# Patient Record
Sex: Male | Born: 1950
Health system: Southern US, Community
[De-identification: ages and names within clinical notes are randomized; demographics above are authoritative.]

## PROBLEM LIST (undated history)

## (undated) DIAGNOSIS — I5042 Chronic combined systolic (congestive) and diastolic (congestive) heart failure: Secondary | ICD-10-CM

## (undated) DIAGNOSIS — R399 Unspecified symptoms and signs involving the genitourinary system: Secondary | ICD-10-CM

## (undated) DIAGNOSIS — J449 Chronic obstructive pulmonary disease, unspecified: Secondary | ICD-10-CM

## (undated) DIAGNOSIS — H9190 Unspecified hearing loss, unspecified ear: Secondary | ICD-10-CM

## (undated) DIAGNOSIS — K219 Gastro-esophageal reflux disease without esophagitis: Secondary | ICD-10-CM

## (undated) DIAGNOSIS — Z9221 Personal history of antineoplastic chemotherapy: Secondary | ICD-10-CM

## (undated) DIAGNOSIS — I2510 Atherosclerotic heart disease of native coronary artery without angina pectoris: Secondary | ICD-10-CM

## (undated) DIAGNOSIS — E785 Hyperlipidemia, unspecified: Secondary | ICD-10-CM

## (undated) DIAGNOSIS — F1011 Alcohol abuse, in remission: Secondary | ICD-10-CM

## (undated) DIAGNOSIS — Z87898 Personal history of other specified conditions: Secondary | ICD-10-CM

## (undated) DIAGNOSIS — C159 Malignant neoplasm of esophagus, unspecified: Secondary | ICD-10-CM

## (undated) DIAGNOSIS — I10 Essential (primary) hypertension: Secondary | ICD-10-CM

## (undated) DIAGNOSIS — C61 Malignant neoplasm of prostate: Secondary | ICD-10-CM

## (undated) DIAGNOSIS — R51 Headache: Secondary | ICD-10-CM

## (undated) DIAGNOSIS — I255 Ischemic cardiomyopathy: Secondary | ICD-10-CM

## (undated) DIAGNOSIS — N281 Cyst of kidney, acquired: Secondary | ICD-10-CM

## (undated) DIAGNOSIS — R490 Dysphonia: Secondary | ICD-10-CM

## (undated) DIAGNOSIS — Z923 Personal history of irradiation: Secondary | ICD-10-CM

## (undated) DIAGNOSIS — R519 Headache, unspecified: Secondary | ICD-10-CM

---

## 1999-05-05 ENCOUNTER — Emergency Department (HOSPITAL_COMMUNITY): Admission: EM | Admit: 1999-05-05 | Discharge: 1999-05-05 | Payer: Self-pay | Admitting: Emergency Medicine

## 1999-05-05 ENCOUNTER — Encounter: Payer: Self-pay | Admitting: Emergency Medicine

## 1999-05-12 ENCOUNTER — Encounter: Admission: RE | Admit: 1999-05-12 | Discharge: 1999-05-12 | Payer: Self-pay | Admitting: Internal Medicine

## 1999-05-29 ENCOUNTER — Encounter: Payer: Self-pay | Admitting: Internal Medicine

## 1999-05-29 ENCOUNTER — Ambulatory Visit (HOSPITAL_COMMUNITY): Admission: RE | Admit: 1999-05-29 | Discharge: 1999-05-29 | Payer: Self-pay | Admitting: Internal Medicine

## 1999-05-29 ENCOUNTER — Encounter: Admission: RE | Admit: 1999-05-29 | Discharge: 1999-05-29 | Payer: Self-pay | Admitting: Internal Medicine

## 1999-09-01 DIAGNOSIS — Z87898 Personal history of other specified conditions: Secondary | ICD-10-CM

## 1999-09-01 HISTORY — DX: Personal history of other specified conditions: Z87.898

## 1999-10-03 ENCOUNTER — Encounter: Admission: RE | Admit: 1999-10-03 | Discharge: 1999-10-03 | Payer: Self-pay | Admitting: Internal Medicine

## 1999-11-03 ENCOUNTER — Encounter: Admission: RE | Admit: 1999-11-03 | Discharge: 1999-11-03 | Payer: Self-pay | Admitting: Internal Medicine

## 1999-11-17 ENCOUNTER — Encounter: Admission: RE | Admit: 1999-11-17 | Discharge: 1999-11-17 | Payer: Self-pay | Admitting: Internal Medicine

## 2000-08-26 ENCOUNTER — Encounter: Payer: Self-pay | Admitting: Emergency Medicine

## 2000-08-26 ENCOUNTER — Emergency Department (HOSPITAL_COMMUNITY): Admission: EM | Admit: 2000-08-26 | Discharge: 2000-08-26 | Payer: Self-pay | Admitting: Emergency Medicine

## 2000-09-14 ENCOUNTER — Emergency Department (HOSPITAL_COMMUNITY): Admission: EM | Admit: 2000-09-14 | Discharge: 2000-09-14 | Payer: Self-pay | Admitting: Emergency Medicine

## 2000-11-23 ENCOUNTER — Encounter: Payer: Self-pay | Admitting: Emergency Medicine

## 2000-11-23 ENCOUNTER — Inpatient Hospital Stay (HOSPITAL_COMMUNITY): Admission: EM | Admit: 2000-11-23 | Discharge: 2000-11-27 | Payer: Self-pay | Admitting: Emergency Medicine

## 2000-12-21 ENCOUNTER — Encounter: Admission: RE | Admit: 2000-12-21 | Discharge: 2000-12-21 | Payer: Self-pay | Admitting: Hematology and Oncology

## 2001-01-13 ENCOUNTER — Encounter: Admission: RE | Admit: 2001-01-13 | Discharge: 2001-01-13 | Payer: Self-pay | Admitting: Internal Medicine

## 2001-10-13 ENCOUNTER — Encounter: Payer: Self-pay | Admitting: Emergency Medicine

## 2001-10-13 ENCOUNTER — Inpatient Hospital Stay (HOSPITAL_COMMUNITY): Admission: EM | Admit: 2001-10-13 | Discharge: 2001-10-18 | Payer: Self-pay | Admitting: Emergency Medicine

## 2001-10-24 ENCOUNTER — Encounter: Admission: RE | Admit: 2001-10-24 | Discharge: 2001-10-24 | Payer: Self-pay

## 2001-12-09 ENCOUNTER — Encounter: Payer: Self-pay | Admitting: Internal Medicine

## 2001-12-09 ENCOUNTER — Inpatient Hospital Stay (HOSPITAL_COMMUNITY): Admission: RE | Admit: 2001-12-09 | Discharge: 2001-12-10 | Payer: Self-pay | Admitting: Internal Medicine

## 2001-12-09 ENCOUNTER — Encounter: Admission: RE | Admit: 2001-12-09 | Discharge: 2001-12-09 | Payer: Self-pay | Admitting: Internal Medicine

## 2001-12-09 ENCOUNTER — Ambulatory Visit (HOSPITAL_COMMUNITY): Admission: RE | Admit: 2001-12-09 | Discharge: 2001-12-09 | Payer: Self-pay | Admitting: Internal Medicine

## 2001-12-19 ENCOUNTER — Encounter: Admission: RE | Admit: 2001-12-19 | Discharge: 2001-12-19 | Payer: Self-pay | Admitting: Internal Medicine

## 2002-01-23 ENCOUNTER — Encounter: Admission: RE | Admit: 2002-01-23 | Discharge: 2002-01-23 | Payer: Self-pay | Admitting: Internal Medicine

## 2003-01-13 ENCOUNTER — Emergency Department (HOSPITAL_COMMUNITY): Admission: EM | Admit: 2003-01-13 | Discharge: 2003-01-13 | Payer: Self-pay

## 2003-02-02 ENCOUNTER — Encounter: Admission: RE | Admit: 2003-02-02 | Discharge: 2003-02-02 | Payer: Self-pay | Admitting: Internal Medicine

## 2003-04-30 ENCOUNTER — Emergency Department (HOSPITAL_COMMUNITY): Admission: EM | Admit: 2003-04-30 | Discharge: 2003-04-30 | Payer: Self-pay | Admitting: Emergency Medicine

## 2003-05-07 ENCOUNTER — Emergency Department (HOSPITAL_COMMUNITY): Admission: EM | Admit: 2003-05-07 | Discharge: 2003-05-07 | Payer: Self-pay | Admitting: Emergency Medicine

## 2003-10-15 ENCOUNTER — Emergency Department (HOSPITAL_COMMUNITY): Admission: EM | Admit: 2003-10-15 | Discharge: 2003-10-15 | Payer: Self-pay | Admitting: Emergency Medicine

## 2004-06-05 ENCOUNTER — Ambulatory Visit: Payer: Self-pay | Admitting: Internal Medicine

## 2004-06-12 ENCOUNTER — Ambulatory Visit: Payer: Self-pay | Admitting: Internal Medicine

## 2004-12-25 ENCOUNTER — Emergency Department (HOSPITAL_COMMUNITY): Admission: EM | Admit: 2004-12-25 | Discharge: 2004-12-25 | Payer: Self-pay | Admitting: Family Medicine

## 2005-01-23 ENCOUNTER — Emergency Department (HOSPITAL_COMMUNITY): Admission: EM | Admit: 2005-01-23 | Discharge: 2005-01-23 | Payer: Self-pay | Admitting: Emergency Medicine

## 2005-02-11 ENCOUNTER — Encounter: Admission: RE | Admit: 2005-02-11 | Discharge: 2005-02-11 | Payer: Self-pay | Admitting: Internal Medicine

## 2009-01-07 ENCOUNTER — Emergency Department (HOSPITAL_COMMUNITY): Admission: EM | Admit: 2009-01-07 | Discharge: 2009-01-07 | Payer: Self-pay | Admitting: Emergency Medicine

## 2009-04-25 ENCOUNTER — Encounter: Admission: RE | Admit: 2009-04-25 | Discharge: 2009-04-25 | Payer: Self-pay | Admitting: Internal Medicine

## 2009-05-02 ENCOUNTER — Encounter: Payer: Self-pay | Admitting: Gastroenterology

## 2009-05-07 ENCOUNTER — Encounter: Admission: RE | Admit: 2009-05-07 | Discharge: 2009-05-07 | Payer: Self-pay | Admitting: Gastroenterology

## 2009-05-14 ENCOUNTER — Encounter: Payer: Self-pay | Admitting: Gastroenterology

## 2009-05-14 ENCOUNTER — Telehealth (INDEPENDENT_AMBULATORY_CARE_PROVIDER_SITE_OTHER): Payer: Self-pay | Admitting: *Deleted

## 2009-05-14 DIAGNOSIS — R933 Abnormal findings on diagnostic imaging of other parts of digestive tract: Secondary | ICD-10-CM

## 2009-05-15 ENCOUNTER — Ambulatory Visit: Payer: Self-pay | Admitting: Gastroenterology

## 2009-05-15 ENCOUNTER — Ambulatory Visit (HOSPITAL_COMMUNITY): Admission: RE | Admit: 2009-05-15 | Discharge: 2009-05-15 | Payer: Self-pay | Admitting: Gastroenterology

## 2009-05-20 ENCOUNTER — Telehealth: Payer: Self-pay | Admitting: Gastroenterology

## 2009-05-22 ENCOUNTER — Ambulatory Visit: Payer: Self-pay | Admitting: Hematology & Oncology

## 2009-05-28 ENCOUNTER — Encounter: Payer: Self-pay | Admitting: Gastroenterology

## 2009-05-28 LAB — COMPREHENSIVE METABOLIC PANEL
AST: 24 U/L (ref 0–37)
Albumin: 4.6 g/dL (ref 3.5–5.2)
BUN: 17 mg/dL (ref 6–23)
Calcium: 10.1 mg/dL (ref 8.4–10.5)
Chloride: 98 mEq/L (ref 96–112)
Glucose, Bld: 104 mg/dL — ABNORMAL HIGH (ref 70–99)
Potassium: 5 mEq/L (ref 3.5–5.3)
Sodium: 136 mEq/L (ref 135–145)
Total Protein: 7.7 g/dL (ref 6.0–8.3)

## 2009-05-28 LAB — CBC WITH DIFFERENTIAL (CANCER CENTER ONLY)
BASO%: 1 % (ref 0.0–2.0)
Eosinophils Absolute: 0.1 10*3/uL (ref 0.0–0.5)
HCT: 42.3 % (ref 38.7–49.9)
LYMPH#: 1.6 10*3/uL (ref 0.9–3.3)
LYMPH%: 42.9 % (ref 14.0–48.0)
MCV: 87 fL (ref 82–98)
MONO#: 0.4 10*3/uL (ref 0.1–0.9)
NEUT%: 42.4 % (ref 40.0–80.0)
Platelets: 218 10*3/uL (ref 145–400)
RBC: 4.87 10*6/uL (ref 4.20–5.70)
WBC: 3.6 10*3/uL — ABNORMAL LOW (ref 4.0–10.0)

## 2009-05-30 ENCOUNTER — Ambulatory Visit: Admission: RE | Admit: 2009-05-30 | Discharge: 2009-07-21 | Payer: Self-pay | Admitting: Radiation Oncology

## 2009-05-30 ENCOUNTER — Ambulatory Visit (HOSPITAL_COMMUNITY): Admission: RE | Admit: 2009-05-30 | Discharge: 2009-05-30 | Payer: Self-pay | Admitting: Hematology & Oncology

## 2009-05-31 ENCOUNTER — Encounter: Payer: Self-pay | Admitting: Gastroenterology

## 2009-06-03 ENCOUNTER — Ambulatory Visit (HOSPITAL_COMMUNITY): Admission: RE | Admit: 2009-06-03 | Discharge: 2009-06-03 | Payer: Self-pay | Admitting: Hematology & Oncology

## 2009-06-13 LAB — CBC WITH DIFFERENTIAL (CANCER CENTER ONLY)
BASO%: 0.2 % (ref 0.0–2.0)
Eosinophils Absolute: 0 10*3/uL (ref 0.0–0.5)
MONO#: 0.3 10*3/uL (ref 0.1–0.9)
NEUT#: 1.2 10*3/uL — ABNORMAL LOW (ref 1.5–6.5)
Platelets: 191 10*3/uL (ref 145–400)
RBC: 4.84 10*6/uL (ref 4.20–5.70)
RDW: 11.7 % (ref 10.5–14.6)
WBC: 2.3 10*3/uL — ABNORMAL LOW (ref 4.0–10.0)

## 2009-06-13 LAB — COMPREHENSIVE METABOLIC PANEL
Alkaline Phosphatase: 40 U/L (ref 39–117)
BUN: 20 mg/dL (ref 6–23)
CO2: 28 mEq/L (ref 19–32)
Glucose, Bld: 102 mg/dL — ABNORMAL HIGH (ref 70–99)
Total Bilirubin: 0.6 mg/dL (ref 0.3–1.2)

## 2009-07-02 ENCOUNTER — Ambulatory Visit: Payer: Self-pay | Admitting: Hematology & Oncology

## 2009-07-03 LAB — CMP (CANCER CENTER ONLY)
ALT(SGPT): 21 U/L (ref 10–47)
AST: 22 U/L (ref 11–38)
Alkaline Phosphatase: 53 U/L (ref 26–84)
Sodium: 139 mEq/L (ref 128–145)
Total Bilirubin: 0.4 mg/dl (ref 0.20–1.60)
Total Protein: 7.2 g/dL (ref 6.4–8.1)

## 2009-07-03 LAB — CBC WITH DIFFERENTIAL (CANCER CENTER ONLY)
BASO%: 0.5 % (ref 0.0–2.0)
Eosinophils Absolute: 0.1 10*3/uL (ref 0.0–0.5)
LYMPH#: 0.3 10*3/uL — ABNORMAL LOW (ref 0.9–3.3)
LYMPH%: 12.1 % — ABNORMAL LOW (ref 14.0–48.0)
MCV: 85 fL (ref 82–98)
MONO#: 0.4 10*3/uL (ref 0.1–0.9)
NEUT#: 1.6 10*3/uL (ref 1.5–6.5)
Platelets: 236 10*3/uL (ref 145–400)
RBC: 4.51 10*6/uL (ref 4.20–5.70)
RDW: 12.8 % (ref 10.5–14.6)
WBC: 2.4 10*3/uL — ABNORMAL LOW (ref 4.0–10.0)

## 2009-07-08 ENCOUNTER — Ambulatory Visit: Payer: Self-pay | Admitting: Hematology

## 2009-08-01 ENCOUNTER — Ambulatory Visit: Payer: Self-pay | Admitting: Hematology & Oncology

## 2009-08-02 LAB — CBC WITH DIFFERENTIAL (CANCER CENTER ONLY)
BASO%: 1 % (ref 0.0–2.0)
EOS%: 0.8 % (ref 0.0–7.0)
Eosinophils Absolute: 0 10*3/uL (ref 0.0–0.5)
LYMPH%: 31.9 % (ref 14.0–48.0)
MCH: 29.9 pg (ref 28.0–33.4)
MCV: 86 fL (ref 82–98)
MONO%: 20 % — ABNORMAL HIGH (ref 0.0–13.0)
NEUT#: 2.4 10*3/uL (ref 1.5–6.5)
Platelets: 433 10*3/uL — ABNORMAL HIGH (ref 145–400)
RBC: 4.17 10*6/uL — ABNORMAL LOW (ref 4.20–5.70)
RDW: 14.3 % (ref 10.5–14.6)

## 2009-08-02 LAB — COMPREHENSIVE METABOLIC PANEL
ALT: 16 U/L (ref 0–53)
AST: 22 U/L (ref 0–37)
BUN: 19 mg/dL (ref 6–23)
Creatinine, Ser: 1.01 mg/dL (ref 0.40–1.50)
Total Bilirubin: 0.5 mg/dL (ref 0.3–1.2)

## 2009-08-02 LAB — TECHNOLOGIST REVIEW CHCC SATELLITE

## 2009-09-02 ENCOUNTER — Ambulatory Visit (HOSPITAL_COMMUNITY): Admission: RE | Admit: 2009-09-02 | Discharge: 2009-09-02 | Payer: Self-pay | Admitting: Hematology & Oncology

## 2009-09-17 ENCOUNTER — Ambulatory Visit: Payer: Self-pay | Admitting: Hematology & Oncology

## 2009-09-18 LAB — COMPREHENSIVE METABOLIC PANEL
AST: 26 U/L (ref 0–37)
Calcium: 9.5 mg/dL (ref 8.4–10.5)
Chloride: 95 mEq/L — ABNORMAL LOW (ref 96–112)
Creatinine, Ser: 0.95 mg/dL (ref 0.40–1.50)

## 2009-09-18 LAB — PREALBUMIN: Prealbumin: 15 mg/dL — ABNORMAL LOW (ref 18.0–45.0)

## 2009-09-18 LAB — CBC WITH DIFFERENTIAL (CANCER CENTER ONLY)
BASO#: 0 10*3/uL (ref 0.0–0.2)
BASO%: 0.4 % (ref 0.0–2.0)
EOS%: 3.8 % (ref 0.0–7.0)
Eosinophils Absolute: 0.1 10*3/uL (ref 0.0–0.5)
HCT: 40.5 % (ref 38.7–49.9)
HGB: 13.7 g/dL (ref 13.0–17.1)
MCV: 91 fL (ref 82–98)
MONO%: 14.1 % — ABNORMAL HIGH (ref 0.0–13.0)
NEUT#: 1.7 10*3/uL (ref 1.5–6.5)
RBC: 4.47 10*6/uL (ref 4.20–5.70)
WBC: 3.5 10*3/uL — ABNORMAL LOW (ref 4.0–10.0)

## 2009-10-04 ENCOUNTER — Ambulatory Visit: Payer: Self-pay | Admitting: Thoracic Surgery

## 2009-10-08 ENCOUNTER — Ambulatory Visit: Admission: RE | Admit: 2009-10-08 | Discharge: 2009-10-08 | Payer: Self-pay | Admitting: Thoracic Surgery

## 2009-10-08 ENCOUNTER — Telehealth (INDEPENDENT_AMBULATORY_CARE_PROVIDER_SITE_OTHER): Payer: Self-pay

## 2009-10-09 ENCOUNTER — Ambulatory Visit: Payer: Self-pay

## 2009-10-09 ENCOUNTER — Ambulatory Visit: Payer: Self-pay | Admitting: Cardiovascular Disease

## 2009-10-09 ENCOUNTER — Encounter (HOSPITAL_COMMUNITY): Admission: RE | Admit: 2009-10-09 | Discharge: 2009-12-04 | Payer: Self-pay | Admitting: Thoracic Surgery

## 2009-10-09 HISTORY — PX: CARDIOVASCULAR STRESS TEST: SHX262

## 2009-10-11 ENCOUNTER — Ambulatory Visit: Payer: Self-pay | Admitting: Thoracic Surgery

## 2009-10-11 ENCOUNTER — Telehealth (INDEPENDENT_AMBULATORY_CARE_PROVIDER_SITE_OTHER): Payer: Self-pay | Admitting: *Deleted

## 2009-10-18 ENCOUNTER — Ambulatory Visit: Payer: Self-pay | Admitting: Thoracic Surgery

## 2009-11-06 ENCOUNTER — Ambulatory Visit: Payer: Self-pay | Admitting: Hematology & Oncology

## 2009-11-07 LAB — COMPREHENSIVE METABOLIC PANEL
AST: 23 U/L (ref 0–37)
Albumin: 4.4 g/dL (ref 3.5–5.2)
Alkaline Phosphatase: 49 U/L (ref 39–117)
CO2: 27 mEq/L (ref 19–32)
Calcium: 9.7 mg/dL (ref 8.4–10.5)
Chloride: 96 mEq/L (ref 96–112)
Creatinine, Ser: 1.19 mg/dL (ref 0.40–1.50)
Glucose, Bld: 93 mg/dL (ref 70–99)
Sodium: 134 mEq/L — ABNORMAL LOW (ref 135–145)
Total Protein: 7.1 g/dL (ref 6.0–8.3)

## 2009-11-07 LAB — CBC WITH DIFFERENTIAL (CANCER CENTER ONLY)
BASO#: 0 10*3/uL (ref 0.0–0.2)
BASO%: 0.4 % (ref 0.0–2.0)
EOS%: 2.4 % (ref 0.0–7.0)
HGB: 14.2 g/dL (ref 13.0–17.1)
LYMPH#: 1.2 10*3/uL (ref 0.9–3.3)
MCV: 90 fL (ref 82–98)
MONO#: 0.2 10*3/uL (ref 0.1–0.9)
RDW: 11.1 % (ref 10.5–14.6)
WBC: 2.4 10*3/uL — ABNORMAL LOW (ref 4.0–10.0)

## 2010-01-03 ENCOUNTER — Ambulatory Visit (HOSPITAL_COMMUNITY): Admission: RE | Admit: 2010-01-03 | Discharge: 2010-01-03 | Payer: Self-pay | Admitting: Hematology & Oncology

## 2010-01-08 ENCOUNTER — Ambulatory Visit: Payer: Self-pay | Admitting: Hematology & Oncology

## 2010-01-09 LAB — COMPREHENSIVE METABOLIC PANEL
ALT: 24 U/L (ref 0–53)
AST: 24 U/L (ref 0–37)
Alkaline Phosphatase: 37 U/L — ABNORMAL LOW (ref 39–117)
CO2: 28 mEq/L (ref 19–32)
Chloride: 100 mEq/L (ref 96–112)
Creatinine, Ser: 1.17 mg/dL (ref 0.40–1.50)
Glucose, Bld: 88 mg/dL (ref 70–99)
Potassium: 4.6 mEq/L (ref 3.5–5.3)
Sodium: 138 mEq/L (ref 135–145)
Total Bilirubin: 0.3 mg/dL (ref 0.3–1.2)

## 2010-01-09 LAB — CBC WITH DIFFERENTIAL (CANCER CENTER ONLY)
BASO#: 0 10*3/uL (ref 0.0–0.2)
EOS%: 2.9 % (ref 0.0–7.0)
Eosinophils Absolute: 0.1 10*3/uL (ref 0.0–0.5)
HCT: 41.3 % (ref 38.7–49.9)
HGB: 13.7 g/dL (ref 13.0–17.1)
MCHC: 33.2 g/dL (ref 32.0–35.9)
MCV: 87 fL (ref 82–98)
NEUT%: 50.7 % (ref 40.0–80.0)
Platelets: 199 10*3/uL (ref 145–400)
WBC: 2.8 10*3/uL — ABNORMAL LOW (ref 4.0–10.0)

## 2010-01-10 ENCOUNTER — Encounter: Admission: RE | Admit: 2010-01-10 | Discharge: 2010-01-10 | Payer: Self-pay | Admitting: Thoracic Surgery

## 2010-01-10 ENCOUNTER — Ambulatory Visit: Payer: Self-pay | Admitting: Thoracic Surgery

## 2010-04-17 ENCOUNTER — Ambulatory Visit (HOSPITAL_COMMUNITY): Admission: RE | Admit: 2010-04-17 | Discharge: 2010-04-17 | Payer: Self-pay | Admitting: Hematology & Oncology

## 2010-04-23 ENCOUNTER — Ambulatory Visit: Payer: Self-pay | Admitting: Hematology & Oncology

## 2010-04-25 LAB — COMPREHENSIVE METABOLIC PANEL
ALT: 22 U/L (ref 0–53)
Alkaline Phosphatase: 32 U/L — ABNORMAL LOW (ref 39–117)
CO2: 30 mEq/L (ref 19–32)
Total Protein: 7.2 g/dL (ref 6.0–8.3)

## 2010-04-25 LAB — CBC WITH DIFFERENTIAL (CANCER CENTER ONLY)
BASO#: 0 10*3/uL (ref 0.0–0.2)
Eosinophils Absolute: 0.1 10*3/uL (ref 0.0–0.5)
HGB: 14.1 g/dL (ref 13.0–17.1)
MCH: 29.5 pg (ref 28.0–33.4)
MCHC: 33.1 g/dL (ref 32.0–35.9)
NEUT#: 0.9 10*3/uL — ABNORMAL LOW (ref 1.5–6.5)
NEUT%: 35 % — ABNORMAL LOW (ref 40.0–80.0)
Platelets: 212 10*3/uL (ref 145–400)
RBC: 4.79 10*6/uL (ref 4.20–5.70)
RDW: 11.5 % (ref 10.5–14.6)
WBC: 2.4 10*3/uL — ABNORMAL LOW (ref 4.0–10.0)

## 2010-04-29 ENCOUNTER — Ambulatory Visit: Payer: Self-pay | Admitting: Thoracic Surgery

## 2010-07-18 ENCOUNTER — Encounter: Payer: Self-pay | Admitting: Gastroenterology

## 2010-08-08 ENCOUNTER — Ambulatory Visit (HOSPITAL_COMMUNITY)
Admission: RE | Admit: 2010-08-08 | Discharge: 2010-08-08 | Payer: Self-pay | Source: Home / Self Care | Attending: Hematology & Oncology | Admitting: Hematology & Oncology

## 2010-08-14 ENCOUNTER — Ambulatory Visit: Payer: Self-pay | Admitting: Hematology & Oncology

## 2010-08-15 LAB — COMPREHENSIVE METABOLIC PANEL
AST: 27 U/L (ref 0–37)
Albumin: 4.1 g/dL (ref 3.5–5.2)
Alkaline Phosphatase: 27 U/L — ABNORMAL LOW (ref 39–117)
Potassium: 4.3 mEq/L (ref 3.5–5.3)
Sodium: 136 mEq/L (ref 135–145)
Total Protein: 6.8 g/dL (ref 6.0–8.3)

## 2010-08-15 LAB — CBC WITH DIFFERENTIAL (CANCER CENTER ONLY)
BASO#: 0 10*3/uL (ref 0.0–0.2)
EOS%: 4.6 % (ref 0.0–7.0)
Eosinophils Absolute: 0.1 10*3/uL (ref 0.0–0.5)
LYMPH%: 38.1 % (ref 14.0–48.0)
MCH: 29 pg (ref 28.0–33.4)
MONO#: 0.5 10*3/uL (ref 0.1–0.9)

## 2010-08-15 LAB — TECHNOLOGIST REVIEW CHCC SATELLITE

## 2010-08-20 ENCOUNTER — Ambulatory Visit (HOSPITAL_COMMUNITY)
Admission: RE | Admit: 2010-08-20 | Discharge: 2010-08-20 | Payer: Self-pay | Source: Home / Self Care | Attending: Hematology & Oncology | Admitting: Hematology & Oncology

## 2010-09-21 ENCOUNTER — Encounter: Payer: Self-pay | Admitting: Internal Medicine

## 2010-09-30 NOTE — Assessment & Plan Note (Signed)
Summary: Cardiology Nuclear Study  Nuclear Med Background Indications for Stress Test: Evaluation for Ischemia, Surgical Clearance  Indications Comments: Pending Total Esophagectomy(CA) by Dr. Norton Blizzard.  History: History of Chemo  History Comments: NO DOCUMENTED CAD  Symptoms: Chest Pain  Symptoms Comments: Last episode of CP:3-4 months ago, when "I quit smoking"   Nuclear Pre-Procedure Cardiac Risk Factors: History of Smoking, Hypertension, Lipids Caffeine/Decaff Intake: None NPO After: 8:30 AM Lungs: Clear IV 0.9% NS with Angio Cath: 22g     IV Site: (R) AC IV Started by: Irean Hong RN Chest Size (in) 36     Height (in): 66 Weight (lb): 132 BMI: 21.38 Tech Comments: The patient very active, works in [employer], and wants to walk treadmill.   Patsy Edwards,RN. AM Meds. taken today.  Nuclear Med Study 1 or 2 day study:  1 day     Stress Test Type:  Stress Reading MD:  Charlton Haws, MD     Referring MD:  Karle Plumber, MD Resting Radionuclide:  Technetium 63m Tetrofosmin     Resting Radionuclide Dose:  11.0 mCi  Stress Radionuclide:  Technetium 69m Tetrofosmin     Stress Radionuclide Dose:  33.0 mCi   Stress Protocol Exercise Time (min):  9:31 min     Max HR:  142 bpm     Predicted Max HR:  162 bpm  Max Systolic BP: 211 mm Hg     Percent Max HR:  87.65 %     METS: 10.6 Rate Pressure Product:  [address]    Stress Test Technologist:  Rea [employer] CMA-N     Nuclear Technologist:  Burna Mortimer Deal RT-N  Rest Procedure  Myocardial perfusion imaging was performed at rest 45 minutes following the intravenous administration of Myoview Technetium 82m Tetrofosmin.  Stress Procedure  The patient exercised for 9:31.  The patient stopped due to fatigue and a hypertensive response, 211/108.  He denied any chest pain.  There were no significant ST-T wave changes, only occasional PVC's and PAC's.  Myoview was injected at peak exercise and myocardial perfusion imaging was performed  after a brief delay.  QPS Raw Data Images:  Normal; no motion artifact; normal heart/lung ratio. Stress Images:  NI: Uniform and normal uptake of tracer in all myocardial segments. Rest Images:  Normal homogeneous uptake in all areas of the myocardium. Subtraction (SDS):  Normal Transient Ischemic Dilatation:  1.05  (Normal <1.22)  Lung/Heart Ratio:  .31  (Normal <0.45)  Quantitative Gated Spect Images QGS EDV:  80 ml QGS ESV:  35 ml QGS EF:  57 % QGS cine images:  normal  Findings Normal nuclear study      Overall Impression  Exercise Capacity: Good exercise capacity. BP Response: Hypertensive blood pressure response. Clinical Symptoms: No chest pain ECG Impression: No significant ST segment change suggestive of ischemia. Overall Impression: Normal stress nuclear study. Overall Impression Comments: Hypertensive response to exercise

## 2010-09-30 NOTE — Progress Notes (Signed)
Summary: Nuc. Pre-Procedure  Phone Note Outgoing Call Call back at Work Phone [phone]   Call placed by: Irean Hong, RN,  October 08, 2009 2:06 PM Summary of Call: Reviewed information on Myoview Information Sheet (see scanned document for further details).  Spoke with patient.     Nuclear Med Background Indications for Stress Test: Evaluation for Ischemia, Surgical Clearance  Indications Comments: Pending Total Esophagectomy(CA) by Dr. Norton Blizzard.  History: History of Chemo      Nuclear Pre-Procedure Cardiac Risk Factors: History of Smoking, Hypertension, Lipids

## 2010-09-30 NOTE — Progress Notes (Signed)
  Phone Note From Other Clinic   Caller: Joleen/Dr.Burney Details for Reason: Pt.Information Initial call taken by: Marijean Niemann Stress over to [phone] [facility]  October 11, 2009 3:52 PM

## 2010-09-30 NOTE — Letter (Signed)
Summary: [facility]  [facility]   Imported By: Sherian Rein 07/31/2010 14:47:27  _____________________________________________________________________  External Attachment:    Type:   Image     Comment:   External Document

## 2010-11-16 LAB — GLUCOSE, CAPILLARY: Glucose-Capillary: 94 mg/dL (ref 70–99)

## 2010-12-18 ENCOUNTER — Encounter (HOSPITAL_BASED_OUTPATIENT_CLINIC_OR_DEPARTMENT_OTHER): Payer: Medicaid Other | Admitting: Hematology & Oncology

## 2010-12-18 ENCOUNTER — Other Ambulatory Visit: Payer: Self-pay | Admitting: Hematology & Oncology

## 2010-12-18 DIAGNOSIS — C154 Malignant neoplasm of middle third of esophagus: Secondary | ICD-10-CM

## 2010-12-18 LAB — CBC WITH DIFFERENTIAL (CANCER CENTER ONLY)
Eosinophils Absolute: 0.2 10*3/uL (ref 0.0–0.5)
LYMPH#: 1.2 10*3/uL (ref 0.9–3.3)
LYMPH%: 38.8 % (ref 14.0–48.0)
MCV: 85 fL (ref 82–98)
MONO#: 0.5 10*3/uL (ref 0.1–0.9)
Platelets: 181 10*3/uL (ref 145–400)
RBC: 4.94 10*6/uL (ref 4.20–5.70)
WBC: 3.2 10*3/uL — ABNORMAL LOW (ref 4.0–10.0)

## 2010-12-18 LAB — COMPREHENSIVE METABOLIC PANEL
Albumin: 4.3 g/dL (ref 3.5–5.2)
CO2: 27 mEq/L (ref 19–32)
Calcium: 9.9 mg/dL (ref 8.4–10.5)
Chloride: 100 mEq/L (ref 96–112)
Glucose, Bld: 92 mg/dL (ref 70–99)
Potassium: 4.4 mEq/L (ref 3.5–5.3)
Sodium: 137 mEq/L (ref 135–145)
Total Bilirubin: 0.3 mg/dL (ref 0.3–1.2)
Total Protein: 7 g/dL (ref 6.0–8.3)

## 2010-12-18 LAB — LACTATE DEHYDROGENASE: LDH: 196 U/L (ref 94–250)

## 2011-01-13 NOTE — Letter (Signed)
October 18, 2009   Rose Phi. Myna Hidalgo, MD  [address] [facility] [address]  [place], [place] [address]   Re:  Collin Young, Collin Young              DOB:  [birth year]   Dear Theron Arista,   I saw the patient back today after he had a repeat endoscopy by Dr.  Randa Evens, now it is completely negative with no evidence of recurrence of  the cancer.  We talked about him at cancer conference and the consensus  was probably should go ahead and resect him given that he had only  received 5400 Gy of radiation rather than the normal 6000-7000 Gy.  I  discussed this with him in detail, and the patient after discussing with  surgery he is really reluctant to have the surgery.  He actual will call  you and discuss this with you.  His blood pressure was 165/94, pulse of  86, respirations 18, sats were 98%.  Given that he is a little past our  normal window regarding the surgery, may be just observation is the way  to go.  I have rescheduled him to see me again in 3 months with a CT  scan but will be happy to see him again before then.   Sincerely,   [name], M.D.  Electronically Signed   DPB/MEDQ  D:  10/18/2009  T:  10/19/2009  Job:  [id number]   cc:   Fayrene Fearing L. Malon Kindle., M.D.

## 2011-01-13 NOTE — Letter (Signed)
April 29, 2010   Rose Phi. Myna Hidalgo, MD  [address] [facility] [address]  [place], [place] [address]   Re:  RISHAWN, WALCK              DOB:  1950/10/07   Dear Theron Arista:   I saw the patient back today and reviewed his PET scan, which looks like  he has no evidence of recurrence of his cancer.  So far, he has got a  good response to the radiation and chemotherapy.  I will let you  continue to follow him.  If I need to see him again, I will be happy to  see him at anytime.  I appreciate the opportunity of participating in  the patient's care.  His blood pressure was 123/76, pulse 87,  respirations 18, and sats were 98%.   [name], M.D.  Electronically Signed   DPB/MEDQ  D:  04/29/2010  T:  04/30/2010  Job:  [id number]

## 2011-01-13 NOTE — Assessment & Plan Note (Signed)
OFFICE VISIT   Collin Young, Collin Young  DOB:  1950/11/25                                        October 11, 2009  CHART #:  [id number]   The patient came for followup today and his Cardiolite was negative.  His pulmonary function tests look good from a standpoint of infection.  The problem is he did not get his endoscopy done by Dr. Randa Evens due to  some miscommunication.  We will plan to get that done and then I will  see him back again.  His blood pressure is 122/74, pulse 88,  respirations 16, sats were 98%.   [name], M.D.  Electronically Signed   DPB/MEDQ  D:  10/11/2009  T:  10/12/2009  Job:  [id number]

## 2011-01-13 NOTE — Letter (Signed)
Jan 10, 2010   Rose Phi. Myna Hidalgo, MD  [address] [facility] [address]  [place], [place] [address]   Re:  [name], [name]              DOB:  1951/05/07   Dear Cindee Lame,   I saw the patient back today and I found that there is no evidence of  recurrence of his cancer on his CT scan.  We will plan to get a PET scan  on him in August.  His blood pressure is 153/85, pulse 59, respirations  18, sats were 98%.  I will see him back again at that time.   [name], M.D.  Electronically Signed   DPB/MEDQ  D:  01/10/2010  T:  01/11/2010  Job:  [id number]

## 2011-01-13 NOTE — Letter (Signed)
October 04, 2009   Rose Phi. Myna Hidalgo, MD  [address] [facility] [address]  [place], [place] [address]   Re:  RESHAD, SAAB              DOB:  03/26/1951   Dear Theron Arista:   I saw the patient today.  This patient was found in May 09, 2009,  was admitted with weight loss and dysphagia and underwent an endoscopy  by Dr. 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  A PET scan was positive in this area as well two 4L nodes.  He had an ultrasound by Dr. Christella Hartigan, did show this was 3 x 4 x 1.1 cm  that invaded the muscularis propria, so he was then referred and  underwent radiation and chemotherapy, which finished about 2 weeks ago.  He had a followup PET scan, which showed no activity in the  paraesophageal node, but there is still some mild activity in the mid  esophagus.  A PET scan was done approximately 1 month ago.  He is eating  well at the present time.   PAST MEDICAL HISTORY:  Significant for hypertension and hyperlipidemia.   ALLERGIES:  He has no allergies.   MEDICATIONS:  1. Lisinopril 12.5 mg daily.  2. Zantac 150 mg daily.  3. Simvastatin 20 mg daily.   FAMILY HISTORY:  Noncontributory.  It is positive for cancer.   SOCIAL HISTORY:  He is married.  He has 2 children.  Smoked for many  years, but quit smoking a couple of months ago.  Does not drink alcohol  on a regular basis.   REVIEW OF SYSTEMS:  VITAL SIGNS:  He is 136 pounds.  He is 5 feet 6  inches.  GENERAL:  His weight has been stable since completing his  treatment.  CARDIAC:  No angina or atrial fibrillation.  PULMONARY:  No  hemoptysis, cough, or asthma.  GI:  See history of present illness.  GU:  No kidney disease, dysuria, or frequent urination.  VASCULAR:  No  claudication, DVT, or TIAs.  NEUROLOGICAL:  No dizziness, headaches,  blackouts, or seizures.  MUSCULOSKELETAL:  No arthritis.  PSYCHIATRIC:  No depression or nervousness.  EYE/ENT:  No changes in eyesight or  hearing.   HEMATOLOGICAL:  No problems with bleeding, clotting disorders,  or anemia.   PHYSICAL EXAMINATION:  General:  He is a well-developed [place] American  male, in no acute distress.  Head, Eyes, Ears, Nose, and Throat:  Unremarkable.  Neck:  Supple without thyromegaly.  There is no  supraclavicular or axillary adenopathy.  Chest:  Clear to auscultation  and percussion.  Heart:  Regular sinus rhythm.  No murmurs.  Abdomen:  Soft.  There is no hepatosplenomegaly.  Extremities:  Pulses are 2+.  There is no clubbing or edema.  Neurological:  He is oriented x3.  Sensory and motor intact.  Cranial nerves intact.   Unfortunately, he is now over 2 months since completing his radiation  and chemotherapy, which is usually out of the REM when we like to  operate on them.  It is usually better to do this within 4-6 weeks post  treatment.  I discussed today the risk and benefits of surgery, saying  that he did have to have a total esophagectomy and then his stomach, we  used a conduit brought to his left neck.  I have explained to him that  there is a high possibility of recurrent cancer  with or without the  surgery.  He will discuss this with his family.  I will plan to get a  Cardiolite, pulmonary function test, and have Dr. Randa Evens rescope him.  I will see him back again after this has been done and then make a final  decision whether surgery would be beneficial or not.   Sincerely,   [name], M.D.  Electronically Signed   DPB/MEDQ  D:  10/04/2009  T:  10/05/2009  Job:  [id number]

## 2011-01-16 NOTE — Consult Note (Signed)
[place]. [facility]  Patient:    Collin Young, Collin Young Visit Number: [id number] MRN: [id number]          Service Type: MED Location: MICU 2110 01 Attending Physician:  Madaline Guthrie Dictated by:   Raynelle Jan Proc. Date: 10/13/01 Admit Date:  10/13/2001   CC:         Madaline Guthrie, M.D.  Genene Churn. Sherin Quarry, M.D.   Consultation Report  HISTORY OF PRESENT ILLNESS:  We have been asked to consult on this 60 year old gentleman with a history of a previous GI bleed secondary to Mallory-Weiss tear in March 2002, who presents to [facility] Emergency Department with a 1-day history of black, bloody, tarry stools and a single episode of hematemesis. The patient states that his stools started last evening and have totaled approximately 7 episodes in the last 12 hours. He complains of no abdominal pain or worsening GERD-type symptoms. He does have history of significant alcohol abuse in the past and does admit to recent alcohol use. He also notes a recent use of over-the-counter Alka Seltzer Plus containing an NSAID product. He states he has had some dysphagia symptoms x3 weeks as well as some occasional substernal and left-sided chest pain that occurs after coughing. He denies any recent nausea or vomiting. He denies any other recent GI symptoms.  PAST MEDICAL HISTORY: 1. GI bleed March 2002 secondary to Mallory-Weiss tear. 2. Polysubstance abuse with a history of DTs during his last admission. 3. Hypertension.  MEDICATIONS:  Include "blood pressure pills" which he has been out of for months.  ALLERGIES:  No known drug allergies.  PAST SURGICAL HISTORY:  None.  FAMILY HISTORY:  No history of any GI malignancies, peptic ulcer disease, or inflammatory bowel disorders. His mother does have a history of hypertension.  SOCIAL HISTORY:  Positive alcohol, tobacco and drug use, however, the patient denies any current drug use. He is employed in [employer], site.  REVIEW OF SYSTEMS:  Significant for a cough over the last 3 weeks, productive of yellow mucus. No fevers or chills.  PHYSICAL EXAMINATION:  VITAL SIGNS:  Temperature 97.9, blood pressure 158/105, pulse 90 and regular, respirations 20.  GENERAL:  He is alert and oriented x4.  HEENT:  The sclerae are slightly muddy in appearance. An NG tube is in place with bright red blood in the tubing. Mucus membranes are moist. Neck is without JVD or lymphadenopathy.  CARDIOVASCULAR:  Normal S1, S2. Regular rate and rhythm. No murmurs.  LUNGS:  Clear to auscultation bilaterally.  ABDOMEN:  Soft, nontender, nondistended. Positive bowel sounds. No appreciable hepatosplenomegaly or masses noted on examination.  RECTAL:  Significant for black, tarry, heme-positive stools per the ED physician.  EXTREMITIES:  No clubbing, cyanosis, or edema.  LABS ON ADMISSION:  CBC shows a hemoglobin of 14.2 and 40.3. On repeat performed 4 hours later, hemoglobin had dropped to 11.9. PT 13.0, INR 1.0, PTT 28, amylase was 133, lipase and CMP was pending. Cardiac enzymes shows a total CK of 343, MB fraction of 2.8, index of 0.8, troponin 0.01. Urine drug screen is negative. Serum alcohol less than 5.  EKG shows a normal sinus rhythm with occasional PVCs and a prolonged QT.  Chest x-ray is currently pending.  ASSESSMENT AND PLAN:  This is a 60 year old male with a history of alcohol abuse and previous GI bleed secondary to a Mallory-Weiss tear who presents to the emergency department with a likely repeat upper gastrointestinal bleed. Will stabilize the  patient with intravenous fluids, blood if his hemoglobin continues to drop. Will plan on endoscopy later today; is already on a proton pump inhibitor. We will encourage alcohol abstinence during our hospitalization. Will follow up on his LFT results to ensure no liver dysfunction from his alcohol abuse. Dictated by:   Raynelle Jan Attending  Physician:  Madaline Guthrie DD:  10/13/01 TD:  10/13/01 Job: [id number]

## 2011-01-16 NOTE — Discharge Summary (Signed)
[place]. [facility]  Patient:    Collin Young, Collin Young Visit Number: [id number] MRN: [id number]          Service Type: MED Location: 2000 2010 01 Attending Physician:  Ronn Melena Dictated by:   Hillery Aldo, M.D. Admit Date:  12/09/2001 Disc. Date: 12/10/01                             Discharge Summary  DISCHARGE DIAGNOSES: 1. Chest pain, noncardiac. 2. Anemia secondary to history of Mallory-Weiss tears. 3. History of alcohol abuse, no longer uses. 4. Hypertension. 5. Mild elevation of liver transaminases. 6. Elevated total protein.  DISCHARGE MEDICATIONS: 1. Hydrochlorothiazide 25 mg p.o. q.d. 2. Altace 2.5 mg p.o. q.d. 3. Iron sulfate 325 mg p.o. t.i.d. with meals.  DIAGNOSTIC STUDIES: 1. Chest x-ray on December 09, 2001, no active disease, no infiltrates, fractures    or pneumothorax, slightly hyperaerated. 2. Electrocardiogram on December 09, 2001, sinus bradycardia at 48 beats per    minute, prominent Q wave in lead III, small Q wave in lead aVF.  No ST    segment abnormalities.  DISPOSITION:  Discharged to home.  CONDITION ON DISCHARGE:  Stable.  FOLLOWUP:  He is to call the clinic at [phone], for an appointment to be seen by Dr. Darnelle Catalan in two to three weeks on Monday.  At that time, Dr. Darnelle Catalan will schedule Mr. Kam to undergo a stress Cardiolite as well as follow up with a comprehensive metabolic panel to determine if his total protein is still elevated with a normal albumin.  If so, a workup will be undertaken including a UPEP and SPEP to rule out myeloma.  HISTORY OF PRESENT ILLNESS:  The patient is a 60 year old, African-American male well-known to me with a past medical history of alcohol abuse, Mallory-Weiss tears x2 with GI bleed who presented to the acute care clinic yesterday with one-week history of pain primarily under the left scapular area radiating to the left anterior chest wall.  The patient describes the pain  as lasting only five seconds in duration and it occurs approximately one time per day.  The patient denies any recent history of trauma or injury.  There is no accompanying shortness of breath, diaphoresis, nausea or vomiting.  The patient has never had any similar types of pain, although he does report that he had walking pneumonia at one time that has some similar qualities.  The pain is described as being sharp in quality and he rates it as a level 2-3/10. Pain does not have any anginal quality and is not associated with any increasing activity.  PHYSICAL EXAMINATION:  VITAL SIGNS:  Temperature 96.8, blood pressure 160/92, pulse 65, respiratory rate 16, O2 saturations 100% on room air.  GENERAL:  Well-developed, well-nourished, African-American male in no apparent distress.  HEENT:  Normocephalic, atraumatic.  PERRL.  EOMI.  Muddy sclerae.  Oropharynx is clear.  The patient is edentulous.  NECK:  Supple without JVD, thyromegaly, lymphadenopathy or bruits.  CHEST:  Coarse breath sounds with good air movement bilaterally.  HEART:  Regular rate and rhythm with a grade 2/6 systolic ejection murmur best heard at the left upper sternal border.  No audible S3 or S4.  ABDOMEN:  Soft, nontender, nondistended.  Bowel sounds present x4.  EXTREMITIES:  No clubbing, cyanosis, or edema.  LABORATORY DATA AND X-RAY FINDINGS:  Hemoglobin 14.3, white blood cell count 4.7, platelet count 141.  Sodium 133,  potassium 3.7, chloride 94, bicarb 30, BUN 15, creatinine 1.0, glucose 96, calcium 10.0.  Total bilirubin 0.5, Alk phos 75, AST 63, ALT 61, total protein 10.3, albumin 4.7.  The patients cardiac enzymes were negative x3.  His initial readings included a CK of 238, CK-MB of 3.2 and a troponin I of 0.02.  Lipid studies showed a cholesterol of 225, triglycerides 81, HDL 57, VLDL 16, LDL 152.  His iron studies revealed an iron level of 55, total iron binding capacity of 516, 11% saturations.   His ferritin was low at 13.  HOSPITAL COURSE:  #1 - CHEST PAIN:  The patient was ruled out for an acute MI. His chest pain was very atypical and he did not experience any chest pain while hospitalized.  There was no evidence of pneumonia, pneumothorax or trauma on his chest film.  It was felt that the pain was secondary to musculoskeletal origin, but given the fact that he does have some cardiac risk factors as well as elevated LDL accompanyed with a history of alcohol abuse, heavy tobacco abuse and a family history of heart disease as well as hypertension, we will follow this up as an outpatient by scheduling him to undergo stress Cardiolite testing.  His electrocardiogram did show an old Q wave in the inferior leads which would suggest some ischemic event at some point in the past.  We elected not to begin him on any antiplatelet therapy given his recent GI bleed.  At this time, no further workup is necessary and the patient can be safely discharged home with outpatient followup.  #2 - HYPERTENSION:  The patient is currently on hydrochlorothiazide.  We elected not to continue a beta-blocker given his relative bradycardia.  Since he is an African-American male we elected to start him on ACE inhibitor and added Altace 2.5 mg to his antihypertensive regimen.  The patient will follow up with me as an outpatient and we will adjust this up as necessary.  #3 - ELEVATED LIVER ENZYMES:  This is felt to be probably secondary to his recent history of alcohol abuse.  Notably, the patient has ceased alcohol intake as of February 2003.  We will monitor his comprehensive metabolic panel as an outpatient and will pursue further diagnostic testing if warranted.  #4 - ELEVATED TOTAL PROTEIN:  The patients total protein is elevated, but his albumin level is within the normal range.  This suggests that the fraction of elevated proteins are globulins.  We will follow up with this as an outpatient and  undertake further diagnostic testing if his total protein remains elevated.   ACTIVITY:  Resume activity as tolerated.  DIET:  Maintain a low salt diet.  SPECIAL INSTRUCTIONS:  He knows to call the clinic for any return of chest pain accompanied by sweating, nausea, vomiting or pain that does not go away after a few minutes.  Additionally, the patient will follow up with me in two to three weeks. Dictated by:   Hillery Aldo, M.D. Attending Physician:  Ronn Melena DD:  12/10/01 TD:  12/12/01 Job: [id number]

## 2011-05-14 ENCOUNTER — Other Ambulatory Visit: Payer: Self-pay | Admitting: Hematology & Oncology

## 2011-05-14 ENCOUNTER — Encounter (HOSPITAL_BASED_OUTPATIENT_CLINIC_OR_DEPARTMENT_OTHER): Payer: Medicaid Other | Admitting: Hematology & Oncology

## 2011-05-14 DIAGNOSIS — C154 Malignant neoplasm of middle third of esophagus: Secondary | ICD-10-CM

## 2011-05-14 LAB — CBC WITH DIFFERENTIAL (CANCER CENTER ONLY)
BASO#: 0 10*3/uL (ref 0.0–0.2)
BASO%: 0 % (ref 0.0–2.0)
HCT: 40.7 % (ref 38.7–49.9)
HGB: 14.5 g/dL (ref 13.0–17.1)
LYMPH#: 1.5 10*3/uL (ref 0.9–3.3)
LYMPH%: 33.8 % (ref 14.0–48.0)
MCV: 83 fL (ref 82–98)
MONO#: 0.7 10*3/uL (ref 0.1–0.9)
NEUT%: 48.4 % (ref 40.0–80.0)
RDW: 13.3 % (ref 11.1–15.7)
WBC: 4.5 10*3/uL (ref 4.0–10.0)

## 2011-05-14 LAB — COMPREHENSIVE METABOLIC PANEL
BUN: 18 mg/dL (ref 6–23)
CO2: 27 mEq/L (ref 19–32)
Calcium: 10 mg/dL (ref 8.4–10.5)
Chloride: 96 mEq/L (ref 96–112)
Creatinine, Ser: 1.31 mg/dL (ref 0.50–1.35)
Glucose, Bld: 101 mg/dL — ABNORMAL HIGH (ref 70–99)
Total Bilirubin: 0.3 mg/dL (ref 0.3–1.2)

## 2011-05-14 LAB — LACTATE DEHYDROGENASE: LDH: 199 U/L (ref 94–250)

## 2011-11-11 ENCOUNTER — Other Ambulatory Visit (HOSPITAL_BASED_OUTPATIENT_CLINIC_OR_DEPARTMENT_OTHER): Payer: Medicaid Other | Admitting: Lab

## 2011-11-11 ENCOUNTER — Ambulatory Visit (HOSPITAL_BASED_OUTPATIENT_CLINIC_OR_DEPARTMENT_OTHER): Payer: Medicaid Other | Admitting: Hematology & Oncology

## 2011-11-11 VITALS — BP 166/96 | HR 69 | Temp 97.4°F | Ht 66.0 in | Wt 152.0 lb

## 2011-11-11 DIAGNOSIS — C155 Malignant neoplasm of lower third of esophagus: Secondary | ICD-10-CM

## 2011-11-11 DIAGNOSIS — C159 Malignant neoplasm of esophagus, unspecified: Secondary | ICD-10-CM

## 2011-11-11 DIAGNOSIS — I10 Essential (primary) hypertension: Secondary | ICD-10-CM

## 2011-11-11 LAB — CBC WITH DIFFERENTIAL (CANCER CENTER ONLY)
BASO%: 0.3 % (ref 0.0–2.0)
EOS%: 3.1 % (ref 0.0–7.0)
LYMPH#: 1.6 10*3/uL (ref 0.9–3.3)
MCHC: 33.8 g/dL (ref 32.0–35.9)
MONO#: 0.7 10*3/uL (ref 0.1–0.9)
NEUT#: 1.5 10*3/uL (ref 1.5–6.5)
Platelets: 188 10*3/uL (ref 145–400)
RDW: 13.5 % (ref 11.1–15.7)
WBC: 3.9 10*3/uL — ABNORMAL LOW (ref 4.0–10.0)

## 2011-11-11 LAB — COMPREHENSIVE METABOLIC PANEL
ALT: 23 U/L (ref 0–53)
AST: 28 U/L (ref 0–37)
CO2: 26 mEq/L (ref 19–32)
Calcium: 9.5 mg/dL (ref 8.4–10.5)
Chloride: 102 mEq/L (ref 96–112)
Creatinine, Ser: 1.27 mg/dL (ref 0.50–1.35)
Sodium: 138 mEq/L (ref 135–145)
Total Protein: 6.9 g/dL (ref 6.0–8.3)

## 2011-11-11 LAB — LACTATE DEHYDROGENASE: LDH: 204 U/L (ref 94–250)

## 2011-11-11 MED ORDER — AMLODIPINE BESY-BENAZEPRIL HCL 5-10 MG PO CAPS: 1.0000 | ORAL_CAPSULE | Freq: Every day | ORAL | Status: DC

## 2011-11-11 NOTE — Progress Notes (Signed)
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 I last saw him a year ago. I am unsure as to what happened to his last visit.  His Port-A-Cath is now out. He sees Dr. Su Hilt of bone general medicine. He said Dr. Su Hilt to involve his cholesterol and blood pressure medicine.  He's had a positive swallowing. There's been no cough. He's had no headache. He has had some leg pains. It sounds like he has claudication. I told him to take2 baby aspirin a day  He's had no change in bowel or bladder habits. He's had no fever. He's had no bleeding.  On physical exam this is a well-developed well-nourished black gentleman in no obvious distress. Vital signs 97.4 pulse 69 respiratory 16 blood pressure 166/90 weight is 152 pounds.  Head exam shows A. normocephalic atraumatic scalp. He has no scleral icterus. There is no oral lesion. He has no adenopathy in his neck. Lungs are clear bilaterally. Cardiac exam regular rate and rhythm with a normal S1-S2. There are no murmurs rubs or bruits. Abdominal exam soft with good bowel sounds. There is no palpable abdominal masses. There is no fluid wave. There is a palpable hepatosplenomegaly. Back exam shows no tenderness over the spine ribs or hips. Extremities shows no clubbing cyanosis or edema. He seems to have decent pulses in his distal extremities. Her wound laboratory studies white cell count your hemoglobin 14.5 hematocrit 42.9 platelet count 188.  Impression: Collin Young is a 61 year old black children with a history of stage III squamous cell carcinoma of esophagus. He now is out over 2 years from completion of chemotherapy and radiation therapy. He did not require any surgery.  We will follow him up in 6 months. I did prescribe some Lotrel (5/10) for his blood pressure. I encouraged him to see Dr. Su Hilt in followup.

## 2011-12-18 ENCOUNTER — Encounter (HOSPITAL_COMMUNITY): Payer: Self-pay | Admitting: Emergency Medicine

## 2011-12-18 ENCOUNTER — Emergency Department (HOSPITAL_COMMUNITY)
Admission: EM | Admit: 2011-12-18 | Discharge: 2011-12-18 | Disposition: A | Discharge status: Home / Self Care | Payer: Medicaid Other | Attending: Emergency Medicine | Admitting: Emergency Medicine

## 2011-12-18 DIAGNOSIS — M79609 Pain in unspecified limb: Secondary | ICD-10-CM | POA: Insufficient documentation

## 2011-12-18 DIAGNOSIS — Z79899 Other long term (current) drug therapy: Secondary | ICD-10-CM | POA: Insufficient documentation

## 2011-12-18 DIAGNOSIS — W268XXA Contact with other sharp object(s), not elsewhere classified, initial encounter: Secondary | ICD-10-CM | POA: Insufficient documentation

## 2011-12-18 DIAGNOSIS — Z23 Encounter for immunization: Secondary | ICD-10-CM

## 2011-12-18 DIAGNOSIS — I10 Essential (primary) hypertension: Secondary | ICD-10-CM | POA: Insufficient documentation

## 2011-12-18 HISTORY — DX: Essential (primary) hypertension: I10

## 2011-12-18 MED ORDER — TETANUS-DIPHTHERIA TOXOIDS TD 5-2 LFU IM INJ
0.5000 mL | INJECTION | Freq: Once | INTRAMUSCULAR | Status: DC
  Filled 2011-12-18: qty 0.5

## 2011-12-18 MED ORDER — TETANUS-DIPHTH-ACELL PERTUSSIS 5-2.5-18.5 LF-MCG/0.5 IM SUSP
0.5000 mL | Freq: Once | INTRAMUSCULAR | Status: AC
  Administered 2011-12-18: 0.5 mL via INTRAMUSCULAR

## 2011-12-18 NOTE — Discharge Instructions (Signed)
Collin Young we updated your tetanus today. Follow up with Dr. Roxan Hockey if you have any problems with your foot this week. I did not see an injury from the nail today. We did use some antibacterial medication on your foot today.

## 2011-12-18 NOTE — ED Provider Notes (Signed)
History     CSN: [id number]  Arrival date & time 12/18/11  1645   None     Chief Complaint  Patient presents with  . Foot Pain    (Consider location/radiation/quality/duration/timing/severity/associated sxs/prior treatment) Patient is a 61 y.o. male presenting with lower extremity pain. The history is provided by the patient. No language interpreter was used.  Foot Pain This is a new problem. The current episode started today. The problem has been unchanged. Pertinent negatives include no chills, fever, joint swelling, nausea or vomiting. The symptoms are aggravated by walking.   Patient reports he stepped on a nail today and it went through his shoe. No visible wound to the sole of his foot. Having no pain. States that he just wants to get a tetanus shot to make sure.  Past medical history of hypertension and esophageal cancer. No fever nausea vomiting swelling or erythema. Past Medical History  Diagnosis Date  . Hypertension   . Cancer     History reviewed. No pertinent past surgical history.  No family history on file.  History  Substance Use Topics  . Smoking status: Never Smoker   . Smokeless tobacco: Not on file  . Alcohol Use: No      Review of Systems  Constitutional: Negative.  Negative for fever and chills.  HENT: Negative.   Eyes: Negative.   Respiratory: Negative.   Cardiovascular: Negative.   Gastrointestinal: Negative.  Negative for nausea and vomiting.  Musculoskeletal: Negative for joint swelling.  Neurological: Negative.   Psychiatric/Behavioral: Negative.   All other systems reviewed and are negative.    Allergies  Review of patient's allergies indicates no known allergies.  Home Medications   Current Outpatient Rx  Name Route Sig Dispense Refill  . AMLODIPINE BESY-BENAZEPRIL HCL 5-10 MG PO CAPS Oral Take 1 capsule by mouth daily. 30 capsule 3    BP 132/71  Pulse 75  Temp(Src) 98.3 F (36.8 C) (Oral)  Resp 16  SpO2  100%  Physical Exam  Nursing note and vitals reviewed. Constitutional: He is oriented to person, place, and time. He appears well-developed and well-nourished.  HENT:  Head: Normocephalic.  Eyes: Conjunctivae and EOM are normal. Pupils are equal, round, and reactive to light.  Neck: Normal range of motion. Neck supple.  Cardiovascular: Normal rate.   Pulmonary/Chest: Effort normal.  Abdominal: Soft.  Musculoskeletal: Normal range of motion.  Neurological: He is alert and oriented to person, place, and time.  Skin: Skin is warm and dry.  Psychiatric: He has a normal mood and affect.    ED Course  Procedures (including critical care time)  Labs Reviewed - No data to display No results found.   No diagnosis found.    MDM  Tetanus update in ER today.  Stepped on a nail with no visible wound. Cleaned with betadine.  Return if worse.         Remi Haggard, NP 12/19/11 [phone]

## 2011-12-18 NOTE — ED Notes (Signed)
Stepped on nail approx 2 hours ago.  States he felt it go through L shoe and needs a tetanus shot.

## 2011-12-21 NOTE — ED Provider Notes (Signed)
Medical screening examination/treatment/procedure(s) were performed by non-physician practitioner and as supervising physician I was immediately available for consultation/collaboration.   Laray Anger, DO 12/21/11 0210

## 2012-01-11 ENCOUNTER — Other Ambulatory Visit: Payer: Self-pay | Admitting: *Deleted

## 2012-01-11 DIAGNOSIS — I10 Essential (primary) hypertension: Secondary | ICD-10-CM

## 2012-01-11 MED ORDER — AMLODIPINE BESY-BENAZEPRIL HCL 5-10 MG PO CAPS: 1.0000 | ORAL_CAPSULE | Freq: Every day | ORAL | Status: DC

## 2012-05-11 ENCOUNTER — Other Ambulatory Visit (HOSPITAL_COMMUNITY): Payer: Self-pay | Admitting: Internal Medicine

## 2012-05-11 DIAGNOSIS — Z87891 Personal history of nicotine dependence: Secondary | ICD-10-CM

## 2012-05-11 DIAGNOSIS — Z136 Encounter for screening for cardiovascular disorders: Secondary | ICD-10-CM

## 2012-05-12 ENCOUNTER — Other Ambulatory Visit (HOSPITAL_BASED_OUTPATIENT_CLINIC_OR_DEPARTMENT_OTHER): Payer: Medicaid Other | Admitting: Lab

## 2012-05-12 ENCOUNTER — Ambulatory Visit (HOSPITAL_BASED_OUTPATIENT_CLINIC_OR_DEPARTMENT_OTHER): Payer: Medicaid Other | Admitting: Hematology & Oncology

## 2012-05-12 ENCOUNTER — Ambulatory Visit: Payer: Medicaid Other | Admitting: Hematology & Oncology

## 2012-05-12 ENCOUNTER — Other Ambulatory Visit: Payer: Medicaid Other | Admitting: Lab

## 2012-05-12 VITALS — BP 129/63 | HR 68 | Temp 98.0°F | Resp 20 | Ht 66.0 in | Wt 145.0 lb

## 2012-05-12 DIAGNOSIS — C155 Malignant neoplasm of lower third of esophagus: Secondary | ICD-10-CM

## 2012-05-12 DIAGNOSIS — C159 Malignant neoplasm of esophagus, unspecified: Secondary | ICD-10-CM

## 2012-05-12 DIAGNOSIS — D72819 Decreased white blood cell count, unspecified: Secondary | ICD-10-CM

## 2012-05-12 LAB — COMPREHENSIVE METABOLIC PANEL
ALT: 22 U/L (ref 0–53)
BUN: 22 mg/dL (ref 6–23)
CO2: 29 mEq/L (ref 19–32)
Calcium: 9.6 mg/dL (ref 8.4–10.5)
Chloride: 98 mEq/L (ref 96–112)
Creatinine, Ser: 1.2 mg/dL (ref 0.50–1.35)
Glucose, Bld: 79 mg/dL (ref 70–99)

## 2012-05-12 LAB — CBC WITH DIFFERENTIAL (CANCER CENTER ONLY)
BASO#: 0 10*3/uL (ref 0.0–0.2)
BASO%: 0.3 % (ref 0.0–2.0)
Eosinophils Absolute: 0.1 10*3/uL (ref 0.0–0.5)
HCT: 40.2 % (ref 38.7–49.9)
HGB: 13.8 g/dL (ref 13.0–17.1)
LYMPH#: 1.5 10*3/uL (ref 0.9–3.3)
LYMPH%: 41.9 % (ref 14.0–48.0)
MCV: 86 fL (ref 82–98)
MONO#: 0.7 10*3/uL (ref 0.1–0.9)
NEUT%: 36.2 % — ABNORMAL LOW (ref 40.0–80.0)
RBC: 4.66 10*6/uL (ref 4.20–5.70)
WBC: 3.6 10*3/uL — ABNORMAL LOW (ref 4.0–10.0)

## 2012-05-12 NOTE — Progress Notes (Signed)
This office note has been dictated.

## 2012-05-12 NOTE — Patient Instructions (Signed)
Call if any infections or fever

## 2012-05-13 ENCOUNTER — Other Ambulatory Visit (HOSPITAL_COMMUNITY): Payer: Medicaid Other

## 2012-05-13 NOTE — Progress Notes (Signed)
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  CURRENT THERAPY:  Observation.  INTERIM HISTORY:  [name] comes in for followup.  We see him every 6 months.  He has been doing well with respect to his esophageal cancer. It has now been about 2-1/2 years since he completed chemo and radiation therapy.  He is having no dysphagia or odynophagia.  He does have a little bit of a cough.  He has had no fever.  He has had no hemoptysis.  He has had no nausea and vomiting.  There has been no change in bowel or bladder habits.  He has had no leg swelling.  There have been no rashes.  PHYSICAL EXAMINATION:  This is a well-developed, well-nourished black gentleman in no obvious distress.  Vital signs:  98, pulse 63, respiratory rate 18, blood pressure 129/63.  Weight is 145.  Head and neck:  Normocephalic, atraumatic skull.  There are no ocular or oral lesions.  There are no palpable cervical or supraclavicular lymph nodes. Lungs:  Clear bilaterally.  Cardiac:  Regular rate and rhythm with a normal S1 and S2.  There are no murmurs, rubs or bruits.  Abdomen:  Soft with good bowel sounds.  There is no palpable abdominal mass.  No palpable hepatosplenomegaly.  Back:  No tenderness over the spine, ribs, or hips.  Extremities:  No clubbing, cyanosis or edema.  Neurologic:  No focal neurological deficits.  LABORATORY STUDIES:  White cell count is 3.6, hemoglobin 13.8, hematocrit 40.2, platelet count is 179.  IMPRESSION:  [name] is a 61 year old gentleman with history of stage IIIB squamous cell carcinoma of the esophagus.  He underwent chemotherapy and radiation therapy.  He had cisplatin/5-FU.  I have noted that his white cell count has trended downward.  I am not sure as to why this is.  I think we are going to have to follow this.  I think I want him back in 3 months now.  I want to make sure that we stay on top of his blood counts.  I am not so worried  about esophageal cancer.  I feel he is cured of this.  Again, we will get [name] back in about 3 months so we can see how his blood counts are.    ______________________________ Josph Macho, M.D. PRE/MEDQ  D:  05/12/2012  T:  05/13/2012  Job:  [id number]

## 2012-05-18 ENCOUNTER — Other Ambulatory Visit (HOSPITAL_COMMUNITY): Payer: Medicaid Other

## 2012-08-12 ENCOUNTER — Ambulatory Visit: Payer: Medicaid Other | Admitting: Medical

## 2012-08-12 ENCOUNTER — Other Ambulatory Visit: Payer: Medicaid Other | Admitting: Lab

## 2012-08-15 ENCOUNTER — Other Ambulatory Visit (HOSPITAL_BASED_OUTPATIENT_CLINIC_OR_DEPARTMENT_OTHER): Payer: Medicaid Other | Admitting: Lab

## 2012-08-15 ENCOUNTER — Ambulatory Visit (HOSPITAL_BASED_OUTPATIENT_CLINIC_OR_DEPARTMENT_OTHER): Payer: Medicaid Other | Admitting: Medical

## 2012-08-15 VITALS — BP 143/74 | HR 78 | Temp 98.0°F | Resp 18 | Wt 143.0 lb

## 2012-08-15 DIAGNOSIS — C159 Malignant neoplasm of esophagus, unspecified: Secondary | ICD-10-CM

## 2012-08-15 DIAGNOSIS — D72819 Decreased white blood cell count, unspecified: Secondary | ICD-10-CM

## 2012-08-15 DIAGNOSIS — Z8501 Personal history of malignant neoplasm of esophagus: Secondary | ICD-10-CM

## 2012-08-15 LAB — CBC WITH DIFFERENTIAL (CANCER CENTER ONLY)
BASO%: 0.3 % (ref 0.0–2.0)
Eosinophils Absolute: 0.1 10*3/uL (ref 0.0–0.5)
HCT: 41 % (ref 38.7–49.9)
LYMPH#: 1.5 10*3/uL (ref 0.9–3.3)
MONO#: 0.6 10*3/uL (ref 0.1–0.9)
NEUT%: 36.2 % — ABNORMAL LOW (ref 40.0–80.0)
RBC: 4.75 10*6/uL (ref 4.20–5.70)
RDW: 13.5 % (ref 11.1–15.7)
WBC: 3.5 10*3/uL — ABNORMAL LOW (ref 4.0–10.0)

## 2012-08-15 LAB — CHCC SATELLITE - SMEAR

## 2012-08-15 NOTE — Progress Notes (Addendum)
Diagnosis: Stage IIIB (T3, N1, M0) squamous cell carcinoma of the esophagus.  Current therapy: Observation.  Interim history: [name] presents today for an office followup visit.  Overall, he, reports she's doing quite well.  He's not having any problems with dysphasia or odynophagia.  He's not reporting any, Mrs.  He has an excellent appetite.  He denies any nausea, vomiting, diarrhea, constipation, chest pain, shortness of breath, or cough.  He denies any fevers, chills, or night sweats.  He denies any palpable adenopathy.  He denies any obvious, or abnormal bleeding.  He denies any headaches, visual changes, or rashes.  He denies any lower leg swelling.  We are still continuing to monitor his white count.  It has been holding steady around 3.5.  He's not reporting any type of chronic or recurrent infections.  He is totally asymptomatic.  Review of Systems: Constitutional:Negative for malaise/fatigue, fever, chills, weight loss, diaphoresis, activity change, appetite change, and unexpected weight change.  HEENT: Negative for double vision, blurred vision, visual loss, ear pain, tinnitus, congestion, rhinorrhea, epistaxis sore throat or sinus disease, oral pain/lesion, tongue soreness Respiratory: Negative for cough, chest tightness, shortness of breath, wheezing and stridor.  Cardiovascular: Negative for chest pain, palpitations, leg swelling, orthopnea, PND, DOE or claudication Gastrointestinal: Negative for nausea, vomiting, abdominal pain, diarrhea, constipation, blood in stool, melena, hematochezia, abdominal distention, anal bleeding, rectal pain, anorexia and hematemesis.  Genitourinary: Negative for dysuria, frequency, hematuria,  Musculoskeletal: Negative for myalgias, back pain, joint swelling, arthralgias and gait problem.  Skin: Negative for rash, color change, pallor and wound.  Neurological:. Negative for dizziness/light-headedness, tremors, seizures, syncope, facial asymmetry,  speech difficulty, weakness, numbness, headaches and paresthesias.  Hematological: Negative for adenopathy. Does not bruise/bleed easily.  Psychiatric/Behavioral:  Negative for depression, no loss of interest in normal activity or change in sleep pattern.   Physical Exam: This is a 61 year old, well-developed, well-nourished , African American, gentleman, in no obvious distress Vitals: Temperature 90.2 degrees, pulse 78, respirations 18, blood pressure 143/74.  Weight 143 pounds HEENT reveals a normocephalic, atraumatic skull, no scleral icterus, no oral lesions  Neck is supple without any cervical or supraclavicular adenopathy.  Lungs are clear to auscultation bilaterally. There are no wheezes, rales or rhonci Cardiac is regular rate and rhythm with a normal S1 and S2. There are no murmurs, rubs, or bruits.  Abdomen is soft with good bowel sounds, there is no palpable mass. There is no palpable hepatosplenomegaly. There is no palpable fluid wave.  Musculoskeletal no tenderness of the spine, ribs, or hips.  Extremities there are no clubbing, cyanosis, or edema.  Skin no petechia, purpura or ecchymosis Neurologic is nonfocal.  Laboratory Data: White count 3.5, hemoglobin 13.7, hematocrit 41.0, platelets 222,000  Current Outpatient Prescriptions on File Prior to Visit  Medication Sig Dispense Refill  . amLODipine-benazepril (LOTREL) 5-10 MG per capsule Take 1 capsule by mouth daily.  30 capsule  3  . atorvastatin (LIPITOR) 10 MG tablet Take 10 mg by mouth bid        Assessment/Plan: This is a 60 year old, American, gentleman, with the following issues:  #1.  History of stage IIIB squamous cell carcinoma of the esophagus.  He did undergo chemotherapy and radiation therapy.  He had cisplatin/5-FU.  He does not have any signs or symptoms of relapse./recurrence on today's exam.  We will plan on bringing him back in 3 months.  #2.  Leukopenia.  His white count has been holding stable.  He is  asymptomatic.  He's not reported any recent infections.  We will continue to monitor this.  #3.  Follow.  We will follow back up with Collin Young in 3 months, but before then should there be questions or concerns.

## 2012-11-10 ENCOUNTER — Other Ambulatory Visit: Payer: Self-pay | Admitting: Medical

## 2012-11-10 DIAGNOSIS — Z8501 Personal history of malignant neoplasm of esophagus: Secondary | ICD-10-CM

## 2012-11-14 ENCOUNTER — Other Ambulatory Visit: Payer: Medicaid Other | Admitting: Lab

## 2012-11-14 ENCOUNTER — Ambulatory Visit: Payer: Medicaid Other | Admitting: Medical

## 2012-11-15 ENCOUNTER — Other Ambulatory Visit (HOSPITAL_BASED_OUTPATIENT_CLINIC_OR_DEPARTMENT_OTHER): Payer: Medicaid Other | Admitting: Lab

## 2012-11-15 ENCOUNTER — Ambulatory Visit (HOSPITAL_BASED_OUTPATIENT_CLINIC_OR_DEPARTMENT_OTHER): Payer: Medicaid Other | Admitting: Medical

## 2012-11-15 VITALS — BP 155/76 | HR 68 | Temp 98.1°F | Resp 18 | Ht 66.0 in | Wt 148.0 lb

## 2012-11-15 DIAGNOSIS — D72819 Decreased white blood cell count, unspecified: Secondary | ICD-10-CM

## 2012-11-15 DIAGNOSIS — Z8501 Personal history of malignant neoplasm of esophagus: Secondary | ICD-10-CM

## 2012-11-15 LAB — CBC WITH DIFFERENTIAL (CANCER CENTER ONLY)
BASO#: 0 10*3/uL (ref 0.0–0.2)
Eosinophils Absolute: 0.1 10*3/uL (ref 0.0–0.5)
HGB: 14.8 g/dL (ref 13.0–17.1)
LYMPH#: 1.7 10*3/uL (ref 0.9–3.3)
MCH: 28.5 pg (ref 28.0–33.4)
NEUT#: 2 10*3/uL (ref 1.5–6.5)
RBC: 5.19 10*6/uL (ref 4.20–5.70)

## 2012-11-15 LAB — COMPREHENSIVE METABOLIC PANEL
Albumin: 4.4 g/dL (ref 3.5–5.2)
CO2: 31 mEq/L (ref 19–32)
Chloride: 99 mEq/L (ref 96–112)
Glucose, Bld: 86 mg/dL (ref 70–99)
Potassium: 4.6 mEq/L (ref 3.5–5.3)
Sodium: 138 mEq/L (ref 135–145)
Total Protein: 7.1 g/dL (ref 6.0–8.3)

## 2012-11-15 LAB — LACTATE DEHYDROGENASE: LDH: 144 U/L (ref 94–250)

## 2012-11-15 NOTE — Progress Notes (Signed)
Diagnosis: Stage IIIB (T3, N1, M0) squamous cell carcinoma of the esophagus.  Current therapy: Observation.  Interim history: Collin Young presents today for an office followup visit.  Overall, he, reports she's doing quite well.  He's not having any problems with dysphasia or odynophagia.  He's not reporting any unintentional weight loss.  He has an excellent appetite.  He denies any nausea, vomiting, diarrhea, constipation, chest pain, shortness of breath, or cough.  He denies any fevers, chills, or night sweats.  He denies any palpable adenopathy.  He denies any obvious, or abnormal bleeding.  He denies any headaches, visual changes, or rashes.  He denies any lower leg swelling.  We are still continuing to monitor his white count.  It is completely normal today at 4.4.  He's not reporting any type of chronic or recurrent infections.  He is totally asymptomatic.  Review of Systems: Constitutional:Negative for malaise/fatigue, fever, chills, weight loss, diaphoresis, activity change, appetite change, and unexpected weight change.  HEENT: Negative for double vision, blurred vision, visual loss, ear pain, tinnitus, congestion, rhinorrhea, epistaxis sore throat or sinus disease, oral pain/lesion, tongue soreness Respiratory: Negative for cough, chest tightness, shortness of breath, wheezing and stridor.  Cardiovascular: Negative for chest pain, palpitations, leg swelling, orthopnea, PND, DOE or claudication Gastrointestinal: Negative for nausea, vomiting, abdominal pain, diarrhea, constipation, blood in stool, melena, hematochezia, abdominal distention, anal bleeding, rectal pain, anorexia and hematemesis.  Genitourinary: Negative for dysuria, frequency, hematuria,  Musculoskeletal: Negative for myalgias, back pain, joint swelling, arthralgias and gait problem.  Skin: Negative for rash, color change, pallor and wound.  Neurological:. Negative for dizziness/light-headedness, tremors, seizures, syncope,  facial asymmetry, speech difficulty, weakness, numbness, headaches and paresthesias.  Hematological: Negative for adenopathy. Does not bruise/bleed easily.  Psychiatric/Behavioral:  Negative for depression, no loss of interest in normal activity or change in sleep pattern.   Physical Exam: This is a 62 year old, well-developed, well-nourished , African American, gentleman, in no obvious distress Vitals: Temperature 98.1 degrees, pulse 68, respirations 18, blood pressure 155/76.  Weight 148 pounds HEENT reveals a normocephalic, atraumatic skull, no scleral icterus, no oral lesions  Neck is supple without any cervical or supraclavicular adenopathy.  Lungs are clear to auscultation bilaterally. There are no wheezes, rales or rhonci Cardiac is regular rate and rhythm with a normal S1 and S2. There are no murmurs, rubs, or bruits.  Abdomen is soft with good bowel sounds, there is no palpable mass. There is no palpable hepatosplenomegaly. There is no palpable fluid wave.  Musculoskeletal no tenderness of the spine, ribs, or hips.  Extremities there are no clubbing, cyanosis, or edema.  Skin no petechia, purpura or ecchymosis Neurologic is nonfocal.  Laboratory Data: White count 4.4, hemoglobin 14.8, hematocrit 44.2, platelets 224,000  Current Outpatient Prescriptions on File Prior to Visit  Medication Sig Dispense Refill  . amLODipine-benazepril (LOTREL) 5-10 MG per capsule Take 1 capsule by mouth daily.  30 capsule  3  . atorvastatin (LIPITOR) 10 MG tablet Take 10 mg by mouth bid        Assessment/Plan: This is a 62 year old, American, gentleman, with the following issues:  #1.  History of stage IIIB squamous cell carcinoma of the esophagus.  He did undergo chemotherapy and radiation therapy.  He had cisplatin/5-FU.  He does not have any signs or symptoms of relapse./recurrence on today's exam.  We will plan on bringing him back in 3 months.  #2.  Leukopenia.  His white count has been holding  stable.  He is  asymptomatic.  He's not reported any recent infections.  We will continue to monitor this.  #3.  Follow.  We will follow back up with [name] in 3 months, but before then should there be questions or concerns.

## 2013-02-15 ENCOUNTER — Ambulatory Visit (HOSPITAL_BASED_OUTPATIENT_CLINIC_OR_DEPARTMENT_OTHER): Payer: Medicaid Other | Admitting: Hematology & Oncology

## 2013-02-15 ENCOUNTER — Other Ambulatory Visit (HOSPITAL_BASED_OUTPATIENT_CLINIC_OR_DEPARTMENT_OTHER): Payer: Medicaid Other | Admitting: Lab

## 2013-02-15 VITALS — BP 130/72 | HR 70 | Temp 98.1°F | Resp 18 | Ht 66.0 in | Wt 136.0 lb

## 2013-02-15 DIAGNOSIS — D72819 Decreased white blood cell count, unspecified: Secondary | ICD-10-CM

## 2013-02-15 DIAGNOSIS — C159 Malignant neoplasm of esophagus, unspecified: Secondary | ICD-10-CM

## 2013-02-15 DIAGNOSIS — Z8501 Personal history of malignant neoplasm of esophagus: Secondary | ICD-10-CM

## 2013-02-15 LAB — COMPREHENSIVE METABOLIC PANEL
ALT: 18 U/L (ref 0–53)
AST: 25 U/L (ref 0–37)
Alkaline Phosphatase: 31 U/L — ABNORMAL LOW (ref 39–117)
Calcium: 9.4 mg/dL (ref 8.4–10.5)
Chloride: 100 mEq/L (ref 96–112)
Creatinine, Ser: 1.27 mg/dL (ref 0.50–1.35)

## 2013-02-15 LAB — CBC WITH DIFFERENTIAL (CANCER CENTER ONLY)
BASO%: 0 % (ref 0.0–2.0)
LYMPH#: 1.2 10*3/uL (ref 0.9–3.3)
LYMPH%: 30.8 % (ref 14.0–48.0)
MCV: 87 fL (ref 82–98)
MONO#: 0.7 10*3/uL (ref 0.1–0.9)
Platelets: 192 10*3/uL (ref 145–400)
RDW: 13.8 % (ref 11.1–15.7)
WBC: 4 10*3/uL (ref 4.0–10.0)

## 2013-02-15 LAB — CHCC SATELLITE - SMEAR

## 2013-02-16 NOTE — Progress Notes (Signed)
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  CURRENT THERAPY:  Observation.  INTERIM HISTORY:  Collin Young comes in for followup.  He is doing well. He has had no problems since we last saw him.  He has had no swallowing difficulties.  He has had no nausea or vomiting.  He has had no fevers, sweats, or chills.  He has had no headache or blurred vision.  He has had no weight loss or weight gain.  Overall, his performance status is ECOG 1.  PHYSICAL EXAMINATION:  General:  This is a well-developed, well- nourished African American gentleman in no obvious distress.  Vital signs:  Temperature of 98.1, pulse 70, respiratory rate 18, blood pressure is 130/72.  Weight is 136.  Head and neck:  Normocephalic, atraumatic skull.  There are no ocular or oral lesions.  There are no palpable cervical or supraclavicular lymph nodes.  Lungs:  Clear bilaterally.  Cardiac:  Regular rate and rhythm with a normal S1 and S2. There are no murmurs, rubs, or bruits.  Abdomen:  Soft with good bowel sounds.  There is no palpable abdominal mass.  There is no fluid wave. There is no palpable hepatosplenomegaly.  Extremities:  No clubbing, cyanosis, or edema.  Neurological:  No focal neurological deficits. Skin:  No rashes, ecchymosis, or petechia.  LABORATORY STUDIES:  White cell count is 4, hemoglobin 13, hematocrit 39.6, platelet count 196.  IMPRESSION:  Collin Young is a 62 year old gentleman with history of stage IIIB (T3 N1 M0) squamous cell carcinoma of the esophagus.  He underwent chemo and radiation therapy.  He now has been out of treatment by close to 3-1/2 years.  I do not see any evidence of recurrence clinically.  I do not see that we have to do any scans on him.  I am glad that his white cell count is stabilizing.  We will see Collin Young back in about 6 months now.    ______________________________ Collin Young, M.D. PRE/MEDQ  D:  02/15/2013  T:   02/16/2013  Job:  [id number]

## 2013-08-15 ENCOUNTER — Telehealth: Payer: Self-pay | Admitting: Hematology & Oncology

## 2013-08-15 NOTE — Telephone Encounter (Signed)
Patient called and cx 08/17/13 and resch for 08/25/13

## 2013-08-17 ENCOUNTER — Ambulatory Visit: Payer: Medicaid Other | Admitting: Hematology & Oncology

## 2013-08-17 ENCOUNTER — Other Ambulatory Visit: Payer: Medicaid Other | Admitting: Lab

## 2013-08-25 ENCOUNTER — Ambulatory Visit (HOSPITAL_BASED_OUTPATIENT_CLINIC_OR_DEPARTMENT_OTHER): Payer: Medicaid Other | Admitting: Hematology & Oncology

## 2013-08-25 ENCOUNTER — Telehealth: Payer: Self-pay | Admitting: Hematology & Oncology

## 2013-08-25 ENCOUNTER — Other Ambulatory Visit (HOSPITAL_BASED_OUTPATIENT_CLINIC_OR_DEPARTMENT_OTHER): Payer: Self-pay

## 2013-08-25 ENCOUNTER — Other Ambulatory Visit: Payer: Self-pay | Admitting: Lab

## 2013-08-25 VITALS — BP 153/78 | HR 73 | Temp 98.0°F | Resp 16 | Wt 143.0 lb

## 2013-08-25 DIAGNOSIS — C159 Malignant neoplasm of esophagus, unspecified: Secondary | ICD-10-CM

## 2013-08-25 DIAGNOSIS — Z8501 Personal history of malignant neoplasm of esophagus: Secondary | ICD-10-CM

## 2013-08-25 DIAGNOSIS — R1310 Dysphagia, unspecified: Secondary | ICD-10-CM

## 2013-08-25 DIAGNOSIS — R599 Enlarged lymph nodes, unspecified: Secondary | ICD-10-CM

## 2013-08-25 LAB — CBC WITH DIFFERENTIAL (CANCER CENTER ONLY)
BASO#: 0 10*3/uL (ref 0.0–0.2)
EOS%: 1.9 % (ref 0.0–7.0)
HCT: 42.2 % (ref 38.7–49.9)
HGB: 14 g/dL (ref 13.0–17.1)
LYMPH%: 37.5 % (ref 14.0–48.0)
MCH: 28.6 pg (ref 28.0–33.4)
MCHC: 33.2 g/dL (ref 32.0–35.9)
MCV: 86 fL (ref 82–98)
MONO%: 16 % — ABNORMAL HIGH (ref 0.0–13.0)
NEUT#: 1.9 10*3/uL (ref 1.5–6.5)
NEUT%: 44.4 % (ref 40.0–80.0)

## 2013-08-25 NOTE — Telephone Encounter (Signed)
Pt's medicaid cx he is aware that he is now self pay. He states he is in the process of getting it reinstated.

## 2013-08-25 NOTE — Progress Notes (Signed)
This office note has been dictated.

## 2013-08-26 LAB — COMPREHENSIVE METABOLIC PANEL
Alkaline Phosphatase: 39 U/L (ref 39–117)
BUN: 19 mg/dL (ref 6–23)
CO2: 23 mEq/L (ref 19–32)
Glucose, Bld: 94 mg/dL (ref 70–99)
Total Bilirubin: 0.3 mg/dL (ref 0.3–1.2)

## 2013-08-29 NOTE — Progress Notes (Signed)
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  CURRENT THERAPY:  Observation.  INTERIM HISTORY:  Mr. Heldman comes in for his followup.  His main problem has been some dysphagia.  This has been going on for about 2-3 months.  Of course, Markevius has not told anybody about this.  He has had no vomiting.  He says that food does seem to get stuck in his throat. This is in the lower part of his esophagus.  We thought we will have to get an upper GI on him.  He also does complain of some swelling in the neck.  He may have some slight lymphadenopathy in the left neck.  We will try to get a CT scan to assess this.  Otherwise, he has been doing okay.  His weight has been pretty stable. He has actually gained a little bit of weight.  He has had no problems with bowels or bladder.  He has had no cough.  He has had no fever, sweats, or chills.  PHYSICAL EXAMINATION:  General:  This is a fairly well-developed, well- nourished white gentleman in no obvious distress.  Vital Signs: Temperature of 98, pulse 73, respiratory rate 16, blood pressure 153/78, weight is 143 pounds.  Head and Neck:  Normocephalic, atraumatic skull. There is no scleral icterus.  There are no oral lesions.  There may be some slight fullness in the left cervical lymph node chain.  No tenderness is noted to palpation over his neck.  There is no supraclavicular lymph nodes.  Lungs:  Clear bilaterally.  Cardiac: Regular rate and rhythm with a normal S1, S2.  He has no murmurs, rubs, or bruits.  Abdomen:  Soft.  He has good bowel sounds.  There is no fluid wave.  There is no palpable abdominal mass.  There is no palpable hepatosplenomegaly.  Back:  No tenderness over the spine, ribs, or hips. Extremities:  No clubbing, cyanosis, or edema.  LABORATORY STUDIES:  White cell count 4.2, hemoglobin 14, hematocrit 42, platelet count 195.  Sodium 139, potassium 4.6, BUN 19, creatinine  1.33. Calcium 9.6 with an albumin of 4.1.  IMPRESSION:  Mr. Bathgate is a 62 year old gentleman with history of stage IIIB  squamous cell carcinoma of the esophagus.  He underwent chemotherapy and radiation therapy.  He is now out from treatment by 4 years.  I think it would be highly unusual for him to have recurrence after 4 years.  However, we will go ahead and evaluate him.  Upper GI and I think a CT of the neck would certainly be appropriate for him.  He does have risk factors for a second type of upper airway cancer.  We need to keep this in mind.  I will go ahead and plan to see him back in another couple months so that we can follow up on his symptoms.    ______________________________ Josph Macho, M.D. PRE/MEDQ  D:  08/28/2013  T:  08/29/2013  Job:  [id number]

## 2013-09-07 ENCOUNTER — Ambulatory Visit (HOSPITAL_BASED_OUTPATIENT_CLINIC_OR_DEPARTMENT_OTHER): Payer: Medicaid Other | Admitting: Hematology & Oncology

## 2013-09-07 ENCOUNTER — Ambulatory Visit (HOSPITAL_COMMUNITY)
Admission: RE | Admit: 2013-09-07 | Discharge: 2013-09-07 | Disposition: A | Discharge status: Home / Self Care | Payer: Medicaid Other | Source: Ambulatory Visit | Attending: Hematology & Oncology | Admitting: Hematology & Oncology

## 2013-09-07 ENCOUNTER — Encounter (HOSPITAL_COMMUNITY): Payer: Self-pay

## 2013-09-07 ENCOUNTER — Other Ambulatory Visit: Payer: Self-pay | Admitting: Hematology & Oncology

## 2013-09-07 VITALS — BP 166/81 | HR 90 | Temp 98.0°F | Resp 18 | Ht 66.0 in | Wt 147.0 lb

## 2013-09-07 DIAGNOSIS — Z8501 Personal history of malignant neoplasm of esophagus: Secondary | ICD-10-CM

## 2013-09-07 DIAGNOSIS — Z923 Personal history of irradiation: Secondary | ICD-10-CM | POA: Insufficient documentation

## 2013-09-07 DIAGNOSIS — R221 Localized swelling, mass and lump, neck: Principal | ICD-10-CM

## 2013-09-07 DIAGNOSIS — R1310 Dysphagia, unspecified: Secondary | ICD-10-CM | POA: Insufficient documentation

## 2013-09-07 DIAGNOSIS — C159 Malignant neoplasm of esophagus, unspecified: Secondary | ICD-10-CM

## 2013-09-07 DIAGNOSIS — R599 Enlarged lymph nodes, unspecified: Secondary | ICD-10-CM

## 2013-09-07 DIAGNOSIS — R220 Localized swelling, mass and lump, head: Secondary | ICD-10-CM | POA: Insufficient documentation

## 2013-09-07 DIAGNOSIS — I6529 Occlusion and stenosis of unspecified carotid artery: Secondary | ICD-10-CM | POA: Insufficient documentation

## 2013-09-07 DIAGNOSIS — M5020 Other cervical disc displacement, unspecified cervical region: Secondary | ICD-10-CM | POA: Insufficient documentation

## 2013-09-07 DIAGNOSIS — R918 Other nonspecific abnormal finding of lung field: Secondary | ICD-10-CM | POA: Insufficient documentation

## 2013-09-07 DIAGNOSIS — I658 Occlusion and stenosis of other precerebral arteries: Secondary | ICD-10-CM | POA: Insufficient documentation

## 2013-09-07 DIAGNOSIS — C12 Malignant neoplasm of pyriform sinus: Secondary | ICD-10-CM

## 2013-09-07 DIAGNOSIS — K224 Dyskinesia of esophagus: Secondary | ICD-10-CM | POA: Insufficient documentation

## 2013-09-07 MED ORDER — IOHEXOL 300 MG/ML  SOLN
100.0000 mL | Freq: Once | INTRAMUSCULAR | Status: AC | PRN
  Administered 2013-09-07: 100 mL via INTRAVENOUS

## 2013-09-07 NOTE — Progress Notes (Signed)
This office note has been dictated.

## 2013-09-08 ENCOUNTER — Telehealth: Payer: Self-pay | Admitting: Hematology & Oncology

## 2013-09-08 NOTE — Progress Notes (Signed)
DIAGNOSES: 1. New left piriform sinus mass. 2. History of stage IIIB (T3 N1 M0) squamous cell carcinoma of the mid     esophagus.  CURRENT THERAPY:  Observation.  INTERIM HISTORY:  Collin Young comes in for a quick followup. Unfortunately, he had scans done today, and these scans turn out to have a surprising finding.  I saw him back in December.  He was saying that he was having some problems with swallowing.  He is having some pain in the left neck.  When I examined him, there was some fullness in the left cervical lymph node chain.  As such, we went ahead and evaluated him.  We got a CT scan of his neck.  This was done today.  Shockingly enough, this showed a bulky mass centered at the left piriform sinus/left area epiglottic fold.  This extended to the glottic level invading the periglottic fat with bowing of the tumor in an infraglottic position.  The tumor narrows the air, and displaced structures to the right.  There were some scattered lymph nodes on the left neck.  He has some atherosclerotic change.  There were two 4 mm nodules in the right upper lung.  Of course, the radiologist could not exclude metastatic lesions.  He did have some radiation changes in the superior mediastinum.  He also had an upper GI done.  This showed an asymmetric pharyngeal and laryngeal mass on left side.  This showed hypopharynx above the level of vocal cord. Otherwise, the barium swallow seemed to be unremarkable.  Mr. Lill does not have any shortness of breath.  He is having some throat pain.  He says the pain radiates to his left ear.  This probably is reflective of Arnold's nerve being involved by tumor.  He has had no cough or shortness of breath.  He has had no hemoptysis.  PHYSICAL EXAMINATION:  General:  This is a fairly well-developed, well- nourished African American gentleman in no obvious distress.  Vital Signs:  Temperature of 98, pulse 90, respiratory rate 18, blood  pressure 166/81.  Weight is 147 pounds.  Head and Neck:  Normocephalic, atraumatic skull.  There is still some fullness over the left upper anterior neck.  There is a possible mass lymph node palpable in the upper anterior left cervical neck.  No supraclavicular lymph nodes noted.  He has no intraoral lesions.  Lungs:  Clear bilaterally. Cardiac:  Regular rate and rhythm with a normal S1, S2.  There are no murmurs, rubs, or bruits.  Abdomen:  Soft.  He has good bowel sounds. There is no fluid wave.  There is no palpable abdominal mass.  There is no palpable hepatosplenomegaly.  Back:  No tenderness over the spine, ribs, or hips.  Extremities:  Show no clubbing, cyanosis, or edema. Neurological:  Shows no focal neurological deficit.  LABORATORY DATA:  Not done in this visit.  IMPRESSION:  Collin Young is a nice 63 year old gentleman.  He has a past history of a squamous cell carcinoma of the esophagus.  He underwent chemo and radiation therapy for this.  He went into remission with this.  He is cured from my point of view.  I really believe that he now has a second primary.  We do not have a biopsy yet, but I would be shocked if this was not squamous cell carcinoma.  I spoke with Dr. Onnie Graham of ENT.  He will see Collin Young.  I do not detect any stridor on  Collin Young exam, so there is no airway compromise right now.  I suspect that this is going to be at least stage III disease.  I would not think he is a surgical candidate.  I likely would plan for upfront chemotherapy followed by chemo and radiation therapy.  I am not sure how much radiation he can get, or if he can get radiation with his past esophageal cancer radiation.  I would have thought that the esophageal radiation was much slower down and probably into his chest.  I showed Collin Young the scans.  I reviewed the results with him.  He understands what was going on.  I told him that we could still  cure this if this is malignant.  My only concern would be these two 4 mm nodules in his lung that are indeterminate.  We will try to move quickly with this.  I will get him set up with a PET scan.  We will probably get Collin Young back to see [place] in another couple of weeks.  Hopefully, he will not need to be hospitalized for this.  I spent a good 40 minutes with him.  I am surprised that we are dealing with a second malignancy.    ______________________________ [name], M.D. PRE/MEDQ  D:  09/07/2013  T:  09/08/2013  Job:  [id number]

## 2013-09-08 NOTE — Telephone Encounter (Signed)
Pt aware of 1-15 PET scan

## 2013-09-12 ENCOUNTER — Other Ambulatory Visit (HOSPITAL_COMMUNITY): Payer: Self-pay | Admitting: Otolaryngology

## 2013-09-13 ENCOUNTER — Encounter (HOSPITAL_COMMUNITY): Payer: Self-pay

## 2013-09-13 ENCOUNTER — Encounter (HOSPITAL_COMMUNITY)
Admission: RE | Admit: 2013-09-13 | Discharge: 2013-09-13 | Disposition: A | Discharge status: Home / Self Care | Payer: Medicaid Other | Source: Ambulatory Visit | Attending: Anesthesiology | Admitting: Anesthesiology

## 2013-09-13 ENCOUNTER — Encounter (HOSPITAL_COMMUNITY)
Admission: RE | Admit: 2013-09-13 | Discharge: 2013-09-13 | Disposition: A | Discharge status: Home / Self Care | Payer: Medicaid Other | Source: Ambulatory Visit | Attending: Otolaryngology | Admitting: Otolaryngology

## 2013-09-13 DIAGNOSIS — Z01810 Encounter for preprocedural cardiovascular examination: Secondary | ICD-10-CM | POA: Insufficient documentation

## 2013-09-13 DIAGNOSIS — Z01811 Encounter for preprocedural respiratory examination: Secondary | ICD-10-CM

## 2013-09-13 DIAGNOSIS — Z01818 Encounter for other preprocedural examination: Secondary | ICD-10-CM | POA: Insufficient documentation

## 2013-09-13 DIAGNOSIS — Z01812 Encounter for preprocedural laboratory examination: Secondary | ICD-10-CM | POA: Insufficient documentation

## 2013-09-13 HISTORY — DX: Unspecified hearing loss, unspecified ear: H91.90

## 2013-09-13 HISTORY — DX: Gastro-esophageal reflux disease without esophagitis: K21.9

## 2013-09-13 LAB — CBC
HCT: 44.6 % (ref 39.0–52.0)
Hemoglobin: 15.1 g/dL (ref 13.0–17.0)
MCH: 29 pg (ref 26.0–34.0)
MCHC: 33.9 g/dL (ref 30.0–36.0)
MCV: 85.8 fL (ref 78.0–100.0)
PLATELETS: 205 10*3/uL (ref 150–400)
RBC: 5.2 MIL/uL (ref 4.22–5.81)
RDW: 13.7 % (ref 11.5–15.5)
WBC: 4.5 10*3/uL (ref 4.0–10.5)

## 2013-09-13 LAB — COMPREHENSIVE METABOLIC PANEL
ALK PHOS: 47 U/L (ref 39–117)
ALT: 20 U/L (ref 0–53)
AST: 27 U/L (ref 0–37)
Albumin: 4 g/dL (ref 3.5–5.2)
BILIRUBIN TOTAL: 0.3 mg/dL (ref 0.3–1.2)
BUN: 18 mg/dL (ref 6–23)
CHLORIDE: 99 meq/L (ref 96–112)
CO2: 30 mEq/L (ref 19–32)
Calcium: 9.8 mg/dL (ref 8.4–10.5)
Creatinine, Ser: 1.14 mg/dL (ref 0.50–1.35)
GFR, EST AFRICAN AMERICAN: 78 mL/min — AB (ref >90)
GFR, EST NON AFRICAN AMERICAN: 67 mL/min — AB (ref >90)
GLUCOSE: 82 mg/dL (ref 70–99)
POTASSIUM: 4.9 meq/L (ref 3.7–5.3)
Sodium: 140 mEq/L (ref 137–147)
Total Protein: 7.6 g/dL (ref 6.0–8.3)

## 2013-09-13 NOTE — Pre-Procedure Instructions (Signed)
NASSER KU  09/13/2013   Your procedure is scheduled on: Friday, January 16.  Report to [name], Main Entrance [facility]" at 9:00 AM.  Call this number if you have problems the morning of surgery: [phone]   Remember:   Do not eat food or drink liquids after midnight Thursday.   Take these medicines the morning of surgery with A SIP OF WATER: None   Do not wear jewelry, make-up or nail polish.  Do not wear lotions, powders, or perfumes. You may wear deodorant.   Men may shave face and neck.  Do not bring valuables to the hospital.  [facility] is not responsible for any belongings or valuables.               Contacts, dentures or bridgework may not be worn into surgery.  Leave suitcase in the car. After surgery it may be brought to your room.  For patients admitted to the hospital, discharge time is determined by your treatment team.               Patients discharged the day of surgery will not be allowed to drive home.  Name and phone number of your driver: -   Special Instructions: Shower using CHG 2 nights before surgery and the night before surgery.  If you shower the day of surgery use CHG.  Use special wash - you have one bottle of CHG for all showers.  You should use approximately 1/3 of the bottle for each shower.   Please read over the following fact sheets that you were given: Pain Booklet, Coughing and Deep Breathing and Surgical Site Infection Prevention

## 2013-09-14 ENCOUNTER — Encounter (HOSPITAL_COMMUNITY): Payer: Self-pay

## 2013-09-14 ENCOUNTER — Ambulatory Visit (HOSPITAL_COMMUNITY)
Admission: RE | Admit: 2013-09-14 | Discharge: 2013-09-14 | Disposition: A | Discharge status: Home / Self Care | Payer: Medicaid Other | Source: Ambulatory Visit | Attending: Hematology & Oncology | Admitting: Hematology & Oncology

## 2013-09-14 DIAGNOSIS — R918 Other nonspecific abnormal finding of lung field: Secondary | ICD-10-CM

## 2013-09-14 DIAGNOSIS — C12 Malignant neoplasm of pyriform sinus: Secondary | ICD-10-CM | POA: Insufficient documentation

## 2013-09-14 LAB — GLUCOSE, CAPILLARY: GLUCOSE-CAPILLARY: 84 mg/dL (ref 70–99)

## 2013-09-14 MED ORDER — FLUDEOXYGLUCOSE F - 18 (FDG) INJECTION
19.1000 | Freq: Once | INTRAVENOUS | Status: AC | PRN
  Administered 2013-09-14: 19.1 via INTRAVENOUS

## 2013-09-14 NOTE — Progress Notes (Signed)
Anesthesia Chart Review:  Patient is a 63 year old male scheduled for direct laryngoscopy with biopsy/esophagoscopy on 09/15/13 by Dr. Redmond Baseman.  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 PCP is listed as Dr. Laurence Spates.  EKG on 09/13/13 showed NSR, possible LAE, cannot rule out inferior infarct (age undetermined).  He had a normal nuclear stress test on 10/09/09 with EF 57% (found under Encounter tab). This was done as part of an evaluation for possible  esophagectomy. (Comparison EKG found in [facility], but under his old MRN [id number]--KGYJ name, DOB, SSN.)  The comparison EKG from 12/09/01 appears similar to current EKG.  Preoperative CXR and labs noted.  No CV symptoms documented at PAT.  His EKG appears stable with negative stress test in 2011.  If no acute changes then I would anticipate that he could proceed as planned.  [facility] Short Stay Center/Anesthesiology Phone [phone] 09/14/2013 10:12 AM

## 2013-09-15 ENCOUNTER — Encounter (HOSPITAL_COMMUNITY)
Admission: RE | Disposition: A | Discharge status: Home / Self Care | Payer: Self-pay | Source: Ambulatory Visit | Attending: Otolaryngology

## 2013-09-15 ENCOUNTER — Encounter (HOSPITAL_COMMUNITY): Payer: Self-pay | Admitting: Certified Registered"

## 2013-09-15 ENCOUNTER — Ambulatory Visit (HOSPITAL_COMMUNITY): Payer: Medicaid Other | Admitting: Anesthesiology

## 2013-09-15 ENCOUNTER — Encounter (HOSPITAL_COMMUNITY): Payer: Medicaid Other | Admitting: Vascular Surgery

## 2013-09-15 ENCOUNTER — Ambulatory Visit (HOSPITAL_COMMUNITY)
Admission: RE | Admit: 2013-09-15 | Discharge: 2013-09-15 | Disposition: A | Discharge status: Home / Self Care | Payer: Medicaid Other | Source: Ambulatory Visit | Attending: Otolaryngology | Admitting: Otolaryngology

## 2013-09-15 DIAGNOSIS — Z8501 Personal history of malignant neoplasm of esophagus: Secondary | ICD-10-CM | POA: Insufficient documentation

## 2013-09-15 DIAGNOSIS — E785 Hyperlipidemia, unspecified: Secondary | ICD-10-CM | POA: Insufficient documentation

## 2013-09-15 DIAGNOSIS — Z923 Personal history of irradiation: Secondary | ICD-10-CM | POA: Insufficient documentation

## 2013-09-15 DIAGNOSIS — Z01810 Encounter for preprocedural cardiovascular examination: Secondary | ICD-10-CM | POA: Insufficient documentation

## 2013-09-15 DIAGNOSIS — K219 Gastro-esophageal reflux disease without esophagitis: Secondary | ICD-10-CM | POA: Insufficient documentation

## 2013-09-15 DIAGNOSIS — C12 Malignant neoplasm of pyriform sinus: Secondary | ICD-10-CM | POA: Insufficient documentation

## 2013-09-15 DIAGNOSIS — Z01812 Encounter for preprocedural laboratory examination: Secondary | ICD-10-CM | POA: Insufficient documentation

## 2013-09-15 DIAGNOSIS — Z87891 Personal history of nicotine dependence: Secondary | ICD-10-CM | POA: Insufficient documentation

## 2013-09-15 DIAGNOSIS — Z01818 Encounter for other preprocedural examination: Secondary | ICD-10-CM | POA: Insufficient documentation

## 2013-09-15 DIAGNOSIS — I10 Essential (primary) hypertension: Secondary | ICD-10-CM | POA: Insufficient documentation

## 2013-09-15 HISTORY — PX: LARYNGOSCOPY: SHX5203

## 2013-09-15 SURGERY — LARYNGOSCOPY
Anesthesia: General

## 2013-09-15 MED ORDER — FENTANYL CITRATE 0.05 MG/ML IJ SOLN
INTRAMUSCULAR | Status: DC | PRN
  Administered 2013-09-15: 100 ug via INTRAVENOUS
  Administered 2013-09-15: 50 ug via INTRAVENOUS

## 2013-09-15 MED ORDER — SUCCINYLCHOLINE CHLORIDE 20 MG/ML IJ SOLN
INTRAMUSCULAR | Status: DC | PRN
  Administered 2013-09-15: 100 mg via INTRAVENOUS

## 2013-09-15 MED ORDER — OXYMETAZOLINE HCL 0.05 % NA SOLN
NASAL | Status: AC
  Filled 2013-09-15: qty 15

## 2013-09-15 MED ORDER — ONDANSETRON HCL 4 MG/2ML IJ SOLN
INTRAMUSCULAR | Status: DC | PRN
  Administered 2013-09-15: 4 mg via INTRAVENOUS

## 2013-09-15 MED ORDER — LIDOCAINE HCL (CARDIAC) 20 MG/ML IV SOLN
INTRAVENOUS | Status: DC | PRN
  Administered 2013-09-15: 60 mg via INTRAVENOUS

## 2013-09-15 MED ORDER — ACETAMINOPHEN 325 MG PO TABS: 325.0000 mg | ORAL_TABLET | ORAL | Status: DC | PRN

## 2013-09-15 MED ORDER — ACETAMINOPHEN 160 MG/5ML PO SOLN: 325.0000 mg | ORAL | Status: DC | PRN

## 2013-09-15 MED ORDER — HYDROMORPHONE HCL PF 1 MG/ML IJ SOLN: 0.2500 mg | INTRAMUSCULAR | Status: DC | PRN

## 2013-09-15 MED ORDER — 0.9 % SODIUM CHLORIDE (POUR BTL) OPTIME
TOPICAL | Status: DC | PRN
  Administered 2013-09-15: 1000 mL

## 2013-09-15 MED ORDER — OXYCODONE HCL 5 MG PO TABS: 5.0000 mg | ORAL_TABLET | Freq: Once | ORAL | Status: DC | PRN

## 2013-09-15 MED ORDER — EPINEPHRINE HCL (NASAL) 0.1 % NA SOLN
NASAL | Status: AC
  Filled 2013-09-15: qty 30

## 2013-09-15 MED ORDER — MIDAZOLAM HCL 5 MG/5ML IJ SOLN
INTRAMUSCULAR | Status: DC | PRN
  Administered 2013-09-15: 1 mg via INTRAVENOUS

## 2013-09-15 MED ORDER — LACTATED RINGERS IV SOLN
INTRAVENOUS | Status: DC
  Administered 2013-09-15: 09:00:00 via INTRAVENOUS

## 2013-09-15 MED ORDER — OXYCODONE HCL 5 MG/5ML PO SOLN: 5.0000 mg | Freq: Once | ORAL | Status: DC | PRN

## 2013-09-15 MED ORDER — HYDROCODONE-ACETAMINOPHEN 5-325 MG PO TABS: 1.0000 | ORAL_TABLET | ORAL | Status: DC | PRN

## 2013-09-15 MED ORDER — ONDANSETRON HCL 4 MG/2ML IJ SOLN: 4.0000 mg | Freq: Once | INTRAMUSCULAR | Status: DC | PRN

## 2013-09-15 MED ORDER — PROPOFOL 10 MG/ML IV BOLUS
INTRAVENOUS | Status: DC | PRN
  Administered 2013-09-15: 100 mg via INTRAVENOUS
  Administered 2013-09-15: 50 mg via INTRAVENOUS

## 2013-09-15 SURGICAL SUPPLY — 33 items
BALLN PULM 15 16.5 18X75 (BALLOONS)
BALLOON PULM 15 16.5 18X75 (BALLOONS) IMPLANT
CANISTER SUCTION 2500CC (MISCELLANEOUS) ×2 IMPLANT
CLOTH BEACON ORANGE TIMEOUT ST (SAFETY) ×1 IMPLANT
CONT SPEC 4OZ CLIKSEAL STRL BL (MISCELLANEOUS) ×1 IMPLANT
COVER MAYO STAND STRL (DRAPES) ×2 IMPLANT
COVER TABLE BACK 60X90 (DRAPES) ×2 IMPLANT
CRADLE DONUT ADULT HEAD (MISCELLANEOUS) IMPLANT
DEPRESSOR TONGUE BLADE STERILE (MISCELLANEOUS) IMPLANT
DRAPE PROXIMA HALF (DRAPES) ×2 IMPLANT
GAUZE SPONGE 4X4 16PLY XRAY LF (GAUZE/BANDAGES/DRESSINGS) ×2 IMPLANT
GLOVE BIO SURGEON STRL SZ7.5 (GLOVE) ×2 IMPLANT
GLOVE SURG SS PI 7.0 STRL IVOR (GLOVE) ×1 IMPLANT
GOWN STRL NON-REIN LRG LVL3 (GOWN DISPOSABLE) ×1 IMPLANT
GUARD TEETH (MISCELLANEOUS) IMPLANT
KIT BASIN OR (CUSTOM PROCEDURE TRAY) ×2 IMPLANT
KIT ROOM TURNOVER OR (KITS) ×2 IMPLANT
MARKER SKIN DUAL TIP RULER LAB (MISCELLANEOUS) IMPLANT
NDL TRANS ORAL INJECTION (NEEDLE) ×1 IMPLANT
NEEDLE TRANS ORAL INJECTION (NEEDLE) IMPLANT
NS IRRIG 1000ML POUR BTL (IV SOLUTION) ×2 IMPLANT
PAD ARMBOARD 7.5X6 YLW CONV (MISCELLANEOUS) ×3 IMPLANT
PAD EYE OVAL STERILE LF (GAUZE/BANDAGES/DRESSINGS) IMPLANT
PATTIES SURGICAL .5 X1 (DISPOSABLE) ×1 IMPLANT
PATTIES SURGICAL .5 X3 (DISPOSABLE) IMPLANT
SOLUTION ANTI FOG 6CC (MISCELLANEOUS) ×1 IMPLANT
SPONGE GAUZE 4X4 12PLY (GAUZE/BANDAGES/DRESSINGS) IMPLANT
STAPLER TA60 3.5 010317 (STAPLE) IMPLANT
SUT SILK 2 0 SH (SUTURE) IMPLANT
SYR INFLATE BILIARY GAUGE (MISCELLANEOUS) IMPLANT
TOWEL OR 17X24 6PK STRL BLUE (TOWEL DISPOSABLE) ×2 IMPLANT
TUBE CONNECTING 12X1/4 (SUCTIONS) ×2 IMPLANT
WATER STERILE IRR 1000ML POUR (IV SOLUTION) ×1 IMPLANT

## 2013-09-15 NOTE — Brief Op Note (Signed)
09/15/2013  11:34 AM  PATIENT:  [name]  63 y.o. male  PRE-OPERATIVE DIAGNOSIS:  Pyriform sinus cancer [148.1]  POST-OPERATIVE DIAGNOSIS:  same  PROCEDURE:  Procedure(s) with comments: LARYNGOSCOPY (N/A) - direct laryngoscopy with biopsy and esophagoscopy  SURGEON:  Surgeon(s) and Role:    * [name], MD - Primary  PHYSICIAN ASSISTANT:   ASSISTANTS: none   ANESTHESIA:   general  EBL:     BLOOD ADMINISTERED:none  DRAINS: none   LOCAL MEDICATIONS USED:  NONE  SPECIMEN:  Source of Specimen:  left lateral pyriform sinus cancer  DISPOSITION OF SPECIMEN:  PATHOLOGY  COUNTS:  YES  TOURNIQUET:  * No tourniquets in log *  DICTATION: .Other Dictation: Dictation Number [id number]  PLAN OF CARE: Discharge to home after PACU  PATIENT DISPOSITION:  PACU - hemodynamically stable.   Delay start of Pharmacological VTE agent (>24hrs) due to surgical blood loss or risk of bleeding: yes

## 2013-09-15 NOTE — Anesthesia Preprocedure Evaluation (Signed)
Anesthesia Evaluation  Patient identified by MRN, date of birth, ID band Patient awake    Reviewed: Allergy & Precautions, H&P , NPO status , Patient's Chart, lab work & pertinent test results  History of Anesthesia Complications Negative for: history of anesthetic complications  Airway Mallampati: II TM Distance: >3 FB Neck ROM: Full    Dental  (+) Edentulous Upper and Edentulous Lower   Pulmonary former smoker,    Pulmonary exam normal       Cardiovascular Exercise Tolerance: Good hypertension, Pt. on medications - angina- Past MI Rhythm:Regular Rate:Normal     Neuro/Psych Seizures -,  Seizures with ETOH withdrawal, last was in 2000, no longer actively drinking ETOH negative psych ROS   GI/Hepatic Neg liver ROS, GERD-  Controlled,  Endo/Other  negative endocrine ROS  Renal/GU negative Renal ROS  negative genitourinary   Musculoskeletal negative musculoskeletal ROS (+)   Abdominal   Peds  Hematology negative hematology ROS (+)   Anesthesia Other Findings   Reproductive/Obstetrics                           Anesthesia Physical Anesthesia Plan  ASA: II  Anesthesia Plan: General   Post-op Pain Management:    Induction: Intravenous  Airway Management Planned: Oral ETT  Additional Equipment: None  Intra-op Plan:   Post-operative Plan: Extubation in OR  Informed Consent: I have reviewed the patients History and Physical, chart, labs and discussed the procedure including the risks, benefits and alternatives for the proposed anesthesia with the patient or authorized representative who has indicated his/her understanding and acceptance.   Dental advisory given  Plan Discussed with: CRNA and Surgeon  Anesthesia Plan Comments:         Anesthesia Quick Evaluation

## 2013-09-15 NOTE — Anesthesia Procedure Notes (Addendum)
Procedure Name: Intubation Date/Time: 09/15/2013 11:13 AM Performed by: Julian Reil Pre-anesthesia Checklist: Patient identified, Emergency Drugs available, Suction available and Patient being monitored Patient Re-evaluated:Patient Re-evaluated prior to inductionOxygen Delivery Method: Circle system utilized Preoxygenation: Pre-oxygenation with 100% oxygen Intubation Type: IV induction Ventilation: Mask ventilation without difficulty and Oral airway inserted - appropriate to patient size Laryngoscope Size: Mac and 4 Grade View: Grade I Tube type: Oral Tube size: 6.5 mm Number of attempts: 1 Airway Equipment and Method: Stylet Placement Confirmation: ETT inserted through vocal cords under direct vision,  positive ETCO2 and breath sounds checked- equal and bilateral Secured at: 22 cm Tube secured with: Tape Dental Injury: Teeth and Oropharynx as per pre-operative assessment

## 2013-09-15 NOTE — Transfer of Care (Signed)
Immediate Anesthesia Transfer of Care Note  Patient: Collin Young  Procedure(s) Performed: Procedure(s) with comments: LARYNGOSCOPY (N/A) - direct laryngoscopy with biopsy and esophagoscopy  Patient Location: PACU  Anesthesia Type:General  Level of Consciousness: awake, alert , oriented and patient cooperative  Airway & Oxygen Therapy: Patient Spontanous Breathing and Patient connected to nasal cannula oxygen  Post-op Assessment: Report given to PACU RN, Post -op Vital signs reviewed and stable and Patient moving all extremities  Post vital signs: Reviewed and stable  Complications: No apparent anesthesia complications

## 2013-09-15 NOTE — Preoperative (Signed)
Beta Blockers   Reason not to administer Beta Blockers:Not Applicable 

## 2013-09-15 NOTE — H&P (Signed)
Collin Young is an 63 y.o. male.   Chief Complaint: pyriform sinus cancer HPI: 63 year old male with esophageal cancer four years ago treated with radiation treatment.  Since early summer, he has noticed excess throat phlegm and his voice sounds different.  He has noticed left-sided throat pain lately with radiation to the left ear.  His oncologist ordered imaging that demonstrated a left-sided pyriform sinus mass that was confirmed with fiberoptic laryngoscopy in the office.  He presents for surgical biopsy.  Past Medical History  Diagnosis Date  . Hypertension   . Hyperlipemia   . HOH (hard of hearing)   . Seizures     alcohol withdrawls- 2001  . GERD (gastroesophageal reflux disease)     takes zantac prn  . Cancer     Esophagus- radiation 2011    Past Surgical History  Procedure Laterality Date  . No past surgeries    . Cardiovascular stress test  10/09/09    normal nuclearr stress test, EF 57% Collin Young)    No family history on file. Social History:  reports that he has quit smoking. He does not have any smokeless tobacco history on file. He reports that he does not drink alcohol or use illicit drugs.  Allergies: No Known Allergies  Medications Prior to Admission  Medication Sig Dispense Refill  . amLODipine-benazepril (LOTREL) 5-10 MG per capsule Take 1 capsule by mouth daily.  30 capsule  3  . atorvastatin (LIPITOR) 10 MG tablet Take 20 mg by mouth daily.         Results for orders placed during the hospital encounter of 09/14/13 (from the past 48 hour(s))  GLUCOSE, CAPILLARY     Status: None   Collection Time    09/14/13  1:22 PM      Result Value Range   Glucose-Capillary 84  70 - 99 mg/dL   Dg Chest 2 View  09/13/2013   CLINICAL DATA:  Preop for laryngoscopy  EXAM: CHEST  2 VIEW  COMPARISON:  None.  FINDINGS: Cardiomediastinal silhouette is unremarkable. No acute infiltrate or pleural effusion. No pulmonary edema. Bony thorax is unremarkable.  IMPRESSION: No  active cardiopulmonary disease.   Electronically Signed   By: Collin Young M.D.   On: 09/13/2013 13:10   Nm Pet Image Restag (ps) Skull Base To Thigh  09/14/2013   CLINICAL DATA:  Initial treatment strategy for head and neck cancer. Piriform sinuses carcinoma.  EXAM: NUCLEAR MEDICINE PET SKULL BASE TO THIGH  FASTING BLOOD GLUCOSE:  Value: 84mg /dl  TECHNIQUE: 19.1 mCi F-18 FDG was injected intravenously. CT data was obtained and used for attenuation correction and anatomic localization only. (This was not acquired as a diagnostic CT examination.) Additional exam technical data entered on technologist worksheet.  COMPARISON:  SP REMOVAL TUN ACCESS W/ PORT W/O FL dated 08/20/2010; CT NECK W/CM dated 09/07/2013; NM PET IMAGE RESTAG (PS) SKULL BASE TO THIGH dated 08/08/2010  FINDINGS: NECK  There is a hypermetabolic mass centered within the left piriform sinus with SUV max 25.3. This mass is better delineated on the comparison diagnostic contrast CT of the neck but measures approximately 23 x 23 mm (image number 48) just superior to the glottis and medial to the thyroid cartilage). There are no hypermetabolic lymph nodes in the the cervical chain. No posterior triangle hypermetabolic nodes. No supraclavicular hypermetabolic lymph nodes  CHEST  No hypermetabolic mediastinal lymph nodes. No suspicious pulmonary nodules. 5 mm right upper lobe pulmonary nodule (image 75) does  not have associated metabolic activity. 4 mm right lower lobe pulmonary nodule without metabolic activity. (Image 101).  ABDOMEN/PELVIS  No abnormal hypermetabolic activity within the liver, pancreas, adrenal glands, or spleen. No hypermetabolic lymph nodes in the abdomen or pelvis. No abnormal activity through the esophagus.  SKELETON  No focal hypermetabolic activity to suggest skeletal metastasis.  IMPRESSION: 1. Hypermetabolic mass centered in the left piriform sinus consists with primary head neck cancer. 2. No evidence of nodal metastasis in the  neck. 3. Small pulmonary nodules are non specific. Recommend attention on follow-up. 4. No abnormal activity associated with the esophagus.   Electronically Signed   By: Collin Young M.D.   On: 09/14/2013 16:43    Review of Systems  HENT: Positive for ear pain and sore throat.   All other systems reviewed and are negative.    Blood pressure 173/74, pulse 65, temperature 98.2 F (36.8 C), temperature source Oral, resp. rate 18, SpO2 100.00%. Physical Exam  Constitutional: He is oriented to person, place, and time. He appears well-developed and well-nourished. No distress.  HENT:  Head: Normocephalic and atraumatic.  Right Ear: External ear normal.  Left Ear: External ear normal.  Nose: Nose normal.  Mouth/Throat: Oropharynx is clear and moist.  Eyes: Conjunctivae and EOM are normal. Pupils are equal, round, and reactive to light.  Neck: Normal range of motion. Neck supple.  Cardiovascular: Normal rate.   Respiratory: Effort normal.  GI:  Did not examine.  Genitourinary:  Did not examine.  Musculoskeletal: Normal range of motion.  Neurological: He is alert and oriented to person, place, and time. No cranial nerve deficit.  Skin: Skin is warm and dry.  Psychiatric: He has a normal mood and affect. His behavior is normal. Judgment and thought content normal.     Assessment/Plan Left pyriform sinus mass To OR for direct laryngoscopy with biopsy and esophagoscopy.  Collin Young 09/15/2013, 10:51 AM

## 2013-09-15 NOTE — Anesthesia Postprocedure Evaluation (Signed)
  Anesthesia Post-op Note  Patient: Collin Young  Procedure(s) Performed: Procedure(s) with comments: LARYNGOSCOPY (N/A) - direct laryngoscopy with biopsy and esophagoscopy  Patient Location: PACU  Anesthesia Type:General  Level of Consciousness: awake, alert  and oriented  Airway and Oxygen Therapy: Patient Spontanous Breathing  Post-op Pain: mild  Post-op Assessment: Post-op Vital signs reviewed, Patient's Cardiovascular Status Stable, Respiratory Function Stable, Patent Airway, No signs of Nausea or vomiting and Pain level controlled  Post-op Vital Signs: Reviewed and stable  Complications: No apparent anesthesia complications

## 2013-09-16 NOTE — Op Note (Signed)
Collin Young, Collin Young NO.:  [id number]  MEDICAL RECORD NO.:  [id number]  LOCATION:  [facility]                         FACILITY:  [facility]  PHYSICIAN:  Onnie Graham, MD     DATE OF BIRTH:  18-Oct-1950  DATE OF PROCEDURE:  09/15/2013 DATE OF DISCHARGE:  09/15/2013                              OPERATIVE REPORT   PREOPERATIVE DIAGNOSIS:  Left piriform sinus mass.  POSTOPERATIVE DIAGNOSIS:  Left piriform sinus mass.  PROCEDURE: 1. Direct laryngoscopy with biopsy. 2. Esophagoscopy.  SURGEON:  Onnie Graham, MD  ANESTHESIA:  General endotracheal anesthesia.  COMPLICATIONS:  None.  INDICATION:  The patient is a 63 year old male with a history of esophageal cancer 4 years ago treated with radiation therapy.  He has recently noticed a sore throat radiating to the left ear and a bit of a change in voice and presented to his oncologist who ordered imaging that demonstrated a left piriform sinus mass.  This was also reviewed in the office by laryngoscopy.  He presents to the operating room for surgical biopsy.  FINDINGS:  Upon laryngoscopy, the fungating mass was identified as involving only the lateral wall of the left piriform sinus.  It did not extend into the medial wall or onto the larynx.  It did not extend into the upper esophagus.  Biopsies were taken from the mass.  The esophagus appeared normal on esophagoscopy and the remainder of the laryngoscopy was normal.  DESCRIPTION OF PROCEDURE:  The patient was identified in the holding room and informed consent having been obtained including discussion of risks, benefits, alternatives, the patient was brought to the operative suite and put on the operative table in supine position.  Anesthesia was induced and the patient was intubated by anesthesia team without difficulty.  The eyes were taped closed and bed was turned 90 degrees from anesthesia.  A damp gauze was placed over the superior alveolar ridge, and a Dedo  laryngoscope was inserted to view the various areas of the pharynx and larynx including the vallecula, piriform sinuses, post cricoid region, and endo larynx.  Findings were noted above.  The laryngoscope was removed.  A rigid esophagoscope was then passed through the pharynx into the upper esophagus and passed down the esophagus keeping the lumen in view and suctioning secretions.  It was passed to the distal third of the esophagus and was then slowly backed out examining esophageal wall all the way until it was in the pharynx. Findings as noted above.  The laryngoscope was reinserted and biopsies were taken from the left lateral piriform sinus mass using cupped forceps and placed in saline for permanent pathology.  The throat was then suctioned and the laryngoscope was removed.  He was returned to anesthesia for wake-up, was extubated and moved to recovery room in stable condition.     Onnie Graham, MD     DDB/MEDQ  D:  09/15/2013  T:  09/16/2013  Job:  [id number]

## 2013-09-19 ENCOUNTER — Encounter (HOSPITAL_COMMUNITY): Payer: Self-pay | Admitting: Otolaryngology

## 2013-09-20 ENCOUNTER — Telehealth: Payer: Self-pay | Admitting: Hematology & Oncology

## 2013-09-20 NOTE — Telephone Encounter (Signed)
Received call from RN Dr. Marin Olp wants to see on 1-22 at 4 pm. RN will let MD know and let me know when to schedule. Dorian Pod RN is also aware to ask MD when to schedule pt.

## 2013-09-22 ENCOUNTER — Ambulatory Visit (HOSPITAL_BASED_OUTPATIENT_CLINIC_OR_DEPARTMENT_OTHER): Payer: Medicaid Other | Admitting: Hematology & Oncology

## 2013-09-22 VITALS — BP 144/74 | HR 79 | Temp 98.4°F | Resp 18 | Wt 145.0 lb

## 2013-09-22 DIAGNOSIS — C12 Malignant neoplasm of pyriform sinus: Secondary | ICD-10-CM

## 2013-09-22 DIAGNOSIS — D490 Neoplasm of unspecified behavior of digestive system: Secondary | ICD-10-CM

## 2013-09-22 DIAGNOSIS — Z8501 Personal history of malignant neoplasm of esophagus: Secondary | ICD-10-CM

## 2013-09-23 ENCOUNTER — Ambulatory Visit (HOSPITAL_BASED_OUTPATIENT_CLINIC_OR_DEPARTMENT_OTHER)
Admission: RE | Admit: 2013-09-23 | Discharge: 2013-09-23 | Disposition: A | Discharge status: Home / Self Care | Payer: Medicaid Other | Source: Ambulatory Visit | Attending: Hematology & Oncology | Admitting: Hematology & Oncology

## 2013-09-23 DIAGNOSIS — Z8501 Personal history of malignant neoplasm of esophagus: Secondary | ICD-10-CM | POA: Insufficient documentation

## 2013-09-23 DIAGNOSIS — D490 Neoplasm of unspecified behavior of digestive system: Secondary | ICD-10-CM

## 2013-09-23 DIAGNOSIS — R220 Localized swelling, mass and lump, head: Secondary | ICD-10-CM | POA: Insufficient documentation

## 2013-09-23 DIAGNOSIS — R221 Localized swelling, mass and lump, neck: Secondary | ICD-10-CM

## 2013-09-23 DIAGNOSIS — M542 Cervicalgia: Secondary | ICD-10-CM | POA: Insufficient documentation

## 2013-09-23 MED ORDER — GADOBENATE DIMEGLUMINE 529 MG/ML IV SOLN: 10.0000 mL | Freq: Once | INTRAVENOUS | Status: AC | PRN

## 2013-09-24 DIAGNOSIS — D490 Neoplasm of unspecified behavior of digestive system: Secondary | ICD-10-CM | POA: Insufficient documentation

## 2013-09-24 NOTE — Addendum Note (Signed)
Addended by: [name] on: 09/24/2013 02:35 PM   Modules accepted: Orders

## 2013-09-24 NOTE — Progress Notes (Signed)
This office note has been dictated.

## 2013-09-25 ENCOUNTER — Other Ambulatory Visit: Payer: Self-pay | Admitting: *Deleted

## 2013-09-25 ENCOUNTER — Telehealth: Payer: Self-pay | Admitting: *Deleted

## 2013-09-25 ENCOUNTER — Ambulatory Visit: Payer: Self-pay | Admitting: Hematology & Oncology

## 2013-09-25 ENCOUNTER — Other Ambulatory Visit: Payer: Self-pay | Admitting: Lab

## 2013-09-25 ENCOUNTER — Other Ambulatory Visit: Payer: Self-pay | Admitting: Radiology

## 2013-09-25 NOTE — Telephone Encounter (Signed)
Called pathology to ask them to run HPV assay on patients recent throat mass biopsy. Agreed they will do it.

## 2013-09-25 NOTE — Progress Notes (Signed)
CC:   Collin Young, M.D.  DIAGNOSES: 1. 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 2. History of stage IIIB squamous cell carcinoma of the esophagus.  CURRENT THERAPY:  Observation.  INTERIM HISTORY:  Collin Young comes in for followup.  Unfortunately, we now have a second primary malignancy.  It looks like he has stage II piriform sinus cancer.  When we saw him last year, he was complaining of some dysphagia.  He notes some slight fullness on the left side of his neck.  His workup showed a piriform sinus mass.  We went ahead and worked this up.  He had a PET scan done.  The PET scan was done on January 15th.  PET scan showed a 2.3 x 2.3 cm mass that was a quite hypermetabolic.  This was superior to the epiglottis and medial to the thyroid cartilage.  There is no hypermetabolic lymph nodes.  No hypermetabolic lymph nodes were noted in the lungs.  He had a 4 mm pulmonary nodule which were not felt to be suspicious.  I did get a MRI of his neck.  This is actually done on the 24th.  This showed a 2.1 x 2.2 x 2.9 cm mass in the left piriform sinus.  It involved the left area of epiglottic fold.  There is some extension to the left periglottic region with invasion of the periglottic fat planes. No pathologic lymph nodes were noted.  No pulmonary nodules were noted in the lungs.  He is swallowing fairly well.  He is not having a lot of pain.  We sent him to Dr. Redmond Young of ENT.  Dr. Redmond Young did an endoscopy on him. Biopsies were done.  The pathology report ([id number]) showed a squamous cell carcinoma of the left piriform sinus.  We will have to see about running this for HPV studies.  Collin Young has lost little bit of weight.  Again, he seems to be eating fairly well.  He is having no issues with pain outside, maybe some referred pain to the left ear.  PHYSICAL EXAMINATION:  General:  This is a fairly well-developed, well- nourished African  American gentleman in no obvious distress.  Vital Signs:  Temperature 98.4, pulse 79, respiratory rate 18, blood pressure 144/74.  Weight is 145 pounds.  Head and Neck:  Normocephalic, atraumatic skull.  There are no ocular or oral lesions.  There may be some slight fullness in the left mid neck.  No adenopathy noted in the neck.  Lungs:  Clear bilaterally.  Cardiac:  Regular rhythm with normal S1, S2.  There are no murmurs, rubs, or bruits.  Abdomen:  Soft.  He has good bowel sounds.  There is no fluid wave.  There is no palpable hepatosplenomegaly.  Extremities:  Show no clubbing, cyanosis, or edema. He has good strength in his arms and legs.  Neurologic:  No focal neurological deficits.  Lab studies were not done in this visit.  IMPRESSION:  Collin Young is a very nice 63 year old gentleman.  It looks like he has a new squamous cell carcinoma of the left piriform sinus.  He did have a past history of tobacco and alcohol use.  I would think that he is probably HPV negative.  However, we will go ahead and test this.  I think that chemo and radiation therapy would be an appropriate mode of therapy for him.  We will see about referring him to Radiation Oncology due  to concurrent chemotherapy and radiation therapy.  Again, it looks like he has stage II disease.  I will have to see what the radiation oncologist think.  Again, we will send the specimen off for HPV testing.  Collin Young will need to have a Port-A-Cath placed.  I think this might be helpful for [place] during treatments.  Again, we are planning to coordinate with Radiation Oncology our current program.  I will plan to get Collin Young back to see [place] once we know when radiation therapy will start.  I spent a good hour with Collin Young and his daughter and answered all of their questions.  We have done all the staging studies that we needed to do right now.  I would like to think that we are in a good position to try  and cure this malignancy.  His esophageal cancer is cured from my point of view.    ______________________________ Collin Young, M.D. PRE/MEDQ  D:  09/24/2013  T:  09/25/2013  Job:  [id number]

## 2013-09-25 NOTE — Progress Notes (Signed)
Opened encounter to cancel Port placement per dr. Marin Olp . Dr. Marin Olp changed his mind.  Port to still be placed

## 2013-09-26 ENCOUNTER — Encounter (HOSPITAL_COMMUNITY): Payer: Self-pay | Admitting: Pharmacy Technician

## 2013-09-27 NOTE — Progress Notes (Signed)
Head and Neck Cancer Location of Tumor / Histology: Piriform sinus cancer 2nd primary,( hx   Esophagus stage IIIB squamous cell ca 2010)  Patient presented  months ago with symptoms of:  Dysphasia ,pain left side neck extending to the left ear,pain with swelling,pain 1 year, pain for past year-  Biopsies of  (if applicable) revealed: Diagnosis 09/15/13:Pyriform sinus, biopsy, Left lateral pyriform sinus- SQUAMOUS CELL CARCINOMA. Dr. [name]  Nutrition Status:  Weight changes: lost some   Swallowing status: fairly well  Plans, if any, for PEG tube:   Tobacco/Marijuana/Snuff/ETOH use: hx tobacco 2-3ppd , hx y alcohol usestopped years ago, also did heroin and crack cocaine  Past/Anticipated interventions by otolaryngology, if any:   Past/Anticipated interventions by medical oncology, if any:  , past chemotherapy  2010,  have port  Placement  09/28/13, MRI 09/23/13 head/neck results in   Referrals yet, to any of the following?  Social Work? no  Dentistry? no  Swallowing therapy?   Nutrition? no  Med/Onc? Dr.Ennever HOHt   PEG placement? no  SAFETY ISSUES:  Prior radiation? Yes, 06/07/09-07/16/09 within thoracic esophagus   Pacemaker/ICD? No   Is the patient on methotrexate? no Current Complaints / other details:  HOH, hx seizures(alcohol w/d 2001),  Note. Scheduled for porta-cath placement today 09/28/2013.

## 2013-09-28 ENCOUNTER — Ambulatory Visit
Admission: RE | Admit: 2013-09-28 | Discharge: 2013-09-28 | Disposition: A | Discharge status: Home / Self Care | Payer: Medicaid Other | Source: Ambulatory Visit | Attending: Radiation Oncology | Admitting: Radiation Oncology

## 2013-09-28 ENCOUNTER — Other Ambulatory Visit: Payer: Self-pay | Admitting: Hematology & Oncology

## 2013-09-28 ENCOUNTER — Other Ambulatory Visit (HOSPITAL_COMMUNITY): Payer: Self-pay

## 2013-09-28 ENCOUNTER — Encounter: Payer: Self-pay | Admitting: *Deleted

## 2013-09-28 ENCOUNTER — Telehealth: Payer: Self-pay

## 2013-09-28 ENCOUNTER — Ambulatory Visit (HOSPITAL_COMMUNITY)
Admission: RE | Admit: 2013-09-28 | Discharge: 2013-09-28 | Disposition: A | Discharge status: Home / Self Care | Payer: Medicaid Other | Source: Ambulatory Visit | Attending: Hematology & Oncology | Admitting: Hematology & Oncology

## 2013-09-28 ENCOUNTER — Encounter (HOSPITAL_COMMUNITY): Payer: Self-pay

## 2013-09-28 VITALS — BP 160/85 | HR 75 | Temp 98.3°F | Wt 144.2 lb

## 2013-09-28 DIAGNOSIS — I10 Essential (primary) hypertension: Secondary | ICD-10-CM | POA: Insufficient documentation

## 2013-09-28 DIAGNOSIS — E785 Hyperlipidemia, unspecified: Secondary | ICD-10-CM | POA: Insufficient documentation

## 2013-09-28 DIAGNOSIS — C12 Malignant neoplasm of pyriform sinus: Secondary | ICD-10-CM | POA: Insufficient documentation

## 2013-09-28 DIAGNOSIS — Z79899 Other long term (current) drug therapy: Secondary | ICD-10-CM | POA: Insufficient documentation

## 2013-09-28 DIAGNOSIS — K219 Gastro-esophageal reflux disease without esophagitis: Secondary | ICD-10-CM | POA: Insufficient documentation

## 2013-09-28 DIAGNOSIS — D490 Neoplasm of unspecified behavior of digestive system: Secondary | ICD-10-CM

## 2013-09-28 DIAGNOSIS — Z87891 Personal history of nicotine dependence: Secondary | ICD-10-CM | POA: Insufficient documentation

## 2013-09-28 DIAGNOSIS — Z8501 Personal history of malignant neoplasm of esophagus: Secondary | ICD-10-CM | POA: Insufficient documentation

## 2013-09-28 DIAGNOSIS — Z923 Personal history of irradiation: Secondary | ICD-10-CM | POA: Insufficient documentation

## 2013-09-28 LAB — APTT: APTT: 27 s (ref 24–37)

## 2013-09-28 LAB — CBC WITH DIFFERENTIAL/PLATELET
BASOS ABS: 0 10*3/uL (ref 0.0–0.1)
Basophils Relative: 0 % (ref 0–1)
EOS ABS: 0.1 10*3/uL (ref 0.0–0.7)
EOS PCT: 1 % (ref 0–5)
HCT: 42.2 % (ref 39.0–52.0)
Hemoglobin: 14.3 g/dL (ref 13.0–17.0)
Lymphocytes Relative: 35 % (ref 12–46)
Lymphs Abs: 1.3 10*3/uL (ref 0.7–4.0)
MCH: 29 pg (ref 26.0–34.0)
MCHC: 33.9 g/dL (ref 30.0–36.0)
MCV: 85.6 fL (ref 78.0–100.0)
Monocytes Absolute: 0.4 10*3/uL (ref 0.1–1.0)
Monocytes Relative: 12 % (ref 3–12)
Neutro Abs: 1.9 10*3/uL (ref 1.7–7.7)
Neutrophils Relative %: 51 % (ref 43–77)
PLATELETS: 219 10*3/uL (ref 150–400)
RBC: 4.93 MIL/uL (ref 4.22–5.81)
RDW: 13.6 % (ref 11.5–15.5)
WBC: 3.7 10*3/uL — AB (ref 4.0–10.5)

## 2013-09-28 LAB — PROTIME-INR
INR: 0.93 (ref 0.00–1.49)
PROTHROMBIN TIME: 12.3 s (ref 11.6–15.2)

## 2013-09-28 MED ORDER — CEFAZOLIN SODIUM-DEXTROSE 2-3 GM-% IV SOLR
2.0000 g | Freq: Once | INTRAVENOUS | Status: AC
  Administered 2013-09-28: 2 g via INTRAVENOUS
  Filled 2013-09-28: qty 50

## 2013-09-28 MED ORDER — HEPARIN SOD (PORK) LOCK FLUSH 100 UNIT/ML IV SOLN
INTRAVENOUS | Status: AC
  Filled 2013-09-28: qty 5

## 2013-09-28 MED ORDER — FENTANYL CITRATE 0.05 MG/ML IJ SOLN
INTRAMUSCULAR | Status: AC | PRN
Start: 2013-09-28 — End: 2013-09-28
  Administered 2013-09-28: 100 ug via INTRAVENOUS

## 2013-09-28 MED ORDER — HEPARIN SOD (PORK) LOCK FLUSH 100 UNIT/ML IV SOLN
500.0000 [IU] | Freq: Once | INTRAVENOUS | Status: AC
  Administered 2013-09-28: 500 [IU] via INTRAVENOUS

## 2013-09-28 MED ORDER — SODIUM CHLORIDE 0.9 % IV SOLN
INTRAVENOUS | Status: DC
  Administered 2013-09-28: 13:00:00 via INTRAVENOUS

## 2013-09-28 MED ORDER — LARYNGOSCOPY SOLUTION RAD-ONC
15.0000 mL | Freq: Once | TOPICAL | Status: AC
  Administered 2013-09-28: 15 mL via TOPICAL
  Filled 2013-09-28: qty 15

## 2013-09-28 MED ORDER — FENTANYL CITRATE 0.05 MG/ML IJ SOLN
INTRAMUSCULAR | Status: AC
  Filled 2013-09-28: qty 4

## 2013-09-28 MED ORDER — MIDAZOLAM HCL 2 MG/2ML IJ SOLN
INTRAMUSCULAR | Status: AC
  Filled 2013-09-28: qty 4

## 2013-09-28 MED ORDER — MIDAZOLAM HCL 2 MG/2ML IJ SOLN
INTRAMUSCULAR | Status: AC | PRN
  Administered 2013-09-28: 2 mg via INTRAVENOUS

## 2013-09-28 NOTE — Telephone Encounter (Signed)
Spoke with Gayleen Orem  (navigator) In regards to introducing himself to patient as he has new diagnosis of pyriform sinus cancer.

## 2013-09-28 NOTE — Procedures (Signed)
R IJ Port cathter placement with US and fluoroscopy No complication No blood loss. See complete dictation in [facility] PACS.  

## 2013-09-28 NOTE — Progress Notes (Signed)
Met with patient in Short Stay prior to his [facility] placement.  Introduced myself as his Navigator, explained my role as a member of his Care Team.  He indicated understanding.    In support of discussion re: feeding tube, I showed him Internet picture, explained that PEG will be used for hydration/nutrition only if his oral intake is insufficient to maintain needs.  He indicated understanding.  Initiating navigation as L1 patient (new) with this encounter.  Gayleen Orem, RN, BSN, [facility] Neck Oncology Navigator [phone]

## 2013-09-28 NOTE — Progress Notes (Signed)
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 A diagnosis of Malignant neoplasm of pyriform sinus was also pertinent to this visit.   HISTORY OF PRESENT ILLNESS::Collin Young is a 63 y.o. male who is seen for an initial consultation visit. 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 He completed treatment in 2010. The patient has done well with this and has remained clinically NED. He notes that he has had some pain on swallowing has been present for a number of months. This was persisted and did worsen over time. This was associated with some dysphasia although he indicates that he is able to be most all foods he likes.   The patient's workup has included a CT scan of the neck 09/07/2013. A tumor was seen centered at the left pyriform sinus with some scattered normal sized lymph nodes. 2 small subcentimeter nodules were present within the right upper lung. The patient has also undergone a PET scan on 09/14/2013. The left pyriform sinus mass was hypermetabolic with a maximum SUV of 25.3. There is no evidence for nodal metastasis within the neck. The small pulmonary nodules were nonspecific with attention recommended on followup. The patient has also undergone an MRI scan neck. This showed a 2.9 cm mass within the piriform sinus involvement of the left area epiglottic fold. There is questionable invasion of the posterior aspect of the left thyroid cartilage. No pathologically enlarged lymph nodes were.  The patient has undergone endoscopy with biopsy through Dr. face. The tumor returned positive for squamous cell carcinoma from the left puriform sinus. HPV studies were  focally positive in a patchy fashion.   PREVIOUS RADIATION THERAPY: Yes as above for squamous carcinoma of the esophagus. I have had a chance to review his films and the superior aspect of the treatment extends to the lower neck.   PAST MEDICAL HISTORY:  has a past medical history of Hypertension; Hyperlipemia; HOH (hard of hearing); Seizures; GERD (gastroesophageal reflux disease); and Cancer.     PAST SURGICAL HISTORY: Past Surgical History  Procedure Laterality Date  . No past surgeries    . Cardiovascular stress test  10/09/09    normal nuclearr stress test, EF 57% [name])  . Laryngoscopy N/A 09/15/2013    Procedure: LARYNGOSCOPY;  Surgeon: [name], MD;  Location: [place];  Service: ENT;  Laterality: N/A;  direct laryngoscopy with biopsy and esophagoscopy     FAMILY HISTORY: family history is not on file.   SOCIAL HISTORY:  reports that he has quit smoking. He does not have any smokeless tobacco history on file. He reports that he does not drink alcohol or use illicit drugs.   ALLERGIES: Review of patient's allergies indicates no known allergies.   MEDICATIONS:  Current Outpatient Prescriptions  Medication Sig Dispense Refill  . amLODipine-benazepril (LOTREL) 5-10 MG per capsule Take 1 capsule by mouth every morning.      Marland Kitchen atorvastatin (LIPITOR) 10 MG tablet Take 10 mg by mouth every evening.        No current facility-administered medications for this encounter.   Facility-Administered Medications Ordered in Other Encounters  Medication Dose Route Frequency Provider Last Rate Last Dose  .  0.9 %  sodium chloride infusion   Intravenous Continuous Brayton El, PA-C      . ceFAZolin (ANCEF) IVPB 2 g/50 mL premix  2 g Intravenous Once Brayton El, PA-C         REVIEW OF SYSTEMS:  A 15 point review of systems is documented in the electronic medical record. This was obtained by the nursing staff. However, I reviewed this with the patient to discuss relevant findings  and make appropriate changes.  Pertinent items are noted in HPI.    PHYSICAL EXAM:  weight is 144 lb 3.2 oz (65.409 kg). His temperature is 98.3 F (36.8 C). His blood pressure is 160/85 and his pulse is 75. His oxygen saturation is 100%.   ECOG = 1  0 - Asymptomatic (Fully active, able to carry on all predisease activities without restriction)  1 - Symptomatic but completely ambulatory (Restricted in physically strenuous activity but ambulatory and able to carry out work of a light or sedentary nature. For example, light housework, office work)  2 - Symptomatic, <50% in bed during the day (Ambulatory and capable of all self care but unable to carry out any work activities. Up and about more than 50% of waking hours)  3 - Symptomatic, >50% in bed, but not bedbound (Capable of only limited self-care, confined to bed or chair 50% or more of waking hours)  4 - Bedbound (Completely disabled. Cannot carry on any self-care. Totally confined to bed or chair)  5 - Death   [name] MM, Creech RH, Tormey DC, et al. [phone]). "Toxicity and response criteria of the [facility]". Am. [name]. Oncol. 5 (6): 649-55  General: Well-developed, in no acute distress HEENT: Normocephalic, atraumatic; the patient is edentulous Cardiovascular: Regular rate and rhythm Respiratory: Clear to auscultation bilaterally GI: Soft, nontender, normal bowel sounds Extremities: No edema present Fiberoptic exam: After the use of topical anesthetic, a flexible scope was passed through the right near. Good visualization was obtained. No suspicious findings within the nasopharynx or oropharynx. A approximate 2 cm tumor was seen centered within the left puriform sinus. Extension along the aryepiglottic fold was present. No involvement of the vallecula. No extension to the contralateral side. The vocal cords moved symmetrically bilaterally.   LABORATORY DATA:  Lab Results  Component Value Date   WBC 4.5  09/13/2013   HGB 15.1 09/13/2013   HCT 44.6 09/13/2013   MCV 85.8 09/13/2013   PLT 205 09/13/2013   Lab Results  Component Value Date   NA 140 09/13/2013   K 4.9 09/13/2013   CL 99 09/13/2013   CO2 30 09/13/2013   Lab Results  Component Value Date   ALT 20 09/13/2013   AST 27 09/13/2013   ALKPHOS 47 09/13/2013   BILITOT 0.3 09/13/2013      RADIOGRAPHY: Dg Chest 2 View  09/13/2013   CLINICAL DATA:  Preop for laryngoscopy  EXAM: CHEST  2 VIEW  COMPARISON:  None.  FINDINGS: Cardiomediastinal silhouette is unremarkable. No acute infiltrate or pleural effusion. No pulmonary edema. Bony thorax is unremarkable.  IMPRESSION: No active cardiopulmonary disease.   Electronically Signed   By: Natasha Mead M.D.   On: 09/13/2013 13:10   Ct Soft Tissue Neck W Contrast  09/07/2013   CLINICAL DATA:  Esophageal cancer. Radiation therapy complete. Right-sided neck pain when swallowing. Dysphagia.  EXAM: CT NECK WITH CONTRAST  TECHNIQUE: Multidetector CT imaging of the neck was performed using the standard protocol following the bolus administration  of intravenous contrast.  CONTRAST:  173mL OMNIPAQUE IOHEXOL 300 MG/ML  SOLN  COMPARISON:  08/08/2010 PET-CT.  FINDINGS: Bulky mass suspicious for tumor centered at the left piriform sinus/ left aryepiglottic fold region with extension to the glottic level invading the paraglottic fat with bowing of tumor in an infraglottic position. Tumor narrows the air column and displaces structures to the right.  Scattered normal size lymph nodes with largest lymph node left level 2 region having short axis dimension of 7 mm.  Prominent atherosclerotic type changes most notable carotid bifurcation with 76% diameter stenosis proximal left internal carotid artery and 62% diameter stenosis proximal right internal carotid artery.  Cervical spondylotic changes with disc protrusion/ spur C3-4 through C5-6. No osseous destructive lesion.  Visualized intracranial and orbital structures without  evidence of metastatic disease.  Mucosal thickening right maxillary sinus.  Two 4 mm nodules within the right upper lung zone (series 4, images 39 and 45). Small metastatic lesions a consideration.  Post radiation therapy type changes medial aspect left superior mediastinum.  Patulous esophagus.  Evaluation limited.  IMPRESSION: Bulky mass suspicious for tumor centered at the left piriform sinus/ left aryepiglottic fold region with extension to the glottic level invading the paraglottic fat with bowing of tumor in an infraglottic position. Tumor narrows the air column and displaces structures to the right.  Scattered normal size lymph nodes with largest lymph node left level 2 region having short axis dimension of 7 mm.  Two 4 mm nodules within the right upper lung zone (series 4, images 39 and 45). Small metastatic lesions a consideration.  Prominent atherosclerotic type changes most notable carotid bifurcation with 76% diameter stenosis proximal left internal carotid artery and 62% diameter stenosis proximal right internal carotid artery.  Cervical spondylotic changes with disc protrusion/ spur C3-4 through C5-6.  These results will be called to the ordering clinician or representative by the Radiologist Assistant, and communication documented in the PACS Dashboard.   Electronically Signed   By: Chauncey Cruel M.D.   On: 09/07/2013 15:00   Mr Neck Soft Tissue Only W Wo Contrast  09/23/2013   CLINICAL DATA:  History of esophageal cancer 5 years ago. Pain left-side of neck extending to left the year. Pain with swelling. Pain for the past year.  EXAM: MR NECK SOFT TISSUE ONLY WITHOUT AND WITH CONTRAST  TECHNIQUE: Multiplanar, multisequence MR imaging was performed both before and after administration of intravenous contrast.  CONTRAST:  10 cc MultiHance.  COMPARISON:  Head CT 09/14/2013. Neck CT 09/07/2013. No comparison MR.  FINDINGS: 2.1 x 2.2 x 2.9 cm mass centered in the left piriform sinus with involvement of  the left aryepiglottic fold and extension to the left paraglottic region with invasion of left paraglottic fat planes and distortion of the left focal cord. Questionable invasion of posterior aspect of the lower aspect of the left thyroid cartilage versus reactive changes. Findings suspicious for primary malignancy.  No pathologically enlarged lymph nodes detected.  Heterogeneous bone marrow C3 through T1 with postradiation therapy type changes involving C2 and T2 through T4. The heterogeneous appearance of bone marrow C3 through T1 is of questionable etiology/ significance. Patient does not have underlying anemia as can sometimes be seen as cause of this appearance. Infiltration by tumor therefore not excluded however, no abnormal FDG uptake seen in this region on recent PET-CT.  Cervical spondylotic changes C3-4 thru C5-6.  Small vessel disease type changes of the pons.  CT detected pulmonary nodules not appreciated on the  MR secondary to motion.  Paranasal sinus mucosal thickening.  IMPRESSION: 2.1 x 2.2 x 2.9 cm mass centered in the left piriform sinus with involvement of the left aryepiglottic fold and extension to the left paraglottic region with invasion of left paraglottic fat planes and distortion of the left focal cord. Questionable invasion of posterior aspect of the lower aspect of the left thyroid cartilage versus reactive changes. Findings suspicious for primary malignancy.  No pathologically enlarged lymph nodes detected.  Heterogeneous bone marrow C3 through T1 with postradiation therapy type changes involving C2 and T2 through T4. The heterogeneous appearance of bone marrow C3 through T1 is of questionable etiology/ significance. Patient does not have underlying anemia as can sometimes be seen as cause of this appearance. Infiltration by tumor therefore not excluded however, no abnormal FDG uptake seen in this region on recent PET-CT.   Electronically Signed   By: Chauncey Cruel M.D.   On: 09/23/2013  11:56   Nm Pet Image Restag (ps) Skull Base To Thigh  09/14/2013   CLINICAL DATA:  Initial treatment strategy for head and neck cancer. Piriform sinuses carcinoma.  EXAM: NUCLEAR MEDICINE PET SKULL BASE TO THIGH  FASTING BLOOD GLUCOSE:  Value: 84mg /dl  TECHNIQUE: 19.1 mCi F-18 FDG was injected intravenously. CT data was obtained and used for attenuation correction and anatomic localization only. (This was not acquired as a diagnostic CT examination.) Additional exam technical data entered on technologist worksheet.  COMPARISON:  SP REMOVAL TUN ACCESS W/ PORT W/O FL dated 08/20/2010; CT NECK W/CM dated 09/07/2013; NM PET IMAGE RESTAG (PS) SKULL BASE TO THIGH dated 08/08/2010  FINDINGS: NECK  There is a hypermetabolic mass centered within the left piriform sinus with SUV max 25.3. This mass is better delineated on the comparison diagnostic contrast CT of the neck but measures approximately 23 x 23 mm (image number 48) just superior to the glottis and medial to the thyroid cartilage). There are no hypermetabolic lymph nodes in the the cervical chain. No posterior triangle hypermetabolic nodes. No supraclavicular hypermetabolic lymph nodes  CHEST  No hypermetabolic mediastinal lymph nodes. No suspicious pulmonary nodules. 5 mm right upper lobe pulmonary nodule (image 75) does not have associated metabolic activity. 4 mm right lower lobe pulmonary nodule without metabolic activity. (Image 101).  ABDOMEN/PELVIS  No abnormal hypermetabolic activity within the liver, pancreas, adrenal glands, or spleen. No hypermetabolic lymph nodes in the abdomen or pelvis. No abnormal activity through the esophagus.  SKELETON  No focal hypermetabolic activity to suggest skeletal metastasis.  IMPRESSION: 1. Hypermetabolic mass centered in the left piriform sinus consists with primary head neck cancer. 2. No evidence of nodal metastasis in the neck. 3. Small pulmonary nodules are non specific. Recommend attention on follow-up. 4. No  abnormal activity associated with the esophagus.   Electronically Signed   By: Suzy Bouchard M.D.   On: 09/14/2013 16:43   Dg Ugi W/high Density W/kub  09/07/2013   CLINICAL DATA:  63 year old male with history of esophageal cancer status post radiation therapy. Dysphagia. Pain in the left cervical region radiating into the left jaw during swallowing.  EXAM: UPPER GI SERIES W/HIGH DENSITY W/KUB  TECHNIQUE: After obtaining a scout radiograph a routine upper GI series was performed using thin and high density barium.  COMPARISON:  04/25/2009.  FLUOROSCOPY TIME:  6 min and 59 seconds  FINDINGS: Rapid sequence imaging of the patient while swallowing demonstrate an eccentric mass in the left side of the hypopharynx best demonstrated on image 5  of series 28. This is concerning for potential pharyngeal or laryngeal neoplasm, and correlation with contemporaneously CT scan is recommended.  Double contrast images of the esophagus demonstrated normal appearance of the esophageal mucosa. Previously noted irregularity of the mid esophagus is no longer identified. No definite esophageal mass, stricture or ring. A barium tablet was administered, which passed readily into the stomach. No hiatal hernia. Multiple single swallow attempts were observed, and there was failure to propagate the primary peristaltic wave on 2 out of 5 observed attempts. No tertiary contractions were noted. Esophagus remained patulous throughout the procedure.  Double contrast images of the stomach demonstrate no evidence of gastric mass or large ulceration. The gastric mucosa was normal in appearance. Duodenal bulb is normal in appearance. Water siphon test demonstrated no evidence of significant gastroesophageal reflux.  IMPRESSION: 1. Asymmetric pharyngeal or laryngeal mass in the left side of the hypopharynx immediately above the level of the vocal cords. This is highly concerning for neoplasm, and correlation with contemporaneous CT of the neck is  recommended. 2. Resolution of previously noted irregularity in the mid esophagus, compatible with treated esophageal neoplasm. 3. Patulous esophagus with mild nonspecific esophageal motility disorder. 4. Normal appearance of the stomach. These results will be called to the ordering clinician or representative by the Radiologist Assistant, and communication documented in the PACS Dashboard.   Electronically Signed   By: Vinnie Langton M.D.   On: 09/07/2013 10:21       IMPRESSION: The patient has a history of squamous cell carcinoma of the esophagus status post definitive chemoradiotherapy  From 2010.he has done 3 well with this. He has a new diagnosis of squamous cell carcinoma of the  Pyriform simus. I I would stage this as a  T2N0M0 tumor, with this possibly representing a T3 tumor. Young his case and the alterations in treatment field Young his prior radiation, I favor treating him with definitive chemoradiation.  I have discussed with the patient a potential 6-1/2 week course of radiation treatment with concurrent chemotherapy. I discussed with him the rationale of this treatment. He had a detailed discussion regarding the possible side effects and risks of treatment as well. All of his questions were answered.    we discussed related issues including referrals to speech therapy, interventional radiology for placement of a feeding tube, and also dentistry.   PLAN: The patient will proceed with the above scheduled appointments and he will be scheduled for a simulation in the near future such that we can proceed with treatment planning.     I spent 60 minutes face to face with the patient and more than 50% of that time was spent in counseling and/or coordination of care.    ________________________________   Jodelle Gross, MD, PhD

## 2013-09-28 NOTE — H&P (Signed)
Collin Young is an 63 y.o. male.   Chief Complaint: "I'm getting another port a cath" HPI: Patient with past history of esophageal carcinoma (completed treatment), now with left piriform sinus squamous cell carcinoma presents today for port a cath placement for chemotherapy.  Past Medical History  Diagnosis Date  . Hypertension   . Hyperlipemia   . HOH (hard of hearing)   . Seizures     alcohol withdrawls- 2001  . GERD (gastroesophageal reflux disease)     takes zantac prn  . Cancer     Esophagus- radiation 2011    Past Surgical History  Procedure Laterality Date  . No past surgeries    . Cardiovascular stress test  10/09/09    normal nuclearr stress test, EF 57% [name])  . Laryngoscopy N/A 09/15/2013    Procedure: LARYNGOSCOPY;  Surgeon: [name], MD;  Location: [facility];  Service: ENT;  Laterality: N/A;  direct laryngoscopy with biopsy and esophagoscopy    No family history on file. Social History:  reports that he has quit smoking. He does not have any smokeless tobacco history on file. He reports that he does not drink alcohol or use illicit drugs.  Allergies: No Known Allergies  Current outpatient prescriptions:amLODipine-benazepril (LOTREL) 5-10 MG per capsule, Take 1 capsule by mouth every morning., Disp: , Rfl: ;  atorvastatin (LIPITOR) 10 MG tablet, Take 10 mg by mouth every evening. , Disp: , Rfl:  Current facility-administered medications:0.9 %  sodium chloride infusion, , Intravenous, Continuous, Ascencion Dike, PA-C;  ceFAZolin (ANCEF) IVPB 2 g/50 mL premix, 2 g, Intravenous, Once, Ascencion Dike, PA-C  09/28/13 labs pending Review of Systems  Constitutional: Negative for fever and chills.  Respiratory: Negative for cough and shortness of breath.   Cardiovascular: Negative for chest pain.  Gastrointestinal: Negative for nausea, vomiting and abdominal pain.  Musculoskeletal: Negative for back pain.  Neurological: Negative for headaches.  Endo/Heme/Allergies:  Does not bruise/bleed easily.    Blood pressure 144/79, pulse 72, temperature 97.1 F (36.2 C), temperature source Oral, resp. rate 18, height 5\' 6"  (1.676 m), weight 145 lb (65.772 kg), SpO2 98.00%. Physical Exam  Constitutional: He is oriented to person, place, and time. He appears well-developed and well-nourished.  Cardiovascular: Normal rate and regular rhythm.   Respiratory: Effort normal and breath sounds normal.  Clean scar from prior rt upper chest wall port a cath  GI: Soft. Bowel sounds are normal.  Musculoskeletal: Normal range of motion. He exhibits no edema.  Neurological: He is alert and oriented to person, place, and time.     Assessment/Plan Patient with past history of esophageal carcinoma, now with left piriform sinus squamous cell carcinoma presents today for port a cath placement for chemotherapy. Details/risks of procedure d/w pt/wife with their understanding and consent.   ALLRED,D KEVIN 09/28/2013, 12:45 PM

## 2013-09-28 NOTE — Progress Notes (Signed)
Please see the Nurse Progress Note in the MD Initial Consult Encounter for this patient. 

## 2013-09-28 NOTE — Discharge Instructions (Signed)

## 2013-09-29 ENCOUNTER — Encounter (HOSPITAL_COMMUNITY): Payer: Self-pay | Admitting: Pharmacy Technician

## 2013-09-29 ENCOUNTER — Telehealth: Payer: Self-pay | Admitting: *Deleted

## 2013-09-29 NOTE — Telephone Encounter (Signed)
Called patient to inform of appt. With Dr. Enrique Sack and appt. With Dr. Garald Balding & appt. For peg tube placement, spoke with patient and he is aware of these appts.

## 2013-10-02 ENCOUNTER — Ambulatory Visit (HOSPITAL_COMMUNITY): Payer: Medicaid - Dental | Admitting: Dentistry

## 2013-10-02 ENCOUNTER — Other Ambulatory Visit: Payer: Self-pay | Admitting: Radiology

## 2013-10-02 ENCOUNTER — Ambulatory Visit: Payer: Self-pay

## 2013-10-02 ENCOUNTER — Ambulatory Visit: Payer: Medicaid Other | Attending: Radiation Oncology

## 2013-10-02 ENCOUNTER — Encounter (HOSPITAL_COMMUNITY): Payer: Self-pay | Admitting: Dentistry

## 2013-10-02 VITALS — BP 121/77 | HR 76 | Temp 98.3°F

## 2013-10-02 DIAGNOSIS — Z972 Presence of dental prosthetic device (complete) (partial): Secondary | ICD-10-CM

## 2013-10-02 DIAGNOSIS — IMO0001 Reserved for inherently not codable concepts without codable children: Secondary | ICD-10-CM | POA: Insufficient documentation

## 2013-10-02 DIAGNOSIS — Z0189 Encounter for other specified special examinations: Secondary | ICD-10-CM

## 2013-10-02 DIAGNOSIS — K0820 Unspecified atrophy of edentulous alveolar ridge: Secondary | ICD-10-CM

## 2013-10-02 DIAGNOSIS — Z463 Encounter for fitting and adjustment of dental prosthetic device: Secondary | ICD-10-CM

## 2013-10-02 DIAGNOSIS — K08109 Complete loss of teeth, unspecified cause, unspecified class: Secondary | ICD-10-CM

## 2013-10-02 DIAGNOSIS — C12 Malignant neoplasm of pyriform sinus: Secondary | ICD-10-CM

## 2013-10-02 DIAGNOSIS — K000 Anodontia: Secondary | ICD-10-CM

## 2013-10-02 DIAGNOSIS — R1310 Dysphagia, unspecified: Secondary | ICD-10-CM | POA: Insufficient documentation

## 2013-10-02 NOTE — Patient Instructions (Signed)

## 2013-10-02 NOTE — Progress Notes (Signed)
DENTAL CONSULTATION  Date of Consultation:  10/02/2013 Patient Name:   Collin Young Date of Birth:   [birth year] Medical Record Number: [id number]  VITALS: BP 121/77  Pulse 76  Temp(Src) 98.3 F (36.8 C) (Oral)   CHIEF COMPLAINT: The patient was referred for a pre-chemoradiation therapy dental consultation examination.  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 Patient with anticipated chemoradiation therapy. The patient is now seen as part of a medically necessary pre-chemoradiation therapy dental protocol examination.  The patient currently is edentulous. Patient denies having any problems with his dentures. Dentures were made" a long time ago". This is at least 15-20 years ago. Patient indicates the upper and lower dentures were made by " [employer]".  Patient denies having problems with dentures. Patient indicates " I'm still eating good". Patient has had only one set of dentures fabricated.     PROBLEM LIST: Patient Active Problem List   Diagnosis Date Noted  . Malignant neoplasm of pyriform sinus 09/28/2013  . 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 NONSPECIFIC ABN FINDING RAD & OTH EXAM GI TRACT 05/14/2009    PMH: Past Medical History  Diagnosis Date  . Hypertension   . Hyperlipemia   . HOH (hard of hearing)   . Seizures     alcohol withdrawls- 2001  . GERD (gastroesophageal reflux disease)     takes zantac prn  . Cancer     Esophagus- radiation 2011    PSH: Past Surgical History  Procedure Laterality Date  . No past surgeries    . Cardiovascular stress test  10/09/09    normal nuclearr stress test, EF 57% [name])  . Laryngoscopy N/A 09/15/2013    Procedure: LARYNGOSCOPY;  Surgeon: [name], MD;  [facility];  Service: ENT;  Laterality: N/A;  direct laryngoscopy with biopsy and esophagoscopy    ALLERGIES: No Known Allergies  MEDICATIONS: Current Outpatient  Prescriptions  Medication Sig Dispense Refill  . amLODipine-benazepril (LOTREL) 5-10 MG per capsule Take 1 capsule by mouth every morning.      Marland Kitchen atorvastatin (LIPITOR) 10 MG tablet Take 10 mg by mouth every evening.        No current facility-administered medications for this visit.    LABS: Lab Results  Component Value Date   WBC 3.7* 09/28/2013   HGB 14.3 09/28/2013   HCT 42.2 09/28/2013   MCV 85.6 09/28/2013   PLT 219 09/28/2013      Component Value Date/Time   NA 140 09/13/2013 1149   NA 139 07/03/2009 1044   K 4.9 09/13/2013 1149   K 4.6 07/03/2009 1044   CL 99 09/13/2013 1149   CL 97* 07/03/2009 1044   CO2 30 09/13/2013 1149   CO2 31 07/03/2009 1044   GLUCOSE 82 09/13/2013 1149   GLUCOSE 88 07/03/2009 1044   BUN 18 09/13/2013 1149   BUN 17 07/03/2009 1044   CREATININE 1.14 09/13/2013 1149   CREATININE 0.9 07/03/2009 1044   CALCIUM 9.8 09/13/2013 1149   CALCIUM 9.6 07/03/2009 1044   GFRNONAA 67* 09/13/2013 1149   GFRAA 78* 09/13/2013 1149   Lab Results  Component Value Date   INR 0.93 09/28/2013   No results found for this basename: PTT    SOCIAL HISTORY: History   Social History  . Marital Status: Legally Separated    Spouse Name: N/A    Number of Children: N/A  . Years of Education: N/A  Occupational History  . Not on file.   Social History Main Topics  . Smoking status: Former Smoker -- 1.00 packs/day for 40 years    Types: Cigarettes    Quit date: 09/01/1999  . Smokeless tobacco: Former Systems developer    Types: [place] date: 09/01/1999     Comment: quit in 2011  . Alcohol Use: No     Comment: quit in 2001  . Drug Use: No     Comment: back in the day used cocaine,alcohol, marijuana  . Sexual Activity: Not on file   Other Topics Concern  . Not on file   Social History Narrative  . No narrative on file    FAMILY HISTORY: No family history on file.   REVIEW OF SYSTEMS: Reviewed with patient and included in dental record.   DENTAL HISTORY: CHIEF  COMPLAINT: The patient was referred for a pre-chemoradiation therapy dental consultation examination.  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 Patient with anticipated chemoradiation therapy. The patient is now seen as part of a medically necessary pre-chemoradiation therapy dental protocol examination.  The patient currently is edentulous. Patient denies having any problems with his dentures. Dentures were made" a long time ago". This is at least 15-20 years ago. Patient indicates the upper and lower dentures were made by " [employer]".  Patient denies having problems with dentures. Patient indicates " I'm still eating good". Patient has had only one set of dentures fabricated.    DENTAL EXAMINATION:  GENERAL:The patient is a well-developed, well-nourished male in no acute distress.  HEAD AND NECK: I am am unable to palpate any right or left neck lymphadenopathy. The patient denies acute TMJ symptoms.  INTRAORAL EXAM: The patient has normal saliva. There is no evidence of denture irritation. The patient has flabby tissues associated with the maxillary premaxilla. Patient also has mandibular alveolar ridge atrophy.  DENTITION: Patient is edentulous.  PROSTHODONTIC: The patient has an upper and lower complete denture. These were fabricated approximately 15-20 years ago. There is less than ideal retention and stability of the upper lower complete dentures. The dentures, however, are held in acceptable he by the patient with denture adhesive.  The patient did not take the dentures out at bedtime. Patient was instructed to do this on a nightly basis.  Patient is to consider fabrication of new upper and lower complete dentures approximately 3 months after the last radiation therapy has been completed.  OCCLUSION: The occlusion of the upper lower complete dentures as acceptable. There is a worn denture  occlusion.  RADIOGRAPHIC INTERPRETATION: An orthopantogram was taken today. And supplemented with 3 lower periapical radiographs. The patient is edentulous. There is atrophy of edentulous alveolar ridges. There is a radiopacity in the area of tooth #19 and 20 that may represent condensing osteitis. This did not appear to be a retained root segment.  ASSESSMENTS: 1. The patient is edentulous 2. There is atrophy of edentulous alveolar ridge. 3. The patient has flabby tissues is associated with the premaxilla and lower mandibular alveolar ridge 4.  The patient has an upper and lower complete denture that are less than ideal with regard to retention and stability but is acceptable by patient report. 5.  There is a radiopacity in the by the mandible in the area of tooth numbers 19 and 20 consistent with previous condensing osteitis. This did not appear to represent a retained root segment.  PLAN/RECOMMENDATIONS: 1. I discussed the risks, benefits, and complications of various treatment options with the patient in relationship to his medical and dental conditions, anticipated chemoradiation therapy, chemoradiation therapy side effects to include xerostomia, mucositis, taste changes, gum and jawbone changes, and risk for infection and osteoradionecrosis.  We discussed various treatment options to include no treatment, pre-prosthetic surgery as indicated, implant therapy, and replacement of missing teeth with new dentures as indicated. The patient currently wishes to  Defer all dental treatment at this time and will consider fabrication of new upper lower complete dentures approximately 3 months after the last radiation therapy has been completed. The patient was reminded to keep dentures out at bedtime. The patient was provided trismus device with trismus exercise instructions.  2. Discussion of findings with medical team and coordination of future medical and dental care as needed. The patient is  currently cleared for chemoradiation therapy at this time.  I spent 45 minutes face to face with patient and more than 50% of time was spent in counseling and /or coordination of care.   Lenn Cal, DDS

## 2013-10-03 ENCOUNTER — Encounter (HOSPITAL_COMMUNITY): Payer: Self-pay

## 2013-10-03 ENCOUNTER — Ambulatory Visit (HOSPITAL_COMMUNITY)
Admission: RE | Admit: 2013-10-03 | Discharge: 2013-10-03 | Disposition: A | Discharge status: Home / Self Care | Payer: Medicaid Other | Source: Ambulatory Visit | Attending: *Deleted | Admitting: *Deleted

## 2013-10-03 ENCOUNTER — Other Ambulatory Visit: Payer: Self-pay | Admitting: Radiation Oncology

## 2013-10-03 ENCOUNTER — Ambulatory Visit (HOSPITAL_COMMUNITY)
Admission: RE | Admit: 2013-10-03 | Discharge: 2013-10-03 | Disposition: A | Discharge status: Home / Self Care | Payer: Medicaid Other | Source: Ambulatory Visit | Attending: Radiation Oncology | Admitting: Radiation Oncology

## 2013-10-03 DIAGNOSIS — E785 Hyperlipidemia, unspecified: Secondary | ICD-10-CM | POA: Insufficient documentation

## 2013-10-03 DIAGNOSIS — Z923 Personal history of irradiation: Secondary | ICD-10-CM | POA: Insufficient documentation

## 2013-10-03 DIAGNOSIS — Z87891 Personal history of nicotine dependence: Secondary | ICD-10-CM | POA: Insufficient documentation

## 2013-10-03 DIAGNOSIS — C12 Malignant neoplasm of pyriform sinus: Secondary | ICD-10-CM

## 2013-10-03 DIAGNOSIS — K219 Gastro-esophageal reflux disease without esophagitis: Secondary | ICD-10-CM | POA: Insufficient documentation

## 2013-10-03 DIAGNOSIS — I10 Essential (primary) hypertension: Secondary | ICD-10-CM | POA: Insufficient documentation

## 2013-10-03 LAB — CBC
HEMATOCRIT: 41 % (ref 39.0–52.0)
Hemoglobin: 13.9 g/dL (ref 13.0–17.0)
MCH: 29 pg (ref 26.0–34.0)
MCHC: 33.9 g/dL (ref 30.0–36.0)
MCV: 85.6 fL (ref 78.0–100.0)
Platelets: 184 10*3/uL (ref 150–400)
RBC: 4.79 MIL/uL (ref 4.22–5.81)
RDW: 13.7 % (ref 11.5–15.5)
WBC: 3 10*3/uL — ABNORMAL LOW (ref 4.0–10.5)

## 2013-10-03 LAB — BASIC METABOLIC PANEL
BUN: 12 mg/dL (ref 6–23)
CO2: 28 mEq/L (ref 19–32)
Calcium: 9 mg/dL (ref 8.4–10.5)
Chloride: 98 mEq/L (ref 96–112)
Creatinine, Ser: 1.04 mg/dL (ref 0.50–1.35)
GFR, EST AFRICAN AMERICAN: 87 mL/min — AB (ref >90)
GFR, EST NON AFRICAN AMERICAN: 75 mL/min — AB (ref >90)
Glucose, Bld: 96 mg/dL (ref 70–99)
Potassium: 4 mEq/L (ref 3.7–5.3)
Sodium: 136 mEq/L — ABNORMAL LOW (ref 137–147)

## 2013-10-03 LAB — APTT: aPTT: 28 seconds (ref 24–37)

## 2013-10-03 LAB — PROTIME-INR
INR: 0.93 (ref 0.00–1.49)
PROTHROMBIN TIME: 12.3 s (ref 11.6–15.2)

## 2013-10-03 MED ORDER — IOHEXOL 300 MG/ML  SOLN: 10.0000 mL | Freq: Once | INTRAMUSCULAR | Status: DC | PRN

## 2013-10-03 MED ORDER — GLUCAGON HCL (RDNA) 1 MG IJ SOLR
INTRAMUSCULAR | Status: AC
  Administered 2013-10-03: 0.5 mg
  Filled 2013-10-03: qty 1

## 2013-10-03 MED ORDER — FENTANYL CITRATE 0.05 MG/ML IJ SOLN
INTRAMUSCULAR | Status: AC | PRN
  Administered 2013-10-03 (×2): 50 ug via INTRAVENOUS

## 2013-10-03 MED ORDER — SODIUM CHLORIDE 0.9 % IJ SOLN
10.0000 mL | Freq: Once | INTRAMUSCULAR | Status: AC
  Administered 2013-10-03: 10 mL via INTRAVENOUS

## 2013-10-03 MED ORDER — HEPARIN SOD (PORK) LOCK FLUSH 100 UNIT/ML IV SOLN
500.0000 [IU] | INTRAVENOUS | Status: AC | PRN
  Administered 2013-10-03: 500 [IU]
  Filled 2013-10-03: qty 5

## 2013-10-03 MED ORDER — SODIUM CHLORIDE 0.9 % IV SOLN
INTRAVENOUS | Status: DC
  Administered 2013-10-03: 08:00:00 via INTRAVENOUS

## 2013-10-03 MED ORDER — HYDROCODONE-ACETAMINOPHEN 5-325 MG PO TABS: 1.0000 | ORAL_TABLET | ORAL | Status: DC | PRN

## 2013-10-03 MED ORDER — CEFAZOLIN SODIUM-DEXTROSE 2-3 GM-% IV SOLR
2.0000 g | INTRAVENOUS | Status: AC
  Administered 2013-10-03: 2 g via INTRAVENOUS
  Filled 2013-10-03: qty 50

## 2013-10-03 MED ORDER — MIDAZOLAM HCL 2 MG/2ML IJ SOLN
INTRAMUSCULAR | Status: AC
  Filled 2013-10-03: qty 4

## 2013-10-03 MED ORDER — ONDANSETRON HCL 4 MG/2ML IJ SOLN
4.0000 mg | INTRAMUSCULAR | Status: DC | PRN
  Administered 2013-10-03: 4 mg via INTRAVENOUS
  Filled 2013-10-03: qty 2

## 2013-10-03 MED ORDER — FENTANYL CITRATE 0.05 MG/ML IJ SOLN
INTRAMUSCULAR | Status: AC
  Filled 2013-10-03: qty 4

## 2013-10-03 MED ORDER — MIDAZOLAM HCL 2 MG/2ML IJ SOLN
INTRAMUSCULAR | Status: AC | PRN
  Administered 2013-10-03 (×2): 1 mg via INTRAVENOUS

## 2013-10-03 MED ORDER — LIDOCAINE HCL 1 % IJ SOLN
INTRAMUSCULAR | Status: AC
  Filled 2013-10-03: qty 20

## 2013-10-03 NOTE — H&P (Signed)
Chief Complaint: "I'm here for a feeding tube" Referring Physician:Moody HPI: Collin Young is an 63 y.o. male with past history of esophageal carcinoma (completed treatment), now with left piriform sinus squamous cell carcinoma. He underwent port placement last week and has done well with that. No issues with port. He will eventually get radiation therapy and is now here for gastrostomy tube placement for the possibility he may develop dysphagia/mucositis. He drank the barium as instructed last pm and is otherwise otherwise eating regular/solid foods diet. Denies new c/o. PMHx, meds reviewed.   Past Medical History:  Past Medical History  Diagnosis Date  . Hypertension   . Hyperlipemia   . HOH (hard of hearing)   . Seizures     alcohol withdrawls- 2001  . GERD (gastroesophageal reflux disease)     takes zantac prn  . Cancer     Esophagus- radiation 2011    Past Surgical History:  Past Surgical History  Procedure Laterality Date  . No past surgeries    . Cardiovascular stress test  10/09/09    normal nuclearr stress test, EF 57% [name])  . Laryngoscopy N/A 09/15/2013    Procedure: LARYNGOSCOPY;  Surgeon: [name], MD;  Location: [facility] OR;  Service: ENT;  Laterality: N/A;  direct laryngoscopy with biopsy and esophagoscopy    Family History: History reviewed. No pertinent family history.  Social History:  reports that he quit smoking about 14 years ago. His smoking use included Cigarettes. He has a 40 pack-year smoking history. He quit smokeless tobacco use about 14 years ago. His smokeless tobacco use included Chew. He reports that he does not drink alcohol or use illicit drugs.  Allergies: No Known Allergies  Medications:   Medication List    ASK your doctor about these medications       amLODipine-benazepril 5-10 MG per capsule  Commonly known as:  LOTREL  Take 1 capsule by mouth every morning.     atorvastatin 10 MG tablet  Commonly known as:  LIPITOR  Take 10  mg by mouth every evening.        Please HPI for pertinent positives, otherwise complete 10 system ROS negative.  Physical Exam: BP 133/66  Pulse 72  Temp(Src) 97.8 F (36.6 C)  Resp 18  Ht 5\' 6"  (1.676 m)  Wt 145 lb (65.772 kg)  BMI 23.41 kg/m2  SpO2 97% Body mass index is 23.41 kg/(m^2).   General Appearance:  Alert, cooperative, no distress, appears stated age  Head:  Normocephalic, without obvious abnormality, atraumatic  ENT: Unremarkable  Neck: Supple, symmetrical, trachea midline  Lungs:   Clear to auscultation bilaterally, no w/r/r, respirations unlabored without use of accessory muscles.  Chest Wall:  No tenderness or deformity. (R)port site accessed, no swelling, erythema  Heart:  Regular rate and rhythm, S1, S2 normal, no murmur, rub or gallop.  Abdomen:   Soft, non-tender, non distended.  Extremities: Extremities normal, atraumatic, no cyanosis or edema  Pulses: 2+ and symmetric  Neurologic: Normal affect, no gross deficits.   Results for orders placed during the hospital encounter of 10/03/13 (from the past 48 hour(s))  APTT     Status: None   Collection Time    10/03/13  7:55 AM      Result Value Range   aPTT 28  24 - 37 seconds  CBC     Status: Abnormal   Collection Time    10/03/13  7:55 AM      Result Value Range  WBC 3.0 (*) 4.0 - 10.5 K/uL   RBC 4.79  4.22 - 5.81 MIL/uL   Hemoglobin 13.9  13.0 - 17.0 g/dL   HCT 41.0  39.0 - 52.0 %   MCV 85.6  78.0 - 100.0 fL   MCH 29.0  26.0 - 34.0 pg   MCHC 33.9  30.0 - 36.0 g/dL   RDW 13.7  11.5 - 15.5 %   Platelets 184  150 - 400 K/uL  PROTIME-INR     Status: None   Collection Time    10/03/13  7:55 AM      Result Value Range   Prothrombin Time 12.3  11.6 - 15.2 seconds   INR 0.93  0.00 - 1.49   No results found.  Assessment/Plan Left piriform sinus squamous cell carcinoma Upcoming radiation therapy at risk for dysphagia/mucositis Discussed G-tube placement Explained procedure, risks,  complications, use of sedation. Labs reviewed. Consent signed in chart  Ascencion Dike PA-C 10/03/2013, 8:25 AM

## 2013-10-03 NOTE — Progress Notes (Signed)
Post G-tube placement recovery in short stay: Pt states nausea has resolved and pain is 2/10 abdomen. Ambulated in room with no assist and is eager to go home.Dr Anselm Pancoast came by to check on patient. Pt ready for discharge

## 2013-10-03 NOTE — Discharge Instructions (Signed)
Gastrostomy Tube Home Guide A gastrostomy tube is a tube that is surgically placed into the stomach. It is also called a "G-tube." G-tubes are used when a person is unable to eat and drink enough on their own to stay healthy. The tube is inserted into the stomach through a small cut (incision) in the skin. This tube is used for:  Feeding.  Giving medication. GASTROSTOMY TUBE CARE  Wash your hands with soap and water. Remove the old dressing beginning tomorrow. May shower. Do not immerse in tub.. Some styles of G-tubes may need a dressing inserted between the skin and the G-tube. Wash site daily with soap and water. Apply triple antibiotic ointment to site , redress daily X 7 daysCheck the area where the tube enters the skin (insertion site) for redness, swelling, or pus-like (purulent) drainage. A small amount of clear or tan liquid drainage is normal. Check to make sure scar tissue (skin) is not growing around the insertion site. This could have a raised, bumpy appearance.  A cotton swab can be used to clean the skin around the tube:  When the G-tube is first put in, a normal saline solution or water can be used to clean the skin.  Roll the cotton swab around the G-tube insertion site to remove any drainage or crusting at the insertion site. STOMACH RESIDUALS Feeding tube residuals are the amount of liquids that are in the stomach at any given time. Residuals may be checked before giving feedings, medications, or as instructed by your health care provider.  Ask your health care provider if there are instances when you would not start tube feedings depending on the amount or type of contents withdrawn from the stomach.  Check residuals by attaching a syringe to the G-tube and pull back on the syringe plunger. Note the amount and return the residual back into the stomach. FLUSHING THE G-TUBE  The G-tube should be periodically flushed with clean warm water to keep it from clogging.  Flush the  G-tube after feedings or medications. Draw up 30 mL of warm water in a syringe. Connect the syringe to the G-tube and slowly push the water into the tube.  Do not push feedings, medications, or flushes rapidly. Flush the G-tube gently and slowly.  Only use syringes made for G-tubes to flush medications or feedings.  Your health care provider may want the G-tube flushed more often or with more water. If this is the case, follow your health care provider's        instructons   Do not use scissors near tube   FEEDINGS Your health care provider will determine whether feedings are given as a bolus (a certain amount given at one time and at scheduled times) or whether feedings will be given continuously on a feeding pump.   Formulas should be given at room temperature.  If feedings are continuous, no more than 4 hours worth of feedings should be placed in the feeding bag. This helps prevent spoilage or accidental excess infusion.  Cover and place unused formula in the refrigerator.  If feedings are continuous, stop the feedings when medications or flushes are given. Be sure to restart the feedings.  Feeding bags and syringes should be replaced as instructed by your health care provider. GIVING MEDICATION   In general, it is best if all medications are in a liquid form for G-tube administration. Liquid medications are less likely to clog the G-tube.  Mix the liquid medication with 30 mL (or amount  recommended by your health care provider) of warm water.  Draw up the medication into the syringe.  Attach the syringe to the G-tube and slowly push the mixture into the G-tube.  After giving the medication, draw up 30 mL of warm water in the syringe and slowly flush the G-tube.  For pills or capsules, check with your health care provider first before crushing medications. Some pills are not effective if they are crushed. Some capsules are sustained release medications.  If appropriate, crush  the pill or capsule and mix with 30 mL of warm water. Using the syringe, slowly push the medication through the tube, then flush the tube with another 30 mL of tap water. G-TUBE PROBLEMS G-tube was pulled out.  Cause: May have been pulled out accidentally.  Solutions: Cover the opening with clean dressing and tape. Call your health care provider right away. The G-tube should be put in as soon as possible (within 4 hours) so the G-tube opening (tract) does not close. The G-tube needs to be put in at a healthcare setting. An X-ray needs to be done to confirm placement before the G-tube can be used again. Redness, irritation, soreness, or foul odor around the gastrostomy site.  Cause: May be caused by leakage or infection.  Solutions: Call your health care provider right away. Large amount of leakage of fluid or mucus-like liquid present (a large amount means it soaks clothing).  Cause: Many reasons could cause the G-tube to leak.  Solutions: Call your health care provider to discuss the amount of leakage. Skin or scar tissue appears to be growing where tube enters skin.   Cause: Tissue growth may develop around the insertion site if the G-tube is moved or pulled on excessively.  Solutions: Secure tube with tape so that excess movement does not occur. Call your health care provider. G-tube is clogged.  Cause: Thick formula or medication.  Solutions: Try to slowly push warm water into the tube with a large syringe. Never try to push any object into the tube to unclog it. Do not force fluid into the G-tube. If you are unable to unclog the tube, call your health care provider right away. TIPS  Head of Bed [facility]) position refers to the upright position of a person's upper body.  When giving medications or a feeding bolus, keep the [facility] up as told by your health care provider. Do this during the feeding and for 1 hour after the feeding or medication administration.  If continuous feedings are  being given, it is best to keep the [facility] up as told by your health care provider. When ADLs (Activities of Daily Living) are performed and the [facility] needs to be flat, be sure to turn the feeding pump off. Restart the feeding pump when the [facility] is returned to the recommended height.  Do not pull or put tension on the tube.  To prevent fluid backflow, kink the G-tube before removing the cap or disconnecting a syringe.  Check the G-tube length every day. Measure from the insertion site to the end of the G-tube. If the length is longer than previous measurements, the tube may be coming out. Call your health care provider if you notice increasing G-tube length.  Oral care, such as brushing teeth, must be continued.  You may need to remove excess air (vent) from the G-tube. Your health care provider will tell you if this is needed.  Always call your health care provider if you have questions or problems  with the G-tube. SEEK IMMEDIATE MEDICAL CARE IF:   You have severe abdominal pain, tenderness, or abdominal bloating (distension).  You have nausea or vomiting.  You are constipated or have problems moving your bowels.  The G-tube insertion site is red, swollen, has a foul smell, or has yellow or brown drainage.  You have difficulty breathing or shortness of breath.  You have a fever.  You have a large amount of feeding tube residuals.  The G-tube is clogged and cannot be flushed. MAKE SURE YOU:   Understand these instructions.  Will watch your condition.  Will get help right away if you are not doing well or get worse. Document Released: 10/26/2001 Document Revised: 06/07/2013 Document Reviewed: 04/24/2013 [facility] Patient Information 2014 [place], [place]. Conscious Sedation, Adult, Care After Refer to this sheet in the next few weeks. These instructions provide you with information on caring for yourself after your procedure. Your health care provider may also give you more specific  instructions. Your treatment has been planned according to current medical practices, but problems sometimes occur. Call your health care provider if you have any problems or questions after your procedure. WHAT TO EXPECT AFTER THE PROCEDURE  After your procedure:  You may feel sleepy, clumsy, and have poor balance for several hours.  Vomiting may occur if you eat too soon after the procedure. HOME CARE INSTRUCTIONS  Do not participate in any activities where you could become injured for at least 24 hours. Do not:  Drive.  Swim.  Ride a bicycle.  Operate heavy machinery.  Cook.  Use power tools.  Climb ladders.  Work from a high place.  Do not make important decisions or sign legal documents until you are improved.  If you vomit, drink water, juice, or soup when you can drink without vomiting. Make sure you have little or no nausea before eating solid foods.  Only take over-the-counter or prescription medicines for pain, discomfort, or fever as directed by your health care provider.  Make sure you and your family fully understand everything about the medicines given to you, including what side effects may occur.  You should not drink alcohol, take sleeping pills, or take medicines that cause drowsiness for at least 24 hours.  If you smoke, do not smoke without supervision.  If you are feeling better, you may resume normal activities 24 hours after you were sedated.  Keep all appointments with your health care provider. SEEK MEDICAL CARE IF:  Your skin is pale or bluish in color.  You continue to feel nauseous or vomit.  Your pain is getting worse and is not helped by medicine.  You have bleeding or swelling.  You are still sleepy or feeling clumsy after 24 hours. SEEK IMMEDIATE MEDICAL CARE IF:  You develop a rash.  You have difficulty breathing.  You develop any type of allergic problem.  You have a fever. MAKE SURE YOU:  Understand these  instructions.  Will watch your condition.  Will get help right away if you are not doing well or get worse. Document Released: 06/07/2013 Document Reviewed: 03/24/2013 [facility] Patient Information 2014 [place], [place].

## 2013-10-03 NOTE — Procedures (Signed)
Placement of gastrostomy tube.  No immediate complication.

## 2013-10-03 NOTE — Progress Notes (Signed)
C/o nausea. No vomiting. Zofran given

## 2013-10-04 ENCOUNTER — Other Ambulatory Visit: Payer: Self-pay | Admitting: Hematology & Oncology

## 2013-10-05 ENCOUNTER — Telehealth: Payer: Self-pay | Admitting: Hematology & Oncology

## 2013-10-05 ENCOUNTER — Other Ambulatory Visit: Payer: Self-pay | Admitting: *Deleted

## 2013-10-05 ENCOUNTER — Encounter: Payer: Self-pay | Admitting: *Deleted

## 2013-10-05 DIAGNOSIS — D490 Neoplasm of unspecified behavior of digestive system: Secondary | ICD-10-CM

## 2013-10-05 MED ORDER — PROCHLORPERAZINE MALEATE 10 MG PO TABS: 10.0000 mg | ORAL_TABLET | Freq: Four times a day (QID) | ORAL | Status: DC | PRN

## 2013-10-05 MED ORDER — DEXAMETHASONE 4 MG PO TABS: ORAL_TABLET | ORAL | Status: DC

## 2013-10-05 MED ORDER — ONDANSETRON HCL 8 MG PO TABS: 8.0000 mg | ORAL_TABLET | Freq: Two times a day (BID) | ORAL | Status: DC | PRN

## 2013-10-05 MED ORDER — LORAZEPAM 0.5 MG PO TABS: 0.5000 mg | ORAL_TABLET | Freq: Four times a day (QID) | ORAL | Status: DC | PRN

## 2013-10-05 NOTE — Telephone Encounter (Signed)
Pt aware of 2-6 chemo edu at [facility], he is aware edu RN will give him prescription for nausea meds. (JoEllen RN is setting this up) pt is also aware of 2-9 tx appointment here in our office.

## 2013-10-05 NOTE — Telephone Encounter (Signed)
Per MD pt aware moved 2-9 to 2-13, transferred call to RN for med questions

## 2013-10-05 NOTE — Progress Notes (Signed)
Faxed information to [facility] for patient teaching regarding patients newly placed PEG tube.

## 2013-10-06 ENCOUNTER — Ambulatory Visit
Admission: RE | Admit: 2013-10-06 | Discharge: 2013-10-06 | Disposition: A | Discharge status: Home / Self Care | Payer: Medicaid Other | Source: Ambulatory Visit | Attending: Radiation Oncology | Admitting: Radiation Oncology

## 2013-10-06 ENCOUNTER — Other Ambulatory Visit: Payer: Medicaid Other

## 2013-10-06 DIAGNOSIS — Z510 Encounter for antineoplastic radiation therapy: Secondary | ICD-10-CM | POA: Insufficient documentation

## 2013-10-06 DIAGNOSIS — C12 Malignant neoplasm of pyriform sinus: Secondary | ICD-10-CM

## 2013-10-06 MED ORDER — SODIUM CHLORIDE 0.9 % IJ SOLN
10.0000 mL | Freq: Once | INTRAMUSCULAR | Status: AC
  Administered 2013-10-06: 10 mL via INTRAVENOUS

## 2013-10-06 MED ORDER — HEPARIN SOD (PORK) LOCK FLUSH 100 UNIT/ML IV SOLN
500.0000 [IU] | Freq: Once | INTRAVENOUS | Status: AC
  Administered 2013-10-06: 500 [IU] via INTRAVENOUS

## 2013-10-06 NOTE — Progress Notes (Signed)
Patient here for IV start ct sim today,10/03/13+  Lab  BUN 12. CR=1.04 patient gave name and date of birth as ID, , right port acath accessed  Using sterile technique,  Using 20g 1 inch huber needle, flushed with 31ml NS, has excellent blood return, opsite placed over port cath, patient tolerated well, at 1344, not diabetic no allergy to IV dye, , c/o sore throat only, paged Lenora RT for Ct sim, at 1344pm Patient tolerated well Ct sim, he asked for nausea medication, checked med list, Per Dr.Ennever, ativan was e-scribed to [facility] today,patient then stated he had no money couldn't aford the medication, neede help wuith hthis asked if we had samples,"no c/o nausea at present, he's to start 2/`16/15, de accessed port after flushing with 58ml Ns and 32ml heparin flush , called Lauren mullis at [phone] left voice message  For Lauren to call patient on his cell phone per his request to see if she can help with the medication costs 2:24 PM

## 2013-10-09 ENCOUNTER — Ambulatory Visit: Payer: Self-pay

## 2013-10-09 ENCOUNTER — Other Ambulatory Visit: Payer: Self-pay | Admitting: Lab

## 2013-10-09 ENCOUNTER — Telehealth: Payer: Self-pay | Admitting: *Deleted

## 2013-10-09 NOTE — Telephone Encounter (Signed)
Spoke with Dorethea Clan, [place], re: rescheduling of initial Cisplatin infusion from this Friday to correspond with first RT on Monday, 2/16.  She stated change could be made.  I communicated this adjustment to Dr. Lisbeth Renshaw.  Continuing to navigate as L1 (new) patient.  Gayleen Orem, RN, BSN, [facility] Head & Neck Oncology Navigator [phone]

## 2013-10-11 ENCOUNTER — Telehealth: Payer: Self-pay | Admitting: *Deleted

## 2013-10-11 NOTE — Telephone Encounter (Signed)
Called patient to provide support and to answer any questions he has before beginning chemo and RT next Monday.  He denied any questions, concerns.  I verified 2/16 8:00 infusion at [facility] and 4:40 RT at [facility].  He verbalized understanding.  Continuing navigation as L1 patient (new patient).  Gayleen Orem, RN, BSN, [facility] Head & Neck Oncology Navigator [phone]

## 2013-10-13 ENCOUNTER — Telehealth: Payer: Self-pay | Admitting: *Deleted

## 2013-10-13 ENCOUNTER — Other Ambulatory Visit: Payer: Self-pay | Admitting: Lab

## 2013-10-13 ENCOUNTER — Ambulatory Visit: Payer: Self-pay

## 2013-10-13 ENCOUNTER — Telehealth: Payer: Self-pay | Admitting: Hematology & Oncology

## 2013-10-13 NOTE — Telephone Encounter (Signed)
In follow-up to conversation with Faith, [facility] RT, who indicated pt's RT start is being rescheduled for Wednesday, 2/18, b/c more planning time needed, spoke with Lodema Pilot, Specialty Mgr, [facility] re: rescheduling pt's chemo to start on 2/18 as well.  Ardelle Park confirmed this could be done.  I called pt to notify him of the change; he verbalized understanding.  Gayleen Orem, RN, BSN, [facility] Head & Neck Oncology Navigator [phone]

## 2013-10-13 NOTE — Telephone Encounter (Signed)
Per RN moved tx from 2-16 to 2-18 pt aware

## 2013-10-16 ENCOUNTER — Ambulatory Visit: Payer: Self-pay

## 2013-10-16 ENCOUNTER — Other Ambulatory Visit: Payer: Self-pay | Admitting: Lab

## 2013-10-16 ENCOUNTER — Ambulatory Visit: Admission: RE | Admit: 2013-10-16 | Payer: Medicaid Other | Source: Ambulatory Visit | Admitting: Radiation Oncology

## 2013-10-17 ENCOUNTER — Ambulatory Visit: Payer: Medicaid Other

## 2013-10-17 ENCOUNTER — Other Ambulatory Visit: Payer: Self-pay | Admitting: Nurse Practitioner

## 2013-10-17 DIAGNOSIS — C12 Malignant neoplasm of pyriform sinus: Secondary | ICD-10-CM

## 2013-10-18 ENCOUNTER — Ambulatory Visit: Payer: Medicaid Other

## 2013-10-18 ENCOUNTER — Ambulatory Visit: Payer: Medicaid Other | Admitting: Radiation Oncology

## 2013-10-18 ENCOUNTER — Encounter: Payer: Self-pay | Admitting: Hematology & Oncology

## 2013-10-18 ENCOUNTER — Ambulatory Visit (HOSPITAL_BASED_OUTPATIENT_CLINIC_OR_DEPARTMENT_OTHER): Payer: Medicaid Other

## 2013-10-18 ENCOUNTER — Other Ambulatory Visit (HOSPITAL_BASED_OUTPATIENT_CLINIC_OR_DEPARTMENT_OTHER): Payer: Medicaid Other | Admitting: Lab

## 2013-10-18 VITALS — BP 139/78 | HR 70 | Temp 96.8°F | Resp 18 | Wt 146.0 lb

## 2013-10-18 DIAGNOSIS — Z5111 Encounter for antineoplastic chemotherapy: Secondary | ICD-10-CM

## 2013-10-18 DIAGNOSIS — C155 Malignant neoplasm of lower third of esophagus: Secondary | ICD-10-CM

## 2013-10-18 DIAGNOSIS — C12 Malignant neoplasm of pyriform sinus: Secondary | ICD-10-CM

## 2013-10-18 DIAGNOSIS — D490 Neoplasm of unspecified behavior of digestive system: Secondary | ICD-10-CM

## 2013-10-18 LAB — CMP (CANCER CENTER ONLY)
ALT(SGPT): 26 U/L (ref 10–47)
AST: 30 U/L (ref 11–38)
Albumin: 3.8 g/dL (ref 3.3–5.5)
Alkaline Phosphatase: 45 U/L (ref 26–84)
BILIRUBIN TOTAL: 0.6 mg/dL (ref 0.20–1.60)
BUN: 12 mg/dL (ref 7–22)
CO2: 33 mEq/L (ref 18–33)
Calcium: 9.6 mg/dL (ref 8.0–10.3)
Chloride: 96 mEq/L — ABNORMAL LOW (ref 98–108)
Creat: 0.8 mg/dl (ref 0.6–1.2)
GLUCOSE: 87 mg/dL (ref 73–118)
Potassium: 4.5 mEq/L (ref 3.3–4.7)
Sodium: 139 mEq/L (ref 128–145)
Total Protein: 7.6 g/dL (ref 6.4–8.1)

## 2013-10-18 LAB — CBC WITH DIFFERENTIAL (CANCER CENTER ONLY)
BASO#: 0 10*3/uL (ref 0.0–0.2)
BASO%: 0.2 % (ref 0.0–2.0)
EOS ABS: 0.1 10*3/uL (ref 0.0–0.5)
EOS%: 1.9 % (ref 0.0–7.0)
HEMATOCRIT: 44.5 % (ref 38.7–49.9)
HGB: 14.5 g/dL (ref 13.0–17.1)
LYMPH#: 1.5 10*3/uL (ref 0.9–3.3)
LYMPH%: 34 % (ref 14.0–48.0)
MCH: 27.9 pg — ABNORMAL LOW (ref 28.0–33.4)
MCHC: 32.6 g/dL (ref 32.0–35.9)
MCV: 86 fL (ref 82–98)
MONO#: 0.8 10*3/uL (ref 0.1–0.9)
MONO%: 17.6 % — ABNORMAL HIGH (ref 0.0–13.0)
NEUT#: 2 10*3/uL (ref 1.5–6.5)
NEUT%: 46.3 % (ref 40.0–80.0)
Platelets: 230 10*3/uL (ref 145–400)
RBC: 5.2 10*6/uL (ref 4.20–5.70)
RDW: 13.3 % (ref 11.1–15.7)
WBC: 4.3 10*3/uL (ref 4.0–10.0)

## 2013-10-18 MED ORDER — SODIUM CHLORIDE 0.9 % IV SOLN
150.0000 mg | Freq: Once | INTRAVENOUS | Status: AC
  Administered 2013-10-18: 150 mg via INTRAVENOUS
  Filled 2013-10-18: qty 5

## 2013-10-18 MED ORDER — SODIUM CHLORIDE 0.9 % IV SOLN
100.0000 mg/m2 | Freq: Once | INTRAVENOUS | Status: AC
  Administered 2013-10-18: 175 mg via INTRAVENOUS
  Filled 2013-10-18: qty 175

## 2013-10-18 MED ORDER — PALONOSETRON HCL INJECTION 0.25 MG/5ML
0.2500 mg | Freq: Once | INTRAVENOUS | Status: AC
  Administered 2013-10-18: 0.25 mg via INTRAVENOUS

## 2013-10-18 MED ORDER — HEPARIN SOD (PORK) LOCK FLUSH 100 UNIT/ML IV SOLN
500.0000 [IU] | Freq: Once | INTRAVENOUS | Status: AC | PRN
  Administered 2013-10-18: 500 [IU]
  Filled 2013-10-18: qty 5

## 2013-10-18 MED ORDER — MANNITOL 25 % IV SOLN
Freq: Once | INTRAVENOUS | Status: AC
  Administered 2013-10-18: 09:00:00 via INTRAVENOUS
  Filled 2013-10-18: qty 10

## 2013-10-18 MED ORDER — DEXAMETHASONE SODIUM PHOSPHATE 20 MG/5ML IJ SOLN
12.0000 mg | Freq: Once | INTRAMUSCULAR | Status: AC
  Administered 2013-10-18: 12 mg via INTRAVENOUS

## 2013-10-18 MED ORDER — PALONOSETRON HCL INJECTION 0.25 MG/5ML
INTRAVENOUS | Status: AC
  Filled 2013-10-18: qty 5

## 2013-10-18 MED ORDER — SODIUM CHLORIDE 0.9 % IJ SOLN
10.0000 mL | INTRAMUSCULAR | Status: DC | PRN
  Administered 2013-10-18: 10 mL
  Filled 2013-10-18: qty 10

## 2013-10-18 MED ORDER — DEXAMETHASONE SODIUM PHOSPHATE 20 MG/5ML IJ SOLN
INTRAMUSCULAR | Status: AC
  Filled 2013-10-18: qty 5

## 2013-10-18 MED ORDER — SODIUM CHLORIDE 0.9 % IV SOLN
INTRAVENOUS | Status: DC
  Administered 2013-10-18: 09:00:00 via INTRAVENOUS

## 2013-10-18 NOTE — Progress Notes (Signed)
Patient was given instruction on how to take her nausea medication by pharmacy. Copy of medication was given to the patient. Patient verbalized understanding on how to take his medication. Will continue to monitor.

## 2013-10-18 NOTE — Patient Instructions (Signed)
[facility] Discharge Instructions for Patients Receiving Chemotherapy  Today you received the following chemotherapy agents Cisplatin  To help prevent nausea and vomiting after your treatment, we encourage you to take your nausea medication as prescribed. If you develop nausea and vomiting that is not controlled by your nausea medication, call the clinic. If it is after clinic hours your family physician or the after hours number for the clinic or go to the Emergency Department.   BELOW ARE SYMPTOMS THAT SHOULD BE REPORTED IMMEDIATELY:  *FEVER GREATER THAN 100.5 F  *CHILLS WITH OR WITHOUT FEVER  NAUSEA AND VOMITING THAT IS NOT CONTROLLED WITH YOUR NAUSEA MEDICATION  *UNUSUAL SHORTNESS OF BREATH  *UNUSUAL BRUISING OR BLEEDING  TENDERNESS IN MOUTH AND THROAT WITH OR WITHOUT PRESENCE OF ULCERS  *URINARY PROBLEMS  *BOWEL PROBLEMS  UNUSUAL RASH Items with * indicate a potential emergency and should be followed up as soon as possible.   Please let the nurse know about any problems that you may have experienced. Feel free to call the clinic you have any questions or concerns. The clinic phone number is [phone].   I have been informed and understand all the instructions given to me. I know to contact the clinic, my physician, or go to the Emergency Department if any problems should occur. I do not have any questions at this time, but understand that I may call the clinic during office hours   should I have any questions or need assistance in obtaining follow up care.    __________________________________________  _____________  __________ Signature of Patient or Authorized Representative            Date                   Time    __________________________________________ Nurse's Signature

## 2013-10-19 ENCOUNTER — Ambulatory Visit
Admission: RE | Admit: 2013-10-19 | Discharge: 2013-10-19 | Disposition: A | Discharge status: Home / Self Care | Payer: Medicaid Other | Source: Ambulatory Visit | Attending: Radiation Oncology | Admitting: Radiation Oncology

## 2013-10-19 ENCOUNTER — Telehealth: Payer: Self-pay | Admitting: Oncology

## 2013-10-19 ENCOUNTER — Encounter: Payer: Self-pay | Admitting: *Deleted

## 2013-10-19 NOTE — Telephone Encounter (Signed)
Chemo follow-up call, understands correct way to take nausea meds, etc. No complaints voiced. Appetite OK. Instructed to call with any questions, verbalized understanding.

## 2013-10-20 ENCOUNTER — Ambulatory Visit
Admission: RE | Admit: 2013-10-20 | Discharge: 2013-10-20 | Disposition: A | Discharge status: Home / Self Care | Payer: Medicaid Other | Source: Ambulatory Visit | Attending: Radiation Oncology | Admitting: Radiation Oncology

## 2013-10-20 VITALS — BP 178/82 | HR 83 | Temp 97.3°F

## 2013-10-20 DIAGNOSIS — C12 Malignant neoplasm of pyriform sinus: Secondary | ICD-10-CM

## 2013-10-20 NOTE — Progress Notes (Signed)
   Department of Radiation Oncology  Phone:  [phone] Fax:        [phone]  Weekly Treatment Note    Name: Collin Young Date: 10/20/2013 MRN: [id number] DOB: 02-Feb-1951   Current dose: 4.24 Gy  Current fraction: 2   MEDICATIONS: Current Outpatient Prescriptions  Medication Sig Dispense Refill  . dexamethasone (DECADRON) 4 MG tablet Take 2 tablets by mouth once a day on the day after chemotherapy and then take 2 tablets two times a day for 2 days. Take with food.  30 tablet  1  . LORazepam (ATIVAN) 0.5 MG tablet Take 1 tablet (0.5 mg total) by mouth every 6 (six) hours as needed (Nausea or vomiting).  30 tablet  0  . ondansetron (ZOFRAN) 8 MG tablet Take 1 tablet (8 mg total) by mouth 2 (two) times daily as needed. Take two times a day as needed for nausea or vomiting starting on the third day after chemotherapy.  30 tablet  1  . prochlorperazine (COMPAZINE) 10 MG tablet Take 1 tablet (10 mg total) by mouth every 6 (six) hours as needed (Nausea or vomiting).  30 tablet  1   No current facility-administered medications for this encounter.     ALLERGIES: Review of patient's allergies indicates no known allergies.   LABORATORY DATA:  Lab Results  Component Value Date   WBC 4.3 10/18/2013   HGB 14.5 10/18/2013   HCT 44.5 10/18/2013   MCV 86 10/18/2013   PLT 230 10/18/2013   Lab Results  Component Value Date   NA 139 10/18/2013   K 4.5 10/18/2013   CL 96* 10/18/2013   CO2 33 10/18/2013   Lab Results  Component Value Date   ALT 26 10/18/2013   AST 30 10/18/2013   ALKPHOS 45 10/18/2013   BILITOT 0.60 10/18/2013     NARRATIVE: Collin Young was seen today for weekly treatment management. The chart was checked and the patient's films were reviewed. The patient is doing well in his first week of treatment. No problems with radiation so far. The patient does complain of checkups and we discussed various measures to possibly address this.  PHYSICAL EXAMINATION:  temperature is 97.3 F (36.3 C). His blood pressure is 178/82 and his pulse is 83. His oxygen saturation is 100%.        ASSESSMENT: The patient is doing satisfactorily with treatment.  PLAN: We will continue with the patient's radiation treatment as planned. The patient will let us know if his checkups continue. I did discuss with them that medication is sometimes used for this but this typically is after a number of other things have been tried.

## 2013-10-20 NOTE — Progress Notes (Signed)
Reviewed routine of clinic.Patient education kept simple.Had to keep repeating information.Told not to shave and given biafine to apply when skin changes.Patient will need lots of reinforcement.

## 2013-10-21 ENCOUNTER — Encounter: Payer: Self-pay | Admitting: *Deleted

## 2013-10-21 NOTE — Progress Notes (Signed)
Met with patient during initial RT to provide support and encouragement.  Re-educated on anticipated SEs of RT, he verbalized understanding.  He denied needs/concerns, I encouraged him to contact me if that should change.  He verbalized understanding.  Continuing navigation as L1 patient (new patient).  Gayleen Orem, RN, BSN, [facility] Head & Neck Oncology Navigator [phone]

## 2013-10-21 NOTE — Progress Notes (Signed)
Entry error

## 2013-10-23 ENCOUNTER — Ambulatory Visit
Admission: RE | Admit: 2013-10-23 | Discharge: 2013-10-23 | Disposition: A | Discharge status: Home / Self Care | Payer: Medicaid Other | Source: Ambulatory Visit | Attending: Radiation Oncology | Admitting: Radiation Oncology

## 2013-10-23 ENCOUNTER — Telehealth: Payer: Self-pay | Admitting: Hematology & Oncology

## 2013-10-23 NOTE — Telephone Encounter (Signed)
Pt aware moved 3-2 to 3-11

## 2013-10-24 ENCOUNTER — Ambulatory Visit
Admission: RE | Admit: 2013-10-24 | Discharge: 2013-10-24 | Disposition: A | Discharge status: Home / Self Care | Payer: Medicaid Other | Source: Ambulatory Visit | Attending: Radiation Oncology | Admitting: Radiation Oncology

## 2013-10-24 ENCOUNTER — Ambulatory Visit (HOSPITAL_COMMUNITY): Payer: Medicaid - Dental | Admitting: Dentistry

## 2013-10-25 ENCOUNTER — Ambulatory Visit
Admission: RE | Admit: 2013-10-25 | Discharge: 2013-10-25 | Disposition: A | Discharge status: Home / Self Care | Payer: Medicaid Other | Source: Ambulatory Visit | Attending: Radiation Oncology | Admitting: Radiation Oncology

## 2013-10-26 ENCOUNTER — Ambulatory Visit
Admission: RE | Admit: 2013-10-26 | Discharge: 2013-10-26 | Disposition: A | Discharge status: Home / Self Care | Payer: Medicaid Other | Source: Ambulatory Visit | Attending: Radiation Oncology | Admitting: Radiation Oncology

## 2013-10-27 ENCOUNTER — Ambulatory Visit
Admission: RE | Admit: 2013-10-27 | Discharge: 2013-10-27 | Disposition: A | Discharge status: Home / Self Care | Payer: Medicaid Other | Source: Ambulatory Visit | Attending: Radiation Oncology | Admitting: Radiation Oncology

## 2013-10-27 ENCOUNTER — Encounter: Payer: Self-pay | Admitting: Radiation Oncology

## 2013-10-27 ENCOUNTER — Telehealth: Payer: Self-pay | Admitting: Hematology & Oncology

## 2013-10-27 VITALS — BP 135/67 | HR 92 | Temp 98.4°F | Resp 20 | Wt 144.0 lb

## 2013-10-27 DIAGNOSIS — C12 Malignant neoplasm of pyriform sinus: Secondary | ICD-10-CM

## 2013-10-27 NOTE — Progress Notes (Addendum)
Weekly rad txs, larynx 7/33 compleetd, eating 3 meals a day stated, drinks water and gingerale, flushes peg tube wioth 59ml free water daily, had salmon croquettes ,fruit cocktail, , scrambled egss, raison cakes for breakfast, , taste buds diminished but "I still eat", sore throat, started , no dry mouth stated nausea slight at times, has zofran and ativan prn, hasn't had to use,Chemotherapy scheduled 11/08/13 5:18 PM

## 2013-10-27 NOTE — Telephone Encounter (Signed)
na

## 2013-10-27 NOTE — Progress Notes (Signed)
  Radiation Oncology         [phone]) [phone] ________________________________  Name: Collin Young MRN: [id number]  Date: 10/27/2013  DOB: 1951-05-04  Weekly Radiation Therapy Management  Current Dose: 14.84 Gy     Planned Dose:  69.96 Gy  Narrative . . . . . . . . The patient presents for routine under treatment assessment.                                   The patient is without complaint.                                 Set-up films were reviewed.                                 The chart was checked. Physical Findings. . .  weight is 144 lb (65.318 kg). His oral temperature is 98.4 F (36.9 C). His blood pressure is 135/67 and his pulse is 92. His respiration is 20 and oxygen saturation is 100%. . Weight essentially stable.  No significant changes. Impression . . . . . . . The patient is tolerating radiation. Plan . . . . . . . . . . . . Continue treatment as planned.  ________________________________  Sheral Apley. Tammi Klippel, M.D.

## 2013-10-30 ENCOUNTER — Other Ambulatory Visit: Payer: Self-pay | Admitting: Lab

## 2013-10-30 ENCOUNTER — Ambulatory Visit (HOSPITAL_COMMUNITY): Payer: Medicaid - Dental | Admitting: Dentistry

## 2013-10-30 ENCOUNTER — Ambulatory Visit: Payer: Self-pay | Admitting: Hematology & Oncology

## 2013-10-30 ENCOUNTER — Ambulatory Visit: Payer: Self-pay

## 2013-10-30 ENCOUNTER — Encounter (HOSPITAL_COMMUNITY): Payer: Self-pay | Admitting: Dentistry

## 2013-10-30 ENCOUNTER — Ambulatory Visit
Admission: RE | Admit: 2013-10-30 | Discharge: 2013-10-30 | Disposition: A | Discharge status: Home / Self Care | Payer: Medicaid Other | Source: Ambulatory Visit | Attending: Radiation Oncology | Admitting: Radiation Oncology

## 2013-10-30 VITALS — BP 147/77 | HR 73 | Temp 98.3°F | Wt 144.0 lb

## 2013-10-30 DIAGNOSIS — Z972 Presence of dental prosthetic device (complete) (partial): Secondary | ICD-10-CM

## 2013-10-30 DIAGNOSIS — K08109 Complete loss of teeth, unspecified cause, unspecified class: Secondary | ICD-10-CM

## 2013-10-30 DIAGNOSIS — K117 Disturbances of salivary secretion: Secondary | ICD-10-CM

## 2013-10-30 DIAGNOSIS — Z0189 Encounter for other specified special examinations: Secondary | ICD-10-CM

## 2013-10-30 DIAGNOSIS — K000 Anodontia: Secondary | ICD-10-CM

## 2013-10-30 DIAGNOSIS — R682 Dry mouth, unspecified: Secondary | ICD-10-CM

## 2013-10-30 DIAGNOSIS — R432 Parageusia: Secondary | ICD-10-CM

## 2013-10-30 DIAGNOSIS — K0820 Unspecified atrophy of edentulous alveolar ridge: Secondary | ICD-10-CM

## 2013-10-30 DIAGNOSIS — C12 Malignant neoplasm of pyriform sinus: Secondary | ICD-10-CM

## 2013-10-30 DIAGNOSIS — R1310 Dysphagia, unspecified: Secondary | ICD-10-CM

## 2013-10-30 NOTE — Progress Notes (Signed)
10/30/2013  Patient:            Collin Young Date of Birth:  04-08-1951 MRN:                [id number]  BP 147/77  Pulse 73  Temp(Src) 98.3 F (36.8 C) (Oral)  Wt 144 lb (65.318 kg)  Collin Young presents for periodic oral examination during radiation therapy. Patient has completed 7/33 treatments.  REVIEW OF CHIEF COMPLAINTS:  DRY MOUTH: Yes HARD TO SWALLOW: Yes  HURT TO SWALLOW: Yes TASTE CHANGES: Yes SORES IN MOUTH: No TRISMUS: No problems with trismus symptoms WEIGHT: 144 pounds.  HOME OH REGIMEN:  BRUSHING: Brushing his tongue daily. FLOSSING: Not applicable RINSING: Using salt water and baking soda rinses  FLUORIDE: Not applicable TRISMUS EXERCISES:  Maximum interincisal opening: 55 mm   DENTAL EXAM:  Oral Hygiene:(PLAQUE): Edentulous LOCATION OF MUCOSITIS: None noted DESCRIPTION OF SALIVA: Incipient xerostomia ANY EXPOSED BONE: None noted OTHER WATCHED AREAS: None DX: Xerostomia, Dysgeusia, Dysphagia and Odynophagia  RECOMMENDATIONS: 1. Brush tongue daily. 2. Use trismus exercises as directed. 3. Use Biotene Rinse or salt water/baking soda rinses. 4. Multiple sips of water as needed. 5. Return to clinic in two months for periodic oral exam after radiation therapy. 6. Schedule  nutritional consultation as soon as possible Call if problems before then.  Lenn Cal, DDS

## 2013-10-30 NOTE — Patient Instructions (Signed)
RECOMMENDATIONS: 1. Brush tongue daily. 2. Use trismus exercises as directed. 3. Use Biotene Rinse or salt water/baking soda rinses. 4. Multiple sips of water as needed. 5. Return to clinic in two months for periodic oral exam after radiation therapy. 6. Schedule  nutritional consultation as soon as possible Call if problems before then. Dr. Enrique Sack

## 2013-10-31 ENCOUNTER — Telehealth: Payer: Self-pay | Admitting: *Deleted

## 2013-10-31 ENCOUNTER — Ambulatory Visit
Admission: RE | Admit: 2013-10-31 | Discharge: 2013-10-31 | Disposition: A | Discharge status: Home / Self Care | Payer: Medicaid Other | Source: Ambulatory Visit | Attending: Radiation Oncology | Admitting: Radiation Oncology

## 2013-10-31 DIAGNOSIS — C12 Malignant neoplasm of pyriform sinus: Secondary | ICD-10-CM

## 2013-10-31 NOTE — Telephone Encounter (Signed)
Called patient to notify him of a 11/09/13 appt with nutritionist Dory Peru at 4:00 preceding is RT appt at 5:00.  I explained check-in/registration procedure.  He verbalized understanding.  Continuing navigation as L1 patient (new patient).  Gayleen Orem, RN, BSN, [facility] Head & Neck Oncology Navigator [phone]

## 2013-11-01 ENCOUNTER — Ambulatory Visit
Admission: RE | Admit: 2013-11-01 | Discharge: 2013-11-01 | Disposition: A | Discharge status: Home / Self Care | Payer: Medicaid Other | Source: Ambulatory Visit | Attending: Radiation Oncology | Admitting: Radiation Oncology

## 2013-11-02 ENCOUNTER — Encounter: Payer: Self-pay | Admitting: *Deleted

## 2013-11-02 ENCOUNTER — Ambulatory Visit
Admission: RE | Admit: 2013-11-02 | Discharge: 2013-11-02 | Disposition: A | Discharge status: Home / Self Care | Payer: Medicaid Other | Source: Ambulatory Visit | Attending: Radiation Oncology | Admitting: Radiation Oncology

## 2013-11-03 ENCOUNTER — Ambulatory Visit
Admission: RE | Admit: 2013-11-03 | Discharge: 2013-11-03 | Disposition: A | Discharge status: Home / Self Care | Payer: Medicaid Other | Source: Ambulatory Visit | Attending: Radiation Oncology | Admitting: Radiation Oncology

## 2013-11-03 ENCOUNTER — Encounter: Payer: Self-pay | Admitting: Radiation Oncology

## 2013-11-03 VITALS — BP 159/83 | HR 91 | Temp 98.5°F | Resp 20 | Wt 139.4 lb

## 2013-11-03 DIAGNOSIS — C12 Malignant neoplasm of pyriform sinus: Secondary | ICD-10-CM

## 2013-11-03 MED ORDER — MAGIC MOUTHWASH W/LIDOCAINE: 5.0000 mL | Freq: Four times a day (QID) | ORAL | Status: DC | PRN

## 2013-11-03 NOTE — Progress Notes (Addendum)
Weekly rad txs larynx 12/33 completed, hoarseness, loose congestive cough,  Clear phelgm , 100% room air, was nauseated last night took hard candy and sucked on that ,nausea resolved, blended up home made vegetable soup and drank through a straw last night and kept it all down stated patient, has throat pain, drinks swater, applesauice and scrambled egg this am, some difficulty swallowing stated , using biafine on neck bidafter rad txs, skin intact 4:59 PM

## 2013-11-03 NOTE — Progress Notes (Signed)
Met with patient prior to his RT.  Patient stated that he is beginning to experience throat pain but not at a level that he needs medication.  I encouraged him to call me if this changes before his next follow-up appt with Dr. Lisbeth Renshaw.  Patient verbalized understanding.  Initiating navigation as L2 patient (treatments established) with this encounter.  Gayleen Orem, RN, BSN, [facility] Head & Neck Oncology Navigator [phone]

## 2013-11-03 NOTE — Addendum Note (Signed)
Encounter addended by: Marye Round, MD on: 11/03/2013  6:35 PM<BR>     Documentation filed: Visit Diagnoses, Notes Section

## 2013-11-03 NOTE — Progress Notes (Signed)
   Department of Radiation Oncology  Phone:  [phone] Fax:        [phone]  Weekly Treatment Note    Name: Collin Young Date: 11/03/2013 MRN: [id number] DOB: 1950/09/08   Current dose: 25.4 Gy  Current fraction: 12   MEDICATIONS: Current Outpatient Prescriptions  Medication Sig Dispense Refill  . dexamethasone (DECADRON) 4 MG tablet Take 2 tablets by mouth once a day on the day after chemotherapy and then take 2 tablets two times a day for 2 days. Take with food.  30 tablet  1  . LORazepam (ATIVAN) 0.5 MG tablet Take 1 tablet (0.5 mg total) by mouth every 6 (six) hours as needed (Nausea or vomiting).  30 tablet  0  . ondansetron (ZOFRAN) 8 MG tablet Take 1 tablet (8 mg total) by mouth 2 (two) times daily as needed. Take two times a day as needed for nausea or vomiting starting on the third day after chemotherapy.  30 tablet  1  . prochlorperazine (COMPAZINE) 10 MG tablet Take 1 tablet (10 mg total) by mouth every 6 (six) hours as needed (Nausea or vomiting).  30 tablet  1   No current facility-administered medications for this encounter.     ALLERGIES: Review of patient's allergies indicates no known allergies.   LABORATORY DATA:  Lab Results  Component Value Date   WBC 4.3 10/18/2013   HGB 14.5 10/18/2013   HCT 44.5 10/18/2013   MCV 86 10/18/2013   PLT 230 10/18/2013   Lab Results  Component Value Date   NA 139 10/18/2013   K 4.5 10/18/2013   CL 96* 10/18/2013   CO2 33 10/18/2013   Lab Results  Component Value Date   ALT 26 10/18/2013   AST 30 10/18/2013   ALKPHOS 45 10/18/2013   BILITOT 0.60 10/18/2013     NARRATIVE: Collin Young was seen today for weekly treatment management. The chart was checked and the patient's films were reviewed. The patient states that he is doing fairly well. He does complain of worsening throat pain. He continues to swallow but is blending his food currently.  PHYSICAL EXAMINATION: weight is 139 lb 6.4 oz (63.231 kg). His oral  temperature is 98.5 F (36.9 C). His blood pressure is 159/83 and his pulse is 91. His respiration is 20 and oxygen saturation is 100%.      the patient's skin looks good at this time. No significant mucositis currently within the oral cavity.  ASSESSMENT: The patient is doing satisfactorily with treatment.  PLAN: We will continue with the patient's radiation treatment as planned. The patient has been given a prescription for Magic mouthwash.

## 2013-11-03 NOTE — Progress Notes (Signed)
  Radiation Oncology         [phone] ________________________________  Name: Collin Young MRN: [id number]  Date: 10/06/2013  DOB: 27-Aug-1951  SIMULATION AND TREATMENT PLANNING NOTE   CONSENT VERIFIED: yes   SET UP: Patient is set-up supine   IMMOBILIZATION: The following immobilization is used:Aquaplast Mask. This complex treatment device will be used on a daily basis during the patient's treatment.  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  Identity was confirmed.  All relevant records and images related to the planned course of therapy were reviewed.  Then, the patient was positioned in a stable reproducible clinical set-up for radiation therapy using an aquaplast mask.  Skin markings were placed.  The CT images were loaded into the planning software where the target and avoidance structures were contoured.The radiation prescription was entered and confirmed.  The patient will receive 69.96 Gy in 33 fractions.  Daily image guidance is ordered, and this will be used on a daily basis. This is necessary to ensure accurate and precise localization of the target in addition to accurate alignment of the normal tissue structures in this region.  Treatment planning then occurred.  I have requested : Intensity Modulated Radiotherapy (IMRT) is medically necessary for this case for the following reason:  Dose homogeneity and treatment of a head and neck site. The target is in close proximity to critical normal structures, as well as the parotid glands which may negatively impact the patient's quality of life if not maximally spared given the constraints of the overall treatment plan. IMRT is thus medically necessary to appropriately treat the patient.   Special treatment procedure The patient will receive chemotherapy during the course of radiation treatment. The patient may experience increased  or overlapping toxicity due to this combined-modality approach and the patient will be monitored for such problems. This may include extra lab work as necessary. This therefore constitutes a special treatment procedure.    ________________________________   Jodelle Gross, MD, PhD

## 2013-11-06 ENCOUNTER — Ambulatory Visit
Admission: RE | Admit: 2013-11-06 | Discharge: 2013-11-06 | Disposition: A | Discharge status: Home / Self Care | Payer: Medicaid Other | Source: Ambulatory Visit | Attending: Radiation Oncology | Admitting: Radiation Oncology

## 2013-11-06 ENCOUNTER — Telehealth: Payer: Self-pay

## 2013-11-06 ENCOUNTER — Telehealth: Payer: Self-pay | Admitting: *Deleted

## 2013-11-06 NOTE — Telephone Encounter (Signed)
Clarification, MMW , 228ml of 2% viscous lidocaine, 5ml each of extra St.Maalox Plus, Diphenhydramine, Nystatin (total 259ml) called to [facility], then called Mr. Blandon that RX has been called in  11:41 AM

## 2013-11-07 ENCOUNTER — Other Ambulatory Visit: Payer: Self-pay | Admitting: Nurse Practitioner

## 2013-11-07 ENCOUNTER — Ambulatory Visit
Admission: RE | Admit: 2013-11-07 | Discharge: 2013-11-07 | Disposition: A | Discharge status: Home / Self Care | Payer: Medicaid Other | Source: Ambulatory Visit | Attending: Radiation Oncology | Admitting: Radiation Oncology

## 2013-11-07 DIAGNOSIS — C12 Malignant neoplasm of pyriform sinus: Secondary | ICD-10-CM

## 2013-11-07 NOTE — Telephone Encounter (Signed)
e

## 2013-11-08 ENCOUNTER — Ambulatory Visit: Payer: Medicaid Other

## 2013-11-08 ENCOUNTER — Ambulatory Visit (HOSPITAL_BASED_OUTPATIENT_CLINIC_OR_DEPARTMENT_OTHER): Payer: Medicaid Other | Admitting: Hematology & Oncology

## 2013-11-08 ENCOUNTER — Ambulatory Visit (HOSPITAL_BASED_OUTPATIENT_CLINIC_OR_DEPARTMENT_OTHER): Payer: Medicaid Other

## 2013-11-08 ENCOUNTER — Encounter: Payer: Self-pay | Admitting: *Deleted

## 2013-11-08 ENCOUNTER — Encounter: Payer: Self-pay | Admitting: Hematology & Oncology

## 2013-11-08 ENCOUNTER — Other Ambulatory Visit (HOSPITAL_BASED_OUTPATIENT_CLINIC_OR_DEPARTMENT_OTHER): Payer: Medicaid Other | Admitting: Lab

## 2013-11-08 VITALS — BP 156/71 | HR 91 | Temp 98.0°F | Resp 18 | Ht 64.0 in | Wt 133.0 lb

## 2013-11-08 DIAGNOSIS — C12 Malignant neoplasm of pyriform sinus: Secondary | ICD-10-CM

## 2013-11-08 DIAGNOSIS — K208 Other esophagitis without bleeding: Secondary | ICD-10-CM

## 2013-11-08 DIAGNOSIS — D490 Neoplasm of unspecified behavior of digestive system: Secondary | ICD-10-CM

## 2013-11-08 DIAGNOSIS — R339 Retention of urine, unspecified: Secondary | ICD-10-CM

## 2013-11-08 DIAGNOSIS — R1310 Dysphagia, unspecified: Secondary | ICD-10-CM

## 2013-11-08 DIAGNOSIS — Z931 Gastrostomy status: Secondary | ICD-10-CM

## 2013-11-08 DIAGNOSIS — Z5111 Encounter for antineoplastic chemotherapy: Secondary | ICD-10-CM

## 2013-11-08 LAB — CMP (CANCER CENTER ONLY)
ALT: 25 U/L (ref 10–47)
AST: 23 U/L (ref 11–38)
Albumin: 3.4 g/dL (ref 3.3–5.5)
Alkaline Phosphatase: 46 U/L (ref 26–84)
BILIRUBIN TOTAL: 0.6 mg/dL (ref 0.20–1.60)
BUN, Bld: 18 mg/dL (ref 7–22)
CALCIUM: 9.2 mg/dL (ref 8.0–10.3)
CHLORIDE: 95 meq/L — AB (ref 98–108)
CO2: 33 meq/L (ref 18–33)
CREATININE: 1.2 mg/dL (ref 0.6–1.2)
Glucose, Bld: 112 mg/dL (ref 73–118)
Potassium: 4 mEq/L (ref 3.3–4.7)
SODIUM: 135 meq/L (ref 128–145)
TOTAL PROTEIN: 6.8 g/dL (ref 6.4–8.1)

## 2013-11-08 LAB — CBC WITH DIFFERENTIAL (CANCER CENTER ONLY)
BASO#: 0 10*3/uL (ref 0.0–0.2)
BASO%: 0.5 % (ref 0.0–2.0)
EOS%: 2.9 % (ref 0.0–7.0)
Eosinophils Absolute: 0.1 10*3/uL (ref 0.0–0.5)
HCT: 41.9 % (ref 38.7–49.9)
HGB: 13.9 g/dL (ref 13.0–17.1)
LYMPH#: 0.4 10*3/uL — AB (ref 0.9–3.3)
LYMPH%: 18.4 % (ref 14.0–48.0)
MCH: 28.3 pg (ref 28.0–33.4)
MCHC: 33.2 g/dL (ref 32.0–35.9)
MCV: 85 fL (ref 82–98)
MONO#: 0.7 10*3/uL (ref 0.1–0.9)
MONO%: 33.5 % — ABNORMAL HIGH (ref 0.0–13.0)
NEUT#: 0.9 10*3/uL — ABNORMAL LOW (ref 1.5–6.5)
NEUT%: 44.7 % (ref 40.0–80.0)
PLATELETS: 228 10*3/uL (ref 145–400)
RBC: 4.91 10*6/uL (ref 4.20–5.70)
RDW: 13.8 % (ref 11.1–15.7)
WBC: 2.1 10*3/uL — AB (ref 4.0–10.0)

## 2013-11-08 MED ORDER — HYDROMORPHONE HCL PF 2 MG/ML IJ SOLN
INTRAMUSCULAR | Status: AC
  Filled 2013-11-08: qty 1

## 2013-11-08 MED ORDER — DEXTROSE-NACL 5-0.45 % IV SOLN
Freq: Once | INTRAVENOUS | Status: AC
Start: 2013-11-08 — End: 2013-11-08
  Administered 2013-11-08: 10:00:00 via INTRAVENOUS
  Filled 2013-11-08: qty 10

## 2013-11-08 MED ORDER — FOSAPREPITANT DIMEGLUMINE INJECTION 150 MG
150.0000 mg | Freq: Once | INTRAVENOUS | Status: AC
  Administered 2013-11-08: 150 mg via INTRAVENOUS
  Filled 2013-11-08: qty 5

## 2013-11-08 MED ORDER — HYDROMORPHONE HCL PF 1 MG/ML IJ SOLN
1.0000 mg | Freq: Once | INTRAMUSCULAR | Status: DC
  Filled 2013-11-08: qty 1

## 2013-11-08 MED ORDER — SODIUM CHLORIDE 0.9 % IV SOLN
80.0000 mg/m2 | Freq: Once | INTRAVENOUS | Status: AC
  Administered 2013-11-08: 140 mg via INTRAVENOUS
  Filled 2013-11-08: qty 140

## 2013-11-08 MED ORDER — HYDROMORPHONE HCL PF 1 MG/ML IJ SOLN
1.0000 mg | INTRAMUSCULAR | Status: DC | PRN
Start: 2013-11-08 — End: 2013-11-08
  Administered 2013-11-08: 1 mg via INTRAVENOUS
  Filled 2013-11-08: qty 1

## 2013-11-08 MED ORDER — HEPARIN SOD (PORK) LOCK FLUSH 100 UNIT/ML IV SOLN
500.0000 [IU] | Freq: Once | INTRAVENOUS | Status: DC | PRN
  Filled 2013-11-08: qty 5

## 2013-11-08 MED ORDER — DEXAMETHASONE SODIUM PHOSPHATE 20 MG/5ML IJ SOLN
12.0000 mg | Freq: Once | INTRAMUSCULAR | Status: AC
  Administered 2013-11-08: 12 mg via INTRAVENOUS

## 2013-11-08 MED ORDER — SODIUM CHLORIDE 0.9 % IV SOLN
INTRAVENOUS | Status: DC
  Administered 2013-11-08: 16:00:00 via INTRAVENOUS
  Filled 2013-11-08: qty 50

## 2013-11-08 MED ORDER — MORPHINE SULFATE (CONCENTRATE) 20 MG/ML PO SOLN: 10.0000 mg | ORAL | Status: DC | PRN

## 2013-11-08 MED ORDER — FENTANYL 12 MCG/HR TD PT72: 12.5000 ug | MEDICATED_PATCH | TRANSDERMAL | Status: DC

## 2013-11-08 MED ORDER — PALONOSETRON HCL INJECTION 0.25 MG/5ML
INTRAVENOUS | Status: AC
  Filled 2013-11-08: qty 5

## 2013-11-08 MED ORDER — DEXAMETHASONE SODIUM PHOSPHATE 20 MG/5ML IJ SOLN
INTRAMUSCULAR | Status: AC
  Filled 2013-11-08: qty 5

## 2013-11-08 MED ORDER — OXYCODONE HCL 20 MG/ML PO CONC: 10.0000 mg | ORAL | Status: DC | PRN

## 2013-11-08 MED ORDER — SODIUM CHLORIDE 0.9 % IJ SOLN
10.0000 mL | INTRAMUSCULAR | Status: DC | PRN
  Filled 2013-11-08: qty 10

## 2013-11-08 MED ORDER — PALONOSETRON HCL INJECTION 0.25 MG/5ML
0.2500 mg | Freq: Once | INTRAVENOUS | Status: AC
  Administered 2013-11-08: 0.25 mg via INTRAVENOUS

## 2013-11-08 NOTE — Progress Notes (Signed)
  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 He is status post 1 cycle of cisplatin   INTERIM HISTORY:  Collin Young comes in for followup. He still has 24 radiation treatments left. He's had one dose of cis-platinum.    He has radiation esophagitis now. We will have to start him on tube feeds. He has the feeding tube in place.    He lost 12 pounds. He is is having a lot of odynophagia. His 1 I would eat all that much. He is taking some pured food. Again this is painful for him. We will have to get him on a fentanyl patch and also some liquid pain medicine.    He's had no bleeding. He's had no cough. He is producing quite a bit of mucus but this is clear.    He's had no diarrhea. There's been no leg swelling. He's had no rashes.   PHYSICAL EXAMINATION:  Somewhat thin African American Collin Young. He is in mild distress secondary to pain. Vital signs her temperature of 98. Pulse 91. Blood pressure 156/71. Weight is 133 pounds. Head and neck exam shows no obvious adenopathy in the neck. He has some radiation changes on his neck. He has pharyngeal erythema. No obvious adenopathy or masses noted in the neck. Lungs are clear. Cardiac exam regular irrelevant. Abdomen soft. Extremities shows no clubbing cyanosis or edema. Skin exam slightly dry. No neurological deficits are noted. His PEG site is intact.   LABORATORY STUDIES:  White cell count 2.1. Hemoglobin 11.9. Platelet count is 228. Sodium 135. Potassium 4. Albumin 3.4. Calcium 9.2. BUN 18 creatinine 1.2.   IMPRESSION:  Collin Young is a 63 year old gentleman. He has a squamous cell carcinoma of the left piriform sinus. This is a second primary. He is being her curative radiation chemotherapy. We will go ahead and give him the cis-platinum today. I'll reduce his dose by  20%.    We will make sure he gets quite a bit of IV fluid.    A day whereupon he to get him back in a couple days for a some more IV fluid.    I'll put him on a fentanyl patch. I'll also use some OxyFast for reck through pain medication.    Is half-hour with him today. I feel bad about him having a rough time. He is a tough guy.    I will see him back myself in about 2 weeks or so.   [name], MD 11/08/2013

## 2013-11-08 NOTE — Addendum Note (Signed)
Addended by: [name] on: 11/08/2013 05:38 PM   Modules accepted: Orders

## 2013-11-08 NOTE — Patient Instructions (Signed)
[facility] Discharge Instructions for Patients Receiving Chemotherapy  Today you received the following chemotherapy agents Cisplatin   To help prevent nausea and vomiting after your treatment, we encourage you to take your nausea medication Take as directed.   If you develop nausea and vomiting that is not controlled by your nausea medication, call the clinic.   BELOW ARE SYMPTOMS THAT SHOULD BE REPORTED IMMEDIATELY:  *FEVER GREATER THAN 100.5 F  *CHILLS WITH OR WITHOUT FEVER  NAUSEA AND VOMITING THAT IS NOT CONTROLLED WITH YOUR NAUSEA MEDICATION  *UNUSUAL SHORTNESS OF BREATH  *UNUSUAL BRUISING OR BLEEDING  TENDERNESS IN MOUTH AND THROAT WITH OR WITHOUT PRESENCE OF ULCERS  *URINARY PROBLEMS  *BOWEL PROBLEMS  UNUSUAL RASH Items with * indicate a potential emergency and should be followed up as soon as possible.  Feel free to call the clinic you have any questions or concerns. The clinic phone number is [phone].   Cisplatin injection What is this medicine? CISPLATIN (SIS pla tin) is a chemotherapy drug. It targets fast dividing cells, like cancer cells, and causes these cells to die. This medicine is used to treat many types of cancer like bladder, ovarian, and testicular cancers. This medicine may be used for other purposes; ask your health care provider or pharmacist if you have questions. COMMON BRAND NAME(S): Platinol -AQ, Platinol What should I tell my health care provider before I take this medicine? They need to know if you have any of these conditions: -blood disorders -hearing problems -kidney disease -recent or ongoing radiation therapy -an unusual or allergic reaction to cisplatin, carboplatin, other chemotherapy, other medicines, foods, dyes, or preservatives -pregnant or trying to get pregnant -breast-feeding How should I use this medicine? This drug is given as an infusion into a vein. It is administered in a hospital or clinic by a  specially trained health care professional. Talk to your pediatrician regarding the use of this medicine in children. Special care may be needed. Overdosage: If you think you have taken too much of this medicine contact a poison control center or emergency room at once. NOTE: This medicine is only for you. Do not share this medicine with others. What if I miss a dose? It is important not to miss a dose. Call your doctor or health care professional if you are unable to keep an appointment. What may interact with this medicine? -dofetilide -foscarnet -medicines for seizures -medicines to increase blood counts like filgrastim, pegfilgrastim, sargramostim -probenecid -pyridoxine used with altretamine -rituximab -some antibiotics like amikacin, gentamicin, neomycin, polymyxin B, streptomycin, tobramycin -sulfinpyrazone -vaccines -zalcitabine Talk to your doctor or health care professional before taking any of these medicines: -acetaminophen -aspirin -ibuprofen -ketoprofen -naproxen This list may not describe all possible interactions. Give your health care provider a list of all the medicines, herbs, non-prescription drugs, or dietary supplements you use. Also tell them if you smoke, drink alcohol, or use illegal drugs. Some items may interact with your medicine. What should I watch for while using this medicine? Your condition will be monitored carefully while you are receiving this medicine. You will need important blood work done while you are taking this medicine. This drug may make you feel generally unwell. This is not uncommon, as chemotherapy can affect healthy cells as well as cancer cells. Report any side effects. Continue your course of treatment even though you feel ill unless your doctor tells you to stop. In some cases, you may be given additional medicines to help with side effects.  Follow all directions for their use. Call your doctor or health care professional for advice if  you get a fever, chills or sore throat, or other symptoms of a cold or flu. Do not treat yourself. This drug decreases your body's ability to fight infections. Try to avoid being around people who are sick. This medicine may increase your risk to bruise or bleed. Call your doctor or health care professional if you notice any unusual bleeding. Be careful brushing and flossing your teeth or using a toothpick because you may get an infection or bleed more easily. If you have any dental work done, tell your dentist you are receiving this medicine. Avoid taking products that contain aspirin, acetaminophen, ibuprofen, naproxen, or ketoprofen unless instructed by your doctor. These medicines may hide a fever. Do not become pregnant while taking this medicine. Women should inform their doctor if they wish to become pregnant or think they might be pregnant. There is a potential for serious side effects to an unborn child. Talk to your health care professional or pharmacist for more information. Do not breast-feed an infant while taking this medicine. Drink fluids as directed while you are taking this medicine. This will help protect your kidneys. Call your doctor or health care professional if you get diarrhea. Do not treat yourself. What side effects may I notice from receiving this medicine? Side effects that you should report to your doctor or health care professional as soon as possible: -allergic reactions like skin rash, itching or hives, swelling of the face, lips, or tongue -signs of infection - fever or chills, cough, sore throat, pain or difficulty passing urine -signs of decreased platelets or bleeding - bruising, pinpoint red spots on the skin, black, tarry stools, nosebleeds -signs of decreased red blood cells - unusually weak or tired, fainting spells, lightheadedness -breathing problems -changes in hearing -gout pain -low blood counts - This drug may decrease the number of white blood cells,  red blood cells and platelets. You may be at increased risk for infections and bleeding. -nausea and vomiting -pain, swelling, redness or irritation at the injection site -pain, tingling, numbness in the hands or feet -problems with balance, movement -trouble passing urine or change in the amount of urine Side effects that usually do not require medical attention (report to your doctor or health care professional if they continue or are bothersome): -changes in vision -loss of appetite -metallic taste in the mouth or changes in taste This list may not describe all possible side effects. Call your doctor for medical advice about side effects. You may report side effects to FDA at 1-800-FDA-1088. Where should I keep my medicine? This drug is given in a hospital or clinic and will not be stored at home. NOTE: This sheet is a summary. It may not cover all possible information. If you have questions about this medicine, talk to your doctor, pharmacist, or health care provider.  2014, Elsevier/Gold Standard. (2007-11-22 14:40:54)

## 2013-11-08 NOTE — Progress Notes (Signed)
At 1300 Dr Marin Olp notified that pt as voided less than 44ml.  Dr. Marin Olp has reduced medication dosage and wants to proceed with chemotherapy.

## 2013-11-08 NOTE — Progress Notes (Signed)
Referral made to [facility] for tube feeding for patient per d.r ennever order.  To use Ensure plus 1 can  Tid with water flushes.  Patient medicaid pending.  [facility] will start patient assistance with patient

## 2013-11-08 NOTE — Progress Notes (Signed)
At 1450, pt still has not voided.  In and out cath done.  1054ml of urine obtained.  Pt states he feels much better.

## 2013-11-09 ENCOUNTER — Ambulatory Visit: Payer: Medicaid Other | Admitting: Nutrition

## 2013-11-09 ENCOUNTER — Ambulatory Visit
Admission: RE | Admit: 2013-11-09 | Discharge: 2013-11-09 | Disposition: A | Discharge status: Home / Self Care | Payer: Medicaid Other | Source: Ambulatory Visit | Attending: Radiation Oncology | Admitting: Radiation Oncology

## 2013-11-09 ENCOUNTER — Telehealth: Payer: Self-pay | Admitting: Hematology & Oncology

## 2013-11-09 NOTE — Telephone Encounter (Signed)
Pt has no insurance and his Medicare was canceled. Pt has applied for the following programs for assistance w meds and billing below:   [employer],  [employer] Patient Program,  Patient [place] (PAN), Medicaid and [employer]FA  INDIGENT APPROVED 100% FAMILY SIZE: 1 HH INC: 10,032.00 MOD POV: 11,670.00 VALID DATES: 11/09/2013 - 05/12/2014  [facility] $400  MCD and CAFA application sent to today.  Pt also has an appt to see the [facility] SW tmrw.

## 2013-11-09 NOTE — Progress Notes (Signed)
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  He is a patient of Dr. Marin Olp and Dr. Lisbeth Renshaw.  Past medical history includes hypertension, hyperlipidemia, hard of hearing, seizures, GERD, and esophageal cancer, approximately 5 years ago.  Medications include Magic mouthwash, Decadron, Ativan, Zofran, and Compazine.  Labs were reviewed.  Height: 66 inches. Weight: 133 pounds. Usual body weight 147 pounds, January 2015. BMI: 22.82.  Estimated nutrition needs: 2100-2300 calories, 110-120 g protein, 2.2 L fluid.  Patient receives chemotherapy in [facility], under Dr. Antonieta Pert care.  He is receiving daily radiation here at [facility] [place].  Patient is status post feeding tube placement in February.  He has been using a blender to blenderize all his food.  Dietary recall reveals patient consumes approximately 700 calories from pured foods.  Patient recently was told to consume Ensure Plus 3 times a day.  Patient understands how to use his feeding tube and has recently used Ensure Plus in his feeding tube with success.  Patient reports throat is beginning to get quite sore.  Patient denies other nutrition side effects.  Nutrition diagnosis: Inadequate oral intake related to diagnosis of pyriform sinus cancer and associated treatments as evidenced by 10% weight loss over 2 and half months.  Intervention: Patient was educated to continue blenderizing foods as tolerated.  He was encouraged to drink Ensure Plus and other liquids for as long as he possibly can.  I educated patient on using Osmolite 1.5 via feeding tube 4 times a day or increase Ensure Plus to 4 times a day if he tolerates oral liquids.  Otherwise, he needs to use his feeding tube.  Patient was educated to use 120 cc free water before and after each can of tube feeding.  Patient demonstrated good compliance and knowledge of feeding tube.  He was able to teach back instructions for beginning feedings.  Provided patient  with a complementary case of Osmolite 1.5 to use through his tube.  Patient should continue to drink Ensure Plus as desired.  Questions were answered.  Teach back method used.  Osmolite 1.5, 4 cans daily with 120 cc free water before and after provides 1420 calories, 59.6 g protein, 1680 mL free water.  This is approximately 60% of patient's minimum needs.  Monitoring, evaluation, goals: Patient will tolerate tube feeding along with oral diet to minimize further weight loss.  I will continue to work with patient and increase tube feeding as oral intake declines.  Next visit: Followup to be completed by phone or before radiation therapy.

## 2013-11-10 ENCOUNTER — Ambulatory Visit
Admission: RE | Admit: 2013-11-10 | Discharge: 2013-11-10 | Disposition: A | Discharge status: Home / Self Care | Payer: Medicaid Other | Source: Ambulatory Visit | Attending: Radiation Oncology | Admitting: Radiation Oncology

## 2013-11-10 ENCOUNTER — Encounter: Payer: Self-pay | Admitting: Radiation Oncology

## 2013-11-10 ENCOUNTER — Encounter: Payer: Self-pay | Admitting: *Deleted

## 2013-11-10 VITALS — BP 164/90 | HR 87 | Temp 98.1°F | Ht 64.0 in | Wt 136.9 lb

## 2013-11-10 DIAGNOSIS — C12 Malignant neoplasm of pyriform sinus: Secondary | ICD-10-CM

## 2013-11-10 MED ORDER — BIAFINE EX EMUL
CUTANEOUS | Status: DC | PRN
  Administered 2013-11-10: 19:00:00 via TOPICAL

## 2013-11-10 NOTE — Progress Notes (Signed)
Patient has decided to not do any more rad txs, okay per Dr.moody, asked patient to call Monday if not any better,push fluids over the weekend, can give him IVF"S on Monday if not any better, patient wife to call and let us know, okay per Dr.Moody, patient follow up appt is next Wednesday 11/15/13 at 1100 am to see Dr.Moody, gave another warm blanket for patient, resting well, no nausea stated, waiting on wife to come back, IVF"S ongoing  6:50 PM

## 2013-11-10 NOTE — Progress Notes (Signed)
Department of Radiation Oncology  Phone:  [phone] Fax:        [phone]  Weekly Treatment Note    Name: Collin Young Date: 11/10/2013 MRN: [id number] DOB: 1951/06/17   Current dose: 33.9 Gy  Current fraction: 16   MEDICATIONS: Current Outpatient Prescriptions  Medication Sig Dispense Refill  . Alum & Mag Hydroxide-Simeth (MAGIC MOUTHWASH W/LIDOCAINE) SOLN Take 5 mLs by mouth 4 (four) times daily as needed for mouth pain.  250 mL  1  . dexamethasone (DECADRON) 4 MG tablet Take 2 tablets by mouth once a day on the day after chemotherapy and then take 2 tablets two times a day for 2 days. Take with food.  30 tablet  1  . emollient (BIAFINE) cream Apply 1 application topically 2 (two) times daily.      . fentaNYL (DURAGESIC) 12 MCG/HR Place 1 patch (12.5 mcg total) onto the skin every 3 (three) days.  10 patch  0  . LORazepam (ATIVAN) 0.5 MG tablet Take 1 tablet (0.5 mg total) by mouth every 6 (six) hours as needed (Nausea or vomiting).  30 tablet  0  . morphine (ROXANOL) 20 MG/ML concentrated solution Take 0.5 mLs (10 mg total) by mouth every 3 (three) hours as needed for severe pain.  30 mL  0  . ondansetron (ZOFRAN) 8 MG tablet Take 1 tablet (8 mg total) by mouth 2 (two) times daily as needed. Take two times a day as needed for nausea or vomiting starting on the third day after chemotherapy.  30 tablet  1  . oxyCODONE (ROXICODONE INTENSOL) 20 MG/ML concentrated solution Take 0.5 mLs (10 mg total) by mouth every 4 (four) hours as needed for severe pain.  30 mL  0  . prochlorperazine (COMPAZINE) 10 MG tablet Take 1 tablet (10 mg total) by mouth every 6 (six) hours as needed (Nausea or vomiting).  30 tablet  1  . UNKNOWN TO PATIENT 11-08-13   Pt does not know name of medication (BP)       Current Facility-Administered Medications  Medication Dose Route Frequency Provider Last Rate Last Dose  . topical emolient (BIAFINE) emulsion   Topical PRN Marye Round, MD          ALLERGIES: Review of patient's allergies indicates no known allergies.   LABORATORY DATA:  Lab Results  Component Value Date   WBC 2.1* 11/08/2013   HGB 13.9 11/08/2013   HCT 41.9 11/08/2013   MCV 85 11/08/2013   PLT 228 11/08/2013   Lab Results  Component Value Date   NA 135 11/08/2013   K 4.0 11/08/2013   CL 95* 11/08/2013   CO2 33 11/08/2013   Lab Results  Component Value Date   ALT 25 11/08/2013   AST 23 11/08/2013   ALKPHOS 46 11/08/2013   BILITOT 0.60 11/08/2013     NARRATIVE: [name] was seen today for weekly treatment management. The chart was checked and the patient's films were reviewed. The patient complains of ongoing esophagitis. He does have some pain medication and Carafate for this. The patient is limiting his which is working fairly well. He has lost a little bit of weight and we discussed this today as well as some dietary suggestions.  PHYSICAL EXAMINATION: height is 5\' 4"  (1.626 m) and weight is 136 lb 14.4 oz (62.097 kg). His temperature is 98.1 F (36.7 C). His blood pressure is 164/90 and his pulse is 87. His oxygen saturation is 99%.  the patient has a couple of white areas in the oropharynx on the left. This doesn't really look like thrush. We will keep an eye on this and the patient is to let us know if his throat worsens in terms of pain.  ASSESSMENT: The patient is doing satisfactorily with treatment.  PLAN: We will continue with the patient's radiation treatment as planned.

## 2013-11-10 NOTE — Progress Notes (Signed)
[facility] Clinical Social Work  Clinical Social Work met with Pt at length to assess for needs and attempt to address his questions about medicaid. Pt has applied and his MCD appears to be pending. Pt has car for transportation and is open to assistance as needed. He appears to possibly have some issues with vision or possibly literacy and may need help understanding paperwork or medication directions. Meredith Hobgood also connected with Pt and we will both follow and assist pt. Pt was made aware to please bring in any paperwork that he receives from DSS to assist him in understanding if needed.    Clinical Social Work interventions: Resource education Emotional support Problem solving  Collin Hock, LCSW Clinical Social Worker [facility] Wednesday, Thursday and Friday Phone: [phone] Fax: [phone]   

## 2013-11-10 NOTE — Progress Notes (Signed)
[name] has received 16 fractions.  He reports sore throat and increasing pain upon swallowing.  Eating soft foods which he blends it in a Bullet.  He is has lost 6 lbs since 11/03/13.  Discussed diet and adding ensure to diet, 4 times daily and adding cheese to food. Given 3 cases of Ensure by the [facility] dietician.  Inspected his mouth and note a whitish area at the back of his throat on his left.  Note hyperpigmentation of his neck with dry skin.  Given more Biafine.

## 2013-11-10 NOTE — Progress Notes (Signed)
Spoke with patient while with Loren Racer CSW.  He stated he has tried to get he Medicaid reinstated.  Per [employer] Tracks his Medicaid is active from 08/31/13-Current.  I also called patient accounting, spoke with Deanna and she transferred me to [facility].  She stated she will re-bill all accounts to Medicaid for this patient.

## 2013-11-13 ENCOUNTER — Ambulatory Visit: Payer: Medicaid Other

## 2013-11-13 ENCOUNTER — Ambulatory Visit: Payer: Self-pay

## 2013-11-13 ENCOUNTER — Ambulatory Visit
Admission: RE | Admit: 2013-11-13 | Discharge: 2013-11-13 | Disposition: A | Discharge status: Home / Self Care | Payer: Medicaid Other | Source: Ambulatory Visit | Attending: Radiation Oncology | Admitting: Radiation Oncology

## 2013-11-14 ENCOUNTER — Ambulatory Visit
Admission: RE | Admit: 2013-11-14 | Discharge: 2013-11-14 | Disposition: A | Discharge status: Home / Self Care | Payer: Medicaid Other | Source: Ambulatory Visit | Attending: Radiation Oncology | Admitting: Radiation Oncology

## 2013-11-15 ENCOUNTER — Ambulatory Visit
Admission: RE | Admit: 2013-11-15 | Discharge: 2013-11-15 | Disposition: A | Discharge status: Home / Self Care | Payer: Medicaid Other | Source: Ambulatory Visit | Attending: Radiation Oncology | Admitting: Radiation Oncology

## 2013-11-15 ENCOUNTER — Telehealth: Payer: Self-pay | Admitting: Hematology & Oncology

## 2013-11-15 ENCOUNTER — Encounter: Payer: Self-pay | Admitting: Radiation Oncology

## 2013-11-15 ENCOUNTER — Telehealth: Payer: Self-pay | Admitting: *Deleted

## 2013-11-15 VITALS — BP 182/94 | HR 96 | Temp 98.8°F | Ht 64.0 in | Wt 129.2 lb

## 2013-11-15 DIAGNOSIS — C12 Malignant neoplasm of pyriform sinus: Secondary | ICD-10-CM

## 2013-11-15 NOTE — Telephone Encounter (Signed)
daughter called pt is having trouble talking is not having trouble breathing she wants him to be seen sooner.. I sent them to triage nurse. I also talked with Letta Median at [employer] pt is having radiation today. Nikki RN aware also

## 2013-11-15 NOTE — Telephone Encounter (Signed)
Called patient home asked how he was doing and if he was coming for treatment today?, "Yes, patient whispering hoarseness, sore throat, asked if he wanted to see MD after treatment today? "Yes, my daughter wants to know what's going on", could hardley hear patient, will have patient be seen by MD after treatment today at 5pm, called Faith on linac #4, infomred patient will be coming in today for treatment and MD after to be seen 2:09 PM

## 2013-11-15 NOTE — Progress Notes (Signed)
Previous note from 11/10/13 from [facility] RN note,  erroneous wrong patient, is still getting radiation 2:01 PM

## 2013-11-15 NOTE — Progress Notes (Signed)
Department of Radiation Oncology  Phone:  [phone] Fax:        [phone]  Weekly Treatment Note    Name: Collin Young Date: 11/15/2013 MRN: [id number] DOB: 1950-09-09   Current dose: 38.2 Gy  Current fraction: 18   MEDICATIONS: Current Outpatient Prescriptions  Medication Sig Dispense Refill  . Alum & Mag Hydroxide-Simeth (MAGIC MOUTHWASH W/LIDOCAINE) SOLN Take 5 mLs by mouth 4 (four) times daily as needed for mouth pain.  250 mL  1  . fentaNYL (DURAGESIC) 12 MCG/HR Place 1 patch (12.5 mcg total) onto the skin every 3 (three) days.  10 patch  0  . LORazepam (ATIVAN) 0.5 MG tablet Take 1 tablet (0.5 mg total) by mouth every 6 (six) hours as needed (Nausea or vomiting).  30 tablet  0  . morphine (ROXANOL) 20 MG/ML concentrated solution Take 0.5 mLs (10 mg total) by mouth every 3 (three) hours as needed for severe pain.  30 mL  0  . ondansetron (ZOFRAN) 8 MG tablet Take 1 tablet (8 mg total) by mouth 2 (two) times daily as needed. Take two times a day as needed for nausea or vomiting starting on the third day after chemotherapy.  30 tablet  1  . prochlorperazine (COMPAZINE) 10 MG tablet Take 1 tablet (10 mg total) by mouth every 6 (six) hours as needed (Nausea or vomiting).  30 tablet  1  . UNKNOWN TO PATIENT 11-08-13   Pt does not know name of medication (BP)      . dexamethasone (DECADRON) 4 MG tablet Take 2 tablets by mouth once a day on the day after chemotherapy and then take 2 tablets two times a day for 2 days. Take with food.  30 tablet  1  . emollient (BIAFINE) cream Apply 1 application topically 2 (two) times daily.      Marland Kitchen oxyCODONE (ROXICODONE INTENSOL) 20 MG/ML concentrated solution Take 0.5 mLs (10 mg total) by mouth every 4 (four) hours as needed for severe pain.  30 mL  0   No current facility-administered medications for this encounter.     ALLERGIES: Review of patient's allergies indicates no known allergies.   LABORATORY DATA:  Lab Results    Component Value Date   WBC 2.1* 11/08/2013   HGB 13.9 11/08/2013   HCT 41.9 11/08/2013   MCV 85 11/08/2013   PLT 228 11/08/2013   Lab Results  Component Value Date   NA 135 11/08/2013   K 4.0 11/08/2013   CL 95* 11/08/2013   CO2 33 11/08/2013   Lab Results  Component Value Date   ALT 25 11/08/2013   AST 23 11/08/2013   ALKPHOS 46 11/08/2013   BILITOT 0.60 11/08/2013     NARRATIVE: Collin Young was seen today for weekly treatment management. The chart was checked and the patient's films were reviewed. The patient is having some increased hoarseness. I spoke with the patient and his family today regarding this. The patient also is having some extend saliva. He will begin a trial of Mucinex which she has at home.   PHYSICAL EXAMINATION: height is 5\' 4"  (1.626 m) and weight is 129 lb 3.2 oz (58.605 kg). His temperature is 98.8 F (37.1 C). His blood pressure is 182/94 and his pulse is 96.        ASSESSMENT: The patient is doing satisfactorily with treatment.  PLAN: We will continue with the patient's radiation treatment as planned. The patient will increase his tube feeds as he  has been losing some weight.

## 2013-11-15 NOTE — Progress Notes (Signed)
Collin Young has received 18 fractions to his larynx.  His voice is hoarse and this was explained as a reaction to the radiation therapy to his larynx.  He does report pain upon swallowing and reports that he has "a liitle pain" presently.  He has lost ~ 7 lbs since 11/10/13.  He is instilling 3-4 cans of Osmolite vis peg tube daily, in addition to, blending foods to eat.  Thickened saliva noted with frequent expectoration.

## 2013-11-16 ENCOUNTER — Ambulatory Visit
Admission: RE | Admit: 2013-11-16 | Discharge: 2013-11-16 | Disposition: A | Discharge status: Home / Self Care | Payer: Medicaid Other | Source: Ambulatory Visit | Attending: Radiation Oncology | Admitting: Radiation Oncology

## 2013-11-16 ENCOUNTER — Other Ambulatory Visit: Payer: Self-pay | Admitting: *Deleted

## 2013-11-16 DIAGNOSIS — C12 Malignant neoplasm of pyriform sinus: Secondary | ICD-10-CM

## 2013-11-16 MED ORDER — MORPHINE SULFATE (CONCENTRATE) 20 MG/ML PO SOLN: 10.0000 mg | ORAL | Status: DC | PRN

## 2013-11-17 ENCOUNTER — Ambulatory Visit
Admission: RE | Admit: 2013-11-17 | Discharge: 2013-11-17 | Disposition: A | Discharge status: Home / Self Care | Payer: Medicaid Other | Source: Ambulatory Visit | Attending: Radiation Oncology | Admitting: Radiation Oncology

## 2013-11-17 ENCOUNTER — Encounter: Payer: Self-pay | Admitting: Radiation Oncology

## 2013-11-17 VITALS — BP 152/79 | HR 94 | Temp 98.2°F | Resp 20 | Wt 131.7 lb

## 2013-11-17 DIAGNOSIS — C12 Malignant neoplasm of pyriform sinus: Secondary | ICD-10-CM

## 2013-11-17 NOTE — Progress Notes (Signed)
Department of Radiation Oncology  Phone:  [phone] Fax:        [phone]  Weekly Treatment Note    Name: Collin Young Date: 11/17/2013 MRN: [id number] DOB: Nov 26, 1950   Current dose: 44.5 Gy  Current fraction: 21   MEDICATIONS: Current Outpatient Prescriptions  Medication Sig Dispense Refill  . Alum & Mag Hydroxide-Simeth (MAGIC MOUTHWASH W/LIDOCAINE) SOLN Take 5 mLs by mouth 4 (four) times daily as needed for mouth pain.  250 mL  1  . dexamethasone (DECADRON) 4 MG tablet Take 2 tablets by mouth once a day on the day after chemotherapy and then take 2 tablets two times a day for 2 days. Take with food.  30 tablet  1  . emollient (BIAFINE) cream Apply 1 application topically 2 (two) times daily.      . fentaNYL (DURAGESIC) 12 MCG/HR Place 1 patch (12.5 mcg total) onto the skin every 3 (three) days.  10 patch  0  . LORazepam (ATIVAN) 0.5 MG tablet Take 1 tablet (0.5 mg total) by mouth every 6 (six) hours as needed (Nausea or vomiting).  30 tablet  0  . morphine (ROXANOL) 20 MG/ML concentrated solution Take 0.5 mLs (10 mg total) by mouth every 3 (three) hours as needed for severe pain.  30 mL  0  . ondansetron (ZOFRAN) 8 MG tablet Take 1 tablet (8 mg total) by mouth 2 (two) times daily as needed. Take two times a day as needed for nausea or vomiting starting on the third day after chemotherapy.  30 tablet  1  . oxyCODONE (ROXICODONE INTENSOL) 20 MG/ML concentrated solution Take 0.5 mLs (10 mg total) by mouth every 4 (four) hours as needed for severe pain.  30 mL  0  . prochlorperazine (COMPAZINE) 10 MG tablet Take 1 tablet (10 mg total) by mouth every 6 (six) hours as needed (Nausea or vomiting).  30 tablet  1  . UNKNOWN TO PATIENT 11-08-13   Pt does not know name of medication (BP)       No current facility-administered medications for this encounter.     ALLERGIES: Review of patient's allergies indicates no known allergies.   LABORATORY DATA:  Lab Results    Component Value Date   WBC 2.1* 11/08/2013   HGB 13.9 11/08/2013   HCT 41.9 11/08/2013   MCV 85 11/08/2013   PLT 228 11/08/2013   Lab Results  Component Value Date   NA 135 11/08/2013   K 4.0 11/08/2013   CL 95* 11/08/2013   CO2 33 11/08/2013   Lab Results  Component Value Date   ALT 25 11/08/2013   AST 23 11/08/2013   ALKPHOS 46 11/08/2013   BILITOT 0.60 11/08/2013     NARRATIVE: Collin Young was seen today for weekly treatment management. The chart was checked and the patient's films were reviewed. The patient is doing relatively well the last couple of days. No new complaints. He has been able to eat a little more since her talk 2 days ago on Wednesday. He does take pain medication which appears to control his pain relatively well time. The patient is in good spirits today.  PHYSICAL EXAMINATION: weight is 131 lb 11.2 oz (59.739 kg). His oral temperature is 98.2 F (36.8 C). His blood pressure is 152/79 and his pulse is 94. His respiration is 20.      some dry desquamation in the neck.  ASSESSMENT: The patient is doing satisfactorily with treatment.  PLAN: We will continue  with the patient's radiation treatment as planned.

## 2013-11-17 NOTE — Progress Notes (Signed)
Weekly rad txs larynx,  21 tx completd, hoarseness, loose non productive  congestive cough, no c/o nausea, purees his food and drinks through a straw, has gained 2 lbs since last week, c/o sore throat at times takes pain med and resolves,  No dizziness, energy is okay states patient 2:45 PM

## 2013-11-20 ENCOUNTER — Ambulatory Visit: Payer: Medicaid Other

## 2013-11-20 ENCOUNTER — Ambulatory Visit: Payer: Self-pay

## 2013-11-20 ENCOUNTER — Ambulatory Visit
Admission: RE | Admit: 2013-11-20 | Discharge: 2013-11-20 | Disposition: A | Discharge status: Home / Self Care | Payer: Medicaid Other | Source: Ambulatory Visit | Attending: Radiation Oncology | Admitting: Radiation Oncology

## 2013-11-21 ENCOUNTER — Ambulatory Visit
Admission: RE | Admit: 2013-11-21 | Discharge: 2013-11-21 | Disposition: A | Discharge status: Home / Self Care | Payer: Medicaid Other | Source: Ambulatory Visit | Attending: Radiation Oncology | Admitting: Radiation Oncology

## 2013-11-22 ENCOUNTER — Telehealth: Payer: Self-pay | Admitting: Hematology & Oncology

## 2013-11-22 ENCOUNTER — Ambulatory Visit
Admission: RE | Admit: 2013-11-22 | Discharge: 2013-11-22 | Disposition: A | Discharge status: Home / Self Care | Payer: Medicaid Other | Source: Ambulatory Visit | Attending: Radiation Oncology | Admitting: Radiation Oncology

## 2013-11-22 NOTE — Telephone Encounter (Signed)
Pt moved 3-26 to 3-27 ok to double book per RN

## 2013-11-23 ENCOUNTER — Ambulatory Visit
Admission: RE | Admit: 2013-11-23 | Discharge: 2013-11-23 | Disposition: A | Discharge status: Home / Self Care | Payer: Medicaid Other | Source: Ambulatory Visit | Attending: Radiation Oncology | Admitting: Radiation Oncology

## 2013-11-23 ENCOUNTER — Other Ambulatory Visit: Payer: Self-pay | Admitting: Lab

## 2013-11-23 ENCOUNTER — Ambulatory Visit: Payer: Self-pay | Admitting: Hematology & Oncology

## 2013-11-23 ENCOUNTER — Telehealth: Payer: Self-pay | Admitting: Hematology & Oncology

## 2013-11-23 ENCOUNTER — Ambulatory Visit: Payer: Self-pay

## 2013-11-23 NOTE — Telephone Encounter (Signed)
Pt cx 3-26 transferred to RN for triage

## 2013-11-24 ENCOUNTER — Encounter: Payer: Self-pay | Admitting: Radiation Oncology

## 2013-11-24 ENCOUNTER — Ambulatory Visit: Payer: Self-pay | Admitting: Hematology & Oncology

## 2013-11-24 ENCOUNTER — Other Ambulatory Visit: Payer: Self-pay | Admitting: Lab

## 2013-11-24 ENCOUNTER — Ambulatory Visit
Admission: RE | Admit: 2013-11-24 | Discharge: 2013-11-24 | Disposition: A | Discharge status: Home / Self Care | Payer: Medicaid Other | Source: Ambulatory Visit | Attending: Radiation Oncology | Admitting: Radiation Oncology

## 2013-11-24 ENCOUNTER — Ambulatory Visit: Payer: Self-pay

## 2013-11-24 VITALS — BP 155/79 | HR 100 | Temp 99.9°F | Resp 20 | Wt 127.5 lb

## 2013-11-24 DIAGNOSIS — C12 Malignant neoplasm of pyriform sinus: Secondary | ICD-10-CM

## 2013-11-24 MED ORDER — SCOPOLAMINE 1 MG/3DAYS TD PT72: 1.0000 | MEDICATED_PATCH | TRANSDERMAL | Status: DC

## 2013-11-24 NOTE — Progress Notes (Addendum)
Weekly rad txs,  Larynx,  Dry desqamation on neck, peeling, using biafin cream bid, loose gurgling phelgm,coughed up white ,thicened saliva, loss 4 lbs since last week, drinks 4 ensure daily,purees his foods, has a temp today 99.9, di d say he had chills and sweats last night, drinks swater, sitting b/p=153/101,P=98,RR=20,99% room air sats, c/o pain only when swaloowing Standing b/p=155/79,P=100, no c/o sob or nausea 5:05 PM

## 2013-11-24 NOTE — Progress Notes (Signed)
Department of Radiation Oncology  Phone:  [phone] Fax:        [phone]  Weekly Treatment Note    Name: Collin Young Date: 11/24/2013 MRN: [id number] DOB: Jun 23, 1951   Current dose: 55.1 Gy  Current fraction: 26   MEDICATIONS: Current Outpatient Prescriptions  Medication Sig Dispense Refill  . Alum & Mag Hydroxide-Simeth (MAGIC MOUTHWASH W/LIDOCAINE) SOLN Take 5 mLs by mouth 4 (four) times daily as needed for mouth pain.  250 mL  1  . dexamethasone (DECADRON) 4 MG tablet Take 2 tablets by mouth once a day on the day after chemotherapy and then take 2 tablets two times a day for 2 days. Take with food.  30 tablet  1  . emollient (BIAFINE) cream Apply 1 application topically 2 (two) times daily.      . fentaNYL (DURAGESIC) 12 MCG/HR Place 1 patch (12.5 mcg total) onto the skin every 3 (three) days.  10 patch  0  . LORazepam (ATIVAN) 0.5 MG tablet Take 1 tablet (0.5 mg total) by mouth every 6 (six) hours as needed (Nausea or vomiting).  30 tablet  0  . morphine (ROXANOL) 20 MG/ML concentrated solution Take 0.5 mLs (10 mg total) by mouth every 3 (three) hours as needed for severe pain.  30 mL  0  . ondansetron (ZOFRAN) 8 MG tablet Take 1 tablet (8 mg total) by mouth 2 (two) times daily as needed. Take two times a day as needed for nausea or vomiting starting on the third day after chemotherapy.  30 tablet  1  . prochlorperazine (COMPAZINE) 10 MG tablet Take 1 tablet (10 mg total) by mouth every 6 (six) hours as needed (Nausea or vomiting).  30 tablet  1  . UNKNOWN TO PATIENT 11-08-13   Pt does not know name of medication (BP)       No current facility-administered medications for this encounter.     ALLERGIES: Review of patient's allergies indicates no known allergies.   LABORATORY DATA:  Lab Results  Component Value Date   WBC 2.1* 11/08/2013   HGB 13.9 11/08/2013   HCT 41.9 11/08/2013   MCV 85 11/08/2013   PLT 228 11/08/2013   Lab Results  Component Value Date     NA 135 11/08/2013   K 4.0 11/08/2013   CL 95* 11/08/2013   CO2 33 11/08/2013   Lab Results  Component Value Date   ALT 25 11/08/2013   AST 23 11/08/2013   ALKPHOS 46 11/08/2013   BILITOT 0.60 11/08/2013     NARRATIVE: Collin Young was seen today for weekly treatment management. The chart was checked and the patient's films were reviewed. The patient is more run down today. More fatigue. Increased irritation within the neck area. He is coughing up a lot of phlegm/saliva which is pooling. The patient has lost some additional weight.  PHYSICAL EXAMINATION: weight is 127 lb 8 oz (57.834 kg). His oral temperature is 99.9 F (37.7 C). His blood pressure is 155/79 and his pulse is 100. His respiration is 20 and oxygen saturation is 99%.      increased dry desquamation in the neck bilaterally with hyperpigmentation. Increasing mucositis in the oropharynx.  ASSESSMENT: The patient is having some expected issues secondary to radiation side effects.  Increased sore throat and the patient has lost some weight.  PLAN: We will continue with the patient's radiation treatment as planned. The patient will increase his dose of Duragesic and continue to take morphine when  necessary for pain. We discussed increasing his nutritional intake with the help of additional pain medication and the patient understands the importance of this. The patient also has been given a prescription for scopolamine patch to help with secretions. I do not believe that adjustment of his plan is necessary at this time given his weight loss and how many fractions are remaining. We have made some changes today to call or decrease the case of weight loss.

## 2013-11-27 ENCOUNTER — Ambulatory Visit
Admission: RE | Admit: 2013-11-27 | Discharge: 2013-11-27 | Disposition: A | Discharge status: Home / Self Care | Payer: Medicaid Other | Source: Ambulatory Visit | Attending: Radiation Oncology | Admitting: Radiation Oncology

## 2013-11-27 ENCOUNTER — Encounter: Payer: Self-pay | Admitting: *Deleted

## 2013-11-27 ENCOUNTER — Ambulatory Visit: Payer: Self-pay

## 2013-11-27 ENCOUNTER — Ambulatory Visit: Payer: Medicaid Other | Attending: Radiation Oncology

## 2013-11-27 DIAGNOSIS — R1310 Dysphagia, unspecified: Secondary | ICD-10-CM | POA: Insufficient documentation

## 2013-11-27 NOTE — Progress Notes (Signed)
Spoke with patient in [facility] lobby prior to his scheduled RT and appt with Garald Balding, Speech Therapist.  Patient audibly gurgly.  He stated he had rec'd Rx for Scopolamine from Dr. Lisbeth Renshaw this past Friday but his pharmacy was unable to fill it immediately.  He indicated he was going to check back with them today (I later verified with his pharmacy that he did pick of the Rx.)  After the patient's appt, I had a visit from Henning who expressed his concern for the same symptom.   He stated that patient indicated he had experienced an episode of night sweats recently.   I concur with his expressed recommendation for MD evaluation/monitoring and have routed this note to Dr. Sondra Come for follow-up.  Gayleen Orem, RN, BSN, [facility] Navigator [phone]

## 2013-11-28 ENCOUNTER — Ambulatory Visit
Admission: RE | Admit: 2013-11-28 | Discharge: 2013-11-28 | Disposition: A | Discharge status: Home / Self Care | Payer: Medicaid Other | Source: Ambulatory Visit | Attending: Radiation Oncology | Admitting: Radiation Oncology

## 2013-11-28 ENCOUNTER — Ambulatory Visit: Payer: Medicaid Other | Admitting: Nutrition

## 2013-11-28 NOTE — Progress Notes (Signed)
I called patient on the telephone.  Patient reports he continues to use blenderized foods and drinks some Ensure Plus.  He states he is using Osmolite 1.5 or Ensure Plus 4 times a day through his feeding tube.  Patient denies difficulties with tube feeding tolerance.  Weight has decreased to 127 pounds from 133 pounds.  Estimated nutrition needs: 2100-2300 calories, 110-120 g protein, 2.2 L fluid.  Nutrition diagnosis: Inadequate oral intake continues.  Intervention: Patient educated to continue blenderize foods as tolerated and allowed by physician.  Patient was educated to increase Osmolite 1.5 via feeding tube to 6 cans daily.  Patient can use 1-1/2 cans 4 times a day or if tolerated 2 cans Osmolite 1.5, 3 times a day.  He should continue to flush feeding tube with 120 cc of water before and after bolus feedings.  Patient able to teach back instructions on increasing feedings.  Questions were answered.  Provided Osmolite 1.5 for patient to pick up today.  Osmolite 1.5, 6 cans daily with 120 cc free water before and after bolus feeds 4 times a day provides 2130 calories, 89 g protein, and 2046 mL free water.  Monitoring, evaluation, goals: Patient will tolerate tube feeding increase to minimize further weight loss.  Next visit: I will continue to followup with patient as needed.

## 2013-11-29 ENCOUNTER — Other Ambulatory Visit (HOSPITAL_BASED_OUTPATIENT_CLINIC_OR_DEPARTMENT_OTHER): Payer: Medicaid Other | Admitting: Lab

## 2013-11-29 ENCOUNTER — Ambulatory Visit
Admission: RE | Admit: 2013-11-29 | Discharge: 2013-11-29 | Disposition: A | Discharge status: Home / Self Care | Payer: Medicaid Other | Source: Ambulatory Visit | Attending: Radiation Oncology | Admitting: Radiation Oncology

## 2013-11-29 ENCOUNTER — Ambulatory Visit (HOSPITAL_BASED_OUTPATIENT_CLINIC_OR_DEPARTMENT_OTHER): Payer: Medicaid Other

## 2013-11-29 ENCOUNTER — Ambulatory Visit: Payer: Medicaid Other | Admitting: Radiation Oncology

## 2013-11-29 ENCOUNTER — Ambulatory Visit (HOSPITAL_BASED_OUTPATIENT_CLINIC_OR_DEPARTMENT_OTHER): Payer: Medicaid Other | Admitting: Hematology & Oncology

## 2013-11-29 VITALS — BP 139/11 | HR 80 | Temp 98.0°F | Resp 20 | Wt 127.2 lb

## 2013-11-29 DIAGNOSIS — R1310 Dysphagia, unspecified: Secondary | ICD-10-CM

## 2013-11-29 DIAGNOSIS — D490 Neoplasm of unspecified behavior of digestive system: Secondary | ICD-10-CM

## 2013-11-29 DIAGNOSIS — Z8501 Personal history of malignant neoplasm of esophagus: Secondary | ICD-10-CM

## 2013-11-29 DIAGNOSIS — Z5111 Encounter for antineoplastic chemotherapy: Secondary | ICD-10-CM

## 2013-11-29 DIAGNOSIS — C12 Malignant neoplasm of pyriform sinus: Secondary | ICD-10-CM

## 2013-11-29 LAB — CBC WITH DIFFERENTIAL (CANCER CENTER ONLY)
BASO#: 0 10*3/uL (ref 0.0–0.2)
BASO%: 0.4 % (ref 0.0–2.0)
EOS%: 4.9 % (ref 0.0–7.0)
Eosinophils Absolute: 0.1 10*3/uL (ref 0.0–0.5)
HEMATOCRIT: 37.7 % — AB (ref 38.7–49.9)
HEMOGLOBIN: 12.7 g/dL — AB (ref 13.0–17.1)
LYMPH#: 0.2 10*3/uL — ABNORMAL LOW (ref 0.9–3.3)
LYMPH%: 8.7 % — ABNORMAL LOW (ref 14.0–48.0)
MCH: 29 pg (ref 28.0–33.4)
MCHC: 33.7 g/dL (ref 32.0–35.9)
MCV: 86 fL (ref 82–98)
MONO#: 0.4 10*3/uL (ref 0.1–0.9)
MONO%: 14.8 % — ABNORMAL HIGH (ref 0.0–13.0)
NEUT#: 1.9 10*3/uL (ref 1.5–6.5)
NEUT%: 71.2 % (ref 40.0–80.0)
Platelets: 160 10*3/uL (ref 145–400)
RBC: 4.38 10*6/uL (ref 4.20–5.70)
RDW: 13.1 % (ref 11.1–15.7)
WBC: 2.6 10*3/uL — AB (ref 4.0–10.0)

## 2013-11-29 LAB — CMP (CANCER CENTER ONLY)
ALT(SGPT): 22 U/L (ref 10–47)
AST: 29 U/L (ref 11–38)
Albumin: 2.8 g/dL — ABNORMAL LOW (ref 3.3–5.5)
Alkaline Phosphatase: 48 U/L (ref 26–84)
BILIRUBIN TOTAL: 0.7 mg/dL (ref 0.20–1.60)
BUN: 17 mg/dL (ref 7–22)
CALCIUM: 9.2 mg/dL (ref 8.0–10.3)
CHLORIDE: 86 meq/L — AB (ref 98–108)
CO2: 36 meq/L — AB (ref 18–33)
CREATININE: 0.8 mg/dL (ref 0.6–1.2)
Glucose, Bld: 188 mg/dL — ABNORMAL HIGH (ref 73–118)
Potassium: 4.5 mEq/L (ref 3.3–4.7)
Sodium: 133 mEq/L (ref 128–145)
Total Protein: 6.7 g/dL (ref 6.4–8.1)

## 2013-11-29 MED ORDER — PALONOSETRON HCL INJECTION 0.25 MG/5ML
INTRAVENOUS | Status: AC
  Filled 2013-11-29: qty 5

## 2013-11-29 MED ORDER — MORPHINE SULFATE (CONCENTRATE) 20 MG/ML PO SOLN: 10.0000 mg | ORAL | Status: DC | PRN

## 2013-11-29 MED ORDER — POTASSIUM CHLORIDE 2 MEQ/ML IV SOLN
Freq: Once | INTRAVENOUS | Status: AC
  Administered 2013-11-29: 10:00:00 via INTRAVENOUS
  Filled 2013-11-29: qty 10

## 2013-11-29 MED ORDER — HEPARIN SOD (PORK) LOCK FLUSH 100 UNIT/ML IV SOLN
500.0000 [IU] | Freq: Once | INTRAVENOUS | Status: DC | PRN
  Filled 2013-11-29: qty 5

## 2013-11-29 MED ORDER — DEXAMETHASONE SODIUM PHOSPHATE 20 MG/5ML IJ SOLN
INTRAMUSCULAR | Status: AC
Start: 2013-11-29 — End: 2013-11-29
  Filled 2013-11-29: qty 5

## 2013-11-29 MED ORDER — FENTANYL 12 MCG/HR TD PT72: 12.5000 ug | MEDICATED_PATCH | TRANSDERMAL | Status: DC

## 2013-11-29 MED ORDER — SODIUM CHLORIDE 0.9 % IV SOLN
Freq: Once | INTRAVENOUS | Status: AC
  Administered 2013-11-29: 09:00:00 via INTRAVENOUS

## 2013-11-29 MED ORDER — SODIUM CHLORIDE 0.9 % IV SOLN
150.0000 mg | Freq: Once | INTRAVENOUS | Status: AC
  Administered 2013-11-29: 150 mg via INTRAVENOUS
  Filled 2013-11-29: qty 5

## 2013-11-29 MED ORDER — DEXAMETHASONE SODIUM PHOSPHATE 20 MG/5ML IJ SOLN
12.0000 mg | Freq: Once | INTRAMUSCULAR | Status: AC
  Administered 2013-11-29: 12 mg via INTRAVENOUS

## 2013-11-29 MED ORDER — PALONOSETRON HCL INJECTION 0.25 MG/5ML
0.2500 mg | Freq: Once | INTRAVENOUS | Status: AC
  Administered 2013-11-29: 0.25 mg via INTRAVENOUS

## 2013-11-29 MED ORDER — SODIUM CHLORIDE 0.9 % IV SOLN
58.0000 mg/m2 | Freq: Once | INTRAVENOUS | Status: AC
  Administered 2013-11-29: 100 mg via INTRAVENOUS
  Filled 2013-11-29: qty 100

## 2013-11-29 MED ORDER — SODIUM CHLORIDE 0.9 % IJ SOLN
10.0000 mL | INTRAMUSCULAR | Status: DC | PRN
  Filled 2013-11-29: qty 10

## 2013-11-29 NOTE — Patient Instructions (Addendum)
Cisplatin injection What is this medicine? CISPLATIN (SIS pla tin) is a chemotherapy drug. It targets fast dividing cells, like cancer cells, and causes these cells to die. This medicine is used to treat many types of cancer like bladder, ovarian, and testicular cancers. This medicine may be used for other purposes; ask your health care provider or pharmacist if you have questions. COMMON BRAND NAME(S): Platinol -AQ, Platinol What should I tell my health care provider before I take this medicine? They need to know if you have any of these conditions: -blood disorders -hearing problems -kidney disease -recent or ongoing radiation therapy -an unusual or allergic reaction to cisplatin, carboplatin, other chemotherapy, other medicines, foods, dyes, or preservatives -pregnant or trying to get pregnant -breast-feeding How should I use this medicine? This drug is given as an infusion into a vein. It is administered in a hospital or clinic by a specially trained health care professional. Talk to your pediatrician regarding the use of this medicine in children. Special care may be needed. Overdosage: If you think you have taken too much of this medicine contact a poison control center or emergency room at once. NOTE: This medicine is only for you. Do not share this medicine with others. What if I miss a dose? It is important not to miss a dose. Call your doctor or health care professional if you are unable to keep an appointment. What may interact with this medicine? -dofetilide -foscarnet -medicines for seizures -medicines to increase blood counts like filgrastim, pegfilgrastim, sargramostim -probenecid -pyridoxine used with altretamine -rituximab -some antibiotics like amikacin, gentamicin, neomycin, polymyxin B, streptomycin, tobramycin -sulfinpyrazone -vaccines -zalcitabine Talk to your doctor or health care professional before taking any of these  medicines: -acetaminophen -aspirin -ibuprofen -ketoprofen -naproxen This list may not describe all possible interactions. Give your health care provider a list of all the medicines, herbs, non-prescription drugs, or dietary supplements you use. Also tell them if you smoke, drink alcohol, or use illegal drugs. Some items may interact with your medicine. What should I watch for while using this medicine? Your condition will be monitored carefully while you are receiving this medicine. You will need important blood work done while you are taking this medicine. This drug may make you feel generally unwell. This is not uncommon, as chemotherapy can affect healthy cells as well as cancer cells. Report any side effects. Continue your course of treatment even though you feel ill unless your doctor tells you to stop. In some cases, you may be given additional medicines to help with side effects. Follow all directions for their use. Call your doctor or health care professional for advice if you get a fever, chills or sore throat, or other symptoms of a cold or flu. Do not treat yourself. This drug decreases your body's ability to fight infections. Try to avoid being around people who are sick. This medicine may increase your risk to bruise or bleed. Call your doctor or health care professional if you notice any unusual bleeding. Be careful brushing and flossing your teeth or using a toothpick because you may get an infection or bleed more easily. If you have any dental work done, tell your dentist you are receiving this medicine. Avoid taking products that contain aspirin, acetaminophen, ibuprofen, naproxen, or ketoprofen unless instructed by your doctor. These medicines may hide a fever. Do not become pregnant while taking this medicine. Women should inform their doctor if they wish to become pregnant or think they might be pregnant. There is a   potential for serious side effects to an unborn child. Talk to  your health care professional or pharmacist for more information. Do not breast-feed an infant while taking this medicine. Drink fluids as directed while you are taking this medicine. This will help protect your kidneys. Call your doctor or health care professional if you get diarrhea. Do not treat yourself. What side effects may I notice from receiving this medicine? Side effects that you should report to your doctor or health care professional as soon as possible: -allergic reactions like skin rash, itching or hives, swelling of the face, lips, or tongue -signs of infection - fever or chills, cough, sore throat, pain or difficulty passing urine -signs of decreased platelets or bleeding - bruising, pinpoint red spots on the skin, black, tarry stools, nosebleeds -signs of decreased red blood cells - unusually weak or tired, fainting spells, lightheadedness -breathing problems -changes in hearing -gout pain -low blood counts - This drug may decrease the number of white blood cells, red blood cells and platelets. You may be at increased risk for infections and bleeding. -nausea and vomiting -pain, swelling, redness or irritation at the injection site -pain, tingling, numbness in the hands or feet -problems with balance, movement -trouble passing urine or change in the amount of urine Side effects that usually do not require medical attention (report to your doctor or health care professional if they continue or are bothersome): -changes in vision -loss of appetite -metallic taste in the mouth or changes in taste This list may not describe all possible side effects. Call your doctor for medical advice about side effects. You may report side effects to FDA at 1-800-FDA-1088. Where should I keep my medicine? This drug is given in a hospital or clinic and will not be stored at home. NOTE: This sheet is a summary. It may not cover all possible information. If you have questions about this medicine,  talk to your doctor, pharmacist, or health care provider.  2014, [employer]. (2007-11-22 14:40:54) Constipation, Adult Constipation is when a person:  Poops (bowel movement) less than 3 times a week.  Has a hard time pooping.  Has poop that is dry, hard, or bigger than normal. HOME CARE   Eat more fiber, such as fruits, vegetables, whole grains like brown rice, and beans.  Eat less fatty foods and sugar. This includes [place] fries, hamburgers, cookies, candy, and soda.  If you are not getting enough fiber from food, take products with added fiber in them (supplements).  Drink enough fluid to keep your pee (urine) clear or pale yellow.  Go to the restroom when you feel like you need to poop. Do not hold it.  Only take medicine as told by your doctor. Do not take medicines that help you poop (laxatives) without talking to your doctor first.  Exercise on a regular basis, or as told by your doctor. GET HELP RIGHT AWAY IF:   You have bright red blood in your poop (stool).  Your constipation lasts more than 4 days or gets worse.  You have belly (abdomen) or butt (rectal) pain.  You have thin poop (as thin as a pencil).  You lose weight, and it cannot be explained. MAKE SURE YOU:   Understand these instructions.  Will watch your condition.  Will get help right away if you are not doing well or get worse. Document Released: 02/03/2008 Document Revised: 11/09/2011 Document Reviewed: 05/29/2013 [facility] Patient Information 2014 [place], [place]. Fluorouracil, 5-FU injection What is this medicine?  FLUOROURACIL, 5-FU (flure oh YOOR a sil) is a chemotherapy drug. It slows the growth of cancer cells. This medicine is used to treat many types of cancer like breast cancer, colon or rectal cancer, pancreatic cancer, and stomach cancer. This medicine may be used for other purposes; ask your health care provider or pharmacist if you have questions. What should I tell my  health care provider before I take this medicine? They need to know if you have any of these conditions: -blood disorders -dihydropyrimidine dehydrogenase (DPD) deficiency -infection (especially a virus infection such as chickenpox, cold sores, or herpes) -kidney disease -liver disease -malnourished, poor nutrition -recent or ongoing radiation therapy -an unusual or allergic reaction to fluorouracil, other chemotherapy, other medicines, foods, dyes, or preservatives -pregnant or trying to get pregnant -breast-feeding How should I use this medicine? This drug is given as an infusion or injection into a vein. It is administered in a hospital or clinic by a specially trained health care professional. Talk to your pediatrician regarding the use of this medicine in children. Special care may be needed. Overdosage: If you think you have taken too much of this medicine contact a poison control center or emergency room at once. NOTE: This medicine is only for you. Do not share this medicine with others. What if I miss a dose? It is important not to miss your dose. Call your doctor or health care professional if you are unable to keep an appointment. What may interact with this medicine? -allopurinol -cimetidine -dapsone -digoxin -hydroxyurea -leucovorin -levamisole -medicines for seizures like ethotoin, fosphenytoin, phenytoin -medicines to increase blood counts like filgrastim, pegfilgrastim, sargramostim -medicines that treat or prevent blood clots like warfarin, enoxaparin, and dalteparin -methotrexate -metronidazole -pyrimethamine -some other chemotherapy drugs like busulfan, cisplatin, estramustine, vinblastine -trimethoprim -trimetrexate -vaccines Talk to your doctor or health care professional before taking any of these medicines: -acetaminophen -aspirin -ibuprofen -ketoprofen -naproxen This list may not describe all possible interactions. Give your health care provider a  list of all the medicines, herbs, non-prescription drugs, or dietary supplements you use. Also tell them if you smoke, drink alcohol, or use illegal drugs. Some items may interact with your medicine. What should I watch for while using this medicine? Visit your doctor for checks on your progress. This drug may make you feel generally unwell. This is not uncommon, as chemotherapy can affect healthy cells as well as cancer cells. Report any side effects. Continue your course of treatment even though you feel ill unless your doctor tells you to stop. In some cases, you may be given additional medicines to help with side effects. Follow all directions for their use. Call your doctor or health care professional for advice if you get a fever, chills or sore throat, or other symptoms of a cold or flu. Do not treat yourself. This drug decreases your body's ability to fight infections. Try to avoid being around people who are sick. This medicine may increase your risk to bruise or bleed. Call your doctor or health care professional if you notice any unusual bleeding. Be careful brushing and flossing your teeth or using a toothpick because you may get an infection or bleed more easily. If you have any dental work done, tell your dentist you are receiving this medicine. Avoid taking products that contain aspirin, acetaminophen, ibuprofen, naproxen, or ketoprofen unless instructed by your doctor. These medicines may hide a fever. Do not become pregnant while taking this medicine. Women should inform their doctor if they wish to  become pregnant or think they might be pregnant. There is a potential for serious side effects to an unborn child. Talk to your health care professional or pharmacist for more information. Do not breast-feed an infant while taking this medicine. Men should inform their doctor if they wish to father a child. This medicine may lower sperm counts. Do not treat diarrhea with over the counter  products. Contact your doctor if you have diarrhea that lasts more than 2 days or if it is severe and watery. This medicine can make you more sensitive to the sun. Keep out of the sun. If you cannot avoid being in the sun, wear protective clothing and use sunscreen. Do not use sun lamps or tanning beds/booths. What side effects may I notice from receiving this medicine? Side effects that you should report to your doctor or health care professional as soon as possible: -allergic reactions like skin rash, itching or hives, swelling of the face, lips, or tongue -low blood counts - this medicine may decrease the number of white blood cells, red blood cells and platelets. You may be at increased risk for infections and bleeding. -signs of infection - fever or chills, cough, sore throat, pain or difficulty passing urine -signs of decreased platelets or bleeding - bruising, pinpoint red spots on the skin, black, tarry stools, blood in the urine -signs of decreased red blood cells - unusually weak or tired, fainting spells, lightheadedness -breathing problems -changes in vision -chest pain -mouth sores -nausea and vomiting -pain, swelling, redness at site where injected -pain, tingling, numbness in the hands or feet -redness, swelling, or sores on hands or feet -stomach pain -unusual bleeding Side effects that usually do not require medical attention (report to your doctor or health care professional if they continue or are bothersome): -changes in finger or toe nails -diarrhea -dry or itchy skin -hair loss -headache -loss of appetite -sensitivity of eyes to the light -stomach upset -unusually teary eyes This list may not describe all possible side effects. Call your doctor for medical advice about side effects. You may report side effects to FDA at 1-800-FDA-1088. Where should I keep my medicine? This drug is given in a hospital or clinic and will not be stored at home.   Irinotecan  injection What is this medicine? IRINOTECAN (ir in oh TEE kan ) is a chemotherapy drug. It is used to treat colon and rectal cancer. This medicine may be used for other purposes; ask your health care provider or pharmacist if you have questions. What should I tell my health care provider before I take this medicine? They need to know if you have any of these conditions: -blood disorders -dehydration -diarrhea -infection (especially a virus infection such as chickenpox, cold sores, or herpes) -liver disease -low blood counts, like low white cell, platelet, or red cell counts -recent or ongoing radiation therapy -an unusual or allergic reaction to irinotecan, sorbitol, other chemotherapy, other medicines, foods, dyes, or preservatives -pregnant or trying to get pregnant -breast-feeding How should I use this medicine? This drug is given as an infusion into a vein. It is administered in a hospital or clinic by a specially trained health care professional. Talk to your pediatrician regarding the use of this medicine in children. Special care may be needed. Overdosage: If you think you have taken too much of this medicine contact a poison control center or emergency room at once. NOTE: This medicine is only for you. Do not share this medicine with others. What  if I miss a dose? It is important not to miss your dose. Call your doctor or health care professional if you are unable to keep an appointment. What may interact with this medicine? Do not take this medicine with any of the following medications: -atazanavir -ketoconazole -St. John's Wort This medicine may also interact with the following medications: -dexamethasone -diuretics -laxatives -medicines for seizures like carbamazepine, mephobarbital, phenobarbital, phenytoin, primidone -medicines to increase blood counts like filgrastim, pegfilgrastim, sargramostim -prochlorperazine -vaccines This list may not describe all possible  interactions. Give your health care provider a list of all the medicines, herbs, non-prescription drugs, or dietary supplements you use. Also tell them if you smoke, drink alcohol, or use illegal drugs. Some items may interact with your medicine. What should I watch for while using this medicine? Your condition will be monitored carefully while you are receiving this medicine. You will need important blood work done while you are taking this medicine. This drug may make you feel generally unwell. This is not uncommon, as chemotherapy can affect healthy cells as well as cancer cells. Report any side effects. Continue your course of treatment even though you feel ill unless your doctor tells you to stop. In some cases, you may be given additional medicines to help with side effects. Follow all directions for their use. You may get drowsy or dizzy. Do not drive, use machinery, or do anything that needs mental alertness until you know how this medicine affects you. Do not stand or sit up quickly, especially if you are an older patient. This reduces the risk of dizzy or fainting spells. Call your doctor or health care professional for advice if you get a fever, chills or sore throat, or other symptoms of a cold or flu. Do not treat yourself. This drug decreases your body's ability to fight infections. Try to avoid being around people who are sick. This medicine may increase your risk to bruise or bleed. Call your doctor or health care professional if you notice any unusual bleeding. Be careful brushing and flossing your teeth or using a toothpick because you may get an infection or bleed more easily. If you have any dental work done, tell your dentist you are receiving this medicine. Avoid taking products that contain aspirin, acetaminophen, ibuprofen, naproxen, or ketoprofen unless instructed by your doctor. These medicines may hide a fever. Do not become pregnant while taking this medicine. Women should  inform their doctor if they wish to become pregnant or think they might be pregnant. There is a potential for serious side effects to an unborn child. Talk to your health care professional or pharmacist for more information. Do not breast-feed an infant while taking this medicine. What side effects may I notice from receiving this medicine? Side effects that you should report to your doctor or health care professional as soon as possible: -allergic reactions like skin rash, itching or hives, swelling of the face, lips, or tongue -low blood counts - this medicine may decrease the number of white blood cells, red blood cells and platelets. You may be at increased risk for infections and bleeding. -signs of infection - fever or chills, cough, sore throat, pain or difficulty passing urine -signs of decreased platelets or bleeding - bruising, pinpoint red spots on the skin, black, tarry stools, blood in the urine -signs of decreased red blood cells - unusually weak or tired, fainting spells, lightheadedness -breathing problems -chest pain -diarrhea -feeling faint or lightheaded, falls -flushing, runny nose, sweating during infusion -  mouth sores or pain -pain, swelling, redness or irritation where injected -pain, swelling, warmth in the leg -pain, tingling, numbness in the hands or feet -problems with balance, talking, walking -stomach cramps, pain -trouble passing urine or change in the amount of urine -vomiting as to be unable to hold down drinks or food -yellowing of the eyes or skin Side effects that usually do not require medical attention (report to your doctor or health care professional if they continue or are bothersome): -constipation -hair loss -headache -loss of appetite -nausea, vomiting -stomach upset This list may not describe all possible side effects. Call your doctor for medical advice about side effects. You may report side effects to FDA at 1-800-FDA-1088. Where should I  keep my medicine? This drug is given in a hospital or clinic and will not be stored at home. NOTE: This sheet is a summary. It may not cover all possible information. If you have questions about this medicine, talk to your doctor, pharmacist, or health care provider  Oxaliplatin Injection What is this medicine? OXALIPLATIN (ox AL i PLA tin) is a chemotherapy drug. It targets fast dividing cells, like cancer cells, and causes these cells to die. This medicine is used to treat cancers of the colon and rectum, and many other cancers. This medicine may be used for other purposes; ask your health care provider or pharmacist if you have questions. What should I tell my health care provider before I take this medicine? They need to know if you have any of these conditions: -kidney disease -an unusual or allergic reaction to oxaliplatin, other chemotherapy, other medicines, foods, dyes, or preservatives -pregnant or trying to get pregnant -breast-feeding How should I use this medicine? This drug is given as an infusion into a vein. It is administered in a hospital or clinic by a specially trained health care professional. Talk to your pediatrician regarding the use of this medicine in children. Special care may be needed. Overdosage: If you think you have taken too much of this medicine contact a poison control center or emergency room at once. NOTE: This medicine is only for you. Do not share this medicine with others. What if I miss a dose? It is important not to miss a dose. Call your doctor or health care professional if you are unable to keep an appointment. What may interact with this medicine? -medicines to increase blood counts like filgrastim, pegfilgrastim, sargramostim -probenecid -some antibiotics like amikacin, gentamicin, neomycin, polymyxin B, streptomycin, tobramycin -zalcitabine Talk to your doctor or health care professional before taking any of these  medicines: -acetaminophen -aspirin -ibuprofen -ketoprofen -naproxen This list may not describe all possible interactions. Give your health care provider a list of all the medicines, herbs, non-prescription drugs, or dietary supplements you use. Also tell them if you smoke, drink alcohol, or use illegal drugs. Some items may interact with your medicine. What should I watch for while using this medicine? Your condition will be monitored carefully while you are receiving this medicine. You will need important blood work done while you are taking this medicine. This medicine can make you more sensitive to cold. Do not drink cold drinks or use ice. Cover exposed skin before coming in contact with cold temperatures or cold objects. When out in cold weather wear warm clothing and cover your mouth and nose to warm the air that goes into your lungs. Tell your doctor if you get sensitive to the cold. This drug may make you feel generally unwell. This is  not uncommon, as chemotherapy can affect healthy cells as well as cancer cells. Report any side effects. Continue your course of treatment even though you feel ill unless your doctor tells you to stop. In some cases, you may be given additional medicines to help with side effects. Follow all directions for their use. Call your doctor or health care professional for advice if you get a fever, chills or sore throat, or other symptoms of a cold or flu. Do not treat yourself. This drug decreases your body's ability to fight infections. Try to avoid being around people who are sick. This medicine may increase your risk to bruise or bleed. Call your doctor or health care professional if you notice any unusual bleeding. Be careful brushing and flossing your teeth or using a toothpick because you may get an infection or bleed more easily. If you have any dental work done, tell your dentist you are receiving this medicine. Avoid taking products that contain aspirin,  acetaminophen, ibuprofen, naproxen, or ketoprofen unless instructed by your doctor. These medicines may hide a fever. Do not become pregnant while taking this medicine. Women should inform their doctor if they wish to become pregnant or think they might be pregnant. There is a potential for serious side effects to an unborn child. Talk to your health care professional or pharmacist for more information. Do not breast-feed an infant while taking this medicine. Call your doctor or health care professional if you get diarrhea. Do not treat yourself. What side effects may I notice from receiving this medicine? Side effects that you should report to your doctor or health care professional as soon as possible: -allergic reactions like skin rash, itching or hives, swelling of the face, lips, or tongue -low blood counts - This drug may decrease the number of white blood cells, red blood cells and platelets. You may be at increased risk for infections and bleeding. -signs of infection - fever or chills, cough, sore throat, pain or difficulty passing urine -signs of decreased platelets or bleeding - bruising, pinpoint red spots on the skin, black, tarry stools, nosebleeds -signs of decreased red blood cells - unusually weak or tired, fainting spells, lightheadedness -breathing problems -chest pain, pressure -cough -diarrhea -jaw tightness -mouth sores -nausea and vomiting -pain, swelling, redness or irritation at the injection site -pain, tingling, numbness in the hands or feet -problems with balance, talking, walking -redness, blistering, peeling or loosening of the skin, including inside the mouth -trouble passing urine or change in the amount of urine Side effects that usually do not require medical attention (report to your doctor or health care professional if they continue or are bothersome): -changes in vision -constipation -hair loss -loss of appetite -metallic taste in the mouth or changes  in taste -stomach pain This list may not describe all possible side effects. Call your doctor for medical advice about side effects. You may report side effects to FDA at 1-800-FDA-1088. Where should I keep my medicine? This drug is given in a hospital or clinic and will not be stored at home. NOTE: This sheet is a summary. It may not cover all possible information. If you have questions about this medicine, talk to your doctor, pharmacist, or health care provider.  2012, Elsevier/Gold Standard. (03/13/2008 5:22:47 PM).  2012, Elsevier/Gold Standard. (01/03/2008 4:29:12 PM) NOTE: This sheet is a summary. It may not cover all possible information. If you have questions about this medicine, talk to your doctor, pharmacist, or health care provider.  2012, Elsevier/Gold  Standard. (12/21/2007 1:53:16 PM)

## 2013-11-29 NOTE — Progress Notes (Signed)
Hematology and Oncology Follow Up Visit  Collin Young [id number] 1951/05/26 63 y.o. 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 He actually looks quite well. He is losing weight. He has a G-tube that he has been using. He is using this for feedings. He was not able to swallow. He is having significant odontophagia.  He is on a fentanyl patch. He is also on Roxanol for pain.  There's been no bleeding. He's had no vomiting. He's had no diarrhea.  He'll finish up his radiation next week. He does have the radiation burns on his skin. These do not seem to be bothering him too much.  Medications: Current outpatient prescriptions:Alum & Mag Hydroxide-Simeth (MAGIC MOUTHWASH W/LIDOCAINE) SOLN, Take 5 mLs by mouth 4 (four) times daily as needed for mouth pain., Disp: 250 mL, Rfl: 1;  dexamethasone (DECADRON) 4 MG tablet, Take 2 tablets by mouth once a day on the day after chemotherapy and then take 2 tablets two times a day for 2 days. Take with food., Disp: 30 tablet, Rfl: 1 emollient (BIAFINE) cream, Apply 1 application topically 2 (two) times daily., Disp: , Rfl: ;  fentaNYL (DURAGESIC) 12 MCG/HR, Place 1 patch (12.5 mcg total) onto the skin every 3 (three) days., Disp: 10 patch, Rfl: 0;  LORazepam (ATIVAN) 0.5 MG tablet, Take 1 tablet (0.5 mg total) by mouth every 6 (six) hours as needed (Nausea or vomiting)., Disp: 30 tablet, Rfl: 0 ondansetron (ZOFRAN) 8 MG tablet, Take 1 tablet (8 mg total) by mouth 2 (two) times daily as needed. Take two times a day as needed for nausea or vomiting starting on the third day after chemotherapy., Disp: 30 tablet, Rfl: 1;  prochlorperazine (COMPAZINE) 10 MG tablet, Take 1 tablet (10 mg total) by mouth every 6  (six) hours as needed (Nausea or vomiting)., Disp: 30 tablet, Rfl: 1 scopolamine (TRANSDERM-SCOP) 1 MG/3DAYS, Place 1 patch (1.5 mg total) onto the skin every 3 (three) days., Disp: 10 patch, Rfl: 0;  UNKNOWN TO PATIENT, 11-08-13   Pt does not know name of medication (BP), Disp: , Rfl: ;  morphine (ROXANOL) 20 MG/ML concentrated solution, Take 0.5 mLs (10 mg total) by mouth every 3 (three) hours as needed for severe pain., Disp: 30 mL, Rfl: 0  Allergies: No Known Allergies  Past Medical History, Surgical history, Social history, and Family History were reviewed and updated.  Review of Systems: As above  Physical Exam:  weight is 127 lb 4 oz (57.72 kg). His temperature is 98 F (36.7 C). His blood pressure is 139/11 and his pulse is 80. His respiration is 20.  10 African American gentleman. Oral exam shows pharyngeal erythema. There is no oral be excised. Neck is supple. He has radiation burns. There is no lymphadenopathy. Lungs are clear. He does have some scattered crackles bilaterally. Cardiac exam regular rate and rhythm. Abdomen soft. G-tube site is intact. There is no exudate or erythema. He has no fluid wave. There is no palpable liver or spleen tip. Back exam no tenderness over the spine ribs or hips. Extremities shows no clubbing cyanosis or edema. There may be slight most likely. Has good muscle strength. Skin exam shows a radiation changes on his neck. No obvious skin breakdown  or infection is noted.  Lab Results  Component Value Date   WBC 2.6* 11/29/2013   HGB 12.7* 11/29/2013   HCT 37.7* 11/29/2013   MCV 86 11/29/2013   PLT 160 11/29/2013     Chemistry      Component Value Date/Time   NA 133 11/29/2013 0804   NA 136* 10/03/2013 0755   K 4.5 11/29/2013 0804   K 4.0 10/03/2013 0755   CL 86* 11/29/2013 0804   CL 98 10/03/2013 0755   CO2 36* 11/29/2013 0804   CO2 28 10/03/2013 0755   BUN 17 11/29/2013 0804   BUN 12 10/03/2013 0755   CREATININE 0.8 11/29/2013 0804   CREATININE 1.04 10/03/2013 0755       Component Value Date/Time   CALCIUM 9.2 11/29/2013 0804   CALCIUM 9.0 10/03/2013 0755   ALKPHOS 48 11/29/2013 0804   ALKPHOS 47 09/13/2013 1149   AST 29 11/29/2013 0804   AST 27 09/13/2013 1149   ALT 22 11/29/2013 0804   ALT 20 09/13/2013 1149   BILITOT 0.70 11/29/2013 0804   BILITOT 0.3 09/13/2013 1149         Impression and Plan: Collin Young is 63 year old gentleman. 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  I'll give him one final dose of cisplatin. He's done well. I want to make sure that we will be aggressive and treat this aggressively.  We will be aggressive with IV fluids.  I'll plan to see him back in another month or so.  I would not plan for any followup scans probably for a good 2 months.  I went over the labs with he and his wife. I will refill his morphine elixir.  I spent a good 25 minutes with him today.   [name], MD 4/1/20158:46 AM

## 2013-11-29 NOTE — Addendum Note (Signed)
Addended by: Burney Gauze R on: 11/29/2013 09:24 AM   Modules accepted: Orders, Medications

## 2013-11-30 ENCOUNTER — Ambulatory Visit
Admission: RE | Admit: 2013-11-30 | Discharge: 2013-11-30 | Disposition: A | Discharge status: Home / Self Care | Payer: Medicaid Other | Source: Ambulatory Visit | Attending: Radiation Oncology | Admitting: Radiation Oncology

## 2013-11-30 ENCOUNTER — Encounter: Payer: Self-pay | Admitting: Radiation Oncology

## 2013-11-30 VITALS — BP 161/85 | HR 90 | Temp 98.7°F | Resp 20 | Wt 126.4 lb

## 2013-11-30 DIAGNOSIS — C12 Malignant neoplasm of pyriform sinus: Secondary | ICD-10-CM

## 2013-11-30 MED ORDER — BIAFINE EX EMUL
Freq: Two times a day (BID) | CUTANEOUS | Status: DC
  Administered 2013-11-30: 17:00:00 via TOPICAL

## 2013-11-30 NOTE — Progress Notes (Signed)
  Radiation Oncology         ([phone]) [phone] ________________________________  Name: Collin Young MRN: [id number]  Date: 11/30/2013  DOB: February 19, 1951  Weekly Radiation Therapy Management  Current Dose: 63.6 Gy     Planned Dose:  69.96 Gy  Narrative . . . . . . . . The patient presents for routine under treatment assessment.                                   The patient is without complaint. Scopolamine patch helping slightly with secretions                                 Set-up films were reviewed.                                 The chart was checked. Physical Findings. . .  weight is 126 lb 6.4 oz (57.335 kg). His oral temperature is 98.7 F (37.1 C). His blood pressure is 161/85 and his pulse is 90. His respiration is 20 and oxygen saturation is 100%. . No palpable adenopathy in the neck. Patient has hyperpigmentation changes and erythema but no moist desquamation. The posterior pharynx shows some mucositis but no hemorrhagic mucositis. No secondary infection noted the oral cavity. Impression . . . . . . . The patient is tolerating radiation. Plan . . . . . . . . . . . . Continue treatment as planned.  ________________________________   Blair Promise, PhD, MD

## 2013-11-30 NOTE — Progress Notes (Signed)
Weekly rad txs larynx, 30/33 complet, still has copious amounts of sputum, isn't wearing his scoplamine patch today, will put one on when he gets home, using dial soap, skin dry desquamation on front and baclk of neck, using biafine bid,drinks  blenderized foods, and ensure plus, and osmolite via peg tube, had Cisplatin at [facility] office yesterdy,states last chemotherapy, throat is irritated, on fentanyl patch, using 2 12.5 mcg fentanyl patches, but seen 3 on left side of chest,  and roxanol for pain prn, took ortho vitals, sitting=163/81,P=92 Standing b/p=161/85,P=90, rr=20. 100% room air sats, no c/o nausea, some fatigue 4:36 PM  4:36 PM

## 2013-11-30 NOTE — Addendum Note (Signed)
Encounter addended by: Jacqulyn Liner, RN on: 11/30/2013  5:05 PM<BR>     Documentation filed: Orders, Inpatient MAR

## 2013-12-01 ENCOUNTER — Ambulatory Visit
Admission: RE | Admit: 2013-12-01 | Discharge: 2013-12-01 | Disposition: A | Discharge status: Home / Self Care | Payer: Medicaid Other | Source: Ambulatory Visit | Attending: Radiation Oncology | Admitting: Radiation Oncology

## 2013-12-01 DIAGNOSIS — C12 Malignant neoplasm of pyriform sinus: Secondary | ICD-10-CM

## 2013-12-01 MED ORDER — BIAFINE EX EMUL
CUTANEOUS | Status: DC | PRN
  Administered 2013-12-01: 16:00:00 via TOPICAL

## 2013-12-03 ENCOUNTER — Emergency Department (HOSPITAL_COMMUNITY)
Admission: EM | Admit: 2013-12-03 | Discharge: 2013-12-03 | Disposition: A | Discharge status: Home / Self Care | Payer: Medicaid Other | Attending: Emergency Medicine | Admitting: Emergency Medicine

## 2013-12-03 ENCOUNTER — Emergency Department (HOSPITAL_COMMUNITY): Payer: Medicaid Other

## 2013-12-03 ENCOUNTER — Encounter (HOSPITAL_COMMUNITY): Payer: Self-pay | Admitting: Emergency Medicine

## 2013-12-03 DIAGNOSIS — C159 Malignant neoplasm of esophagus, unspecified: Secondary | ICD-10-CM | POA: Insufficient documentation

## 2013-12-03 DIAGNOSIS — K219 Gastro-esophageal reflux disease without esophagitis: Secondary | ICD-10-CM | POA: Insufficient documentation

## 2013-12-03 DIAGNOSIS — I10 Essential (primary) hypertension: Secondary | ICD-10-CM | POA: Insufficient documentation

## 2013-12-03 DIAGNOSIS — Z87891 Personal history of nicotine dependence: Secondary | ICD-10-CM | POA: Insufficient documentation

## 2013-12-03 DIAGNOSIS — K5900 Constipation, unspecified: Secondary | ICD-10-CM | POA: Insufficient documentation

## 2013-12-03 DIAGNOSIS — R110 Nausea: Secondary | ICD-10-CM | POA: Insufficient documentation

## 2013-12-03 DIAGNOSIS — Z79899 Other long term (current) drug therapy: Secondary | ICD-10-CM | POA: Insufficient documentation

## 2013-12-03 DIAGNOSIS — R109 Unspecified abdominal pain: Secondary | ICD-10-CM | POA: Insufficient documentation

## 2013-12-03 DIAGNOSIS — Z9221 Personal history of antineoplastic chemotherapy: Secondary | ICD-10-CM | POA: Insufficient documentation

## 2013-12-03 DIAGNOSIS — E785 Hyperlipidemia, unspecified: Secondary | ICD-10-CM | POA: Insufficient documentation

## 2013-12-03 MED ORDER — FLEET ENEMA 7-19 GM/118ML RE ENEM
1.0000 | ENEMA | Freq: Once | RECTAL | Status: AC
  Administered 2013-12-03: 1 via RECTAL
  Filled 2013-12-03: qty 1

## 2013-12-03 NOTE — Discharge Instructions (Signed)
Constipation, Adult Constipation is when a person has fewer than 3 bowel movements a week; has difficulty having a bowel movement; or has stools that are dry, hard, or larger than normal. As people grow older, constipation is more common. If you try to fix constipation with medicines that make you have a bowel movement (laxatives), the problem may get worse. Long-term laxative use may cause the muscles of the colon to become weak. A low-fiber diet, not taking in enough fluids, and taking certain medicines may make constipation worse. CAUSES   Certain medicines, such as antidepressants, pain medicine, iron supplements, antacids, and water pills.   Certain diseases, such as diabetes, irritable bowel syndrome (IBS), thyroid disease, or depression.   Not drinking enough water.   Not eating enough fiber-rich foods.   Stress or travel.  Lack of physical activity or exercise.  Not going to the restroom when there is the urge to have a bowel movement.  Ignoring the urge to have a bowel movement.  Using laxatives too much. SYMPTOMS   Having fewer than 3 bowel movements a week.   Straining to have a bowel movement.   Having hard, dry, or larger than normal stools.   Feeling full or bloated.   Pain in the lower abdomen.  Not feeling relief after having a bowel movement. DIAGNOSIS  Your caregiver will take a medical history and perform a physical exam. Further testing may be done for severe constipation. Some tests may include:   A barium enema X-ray to examine your rectum, colon, and sometimes, your small intestine.  A sigmoidoscopy to examine your lower colon.  A colonoscopy to examine your entire colon. TREATMENT  Treatment will depend on the severity of your constipation and what is causing it. Some dietary treatments include drinking more fluids and eating more fiber-rich foods. Lifestyle treatments may include regular exercise. If these diet and lifestyle recommendations  do not help, your caregiver may recommend taking over-the-counter laxative medicines to help you have bowel movements. Prescription medicines may be prescribed if over-the-counter medicines do not work.  HOME CARE INSTRUCTIONS   Increase dietary fiber in your diet, such as fruits, vegetables, whole grains, and beans. Limit high-fat and processed sugars in your diet, such as [place] fries, hamburgers, cookies, candies, and soda.   A fiber supplement may be added to your diet if you cannot get enough fiber from foods.   Drink enough fluids to keep your urine clear or pale yellow.   Exercise regularly or as directed by your caregiver.   Go to the restroom when you have the urge to go. Do not hold it.  Only take medicines as directed by your caregiver. Do not take other medicines for constipation without talking to your caregiver first. [place] IF:   You have bright red blood in your stool.   Your constipation lasts for more than 4 days or gets worse.   You have abdominal or rectal pain.   You have thin, pencil-like stools.  You have unexplained weight loss. MAKE SURE YOU:   Understand these instructions.  Will watch your condition.  Will get help right away if you are not doing well or get worse. Document Released: 05/15/2004 Document Revised: 11/09/2011 Document Reviewed: 05/29/2013 [facility] Patient Information 2014 [place], [place].  Fiber Content in Foods Drinking plenty of fluids and consuming foods high in fiber can help with constipation. See the list below for the fiber content of some common foods. Starches and Grains / Dietary  Fiber (g)  Cheerios, 1 cup / 3 g  Kellogg's Corn Flakes, 1 cup / 0.7 g  Rice Krispies, 1  cup / 0.3 g  Quaker Oat Life Cereal,  cup / 2.1 g  Oatmeal, instant (cooked),  cup / 2 g  Kellogg's Frosted Mini Wheats, 1 cup / 5.1 g  Rice, brown, long-grain (cooked), 1 cup / 3.5 g  Rice, white, long-grain (cooked),  1 cup / 0.6 g  Macaroni, cooked, enriched, 1 cup / 2.5 g Legumes / Dietary Fiber (g)  Beans, baked, canned, plain or vegetarian,  cup / 5.2 g  Beans, kidney, canned,  cup / 6.8 g  Beans, pinto, dried (cooked),  cup / 7.7 g  Beans, pinto, canned,  cup / 5.5 g Breads and Crackers / Dietary Fiber (g)  Graham crackers, plain or honey, 2 squares / 0.7 g  Saltine crackers, 3 squares / 0.3 g  Pretzels, plain, salted, 10 pieces / 1.8 g  Bread, whole-wheat, 1 slice / 1.9 g  Bread, white, 1 slice / 0.7 g  Bread, raisin, 1 slice / 1.2 g  Bagel, plain, 3 oz / 2 g  Tortilla, flour, 1 oz / 0.9 g  Tortilla, corn, 1 small / 1.5 g  Bun, hamburger or hotdog, 1 small / 0.9 g Fruits / Dietary Fiber (g)  Apple, raw with skin, 1 medium / 4.4 g  Applesauce, sweetened,  cup / 1.5 g  Banana,  medium / 1.5 g  Grapes, 10 grapes / 0.4 g  Orange, 1 small / 2.3 g  Raisin, 1.5 oz / 1.6 g  Melon, 1 cup / 1.4 g Vegetables / Dietary Fiber (g)  Green beans, canned,  cup / 1.3 g  Carrots (cooked),  cup / 2.3 g  Broccoli (cooked),  cup / 2.8 g  Peas, frozen (cooked),  cup / 4.4 g  Potatoes, mashed,  cup / 1.6 g  Lettuce, 1 cup / 0.5 g  Corn, canned,  cup / 1.6 g  Tomato,  cup / 1.1 g Document Released: 01/03/2007 Document Revised: 11/09/2011 Document Reviewed: 02/28/2007 [employer] Patient Information 2014 [place], [place].

## 2013-12-03 NOTE — ED Provider Notes (Signed)
CSN: [id number]     Arrival date & time 12/03/13  1553 History   First MD Initiated Contact with Patient 12/03/13 1607     Chief Complaint  Patient presents with  . Constipation  . Abdominal Pain     (Consider location/radiation/quality/duration/timing/severity/associated sxs/prior Treatment) Patient is a 63 y.o. male presenting with constipation and abdominal pain. The history is provided by the patient.  Constipation Associated symptoms: abdominal pain   Abdominal Pain Associated symptoms: constipation    Patient here complaining of constipation x5 days with nausea no vomiting. Has not had a bowel movement since then. Denies any fever or chills. Has used over-the-counter medications without relief. Does have a history of throat cancer and takes opiates daily. Symptoms have been persistent. Nothing makes them better.  Past Medical History  Diagnosis Date  . Hypertension   . Hyperlipemia   . HOH (hard of hearing)   . Seizures     alcohol withdrawls- 2001  . GERD (gastroesophageal reflux disease)     takes zantac prn  . Cancer     Esophagus- radiation 2011   Past Surgical History  Procedure Laterality Date  . No past surgeries    . Cardiovascular stress test  10/09/09    normal nuclearr stress test, EF 57% [name])  . Laryngoscopy N/A 09/15/2013    Procedure: LARYNGOSCOPY;  Surgeon: [name], MD;  Location: [facility] OR;  Service: ENT;  Laterality: N/A;  direct laryngoscopy with biopsy and esophagoscopy   History reviewed. No pertinent family history. History  Substance Use Topics  . Smoking status: Former Smoker -- 1.00 packs/day for 40 years    Types: Cigarettes    Start date: 11/08/1960    Quit date: 09/01/1999  . Smokeless tobacco: Former Systems developer    Types: [place] date: 09/01/1999     Comment: quit in 2011  . Alcohol Use: No     Comment: quit in 2001    Review of Systems  Gastrointestinal: Positive for abdominal pain and constipation.  All other systems  reviewed and are negative.      Allergies  Review of patient's allergies indicates no known allergies.  Home Medications   Current Outpatient Rx  Name  Route  Sig  Dispense  Refill  . Alum & Mag Hydroxide-Simeth (MAGIC MOUTHWASH W/LIDOCAINE) SOLN   Oral   Take 5 mLs by mouth 4 (four) times daily as needed for mouth pain.   250 mL   1   . amLODipine (NORVASC) 5 MG tablet   Oral   Take 5 mg by mouth daily.         Marland Kitchen dexamethasone (DECADRON) 4 MG tablet      Take 2 tablets by mouth once a day on the day after chemotherapy and then take 2 tablets two times a day for 2 days. Take with food.   30 tablet   1   . emollient (BIAFINE) cream   Topical   Apply 1 application topically 2 (two) times daily.         . fentaNYL (DURAGESIC - DOSED MCG/HR) 12 MCG/HR   Transdermal   Place 25 mcg onto the skin every 3 (three) days.         Marland Kitchen morphine (ROXANOL) 20 MG/ML concentrated solution   Oral   Take 0.5 mLs (10 mg total) by mouth every 3 (three) hours as needed for severe pain.   30 mL   0   . scopolamine (TRANSDERM-SCOP) 1 MG/3DAYS  Transdermal   Place 1 patch (1.5 mg total) onto the skin every 3 (three) days.   10 patch   0   . LORazepam (ATIVAN) 0.5 MG tablet   Oral   Take 1 tablet (0.5 mg total) by mouth every 6 (six) hours as needed (Nausea or vomiting).   30 tablet   0   . ondansetron (ZOFRAN) 8 MG tablet   Oral   Take 1 tablet (8 mg total) by mouth 2 (two) times daily as needed. Take two times a day as needed for nausea or vomiting starting on the third day after chemotherapy.   30 tablet   1   . prochlorperazine (COMPAZINE) 10 MG tablet   Oral   Take 1 tablet (10 mg total) by mouth every 6 (six) hours as needed (Nausea or vomiting).   30 tablet   1    BP 150/73  Pulse 103  Temp(Src) 98.5 F (36.9 C) (Oral)  Resp 14  SpO2 99% Physical Exam  Nursing note and vitals reviewed. Constitutional: He is oriented to person, place, and time. He appears  well-developed and well-nourished.  Non-toxic appearance. No distress.  HENT:  Head: Normocephalic and atraumatic.  Eyes: Conjunctivae, EOM and lids are normal. Pupils are equal, round, and reactive to light.  Neck: Normal range of motion. Neck supple. No tracheal deviation present. No mass present.  Cardiovascular: Normal rate, regular rhythm and normal heart sounds.  Exam reveals no gallop.   No murmur heard. Pulmonary/Chest: Effort normal and breath sounds normal. No stridor. No respiratory distress. He has no decreased breath sounds. He has no wheezes. He has no rhonchi. He has no rales.  Abdominal: Soft. Normal appearance and bowel sounds are normal. He exhibits no distension. There is no tenderness. There is no rigidity, no rebound, no guarding and no CVA tenderness.  Genitourinary:  Hard stool noted without evidence of impaction   Musculoskeletal: Normal range of motion. He exhibits no edema and no tenderness.  Neurological: He is alert and oriented to person, place, and time. He has normal strength. No cranial nerve deficit or sensory deficit. GCS eye subscore is 4. GCS verbal subscore is 5. GCS motor subscore is 6.  Skin: Skin is warm and dry. No abrasion and no rash noted.  Psychiatric: He has a normal mood and affect. His speech is normal and behavior is normal.    ED Course  Procedures (including critical care time) Labs Review Labs Reviewed - No data to display Imaging Review No results found.   EKG Interpretation None      MDM   Final diagnoses:  None    Patient given enema by nursing with a fantastic result. Patient feels better and is stable for d/c    Leota Jacobsen, MD 12/03/13 1744

## 2013-12-03 NOTE — ED Notes (Addendum)
Pt c/o constipation x 4 days and R side pain starting this morning.  Pain score 5/10.  Pt sts "it feels like I need to make a bowel movement, but nothing comes out."  Pt reports taking OTC laxatives w/o relief.  Hx of esophageal cancer.  Last chemo treatment x 4 days ago.

## 2013-12-04 ENCOUNTER — Ambulatory Visit: Payer: Medicaid Other

## 2013-12-04 ENCOUNTER — Ambulatory Visit
Admission: RE | Admit: 2013-12-04 | Discharge: 2013-12-04 | Disposition: A | Discharge status: Home / Self Care | Payer: Medicaid Other | Source: Ambulatory Visit | Attending: Radiation Oncology | Admitting: Radiation Oncology

## 2013-12-05 ENCOUNTER — Encounter: Payer: Self-pay | Admitting: Radiation Oncology

## 2013-12-05 ENCOUNTER — Ambulatory Visit: Payer: Medicaid Other

## 2013-12-05 ENCOUNTER — Ambulatory Visit
Admission: RE | Admit: 2013-12-05 | Discharge: 2013-12-05 | Disposition: A | Discharge status: Home / Self Care | Payer: Medicaid Other | Source: Ambulatory Visit | Attending: Radiation Oncology | Admitting: Radiation Oncology

## 2013-12-05 ENCOUNTER — Encounter: Payer: Self-pay | Admitting: *Deleted

## 2013-12-05 VITALS — BP 134/88 | HR 111 | Resp 16 | Wt 126.0 lb

## 2013-12-05 DIAGNOSIS — C12 Malignant neoplasm of pyriform sinus: Secondary | ICD-10-CM

## 2013-12-05 MED ORDER — BIAFINE EX EMUL
Freq: Once | CUTANEOUS | Status: AC
  Administered 2013-12-05: 16:00:00 via TOPICAL

## 2013-12-05 MED ORDER — EMOLLIENT BASE EX CREA: TOPICAL_CREAM | CUTANEOUS | Status: DC | PRN

## 2013-12-05 NOTE — Progress Notes (Signed)
Weekly Management Note Current Dose:69.96 Gy  Projected Dose: 69.96Gy   Narrative:  The patient presents for routine under treatment assessment.  CBCT/MVCT images/Port film x-rays were reviewed.  The chart was checked. Doing well. Pain controlled. Not eating much by mouth. Doing 6 cans per day in tube.   Physical Findings:  Neck skin intact. Mucocitis in posterior oropharynx. Weight stable and good.   Vitals:  Filed Vitals:   12/05/13 1534  BP: 134/88  Pulse: 111  Resp: 16   Weight:  Wt Readings from Last 3 Encounters:  12/05/13 126 lb (57.153 kg)  11/30/13 126 lb 6.4 oz (57.335 kg)  11/29/13 127 lb 4 oz (57.72 kg)   Lab Results  Component Value Date   WBC 2.6* 11/29/2013   HGB 12.7* 11/29/2013   HCT 37.7* 11/29/2013   MCV 86 11/29/2013   PLT 160 11/29/2013   Lab Results  Component Value Date   CREATININE 0.8 11/29/2013   BUN 17 11/29/2013   NA 133 11/29/2013   K 4.5 11/29/2013   CL 86* 11/29/2013   CO2 36* 11/29/2013     Impression:  The patient is tolerating radiation.  Plan:  Continue treatment as planned. Continue biafene. Follow up in 1 month. Sooner prn.

## 2013-12-05 NOTE — Progress Notes (Signed)
To provide support and care continuity, met with patient after final RT and PUT with Dr. Pablo Ledger.   I explained that my role as navigator will continue for several more months and that I will be calling and/or joining him during follow-up visits.  Pt expressed understanding.  Initiating navigation as L3 patient (treatments completed) with this encounter.  Gayleen Orem, RN, BSN, [facility] On[facility] Navigator [phone]

## 2013-12-05 NOTE — Progress Notes (Signed)
Presented to the clinic following final treatment. One month follow up appointment card given. Reports drinking six cans of ensure per day. Weight stable. Patient reports he is only able to consume liquids. One ulceration noted inside lower lip. Reports mild discomfort when he swallows. Reports thick rope like saliva. Reports mild dry mouth. Reports fatigue. Hyperpigmentation with dry desquamation noted right anterior neck. Reports using biafine cream bid as directed. Provided patient with an additional tube. Patient understands to use biafine bid for the next two weeks.

## 2013-12-08 ENCOUNTER — Other Ambulatory Visit: Payer: Self-pay | Admitting: *Deleted

## 2013-12-08 DIAGNOSIS — C12 Malignant neoplasm of pyriform sinus: Secondary | ICD-10-CM

## 2013-12-08 MED ORDER — MORPHINE SULFATE (CONCENTRATE) 20 MG/ML PO SOLN: 10.0000 mg | ORAL | Status: DC | PRN

## 2013-12-08 MED ORDER — FENTANYL 12 MCG/HR TD PT72: 25.0000 ug | MEDICATED_PATCH | TRANSDERMAL | Status: DC

## 2013-12-11 ENCOUNTER — Ambulatory Visit (HOSPITAL_COMMUNITY): Payer: Self-pay | Admitting: Dentistry

## 2013-12-15 NOTE — Progress Notes (Signed)
  Radiation Oncology         [phone] ________________________________  Name: Collin Young MRN: [id number]  Date: 12/05/2013  DOB: Jan 31, 1951  End of Treatment Note  Diagnosis:   Squamous cell carcinoma of the pyriform sinus     Indication for treatment:  Curative       Radiation treatment dates:   10/19/2013 through 12/05/2013  Site/dose:   The patient was treated to the tumor within the pyriform sinus region as  well as regional lymph node regions at risk. The patient was treated to the high dose region to a dose of 69.96 gray in 33 fractions at 2.12 gray per fraction. The patient was treated with  IMRT with concurrent chemotherapy. Daily image guidance was used for his treatment.   Narrative: The patient tolerated radiation treatment relatively well.   The patient experienced significant skin irritation as well as irritation of the throat. He relied on his feeding tube at the end of treatment for almost all of his nutrition. The patient's skin was intact at the end of treatment.   Plan: The patient has completed radiation treatment. The patient will return to radiation oncology clinic for routine followup in one month. I advised the patient to call or return sooner if they have any questions or concerns related to their recovery or treatment. ________________________________  Jodelle Gross, M.D., Ph.D.

## 2013-12-18 ENCOUNTER — Other Ambulatory Visit: Payer: Self-pay | Admitting: *Deleted

## 2013-12-18 ENCOUNTER — Telehealth: Payer: Self-pay | Admitting: *Deleted

## 2013-12-18 DIAGNOSIS — C12 Malignant neoplasm of pyriform sinus: Secondary | ICD-10-CM

## 2013-12-18 DIAGNOSIS — D490 Neoplasm of unspecified behavior of digestive system: Secondary | ICD-10-CM

## 2013-12-18 MED ORDER — FENTANYL 12 MCG/HR TD PT72: 25.0000 ug | MEDICATED_PATCH | TRANSDERMAL | Status: DC

## 2013-12-18 MED ORDER — MORPHINE SULFATE (CONCENTRATE) 20 MG/ML PO SOLN: 10.0000 mg | ORAL | Status: DC | PRN

## 2013-12-18 NOTE — Telephone Encounter (Signed)
Refill already done. 

## 2013-12-19 NOTE — Telephone Encounter (Signed)
Called patient as part of routine s/p final RT  treatment follow-up.  He stated he has been experiencing break through throat pain but is controlling it with PRN medication, using salt/baking soda salt rinses multiple times daily, using feeding tube exclusively for nutrition (6 cans/d) and hydration.  His voice is extremely hoarse.  He denied any needs at this time, I encourage him to call me if that changes.  He verbalized understanding.  Gayleen Orem, RN, BSN, [facility] Navigator [phone]

## 2013-12-28 MED ORDER — DEXAMETHASONE SODIUM PHOSPHATE 10 MG/ML IJ SOLN
INTRAMUSCULAR | Status: AC
  Filled 2013-12-28: qty 1

## 2013-12-28 MED ORDER — ONDANSETRON 8 MG/NS 50 ML IVPB
INTRAVENOUS | Status: AC
  Filled 2013-12-28: qty 8

## 2013-12-29 ENCOUNTER — Ambulatory Visit (HOSPITAL_BASED_OUTPATIENT_CLINIC_OR_DEPARTMENT_OTHER): Payer: Medicaid Other

## 2013-12-29 ENCOUNTER — Encounter: Payer: Self-pay | Admitting: Hematology & Oncology

## 2013-12-29 ENCOUNTER — Ambulatory Visit (HOSPITAL_BASED_OUTPATIENT_CLINIC_OR_DEPARTMENT_OTHER): Payer: Medicaid Other | Admitting: Hematology & Oncology

## 2013-12-29 ENCOUNTER — Other Ambulatory Visit (HOSPITAL_BASED_OUTPATIENT_CLINIC_OR_DEPARTMENT_OTHER): Payer: Medicaid Other | Admitting: Lab

## 2013-12-29 VITALS — BP 141/72 | HR 102 | Temp 97.1°F | Resp 16 | Ht 65.0 in | Wt 114.0 lb

## 2013-12-29 DIAGNOSIS — C159 Malignant neoplasm of esophagus, unspecified: Secondary | ICD-10-CM

## 2013-12-29 DIAGNOSIS — C12 Malignant neoplasm of pyriform sinus: Secondary | ICD-10-CM

## 2013-12-29 DIAGNOSIS — R634 Abnormal weight loss: Secondary | ICD-10-CM

## 2013-12-29 LAB — CMP (CANCER CENTER ONLY)
ALBUMIN: 3.2 g/dL — AB (ref 3.3–5.5)
ALK PHOS: 55 U/L (ref 26–84)
ALT(SGPT): 21 U/L (ref 10–47)
AST: 28 U/L (ref 11–38)
BUN, Bld: 9 mg/dL (ref 7–22)
CALCIUM: 9.2 mg/dL (ref 8.0–10.3)
CHLORIDE: 87 meq/L — AB (ref 98–108)
CO2: 33 mEq/L (ref 18–33)
CREATININE: 0.9 mg/dL (ref 0.6–1.2)
Glucose, Bld: 125 mg/dL — ABNORMAL HIGH (ref 73–118)
Potassium: 3.7 mEq/L (ref 3.3–4.7)
Sodium: 132 mEq/L (ref 128–145)
Total Bilirubin: 0.7 mg/dl (ref 0.20–1.60)
Total Protein: 7.6 g/dL (ref 6.4–8.1)

## 2013-12-29 LAB — CBC WITH DIFFERENTIAL (CANCER CENTER ONLY)
BASO#: 0 10*3/uL (ref 0.0–0.2)
BASO%: 0 % (ref 0.0–2.0)
EOS ABS: 0 10*3/uL (ref 0.0–0.5)
EOS%: 0.5 % (ref 0.0–7.0)
HEMATOCRIT: 35.7 % — AB (ref 38.7–49.9)
HEMOGLOBIN: 11.9 g/dL — AB (ref 13.0–17.1)
LYMPH#: 0.6 10*3/uL — ABNORMAL LOW (ref 0.9–3.3)
LYMPH%: 15.3 % (ref 14.0–48.0)
MCH: 28.9 pg (ref 28.0–33.4)
MCHC: 33.3 g/dL (ref 32.0–35.9)
MCV: 87 fL (ref 82–98)
MONO#: 0.5 10*3/uL (ref 0.1–0.9)
MONO%: 13.7 % — AB (ref 0.0–13.0)
NEUT#: 2.6 10*3/uL (ref 1.5–6.5)
NEUT%: 70.5 % (ref 40.0–80.0)
Platelets: 221 10*3/uL (ref 145–400)
RBC: 4.12 10*6/uL — AB (ref 4.20–5.70)
RDW: 14.3 % (ref 11.1–15.7)
WBC: 3.7 10*3/uL — ABNORMAL LOW (ref 4.0–10.0)

## 2013-12-29 MED ORDER — FENTANYL 12 MCG/HR TD PT72: 25.0000 ug | MEDICATED_PATCH | TRANSDERMAL | Status: DC

## 2013-12-29 MED ORDER — SCOPOLAMINE 1 MG/3DAYS TD PT72: 1.0000 | MEDICATED_PATCH | TRANSDERMAL | Status: DC

## 2013-12-29 MED ORDER — SODIUM CHLORIDE 0.9 % IV SOLN: INTRAVENOUS | Status: DC

## 2013-12-29 MED ORDER — SODIUM CHLORIDE 0.9 % IV SOLN
Freq: Once | INTRAVENOUS | Status: AC
  Administered 2013-12-29: 10:00:00 via INTRAVENOUS

## 2013-12-29 NOTE — Progress Notes (Signed)
Hematology and Oncology Follow Up Visit  SAAGAR TORTORELLA [id number] [birth year] 63 y.o. 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  Burleson is back for followup. He completed his treatments on April 7. He actually did pretty well. He has lost quite awake. There has a feeding tube in. Unfortunately not using it right now. He is able to swallow a little bit but only liquids. I told that he has to keep using the feeding tube. He was using it 6 times a day.  Pain is doing okay. Is on fentanyl patch for this. The pain seems to be improving. He still has some discomfort when he swallows.  He's had a little bit constipation. I think once he restarts the tube feeds, the constipation will improve.  He's had no bleeding. He's had no fever. He's had no cough. He's had no leg swelling. There's been no rashes.  Overall, his performance status is he can't 2.  Medications: Current outpatient prescriptions:Alum & Mag Hydroxide-Simeth (MAGIC MOUTHWASH W/LIDOCAINE) SOLN, Take 5 mLs by mouth 4 (four) times daily as needed for mouth pain., Disp: 250 mL, Rfl: 1;  amLODipine (NORVASC) 5 MG tablet, Take 5 mg by mouth daily., Disp: , Rfl: ;  emollient (BIAFINE) cream, Apply topically as needed., Disp: 454 g, Rfl: 0 fentaNYL (DURAGESIC - DOSED MCG/HR) 12 MCG/HR, Place 2 patches (25 mcg total) onto the skin every 3 (three) days., Disp: 20 patch, Rfl: 0;  morphine (ROXANOL) 20 MG/ML concentrated solution, Take 0.5 mLs (10 mg total) by mouth every 3 (three) hours as needed for severe pain., Disp: 60 mL, Rfl: 0;  scopolamine (TRANSDERM-SCOP) 1 MG/3DAYS, Place 1 patch (1.5 mg total) onto the skin every 3 (three) days., Disp: 10 patch, Rfl: 2 Sennosides (EX-LAX PO), Take by mouth 2 (two) times daily., Disp:  , Rfl: ;  LORazepam (ATIVAN) 0.5 MG tablet, Take 1 tablet (0.5 mg total) by mouth every 6 (six) hours as needed (Nausea or vomiting)., Disp: 30 tablet, Rfl: 0;  ondansetron (ZOFRAN) 8 MG tablet, Take 1 tablet (8 mg total) by mouth 2 (two) times daily as needed. Take two times a day as needed for nausea or vomiting starting on the third day after chemotherapy., Disp: 30 tablet, Rfl: 1 prochlorperazine (COMPAZINE) 10 MG tablet, Take 1 tablet (10 mg total) by mouth every 6 (six) hours as needed (Nausea or vomiting)., Disp: 30 tablet, Rfl: 1 Current facility-administered medications:0.9 %  sodium chloride infusion, , Intravenous, Once, [name], MD, Last Rate: 500 mL/hr at 12/29/13 1010  Allergies: No Known Allergies  Past Medical History, Surgical history, Social history, and Family History were reviewed and updated.  Review of Systems: As above  Physical Exam:  height is 5\' 5"  (1.651 m) and weight is 114 lb (51.71 kg). His oral temperature is 97.1 F (36.2 C). His blood pressure is 141/72 and his pulse is 102. His respiration is 16.   Thin Afro-American gentleman. Head and neck exam shows no skin breakdown. He has no oral lesions. He still has some pharyngeal erythema. No adenopathy is noted. No masses noted on the neck. Lungs are clear. Cardiac exam regular in rhythm. Abdomen soft. Good bowel sounds. There is no fluid wave. There is no palpable liver or spleen tip. Back exam no  tenderness over the spine ribs or hips. Extremities shows most likely upper lower extremities. Has good strength and has good range of motion of his joints. Skin exam shows some hyper pigmentation on his neck. Neurological exam is nonfocal.  Lab Results  Component Value Date   WBC 3.7* 12/29/2013   HGB 11.9* 12/29/2013   HCT 35.7* 12/29/2013   MCV 87 12/29/2013   PLT 221 12/29/2013     Chemistry      Component Value Date/Time   NA 132 12/29/2013 0916   NA 136* 10/03/2013 0755   K 3.7 12/29/2013 0916   K 4.0 10/03/2013 0755    CL 87* 12/29/2013 0916   CL 98 10/03/2013 0755   CO2 33 12/29/2013 0916   CO2 28 10/03/2013 0755   BUN 9 12/29/2013 0916   BUN 12 10/03/2013 0755   CREATININE 0.9 12/29/2013 0916   CREATININE 1.04 10/03/2013 0755      Component Value Date/Time   CALCIUM 9.2 12/29/2013 0916   CALCIUM 9.0 10/03/2013 0755   ALKPHOS 55 12/29/2013 0916   ALKPHOS 47 09/13/2013 1149   AST 28 12/29/2013 0916   AST 27 09/13/2013 1149   ALT 21 12/29/2013 0916   ALT 20 09/13/2013 1149   BILITOT 0.70 12/29/2013 0916   BILITOT 0.3 09/13/2013 1149         Impression and Plan: Mr. Toruno is a 63 year old gentleman. He has a stage II squamous cell carcinoma left piriform sinus. He completed his treatments a month ago.  I still don't believe that we have to do any scans on him for another 6 weeks or so. We still need to get him to gain some weight. We need to get more nutrition and him. I think that this will happen once he restarts his tube feeds. Thereafter we will give him some IV fluids today. I do this will help.  I refilled his vital patch.  We'll plan to see him back in about a month. When we see him back, and then we will set him up with a PET scan. I probably will not do this until the end of June.   [name], MD 5/1/201511:09 AM

## 2013-12-29 NOTE — Patient Instructions (Signed)
Dehydration, Elderly °Dehydration is when you lose more fluids from the body than you take in. Vital organs such as the kidneys, brain, and heart cannot function without a proper amount of fluids and salt. Any loss of fluids from the body can cause dehydration.  °Older adults are at a higher risk of dehydration than younger adults. As we age, our bodies are less able to conserve water and do not respond to temperature changes as well. Also, older adults do not become thirsty as easily or quickly. Because of this, older adults often do not realize they need to increase fluids to avoid dehydration.  °CAUSES  °· Vomiting. °· Diarrhea. °· Excessive sweating. °· Excessive urination. °· Fever. °· Certain medicines, such as blood pressure medicines called diuretics. °· Poorly controlled blood sugars. °SIGNS AND SYMPTOMS  °Mild dehydration: °· Thirst. °· Dry lips. °· Slightly dry mouth. °Moderate dehydration: °· Very dry mouth. °· Sunken eyes. °· Skin does not bounce back quickly when lightly pinched and released. °· Dark urine and decreased urine production. °· Decreased tear production. °· Headache. °Severe dehydration: °· Very dry mouth. °· Extreme thirst. °· Rapid, weak pulse (more than 100 beats per minute at rest). °· Cold hands and feet. °· Not able to sweat in spite of heat. °· Rapid breathing. °· Blue lips. °· Confusion and lethargy. °· Difficulty being awakened. °· Minimal urine production. °· No tears. °DIAGNOSIS  °Your health care provider will diagnose dehydration based on your symptoms and your exam. Blood and urine tests will help confirm the diagnosis. The diagnostic evaluation should also identify the cause of dehydration. °TREATMENT  °Treatment of mild or moderate dehydration can often be done at home by increasing the amount of fluids that you drink. It is best to drink small amounts of fluid more often. Drinking too much at one time can make vomiting worse. Severe dehydration needs to be treated at the  hospital. You may be given IV fluids that contain water and electrolytes. °HOME CARE INSTRUCTIONS  °· Ask your health care provider about specific rehydration instructions. °· Drink enough fluids to keep your urine clear or pale yellow. °· Drink small amounts frequently if you have nausea and vomiting. °· Eat as you normally do. °· Avoid: °· Foods or drinks high in sugar. °· Carbonated drinks. °· Juice. °· Extremely hot or cold fluids. °· Drinks with caffeine. °· Fatty, greasy foods. °· Alcohol. °· Tobacco. °· Overeating. °· Gelatin desserts. °· Wash your hands well to avoid spreading bacteria and viruses. °· Only take over-the-counter or prescription medicines for pain, discomfort, or fever as directed by your health care provider. °· Ask your health care provider if you should continue all prescribed and over-the-counter medicines. °· Keep all follow-up appointments with your health care provider. °SEEK MEDICAL CARE IF: °· You have abdominal pain, and it increases or stays in one area (localizes). °· You have a rash, stiff neck, or severe headache. °· You are irritable, sleepy, or difficult to awaken. °· You are weak, dizzy, or extremely thirsty. °SEEK IMMEDIATE MEDICAL CARE IF:  °· You are unable to keep fluids down, or you get worse despite treatment. °· You have frequent episodes of vomiting or diarrhea. °· You have blood or green matter (bile) in your vomit. °· You have blood in your stool, or your stool looks black and tarry. °· You have not urinated in 6 8 hours, or you have only urinated a small amount of very dark urine. °· You have a   fever. °· You faint. °MAKE SURE YOU:  °· Understand these instructions. °· Will watch your condition. °· Will get help right away if you are not doing well or get worse. °Document Released: 11/07/2003 Document Revised: 06/07/2013 Document Reviewed: 04/24/2013 °[employer]® Patient Information ©2014 [employer]. ° °

## 2014-01-08 ENCOUNTER — Encounter (HOSPITAL_COMMUNITY): Payer: Self-pay | Admitting: Dentistry

## 2014-01-08 ENCOUNTER — Ambulatory Visit (HOSPITAL_COMMUNITY): Payer: Medicaid - Dental | Admitting: Dentistry

## 2014-01-08 VITALS — BP 114/71 | HR 84 | Temp 98.2°F | Wt 120.0 lb

## 2014-01-08 DIAGNOSIS — R682 Dry mouth, unspecified: Secondary | ICD-10-CM

## 2014-01-08 DIAGNOSIS — C12 Malignant neoplasm of pyriform sinus: Secondary | ICD-10-CM

## 2014-01-08 DIAGNOSIS — K117 Disturbances of salivary secretion: Secondary | ICD-10-CM

## 2014-01-08 DIAGNOSIS — K08109 Complete loss of teeth, unspecified cause, unspecified class: Secondary | ICD-10-CM

## 2014-01-08 DIAGNOSIS — Z9221 Personal history of antineoplastic chemotherapy: Secondary | ICD-10-CM

## 2014-01-08 DIAGNOSIS — R1310 Dysphagia, unspecified: Secondary | ICD-10-CM

## 2014-01-08 DIAGNOSIS — Z923 Personal history of irradiation: Secondary | ICD-10-CM

## 2014-01-08 DIAGNOSIS — K0820 Unspecified atrophy of edentulous alveolar ridge: Secondary | ICD-10-CM

## 2014-01-08 DIAGNOSIS — Z0189 Encounter for other specified special examinations: Secondary | ICD-10-CM

## 2014-01-08 DIAGNOSIS — Z972 Presence of dental prosthetic device (complete) (partial): Secondary | ICD-10-CM

## 2014-01-08 DIAGNOSIS — K000 Anodontia: Secondary | ICD-10-CM

## 2014-01-08 NOTE — Progress Notes (Signed)
01/08/2014  Patient:            Collin Young Date of Birth:  03-May-1951 MRN:                [id number]  BP 114/71  Pulse 84  Temp(Src) 98.2 F (36.8 C) (Oral)  Wt 120 lb (54.432 kg)  [name] presents for periodic oral examination after radiation therapy. Patient completed all radiation treatments on 12/05/13. Patient completed 2 chemotherapy treatments  REVIEW OF CHIEF COMPLAINTS:  DRY MOUTH: Yes HARD TO SWALLOW: Yes  HURT TO SWALLOW: Yes TASTE CHANGES: Yes SORES IN MOUTH: No TRISMUS: No problems with trismus symptoms WEIGHT: 120 pounds.  HOME OH REGIMEN:  BRUSHING: Brushing his tongue daily. FLOSSING: Not applicable RINSING: Using salt water and baking soda rinses and Biotene rinses. FLUORIDE: Not applicable TRISMUS EXERCISES:  Maximum interincisal opening: 50 mm   DENTAL EXAM:  Oral Hygiene:(PLAQUE): Edentulous. Is starting to wear dentures again. LOCATION OF MUCOSITIS: None noted DESCRIPTION OF SALIVA: Incipient xerostomia ANY EXPOSED BONE: None noted OTHER WATCHED AREAS: None DX: Xerostomia, Dysgeusia, Dysphagia and Odynophagia  RECOMMENDATIONS: 1. Brush tongue daily. 2. Use trismus exercises as directed. 3. Use Biotene Rinse or salt water/baking soda rinses. 4. Multiple sips of water as needed. 5. Return to clinic in two months for start of upper and lower dentures. Will need to obtain prior approval from Medicaid. Call if problems before then.  Lenn Cal, DDS

## 2014-01-08 NOTE — Patient Instructions (Addendum)
RECOMMENDATIONS: 1. Brush tongue daily. 2. Use trismus exercises as directed. 3. Use Biotene Rinse or salt water/baking soda rinses. 4. Multiple sips of water as needed. 5. Return to clinic in two months for start of upper and lower dentures. Will need to obtain prior approval from Medicaid. Call if problems before then.  Lenn Cal, DDS   RADIATION THERAPY AND DECISIONS REGARDING YOUR TEETH  Xerostomia (dry mouth) Your salivary glands may be in the filed of radiation.  Radiation may include all or part of your saliva glands.  This will cause your saliva to dry up and you will have a dry mouth.  The dry mouth will be for the rest of your life unless your radiation oncologist tells you otherwise.  Your saliva has many functions:  Saliva wets your tongue for speaking.  It coats your teeth and the inside of your mouth for easier movement.  It helps with chewing and swallowing food.  It helps clean away harmful acid and toxic products made by the germs in your mouth, therefore it helps prevent cavities.  It kills some germs in your mouth and helps to prevent gum disease.  It helps to carry flavor to your taste buds.  Once you have lost your saliva you will be at higher risk for tooth decay and gum disease.  What can be done to help improve your mouth when there's not enough saliva:  1.  Your dentist may give a prescription for Salagen.  It will not bring back all of your saliva but may bring back some of it.  Also your saliva may be thick and ropy or white and foamy. It will not feel like it use to feel.  2.  You will need to swish with water every time your mouth feels dry.  YOU CANNOT suck on any cough drops, mints, lemon drops, candy, vitamin C or any other products.  You cannot use anything other than water to make your mouth feel less dry.  If you want to drink anything else you have to drink it all at once and brush afterwards.  Be sure to discuss the details of your  diet habits with your dentist or hygienist.  Radiation caries: This is decay that happens very quickly once your mouth is very dry due to radiation therapy.  Normally cavities take six months to two years to become a problem.  When you have dry mouth cavities may take as little as eight weeks to cause you a problem.  This is why dental check ups every two months are necessary as long as you have a dry mouth. Radiation caries typically, but not always, start at your gum line where it is hard to see the cavity.  It is therefore also hard to fill these cavities adequately.  This high rate of cavities happens because your mouth no longer has saliva and therefore the acid made by the germs starts the decay process.  Whenever you eat anything the germs in your mouth change the food into acid.  The acid then burns a small hole in your tooth.  This small hole is the beginning of a cavity.  If this is not treated then it will grow bigger and become a cavity.  The way to avoid this hole getting bigger is to use fluoride every evening as prescribed by your dentist.  You have to make sure that your teeth are very clean before you use the fluoride.  This fluoride in turn will  strengthen your teeth and prepare them for another day of fighting acid.  If you develop radiation caries many times the damage is so large that you will have to have all your teeth removed.  This could be a big problem if some of these teeth are in the field of radiation.  Further details of why this could be a big problem will follow.  (See Osteoradionecrosis).  Loss of taste (dysgeusia) This happens to varying degrees once you've had radiation therapy to your jaw region.  Many times taste is not completely lost but becomes limited.  The loss of taste is mostly due to radiation affecting your taste buds.  However if you have no saliva in your mouth to carry the flavor to your taste buds it would be difficult for your taste buds to taste anything.   That is why using water or a prescription for Salagen prior to meals and during meals may help with some of the taste.  Keep in mind that taste generally returns very slowly over the course of several months or several years after radiation therapy.  Don't give up hope.  Trismus According to your Radiation Oncologist your TMJ or jaw joints are going to be partially or fully in the field of radiation.  This means that over time the muscles that help you open and close your mouth may get stiff.  This will potentially result in your not being able to open your mouth wide enough or as wide as you can open it now.  Le me give you an example of how slowly this happens and how unaware people are of it.  A gentlemen that had radiation therapy two years ago came back to me complaining that bananas are just too large for him to be able to fit them in between his teeth.  He was not able to open wide enough to bite into a banana.  This happens slowly and over a period of time.  What do we do to try and prevent this?  Your dentist will probably give you a stack of sticks called a trismus exercise device .  This stack will help your remind your muscles and your jaw joint to open up to the same distance every day.  Use these sticks every morning when you wake up according to the instructions given by the dentist.   You must use these sticks for at least one to two years after radiation therapy.  The reason for that is because it happens so slowly and keeps going on for about two years after radiation therapy.  Your hospital dentist will help you monitor your mouth opening and make sure that it's not getting smaller.  Osteoradionecrosis (ORN) This is a condition where your jaw bone after having had radiation therapy becomes very dry.  It has very little blood supply to keep it alive.  If you develop a cavity that turns into an abscess or an infection then the jaw bone does not have enough blood supply to help fight the  infection.  At this point it is very likely that the infection could cause the death of your jaw bone.  When you have dead bone it has to be removed.  Therefore you might end up having to have surgery to remove part of your jaw bone, the part of the jaw bone that has been affected.   Healing is also a problem if you are to have surgery in the areas where the bone has  had radiation therapy.  The same reasons apply.  If you have surgery you need more blood supply which is not available.  When blood supply and oxygen are not available again, there is a chance for the bone to die.  Occasionally ORN happens on its own with no obvious reason.  This is quite rare.  We believe that patients who continue to smoke and/or drink alcohol have a higher chance of having this bone problem.  Therefore once your jaw bone has had radiation therapy if there are any teeth in that area, you should never have them pulled.  You should also never have any surgery on your teeth or gums in that area unless the oral surgeon or Periodontist is aware of your history of radiation. There is some expensive management techniques that might be used to limit your risks.  The risks for ORN either from infection or spontaneous ( or on it's own) are life long.

## 2014-01-12 ENCOUNTER — Other Ambulatory Visit: Payer: Self-pay | Admitting: *Deleted

## 2014-01-12 ENCOUNTER — Other Ambulatory Visit: Payer: Self-pay | Admitting: Radiation Oncology

## 2014-01-12 ENCOUNTER — Telehealth: Payer: Self-pay

## 2014-01-12 DIAGNOSIS — C12 Malignant neoplasm of pyriform sinus: Secondary | ICD-10-CM

## 2014-01-12 MED ORDER — FENTANYL 12 MCG/HR TD PT72: 25.0000 ug | MEDICATED_PATCH | TRANSDERMAL | Status: DC

## 2014-01-12 MED ORDER — MAGIC MOUTHWASH W/LIDOCAINE: 5.0000 mL | Freq: Four times a day (QID) | ORAL | Status: DC | PRN

## 2014-01-12 MED ORDER — SCOPOLAMINE 1 MG/3DAYS TD PT72: 1.0000 | MEDICATED_PATCH | TRANSDERMAL | Status: DC

## 2014-01-12 MED ORDER — MORPHINE SULFATE (CONCENTRATE) 20 MG/ML PO SOLN: 10.0000 mg | ORAL | Status: DC | PRN

## 2014-01-12 NOTE — Telephone Encounter (Signed)
Called patient to inform him script for magic mouthwash has been sent to pharmacy.

## 2014-01-16 ENCOUNTER — Encounter: Payer: Self-pay | Admitting: Radiation Oncology

## 2014-01-18 ENCOUNTER — Ambulatory Visit
Admission: RE | Admit: 2014-01-18 | Discharge: 2014-01-18 | Disposition: A | Discharge status: Home / Self Care | Payer: Medicaid Other | Source: Ambulatory Visit | Attending: Radiation Oncology | Admitting: Radiation Oncology

## 2014-01-18 ENCOUNTER — Encounter: Payer: Self-pay | Admitting: Radiation Oncology

## 2014-01-18 VITALS — BP 139/69 | HR 86 | Temp 98.1°F | Resp 20 | Ht 65.0 in | Wt 115.9 lb

## 2014-01-18 DIAGNOSIS — C12 Malignant neoplasm of pyriform sinus: Secondary | ICD-10-CM

## 2014-01-18 HISTORY — DX: Personal history of irradiation: Z92.3

## 2014-01-18 MED ORDER — NYSTATIN 100000 UNIT/ML MT SUSP: 5.0000 mL | Freq: Four times a day (QID) | OROMUCOSAL | Status: DC

## 2014-01-18 NOTE — Progress Notes (Signed)
Radiation Oncology         [phone] ________________________________  Name: Collin Young MRN: [id number]  Date: 01/18/2014  DOB: Feb 20, 1951  Follow-Up Visit Note  CC: ROBERTS, [name], MD  [name], MD  Diagnosis:   Squamous cell carcinoma of the piriform sinus  Interval Since Last Radiation:  6 weeks   Narrative:  The patient returns today for routine follow-up.  The patient states he has been improving. Some ongoing pain in the throat, especially when he swallows. Skin has been healing. He has been using some skin cream. He is pleased with this. The patient has been using his feeding tube more recently after seeing Dr. Marin Olp in medical oncology. He notes that he has been able to advance his diet a little, and began recently eating foods without bleeding at. Pain is under good control at this time with him on fentanyl patch and also taking liquid morphine as needed prior to meals.                              ALLERGIES:  has No Known Allergies.  Meds: Current Outpatient Prescriptions  Medication Sig Dispense Refill  . Alum & Mag Hydroxide-Simeth (MAGIC MOUTHWASH W/LIDOCAINE) SOLN Take 5 mLs by mouth 4 (four) times daily as needed for mouth pain.  250 mL  1  . emollient (BIAFINE) cream Apply topically as needed.  454 g  0  . fentaNYL (DURAGESIC - DOSED MCG/HR) 12 MCG/HR Place 2 patches (25 mcg total) onto the skin every 3 (three) days.  20 patch  0  . morphine (ROXANOL) 20 MG/ML concentrated solution Take 0.5 mLs (10 mg total) by mouth every 3 (three) hours as needed for severe pain.  60 mL  0  . scopolamine (TRANSDERM-SCOP) 1 MG/3DAYS Place 1 patch (1.5 mg total) onto the skin every 3 (three) days.  10 patch  2  . Sennosides (EX-LAX PO) Take by mouth 2 (two) times daily.      Marland Kitchen amLODipine (NORVASC) 5 MG tablet Take 5 mg by mouth daily.      Marland Kitchen LORazepam (ATIVAN) 0.5 MG tablet Take 1 tablet (0.5 mg total) by mouth every 6 (six) hours as needed (Nausea or vomiting).   30 tablet  0  . nystatin (MYCOSTATIN) 100000 UNIT/ML suspension Take 5 mLs (500,000 Units total) by mouth 4 (four) times daily.  240 mL  0  . ondansetron (ZOFRAN) 8 MG tablet Take 1 tablet (8 mg total) by mouth 2 (two) times daily as needed. Take two times a day as needed for nausea or vomiting starting on the third day after chemotherapy.  30 tablet  1  . prochlorperazine (COMPAZINE) 10 MG tablet Take 1 tablet (10 mg total) by mouth every 6 (six) hours as needed (Nausea or vomiting).  30 tablet  1   No current facility-administered medications for this encounter.    Physical Findings: The patient is in no acute distress. Patient is alert and oriented.  height is 5\' 5"  (1.651 m) and weight is 115 lb 14.4 oz (52.572 kg). His oral temperature is 98.1 F (36.7 C). His blood pressure is 139/69 and his pulse is 86. His respiration is 20 and oxygen saturation is 98%. .   Neck demonstrates hyperpigmentation and some dryness. The skin has healed well. No palpable cervical lymphadenopathy. Oral cavity demonstrates a small degree of thrush posteriorly within cavity. Otherwise clear.  Lab Findings: Lab Results  Component Value Date   WBC 3.7* 12/29/2013   HGB 11.9* 12/29/2013   HCT 35.7* 12/29/2013   MCV 87 12/29/2013   PLT 221 12/29/2013     Radiographic Findings: No results found.  Impression:    The patient is recovering I believe satisfactorily. He has been given a prescription for nystatin. He is going to see Dr. Marin Olp began in the fairly near future and he has discussed proceeding with a PET scan most likely in June.  Plan:  I will have the patient return to clinic in 2 months for ongoing followup.   Jodelle Gross, M.D., Ph.D.

## 2014-01-18 NOTE — Progress Notes (Signed)
follow up s/p radiation pyriform sinus, patient has scopolamine patch behind left ear, still with copious amounts saliva, looks like thrush on tongue, eating soft foods now not blenderized, had grits,eggs, salmon this am, 2 cans  Ensure plus via peg at lunch, flushes free water via peg ,  fentanyl patch 61mcg left shoulder,  Takes liquid morphine before eating , no c/o pain or nausea, saw Dr.Kulinski 01/08/14 Pet schedule end of June by Dr. Marin Olp last seen 12/29/13 4:05 PM

## 2014-01-19 ENCOUNTER — Other Ambulatory Visit: Payer: Self-pay | Admitting: *Deleted

## 2014-01-19 MED ORDER — BIAFINE EX EMUL
Freq: Every day | CUTANEOUS | Status: AC
  Administered 2014-01-19: 09:00:00 via TOPICAL

## 2014-01-19 NOTE — Addendum Note (Signed)
Encounter addended by: Brooks Sailors, RN on: 01/19/2014  9:13 AM<BR>     Documentation filed: Inpatient MAR, Orders

## 2014-01-19 NOTE — Addendum Note (Signed)
Encounter addended by: Brooks Sailors, RN on: 01/19/2014 10:28 AM<BR>     Documentation filed: Notes Section

## 2014-01-19 NOTE — Progress Notes (Signed)
To maintain care continuity, met with patient during 1 M follow-up appt with Dr. Lisbeth Renshaw.  I provided him an additional tube of Biafine cream, reviewed procedure for application and washing his neck.  I encouraged him to call me with any future needs/concerns.  He verbalized understanding.  Continuing to navigate as L3 (treatments completed) patient.  Gayleen Orem, RN, BSN, [facility] Ne[facility] On[facility] Navigator [phone]

## 2014-01-26 ENCOUNTER — Other Ambulatory Visit: Payer: Self-pay | Admitting: Nurse Practitioner

## 2014-01-26 ENCOUNTER — Ambulatory Visit (HOSPITAL_BASED_OUTPATIENT_CLINIC_OR_DEPARTMENT_OTHER): Payer: Medicaid Other | Admitting: Hematology & Oncology

## 2014-01-26 ENCOUNTER — Encounter: Payer: Self-pay | Admitting: Hematology & Oncology

## 2014-01-26 ENCOUNTER — Ambulatory Visit (HOSPITAL_BASED_OUTPATIENT_CLINIC_OR_DEPARTMENT_OTHER): Payer: Medicaid Other

## 2014-01-26 ENCOUNTER — Other Ambulatory Visit (HOSPITAL_BASED_OUTPATIENT_CLINIC_OR_DEPARTMENT_OTHER): Payer: Medicaid Other | Admitting: Lab

## 2014-01-26 VITALS — BP 138/60 | HR 87 | Temp 98.0°F | Resp 20 | Ht 65.0 in | Wt 115.0 lb

## 2014-01-26 DIAGNOSIS — C12 Malignant neoplasm of pyriform sinus: Secondary | ICD-10-CM

## 2014-01-26 DIAGNOSIS — Z8501 Personal history of malignant neoplasm of esophagus: Secondary | ICD-10-CM

## 2014-01-26 LAB — CBC WITH DIFFERENTIAL (CANCER CENTER ONLY)
BASO#: 0 10*3/uL (ref 0.0–0.2)
BASO%: 0.1 % (ref 0.0–2.0)
EOS ABS: 0 10*3/uL (ref 0.0–0.5)
EOS%: 0.4 % (ref 0.0–7.0)
HCT: 32.5 % — ABNORMAL LOW (ref 38.7–49.9)
HEMOGLOBIN: 10.6 g/dL — AB (ref 13.0–17.1)
LYMPH#: 0.6 10*3/uL — AB (ref 0.9–3.3)
LYMPH%: 7.8 % — ABNORMAL LOW (ref 14.0–48.0)
MCH: 29.3 pg (ref 28.0–33.4)
MCHC: 32.6 g/dL (ref 32.0–35.9)
MCV: 90 fL (ref 82–98)
MONO#: 1 10*3/uL — ABNORMAL HIGH (ref 0.1–0.9)
MONO%: 12.5 % (ref 0.0–13.0)
NEUT%: 79.2 % (ref 40.0–80.0)
NEUTROS ABS: 6.1 10*3/uL (ref 1.5–6.5)
Platelets: 239 10*3/uL (ref 145–400)
RBC: 3.62 10*6/uL — ABNORMAL LOW (ref 4.20–5.70)
RDW: 14.9 % (ref 11.1–15.7)
WBC: 7.7 10*3/uL (ref 4.0–10.0)

## 2014-01-26 LAB — CMP (CANCER CENTER ONLY)
ALBUMIN: 2.9 g/dL — AB (ref 3.3–5.5)
ALT(SGPT): 20 U/L (ref 10–47)
AST: 29 U/L (ref 11–38)
Alkaline Phosphatase: 47 U/L (ref 26–84)
BUN: 13 mg/dL (ref 7–22)
CALCIUM: 9 mg/dL (ref 8.0–10.3)
CHLORIDE: 93 meq/L — AB (ref 98–108)
CO2: 31 meq/L (ref 18–33)
Creat: 0.8 mg/dl (ref 0.6–1.2)
GLUCOSE: 122 mg/dL — AB (ref 73–118)
POTASSIUM: 3.8 meq/L (ref 3.3–4.7)
SODIUM: 134 meq/L (ref 128–145)
TOTAL PROTEIN: 7.1 g/dL (ref 6.4–8.1)
Total Bilirubin: 0.6 mg/dl (ref 0.20–1.60)

## 2014-01-26 LAB — PREALBUMIN: Prealbumin: 8.7 mg/dL — ABNORMAL LOW (ref 17.0–34.0)

## 2014-01-26 MED ORDER — SODIUM CHLORIDE 0.9 % IV SOLN
1000.0000 mL | INTRAVENOUS | Status: DC
  Administered 2014-01-26: 10:00:00 via INTRAVENOUS

## 2014-01-26 MED ORDER — SCOPOLAMINE 1 MG/3DAYS TD PT72: 1.0000 | MEDICATED_PATCH | TRANSDERMAL | Status: DC

## 2014-01-26 MED ORDER — MORPHINE SULFATE (CONCENTRATE) 20 MG/ML PO SOLN: 10.0000 mg | ORAL | Status: DC | PRN

## 2014-01-26 MED ORDER — FENTANYL 12 MCG/HR TD PT72: 25.0000 ug | MEDICATED_PATCH | TRANSDERMAL | Status: DC

## 2014-01-26 MED ORDER — SODIUM CHLORIDE 0.9 % IJ SOLN
10.0000 mL | INTRAMUSCULAR | Status: DC | PRN
  Administered 2014-01-26: 10 mL via INTRAVENOUS
  Filled 2014-01-26: qty 10

## 2014-01-26 MED ORDER — HEPARIN SOD (PORK) LOCK FLUSH 100 UNIT/ML IV SOLN
500.0000 [IU] | Freq: Once | INTRAVENOUS | Status: AC
  Administered 2014-01-26: 500 [IU] via INTRAVENOUS
  Filled 2014-01-26: qty 5

## 2014-01-26 NOTE — Progress Notes (Signed)
Hematology and Oncology Follow Up Visit  Collin Young [id number] [birth year] 63 y.o. 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  Young is is back for followup. He completed his treatments about a month or so ago. He ecstasy back up in early April. He received about 7000 rad.  He is improving slowly. He has his feeding tube and that is helping quite a bit. He is able to swallow but more right now. He is not taking as much in way of breakthrough pain medication.  He's had no diarrhea. He has had no fever. He's had a rashes. He's had no bleeding.    Medications: Current outpatient prescriptions:Alum & Mag Hydroxide-Simeth (MAGIC MOUTHWASH W/LIDOCAINE) SOLN, Take 5 mLs by mouth 4 (four) times daily as needed for mouth pain., Disp: 250 mL, Rfl: 1;  emollient (BIAFINE) cream, Apply topically as needed., Disp: 454 g, Rfl: 0;  Nutritional Supplements (BOOST HIGH PROTEIN PO), Take by mouth 6 (six) times daily., Disp: , Rfl:  nystatin (MYCOSTATIN) 100000 UNIT/ML suspension, Take 5 mLs (500,000 Units total) by mouth 4 (four) times daily., Disp: 240 mL, Rfl: 0;  Sennosides (EX-LAX PO), Take by mouth 2 (two) times daily., Disp: , Rfl: ;  amLODipine (NORVASC) 5 MG tablet, Take 5 mg by mouth. NOT TAKING, Disp: , Rfl: ;  fentaNYL (DURAGESIC - DOSED MCG/HR) 12 MCG/HR, Place 2 patches (25 mcg total) onto the skin every 3 (three) days., Disp: 20 patch, Rfl: 0 LORazepam (ATIVAN) 0.5 MG tablet, Take 0.5 mg by mouth. NOT TAKING, Disp: , Rfl: ;  morphine (ROXANOL) 20 MG/ML concentrated solution, Take 0.5 mLs (10 mg total) by mouth every 3 (three) hours as needed for severe pain., Disp: 60 mL, Rfl: 0;  ondansetron (ZOFRAN) 8 MG tablet, Take 8 mg by mouth. NOT TAKING, Disp: , Rfl: ;   prochlorperazine (COMPAZINE) 10 MG tablet, Take by mouth. NOT  TAKING, Disp: , Rfl:  scopolamine (TRANSDERM-SCOP) 1 MG/3DAYS, Place 1 patch (1.5 mg total) onto the skin every 3 (three) days., Disp: 10 patch, Rfl: 2 No current facility-administered medications for this visit. Facility-Administered Medications Ordered in Other Visits: topical emolient (BIAFINE) emulsion, , Topical, Daily, Collin Round, MD  Allergies: No Known Allergies  Past Medical History, Surgical history, Social history, and Family History were reviewed and updated.  Review of Systems: As above  Physical Exam:  height is 5\' 5"  (1.651 m) and weight is 115 lb (52.164 kg). His oral temperature is 98 F (36.7 C). His blood pressure is 138/60 and his pulse is 87. His respiration is 20.   Thin but well-nourished African American gentleman. His head and neck exam shows no ocular or oral lesions. There is no mucositis. There is no adenopathy on his neck. His lungs are clear. Cardiac exam regular rate and rhythm with no murmurs rubs or bruits. Abdomen is soft. He has his feeding tube is intact. He has no obvious abdominal mass. He is no palpable liver or spleen tip. Extremities shows no clubbing cyanosis or edema. Neurological exam shows no focal neurological deficits. Skin exam shows no rashes ecchymoses or petechia.  Lab Results  Component Value Date   WBC 3.7* 12/29/2013   HGB 11.9* 12/29/2013   HCT 35.7* 12/29/2013   MCV 87 12/29/2013  PLT 221 12/29/2013     Chemistry      Component Value Date/Time   NA 132 12/29/2013 0916   NA 136* 10/03/2013 0755   K 3.7 12/29/2013 0916   K 4.0 10/03/2013 0755   CL 87* 12/29/2013 0916   CL 98 10/03/2013 0755   CO2 33 12/29/2013 0916   CO2 28 10/03/2013 0755   BUN 9 12/29/2013 0916   BUN 12 10/03/2013 0755   CREATININE 0.9 12/29/2013 0916   CREATININE 1.04 10/03/2013 0755      Component Value Date/Time   CALCIUM 9.2 12/29/2013 0916   CALCIUM 9.0 10/03/2013 0755   ALKPHOS 55 12/29/2013 0916   ALKPHOS 47  09/13/2013 1149   AST 28 12/29/2013 0916   AST 27 09/13/2013 1149   ALT 21 12/29/2013 0916   ALT 20 09/13/2013 1149   BILITOT 0.70 12/29/2013 0916   BILITOT 0.3 09/13/2013 1149         Impression and Plan: Collin Young is 63 year old gentleman. He has a second primary. This was of the left. Form signed. He received curative radiation chemotherapy.  Has now been 2 months since his had his radiation chemotherapy. He is recovering. Also give him IV fluids. He will get somewhat today.  We will go ahead and plan for a PET scan on him. We will get this set up in about 3 or 4 weeks.  He plans on going down to [place] in about 3 weeks. I don't see any problems with this.  We will plan to get him back afterwards.   [name], MD 5/29/20159:21 AM

## 2014-01-26 NOTE — Patient Instructions (Signed)
Dehydration, Adult  Dehydration means your body does not have as much fluid as it needs. Your kidneys, brain, and heart will not work properly without the right amount of fluids and salt.   HOME CARE   Ask your doctor how to replace body fluid losses (rehydrate).   Drink enough fluids to keep your pee (urine) clear or pale yellow.   Drink small amounts of fluids often if you feel sick to your stomach (nauseous) or throw up (vomit).   Eat like you normally do.   Avoid:   Foods or drinks high in sugar.   Bubbly (carbonated) drinks.   Juice.   Very hot or cold fluids.   Drinks with caffeine.   Fatty, greasy foods.   Alcohol.   Tobacco.   Eating too much.   Gelatin desserts.   Wash your hands to avoid spreading germs (bacteria, viruses).   Only take medicine as told by your doctor.   Keep all doctor visits as told.  GET HELP RIGHT AWAY IF:    You cannot drink something without throwing up.   You get worse even with treatment.   Your vomit has blood in it or looks greenish.   Your poop (stool) has blood in it or looks black and tarry.   You have not peed in 6 to 8 hours.   You pee a small amount of very dark pee.   You have a fever.   You pass out (faint).   You have belly (abdominal) pain that gets worse or stays in one spot (localizes).   You have a rash, stiff neck, or bad headache.   You get easily annoyed, sleepy, or are hard to wake up.   You feel weak, dizzy, or very thirsty.  MAKE SURE YOU:    Understand these instructions.   Will watch your condition.   Will get help right away if you are not doing well or get worse.  Document Released: 06/13/2009 Document Revised: 11/09/2011 Document Reviewed: 04/06/2011  [employer] Patient Information 2014 [employer].

## 2014-01-28 ENCOUNTER — Emergency Department (HOSPITAL_COMMUNITY)
Admission: EM | Admit: 2014-01-28 | Discharge: 2014-01-28 | Disposition: A | Discharge status: Home / Self Care | Payer: Medicaid Other | Attending: Emergency Medicine | Admitting: Emergency Medicine

## 2014-01-28 ENCOUNTER — Encounter (HOSPITAL_COMMUNITY): Payer: Self-pay | Admitting: Emergency Medicine

## 2014-01-28 DIAGNOSIS — R5383 Other fatigue: Secondary | ICD-10-CM

## 2014-01-28 DIAGNOSIS — E860 Dehydration: Secondary | ICD-10-CM | POA: Insufficient documentation

## 2014-01-28 DIAGNOSIS — I10 Essential (primary) hypertension: Secondary | ICD-10-CM | POA: Insufficient documentation

## 2014-01-28 DIAGNOSIS — Z931 Gastrostomy status: Secondary | ICD-10-CM | POA: Insufficient documentation

## 2014-01-28 DIAGNOSIS — R350 Frequency of micturition: Secondary | ICD-10-CM | POA: Insufficient documentation

## 2014-01-28 DIAGNOSIS — Z8522 Personal history of malignant neoplasm of nasal cavities, middle ear, and accessory sinuses: Secondary | ICD-10-CM | POA: Insufficient documentation

## 2014-01-28 DIAGNOSIS — G40909 Epilepsy, unspecified, not intractable, without status epilepticus: Secondary | ICD-10-CM | POA: Insufficient documentation

## 2014-01-28 DIAGNOSIS — R519 Headache, unspecified: Secondary | ICD-10-CM

## 2014-01-28 DIAGNOSIS — R110 Nausea: Secondary | ICD-10-CM

## 2014-01-28 DIAGNOSIS — R5381 Other malaise: Secondary | ICD-10-CM

## 2014-01-28 DIAGNOSIS — Z8501 Personal history of malignant neoplasm of esophagus: Secondary | ICD-10-CM | POA: Insufficient documentation

## 2014-01-28 DIAGNOSIS — K219 Gastro-esophageal reflux disease without esophagitis: Secondary | ICD-10-CM | POA: Insufficient documentation

## 2014-01-28 DIAGNOSIS — R51 Headache: Secondary | ICD-10-CM | POA: Insufficient documentation

## 2014-01-28 DIAGNOSIS — Z79899 Other long term (current) drug therapy: Secondary | ICD-10-CM | POA: Insufficient documentation

## 2014-01-28 DIAGNOSIS — R300 Dysuria: Secondary | ICD-10-CM

## 2014-01-28 DIAGNOSIS — E871 Hypo-osmolality and hyponatremia: Secondary | ICD-10-CM | POA: Insufficient documentation

## 2014-01-28 DIAGNOSIS — Z923 Personal history of irradiation: Secondary | ICD-10-CM | POA: Insufficient documentation

## 2014-01-28 DIAGNOSIS — E785 Hyperlipidemia, unspecified: Secondary | ICD-10-CM | POA: Insufficient documentation

## 2014-01-28 DIAGNOSIS — Z87891 Personal history of nicotine dependence: Secondary | ICD-10-CM | POA: Insufficient documentation

## 2014-01-28 LAB — URINALYSIS, ROUTINE W REFLEX MICROSCOPIC
Bilirubin Urine: NEGATIVE
GLUCOSE, UA: NEGATIVE mg/dL
Hgb urine dipstick: NEGATIVE
Ketones, ur: NEGATIVE mg/dL
LEUKOCYTES UA: NEGATIVE
NITRITE: NEGATIVE
Protein, ur: NEGATIVE mg/dL
SPECIFIC GRAVITY, URINE: 1.016 (ref 1.005–1.030)
Urobilinogen, UA: 1 mg/dL (ref 0.0–1.0)
pH: 8 (ref 5.0–8.0)

## 2014-01-28 LAB — CBC WITH DIFFERENTIAL/PLATELET
BASOS ABS: 0 10*3/uL (ref 0.0–0.1)
BASOS PCT: 0 % (ref 0–1)
EOS ABS: 0.1 10*3/uL (ref 0.0–0.7)
Eosinophils Relative: 1 % (ref 0–5)
HCT: 35 % — ABNORMAL LOW (ref 39.0–52.0)
Hemoglobin: 11.4 g/dL — ABNORMAL LOW (ref 13.0–17.0)
Lymphocytes Relative: 17 % (ref 12–46)
Lymphs Abs: 1 10*3/uL (ref 0.7–4.0)
MCH: 28.3 pg (ref 26.0–34.0)
MCHC: 32.6 g/dL (ref 30.0–36.0)
MCV: 86.8 fL (ref 78.0–100.0)
Monocytes Absolute: 1 10*3/uL (ref 0.1–1.0)
Monocytes Relative: 18 % — ABNORMAL HIGH (ref 3–12)
NEUTROS PCT: 64 % (ref 43–77)
Neutro Abs: 3.7 10*3/uL (ref 1.7–7.7)
PLATELETS: 238 10*3/uL (ref 150–400)
RBC: 4.03 MIL/uL — ABNORMAL LOW (ref 4.22–5.81)
RDW: 14.7 % (ref 11.5–15.5)
WBC: 5.8 10*3/uL (ref 4.0–10.5)

## 2014-01-28 LAB — COMPREHENSIVE METABOLIC PANEL
ALBUMIN: 2.9 g/dL — AB (ref 3.5–5.2)
ALT: 13 U/L (ref 0–53)
AST: 20 U/L (ref 0–37)
Alkaline Phosphatase: 49 U/L (ref 39–117)
BUN: 14 mg/dL (ref 6–23)
CALCIUM: 8.9 mg/dL (ref 8.4–10.5)
CO2: 28 mEq/L (ref 19–32)
Chloride: 90 mEq/L — ABNORMAL LOW (ref 96–112)
Creatinine, Ser: 0.63 mg/dL (ref 0.50–1.35)
GFR calc non Af Amer: >90 mL/min (ref >90)
GLUCOSE: 89 mg/dL (ref 70–99)
POTASSIUM: 4 meq/L (ref 3.7–5.3)
Sodium: 128 mEq/L — ABNORMAL LOW (ref 137–147)
TOTAL PROTEIN: 6.8 g/dL (ref 6.0–8.3)
Total Bilirubin: 0.3 mg/dL (ref 0.3–1.2)

## 2014-01-28 MED ORDER — ONDANSETRON HCL 4 MG/2ML IJ SOLN
4.0000 mg | Freq: Once | INTRAMUSCULAR | Status: AC
  Administered 2014-01-28: 4 mg via INTRAVENOUS
  Filled 2014-01-28: qty 2

## 2014-01-28 MED ORDER — SODIUM CHLORIDE 0.9 % IV BOLUS (SEPSIS)
1000.0000 mL | Freq: Once | INTRAVENOUS | Status: AC
  Administered 2014-01-28: 1000 mL via INTRAVENOUS

## 2014-01-28 MED ORDER — HEPARIN SOD (PORK) LOCK FLUSH 100 UNIT/ML IV SOLN
500.0000 [IU] | Freq: Once | INTRAVENOUS | Status: AC
  Administered 2014-01-28: 500 [IU]
  Filled 2014-01-28: qty 5

## 2014-01-28 NOTE — ED Provider Notes (Signed)
CSN: [id number]     Arrival date & time 01/28/14  1013 History   First MD Initiated Contact with Patient 01/28/14 1105     Chief Complaint  Patient presents with  . Headache  . Urinary Frequency    HPI  Collin Young is a 63 y.o. 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 History was provided by the patient. Patient states he received unknown fluids (1L normal saline per note) on 01/26/14 two days ago at his follow-up oncology appointment with Dr. Marin Olp and has not felt well since. He complains of generalized weakness and fatigue, intermittent headaches and nausea with no emesis. Chronically has increased secretions and difficulty swallowing due to his condition. He also complains of dysuria. No testicular pain, penile discharge, difficulty with urination, abdominal pain, or back pain. He also complains of an intermittent sharp pain on his left head, but denies any headache currently. No confusion, focal weakness, loss of sensation, vision changes, photophobia, neck pain, fever, slurred speech, or ataxia. He has not done anything to treat his symptoms. He has not called his oncologist regarding his symptoms. Patient has PEG tube which he uses for supplemental nutrition.  Dr. Lisbeth Renshaw - radiation oncologist Dr. Marin Olp - oncologist   Past Medical History  Diagnosis Date  . Hypertension   . Hyperlipemia   . HOH (hard of hearing)   . Seizures     alcohol withdrawls- 2001  . GERD (gastroesophageal reflux disease)     takes zantac prn  . Cancer     Esophagus- radiation 2011  . History of radiation therapy 10/19/2013-12/05/2013    pyriform sinus 69.96 Gy/79fx   Past Surgical History  Procedure Laterality Date  . No past surgeries    . Cardiovascular stress test  10/09/09    normal nuclearr  stress test, EF 57% [name])  . Laryngoscopy N/A 09/15/2013    Procedure: LARYNGOSCOPY;  Surgeon: [name], MD;  [facility]: [facility] OR;  Service: ENT;  Laterality: N/A;  direct laryngoscopy with biopsy and esophagoscopy   History reviewed. No pertinent family history. History  Substance Use Topics  . Smoking status: Former Smoker -- 1.00 packs/day for 40 years    Types: Cigarettes    Start date: 11/08/1960    Quit date: 09/01/1999  . Smokeless tobacco: Former Systems developer    Types: [place] date: 09/01/1999     Comment: quit in 2011  . Alcohol Use: No     Comment: quit in 2001    Review of Systems  Constitutional: Positive for fatigue. Negative for fever, chills, activity change and appetite change.  HENT: Positive for trouble swallowing (at baseline). Negative for congestion, rhinorrhea and sore throat.   Eyes: Negative for photophobia and visual disturbance.  Respiratory: Negative for cough and shortness of breath.   Cardiovascular: Negative for chest pain and leg swelling.  Gastrointestinal: Positive for nausea. Negative for vomiting, abdominal pain, diarrhea and constipation.  Genitourinary: Positive for dysuria and frequency. Negative for urgency, hematuria, penile swelling, scrotal swelling, difficulty urinating, penile pain and testicular pain.  Musculoskeletal: Negative for back pain, myalgias and neck pain.  Skin: Negative for rash.  Neurological: Positive for weakness (generalized) and headaches (intermittent). Negative for dizziness, facial asymmetry, speech difficulty, light-headedness and numbness.    Allergies  Review of patient's  allergies indicates no known allergies.  Home Medications   Prior to Admission medications   Medication Sig Start Date End Date Taking? Authorizing Provider  Alum & Mag Hydroxide-Simeth (MAGIC MOUTHWASH W/LIDOCAINE) SOLN Take 5 mLs by mouth 4 (four) times daily as needed for mouth pain. 01/12/14  Yes Marye Round, MD  emollient (BIAFINE)  cream Apply topically as needed. 12/05/13   Thea Silversmith, MD  fentaNYL (DURAGESIC - DOSED MCG/HR) 12 MCG/HR Place 2 patches (25 mcg total) onto the skin every 3 (three) days. 01/26/14   [name], MD  LORazepam (ATIVAN) 0.5 MG tablet Take 0.5 mg by mouth. NOT TAKING 10/05/13   [name], MD  morphine (ROXANOL) 20 MG/ML concentrated solution Take 0.5 mLs (10 mg total) by mouth every 3 (three) hours as needed for severe pain. 01/26/14   [name], MD  Nutritional Supplements (BOOST HIGH PROTEIN PO) Take by mouth 6 (six) times daily.    Historical Provider, MD  nystatin (MYCOSTATIN) 100000 UNIT/ML suspension Take 5 mLs (500,000 Units total) by mouth 4 (four) times daily. 01/18/14   Marye Round, MD  ondansetron (ZOFRAN) 8 MG tablet Take 8 mg by mouth. NOT TAKING 10/05/13   [name], MD  prochlorperazine (COMPAZINE) 10 MG tablet Take by mouth. NOT  TAKING 10/05/13   [name], MD  scopolamine (TRANSDERM-SCOP) 1 MG/3DAYS Place 1 patch (1.5 mg total) onto the skin every 3 (three) days. 01/26/14   [name], MD  Sennosides (EX-LAX PO) Take by mouth 2 (two) times daily.    Historical Provider, MD   BP 141/83  Pulse 84  Temp(Src) 98.4 F (36.9 C) (Oral)  Resp 18  SpO2 100%  Filed Vitals:   01/28/14 1422 01/28/14 1438 01/28/14 1443 01/28/14 1444  BP: 157/87 157/85 156/82 152/81  Pulse: 82 80 89 86  Temp:      TempSrc:      Resp: 16 16 16 16   SpO2: 98% 96% 99% 97%    Physical Exam  Nursing note and vitals reviewed. Constitutional: He is oriented to person, place, and time. No distress.  Thin male  HENT:  Head: Normocephalic and atraumatic.  Right Ear: External ear normal.  Left Ear: External ear normal.  Nose: Nose normal.  Mouth/Throat: Oropharynx is clear and moist. No oropharyngeal exudate.  Eyes: Conjunctivae and EOM are normal. Pupils are equal, round, and reactive to light. Right eye exhibits no discharge. Left eye exhibits no discharge.  Neck: Normal  range of motion. Neck supple.  No cervical lymphadenopathy. No nuchal rigidity.   Cardiovascular: Normal rate, regular rhythm, normal heart sounds and intact distal pulses.  Exam reveals no gallop and no friction rub.   No murmur heard. Pulmonary/Chest: Effort normal and breath sounds normal. No respiratory distress. He has no wheezes. He has no rales. He exhibits no tenderness.  Abdominal: Soft. Bowel sounds are normal. He exhibits no distension and no mass. There is no tenderness. There is no rebound and no guarding.  PEG tube in place with no surrounding erythema or drainage  Musculoskeletal: Normal range of motion. He exhibits no edema and no tenderness.  Strength 5/5 in the upper and lower extremities bilaterally.   Neurological: He is alert and oriented to person, place, and time.  GCS 15. No focal neurological deficits. CN 2-12 intact.  No pronator drift. Finger to nose intact. Heel to shin intact.    Skin: Skin is warm and dry. He is not diaphoretic.  ED Course  Procedures (including critical care time) Labs Review Labs Reviewed - No data to display  Imaging Review No results found.   EKG Interpretation None      Results for orders placed during the hospital encounter of 01/28/14  CBC WITH DIFFERENTIAL      Result Value Ref Range   WBC 5.8  4.0 - 10.5 K/uL   RBC 4.03 (*) 4.22 - 5.81 MIL/uL   Hemoglobin 11.4 (*) 13.0 - 17.0 g/dL   HCT 35.0 (*) 39.0 - 52.0 %   MCV 86.8  78.0 - 100.0 fL   MCH 28.3  26.0 - 34.0 pg   MCHC 32.6  30.0 - 36.0 g/dL   RDW 14.7  11.5 - 15.5 %   Platelets 238  150 - 400 K/uL   Neutrophils Relative % 64  43 - 77 %   Neutro Abs 3.7  1.7 - 7.7 K/uL   Lymphocytes Relative 17  12 - 46 %   Lymphs Abs 1.0  0.7 - 4.0 K/uL   Monocytes Relative 18 (*) 3 - 12 %   Monocytes Absolute 1.0  0.1 - 1.0 K/uL   Eosinophils Relative 1  0 - 5 %   Eosinophils Absolute 0.1  0.0 - 0.7 K/uL   Basophils Relative 0  0 - 1 %   Basophils Absolute 0.0  0.0 - 0.1  K/uL  COMPREHENSIVE METABOLIC PANEL      Result Value Ref Range   Sodium 128 (*) 137 - 147 mEq/L   Potassium 4.0  3.7 - 5.3 mEq/L   Chloride 90 (*) 96 - 112 mEq/L   CO2 28  19 - 32 mEq/L   Glucose, Bld 89  70 - 99 mg/dL   BUN 14  6 - 23 mg/dL   Creatinine, Ser 0.63  0.50 - 1.35 mg/dL   Calcium 8.9  8.4 - 10.5 mg/dL   Total Protein 6.8  6.0 - 8.3 g/dL   Albumin 2.9 (*) 3.5 - 5.2 g/dL   AST 20  0 - 37 U/L   ALT 13  0 - 53 U/L   Alkaline Phosphatase 49  39 - 117 U/L   Total Bilirubin 0.3  0.3 - 1.2 mg/dL   GFR calc non Af Amer >90  >90 mL/min   GFR calc Af Amer >90  >90 mL/min  URINALYSIS, ROUTINE W REFLEX MICROSCOPIC      Result Value Ref Range   Color, Urine YELLOW  YELLOW   APPearance CLEAR  CLEAR   Specific Gravity, Urine 1.016  1.005 - 1.030   pH 8.0  5.0 - 8.0   Glucose, UA NEGATIVE  NEGATIVE mg/dL   Hgb urine dipstick NEGATIVE  NEGATIVE   Bilirubin Urine NEGATIVE  NEGATIVE   Ketones, ur NEGATIVE  NEGATIVE mg/dL   Protein, ur NEGATIVE  NEGATIVE mg/dL   Urobilinogen, UA 1.0  0.0 - 1.0 mg/dL   Nitrite NEGATIVE  NEGATIVE   Leukocytes, UA NEGATIVE  NEGATIVE     MDM   DARALD UZZLE is a 63 y.o. 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 Patient complained of intermittent headaches however did not have a headache throughout his ED visit. No focal neurological deficits. Did not feel patient needed head CT at this time. Patient also likely dehydrated and was  given IV fluids with overall improvements in his contrition. Orthostatic vitals negative. Labs revealed hyponatremia (128) and low chloride (90), which has been present in the past. Labs otherwise unremarkable. Patient afebrile and non-toxic. UA not suggestive of a UTI. Oncology consulted and states patient can follow-up in clinic  with his doctor. Patient mildly hypertensive in the 767'H systolic. Patient in agreement with discharge and close follow-up. Return precautions, discharge instructions, and follow-up was discussed with the patient before discharge.    Rechecks  3:00 PM = Patient asymptomatic. No headache or nausea. Overall feels better after fluids.   Consults  2:30 PM = Spoke with Dr. Letta Pate who states to check orthostatic vitals and if they are normal he can follow-up with Dr. Marin Olp tomorrow in clinic.      Discharge Medication List as of 01/28/2014  3:01 PM      Final impressions: 1. Nausea   2. Headache   3. Dysuria   4. Malaise and fatigue   5. Hyponatremia   6. Dehydration      Mercy Moore PA-C   This patient was discussed with Dr. Nonda Lou, PA-C 01/29/14 1251

## 2014-01-28 NOTE — Discharge Instructions (Signed)
Drink fluids and rest  Return to the emergency department if you develop any changing/worsening condition, severe headache, weakness, confusion, abdominal pain, repeated vomiting, fever, or any other concerns (please read additional information regarding your condition below)    Migraine Headache A migraine headache is an intense, throbbing pain on one or both sides of your head. A migraine can last for 30 minutes to several hours. CAUSES  The exact cause of a migraine headache is not always known. However, a migraine may be caused when nerves in the brain become irritated and release chemicals that cause inflammation. This causes pain. Certain things may also trigger migraines, such as:  Alcohol.  Smoking.  Stress.  Menstruation.  Aged cheeses.  Foods or drinks that contain nitrates, glutamate, aspartame, or tyramine.  Lack of sleep.  Chocolate.  Caffeine.  Hunger.  Physical exertion.  Fatigue.  Medicines used to treat chest pain (nitroglycerine), birth control pills, estrogen, and some blood pressure medicines. SIGNS AND SYMPTOMS  Pain on one or both sides of your head.  Pulsating or throbbing pain.  Severe pain that prevents daily activities.  Pain that is aggravated by any physical activity.  Nausea, vomiting, or both.  Dizziness.  Pain with exposure to bright lights, loud noises, or activity.  General sensitivity to bright lights, loud noises, or smells. Before you get a migraine, you may get warning signs that a migraine is coming (aura). An aura may include:  Seeing flashing lights.  Seeing bright spots, halos, or zig-zag lines.  Having tunnel vision or blurred vision.  Having feelings of numbness or tingling.  Having trouble talking.  Having muscle weakness. DIAGNOSIS  A migraine headache is often diagnosed based on:  Symptoms.  Physical exam.  A CT scan or MRI of your head. These imaging tests cannot diagnose migraines, but they can  help rule out other causes of headaches. TREATMENT Medicines may be given for pain and nausea. Medicines can also be given to help prevent recurrent migraines.  HOME CARE INSTRUCTIONS  Only take over-the-counter or prescription medicines for pain or discomfort as directed by your health care provider. The use of long-term narcotics is not recommended.  Lie down in a dark, quiet room when you have a migraine.  Keep a journal to find out what may trigger your migraine headaches. For example, write down:  What you eat and drink.  How much sleep you get.  Any change to your diet or medicines.  Limit alcohol consumption.  Quit smoking if you smoke.  Get 7 9 hours of sleep, or as recommended by your health care provider.  Limit stress.  Keep lights dim if bright lights bother you and make your migraines worse. SEEK IMMEDIATE MEDICAL CARE IF:   Your migraine becomes severe.  You have a fever.  You have a stiff neck.  You have vision loss.  You have muscular weakness or loss of muscle control.  You start losing your balance or have trouble walking.  You feel faint or pass out.  You have severe symptoms that are different from your first symptoms. MAKE SURE YOU:   Understand these instructions.  Will watch your condition.  Will get help right away if you are not doing well or get worse. Document Released: 08/17/2005 Document Revised: 06/07/2013 Document Reviewed: 04/24/2013 [facility] Patient Information 2014 [place].  Dysuria Dysuria is the medical term for pain with urination. There are many causes for dysuria, but urinary tract infection is the most common. If a urinalysis  was performed it can show that there is a urinary tract infection. A urine culture confirms that you or your child is sick. You will need to follow up with a healthcare provider because:  If a urine culture was done you will need to know the culture results and treatment  recommendations.  If the urine culture was positive, you or your child will need to be put on antibiotics or know if the antibiotics prescribed are the right antibiotics for your urinary tract infection.  If the urine culture is negative (no urinary tract infection), then other causes may need to be explored or antibiotics need to be stopped. Today laboratory work may have been done and there does not seem to be an infection. If cultures were done they will take at least 24 to 48 hours to be completed. Today x-rays may have been taken and they read as normal. No cause can be found for the problems. The x-rays may be re-read by a radiologist and you will be contacted if additional findings are made. You or your child may have been put on medications to help with this problem until you can see your primary caregiver. If the problems get better, see your primary caregiver if the problems return. If you were given antibiotics (medications which kill germs), take all of the mediations as directed for the full course of treatment.  If laboratory work was done, you need to find the results. Leave a telephone number where you can be reached. If this is not possible, make sure you find out how you are to get test results. HOME CARE INSTRUCTIONS   Drink lots of fluids. For adults, drink eight, 8 ounce glasses of clear juice or water a day. For children, replace fluids as suggested by your caregiver.  Empty the bladder often. Avoid holding urine for long periods of time.  After a bowel movement, women should cleanse front to back, using each tissue only once.  Empty your bladder before and after sexual intercourse.  Take all the medicine given to you until it is gone. You may feel better in a few days, but TAKE ALL MEDICINE.  Avoid caffeine, tea, alcohol and carbonated beverages, because they tend to irritate the bladder.  In men, alcohol may irritate the prostate.  Only take over-the-counter or  prescription medicines for pain, discomfort, or fever as directed by your caregiver.  If your caregiver has given you a follow-up appointment, it is very important to keep that appointment. Not keeping the appointment could result in a chronic or permanent injury, pain, and disability. If there is any problem keeping the appointment, you must call back to this facility for assistance. SEEK IMMEDIATE MEDICAL CARE IF:   Back pain develops.  A fever develops.  There is nausea (feeling sick to your stomach) or vomiting (throwing up).  Problems are no better with medications or are getting worse. MAKE SURE YOU:   Understand these instructions.  Will watch your condition.  Will get help right away if you are not doing well or get worse. Document Released: 05/15/2004 Document Revised: 11/09/2011 Document Reviewed: 03/22/2008 [facility] Patient Information 2014 [employer] [place].  Fatigue Fatigue is a feeling of tiredness, lack of energy, lack of motivation, or feeling tired all the time. Having enough rest, good nutrition, and reducing stress will normally reduce fatigue. Consult your caregiver if it persists. The nature of your fatigue will help your caregiver to find out its cause. The treatment is based on the cause.  CAUSES  There are many causes for fatigue. Most of the time, fatigue can be traced to one or more of your habits or routines. Most causes fit into one or more of three general areas. They are: Lifestyle problems  Sleep disturbances.  Overwork.  Physical exertion.  Unhealthy habits.  Poor eating habits or eating disorders.  Alcohol and/or drug use .  Lack of proper nutrition (malnutrition). Psychological problems  Stress and/or anxiety problems.  Depression.  Grief.  Boredom. Medical Problems or Conditions  Anemia.  Pregnancy.  Thyroid gland problems.  Recovery from major surgery.  Continuous pain.  Emphysema or asthma that is not well  controlled  Allergic conditions.  Diabetes.  Infections (such as mononucleosis).  Obesity.  Sleep disorders, such as sleep apnea.  Heart failure or other heart-related problems.  Cancer.  Kidney disease.  Liver disease.  Effects of certain medicines such as antihistamines, cough and cold remedies, prescription pain medicines, heart and blood pressure medicines, drugs used for treatment of cancer, and some antidepressants. SYMPTOMS  The symptoms of fatigue include:   Lack of energy.  Lack of drive (motivation).  Drowsiness.  Feeling of indifference to the surroundings. DIAGNOSIS  The details of how you feel help guide your caregiver in finding out what is causing the fatigue. You will be asked about your present and past health condition. It is important to review all medicines that you take, including prescription and non-prescription items. A thorough exam will be done. You will be questioned about your feelings, habits, and normal lifestyle. Your caregiver may suggest blood tests, urine tests, or other tests to look for common medical causes of fatigue.  TREATMENT  Fatigue is treated by correcting the underlying cause. For example, if you have continuous pain or depression, treating these causes will improve how you feel. Similarly, adjusting the dose of certain medicines will help in reducing fatigue.  HOME CARE INSTRUCTIONS   Try to get the required amount of good sleep every night.  Eat a healthy and nutritious diet, and drink enough water throughout the day.  Practice ways of relaxing (including yoga or meditation).  Exercise regularly.  Make plans to change situations that cause stress. Act on those plans so that stresses decrease over time. Keep your work and personal routine reasonable.  Avoid street drugs and minimize use of alcohol.  Start taking a daily multivitamin after consulting your caregiver. SEEK MEDICAL CARE IF:   You have persistent tiredness,  which cannot be accounted for.  You have fever.  You have unintentional weight loss.  You have headaches.  You have disturbed sleep throughout the night.  You are feeling sad.  You have constipation.  You have dry skin.  You have gained weight.  You are taking any new or different medicines that you suspect are causing fatigue.  You are unable to sleep at night.  You develop any unusual swelling of your legs or other parts of your body. SEEK IMMEDIATE MEDICAL CARE IF:   You are feeling confused.  Your vision is blurred.  You feel faint or pass out.  You develop severe headache.  You develop severe abdominal, pelvic, or back pain.  You develop chest pain, shortness of breath, or an irregular or fast heartbeat.  You are unable to pass a normal amount of urine.  You develop abnormal bleeding such as bleeding from the rectum or you vomit blood.  You have thoughts about harming yourself or committing suicide.  You are worried that you  might harm someone else. MAKE SURE YOU:   Understand these instructions.  Will watch your condition.  Will get help right away if you are not doing well or get worse. Document Released: 06/14/2007 Document Revised: 11/09/2011 Document Reviewed: 06/14/2007 [facility] Patient Information 2014 [place].  Hyponatremia  Hyponatremia is when the amount of salt (sodium) in your blood is too low. When sodium levels are low, your cells will absorb extra water and swell. The swelling happens throughout the body, but it mostly affects the brain. Severe brain swelling (cerebral edema), seizures, or coma can happen.  CAUSES   Heart, kidney, or liver problems.  Thyroid problems.  Adrenal gland problems.  Severe vomiting and diarrhea.  Certain medicines or illegal drugs.  Dehydration.  Drinking too much water.  Low-sodium diet. SYMPTOMS   Nausea and vomiting.  Confusion.  Lethargy.  Agitation.  Headache.  Twitching  or shaking (seizures).  Unconsciousness.  Appetite loss.  Muscle weakness and cramping. DIAGNOSIS  Hyponatremia is identified by a simple blood test. Your caregiver will perform a history and physical exam to try to find the cause and type of hyponatremia. Other tests may be needed to measure the amount of sodium in your blood and urine. TREATMENT  Treatment will depend on the cause.   Fluids may be given through the vein (IV).  Medicines may be used to correct the sodium imbalance. If medicines are causing the problem, they will need to be adjusted.  Water or fluid intake may be restricted to restore proper balance. The speed of correcting the sodium problem is very important. If the problem is corrected too fast, nerve damage (sometimes unchangeable) can happen. HOME CARE INSTRUCTIONS   Only take medicines as directed by your caregiver. Many medicines can make hyponatremia worse. Discuss all your medicines with your caregiver.  Carefully follow any recommended diet, including any fluid restrictions.  You may be asked to repeat lab tests. Follow these directions.  Avoid alcohol and recreational drugs. SEEK MEDICAL CARE IF:   You develop worsening nausea, fatigue, headache, confusion, or weakness.  Your original hyponatremia symptoms return.  You have problems following the recommended diet. SEEK IMMEDIATE MEDICAL CARE IF:   You have a seizure.  You faint.  You have ongoing diarrhea or vomiting. MAKE SURE YOU:   Understand these instructions.  Will watch your condition.  Will get help right away if you are not doing well or get worse. Document Released: 08/07/2002 Document Revised: 11/09/2011 Document Reviewed: 02/01/2011 [facility] Patient Information 2014 [place], [place].  Dehydration, Adult Dehydration is when you lose more fluids from the body than you take in. Vital organs like the kidneys, brain, and heart cannot function without a proper amount of fluids and  salt. Any loss of fluids from the body can cause dehydration.  CAUSES   Vomiting.  Diarrhea.  Excessive sweating.  Excessive urine output.  Fever. SYMPTOMS  Mild dehydration  Thirst.  Dry lips.  Slightly dry mouth. Moderate dehydration  Very dry mouth.  Sunken eyes.  Skin does not bounce back quickly when lightly pinched and released.  Dark urine and decreased urine production.  Decreased tear production.  Headache. Severe dehydration  Very dry mouth.  Extreme thirst.  Rapid, weak pulse (more than 100 beats per minute at rest).  Cold hands and feet.  Not able to sweat in spite of heat and temperature.  Rapid breathing.  Blue lips.  Confusion and lethargy.  Difficulty being awakened.  Minimal urine production.  No tears.  DIAGNOSIS  Your caregiver will diagnose dehydration based on your symptoms and your exam. Blood and urine tests will help confirm the diagnosis. The diagnostic evaluation should also identify the cause of dehydration. TREATMENT  Treatment of mild or moderate dehydration can often be done at home by increasing the amount of fluids that you drink. It is best to drink small amounts of fluid more often. Drinking too much at one time can make vomiting worse. Refer to the home care instructions below. Severe dehydration needs to be treated at the hospital where you will probably be given intravenous (IV) fluids that contain water and electrolytes. HOME CARE INSTRUCTIONS   Ask your caregiver about specific rehydration instructions.  Drink enough fluids to keep your urine clear or pale yellow.  Drink small amounts frequently if you have nausea and vomiting.  Eat as you normally do.  Avoid:  Foods or drinks high in sugar.  Carbonated drinks.  Juice.  Extremely hot or cold fluids.  Drinks with caffeine.  Fatty, greasy foods.  Alcohol.  Tobacco.  Overeating.  Gelatin desserts.  Wash your hands well to avoid spreading  bacteria and viruses.  Only take over-the-counter or prescription medicines for pain, discomfort, or fever as directed by your caregiver.  Ask your caregiver if you should continue all prescribed and over-the-counter medicines.  Keep all follow-up appointments with your caregiver. SEEK MEDICAL CARE IF:  You have abdominal pain and it increases or stays in one area (localizes).  You have a rash, stiff neck, or severe headache.  You are irritable, sleepy, or difficult to awaken.  You are weak, dizzy, or extremely thirsty. SEEK IMMEDIATE MEDICAL CARE IF:   You are unable to keep fluids down or you get worse despite treatment.  You have frequent episodes of vomiting or diarrhea.  You have blood or green matter (bile) in your vomit.  You have blood in your stool or your stool looks black and tarry.  You have not urinated in 6 to 8 hours, or you have only urinated a small amount of very dark urine.  You have a fever.  You faint. MAKE SURE YOU:   Understand these instructions.  Will watch your condition.  Will get help right away if you are not doing well or get worse. Document Released: 08/17/2005 Document Revised: 11/09/2011 Document Reviewed: 04/06/2011 [facility] Patient Information 2014 [place], [place].

## 2014-01-28 NOTE — ED Notes (Signed)
PA at bedside.

## 2014-01-28 NOTE — ED Notes (Signed)
Patient states that he has chemo and since he has had a headache and burning with urination and frequency.

## 2014-01-28 NOTE — ED Notes (Signed)
MD at bedside. 

## 2014-01-30 ENCOUNTER — Encounter: Payer: Self-pay | Admitting: *Deleted

## 2014-01-30 ENCOUNTER — Telehealth: Payer: Self-pay | Admitting: *Deleted

## 2014-01-30 NOTE — Progress Notes (Signed)
Patient showed up at nursing requesting refill on his MMW and Nystatin,"I'm out of both", informed him MD is out of the office today , I checked he has 1 refill of the MMW at [place] [facility] and he can pick that up there, however, the Nystatin doesn't have a refill and I would need to get another MD here at office to authorize a refill, I gave him my business card again, and will call him after speaking with one of the MD'S here if they choose not to authorize the Nystatin he will  Need to wait till tomorrow when Dr.Moody  Is back in offie, will call patient later today with status informed patient to call 48 hours prior to needing rx's refilled, patient will go pick up MMW, he wants all RX's changed to [place] patient pharmacy 10:46 AM' 10:45 AM

## 2014-01-30 NOTE — Telephone Encounter (Signed)
After speaking with Dr,.Valere Dross about patient status of thrush, , patient will need to come see Dr.moody by Friday, called patient and will have him come in Friday  To se Dr.Moody,transferred call to [facility] to schedule appt 11:52 AM

## 2014-01-30 NOTE — Telephone Encounter (Signed)
Collin Young, spoke with Aaron Edelman after getting fax from them patient transferred RX from [facility] to use [facility] oupt pt pharmacy now, gave 58ml each ot the e.s. Maalox,benadryl&nustatin with 272ml of viscous lidocaine,total 480 ml,. Aaron Edelman will call the patient to let him know to pick up rx 1:15 PM

## 2014-01-30 NOTE — Telephone Encounter (Signed)
error 

## 2014-01-31 NOTE — ED Provider Notes (Signed)
Medical screening examination/treatment/procedure(s) were conducted as a shared visit with non-physician practitioner(s) and myself.  I personally evaluated the patient during the encounter.   EKG Interpretation None      Pt with hx of CA finishing up radiation here with dysuria, general malaise and intermittent HA.  Pt denies any HA currently , AMS or numbness/weakness concerning for intracranial pathology.  Labs reassuring and no signs of UTI.  Pt recently started to take all nutrients by po instead of peg and feel dehydrated.  He feels much better after fluids and discussed pt with Dr. Jana Hakim who has no further rec. At this time and feel pt is safte to f/u with Dr. Marin Olp this week.  Blanchie Dessert, MD 01/31/14 1328

## 2014-02-01 ENCOUNTER — Encounter: Payer: Self-pay | Admitting: Oncology

## 2014-02-01 NOTE — Progress Notes (Signed)
Ailene Ravel from [facility] called regarding Collin Young's fentanyl Rx, according to nurses notes, Collin Young requests Rx be changed to [place].  Ailene Ravel to notify patient, he has 2 boxes waiting here at [place].

## 2014-02-02 ENCOUNTER — Ambulatory Visit
Admission: RE | Admit: 2014-02-02 | Discharge: 2014-02-02 | Disposition: A | Discharge status: Home / Self Care | Payer: Medicaid Other | Source: Ambulatory Visit | Attending: Radiation Oncology | Admitting: Radiation Oncology

## 2014-02-02 ENCOUNTER — Encounter: Payer: Self-pay | Admitting: Radiation Oncology

## 2014-02-02 VITALS — BP 133/79 | HR 88 | Temp 97.9°F | Resp 20 | Ht 65.0 in | Wt 113.8 lb

## 2014-02-02 DIAGNOSIS — C12 Malignant neoplasm of pyriform sinus: Secondary | ICD-10-CM

## 2014-02-02 MED ORDER — NYSTATIN 100000 UNIT/ML MT SUSP: 5.0000 mL | Freq: Four times a day (QID) | OROMUCOSAL | Status: DC

## 2014-02-02 NOTE — Progress Notes (Signed)
  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 He was seen a couple of weeks ago and was noted to have thrush present. He feels that this has persisted. He also ran out of Magic mouthwash and he will pick this up today.                              ALLERGIES:  has No Known Allergies.  Meds: Current Outpatient Prescriptions  Medication Sig Dispense Refill  . Alum & Mag Hydroxide-Simeth (MAGIC MOUTHWASH W/LIDOCAINE) SOLN Take 5 mLs by mouth 4 (four) times daily as needed for mouth pain.  250 mL  1  . emollient (BIAFINE) cream Apply topically as needed.  454 g  0  . fentaNYL (DURAGESIC - DOSED MCG/HR) 12 MCG/HR Place 2 patches (25 mcg total) onto the skin every 3 (three) days.  20 patch  0  . LORazepam (ATIVAN) 0.5 MG tablet Take 0.5 mg by mouth. NOT TAKING      . morphine (ROXANOL) 20 MG/ML concentrated solution Take 0.5 mLs (10 mg total) by mouth every 3 (three) hours as needed for severe pain.  60 mL  0  . Nutritional Supplements (BOOST HIGH PROTEIN PO) Take by mouth 6 (six) times daily.      Marland Kitchen nystatin (MYCOSTATIN) 100000 UNIT/ML suspension Take 5 mLs (500,000 Units total) by mouth 4 (four) times daily.  240 mL  0  . ondansetron (ZOFRAN) 8 MG tablet Take 8 mg by mouth. NOT TAKING      . prochlorperazine (COMPAZINE) 10 MG tablet Take by mouth. NOT  TAKING      . scopolamine (TRANSDERM-SCOP) 1 MG/3DAYS Place 1 patch (1.5 mg total) onto the skin every 3 (three) days.  10 patch  2  . Sennosides (EX-LAX PO) Take by mouth 2 (two) times daily.       No current facility-administered medications for this encounter.   Facility-Administered Medications Ordered in Other Encounters  Medication Dose Route  Frequency Provider Last Rate Last Dose  . topical emolient (BIAFINE) emulsion   Topical Daily Marye Round, MD        Physical Findings: The patient is in no acute distress. Patient is alert and oriented.  height is 5\' 5"  (1.651 m) and weight is 113 lb 12.8 oz (51.619 kg). His oral temperature is 97.9 F (36.6 C). His blood pressure is 133/79 and his pulse is 88. His respiration is 20 and oxygen saturation is 97%. .   No thrush present within the posterior oral cavity. He does have thrush present on the tongue.  Lab Findings: Lab Results  Component Value Date   WBC 5.8 01/28/2014   HGB 11.4* 01/28/2014   HCT 35.0* 01/28/2014   MCV 86.8 01/28/2014   PLT 238 01/28/2014     Radiographic Findings: No results found.  Impression/plan:    The patient was given a refill on nystatin. He will gently brush his tongue. The patient is scheduled for PET scan later this month.    Jodelle Gross, M.D., Ph.D.

## 2014-02-02 NOTE — Progress Notes (Signed)
Patient here as follow up, pyriform sinus, here  for his thush , needs refill on nystatin suspension, checked tongue,still has thrush, called in MMW to [facility] pt pharmacy 01/30/14, , he prefers that pharmacy now trouble swallowing, eating pinto beans, drinks ony water, no nausea,  Has appt for Pet scan 02/22/14, Dr.Ennerver appt 03/01/14, Dr. Enrique Sack appt 03/05/14 9:18 AM

## 2014-02-13 ENCOUNTER — Other Ambulatory Visit: Payer: Self-pay | Admitting: *Deleted

## 2014-02-13 NOTE — Telephone Encounter (Signed)
Patient called asking to refill his morphine, Dr.Ennever filled his Morphine  01/26/14, and redirected him to Dr.Ennever's office [phone].patient thanked [place] he wasn't sure of who ordered last time 3:48 PM

## 2014-02-16 ENCOUNTER — Other Ambulatory Visit: Payer: Self-pay | Admitting: Hematology & Oncology

## 2014-02-16 DIAGNOSIS — C12 Malignant neoplasm of pyriform sinus: Secondary | ICD-10-CM

## 2014-02-22 ENCOUNTER — Encounter (HOSPITAL_COMMUNITY): Payer: Medicaid Other

## 2014-02-22 ENCOUNTER — Ambulatory Visit (HOSPITAL_COMMUNITY)
Admission: RE | Admit: 2014-02-22 | Discharge: 2014-02-22 | Disposition: A | Discharge status: Home / Self Care | Payer: Medicaid Other | Source: Ambulatory Visit | Attending: Hematology & Oncology | Admitting: Hematology & Oncology

## 2014-02-22 DIAGNOSIS — C12 Malignant neoplasm of pyriform sinus: Secondary | ICD-10-CM | POA: Diagnosis not present

## 2014-02-22 MED ORDER — GADOBENATE DIMEGLUMINE 529 MG/ML IV SOLN
9.0000 mL | Freq: Once | INTRAVENOUS | Status: AC | PRN
  Administered 2014-02-22: 9 mL via INTRAVENOUS

## 2014-02-23 ENCOUNTER — Other Ambulatory Visit: Payer: Self-pay | Admitting: Hematology & Oncology

## 2014-02-23 DIAGNOSIS — C760 Malignant neoplasm of head, face and neck: Secondary | ICD-10-CM

## 2014-02-26 ENCOUNTER — Ambulatory Visit (HOSPITAL_COMMUNITY)
Admission: RE | Admit: 2014-02-26 | Discharge: 2014-02-26 | Disposition: A | Discharge status: Home / Self Care | Payer: Medicaid Other | Source: Ambulatory Visit | Attending: Hematology & Oncology | Admitting: Hematology & Oncology

## 2014-02-26 ENCOUNTER — Other Ambulatory Visit: Payer: Self-pay | Admitting: Radiation Oncology

## 2014-02-26 DIAGNOSIS — C760 Malignant neoplasm of head, face and neck: Secondary | ICD-10-CM

## 2014-02-26 MED ORDER — MAGIC MOUTHWASH W/LIDOCAINE: 5.0000 mL | Freq: Four times a day (QID) | ORAL | Status: DC | PRN

## 2014-03-01 ENCOUNTER — Other Ambulatory Visit: Payer: Self-pay | Admitting: Lab

## 2014-03-01 ENCOUNTER — Ambulatory Visit: Payer: Self-pay | Admitting: Hematology & Oncology

## 2014-03-05 ENCOUNTER — Ambulatory Visit (HOSPITAL_BASED_OUTPATIENT_CLINIC_OR_DEPARTMENT_OTHER): Payer: Medicaid Other

## 2014-03-05 ENCOUNTER — Encounter (HOSPITAL_COMMUNITY): Payer: Self-pay | Admitting: Dentistry

## 2014-03-05 ENCOUNTER — Encounter: Payer: Self-pay | Admitting: Hematology & Oncology

## 2014-03-05 ENCOUNTER — Ambulatory Visit (HOSPITAL_COMMUNITY): Payer: Medicaid - Dental | Admitting: Dentistry

## 2014-03-05 ENCOUNTER — Other Ambulatory Visit (HOSPITAL_BASED_OUTPATIENT_CLINIC_OR_DEPARTMENT_OTHER): Payer: Medicaid Other | Admitting: Lab

## 2014-03-05 ENCOUNTER — Ambulatory Visit (HOSPITAL_BASED_OUTPATIENT_CLINIC_OR_DEPARTMENT_OTHER): Payer: Medicaid Other | Admitting: Hematology & Oncology

## 2014-03-05 ENCOUNTER — Encounter (INDEPENDENT_AMBULATORY_CARE_PROVIDER_SITE_OTHER): Payer: Self-pay

## 2014-03-05 VITALS — BP 144/85 | HR 93 | Temp 97.7°F

## 2014-03-05 VITALS — BP 157/78 | HR 81 | Temp 97.4°F | Resp 16 | Ht 65.0 in | Wt 107.0 lb

## 2014-03-05 DIAGNOSIS — Z923 Personal history of irradiation: Secondary | ICD-10-CM

## 2014-03-05 DIAGNOSIS — K000 Anodontia: Principal | ICD-10-CM

## 2014-03-05 DIAGNOSIS — C12 Malignant neoplasm of pyriform sinus: Secondary | ICD-10-CM

## 2014-03-05 DIAGNOSIS — R682 Dry mouth, unspecified: Secondary | ICD-10-CM

## 2014-03-05 DIAGNOSIS — K08109 Complete loss of teeth, unspecified cause, unspecified class: Secondary | ICD-10-CM

## 2014-03-05 DIAGNOSIS — K117 Disturbances of salivary secretion: Secondary | ICD-10-CM

## 2014-03-05 DIAGNOSIS — Z9221 Personal history of antineoplastic chemotherapy: Secondary | ICD-10-CM

## 2014-03-05 DIAGNOSIS — R634 Abnormal weight loss: Secondary | ICD-10-CM

## 2014-03-05 DIAGNOSIS — K0820 Unspecified atrophy of edentulous alveolar ridge: Secondary | ICD-10-CM

## 2014-03-05 DIAGNOSIS — Z8501 Personal history of malignant neoplasm of esophagus: Secondary | ICD-10-CM

## 2014-03-05 DIAGNOSIS — Z463 Encounter for fitting and adjustment of dental prosthetic device: Secondary | ICD-10-CM

## 2014-03-05 LAB — CBC WITH DIFFERENTIAL (CANCER CENTER ONLY)
BASO#: 0 10*3/uL (ref 0.0–0.2)
BASO%: 0.4 % (ref 0.0–2.0)
EOS%: 1.5 % (ref 0.0–7.0)
Eosinophils Absolute: 0 10*3/uL (ref 0.0–0.5)
HEMATOCRIT: 42.2 % (ref 38.7–49.9)
HGB: 14 g/dL (ref 13.0–17.1)
LYMPH#: 0.6 10*3/uL — AB (ref 0.9–3.3)
LYMPH%: 22.8 % (ref 14.0–48.0)
MCH: 28.3 pg (ref 28.0–33.4)
MCHC: 33.2 g/dL (ref 32.0–35.9)
MCV: 85 fL (ref 82–98)
MONO#: 0.6 10*3/uL (ref 0.1–0.9)
MONO%: 23.2 % — ABNORMAL HIGH (ref 0.0–13.0)
NEUT%: 52.1 % (ref 40.0–80.0)
NEUTROS ABS: 1.4 10*3/uL — AB (ref 1.5–6.5)
Platelets: 245 10*3/uL (ref 145–400)
RBC: 4.95 10*6/uL (ref 4.20–5.70)
RDW: 13.6 % (ref 11.1–15.7)
WBC: 2.7 10*3/uL — ABNORMAL LOW (ref 4.0–10.0)

## 2014-03-05 LAB — CMP (CANCER CENTER ONLY)
ALBUMIN: 3.5 g/dL (ref 3.3–5.5)
ALT: 18 U/L (ref 10–47)
AST: 28 U/L (ref 11–38)
Alkaline Phosphatase: 43 U/L (ref 26–84)
BUN, Bld: 14 mg/dL (ref 7–22)
CALCIUM: 9.3 mg/dL (ref 8.0–10.3)
CHLORIDE: 90 meq/L — AB (ref 98–108)
CO2: 33 meq/L (ref 18–33)
Creat: 0.9 mg/dl (ref 0.6–1.2)
Glucose, Bld: 94 mg/dL (ref 73–118)
Potassium: 4 mEq/L (ref 3.3–4.7)
SODIUM: 137 meq/L (ref 128–145)
TOTAL PROTEIN: 8.1 g/dL (ref 6.4–8.1)
Total Bilirubin: 0.5 mg/dl (ref 0.20–1.60)

## 2014-03-05 LAB — PREALBUMIN: Prealbumin: 9.8 mg/dL — ABNORMAL LOW (ref 17.0–34.0)

## 2014-03-05 LAB — LACTATE DEHYDROGENASE: LDH: 175 U/L (ref 94–250)

## 2014-03-05 MED ORDER — SODIUM CHLORIDE 0.9 % IV SOLN
Freq: Once | INTRAVENOUS | Status: DC
  Administered 2014-03-05: 13:00:00 via INTRAVENOUS

## 2014-03-05 MED ORDER — HEPARIN SOD (PORK) LOCK FLUSH 100 UNIT/ML IV SOLN
500.0000 [IU] | Freq: Once | INTRAVENOUS | Status: AC
  Administered 2014-03-05: 500 [IU] via INTRAVENOUS
  Filled 2014-03-05: qty 5

## 2014-03-05 MED ORDER — SODIUM CHLORIDE 0.9 % IJ SOLN
10.0000 mL | INTRAMUSCULAR | Status: DC | PRN
  Administered 2014-03-05: 10 mL via INTRAVENOUS
  Filled 2014-03-05: qty 10

## 2014-03-05 NOTE — Progress Notes (Signed)
Hematology and Oncology Follow Up Visit  Collin Young [id number] Aug 17, 1951 63 y.o. 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  Young is back for followup. He and his wife were done for. He had a nice little vacation. They brought [place] back a coffee mug which was very nice of them.  He did have an MRI done. This was done to assess for response to therapy. The MRI showed a resolution of his left pyriform sinus mass.  He still losing weight. He's not using his tube all that much. Is trying to drink and swallow regular food. He's having pured food. I still don't think he's taking in as much as he thinks. I tried to get him to use his feeding tube more often. His wife is with him and she is trying to get him to take in more through his feeding tube.  Is not hurting that much. He is on a Duragesic patch.  He's had no bleeding. He's had no fever. He's had no change in bowel or bladder habits.  Overall, his performance status is  ECOG 2.  Medications: Current outpatient prescriptions:Alum & Mag Hydroxide-Simeth (MAGIC MOUTHWASH W/LIDOCAINE) SOLN, Take 5 mLs by mouth 4 (four) times daily as needed for mouth pain., Disp: 250 mL, Rfl: 1;  emollient (BIAFINE) cream, Apply topically as needed., Disp: 454 g, Rfl: 0;  fentaNYL (DURAGESIC - DOSED MCG/HR) 12 MCG/HR, Place 2 patches (25 mcg total) onto the skin every 3 (three) days., Disp: 20 patch, Rfl: 0 morphine (ROXANOL) 20 MG/ML concentrated solution, Take 0.5 mLs (10 mg total) by mouth every 3 (three) hours as needed for severe pain., Disp: 60 mL, Rfl: 0;  Nutritional Supplements (BOOST HIGH PROTEIN PO), Take by mouth 6 (six) times daily., Disp: , Rfl: ;  Sennosides (EX-LAX PO), Take by mouth 2 (two) times daily., Disp: , Rfl: ;   LORazepam (ATIVAN) 0.5 MG tablet, Take 0.5 mg by mouth. NOT TAKING, Disp: , Rfl:  nystatin (MYCOSTATIN) 100000 UNIT/ML suspension, Take 5 mLs by mouth 4 (four) times daily. NOT TAKING, Disp: , Rfl: ;  ondansetron (ZOFRAN) 8 MG tablet, Take 8 mg by mouth. NOT TAKING, Disp: , Rfl: ;  prochlorperazine (COMPAZINE) 10 MG tablet, Take by mouth. NOT  TAKING, Disp: , Rfl: ;  scopolamine (TRANSDERM-SCOP) 1 MG/3DAYS, Place 1 patch onto the skin every 3 (three) days. NOT USING, Disp: , Rfl:  Current facility-administered medications:0.9 %  sodium chloride infusion, , Intravenous, Continuous, [name], MD Facility-Administered Medications Ordered in Other Visits: topical emolient (BIAFINE) emulsion, , Topical, Daily, Marye Round, MD  Allergies: No Known Allergies  Past Medical History, Surgical history, Social history, and Family History were reviewed and updated.  Review of Systems: As above  Physical Exam:  height is 5\' 5"  (1.651 m) and weight is 107 lb (48.535 kg). His oral temperature is 97.4 F (36.3 C). His blood pressure is 157/78 and his pulse is 81. His respiration is 16.   Thin African American gentleman. Head and neck exam shows no ocular or oral lesions. He has no palpable cervical or supraclavicular lymph nodes. Lungs are clear bilaterally. Cardiac exam regular rate and rhythm with a normal S1 and S2. Abdomen is soft. His feeding tube is intact. She has no fluid  wave. There is no guarding or rebound tenderness. He has no palpable liver or spleen tip. Back exam shows no tenderness over the spine ribs or hips. Extremities shows no clubbing cyanosis or edema. Neurological exam shows no focal neurological deficits. Skin exam is somewhat dry. Lab Results  Component Value Date   WBC 2.7* 03/05/2014   HGB 14.0 03/05/2014   HCT 42.2 03/05/2014   MCV 85 03/05/2014   PLT 245 03/05/2014     Chemistry      Component Value Date/Time   NA 137 03/05/2014 1103   NA 128* 01/28/2014 1225   K 4.0 03/05/2014  1103   K 4.0 01/28/2014 1225   CL 90* 03/05/2014 1103   CL 90* 01/28/2014 1225   CO2 33 03/05/2014 1103   CO2 28 01/28/2014 1225   BUN 14 03/05/2014 1103   BUN 14 01/28/2014 1225   CREATININE 0.9 03/05/2014 1103   CREATININE 0.63 01/28/2014 1225      Component Value Date/Time   CALCIUM 9.3 03/05/2014 1103   CALCIUM 8.9 01/28/2014 1225   ALKPHOS 43 03/05/2014 1103   ALKPHOS 49 01/28/2014 1225   AST 28 03/05/2014 1103   AST 20 01/28/2014 1225   ALT 18 03/05/2014 1103   ALT 13 01/28/2014 1225   BILITOT 0.50 03/05/2014 1103   BILITOT 0.3 01/28/2014 1225         Impression and Plan: Collin Young is a 63 year old gentleman with stage II squamous cell carcinoma of the left pyriform sinus. He underwent radiation and chemotherapy. He has responded very nicely.  I have to believe that he will gain weight. This may take some more time. I don't think we have to do anything different. I just will continue to encourage him to use his feeding tube.  I will plan to give him some fluids today.  We will see if Dr. Redmond Baseman from ENT can see him and do another endoscopy to look directly at the pyriform sinus.  I will I to see him back in about a month or so.   [name], MD 7/6/201512:47 PM

## 2014-03-05 NOTE — Patient Instructions (Signed)
Clinic as scheduled for continued upper and lower complete denture fabrication. Dr. Enrique Sack

## 2014-03-05 NOTE — Progress Notes (Signed)
03/05/2014  Patient:            Collin Young Date of Birth:  1951/05/10 MRN:                [id number]  BP 144/85  Pulse 93  Temp(Src) 97.7 F (36.5 C) (Oral)  [name] presents for start of upper and lower denture fabrication. Exam: Patient is edentulous. Discussed procedures involved in upper and lower denture fabrication and prognosis for successful ability to wear dentures. Price for dentures confirmed.  Patient agrees to proceed with upper and lower denture fabrication. Procedure:  Upper and lower denture primary impressions in alginate. Lab pour. To [facility] for upper and lower denture custom tray fabrication. RTC for upper and lower denture final impressions.  Lenn Cal, DDS

## 2014-03-05 NOTE — Patient Instructions (Signed)

## 2014-03-12 ENCOUNTER — Telehealth: Payer: Self-pay | Admitting: Nutrition

## 2014-03-12 NOTE — Telephone Encounter (Signed)
Called patient to follow up on TF.  Patient has lost 20 pounds over the past 3 months.  He had decreased his tube feedings.  Weight documented at 107 pounds on July 6.    Patient states he has increased osmolite 1.5 back to goal of 6 cans daily through his tube.  He is blenderizing foods and drinking them.  He tries to drink a lot of water.  Patient denies nutrition issues.  States he appreciates nutrition follow up and desires continued phone follow up.  Will continue to follow patient by phone.  Estimated Nutrition Needs:  2100- 2300 calories, 110-120 g protein, 2.2 L fluid  Osmolite 1.5 - 6 cans daily provides 2130 calories, 89 g protein,2046 ml free water.

## 2014-03-14 ENCOUNTER — Telehealth: Payer: Self-pay | Admitting: Hematology & Oncology

## 2014-03-14 ENCOUNTER — Encounter (HOSPITAL_COMMUNITY): Payer: Self-pay | Admitting: Dentistry

## 2014-03-14 ENCOUNTER — Ambulatory Visit (HOSPITAL_COMMUNITY): Payer: Medicaid - Dental | Admitting: Dentistry

## 2014-03-14 VITALS — BP 153/75 | HR 98 | Temp 99.9°F

## 2014-03-14 DIAGNOSIS — K117 Disturbances of salivary secretion: Secondary | ICD-10-CM

## 2014-03-14 DIAGNOSIS — Z463 Encounter for fitting and adjustment of dental prosthetic device: Secondary | ICD-10-CM

## 2014-03-14 DIAGNOSIS — K08109 Complete loss of teeth, unspecified cause, unspecified class: Secondary | ICD-10-CM

## 2014-03-14 DIAGNOSIS — C12 Malignant neoplasm of pyriform sinus: Secondary | ICD-10-CM

## 2014-03-14 DIAGNOSIS — K0820 Unspecified atrophy of edentulous alveolar ridge: Secondary | ICD-10-CM

## 2014-03-14 DIAGNOSIS — R682 Dry mouth, unspecified: Secondary | ICD-10-CM

## 2014-03-14 DIAGNOSIS — Z923 Personal history of irradiation: Secondary | ICD-10-CM

## 2014-03-14 DIAGNOSIS — Z9221 Personal history of antineoplastic chemotherapy: Secondary | ICD-10-CM

## 2014-03-14 DIAGNOSIS — K000 Anodontia: Principal | ICD-10-CM

## 2014-03-14 NOTE — Telephone Encounter (Signed)
Left message with [phone] appointment

## 2014-03-14 NOTE — Patient Instructions (Signed)
Return to clinic as scheduled for continued fabrication of upper lower complete dentures. Dr. Enrique Sack

## 2014-03-14 NOTE — Progress Notes (Signed)
03/14/2014  Patient:            Collin Young Date of Birth:  1951/08/30 MRN:                [id number]  BP 153/75  Pulse 98  Temp(Src) 99.9 F (37.7 C) (Oral)   Collin Young presents for continued upper and lower complete denture fabrication. Procedure:  Upper and lower border molding and final impressions in Aquasil. Patient tolerated procedure well. To [facility] for custom baseplates with rims. Return to clinic for upper and lower complete denture jaw relations.  Lenn Cal, DDS

## 2014-03-19 ENCOUNTER — Other Ambulatory Visit: Payer: Self-pay | Admitting: *Deleted

## 2014-03-19 DIAGNOSIS — C12 Malignant neoplasm of pyriform sinus: Secondary | ICD-10-CM

## 2014-03-19 MED ORDER — MORPHINE SULFATE (CONCENTRATE) 20 MG/ML PO SOLN: 10.0000 mg | ORAL | Status: DC | PRN

## 2014-03-19 MED ORDER — FENTANYL 12 MCG/HR TD PT72: 25.0000 ug | MEDICATED_PATCH | TRANSDERMAL | Status: DC

## 2014-03-22 ENCOUNTER — Ambulatory Visit
Admission: RE | Admit: 2014-03-22 | Discharge: 2014-03-22 | Disposition: A | Discharge status: Home / Self Care | Payer: Medicaid Other | Source: Ambulatory Visit | Attending: Radiation Oncology | Admitting: Radiation Oncology

## 2014-03-22 VITALS — BP 156/76 | HR 84 | Temp 97.5°F | Wt 113.8 lb

## 2014-03-22 DIAGNOSIS — C12 Malignant neoplasm of pyriform sinus: Secondary | ICD-10-CM

## 2014-03-22 NOTE — Progress Notes (Signed)
Routine follow up completion of radiation to piriform sinus on 12/05/13.Continued hoarseness  but voice much more comprehendible.Denies pain today but states he has stomach pain on coughing at times.Main concern today is constipation, currently takes ex-lax.Patient will try either colace or miralax.Continued productive tan mucus secretions.Wearing dentures today without difficulty.

## 2014-03-23 MED ORDER — MAGIC MOUTHWASH W/LIDOCAINE: 5.0000 mL | Freq: Four times a day (QID) | ORAL | Status: DC | PRN

## 2014-03-23 NOTE — Progress Notes (Signed)
Radiation Oncology         [phone] ________________________________  Name: Collin Young MRN: [id number]  Date: 03/22/2014  DOB: 26-Nov-1950  Follow-Up Visit Note  CC: Myriam Jacobson, MD  [name], MD  Diagnosis:   Squamous cell carcinoma of the puriform sinus  Interval Since Last Radiation:  The patient completed radiation treatment on 12/05/2013   Narrative:  The patient returns today for routine follow-up.  The patient states that he has continued to do better overall since he was last seen. The patient is pleased with how her skin has healed. Irritation within the oral cavity has also improved. He does continue to have some pain with swallowing and he requests a refill on Magic mouthwash. The patient recently had an MRI scan of the neck which showed resolution of the previously noted mass with no evidence based on imaging of residual or recurrent disease. The patient also was examined recently by Dr. Redmond Baseman and the patient states that everything looked good from that perspective as well.   The patient does continue to lose some weight. He has discussed this with Dr. Marin Olp as well recently and we also discussed this in some detail. He understands that he needs to increase his intake. The patient has been increasing his oral intake slowly with pured foods.                             ALLERGIES:  has No Known Allergies.  Meds: Current Outpatient Prescriptions  Medication Sig Dispense Refill  . Alum & Mag Hydroxide-Simeth (MAGIC MOUTHWASH W/LIDOCAINE) SOLN Take 5 mLs by mouth 4 (four) times daily as needed for mouth pain.  250 mL  1  . emollient (BIAFINE) cream Apply topically as needed.  454 g  0  . fentaNYL (DURAGESIC - DOSED MCG/HR) 12 MCG/HR Place 2 patches (25 mcg total) onto the skin every 3 (three) days.  20 patch  0  . morphine (ROXANOL) 20 MG/ML concentrated solution Take 0.5 mLs (10 mg total) by mouth every 3 (three) hours as needed for severe pain.  60 mL   0  . Nutritional Supplements (BOOST HIGH PROTEIN PO) Take by mouth 6 (six) times daily.      . Sennosides (EX-LAX PO) Take by mouth 2 (two) times daily.      Marland Kitchen LORazepam (ATIVAN) 0.5 MG tablet Take 0.5 mg by mouth. NOT TAKING      . nystatin (MYCOSTATIN) 100000 UNIT/ML suspension Take 5 mLs by mouth 4 (four) times daily. NOT TAKING      . ondansetron (ZOFRAN) 8 MG tablet Take 8 mg by mouth. NOT TAKING      . prochlorperazine (COMPAZINE) 10 MG tablet Take by mouth. NOT  TAKING      . scopolamine (TRANSDERM-SCOP) 1 MG/3DAYS Place 1 patch onto the skin every 3 (three) days. NOT USING       No current facility-administered medications for this encounter.   Facility-Administered Medications Ordered in Other Encounters  Medication Dose Route Frequency Provider Last Rate Last Dose  . topical emolient (BIAFINE) emulsion   Topical Daily Marye Round, MD        Physical Findings: The patient is in no acute distress. Patient is alert and oriented.  weight is 113 lb 12.8 oz (51.619 kg). His temperature is 97.5 F (36.4 C). His blood pressure is 156/76 and his pulse is 84. His oxygen saturation is 99%. Marland Kitchen  Oral cavity without mucositis at this time. Neck demonstrates no cervical lymphadenopathy.  Lab Findings: Lab Results  Component Value Date   WBC 2.7* 03/05/2014   HGB 14.0 03/05/2014   HCT 42.2 03/05/2014   MCV 85 03/05/2014   PLT 245 03/05/2014     Radiographic Findings: Mr Neck Soft Tissue Only W Wo Contrast  02/26/2014   CLINICAL DATA:  Piriform sinus carcinoma. Post radiation and chemotherapy .  EXAM: MR NECK SOFT TISSUE ONLY WITHOUT AND WITH CONTRAST  TECHNIQUE: Multiplanar, multisequence MR imaging was performed both before and after administration of intravenous contrast.  CONTRAST:  16mL MULTIHANCE GADOBENATE DIMEGLUMINE 529 MG/ML IV SOLN  COMPARISON:  CT neck 09/07/2013  FINDINGS: The study was performed on 02/22/2014. The patient returned on 02/26/2014 for repeat images as the initial  axial images were performed with water saturation instead of fat saturation. The patient received intravenous contrast on both scans.  Bulky mass in the left piriform sinus no longer visualized. Left vocal cord is overly adducted which may be secondary to radiation change. Thyroid cartilage intact. No significant soft tissue edema is present in the radiation field.  No pathologic adenopathy is identified in the neck. Parotid and submandibular glands appear atrophic secondary to radiation change. Thyroid is not enlarged.  Abnormal signal in the bone marrow throughout the T1 vertebral body. Mild areas of abnormal signal at C7 and T1. These areas show decreased signal on T1 and T2 and do not show significant enhancement. Therefore, this is not felt to be due to tumor may be related to radiation change. Attention to this on follow-up is suggested. Correlate with radiation fields is suggested. The patient has a history of esophageal cancer. It is possible that the abnormal bone marrow at T1 is between radiation fields for the piriform sinus cancer and the esophageal cancer.  IMPRESSION: Interval resolution of left piriform sinus. Presume tumor was present in the left vocal cord based on prior PET scan. Recommend direct visualization. Abduction left vocal cord may be due to edema from radiation therapy. This was also noted on the CT of 09/07/2013. No pathologic adenopathy.  Abnormal signal in the bone marrow is C7, T1, and T2 likely due to radiation therapy. Correlate with prior radiation fields of the neck and esophagus.   Electronically Signed   By: Franchot Gallo M.D.   On: 02/26/2014 13:32    Impression:    The patient continues to recover from his course of chemoradiation treatment. No evidence of residual disease at this time.  Plan:  The patient will followup in our clinic in 3 months.  I spent 15 minutes with the patient today, the majority of which was spent counseling the patient on the diagnosis of  cancer and coordinating care.   Jodelle Gross, M.D., Ph.D.

## 2014-03-26 ENCOUNTER — Encounter (HOSPITAL_COMMUNITY): Payer: Self-pay | Admitting: Dentistry

## 2014-03-28 ENCOUNTER — Encounter (HOSPITAL_COMMUNITY): Payer: Self-pay | Admitting: Dentistry

## 2014-03-28 ENCOUNTER — Ambulatory Visit (HOSPITAL_COMMUNITY): Payer: Medicaid - Dental | Admitting: Dentistry

## 2014-03-28 VITALS — BP 155/74 | HR 102 | Temp 98.6°F

## 2014-03-28 DIAGNOSIS — K08109 Complete loss of teeth, unspecified cause, unspecified class: Secondary | ICD-10-CM

## 2014-03-28 DIAGNOSIS — R682 Dry mouth, unspecified: Secondary | ICD-10-CM

## 2014-03-28 DIAGNOSIS — K117 Disturbances of salivary secretion: Secondary | ICD-10-CM

## 2014-03-28 DIAGNOSIS — K000 Anodontia: Principal | ICD-10-CM

## 2014-03-28 DIAGNOSIS — Z923 Personal history of irradiation: Secondary | ICD-10-CM

## 2014-03-28 DIAGNOSIS — K0820 Unspecified atrophy of edentulous alveolar ridge: Secondary | ICD-10-CM

## 2014-03-28 DIAGNOSIS — M2607 Excessive tuberosity of jaw: Secondary | ICD-10-CM

## 2014-03-28 DIAGNOSIS — Z463 Encounter for fitting and adjustment of dental prosthetic device: Secondary | ICD-10-CM

## 2014-03-28 DIAGNOSIS — Z9221 Personal history of antineoplastic chemotherapy: Secondary | ICD-10-CM

## 2014-03-28 NOTE — Progress Notes (Signed)
03/28/2014  Patient:            Collin Young Date of Birth:  1951/08/08 MRN:                [id number]  BP 155/74  Pulse 102  Temp(Src) 98.6 F (37 C) (Oral)  [name] presents for continued denture fabrication. Procedure:  Upper and lower denture Jaw relations with aluwax bite registration. Used previous dentures as guide for jaw relations and tooth selection. Patient agrees to tooth selection of 21 X, P, and 10 degree posteriors to match with Portrait A2 shade. Very difficult to relation secondary to excessive maxillary tuberosities and patient tendency to protrude to end to end position. Patient was made aware of the existing dentures 50% overbite and lack of vertical dimension. Patient tolerated procedure well. RTC for denture wax try in.  Lenn Cal, DDS

## 2014-03-28 NOTE — Patient Instructions (Signed)
Return to clinic as scheduled for continued upper lower complete denture fabrication. Dr. Azavier Creson 

## 2014-04-09 ENCOUNTER — Encounter (HOSPITAL_COMMUNITY): Payer: Self-pay | Admitting: Dentistry

## 2014-04-10 ENCOUNTER — Ambulatory Visit (HOSPITAL_COMMUNITY): Payer: Medicaid - Dental | Admitting: Dentistry

## 2014-04-10 ENCOUNTER — Encounter (HOSPITAL_COMMUNITY): Payer: Self-pay | Admitting: Dentistry

## 2014-04-10 ENCOUNTER — Encounter (INDEPENDENT_AMBULATORY_CARE_PROVIDER_SITE_OTHER): Payer: Self-pay

## 2014-04-10 VITALS — BP 131/62 | HR 88 | Temp 98.7°F

## 2014-04-10 DIAGNOSIS — M2607 Excessive tuberosity of jaw: Secondary | ICD-10-CM

## 2014-04-10 DIAGNOSIS — Z9221 Personal history of antineoplastic chemotherapy: Secondary | ICD-10-CM

## 2014-04-10 DIAGNOSIS — K000 Anodontia: Secondary | ICD-10-CM

## 2014-04-10 DIAGNOSIS — R682 Dry mouth, unspecified: Secondary | ICD-10-CM

## 2014-04-10 DIAGNOSIS — K117 Disturbances of salivary secretion: Secondary | ICD-10-CM

## 2014-04-10 DIAGNOSIS — K08109 Complete loss of teeth, unspecified cause, unspecified class: Secondary | ICD-10-CM

## 2014-04-10 DIAGNOSIS — Z463 Encounter for fitting and adjustment of dental prosthetic device: Secondary | ICD-10-CM

## 2014-04-10 DIAGNOSIS — Z923 Personal history of irradiation: Principal | ICD-10-CM

## 2014-04-10 DIAGNOSIS — K0820 Unspecified atrophy of edentulous alveolar ridge: Secondary | ICD-10-CM

## 2014-04-10 NOTE — Patient Instructions (Signed)
Return to clinic as scheduled for continued upper and lower complete denture fabrication. Lenn Cal, DDS

## 2014-04-10 NOTE — Progress Notes (Signed)
04/10/2014  Patient:            Collin Young Date of Birth:  29-Dec-1950 MRN:                [id number]  BP 131/62  Pulse 88  Temp(Src) 98.7 F (37.1 C) (Oral)  Collin Young presents for continued upper and lower denture fabrication. Procedure:  Upper and lower denture wax tryin. Patient accepts esthetics, phonetics, fit and function. Patient agrees to process "as is" in 50:50 [place] 199 with characterization. Patient to RTC for  upper and lower denture insertion.  Lenn Cal, DDS

## 2014-04-16 ENCOUNTER — Ambulatory Visit: Payer: Medicaid Other | Attending: Radiation Oncology

## 2014-04-16 ENCOUNTER — Ambulatory Visit: Payer: Medicaid Other

## 2014-04-16 ENCOUNTER — Telehealth: Payer: Self-pay | Admitting: Hematology & Oncology

## 2014-04-16 ENCOUNTER — Encounter: Payer: Self-pay | Admitting: *Deleted

## 2014-04-16 DIAGNOSIS — C12 Malignant neoplasm of pyriform sinus: Secondary | ICD-10-CM | POA: Insufficient documentation

## 2014-04-16 DIAGNOSIS — IMO0001 Reserved for inherently not codable concepts without codable children: Secondary | ICD-10-CM | POA: Insufficient documentation

## 2014-04-16 DIAGNOSIS — R1310 Dysphagia, unspecified: Secondary | ICD-10-CM | POA: Insufficient documentation

## 2014-04-16 NOTE — Progress Notes (Signed)
Patient came to nursing stating he has run out of his MMW and needs a refill, can call to [facility] [phone], His phone number is [phone], informed patient MD is in consult I would leave him an in basket and call him tomorrow , patient  Stated"Thankyou and left 4:19 PM

## 2014-04-16 NOTE — Telephone Encounter (Signed)
pt missed Speech appt r/s for 8.24.Marland KitchenMarland Kitchenprinted pt new sched

## 2014-04-17 ENCOUNTER — Other Ambulatory Visit: Payer: Self-pay | Admitting: Radiation Oncology

## 2014-04-17 MED ORDER — MAGIC MOUTHWASH W/LIDOCAINE: 5.0000 mL | Freq: Four times a day (QID) | ORAL | Status: DC | PRN

## 2014-04-18 ENCOUNTER — Telehealth: Payer: Self-pay | Admitting: *Deleted

## 2014-04-18 ENCOUNTER — Encounter (INDEPENDENT_AMBULATORY_CARE_PROVIDER_SITE_OTHER): Payer: Self-pay

## 2014-04-18 ENCOUNTER — Ambulatory Visit (HOSPITAL_COMMUNITY): Payer: Medicaid - Dental | Admitting: Dentistry

## 2014-04-18 VITALS — BP 150/76 | HR 82 | Temp 98.3°F

## 2014-04-18 DIAGNOSIS — Z923 Personal history of irradiation: Secondary | ICD-10-CM

## 2014-04-18 DIAGNOSIS — K000 Anodontia: Principal | ICD-10-CM

## 2014-04-18 DIAGNOSIS — K0820 Unspecified atrophy of edentulous alveolar ridge: Secondary | ICD-10-CM

## 2014-04-18 DIAGNOSIS — M2607 Excessive tuberosity of jaw: Secondary | ICD-10-CM

## 2014-04-18 DIAGNOSIS — K117 Disturbances of salivary secretion: Secondary | ICD-10-CM

## 2014-04-18 DIAGNOSIS — K08109 Complete loss of teeth, unspecified cause, unspecified class: Secondary | ICD-10-CM

## 2014-04-18 DIAGNOSIS — R682 Dry mouth, unspecified: Secondary | ICD-10-CM

## 2014-04-18 DIAGNOSIS — Z9221 Personal history of antineoplastic chemotherapy: Secondary | ICD-10-CM

## 2014-04-18 DIAGNOSIS — Z463 Encounter for fitting and adjustment of dental prosthetic device: Secondary | ICD-10-CM

## 2014-04-18 NOTE — Patient Instructions (Signed)
Instructions for Denture Use and Care  Congratulations, you are on the way to oral rehabilitation!  You have just received a new set of complete or partial dentures.  These prostheses will help to improve both your appearance and chewing ability.  These instructions will help you get adjusted to your dentures as well as care for them properly.  Please read these instructions carefully and completely as soon as you get home.  If you or your caregiver have any questions please notify the [facility] at [phone].  HOW YOUR DENTURES LOOK AND FEEL Soon after you begin wearing your dentures, you may feel that your dentures are too large or even loose.  As our mouth and facial muscles become accustomed to the dentures, these feelings will go away.  You also may feel that you are salivating more than you normally do.  This feeling should go away as you get used to having the dentures in your mouth.  You may bite your cheek or your tongue; this will eventually resolve itself as you wear your dentures.  Some soreness is to be expected, but you should not hurt.  If your mouth hurts, call your dentist.  A denture adhesive may occasionally be necessary to hold your dentures in place more securely.  The dentist will let you know when one is recommended for you.  SPEAKING Wearing dentures will change the sound of your voice initially.  This will be noticed by you more than anyone else.  Bite and swallow before you speak, in order to place your dentures in position so that you may speak more clearly.  Practice speaking by reading aloud or counting from 1 to 100 very slowly and distinctly.  After some practice your mouth will become accustomed to your dentures and you will speak more clearly.  EATING Chewing will definitely be different after you receive your dentures.  With a little practice and patience you should be able to eat just about any kind of food.  Begin by eating small quantities of food  that are cut into small pieces.  Star with soft foods such as eggs, cooked vegetables, or puddings.  As you gain confidence advance  Your diet to whatever texture foods you can tolerate.  DENTURE CARE Dentures can collect plaque and calculus much the same as natural teeth can.  If not removed on a regular basis, your dentures will not look or feel clean, and you will experience denture odor.  It is very important that you remove your dentures at bedtime and clean them thoroughly.  You should: 1. Clean your dentures over a sink full of water so if dropped, breakage will be prevented. 2. Rinse your dentures with cool water to remove any large food particles. 3. Use soap and water or a denture cleanser or paste to clean the dentures.  Do not use regular toothpaste as it may abrade the denture base or teeth. 4. Use a moistened denture brush to clean all surfaces (inside and outside). 5. Rinse thoroughly to remove any remaining soap or denture cleanser. 6. Use a soft bristle toothbrush to gently brush any natural teeth, gums, tongue, and palate at bedtime and before reinserting your dentures. 7. Do not sleep with your dentures in your mouth at night.  Remove your dentures and soak them overnight in a denture cup filled with water or denture solution as recommended by your dentist.  This routine will become second nature and will increase the life and comfort   of your dentures.  Please do not try to adjust these dentures yourself; you could damage them.  FOLLOW-UP You should call or make an appointment with your dentist.  Your dentist would like to see you at least once a year for a check-up and examination. 

## 2014-04-18 NOTE — Telephone Encounter (Signed)
Called patient  And left voice message to remind him his MMW rx refill was done by Dr,moody on 04/17/14  Can pick up at [facility] if he hasn't already 8:46 AM'

## 2014-04-18 NOTE — Progress Notes (Signed)
04/18/2014  Patient:            Collin Young Date of Birth:  11-Jul-1951 MRN:                [id number]  BP 150/76  Pulse 82  Temp(Src) 98.3 F (36.8 C) (Oral)  [name] presents for insertion of upper and lower complete dentures. Procedure: Pressure indicating paste was applied to the dentures. Adjustments were made as needed. [place] Occlusion evaluated and adjustments made as needed for Centric Relation and protrusive strokes. Patient with tendency to protrude to end to end position. Patient was instructed in finding the maximum intercuspation position. Good esthetics, phonetics, fit, and function noted. Patient accepts results. Post op instructions provided in written and verbal formats on use and care of dentures. Gave patient denture brush and cup. Patient to keep dentures out if sore spots develop. Use salt water rinses as needed to aid healing. Return to clinic as scheduled for denture adjustment.   Call if problems arise before then. Patient dismissed in stable condition.  Lenn Cal, DDS

## 2014-04-23 ENCOUNTER — Ambulatory Visit: Payer: Medicaid Other

## 2014-04-23 ENCOUNTER — Ambulatory Visit: Payer: Self-pay

## 2014-04-24 ENCOUNTER — Other Ambulatory Visit: Payer: Self-pay | Admitting: *Deleted

## 2014-04-24 ENCOUNTER — Encounter (HOSPITAL_COMMUNITY): Payer: Self-pay | Admitting: Dentistry

## 2014-04-24 DIAGNOSIS — C12 Malignant neoplasm of pyriform sinus: Secondary | ICD-10-CM

## 2014-04-24 MED ORDER — MORPHINE SULFATE (CONCENTRATE) 20 MG/ML PO SOLN: 10.0000 mg | ORAL | Status: DC | PRN

## 2014-04-24 NOTE — Telephone Encounter (Signed)
Erroneous encounter

## 2014-04-25 ENCOUNTER — Encounter (HOSPITAL_COMMUNITY): Payer: Self-pay | Admitting: Dentistry

## 2014-04-30 ENCOUNTER — Ambulatory Visit: Payer: Medicaid Other

## 2014-04-30 DIAGNOSIS — R1310 Dysphagia, unspecified: Secondary | ICD-10-CM | POA: Diagnosis not present

## 2014-04-30 DIAGNOSIS — C12 Malignant neoplasm of pyriform sinus: Secondary | ICD-10-CM | POA: Diagnosis not present

## 2014-04-30 DIAGNOSIS — IMO0001 Reserved for inherently not codable concepts without codable children: Secondary | ICD-10-CM | POA: Diagnosis not present

## 2014-05-01 ENCOUNTER — Encounter (INDEPENDENT_AMBULATORY_CARE_PROVIDER_SITE_OTHER): Payer: Self-pay

## 2014-05-01 ENCOUNTER — Ambulatory Visit (HOSPITAL_COMMUNITY): Payer: Medicaid - Dental | Admitting: Dentistry

## 2014-05-01 ENCOUNTER — Encounter (HOSPITAL_COMMUNITY): Payer: Self-pay | Admitting: Dentistry

## 2014-05-01 VITALS — BP 142/70 | HR 78 | Temp 98.4°F

## 2014-05-01 DIAGNOSIS — K0820 Unspecified atrophy of edentulous alveolar ridge: Secondary | ICD-10-CM

## 2014-05-01 DIAGNOSIS — Z972 Presence of dental prosthetic device (complete) (partial): Secondary | ICD-10-CM

## 2014-05-01 DIAGNOSIS — K08109 Complete loss of teeth, unspecified cause, unspecified class: Secondary | ICD-10-CM

## 2014-05-01 DIAGNOSIS — K000 Anodontia: Principal | ICD-10-CM

## 2014-05-01 DIAGNOSIS — Z463 Encounter for fitting and adjustment of dental prosthetic device: Secondary | ICD-10-CM

## 2014-05-01 DIAGNOSIS — Z923 Personal history of irradiation: Secondary | ICD-10-CM

## 2014-05-01 DIAGNOSIS — Z9221 Personal history of antineoplastic chemotherapy: Secondary | ICD-10-CM

## 2014-05-01 NOTE — Progress Notes (Signed)
05/01/2014  Patient:            Collin Young Date of Birth:  [birth year] MRN:                [id number]  BP 142/70  Pulse 78  Temp(Src) 98.4 F (36.9 C) (Oral)  Collin Young presents for evaluation of recently inserted upper and lower complete dentures.  Patient had broken appointment on 04/25/2014 and reschedule for today.  SUBJECTIVE: Patient is not complaining of any denture irritation. "I am able to aid anything I want" OBJECTIVE: There is no sign of denture irritation or erythema. Procedure: Pressure indicating paste was applied to the dentures. Adjustments were made as needed. [place]). Occlusion evaluated and adjustments made as needed for Centric Relation and protrusive strokes. Patient still with tendency to protrude to end to end position. Patient was instructed in finding the maximum intercuspation position and found this with minimal difficulty. Patient accepts results. Patient to keep dentures out if sore spots develop. Use salt water rinses as needed to aid healing. Return to clinic as scheduled for denture adjustment.   Call if problems arise before then. 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 Patient dismissed in stable condition.  Collin Young, DDS

## 2014-05-01 NOTE — Patient Instructions (Signed)
Patient to keep dentures out if sore spots develop. Use salt water rinses as needed to aid healing. Return to clinic as scheduled for denture adjustment.   Call if problems arise before then.  Fallyn Munnerlyn F. Chanika Byland, DDS  

## 2014-05-11 ENCOUNTER — Telehealth: Payer: Self-pay | Admitting: *Deleted

## 2014-05-11 NOTE — Telephone Encounter (Signed)
Patient had called requesting refill on his MMW ,"I'm out and need refill",informed patient I would check into it and call him back before 5pm today", checked last rx written by MD 04/16/14 which had 1 refill , called [facility] and asked if the refill had been picked up as yet,"No, we can refill it todqay and patient can pick up before 6pm"thanked pharmacy tech, and called and informed patient he can pick up his refill before 6pm today,patient thanked this Rn for returning call so soon 3:11 PM

## 2014-05-21 ENCOUNTER — Ambulatory Visit (HOSPITAL_BASED_OUTPATIENT_CLINIC_OR_DEPARTMENT_OTHER): Payer: Medicaid Other | Admitting: Hematology & Oncology

## 2014-05-21 ENCOUNTER — Other Ambulatory Visit (HOSPITAL_BASED_OUTPATIENT_CLINIC_OR_DEPARTMENT_OTHER): Payer: Medicaid Other | Admitting: Lab

## 2014-05-21 ENCOUNTER — Ambulatory Visit: Payer: Medicaid Other

## 2014-05-21 ENCOUNTER — Encounter: Payer: Self-pay | Admitting: Hematology & Oncology

## 2014-05-21 VITALS — BP 139/81 | HR 68 | Temp 97.7°F | Resp 18 | Ht 65.0 in | Wt 114.0 lb

## 2014-05-21 DIAGNOSIS — C12 Malignant neoplasm of pyriform sinus: Secondary | ICD-10-CM | POA: Diagnosis not present

## 2014-05-21 DIAGNOSIS — D490 Neoplasm of unspecified behavior of digestive system: Secondary | ICD-10-CM

## 2014-05-21 LAB — CBC WITH DIFFERENTIAL (CANCER CENTER ONLY)
BASO#: 0 10*3/uL (ref 0.0–0.2)
BASO%: 0.3 % (ref 0.0–2.0)
EOS%: 2.6 % (ref 0.0–7.0)
Eosinophils Absolute: 0.1 10*3/uL (ref 0.0–0.5)
HCT: 37.9 % — ABNORMAL LOW (ref 38.7–49.9)
HGB: 12.6 g/dL — ABNORMAL LOW (ref 13.0–17.1)
LYMPH#: 0.7 10*3/uL — AB (ref 0.9–3.3)
LYMPH%: 18.7 % (ref 14.0–48.0)
MCH: 27.8 pg — ABNORMAL LOW (ref 28.0–33.4)
MCHC: 33.2 g/dL (ref 32.0–35.9)
MCV: 84 fL (ref 82–98)
MONO#: 0.7 10*3/uL (ref 0.1–0.9)
MONO%: 20.5 % — ABNORMAL HIGH (ref 0.0–13.0)
NEUT%: 57.9 % (ref 40.0–80.0)
NEUTROS ABS: 2 10*3/uL (ref 1.5–6.5)
Platelets: 216 10*3/uL (ref 145–400)
RBC: 4.54 10*6/uL (ref 4.20–5.70)
RDW: 14 % (ref 11.1–15.7)
WBC: 3.5 10*3/uL — AB (ref 4.0–10.0)

## 2014-05-21 LAB — CMP (CANCER CENTER ONLY)
ALT(SGPT): 18 U/L (ref 10–47)
AST: 21 U/L (ref 11–38)
Albumin: 3.1 g/dL — ABNORMAL LOW (ref 3.3–5.5)
Alkaline Phosphatase: 43 U/L (ref 26–84)
BUN, Bld: 18 mg/dL (ref 7–22)
CALCIUM: 9.3 mg/dL (ref 8.0–10.3)
CHLORIDE: 92 meq/L — AB (ref 98–108)
CO2: 28 mEq/L (ref 18–33)
CREATININE: 0.7 mg/dL (ref 0.6–1.2)
Glucose, Bld: 90 mg/dL (ref 73–118)
POTASSIUM: 3.7 meq/L (ref 3.3–4.7)
Sodium: 136 mEq/L (ref 128–145)
Total Bilirubin: 0.4 mg/dl (ref 0.20–1.60)
Total Protein: 7.6 g/dL (ref 6.4–8.1)

## 2014-05-21 LAB — LACTATE DEHYDROGENASE: LDH: 162 U/L (ref 94–250)

## 2014-05-21 MED ORDER — HEPARIN SOD (PORK) LOCK FLUSH 100 UNIT/ML IV SOLN
500.0000 [IU] | Freq: Once | INTRAVENOUS | Status: AC
  Administered 2014-05-21: 500 [IU] via INTRAVENOUS
  Filled 2014-05-21: qty 5

## 2014-05-21 MED ORDER — SODIUM CHLORIDE 0.9 % IJ SOLN
10.0000 mL | INTRAMUSCULAR | Status: DC | PRN
  Administered 2014-05-21: 10 mL via INTRAVENOUS
  Filled 2014-05-21: qty 10

## 2014-05-21 MED ORDER — FENTANYL 12 MCG/HR TD PT72: MEDICATED_PATCH | TRANSDERMAL | Status: DC

## 2014-05-21 NOTE — Patient Instructions (Signed)

## 2014-05-22 NOTE — Progress Notes (Signed)
Hematology and Oncology Follow Up Visit  Collin Young [id number] 12-06-1950 63 y.o. 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  Young is back for followup. His swallowing better. He's not really using the feeding tube. We will go ahead and give feeding tube taken out.  He's had no cough. He's had no nausea or vomiting. He's had no problems with bowels or bladder. He currently is on a Duragesic patch. He's on 12 mcg patch. We will continue him on this.  He's had no leg swelling. He's had no rashes. He's had no fever. There's been no bleeding.  Overall, his performance status is ECOG 1  Medications: Current outpatient prescriptions:Alum & Mag Hydroxide-Simeth (MAGIC MOUTHWASH W/LIDOCAINE) SOLN, Take 5 mLs by mouth 4 (four) times daily as needed for mouth pain., Disp: 250 mL, Rfl: 1;  emollient (BIAFINE) cream, Apply topically as needed., Disp: 454 g, Rfl: 0;  fentaNYL (DURAGESIC - DOSED MCG/HR) 12 MCG/HR, Place 1 patch every 3 days to the skin, Disp: 10 patch, Rfl: 0 LORazepam (ATIVAN) 0.5 MG tablet, Take 0.5 mg by mouth. NOT TAKING, Disp: , Rfl: ;  morphine (ROXANOL) 20 MG/ML concentrated solution, Take 0.5 mLs (10 mg total) by mouth every 3 (three) hours as needed for severe pain., Disp: 60 mL, Rfl: 0;  Nutritional Supplements (BOOST HIGH PROTEIN PO), Take by mouth 6 (six) times daily., Disp: , Rfl:  nystatin (MYCOSTATIN) 100000 UNIT/ML suspension, Take 5 mLs by mouth 4 (four) times daily. NOT TAKING, Disp: , Rfl: ;  ondansetron (ZOFRAN) 8 MG tablet, Take 8 mg by mouth. NOT TAKING, Disp: , Rfl: ;  scopolamine (TRANSDERM-SCOP) 1 MG/3DAYS, Place 1 patch onto the skin every 3 (three) days. NOT USING, Disp: , Rfl: ;  Sennosides (EX-LAX PO), Take by mouth 2 (two) times daily., Disp: , Rfl:  prochlorperazine  (COMPAZINE) 10 MG tablet, Take by mouth. NOT  TAKING, Disp: , Rfl:  No current facility-administered medications for this visit. Facility-Administered Medications Ordered in Other Visits: topical emolient (BIAFINE) emulsion, , Topical, Daily, Marye Round, MD  Allergies: No Known Allergies  Past Medical History, Surgical history, Social history, and Family History were reviewed and updated.  Review of Systems: As above  Physical Exam:  height is 5\' 5"  (1.651 m) and weight is 114 lb (51.71 kg). His oral temperature is 97.7 F (36.5 C). His blood pressure is 139/81 and his pulse is 68. His respiration is 18.   Thin, African American gentleman. Head and neck exam shows no ocular or oral lesions. There is no adenopathy in the neck. Lungs are clear. Cardiac exam regular in rhythm. Abdomen soft. Has good bowel sounds. The T-tube is intact. There is no erythema or swelling about the feeding tube. Neck exam shows no tenderness over the spine, ribs or hips. Extremities shows no clubbing, cyanosis or edema. Has good strength in his legs. Skin exam no rashes. Neurological exam is nonfocal.  Lab Results  Component Value Date   WBC 3.5* 05/21/2014   HGB 12.6* 05/21/2014   HCT 37.9* 05/21/2014   MCV 84 05/21/2014   PLT 216 05/21/2014     Chemistry      Component Value Date/Time   NA 136 05/21/2014 0937   NA 128* 01/28/2014 1225   K 3.7 05/21/2014 0937   K  4.0 01/28/2014 1225   CL 92* 05/21/2014 0937   CL 90* 01/28/2014 1225   CO2 28 05/21/2014 0937   CO2 28 01/28/2014 1225   BUN 18 05/21/2014 0937   BUN 14 01/28/2014 1225   CREATININE 0.7 05/21/2014 0937   CREATININE 0.63 01/28/2014 1225      Component Value Date/Time   CALCIUM 9.3 05/21/2014 0937   CALCIUM 8.9 01/28/2014 1225   ALKPHOS 43 05/21/2014 0937   ALKPHOS 49 01/28/2014 1225   AST 21 05/21/2014 0937   AST 20 01/28/2014 1225   ALT 18 05/21/2014 0937   ALT 13 01/28/2014 1225   BILITOT 0.40 05/21/2014 0937   BILITOT 0.3 01/28/2014 1225          Impression and Plan: Collin Young is 63 year old gentleman. He has a localized source for carcinoma of the left pyriform sinus. He underwent concurrent chemotherapy and radiation therapy. He finished his treatment back in April. His skin done back in July. There is no evidence of residual disease.  We will go ahead and get his PCP. I will see that he needs this.  I will plan to do a followup MRI in about 2 months.  I'll plan to see him back afterwards.  He does have a Port-A-Cath in. We will keep this in for right now. [name], MD 9/22/20156:54 AM

## 2014-05-24 ENCOUNTER — Telehealth: Payer: Self-pay | Admitting: Hematology & Oncology

## 2014-05-24 ENCOUNTER — Other Ambulatory Visit: Payer: Self-pay | Admitting: Nurse Practitioner

## 2014-05-24 DIAGNOSIS — C12 Malignant neoplasm of pyriform sinus: Secondary | ICD-10-CM

## 2014-05-24 MED ORDER — MORPHINE SULFATE (CONCENTRATE) 20 MG/ML PO SOLN: 10.0000 mg | ORAL | Status: DC | PRN

## 2014-05-24 NOTE — Telephone Encounter (Signed)
Pt aware of 11-3 appointment. MD is aware no orders in epic to schedule MRI

## 2014-05-29 ENCOUNTER — Telehealth: Payer: Self-pay | Admitting: Hematology & Oncology

## 2014-05-29 NOTE — Telephone Encounter (Signed)
MD aware awaiting orders to schedule MRI

## 2014-05-30 ENCOUNTER — Other Ambulatory Visit: Payer: Self-pay | Admitting: Hematology & Oncology

## 2014-05-30 DIAGNOSIS — C12 Malignant neoplasm of pyriform sinus: Secondary | ICD-10-CM

## 2014-06-01 ENCOUNTER — Telehealth: Payer: Self-pay | Admitting: Hematology & Oncology

## 2014-06-01 ENCOUNTER — Other Ambulatory Visit: Payer: Self-pay | Admitting: *Deleted

## 2014-06-01 DIAGNOSIS — C12 Malignant neoplasm of pyriform sinus: Secondary | ICD-10-CM

## 2014-06-01 NOTE — Telephone Encounter (Signed)
Talked with Jenifer at IR. Pt aware they will call to schedule

## 2014-06-05 ENCOUNTER — Ambulatory Visit (HOSPITAL_COMMUNITY)
Admission: RE | Admit: 2014-06-05 | Discharge: 2014-06-05 | Disposition: A | Discharge status: Home / Self Care | Payer: Medicaid Other | Source: Ambulatory Visit | Attending: Hematology & Oncology | Admitting: Hematology & Oncology

## 2014-06-05 ENCOUNTER — Ambulatory Visit (HOSPITAL_COMMUNITY): Payer: Medicaid - Dental | Admitting: Dentistry

## 2014-06-05 ENCOUNTER — Encounter (HOSPITAL_COMMUNITY): Payer: Self-pay | Admitting: Dentistry

## 2014-06-05 VITALS — BP 127/62 | HR 80 | Temp 98.1°F

## 2014-06-05 DIAGNOSIS — K117 Disturbances of salivary secretion: Secondary | ICD-10-CM

## 2014-06-05 DIAGNOSIS — Z463 Encounter for fitting and adjustment of dental prosthetic device: Secondary | ICD-10-CM

## 2014-06-05 DIAGNOSIS — C12 Malignant neoplasm of pyriform sinus: Secondary | ICD-10-CM | POA: Insufficient documentation

## 2014-06-05 DIAGNOSIS — Z9221 Personal history of antineoplastic chemotherapy: Secondary | ICD-10-CM

## 2014-06-05 DIAGNOSIS — Z431 Encounter for attention to gastrostomy: Secondary | ICD-10-CM | POA: Insufficient documentation

## 2014-06-05 DIAGNOSIS — R682 Dry mouth, unspecified: Secondary | ICD-10-CM

## 2014-06-05 DIAGNOSIS — Z972 Presence of dental prosthetic device (complete) (partial): Secondary | ICD-10-CM

## 2014-06-05 DIAGNOSIS — K08109 Complete loss of teeth, unspecified cause, unspecified class: Secondary | ICD-10-CM

## 2014-06-05 DIAGNOSIS — Z923 Personal history of irradiation: Secondary | ICD-10-CM

## 2014-06-05 DIAGNOSIS — K000 Anodontia: Secondary | ICD-10-CM

## 2014-06-05 DIAGNOSIS — K0820 Unspecified atrophy of edentulous alveolar ridge: Secondary | ICD-10-CM

## 2014-06-05 MED ORDER — LIDOCAINE VISCOUS 2 % MT SOLN
15.0000 mL | Freq: Once | OROMUCOSAL | Status: AC
Start: 2014-06-05 — End: 2014-06-05
  Administered 2014-06-05: 10 mL via OROMUCOSAL

## 2014-06-05 MED ORDER — LIDOCAINE VISCOUS 2 % MT SOLN
OROMUCOSAL | Status: AC
  Filled 2014-06-05: qty 15

## 2014-06-05 NOTE — Procedures (Signed)
Successful removal of 20 french pull thru gastrostomy tube was performed without immediate complications. Gauze dressing was applied to site. Medication utilized- viscous lidocaine into G tube insertion site tract.

## 2014-06-05 NOTE — Progress Notes (Signed)
06/05/2014  Patient:            Collin Young Date of Birth:  [birth year] MRN:                [id number]  BP 127/62  Pulse 80  Temp(Src) 98.1 F (36.7 C) (Oral)  [name] presents for evaluation of recently inserted upper and lower complete dentures.   SUBJECTIVE: Patient is not complaining of any denture irritation. "I am able to eat anything I want" OBJECTIVE: There is no sign of denture irritation or erythema. Procedure: Pressure indicating paste was applied to the dentures. Adjustments were made as needed. [place] Occlusion evaluated and adjustments made as needed for Centric Relation and protrusive strokes. Patient still with tendency to protrude to end to end position. Patient was again instructed in finding the maximum intercuspation position. Patient accepts results. Patient to keep dentures out if sore spots develop. Use salt water rinses as needed to aid healing. Return to clinic as scheduled for denture adjustment.   Call if problems arise before then. Patient is aware of the potential for problems secondary to his xerostomia and previous radiation therapy. Patient dismissed in stable condition.  Lenn Cal, DDS

## 2014-06-05 NOTE — Patient Instructions (Signed)
Patient to keep dentures out if sore spots develop. Use salt water rinses as needed to aid healing. Return to clinic as scheduled for denture adjustment.   Call if problems arise before then. Patient is aware of the potential for problems secondary to his xerostomia and previous radiation therapy. Patient dismissed in stable condition.  Lenn Cal, DDS

## 2014-06-18 ENCOUNTER — Other Ambulatory Visit: Payer: Self-pay | Admitting: Nurse Practitioner

## 2014-06-18 DIAGNOSIS — C12 Malignant neoplasm of pyriform sinus: Secondary | ICD-10-CM

## 2014-06-18 MED ORDER — MORPHINE SULFATE (CONCENTRATE) 20 MG/ML PO SOLN: 10.0000 mg | ORAL | Status: DC | PRN

## 2014-06-19 ENCOUNTER — Other Ambulatory Visit: Payer: Self-pay | Admitting: *Deleted

## 2014-06-19 DIAGNOSIS — C12 Malignant neoplasm of pyriform sinus: Secondary | ICD-10-CM

## 2014-06-19 MED ORDER — FENTANYL 12 MCG/HR TD PT72: MEDICATED_PATCH | TRANSDERMAL | Status: DC

## 2014-06-21 ENCOUNTER — Ambulatory Visit
Admission: RE | Admit: 2014-06-21 | Discharge: 2014-06-21 | Disposition: A | Discharge status: Home / Self Care | Payer: Medicaid Other | Source: Ambulatory Visit | Attending: Radiation Oncology | Admitting: Radiation Oncology

## 2014-06-21 VITALS — BP 126/64 | HR 72 | Temp 98.4°F | Resp 16 | Wt 113.7 lb

## 2014-06-21 DIAGNOSIS — C12 Malignant neoplasm of pyriform sinus: Secondary | ICD-10-CM | POA: Insufficient documentation

## 2014-06-21 DIAGNOSIS — Z510 Encounter for antineoplastic radiation therapy: Secondary | ICD-10-CM | POA: Diagnosis not present

## 2014-06-21 DIAGNOSIS — K117 Disturbances of salivary secretion: Secondary | ICD-10-CM | POA: Insufficient documentation

## 2014-06-21 MED ORDER — LARYNGOSCOPY SOLUTION RAD-ONC
15.0000 mL | Freq: Once | TOPICAL | Status: AC
  Administered 2014-06-21: 15 mL via TOPICAL
  Filled 2014-06-21: qty 15

## 2014-06-21 NOTE — Progress Notes (Signed)
Reports painful swallowing and eating a pureed diet.  He reports continues to drink Ensure.  Oral exam reveals moist mucous membranes with a small amount of white exudate on tongue. Noted hyperpigmentation over neck and denies sensitivity.

## 2014-06-22 ENCOUNTER — Encounter: Payer: Self-pay | Admitting: Radiation Oncology

## 2014-06-22 NOTE — Progress Notes (Signed)
Radiation Oncology         [phone] ________________________________  Name: LESSLIE MCKEEHAN MRN: [id number]  Date: 06/21/2014  DOB: [birth year]  Follow-Up Visit Note  CC: Myriam Jacobson, MD  [name], MD  Diagnosis:   Squamous cell carcinoma of the pyriform sinus  Interval Since Last Radiation:  The patient completed radiation treatment on 12/05/2013   Narrative:  The patient returns today for routine follow-up.  The patient states that he is doing much better than when he was last seen. He has continued to recover overall. He does use a blender for some foods as he is not able to be everything that he would like. He does complain of xerostomia. He is using biatene gel with some relief.                              ALLERGIES:  has No Known Allergies.  Meds: Current Outpatient Prescriptions  Medication Sig Dispense Refill  . Alum & Mag Hydroxide-Simeth (MAGIC MOUTHWASH W/LIDOCAINE) SOLN Take 5 mLs by mouth 4 (four) times daily as needed for mouth pain.  250 mL  1  . emollient (BIAFINE) cream Apply topically as needed.  454 g  0  . fentaNYL (DURAGESIC - DOSED MCG/HR) 12 MCG/HR Place 1 patch every 3 days to the skin  10 patch  0  . LORazepam (ATIVAN) 0.5 MG tablet Take 0.5 mg by mouth. NOT TAKING      . morphine (ROXANOL) 20 MG/ML concentrated solution Take 0.5 mLs (10 mg total) by mouth every 3 (three) hours as needed for severe pain.  60 mL  0  . Nutritional Supplements (BOOST HIGH PROTEIN PO) Take by mouth 6 (six) times daily.      Marland Kitchen nystatin (MYCOSTATIN) 100000 UNIT/ML suspension Take 5 mLs by mouth 4 (four) times daily. NOT TAKING      . ondansetron (ZOFRAN) 8 MG tablet Take 8 mg by mouth. NOT TAKING      . prochlorperazine (COMPAZINE) 10 MG tablet Take by mouth. NOT  TAKING      . scopolamine (TRANSDERM-SCOP) 1 MG/3DAYS Place 1 patch onto the skin every 3 (three) days. NOT USING      . Sennosides (EX-LAX PO) Take by mouth 2 (two) times daily.       No current  facility-administered medications for this encounter.   Facility-Administered Medications Ordered in Other Encounters  Medication Dose Route Frequency Provider Last Rate Last Dose  . topical emolient (BIAFINE) emulsion   Topical Daily Marye Round, MD        Physical Findings: The patient is in no acute distress. Patient is alert and oriented.  weight is 113 lb 11.2 oz (51.574 kg). His oral temperature is 98.4 F (36.9 C). His blood pressure is 126/64 and his pulse is 72. His respiration is 16 and oxygen saturation is 98%. .   Oral cavity clear No cervical lymphadenopathy  Fiberoptic exam: After the use of topical anesthetic, the flexible laryngoscope was passed through the right near. Good visualization was obtained. No lesions or suspicious findings within the larynx, hypopharynx, oropharynx or nasopharynx.   Lab Findings: Lab Results  Component Value Date   WBC 3.5* 05/21/2014   HGB 12.6* 05/21/2014   HCT 37.9* 05/21/2014   MCV 84 05/21/2014   PLT 216 05/21/2014     Radiographic Findings: Ir Gastrostomy Tube Removal  06/05/2014   CLINICAL DATA:  Patient with history  of head and neck cancer at the piriform sinus and prior 20 French pull-through gastrostomy tube placement on 10/03/2013. The patient is currently eating and request is now made for gastrostomy tube removal.  EXAM: IR TUBE REMOVAL GASTROSTOMY  MEDICATIONS: Viscous lidocaine into the gastrostomy tube insertion site tract  CONTRAST:  None  FLUOROSCOPY TIME:  None  PROCEDURE: Following informed consent the patient was placed supine on a stretcher. Viscous lidocaine was applied into the gastrostomy tube insertion site tract. The gastrostomy tube was removed in its entirety with manual traction. A gauze dressing was applied over the site.  Complications: None  FINDINGS: Clean, intact gastrostomy tube site with small amount of granulation tissue.  IMPRESSION: Successful removal of 20 French pull-through gastrostomy tube.  Read  by: Rowe Robert, PA-C   Electronically Signed   By: Arne Cleveland M.D.   On: 06/05/2014 11:23    Impression:    The patient clinically is doing well. No suspicious findings on exam today.  Plan:  Followup in 6 months.  I spent 15 minutes with the patient today, the majority of which was spent counseling the patient on the diagnosis of cancer and coordinating care.   Jodelle Gross, M.D., Ph.D.

## 2014-07-03 ENCOUNTER — Ambulatory Visit (HOSPITAL_BASED_OUTPATIENT_CLINIC_OR_DEPARTMENT_OTHER): Payer: Medicaid Other

## 2014-07-03 VITALS — BP 140/72 | HR 81 | Temp 97.9°F | Resp 18

## 2014-07-03 DIAGNOSIS — C12 Malignant neoplasm of pyriform sinus: Secondary | ICD-10-CM

## 2014-07-03 DIAGNOSIS — Z452 Encounter for adjustment and management of vascular access device: Secondary | ICD-10-CM

## 2014-07-03 DIAGNOSIS — C801 Malignant (primary) neoplasm, unspecified: Secondary | ICD-10-CM

## 2014-07-03 MED ORDER — SODIUM CHLORIDE 0.9 % IJ SOLN
10.0000 mL | INTRAMUSCULAR | Status: DC | PRN
  Administered 2014-07-03: 10 mL via INTRAVENOUS
  Filled 2014-07-03: qty 10

## 2014-07-03 MED ORDER — HEPARIN SOD (PORK) LOCK FLUSH 100 UNIT/ML IV SOLN
500.0000 [IU] | Freq: Once | INTRAVENOUS | Status: AC
  Administered 2014-07-03: 500 [IU] via INTRAVENOUS
  Filled 2014-07-03: qty 5

## 2014-07-03 NOTE — Patient Instructions (Signed)

## 2014-07-09 ENCOUNTER — Other Ambulatory Visit: Payer: Self-pay | Admitting: *Deleted

## 2014-07-10 ENCOUNTER — Encounter (HOSPITAL_COMMUNITY): Payer: Self-pay | Admitting: Emergency Medicine

## 2014-07-10 ENCOUNTER — Emergency Department (HOSPITAL_COMMUNITY): Payer: Medicaid Other

## 2014-07-10 ENCOUNTER — Emergency Department (HOSPITAL_COMMUNITY)
Admission: EM | Admit: 2014-07-10 | Discharge: 2014-07-10 | Disposition: A | Discharge status: Home / Self Care | Payer: Medicaid Other | Attending: Emergency Medicine | Admitting: Emergency Medicine

## 2014-07-10 DIAGNOSIS — Z8501 Personal history of malignant neoplasm of esophagus: Secondary | ICD-10-CM | POA: Insufficient documentation

## 2014-07-10 DIAGNOSIS — Z87891 Personal history of nicotine dependence: Secondary | ICD-10-CM | POA: Diagnosis not present

## 2014-07-10 DIAGNOSIS — J159 Unspecified bacterial pneumonia: Secondary | ICD-10-CM | POA: Diagnosis not present

## 2014-07-10 DIAGNOSIS — Z79899 Other long term (current) drug therapy: Secondary | ICD-10-CM | POA: Diagnosis not present

## 2014-07-10 DIAGNOSIS — R079 Chest pain, unspecified: Secondary | ICD-10-CM

## 2014-07-10 DIAGNOSIS — H9190 Unspecified hearing loss, unspecified ear: Secondary | ICD-10-CM | POA: Diagnosis not present

## 2014-07-10 DIAGNOSIS — I10 Essential (primary) hypertension: Secondary | ICD-10-CM | POA: Diagnosis not present

## 2014-07-10 DIAGNOSIS — Z923 Personal history of irradiation: Secondary | ICD-10-CM | POA: Diagnosis not present

## 2014-07-10 DIAGNOSIS — Z8639 Personal history of other endocrine, nutritional and metabolic disease: Secondary | ICD-10-CM | POA: Diagnosis not present

## 2014-07-10 DIAGNOSIS — Z8719 Personal history of other diseases of the digestive system: Secondary | ICD-10-CM | POA: Diagnosis not present

## 2014-07-10 DIAGNOSIS — J189 Pneumonia, unspecified organism: Secondary | ICD-10-CM

## 2014-07-10 LAB — CBC WITH DIFFERENTIAL/PLATELET
BASOS ABS: 0 10*3/uL (ref 0.0–0.1)
BASOS PCT: 0 % (ref 0–1)
EOS PCT: 0 % (ref 0–5)
Eosinophils Absolute: 0 10*3/uL (ref 0.0–0.7)
HEMATOCRIT: 36.4 % — AB (ref 39.0–52.0)
Hemoglobin: 11.9 g/dL — ABNORMAL LOW (ref 13.0–17.0)
LYMPHS PCT: 15 % (ref 12–46)
Lymphs Abs: 1 10*3/uL (ref 0.7–4.0)
MCH: 27.5 pg (ref 26.0–34.0)
MCHC: 32.7 g/dL (ref 30.0–36.0)
MCV: 84.3 fL (ref 78.0–100.0)
Monocytes Absolute: 1.1 10*3/uL — ABNORMAL HIGH (ref 0.1–1.0)
Monocytes Relative: 15 % — ABNORMAL HIGH (ref 3–12)
Neutro Abs: 4.9 10*3/uL (ref 1.7–7.7)
Neutrophils Relative %: 70 % (ref 43–77)
PLATELETS: 207 10*3/uL (ref 150–400)
RBC: 4.32 MIL/uL (ref 4.22–5.81)
RDW: 14.2 % (ref 11.5–15.5)
WBC: 7 10*3/uL (ref 4.0–10.5)

## 2014-07-10 LAB — BASIC METABOLIC PANEL
ANION GAP: 11 (ref 5–15)
BUN: 14 mg/dL (ref 6–23)
CO2: 26 mEq/L (ref 19–32)
Calcium: 9.1 mg/dL (ref 8.4–10.5)
Chloride: 91 mEq/L — ABNORMAL LOW (ref 96–112)
Creatinine, Ser: 0.69 mg/dL (ref 0.50–1.35)
GFR calc Af Amer: >90 mL/min (ref >90)
GFR calc non Af Amer: >90 mL/min (ref >90)
Glucose, Bld: 100 mg/dL — ABNORMAL HIGH (ref 70–99)
Potassium: 4.1 mEq/L (ref 3.7–5.3)
SODIUM: 128 meq/L — AB (ref 137–147)

## 2014-07-10 LAB — I-STAT TROPONIN, ED: TROPONIN I, POC: 0.04 ng/mL (ref 0.00–0.08)

## 2014-07-10 LAB — PRO B NATRIURETIC PEPTIDE: Pro B Natriuretic peptide (BNP): 1803 pg/mL — ABNORMAL HIGH (ref 0–125)

## 2014-07-10 MED ORDER — DEXTROSE 5 % IV SOLN
500.0000 mg | Freq: Once | INTRAVENOUS | Status: AC
  Administered 2014-07-10: 500 mg via INTRAVENOUS
  Filled 2014-07-10: qty 500

## 2014-07-10 MED ORDER — HEPARIN SOD (PORK) LOCK FLUSH 100 UNIT/ML IV SOLN
500.0000 [IU] | Freq: Once | INTRAVENOUS | Status: AC
  Administered 2014-07-10: 500 [IU]
  Filled 2014-07-10: qty 5

## 2014-07-10 MED ORDER — ASPIRIN 325 MG PO TABS
325.0000 mg | ORAL_TABLET | Freq: Once | ORAL | Status: AC
  Administered 2014-07-10: 325 mg via ORAL
  Filled 2014-07-10: qty 1

## 2014-07-10 MED ORDER — SODIUM CHLORIDE 0.9 % IV BOLUS (SEPSIS)
500.0000 mL | Freq: Once | INTRAVENOUS | Status: AC
Start: 2014-07-10 — End: 2014-07-10
  Administered 2014-07-10: 500 mL via INTRAVENOUS

## 2014-07-10 MED ORDER — OXYCODONE-ACETAMINOPHEN 5-325 MG PO TABS
1.0000 | ORAL_TABLET | Freq: Once | ORAL | Status: AC
  Administered 2014-07-10: 1 via ORAL
  Filled 2014-07-10: qty 1

## 2014-07-10 MED ORDER — DEXTROSE 5 % IV SOLN
1.0000 g | Freq: Once | INTRAVENOUS | Status: AC
  Administered 2014-07-10: 1 g via INTRAVENOUS
  Filled 2014-07-10: qty 10

## 2014-07-10 MED ORDER — AZITHROMYCIN 250 MG PO TABS: ORAL_TABLET | ORAL | Status: DC

## 2014-07-10 MED ORDER — HYDROMORPHONE HCL 1 MG/ML IJ SOLN
1.0000 mg | Freq: Once | INTRAMUSCULAR | Status: AC
  Administered 2014-07-10: 1 mg via INTRAVENOUS
  Filled 2014-07-10: qty 1

## 2014-07-10 MED ORDER — SODIUM CHLORIDE 0.9 % IJ SOLN
INTRAMUSCULAR | Status: AC
  Administered 2014-07-10: 10 mL
  Filled 2014-07-10: qty 10

## 2014-07-10 NOTE — ED Notes (Signed)
Pt awaiting completion of antibiotic infusion prior to discharge.

## 2014-07-10 NOTE — Discharge Instructions (Signed)

## 2014-07-10 NOTE — ED Notes (Signed)
Pt reports left thoracic pain that gets worse with inspiration. Hx of esophageal cancer. Port on right chest. Pt denies sob or lightheadedness. Pt is alert and oriented.

## 2014-07-10 NOTE — ED Provider Notes (Signed)
CSN: [id number]     Arrival date & time 07/10/14  1128 History   First MD Initiated Contact with Patient 07/10/14 1147     Chief Complaint  Patient presents with  . Chest Pain    thoracic     (Consider location/radiation/quality/duration/timing/severity/associated sxs/prior Treatment) Patient is a 63 y.o. male presenting with chest pain. The history is provided by the patient. No language interpreter was used.  Chest Pain Pain location:  L lateral chest Pain quality: aching   Pain radiates to:  Does not radiate Pain radiates to the back: no   Pain severity:  Moderate Onset quality:  Unable to specify Duration:  8 hours Timing:  Constant Progression:  Unchanged Chronicity:  New Context: at rest   Relieved by:  Nothing Worsened by:  Deep breathing, certain positions and coughing Ineffective treatments:  None tried Associated symptoms: cough   Associated symptoms: no abdominal pain, no back pain, no diaphoresis, no dizziness, no dysphagia, no fatigue, no fever, no headache, no lower extremity edema, no nausea, no numbness, no shortness of breath, not vomiting and no weakness   Cough:    Cough characteristics:  Non-productive   Sputum characteristics:  Nondescript (chronic) Risk factors: high cholesterol, hypertension and male sex   Risk factors: no birth control, no coronary artery disease, no diabetes mellitus, no Ehlers-Danlos syndrome, no immobilization, no Marfan's syndrome, not obese, not pregnant, no prior DVT/PE, no smoking and no surgery     Past Medical History  Diagnosis Date  . Hypertension   . Hyperlipemia   . HOH (hard of hearing)   . Seizures     alcohol withdrawls- 2001  . GERD (gastroesophageal reflux disease)     takes zantac prn  . Cancer     Esophagus- radiation 2011  . History of radiation therapy 10/19/2013-12/05/2013    pyriform sinus 69.96 Gy/82fx   Past Surgical History  Procedure Laterality Date  . No past surgeries    . Cardiovascular stress  test  10/09/09    normal nuclearr stress test, EF 57% [name])  . Laryngoscopy N/A 09/15/2013    Procedure: LARYNGOSCOPY;  Surgeon: [name], MD;  Location: [facility] OR;  Service: ENT;  Laterality: N/A;  direct laryngoscopy with biopsy and esophagoscopy   No family history on file. History  Substance Use Topics  . Smoking status: Former Smoker -- 1.00 packs/day for 40 years    Types: Cigarettes    Start date: 11/08/1960    Quit date: 09/01/1999  . Smokeless tobacco: Former Systems developer    Types: [place] date: 09/01/1999     Comment: quit in 2011  . Alcohol Use: No     Comment: quit in 2001    Review of Systems  Constitutional: Negative for fever, diaphoresis, activity change, appetite change and fatigue.  HENT: Negative for congestion, facial swelling, rhinorrhea and trouble swallowing.   Eyes: Negative for photophobia and pain.  Respiratory: Positive for cough. Negative for chest tightness and shortness of breath.   Cardiovascular: Positive for chest pain. Negative for leg swelling.  Gastrointestinal: Negative for nausea, vomiting, abdominal pain, diarrhea and constipation.  Endocrine: Negative for polydipsia and polyuria.  Genitourinary: Negative for dysuria, urgency, decreased urine volume and difficulty urinating.  Musculoskeletal: Negative for back pain and gait problem.  Skin: Negative for color change, rash and wound.  Allergic/Immunologic: Negative for immunocompromised state.  Neurological: Negative for dizziness, facial asymmetry, speech difficulty, weakness, numbness and headaches.  Psychiatric/Behavioral: Negative for confusion, decreased  concentration and agitation.      Allergies  Review of patient's allergies indicates no known allergies.  Home Medications   Prior to Admission medications   Medication Sig Start Date End Date Taking? Authorizing Provider  fentaNYL (DURAGESIC - DOSED MCG/HR) 12 MCG/HR Place 1 patch every 3 days to the skin 06/19/14  Yes Eliezer Bottom, NP  guaifenesin (ROBITUSSIN) 100 MG/5ML syrup Take 100 mg by mouth 3 (three) times daily as needed for cough.   Yes Historical Provider, MD  morphine (ROXANOL) 20 MG/ML concentrated solution Take 0.5 mLs (10 mg total) by mouth every 3 (three) hours as needed for severe pain. Patient taking differently: Take 10 mg by mouth every 4 (four) hours as needed for severe pain.  06/18/14  Yes Eliezer Bottom, NP  Multiple Vitamin (MULTIVITAMIN WITH MINERALS) TABS tablet Take 1 tablet by mouth daily.   Yes Historical Provider, MD  Sennosides (EX-LAX PO) Take 1 tablet by mouth daily as needed (for constipation).    Yes Historical Provider, MD  Alum & Mag Hydroxide-Simeth (MAGIC MOUTHWASH W/LIDOCAINE) SOLN Take 5 mLs by mouth 4 (four) times daily as needed for mouth pain. 04/17/14   Marye Round, MD  azithromycin (ZITHROMAX Z-PAK) 250 MG tablet 2 po day one, then 1 daily x 4 days 07/11/14   Ernestina Patches, MD  emollient (BIAFINE) cream Apply topically as needed. 12/05/13   Thea Silversmith, MD  LORazepam (ATIVAN) 0.5 MG tablet Take 0.5 mg by mouth. NOT TAKING 10/05/13   [name], MD  Nutritional Supplements (BOOST HIGH PROTEIN PO) Take by mouth 6 (six) times daily.    Historical Provider, MD  nystatin (MYCOSTATIN) 100000 UNIT/ML suspension Take 5 mLs by mouth 4 (four) times daily. NOT TAKING 02/02/14   Marye Round, MD  ondansetron (ZOFRAN) 8 MG tablet Take 8 mg by mouth. NOT TAKING 10/05/13   [name], MD  prochlorperazine (COMPAZINE) 10 MG tablet Take by mouth. NOT  TAKING 10/05/13   [name], MD  scopolamine (TRANSDERM-SCOP) 1 MG/3DAYS Place 1 patch onto the skin every 3 (three) days. NOT USING 01/26/14   [name], MD   BP 144/98 mmHg  Pulse 82  Temp(Src) 98.3 F (36.8 C) (Oral)  Resp 19  SpO2 100% Physical Exam  Constitutional: He is oriented to person, place, and time. He appears well-developed and well-nourished. No distress.  HENT:  Head: Normocephalic and  atraumatic.  Mouth/Throat: No oropharyngeal exudate.  Eyes: Pupils are equal, round, and reactive to light.  Neck: Normal range of motion. Neck supple.  Cardiovascular: Normal rate, regular rhythm and normal heart sounds.  Exam reveals no gallop and no friction rub.   No murmur heard. Pulmonary/Chest: Effort normal and breath sounds normal. No respiratory distress. He has no wheezes. He has no rales.  Abdominal: Soft. Bowel sounds are normal. He exhibits no distension and no mass. There is no tenderness. There is no rebound and no guarding.  Musculoskeletal: Normal range of motion. He exhibits no edema or tenderness.  Neurological: He is alert and oriented to person, place, and time.  Skin: Skin is warm and dry.  Psychiatric: He has a normal mood and affect.    ED Course  Procedures (including critical care time) Labs Review Labs Reviewed  CBC WITH DIFFERENTIAL - Abnormal; Notable for the following:    Hemoglobin 11.9 (*)    HCT 36.4 (*)    Monocytes Relative 15 (*)    Monocytes Absolute 1.1 (*)  All other components within normal limits  BASIC METABOLIC PANEL - Abnormal; Notable for the following:    Sodium 128 (*)    Chloride 91 (*)    Glucose, Bld 100 (*)    All other components within normal limits  PRO B NATRIURETIC PEPTIDE - Abnormal; Notable for the following:    Pro B Natriuretic peptide (BNP) 1803.0 (*)    All other components within normal limits  Randolm Idol, ED    Imaging Review Dg Chest Port 1 View  07/10/2014   CLINICAL DATA:  Chest pain.  EXAM: PORTABLE CHEST - 1 VIEW  COMPARISON:  12/03/2013.  FINDINGS: Power port catheter in good anatomic position. Mediastinum hilar structures normal. Heart size stable. Left lower lobe infiltrate consistent with pneumonia. No pleural effusion or pneumothorax. No acute bony abnormality.  IMPRESSION: 1. Prior port catheter stable position. 2. Left lower lobe infiltrate consistent with pneumonia.   Electronically Signed    By: Marcello Moores  Register   On: 07/10/2014 12:39     EKG Interpretation   Date/Time:  Tuesday July 10 2014 11:39:20 EST Ventricular Rate:  96 PR Interval:  197 QRS Duration: 85 QT Interval:  387 QTC Calculation: 489 R Axis:   24 Text Interpretation:  Sinus rhythm Probable left atrial enlargement Left  ventricular hypertrophy Confirmed by DOCHERTY  MD, MEGAN ([id number]) on  07/10/2014 11:47:20 AM      MDM   Final diagnoses:  Chest pain  Left sided chest pain  CAP (community acquired pneumonia)    Pt is a 63 y.o. male with Pmhx as above who presents with L lateral chest wall pain since about 4;30 am. Patient states pain is dull, constant without radiation, it is worse with deep breathing and coughing. He states he has a chronic cough and does have sputum in the mornings but that this is not been much worse recently. He denies fevers or chills. He is not short of breath.has had no lower extremity pain or edema. On physical exam vital signs are stable and patient is in no acute distress. Chest pain is not reproducible. Chest x-ray with left lower lobe pneumonia. White blood cell count is normal. BNP is somewhat elevated, troponin is normal. Patient was given IV Rocephin and azithromycin. He was ambulated in department and maintained 100% on RA while ambulating. Doubt acute coronary syndrome, doubt PE given he has no active cancer, no tachycardia, no hypoxia and no lower extremity edema. We'll place on Z-Pak and have recommended close outpatient follow-up with his PCP. Return precautions given for new or worsening symptoms including worsening pain, trouble breathing.    Ernestina Patches, MD 07/10/14 [phone]

## 2014-07-16 ENCOUNTER — Ambulatory Visit (HOSPITAL_COMMUNITY)
Admission: RE | Admit: 2014-07-16 | Discharge: 2014-07-16 | Disposition: A | Discharge status: Home / Self Care | Payer: Medicaid Other | Source: Ambulatory Visit | Attending: Hematology & Oncology | Admitting: Hematology & Oncology

## 2014-07-16 ENCOUNTER — Other Ambulatory Visit (HOSPITAL_COMMUNITY): Payer: Self-pay | Admitting: Hematology & Oncology

## 2014-07-16 ENCOUNTER — Other Ambulatory Visit: Payer: Self-pay | Admitting: Hematology & Oncology

## 2014-07-16 ENCOUNTER — Encounter: Payer: Self-pay | Admitting: Thoracic Surgery

## 2014-07-16 ENCOUNTER — Other Ambulatory Visit: Payer: Self-pay | Admitting: *Deleted

## 2014-07-16 DIAGNOSIS — C12 Malignant neoplasm of pyriform sinus: Secondary | ICD-10-CM

## 2014-07-16 DIAGNOSIS — Z9221 Personal history of antineoplastic chemotherapy: Secondary | ICD-10-CM | POA: Diagnosis not present

## 2014-07-16 DIAGNOSIS — Z923 Personal history of irradiation: Secondary | ICD-10-CM | POA: Diagnosis not present

## 2014-07-16 MED ORDER — FENTANYL 12 MCG/HR TD PT72: MEDICATED_PATCH | TRANSDERMAL | Status: DC

## 2014-07-16 MED ORDER — GADOBENATE DIMEGLUMINE 529 MG/ML IV SOLN
10.0000 mL | Freq: Once | INTRAVENOUS | Status: AC | PRN
  Administered 2014-07-16: 10 mL via INTRAVENOUS

## 2014-07-16 MED ORDER — MORPHINE SULFATE (CONCENTRATE) 20 MG/ML PO SOLN: 10.0000 mg | ORAL | Status: DC | PRN

## 2014-07-17 ENCOUNTER — Telehealth: Payer: Self-pay | Admitting: *Deleted

## 2014-07-17 NOTE — Telephone Encounter (Addendum)
Patient very happy with results.   ----- Message from [name], MD sent at 07/16/2014  2:30 PM EST ----- Call - no evidence of cancer!!!  Laurey Arrow

## 2014-08-04 ENCOUNTER — Emergency Department (HOSPITAL_COMMUNITY): Payer: Medicaid Other

## 2014-08-04 ENCOUNTER — Encounter (HOSPITAL_COMMUNITY): Payer: Self-pay | Admitting: Emergency Medicine

## 2014-08-04 ENCOUNTER — Emergency Department (HOSPITAL_COMMUNITY)
Admission: EM | Admit: 2014-08-04 | Discharge: 2014-08-04 | Disposition: A | Discharge status: Home / Self Care | Payer: Medicaid Other | Attending: Emergency Medicine | Admitting: Emergency Medicine

## 2014-08-04 DIAGNOSIS — R5383 Other fatigue: Secondary | ICD-10-CM | POA: Diagnosis not present

## 2014-08-04 DIAGNOSIS — R531 Weakness: Secondary | ICD-10-CM | POA: Diagnosis present

## 2014-08-04 DIAGNOSIS — Z923 Personal history of irradiation: Secondary | ICD-10-CM | POA: Diagnosis not present

## 2014-08-04 DIAGNOSIS — Z792 Long term (current) use of antibiotics: Secondary | ICD-10-CM | POA: Insufficient documentation

## 2014-08-04 DIAGNOSIS — I10 Essential (primary) hypertension: Secondary | ICD-10-CM | POA: Diagnosis not present

## 2014-08-04 DIAGNOSIS — Z8639 Personal history of other endocrine, nutritional and metabolic disease: Secondary | ICD-10-CM | POA: Diagnosis not present

## 2014-08-04 DIAGNOSIS — Z79899 Other long term (current) drug therapy: Secondary | ICD-10-CM | POA: Insufficient documentation

## 2014-08-04 DIAGNOSIS — Z87891 Personal history of nicotine dependence: Secondary | ICD-10-CM | POA: Diagnosis not present

## 2014-08-04 DIAGNOSIS — H9190 Unspecified hearing loss, unspecified ear: Secondary | ICD-10-CM | POA: Insufficient documentation

## 2014-08-04 DIAGNOSIS — Z8501 Personal history of malignant neoplasm of esophagus: Secondary | ICD-10-CM | POA: Insufficient documentation

## 2014-08-04 DIAGNOSIS — K219 Gastro-esophageal reflux disease without esophagitis: Secondary | ICD-10-CM | POA: Insufficient documentation

## 2014-08-04 HISTORY — DX: Headache, unspecified: R51.9

## 2014-08-04 HISTORY — DX: Headache: R51

## 2014-08-04 LAB — BASIC METABOLIC PANEL
ANION GAP: 13 (ref 5–15)
BUN: 15 mg/dL (ref 6–23)
CALCIUM: 10 mg/dL (ref 8.4–10.5)
CO2: 29 mEq/L (ref 19–32)
Chloride: 91 mEq/L — ABNORMAL LOW (ref 96–112)
Creatinine, Ser: 0.82 mg/dL (ref 0.50–1.35)
GFR calc Af Amer: >90 mL/min (ref >90)
GFR calc non Af Amer: >90 mL/min (ref >90)
GLUCOSE: 88 mg/dL (ref 70–99)
Potassium: 4.3 mEq/L (ref 3.7–5.3)
Sodium: 133 mEq/L — ABNORMAL LOW (ref 137–147)

## 2014-08-04 LAB — URINALYSIS, ROUTINE W REFLEX MICROSCOPIC
Bilirubin Urine: NEGATIVE
Glucose, UA: NEGATIVE mg/dL
Hgb urine dipstick: NEGATIVE
Ketones, ur: NEGATIVE mg/dL
NITRITE: NEGATIVE
PROTEIN: NEGATIVE mg/dL
Specific Gravity, Urine: 1.008 (ref 1.005–1.030)
Urobilinogen, UA: 1 mg/dL (ref 0.0–1.0)
pH: 6 (ref 5.0–8.0)

## 2014-08-04 LAB — CBC WITH DIFFERENTIAL/PLATELET
BASOS ABS: 0 10*3/uL (ref 0.0–0.1)
Basophils Relative: 0 % (ref 0–1)
EOS ABS: 0.2 10*3/uL (ref 0.0–0.7)
EOS PCT: 5 % (ref 0–5)
HCT: 42.3 % (ref 39.0–52.0)
Hemoglobin: 14 g/dL (ref 13.0–17.0)
LYMPHS PCT: 26 % (ref 12–46)
Lymphs Abs: 1.1 10*3/uL (ref 0.7–4.0)
MCH: 28.3 pg (ref 26.0–34.0)
MCHC: 33.1 g/dL (ref 30.0–36.0)
MCV: 85.6 fL (ref 78.0–100.0)
Monocytes Absolute: 0.9 10*3/uL (ref 0.1–1.0)
Monocytes Relative: 21 % — ABNORMAL HIGH (ref 3–12)
Neutro Abs: 2 10*3/uL (ref 1.7–7.7)
Neutrophils Relative %: 48 % (ref 43–77)
PLATELETS: 217 10*3/uL (ref 150–400)
RBC: 4.94 MIL/uL (ref 4.22–5.81)
RDW: 14.3 % (ref 11.5–15.5)
WBC: 4.3 10*3/uL (ref 4.0–10.5)

## 2014-08-04 LAB — URINE MICROSCOPIC-ADD ON

## 2014-08-04 NOTE — ED Provider Notes (Signed)
CSN: [id number]     Arrival date & time 08/04/14  1916 History   First MD Initiated Contact with Patient 08/04/14 1932     Chief Complaint  Patient presents with  . Weakness      HPI Pt was seen at 1940. Per pt, c/o gradual onset and persistence of constant generalized fatigue for the past 1 week. States he has had one or two very brief, fleeting episodes of "lightheadedness" over the past week. Pt states he "hopes I don't have pneumonia again." Denies CP/palpitations, no SOB/cough, no abd pain, no N/V/D, no fevers, no back pain, no focal motor weakness, no tingling/numbness in extremities.    Past Medical History  Diagnosis Date  . Hypertension   . Hyperlipemia   . HOH (hard of hearing)   . Seizures     alcohol withdrawls- 2001  . GERD (gastroesophageal reflux disease)     takes zantac prn  . Cancer     Esophagus- radiation 2011  . History of radiation therapy 10/19/2013-12/05/2013    pyriform sinus 69.96 Gy/35fx  . Headache    Past Surgical History  Procedure Laterality Date  . No past surgeries    . Cardiovascular stress test  10/09/09    normal nuclearr stress test, EF 57% [name])  . Laryngoscopy N/A 09/15/2013    Procedure: LARYNGOSCOPY;  Surgeon: [name], MD;  Location: [facility] OR;  Service: ENT;  Laterality: N/A;  direct laryngoscopy with biopsy and esophagoscopy    History  Substance Use Topics  . Smoking status: Former Smoker -- 1.00 packs/day for 40 years    Types: Cigarettes    Start date: 11/08/1960    Quit date: 09/01/1999  . Smokeless tobacco: Former Systems developer    Types: Adelino date: 09/01/1999     Comment: quit in 2011  . Alcohol Use: No     Comment: quit in 2001    Review of Systems ROS: Statement: All systems negative except as marked or noted in the HPI; Constitutional: Negative for fever and chills. +generalized fatigue.; ; Eyes: Negative for eye pain, redness and discharge. ; ; ENMT: Negative for ear pain, hoarseness, nasal congestion, sinus  pressure and sore throat. ; ; Cardiovascular: Negative for chest pain, palpitations, diaphoresis, dyspnea and peripheral edema. ; ; Respiratory: Negative for cough, wheezing and stridor. ; ; Gastrointestinal: Negative for nausea, vomiting, diarrhea, abdominal pain, blood in stool, hematemesis, jaundice and rectal bleeding. . ; ; Genitourinary: Negative for dysuria, flank pain and hematuria. ; ; Musculoskeletal: Negative for back pain and neck pain. Negative for swelling and trauma.; ; Skin: Negative for pruritus, rash, abrasions, blisters, bruising and skin lesion.; ; Neuro: Negative for headache and neck stiffness. Negative for weakness, altered level of consciousness , altered mental status, extremity weakness, paresthesias, involuntary movement, seizure and syncope.     Allergies  Review of patient's allergies indicates no known allergies.  Home Medications   Prior to Admission medications   Medication Sig Start Date End Date Taking? Authorizing Provider  fentaNYL (DURAGESIC - DOSED MCG/HR) 12 MCG/HR Place 1 patch every 3 days to the skin Patient taking differently: Place 12.5 mcg onto the skin every 3 (three) days. Place 1 patch every 3 days to the skin 07/16/14  Yes [name], MD  guaifenesin (ROBITUSSIN) 100 MG/5ML syrup Take 100 mg by mouth 3 (three) times daily as needed for cough.   Yes Historical Provider, MD  morphine (ROXANOL) 20 MG/ML concentrated solution Take 0.5 mLs (  10 mg total) by mouth every 3 (three) hours as needed for severe pain. 07/16/14  Yes [name], MD  Multiple Vitamin (MULTIVITAMIN WITH MINERALS) TABS tablet Take 1 tablet by mouth daily.   Yes Historical Provider, MD  Sennosides (EX-LAX PO) Take 1 tablet by mouth daily as needed (for constipation).    Yes Historical Provider, MD  Alum & Mag Hydroxide-Simeth (MAGIC MOUTHWASH W/LIDOCAINE) SOLN Take 5 mLs by mouth 4 (four) times daily as needed for mouth pain. Patient not taking: Reported on 08/04/2014 04/17/14    Marye Round, MD  azithromycin (ZITHROMAX Z-PAK) 250 MG tablet 2 po day one, then 1 daily x 4 days Patient not taking: Reported on 08/04/2014 07/11/14   Ernestina Patches, MD  emollient (BIAFINE) cream Apply topically as needed. Patient not taking: Reported on 08/04/2014 12/05/13   Thea Silversmith, MD   BP 141/80 mmHg  Pulse 99  Temp(Src) 98.2 F (36.8 C) (Oral)  Resp 16  Ht 5\' 6"  (1.676 m)  Wt 115 lb (52.164 kg)  BMI 18.57 kg/m2  SpO2 100% Physical Exam  1945: Physical examination:  Nursing notes reviewed; Vital signs and O2 SAT reviewed;  Constitutional: Well developed, Well nourished, Well hydrated, In no acute distress; Head:  Normocephalic, atraumatic; Eyes: EOMI, PERRL, No scleral icterus; ENMT: TM's clear bilat. Mouth and pharynx normal, Mucous membranes moist; Neck: Supple, Full range of motion, No lymphadenopathy; Cardiovascular: Regular rate and rhythm, No gallop; Respiratory: Breath sounds clear & equal bilaterally, No wheezes.  Speaking full sentences with ease, Normal respiratory effort/excursion; Chest: Nontender, Movement normal; Abdomen: Soft, Nontender, Nondistended, Normal bowel sounds; Genitourinary: No CVA tenderness; Extremities: Pulses normal, No tenderness, No edema, No calf edema or asymmetry.; Neuro: AA&Ox3, Major CN grossly intact.Speech clear.  No facial droop.  No nystagmus. Grips equal. Strength 5/5 equal bilat UE's and LE's.  DTR 2/4 equal bilat UE's and LE's.  No gross sensory deficits.  Normal cerebellar testing bilat UE's (finger-nose) and LE's (heel-shin).; Skin: Color normal, Warm, Dry.   ED Course  Procedures     EKG Interpretation None      MDM  MDM Reviewed: previous chart, nursing note and vitals Reviewed previous: labs Interpretation: labs and x-ray     Results for orders placed or performed during the hospital encounter of 08/04/14  Urinalysis, Routine w reflex microscopic  Result Value Ref Range   Color, Urine YELLOW YELLOW   APPearance  CLEAR CLEAR   Specific Gravity, Urine 1.008 1.005 - 1.030   pH 6.0 5.0 - 8.0   Glucose, UA NEGATIVE NEGATIVE mg/dL   Hgb urine dipstick NEGATIVE NEGATIVE   Bilirubin Urine NEGATIVE NEGATIVE   Ketones, ur NEGATIVE NEGATIVE mg/dL   Protein, ur NEGATIVE NEGATIVE mg/dL   Urobilinogen, UA 1.0 0.0 - 1.0 mg/dL   Nitrite NEGATIVE NEGATIVE   Leukocytes, UA TRACE (A) NEGATIVE  Basic metabolic panel  Result Value Ref Range   Sodium 133 (L) 137 - 147 mEq/L   Potassium 4.3 3.7 - 5.3 mEq/L   Chloride 91 (L) 96 - 112 mEq/L   CO2 29 19 - 32 mEq/L   Glucose, Bld 88 70 - 99 mg/dL   BUN 15 6 - 23 mg/dL   Creatinine, Ser 0.82 0.50 - 1.35 mg/dL   Calcium 10.0 8.4 - 10.5 mg/dL   GFR calc non Af Amer >90 >90 mL/min   GFR calc Af Amer >90 >90 mL/min   Anion gap 13 5 - 15  CBC with Differential  Result  Value Ref Range   WBC 4.3 4.0 - 10.5 K/uL   RBC 4.94 4.22 - 5.81 MIL/uL   Hemoglobin 14.0 13.0 - 17.0 g/dL   HCT 42.3 39.0 - 52.0 %   MCV 85.6 78.0 - 100.0 fL   MCH 28.3 26.0 - 34.0 pg   MCHC 33.1 30.0 - 36.0 g/dL   RDW 14.3 11.5 - 15.5 %   Platelets 217 150 - 400 K/uL   Neutrophils Relative % 48 43 - 77 %   Neutro Abs 2.0 1.7 - 7.7 K/uL   Lymphocytes Relative 26 12 - 46 %   Lymphs Abs 1.1 0.7 - 4.0 K/uL   Monocytes Relative 21 (H) 3 - 12 %   Monocytes Absolute 0.9 0.1 - 1.0 K/uL   Eosinophils Relative 5 0 - 5 %   Eosinophils Absolute 0.2 0.0 - 0.7 K/uL   Basophils Relative 0 0 - 1 %   Basophils Absolute 0.0 0.0 - 0.1 K/uL  Urine microscopic-add on  Result Value Ref Range   WBC, UA 0-2 <3 WBC/hpf   Bacteria, UA RARE RARE   Dg Chest 2 View 08/04/2014   CLINICAL DATA:  Initial evaluation for 58 left shoulder pain and mild cough, quit smoking 10 years ago.  EXAM: CHEST  2 VIEW  COMPARISON:  07/10/2014  FINDINGS: Right Port-A-Cath unchanged. Heart size and vascular pattern are normal. Hyperinflation consistent with COPD. Near complete resolution of left lower lobe infiltrate.  IMPRESSION: Mild  residual left lower lobe infiltrate, significantly improved from prior study. Recommend radiographic followup to ensure resolution.   Electronically Signed   By: Skipper Cliche M.D.   On: 08/04/2014 21:12    2125:  Pt is not orthostatic on VS. Pt has ambulated with steady gait. CXR with near complete resolution of previous infiltrate. No clear need for admission at this time. Pt states he "is ready to go home then." Dx and testing d/w pt.  Questions answered.  Verb understanding, agreeable to d/c home with outpt f/u.   Francine Graven, DO 08/07/14 2132

## 2014-08-04 NOTE — ED Notes (Signed)
Pt arrived to the ED with a complaint of weakness.  Pt states that he has been feel at a loss of energy for a week.  Pt also states he has felt lightheaded at times.  Pt is a cancer patient but his last treatment was 6 months ago.  Pt was seen three weeks ago and given antibiotics for pneumonia.

## 2014-08-04 NOTE — Discharge Instructions (Signed)
°Emergency Department Resource Guide °1) Find a Doctor and Pay Out of Pocket °Although you won't have to find out who is covered by your insurance plan, it is a good idea to ask around and get recommendations. You will then need to call the office and see if the doctor you have chosen will accept you as a new patient and what types of options they offer for patients who are self-pay. Some doctors offer discounts or will set up payment plans for their patients who do not have insurance, but you will need to ask so you aren't surprised when you get to your appointment. ° °2) Contact Your Local Health Department °Not all health departments have doctors that can see patients for sick visits, but many do, so it is worth a call to see if yours does. If you don't know where your local health department is, you can check in your phone book. The CDC also has a tool to help you locate your state's health department, and many state websites also have listings of all of their local health departments. ° °3) Find a Walk-in Clinic °If your illness is not likely to be very severe or complicated, you may want to try a walk in clinic. These are popping up all over the country in pharmacies, drugstores, and shopping centers. They're usually staffed by nurse practitioners or physician assistants that have been trained to treat common illnesses and complaints. They're usually fairly quick and inexpensive. However, if you have serious medical issues or chronic medical problems, these are probably not your best option. ° °No Primary Care Doctor: °- Call [facility] at  832-8000 - they can help you locate a primary care doctor that  accepts your insurance, provides certain services, etc. °- Physician Referral Service- 1-800-533-3463 ° °Chronic Pain Problems: °Organization         Address  Phone   Notes  °[facility]  (336) 297-2271 Patients need to be referred by their primary care doctor.  ° °Medication  Assistance: °Organization         Address  Phone   Notes  °[place] [facility] [address] °[place], [place] [address] (336) 641-8030 --Must be a resident of [place] °-- Must have NO insurance coverage whatsoever (no Medicaid/ Medicare, etc.) °-- The pt. MUST have a primary care doctor that directs their care regularly and follows them in the community °  °[facility]  (866) 331-1348   °[employer]  (888) 892-1162   ° °Agencies that provide inexpensive medical care: °Organization         Address  Phone   Notes  °[facility]  (336) 832-8035   °[facility]    (336) 832-7272   °[facility] [address] °[place], [place] [address] (336) 832-4777   °[facility] [address], °[place] (336) 271-4999   °[facility]    (336) 373-0678   °[facility]    (336) 272-1050   °[facility] ° [address], [place] Phone:  (336) 832-4444, Fax:  (336) 832-4440 Hours of Operation:  9 am - 6 pm, M-F.  Also accepts Medicaid/Medicare and self-pay.  ° [facility] ° [address], [place] Phone: (336) [phone], Fax: (336) [phone]. Hours of Operation:  8:30 am - 5:30 pm, M-F.  Also accepts Medicaid and self-pay.  °[facility] [place] [address], [place] Phone: (336) 878-6027   °[facility] [address], [place], [place] (336)723-1848, Ext. 123 Mondays & Thursdays: 7-9 AM.  First 15 patients are seen on a first come, first serve basis. °  ° °Medicaid-accepting [place] Providers: ° °Organization         Address  Phone   Notes  °[facility] [address], [place] (336) 641-2100 Also accepts self-pay patients.  °[facility] [address], [place] ° (336) 856-9996   °[facility] [address], [place]  (336) 288-8857   °[facility] [address] [place] [address], [place] (336) 299-7000   °Veita Bland [address], [place]  ° (336) 373-1557 Only accepts [place] Access Medicaid patients after they have their name applied to their card.  ° °Self-Pay (no insurance) in [place]: ° °Organization         Address  Phone   Notes  °Sickle Cell Patients, [facility] [address], [place] (336) 832-1970   °[facility] [address], [place] (336) 832-4400   °[facility] ° [address], [place] (336) 992-4800   °[facility]/Dr. Osei-Bonsu ° [address] [place] [address], [place] or [address], [place] (336) 841-8500 Phone number for both [place] and [place] locations is the same.  °[facility] [address], [place] (336) 299-0000   °[facility] [address] [place] [address], [place] or [address] (336) 852-7530 °(336) 878-2260   °[facility] [address], [place] (336) 350-1642, phone; (336) 294-5005, fax Sees patients 1st and 3rd Saturday of every month.  Must not qualify for public or private insurance (i.e. Medicaid, Medicare, [place] Health Choice, Veterans' Benefits) • Household income should be no more than 200% of the poverty level •The clinic cannot treat you if you are pregnant or think you are pregnant • Sexually transmitted diseases are not treated at the clinic.  ° ° °Dental Care: °Organization         Address  Phone  Notes  °[place] [facility] [address], [place] (336) 641-6152 Accepts children up to age 21 who are enrolled in Medicaid or [place] Health Choice; pregnant women with a Medicaid card; and children who have applied for Medicaid or [place] Health Choice, but were declined, whose parents can pay a reduced fee at time of service.  °[place]  [facility] [place]  [address], [place] (336) 641-7733 Accepts children up to age 21 who are enrolled in Medicaid or  Health Choice; pregnant women with a Medicaid card; and children who have applied for Medicaid or  Health Choice, but were declined, whose parents can pay a reduced fee at time of service.  °[facility] ° [address], [place] (336) 641-4533 Patients are seen by appointment only. Walk-ins are not accepted. [facility] will see patients 18 years of age and older. °Monday - Tuesday (8am-5pm) °Most Wednesdays (8:30-5pm) °$30 per visit, cash only  °[facility] ° [address], [place] (336) 641-4533 Patients are seen by appointment only. Walk-ins are not accepted. [facility] will see patients 18 years of age and older. °One   Wednesday Evening (Monthly: Volunteer Based).  $30 per visit, cash only  °[facility]  (919) 537-3737 for adults; Children under age 4, call Graduate Pediatric Dentistry at (919) [phone]. Children aged 4-14, please call (919) [phone] to request a pediatric application. ° Dental services are provided in all areas of dental care including fillings, crowns and bridges, complete and partial dentures, implants, gum treatment, root canals, and extractions. Preventive care is also provided. Treatment is provided to both adults and children. °Patients are selected via a lottery and there is often a waiting list. °  °[facility] [address], °[place] ° (336) 763-8833 [email or web address] °  °[facility] [address], [place], [place] (336)723-1848, Ext. 123 Second and Fourth Thursday of each month, opens at 6:30 AM; Clinic ends at 9 AM.  Patients are seen on a first-come first-served basis, and a limited number are seen during each clinic.  ° °[facility] ° [address], [place], [place] (336) 723-7904    Eligibility Requirements °You must have lived in [place], [place], or [place] counties for at least the last three months. °  You cannot be eligible for state or federal sponsored healthcare insurance, including Veterans Administration, Medicaid, or Medicare. °  You generally cannot be eligible for healthcare insurance through your employer.  °  How to apply: °Eligibility screenings are held every Tuesday and Wednesday afternoon from 1:00 pm until 4:00 pm. You do not need an appointment for the interview!  °[facility] [address], [place], [place] 336-631-2330   °[facility]  336-342-8273   °[facility]  336-703-3100   °[facility]  336-570-6415   ° °Behavioral Health Resources in the Community: °Intensive Outpatient Programs °Organization         Address  Phone  Notes  °[facility] [address], [place], [place] 336-878-6098   °[facility] [address], [place], [place] 336-832-9800   °[facility] [address], [place], [place] ° 336-882-2125   °[facility] [address],  °[place], [name] 1-800-853-5163 or 336-641-4981   °Substance Abuse Resources °Organization         Address  Phone  Notes  °[facility]  336-882-2125   °[facility]  336-784-9470   °[facility]  336-285-9073   °[facility]  336-845-3988   °Residential & Outpatient Substance Abuse Program  1-800-659-3381   °Psychological Services °Organization         Address  Phone  Notes  °[facility]  336- 832-9600   °[facility]  336- 378-7881   °[facility] [address], [place] 1-800-853-5163 or 336-641-4981   ° °Mobile Crisis Teams °Organization         Address  Phone  Notes  °[facility], Mobile Crisis Care Unit  1-877-626-1772   °Assertive °[facility] ° [address]  [place], [place] 336-834-9664   °Sharon DeEsch [address] °[place]  336-554-5454   ° °Self-Help/Support Groups °Organization         Address  Phone             Notes  °[facility] [place] - variety of support groups  336- 373-1402 Call for more information  °Narcotics Anonymous (NA), [facility] [address], °[place]   2 meetings at this location  ° °  Residential Treatment Programs °Organization         Address  Phone  Notes  °[facility] [address],    °[place]  1-866-801-8205   °[facility] ° [address], [name], [place] 704-293-8524   °[facility] [address], [place] 336-845-3988 Admissions: 8am-3pm M-F  °[facility] [address]    °[place], [place] 336-841-1104   °[facility] [address], [place], [place] 336-379-7146   °[facility] [address]  °[place], [place] 336-285-9073   °[facility] - Intensive Outpatient [address], [place], [place] 336-852-3033   °[facility] [address]  °[place], [place] 1-877-615-2722 or 336-784-9470   °[facility] [address] [place], [place] 336-227-7417 Accepts Medicaid  °[facility] [address]  °[place] 1-800-659-3381 Substance Abuse/Addiction Treatment  ° °[place] Behavioral Health Resources °Organization         Address  Phone  Notes  °[facility]  (888) 581-9988   °Julie Brannon, PhD [address] [place], [place]   (336) 349-5553 or (336) 951-0000   °[facility]   [address] °[place], [place] (336) 349-4454   °[facility] [address], [place], [place] (336) 342-8316 Insurance/Medicaid/sponsorship through [facility]  °[facility] [address]                                    [place], [place] (336) 342-8316 Therapy/tele-psych/case    °[facility] [address]  ° [place], [place] (336) 349-2233    °Dr. Arfeen  (336) 349-4544   °[facility] [place]  [employer] [place] [facility] 1) [address],  °2) [address], [place] °3)  [address], [place] (336) 349-3220 °(336) 342-7768 ° °(336) 342-8140   °[place] Child Abuse [facility] (336) 342-1394 or (336) 342-3537 (After Hours)    ° ° °Take your usual prescriptions as previously directed.  Call your regular medical doctor on Monday to schedule a follow up appointment within the next 3 days.  Return to the Emergency Department immediately sooner if worsening.  ° °

## 2014-08-06 LAB — URINE CULTURE
Colony Count: NO GROWTH
Culture: NO GROWTH

## 2014-08-21 ENCOUNTER — Encounter: Payer: Self-pay | Admitting: Hematology & Oncology

## 2014-08-21 ENCOUNTER — Telehealth: Payer: Self-pay | Admitting: Hematology & Oncology

## 2014-08-21 ENCOUNTER — Ambulatory Visit (HOSPITAL_BASED_OUTPATIENT_CLINIC_OR_DEPARTMENT_OTHER): Payer: Medicaid Other | Admitting: Lab

## 2014-08-21 ENCOUNTER — Ambulatory Visit (HOSPITAL_BASED_OUTPATIENT_CLINIC_OR_DEPARTMENT_OTHER): Payer: Medicaid Other

## 2014-08-21 ENCOUNTER — Ambulatory Visit (HOSPITAL_BASED_OUTPATIENT_CLINIC_OR_DEPARTMENT_OTHER): Payer: Medicaid Other | Admitting: Hematology & Oncology

## 2014-08-21 VITALS — BP 134/67 | HR 92 | Temp 98.1°F | Resp 18 | Ht 66.0 in | Wt 119.0 lb

## 2014-08-21 DIAGNOSIS — C12 Malignant neoplasm of pyriform sinus: Secondary | ICD-10-CM

## 2014-08-21 LAB — CMP (CANCER CENTER ONLY)
ALT(SGPT): 19 U/L (ref 10–47)
AST: 26 U/L (ref 11–38)
Albumin: 3.2 g/dL — ABNORMAL LOW (ref 3.3–5.5)
Alkaline Phosphatase: 50 U/L (ref 26–84)
BUN: 14 mg/dL (ref 7–22)
CO2: 30 mEq/L (ref 18–33)
CREATININE: 1 mg/dL (ref 0.6–1.2)
Calcium: 9.2 mg/dL (ref 8.0–10.3)
Chloride: 99 mEq/L (ref 98–108)
Glucose, Bld: 113 mg/dL (ref 73–118)
Potassium: 3.5 mEq/L (ref 3.3–4.7)
Sodium: 138 mEq/L (ref 128–145)
Total Bilirubin: 0.4 mg/dl (ref 0.20–1.60)
Total Protein: 7.2 g/dL (ref 6.4–8.1)

## 2014-08-21 LAB — CBC WITH DIFFERENTIAL (CANCER CENTER ONLY)
BASO#: 0 10*3/uL (ref 0.0–0.2)
BASO%: 0 % (ref 0.0–2.0)
EOS%: 4.3 % (ref 0.0–7.0)
Eosinophils Absolute: 0.1 10*3/uL (ref 0.0–0.5)
HCT: 39 % (ref 38.7–49.9)
HGB: 12.7 g/dL — ABNORMAL LOW (ref 13.0–17.1)
LYMPH#: 0.8 10*3/uL — ABNORMAL LOW (ref 0.9–3.3)
LYMPH%: 25.3 % (ref 14.0–48.0)
MCH: 28 pg (ref 28.0–33.4)
MCHC: 32.6 g/dL (ref 32.0–35.9)
MCV: 86 fL (ref 82–98)
MONO#: 0.6 10*3/uL (ref 0.1–0.9)
MONO%: 18 % — ABNORMAL HIGH (ref 0.0–13.0)
NEUT%: 52.4 % (ref 40.0–80.0)
NEUTROS ABS: 1.7 10*3/uL (ref 1.5–6.5)
PLATELETS: 183 10*3/uL (ref 145–400)
RBC: 4.53 10*6/uL (ref 4.20–5.70)
RDW: 14.3 % (ref 11.1–15.7)
WBC: 3.3 10*3/uL — ABNORMAL LOW (ref 4.0–10.0)

## 2014-08-21 MED ORDER — SODIUM CHLORIDE 0.9 % IJ SOLN
10.0000 mL | INTRAMUSCULAR | Status: DC | PRN
  Administered 2014-08-21: 10 mL via INTRAVENOUS
  Filled 2014-08-21: qty 10

## 2014-08-21 MED ORDER — FENTANYL 12 MCG/HR TD PT72: MEDICATED_PATCH | TRANSDERMAL | Status: DC

## 2014-08-21 MED ORDER — HEPARIN SOD (PORK) LOCK FLUSH 100 UNIT/ML IV SOLN
500.0000 [IU] | Freq: Once | INTRAVENOUS | Status: AC
  Administered 2014-08-21: 500 [IU] via INTRAVENOUS
  Filled 2014-08-21: qty 5

## 2014-08-21 NOTE — Progress Notes (Signed)
Starke and flushed portacath before MD appt.

## 2014-08-21 NOTE — Patient Instructions (Signed)

## 2014-08-21 NOTE — Telephone Encounter (Signed)
Left pt message with feb and mar schedule

## 2014-08-21 NOTE — Addendum Note (Signed)
Addended by: Burney Gauze R on: 08/21/2014 11:19 AM   Modules accepted: Orders

## 2014-08-21 NOTE — Progress Notes (Signed)
Hematology and Oncology Follow Up Visit  HAWTHORNE DAY [id number] [birth year] 63 y.o. 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  Collin Young is back for followup. His swallowing is better.   He is family rub in [place]. They're rubbed for part of the fall. That a great time. He did well. He really did not get fatigued. He did not get into any problem with pain. He seemed to be eating well when he was up there according to his wife.  He had an MRI done of his neck. This was done on November 16. This did not show any evidence of recurrent disease.  He's had no problems with fever. He's had no change in bowel or bladder habits.  There's been no nausea or vomiting.   He's had no cough. He's had no nausea or vomiting. He's had no problems with bowels or bladder. He currently is on a Duragesic patch. He's on 12 mcg patch. We will continue him on this.   Overall, his performance status is ECOG 1  Medications: Current outpatient prescriptions: fentaNYL (DURAGESIC - DOSED MCG/HR) 12 MCG/HR, Place 1 patch every 3 days to the skin, Disp: 10 patch, Rfl: 0;  morphine (ROXANOL) 20 MG/ML concentrated solution, Take 0.5 mLs (10 mg total) by mouth every 3 (three) hours as needed for severe pain., Disp: 60 mL, Rfl: 0;  Multiple Vitamin (MULTIVITAMIN WITH MINERALS) TABS tablet, Take 1 tablet by mouth daily., Disp: , Rfl:  Sennosides (EX-LAX PO), Take 1 tablet by mouth daily as needed (for constipation). , Disp: , Rfl:  No current facility-administered medications for this visit. Facility-Administered Medications Ordered in Other Visits: sodium chloride 0.9 % injection 10 mL, 10 mL, Intravenous, PRN, [name], MD, 10 mL at 08/21/14 1016;  topical emolient (BIAFINE) emulsion, , Topical, Daily, Jodelle Gross,  MD  Allergies: No Known Allergies  Past Medical History, Surgical history, Social history, and Family History were reviewed and updated.  Review of Systems: As above  Physical Exam:  height is 5\' 6"  (1.676 m) and weight is 119 lb (53.978 kg). His oral temperature is 98.1 F (36.7 C). His blood pressure is 134/67 and his pulse is 92. His respiration is 18.   Thin, African American gentleman. Head and neck exam shows no ocular or oral lesions. There is no adenopathy in the neck.  thyroid is nonpalpable. Lungs are clear. Cardiac exam regular rate and  rhythm There is no murmurs, rubs or bruits.. Abdomen  is soft. Has good bowel sounds. The feeding tube site  is intact.  it is healing nicely. There is no erythema or swelling about the feeding tube.Back exam shows no tenderness over the spine, ribs or hips. Extremities shows no clubbing, cyanosis or edema. Has good strength in his legs. Skin exam shows  no rashes. Neurological exam is nonfocal.  Lab Results  Component Value Date   WBC 3.3* 08/21/2014   HGB 12.7* 08/21/2014   HCT 39.0 08/21/2014   MCV 86 08/21/2014   PLT 183 08/21/2014     Chemistry      Component Value Date/Time   NA 138 08/21/2014 1009   NA 133* 08/04/2014 2027   K 3.5 08/21/2014 1009   K 4.3 08/04/2014 2027   CL 99 08/21/2014 1009   CL  91* 08/04/2014 2027   CO2 30 08/21/2014 1009   CO2 29 08/04/2014 2027   BUN 14 08/21/2014 1009   BUN 15 08/04/2014 2027   CREATININE 1.0 08/21/2014 1009   CREATININE 0.82 08/04/2014 2027      Component Value Date/Time   CALCIUM 9.2 08/21/2014 1009   CALCIUM 10.0 08/04/2014 2027   ALKPHOS 50 08/21/2014 1009   ALKPHOS 49 01/28/2014 1225   AST 26 08/21/2014 1009   AST 20 01/28/2014 1225   ALT 19 08/21/2014 1009   ALT 13 01/28/2014 1225   BILITOT 0.40 08/21/2014 1009   BILITOT 0.3 01/28/2014 1225         Impression and Plan: Mr. [name] is 63 year old gentleman. He has a localized recurrence of carcinoma of the left  pyriform sinus. He underwent concurrent chemotherapy and radiation therapy. He finished his treatment back in April.   I will refill his Duragesic patch. This is helping him. He is on Roxanol elixir which she does not take much of.  For now, I don't think we have to do any scans on him unless he has symptoms. His last MRI looked quite good. Unless he has issues with swallowing, either we can hold off on further scans.  I'll plan to see him back in 3 months.  He does have a Port-A-Cath in. We will keep this in for right now. [name], MD 12/22/201511:06 AM

## 2014-08-27 ENCOUNTER — Ambulatory Visit: Payer: Self-pay

## 2014-08-27 ENCOUNTER — Ambulatory Visit: Payer: Medicaid Other

## 2014-09-03 ENCOUNTER — Ambulatory Visit (HOSPITAL_COMMUNITY): Payer: Medicaid - Dental | Admitting: Dentistry

## 2014-09-03 ENCOUNTER — Encounter (HOSPITAL_COMMUNITY): Payer: Self-pay | Admitting: Dentistry

## 2014-09-03 VITALS — BP 145/65 | HR 85 | Temp 98.1°F

## 2014-09-03 DIAGNOSIS — K0820 Unspecified atrophy of edentulous alveolar ridge: Secondary | ICD-10-CM

## 2014-09-03 DIAGNOSIS — Z972 Presence of dental prosthetic device (complete) (partial): Secondary | ICD-10-CM

## 2014-09-03 DIAGNOSIS — K08109 Complete loss of teeth, unspecified cause, unspecified class: Secondary | ICD-10-CM

## 2014-09-03 DIAGNOSIS — K000 Anodontia: Secondary | ICD-10-CM

## 2014-09-03 DIAGNOSIS — R682 Dry mouth, unspecified: Secondary | ICD-10-CM

## 2014-09-03 DIAGNOSIS — Z463 Encounter for fitting and adjustment of dental prosthetic device: Secondary | ICD-10-CM

## 2014-09-03 DIAGNOSIS — Z923 Personal history of irradiation: Secondary | ICD-10-CM

## 2014-09-03 DIAGNOSIS — Z9221 Personal history of antineoplastic chemotherapy: Secondary | ICD-10-CM

## 2014-09-03 DIAGNOSIS — K117 Disturbances of salivary secretion: Secondary | ICD-10-CM

## 2014-09-03 NOTE — Progress Notes (Signed)
09/03/2014  Patient:            Collin Young Date of Birth:  12-Feb-1951 MRN:                [id number]  BP 145/65 mmHg  Pulse 85  Temp(Src) 98.1 F (36.7 C) (Oral)  DORSEL FLINN presents for evaluation of recently inserted upper and lower complete dentures.   SUBJECTIVE: Patient is not complaining of any denture irritation.  OBJECTIVE: There is no sign of denture irritation or erythema. Procedure: Pressure indicating paste was applied to the dentures. Adjustments were made as needed. [place] Occlusion evaluated and adjustments made as needed for Centric Relation and protrusive strokes. Patient still with tendency to protrude to end to end position. Patient was again instructed in finding the maximum intercuspation position. Patient accepts results. Patient to keep dentures out if sore spots develop. Use salt water rinses as needed to aid healing. Return to clinic as scheduled for denture adjustment.   Call if problems arise before then. Patient is aware of the potential for problems secondary to his xerostomia and previous radiation therapy. Patient dismissed in stable condition.  Lenn Cal, DDS

## 2014-09-03 NOTE — Patient Instructions (Signed)
Patient to keep dentures out if sore spots develop. Use salt water rinses as needed to aid healing. Return to clinic as scheduled for denture adjustment.   Call if problems arise before then. Patient is aware of the potential for problems secondary to his xerostomia and previous radiation therapy.   Lenn Cal, DDS

## 2014-09-27 ENCOUNTER — Ambulatory Visit: Payer: Medicaid Other | Attending: Radiation Oncology

## 2014-09-27 DIAGNOSIS — R1313 Dysphagia, pharyngeal phase: Secondary | ICD-10-CM | POA: Diagnosis not present

## 2014-09-27 NOTE — Therapy (Signed)
[facility] [address] [place], [place], [address] Phone: [phone]   Fax:  [phone]  Speech Language Pathology Treatment  Patient Details  Name: Collin Young MRN: [id number] Date of Birth: 14-Feb-1951 Referring Provider:  [name], MD  Encounter Date: 09/27/2014      End of Session - 09/27/14 1208    Visit Number 3   Number of Visits 4   Authorization Type medicaid   Authorization Time Period until 10-18-14   Authorization - Visit Number 3   Authorization - Number of Visits 4   SLP Start Time 1062   SLP Stop Time  1058   SLP Time Calculation (min) 40 min   Activity Tolerance Patient tolerated treatment well      Past Medical History  Diagnosis Date  . Hypertension   . Hyperlipemia   . HOH (hard of hearing)   . Seizures     alcohol withdrawls- 2001  . GERD (gastroesophageal reflux disease)     takes zantac prn  . Cancer     Esophagus- radiation 2011  . History of radiation therapy 10/19/2013-12/05/2013    pyriform sinus 69.96 Gy/49fx  . Headache     Past Surgical History  Procedure Laterality Date  . No past surgeries    . Cardiovascular stress test  10/09/09    normal nuclearr stress test, EF 57% [name])  . Laryngoscopy N/A 09/15/2013    Procedure: LARYNGOSCOPY;  Surgeon: [name], MD;  Location: [facility] OR;  Service: ENT;  Laterality: N/A;  direct laryngoscopy with biopsy and esophagoscopy    There were no vitals taken for this visit.  Visit Diagnosis: Pharyngeal dysphagia      Subjective Assessment - 09/27/14 1105    Symptoms Radiation completed April 2015. SLP saw pt in August - told pt to complete HEP BID until Nov 2015 then x2/week after that. He has not done this.             ADULT SLP TREATMENT - 09/27/14 1106    General Information   Behavior/Cognition Alert;Cooperative;Pleasant mood   Treatment Provided   Treatment provided Dysphagia   Dysphagia Treatment   Treatment Methods Skilled observation;Therapeutic exercise;Compensation strategy training;Patient/caregiver education   Patient observed directly with PO's Yes   Type of PO's observed Dysphagia 3 (soft);Thin liquids   Liquids provided via Cup   Pharyngeal Phase Signs & Symptoms Immediate throat clear;Immediate cough   Type of cueing Verbal   Amount of cueing Moderate  initially, faded to standby A   Other treatment/comments Pt told SLP he had not completed HEP. PO trials resulted in consistent cough/throat clear with liquids. SLP worked with pt and found through skilled observation that small sip, chin tuck, and effortful swallow x2 resulted in absence of throat clear/cough. Pt used compensations 95% of the time the rest of the session. SLP educated pt on HEP procedure (initially requiring mod A usually) and pt independent by end of session. SLP also educated pt on signs/symptoms aspiration PNA for approx 9 minutes. By the end of that time pt able to tell SLP 3 s/s aspiration PNA.   Pain Assessment   Pain Assessment No/denies pain   Assessment / Recommendations / Plan   Plan Continue with current plan of care   Dysphagia Recommendations   Diet recommendations Dysphagia 3 (mechanical soft);Thin liquid   Compensations Effortful swallow;Multiple dry swallows after each bite/sip  with liquids   Postural Changes and/or Swallow Maneuvers Chin tuck  Progression Toward Goals   Progression toward goals Progressing toward goals          SLP Education - 09/27/14 1207    Education provided Yes   Education Details HEP, s/s aspiration PNA, swallowing precautions   Person(s) Educated Patient   Methods Explanation;Handout   Comprehension Verbalized understanding;Returned demonstration            SLP Long Term Goals - 09/27/14 1214    SLP LONG TERM GOAL #1   Title pt will demo HEP with modified independence   Baseline max A   Time 1   Period Weeks   Status New   SLP LONG TERM GOAL #2    Title pt will tell SLP two possible foods to eat while healing from head/neck rad tx   Baseline none yet   Time 1   Period Weeks   Status New   SLP LONG TERM GOAL #3   Title pt will tell SLP 3 signs of aspiration PNA with modified independence   Baseline none    Time 1   Period Weeks   Status Achieved          Plan - 09/27/14 1209    Clinical Impression Statement pt presented today with min pharyngeal dysphagia due to rad tx ending in April 2015. Pt req'd swallow precautions as well as education re: HEP prodedure and s/s aspiration PNA   Speech Therapy Frequency 1x /week   Duration 1 week  remaining prior to 10-18-14   Treatment/Interventions Aspiration precaution training;Pharyngeal strengthening exercises;Oral motor exercises;Compensatory strategies;SLP instruction and feedback;Patient/family education;Diet toleration management by SLP;Cueing hierarchy   Potential to Achieve Goals Good        Problem List Patient Active Problem List   Diagnosis Date Noted  . Malignant neoplasm of pyriform sinus 09/28/2013  . 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 NONSPECIFIC ABN FINDING RAD & OTH EXAM GI TRACT 05/14/2009    Garald Balding, Slp 09/27/2014, 12:14 PM  [facility] [address] [place], [place], [address] Phone: [phone]   Fax:  [phone]

## 2014-09-27 NOTE — Patient Instructions (Addendum)
SWALLOWING EXERCISES Do these twice every day until 11-30-14, then twice a week after that   1. Swallow hard 15 times (you can take sips)    2. Bite your tongue and swallow 15 times- use a drop of water    3. Swallow and keep Adam Apple up for 5 seconds, 15 times- use a drop of water     4. Press your tongue on the roof of your mouth 20 times    5. Open mouth and push your fist against your chin for 5 seconds - 10 times    6. Shout "HUH!" and hold breath for 5 seconds - 20 times    ========================================================================================================  Signs of Aspiration Pneumonia   . Chest pain/tightness . Fever (can be low grade) . Cough  o With foul-smelling phlegm (sputum) o With sputum containing pus or blood o With greenish sputum . Fatigue  . Shortness of breath  . Wheezing   **IF YOU HAVE THESE SIGNS, CONTACT YOUR DOCTOR OR GO TO THE EMERGENCY DEPARTMENT OR URGENT CARE AS SOON AS POSSIBLE**

## 2014-10-03 ENCOUNTER — Ambulatory Visit (HOSPITAL_BASED_OUTPATIENT_CLINIC_OR_DEPARTMENT_OTHER): Payer: Medicaid Other

## 2014-10-03 VITALS — BP 120/66 | HR 89 | Temp 98.7°F | Resp 18 | Wt 124.0 lb

## 2014-10-03 DIAGNOSIS — Z452 Encounter for adjustment and management of vascular access device: Secondary | ICD-10-CM

## 2014-10-03 DIAGNOSIS — C12 Malignant neoplasm of pyriform sinus: Secondary | ICD-10-CM

## 2014-10-03 MED ORDER — HEPARIN SOD (PORK) LOCK FLUSH 100 UNIT/ML IV SOLN
500.0000 [IU] | Freq: Once | INTRAVENOUS | Status: AC
  Administered 2014-10-03: 500 [IU] via INTRAVENOUS
  Filled 2014-10-03: qty 5

## 2014-10-03 MED ORDER — SODIUM CHLORIDE 0.9 % IJ SOLN
10.0000 mL | INTRAMUSCULAR | Status: DC | PRN
  Administered 2014-10-03: 10 mL via INTRAVENOUS
  Filled 2014-10-03: qty 10

## 2014-10-03 NOTE — Patient Instructions (Signed)

## 2014-10-09 ENCOUNTER — Other Ambulatory Visit: Payer: Self-pay | Admitting: *Deleted

## 2014-10-09 ENCOUNTER — Ambulatory Visit: Payer: Medicaid Other | Attending: Radiation Oncology

## 2014-10-09 DIAGNOSIS — R1313 Dysphagia, pharyngeal phase: Secondary | ICD-10-CM | POA: Insufficient documentation

## 2014-10-09 DIAGNOSIS — C12 Malignant neoplasm of pyriform sinus: Secondary | ICD-10-CM

## 2014-10-09 MED ORDER — FENTANYL 12 MCG/HR TD PT72: MEDICATED_PATCH | TRANSDERMAL | Status: DC

## 2014-10-16 ENCOUNTER — Telehealth: Payer: Self-pay | Admitting: Hematology & Oncology

## 2014-10-16 NOTE — Telephone Encounter (Signed)
Patient: Collin Lazalde., DOB: 1951-06-30  Drug: FentaNYL 12MCG/HR 72 hr patches  PA created date: 10/10/2014  Outcome: Approved

## 2014-10-22 ENCOUNTER — Ambulatory Visit: Payer: Self-pay

## 2014-11-13 ENCOUNTER — Encounter: Payer: Self-pay | Admitting: Hematology & Oncology

## 2014-11-13 ENCOUNTER — Other Ambulatory Visit (HOSPITAL_BASED_OUTPATIENT_CLINIC_OR_DEPARTMENT_OTHER): Payer: Medicaid Other | Admitting: Lab

## 2014-11-13 ENCOUNTER — Ambulatory Visit (HOSPITAL_BASED_OUTPATIENT_CLINIC_OR_DEPARTMENT_OTHER): Payer: Medicaid Other | Admitting: Hematology & Oncology

## 2014-11-13 ENCOUNTER — Ambulatory Visit: Payer: Medicaid Other

## 2014-11-13 VITALS — BP 148/72 | HR 84 | Temp 97.9°F | Resp 18 | Ht 66.0 in | Wt 125.0 lb

## 2014-11-13 DIAGNOSIS — C12 Malignant neoplasm of pyriform sinus: Secondary | ICD-10-CM

## 2014-11-13 LAB — CBC WITH DIFFERENTIAL (CANCER CENTER ONLY)
BASO#: 0 10*3/uL (ref 0.0–0.2)
BASO%: 0.3 % (ref 0.0–2.0)
EOS ABS: 0.1 10*3/uL (ref 0.0–0.5)
EOS%: 3.8 % (ref 0.0–7.0)
HCT: 41.3 % (ref 38.7–49.9)
HGB: 13.7 g/dL (ref 13.0–17.1)
LYMPH#: 0.9 10*3/uL (ref 0.9–3.3)
LYMPH%: 30.1 % (ref 14.0–48.0)
MCH: 28.8 pg (ref 28.0–33.4)
MCHC: 33.2 g/dL (ref 32.0–35.9)
MCV: 87 fL (ref 82–98)
MONO#: 0.6 10*3/uL (ref 0.1–0.9)
MONO%: 19.9 % — ABNORMAL HIGH (ref 0.0–13.0)
NEUT%: 45.9 % (ref 40.0–80.0)
NEUTROS ABS: 1.3 10*3/uL — AB (ref 1.5–6.5)
Platelets: 188 10*3/uL (ref 145–400)
RBC: 4.75 10*6/uL (ref 4.20–5.70)
RDW: 13.5 % (ref 11.1–15.7)
WBC: 2.9 10*3/uL — AB (ref 4.0–10.0)

## 2014-11-13 LAB — TESTOSTERONE: Testosterone: 682 ng/dL (ref 300–890)

## 2014-11-13 LAB — CMP (CANCER CENTER ONLY)
ALK PHOS: 43 U/L (ref 26–84)
ALT(SGPT): 27 U/L (ref 10–47)
AST: 32 U/L (ref 11–38)
Albumin: 3.5 g/dL (ref 3.3–5.5)
BUN, Bld: 15 mg/dL (ref 7–22)
CO2: 31 mEq/L (ref 18–33)
CREATININE: 1 mg/dL (ref 0.6–1.2)
Calcium: 9 mg/dL (ref 8.0–10.3)
Chloride: 102 mEq/L (ref 98–108)
Glucose, Bld: 121 mg/dL — ABNORMAL HIGH (ref 73–118)
Potassium: 3.9 mEq/L (ref 3.3–4.7)
Sodium: 140 mEq/L (ref 128–145)
Total Bilirubin: 0.6 mg/dl (ref 0.20–1.60)
Total Protein: 7.3 g/dL (ref 6.4–8.1)

## 2014-11-13 LAB — TSH CHCC: TSH: 1.793 m(IU)/L (ref 0.320–4.118)

## 2014-11-13 NOTE — Progress Notes (Signed)
Hematology and Oncology Follow Up Visit  Collin Young [id number] 12/06/1950 64 y.o. 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  Young is back for followup. He is eating pretty well. He's eating and swallowing okay. He's would not have any dysphagia or odynophagia.  There is no change in bowel or bladder habits. He's had no pain. He is on a Duragesic patch at 12 g. This seems to be helping him quite a bit.  He's had no bleeding. He's had no headache. He's had no leg swelling.   He is given ready for springtime. He'll be doing a lot of things outside.  Overall, his performance status is ECOG 1  Medications:  Current outpatient prescriptions:  .  fentaNYL (DURAGESIC - DOSED MCG/HR) 12 MCG/HR, Place 1 patch every 3 days to the skin, Disp: 10 patch, Rfl: 0 .  Multiple Vitamin (MULTIVITAMIN WITH MINERALS) TABS tablet, Take 1 tablet by mouth daily., Disp: , Rfl:  .  Sennosides (EX-LAX PO), Take 1 tablet by mouth daily as needed (for constipation). , Disp: , Rfl:  .  morphine (ROXANOL) 20 MG/ML concentrated solution, Take 0.5 mLs (10 mg total) by mouth every 3 (three) hours as needed for severe pain. (Patient not taking: Reported on 11/13/2014), Disp: 60 mL, Rfl: 0 No current facility-administered medications for this visit.  Facility-Administered Medications Ordered in Other Visits:  .  topical emolient (BIAFINE) emulsion, , Topical, Daily, Kyung Rudd, MD  Allergies: No Known Allergies  Past Medical History, Surgical history, Social history, and Family History were reviewed and updated.  Review of Systems: As above  Physical Exam:  height is 5\' 6"  (1.676 m) and weight is 125 lb (56.7 kg). His oral temperature is 97.9 F (36.6 C). His blood pressure is 148/72 and his pulse is 84. His  respiration is 18.   Thin, African American gentleman. Head and neck exam shows no ocular or oral lesions. There is no adenopathy in the neck.  thyroid is nonpalpable. Lungs are clear. Cardiac exam regular rate and  Rhythm. There is no murmurs, rubs or bruits.. Abdomen  is soft. He has good bowel sounds. The feeding tube site  is intact.  it is healing nicely. There is no erythema or swelling about the feeding tube.Back exam shows no tenderness over the spine, ribs or hips. Extremities shows no clubbing, cyanosis or edema. He has good strength in his legs. Skin exam shows  no rashes, ecchymoses or petechia. Neurological exam is nonfocal.  Lab Results  Component Value Date   WBC 2.9* 11/13/2014   HGB 13.7 11/13/2014   HCT 41.3 11/13/2014   MCV 87 11/13/2014   PLT 188 11/13/2014     Chemistry      Component Value Date/Time   NA 138 08/21/2014 1009   NA 133* 08/04/2014 2027   K 3.5 08/21/2014 1009   K 4.3 08/04/2014 2027   CL 99 08/21/2014 1009   CL 91* 08/04/2014 2027   CO2 30 08/21/2014 1009   CO2 29 08/04/2014 2027   BUN 14 08/21/2014 1009   BUN 15 08/04/2014 2027   CREATININE 1.0 08/21/2014 1009   CREATININE 0.82 08/04/2014 2027      Component Value Date/Time   CALCIUM 9.2 08/21/2014 1009   CALCIUM 10.0 08/04/2014 2027   ALKPHOS 50  08/21/2014 1009   ALKPHOS 49 01/28/2014 1225   AST 26 08/21/2014 1009   AST 20 01/28/2014 1225   ALT 19 08/21/2014 1009   ALT 13 01/28/2014 1225   BILITOT 0.40 08/21/2014 1009   BILITOT 0.3 01/28/2014 1225         Impression and Plan: Collin Young is 64 year old gentleman. He has a localized recurrence of carcinoma of the left pyriform sinus. He underwent concurrent chemotherapy and radiation therapy. He finished his treatment back in April of 2015.   I will refill his Duragesic patch. This is helping him. He is on Roxanol elixir which he does not take much of.  For now, I don't think we have to do any scans on him unless he has symptoms.  His last MRI looked quite good. Unless he has issues with swallowing, either we can hold off on further scans.  I'll plan to see him back in 3 months.  He does have a Port-A-Cath in. We will keep this in for right now. [name], MD 3/15/201611:22 AM

## 2014-11-14 MED ORDER — HEPARIN SOD (PORK) LOCK FLUSH 100 UNIT/ML IV SOLN
500.0000 [IU] | Freq: Once | INTRAVENOUS | Status: DC
  Administered 2014-11-14: 500 [IU] via INTRAVENOUS
  Filled 2014-11-14: qty 5

## 2014-11-14 MED ORDER — SODIUM CHLORIDE 0.9 % IJ SOLN
10.0000 mL | INTRAMUSCULAR | Status: DC | PRN
  Filled 2014-11-14: qty 10

## 2014-11-14 MED ORDER — SODIUM CHLORIDE 0.9 % IJ SOLN
10.0000 mL | INTRAMUSCULAR | Status: DC | PRN
  Administered 2014-11-14: 10 mL via INTRAVENOUS
  Filled 2014-11-14: qty 10

## 2014-11-14 MED ORDER — HEPARIN SOD (PORK) LOCK FLUSH 100 UNIT/ML IV SOLN
500.0000 [IU] | Freq: Once | INTRAVENOUS | Status: DC
  Filled 2014-11-14: qty 5

## 2014-11-15 ENCOUNTER — Other Ambulatory Visit: Payer: Self-pay | Admitting: Family

## 2014-11-28 ENCOUNTER — Other Ambulatory Visit: Payer: Self-pay | Admitting: Nurse Practitioner

## 2014-11-28 DIAGNOSIS — C12 Malignant neoplasm of pyriform sinus: Secondary | ICD-10-CM

## 2014-11-28 MED ORDER — FENTANYL 12 MCG/HR TD PT72: MEDICATED_PATCH | TRANSDERMAL | Status: DC

## 2014-12-06 ENCOUNTER — Ambulatory Visit
Admission: RE | Admit: 2014-12-06 | Discharge: 2014-12-06 | Disposition: A | Discharge status: Home / Self Care | Payer: Medicaid Other | Source: Ambulatory Visit | Attending: Radiation Oncology | Admitting: Radiation Oncology

## 2014-12-06 ENCOUNTER — Encounter: Payer: Self-pay | Admitting: Radiation Oncology

## 2014-12-06 VITALS — BP 155/93 | HR 78 | Temp 97.9°F | Resp 20 | Ht 66.0 in | Wt 127.4 lb

## 2014-12-06 DIAGNOSIS — C12 Malignant neoplasm of pyriform sinus: Secondary | ICD-10-CM

## 2014-12-06 DIAGNOSIS — D490 Neoplasm of unspecified behavior of digestive system: Secondary | ICD-10-CM

## 2014-12-06 DIAGNOSIS — Z87891 Personal history of nicotine dependence: Secondary | ICD-10-CM | POA: Insufficient documentation

## 2014-12-06 MED ORDER — LARYNGOSCOPY SOLUTION RAD-ONC
15.0000 mL | Freq: Once | TOPICAL | Status: AC
  Administered 2014-12-06: 15 mL via TOPICAL
  Filled 2014-12-06: qty 15

## 2014-12-06 NOTE — Progress Notes (Signed)
Patient scoped laryngoscope by MD,patient tolerated well 4:19 PM

## 2014-12-06 NOTE — Progress Notes (Signed)
Radiation Oncology         [phone] ________________________________  Name: LADARREN STEINER MRN: [id number]  Date: 12/06/2014  DOB: [birth year]  Follow-Up Visit Note  CC: Myriam Jacobson, MD  [name], MD  Diagnosis:   Squamous cell carcinoma of the pyriform sinus  Interval Since Last Radiation:  The patient completed radiation treatment on 12/05/2013   Narrative:  The patient returns today for routine follow-up.  He states that he is doing well overall. He is not having any pain with swallowing. No dysphagia or odynophagia. His energy level has been fairly good. No difficulties with shortness of breath. He did see medical oncology recently as well.                         ALLERGIES:  has No Known Allergies.  Meds: Current Outpatient Prescriptions  Medication Sig Dispense Refill  . fentaNYL (DURAGESIC - DOSED MCG/HR) 12 MCG/HR Place 1 patch every 3 days to the skin 10 patch 0  . Multiple Vitamin (MULTIVITAMIN WITH MINERALS) TABS tablet Take 1 tablet by mouth daily.    . Sennosides (EX-LAX PO) Take 1 tablet by mouth daily as needed (for constipation).     . morphine (ROXANOL) 20 MG/ML concentrated solution Take 0.5 mLs (10 mg total) by mouth every 3 (three) hours as needed for severe pain. (Patient not taking: Reported on 11/13/2014) 60 mL 0   Current Facility-Administered Medications  Medication Dose Route Frequency Provider Last Rate Last Dose  . laryngocopy solution for Rad-Onc  15 mL Topical Once Kyung Rudd, MD       Facility-Administered Medications Ordered in Other Encounters  Medication Dose Route Frequency Provider Last Rate Last Dose  . topical emolient (BIAFINE) emulsion   Topical Daily Kyung Rudd, MD        Physical Findings: The patient is in no acute distress. Patient is alert and oriented.  height is 5\' 6"  (1.676 m) and weight is 127 lb 6.4 oz (57.788 kg). His oral temperature is 97.9 F (36.6 C). His blood pressure is 155/93 and his pulse is 78.  His respiration is 20 and oxygen saturation is 100%. .   Oral cavity clear No cervical lymphadenopathy,   Fiberoptic exam: After the use of topical anesthetic, the flexible laryngoscope was passed through the right near. Good visualization was obtained. No lesions or suspicious findings within the larynx, hypopharynx, oropharynx or nasopharynx.   Lab Findings: Lab Results  Component Value Date   WBC 2.9* 11/13/2014   HGB 13.7 11/13/2014   HCT 41.3 11/13/2014   MCV 87 11/13/2014   PLT 188 11/13/2014     Radiographic Findings: Ir Gastrostomy Tube Removal  06/05/2014   CLINICAL DATA:  Patient with history of head and neck cancer at the piriform sinus and prior 20 French pull-through gastrostomy tube placement on 10/03/2013. The patient is currently eating and request is now made for gastrostomy tube removal.  EXAM: IR TUBE REMOVAL GASTROSTOMY  MEDICATIONS: Viscous lidocaine into the gastrostomy tube insertion site tract  CONTRAST:  None  FLUOROSCOPY TIME:  None  PROCEDURE: Following informed consent the patient was placed supine on a stretcher. Viscous lidocaine was applied into the gastrostomy tube insertion site tract. The gastrostomy tube was removed in its entirety with manual traction. A gauze dressing was applied over the site.  Complications: None  FINDINGS: Clean, intact gastrostomy tube site with small amount of granulation tissue.  IMPRESSION: Successful removal of 20  French pull-through gastrostomy tube.  Read by: Rowe Robert, PA-C   Electronically Signed   By: Arne Cleveland M.D.   On: 06/05/2014 11:23    Impression:    The patient clinically is doing well. No suspicious findings or new symptoms on exam today.  Plan:  Followup in 6 months once again.  I spent 15 minutes with the patient today, the majority of which was spent counseling the patient on the diagnosis of cancer and coordinating care.   Jodelle Gross, M.D., Ph.D.

## 2014-12-06 NOTE — Progress Notes (Signed)
Follow up s/p radiation tx 10/19/13-12/05/13 left piriform sinus, has gained weight ,eating solids foods with difficulty swallowing, drinks water only stated, no nausea or pain last TSH 11/13/14= 1.793, on 72mcg duragesic patch, last seen Dr. Martha Clan same 11/13/14 , energy level good, no c/o 3:59 PM

## 2014-12-25 ENCOUNTER — Ambulatory Visit (HOSPITAL_BASED_OUTPATIENT_CLINIC_OR_DEPARTMENT_OTHER): Payer: Medicaid Other

## 2014-12-25 VITALS — BP 146/78 | HR 76 | Temp 97.4°F | Resp 18 | Wt 126.0 lb

## 2014-12-25 DIAGNOSIS — C12 Malignant neoplasm of pyriform sinus: Secondary | ICD-10-CM

## 2014-12-25 DIAGNOSIS — Z452 Encounter for adjustment and management of vascular access device: Secondary | ICD-10-CM | POA: Diagnosis not present

## 2014-12-25 MED ORDER — SODIUM CHLORIDE 0.9 % IJ SOLN
10.0000 mL | INTRAMUSCULAR | Status: DC | PRN
  Administered 2014-12-25: 10 mL via INTRAVENOUS
  Filled 2014-12-25: qty 10

## 2014-12-25 MED ORDER — HEPARIN SOD (PORK) LOCK FLUSH 100 UNIT/ML IV SOLN
500.0000 [IU] | Freq: Once | INTRAVENOUS | Status: AC
  Administered 2014-12-25: 500 [IU] via INTRAVENOUS
  Filled 2014-12-25: qty 5

## 2014-12-25 NOTE — Patient Instructions (Signed)

## 2015-01-04 ENCOUNTER — Other Ambulatory Visit: Payer: Self-pay | Admitting: Nurse Practitioner

## 2015-01-04 ENCOUNTER — Other Ambulatory Visit: Payer: Self-pay | Admitting: *Deleted

## 2015-01-04 DIAGNOSIS — C12 Malignant neoplasm of pyriform sinus: Secondary | ICD-10-CM

## 2015-01-04 MED ORDER — FENTANYL 12 MCG/HR TD PT72: MEDICATED_PATCH | TRANSDERMAL | Status: DC

## 2015-01-07 ENCOUNTER — Telehealth: Payer: Self-pay | Admitting: Hematology & Oncology

## 2015-01-07 ENCOUNTER — Encounter: Payer: Self-pay | Admitting: Nurse Practitioner

## 2015-01-07 NOTE — Progress Notes (Signed)
PA for fentanyl patch sent to [facility] [phone]. Information completed and confirmation received. Pt is authorized for the Fentanyl patch 72mcg/ 10 patches per month from 01-04-15 to 01-04-16. [facility] pharmacy is aware.

## 2015-01-07 NOTE — Telephone Encounter (Signed)
Patient: Collin Young., DOB: [birth year]  Drug: FentaNYL 12MCG/HR 72 hr patches  PA created date: 01/04/2015  Outcome: Approved

## 2015-02-08 ENCOUNTER — Encounter (HOSPITAL_COMMUNITY): Payer: Self-pay | Admitting: Emergency Medicine

## 2015-02-08 ENCOUNTER — Telehealth: Payer: Self-pay | Admitting: Hematology & Oncology

## 2015-02-08 ENCOUNTER — Other Ambulatory Visit: Payer: Self-pay | Admitting: *Deleted

## 2015-02-08 ENCOUNTER — Emergency Department (HOSPITAL_COMMUNITY)
Admission: EM | Admit: 2015-02-08 | Discharge: 2015-02-08 | Disposition: A | Discharge status: Home / Self Care | Payer: Medicaid Other | Attending: Emergency Medicine | Admitting: Emergency Medicine

## 2015-02-08 DIAGNOSIS — I10 Essential (primary) hypertension: Secondary | ICD-10-CM | POA: Insufficient documentation

## 2015-02-08 DIAGNOSIS — L293 Anogenital pruritus, unspecified: Secondary | ICD-10-CM

## 2015-02-08 DIAGNOSIS — Z87891 Personal history of nicotine dependence: Secondary | ICD-10-CM | POA: Insufficient documentation

## 2015-02-08 DIAGNOSIS — L290 Pruritus ani: Secondary | ICD-10-CM | POA: Diagnosis not present

## 2015-02-08 DIAGNOSIS — C12 Malignant neoplasm of pyriform sinus: Secondary | ICD-10-CM

## 2015-02-08 DIAGNOSIS — Z8501 Personal history of malignant neoplasm of esophagus: Secondary | ICD-10-CM | POA: Diagnosis not present

## 2015-02-08 DIAGNOSIS — K219 Gastro-esophageal reflux disease without esophagitis: Secondary | ICD-10-CM | POA: Diagnosis not present

## 2015-02-08 DIAGNOSIS — L299 Pruritus, unspecified: Secondary | ICD-10-CM | POA: Diagnosis present

## 2015-02-08 DIAGNOSIS — Z79899 Other long term (current) drug therapy: Secondary | ICD-10-CM | POA: Insufficient documentation

## 2015-02-08 DIAGNOSIS — Z8639 Personal history of other endocrine, nutritional and metabolic disease: Secondary | ICD-10-CM | POA: Insufficient documentation

## 2015-02-08 MED ORDER — FENTANYL 12 MCG/HR TD PT72: MEDICATED_PATCH | TRANSDERMAL | Status: DC

## 2015-02-08 NOTE — Telephone Encounter (Signed)
Patient called and cx 02/12/15 and resch for 02/15/15

## 2015-02-08 NOTE — ED Notes (Signed)
Pt reports insect bite to right thigh a week ago. Pt reports itching but denies redness, itching, or swelling.

## 2015-02-08 NOTE — Discharge Instructions (Signed)
Pruritus  Pruritus is an itch. There are many different problems that can cause an itch. Dry skin is one of the most common causes of itching. Most cases of itching do not require medical attention.  HOME CARE INSTRUCTIONS  Make sure your skin is moistened on a regular basis. A moisturizer that contains petroleum jelly is best for keeping moisture in your skin. If you develop a rash, you may try the following for relief:   Use corticosteroid cream.  Apply cool compresses to the affected areas.  Bathe with Epsom salts or baking soda in the bathwater.  Soak in colloidal oatmeal baths. These are available at your pharmacy.  Apply baking soda paste to the rash. Stir water into baking soda until it reaches a paste-like consistency.  Use an anti-itch lotion.  Take over-the-counter diphenhydramine medicine by mouth as the instructions direct.  Avoid scratching. Scratching may cause the rash to become infected. If itching is very bad, your caregiver may suggest prescription lotions or creams to lessen your symptoms.  Avoid hot showers, which can make itching worse. A cold shower may help with itching as long as you use a moisturizer after the shower. SEEK MEDICAL CARE IF: The itching does not go away after several days. Document Released: 04/29/2011 Document Revised: 01/01/2014 Document Reviewed: 04/29/2011 [facility] Patient Information 2015 [place], [place]. This information is not intended to replace advice given to you by your health care provider. Make sure you discuss any questions you have with your health care provider. Try rinsing your underwear in clear water through 2 wash cycles to get rid of any residual soap etc.

## 2015-02-08 NOTE — ED Provider Notes (Signed)
CSN: [id number]     Arrival date & time 02/08/15  1841 History  This chart was scribed for Junius Creamer, NP working with Virgel Manifold, MD by Randa Evens, ED Scribe. This patient was seen in room WTR7/WTR7 and the patient's care was started at 8:04 PM.     Chief Complaint  Patient presents with  . Insect Bite  . Pruritis   The history is provided by the patient. No language interpreter was used.   HPI Comments: Collin Young is a 64 y.o. male who presents to the Emergency Department complaining of new intermittent itching to his lower abdomen and penis for the past week. Pt states that he has tried washing with no relief. Pt doesn't report any medications PTA. Pt deneis fever, rash or swelling.    Past Medical History  Diagnosis Date  . Hypertension   . Hyperlipemia   . HOH (hard of hearing)   . Seizures     alcohol withdrawls- 2001  . GERD (gastroesophageal reflux disease)     takes zantac prn  . Cancer     Esophagus- radiation 2011  . History of radiation therapy 10/19/2013-12/05/2013    pyriform sinus 69.96 Gy/78fx  . Headache    Past Surgical History  Procedure Laterality Date  . No past surgeries    . Cardiovascular stress test  10/09/09    normal nuclearr stress test, EF 57% [name])  . Laryngoscopy N/A 09/15/2013    Procedure: LARYNGOSCOPY;  Surgeon: [name], MD;  Location: [facility] OR;  Service: ENT;  Laterality: N/A;  direct laryngoscopy with biopsy and esophagoscopy   No family history on file. History  Substance Use Topics  . Smoking status: Former Smoker -- 1.00 packs/day for 40 years    Types: Cigarettes    Start date: 11/08/1960    Quit date: 09/01/1999  . Smokeless tobacco: Former Systems developer    Types: Theresa date: 09/01/1999     Comment: quit in 2011  . Alcohol Use: No     Comment: quit in 2001    Review of Systems  Constitutional: Negative for fever.  Respiratory: Negative for shortness of breath.   Cardiovascular: Negative for leg swelling.   Genitourinary: Negative for penile swelling and testicular pain.  Musculoskeletal: Negative for myalgias.  Skin: Negative for rash.  All other systems reviewed and are negative.    Allergies  Review of patient's allergies indicates no known allergies.  Home Medications   Prior to Admission medications   Medication Sig Start Date End Date Taking? Authorizing Provider  fentaNYL (DURAGESIC - DOSED MCG/HR) 12 MCG/HR Place 1 patch every 3 days to the skin 02/08/15   [name], MD  morphine (ROXANOL) 20 MG/ML concentrated solution Take 0.5 mLs (10 mg total) by mouth every 3 (three) hours as needed for severe pain. Patient not taking: Reported on 11/13/2014 07/16/14   [name], MD  Multiple Vitamin (MULTIVITAMIN WITH MINERALS) TABS tablet Take 1 tablet by mouth daily.    Historical Provider, MD  Sennosides (EX-LAX PO) Take 1 tablet by mouth daily as needed (for constipation).     Historical Provider, MD   BP 113/88 mmHg  Pulse 91  Temp(Src) 97.9 F (36.6 C) (Oral)  Resp 18  SpO2 99%   Physical Exam  Constitutional: He is oriented to person, place, and time. He appears well-developed and well-nourished. No distress.  HENT:  Head: Normocephalic and atraumatic.  Eyes: Conjunctivae and EOM are normal.  Neck: Normal range of motion. Neck supple. No tracheal deviation present.  Cardiovascular: Normal rate.   Pulmonary/Chest: Effort normal. No respiratory distress.  Abdominal: Soft.  Musculoskeletal: Normal range of motion.  Neurological: He is alert and oriented to person, place, and time.  Skin: Skin is warm and dry.  Psychiatric: He has a normal mood and affect. His behavior is normal.  Nursing note and vitals reviewed.   ED Course  Procedures (including critical care time) DIAGNOSTIC STUDIES: Oxygen Saturation is 99% on RA, normal by my interpretation.    COORDINATION OF CARE: 8:20 PM-Discussed treatment plan with pt at bedside and pt agreed to plan.     Labs  Review Labs Reviewed - No data to display  Imaging Review No results found.   EKG Interpretation None      MDM   Final diagnoses:  Perineal itching, male      I personally performed the services described in this documentation, which was scribed in my presence. The recorded information has been reviewed and is accurate.     Junius Creamer, NP 02/08/15 2034  Junius Creamer, NP 02/08/15 2034  Virgel Manifold, MD 02/09/15 2256

## 2015-02-12 ENCOUNTER — Other Ambulatory Visit: Payer: Self-pay

## 2015-02-12 ENCOUNTER — Ambulatory Visit: Payer: Self-pay | Admitting: Hematology & Oncology

## 2015-02-15 ENCOUNTER — Ambulatory Visit: Payer: Medicaid Other

## 2015-02-15 ENCOUNTER — Ambulatory Visit (HOSPITAL_BASED_OUTPATIENT_CLINIC_OR_DEPARTMENT_OTHER): Payer: Medicaid Other | Admitting: Hematology & Oncology

## 2015-02-15 ENCOUNTER — Other Ambulatory Visit (HOSPITAL_BASED_OUTPATIENT_CLINIC_OR_DEPARTMENT_OTHER): Payer: Medicaid Other

## 2015-02-15 VITALS — BP 120/71 | HR 86 | Temp 97.9°F | Resp 16 | Wt 128.0 lb

## 2015-02-15 DIAGNOSIS — C12 Malignant neoplasm of pyriform sinus: Secondary | ICD-10-CM

## 2015-02-15 DIAGNOSIS — E039 Hypothyroidism, unspecified: Secondary | ICD-10-CM

## 2015-02-15 DIAGNOSIS — R52 Pain, unspecified: Secondary | ICD-10-CM

## 2015-02-15 LAB — COMPREHENSIVE METABOLIC PANEL
ALBUMIN: 3.7 g/dL (ref 3.5–5.2)
ALT: 19 U/L (ref 0–53)
AST: 26 U/L (ref 0–37)
Alkaline Phosphatase: 35 U/L — ABNORMAL LOW (ref 39–117)
BUN: 22 mg/dL (ref 6–23)
CO2: 28 meq/L (ref 19–32)
Calcium: 8.9 mg/dL (ref 8.4–10.5)
Chloride: 102 mEq/L (ref 96–112)
Creatinine, Ser: 1.09 mg/dL (ref 0.50–1.35)
GLUCOSE: 119 mg/dL — AB (ref 70–99)
POTASSIUM: 3.7 meq/L (ref 3.5–5.3)
Sodium: 138 mEq/L (ref 135–145)
Total Bilirubin: 0.4 mg/dL (ref 0.2–1.2)
Total Protein: 6.7 g/dL (ref 6.0–8.3)

## 2015-02-15 LAB — CBC WITH DIFFERENTIAL (CANCER CENTER ONLY)
BASO#: 0 10*3/uL (ref 0.0–0.2)
BASO%: 0.3 % (ref 0.0–2.0)
EOS%: 5 % (ref 0.0–7.0)
Eosinophils Absolute: 0.2 10*3/uL (ref 0.0–0.5)
HCT: 39.5 % (ref 38.7–49.9)
HGB: 13.4 g/dL (ref 13.0–17.1)
LYMPH#: 0.8 10*3/uL — AB (ref 0.9–3.3)
LYMPH%: 25.1 % (ref 14.0–48.0)
MCH: 29.7 pg (ref 28.0–33.4)
MCHC: 33.9 g/dL (ref 32.0–35.9)
MCV: 88 fL (ref 82–98)
MONO#: 0.5 10*3/uL (ref 0.1–0.9)
MONO%: 15.5 % — AB (ref 0.0–13.0)
NEUT%: 54.1 % (ref 40.0–80.0)
NEUTROS ABS: 1.8 10*3/uL (ref 1.5–6.5)
PLATELETS: 186 10*3/uL (ref 145–400)
RBC: 4.51 10*6/uL (ref 4.20–5.70)
RDW: 13.4 % (ref 11.1–15.7)
WBC: 3.2 10*3/uL — ABNORMAL LOW (ref 4.0–10.0)

## 2015-02-15 MED ORDER — HEPARIN SOD (PORK) LOCK FLUSH 100 UNIT/ML IV SOLN
500.0000 [IU] | Freq: Once | INTRAVENOUS | Status: AC
  Administered 2015-02-15: 500 [IU] via INTRAVENOUS
  Filled 2015-02-15: qty 5

## 2015-02-15 MED ORDER — SODIUM CHLORIDE 0.9 % IJ SOLN
10.0000 mL | INTRAMUSCULAR | Status: DC | PRN
  Administered 2015-02-15: 10 mL via INTRAVENOUS
  Filled 2015-02-15: qty 10

## 2015-02-15 MED ORDER — FENTANYL 12 MCG/HR TD PT72: MEDICATED_PATCH | TRANSDERMAL | Status: DC

## 2015-02-15 NOTE — Progress Notes (Signed)
Hematology and Oncology Follow Up Visit  Collin Young [id number] [birth year] 64 y.o. 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  Young is back for followup. He is feeling well. He's had no problems with swallowing. His mouth is not to dry.  He does not have any pain. He has no bleeding or bruising.  He's had no nausea or vomiting. There's been no change in bowel or bladder habits.  He's had no leg swelling.  He is on the Duragesic patches. I will refill these today.  He's had no cough. There's been no shortness of breath. He's had no hemoptysis.   Overall, his performance status is ECOG 1  Medications:  Current outpatient prescriptions:  .  fentaNYL (DURAGESIC - DOSED MCG/HR) 12 MCG/HR, Place 1 patch every 3 days to the skin, Disp: 10 patch, Rfl: 0 .  Multiple Vitamin (MULTIVITAMIN WITH MINERALS) TABS tablet, Take 1 tablet by mouth daily., Disp: , Rfl:  .  Sennosides (EX-LAX PO), Take 1 tablet by mouth daily as needed (for constipation). , Disp: , Rfl:  No current facility-administered medications for this visit.  Facility-Administered Medications Ordered in Other Visits:  .  topical emolient (BIAFINE) emulsion, , Topical, Daily, Kyung Rudd, MD  Allergies: No Known Allergies  Past Medical History, Surgical history, Social history, and Family History were reviewed and updated.  Review of Systems: As above  Physical Exam:  weight is 128 lb (58.06 kg). His oral temperature is 97.9 F (36.6 C). His blood pressure is 120/71 and his pulse is 86. His respiration is 16.   Thin, African American gentleman. Head and neck exam shows no ocular or oral lesions. There is no adenopathy in the neck.  thyroid is nonpalpable. Lungs are clear. Cardiac exam regular rate and rhythm. There is no murmurs,  rubs or bruits.. Abdomen  is soft. He has good bowel sounds. The feeding tube site  is intact.  it is healing nicely. There is no erythema or swelling about the feeding tube.Back exam shows no tenderness over the spine, ribs or hips. Extremities shows no clubbing, cyanosis or edema. He has good strength in his legs. Skin exam shows  no rashes, ecchymoses or petechia. Neurological exam is nonfocal.  Lab Results  Component Value Date   WBC 3.2* 02/15/2015   HGB 13.4 02/15/2015   HCT 39.5 02/15/2015   MCV 88 02/15/2015   PLT 186 02/15/2015     Chemistry      Component Value Date/Time   NA 140 11/13/2014 1012   NA 133* 08/04/2014 2027   K 3.9 11/13/2014 1012   K 4.3 08/04/2014 2027   CL 102 11/13/2014 1012   CL 91* 08/04/2014 2027   CO2 31 11/13/2014 1012   CO2 29 08/04/2014 2027   BUN 15 11/13/2014 1012   BUN 15 08/04/2014 2027   CREATININE 1.0 11/13/2014 1012   CREATININE 0.82 08/04/2014 2027      Component Value Date/Time   CALCIUM 9.0 11/13/2014 1012   CALCIUM 10.0 08/04/2014 2027   ALKPHOS 43 11/13/2014 1012   ALKPHOS 49 01/28/2014 1225   AST 32 11/13/2014 1012   AST 20 01/28/2014 1225   ALT 27 11/13/2014 1012   ALT 13 01/28/2014 1225   BILITOT 0.60 11/13/2014 1012   BILITOT 0.3 01/28/2014 1225  Impression and Plan: Collin Young is 64 year old gentleman. He has a localized recurrence of carcinoma of the left pyriform sinus. He underwent concurrent chemotherapy and radiation therapy. He finished his treatment back in April of 2015.   I will refill his Duragesic patch. This is helping him. He is off any other pain medication. At some point, we may want to consider getting him off the Duragesic patch. However, for right now, it seems to be having a positive impact on his quality of life.  I don't see that we have to do any scans on him.  I will plan to get back to see [place] in another 3-4 months.  He'll have his Port-A-Cath flushed in 2 months. [name], MD 6/17/201611:28 AM

## 2015-02-15 NOTE — Patient Instructions (Signed)

## 2015-02-25 ENCOUNTER — Encounter (HOSPITAL_COMMUNITY): Payer: Self-pay | Admitting: Dentistry

## 2015-02-26 ENCOUNTER — Encounter (HOSPITAL_COMMUNITY): Payer: Self-pay | Admitting: Dentistry

## 2015-03-12 ENCOUNTER — Ambulatory Visit (HOSPITAL_COMMUNITY): Payer: Medicaid - Dental | Admitting: Dentistry

## 2015-03-12 ENCOUNTER — Encounter (HOSPITAL_COMMUNITY): Payer: Self-pay | Admitting: Dentistry

## 2015-03-12 VITALS — BP 131/65 | HR 72 | Temp 98.1°F

## 2015-03-12 DIAGNOSIS — K08109 Complete loss of teeth, unspecified cause, unspecified class: Secondary | ICD-10-CM

## 2015-03-12 DIAGNOSIS — Z923 Personal history of irradiation: Secondary | ICD-10-CM

## 2015-03-12 DIAGNOSIS — Z9221 Personal history of antineoplastic chemotherapy: Secondary | ICD-10-CM

## 2015-03-12 DIAGNOSIS — Z463 Encounter for fitting and adjustment of dental prosthetic device: Secondary | ICD-10-CM

## 2015-03-12 DIAGNOSIS — K000 Anodontia: Secondary | ICD-10-CM

## 2015-03-12 DIAGNOSIS — C12 Malignant neoplasm of pyriform sinus: Secondary | ICD-10-CM

## 2015-03-12 DIAGNOSIS — K117 Disturbances of salivary secretion: Secondary | ICD-10-CM

## 2015-03-12 DIAGNOSIS — K0820 Unspecified atrophy of edentulous alveolar ridge: Secondary | ICD-10-CM

## 2015-03-12 DIAGNOSIS — Z972 Presence of dental prosthetic device (complete) (partial): Secondary | ICD-10-CM

## 2015-03-12 DIAGNOSIS — R682 Dry mouth, unspecified: Secondary | ICD-10-CM

## 2015-03-12 NOTE — Progress Notes (Signed)
03/12/2015  Patient Name:   Collin Young Date of Birth:   02-07-1951 Medical Record Number: [id number]  BP 131/65 mmHg  Pulse 72  Temp(Src) 98.1 F (36.7 C) (Oral)  CC: Collin Young is a 64 year old male that presents for periodic oral exam and evaluation of upper and lower complete dentures.  HPI: The patient has a history of squamous cell carcinoma left base of tongue and piriform sinus.  The patient received chemoradiation therapy from 10/19/2013 thru 12/05/2013 with Dr. Lisbeth Renshaw. Upper and lower complete dentures were then fabricated and inserted on 04/18/2014. Patient has been followed for multiple denture adjustments since that time. Patient now presents for denture recall.  Medical Hx Update:  Past Medical History  Diagnosis Date  . Hypertension   . Hyperlipemia   . HOH (hard of hearing)   . Seizures     alcohol withdrawls- 2001  . GERD (gastroesophageal reflux disease)     takes zantac prn  . Cancer     Esophagus- radiation 2011  . History of radiation therapy 10/19/2013-12/05/2013    pyriform sinus 69.96 Gy/36fx  . Headache   .  Past Surgical History  Procedure Laterality Date  . No past surgeries    . Cardiovascular stress test  10/09/09    normal nuclearr stress test, EF 57% [name])  . Laryngoscopy N/A 09/15/2013    Procedure: LARYNGOSCOPY;  Surgeon: [name], MD;  Location: [facility] OR;  Service: ENT;  Laterality: N/A;  direct laryngoscopy with biopsy and esophagoscopy    ALLERGIES/ADVERSE DRUG REACTIONS: No Known Allergies  MEDICATIONS: Current Outpatient Prescriptions  Medication Sig Dispense Refill  . fentaNYL (DURAGESIC - DOSED MCG/HR) 12 MCG/HR Place 1 patch every 3 days to the skin 10 patch 0  . Multiple Vitamin (MULTIVITAMIN WITH MINERALS) TABS tablet Take 1 tablet by mouth daily.    . Sennosides (EX-LAX PO) Take 1 tablet by mouth daily as needed (for constipation).      No current facility-administered medications for this visit.    Facility-Administered Medications Ordered in Other Visits  Medication Dose Route Frequency Provider Last Rate Last Dose  . topical emolient (BIAFINE) emulsion   Topical Daily Kyung Rudd, MD        CC: Collin Young is a 64 year old male that presents for periodic oral exam and evaluation of upper and lower complete dentures.  HPI: The patient has a history of squamous cell carcinoma left base of tongue and piriform sinus.  The patient received chemoradiation therapy from 10/19/2013 thru 12/05/2013 with Dr. Lisbeth Renshaw. Upper and lower complete dentures were then fabricated and inserted on 04/18/2014. Patient has been followed for multiple denture adjustments since that time. Patient now presents for denture recall.  DENTAL EXAM: General:  The patient is a well-developed, slightly built male in no acute distress. Vitals: BP 131/65 mmHg  Pulse 72  Temp(Src) 98.1 F (36.7 C) (Oral) Extraoral Exam:  There is no palpable submandibular lymphadenopathy. The patient denies acute TMJ symptoms. Intraoral  Exam:  The patient has significant xerostomia. There is significant atrophy of the edentulous alveolar ridges.  There is no evidence of denture irritation or erythema.  Dentition:  The patient is edentulous. Prosthodontic:  The patient has upper and lower complete dentures that are stable and retentive. Pressure indicating paste was applied to dentures and minimal adjustments were made. [place] Patient accepts results of the adjustment. Occlusion:   The occlusion was evaluated and centric relation and protrusive strokes. Minimal adjustments were  made. Patient has a tendency to protrude mandible to an end-to-end position. Patient was instructed on finding the maximum intercuspation position. Patient expresses understanding.  Assessments: 1. History of squamous cell carcinoma of the piriform sinus 2. Status post chemoradiation therapy 3. Patient has xerostomia 4. The patient is  edentulous 5. Atrophy of the edentulous alveolar ridges 6. Upper and lower complete dentures  Plan:  1. Patient is to keep dentures out if sore spots arise. 2. Use salt water rinses as needed to aid healing. 3. Return to clinic as scheduled or call if problems arise before then.  Collin Young, DDS

## 2015-03-12 NOTE — Patient Instructions (Signed)
Keep dentures out if sore spots arise. Use salt water rinses as needed to aid healing. Return to clinic as scheduled or call if problems arise before then. Dr. Kulinski 

## 2015-03-13 ENCOUNTER — Encounter (HOSPITAL_COMMUNITY): Payer: Self-pay | Admitting: Dentistry

## 2015-03-18 ENCOUNTER — Other Ambulatory Visit: Payer: Self-pay

## 2015-03-18 DIAGNOSIS — E039 Hypothyroidism, unspecified: Secondary | ICD-10-CM

## 2015-03-18 DIAGNOSIS — C12 Malignant neoplasm of pyriform sinus: Secondary | ICD-10-CM

## 2015-03-18 MED ORDER — FENTANYL 12 MCG/HR TD PT72: MEDICATED_PATCH | TRANSDERMAL | Status: DC

## 2015-04-23 ENCOUNTER — Other Ambulatory Visit: Payer: Self-pay | Admitting: *Deleted

## 2015-04-23 DIAGNOSIS — C12 Malignant neoplasm of pyriform sinus: Secondary | ICD-10-CM

## 2015-04-23 DIAGNOSIS — E039 Hypothyroidism, unspecified: Secondary | ICD-10-CM

## 2015-04-23 MED ORDER — FENTANYL 12 MCG/HR TD PT72: MEDICATED_PATCH | TRANSDERMAL | Status: DC

## 2015-04-29 ENCOUNTER — Other Ambulatory Visit: Payer: Self-pay | Admitting: Hematology & Oncology

## 2015-04-29 ENCOUNTER — Telehealth: Payer: Self-pay | Admitting: Hematology & Oncology

## 2015-04-29 DIAGNOSIS — C12 Malignant neoplasm of pyriform sinus: Secondary | ICD-10-CM

## 2015-04-29 NOTE — Telephone Encounter (Signed)
Contacted pt regarding MRI of neck at [facility] on 8/31 at 7:45 arrival. Pt confirmed appt

## 2015-05-01 ENCOUNTER — Other Ambulatory Visit: Payer: Self-pay | Admitting: Hematology & Oncology

## 2015-05-01 ENCOUNTER — Ambulatory Visit (HOSPITAL_COMMUNITY)
Admission: RE | Admit: 2015-05-01 | Discharge: 2015-05-01 | Disposition: A | Discharge status: Home / Self Care | Payer: Medicaid Other | Source: Ambulatory Visit | Attending: Hematology & Oncology | Admitting: Hematology & Oncology

## 2015-05-01 DIAGNOSIS — C12 Malignant neoplasm of pyriform sinus: Secondary | ICD-10-CM | POA: Diagnosis not present

## 2015-05-01 DIAGNOSIS — R490 Dysphonia: Secondary | ICD-10-CM | POA: Diagnosis present

## 2015-05-01 DIAGNOSIS — Z923 Personal history of irradiation: Secondary | ICD-10-CM | POA: Diagnosis not present

## 2015-05-01 DIAGNOSIS — R609 Edema, unspecified: Secondary | ICD-10-CM | POA: Insufficient documentation

## 2015-05-01 LAB — POCT I-STAT CREATININE: Creatinine, Ser: 1.3 mg/dL — ABNORMAL HIGH (ref 0.61–1.24)

## 2015-05-01 MED ORDER — GADOBENATE DIMEGLUMINE 529 MG/ML IV SOLN
15.0000 mL | Freq: Once | INTRAVENOUS | Status: AC | PRN
  Administered 2015-05-01: 13 mL via INTRAVENOUS

## 2015-05-02 ENCOUNTER — Telehealth: Payer: Self-pay | Admitting: *Deleted

## 2015-05-02 NOTE — Telephone Encounter (Addendum)
Patient aware of results.  ----- Message from [name], MD sent at 05/01/2015  5:39 PM EDT ----- Call - No obvious recurrent cancer!!  Collin Young

## 2015-05-16 ENCOUNTER — Other Ambulatory Visit: Payer: Medicaid Other

## 2015-05-16 ENCOUNTER — Ambulatory Visit: Payer: Self-pay | Admitting: Hematology & Oncology

## 2015-05-23 ENCOUNTER — Other Ambulatory Visit: Payer: Self-pay | Admitting: *Deleted

## 2015-05-23 DIAGNOSIS — C12 Malignant neoplasm of pyriform sinus: Secondary | ICD-10-CM

## 2015-05-23 DIAGNOSIS — E039 Hypothyroidism, unspecified: Secondary | ICD-10-CM

## 2015-05-23 MED ORDER — FENTANYL 12 MCG/HR TD PT72: MEDICATED_PATCH | TRANSDERMAL | Status: DC

## 2015-06-26 NOTE — Therapy (Unsigned)
[facility] [address] [place], [place], [address] Phone: [phone]   Fax:  [phone]  Patient Details  Name: Collin Young MRN: [id number] Date of Birth: 28-Jan-1951 Referring Provider:  No ref. provider found  Encounter Date: 06/26/2015 Speech therapy discharge summary Pt was seen for 4 visits in 11 months. ST was waiting on insurance coverage as pt did not wish to have selfpay status.   Status of long term goals below:  SLP LONG TERM GOAL #1      Title  pt will demo HEP with modified independence     Status Not met                  SLP LONG TERM GOAL #2     Title  pt will tell SLP two possible foods to eat while healing from head/neck rad tx     Status Not Met                   SLP LONG TERM GOAL #3     Title  pt will tell SLP 3 signs of aspiration PNA with modified independence                    Status  Achieved       SLP read pt's last rad onc note from June 2016 and pt did not complain of any lingering swallowing issues.  Long term goals were partially met. Pt will be discharged due to not arriving/scheduling since his last visit. Because of this, SLP assumes pt is satisfied with his current swallowing ability.     [facility] , [place], [place]  06/26/2015, 3:08 PM  [facility] [address] [place], [place], [address] Phone: [phone]   Fax:  [phone]

## 2015-07-01 ENCOUNTER — Other Ambulatory Visit: Payer: Self-pay | Admitting: *Deleted

## 2015-07-01 DIAGNOSIS — E039 Hypothyroidism, unspecified: Secondary | ICD-10-CM

## 2015-07-01 DIAGNOSIS — C12 Malignant neoplasm of pyriform sinus: Secondary | ICD-10-CM

## 2015-07-01 MED ORDER — FENTANYL 12 MCG/HR TD PT72: MEDICATED_PATCH | TRANSDERMAL | Status: DC

## 2015-08-14 ENCOUNTER — Other Ambulatory Visit: Payer: Self-pay | Admitting: *Deleted

## 2015-08-14 DIAGNOSIS — E039 Hypothyroidism, unspecified: Secondary | ICD-10-CM

## 2015-08-14 DIAGNOSIS — C12 Malignant neoplasm of pyriform sinus: Secondary | ICD-10-CM

## 2015-08-14 MED ORDER — FENTANYL 12 MCG/HR TD PT72: MEDICATED_PATCH | TRANSDERMAL | Status: DC

## 2015-09-24 ENCOUNTER — Other Ambulatory Visit: Payer: Self-pay | Admitting: *Deleted

## 2015-09-24 DIAGNOSIS — E039 Hypothyroidism, unspecified: Secondary | ICD-10-CM

## 2015-09-24 DIAGNOSIS — C12 Malignant neoplasm of pyriform sinus: Secondary | ICD-10-CM

## 2015-09-24 MED ORDER — FENTANYL 12 MCG/HR TD PT72: MEDICATED_PATCH | TRANSDERMAL | Status: DC

## 2015-09-25 MED FILL — fentaNYL 12 MCG/HR PT72: 12 | 30 days supply | Qty: 10 | Fill #0

## 2015-10-09 ENCOUNTER — Ambulatory Visit
Admission: RE | Admit: 2015-10-09 | Discharge: 2015-10-09 | Disposition: A | Discharge status: Home / Self Care | Payer: Medicaid Other | Source: Ambulatory Visit | Attending: Internal Medicine | Admitting: Internal Medicine

## 2015-10-09 ENCOUNTER — Other Ambulatory Visit: Payer: Self-pay | Admitting: Internal Medicine

## 2015-10-09 DIAGNOSIS — R05 Cough: Secondary | ICD-10-CM

## 2015-10-09 DIAGNOSIS — R059 Cough, unspecified: Secondary | ICD-10-CM

## 2015-10-29 ENCOUNTER — Other Ambulatory Visit: Payer: Self-pay | Admitting: *Deleted

## 2015-10-29 DIAGNOSIS — E039 Hypothyroidism, unspecified: Secondary | ICD-10-CM

## 2015-10-29 DIAGNOSIS — C12 Malignant neoplasm of pyriform sinus: Secondary | ICD-10-CM

## 2015-10-29 MED ORDER — FENTANYL 12 MCG/HR TD PT72: MEDICATED_PATCH | TRANSDERMAL | Status: DC

## 2015-10-31 MED FILL — fentaNYL 12 MCG/HR PT72: 12 | 30 days supply | Qty: 10 | Fill #0

## 2015-12-04 ENCOUNTER — Other Ambulatory Visit: Payer: Self-pay | Admitting: *Deleted

## 2015-12-04 DIAGNOSIS — E039 Hypothyroidism, unspecified: Secondary | ICD-10-CM

## 2015-12-04 DIAGNOSIS — C12 Malignant neoplasm of pyriform sinus: Secondary | ICD-10-CM

## 2015-12-04 MED ORDER — FENTANYL 12 MCG/HR TD PT72: MEDICATED_PATCH | TRANSDERMAL | Status: DC

## 2015-12-05 MED FILL — fentaNYL 12 MCG/HR PT72: 12 | 30 days supply | Qty: 10 | Fill #0

## 2016-01-16 ENCOUNTER — Other Ambulatory Visit: Payer: Self-pay | Admitting: *Deleted

## 2016-01-16 DIAGNOSIS — E039 Hypothyroidism, unspecified: Secondary | ICD-10-CM

## 2016-01-16 DIAGNOSIS — C12 Malignant neoplasm of pyriform sinus: Secondary | ICD-10-CM

## 2016-01-16 MED ORDER — FENTANYL 12 MCG/HR TD PT72: MEDICATED_PATCH | TRANSDERMAL | Status: DC

## 2016-01-17 MED FILL — fentaNYL 12 MCG/HR PT72: 12 | 30 days supply | Qty: 10 | Fill #0

## 2016-02-24 ENCOUNTER — Other Ambulatory Visit: Payer: Self-pay | Admitting: *Deleted

## 2016-02-24 DIAGNOSIS — E039 Hypothyroidism, unspecified: Secondary | ICD-10-CM

## 2016-02-24 DIAGNOSIS — C12 Malignant neoplasm of pyriform sinus: Secondary | ICD-10-CM

## 2016-02-24 MED ORDER — FENTANYL 12 MCG/HR TD PT72: MEDICATED_PATCH | TRANSDERMAL | Status: DC

## 2016-02-25 ENCOUNTER — Ambulatory Visit: Payer: Self-pay | Admitting: Hematology & Oncology

## 2016-02-25 ENCOUNTER — Other Ambulatory Visit: Payer: Medicaid Other

## 2016-03-04 ENCOUNTER — Other Ambulatory Visit: Payer: Self-pay | Admitting: Nurse Practitioner

## 2016-03-04 DIAGNOSIS — C12 Malignant neoplasm of pyriform sinus: Secondary | ICD-10-CM

## 2016-03-05 ENCOUNTER — Other Ambulatory Visit (HOSPITAL_BASED_OUTPATIENT_CLINIC_OR_DEPARTMENT_OTHER): Payer: Medicare Other

## 2016-03-05 ENCOUNTER — Ambulatory Visit (HOSPITAL_BASED_OUTPATIENT_CLINIC_OR_DEPARTMENT_OTHER): Payer: Medicare Other | Admitting: Hematology & Oncology

## 2016-03-05 ENCOUNTER — Ambulatory Visit (HOSPITAL_BASED_OUTPATIENT_CLINIC_OR_DEPARTMENT_OTHER): Payer: Medicare Other

## 2016-03-05 VITALS — BP 116/55 | HR 83 | Temp 98.0°F | Resp 18 | Ht 66.0 in | Wt 125.0 lb

## 2016-03-05 DIAGNOSIS — C12 Malignant neoplasm of pyriform sinus: Secondary | ICD-10-CM

## 2016-03-05 DIAGNOSIS — C159 Malignant neoplasm of esophagus, unspecified: Secondary | ICD-10-CM | POA: Diagnosis not present

## 2016-03-05 DIAGNOSIS — E038 Other specified hypothyroidism: Secondary | ICD-10-CM

## 2016-03-05 LAB — CBC WITH DIFFERENTIAL (CANCER CENTER ONLY)
BASO#: 0 10*3/uL (ref 0.0–0.2)
BASO%: 0 % (ref 0.0–2.0)
EOS ABS: 0.1 10*3/uL (ref 0.0–0.5)
EOS%: 1.6 % (ref 0.0–7.0)
HEMATOCRIT: 41.1 % (ref 38.7–49.9)
HGB: 14 g/dL (ref 13.0–17.1)
LYMPH#: 0.9 10*3/uL (ref 0.9–3.3)
LYMPH%: 25.3 % (ref 14.0–48.0)
MCH: 29.1 pg (ref 28.0–33.4)
MCHC: 34.1 g/dL (ref 32.0–35.9)
MCV: 85 fL (ref 82–98)
MONO#: 0.6 10*3/uL (ref 0.1–0.9)
MONO%: 15.6 % — ABNORMAL HIGH (ref 0.0–13.0)
NEUT#: 2.1 10*3/uL (ref 1.5–6.5)
NEUT%: 57.5 % (ref 40.0–80.0)
Platelets: 192 10*3/uL (ref 145–400)
RBC: 4.81 10*6/uL (ref 4.20–5.70)
RDW: 13.7 % (ref 11.1–15.7)
WBC: 3.7 10*3/uL — ABNORMAL LOW (ref 4.0–10.0)

## 2016-03-05 LAB — COMPREHENSIVE METABOLIC PANEL
ALBUMIN: 3.7 g/dL (ref 3.5–5.0)
ALK PHOS: 42 U/L (ref 40–150)
ALT: 20 U/L (ref 0–55)
AST: 26 U/L (ref 5–34)
Anion Gap: 9 mEq/L (ref 3–11)
BUN: 19.3 mg/dL (ref 7.0–26.0)
CO2: 27 mEq/L (ref 22–29)
Calcium: 9.4 mg/dL (ref 8.4–10.4)
Chloride: 101 mEq/L (ref 98–109)
Creatinine: 1.2 mg/dL (ref 0.7–1.3)
EGFR: 74 mL/min/{1.73_m2} — AB (ref >90)
GLUCOSE: 129 mg/dL (ref 70–140)
Potassium: 4.1 mEq/L (ref 3.5–5.1)
SODIUM: 136 meq/L (ref 136–145)
TOTAL PROTEIN: 7.4 g/dL (ref 6.4–8.3)

## 2016-03-05 LAB — TSH: TSH: 2.528 m(IU)/L (ref 0.320–4.118)

## 2016-03-05 LAB — LACTATE DEHYDROGENASE: LDH: 197 U/L (ref 125–245)

## 2016-03-05 MED ORDER — HEPARIN SOD (PORK) LOCK FLUSH 100 UNIT/ML IV SOLN
500.0000 [IU] | Freq: Once | INTRAVENOUS | Status: AC
  Administered 2016-03-05: 500 [IU] via INTRAVENOUS
  Filled 2016-03-05: qty 5

## 2016-03-05 MED ORDER — SODIUM CHLORIDE 0.9% FLUSH
10.0000 mL | INTRAVENOUS | Status: DC | PRN
  Administered 2016-03-05: 10 mL via INTRAVENOUS
  Filled 2016-03-05: qty 10

## 2016-03-05 MED FILL — fentaNYL 12 MCG/HR PT72: 12 | 30 days supply | Qty: 10 | Fill #0

## 2016-03-05 NOTE — Progress Notes (Signed)
Hematology and Oncology Follow Up Visit  Collin Young [id number] 09-17-1950 65 y.o. 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  Young is back for followup. He is feeling well. He's had no problems with swallowing. His mouth is not too dry.  He does not have any pain. He has no bleeding or bruising.  He's had no nausea or vomiting. There's been no change in bowel or bladder habits.  He's had no leg swelling.  He is on the Duragesic patches. These are really helped with pain control. He is much more comfortable using these.  He's had no cough. There's been no shortness of breath. He's had no hemoptysis.   Overall, his performance status is ECOG 1  Medications:  Current outpatient prescriptions:  .  fentaNYL (DURAGESIC - DOSED MCG/HR) 12 MCG/HR, Place 1 patch every 3 days to the skin, Disp: 10 patch, Rfl: 0 .  Multiple Vitamin (MULTIVITAMIN WITH MINERALS) TABS tablet, Take 1 tablet by mouth daily., Disp: , Rfl:  .  Sennosides (EX-LAX PO), Take 1 tablet by mouth daily as needed (for constipation). , Disp: , Rfl:  No current facility-administered medications for this visit.  Facility-Administered Medications Ordered in Other Visits:  .  sodium chloride flush (NS) 0.9 % injection 10 mL, 10 mL, Intravenous, PRN, [name], MD, 10 mL at 03/05/16 1350 .  topical emolient (BIAFINE) emulsion, , Topical, Daily, Kyung Rudd, MD  Allergies: No Known Allergies  Past Medical History, Surgical history, Social history, and Family History were reviewed and updated.  Review of Systems: As above  Physical Exam:  height is 5\' 6"  (1.676 m) and weight is 125 lb (56.7 kg). His oral temperature is 98 F (36.7 C). His blood pressure is 116/55 and his pulse is 83. His respiration is 18.   Thin, African  American gentleman. Head and neck exam shows no ocular or oral lesions. There is no adenopathy in the neck.  thyroid is nonpalpable. Lungs are clear. Cardiac exam regular rate and rhythm. There is no murmurs, rubs or bruits.. Abdomen  is soft. He has good bowel sounds. The feeding tube site  is intact.  it is healing nicely. There is no erythema or swelling about the feeding tube.Back exam shows no tenderness over the spine, ribs or hips. Extremities shows no clubbing, cyanosis or edema. He has good strength in his legs. Skin exam shows  no rashes, ecchymoses or petechia. Neurological exam is nonfocal.  Lab Results  Component Value Date   WBC 3.7* 03/05/2016   HGB 14.0 03/05/2016   HCT 41.1 03/05/2016   MCV 85 03/05/2016   PLT 192 03/05/2016     Chemistry      Component Value Date/Time   NA 138 02/15/2015 1104   NA 140 11/13/2014 1012   K 3.7 02/15/2015 1104   K 3.9 11/13/2014 1012   CL 102 02/15/2015 1104   CL 102 11/13/2014 1012   CO2 28 02/15/2015 1104   CO2 31 11/13/2014 1012   BUN 22 02/15/2015 1104   BUN 15 11/13/2014 1012   CREATININE 1.30* 05/01/2015 0820   CREATININE 1.0 11/13/2014 1012      Component Value Date/Time   CALCIUM 8.9 02/15/2015 1104   CALCIUM 9.0 11/13/2014 1012   ALKPHOS 35* 02/15/2015 1104   ALKPHOS  43 11/13/2014 1012   AST 26 02/15/2015 1104   AST 32 11/13/2014 1012   ALT 19 02/15/2015 1104   ALT 27 11/13/2014 1012   BILITOT 0.4 02/15/2015 1104   BILITOT 0.60 11/13/2014 1012         Impression and Plan: Collin Young is 65 year old gentleman. He had a localized recurrence of carcinoma of the left pyriform sinus. He underwent concurrent chemotherapy and radiation therapy. He finished his treatment back in April of 2015.   IWill plan to see him back in one year. He knows that he was coming back sooner if he has any issues.  I'm glad that he his wife will be going to [place] to the [employer] Hopefully, they will bring back  pictures so I can see.  He'll have his Port-A-Cath flushed in 2 months. [name], MD 7/6/20172:46 PM

## 2016-03-05 NOTE — Patient Instructions (Signed)

## 2016-03-16 ENCOUNTER — Encounter (HOSPITAL_COMMUNITY): Payer: Self-pay | Admitting: Dentistry

## 2016-03-16 ENCOUNTER — Ambulatory Visit (HOSPITAL_COMMUNITY): Payer: Medicaid - Dental | Admitting: Dentistry

## 2016-03-16 VITALS — BP 148/64 | HR 66 | Temp 97.6°F

## 2016-03-16 DIAGNOSIS — Z923 Personal history of irradiation: Secondary | ICD-10-CM

## 2016-03-16 DIAGNOSIS — Z972 Presence of dental prosthetic device (complete) (partial): Secondary | ICD-10-CM

## 2016-03-16 DIAGNOSIS — R682 Dry mouth, unspecified: Secondary | ICD-10-CM

## 2016-03-16 DIAGNOSIS — Z463 Encounter for fitting and adjustment of dental prosthetic device: Secondary | ICD-10-CM | POA: Diagnosis not present

## 2016-03-16 DIAGNOSIS — C12 Malignant neoplasm of pyriform sinus: Secondary | ICD-10-CM

## 2016-03-16 DIAGNOSIS — K0820 Unspecified atrophy of edentulous alveolar ridge: Secondary | ICD-10-CM

## 2016-03-16 DIAGNOSIS — Z9221 Personal history of antineoplastic chemotherapy: Secondary | ICD-10-CM

## 2016-03-16 DIAGNOSIS — K117 Disturbances of salivary secretion: Secondary | ICD-10-CM

## 2016-03-16 DIAGNOSIS — K08109 Complete loss of teeth, unspecified cause, unspecified class: Secondary | ICD-10-CM

## 2016-03-16 NOTE — Patient Instructions (Addendum)
Plan:  1. Patient is to keep dentures out if sore spots arise. 2. Use salt water rinses as needed to aid healing. 3. Return to clinic as scheduled or call if problems arise before then. 4. Call if he desires to proceed with upper and lower denture reline procedures.  Lenn Cal, DDS

## 2016-03-16 NOTE — Progress Notes (Signed)
03/16/2016  Patient Name:   Collin Young Date of Birth:   [birth year] Medical Record Number: [id number]  BP 148/64 mmHg  Pulse 66  Temp(Src) 97.6 F (36.4 C) (Oral)  CC: Collin Young is a 65 year old male that presents for periodic oral exam and evaluation of upper and lower complete dentures.  HPI: The patient has a history of squamous cell carcinoma of the left base of tongue and piriform sinus.  The patient received chemoradiation therapy from 10/19/2013 thru 12/05/2013 with Dr. Lisbeth Renshaw. Upper and lower complete dentures were then fabricated and inserted on 04/18/2014. Patient has been followed for multiple denture adjustments since that time. Patient now presents for denture recall.  Medical Hx Update:  Past Medical History  Diagnosis Date  . Hypertension   . Hyperlipemia   . HOH (hard of hearing)   . Seizures ([place])     alcohol withdrawls- 2001  . GERD (gastroesophageal reflux disease)     takes zantac prn  . Cancer [facility])     Esophagus- radiation 2011  . History of radiation therapy 10/19/2013-12/05/2013    pyriform sinus 69.96 Gy/71fx  . Headache   .  Past Surgical History  Procedure Laterality Date  . No past surgeries    . Cardiovascular stress test  10/09/09    normal nuclearr stress test, EF 57% [name])  . Laryngoscopy N/A 09/15/2013    Procedure: LARYNGOSCOPY;  Surgeon: [name], MD;  Location: [facility] OR;  Service: ENT;  Laterality: N/A;  direct laryngoscopy with biopsy and esophagoscopy    ALLERGIES/ADVERSE DRUG REACTIONS: No Known Allergies  MEDICATIONS: Current Outpatient Prescriptions  Medication Sig Dispense Refill  . fentaNYL (DURAGESIC - DOSED MCG/HR) 12 MCG/HR Place 1 patch every 3 days to the skin 10 patch 0  . Multiple Vitamin (MULTIVITAMIN WITH MINERALS) TABS tablet Take 1 tablet by mouth daily.    . Sennosides (EX-LAX PO) Take 1 tablet by mouth daily as needed (for constipation).      No current facility-administered medications for this  visit.   Facility-Administered Medications Ordered in Other Visits  Medication Dose Route Frequency Provider Last Rate Last Dose  . topical emolient (BIAFINE) emulsion   Topical Daily Kyung Rudd, MD        CC: Collin Young is a 65 year old male that presents for periodic oral exam and evaluation of upper and lower complete dentures.  HPI: The patient has a history of squamous cell carcinoma left base of tongue and piriform sinus.  The patient received chemoradiation therapy from 10/19/2013 thru 12/05/2013 with Dr. Lisbeth Renshaw. Upper and lower complete dentures were then fabricated and inserted on 04/18/2014. Patient has been followed for multiple denture adjustments since that time. Patient now presents for denture recall. Patient is not having any problems with his dentures. Patient is eating well. Patient no longer has the feeding tube.  DENTAL EXAM: General:  The patient is a well-developed, slightly built male in no acute distress. Vitals: BP 148/64 mmHg  Pulse 66  Temp(Src) 97.6 F (36.4 C) (Oral) Extraoral Exam:  There is no palpable submandibular lymphadenopathy. The patient denies acute TMJ symptoms. Intraoral  Exam:  The patient has xerostomia. There is significant atrophy of the edentulous alveolar ridges.  There is no evidence of denture irritation or erythema.  Dentition:  The patient is edentulous. Prosthodontic:  The patient has upper and lower complete dentures that are stable and retentive. Pressure indicating paste was applied to dentures and adjustments were made as  needed.  Significant adjustements needed for lower denture and denture borders. [place]). Patient accepts results of the adjustment. Occlusion:   The occlusion was evaluated and centric relation and protrusive strokes. Adjustments were made. Patient has a tendency to protrude mandible to an end-to-end position. Patient was instructed on finding the maximum intercuspation position again. Patient expresses  understanding.  Assessments: 1. History of squamous cell carcinoma of the pyriform sinus 2. Status post chemoradiation therapy 3. Patient has xerostomia 4. The patient is edentulous 5. Atrophy of the edentulous alveolar ridges 6. Upper and lower complete dentures  Plan:  1. Patient is to keep dentures out if sore spots arise. 2. Use salt water rinses as needed to aid healing. 3. Return to clinic as scheduled or call if problems arise before then. 4. We discussed proceeding with upper and lower complete denture relines. Patient refused at this time. Patient will call back if he decides he wishes to proceed with denture reline procedures.  Lenn Cal, DDS

## 2016-03-17 ENCOUNTER — Encounter (HOSPITAL_COMMUNITY): Payer: Self-pay | Admitting: Dentistry

## 2016-04-06 ENCOUNTER — Other Ambulatory Visit: Payer: Self-pay | Admitting: Nurse Practitioner

## 2016-04-06 DIAGNOSIS — C12 Malignant neoplasm of pyriform sinus: Secondary | ICD-10-CM

## 2016-04-06 DIAGNOSIS — E039 Hypothyroidism, unspecified: Secondary | ICD-10-CM

## 2016-04-06 MED ORDER — FENTANYL 12 MCG/HR TD PT72: MEDICATED_PATCH | TRANSDERMAL | 0 refills | Status: DC

## 2016-04-07 MED FILL — fentaNYL 12 MCG/HR PT72: 12 | 30 days supply | Qty: 10 | Fill #0

## 2016-05-11 ENCOUNTER — Other Ambulatory Visit: Payer: Self-pay | Admitting: Nurse Practitioner

## 2016-05-11 ENCOUNTER — Ambulatory Visit (HOSPITAL_BASED_OUTPATIENT_CLINIC_OR_DEPARTMENT_OTHER): Payer: Medicare Other

## 2016-05-11 VITALS — BP 150/68 | HR 79 | Temp 98.2°F | Resp 16 | Wt 123.0 lb

## 2016-05-11 DIAGNOSIS — C12 Malignant neoplasm of pyriform sinus: Secondary | ICD-10-CM | POA: Diagnosis not present

## 2016-05-11 DIAGNOSIS — E039 Hypothyroidism, unspecified: Secondary | ICD-10-CM

## 2016-05-11 DIAGNOSIS — Z452 Encounter for adjustment and management of vascular access device: Secondary | ICD-10-CM | POA: Diagnosis not present

## 2016-05-11 MED ORDER — FENTANYL 12 MCG/HR TD PT72: MEDICATED_PATCH | TRANSDERMAL | 0 refills | Status: DC

## 2016-05-11 MED ORDER — SODIUM CHLORIDE 0.9% FLUSH
10.0000 mL | INTRAVENOUS | Status: DC | PRN
  Administered 2016-05-11: 10 mL via INTRAVENOUS
  Filled 2016-05-11: qty 10

## 2016-05-11 MED ORDER — HEPARIN SOD (PORK) LOCK FLUSH 100 UNIT/ML IV SOLN
500.0000 [IU] | Freq: Once | INTRAVENOUS | Status: AC
  Administered 2016-05-11: 500 [IU] via INTRAVENOUS
  Filled 2016-05-11: qty 5

## 2016-05-11 NOTE — Patient Instructions (Signed)

## 2016-05-15 MED FILL — fentaNYL 12 MCG/HR PT72: 12 | 30 days supply | Qty: 10 | Fill #0

## 2016-06-11 ENCOUNTER — Telehealth: Payer: Self-pay | Admitting: Radiation Oncology

## 2016-06-11 ENCOUNTER — Telehealth: Payer: Self-pay | Admitting: *Deleted

## 2016-06-11 DIAGNOSIS — C12 Malignant neoplasm of pyriform sinus: Secondary | ICD-10-CM

## 2016-06-11 NOTE — Telephone Encounter (Signed)
I called the patient to see if he would like to come back to the cancer center here in [facility] for continued follow up of his H&N cancer. He is now eligible to be seen in survivorship clinic, and to alternate visits along with Dr. Marin Olp who continues to see him. He is interested in getting back into the office for long term follow up and a referral will be placed. He prefers Monday appointments, and I will share this with our scheduler.

## 2016-06-11 NOTE — Telephone Encounter (Signed)
CALLED PATIENT TO INFORM OF SURVIVORSHIP APPT. ON 07-20-16 @ 1:30 PM WITH GRETCHEN DAWSON, LVM FOR A RETURN CALL

## 2016-06-18 ENCOUNTER — Other Ambulatory Visit: Payer: Self-pay

## 2016-06-18 DIAGNOSIS — C12 Malignant neoplasm of pyriform sinus: Secondary | ICD-10-CM

## 2016-06-18 DIAGNOSIS — E039 Hypothyroidism, unspecified: Secondary | ICD-10-CM

## 2016-06-18 MED ORDER — FENTANYL 12 MCG/HR TD PT72: MEDICATED_PATCH | TRANSDERMAL | 0 refills | Status: DC

## 2016-06-19 MED FILL — fentaNYL 12 MCG/HR PT72: 12 | 30 days supply | Qty: 10 | Fill #0

## 2016-07-07 ENCOUNTER — Ambulatory Visit (HOSPITAL_BASED_OUTPATIENT_CLINIC_OR_DEPARTMENT_OTHER): Payer: Medicare Other

## 2016-07-07 VITALS — BP 136/86 | HR 76 | Temp 97.5°F | Resp 18

## 2016-07-07 DIAGNOSIS — C12 Malignant neoplasm of pyriform sinus: Secondary | ICD-10-CM | POA: Diagnosis not present

## 2016-07-07 DIAGNOSIS — Z452 Encounter for adjustment and management of vascular access device: Secondary | ICD-10-CM | POA: Diagnosis not present

## 2016-07-07 MED ORDER — SODIUM CHLORIDE 0.9% FLUSH
10.0000 mL | INTRAVENOUS | Status: DC | PRN
  Administered 2016-07-07: 10 mL via INTRAVENOUS
  Filled 2016-07-07: qty 10

## 2016-07-07 MED ORDER — HEPARIN SOD (PORK) LOCK FLUSH 100 UNIT/ML IV SOLN
500.0000 [IU] | Freq: Once | INTRAVENOUS | Status: AC
  Administered 2016-07-07: 500 [IU] via INTRAVENOUS
  Filled 2016-07-07: qty 5

## 2016-07-07 NOTE — Patient Instructions (Signed)

## 2016-07-07 NOTE — Progress Notes (Signed)
Collin Young presented for Portacath access and flush. Proper placement of portacath confirmed by CXR. Portacath located in the right chest wall accessed with  H 20 needle. Clean, Dry and Intact Good blood return present. Portacath flushed with 83ml NS and 500U/48ml Heparin per protocol and needle removed intact. Procedure without incident. Patient tolerated procedure well.

## 2016-07-20 ENCOUNTER — Encounter: Payer: Medicare Other | Admitting: Adult Health

## 2016-07-24 ENCOUNTER — Encounter: Payer: Self-pay | Admitting: Adult Health

## 2016-07-29 ENCOUNTER — Other Ambulatory Visit: Payer: Self-pay | Admitting: *Deleted

## 2016-07-29 DIAGNOSIS — C12 Malignant neoplasm of pyriform sinus: Secondary | ICD-10-CM

## 2016-07-29 DIAGNOSIS — E039 Hypothyroidism, unspecified: Secondary | ICD-10-CM

## 2016-07-29 MED ORDER — FENTANYL 12 MCG/HR TD PT72: MEDICATED_PATCH | TRANSDERMAL | 0 refills | Status: DC

## 2016-07-31 MED FILL — fentaNYL 12 MCG/HR PT72: 12 | 30 days supply | Qty: 10 | Fill #0

## 2016-09-08 ENCOUNTER — Ambulatory Visit: Payer: Medicare Other

## 2016-09-08 ENCOUNTER — Other Ambulatory Visit: Payer: Self-pay | Admitting: *Deleted

## 2016-09-08 DIAGNOSIS — E039 Hypothyroidism, unspecified: Secondary | ICD-10-CM

## 2016-09-08 DIAGNOSIS — C12 Malignant neoplasm of pyriform sinus: Secondary | ICD-10-CM

## 2016-09-08 MED ORDER — FENTANYL 12 MCG/HR TD PT72: MEDICATED_PATCH | TRANSDERMAL | 0 refills | Status: DC

## 2016-09-08 NOTE — Progress Notes (Signed)
1320: Accessed pt port without difficulty. Pt port flushed with 30 cc of NS without difficulty. No blood return noted from port. Pt placed in multiple positions for blood return with no blood return noted. Pt needle re-positioned by Dixie,RN and flushed with no blood return noted. Discussed situation with Judson Roch, NP who stated pt could be heparin locked and sent home. Pt did not want to stay for Tpa. Pt educated on no blood return from port and verbalized understanding. Pt port heparin locked and deaccessed without difficulty. Pt left ambulatory with wife

## 2016-09-08 NOTE — Patient Instructions (Signed)

## 2016-09-09 ENCOUNTER — Other Ambulatory Visit: Payer: Self-pay | Admitting: *Deleted

## 2016-09-09 ENCOUNTER — Ambulatory Visit (HOSPITAL_BASED_OUTPATIENT_CLINIC_OR_DEPARTMENT_OTHER): Payer: Medicare Other

## 2016-09-09 DIAGNOSIS — C12 Malignant neoplasm of pyriform sinus: Secondary | ICD-10-CM

## 2016-09-09 DIAGNOSIS — E039 Hypothyroidism, unspecified: Secondary | ICD-10-CM

## 2016-09-09 DIAGNOSIS — Z452 Encounter for adjustment and management of vascular access device: Secondary | ICD-10-CM

## 2016-09-09 MED ORDER — FENTANYL 12 MCG/HR TD PT72: MEDICATED_PATCH | TRANSDERMAL | 0 refills | Status: DC

## 2016-09-09 MED ORDER — SODIUM CHLORIDE 0.9% FLUSH
10.0000 mL | INTRAVENOUS | Status: DC | PRN
  Administered 2016-09-09: 10 mL via INTRAVENOUS
  Filled 2016-09-09: qty 10

## 2016-09-09 MED ORDER — HEPARIN SOD (PORK) LOCK FLUSH 100 UNIT/ML IV SOLN
500.0000 [IU] | Freq: Once | INTRAVENOUS | Status: AC
  Administered 2016-09-09: 500 [IU] via INTRAVENOUS
  Filled 2016-09-09: qty 5

## 2016-09-09 MED FILL — fentaNYL 12 MCG/HR PT72: 12 | 30 days supply | Qty: 10 | Fill #0

## 2016-09-09 NOTE — Patient Instructions (Addendum)

## 2016-09-09 NOTE — Progress Notes (Signed)
Collin Young presented for Portacath access and flush. Proper placement of portacath confirmed by CXR. Portacath located in the right chest wall accessed with  H 20 needle. Clean, Dry and Intact Good blood return present. Portacath flushed with 7ml NS and 500U/46ml Heparin per protocol and needle removed intact. Procedure without incident. Patient tolerated procedure well.

## 2016-10-27 ENCOUNTER — Other Ambulatory Visit: Payer: Self-pay | Admitting: *Deleted

## 2016-10-27 DIAGNOSIS — E039 Hypothyroidism, unspecified: Secondary | ICD-10-CM

## 2016-10-27 DIAGNOSIS — C12 Malignant neoplasm of pyriform sinus: Secondary | ICD-10-CM

## 2016-10-27 MED ORDER — FENTANYL 12 MCG/HR TD PT72: MEDICATED_PATCH | TRANSDERMAL | 0 refills | Status: DC

## 2016-10-28 MED FILL — fentaNYL 12 MCG/HR PT72: 12 | 30 days supply | Qty: 10 | Fill #0

## 2016-11-03 ENCOUNTER — Ambulatory Visit (HOSPITAL_BASED_OUTPATIENT_CLINIC_OR_DEPARTMENT_OTHER): Payer: Medicare Other

## 2016-11-03 VITALS — BP 152/84 | HR 90 | Temp 97.5°F | Resp 17 | Wt 131.0 lb

## 2016-11-03 DIAGNOSIS — Z452 Encounter for adjustment and management of vascular access device: Secondary | ICD-10-CM | POA: Diagnosis not present

## 2016-11-03 DIAGNOSIS — C12 Malignant neoplasm of pyriform sinus: Secondary | ICD-10-CM

## 2016-11-03 DIAGNOSIS — Z95828 Presence of other vascular implants and grafts: Secondary | ICD-10-CM

## 2016-11-03 MED ORDER — HEPARIN SOD (PORK) LOCK FLUSH 100 UNIT/ML IV SOLN
500.0000 [IU] | Freq: Once | INTRAVENOUS | Status: DC
  Filled 2016-11-03: qty 5

## 2016-11-03 MED ORDER — SODIUM CHLORIDE 0.9% FLUSH
10.0000 mL | INTRAVENOUS | Status: DC | PRN
  Filled 2016-11-03: qty 10

## 2016-12-22 ENCOUNTER — Other Ambulatory Visit: Payer: Self-pay | Admitting: *Deleted

## 2016-12-22 DIAGNOSIS — E039 Hypothyroidism, unspecified: Secondary | ICD-10-CM

## 2016-12-22 DIAGNOSIS — C12 Malignant neoplasm of pyriform sinus: Secondary | ICD-10-CM

## 2016-12-22 MED ORDER — FENTANYL 12 MCG/HR TD PT72: MEDICATED_PATCH | TRANSDERMAL | 0 refills | Status: DC

## 2016-12-23 MED FILL — fentaNYL 12 MCG/HR PT72: 12 | 30 days supply | Qty: 10 | Fill #0

## 2016-12-29 DIAGNOSIS — I255 Ischemic cardiomyopathy: Secondary | ICD-10-CM

## 2016-12-29 HISTORY — DX: Ischemic cardiomyopathy: I25.5

## 2017-01-01 ENCOUNTER — Other Ambulatory Visit: Payer: Self-pay | Admitting: Otolaryngology

## 2017-01-01 DIAGNOSIS — R490 Dysphonia: Secondary | ICD-10-CM

## 2017-01-01 DIAGNOSIS — C12 Malignant neoplasm of pyriform sinus: Secondary | ICD-10-CM

## 2017-01-02 ENCOUNTER — Emergency Department (HOSPITAL_COMMUNITY): Payer: Medicare Other

## 2017-01-02 ENCOUNTER — Inpatient Hospital Stay (HOSPITAL_COMMUNITY)
Admission: EM | Admit: 2017-01-02 | Discharge: 2017-01-17 | DRG: 286 | Disposition: A | Discharge status: Home Health | Payer: Medicare Other | Attending: Family Medicine | Admitting: Family Medicine

## 2017-01-02 ENCOUNTER — Encounter (HOSPITAL_COMMUNITY): Payer: Self-pay | Admitting: Emergency Medicine

## 2017-01-02 DIAGNOSIS — I130 Hypertensive heart and chronic kidney disease with heart failure and stage 1 through stage 4 chronic kidney disease, or unspecified chronic kidney disease: Secondary | ICD-10-CM | POA: Diagnosis present

## 2017-01-02 DIAGNOSIS — R1310 Dysphagia, unspecified: Secondary | ICD-10-CM

## 2017-01-02 DIAGNOSIS — R64 Cachexia: Secondary | ICD-10-CM | POA: Diagnosis present

## 2017-01-02 DIAGNOSIS — B9561 Methicillin susceptible Staphylococcus aureus infection as the cause of diseases classified elsewhere: Secondary | ICD-10-CM | POA: Diagnosis not present

## 2017-01-02 DIAGNOSIS — I208 Other forms of angina pectoris: Secondary | ICD-10-CM | POA: Diagnosis not present

## 2017-01-02 DIAGNOSIS — E43 Unspecified severe protein-calorie malnutrition: Secondary | ICD-10-CM | POA: Diagnosis present

## 2017-01-02 DIAGNOSIS — I5022 Chronic systolic (congestive) heart failure: Secondary | ICD-10-CM | POA: Diagnosis not present

## 2017-01-02 DIAGNOSIS — J690 Pneumonitis due to inhalation of food and vomit: Secondary | ICD-10-CM | POA: Diagnosis present

## 2017-01-02 DIAGNOSIS — Z681 Body mass index (BMI) 19 or less, adult: Secondary | ICD-10-CM | POA: Diagnosis not present

## 2017-01-02 DIAGNOSIS — Z66 Do not resuscitate: Secondary | ICD-10-CM | POA: Diagnosis not present

## 2017-01-02 DIAGNOSIS — J810 Acute pulmonary edema: Secondary | ICD-10-CM

## 2017-01-02 DIAGNOSIS — Z9221 Personal history of antineoplastic chemotherapy: Secondary | ICD-10-CM | POA: Diagnosis not present

## 2017-01-02 DIAGNOSIS — N183 Chronic kidney disease, stage 3 (moderate): Secondary | ICD-10-CM | POA: Diagnosis present

## 2017-01-02 DIAGNOSIS — K209 Esophagitis, unspecified without bleeding: Secondary | ICD-10-CM

## 2017-01-02 DIAGNOSIS — J9601 Acute respiratory failure with hypoxia: Secondary | ICD-10-CM | POA: Diagnosis not present

## 2017-01-02 DIAGNOSIS — I420 Dilated cardiomyopathy: Secondary | ICD-10-CM | POA: Diagnosis present

## 2017-01-02 DIAGNOSIS — J9690 Respiratory failure, unspecified, unspecified whether with hypoxia or hypercapnia: Secondary | ICD-10-CM

## 2017-01-02 DIAGNOSIS — Z85828 Personal history of other malignant neoplasm of skin: Secondary | ICD-10-CM | POA: Diagnosis not present

## 2017-01-02 DIAGNOSIS — Z923 Personal history of irradiation: Secondary | ICD-10-CM

## 2017-01-02 DIAGNOSIS — H9190 Unspecified hearing loss, unspecified ear: Secondary | ICD-10-CM | POA: Diagnosis present

## 2017-01-02 DIAGNOSIS — I25708 Atherosclerosis of coronary artery bypass graft(s), unspecified, with other forms of angina pectoris: Secondary | ICD-10-CM | POA: Diagnosis present

## 2017-01-02 DIAGNOSIS — Z95828 Presence of other vascular implants and grafts: Secondary | ICD-10-CM | POA: Diagnosis not present

## 2017-01-02 DIAGNOSIS — J9621 Acute and chronic respiratory failure with hypoxia: Secondary | ICD-10-CM | POA: Diagnosis present

## 2017-01-02 DIAGNOSIS — Z515 Encounter for palliative care: Secondary | ICD-10-CM | POA: Diagnosis not present

## 2017-01-02 DIAGNOSIS — Y95 Nosocomial condition: Secondary | ICD-10-CM | POA: Diagnosis not present

## 2017-01-02 DIAGNOSIS — K208 Other esophagitis without bleeding: Secondary | ICD-10-CM

## 2017-01-02 DIAGNOSIS — I5023 Acute on chronic systolic (congestive) heart failure: Secondary | ICD-10-CM | POA: Diagnosis present

## 2017-01-02 DIAGNOSIS — I340 Nonrheumatic mitral (valve) insufficiency: Secondary | ICD-10-CM | POA: Diagnosis not present

## 2017-01-02 DIAGNOSIS — I351 Nonrheumatic aortic (valve) insufficiency: Secondary | ICD-10-CM | POA: Diagnosis present

## 2017-01-02 DIAGNOSIS — N400 Enlarged prostate without lower urinary tract symptoms: Secondary | ICD-10-CM | POA: Diagnosis present

## 2017-01-02 DIAGNOSIS — Z978 Presence of other specified devices: Secondary | ICD-10-CM | POA: Diagnosis not present

## 2017-01-02 DIAGNOSIS — I10 Essential (primary) hypertension: Secondary | ICD-10-CM | POA: Diagnosis not present

## 2017-01-02 DIAGNOSIS — R627 Adult failure to thrive: Secondary | ICD-10-CM | POA: Diagnosis present

## 2017-01-02 DIAGNOSIS — R1312 Dysphagia, oropharyngeal phase: Secondary | ICD-10-CM | POA: Diagnosis present

## 2017-01-02 DIAGNOSIS — I209 Angina pectoris, unspecified: Secondary | ICD-10-CM | POA: Diagnosis not present

## 2017-01-02 DIAGNOSIS — Z8501 Personal history of malignant neoplasm of esophagus: Secondary | ICD-10-CM

## 2017-01-02 DIAGNOSIS — I25118 Atherosclerotic heart disease of native coronary artery with other forms of angina pectoris: Secondary | ICD-10-CM | POA: Diagnosis present

## 2017-01-02 DIAGNOSIS — R918 Other nonspecific abnormal finding of lung field: Secondary | ICD-10-CM

## 2017-01-02 DIAGNOSIS — K210 Gastro-esophageal reflux disease with esophagitis: Secondary | ICD-10-CM | POA: Diagnosis present

## 2017-01-02 DIAGNOSIS — I5043 Acute on chronic combined systolic (congestive) and diastolic (congestive) heart failure: Secondary | ICD-10-CM | POA: Diagnosis present

## 2017-01-02 DIAGNOSIS — Z9981 Dependence on supplemental oxygen: Secondary | ICD-10-CM | POA: Diagnosis not present

## 2017-01-02 DIAGNOSIS — Z87891 Personal history of nicotine dependence: Secondary | ICD-10-CM

## 2017-01-02 DIAGNOSIS — N179 Acute kidney failure, unspecified: Secondary | ICD-10-CM | POA: Diagnosis present

## 2017-01-02 DIAGNOSIS — J449 Chronic obstructive pulmonary disease, unspecified: Secondary | ICD-10-CM | POA: Diagnosis present

## 2017-01-02 DIAGNOSIS — I509 Heart failure, unspecified: Secondary | ICD-10-CM | POA: Diagnosis not present

## 2017-01-02 DIAGNOSIS — E785 Hyperlipidemia, unspecified: Secondary | ICD-10-CM | POA: Diagnosis present

## 2017-01-02 DIAGNOSIS — Z79899 Other long term (current) drug therapy: Secondary | ICD-10-CM

## 2017-01-02 DIAGNOSIS — J9600 Acute respiratory failure, unspecified whether with hypoxia or hypercapnia: Secondary | ICD-10-CM

## 2017-01-02 DIAGNOSIS — I272 Pulmonary hypertension, unspecified: Secondary | ICD-10-CM | POA: Diagnosis present

## 2017-01-02 DIAGNOSIS — E871 Hypo-osmolality and hyponatremia: Secondary | ICD-10-CM | POA: Diagnosis not present

## 2017-01-02 DIAGNOSIS — I255 Ischemic cardiomyopathy: Secondary | ICD-10-CM | POA: Diagnosis present

## 2017-01-02 DIAGNOSIS — I429 Cardiomyopathy, unspecified: Secondary | ICD-10-CM | POA: Diagnosis not present

## 2017-01-02 DIAGNOSIS — C12 Malignant neoplasm of pyriform sinus: Secondary | ICD-10-CM | POA: Diagnosis present

## 2017-01-02 DIAGNOSIS — R0602 Shortness of breath: Secondary | ICD-10-CM | POA: Diagnosis present

## 2017-01-02 DIAGNOSIS — B370 Candidal stomatitis: Secondary | ICD-10-CM | POA: Diagnosis not present

## 2017-01-02 DIAGNOSIS — R7881 Bacteremia: Secondary | ICD-10-CM | POA: Diagnosis not present

## 2017-01-02 DIAGNOSIS — Z22321 Carrier or suspected carrier of Methicillin susceptible Staphylococcus aureus: Secondary | ICD-10-CM

## 2017-01-02 DIAGNOSIS — E7800 Pure hypercholesterolemia, unspecified: Secondary | ICD-10-CM | POA: Diagnosis not present

## 2017-01-02 DIAGNOSIS — B379 Candidiasis, unspecified: Secondary | ICD-10-CM | POA: Diagnosis not present

## 2017-01-02 DIAGNOSIS — I5041 Acute combined systolic (congestive) and diastolic (congestive) heart failure: Secondary | ICD-10-CM | POA: Diagnosis not present

## 2017-01-02 DIAGNOSIS — I5021 Acute systolic (congestive) heart failure: Secondary | ICD-10-CM | POA: Diagnosis not present

## 2017-01-02 DIAGNOSIS — T502X5A Adverse effect of carbonic-anhydrase inhibitors, benzothiadiazides and other diuretics, initial encounter: Secondary | ICD-10-CM | POA: Diagnosis not present

## 2017-01-02 DIAGNOSIS — Z789 Other specified health status: Secondary | ICD-10-CM | POA: Diagnosis not present

## 2017-01-02 DIAGNOSIS — R570 Cardiogenic shock: Secondary | ICD-10-CM | POA: Diagnosis not present

## 2017-01-02 DIAGNOSIS — I2510 Atherosclerotic heart disease of native coronary artery without angina pectoris: Secondary | ICD-10-CM | POA: Diagnosis not present

## 2017-01-02 DIAGNOSIS — R1319 Other dysphagia: Secondary | ICD-10-CM

## 2017-01-02 DIAGNOSIS — Z09 Encounter for follow-up examination after completed treatment for conditions other than malignant neoplasm: Secondary | ICD-10-CM

## 2017-01-02 DIAGNOSIS — J189 Pneumonia, unspecified organism: Secondary | ICD-10-CM | POA: Diagnosis not present

## 2017-01-02 DIAGNOSIS — C159 Malignant neoplasm of esophagus, unspecified: Secondary | ICD-10-CM | POA: Diagnosis not present

## 2017-01-02 DIAGNOSIS — Z4659 Encounter for fitting and adjustment of other gastrointestinal appliance and device: Secondary | ICD-10-CM

## 2017-01-02 LAB — CBC WITH DIFFERENTIAL/PLATELET
BASOS PCT: 0 %
Basophils Absolute: 0 10*3/uL (ref 0.0–0.1)
EOS ABS: 0 10*3/uL (ref 0.0–0.7)
EOS PCT: 0 %
HCT: 39.3 % (ref 39.0–52.0)
HEMOGLOBIN: 13.1 g/dL (ref 13.0–17.0)
LYMPHS ABS: 0.6 10*3/uL — AB (ref 0.7–4.0)
Lymphocytes Relative: 19 %
MCH: 29 pg (ref 26.0–34.0)
MCHC: 33.3 g/dL (ref 30.0–36.0)
MCV: 86.9 fL (ref 78.0–100.0)
MONOS PCT: 11 %
Monocytes Absolute: 0.3 10*3/uL (ref 0.1–1.0)
NEUTROS PCT: 70 %
Neutro Abs: 2.1 10*3/uL (ref 1.7–7.7)
PLATELETS: 192 10*3/uL (ref 150–400)
RBC: 4.52 MIL/uL (ref 4.22–5.81)
RDW: 14.3 % (ref 11.5–15.5)
WBC: 3.1 10*3/uL — ABNORMAL LOW (ref 4.0–10.5)

## 2017-01-02 LAB — COMPREHENSIVE METABOLIC PANEL
ALK PHOS: 37 U/L — AB (ref 38–126)
ALT: 25 U/L (ref 17–63)
AST: 39 U/L (ref 15–41)
Albumin: 3.9 g/dL (ref 3.5–5.0)
Anion gap: 14 (ref 5–15)
BUN: 29 mg/dL — AB (ref 6–20)
CALCIUM: 9.1 mg/dL (ref 8.9–10.3)
CO2: 20 mmol/L — AB (ref 22–32)
CREATININE: 1.54 mg/dL — AB (ref 0.61–1.24)
Chloride: 101 mmol/L (ref 101–111)
GFR calc non Af Amer: 45 mL/min — ABNORMAL LOW (ref >60)
GFR, EST AFRICAN AMERICAN: 53 mL/min — AB (ref >60)
Glucose, Bld: 168 mg/dL — ABNORMAL HIGH (ref 65–99)
Potassium: 4.2 mmol/L (ref 3.5–5.1)
SODIUM: 135 mmol/L (ref 135–145)
Total Bilirubin: 0.4 mg/dL (ref 0.3–1.2)
Total Protein: 7.1 g/dL (ref 6.5–8.1)

## 2017-01-02 LAB — I-STAT TROPONIN, ED: TROPONIN I, POC: 0.04 ng/mL (ref 0.00–0.08)

## 2017-01-02 LAB — BRAIN NATRIURETIC PEPTIDE: B NATRIURETIC PEPTIDE 5: 1999.4 pg/mL — AB (ref 0.0–100.0)

## 2017-01-02 MED ORDER — CARVEDILOL 3.125 MG PO TABS
3.1250 mg | ORAL_TABLET | Freq: Two times a day (BID) | ORAL | Status: DC
  Administered 2017-01-02 – 2017-01-03 (×3): 3.125 mg via ORAL
  Filled 2017-01-02 (×3): qty 1

## 2017-01-02 MED ORDER — POLYETHYLENE GLYCOL 3350 17 G PO PACK
17.0000 g | PACK | Freq: Every day | ORAL | Status: DC | PRN
  Administered 2017-01-02 – 2017-01-04 (×2): 17 g via ORAL
  Filled 2017-01-02 (×2): qty 1

## 2017-01-02 MED ORDER — FUROSEMIDE 10 MG/ML IJ SOLN
40.0000 mg | INTRAMUSCULAR | Status: AC
  Administered 2017-01-02: 40 mg via INTRAVENOUS
  Filled 2017-01-02: qty 4

## 2017-01-02 MED ORDER — ATORVASTATIN CALCIUM 20 MG PO TABS
20.0000 mg | ORAL_TABLET | Freq: Every day | ORAL | Status: DC
  Administered 2017-01-02 – 2017-01-15 (×12): 20 mg via ORAL
  Filled 2017-01-02 (×14): qty 1

## 2017-01-02 MED ORDER — ACETAMINOPHEN 325 MG PO TABS
650.0000 mg | ORAL_TABLET | ORAL | Status: DC | PRN
  Administered 2017-01-03 – 2017-01-11 (×7): 650 mg via ORAL
  Filled 2017-01-02 (×10): qty 2

## 2017-01-02 MED ORDER — SODIUM CHLORIDE 0.9 % IV SOLN: 250.0000 mL | INTRAVENOUS | Status: DC | PRN

## 2017-01-02 MED ORDER — FENTANYL 12 MCG/HR TD PT72
12.5000 ug | MEDICATED_PATCH | TRANSDERMAL | Status: DC
  Administered 2017-01-02 – 2017-01-05 (×2): 12.5 ug via TRANSDERMAL
  Filled 2017-01-02 (×2): qty 1

## 2017-01-02 MED ORDER — SODIUM CHLORIDE 0.9% FLUSH: 3.0000 mL | INTRAVENOUS | Status: DC | PRN

## 2017-01-02 MED ORDER — ENOXAPARIN SODIUM 30 MG/0.3ML ~~LOC~~ SOLN
30.0000 mg | SUBCUTANEOUS | Status: DC
  Administered 2017-01-02 – 2017-01-04 (×3): 30 mg via SUBCUTANEOUS
  Filled 2017-01-02 (×3): qty 0.3

## 2017-01-02 MED ORDER — ALBUTEROL SULFATE (2.5 MG/3ML) 0.083% IN NEBU
5.0000 mg | INHALATION_SOLUTION | Freq: Once | RESPIRATORY_TRACT | Status: AC
  Administered 2017-01-02: 5 mg via RESPIRATORY_TRACT
  Filled 2017-01-02: qty 6

## 2017-01-02 MED ORDER — CARVEDILOL 3.125 MG PO TABS: 3.1250 mg | ORAL_TABLET | Freq: Two times a day (BID) | ORAL | Status: DC

## 2017-01-02 MED ORDER — FUROSEMIDE 10 MG/ML IJ SOLN
40.0000 mg | Freq: Two times a day (BID) | INTRAMUSCULAR | Status: DC
  Administered 2017-01-02 – 2017-01-03 (×2): 40 mg via INTRAVENOUS
  Filled 2017-01-02 (×3): qty 4

## 2017-01-02 MED ORDER — SODIUM CHLORIDE 0.9% FLUSH
3.0000 mL | Freq: Two times a day (BID) | INTRAVENOUS | Status: DC
  Administered 2017-01-02 – 2017-01-07 (×6): 3 mL via INTRAVENOUS

## 2017-01-02 MED ORDER — ADULT MULTIVITAMIN W/MINERALS CH
1.0000 | ORAL_TABLET | Freq: Every day | ORAL | Status: DC
  Administered 2017-01-02 – 2017-01-15 (×12): 1 via ORAL
  Filled 2017-01-02 (×14): qty 1

## 2017-01-02 MED ORDER — ONDANSETRON HCL 4 MG/2ML IJ SOLN
4.0000 mg | Freq: Four times a day (QID) | INTRAMUSCULAR | Status: DC | PRN
  Administered 2017-01-03 – 2017-01-05 (×2): 4 mg via INTRAVENOUS
  Filled 2017-01-02 (×2): qty 2

## 2017-01-02 NOTE — ED Notes (Signed)
Bed: EK35 Expected date:  Expected time:  Means of arrival:  Comments: Short of breath

## 2017-01-02 NOTE — H&P (Signed)
History and Physical    Collin Young YIR:[id number] DOB: 10-02-1950 DOA: 01/02/2017  I have briefly reviewed the patient's prior medical records in [place]  PCP: [name], MD  Patient coming from: Home  Chief Complaint: Shortness of breath  HPI: Collin Young is a 66 y.o. 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 Marin Olp and Dr. Redmond Baseman with ENT, presents to the emergency room with chief complaint of shortness of breath.  He has been feeling short of breath over the last several weeks, however in the last day he has been unable to even get around his home because he gets extremely short of breath every time he tries to walk.  He has never had this problem in the past.  He denies any swelling in his legs.  He denies any fever or chills.  He has been having a cough over the last week, no sputum production.  He denies any chest pain now or any episodes of chest pain in the last several months.  He denies any weight gain in fact he has lost some weight in the last several years.  He has no abdominal pain, nausea or vomiting.  ED Course: In the emergency room patient vital signs are stable, he is mildly tachycardic into the low 100s, his blood pressure is on the high side, he is blood work is remarkable for mild elevation of the creatinine to 1.5, his BNP was significantly elevated at 2000.  Troponin is negative.  His EKG shows sinus rhythm and LVH.  Chest x-ray pertinent for evidence of pulmonary edema and small pleural effusions.  He was given a dose of IV Lasix, and TRH was asked for admission for new onset CHF.  Review of Systems: As per HPI otherwise 10 point review of systems negative.   Past Medical History:  Diagnosis Date  . Cancer [facility])    Esophagus-  radiation 2011  . GERD (gastroesophageal reflux disease)    takes zantac prn  . Headache   . History of radiation therapy 10/19/2013-12/05/2013   pyriform sinus 69.96 Gy/56fx  . HOH (hard of hearing)   . Hyperlipemia   . Hypertension   . Seizures ([place])    alcohol withdrawls- 2001    Past Surgical History:  Procedure Laterality Date  . CARDIOVASCULAR STRESS TEST  10/09/09   normal nuclearr stress test, EF 57% [name])  . LARYNGOSCOPY N/A 09/15/2013   Procedure: LARYNGOSCOPY;  Surgeon: [name], MD;  Location: [place];  Service: ENT;  Laterality: N/A;  direct laryngoscopy with biopsy and esophagoscopy  . NO PAST SURGERIES       reports that he quit smoking about 17 years ago. His smoking use included Cigarettes. He started smoking about 56 years ago. He has a 40.00 pack-year smoking history. He quit smokeless tobacco use about 17 years ago. His smokeless tobacco use included Chew. He reports that he does not drink alcohol or use drugs.  No Known Allergies  Family history significant for breast cancer in his mother, and other cancers that he is not completely sure.  Multiple other family members  Prior to Admission medications   Medication Sig Start Date End Date Taking? Authorizing Provider  atorvastatin (LIPITOR) 20 MG tablet Take 20 mg by mouth daily. 12/30/16  Yes [provider]  fentaNYL (DURAGESIC - DOSED MCG/HR) 12 MCG/HR Place 1 patch every 3 days to the skin 12/22/16  Yes Ennever, Rudell Cobb, MD  Multiple Vitamin (MULTIVITAMIN WITH MINERALS) TABS tablet Take 1 tablet by mouth daily.   Yes [provider]    Physical Exam: Vitals:   01/02/17 3419 01/02/17 0615 01/02/17 0630 01/02/17 0730  BP:   (!) 162/104 (!) 156/95  Pulse:  97  99  Resp:  (!) 25  (!) 24  Temp:      TempSrc:      SpO2:  100%  95%  Weight: 54.4 kg (120 lb)     Height: 5\' 6"  (1.676 m)      Constitutional: NAD, calm, comfortable, HOH Eyes:  lids and conjunctivae normal ENMT: Mucous  membranes are moist Neck: normal, supple Respiratory: Crackles throughout mid lung fields, no wheezing Cardiovascular: Regular rate and rhythm, no murmurs / rubs / gallops. No extremity edema.+JVD. 2+ pedal pulses.  Abdomen: no tenderness, no masses palpated. Bowel sounds positive.  Musculoskeletal: no clubbing / cyanosis.  Normal muscle tone.  Skin: no rashes, lesions, ulcers. No induration Neurologic: CN 2-12 grossly intact. Strength 5/5 in all 4.  Psychiatric: Normal judgment and insight. Alert and oriented x 3. Normal mood.   Labs on Admission: I have personally reviewed following labs and imaging studies  CBC:  Recent Labs Lab 01/02/17 0658  WBC 3.1*  NEUTROABS 2.1  HGB 13.1  HCT 39.3  MCV 86.9  PLT 622   Basic Metabolic Panel:  Recent Labs Lab 01/02/17 0658  NA 135  K 4.2  CL 101  CO2 20*  GLUCOSE 168*  BUN 29*  CREATININE 1.54*  CALCIUM 9.1   GFR: Estimated Creatinine Clearance: 36.3 mL/min (A) (by C-G formula based on SCr of 1.54 mg/dL (H)). Liver Function Tests:  Recent Labs Lab 01/02/17 0658  AST 39  ALT 25  ALKPHOS 37*  BILITOT 0.4  PROT 7.1  ALBUMIN 3.9   No results for input(s): LIPASE, AMYLASE in the last 168 hours. No results for input(s): AMMONIA in the last 168 hours. Coagulation Profile: No results for input(s): INR, PROTIME in the last 168 hours. Cardiac Enzymes: No results for input(s): CKTOTAL, CKMB, CKMBINDEX, TROPONINI in the last 168 hours. BNP (last 3 results) No results for input(s): PROBNP in the last 8760 hours. HbA1C: No results for input(s): HGBA1C in the last 72 hours. CBG: No results for input(s): GLUCAP in the last 168 hours. Lipid Profile: No results for input(s): CHOL, HDL, LDLCALC, TRIG, CHOLHDL, LDLDIRECT in the last 72 hours. Thyroid Function Tests: No results for input(s): TSH, T4TOTAL, FREET4, T3FREE, THYROIDAB in the last 72 hours. Anemia Panel: No results for input(s): VITAMINB12, FOLATE, FERRITIN, TIBC,  IRON, RETICCTPCT in the last 72 hours. Urine analysis:    Component Value Date/Time   COLORURINE YELLOW 08/04/2014 2007   APPEARANCEUR CLEAR 08/04/2014 2007   LABSPEC 1.008 08/04/2014 2007   PHURINE 6.0 08/04/2014 2007   GLUCOSEU NEGATIVE 08/04/2014 2007   HGBUR NEGATIVE 08/04/2014 2007   BILIRUBINUR NEGATIVE 08/04/2014 2007   KETONESUR NEGATIVE 08/04/2014 2007   PROTEINUR NEGATIVE 08/04/2014 2007   UROBILINOGEN 1.0 08/04/2014 2007   NITRITE NEGATIVE 08/04/2014 2007   LEUKOCYTESUR TRACE (A) 08/04/2014 2007     Radiological Exams on Admission: Dg Chest 2 View  Result Date: 01/02/2017 CLINICAL DATA:  Dyspnea, onset yesterday and worsening. EXAM: CHEST  2 VIEW COMPARISON:  10/09/2015 FINDINGS: Right-sided port extends into the low SVC. There are pleural effusions  bilaterally, new. There is mild interstitial fluid and vascular prominence. Central and basilar airspace opacities are present. IMPRESSION: Interstitial and alveolar edema. New small pleural effusions. Cannot exclude infectious infiltrates. Electronically Signed   By: Andreas Newport M.D.   On: 01/02/2017 06:39    EKG: Independently reviewed.  Sinus rhythm, LVH  Assessment/Plan Active Problems:   Malignant neoplasm of pyriform sinus (HCC)   Acute CHF (HCC)   AKI (acute kidney injury) ([place])   Acute CHF -Patient has no lower extremity edema, however has crackles on exam, pulmonary edema on chest x-ray and JVD. -Admit on heart failure pathway, obtain a 2D echo, IV Lasix with 40 mg twice a day, strict I's and O's, daily weights -Hold initiation of ACE inhibitors until it is clear what his kidneys are going to do on the diuresis, start Coreg  Hypertension -He is not on any medications at home however appears hypertensive in the emergency room with blood pressures into the 160s, start Coreg.  Acute kidney injury -Closely monitor renal function while diuresing.  Not entirely sure about baseline creatinine and whether he has  a degree of chronic kidney disease, as he has had elevations of creatinine in the past (2016), however has been stable in the last year  Squamous cell carcinoma of the left piriform sinus -In remission, outpatient management -Continue home fentanyl   DVT prophylaxis: Lovenox Code Status: Full code Family Communication: Extended family at bedside Disposition Plan: Admit to telemetry, expect home when ready Consults called: None  Admission status: Inpatient  At the time of admission, it appears that the appropriate admission status for this patient is INPATIENT. This is judged to be reasonable and necessary in order to provide the required high service intensity to ensure the patient's safety given the presenting symptoms, physical exam findings, and initial radiographic and laboratory data in the context of their chronic comorbidities. Current circumstances are acute CHF, significant dyspnea, kidney injury, and it is felt to place patient at high risk for further clinical deterioration threatening life, limb, or organ. Moreover, it is my clinical judgment that the patient will require inpatient hospital care spanning beyond 2 midnights from the point of admission and that early discharge would result in unnecessary risk of decompensation and readmission or threat to life, limb or bodily function.   Marzetta Board, MD [facility] Pager [phone]  If 7PM-7AM, please contact night-coverage [email or web address] Password [email or web address]  01/02/2017, 9:15 AM

## 2017-01-02 NOTE — ED Triage Notes (Signed)
Brought in by EMS from home with c/o shortness of breath, onset yesterday.  Pt reports persistent shortness of breath that gets worse with minimal activity like simple walking.  No hx of asthma or COPD but esophageal cancer.

## 2017-01-02 NOTE — ED Provider Notes (Signed)
[facility] DEPT Provider Note   CSN: [id number] Arrival date & time: 01/02/17  0604     History   Chief Complaint Chief Complaint  Patient presents with  . Shortness of Breath    HPI Collin Young is a 66 y.o. male.  66 year old male with past medical history including esophageal cancer, hypertension, hyperlipidemia who presents with shortness of breath. The patient states that recently he has had gradually worsening shortness of breath that has gotten worse since yesterday. The shortness of breath is worse with any exertion. He has a chronic cough but denies any change in his cough. No associated chest pain, fevers, vomiting, or diarrhea. He denies any leg swelling or pain.    Shortness of Breath     Past Medical History:  Diagnosis Date  . Cancer [facility])    Esophagus- radiation 2011  . GERD (gastroesophageal reflux disease)    takes zantac prn  . Headache   . History of radiation therapy 10/19/2013-12/05/2013   pyriform sinus 69.96 Gy/65fx  . HOH (hard of hearing)   . Hyperlipemia   . Hypertension   . Seizures ([place])    alcohol withdrawls- 2001    Patient Active Problem List   Diagnosis Date Noted  . Malignant neoplasm of pyriform sinus ([place]) 09/28/2013  . 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 NONSPECIFIC ABN FINDING RAD & OTH EXAM GI TRACT 05/14/2009    Past Surgical History:  Procedure Laterality Date  . CARDIOVASCULAR STRESS TEST  10/09/09   normal nuclearr stress test, EF 57% [name])  . LARYNGOSCOPY N/A 09/15/2013   Procedure: LARYNGOSCOPY;  Surgeon: [name], MD;  Location: [place];  Service: ENT;  Laterality: N/A;  direct laryngoscopy with biopsy and esophagoscopy  . NO PAST SURGERIES         Home Medications    Prior to Admission medications   Medication Sig Start Date End Date Taking? Authorizing Provider  atorvastatin (LIPITOR) 20 MG tablet Take 20 mg by mouth daily. 12/30/16  Yes [provider]  fentaNYL (DURAGESIC - DOSED MCG/HR)  12 MCG/HR Place 1 patch every 3 days to the skin 12/22/16  Yes Ennever, Rudell Cobb, MD  Multiple Vitamin (MULTIVITAMIN WITH MINERALS) TABS tablet Take 1 tablet by mouth daily.   Yes [provider]    Family History History reviewed. No pertinent family history.  Social History Social History  Substance Use Topics  . Smoking status: Former Smoker    Packs/day: 1.00    Years: 40.00    Types: Cigarettes    Start date: 11/08/1960    Quit date: 09/01/1999  . Smokeless tobacco: Former Systems developer    Types: [place] date: 09/01/1999     Comment: quit in 2011  . Alcohol use No     Comment: quit in 2001     Allergies   Patient has no known allergies.   Review of Systems Review of Systems  Respiratory: Positive for shortness of breath.    All other systems reviewed and are negative except that which was mentioned in HPI  Physical Exam Updated Vital Signs BP (!) 156/95   Pulse 99   Temp 97.4 F (36.3 C) (Oral)   Resp (!) 24   Ht 5\' 6"  (1.676 m)   Wt 120 lb (54.4 kg)   SpO2 95%   BMI 19.37 kg/m   Physical Exam  Constitutional: He is oriented to person, place, and time. He appears well-developed. No distress.  Frail, elderly man  awake and comfortable  HENT:  Head: Normocephalic and atraumatic.  High-pitched, raspy voice, Moist mucous membranes  Eyes: Conjunctivae are normal. Pupils are equal, round, and reactive to light.  Neck: Neck supple.  Cardiovascular: Regular rhythm and normal heart sounds.   No murmur heard. Mild tachycardia  Pulmonary/Chest:  Rhonchi b/l upper fields with crackles and diminished BS b/l lower fields, expiratory wheezes; Port in R upper chest  Abdominal: Soft. Bowel sounds are normal. He exhibits no distension. There is no tenderness.  Musculoskeletal: He exhibits no edema.  Neurological: He is alert and oriented to person, place, and time.  Fluent speech  Skin: Skin is warm and dry.  Fentanyl patch upper back  Psychiatric: He has a normal  mood and affect. Judgment normal.  Nursing note and vitals reviewed.    ED Treatments / Results  Labs (all labs ordered are listed, but only abnormal results are displayed) Labs Reviewed  COMPREHENSIVE METABOLIC PANEL - Abnormal; Notable for the following:       Result Value   CO2 20 (*)    Glucose, Bld 168 (*)    BUN 29 (*)    Creatinine, Ser 1.54 (*)    Alkaline Phosphatase 37 (*)    GFR calc non Af Amer 45 (*)    GFR calc Af Amer 53 (*)    All other components within normal limits  CBC WITH DIFFERENTIAL/PLATELET - Abnormal; Notable for the following:    WBC 3.1 (*)    Lymphs Abs 0.6 (*)    All other components within normal limits  BRAIN NATRIURETIC PEPTIDE - Abnormal; Notable for the following:    B Natriuretic Peptide 1,999.4 (*)    All other components within normal limits  I-STAT TROPOININ, ED    EKG  EKG Interpretation  Date/Time:  Saturday Jan 02 2017 06:09:39 EDT Ventricular Rate:  105 PR Interval:    QRS Duration: 106 QT Interval:  375 QTC Calculation: 496 R Axis:   74 Text Interpretation:  Sinus tachycardia Probable left ventricular hypertrophy Anterior Q waves, possibly due to LVH Confirmed by Ripley Fraise [phone]) on 01/02/2017 6:12:28 AM       Radiology Dg Chest 2 View  Result Date: 01/02/2017 CLINICAL DATA:  Dyspnea, onset yesterday and worsening. EXAM: CHEST  2 VIEW COMPARISON:  10/09/2015 FINDINGS: Right-sided port extends into the low SVC. There are pleural effusions bilaterally, new. There is mild interstitial fluid and vascular prominence. Central and basilar airspace opacities are present. IMPRESSION: Interstitial and alveolar edema. New small pleural effusions. Cannot exclude infectious infiltrates. Electronically Signed   By: Andreas Newport M.D.   On: 01/02/2017 06:39    Procedures Procedures (including critical care time)  Medications Ordered in ED Medications  albuterol (PROVENTIL) (2.5 MG/3ML) 0.083% nebulizer solution 5 mg (5 mg  Nebulization Given 01/02/17 0640)  furosemide (LASIX) injection 40 mg (40 mg Intravenous Given 01/02/17 0836)     Initial Impression / Assessment and Plan / ED Course  I have reviewed the triage vital signs and the nursing notes.  Pertinent labs & imaging results that were available during my care of the patient were reviewed by me and considered in my medical decision making (see chart for details).     Pt w/ h/o esophageal CA p/w gradually worsening shortness of breath on exertion. He was nontoxic on exam, vital signs notable for BP 162/104, borderline tachycardia, afebrile, O2 saturation 100% on room air. He had normal work of breathing on exam, rhonchi bilaterally with crackles  in bases. Chest x-ray shows edema with new small lateral effusions.   Labs show AKI Cr 1.5, BNP 2000 c/w acute CHF. Pt with no h/o heart failure. His vital signs remained stable but given his dyspnea with any exertion and kidney injury, discussed admission with hospitalist, Dr. Renne Crigler, for diuresis and workup of his new heart failure. Pt admitted for further care.  Final Clinical Impressions(s) / ED Diagnoses   Final diagnoses:  Acute congestive heart failure, unspecified heart failure type ([place])  Acute pulmonary edema (HCC)  AKI (acute kidney injury) [facility])    New Prescriptions New Prescriptions   No medications on file     Harlene Petralia, Wenda Overland, MD 01/02/17 [phone]

## 2017-01-03 ENCOUNTER — Inpatient Hospital Stay (HOSPITAL_COMMUNITY): Payer: Medicare Other

## 2017-01-03 DIAGNOSIS — I340 Nonrheumatic mitral (valve) insufficiency: Secondary | ICD-10-CM

## 2017-01-03 DIAGNOSIS — I509 Heart failure, unspecified: Secondary | ICD-10-CM

## 2017-01-03 DIAGNOSIS — N179 Acute kidney failure, unspecified: Secondary | ICD-10-CM

## 2017-01-03 LAB — BASIC METABOLIC PANEL
Anion gap: 11 (ref 5–15)
BUN: 33 mg/dL — ABNORMAL HIGH (ref 6–20)
CALCIUM: 9.6 mg/dL (ref 8.9–10.3)
CO2: 29 mmol/L (ref 22–32)
CREATININE: 1.7 mg/dL — AB (ref 0.61–1.24)
Chloride: 96 mmol/L — ABNORMAL LOW (ref 101–111)
GFR calc non Af Amer: 40 mL/min — ABNORMAL LOW (ref >60)
GFR, EST AFRICAN AMERICAN: 47 mL/min — AB (ref >60)
Glucose, Bld: 118 mg/dL — ABNORMAL HIGH (ref 65–99)
Potassium: 4.4 mmol/L (ref 3.5–5.1)
Sodium: 136 mmol/L (ref 135–145)

## 2017-01-03 LAB — ECHOCARDIOGRAM COMPLETE
Height: 66 in
Weight: 1880 oz

## 2017-01-03 MED ORDER — SODIUM CHLORIDE 0.9% FLUSH: 3.0000 mL | INTRAVENOUS | Status: DC | PRN

## 2017-01-03 MED ORDER — SODIUM CHLORIDE 0.9% FLUSH
3.0000 mL | Freq: Two times a day (BID) | INTRAVENOUS | Status: DC
  Administered 2017-01-03 – 2017-01-04 (×3): 3 mL via INTRAVENOUS

## 2017-01-03 MED ORDER — SODIUM CHLORIDE 0.9 % IV SOLN: 250.0000 mL | INTRAVENOUS | Status: DC | PRN

## 2017-01-03 MED ORDER — CARVEDILOL 6.25 MG PO TABS
6.2500 mg | ORAL_TABLET | Freq: Two times a day (BID) | ORAL | Status: DC
  Administered 2017-01-03 – 2017-01-12 (×18): 6.25 mg via ORAL
  Filled 2017-01-03 (×19): qty 1

## 2017-01-03 MED ORDER — ASPIRIN 81 MG PO CHEW
81.0000 mg | CHEWABLE_TABLET | ORAL | Status: AC
  Administered 2017-01-04: 81 mg via ORAL
  Filled 2017-01-03: qty 1

## 2017-01-03 MED ORDER — SPIRONOLACTONE 25 MG PO TABS
12.5000 mg | ORAL_TABLET | Freq: Every day | ORAL | Status: DC
  Filled 2017-01-03: qty 1

## 2017-01-03 MED ORDER — SODIUM CHLORIDE 0.9 % IV SOLN
INTRAVENOUS | Status: DC
  Administered 2017-01-04: 05:00:00 via INTRAVENOUS

## 2017-01-03 NOTE — Consult Note (Addendum)
Patient ID: Collin Young MRN: [id number] DOB/AGE: 1951/02/15 66 y.o.  Admit date: 01/02/2017 Primary Physician [name], MD Primary Cardiologist new [facility])   Chief Complaint  shortness of breath  HPI: 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  [name] reports an episode of acute shortness of breath that started last night.  He endorsed orthopnea and PND.  He reports a couple episodes of sharp left sided chest pain at rest.  He hasn't noted lower extremity edema or weight gain.  He denies fever or recent URI.  He decided to go to the ED because he was unable to catch his breath.  In the ED he was tachycardic to the low 100s and his blood pressure was elevated. BNP was elevated at 2000 and cardiac enzymes were negative. He had an echocardiogram this admission that revealed LVEF 10-15% with diffuse hypokinesis and moderate aortic regurgitation. Cardiology was consulted by Dr. Marzetta Board.  Of note, Mr. Marline Backbone had a Lexiscan Myoview 10/09/09 that revealed LVEF 57% and no ischemia.  [name] previously underwent chemotherapy (cisplatin) and radiation which he completed 11/2013.  Review of Systems:   A 12 point review of systems was obtained and was negative with pertinent positives noted in the history of present illness.  Past Medical History:  Diagnosis Date  . Cancer [facility])    Esophagus- radiation 2011  . GERD (gastroesophageal reflux disease)    takes zantac prn  . Headache   . History of radiation therapy 10/19/2013-12/05/2013   pyriform sinus 69.96 Gy/4fx  . HOH (hard of hearing)   . Hyperlipemia   . Hypertension   . Seizures ([place])    alcohol withdrawls- 2001    Medications Prior to Admission  Medication Sig Dispense Refill  . atorvastatin (LIPITOR) 20 MG tablet Take 20 mg by mouth daily.     . fentaNYL (DURAGESIC - DOSED MCG/HR) 12 MCG/HR Place 1 patch every 3 days to the skin 10 patch 0  . Multiple Vitamin (MULTIVITAMIN WITH MINERALS) TABS tablet Take 1 tablet by mouth daily.       Marland Kitchen atorvastatin  20 mg Oral Daily  . carvedilol  6.25 mg Oral BID WC  . enoxaparin (LOVENOX) injection  30 mg Subcutaneous Q24H  . fentaNYL  12.5 mcg Transdermal Q72H  . furosemide  40 mg Intravenous Q12H  . multivitamin with minerals  1 tablet Oral Daily  . sodium chloride flush  3 mL Intravenous Q12H  . spironolactone  12.5 mg Oral Daily    Infusions: . sodium chloride      No Known Allergies  Social History   Social History  . Marital status: Legally Separated    Spouse name: N/A  . Number of children: N/A  . Years of education: N/A   Occupational History  . Not on file.   Social History Main Topics  . Smoking status: Former Smoker    Packs/day: 1.00    Years: 40.00    Types: Cigarettes    Start date: 11/08/1960    Quit date: 09/01/1999  . Smokeless tobacco: Former Systems developer    Types: [place] date: 09/01/1999     Comment: quit in 2011  . Alcohol use No     Comment: quit in 2001  . Drug use: No     Comment: back in the day  used cocaine,alcohol, marijuana  . Sexual activity: Not on file   Other Topics Concern  . Not on file   Social History Narrative  . No narrative on file   Family History: Mother had breast cancer.  No known history of heart attack or stroke.  PHYSICAL EXAM: Vitals:   01/03/17 0544 01/03/17 1441  BP: (!) 149/92 124/86  Pulse: (!) 101 90  Resp: 18 16  Temp: 99.1 F (37.3 C) 98.8 F (37.1 C)     Intake/Output Summary (Last 24 hours) at 01/03/17 1544 Last data filed at 01/03/17 1234  Gross per 24 hour  Intake              675 ml  Output             1450 ml  Net             -775 ml    General:  Frail, Elderly man sitting comfortably. Mild respiratory distress. Using accessory muscles to breath.  HEENT: normal Neck: supple. no JVD  sitting upright. Carotids 2+ bilat; no bruits. No lymphadenopathy or thryomegaly appreciated. Cor: PMI nondisplaced. Regular rate & rhythm. No rubs or murmurs.  +S3 Lungs: clear to auscultation bilaterally. No crackles, rhonchi, or wheezes. Abdomen: soft, nontender, nondistended. No hepatosplenomegaly. No bruits or masses. Good bowel sounds. Extremities: no cyanosis, clubbing, rash, edema Neuro: alert & oriented x 3, cranial nerves grossly intact. moves all 4 extremities w/o difficulty. Affect pleasant.  Results for orders placed or performed during the hospital encounter of 01/02/17 (from the past 24 hour(s))  Basic metabolic panel     Status: Abnormal   Collection Time: 01/03/17  5:08 AM  Result Value Ref Range   Sodium 136 135 - 145 mmol/L   Potassium 4.4 3.5 - 5.1 mmol/L   Chloride 96 (L) 101 - 111 mmol/L   CO2 29 22 - 32 mmol/L   Glucose, Bld 118 (H) 65 - 99 mg/dL   BUN 33 (H) 6 - 20 mg/dL   Creatinine, Ser 1.70 (H) 0.61 - 1.24 mg/dL   Calcium 9.6 8.9 - 10.3 mg/dL   GFR calc non Af Amer 40 (L) >60 mL/min   GFR calc Af Amer 47 (L) >60 mL/min   Anion gap 11 5 - 15   Dg Chest 2 View  Result Date: 01/02/2017 CLINICAL DATA:  Dyspnea, onset yesterday and worsening. EXAM: CHEST  2 VIEW COMPARISON:  10/09/2015 FINDINGS: Right-sided port extends into the low SVC. There are pleural effusions bilaterally, new. There is mild interstitial fluid and vascular prominence. Central and basilar airspace opacities are present. IMPRESSION: Interstitial and alveolar edema. New small pleural effusions. Cannot exclude infectious infiltrates. Electronically Signed   By: Andreas Newport M.D.   On: 01/02/2017 06:39    ECG: Sinus tachycardia. Rate 105 bpm. Left ventricular hypertrophy with repolarization abnormalities. Poor R-wave progression. Cannot rule out prior septal infarct.    Echo 01/03/17: Study Conclusions  - Left ventricle: The cavity size was normal. Wall thickness was   normal. The estimated  ejection fraction was in the range of 10%   to 15%. Diffuse hypokinesis. DIastolic function is abnormal,   indeterminant grade. - Aortic valve: There was moderate regurgitation. Valve area (VTI):   1.08 cm^2. Valve area (Vmax): 1.5 cm^2. Valve area (Vmean): 1.5   cm^2. - Mitral valve: There was moderate regurgitation. - Left atrium: The atrium was mildly dilated. - Right ventricle: Systolic function was mildly to moderately   reduced. - Tricuspid  valve: There was moderate regurgitation. - Pulmonary arteries: Systolic pressure was moderately increased.   PA peak pressure: 49 mm Hg (S).   ASSESSMENT/PLAN:  # Acute systolic and diastolic heart failure: Mr. Flamenco was admitted with shortness of breath and found to have LVEF 10-15% on echocardiogram. Interestingly, he does not appear very volume overloaded. However he does clearly have respiratory distress and BNP was 2000 on admission. He is -1.7L this admission and renal function is worsening with diuresis. He will need a right heart catheterization to better understand his hemodynamics. We will also plan for left heart catheterization. However, we would like to see his renal function in the morning before sending him for her. If it continues to rise, would not get a LHC at this time.  He was started on carvedilol, which is scheduled to increased to 6.25mg  tonight.  Spironolactone was also scheduled to start tonight.  we will stop this given his worsening renal function. Hold off on ACE-I/ARB/ARNI until renal function improves.  IVC was not dilated on echo.  He denies recent URI, making viral cardiomyopathy less likely.  Cisplatin can cause delayed systolic heart failure. He finished treatment in 2015. We'll evaluate for ischemia as above.   Risks and benefits of cardiac catheterization have been discussed with the patient.  The patient understands that risks included but are not limited to stroke (1 in 1000), death (1 in 47), kidney failure  [usually temporary] (1 in 500), bleeding (1 in 200), allergic reaction [possibly serious] (1 in 200). The patient understands and agrees to proceed.   # Acute on chronic renal failure: [name] currently has acute on chronic renal failure.  Baseline creatinine is 1.2 to 1.3.  See above.   Signed: Karrin Eisenmenger C. Oval Linsey, MD, [facility]  01/03/2017, 3:44 PM

## 2017-01-03 NOTE — Progress Notes (Signed)
  Echocardiogram 2D Echocardiogram has been performed.  Darlina Sicilian M 01/03/2017, 10:32 AM

## 2017-01-03 NOTE — Progress Notes (Signed)
PROGRESS NOTE [facility]   Collin Young   HAL:[id number] DOB: [birth year]  DOA: 01/02/2017 PCP: Collin Dy, MD   Brief Narrative:  66 year old male with medical history significant for hypertension, squamous cell carcinoma of the esophagus and left piriform sinus in remission status post chemotherapy and radiation therapy about 3 years ago. He presented to the emergency department complaining of progressive shortness of breath for the past several weeks. He was admitted with concerns of CHF, with BNP significantly elevated at 2000. Echocardiogram revealed ejection fraction of 10-15% patient was started on beta blockers Lasix and Aldactone. Patient also on admission was found to be on acute kidney injury. Geology consulted and awaiting recommendation.   Subjective: Patient seen and examined, report significant improvement feeling back to baseline. No shortness of breath no chest pain or palpitations.  Assessment & Plan: Cardiomyopathy/Acute systolic congestive heart failure - new diagnosis Unclear etiology at this time ? Radiation therapy vs CAD  Echo shows ejection fraction of 10-15% with diffuse hypokinesis, diastolic dfx  Started on Coreg, dose increase to 6.25 titrate as tolerated  Will hold Lisinopril for now as patient with AKI  Start Aldactone 12.5 mg daily  Cardiology consulted  Continue tele monitor  Continue IV lasix for now  Daily weight, I&O's low salt diet   AKI - likely cardiorenal syndrome  Will continue IV lasix, expect Cr to improve with diuresis  Monitor Cr in AM   Hypertension  Per patient he is been taken off his BP meds  On BB and aldactone  Monitor BP   Squamous Cell Ca of the left piriform sinus and esophagus  In remission  Continue home pain med   DVT prophylaxis: Lovenox  Code Status: FULL  Family Communication: Daughter at bedside  Disposition Plan: Home when medically stable   Consultants:   Cardiology   Procedures:   ECHO  01/03/17 ------------------------------------------------------------------- Study Conclusions  - Left ventricle: The cavity size was normal. Wall thickness was   normal. The estimated ejection fraction was in the range of 10%   to 15%. Diffuse hypokinesis. DIastolic function is abnormal,   indeterminant grade. - Aortic valve: There was moderate regurgitation. Valve area (VTI):   1.08 cm^2. Valve area (Vmax): 1.5 cm^2. Valve area (Vmean): 1.5   cm^2. - Mitral valve: There was moderate regurgitation. - Left atrium: The atrium was mildly dilated. - Right ventricle: Systolic function was mildly to moderately   reduced. - Tricuspid valve: There was moderate regurgitation. - Pulmonary arteries: Systolic pressure was moderately increased.   PA peak pressure: 49 mm Hg (S).  Antimicrobials: Anti-infectives    None        Objective: Vitals:   01/02/17 1114 01/02/17 1405 01/02/17 2029 01/03/17 0544  BP: (!) 144/90 129/89 (!) 145/96 (!) 149/92  Pulse: 100 94 94 (!) 101  Resp:  18 18 18   Temp:  98.6 F (37 C) 98.9 F (37.2 C) 99.1 F (37.3 C)  TempSrc:  Oral Oral Oral  SpO2:  99% 100% 100%  Weight:    53.3 kg (117 lb 8 oz)  Height:        Intake/Output Summary (Last 24 hours) at 01/03/17 1418 Last data filed at 01/03/17 1234  Gross per 24 hour  Intake              675 ml  Output             1450 ml  Net             -  775 ml   Filed Weights   01/02/17 0607 01/02/17 1023 01/03/17 0544  Weight: 54.4 kg (120 lb) 55.2 kg (121 lb 9.6 oz) 53.3 kg (117 lb 8 oz)    Examination:  General exam: Calm  HEENT: OP clear Respiratory system: Clear to auscultation. No wheezes,crackle or rhonchi Cardiovascular system: S1 & S2 heard, RRR. Mild JVD. 2/6 systolic murmur Gastrointestinal system: Abdomen is nondistended, soft and nontender. No organomegaly or masses felt.  Central nervous system: Alert and oriented.  Extremities: No pedal edema.  Skin: No rashes, lesions or  ulcers Psychiatry: Judgement and insight appear normal. Mood & affect appropriate.    Data Reviewed: I have personally reviewed following labs and imaging studies  CBC:  Recent Labs Lab 01/02/17 0658  WBC 3.1*  NEUTROABS 2.1  HGB 13.1  HCT 39.3  MCV 86.9  PLT 765   Basic Metabolic Panel:  Recent Labs Lab 01/02/17 0658 01/03/17 0508  NA 135 136  K 4.2 4.4  CL 101 96*  CO2 20* 29  GLUCOSE 168* 118*  BUN 29* 33*  CREATININE 1.54* 1.70*  CALCIUM 9.1 9.6   GFR: Estimated Creatinine Clearance: 32.2 mL/min (A) (by C-G formula based on SCr of 1.7 mg/dL (H)). Liver Function Tests:  Recent Labs Lab 01/02/17 0658  AST 39  ALT 25  ALKPHOS 37*  BILITOT 0.4  PROT 7.1  ALBUMIN 3.9   No results found for this or any previous visit (from the past 240 hour(s)).    Radiology Studies: Dg Chest 2 View  Result Date: 01/02/2017 CLINICAL DATA:  Dyspnea, onset yesterday and worsening. EXAM: CHEST  2 VIEW COMPARISON:  10/09/2015 FINDINGS: Right-sided port extends into the low SVC. There are pleural effusions bilaterally, new. There is mild interstitial fluid and vascular prominence. Central and basilar airspace opacities are present. IMPRESSION: Interstitial and alveolar edema. New small pleural effusions. Cannot exclude infectious infiltrates. Electronically Signed   By: Andreas Newport M.D.   On: 01/02/2017 06:39   Scheduled Meds: . atorvastatin  20 mg Oral Daily  . carvedilol  3.125 mg Oral BID WC  . enoxaparin (LOVENOX) injection  30 mg Subcutaneous Q24H  . fentaNYL  12.5 mcg Transdermal Q72H  . furosemide  40 mg Intravenous Q12H  . multivitamin with minerals  1 tablet Oral Daily  . sodium chloride flush  3 mL Intravenous Q12H   Continuous Infusions: . sodium chloride       LOS: 1 day   Chipper Oman, MD Pager: Text Page via [email or web address]  [phone]  If 7PM-7AM, please contact night-coverage [email or web address] Password [email or web address] 01/03/2017, 2:18 PM

## 2017-01-04 ENCOUNTER — Inpatient Hospital Stay (HOSPITAL_COMMUNITY): Payer: Medicare Other

## 2017-01-04 DIAGNOSIS — I429 Cardiomyopathy, unspecified: Secondary | ICD-10-CM

## 2017-01-04 DIAGNOSIS — I420 Dilated cardiomyopathy: Secondary | ICD-10-CM

## 2017-01-04 DIAGNOSIS — I5043 Acute on chronic combined systolic (congestive) and diastolic (congestive) heart failure: Secondary | ICD-10-CM

## 2017-01-04 DIAGNOSIS — J810 Acute pulmonary edema: Secondary | ICD-10-CM

## 2017-01-04 LAB — CREATININE, URINE, RANDOM: Creatinine, Urine: 102.74 mg/dL

## 2017-01-04 LAB — BASIC METABOLIC PANEL
ANION GAP: 11 (ref 5–15)
BUN: 41 mg/dL — ABNORMAL HIGH (ref 6–20)
CALCIUM: 9 mg/dL (ref 8.9–10.3)
CO2: 30 mmol/L (ref 22–32)
Chloride: 92 mmol/L — ABNORMAL LOW (ref 101–111)
Creatinine, Ser: 1.68 mg/dL — ABNORMAL HIGH (ref 0.61–1.24)
GFR, EST AFRICAN AMERICAN: 47 mL/min — AB (ref >60)
GFR, EST NON AFRICAN AMERICAN: 41 mL/min — AB (ref >60)
Glucose, Bld: 109 mg/dL — ABNORMAL HIGH (ref 65–99)
POTASSIUM: 4.5 mmol/L (ref 3.5–5.1)
Sodium: 133 mmol/L — ABNORMAL LOW (ref 135–145)

## 2017-01-04 LAB — SODIUM, URINE, RANDOM: SODIUM UR: 52 mmol/L

## 2017-01-04 LAB — PROTIME-INR
INR: 1.03
PROTHROMBIN TIME: 13.5 s (ref 11.4–15.2)

## 2017-01-04 MED ORDER — SODIUM CHLORIDE 0.9 % IV SOLN: 250.0000 mL | INTRAVENOUS | Status: DC | PRN

## 2017-01-04 MED ORDER — ALBUTEROL SULFATE (2.5 MG/3ML) 0.083% IN NEBU
2.5000 mg | INHALATION_SOLUTION | RESPIRATORY_TRACT | Status: DC | PRN
  Administered 2017-01-07: 2.5 mg via RESPIRATORY_TRACT
  Filled 2017-01-04: qty 3

## 2017-01-04 MED ORDER — FUROSEMIDE 10 MG/ML IJ SOLN
40.0000 mg | Freq: Once | INTRAMUSCULAR | Status: AC
  Administered 2017-01-04: 40 mg via INTRAVENOUS
  Filled 2017-01-04: qty 4

## 2017-01-04 MED ORDER — ASPIRIN 81 MG PO CHEW
81.0000 mg | CHEWABLE_TABLET | ORAL | Status: AC
  Administered 2017-01-05: 81 mg via ORAL
  Filled 2017-01-04: qty 1

## 2017-01-04 MED ORDER — SODIUM CHLORIDE 0.9 % IV SOLN
INTRAVENOUS | Status: DC
  Administered 2017-01-05: 06:00:00 via INTRAVENOUS

## 2017-01-04 MED ORDER — SODIUM CHLORIDE 0.9% FLUSH: 3.0000 mL | INTRAVENOUS | Status: DC | PRN

## 2017-01-04 MED ORDER — SODIUM CHLORIDE 0.9% FLUSH: 3.0000 mL | Freq: Two times a day (BID) | INTRAVENOUS | Status: DC

## 2017-01-04 MED ORDER — IPRATROPIUM-ALBUTEROL 0.5-2.5 (3) MG/3ML IN SOLN
3.0000 mL | Freq: Four times a day (QID) | RESPIRATORY_TRACT | Status: DC
  Administered 2017-01-04: 3 mL via RESPIRATORY_TRACT
  Filled 2017-01-04: qty 3

## 2017-01-04 MED ORDER — IPRATROPIUM-ALBUTEROL 0.5-2.5 (3) MG/3ML IN SOLN
3.0000 mL | Freq: Three times a day (TID) | RESPIRATORY_TRACT | Status: DC
  Administered 2017-01-04 – 2017-01-07 (×8): 3 mL via RESPIRATORY_TRACT
  Filled 2017-01-04 (×9): qty 3

## 2017-01-04 NOTE — Progress Notes (Signed)
PROGRESS NOTE [facility]   Collin Young   LPF:[id number] DOB: December 03, 1950  DOA: 01/02/2017 PCP: [name], MD   Brief Narrative:  66 year old male with medical history significant for hypertension, squamous cell carcinoma of the esophagus and left piriform sinus in remission status post chemotherapy and radiation therapy about 3 years ago. He presented to the emergency department complaining of progressive shortness of breath for the past several weeks. He was admitted with concerns of CHF, with BNP significantly elevated at 2000. Echocardiogram revealed ejection fraction of 10-15% patient was started on beta blockers Lasix and Aldactone. Patient also on admission was found to be on acute kidney injury. Geology consulted and awaiting recommendation.   Subjective: Patient seen and examined, report significant improvement feeling back to baseline. No shortness of breath no chest pain or palpitations.  Assessment & Plan: Cardiomyopathy/Acute systolic congestive heart failure - new diagnosis Unclear etiology at this time ? Radiation therapy vs CAD  Echo shows ejection fraction of 10-15% with diffuse hypokinesis, diastolic dfx  Started on Coreg, dose increase to 6.25 titrate as tolerated  ACE on hold due to AKI   Aldactone was hold as well due to AKI  Cardiology planning for cardiac cath R and left side tomorrow morning  Continue tele monitor  Per cardiology Lasix on hold  Daily weight, I&O's low salt diet   AKI - I continue to suspect cardiorenal syndrome Poor CO leading to poor kidney perfusion.  Holding Lasix - although patient sounded wet on exam and had SOB overnight. Will continue management per cardiology recommendations Check Cr in AM  Will get renal US   Hypertension  Per patient was taken off his BP meds by PCP  Now on BB  Monitor BP   Squamous Cell Ca of the left piriform sinus and esophagus  In remission  Continue home pain med   DVT prophylaxis:  Lovenox  Code Status: FULL  Family Communication: Daughter at bedside  Disposition Plan: Home when medically stable   Consultants:   Cardiology   Procedures:   ECHO 01/03/17 ------------------------------------------------------------------- Study Conclusions  - Left ventricle: The cavity size was normal. Wall thickness was   normal. The estimated ejection fraction was in the range of 10%   to 15%. Diffuse hypokinesis. DIastolic function is abnormal,   indeterminant grade. - Aortic valve: There was moderate regurgitation. Valve area (VTI):   1.08 cm^2. Valve area (Vmax): 1.5 cm^2. Valve area (Vmean): 1.5   cm^2. - Mitral valve: There was moderate regurgitation. - Left atrium: The atrium was mildly dilated. - Right ventricle: Systolic function was mildly to moderately   reduced. - Tricuspid valve: There was moderate regurgitation. - Pulmonary arteries: Systolic pressure was moderately increased.   PA peak pressure: 49 mm Hg (S).  Antimicrobials: Anti-infectives    None       Objective: Vitals:   01/03/17 1441 01/03/17 1827 01/03/17 2056 01/04/17 0532  BP: 124/86 (!) 126/96 116/75 123/82  Pulse: 90  78 96  Resp: 16  18 18   Temp: 98.8 F (37.1 C)  99.9 F (37.7 C) 99.2 F (37.3 C)  TempSrc: Oral  Oral Oral  SpO2: 100% 100% 100% 100%  Weight:    53.8 kg (118 lb 9.6 oz)  Height:        Intake/Output Summary (Last 24 hours) at 01/04/17 1204 Last data filed at 01/04/17 0930  Gross per 24 hour  Intake  0 ml  Output              950 ml  Net             -950 ml   Filed Weights   01/02/17 1023 01/03/17 0544 01/04/17 0532  Weight: 55.2 kg (121 lb 9.6 oz) 53.3 kg (117 lb 8 oz) 53.8 kg (118 lb 9.6 oz)    Examination:  General exam: NAD, Frail  HEENT: O2 Nasal canula in place  Respiratory system: Good air entry, mild crackles at the bases.  Cardiovascular system: Z1I4 + S3, 2/6 systolic murmur LSB.  Gastrointestinal system: Abd Soft  NTND Extremities: No LE edema  Skin: No rashes  Psychiatry: Mood appropriate   Data Reviewed: I have personally reviewed following labs and imaging studies  CBC:  Recent Labs Lab 01/02/17 0658  WBC 3.1*  NEUTROABS 2.1  HGB 13.1  HCT 39.3  MCV 86.9  PLT 580   Basic Metabolic Panel:  Recent Labs Lab 01/02/17 0658 01/03/17 0508 01/04/17 0511  NA 135 136 133*  K 4.2 4.4 4.5  CL 101 96* 92*  CO2 20* 29 30  GLUCOSE 168* 118* 109*  BUN 29* 33* 41*  CREATININE 1.54* 1.70* 1.68*  CALCIUM 9.1 9.6 9.0   GFR: Estimated Creatinine Clearance: 32.9 mL/min (A) (by C-G formula based on SCr of 1.68 mg/dL (H)). Liver Function Tests:  Recent Labs Lab 01/02/17 0658  AST 39  ALT 25  ALKPHOS 37*  BILITOT 0.4  PROT 7.1  ALBUMIN 3.9   No results found for this or any previous visit (from the past 240 hour(s)).    Radiology Studies: No results found. Scheduled Meds: . atorvastatin  20 mg Oral Daily  . carvedilol  6.25 mg Oral BID WC  . enoxaparin (LOVENOX) injection  30 mg Subcutaneous Q24H  . fentaNYL  12.5 mcg Transdermal Q72H  . multivitamin with minerals  1 tablet Oral Daily  . sodium chloride flush  3 mL Intravenous Q12H  . sodium chloride flush  3 mL Intravenous Q12H   Continuous Infusions: . sodium chloride    . sodium chloride    . sodium chloride 10 mL/hr at 01/04/17 0522     LOS: 2 days   Chipper Oman, MD Pager: Text Page via [email or web address]  [phone]  If 7PM-7AM, please contact night-coverage [email or web address] Password [email or web address] 01/04/2017, 12:04 PM

## 2017-01-04 NOTE — Care Management Note (Signed)
Case Management Note  Patient Details  Name: Collin Young MRN: [id number] Date of Birth: 01-29-1951  Subjective/Objective:66 y/o m admitted w/New CHF. From home. Cardio following-cardiac cath. Monitor for d/c needs.                    Action/Plan:d/c plan home.   Expected Discharge Date:                  Expected Discharge Plan:  Home/Self Care  In-House Referral:     Discharge planning Services  CM Consult  Post Acute Care Choice:    Choice offered to:     DME Arranged:    DME Agency:     HH Arranged:    HH Agency:     Status of Service:  In process, will continue to follow  If discussed at Long Length of Stay Meetings, dates discussed:    Additional Comments:  Dessa Phi, RN 01/04/2017, 12:47 PM

## 2017-01-04 NOTE — Progress Notes (Addendum)
Progress Note  Patient Name: Collin Young Date of Encounter: 01/04/2017  Primary Cardiologist: Dr Oval Linsey (new)  Subjective   Up in chair. SOB was improved with O2.   Inpatient Medications    Scheduled Meds: . atorvastatin  20 mg Oral Daily  . carvedilol  6.25 mg Oral BID WC  . enoxaparin (LOVENOX) injection  30 mg Subcutaneous Q24H  . fentaNYL  12.5 mcg Transdermal Q72H  . multivitamin with minerals  1 tablet Oral Daily  . sodium chloride flush  3 mL Intravenous Q12H  . sodium chloride flush  3 mL Intravenous Q12H   Continuous Infusions: . sodium chloride    . sodium chloride    . sodium chloride 10 mL/hr at 01/04/17 0522   PRN Meds: sodium chloride, sodium chloride, acetaminophen, ondansetron (ZOFRAN) IV, polyethylene glycol, sodium chloride flush, sodium chloride flush   Vital Signs    Vitals:   01/03/17 1441 01/03/17 1827 01/03/17 2056 01/04/17 0532  BP: 124/86 (!) 126/96 116/75 123/82  Pulse: 90  78 96  Resp: 16  18 18   Temp: 98.8 F (37.1 C)  99.9 F (37.7 C) 99.2 F (37.3 C)  TempSrc: Oral  Oral Oral  SpO2: 100% 100% 100% 100%  Weight:    118 lb 9.6 oz (53.8 kg)  Height:        Intake/Output Summary (Last 24 hours) at 01/04/17 1126 Last data filed at 01/04/17 0930  Gross per 24 hour  Intake                0 ml  Output              950 ml  Net             -950 ml   Filed Weights   01/02/17 1023 01/03/17 0544 01/04/17 0532  Weight: 121 lb 9.6 oz (55.2 kg) 117 lb 8 oz (53.3 kg) 118 lb 9.6 oz (53.8 kg)    Telemetry    NSR - Personally Reviewed  ECG    01/02/17- NSR, Q V2, LVH- Personally Reviewed  Physical Exam   GEN: No acute distress. cachectic, on O2  Neck: No JVD Cardiac: RRR, soft systolic murmur LSB, no rubs, or gallops.  Respiratory: Clear to auscultation bilaterally. GI: Soft, nontender, non-distended  MS: No edema; No deformity. Neuro:  Nonfocal  Psych: Normal affect   Labs    Chemistry Recent Labs Lab 01/02/17 0658  01/03/17 0508 01/04/17 0511  NA 135 136 133*  K 4.2 4.4 4.5  CL 101 96* 92*  CO2 20* 29 30  GLUCOSE 168* 118* 109*  BUN 29* 33* 41*  CREATININE 1.54* 1.70* 1.68*  CALCIUM 9.1 9.6 9.0  PROT 7.1  --   --   ALBUMIN 3.9  --   --   AST 39  --   --   ALT 25  --   --   ALKPHOS 37*  --   --   BILITOT 0.4  --   --   GFRNONAA 45* 40* 41*  GFRAA 53* 47* 47*  ANIONGAP 14 11 11      Hematology Recent Labs Lab 01/02/17 0658  WBC 3.1*  RBC 4.52  HGB 13.1  HCT 39.3  MCV 86.9  MCH 29.0  MCHC 33.3  RDW 14.3  PLT 192    Cardiac EnzymesNo results for input(s): TROPONINI in the last 168 hours.  Recent Labs Lab 01/02/17 0713  TROPIPOC 0.04     BNP Recent Labs  Lab 01/02/17 0658  BNP 1,999.4*     DDimer No results for input(s): DDIMER in the last 168 hours.   Radiology    CXR-01/02/17 CHEST  2 VIEW  COMPARISON:  10/09/2015  FINDINGS: Right-sided port extends into the low SVC.  There are pleural effusions bilaterally, new. There is mild interstitial fluid and vascular prominence. Central and basilar airspace opacities are present.  IMPRESSION: Interstitial and alveolar edema. New small pleural effusions. Cannot exclude infectious infiltrates.   Cardiac Studies   Echo 01/03/17- Study Conclusions  - Left ventricle: The cavity size was normal. Wall thickness was   normal. The estimated ejection fraction was in the range of 10%   to 15%. Diffuse hypokinesis. DIastolic function is abnormal,   indeterminant grade. - Aortic valve: There was moderate regurgitation. Valve area (VTI):   1.08 cm^2. Valve area (Vmax): 1.5 cm^2. Valve area (Vmean): 1.5   cm^2. - Mitral valve: There was moderate regurgitation. - Left atrium: The atrium was mildly dilated. - Right ventricle: Systolic function was mildly to moderately   reduced. - Tricuspid valve: There was moderate regurgitation. - Pulmonary arteries: Systolic pressure was moderately increased.   PA peak pressure:  49 mm Hg (S).    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 He is s/p chemotherapy and radiation 2015 (remission). He was admitted 01/02/17 with shortness of breath and found to have a BNP of 2k and an EF of 10-15% by echo. Previous EF was normal by Myoview in 2011.  Assessment & Plan    Acute systolic CHF- New for him. He diuresed 2.2L after a coupled of doses of Lasix.   Cardiomyopathy- Etiology not yet determined (suspect NICM with diffuse HK)  Acute renal insufficiency It appears his baseline SCr is 1-1.2  H/O sinus and esophageal CA-  Treated with chemo and radiation. Followed by Dr Marin Olp.  Plan- unable to cath today- schedule is full and his SCr is still elevated. Will resume diet. He is on the schedule for Rt and Lt heart cath with Dr Tamala Julian tomorrow at 1:30- pending his renal function. He is not on a diuretic or ACE/ARB.   Signed, Kerin Ransom, PA-C  01/04/2017, 11:26 AM    The patient was seen, examined and discussed with Kerin Ransom, PA-C and I agree with the above.   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 He is s/p chemotherapy and radiation 2015 (remission). He was admitted 01/02/17 with shortness of breath and found to have a BNP of 2k and an EF of 10-15% by echo. Previous EF was normal by Myoview in 2011. The patient diuresed 2 L, negative 3 lbs and appears mildly fluid overloaded and wheezing, Crea still 1.68, I will give lasix 40 mg iv x1, add breathing treatments plan for a cath for tomorrow, add ASA 81 mg po daily, continue coreg, atorvastatin, no ACEI/ARB given CKD stage 3.  Ena Dawley, MD 01/04/2017

## 2017-01-05 ENCOUNTER — Other Ambulatory Visit: Payer: Self-pay

## 2017-01-05 ENCOUNTER — Encounter (HOSPITAL_COMMUNITY)
Admission: EM | Disposition: A | Discharge status: Home Health | Payer: Self-pay | Source: Home / Self Care | Attending: Family Medicine

## 2017-01-05 DIAGNOSIS — I509 Heart failure, unspecified: Secondary | ICD-10-CM

## 2017-01-05 DIAGNOSIS — I2510 Atherosclerotic heart disease of native coronary artery without angina pectoris: Secondary | ICD-10-CM

## 2017-01-05 DIAGNOSIS — I255 Ischemic cardiomyopathy: Secondary | ICD-10-CM | POA: Diagnosis present

## 2017-01-05 DIAGNOSIS — N400 Enlarged prostate without lower urinary tract symptoms: Secondary | ICD-10-CM

## 2017-01-05 HISTORY — PX: RIGHT/LEFT HEART CATH AND CORONARY ANGIOGRAPHY: CATH118266

## 2017-01-05 LAB — POCT I-STAT 3, ART BLOOD GAS (G3+)
Acid-base deficit: 1 mmol/L (ref 0.0–2.0)
Bicarbonate: 18.4 mmol/L — ABNORMAL LOW (ref 20.0–28.0)
O2 Saturation: 99 %
PCO2 ART: 18.9 mmHg — AB (ref 32.0–48.0)
PH ART: 7.595 — AB (ref 7.350–7.450)
TCO2: 19 mmol/L (ref 0–100)
pO2, Arterial: 108 mmHg (ref 83.0–108.0)

## 2017-01-05 LAB — BASIC METABOLIC PANEL
Anion gap: 11 (ref 5–15)
BUN: 46 mg/dL — AB (ref 6–20)
CALCIUM: 8.9 mg/dL (ref 8.9–10.3)
CHLORIDE: 89 mmol/L — AB (ref 101–111)
CO2: 31 mmol/L (ref 22–32)
CREATININE: 1.72 mg/dL — AB (ref 0.61–1.24)
GFR calc non Af Amer: 40 mL/min — ABNORMAL LOW (ref >60)
GFR, EST AFRICAN AMERICAN: 46 mL/min — AB (ref >60)
GLUCOSE: 119 mg/dL — AB (ref 65–99)
Potassium: 4.6 mmol/L (ref 3.5–5.1)
Sodium: 131 mmol/L — ABNORMAL LOW (ref 135–145)

## 2017-01-05 LAB — BRAIN NATRIURETIC PEPTIDE: B Natriuretic Peptide: 2182.6 pg/mL — ABNORMAL HIGH (ref 0.0–100.0)

## 2017-01-05 LAB — POCT I-STAT 3, VENOUS BLOOD GAS (G3P V)
Acid-Base Excess: 4 mmol/L — ABNORMAL HIGH (ref 0.0–2.0)
Acid-Base Excess: 6 mmol/L — ABNORMAL HIGH (ref 0.0–2.0)
BICARBONATE: 27.4 mmol/L (ref 20.0–28.0)
Bicarbonate: 30.1 mmol/L — ABNORMAL HIGH (ref 20.0–28.0)
O2 Saturation: 36 %
O2 Saturation: 42 %
PCO2 VEN: 37.6 mmHg — AB (ref 44.0–60.0)
PCO2 VEN: 42 mmHg — AB (ref 44.0–60.0)
PH VEN: 7.463 — AB (ref 7.250–7.430)
PH VEN: 7.471 — AB (ref 7.250–7.430)
PO2 VEN: 20 mmHg — AB (ref 32.0–45.0)
PO2 VEN: 22 mmHg — AB (ref 32.0–45.0)
TCO2: 29 mmol/L (ref 0–100)
TCO2: 31 mmol/L (ref 0–100)

## 2017-01-05 LAB — POCT ACTIVATED CLOTTING TIME: Activated Clotting Time: 180 seconds

## 2017-01-05 SURGERY — RIGHT/LEFT HEART CATH AND CORONARY ANGIOGRAPHY
Anesthesia: LOCAL

## 2017-01-05 MED ORDER — MIDAZOLAM HCL 2 MG/2ML IJ SOLN
INTRAMUSCULAR | Status: DC | PRN
  Administered 2017-01-05: 1 mg via INTRAVENOUS

## 2017-01-05 MED ORDER — LIDOCAINE HCL (PF) 1 % IJ SOLN
INTRAMUSCULAR | Status: DC | PRN
  Administered 2017-01-05 (×2): 7 mL

## 2017-01-05 MED ORDER — SODIUM CHLORIDE 0.9 % IV SOLN: 250.0000 mL | INTRAVENOUS | Status: DC | PRN

## 2017-01-05 MED ORDER — SODIUM CHLORIDE 0.9% FLUSH
3.0000 mL | INTRAVENOUS | Status: DC | PRN
  Administered 2017-01-12: 3 mL via INTRAVENOUS
  Filled 2017-01-05: qty 3

## 2017-01-05 MED ORDER — SODIUM CHLORIDE 0.9 % IV SOLN: INTRAVENOUS | Status: AC

## 2017-01-05 MED ORDER — LORAZEPAM 0.5 MG PO TABS
0.5000 mg | ORAL_TABLET | Freq: Once | ORAL | Status: AC
  Administered 2017-01-05: 0.5 mg via ORAL
  Filled 2017-01-05: qty 1

## 2017-01-05 MED ORDER — HEPARIN (PORCINE) IN NACL 2-0.9 UNIT/ML-% IJ SOLN
INTRAMUSCULAR | Status: DC | PRN
  Administered 2017-01-05: 1000 mL

## 2017-01-05 MED ORDER — VERAPAMIL HCL 2.5 MG/ML IV SOLN
INTRAVENOUS | Status: DC | PRN
  Administered 2017-01-05: 10 mL via INTRA_ARTERIAL

## 2017-01-05 MED ORDER — IOPAMIDOL (ISOVUE-370) INJECTION 76%
INTRAVENOUS | Status: DC | PRN
  Administered 2017-01-05: 45 mL via INTRA_ARTERIAL

## 2017-01-05 MED ORDER — HEPARIN SODIUM (PORCINE) 1000 UNIT/ML IJ SOLN
INTRAMUSCULAR | Status: DC | PRN
  Administered 2017-01-05: 3000 [IU] via INTRAVENOUS

## 2017-01-05 MED ORDER — SODIUM CHLORIDE 0.9% FLUSH
3.0000 mL | Freq: Two times a day (BID) | INTRAVENOUS | Status: DC
  Administered 2017-01-06 – 2017-01-16 (×14): 3 mL via INTRAVENOUS

## 2017-01-05 SURGICAL SUPPLY — 16 items
CATH EXPO 5F MPA-1 (CATHETERS) ×1 IMPLANT
CATH INFINITI 5 FR JL3.5 (CATHETERS) ×1 IMPLANT
CATH INFINITI JR4 5F (CATHETERS) ×1 IMPLANT
CATH SWAN GANZ 7F STRAIGHT (CATHETERS) ×1 IMPLANT
DEVICE RAD COMP TR BAND LRG (VASCULAR PRODUCTS) ×1 IMPLANT
GLIDESHEATH SLEND SS 6F .021 (SHEATH) ×1 IMPLANT
GUIDEWIRE .025 260CM (WIRE) ×1 IMPLANT
GUIDEWIRE INQWIRE 1.5J.035X260 (WIRE) IMPLANT
INQWIRE 1.5J .035X260CM (WIRE) ×2
KIT HEART LEFT (KITS) ×2 IMPLANT
KIT MICROINTRODUCER STIFF 5F (SHEATH) ×1 IMPLANT
PACK CARDIAC CATHETERIZATION (CUSTOM PROCEDURE TRAY) ×2 IMPLANT
SHEATH FAST CATH BRACH 5F 5CM (SHEATH) IMPLANT
SHEATH PINNACLE 7F 10CM (SHEATH) ×1 IMPLANT
TRANSDUCER W/STOPCOCK (MISCELLANEOUS) ×2 IMPLANT
TUBING CIL FLEX 10 FLL-RA (TUBING) ×2 IMPLANT

## 2017-01-05 NOTE — Progress Notes (Signed)
Pharmacist Heart Failure Core Measure Documentation  Assessment: Collin Young has an EF documented as 10-15% on 01/03/17 by echo.  Rationale: Heart failure patients with left ventricular systolic dysfunction (LVSD) and an EF < 40% should be prescribed an angiotensin converting enzyme inhibitor (ACEI) or angiotensin receptor blocker (ARB) at discharge unless a contraindication is documented in the medical record.  This patient is not currently on an ACEI or ARB for HF.  This note is being placed in the record in order to provide documentation that a contraindication to the use of these agents is present for this encounter.  ACE Inhibitor or Angiotensin Receptor Blocker is contraindicated (specify all that apply)  []   ACEI allergy AND ARB allergy []   Angioedema []   Moderate or severe aortic stenosis []   Hyperkalemia []   Hypotension []   Renal artery stenosis [x]   Worsening renal function, preexisting renal disease or dysfunction   Lynelle Doctor 01/05/2017 6:57 AM

## 2017-01-05 NOTE — Progress Notes (Signed)
Progress Note  Patient Name: Collin Young Date of Encounter: 01/05/2017  Primary Cardiologist: Dr Oval Linsey (new)  Subjective   Up in chair. SOB was improved with O2.   Inpatient Medications    Scheduled Meds: . atorvastatin  20 mg Oral Daily  . carvedilol  6.25 mg Oral BID WC  . enoxaparin (LOVENOX) injection  30 mg Subcutaneous Q24H  . fentaNYL  12.5 mcg Transdermal Q72H  . ipratropium-albuterol  3 mL Nebulization TID  . multivitamin with minerals  1 tablet Oral Daily  . sodium chloride flush  3 mL Intravenous Q12H  . sodium chloride flush  3 mL Intravenous Q12H  . sodium chloride flush  3 mL Intravenous Q12H   Continuous Infusions: . sodium chloride    . sodium chloride    . sodium chloride 10 mL/hr at 01/04/17 0522  . sodium chloride    . sodium chloride 10 mL/hr at 01/05/17 0034   PRN Meds: sodium chloride, sodium chloride, sodium chloride, acetaminophen, albuterol, ondansetron (ZOFRAN) IV, polyethylene glycol, sodium chloride flush, sodium chloride flush, sodium chloride flush   Vital Signs    Vitals:   01/04/17 1541 01/04/17 2048 01/05/17 0432 01/05/17 0828  BP: 120/71 116/62 139/83   Pulse: 92 97 (!) 102   Resp: 18 20    Temp: 97.4 F (36.3 C) 98.9 F (37.2 C) 98 F (36.7 C)   TempSrc: Axillary Oral Oral   SpO2: 100% 98% 100% 99%  Weight:   119 lb 12.8 oz (54.3 kg)   Height:        Intake/Output Summary (Last 24 hours) at 01/05/17 0837 Last data filed at 01/05/17 0600  Gross per 24 hour  Intake              870 ml  Output             1000 ml  Net             -130 ml   Filed Weights   01/03/17 0544 01/04/17 0532 01/05/17 0432  Weight: 117 lb 8 oz (53.3 kg) 118 lb 9.6 oz (53.8 kg) 119 lb 12.8 oz (54.3 kg)    Telemetry    NSR - Personally Reviewed  ECG    01/02/17- NSR, Q V2, LVH- Personally Reviewed  Physical Exam   GEN: No acute distress. cachectic, on O2  Neck: No JVD Cardiac: RRR, soft systolic murmur LSB, no rubs, or gallops.    Respiratory: decreased overall, scattered rhonchi GI: Soft, nontender, non-distended  MS: No edema; No deformity. Neuro:  Nonfocal  Psych: Normal affect   Labs    Chemistry  Recent Labs Lab 01/02/17 0658 01/03/17 0508 01/04/17 0511 01/05/17 0625  NA 135 136 133* 131*  K 4.2 4.4 4.5 4.6  CL 101 96* 92* 89*  CO2 20* 29 30 31   GLUCOSE 168* 118* 109* 119*  BUN 29* 33* 41* 46*  CREATININE 1.54* 1.70* 1.68* 1.72*  CALCIUM 9.1 9.6 9.0 8.9  PROT 7.1  --   --   --   ALBUMIN 3.9  --   --   --   AST 39  --   --   --   ALT 25  --   --   --   ALKPHOS 37*  --   --   --   BILITOT 0.4  --   --   --   GFRNONAA 45* 40* 41* 40*  GFRAA 53* 47* 47* 46*  ANIONGAP 14 11  11 11     Hematology  Recent Labs Lab 01/02/17 0658  WBC 3.1*  RBC 4.52  HGB 13.1  HCT 39.3  MCV 86.9  MCH 29.0  MCHC 33.3  RDW 14.3  PLT 192    Cardiac EnzymesNo results for input(s): TROPONINI in the last 168 hours.   Recent Labs Lab 01/02/17 0713  TROPIPOC 0.04     BNP  Recent Labs Lab 01/02/17 0658 01/05/17 0625  BNP 1,999.4* 2,182.6*     DDimer No results for input(s): DDIMER in the last 168 hours.   Radiology    CXR-01/02/17 CHEST  2 VIEW  COMPARISON:  10/09/2015  FINDINGS: Right-sided port extends into the low SVC.  There are pleural effusions bilaterally, new. There is mild interstitial fluid and vascular prominence. Central and basilar airspace opacities are present.  IMPRESSION: Interstitial and alveolar edema. New small pleural effusions. Cannot exclude infectious infiltrates.   Cardiac Studies   Echo 01/03/17- Study Conclusions  - Left ventricle: The cavity size was normal. Wall thickness was   normal. The estimated ejection fraction was in the range of 10%   to 15%. Diffuse hypokinesis. DIastolic function is abnormal,   indeterminant grade. - Aortic valve: There was moderate regurgitation. Valve area (VTI):   1.08 cm^2. Valve area (Vmax): 1.5 cm^2. Valve area  (Vmean): 1.5   cm^2. - Mitral valve: There was moderate regurgitation. - Left atrium: The atrium was mildly dilated. - Right ventricle: Systolic function was mildly to moderately   reduced. - Tricuspid valve: There was moderate regurgitation. - Pulmonary arteries: Systolic pressure was moderately increased.   PA peak pressure: 49 mm Hg (S).    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 He is s/p chemotherapy and radiation 2015 (remission). He was admitted 01/02/17 with shortness of breath and found to have a BNP of 2k and an EF of 10-15% by echo. Previous EF was normal by Myoview in 2011.  Assessment & Plan    Acute systolic CHF- New for him. He diuresed 2.0L after a coupled of doses of Lasix. BNP actually higher than on admission- 2186 today.   Cardiomyopathy- Etiology not yet determined (suspect NICM with diffuse HK)  Acute renal insufficiency It appears his baseline SCr is 1-1.2. SCr 1.7 today-  H/O sinus and esophageal CA-  Treated with chemo and radiation. Followed by Dr Marin Olp.  Plan- Discussed with Dr Meda Coffee- plan on cors only today. Will keep at [facility] for CHF consult as well.  Signed, Kerin Ransom, PA-C  01/05/2017, 8:37 AM    The patient was seen, examined and discussed with Kerin Ransom, PA-C and I agree with the above.   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 He is s/p chemotherapy and radiation 2015, now in remission for both. He was admitted 01/02/17 with shortness of breath and found to have a BNP of 2k and an EF of 10-15% by echo. Previous EF was normal by Myoview in 2011. The patient diuresed 2 L, negative 3 lbs. He is now euvolemic. Wheezing has improved. Crea still 1.7, hold lasix today, added breathing treatments   Plan: - right and diagnostic left cath today, if intervention needed  we can stage it as he has CKD stage 3 -  Continue ASA 81 mg po daily, coreg, atorvastatin, no ACEI/ARB  given CKD stage 3.  Ena Dawley, MD 01/05/2017

## 2017-01-05 NOTE — Progress Notes (Signed)
Received from [facility] by [employer]. Saline locks X 2, each flushed w/sterile saline and locked. Denies pain. Alert and oriented. Moniter shows SR.On room air.

## 2017-01-05 NOTE — Care Management Note (Signed)
Case Management Note  Patient Details  Name: Collin Young MRN: [id number] Date of Birth: [birth year]  Subjective/Objective:  Transfer to [facility] cath today.                  Action/Plan:d/c plan home.   Expected Discharge Date:                  Expected Discharge Plan:  Home/Self Care  In-House Referral:     Discharge planning Services  CM Consult  Post Acute Care Choice:    Choice offered to:     DME Arranged:    DME Agency:     HH Arranged:    HH Agency:     Status of Service:  In process, will continue to follow  If discussed at Long Length of Stay Meetings, dates discussed:    Additional Comments:  Dessa Phi, RN 01/05/2017, 1:13 PM

## 2017-01-05 NOTE — Interval H&P Note (Signed)
History and Physical Interval Note:  01/05/2017 3:20 PM  Collin Young  has presented today for cardiac cath with the diagnosis of volume overload, cardiomyopathy. The various methods of treatment have been discussed with the patient and family. After consideration of risks, benefits and other options for treatment, the patient has consented to  Procedure(s): Right/Left Heart Cath and Coronary Angiography (N/A) as a surgical intervention .  The patient's history has been reviewed, patient examined, no change in status, stable for surgery.  I have reviewed the patient's chart and labs.  Questions were answered to the patient's satisfaction.    Cath Lab Visit (complete for each Cath Lab visit)  Clinical Evaluation Leading to the Procedure:   ACS: No.  Non-ACS:    Anginal Classification: CCS II  Anti-ischemic medical therapy: No Therapy  Non-Invasive Test Results: No non-invasive testing performed  Prior CABG: No previous CABG        Lauree Chandler

## 2017-01-05 NOTE — Progress Notes (Signed)
PROGRESS NOTE [facility]   BISHOP VANDERWERF   MCN:[id number] DOB: 1950-09-21  DOA: 01/02/2017 PCP: [name], MD   Brief Narrative:  66 year old male with medical history significant for hypertension, squamous cell carcinoma of the esophagus and left piriform sinus in remission status post chemotherapy and radiation therapy about 3 years ago. He presented to the emergency department complaining of progressive shortness of breath for the past several weeks. He was admitted with concerns of CHF, with BNP significantly elevated at 2000. Echocardiogram revealed ejection fraction of 10-15% patient was started on beta blockers Lasix and Aldactone. Patient also on admission was found to be on acute kidney injury. Cardiology consulted, recommending cardiac cath in both sides. Patient will be [facility] to [facility] for [facility] management.   Subjective: Patient seen and examined, no complaints this morning, no acute events overnight. Remains afebrile   Assessment & Plan: Cardiomyopathy/Acute systolic congestive heart failure - new diagnosis Unclear etiology at this time ? Radiation/Chemo therapy vs CAD  Echo shows ejection fraction of 10-15% with diffuse hypokinesis, diastolic dfx  Continue coreg 6.25 mg titrated as tolerated  ACE and aldactone on hold due to AKI   Lasix continues to be on hold per cardiology  Patient will be transfer to [place] for cardiac cath and Cardiology team will take over  Continue tele monitor  Daily weight, I&O's low salt diet   AKI - suspect cardiorenal syndrome Poor CO leading to poor kidney perfusion. - FeNA 0.64% suggestive of prerenal etiology  Holding Lasix per cardiology recommendations, consider to resume dose after cath as BNP is higher and UOP is less.  Renal US show complex left cystic mass, warrants additional surveillance with MRI or CT with contrast will defer after patient stable given patient will receive multiple contrast doses for cardiac  cath. This can be follow as outpatient as well.   Enlarge prostate  Asymptomatic  Will send PSA  Consider urological evaluation if PSA elevated   Hypertension - stable  Per patient was taken off his BP meds by PCP PTA  Now on BB  Monitor BP   Squamous Cell Ca of the left piriform sinus and esophagus  In remission  Continue home pain med - Fentanyl patch   DVT prophylaxis: Lovenox  Code Status: FULL  Family Communication: Family at bedside  Disposition Plan: Transfer to [facility]   Consultants:   Cardiology   Procedures:   ECHO 01/03/17 ------------------------------------------------------------------- Study Conclusions  - Left ventricle: The cavity size was normal. Wall thickness was   normal. The estimated ejection fraction was in the range of 10%   to 15%. Diffuse hypokinesis. DIastolic function is abnormal,   indeterminant grade. - Aortic valve: There was moderate regurgitation. Valve area (VTI):   1.08 cm^2. Valve area (Vmax): 1.5 cm^2. Valve area (Vmean): 1.5   cm^2. - Mitral valve: There was moderate regurgitation. - Left atrium: The atrium was mildly dilated. - Right ventricle: Systolic function was mildly to moderately   reduced. - Tricuspid valve: There was moderate regurgitation. - Pulmonary arteries: Systolic pressure was moderately increased.   PA peak pressure: 49 mm Hg (S).  Antimicrobials: Anti-infectives    None       Objective: Vitals:   01/04/17 2048 01/05/17 0432 01/05/17 0828 01/05/17 0851  BP: 116/62 139/83    Pulse: 97 (!) 102  99  Resp: 20     Temp: 98.9 F (37.2 C) 98 F (36.7 C)    TempSrc: Oral Oral  SpO2: 98% 100% 99% 100%  Weight:  54.3 kg (119 lb 12.8 oz)    Height:        Intake/Output Summary (Last 24 hours) at 01/05/17 1127 Last data filed at 01/05/17 0600  Gross per 24 hour  Intake              870 ml  Output              750 ml  Net              120 ml   Filed Weights   01/03/17 0544 01/04/17 0532  01/05/17 0432  Weight: 53.3 kg (117 lb 8 oz) 53.8 kg (118 lb 9.6 oz) 54.3 kg (119 lb 12.8 oz)    Examination:  General exam: Frail, calm in good spirits  Respiratory system: Good air entry, no crackles appreciated  Cardiovascular system: H8I6 RRR 2/6 systolic murmurs + S3 no JVD   Gastrointestinal system: Abd Soft NTND Extremities: No pedal edema  Skin: No lesions or rash   Data Reviewed: I have personally reviewed following labs and imaging studies  CBC:  Recent Labs Lab 01/02/17 0658  WBC 3.1*  NEUTROABS 2.1  HGB 13.1  HCT 39.3  MCV 86.9  PLT 962   Basic Metabolic Panel:  Recent Labs Lab 01/02/17 0658 01/03/17 0508 01/04/17 0511 01/05/17 0625  NA 135 136 133* 131*  K 4.2 4.4 4.5 4.6  CL 101 96* 92* 89*  CO2 20* 29 30 31   GLUCOSE 168* 118* 109* 119*  BUN 29* 33* 41* 46*  CREATININE 1.54* 1.70* 1.68* 1.72*  CALCIUM 9.1 9.6 9.0 8.9   GFR: Estimated Creatinine Clearance: 32.4 mL/min (A) (by C-G formula based on SCr of 1.72 mg/dL (H)). Liver Function Tests:  Recent Labs Lab 01/02/17 0658  AST 39  ALT 25  ALKPHOS 37*  BILITOT 0.4  PROT 7.1  ALBUMIN 3.9   No results found for this or any previous visit (from the past 240 hour(s)).    Radiology Studies: US Renal  Result Date: 01/04/2017 CLINICAL DATA:  Acute renal insufficiency.  Hypertension. EXAM: RENAL / URINARY TRACT ULTRASOUND COMPLETE COMPARISON:  None. FINDINGS: Right Kidney: Length: 10.1 cm. Echogenicity is increased. Renal cortical thickness is normal. No mass, perinephric fluid, or hydronephrosis visualized. No sonographically demonstrable calculus or ureterectasis. Left Kidney: Length: 10.8 cm. Echogenicity is slightly increased. Renal cortical thickness is normal. No perinephric fluid or hydronephrosis visualized. There is a predominantly cystic mass arising from the mid left kidney measuring 3.6 x 3.0 x 3.0 cm. There is a focal area of increased echogenicity along the periphery of this cystic  lesion. There is an extrarenal pelvis on the left. No sonographically demonstrable calculus or ureterectasis. Bladder: Appears normal for degree of bladder distention. Prostate measures 5 x 3 cm. Prostate is mildly inhomogeneous in appearance. There is a right pleural effusion. IMPRESSION: Each kidney is echogenic, a finding that may be seen with medical renal disease. There is no hydronephrosis on either side. There is an extrarenal pelvis on the left. Complex left cystic mass within an echogenic focus along the periphery of this mass. This mass warrants additional surveillance given the echogenic focus along the periphery. Further evaluation with pre and post contrast MRI or CT should be considered. MRI is preferred in younger patients (due to lack of ionizing radiation) and for evaluating calcified lesion(s). Prostate mildly prominent and mildly inhomogeneous in appearance. This finding warrants correlation with PSA and clinical examination. Right pleural  effusion. Electronically Signed   By: Lowella Grip III M.D.   On: 01/04/2017 16:03   Scheduled Meds: . atorvastatin  20 mg Oral Daily  . carvedilol  6.25 mg Oral BID WC  . enoxaparin (LOVENOX) injection  30 mg Subcutaneous Q24H  . fentaNYL  12.5 mcg Transdermal Q72H  . ipratropium-albuterol  3 mL Nebulization TID  . multivitamin with minerals  1 tablet Oral Daily  . sodium chloride flush  3 mL Intravenous Q12H  . sodium chloride flush  3 mL Intravenous Q12H  . sodium chloride flush  3 mL Intravenous Q12H   Continuous Infusions: . sodium chloride    . sodium chloride    . sodium chloride 10 mL/hr at 01/04/17 0522  . sodium chloride    . sodium chloride 10 mL/hr at 01/05/17 6837     LOS: 3 days   Chipper Oman, MD Pager: Text Page via [email or web address]  [phone]  If 7PM-7AM, please contact night-coverage [email or web address] Password [facility] 01/05/2017, 11:27 AM

## 2017-01-05 NOTE — Progress Notes (Signed)
Site area: Right groin a 7 french venous sheath was removed  Site Prior to Removal:  Level 0  Pressure Applied For 15 MINUTES    Bedrest  Beginning at 1720p  Manual:   Yes.    Patient Status During Pull:  stable  Post Pull Groin Site:  Level 0  Post Pull Instructions Given:  Yes.    Post Pull Pulses Present:  Yes.    Dressing Applied:  Yes.    Comments:  VS remain stable during sheath pull.

## 2017-01-05 NOTE — H&P (View-Only) (Signed)
Progress Note  Patient Name: Collin Young Date of Encounter: 01/05/2017  Primary Cardiologist: Dr Oval Linsey (new)  Subjective   Up in chair. SOB was improved with O2.   Inpatient Medications    Scheduled Meds: . atorvastatin  20 mg Oral Daily  . carvedilol  6.25 mg Oral BID WC  . enoxaparin (LOVENOX) injection  30 mg Subcutaneous Q24H  . fentaNYL  12.5 mcg Transdermal Q72H  . ipratropium-albuterol  3 mL Nebulization TID  . multivitamin with minerals  1 tablet Oral Daily  . sodium chloride flush  3 mL Intravenous Q12H  . sodium chloride flush  3 mL Intravenous Q12H  . sodium chloride flush  3 mL Intravenous Q12H   Continuous Infusions: . sodium chloride    . sodium chloride    . sodium chloride 10 mL/hr at 01/04/17 0522  . sodium chloride    . sodium chloride 10 mL/hr at 01/05/17 8185   PRN Meds: sodium chloride, sodium chloride, sodium chloride, acetaminophen, albuterol, ondansetron (ZOFRAN) IV, polyethylene glycol, sodium chloride flush, sodium chloride flush, sodium chloride flush   Vital Signs    Vitals:   01/04/17 1541 01/04/17 2048 01/05/17 0432 01/05/17 0828  BP: 120/71 116/62 139/83   Pulse: 92 97 (!) 102   Resp: 18 20    Temp: 97.4 F (36.3 C) 98.9 F (37.2 C) 98 F (36.7 C)   TempSrc: Axillary Oral Oral   SpO2: 100% 98% 100% 99%  Weight:   119 lb 12.8 oz (54.3 kg)   Height:        Intake/Output Summary (Last 24 hours) at 01/05/17 0837 Last data filed at 01/05/17 0600  Gross per 24 hour  Intake              870 ml  Output             1000 ml  Net             -130 ml   Filed Weights   01/03/17 0544 01/04/17 0532 01/05/17 0432  Weight: 117 lb 8 oz (53.3 kg) 118 lb 9.6 oz (53.8 kg) 119 lb 12.8 oz (54.3 kg)    Telemetry    NSR - Personally Reviewed  ECG    01/02/17- NSR, Q V2, LVH- Personally Reviewed  Physical Exam   GEN: No acute distress. cachectic, on O2  Neck: No JVD Cardiac: RRR, soft systolic murmur LSB, no rubs, or gallops.    Respiratory: decreased overall, scattered rhonchi GI: Soft, nontender, non-distended  MS: No edema; No deformity. Neuro:  Nonfocal  Psych: Normal affect   Labs    Chemistry  Recent Labs Lab 01/02/17 0658 01/03/17 0508 01/04/17 0511 01/05/17 0625  NA 135 136 133* 131*  K 4.2 4.4 4.5 4.6  CL 101 96* 92* 89*  CO2 20* 29 30 31   GLUCOSE 168* 118* 109* 119*  BUN 29* 33* 41* 46*  CREATININE 1.54* 1.70* 1.68* 1.72*  CALCIUM 9.1 9.6 9.0 8.9  PROT 7.1  --   --   --   ALBUMIN 3.9  --   --   --   AST 39  --   --   --   ALT 25  --   --   --   ALKPHOS 37*  --   --   --   BILITOT 0.4  --   --   --   GFRNONAA 45* 40* 41* 40*  GFRAA 53* 47* 47* 46*  ANIONGAP 14 11  11 11     Hematology  Recent Labs Lab 01/02/17 0658  WBC 3.1*  RBC 4.52  HGB 13.1  HCT 39.3  MCV 86.9  MCH 29.0  MCHC 33.3  RDW 14.3  PLT 192    Cardiac EnzymesNo results for input(s): TROPONINI in the last 168 hours.   Recent Labs Lab 01/02/17 0713  TROPIPOC 0.04     BNP  Recent Labs Lab 01/02/17 0658 01/05/17 0625  BNP 1,999.4* 2,182.6*     DDimer No results for input(s): DDIMER in the last 168 hours.   Radiology    CXR-01/02/17 CHEST  2 VIEW  COMPARISON:  10/09/2015  FINDINGS: Right-sided port extends into the low SVC.  There are pleural effusions bilaterally, new. There is mild interstitial fluid and vascular prominence. Central and basilar airspace opacities are present.  IMPRESSION: Interstitial and alveolar edema. New small pleural effusions. Cannot exclude infectious infiltrates.   Cardiac Studies   Echo 01/03/17- Study Conclusions  - Left ventricle: The cavity size was normal. Wall thickness was   normal. The estimated ejection fraction was in the range of 10%   to 15%. Diffuse hypokinesis. DIastolic function is abnormal,   indeterminant grade. - Aortic valve: There was moderate regurgitation. Valve area (VTI):   1.08 cm^2. Valve area (Vmax): 1.5 cm^2. Valve area  (Vmean): 1.5   cm^2. - Mitral valve: There was moderate regurgitation. - Left atrium: The atrium was mildly dilated. - Right ventricle: Systolic function was mildly to moderately   reduced. - Tricuspid valve: There was moderate regurgitation. - Pulmonary arteries: Systolic pressure was moderately increased.   PA peak pressure: 49 mm Hg (S).    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 He is s/p chemotherapy and radiation 2015 (remission). He was admitted 01/02/17 with shortness of breath and found to have a BNP of 2k and an EF of 10-15% by echo. Previous EF was normal by Myoview in 2011.  Assessment & Plan    Acute systolic CHF- New for him. He diuresed 2.0L after a coupled of doses of Lasix. BNP actually higher than on admission- 2186 today.   Cardiomyopathy- Etiology not yet determined (suspect NICM with diffuse HK)  Acute renal insufficiency It appears his baseline SCr is 1-1.2. SCr 1.7 today-  H/O sinus and esophageal CA-  Treated with chemo and radiation. Followed by Dr Marin Olp.  Plan- Discussed with Dr Meda Coffee- plan on cors only today. Will keep at [facility] for CHF consult as well.  Signed, Kerin Ransom, PA-C  01/05/2017, 8:37 AM    The patient was seen, examined and discussed with Kerin Ransom, PA-C and I agree with the above.   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 He is s/p chemotherapy and radiation 2015, now in remission for both. He was admitted 01/02/17 with shortness of breath and found to have a BNP of 2k and an EF of 10-15% by echo. Previous EF was normal by Myoview in 2011. The patient diuresed 2 L, negative 3 lbs. He is now euvolemic. Wheezing has improved. Crea still 1.7, hold lasix today, added breathing treatments   Plan: - right and diagnostic left cath today, if intervention needed  we can stage it as he has CKD stage 3 -  Continue ASA 81 mg po daily, coreg, atorvastatin, no ACEI/ARB  given CKD stage 3.  Ena Dawley, MD 01/05/2017

## 2017-01-05 NOTE — Progress Notes (Signed)
Pt received from cath lab. R radial and R groin site level zero. Telemetry applied, CCMD notified. Call bell within reach. VSS. Family at bedside. Pt and family oriented to room and equipment.   Fritz Pickerel, RN

## 2017-01-06 ENCOUNTER — Encounter (HOSPITAL_COMMUNITY): Payer: Self-pay | Admitting: Cardiovascular Disease

## 2017-01-06 LAB — BASIC METABOLIC PANEL
Anion gap: 13 (ref 5–15)
BUN: 42 mg/dL — ABNORMAL HIGH (ref 6–20)
CHLORIDE: 90 mmol/L — AB (ref 101–111)
CO2: 29 mmol/L (ref 22–32)
CREATININE: 1.72 mg/dL — AB (ref 0.61–1.24)
Calcium: 8.9 mg/dL (ref 8.9–10.3)
GFR calc non Af Amer: 40 mL/min — ABNORMAL LOW (ref >60)
GFR, EST AFRICAN AMERICAN: 46 mL/min — AB (ref >60)
Glucose, Bld: 119 mg/dL — ABNORMAL HIGH (ref 65–99)
POTASSIUM: 5.1 mmol/L (ref 3.5–5.1)
Sodium: 132 mmol/L — ABNORMAL LOW (ref 135–145)

## 2017-01-06 LAB — CBC
HEMATOCRIT: 42 % (ref 39.0–52.0)
HEMOGLOBIN: 13.8 g/dL (ref 13.0–17.0)
MCH: 28.3 pg (ref 26.0–34.0)
MCHC: 32.9 g/dL (ref 30.0–36.0)
MCV: 86.1 fL (ref 78.0–100.0)
Platelets: 189 10*3/uL (ref 150–400)
RBC: 4.88 MIL/uL (ref 4.22–5.81)
RDW: 13.6 % (ref 11.5–15.5)
WBC: 8 10*3/uL (ref 4.0–10.5)

## 2017-01-06 MED ORDER — SALINE SPRAY 0.65 % NA SOLN
1.0000 | NASAL | Status: DC | PRN
  Filled 2017-01-06: qty 44

## 2017-01-06 MED ORDER — MUSCLE RUB 10-15 % EX CREA
1.0000 "application " | TOPICAL_CREAM | CUTANEOUS | Status: DC | PRN
  Filled 2017-01-06: qty 85

## 2017-01-06 MED ORDER — POLYVINYL ALCOHOL 1.4 % OP SOLN
1.0000 [drp] | OPHTHALMIC | Status: DC | PRN
  Filled 2017-01-06: qty 15

## 2017-01-06 MED ORDER — SODIUM CHLORIDE 0.9 % IV SOLN
INTRAVENOUS | Status: DC
  Administered 2017-01-06: 18:00:00 via INTRAVENOUS

## 2017-01-06 MED ORDER — LIP MEDEX EX OINT
1.0000 "application " | TOPICAL_OINTMENT | CUTANEOUS | Status: DC | PRN
  Filled 2017-01-06: qty 7

## 2017-01-06 MED ORDER — LORAZEPAM 0.5 MG PO TABS
0.5000 mg | ORAL_TABLET | Freq: Once | ORAL | Status: AC
Start: 2017-01-06 — End: 2017-01-06
  Administered 2017-01-06: 0.5 mg via ORAL
  Filled 2017-01-06: qty 1

## 2017-01-06 MED ORDER — PHENOL 1.4 % MT LIQD
1.0000 | OROMUCOSAL | Status: DC | PRN
  Administered 2017-01-06 – 2017-01-09 (×2): 1 via OROMUCOSAL
  Filled 2017-01-06 (×2): qty 177

## 2017-01-06 MED ORDER — BLISTEX MEDICATED EX OINT
TOPICAL_OINTMENT | CUTANEOUS | Status: DC | PRN
  Filled 2017-01-06: qty 6.3

## 2017-01-06 MED ORDER — GUAIFENESIN-DM 100-10 MG/5ML PO SYRP
5.0000 mL | ORAL_SOLUTION | ORAL | Status: DC | PRN
  Administered 2017-01-11: 5 mL via ORAL
  Filled 2017-01-06: qty 5

## 2017-01-06 NOTE — Progress Notes (Signed)
PROGRESS NOTE [facility]   STARSKY NANNA   RFF:[id number] DOB: 1950-10-21  DOA: 01/02/2017 PCP: [name], MD   Brief Narrative:  66 year old male with medical history significant for hypertension, squamous cell carcinoma of the esophagus and left piriform sinus in remission status post chemotherapy and radiation therapy about 3 years ago. He presented to the emergency department complaining of progressive shortness of breath for the past several weeks. He was admitted with concerns of CHF, with BNP significantly elevated at 2000. Echocardiogram revealed ejection fraction of 10-15% patient was started on beta blockers Lasix and Aldactone. Patient also on admission was found to be on acute kidney injury. Cardiology consulted, underwent cardiac cath showing significant  Multivessel disease. Recommended stage PCI in the next 1 to 2 days when creatinine is improving.   Subjective: Patient seen and examined, no complaints this morning, .    Assessment & Plan: Cardiomyopathy/Acute systolic congestive heart failure - new diagnosis Unclear etiology at this time ? Radiation/Chemo therapy vs CAD  Echo shows ejection fraction of 10-15% with diffuse hypokinesis, diastolic dfx  Continue coreg 6.25 mg titrated as tolerated  ACE and aldactone on hold due to AKI   Lasix continues to be on hold per cardiology  Pt underwent cardiac cath, on 5/8 showing severe calcific disease in the proximal to mid RCA, chronic total occlusion of the proximal circumflex, mod left main and mid LAD stenosis. Plan for stage PCI in the next couple of days. Continue tele monitor  Daily weight, I&O's low salt diet   AKI - suspect cardiorenal syndrome Poor CO leading to poor kidney perfusion. - FeNA 0.64% suggestive of prerenal etiology  Holding Lasix per cardiology recommendations,s.  Renal US show complex left cystic mass, warrants additional surveillance with MRI or CT with contrast will defer after patient  stable given patient will receive multiple contrast doses for cardiac cath. This can be follow as outpatient as well.  Started him on gentle hydration.   Enlarge prostate  Asymptomatic  PSA pending.  Consider urological evaluation if PSA elevated   Hypertension - stable  Per patient was taken off his BP meds by PCP PTA  Now on BB  Monitor BP   Squamous Cell Ca of the left piriform sinus and esophagus  In remission  Continue home pain med - Fentanyl patch.  DVT prophylaxis: Lovenox  Code Status: FULL  Family Communication: none at  bedside  Disposition Plan: pending further evaluation.   Consultants:   Cardiology   Procedures:   ECHO 01/03/17 ------------------------------------------------------------------- Study Conclusions  - Left ventricle: The cavity size was normal. Wall thickness was   normal. The estimated ejection fraction was in the range of 10%   to 15%. Diffuse hypokinesis. DIastolic function is abnormal,   indeterminant grade. - Aortic valve: There was moderate regurgitation. Valve area (VTI):   1.08 cm^2. Valve area (Vmax): 1.5 cm^2. Valve area (Vmean): 1.5   cm^2. - Mitral valve: There was moderate regurgitation. - Left atrium: The atrium was mildly dilated. - Right ventricle: Systolic function was mildly to moderately   reduced. - Tricuspid valve: There was moderate regurgitation. - Pulmonary arteries: Systolic pressure was moderately increased.   PA peak pressure: 49 mm Hg (S).  Antimicrobials: Anti-infectives    None       Objective: Vitals:   01/06/17 0513 01/06/17 0816 01/06/17 0943 01/06/17 1400  BP: (!) 146/87 101/77  100/73  Pulse: 90 100  76  Resp: 20   17  Temp: 97.8  F (36.6 C)   97.8 F (36.6 C)  TempSrc: Rectal   Oral  SpO2: 100%  100% 99%  Weight: 53.4 kg (117 lb 11.2 oz)     Height:        Intake/Output Summary (Last 24 hours) at 01/06/17 1450 Last data filed at 01/06/17 1300  Gross per 24 hour  Intake               480 ml  Output              575 ml  Net              -95 ml   Filed Weights   01/04/17 0532 01/05/17 0432 01/06/17 0513  Weight: 53.8 kg (118 lb 9.6 oz) 54.3 kg (119 lb 12.8 oz) 53.4 kg (117 lb 11.2 oz)    Examination:  General exam: Frail, calm in good spirits on 2 lit [place] oxygen.  Respiratory system: Good air entry, no crackles appreciated  Cardiovascular system: D7O2 RRR 2/6 systolic murmurs + S3 no JVD   Gastrointestinal system: Abd Soft NTND Extremities: No pedal edema  Skin: No lesions or rash   Data Reviewed: I have personally reviewed following labs and imaging studies  CBC:  Recent Labs Lab 01/02/17 0658 01/06/17 0423  WBC 3.1* 8.0  NEUTROABS 2.1  --   HGB 13.1 13.8  HCT 39.3 42.0  MCV 86.9 86.1  PLT 192 423   Basic Metabolic Panel:  Recent Labs Lab 01/02/17 0658 01/03/17 0508 01/04/17 0511 01/05/17 0625 01/06/17 0423  NA 135 136 133* 131* 132*  K 4.2 4.4 4.5 4.6 5.1  CL 101 96* 92* 89* 90*  CO2 20* 29 30 31 29   GLUCOSE 168* 118* 109* 119* 119*  BUN 29* 33* 41* 46* 42*  CREATININE 1.54* 1.70* 1.68* 1.72* 1.72*  CALCIUM 9.1 9.6 9.0 8.9 8.9   GFR: Estimated Creatinine Clearance: 31.9 mL/min (A) (by C-G formula based on SCr of 1.72 mg/dL (H)). Liver Function Tests:  Recent Labs Lab 01/02/17 0658  AST 39  ALT 25  ALKPHOS 37*  BILITOT 0.4  PROT 7.1  ALBUMIN 3.9   No results found for this or any previous visit (from the past 240 hour(s)).    Radiology Studies: US Renal  Result Date: 01/04/2017 CLINICAL DATA:  Acute renal insufficiency.  Hypertension. EXAM: RENAL / URINARY TRACT ULTRASOUND COMPLETE COMPARISON:  None. FINDINGS: Right Kidney: Length: 10.1 cm. Echogenicity is increased. Renal cortical thickness is normal. No mass, perinephric fluid, or hydronephrosis visualized. No sonographically demonstrable calculus or ureterectasis. Left Kidney: Length: 10.8 cm. Echogenicity is slightly increased. Renal cortical thickness is normal. No  perinephric fluid or hydronephrosis visualized. There is a predominantly cystic mass arising from the mid left kidney measuring 3.6 x 3.0 x 3.0 cm. There is a focal area of increased echogenicity along the periphery of this cystic lesion. There is an extrarenal pelvis on the left. No sonographically demonstrable calculus or ureterectasis. Bladder: Appears normal for degree of bladder distention. Prostate measures 5 x 3 cm. Prostate is mildly inhomogeneous in appearance. There is a right pleural effusion. IMPRESSION: Each kidney is echogenic, a finding that may be seen with medical renal disease. There is no hydronephrosis on either side. There is an extrarenal pelvis on the left. Complex left cystic mass within an echogenic focus along the periphery of this mass. This mass warrants additional surveillance given the echogenic focus along the periphery. Further evaluation with pre and post contrast MRI  or CT should be considered. MRI is preferred in younger patients (due to lack of ionizing radiation) and for evaluating calcified lesion(s). Prostate mildly prominent and mildly inhomogeneous in appearance. This finding warrants correlation with PSA and clinical examination. Right pleural effusion. Electronically Signed   By: Lowella Grip III M.D.   On: 01/04/2017 16:03   Scheduled Meds: . atorvastatin  20 mg Oral Daily  . carvedilol  6.25 mg Oral BID WC  . fentaNYL  12.5 mcg Transdermal Q72H  . ipratropium-albuterol  3 mL Nebulization TID  . multivitamin with minerals  1 tablet Oral Daily  . sodium chloride flush  3 mL Intravenous Q12H  . sodium chloride flush  3 mL Intravenous Q12H   Continuous Infusions: . sodium chloride    . sodium chloride       LOS: 4 days   Kawehi Hostetter, MD Pager: Text Page via [email or web address]  [phone]  If 7PM-7AM, please contact night-coverage [email or web address] Password [email or web address] 01/06/2017, 2:50 PM

## 2017-01-06 NOTE — Care Management Important Message (Signed)
Important Message  Patient Details  Name: Collin Young MRN: [id number] Date of Birth: 1951-06-12   Medicare Important Message Given:  Yes    [name] Abena 01/06/2017, 11:20 AM

## 2017-01-07 ENCOUNTER — Inpatient Hospital Stay (HOSPITAL_COMMUNITY): Payer: Medicare Other

## 2017-01-07 DIAGNOSIS — I5021 Acute systolic (congestive) heart failure: Secondary | ICD-10-CM

## 2017-01-07 DIAGNOSIS — C12 Malignant neoplasm of pyriform sinus: Secondary | ICD-10-CM

## 2017-01-07 DIAGNOSIS — J9601 Acute respiratory failure with hypoxia: Secondary | ICD-10-CM

## 2017-01-07 DIAGNOSIS — I5041 Acute combined systolic (congestive) and diastolic (congestive) heart failure: Secondary | ICD-10-CM

## 2017-01-07 LAB — BASIC METABOLIC PANEL
ANION GAP: 19 — AB (ref 5–15)
BUN: 52 mg/dL — ABNORMAL HIGH (ref 6–20)
CHLORIDE: 89 mmol/L — AB (ref 101–111)
CO2: 19 mmol/L — AB (ref 22–32)
Calcium: 8.5 mg/dL — ABNORMAL LOW (ref 8.9–10.3)
Creatinine, Ser: 2.01 mg/dL — ABNORMAL HIGH (ref 0.61–1.24)
GFR calc non Af Amer: 33 mL/min — ABNORMAL LOW (ref >60)
GFR, EST AFRICAN AMERICAN: 38 mL/min — AB (ref >60)
Glucose, Bld: 123 mg/dL — ABNORMAL HIGH (ref 65–99)
POTASSIUM: 5.1 mmol/L (ref 3.5–5.1)
Sodium: 127 mmol/L — ABNORMAL LOW (ref 135–145)

## 2017-01-07 LAB — BLOOD GAS, ARTERIAL
ACID-BASE DEFICIT: 4.1 mmol/L — AB (ref 0.0–2.0)
BICARBONATE: 18.9 mmol/L — AB (ref 20.0–28.0)
DRAWN BY: 213381
FIO2: 30
O2 SAT: 92.7 %
PATIENT TEMPERATURE: 98.6
PH ART: 7.477 — AB (ref 7.350–7.450)
pCO2 arterial: 25.9 mmHg — ABNORMAL LOW (ref 32.0–48.0)
pO2, Arterial: 70.1 mmHg — ABNORMAL LOW (ref 83.0–108.0)

## 2017-01-07 LAB — COOXEMETRY PANEL
Carboxyhemoglobin: 1.1 % (ref 0.5–1.5)
Methemoglobin: 0.7 % (ref 0.0–1.5)
O2 Saturation: 48.1 %
Total hemoglobin: 12.6 g/dL (ref 12.0–16.0)

## 2017-01-07 LAB — PHOSPHORUS: Phosphorus: 2.8 mg/dL (ref 2.5–4.6)

## 2017-01-07 LAB — MAGNESIUM: Magnesium: 2.5 mg/dL — ABNORMAL HIGH (ref 1.7–2.4)

## 2017-01-07 LAB — MRSA PCR SCREENING: MRSA BY PCR: NEGATIVE

## 2017-01-07 LAB — PROCALCITONIN: Procalcitonin: 10.85 ng/mL

## 2017-01-07 MED ORDER — IPRATROPIUM-ALBUTEROL 0.5-2.5 (3) MG/3ML IN SOLN
3.0000 mL | Freq: Four times a day (QID) | RESPIRATORY_TRACT | Status: DC
  Administered 2017-01-07 – 2017-01-08 (×5): 3 mL via RESPIRATORY_TRACT
  Filled 2017-01-07 (×4): qty 3

## 2017-01-07 MED ORDER — SODIUM CHLORIDE 0.9% FLUSH
10.0000 mL | INTRAVENOUS | Status: DC | PRN
  Administered 2017-01-11: 10 mL
  Filled 2017-01-07: qty 40

## 2017-01-07 MED ORDER — BUDESONIDE 0.5 MG/2ML IN SUSP
0.5000 mg | Freq: Two times a day (BID) | RESPIRATORY_TRACT | Status: DC
  Administered 2017-01-07 – 2017-01-17 (×20): 0.5 mg via RESPIRATORY_TRACT
  Filled 2017-01-07 (×20): qty 2

## 2017-01-07 MED ORDER — VANCOMYCIN HCL IN DEXTROSE 1-5 GM/200ML-% IV SOLN
1000.0000 mg | Freq: Once | INTRAVENOUS | Status: AC
  Administered 2017-01-07: 1000 mg via INTRAVENOUS
  Filled 2017-01-07: qty 200

## 2017-01-07 MED ORDER — FUROSEMIDE 10 MG/ML IJ SOLN
40.0000 mg | Freq: Once | INTRAMUSCULAR | Status: AC
  Administered 2017-01-07: 40 mg via INTRAVENOUS
  Filled 2017-01-07: qty 4

## 2017-01-07 MED ORDER — FUROSEMIDE 10 MG/ML IJ SOLN
20.0000 mg | Freq: Two times a day (BID) | INTRAMUSCULAR | Status: AC
  Administered 2017-01-07 – 2017-01-08 (×2): 20 mg via INTRAVENOUS
  Filled 2017-01-07 (×2): qty 2

## 2017-01-07 MED ORDER — DEXTROSE 5 % IV SOLN
1.0000 g | INTRAVENOUS | Status: DC
  Administered 2017-01-07 – 2017-01-09 (×3): 1 g via INTRAVENOUS
  Filled 2017-01-07 (×4): qty 1

## 2017-01-07 MED ORDER — NITROGLYCERIN IN D5W 200-5 MCG/ML-% IV SOLN
2.0000 ug/min | INTRAVENOUS | Status: DC
  Administered 2017-01-07: 5 ug/min via INTRAVENOUS
  Administered 2017-01-09 – 2017-01-11 (×2): 16.667 ug/min via INTRAVENOUS
  Administered 2017-01-13: 8 ug/min via INTRAVENOUS
  Filled 2017-01-07 (×4): qty 250

## 2017-01-07 MED ORDER — VANCOMYCIN HCL 500 MG IV SOLR
500.0000 mg | INTRAVENOUS | Status: DC
  Administered 2017-01-08: 500 mg via INTRAVENOUS
  Filled 2017-01-07 (×2): qty 500

## 2017-01-07 NOTE — Consult Note (Signed)
Name: Collin Young MRN: [id number] DOB: Aug 07, 1951    ADMISSION DATE:  01/02/2017 CONSULTATION DATE:  01/07/2017  REFERRING MD :  Dr. 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   Presents to ED on 5/5 with progressive dyspnea for the last several weeks. BNP 2000, Creatinine 1.5, WBC 3.1. CXR significant for pulmonary edema. ECHO (Previous with normal EF) EF 10-15 with diffuse hypokinesis and moderate aortic regurgitation. Treated with IV lasix. Cardiology was consulted and patient underwent right heart cath on 5/18 in which revealed multivessel CAD with plans to do PCI of RCA next week if renal function is stable.   On 5/10 patient became tachypneic and hypoxic, given 40 mg IV lasix with 370ml output, with improvement. Patient transferred to Step-down for further monitoring. PCCM asked to consult.   SIGNIFICANT EVENTS  5/5 > Admission  5/8 > Heart Cath  5/10 > Transfer to Step-Down   STUDIES:  CXR 5/5 > Interstitial and alveolar edema, new small pleural effusions US renal 5/7 > Complex left cystic mass, mildly prominent and mildly inhomogeneous prostate  CXR 5/10 > Worsening airspace opacities compatible with PNA or asymmetric pulmonary edema, mild cardiomegaly without vascular congestion    PAST MEDICAL HISTORY :   has a past medical history of Cancer ([place]); GERD (gastroesophageal reflux disease); Headache; History of radiation therapy (10/19/2013-12/05/2013); HOH (hard of hearing); Hyperlipemia; Hypertension; and Seizures ([place]).  has a past surgical history that includes No past surgeries; Cardiovascular stress test (10/09/09); Laryngoscopy (N/A, 09/15/2013); and Right/Left Heart Cath and Coronary Angiography (N/A,  01/05/2017). Prior to Admission medications   Medication Sig Start Date End Date Taking? Authorizing Provider  atorvastatin (LIPITOR) 20 MG tablet Take 20 mg by mouth daily. 12/30/16  Yes [provider]  fentaNYL (DURAGESIC - DOSED MCG/HR) 12 MCG/HR Place 1 patch every 3 days to the skin 12/22/16  Yes Ennever, Rudell Cobb, MD  Multiple Vitamin (MULTIVITAMIN WITH MINERALS) TABS tablet Take 1 tablet by mouth daily.   Yes [provider]   No Known Allergies  FAMILY HISTORY:  family history is not on file. SOCIAL HISTORY:  reports that he quit smoking about 17 years ago. His smoking use included Cigarettes. He started smoking about 56 years ago. He has a 40.00 pack-year smoking history. He quit smokeless tobacco use about 17 years ago. His smokeless tobacco use included Chew. He reports that he does not drink alcohol or use drugs.  REVIEW OF SYSTEMS:   All negative; except for those that are bolded, which indicate positives.  Constitutional: weight loss, weight gain, night sweats, fevers, chills, fatigue, weakness.  HEENT: headaches, sore throat, sneezing, nasal congestion, post nasal drip, difficulty swallowing, tooth/dental problems, visual complaints, visual changes, ear aches. Neuro: difficulty with speech, weakness, numbness, ataxia. CV:  chest pain, orthopnea, PND, swelling in lower extremities, dizziness, palpitations, syncope.  Resp: cough, hemoptysis, dyspnea, wheezing. GI: heartburn, indigestion, abdominal pain, nausea, vomiting, diarrhea, constipation, change in bowel habits, loss of appetite, hematemesis, melena, hematochezia.  GU: dysuria, change in color of urine, urgency or frequency, flank pain, hematuria. MSK: joint pain or swelling, decreased range of motion. Psych: change in mood or affect, depression, anxiety, suicidal ideations, homicidal ideations. Skin: rash, itching, bruising.  SUBJECTIVE:  Sitting in bed. Denies dyspnea and pain. States productive yellow  sputum production     VITAL SIGNS: Temp:  [97.9 F (36.6 C)-99.2 F (37.3 C)] 98.9 F (37.2 C) (05/10 1526) Pulse Rate:  [87-95] 88 (05/10 1526) Resp:  [18-44] 44 (05/10 1526) BP: (137-158)/(33-83) 148/80 (05/10 1526) SpO2:  [95 %-100 %] 95 % (05/10 1526) FiO2 (%):  [31 %] 31 % (05/10 1526) Weight:  [54.7 kg (120 lb 11.2 oz)] 54.7 kg (120 lb 11.2 oz) (05/10 0616)  PHYSICAL EXAMINATION: General:  Adult male, no distress  Neuro:  Alert, oriented, follows commands  HEENT:  Normocephalic  Cardiovascular:  RRR, no MRG, NI S1/S2  Lungs:  Diminished breath sounds, no wheeze Abdomen:  Non-tender, non-distended, active bowel sounds  Musculoskeletal:  No edema  Skin:  Warm, dry, intact    Recent Labs Lab 01/05/17 0625 01/06/17 0423 01/07/17 0214  NA 131* 132* 127*  K 4.6 5.1 5.1  CL 89* 90* 89*  CO2 31 29 19*  BUN 46* 42* 52*  CREATININE 1.72* 1.72* 2.01*  GLUCOSE 119* 119* 123*    Recent Labs Lab 01/02/17 0658 01/06/17 0423  HGB 13.1 13.8  HCT 39.3 42.0  WBC 3.1* 8.0  PLT 192 189   Dg Chest 2 View  Result Date: 01/07/2017 CLINICAL DATA:  Street shortness of breath, follow-up from chest x-ray of Jan 02, 2017 at which time pulmonary edema or less likely pneumonia was suspected. EXAM: CHEST  2 VIEW COMPARISON:  PA and lateral chest x-ray of Jan 02, 2017 FINDINGS: The lungs are adequately inflated. There are increased confluent alveolar opacities in the right mid and lower lung and in the left mid and lower lung. The interstitial markings are mildly increased. The cardiac silhouette remains enlarged. There is no significant left pleural effusion. There is a small right pleural effusion. There is calcification in the wall of the aortic arch. The power port catheter tip projects over the junction of the middle and distal thirds of the SVC. IMPRESSION: Worsening of airspace opacities compatible with pneumonia or asymmetric pulmonary edema though I favor the former. Mild cardiomegaly  without pulmonary vascular congestion. Thoracic aortic atherosclerosis. Electronically Signed   By: David  Martinique M.D.   On: 01/07/2017 14:06    ASSESSMENT / PLAN:  Acute Hypoxic Respiratory Failure in setting volume overload vs PNA Plan -Maintain Oxygenation >92 -Pulmonary Hygiene  -BIPAP PRN -Scheduled Duoneb   Acute on Chronic systolic heart failure  Plan -Cardiac monitoring  -Diuresis per Cardiology   ?PNA Plan -Trend WBC and Fever curve  -Trend Procal  -Continue Cefepime and Vancomycin  -Send Sputum Culture   Acute on Chronic Renal Disease (Bas Crt 1.2-1.3) Plan  -Trend BMP -Replace electrolytes as needed -Avoid Nephrotoxic Medications   Remaining care per primary team  Hayden Pedro, AGACNP-BC [place]  Pgr: [phone]  PCCM Pgr: [phone]    ATTENDING NOTE / ATTESTATION NOTE :   I have discussed the case with the resident/APP  Hayden Pedro NP.   I agree with the resident/APP's  history, physical examination, assessment, and plans.    I have edited the above note and modified it according to our agreed history, physical examination, assessment and plan.   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   Presents to  ED on 5/5 with progressive dyspnea for the last several weeks. BNP 2000, Creatinine 1.5, WBC 3.1. CXR significant for pulmonary edema. ECHO (Previous with normal EF) EF 10-15 with diffuse hypokinesis and moderate aortic regurgitation. Treated with IV lasix. Cardiology was consulted and patient underwent right heart cath on 5/18 in which revealed multivessel CAD with plans to do PCI of RCA next week if renal function is stable.   On 5/10 patient became tachypneic and hypoxic, given 40 mg IV lasix with 332ml output, with  improvement. Patient transferred to Step-down for further monitoring. PCCM asked to consult. \  Pt with chronic hoarseness related to esophageal CA and radiation.  Feels better SOB compared to this am.   Vitals:  Vitals:   01/07/17 1446 01/07/17 1456 01/07/17 1526 01/07/17 1720  BP:   (!) 148/80 (!) 144/78  Pulse:   88 92  Resp: (!) 28  (!) 44 18  Temp:   98.9 F (37.2 C) 98.6 F (37 C)  TempSrc:   Oral Oral  SpO2: 98% 98% 95% 94%  Weight:      Height:        Constitutional/General:  Chronically ill appearing, in mild distress on VM, has hoarseness and did not have his hearing aids on  Body mass index is 19.48 kg/m. Wt Readings from Last 3 Encounters:  01/07/17 54.7 kg (120 lb 11.2 oz)  11/03/16 59.4 kg (131 lb)  05/11/16 55.8 kg (123 lb)    HEENT: PERLA, anicteric sclerae. (-) Oral thrush.   Neck: No masses. Midline trachea. No JVD, (-) LAD. (-) bruits appreciated.  Respiratory/Chest: Grossly normal chest. (-) deformity. (-) Accessory muscle use.  Symmetric expansion. Diminished BS on both lower lung zones. Wheezing in upper lung zones (+) crackles at bases. (-)  rhonchi (-) egophony  Cardiovascular: Regular rate and  rhythm, heart sounds normal, no murmur or gallops,  Trace peripheral edema  Gastrointestinal:  Normal bowel sounds. Soft, non-tender. No hepatosplenomegaly.  (-) masses.   Musculoskeletal:  Normal muscle tone.   Extremities: Grossly normal. (-) clubbing, cyanosis.  (-) edema  Skin: (-) rash,lesions seen.   Neurological/Psychiatric :  CN grossly intact. (-) lateralizing signs.     CBC Recent Labs     01/06/17  0423  WBC  8.0  HGB  13.8  HCT  42.0  PLT  189    Coag's No results for input(s): APTT, INR in the last 72 hours.  BMET Recent Labs     01/05/17  0625  01/06/17  0423  01/07/17  0214  NA  131*  132*  127*  K  4.6  5.1  5.1  CL  89*  90*  89*  CO2  31  29  19*  BUN  46*  42*  52*  CREATININE  1.72*  1.72*  2.01*    GLUCOSE  119*  119*  123*    Electrolytes Recent Labs     01/05/17  0625  01/06/17  0423  01/07/17  0214  CALCIUM  8.9  8.9  8.5*    Sepsis Markers No results for input(s): PROCALCITON, O2SATVEN in the last 72 hours.  Invalid input(s): LACTICACIDVEN  ABG Recent Labs     01/05/17  1604  01/07/17  1605  PHART  7.595*  7.477*  PCO2ART  18.9*  25.9*  PO2ART  108.0  70.1*    Liver Enzymes No results for input(s): AST, ALT, ALKPHOS, BILITOT, ALBUMIN in the last 72 hours.  Cardiac Enzymes  No results for input(s): TROPONINI, PROBNP in the last 72 hours.  Glucose No results for input(s): GLUCAP in the last 72 hours.  Imaging Dg Chest 2 View  Result Date: 01/07/2017 CLINICAL DATA:  Street shortness of breath, follow-up from chest x-ray of Jan 02, 2017 at which time pulmonary edema or less likely pneumonia was suspected. EXAM: CHEST  2 VIEW COMPARISON:  PA and lateral chest x-ray of Jan 02, 2017 FINDINGS: The lungs are adequately inflated. There are increased confluent alveolar opacities in the right mid and lower lung and in the left mid and lower lung. The interstitial markings are mildly increased. The cardiac silhouette remains enlarged. There is no significant left pleural effusion. There is a small right pleural effusion. There is calcification in the wall of the aortic arch. The power port catheter tip projects over the junction of the middle and distal thirds of the SVC. IMPRESSION: Worsening of airspace opacities compatible with pneumonia or asymmetric pulmonary edema though I favor the former. Mild cardiomegaly without pulmonary vascular congestion. Thoracic aortic atherosclerosis. Electronically Signed   By: David  Martinique M.D.   On: 01/07/2017 14:06    Assessment/Plan: Acute on Chronic Hypoxemic  Respiratory Failure 2/2 Acute sCHF exacerbation + Acute Pulm Edema + Severe CAD +/- HCAP +/- Undiagnosed COPD which is mildly flared up - pt is in mild distress on VM and his ABG  did not show severe hypoxemia. Agree with SDU transfer and placing on BiPaP at least overnight and assess in am if we can wean off.  - Will defer diuretics to cardiology. He got 1 dose lasix today. His creatinine is higher today - agree with cefepime and vanc. Agree with panculture and check PCT - If he worsens tonight, he needs to be intubated sooner rather than later - start pulmicort BID + duoneb qid.  Will hold off on IV steroids for now - cardiology is following, planned for PCI tomorrow or next week  AKI, related to diuretics + contrast + possible cardiorenal syndrome - diuretics per cardiology  Esophageal CA, S/P radiation, in remission.  - in remission    I updated daughter [name] and mentioned to her re: SDU transfer.  I mentioned that pt is critically ill given his low EF and multiple vessel CAD.  She understands over all condition and wants him full code for now.     Monica Becton, MD 01/07/2017, 5:36 PM [facility] Pager [phone] After 3 pm or if no answer, call [phone]

## 2017-01-07 NOTE — Progress Notes (Signed)
Pt's daughter [name] is updated on pt's plan of care and the transfer to the step down unit

## 2017-01-07 NOTE — Progress Notes (Signed)
Pharmacy Antibiotic Note  Collin Young is a 66 y.o. male with concern of PNA. Pharmacy has been consulted for  Cefepime and vancomycin dosing. -WBC= 8, afebrile, SCr= 2.01 (baseline ~ 1-1.2) and CrCl ~ 30 -CXR: Worsening of airspace opacities compatible with pneumonia or asymmetric pulmonary edema  Plan: -Vancomycin 1000mg  IV x1 followed by 500mg  IV q24hr -Cefepime 1gm IV q24h -Will follow renal function, cultures and clinical progress   Height: 5\' 6"  (167.6 cm) Weight: 120 lb 11.2 oz (54.7 kg) IBW/kg (Calculated) : 63.8  Temp (24hrs), Avg:98.6 F (37 C), Min:97.9 F (36.6 C), Max:99.2 F (37.3 C)   Recent Labs Lab 01/02/17 0658 01/03/17 0508 01/04/17 0511 01/05/17 0625 01/06/17 0423 01/07/17 0214  WBC 3.1*  --   --   --  8.0  --   CREATININE 1.54* 1.70* 1.68* 1.72* 1.72* 2.01*    Estimated Creatinine Clearance: 28 mL/min (A) (by C-G formula based on SCr of 2.01 mg/dL (H)).    No Known Allergies  Antimicrobials this admission: 5/10 vanc>>  5/10 cefepime>> Dose adjustments this admission:   Microbiology results:   Thank you for allowing pharmacy to be a part of this patient's care.  Hildred Laser, Pharm D 01/07/2017 2:48 PM

## 2017-01-07 NOTE — Progress Notes (Signed)
Order received to transfer pt to a step down unit. RT administered breathing tx. Abx started. Rapid response came to assess the pt. Pt remains orthopneic, dyspneic and tachypneic. Pt appears uncomfortable. Fine crackles auscultated; no wheezes. Has not urinated since receiving the lasix. Will transfer pt as soon as a step bed becomes available.     01/07/17 1526  Vitals  Temp 98.9 F (37.2 C)  Temp Source Oral  BP (!) 148/80  MAP (mmHg) 93  BP Location Right Arm  BP Method Automatic  Patient Position (if appropriate) Sitting  Pulse Rate 88  Pulse Rate Source Dinamap  ECG Heart Rate 88  Cardiac Rhythm NSR;Heart block  Heart Block Type 1st degree AVB  Resp (!) 44  Oxygen Therapy  SpO2 95 %  O2 Device Venturi Mask  O2 Flow Rate (L/min) 4 L/min  FiO2 (%) 31 %  Pulse Oximetry Type Continuous

## 2017-01-07 NOTE — Progress Notes (Signed)
Pt seems more SOB and sleepy. MD notified. CXR and lasix ordered. Continuous IV fluid stopped and pt is getting his breathing treatment as ordered. Will continue to monitor.    01/07/17 1334  Vitals  Temp 99.2 F (37.3 C)  Temp Source Axillary  BP (!) 142/75  BP Location Right Arm  BP Method Automatic  Patient Position (if appropriate) Lying  Pulse Rate 91  Pulse Rate Source Dinamap  Resp 20  Oxygen Therapy  SpO2 100 %  O2 Device Nasal Cannula  O2 Flow Rate (L/min) 3 L/min

## 2017-01-07 NOTE — Progress Notes (Signed)
Progress Note  Patient Name: Collin Young Date of Encounter: 01/07/2017  Primary Cardiologist: New to Dr. Oval Linsey  Subjective   Had respiratory distress this afternoon. Given IV lasix 40mg  x 1 with improved breathing. Now transferring to stepdown. Plan to use BiPAP.   Inpatient Medications    Scheduled Meds: . atorvastatin  20 mg Oral Daily  . carvedilol  6.25 mg Oral BID WC  . fentaNYL  12.5 mcg Transdermal Q72H  . ipratropium-albuterol  3 mL Nebulization TID  . multivitamin with minerals  1 tablet Oral Daily  . sodium chloride flush  3 mL Intravenous Q12H  . sodium chloride flush  3 mL Intravenous Q12H   Continuous Infusions: . sodium chloride    . sodium chloride    . ceFEPime (MAXIPIME) IV 1 g (01/07/17 1536)  . [START ON 01/08/2017] vancomycin    . vancomycin 1,000 mg (01/07/17 1519)   PRN Meds: sodium chloride, sodium chloride, acetaminophen, albuterol, guaiFENesin-dextromethorphan, lip balm, MUSCLE RUB, ondansetron (ZOFRAN) IV, phenol, polyethylene glycol, polyvinyl alcohol, sodium chloride, sodium chloride flush, sodium chloride flush   Vital Signs    Vitals:   01/07/17 1428 01/07/17 1446 01/07/17 1456 01/07/17 1526  BP:    (!) 148/80  Pulse:    88  Resp: (!) 30 (!) 28  (!) 44  Temp:    98.9 F (37.2 C)  TempSrc:    Oral  SpO2: 100% 98% 98% 95%  Weight:      Height:        Intake/Output Summary (Last 24 hours) at 01/07/17 1656 Last data filed at 01/07/17 1625  Gross per 24 hour  Intake           906.66 ml  Output              800 ml  Net           106.66 ml   Filed Weights   01/05/17 0432 01/06/17 0513 01/07/17 0616  Weight: 54.3 kg (119 lb 12.8 oz) 53.4 kg (117 lb 11.2 oz) 54.7 kg (120 lb 11.2 oz)    Telemetry    NSR- Personally Reviewed  ECG    None today   Physical Exam   GEN: mild  acute distress.   Neck: JVD difficult to assess due to body habitate Cardiac: RRR, no murmurs, rubs, or gallops.  Respiratory: faint rales GI:  Soft, nontender, non-distended  MS: No edema; No deformity. Neuro:  Nonfocal  Psych: Normal affect   Labs    Chemistry Recent Labs Lab 01/02/17 0658  01/05/17 0625 01/06/17 0423 01/07/17 0214  NA 135  < > 131* 132* 127*  K 4.2  < > 4.6 5.1 5.1  CL 101  < > 89* 90* 89*  CO2 20*  < > 31 29 19*  GLUCOSE 168*  < > 119* 119* 123*  BUN 29*  < > 46* 42* 52*  CREATININE 1.54*  < > 1.72* 1.72* 2.01*  CALCIUM 9.1  < > 8.9 8.9 8.5*  PROT 7.1  --   --   --   --   ALBUMIN 3.9  --   --   --   --   AST 39  --   --   --   --   ALT 25  --   --   --   --   ALKPHOS 37*  --   --   --   --   BILITOT 0.4  --   --   --   --  GFRNONAA 45*  < > 40* 40* 33*  GFRAA 53*  < > 46* 46* 38*  ANIONGAP 14  < > 11 13 19*  < > = values in this interval not displayed.   Hematology Recent Labs Lab 01/02/17 0658 01/06/17 0423  WBC 3.1* 8.0  RBC 4.52 4.88  HGB 13.1 13.8  HCT 39.3 42.0  MCV 86.9 86.1  MCH 29.0 28.3  MCHC 33.3 32.9  RDW 14.3 13.6  PLT 192 189    Cardiac EnzymesNo results for input(s): TROPONINI in the last 168 hours.  Recent Labs Lab 01/02/17 0713  TROPIPOC 0.04     BNP Recent Labs Lab 01/02/17 0658 01/05/17 0625  BNP 1,999.4* 2,182.6*     DDimer No results for input(s): DDIMER in the last 168 hours.   Radiology    Dg Chest 2 View  Result Date: 01/07/2017 CLINICAL DATA:  Street shortness of breath, follow-up from chest x-ray of Jan 02, 2017 at which time pulmonary edema or less likely pneumonia was suspected. EXAM: CHEST  2 VIEW COMPARISON:  PA and lateral chest x-ray of Jan 02, 2017 FINDINGS: The lungs are adequately inflated. There are increased confluent alveolar opacities in the right mid and lower lung and in the left mid and lower lung. The interstitial markings are mildly increased. The cardiac silhouette remains enlarged. There is no significant left pleural effusion. There is a small right pleural effusion. There is calcification in the wall of the aortic arch.  The power port catheter tip projects over the junction of the middle and distal thirds of the SVC. IMPRESSION: Worsening of airspace opacities compatible with pneumonia or asymmetric pulmonary edema though I favor the former. Mild cardiomegaly without pulmonary vascular congestion. Thoracic aortic atherosclerosis. Electronically Signed   By: David  Martinique M.D.   On: 01/07/2017 14:06    Cardiac Studies   Echo 01/03/17- Study Conclusions  - Left ventricle: The cavity size was normal. Wall thickness was normal. The estimated ejection fraction was in the range of 10% to 15%. Diffuse hypokinesis. DIastolic function is abnormal, indeterminant grade. - Aortic valve: There was moderate regurgitation. Valve area (VTI): 1.08 cm^2. Valve area (Vmax): 1.5 cm^2. Valve area (Vmean): 1.5 cm^2. - Mitral valve: There was moderate regurgitation. - Left atrium: The atrium was mildly dilated. - Right ventricle: Systolic function was mildly to moderately reduced. - Tricuspid valve: There was moderate regurgitation. - Pulmonary arteries: Systolic pressure was moderately increased. PA peak pressure: 49 mm Hg (S).  Cath 01/05/17 Right/Left Heart Cath and Coronary Angiography  Conclusion     Prox RCA to Mid RCA lesion, 99 %stenosed.  Mid RCA lesion, 30 %stenosed.  Dist RCA lesion, 40 %stenosed.  RPDA lesion, 20 %stenosed.  Prox Cx lesion, 100 %stenosed.  Ost Ramus to Ramus lesion, 50 %stenosed.  Ost 3rd Diag to 3rd Diag lesion, 30 %stenosed.  Mid LAD lesion, 40 %stenosed.  Ost LAD to Prox LAD lesion, 20 %stenosed.  Ost LM to LM lesion, 40 %stenosed.  Hemodynamic findings consistent with mild pulmonary hypertension.   1. Severe calcific disease in the proximal to mid RCA 2. 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 Moderate left main and mid LAD stenosis 4. Elevated filling pressures  Recommendations: He has multivessel CAD. I think the RCA can be approached  with PCI but will likely require atherectomy prior to stenting. I would treat the remainder of his CAD medically. Will stage PCI given renal insufficiency. Hydrate gently tonight. May need diuresis tomorrow given  elevated filling pressures. Would anticipate PCI on Thursday or Friday of this week if renal function remains stable.     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 He is s/p chemotherapy and radiation 2015 (remission). He was admitted 01/02/17 with shortness of breath and found to have a BNP of 2k and an EF of 10-15% by echo. Previous EF was normal by My[facility] in 2011.  Assessment & Plan     1. ICM - Cath showed multivessel CAD as noted above. Plan for staged PCI once improved renal function. However renal function worsen to 2.01. Consider ASA.   2. Acute systolic CHF - EF of 10- 61%. Not on schedule dose of lasix. Given IV lasix 40mg  x 1 this after noon. Net I & O -2L. Continue BB. Mild volume overload.Likely need additional daily lasix.   3. Acute renal insufficiency - [place] appears his baseline SCr is 1-1.2. SCr 2.1 today.   4. H/O sinus and esophageal CA-  Treated with chemo and radiation. Followed by Dr Marin Olp.  5. Acute hypoxic respiratory failure - breathing improving after  IV lasix 40mg  x 1. Now transferring to stepdown with BIPAP. Abx started for possible pneumonia.    Dr. Meda Coffee to review medications.  Signed, Leanor Kail, PA  01/07/2017, 4:56 PM     The patient was seen, examined and discussed with Bhagat,Bhavinkumar PA-C and I agree with the above.   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 He is s/p chemotherapy and radiation 2015 (remission). He was admitted 01/02/17 with shortness of breath and found to have a BNP of 2k and an EF of 10-15% by echo. Cardiac cath  showed severe calcific disease in the proximal to mid RCA, chronic total occlusion of the proximal Circumflex, moderate left main and mid LAD stenosis. Plan was for staged PCI once improved renal function. However renal function worsen to 2.01.  This afternoon deteriorated with SOB, appears fluid overloaded on physical exam, CXR showed pneumonia vs worsening CHF, the patient is saturating 97% on non-re breather. PCCM saw him and started on breathing treatments, antibiotics. I will start lasix 20 mg iv BID, start NTG infusion, obtain co-ox, if abnormal start milrinone infusion.   Ena Dawley, MD 01/07/2017

## 2017-01-07 NOTE — Progress Notes (Signed)
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 He presented to the emergency department complaining of progressive shortness of breath for the past several weeks. He was admitted with concerns of CHF, with BNP significantly elevated at 2000. Echocardiogram revealed ejection fraction of 10-15% patient was started on beta blockers Lasix and Aldactone. Patient also on admission was found to be on acute kidney injury. Cardiology consulted, underwent cardiac cath showing significant  Multivessel disease. Recommended stage PCI in the next 1 to 2 days when creatinine improves.   Subjective: Patient seen and examined, pt tachypneic, and sob. Requiring venti mask.  lethargic   Assessment & Plan:  Acute respiratory failure with hypoxia secondary to acute systolic heart failure.  new diagnosis Unclear etiology at this time ? Radiation/Chemo therapy vs CAD  Echo shows ejection fraction of 10-15% with diffuse hypokinesis, diastolic dfx  Continue coreg 6.25 mg titrated as tolerated  ACE and aldactone on hold due to AKI   Pt underwent cardiac cath, on 5/8 showing severe calcific disease in the proximal to mid RCA, chronic total occlusion of the proximal circumflex, mod left main and mid LAD stenosis. Plan for stage PCI in the next few days .  Continue tele monitor  Daily weight, I&O's low salt diet  CXR show worsening air space disease, pneumonia vs pulm edema, he is afebrile and no leukocytosis. abg ordered.  One dose of lasix given and monitor urine output. Also obtained, blood cultures, lactic acid level and broad spectrum antibiotics with IV vancomycin and IV zosyn. In view of his renal  failure, caution using vancomycin.  PCCM consulted for  Further recommendations.   AKI - suspect cardiorenal syndrome vs from contrast vs diuretics.  Poor CO leading to poor kidney perfusion. - FeNA 0.64% suggestive of prerenal etiology . Worsening creatinine, suspect from pulmonary edema.   Renal US show complex left cystic mass, warrants additional surveillance with MRI or CT with contrast will defer after patient stable given patient will receive multiple contrast doses for cardiac cath. This can be follow as outpatient as well.    Enlarge prostate  Asymptomatic  PSA pending.  Consider urological evaluation if PSA elevated   Hypertension - stable  Per patient was taken off his BP meds by PCP PTA  Now on BB  Monitor BP   Squamous Cell Ca of the left piriform sinus and esophagus  In remission  Continue home pain med - Fentanyl patch.  Hyponatremia:  Probably fluid overloaded.  Lasix started by cardiology.  Monitor bmp daily.   DVT prophylaxis: Lovenox  Code Status: FULL  Family Communication: discussed with daughter at bedside.  Disposition Plan: transfer to step down.   Consultants:   Cardiology   PCCM.   Procedures:   ECHO 01/03/17 ------------------------------------------------------------------- Study Conclusions  - Left ventricle: The cavity size was normal. Wall thickness was   normal. The estimated ejection fraction was in the range of 10%   to 15%. Diffuse hypokinesis. DIastolic function is abnormal,   indeterminant grade. - Aortic valve: There was moderate regurgitation. Valve area (VTI):   1.08 cm^2. Valve area (Vmax): 1.5 cm^2. Valve area (Vmean): 1.5   cm^2. - Mitral valve: There was moderate regurgitation. - Left atrium: The  atrium was mildly dilated. - Right ventricle: Systolic function was mildly to moderately   reduced. - Tricuspid valve: There was moderate regurgitation. - Pulmonary arteries: Systolic pressure was moderately increased.   PA  peak pressure: 49 mm Hg (S).  Antimicrobials: Anti-infectives    Start     Dose/Rate Route Frequency Ordered Stop   01/08/17 1600  vancomycin (VANCOCIN) 500 mg in sodium chloride 0.9 % 100 mL IVPB     500 mg 100 mL/hr over 60 Minutes Intravenous Every 24 hours 01/07/17 1449     01/07/17 1530  vancomycin (VANCOCIN) IVPB 1000 mg/200 mL premix     1,000 mg 200 mL/hr over 60 Minutes Intravenous  Once 01/07/17 1449     01/07/17 1530  ceFEPIme (MAXIPIME) 1 g in dextrose 5 % 50 mL IVPB     1 g 100 mL/hr over 30 Minutes Intravenous Every 24 hours 01/07/17 1449         Objective: Vitals:   01/07/17 1334 01/07/17 1428 01/07/17 1446 01/07/17 1456  BP: (!) 142/75     Pulse: 91     Resp: 20 (!) 30 (!) 28   Temp: 99.2 F (37.3 C)     TempSrc: Axillary     SpO2: 100% 100% 98% 98%  Weight:      Height:        Intake/Output Summary (Last 24 hours) at 01/07/17 1458 Last data filed at 01/07/17 0710  Gross per 24 hour  Intake           906.66 ml  Output              500 ml  Net           406.66 ml   Filed Weights   01/05/17 0432 01/06/17 0513 01/07/17 0616  Weight: 54.3 kg (119 lb 12.8 oz) 53.4 kg (117 lb 11.2 oz) 54.7 kg (120 lb 11.2 oz)    Examination:  General exam: Frail, mod distress on venti mask.  Lethargic  Respiratory system: bialteral crackles and rhonchi.  Cardiovascular system: W1X9 RRR 2/6 systolic murmurs + S3 no JVD   Gastrointestinal system: Abd Soft NTND Extremities: No pedal edema  Skin: No lesions or rash   Data Reviewed: I have personally reviewed following labs and imaging studies  CBC:  Recent Labs Lab 01/02/17 0658 01/06/17 0423  WBC 3.1* 8.0  NEUTROABS 2.1  --   HGB 13.1 13.8  HCT 39.3 42.0  MCV 86.9 86.1  PLT 192 147   Basic Metabolic Panel:  Recent Labs Lab 01/03/17 0508 01/04/17 0511 01/05/17 0625 01/06/17 0423 01/07/17 0214  NA 136 133* 131* 132* 127*  K 4.4 4.5 4.6 5.1 5.1  CL 96* 92* 89* 90* 89*  CO2 29 30 31 29  19*  GLUCOSE  118* 109* 119* 119* 123*  BUN 33* 41* 46* 42* 52*  CREATININE 1.70* 1.68* 1.72* 1.72* 2.01*  CALCIUM 9.6 9.0 8.9 8.9 8.5*   GFR: Estimated Creatinine Clearance: 28 mL/min (A) (by C-G formula based on SCr of 2.01 mg/dL (H)). Liver Function Tests:  Recent Labs Lab 01/02/17 0658  AST 39  ALT 25  ALKPHOS 37*  BILITOT 0.4  PROT 7.1  ALBUMIN 3.9   No results found for this or any previous visit (from the past 240 hour(s)).    Radiology Studies: Dg Chest 2 View  Result Date: 01/07/2017 CLINICAL DATA:  Street shortness of breath, follow-up from chest x-ray of Jan 02, 2017 at which time pulmonary edema  or less likely pneumonia was suspected. EXAM: CHEST  2 VIEW COMPARISON:  PA and lateral chest x-ray of Jan 02, 2017 FINDINGS: The lungs are adequately inflated. There are increased confluent alveolar opacities in the right mid and lower lung and in the left mid and lower lung. The interstitial markings are mildly increased. The cardiac silhouette remains enlarged. There is no significant left pleural effusion. There is a small right pleural effusion. There is calcification in the wall of the aortic arch. The power port catheter tip projects over the junction of the middle and distal thirds of the SVC. IMPRESSION: Worsening of airspace opacities compatible with pneumonia or asymmetric pulmonary edema though I favor the former. Mild cardiomegaly without pulmonary vascular congestion. Thoracic aortic atherosclerosis. Electronically Signed   By: David  Martinique M.D.   On: 01/07/2017 14:06   Scheduled Meds: . atorvastatin  20 mg Oral Daily  . carvedilol  6.25 mg Oral BID WC  . fentaNYL  12.5 mcg Transdermal Q72H  . ipratropium-albuterol  3 mL Nebulization TID  . multivitamin with minerals  1 tablet Oral Daily  . sodium chloride flush  3 mL Intravenous Q12H  . sodium chloride flush  3 mL Intravenous Q12H   Continuous Infusions: . sodium chloride    . sodium chloride    . ceFEPime (MAXIPIME) IV      . [START ON 01/08/2017] vancomycin    . vancomycin       LOS: 5 days   Collin Bekker, MD Pager: Text Page via [email or web address]  [phone]  If 7PM-7AM, please contact night-coverage [email or web address] Password [email or web address] 01/07/2017, 2:58 PM

## 2017-01-07 NOTE — Progress Notes (Signed)
Pt is breathing mostly through his mouth. Venturi mask applied

## 2017-01-08 ENCOUNTER — Inpatient Hospital Stay (HOSPITAL_COMMUNITY): Payer: Medicare Other

## 2017-01-08 DIAGNOSIS — Z9981 Dependence on supplemental oxygen: Secondary | ICD-10-CM

## 2017-01-08 DIAGNOSIS — Z8501 Personal history of malignant neoplasm of esophagus: Secondary | ICD-10-CM

## 2017-01-08 DIAGNOSIS — I5022 Chronic systolic (congestive) heart failure: Secondary | ICD-10-CM

## 2017-01-08 DIAGNOSIS — B370 Candidal stomatitis: Secondary | ICD-10-CM

## 2017-01-08 DIAGNOSIS — Z85828 Personal history of other malignant neoplasm of skin: Secondary | ICD-10-CM

## 2017-01-08 DIAGNOSIS — N183 Chronic kidney disease, stage 3 (moderate): Secondary | ICD-10-CM

## 2017-01-08 DIAGNOSIS — Z978 Presence of other specified devices: Secondary | ICD-10-CM

## 2017-01-08 DIAGNOSIS — Z9221 Personal history of antineoplastic chemotherapy: Secondary | ICD-10-CM

## 2017-01-08 DIAGNOSIS — J9621 Acute and chronic respiratory failure with hypoxia: Secondary | ICD-10-CM

## 2017-01-08 DIAGNOSIS — Z923 Personal history of irradiation: Secondary | ICD-10-CM

## 2017-01-08 LAB — BLOOD CULTURE ID PANEL (REFLEXED)
ACINETOBACTER BAUMANNII: NOT DETECTED
CANDIDA ALBICANS: NOT DETECTED
CANDIDA PARAPSILOSIS: NOT DETECTED
Candida glabrata: NOT DETECTED
Candida krusei: NOT DETECTED
Candida tropicalis: NOT DETECTED
ENTEROBACTERIACEAE SPECIES: NOT DETECTED
ENTEROCOCCUS SPECIES: NOT DETECTED
Enterobacter cloacae complex: NOT DETECTED
Escherichia coli: NOT DETECTED
HAEMOPHILUS INFLUENZAE: NOT DETECTED
KLEBSIELLA OXYTOCA: NOT DETECTED
Klebsiella pneumoniae: NOT DETECTED
LISTERIA MONOCYTOGENES: NOT DETECTED
METHICILLIN RESISTANCE: NOT DETECTED
NEISSERIA MENINGITIDIS: NOT DETECTED
PSEUDOMONAS AERUGINOSA: NOT DETECTED
Proteus species: NOT DETECTED
SERRATIA MARCESCENS: NOT DETECTED
STAPHYLOCOCCUS AUREUS BCID: DETECTED — AB
STREPTOCOCCUS PNEUMONIAE: NOT DETECTED
STREPTOCOCCUS SPECIES: NOT DETECTED
Staphylococcus species: DETECTED — AB
Streptococcus agalactiae: NOT DETECTED
Streptococcus pyogenes: NOT DETECTED

## 2017-01-08 LAB — CBC
HEMATOCRIT: 34.8 % — AB (ref 39.0–52.0)
Hemoglobin: 11.5 g/dL — ABNORMAL LOW (ref 13.0–17.0)
MCH: 28 pg (ref 26.0–34.0)
MCHC: 33 g/dL (ref 30.0–36.0)
MCV: 84.7 fL (ref 78.0–100.0)
Platelets: 144 10*3/uL — ABNORMAL LOW (ref 150–400)
RBC: 4.11 MIL/uL — ABNORMAL LOW (ref 4.22–5.81)
RDW: 13.7 % (ref 11.5–15.5)
WBC: 8.1 10*3/uL (ref 4.0–10.5)

## 2017-01-08 LAB — BASIC METABOLIC PANEL
Anion gap: 12 (ref 5–15)
BUN: 66 mg/dL — ABNORMAL HIGH (ref 6–20)
CALCIUM: 8.3 mg/dL — AB (ref 8.9–10.3)
CO2: 27 mmol/L (ref 22–32)
CREATININE: 2.02 mg/dL — AB (ref 0.61–1.24)
Chloride: 86 mmol/L — ABNORMAL LOW (ref 101–111)
GFR calc Af Amer: 38 mL/min — ABNORMAL LOW (ref >60)
GFR, EST NON AFRICAN AMERICAN: 33 mL/min — AB (ref >60)
GLUCOSE: 91 mg/dL (ref 65–99)
Potassium: 4.4 mmol/L (ref 3.5–5.1)
Sodium: 125 mmol/L — ABNORMAL LOW (ref 135–145)

## 2017-01-08 LAB — COOXEMETRY PANEL
Carboxyhemoglobin: 0.9 % (ref 0.5–1.5)
Methemoglobin: 1.1 % (ref 0.0–1.5)
O2 Saturation: 52.4 %
Total hemoglobin: 11.6 g/dL — ABNORMAL LOW (ref 12.0–16.0)

## 2017-01-08 LAB — PHOSPHORUS: PHOSPHORUS: 3.5 mg/dL (ref 2.5–4.6)

## 2017-01-08 LAB — GLUCOSE, CAPILLARY: Glucose-Capillary: 132 mg/dL — ABNORMAL HIGH (ref 65–99)

## 2017-01-08 LAB — MAGNESIUM: MAGNESIUM: 2.7 mg/dL — AB (ref 1.7–2.4)

## 2017-01-08 MED ORDER — MILRINONE LACTATE IN DEXTROSE 20-5 MG/100ML-% IV SOLN
0.2500 ug/kg/min | INTRAVENOUS | Status: DC
  Administered 2017-01-08 – 2017-01-12 (×5): 0.25 ug/kg/min via INTRAVENOUS
  Filled 2017-01-08 (×6): qty 100

## 2017-01-08 MED ORDER — FLUCONAZOLE IN SODIUM CHLORIDE 100-0.9 MG/50ML-% IV SOLN: 100.0000 mg | INTRAVENOUS | Status: DC

## 2017-01-08 MED ORDER — JEVITY 1.2 CAL PO LIQD
1000.0000 mL | ORAL | Status: DC
  Administered 2017-01-08 – 2017-01-12 (×6): 1000 mL
  Filled 2017-01-08 (×13): qty 1000

## 2017-01-08 MED ORDER — FLUCONAZOLE IN SODIUM CHLORIDE 100-0.9 MG/50ML-% IV SOLN
100.0000 mg | INTRAVENOUS | Status: AC
  Administered 2017-01-08 – 2017-01-14 (×7): 100 mg via INTRAVENOUS
  Filled 2017-01-08 (×11): qty 50

## 2017-01-08 NOTE — Progress Notes (Signed)
PHARMACY - PHYSICIAN COMMUNICATION CRITICAL VALUE ALERT - BLOOD CULTURE IDENTIFICATION (BCID)  Results for orders placed or performed during the hospital encounter of 01/02/17  Blood Culture ID Panel (Reflexed) (Collected: 01/07/2017  3:47 PM)  Result Value Ref Range   Enterococcus species NOT DETECTED NOT DETECTED   Listeria monocytogenes NOT DETECTED NOT DETECTED   Staphylococcus species DETECTED (A) NOT DETECTED   Staphylococcus aureus DETECTED (A) NOT DETECTED   Methicillin resistance NOT DETECTED NOT DETECTED   Streptococcus species NOT DETECTED NOT DETECTED   Streptococcus agalactiae NOT DETECTED NOT DETECTED   Streptococcus pneumoniae NOT DETECTED NOT DETECTED   Streptococcus pyogenes NOT DETECTED NOT DETECTED   Acinetobacter baumannii NOT DETECTED NOT DETECTED   Enterobacteriaceae species NOT DETECTED NOT DETECTED   Enterobacter cloacae complex NOT DETECTED NOT DETECTED   Escherichia coli NOT DETECTED NOT DETECTED   Klebsiella oxytoca NOT DETECTED NOT DETECTED   Klebsiella pneumoniae NOT DETECTED NOT DETECTED   Proteus species NOT DETECTED NOT DETECTED   Serratia marcescens NOT DETECTED NOT DETECTED   Haemophilus influenzae NOT DETECTED NOT DETECTED   Neisseria meningitidis NOT DETECTED NOT DETECTED   Pseudomonas aeruginosa NOT DETECTED NOT DETECTED   Candida albicans NOT DETECTED NOT DETECTED   Candida glabrata NOT DETECTED NOT DETECTED   Candida krusei NOT DETECTED NOT DETECTED   Candida parapsilosis NOT DETECTED NOT DETECTED   Candida tropicalis NOT DETECTED NOT DETECTED    Name of physician (or Provider) Contacted: Dr. Bonner Puna  Changes to prescribed antibiotics required: None. Discussed with physician concerning D/Cing vanc and cefepime, and starting cefazolin. Provider would like to stay the course for now as concern for aspiration PNA.   Geraldo Pitter 01/08/2017  2:55 PM

## 2017-01-08 NOTE — Progress Notes (Signed)
Modified Barium Swallow Progress Note  Patient Details  Name: Collin Young MRN: [id number] Date of Birth: 02-05-1951  Today's Date: 01/08/2017  Modified Barium Swallow completed.  Full report located under Chart Review in the Imaging Section.  Brief recommendations include the following:  Clinical Impression  Pt demonstrates severe to profound oropharyngeal dysphagia secondary to fibrotic musculature following RXT in 2015 without compliance to recommendations for exercise program. Due to severe lack of mobility of hyolaryngeal complex and pharyngeal musculature there is minimal ability to protect airway during the swallow leading to immediate severe penetration to the cords before/during the swallow. Pt does appear to achieve moderate glottic closure to hold penetrates in the vestibule, but as soon as he releases the swallow, they spill into trachea. There is absent sensation from pt. In addition, due to lack of pharyngeal peristalsis and hyolaryngeal excursion, at least 50% of the bolus remains pooled in the vallecula and pyriforms post swallow, with increased severity with thick liquids. Attempted all positional compensations without success. Best strategy was a super supraglottic swallow to capitilaze on glottic closure and expectoration of penetrate. Even with this strategy, severe aspiration occurs before and after attempts. Pt verbalizes desire to continue all treatments to prolong life and would not want to accept risk of aspiration. Given dx of pna and need for further medical interventions, aggressive prevention of aspiration of PO is recommended. Suggest short term alternate means of nutrition until cardiac procedure is complete and pts respiratory status is somewhat improved.  Recommend ice chips after oral care only at this time.  Will plan to retest next week, but expect poor spontaneous recovery or potential to improve with therapy over the short term given length of time since RXT and  severity of impairment.    Swallow Evaluation Recommendations       SLP Diet Recommendations: NPO;Alternative means - temporary;Ice chips PRN after oral care       Medication Administration: Via alternative means               Oral Care Recommendations: Oral care before and after PO (ice chips)       .Herbie Baltimore, [place] CCC-SLP [phone]  [name], Katherene Ponto 01/08/2017,10:53 AM

## 2017-01-08 NOTE — Progress Notes (Signed)
Progress Note  Patient Name: Collin Young Date of Encounter: 01/08/2017  Primary Cardiologist: New to Dr. Oval Linsey  Subjective   He feels better this am, now on 4L O2 via [place].   Inpatient Medications    Scheduled Meds: . atorvastatin  20 mg Oral Daily  . budesonide (PULMICORT) nebulizer solution  0.5 mg Nebulization BID  . carvedilol  6.25 mg Oral BID WC  . ipratropium-albuterol  3 mL Nebulization QID  . multivitamin with minerals  1 tablet Oral Daily  . sodium chloride flush  3 mL Intravenous Q12H  . sodium chloride flush  3 mL Intravenous Q12H   Continuous Infusions: . sodium chloride    . sodium chloride    . ceFEPime (MAXIPIME) IV Stopped (01/07/17 1636)  . nitroGLYCERIN 5 mcg/min (01/07/17 1822)  . vancomycin     PRN Meds: sodium chloride, sodium chloride, acetaminophen, albuterol, guaiFENesin-dextromethorphan, lip balm, MUSCLE RUB, ondansetron (ZOFRAN) IV, phenol, polyethylene glycol, polyvinyl alcohol, sodium chloride, sodium chloride flush, sodium chloride flush, sodium chloride flush   Vital Signs    Vitals:   01/07/17 1926 01/08/17 0100 01/08/17 0500 01/08/17 0810  BP: 129/78 109/60 124/66   Pulse: 94 85 81   Resp: (!) 28 (!) 21 20   Temp: 97.8 F (36.6 C)     TempSrc: Oral  Oral   SpO2: 96%  98% 96%  Weight:   121 lb 9.6 oz (55.2 kg)   Height:        Intake/Output Summary (Last 24 hours) at 01/08/17 0858 Last data filed at 01/08/17 0616  Gross per 24 hour  Intake           307.85 ml  Output             1425 ml  Net         -1117.15 ml   Filed Weights   01/06/17 0513 01/07/17 0616 01/08/17 0500  Weight: 117 lb 11.2 oz (53.4 kg) 120 lb 11.2 oz (54.7 kg) 121 lb 9.6 oz (55.2 kg)    Telemetry    NSR- Personally Reviewed  ECG    None today   Physical Exam   GEN: mild  acute distress.   Neck: JVD + 6 B/L Cardiac: RRR, no murmurs, rubs, or gallops.  Respiratory: faint rales GI: Soft, nontender, non-distended  MS: No edema; No  deformity. Neuro:  Nonfocal  Psych: Normal affect   Labs    Chemistry Recent Labs Lab 01/02/17 0658  01/06/17 0423 01/07/17 0214 01/08/17 0410  NA 135  < > 132* 127* 125*  K 4.2  < > 5.1 5.1 4.4  CL 101  < > 90* 89* 86*  CO2 20*  < > 29 19* 27  GLUCOSE 168*  < > 119* 123* 91  BUN 29*  < > 42* 52* 66*  CREATININE 1.54*  < > 1.72* 2.01* 2.02*  CALCIUM 9.1  < > 8.9 8.5* 8.3*  PROT 7.1  --   --   --   --   ALBUMIN 3.9  --   --   --   --   AST 39  --   --   --   --   ALT 25  --   --   --   --   ALKPHOS 37*  --   --   --   --   BILITOT 0.4  --   --   --   --   GFRNONAA 45*  < >  40* 33* 33*  GFRAA 53*  < > 46* 38* 38*  ANIONGAP 14  < > 13 19* 12  < > = values in this interval not displayed.   Hematology  Recent Labs Lab 01/02/17 0658 01/06/17 0423 01/08/17 0410  WBC 3.1* 8.0 8.1  RBC 4.52 4.88 4.11*  HGB 13.1 13.8 11.5*  HCT 39.3 42.0 34.8*  MCV 86.9 86.1 84.7  MCH 29.0 28.3 28.0  MCHC 33.3 32.9 33.0  RDW 14.3 13.6 13.7  PLT 192 189 144*    Cardiac EnzymesNo results for input(s): TROPONINI in the last 168 hours.   Recent Labs Lab 01/02/17 0713  TROPIPOC 0.04     BNP  Recent Labs Lab 01/02/17 0658 01/05/17 0625  BNP 1,999.4* 2,182.6*     DDimer No results for input(s): DDIMER in the last 168 hours.   Radiology    Dg Chest 2 View  Result Date: 01/07/2017 CLINICAL DATA:  Street shortness of breath, follow-up from chest x-ray of Jan 02, 2017 at which time pulmonary edema or less likely pneumonia was suspected. EXAM: CHEST  2 VIEW COMPARISON:  PA and lateral chest x-ray of Jan 02, 2017 FINDINGS: The lungs are adequately inflated. There are increased confluent alveolar opacities in the right mid and lower lung and in the left mid and lower lung. The interstitial markings are mildly increased. The cardiac silhouette remains enlarged. There is no significant left pleural effusion. There is a small right pleural effusion. There is calcification in the wall of  the aortic arch. The power port catheter tip projects over the junction of the middle and distal thirds of the SVC. IMPRESSION: Worsening of airspace opacities compatible with pneumonia or asymmetric pulmonary edema though I favor the former. Mild cardiomegaly without pulmonary vascular congestion. Thoracic aortic atherosclerosis. Electronically Signed   By: David  Martinique M.D.   On: 01/07/2017 14:06   Dg Chest Port 1 View  Result Date: 01/08/2017 CLINICAL DATA:  Respiratory failure.  Esophageal cancer. EXAM: PORTABLE CHEST 1 VIEW COMPARISON:  Yesterday FINDINGS: Bilateral central and basilar airspace disease is not significantly changed allowing for differences in patient position. Heart is upper normal in size. Right jugular Port-A-Cath is stable. No pneumothorax. IMPRESSION: Stable bilateral airspace disease in a pulmonary edema or ARDS pattern. Electronically Signed   By: Marybelle Killings M.D.   On: 01/08/2017 07:45    Cardiac Studies   Echo 01/03/17- Study Conclusions  - Left ventricle: The cavity size was normal. Wall thickness was normal. The estimated ejection fraction was in the range of 10% to 15%. Diffuse hypokinesis. DIastolic function is abnormal, indeterminant grade. - Aortic valve: There was moderate regurgitation. Valve area (VTI): 1.08 cm^2. Valve area (Vmax): 1.5 cm^2. Valve area (Vmean): 1.5 cm^2. - Mitral valve: There was moderate regurgitation. - Left atrium: The atrium was mildly dilated. - Right ventricle: Systolic function was mildly to moderately reduced. - Tricuspid valve: There was moderate regurgitation. - Pulmonary arteries: Systolic pressure was moderately increased. PA peak pressure: 49 mm Hg (S).  Cath 01/05/17 Right/Left Heart Cath and Coronary Angiography  Conclusion     Prox RCA to Mid RCA lesion, 99 %stenosed.  Mid RCA lesion, 30 %stenosed.  Dist RCA lesion, 40 %stenosed.  RPDA lesion, 20 %stenosed.  Prox Cx lesion, 100  %stenosed.  Ost Ramus to Ramus lesion, 50 %stenosed.  Ost 3rd Diag to 3rd Diag lesion, 30 %stenosed.  Mid LAD lesion, 40 %stenosed.  Ost LAD to Prox LAD lesion, 20 %stenosed.  Colon Flattery  LM to LM lesion, 40 %stenosed.  Hemodynamic findings consistent with mild pulmonary hypertension.   1. Severe calcific disease in the proximal to mid RCA 2. 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 Moderate left main and mid LAD stenosis 4. Elevated filling pressures  Recommendations: He has multivessel CAD. I think the RCA can be approached with PCI but will likely require atherectomy prior to stenting. I would treat the remainder of his CAD medically. Will stage PCI given renal insufficiency. Hydrate gently tonight. May need diuresis tomorrow given elevated filling pressures. Would anticipate PCI on Thursday or Friday of this week if renal function remains stable.     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 He is s/p chemotherapy and radiation 2015 (remission). He was admitted 01/02/17 with shortness of breath and found to have a BNP of 2k and an EF of 10-15% by echo. Previous EF was normal by Myoview in 2011.  Assessment & Plan    1. ICM 2. Acute systolic CHF 3. Acute renal insufficiency 4. H/O sinus and esophageal CA-  5. 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 He is s/p chemotherapy and radiation 2015 (remission). He was admitted 01/02/17 with shortness of breath and found to have a BNP of 2k and an EF of 10-15% by echo. Cardiac cath showed severe calcific disease in the proximal to mid RCA, chronic total occlusion of the proximal Circumflex, moderate left main and mid LAD stenosis. Plan was for staged PCI once improved renal function. However renal function  worsen to 2.01.  The patient deteriorated yesterday, with acute onset SOB, appears fluid overloaded on physical exam, CXR showed pneumonia vs worsening CHF, the patient is saturating 97% on non-re breather. PCCM saw him and started on breathing treatments, antibiotics. He diuresed -1.1 L overnight with improvement of symptoms. I will continue lasix 20 mg iv BID, start NTG infusion, CoOX 48%, I will start Milrinone 0.25 mg.   Ena Dawley, MD 01/08/2017

## 2017-01-08 NOTE — Progress Notes (Addendum)
Name: Collin Young MRN: [id number] DOB: 12/16/1950    ADMISSION DATE:  01/02/2017 CONSULTATION DATE:  01/07/2017  REFERRING MD :  Dr. 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   Presents to ED on 5/5 with progressive dyspnea for the last several weeks. BNP 2000, Creatinine 1.5, WBC 3.1. CXR significant for pulmonary edema. ECHO (Previous with normal EF) EF 10-15%  with diffuse hypokinesis and moderate aortic regurgitation. Treated with IV lasix. Cardiology was consulted and patient underwent right heart cath on 5/18 in which revealed multivessel CAD with plans to do PCI of RCA next week if renal function is stable.   On 5/10 patient became tachypneic and hypoxic, given 40 mg IV lasix with 360ml output, with improvement. Patient transferred to Step-down for further monitoring. PCCM asked to consult.   SIGNIFICANT EVENTS  5/5 > Admission  5/8 > Heart Cath  5/10 > Transfer to Step-Down   STUDIES:  CXR 5/5 > Interstitial and alveolar edema, new small pleural effusions US renal 5/7 > Complex left cystic mass, mildly prominent and mildly inhomogeneous prostate  CXR 5/10 > Worsening airspace opacities compatible with PNA or asymmetric pulmonary edema, mild cardiomegaly without vascular congestion  CXR 5/11> Stable bilateral airspace disease in a pulmonary edema or ARDS pattern  SUBJECTIVE:  Supine in bed, just returned from [place], on 4L [place] with saturations of 100%    VITAL SIGNS: Temp:  [97.8 F (36.6 C)-99.2 F (37.3 C)] 98 F (36.7 C) (05/11 0900) Pulse Rate:  [80-95] 80 (05/11 0900) Resp:  [18-44] 22 (05/11 0900) BP: (109-148)/(33-80) 118/68 (05/11 0900) SpO2:  [94 %-100 %] 95 % (05/11 0900) FiO2 (%):  [31 %] 31  % (05/10 1926) Weight:  [121 lb 9.6 oz (55.2 kg)] 121 lb 9.6 oz (55.2 kg) (05/11 0500)  PHYSICAL EXAMINATION: General:  Thin adult male,supine in bed, wearing nasal oxygen, NAD Neuro: Alert and appropriate, following commands, MAE x 4, Oriented x 3 HEENT:  Atraumatic, Normocephalic Cardiovascular:  RRR, no MRG, NI S1/S2  Lungs:  Diminished breath sounds, no wheeze, crackles per bases Abdomen:  Soft, flat, non-tender, Non-distended, BS + Musculoskeletal:  No obvious deformities, without edema  Skin:  Warm, dry, intact    Recent Labs Lab 01/06/17 0423 01/07/17 0214 01/08/17 0410  NA 132* 127* 125*  K 5.1 5.1 4.4  CL 90* 89* 86*  CO2 29 19* 27  BUN 42* 52* 66*  CREATININE 1.72* 2.01* 2.02*  GLUCOSE 119* 123* 91    Recent Labs Lab 01/02/17 0658 01/06/17 0423 01/08/17 0410  HGB 13.1 13.8 11.5*  HCT 39.3 42.0 34.8*  WBC 3.1* 8.0 8.1  PLT 192 189 144*   Dg Chest 2 View  Result Date: 01/07/2017 CLINICAL DATA:  Street shortness of breath, follow-up from chest x-ray of Jan 02, 2017 at which time pulmonary edema or less likely pneumonia was suspected. EXAM: CHEST  2 VIEW COMPARISON:  PA and lateral chest x-ray of Jan 02, 2017 FINDINGS: The lungs are adequately inflated. There are increased confluent alveolar opacities in the right mid and lower lung and in the left mid and lower lung. The interstitial markings are mildly increased. The cardiac silhouette remains enlarged. There is no significant left  pleural effusion. There is a small right pleural effusion. There is calcification in the wall of the aortic arch. The power port catheter tip projects over the junction of the middle and distal thirds of the SVC. IMPRESSION: Worsening of airspace opacities compatible with pneumonia or asymmetric pulmonary edema though I favor the former. Mild cardiomegaly without pulmonary vascular congestion. Thoracic aortic atherosclerosis. Electronically Signed   By: David  Martinique M.D.   On: 01/07/2017  14:06   Dg Chest Port 1 View  Result Date: 01/08/2017 CLINICAL DATA:  Respiratory failure.  Esophageal cancer. EXAM: PORTABLE CHEST 1 VIEW COMPARISON:  Yesterday FINDINGS: Bilateral central and basilar airspace disease is not significantly changed allowing for differences in patient position. Heart is upper normal in size. Right jugular Port-A-Cath is stable. No pneumothorax. IMPRESSION: Stable bilateral airspace disease in a pulmonary edema or ARDS pattern. Electronically Signed   By: Marybelle Killings M.D.   On: 01/08/2017 07:45    ASSESSMENT / PLAN:  Acute Hypoxic Respiratory Failure in setting volume overload vs PNA Plan - Maintain Oxygenation >92 - Continued Aggressive Pulmonary Hygiene  - BIPAP PRN ( Required 5/10 pm) - Scheduled Duoneb  - Incentive Spirometry - OOB and mobilize as able  Acute on Chronic systolic heart failure  Plan - Telemetry  monitoring  - Continue Diuresis as kidneys tolerate per Cardiology, as CXR consistent with edema - Maintain MAP > 65 mm Hg  ?PNA Swallow eval with suspected aspiration thin liquids Plan -Trend WBC and Fever curve  -Trend Procal  -Continue Cefepime and Vancomycin  -Send Sputum Culture  - CXR prn - Swallow Eval 5/11 - NPO until further objective testing for best diet/ precautions to prevent aspiration  Acute on Chronic Renal Disease (Bas Crt 1.2-1.3) Hyponatremia ( ? Fluid overload) Plan  - Continue to Trend BMP - Replace electrolytes as needed - Avoid Nephrotoxic Medications - Maintain renal perfusion - Strict I&O  - Consider serum and urine sodium studies  Remaining care per primary team  Magdalen Spatz,, AGACNP-BC [facility]  Pgr: [phone]  PCCM Pgr: [phone]  Attending Note:  66 year old male with pulmonary edema and respiratory failure requiring BiPAP and PCCM was consulted.  Patient was diuresed and maintained on BiPAP.  Now off.  On exam, lungs with bibasilar crackles.  I reviewed CXR  myself, pulmonary edema noted.  Discussed with PCCM-NP.  Acute respiratory failure:  - Titrate O2 for sat of 88-92%.  - Duonebs  - D/C BiPAP  Pulmonary edema:  - Diureses as renal function allows.  - F/U imaging as needed.  CHF:  - Diureses as ordered  - Tele monitoring  - Maintain MAP>=65  Renal failure:  - Avoid nephrotoxic medications  - BMET in AM  - Replace electrolytes as indicated.  PCCM will sign off, please call back if needed.  Patient seen and examined, agree with above note.  I dictated the care and orders written for this patient under my direction.  Rush Farmer, [place]

## 2017-01-08 NOTE — Progress Notes (Signed)
RT NOTE:  BIPAP not indicated @ this time. Pt tolerating 4L [place] well. No SOB, WOB normal. BIPAP available @ bedside if needed.

## 2017-01-08 NOTE — Progress Notes (Signed)
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 He presented to the emergency department complaining of progressive shortness of breath for the past several weeks. He was admitted with concerns of CHF, with BNP significantly elevated at 2000. Echocardiogram revealed ejection fraction of 10-15% patient was started on beta blockers Lasix and Aldactone. Patient also on admission was found to be on acute kidney injury. Cardiology consulted, underwent cardiac cath showing significant multivessel disease. Recommended staged PCI when creatinine improves.   Subjective: Improving dyspnea from yesterday. Slept ok. Eating.   Assessment & Plan:  Acute respiratory failure with hypoxia: Due to ischemic cardiomyopathy/acute systolic CHF, pulmonary edema, and likely aspiration pneumonitis/pneumonia. CXR show worsening air space disease, pneumonia vs pulm edema, he is afebrile and no leukocytosis. abg ordered.  - Continue oxygen via nasal cannula as needed. Continue SDU level of care for now. - Continue nebs per pulmonology - Continue antibiotics as below - Repeat CXR in AM  Acute systolic heart failure: New diagnosis due to multivessel CAD.  - Continuing lasix 20mg  IV BID, started NTG gtt for pulmonary edema - Continue coreg 6.25 mg titrated as tolerated  - ACE and aldactone on hold due to AKI   - Staged PCI planned per cardiology - Continue tele monitor  - Daily weight, I&O's low salt diet  - Co-ox 48%, milrinone started per cardiology.   AKI - suspect cardiorenal syndrome vs from contrast vs diuretics. Poor CO leading to poor kidney perfusion. FENa 0.64% suggestive of prerenal etiology. -  Monitor with diuresis.   - Renal US show complex left cystic mass, warrants additional surveillance with MRI or CT with contrast will defer after patient stable given patient will receive multiple contrast doses for cardiac cath. This can be follow as outpatient as well.   MSSA bacteremia: in 1 of 2 blood cultures from 5/10.  - ID consulted, considering deescalation to ancef if improvement continues, pending other blood culture - No recommendations for TEE at this time due to poor health status.  - Repeat blood cultures 5/12  Enlarged prostate: Seen on U/S. No LUTS/BOO.  - Checking PSA. Consider urological evaluation if PSA elevated   Hypertension: Per patient was taken off his BP meds by PCP PTA  - Continue coreg and NTG gtt as tolerated   Severe aspiration: Related to h/o radiation to esophagus, MBSS 5/11 demonstrated frank aspiration of all consistencies.  - NPO - Cortrak to be placed, nutrition consulted.  - Reevaluate by SLP next week and consider PEG (has had this placed in the past) if no improvement.  - Covering for respiratory anaerobes as above.   Squamous cell CA of the left piriform sinus and esophagus: In remission  - Continuing home fentanyl patch.   Hyponatremia: Suspect hypervolemia - Monitor  DVT prophylaxis: Lovenox  Code Status: Full Family Communication: None at bedside this AM Disposition Plan: Transfer to telemetry if stable x 24 hours. Eventual DC to SNF suspected.   Consultants:   Cardiology   PCCM  ID   Procedures:   ECHO 01/03/17 - Left ventricle: The cavity size was normal. Wall thickness was   normal. The estimated ejection fraction was in the range of 10%   to 15%. Diffuse hypokinesis.  DIastolic function is abnormal,   indeterminant grade. - Aortic valve: There was moderate regurgitation. Valve area (VTI):   1.08 cm^2. Valve area (Vmax): 1.5 cm^2. Valve area (Vmean): 1.5   cm^2. - Mitral valve: There was moderate regurgitation. - Left  atrium: The atrium was mildly dilated. - Right ventricle: Systolic function was mildly to moderately   reduced. - Tricuspid valve: There was moderate regurgitation. - Pulmonary arteries: Systolic pressure was moderately increased.   PA peak pressure: 49 mm Hg (S).  Right/Left Heart Cath and Coronary Angiography 01/05/2017     Prox RCA to Mid RCA lesion, 99 %stenosed.  Mid RCA lesion, 30 %stenosed.  Dist RCA lesion, 40 %stenosed.  RPDA lesion, 20 %stenosed.  Prox Cx lesion, 100 %stenosed.  Ost Ramus to Ramus lesion, 50 %stenosed.  Ost 3rd Diag to 3rd Diag lesion, 30 %stenosed.  Mid LAD lesion, 40 %stenosed.  Ost LAD to Prox LAD lesion, 20 %stenosed.  Ost LM to LM lesion, 40 %stenosed.  Hemodynamic findings consistent with mild pulmonary hypertension.  1. Severe calcific disease in the proximal to mid RCA 2. 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 Moderate left main and mid LAD stenosis 4. Elevated filling pressures  Recommendations: He has multivessel CAD. I think the RCA can be approached with PCI but will likely require atherectomy prior to stenting. I would treat the remainder of his CAD medically. Will stage PCI given renal insufficiency. Hydrate gently tonight. May need diuresis tomorrow given elevated filling pressures. Would anticipate PCI on Thursday or Friday of this week if renal function remains stable.    Antimicrobials: Vanc/cefepime  Objective: Vitals:   01/08/17 1257 01/08/17 1657 01/08/17 1721 01/08/17 1952  BP: 131/75  115/65   Pulse: 81  80 84  Resp: 20  (!) 22 18  Temp: 98.1 F (36.7 C)     TempSrc: Oral     SpO2: 99% 98% 99% 100%  Weight:      Height:        Intake/Output Summary (Last 24 hours) at 01/08/17 1952 Last data filed at 01/08/17 1742  Gross per 24 hour  Intake           346.94 ml  Output             1050 ml  Net          -703.06 ml   Filed Weights   01/06/17 0513 01/07/17 0616 01/08/17 0500  Weight: 53.4 kg  (117 lb 11.2 oz) 54.7 kg (120 lb 11.2 oz) 55.2 kg (121 lb 9.6 oz)    Examination: General exam: Frail, chronically ill-appearing male in no distress. Hoarse voice. Respiratory system: Nonlabored, coarse throughout with bilateral crackles Cardiovascular system: Regular, no murmur, +S3 no JVD   Gastrointestinal system: Abd Soft NTND, +BS Extremities: No pedal edema  Skin: No lesions or rash. Right upper chest port site c/d/i  Data Reviewed: I have personally reviewed following labs and imaging studies  CBC:  Recent Labs Lab 01/02/17 0658 01/06/17 0423 01/08/17 0410  WBC 3.1* 8.0 8.1  NEUTROABS 2.1  --   --   HGB 13.1 13.8 11.5*  HCT 39.3 42.0 34.8*  MCV 86.9 86.1 84.7  PLT 192 189 573*   Basic Metabolic Panel:  Recent Labs Lab 01/04/17 0511 01/05/17 0625 01/06/17 0423 01/07/17 0214 01/07/17 2035 01/08/17 0410  NA 133* 131* 132* 127*  --  125*  K 4.5 4.6 5.1 5.1  --  4.4  CL 92* 89* 90*  89*  --  86*  CO2 30 31 29  19*  --  27  GLUCOSE 109* 119* 119* 123*  --  91  BUN 41* 46* 42* 52*  --  66*  CREATININE 1.68* 1.72* 1.72* 2.01*  --  2.02*  CALCIUM 9.0 8.9 8.9 8.5*  --  8.3*  MG  --   --   --   --  2.5* 2.7*  PHOS  --   --   --   --  2.8 3.5   GFR: Estimated Creatinine Clearance: 28.1 mL/min (A) (by C-G formula based on SCr of 2.02 mg/dL (H)). Liver Function Tests:  Recent Labs Lab 01/02/17 0658  AST 39  ALT 25  ALKPHOS 37*  BILITOT 0.4  PROT 7.1  ALBUMIN 3.9   Recent Results (from the past 240 hour(s))  Culture, blood (Routine X 2) w Reflex to ID Panel     Status: None (Preliminary result)   Collection Time: 01/07/17  3:43 PM  Result Value Ref Range Status   Specimen Description BLOOD RIGHT ANTECUBITAL  Final   Special Requests IN PEDIATRIC BOTTLE Blood Culture adequate volume  Final   Culture NO GROWTH < 24 HOURS  Final   Report Status PENDING  Incomplete  Culture, blood (Routine X 2) w Reflex to ID Panel     Status: None (Preliminary result)    Collection Time: 01/07/17  3:47 PM  Result Value Ref Range Status   Specimen Description BLOOD RIGHT HAND  Final   Special Requests IN PEDIATRIC BOTTLE Blood Culture adequate volume  Final   Culture  Setup Time   Final    GRAM POSITIVE COCCI IN CLUSTERS IN PEDIATRIC BOTTLE Organism ID to follow CRITICAL RESULT CALLED TO, READ BACK BY AND VERIFIED WITH: MVonita Moss.D. 13:30 01/08/17 ([email or web address])    Culture NO GROWTH < 24 HOURS  Final   Report Status PENDING  Incomplete  Blood Culture ID Panel (Reflexed)     Status: Abnormal   Collection Time: 01/07/17  3:47 PM  Result Value Ref Range Status   Enterococcus species NOT DETECTED NOT DETECTED Final   Listeria monocytogenes NOT DETECTED NOT DETECTED Final   Staphylococcus species DETECTED (A) NOT DETECTED Final    Comment: CRITICAL RESULT CALLED TO, READ BACK BY AND VERIFIED WITH: M. Vonita Moss.D. 13:30 01/08/17 ([email or web address])    Staphylococcus aureus DETECTED (A) NOT DETECTED Final    Comment: Methicillin (oxacillin) susceptible Staphylococcus aureus (MSSA). Preferred therapy is anti staphylococcal beta lactam antibiotic (Cefazolin or Nafcillin), unless clinically contraindicated. CRITICAL RESULT CALLED TO, READ BACK BY AND VERIFIED WITH: M. Radford Pax Pharm.D. 13:30 01/08/17 ([email or web address])    Methicillin resistance NOT DETECTED NOT DETECTED Final   Streptococcus species NOT DETECTED NOT DETECTED Final   Streptococcus agalactiae NOT DETECTED NOT DETECTED Final   Streptococcus pneumoniae NOT DETECTED NOT DETECTED Final   Streptococcus pyogenes NOT DETECTED NOT DETECTED Final   Acinetobacter baumannii NOT DETECTED NOT DETECTED Final   Enterobacteriaceae species NOT DETECTED NOT DETECTED Final   Enterobacter cloacae complex NOT DETECTED NOT DETECTED Final   Escherichia coli NOT DETECTED NOT DETECTED Final   Klebsiella oxytoca NOT DETECTED NOT DETECTED Final   Klebsiella pneumoniae NOT DETECTED NOT DETECTED Final   Proteus species NOT DETECTED NOT  DETECTED Final   Serratia marcescens NOT DETECTED NOT DETECTED Final   Haemophilus influenzae NOT DETECTED NOT DETECTED Final   Neisseria meningitidis NOT DETECTED NOT DETECTED Final   Pseudomonas aeruginosa NOT DETECTED NOT DETECTED Final  Candida albicans NOT DETECTED NOT DETECTED Final   Candida glabrata NOT DETECTED NOT DETECTED Final   Candida krusei NOT DETECTED NOT DETECTED Final   Candida parapsilosis NOT DETECTED NOT DETECTED Final   Candida tropicalis NOT DETECTED NOT DETECTED Final  MRSA PCR Screening     Status: None   Collection Time: 01/07/17  5:21 PM  Result Value Ref Range Status   MRSA by PCR NEGATIVE NEGATIVE Final    Comment:        The GeneXpert MRSA Assay (FDA approved for NASAL specimens only), is one component of a comprehensive MRSA colonization surveillance program. It is not intended to diagnose MRSA infection nor to guide or monitor treatment for MRSA infections.       Radiology Studies: Dg Chest 2 View  Result Date: 01/07/2017 CLINICAL DATA:  Street shortness of breath, follow-up from chest x-ray of Jan 02, 2017 at which time pulmonary edema or less likely pneumonia was suspected. EXAM: CHEST  2 VIEW COMPARISON:  PA and lateral chest x-ray of Jan 02, 2017 FINDINGS: The lungs are adequately inflated. There are increased confluent alveolar opacities in the right mid and lower lung and in the left mid and lower lung. The interstitial markings are mildly increased. The cardiac silhouette remains enlarged. There is no significant left pleural effusion. There is a small right pleural effusion. There is calcification in the wall of the aortic arch. The power port catheter tip projects over the junction of the middle and distal thirds of the SVC. IMPRESSION: Worsening of airspace opacities compatible with pneumonia or asymmetric pulmonary edema though I favor the former. Mild cardiomegaly without pulmonary vascular congestion. Thoracic aortic atherosclerosis.  Electronically Signed   By: David  Martinique M.D.   On: 01/07/2017 14:06   Dg Abd 1 View  Result Date: 01/08/2017 CLINICAL DATA:  Feeding tube placement. EXAM: ABDOMEN - 1 VIEW COMPARISON:  12/03/2013 FINDINGS: Enteric feeding tube tip projects in the stomach. There is residual contrast within the stomach, also within the small bowel and colon. Interstitial hazy airspace opacities are noted in the visualized lower lungs, with evidence of small pleural effusions. IMPRESSION: 1. Enteric feeding tube tip projects in the mid stomach. Electronically Signed   By: Lajean Manes M.D.   On: 01/08/2017 15:47   Dg Chest Port 1 View  Result Date: 01/08/2017 CLINICAL DATA:  Respiratory failure.  Esophageal cancer. EXAM: PORTABLE CHEST 1 VIEW COMPARISON:  Yesterday FINDINGS: Bilateral central and basilar airspace disease is not significantly changed allowing for differences in patient position. Heart is upper normal in size. Right jugular Port-A-Cath is stable. No pneumothorax. IMPRESSION: Stable bilateral airspace disease in a pulmonary edema or ARDS pattern. Electronically Signed   By: Marybelle Killings M.D.   On: 01/08/2017 07:45   Dg Swallowing Func-speech Pathology  Result Date: 01/08/2017 Objective Swallowing Evaluation: Type of Study: MBS-Modified Barium Swallow Study Patient Details Name: Collin Young MRN: [id number] Date of Birth: 01-21-1951 Today's Date: 01/08/2017 Time: SLP Start Time (ACUTE ONLY): 1000-SLP Stop Time (ACUTE ONLY): 1030 SLP Time Calculation (min) (ACUTE ONLY): 30 min Past Medical History: Past Medical History: Diagnosis Date . Cancer [facility])   Esophagus- radiation 2011 . GERD (gastroesophageal reflux disease)   takes zantac prn . Headache  . History of radiation therapy 10/19/2013-12/05/2013  pyriform sinus 69.96 Gy/29fx . HOH (hard of hearing)  . Hyperlipemia  . Hypertension  . Seizures ([place])   alcohol withdrawls- 2001 Past Surgical History: Past Surgical History: Procedure Laterality Date .  CARDIOVASCULAR STRESS TEST  10/09/09  normal nuclearr stress test, EF 57% [name]) . LARYNGOSCOPY N/A 09/15/2013  Procedure: LARYNGOSCOPY;  Surgeon: [name], MD;  Location: [place];  Service: ENT;  Laterality: N/A;  direct laryngoscopy with biopsy and esophagoscopy . NO PAST SURGERIES   . RIGHT/LEFT HEART CATH AND CORONARY ANGIOGRAPHY N/A 01/05/2017  Procedure: Right/Left Heart Cath and Coronary Angiography;  Surgeon: [name], MD;  Location: [place] CV LAB;  Service: Cardiovascular;  Laterality: N/A; HPI: 66 year old male with PMH of GERD, squamous cell carcinoma of the left pyriform and squamous cell carcinoma of the esophagus s/p chemotherapy and radiation (2015) currently in remission, systolic CHF, and stage 3 CKD Presents to ED on 5/5 with progressive dyspnea for the last several weeks. CXR significant for pulmonary edema. Treated with IV lasix. Cardiology was consulted and patient underwent right heart cath on 5/18 in which revealed multivessel CAD with plans to do PCI of RCA next week if renal function is stable. On 5/10 patient became tachypneic and hypoxic, given 40 mg IV lasix with 378ml output, with improvement. CXR shows worsening airspace opacities compatible with PNA or asymmetric pulmonary edema, mild cardiomegaly without vascular congestion. Pt was seen in OP SLP tx over 11 months, 4 visits. Pt reported that swallowing issues had resolved, but was  noncompliant with exercise program. Pt had G tube during RXT, it was removed in 2015.  In early 2016 SLP reported consistent throat clearing/cough observed with thin liquids, small sip chin tuck and effortful swallow x2 eliminated cough. No MBS or FEES on record.  No Data Recorded Assessment / Plan / Recommendation CHL IP CLINICAL IMPRESSIONS 01/08/2017 Clinical Impression Pt demonstrates severe to profound oropharyngeal dysphagia secondary to fibrotic musculature following RXT in 2015 without compliance to recommendations for exercise  program. Due to severe lack of mobility of hyolaryngeal complex and pharyngeal musculature there is minimal ability to protect airway during the swallow leading to immediate severe penetration to the cords before/during the swallow. Pt does appear to achieve moderate glottic closure to hold penetrates in the vestibule, but as soon as he releases the swallow, they spill into trachea. There is absent sensation from pt. In addition, due to lack of pharyngeal peristalsis and hyolaryngeal excursion, at least 50% of the bolus remains pooled in the vallecula and pyriforms post swallow, with increased severity with thick liquids. Attempted all positional compensations without success. Best strategy was a super supraglottic swallow to capitilaze on glottic closure and expectoration of penetrate. Even with this strategy, severe aspiration occurs before and after attempts. Pt verbalizes desire to continue all treatments to prolong life and would not want to accept risk of aspiration. Given dx of pna and need for further medical interventions, aggressive prevention of aspiration of PO is recommended. Suggest short term alternate means of nutrition until cardiac procedure is complete and pts respiratory status is somewhat improved.  Recommend ice chips after oral care only at this time.  Will plan to retest next week, but expect poor spontaneous recovery or potential to improve with therapy over the short term given length of time since RXT and severity of impairment.  SLP Visit Diagnosis Dysphagia, oropharyngeal phase (R13.12) Attention and concentration deficit following -- Frontal lobe and executive function deficit following -- Impact on safety and function Severe aspiration risk;Risk for inadequate nutrition/hydration   CHL IP TREATMENT RECOMMENDATION 01/08/2017 Treatment Recommendations Therapy as outlined in treatment plan below   Prognosis 01/08/2017 Prognosis for Safe Diet Advancement Guarded  Barriers to Reach Goals  Severity of deficits;Time post onset Barriers/Prognosis Comment -- CHL IP DIET RECOMMENDATION 01/08/2017 SLP Diet Recommendations NPO;Alternative means - temporary;Ice chips PRN after oral care Liquid Administration via -- Medication Administration Via alternative means Compensations -- Postural Changes --   CHL IP OTHER RECOMMENDATIONS 01/08/2017 Recommended Consults -- Oral Care Recommendations Oral care before and after PO Other Recommendations --   CHL IP FOLLOW UP RECOMMENDATIONS 01/08/2017 Follow up Recommendations Skilled Nursing facility   [facility] IP FREQUENCY AND DURATION 01/08/2017 Speech Therapy Frequency (ACUTE ONLY) min 2x/week Treatment Duration 2 weeks      CHL IP ORAL PHASE 01/08/2017 Oral Phase Impaired Oral - Pudding Teaspoon -- Oral - Pudding Cup -- Oral - Honey Teaspoon -- Oral - Honey Cup -- Oral - Nectar Teaspoon -- Oral - Nectar Cup -- Oral - Nectar Straw -- Oral - Thin Teaspoon -- Oral - Thin Cup -- Oral - Thin Straw -- Oral - Puree -- Oral - Mech Soft -- Oral - Regular -- Oral - Multi-Consistency -- Oral - Pill -- Oral Phase - Comment --  CHL IP PHARYNGEAL PHASE 01/08/2017 Pharyngeal Phase Impaired Pharyngeal- Pudding Teaspoon -- Pharyngeal -- Pharyngeal- Pudding Cup -- Pharyngeal -- Pharyngeal- Honey Teaspoon Delayed swallow initiation-pyriform sinuses;Reduced pharyngeal peristalsis;Reduced epiglottic inversion;Reduced anterior laryngeal mobility;Reduced laryngeal elevation;Reduced airway/laryngeal closure;Reduced tongue base retraction;Penetration/Aspiration before swallow;Penetration/Aspiration during swallow;Penetration/Apiration after swallow;Significant aspiration (Amount);Pharyngeal residue - valleculae;Pharyngeal residue - pyriform Pharyngeal -- Pharyngeal- Honey Cup -- Pharyngeal -- Pharyngeal- Nectar Teaspoon -- Pharyngeal -- Pharyngeal- Nectar Cup Delayed swallow initiation-pyriform sinuses;Reduced pharyngeal peristalsis;Reduced epiglottic inversion;Reduced anterior laryngeal  mobility;Reduced laryngeal elevation;Reduced airway/laryngeal closure;Reduced tongue base retraction;Penetration/Aspiration before swallow;Penetration/Aspiration during swallow;Penetration/Apiration after swallow;Significant aspiration (Amount);Pharyngeal residue - valleculae;Pharyngeal residue - pyriform Pharyngeal -- Pharyngeal- Nectar Straw -- Pharyngeal -- Pharyngeal- Thin Teaspoon -- Pharyngeal -- Pharyngeal- Thin Cup Delayed swallow initiation-pyriform sinuses;Reduced pharyngeal peristalsis;Reduced epiglottic inversion;Reduced anterior laryngeal mobility;Reduced laryngeal elevation;Reduced airway/laryngeal closure;Reduced tongue base retraction;Penetration/Aspiration before swallow;Penetration/Aspiration during swallow;Penetration/Apiration after swallow;Significant aspiration (Amount);Pharyngeal residue - valleculae;Pharyngeal residue - pyriform Pharyngeal -- Pharyngeal- Thin Straw -- Pharyngeal -- Pharyngeal- Puree Reduced pharyngeal peristalsis;Reduced epiglottic inversion;Reduced anterior laryngeal mobility;Reduced laryngeal elevation;Reduced airway/laryngeal closure;Reduced tongue base retraction;Penetration/Apiration after swallow;Significant aspiration (Amount);Pharyngeal residue - valleculae;Pharyngeal residue - pyriform;Delayed swallow initiation-vallecula Pharyngeal -- Pharyngeal- Mechanical Soft -- Pharyngeal -- Pharyngeal- Regular -- Pharyngeal -- Pharyngeal- Multi-consistency -- Pharyngeal -- Pharyngeal- Pill -- Pharyngeal -- Pharyngeal Comment --  CHL IP CERVICAL ESOPHAGEAL PHASE 01/08/2017 Cervical Esophageal Phase Impaired Pudding Teaspoon -- Pudding Cup -- Honey Teaspoon -- Honey Cup -- Nectar Teaspoon -- Nectar Cup -- Nectar Straw -- Thin Teaspoon -- Thin Cup -- Thin Straw -- Puree -- Mechanical Soft -- Regular -- Multi-consistency -- Pill -- Cervical Esophageal Comment reduced CP opening No flowsheet data found. Herbie [place], [place] CCC-SLP [phone] Lynann Beaver 01/08/2017, 10:54 AM               Scheduled Meds: . atorvastatin  20 mg Oral Daily  . budesonide (PULMICORT) nebulizer solution  0.5 mg Nebulization BID  . carvedilol  6.25 mg Oral BID WC  . ipratropium-albuterol  3 mL Nebulization QID  . multivitamin with minerals  1 tablet Oral Daily  . sodium chloride flush  3 mL Intravenous Q12H  . sodium chloride flush  3 mL Intravenous Q12H   Continuous Infusions: . sodium chloride    . sodium chloride    . ceFEPime (MAXIPIME) IV Stopped (01/08/17 1629)  . feeding supplement (JEVITY 1.2 CAL) 1,000 mL (01/08/17 1756)  . fluconazole (DIFLUCAN) IV    .  milrinone 0.25 mcg/kg/min (01/08/17 1221)  . nitroGLYCERIN 5 mcg/min (01/07/17 1822)  . vancomycin       LOS: 6 days   Vance Gather, MD Pager: Text Page via [email or web address]  [phone]   If 7PM-7AM, please contact night-coverage [email or web address] Password [facility] 01/08/2017, 7:52 PM

## 2017-01-08 NOTE — Evaluation (Signed)
Clinical/Bedside Swallow Evaluation Patient Details  Name: Collin Young MRN: [id number] Date of Birth: 04-26-1951  Today's Date: 01/08/2017 Time: SLP Start Time (ACUTE ONLY): 0858 SLP Stop Time (ACUTE ONLY): 0912 SLP Time Calculation (min) (ACUTE ONLY): 14 min  Past Medical History:  Past Medical History:  Diagnosis Date  . Cancer [facility])    Esophagus- radiation 2011  . GERD (gastroesophageal reflux disease)    takes zantac prn  . Headache   . History of radiation therapy 10/19/2013-12/05/2013   pyriform sinus 69.96 Gy/63fx  . HOH (hard of hearing)   . Hyperlipemia   . Hypertension   . Seizures ([place])    alcohol withdrawls- 2001   Past Surgical History:  Past Surgical History:  Procedure Laterality Date  . CARDIOVASCULAR STRESS TEST  10/09/09   normal nuclearr stress test, EF 57% [name])  . LARYNGOSCOPY N/A 09/15/2013   Procedure: LARYNGOSCOPY;  Surgeon: [name], MD;  Location: [facility];  Service: ENT;  Laterality: N/A;  direct laryngoscopy with biopsy and esophagoscopy  . NO PAST SURGERIES    . 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 CXR significant for pulmonary edema. Treated with IV lasix. Cardiology was consulted and patient underwent right heart cath on 5/18 in which revealed multivessel CAD with plans to do PCI of RCA next week if renal function is stable. On 5/10 patient became tachypneic and hypoxic, given 40 mg IV lasix with 31ml output, with improvement. CXR shows worsening airspace  opacities compatible with PNA or asymmetric pulmonary edema, mild cardiomegaly without vascular congestion. Pt was seen in OP SLP tx over 11 months, 4 visits. Pt reported that swallowing issues had resolved, but was  noncompliant with exercise program. Pt had G tube during RXT, it was removed in 2015.  In early 2016 SLP reported consistent throat clearing/cough observed with thin liquids, small sip chin tuck and effortful swallow x2 eliminated cough. No MBS or FEES on record.    Assessment / Plan / Recommendation Clinical Impression  Pt demonstrates concern for mild baseline dysphagia given history of throat/esophageal cancer and RXT. Pt confirms frequent coughing with liquid, demosntrates coughing when taking 3 oz of water consecutively and is suspected to have a possible pna. He will be undergoing further procedures and is at risk for immunocompromise and deconditioning. Recommend objective testing to determine best diet and precautions to reduce risk of aspiration.  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 CXR significant for pulmonary edema. Treated with IV lasix. Cardiology was consulted and patient underwent right heart cath on 5/18 in which revealed multivessel CAD with plans to do PCI of RCA next week if renal  function is stable. On 5/10 patient became tachypneic and hypoxic, given 40 mg IV lasix with 334ml output, with improvement. CXR shows worsening airspace opacities compatible with PNA or asymmetric pulmonary edema, mild cardiomegaly without vascular congestion. Pt was seen in OP SLP tx over 11 months, 4 visits. Pt reported that swallowing issues had resolved, but was  noncompliant with exercise program. Pt had G tube during RXT, it was removed in 2015.  In early 2016 SLP reported consistent throat clearing/cough observed with thin liquids, small sip chin tuck and effortful swallow x2 eliminated cough. No MBS or FEES on record.  Type of Study: Bedside Swallow Evaluation Previous Swallow Assessment: see HPI Diet Prior to this Study: Regular;Thin liquids Temperature Spikes Noted: No History of Recent Intubation: No Behavior/Cognition: Alert;Cooperative;Pleasant mood Oral Cavity Assessment: Within Functional Limits Oral Care Completed by SLP: No Oral Cavity - Dentition: Edentulous Vision: Functional for self-feeding Self-Feeding Abilities: Able to feed self Patient Positioning: Upright in chair Baseline Vocal Quality: Aphonic Volitional Cough: Strong    Oral/Motor/Sensory Function Overall Oral Motor/Sensory Function: Within functional limits   Ice Chips     Thin Liquid Thin Liquid: Impaired Presentation: Cup;Straw;Self Fed Pharyngeal  Phase Impairments: Decreased hyoid-laryngeal movement;Cough - Immediate    Nectar Thick Nectar Thick Liquid: Not tested   Honey Thick Honey Thick Liquid: Not tested   Puree Puree: Within functional limits Presentation: Self Fed;Spoon   Solid   GO   Solid: Not tested       Herbie Baltimore, MA CCC-SLP [phone]  Lynann Beaver 01/08/2017,9:19 AM

## 2017-01-08 NOTE — Progress Notes (Signed)
Initial Nutrition Assessment  DOCUMENTATION CODES:   Severe malnutrition in context of chronic illness  INTERVENTION:    Jevity 1.2 at 25 ml/h, increase by 10 ml every 4 hours to goal rate of 65 ml/h (1560 ml per day)  Provides 1872 kcal, 87 gm protein, 1264 ml free water daily (100% of estimated needs)  NUTRITION DIAGNOSIS:   Malnutrition (severe) related to chronic illness (CHF; cancer) as evidenced by severe depletion of muscle mass, percent weight loss (8% weight loss within 1 month).  GOAL:   Patient will meet greater than or equal to 90% of their needs  MONITOR:   TF tolerance, I & O's, Labs  REASON FOR ASSESSMENT:   Consult (Verbal Consult from Dr. 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 Presents to ED on 5/5 with progressive dyspnea for the last several weeks.   S/P right heart cath which revealed multivessel CAD; plans to do PCI of RCA next week if renal function is stable.  S/P swallow evaluation with SLP 5/11, severe dysphagia; Cortrak tube ordered. Nutrition-focused physical exam completed. Findings are mild-moderate fat depletion, severe muscle depletion, and no edema. Patient has lost 8% of his usual weight within the past 1-2 months. Patient with severe PCM.  Spoke with Dr. Bonner Puna, RD to order and manage TF. Labs reviewed: sodium 125 (L), magnesium 2.7 (H) Medications reviewed and include MVI.  Diet Order:  Diet NPO time specified  Skin:  Reviewed, no issues  Last BM:  5/10  Height:   Ht Readings from Last 1 Encounters:  01/02/17 5\' 6"  (1.676 m)    Weight:   Wt Readings from Last 1 Encounters:  01/08/17 121 lb 9.6 oz (55.2 kg)    Ideal Body Weight:  64.5 kg  BMI:  Body mass index  is 19.63 kg/m.  Estimated Nutritional Needs:   Kcal:  1700-1900  Protein:  80-90 gm  Fluid:  1.7 L  EDUCATION NEEDS:   No education needs identified at this time  Molli Barrows, [place], [place], [place] Pager [phone] After Hours Pager [phone]

## 2017-01-08 NOTE — Progress Notes (Signed)
Cortrak Tube Team Note:  Consult received to place a Cortrak feeding tube.   A 10 F Cortrak tube was placed in the right nare and secured with a nasal bridle at 50 cm. Per the Cortrak monitor reading the tube tip is stomach.  X-ray is required, abdominal x-ray has been ordered by the Cortrak team. Please confirm tube placement before using the Cortrak tube.   If the tube becomes dislodged please keep the tube and contact the Cortrak team at [email or web address] (password [email or web address]) for replacement.  If after hours and replacement cannot be delayed, place a NG tube and confirm placement with an abdominal x-ray.   Kerman Passey MS, RD, LDN [phone] Pager  [phone] Weekend/On-Call Pager

## 2017-01-08 NOTE — Consult Note (Signed)
[name] for Infectious Disease  Date of Admission:  01/02/2017  Date of Consult:  01/08/2017  Reason for Consult: MSSA Bacteremia Referring Physician: Dr. Becky Sax  Impression/Recommendation MSSA Bacteremia Acute on Chronic Hypoxic Respiratory Failure CHF exacerbation vs acute pulm edema vs PNA vs obstructive lung disease HFrEF CAD Thrush AKI on CKD Port  BCx 5/10 >> 1/2 MSSA  Patient with acute on chronic hypoxic respiratory distress requiring BiPAP (now off), currently on 4 L supplemental O2 via [place]. Possibly from volume overload vs underlying obstructive lung disease vs possible PNA. PCCM has seen and added Pulmicort and Duonebs. Cards diuresing with Lasix 20 mg IV BID, Milrinone drip added on. Repeat CXR today shows stable b/l airspace disease in a pulm edema or ARDS pattern. He has been afebrile last 48 hours without leukocytosis. Blood cultures from 5/10 are positive for MSSA. Currently on Vancomycin and Cefepime. - Continue Vanc/Cefepime for now (monitor renal fxn and trough) --> consider narrowing to Cefazolin in AM if he  continues to improve.  - Continue diuresis per cardiology; strict I/Os - Continue breathing treatments - Strict aspiration precautions - Repeat blood cultures tomorrow - follow CXR - start fluconcazole  Comment- Would be reasonable to pull port and check TEE in pt with better overall health. I am not sure he is a good candidate for this at this time.   Thank you so much for this interesting consult,   [name], MD Internal Medicine PGY-2 [email or web address]  HPI:  Collin Young is an 66 y.o. 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 Cardiology were consulted and patient  underwent a right heart cath on 5/8 which showed multivessel disease. Plans for staged PCI this week were held due to rising creatinine.  Patient desaturated on 5/10 with acute on chronic hypoxemic respiratory distress requiring BiPAP and was started on further diuresis by cardiology with improvement in symptoms. He was started on a NTG infusion and Milrinone. PCCM were consulted and he was started on Pulmicort and Duonebs. CXR was obtained and read as pneumonia vs pulmonary edema. He was started on broad spectrum Vancomycin and Cefepime for possible pneumonia. Patient has diuresed 1.4L in the last 24 hours that is documented. 1/2 blood cultures from 5/10 are positive for MSSA. He currently denies any fevers, chills, diaphoresis, chest pain, dyspnea, cough, or diarrhea. He is complaining of a right sided headache and reports a burning sensation with urination yesterday.      Past Medical History:  Diagnosis Date  . Cancer [facility])    Esophagus- radiation 2011  . GERD (gastroesophageal reflux disease)    takes zantac prn  . Headache   . History of radiation therapy 10/19/2013-12/05/2013   pyriform sinus 69.96 Gy/37f  . HOH (hard of hearing)   . Hyperlipemia   . Hypertension   . Seizures ([facility]    alcohol withdrawls- 2001    Past Surgical History:  Procedure Laterality Date  . CARDIOVASCULAR STRESS TEST  10/09/09   normal nuclearr stress test, EF 57% ([name]  . LARYNGOSCOPY N/A 09/15/2013   Procedure: LARYNGOSCOPY;  Surgeon: DMelida Quitter MD;  [place]  Service: ENT;  Laterality: N/A;  direct laryngoscopy with biopsy and esophagoscopy  . NO PAST SURGERIES    .  RIGHT/LEFT HEART CATH AND CORONARY ANGIOGRAPHY N/A 01/05/2017   Procedure: Right/Left Heart Cath and Coronary Angiography;  Surgeon: [name], MD;  Location: [place] [facility];  Service: Cardiovascular;  Laterality: N/A;     No Known Allergies  Medications: I have reviewed the patient's current  medications.  Abtx:  Anti-infectives    Start     Dose/Rate Route Frequency Ordered Stop   01/08/17 1600  vancomycin (VANCOCIN) 500 mg in sodium chloride 0.9 % 100 mL IVPB     500 mg 100 mL/hr over 60 Minutes Intravenous Every 24 hours 01/07/17 1449     01/07/17 1530  vancomycin (VANCOCIN) IVPB 1000 mg/200 mL premix     1,000 mg 200 mL/hr over 60 Minutes Intravenous  Once 01/07/17 1449 01/07/17 1719   01/07/17 1530  ceFEPIme (MAXIPIME) 1 g in dextrose 5 % 50 mL IVPB     1 g 100 mL/hr over 30 Minutes Intravenous Every 24 hours 01/07/17 1449        Total days of antibiotics: 1 Vanc/Cefepime 5/10 >>          Social History:  reports that he quit smoking about 17 years ago. His smoking use included Cigarettes. He started smoking about 56 years ago. He has a 40.00 pack-year smoking history. He quit smokeless tobacco use about 17 years ago. His smokeless tobacco use included Chew. He reports that he does not drink alcohol or use drugs.  History reviewed. No pertinent family history.  Review of Systems  Constitutional: Negative for chills, diaphoresis and fever.  HENT: Negative for sore throat.   Respiratory: Negative for cough, hemoptysis, sputum production and shortness of breath.   Cardiovascular: Negative for chest pain and leg swelling.  Gastrointestinal: Negative for abdominal pain, diarrhea, nausea and vomiting.  Genitourinary: Positive for dysuria and frequency.     Blood pressure 131/75, pulse 81, temperature 98.1 F (36.7 C), temperature source Oral, resp. rate 20, height 5' 6" (1.676 m), weight 121 lb 9.6 oz (55.2 kg), SpO2 99 %. Physical Exam  Constitutional: No distress.  Thin man, appears older than stated age  HENT:  Head: Normocephalic and atraumatic.  Oral thrush, cortrak tube in place, soft/hoarse voice  Eyes:  Arcus senilis  Cardiovascular: Normal rate and regular rhythm.   No murmur heard. Port right chest wall  Pulmonary/Chest: Effort normal. No  respiratory distress.  Coarse breath sounds bilaterally anteriorly  Abdominal: Soft. He exhibits no distension. There is no tenderness.  Musculoskeletal: He exhibits no edema or tenderness.  Skin: Skin is warm. He is not diaphoretic.  R chest Port is non-tender, no fluctuance.    Results for orders placed or performed during the hospital encounter of 01/02/17 (from the past 48 hour(s))  Basic metabolic panel     Status: Abnormal   Collection Time: 01/07/17  2:14 AM  Result Value Ref Range   Sodium 127 (L) 135 - 145 mmol/L   Potassium 5.1 3.5 - 5.1 mmol/L   Chloride 89 (L) 101 - 111 mmol/L   CO2 19 (L) 22 - 32 mmol/L   Glucose, Bld 123 (H) 65 - 99 mg/dL   BUN 52 (H) 6 - 20 mg/dL   Creatinine, Ser 2.01 (H) 0.61 - 1.24 mg/dL   Calcium 8.5 (L) 8.9 - 10.3 mg/dL   GFR calc non Af Amer 33 (L) >60 mL/min   GFR calc Af Amer 38 (L) >60 mL/min    Comment: (NOTE) The eGFR has been calculated using the CKD  EPI equation. This calculation has not been validated in all clinical situations. eGFR's persistently <60 mL/min signify possible Chronic Kidney Disease.    Anion gap 19 (H) 5 - 15  Culture, blood (Routine X 2) w Reflex to ID Panel     Status: None (Preliminary result)   Collection Time: 01/07/17  3:43 PM  Result Value Ref Range   Specimen Description BLOOD RIGHT ANTECUBITAL    Special Requests IN PEDIATRIC BOTTLE Blood Culture adequate volume    Culture NO GROWTH < 24 HOURS    Report Status PENDING   Culture, blood (Routine X 2) w Reflex to ID Panel     Status: None (Preliminary result)   Collection Time: 01/07/17  3:47 PM  Result Value Ref Range   Specimen Description BLOOD RIGHT HAND    Special Requests IN PEDIATRIC BOTTLE Blood Culture adequate volume    Culture  Setup Time      GRAM POSITIVE COCCI IN CLUSTERS IN PEDIATRIC BOTTLE Organism ID to follow CRITICAL RESULT CALLED TO, READ BACK BY AND VERIFIED WITH: MVonita Moss.D. 13:30 01/08/17 ([email or web address])    Culture NO GROWTH < 24  HOURS    Report Status PENDING   Blood Culture ID Panel (Reflexed)     Status: Abnormal   Collection Time: 01/07/17  3:47 PM  Result Value Ref Range   Enterococcus species NOT DETECTED NOT DETECTED   Listeria monocytogenes NOT DETECTED NOT DETECTED   Staphylococcus species DETECTED (A) NOT DETECTED    Comment: CRITICAL RESULT CALLED TO, READ BACK BY AND VERIFIED WITH: M. Vonita Moss.D. 13:30 01/08/17 ([email or web address])    Staphylococcus aureus DETECTED (A) NOT DETECTED    Comment: Methicillin (oxacillin) susceptible Staphylococcus aureus (MSSA). Preferred therapy is anti staphylococcal beta lactam antibiotic (Cefazolin or Nafcillin), unless clinically contraindicated. CRITICAL RESULT CALLED TO, READ BACK BY AND VERIFIED WITH: M. Radford Pax Pharm.D. 13:30 01/08/17 ([email or web address])    Methicillin resistance NOT DETECTED NOT DETECTED   Streptococcus species NOT DETECTED NOT DETECTED   Streptococcus agalactiae NOT DETECTED NOT DETECTED   Streptococcus pneumoniae NOT DETECTED NOT DETECTED   Streptococcus pyogenes NOT DETECTED NOT DETECTED   Acinetobacter baumannii NOT DETECTED NOT DETECTED   Enterobacteriaceae species NOT DETECTED NOT DETECTED   Enterobacter cloacae complex NOT DETECTED NOT DETECTED   Escherichia coli NOT DETECTED NOT DETECTED   Klebsiella oxytoca NOT DETECTED NOT DETECTED   Klebsiella pneumoniae NOT DETECTED NOT DETECTED   Proteus species NOT DETECTED NOT DETECTED   Serratia marcescens NOT DETECTED NOT DETECTED   Haemophilus influenzae NOT DETECTED NOT DETECTED   Neisseria meningitidis NOT DETECTED NOT DETECTED   Pseudomonas aeruginosa NOT DETECTED NOT DETECTED   Candida albicans NOT DETECTED NOT DETECTED   Candida glabrata NOT DETECTED NOT DETECTED   Candida krusei NOT DETECTED NOT DETECTED   Candida parapsilosis NOT DETECTED NOT DETECTED   Candida tropicalis NOT DETECTED NOT DETECTED  Blood gas, arterial     Status: Abnormal   Collection Time: 01/07/17  4:05 PM  Result Value Ref  Range   FIO2 30.00    Delivery systems VENTURI MASK    pH, Arterial 7.477 (H) 7.350 - 7.450   pCO2 arterial 25.9 (L) 32.0 - 48.0 mmHg   pO2, Arterial 70.1 (L) 83.0 - 108.0 mmHg   Bicarbonate 18.9 (L) 20.0 - 28.0 mmol/L   Acid-base deficit 4.1 (H) 0.0 - 2.0 mmol/L   O2 Saturation 92.7 %   Patient temperature 98.6    Collection site LEFT RADIAL    Drawn  by [id number]    Sample type ARTERIAL DRAW    Allens test (pass/fail) PASS PASS  MRSA PCR Screening     Status: None   Collection Time: 01/07/17  5:21 PM  Result Value Ref Range   MRSA by PCR NEGATIVE NEGATIVE    Comment:        The GeneXpert MRSA Assay (FDA approved for NASAL specimens only), is one component of a comprehensive MRSA colonization surveillance program. It is not intended to diagnose MRSA infection nor to guide or monitor treatment for MRSA infections.   Procalcitonin - Baseline     Status: None   Collection Time: 01/07/17  8:35 PM  Result Value Ref Range   Procalcitonin 10.85 ng/mL    Comment:        Interpretation: PCT >= 10 ng/mL: Important systemic inflammatory response, almost exclusively due to severe bacterial sepsis or septic shock. (NOTE)         ICU PCT Algorithm               Non ICU PCT Algorithm    ----------------------------     ------------------------------         PCT < 0.25 ng/mL                 PCT < 0.1 ng/mL     Stopping of antibiotics            Stopping of antibiotics       strongly encouraged.               strongly encouraged.    ----------------------------     ------------------------------       PCT level decrease by               PCT < 0.25 ng/mL       >= 80% from peak PCT       OR PCT 0.25 - 0.5 ng/mL          Stopping of antibiotics                                             encouraged.     Stopping of antibiotics           encouraged.    ----------------------------     ------------------------------       PCT level decrease by              PCT >= 0.25 ng/mL       < 80%  from peak PCT        AND PCT >= 0.5 ng/mL             Continuing antibiotics                                              encouraged.       Continuing antibiotics            encouraged.    ----------------------------     ------------------------------     PCT level increase compared          PCT > 0.5 ng/mL         with peak PCT AND          PCT >= 0.5 ng/mL  Escalation of antibiotics                                          strongly encouraged.      Escalation of antibiotics        strongly encouraged.   Magnesium     Status: Abnormal   Collection Time: 01/07/17  8:35 PM  Result Value Ref Range   Magnesium 2.5 (H) 1.7 - 2.4 mg/dL  Phosphorus     Status: None   Collection Time: 01/07/17  8:35 PM  Result Value Ref Range   Phosphorus 2.8 2.5 - 4.6 mg/dL  .Cooxemetry Panel (carboxy, met, total hgb, O2 sat)     Status: None   Collection Time: 01/07/17  8:45 PM  Result Value Ref Range   Total hemoglobin 12.6 12.0 - 16.0 g/dL   O2 Saturation 48.1 %   Carboxyhemoglobin 1.1 0.5 - 1.5 %   Methemoglobin 0.7 0.0 - 1.5 %  Basic metabolic panel     Status: Abnormal   Collection Time: 01/08/17  4:10 AM  Result Value Ref Range   Sodium 125 (L) 135 - 145 mmol/L   Potassium 4.4 3.5 - 5.1 mmol/L   Chloride 86 (L) 101 - 111 mmol/L   CO2 27 22 - 32 mmol/L   Glucose, Bld 91 65 - 99 mg/dL   BUN 66 (H) 6 - 20 mg/dL   Creatinine, Ser 2.02 (H) 0.61 - 1.24 mg/dL   Calcium 8.3 (L) 8.9 - 10.3 mg/dL   GFR calc non Af Amer 33 (L) >60 mL/min   GFR calc Af Amer 38 (L) >60 mL/min    Comment: (NOTE) The eGFR has been calculated using the CKD EPI equation. This calculation has not been validated in all clinical situations. eGFR's persistently <60 mL/min signify possible Chronic Kidney Disease.    Anion gap 12 5 - 15  CBC     Status: Abnormal   Collection Time: 01/08/17  4:10 AM  Result Value Ref Range   WBC 8.1 4.0 - 10.5 K/uL   RBC 4.11 (L) 4.22 - 5.81 MIL/uL   Hemoglobin 11.5 (L)  13.0 - 17.0 g/dL   HCT 34.8 (L) 39.0 - 52.0 %   MCV 84.7 78.0 - 100.0 fL   MCH 28.0 26.0 - 34.0 pg   MCHC 33.0 30.0 - 36.0 g/dL   RDW 13.7 11.5 - 15.5 %   Platelets 144 (L) 150 - 400 K/uL  Magnesium     Status: Abnormal   Collection Time: 01/08/17  4:10 AM  Result Value Ref Range   Magnesium 2.7 (H) 1.7 - 2.4 mg/dL  Phosphorus     Status: None   Collection Time: 01/08/17  4:10 AM  Result Value Ref Range   Phosphorus 3.5 2.5 - 4.6 mg/dL  Cooxemetry Panel (carboxy, met, total hgb, O2 sat)     Status: Abnormal   Collection Time: 01/08/17 12:14 PM  Result Value Ref Range   Total hemoglobin 11.6 (L) 12.0 - 16.0 g/dL   O2 Saturation 52.4 %   Carboxyhemoglobin 0.9 0.5 - 1.5 %   Methemoglobin 1.1 0.0 - 1.5 %      Component Value Date/Time   SDES BLOOD RIGHT HAND 01/07/2017 1547   SPECREQUEST IN PEDIATRIC BOTTLE Blood Culture adequate volume 01/07/2017 1547   CULT NO GROWTH < 24 HOURS 01/07/2017 1547   REPTSTATUS PENDING 01/07/2017 1547  Dg Chest 2 View  Result Date: 01/07/2017 CLINICAL DATA:  Street shortness of breath, follow-up from chest x-ray of Jan 02, 2017 at which time pulmonary edema or less likely pneumonia was suspected. EXAM: CHEST  2 VIEW COMPARISON:  PA and lateral chest x-ray of Jan 02, 2017 FINDINGS: The lungs are adequately inflated. There are increased confluent alveolar opacities in the right mid and lower lung and in the left mid and lower lung. The interstitial markings are mildly increased. The cardiac silhouette remains enlarged. There is no significant left pleural effusion. There is a small right pleural effusion. There is calcification in the wall of the aortic arch. The power port catheter tip projects over the junction of the middle and distal thirds of the SVC. IMPRESSION: Worsening of airspace opacities compatible with pneumonia or asymmetric pulmonary edema though I favor the former. Mild cardiomegaly without pulmonary vascular congestion. Thoracic aortic  atherosclerosis. Electronically Signed   By: David  Martinique M.D.   On: 01/07/2017 14:06   Dg Abd 1 View  Result Date: 01/08/2017 CLINICAL DATA:  Feeding tube placement. EXAM: ABDOMEN - 1 VIEW COMPARISON:  12/03/2013 FINDINGS: Enteric feeding tube tip projects in the stomach. There is residual contrast within the stomach, also within the small bowel and colon. Interstitial hazy airspace opacities are noted in the visualized lower lungs, with evidence of small pleural effusions. IMPRESSION: 1. Enteric feeding tube tip projects in the mid stomach. Electronically Signed   By: Lajean Manes M.D.   On: 01/08/2017 15:47   Dg Chest Port 1 View  Result Date: 01/08/2017 CLINICAL DATA:  Respiratory failure.  Esophageal cancer. EXAM: PORTABLE CHEST 1 VIEW COMPARISON:  Yesterday FINDINGS: Bilateral central and basilar airspace disease is not significantly changed allowing for differences in patient position. Heart is upper normal in size. Right jugular Port-A-Cath is stable. No pneumothorax. IMPRESSION: Stable bilateral airspace disease in a pulmonary edema or ARDS pattern. Electronically Signed   By: Marybelle Killings M.D.   On: 01/08/2017 07:45   Dg Swallowing Func-speech Pathology  Result Date: 01/08/2017 Objective Swallowing Evaluation: Type of Study: MBS-Modified Barium Swallow Study Patient Details Name: SAYYID HAREWOOD MRN: [id number] Date of Birth: March 09, 1951 Today's Date: 01/08/2017 Time: SLP Start Time (ACUTE ONLY): 1000-SLP Stop Time (ACUTE ONLY): 1030 SLP Time Calculation (min) (ACUTE ONLY): 30 min Past Medical History: Past Medical History: Diagnosis Date . Cancer [facility])   Esophagus- radiation 2011 . GERD (gastroesophageal reflux disease)   takes zantac prn . Headache  . History of radiation therapy 10/19/2013-12/05/2013  pyriform sinus 69.96 Gy/62f . HOH (hard of hearing)  . Hyperlipemia  . Hypertension  . [place]   alcohol withdrawls- 2001 Past Surgical History: Past Surgical History: Procedure Laterality  Date . CARDIOVASCULAR STRESS TEST  10/09/09  normal nuclearr stress test, EF 57% ([name] . LARYNGOSCOPY N/A 09/15/2013  Procedure: LARYNGOSCOPY;  Surgeon: DMelida Quitter MD;  [place] [facility]  Service: ENT;  Laterality: N/A;  direct laryngoscopy with biopsy and esophagoscopy . NO PAST SURGERIES   . RIGHT/LEFT HEART CATH AND CORONARY ANGIOGRAPHY N/A 01/05/2017  Procedure: Right/Left Heart Cath and Coronary Angiography;  Surgeon: MBurnell Blanks MD;  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 CXR significant for pulmonary edema. Treated with IV lasix. Cardiology was consulted and patient underwent right heart cath on 5/18 in which revealed multivessel CAD with plans to do PCI of RCA next week if renal function is stable. On 5/10 patient became tachypneic and hypoxic, given 40 mg IV lasix with 37m output, with improvement. CXR shows worsening airspace opacities compatible with PNA or asymmetric pulmonary edema, mild cardiomegaly without vascular congestion. Pt was seen in OP SLP tx over 11 months, 4 visits. Pt reported that swallowing issues had resolved, but was  noncompliant with exercise program. Pt had G tube during RXT, it was removed in 2015.  In early 2016 SLP reported consistent throat clearing/cough observed with thin liquids, small sip chin tuck and effortful swallow x2 eliminated cough. No MBS or FEES on record.  No Data Recorded Assessment / Plan / Recommendation CHL IP CLINICAL IMPRESSIONS 01/08/2017 Clinical Impression Pt demonstrates severe to profound oropharyngeal dysphagia secondary to fibrotic musculature following RXT in 2015 without compliance to recommendations for  exercise program. Due to severe lack of mobility of hyolaryngeal complex and pharyngeal musculature there is minimal ability to protect airway during the swallow leading to immediate severe penetration to the cords before/during the swallow. Pt does appear to achieve moderate glottic closure to hold penetrates in the vestibule, but as soon as he releases the swallow, they spill into trachea. There is absent sensation from pt. In addition, due to lack of pharyngeal peristalsis and hyolaryngeal excursion, at least 50% of the bolus remains pooled in the vallecula and pyriforms post swallow, with increased severity with thick liquids. Attempted all positional compensations without success. Best strategy was a super supraglottic swallow to capitilaze on glottic closure and expectoration of penetrate. Even with this strategy, severe aspiration occurs before and after attempts. Pt verbalizes desire to continue all treatments to prolong life and would not want to accept risk of aspiration. Given dx of pna and need for further medical interventions, aggressive prevention of aspiration of PO is recommended. Suggest short term alternate means of nutrition until cardiac procedure is complete and pts respiratory status is somewhat improved.  Recommend ice chips after oral care only at this time.  Will plan to retest next week, but expect poor spontaneous recovery or potential to improve with therapy over the short term given length of time since RXT and severity of impairment.  SLP Visit Diagnosis Dysphagia, oropharyngeal phase (R13.12) Attention and concentration deficit following -- Frontal lobe and executive function deficit following -- Impact on safety and function Severe aspiration risk;Risk for inadequate nutrition/hydration   CHL IP TREATMENT RECOMMENDATION 01/08/2017 Treatment Recommendations Therapy as outlined in treatment plan below   Prognosis 01/08/2017 Prognosis for Safe Diet Advancement Guarded Barriers to Reach  Goals Severity of deficits;Time post onset Barriers/Prognosis Comment -- CHL IP DIET RECOMMENDATION 01/08/2017 SLP Diet Recommendations NPO;Alternative means - temporary;Ice chips PRN after oral care Liquid Administration via -- Medication Administration Via alternative means Compensations -- Postural Changes --   CHL IP OTHER RECOMMENDATIONS 01/08/2017 Recommended Consults -- Oral Care Recommendations Oral care before and after PO Other Recommendations --   CHL IP FOLLOW UP RECOMMENDATIONS 01/08/2017 Follow up Recommendations [facility] D[facility] 01/08/2017 Speech Therapy Frequency (ACUTE ONLY) min 2x/week Treatment Duration 2 weeks      CHL IP ORAL PHASE 01/08/2017 Oral Phase Impaired Oral - Pudding Teaspoon -- Oral - Pudding Cup -- Oral - Honey Teaspoon -- Oral - Honey Cup --  Oral - Nectar Teaspoon -- Oral - Nectar Cup -- Oral - Nectar Straw -- Oral - Thin Teaspoon -- Oral - Thin Cup -- Oral - Thin Straw -- Oral - Puree -- Oral - Mech Soft -- Oral - Regular -- Oral - Multi-Consistency -- Oral - Pill -- Oral Phase - Comment --  CHL IP PHARYNGEAL PHASE 01/08/2017 Pharyngeal Phase Impaired Pharyngeal- Pudding Teaspoon -- Pharyngeal -- Pharyngeal- Pudding Cup -- Pharyngeal -- Pharyngeal- Honey Teaspoon Delayed swallow initiation-pyriform sinuses;Reduced pharyngeal peristalsis;Reduced epiglottic inversion;Reduced anterior laryngeal mobility;Reduced laryngeal elevation;Reduced airway/laryngeal closure;Reduced tongue base retraction;Penetration/Aspiration before swallow;Penetration/Aspiration during swallow;Penetration/Apiration after swallow;Significant aspiration (Amount);Pharyngeal residue - valleculae;Pharyngeal residue - pyriform Pharyngeal -- Pharyngeal- Honey Cup -- Pharyngeal -- Pharyngeal- Nectar Teaspoon -- Pharyngeal -- Pharyngeal- Nectar Cup Delayed swallow initiation-pyriform sinuses;Reduced pharyngeal peristalsis;Reduced epiglottic inversion;Reduced anterior laryngeal  mobility;Reduced laryngeal elevation;Reduced airway/laryngeal closure;Reduced tongue base retraction;Penetration/Aspiration before swallow;Penetration/Aspiration during swallow;Penetration/Apiration after swallow;Significant aspiration (Amount);Pharyngeal residue - valleculae;Pharyngeal residue - pyriform Pharyngeal -- Pharyngeal- Nectar Straw -- Pharyngeal -- Pharyngeal- Thin Teaspoon -- Pharyngeal -- Pharyngeal- Thin Cup Delayed swallow initiation-pyriform sinuses;Reduced pharyngeal peristalsis;Reduced epiglottic inversion;Reduced anterior laryngeal mobility;Reduced laryngeal elevation;Reduced airway/laryngeal closure;Reduced tongue base retraction;Penetration/Aspiration before swallow;Penetration/Aspiration during swallow;Penetration/Apiration after swallow;Significant aspiration (Amount);Pharyngeal residue - valleculae;Pharyngeal residue - pyriform Pharyngeal -- Pharyngeal- Thin Straw -- Pharyngeal -- Pharyngeal- Puree Reduced pharyngeal peristalsis;Reduced epiglottic inversion;Reduced anterior laryngeal mobility;Reduced laryngeal elevation;Reduced airway/laryngeal closure;Reduced tongue base retraction;Penetration/Apiration after swallow;Significant aspiration (Amount);Pharyngeal residue - valleculae;Pharyngeal residue - pyriform;Delayed swallow initiation-vallecula Pharyngeal -- Pharyngeal- Mechanical Soft -- Pharyngeal -- Pharyngeal- Regular -- Pharyngeal -- Pharyngeal- Multi-consistency -- Pharyngeal -- Pharyngeal- Pill -- Pharyngeal -- Pharyngeal Comment --  CHL IP CERVICAL ESOPHAGEAL PHASE 01/08/2017 Cervical Esophageal Phase Impaired Pudding Teaspoon -- Pudding Cup -- Honey Teaspoon -- Honey Cup -- Nectar Teaspoon -- Nectar Cup -- Nectar Straw -- Thin Teaspoon -- Thin Cup -- Thin Straw -- Puree -- Mechanical Soft -- Regular -- Multi-consistency -- Pill -- Cervical Esophageal Comment reduced CP opening No flowsheet data found. Herbie [place], [place] CCC-SLP [phone] Lynann Beaver 01/08/2017, 10:54 AM               Recent Results (from the past 240 hour(s))  Culture, blood (Routine X 2) w Reflex to ID Panel     Status: None (Preliminary result)   Collection Time: 01/07/17  3:43 PM  Result Value Ref Range Status   Specimen Description BLOOD RIGHT ANTECUBITAL  Final   Special Requests IN PEDIATRIC BOTTLE Blood Culture adequate volume  Final   Culture NO GROWTH < 24 HOURS  Final   Report Status PENDING  Incomplete  Culture, blood (Routine X 2) w Reflex to ID Panel     Status: None (Preliminary result)   Collection Time: 01/07/17  3:47 PM  Result Value Ref Range Status   Specimen Description BLOOD RIGHT HAND  Final   Special Requests IN PEDIATRIC BOTTLE Blood Culture adequate volume  Final   Culture  Setup Time   Final    GRAM POSITIVE COCCI IN CLUSTERS IN PEDIATRIC BOTTLE Organism ID to follow CRITICAL RESULT CALLED TO, READ BACK BY AND VERIFIED WITH: MVonita Moss.D. 13:30 01/08/17 ([email or web address])    Culture NO GROWTH < 24 HOURS  Final   Report Status PENDING  Incomplete  Blood Culture ID Panel (Reflexed)     Status: Abnormal   Collection Time: 01/07/17  3:47 PM  Result Value Ref Range Status   Enterococcus species NOT DETECTED NOT DETECTED Final   Listeria monocytogenes NOT DETECTED NOT DETECTED Final   Staphylococcus  species DETECTED (A) NOT DETECTED Final    Comment: CRITICAL RESULT CALLED TO, READ BACK BY AND VERIFIED WITH: M. Radford Pax Pharm.D. 13:30 01/08/17 ([email or web address])    Staphylococcus aureus DETECTED (A) NOT DETECTED Final    Comment: Methicillin (oxacillin) susceptible Staphylococcus aureus (MSSA). Preferred therapy is anti staphylococcal beta lactam antibiotic (Cefazolin or Nafcillin), unless clinically contraindicated. CRITICAL RESULT CALLED TO, READ BACK BY AND VERIFIED WITH: M. Radford Pax Pharm.D. 13:30 01/08/17 ([email or web address])    Methicillin resistance NOT DETECTED NOT DETECTED Final   Streptococcus species NOT DETECTED NOT DETECTED Final   Streptococcus agalactiae NOT DETECTED NOT  DETECTED Final   Streptococcus pneumoniae NOT DETECTED NOT DETECTED Final   Streptococcus pyogenes NOT DETECTED NOT DETECTED Final   Acinetobacter baumannii NOT DETECTED NOT DETECTED Final   Enterobacteriaceae species NOT DETECTED NOT DETECTED Final   Enterobacter cloacae complex NOT DETECTED NOT DETECTED Final   Escherichia coli NOT DETECTED NOT DETECTED Final   Klebsiella oxytoca NOT DETECTED NOT DETECTED Final   Klebsiella pneumoniae NOT DETECTED NOT DETECTED Final   Proteus species NOT DETECTED NOT DETECTED Final   Serratia marcescens NOT DETECTED NOT DETECTED Final   Haemophilus influenzae NOT DETECTED NOT DETECTED Final   Neisseria meningitidis NOT DETECTED NOT DETECTED Final   Pseudomonas aeruginosa NOT DETECTED NOT DETECTED Final   Candida albicans NOT DETECTED NOT DETECTED Final   Candida glabrata NOT DETECTED NOT DETECTED Final   Candida krusei NOT DETECTED NOT DETECTED Final   Candida parapsilosis NOT DETECTED NOT DETECTED Final   Candida tropicalis NOT DETECTED NOT DETECTED Final  MRSA PCR Screening     Status: None   Collection Time: 01/07/17  5:21 PM  Result Value Ref Range Status   MRSA by PCR NEGATIVE NEGATIVE Final    Comment:        The GeneXpert MRSA Assay (FDA approved for NASAL specimens only), is one component of a comprehensive MRSA colonization surveillance program. It is not intended to diagnose MRSA infection nor to guide or monitor treatment for MRSA infections.       01/08/2017, 4:41 PM     LOS: 6 days    Records and images were personally reviewed where available.

## 2017-01-09 ENCOUNTER — Inpatient Hospital Stay (HOSPITAL_COMMUNITY): Payer: Medicare Other

## 2017-01-09 DIAGNOSIS — Z515 Encounter for palliative care: Secondary | ICD-10-CM

## 2017-01-09 DIAGNOSIS — Y95 Nosocomial condition: Secondary | ICD-10-CM

## 2017-01-09 DIAGNOSIS — C159 Malignant neoplasm of esophagus, unspecified: Secondary | ICD-10-CM

## 2017-01-09 DIAGNOSIS — J189 Pneumonia, unspecified organism: Secondary | ICD-10-CM

## 2017-01-09 LAB — COOXEMETRY PANEL
Carboxyhemoglobin: 0.9 % (ref 0.5–1.5)
Methemoglobin: 0.9 % (ref 0.0–1.5)
O2 Saturation: 57.2 %
Total hemoglobin: 11.7 g/dL — ABNORMAL LOW (ref 12.0–16.0)

## 2017-01-09 LAB — PROCALCITONIN: Procalcitonin: 10.87 ng/mL

## 2017-01-09 LAB — BASIC METABOLIC PANEL
Anion gap: 8 (ref 5–15)
BUN: 47 mg/dL — ABNORMAL HIGH (ref 6–20)
CO2: 32 mmol/L (ref 22–32)
Calcium: 8 mg/dL — ABNORMAL LOW (ref 8.9–10.3)
Chloride: 88 mmol/L — ABNORMAL LOW (ref 101–111)
Creatinine, Ser: 1.33 mg/dL — ABNORMAL HIGH (ref 0.61–1.24)
GFR calc Af Amer: >60 mL/min (ref >60)
GFR calc non Af Amer: 54 mL/min — ABNORMAL LOW (ref >60)
Glucose, Bld: 122 mg/dL — ABNORMAL HIGH (ref 65–99)
Potassium: 3.9 mmol/L (ref 3.5–5.1)
Sodium: 128 mmol/L — ABNORMAL LOW (ref 135–145)

## 2017-01-09 LAB — GLUCOSE, CAPILLARY
GLUCOSE-CAPILLARY: 92 mg/dL (ref 65–99)
GLUCOSE-CAPILLARY: 174 mg/dL — AB (ref 65–99)
Glucose-Capillary: 122 mg/dL — ABNORMAL HIGH (ref 65–99)
Glucose-Capillary: 140 mg/dL — ABNORMAL HIGH (ref 65–99)
Glucose-Capillary: 185 mg/dL — ABNORMAL HIGH (ref 65–99)

## 2017-01-09 LAB — PSA, TOTAL AND FREE
PSA FREE: 0.24 ng/mL
PSA, Free Pct: 6.9 %
Prostate Specific Ag, Serum: 3.5 ng/mL (ref 0.0–4.0)

## 2017-01-09 MED ORDER — ZOLPIDEM TARTRATE 5 MG PO TABS
5.0000 mg | ORAL_TABLET | Freq: Once | ORAL | Status: AC
  Administered 2017-01-09: 5 mg via ORAL
  Filled 2017-01-09: qty 1

## 2017-01-09 MED ORDER — IPRATROPIUM-ALBUTEROL 0.5-2.5 (3) MG/3ML IN SOLN
3.0000 mL | Freq: Three times a day (TID) | RESPIRATORY_TRACT | Status: DC
  Administered 2017-01-09: 3 mL via RESPIRATORY_TRACT
  Filled 2017-01-09: qty 3

## 2017-01-09 MED ORDER — IPRATROPIUM-ALBUTEROL 0.5-2.5 (3) MG/3ML IN SOLN
3.0000 mL | RESPIRATORY_TRACT | Status: DC | PRN
  Administered 2017-01-12 – 2017-01-14 (×2): 3 mL via RESPIRATORY_TRACT
  Filled 2017-01-09 (×2): qty 3

## 2017-01-09 NOTE — Progress Notes (Addendum)
INFECTIOUS DISEASE PROGRESS NOTE  ID: Collin Young is a 66 y.o. male with  Active Problems:   Malignant neoplasm of pyriform sinus (HCC)   Acute on chronic combined systolic and diastolic CHF (congestive heart failure) (HCC)   AKI (acute kidney injury) ([place])   Acute pulmonary edema (HCC)   Congestive dilated cardiomyopathy (HCC)   Acute congestive heart failure (HCC)   Ischemic cardiomyopathy   Acute hypoxemic respiratory failure (HCC)  Subjective: States neck sore from tube  Abtx:  Anti-infectives    Start     Dose/Rate Route Frequency Ordered Stop   01/08/17 1730  fluconazole (DIFLUCAN) IVPB 100 mg  Status:  Discontinued     100 mg 50 mL/hr over 60 Minutes Intravenous Every 24 hours 01/08/17 1725 01/08/17 1725   01/08/17 1730  fluconazole (DIFLUCAN) IVPB 100 mg     100 mg 50 mL/hr over 60 Minutes Intravenous Every 24 hours 01/08/17 1725 01/15/17 1729   01/08/17 1600  vancomycin (VANCOCIN) 500 mg in sodium chloride 0.9 % 100 mL IVPB  Status:  Discontinued     500 mg 100 mL/hr over 60 Minutes Intravenous Every 24 hours 01/07/17 1449 01/09/17 0829   01/07/17 1530  vancomycin (VANCOCIN) IVPB 1000 mg/200 mL premix     1,000 mg 200 mL/hr over 60 Minutes Intravenous  Once 01/07/17 1449 01/07/17 1719   01/07/17 1530  ceFEPIme (MAXIPIME) 1 g in dextrose 5 % 50 mL IVPB     1 g 100 mL/hr over 30 Minutes Intravenous Every 24 hours 01/07/17 1449        Medications:  Scheduled: . atorvastatin  20 mg Oral Daily  . budesonide (PULMICORT) nebulizer solution  0.5 mg Nebulization BID  . carvedilol  6.25 mg Oral BID WC  . multivitamin with minerals  1 tablet Oral Daily  . sodium chloride flush  3 mL Intravenous Q12H    Objective: Vital signs in last 24 hours: Temp:  [98.1 F (36.7 C)-98.4 F (36.9 C)] 98.4 F (36.9 C) (05/12 0817) Pulse Rate:  [80-85] 80 (05/12 0817) Resp:  [16-22] 19 (05/12 0300) BP: (114-131)/(63-75) 114/73 (05/12 0817) SpO2:  [98 %-100 %] 100 % (05/12  0905) Weight:  [55.2 kg (121 lb 11.2 oz)] 55.2 kg (121 lb 11.2 oz) (05/12 0500)   General appearance: alert, cooperative, fatigued and no distress Throat: normal findings: oropharynx pink & moist without lesions or evidence of thrush Resp: rhonchi anterior - bilateral Chest wall: no tenderness, or fluctuance around port.  Cardio: regular rate and rhythm GI: normal findings: bowel sounds normal and soft, non-tender Extremities: edema none  Lab Results  Recent Labs  01/08/17 0410 01/09/17 0451  WBC 8.1  --   HGB 11.5*  --   HCT 34.8*  --   NA 125* 128*  K 4.4 3.9  CL 86* 88*  CO2 27 32  BUN 66* 47*  CREATININE 2.02* 1.33*   Liver Panel No results for input(s): PROT, ALBUMIN, AST, ALT, ALKPHOS, BILITOT, BILIDIR, IBILI in the last 72 hours. Sedimentation Rate No results for input(s): ESRSEDRATE in the last 72 hours. C-Reactive Protein No results for input(s): CRP in the last 72 hours.  Microbiology: Recent Results (from the past 240 hour(s))  Culture, blood (Routine X 2) w Reflex to ID Panel     Status: None (Preliminary result)   Collection Time: 01/07/17  3:43 PM  Result Value Ref Range Status   Specimen Description BLOOD RIGHT ANTECUBITAL  Final   Special Requests  IN PEDIATRIC BOTTLE Blood Culture adequate volume  Final   Culture NO GROWTH < 24 HOURS  Final   Report Status PENDING  Incomplete  Culture, blood (Routine X 2) w Reflex to ID Panel     Status: Abnormal (Preliminary result)   Collection Time: 01/07/17  3:47 PM  Result Value Ref Range Status   Specimen Description BLOOD RIGHT HAND  Final   Special Requests IN PEDIATRIC BOTTLE Blood Culture adequate volume  Final   Culture  Setup Time   Final    GRAM POSITIVE COCCI IN CLUSTERS IN PEDIATRIC BOTTLE CRITICAL RESULT CALLED TO, READ BACK BY AND VERIFIED WITH: Leonie Green Pharm.D. 13:30 01/08/17 ([email or web address])    Culture (A)  Final    STAPHYLOCOCCUS AUREUS SUSCEPTIBILITIES TO FOLLOW    Report Status PENDING   Incomplete  Blood Culture ID Panel (Reflexed)     Status: Abnormal   Collection Time: 01/07/17  3:47 PM  Result Value Ref Range Status   Enterococcus species NOT DETECTED NOT DETECTED Final   Listeria monocytogenes NOT DETECTED NOT DETECTED Final   Staphylococcus species DETECTED (A) NOT DETECTED Final    Comment: CRITICAL RESULT CALLED TO, READ BACK BY AND VERIFIED WITH: M. Vonita Moss.D. 13:30 01/08/17 ([email or web address])    Staphylococcus aureus DETECTED (A) NOT DETECTED Final    Comment: Methicillin (oxacillin) susceptible Staphylococcus aureus (MSSA). Preferred therapy is anti staphylococcal beta lactam antibiotic (Cefazolin or Nafcillin), unless clinically contraindicated. CRITICAL RESULT CALLED TO, READ BACK BY AND VERIFIED WITH: M. Radford Pax Pharm.D. 13:30 01/08/17 ([email or web address])    Methicillin resistance NOT DETECTED NOT DETECTED Final   Streptococcus species NOT DETECTED NOT DETECTED Final   Streptococcus agalactiae NOT DETECTED NOT DETECTED Final   Streptococcus pneumoniae NOT DETECTED NOT DETECTED Final   Streptococcus pyogenes NOT DETECTED NOT DETECTED Final   Acinetobacter baumannii NOT DETECTED NOT DETECTED Final   Enterobacteriaceae species NOT DETECTED NOT DETECTED Final   Enterobacter cloacae complex NOT DETECTED NOT DETECTED Final   Escherichia coli NOT DETECTED NOT DETECTED Final   Klebsiella oxytoca NOT DETECTED NOT DETECTED Final   Klebsiella pneumoniae NOT DETECTED NOT DETECTED Final   Proteus species NOT DETECTED NOT DETECTED Final   Serratia marcescens NOT DETECTED NOT DETECTED Final   Haemophilus influenzae NOT DETECTED NOT DETECTED Final   Neisseria meningitidis NOT DETECTED NOT DETECTED Final   Pseudomonas aeruginosa NOT DETECTED NOT DETECTED Final   Candida albicans NOT DETECTED NOT DETECTED Final   Candida glabrata NOT DETECTED NOT DETECTED Final   Candida krusei NOT DETECTED NOT DETECTED Final   Candida parapsilosis NOT DETECTED NOT DETECTED Final   Candida tropicalis  NOT DETECTED NOT DETECTED Final  MRSA PCR Screening     Status: None   Collection Time: 01/07/17  5:21 PM  Result Value Ref Range Status   MRSA by PCR NEGATIVE NEGATIVE Final    Comment:        The GeneXpert MRSA Assay (FDA approved for NASAL specimens only), is one component of a comprehensive MRSA colonization surveillance program. It is not intended to diagnose MRSA infection nor to guide or monitor treatment for MRSA infections.     Studies/Results: Dg Chest 2 View  Result Date: 01/07/2017 CLINICAL DATA:  Street shortness of breath, follow-up from chest x-ray of Jan 02, 2017 at which time pulmonary edema or less likely pneumonia was suspected. EXAM: CHEST  2 VIEW COMPARISON:  PA and lateral chest x-ray of Jan 02, 2017 FINDINGS: The lungs are adequately inflated. There are increased  confluent alveolar opacities in the right mid and lower lung and in the left mid and lower lung. The interstitial markings are mildly increased. The cardiac silhouette remains enlarged. There is no significant left pleural effusion. There is a small right pleural effusion. There is calcification in the wall of the aortic arch. The power port catheter tip projects over the junction of the middle and distal thirds of the SVC. IMPRESSION: Worsening of airspace opacities compatible with pneumonia or asymmetric pulmonary edema though I favor the former. Mild cardiomegaly without pulmonary vascular congestion. Thoracic aortic atherosclerosis. Electronically Signed   By: David  Martinique M.D.   On: 01/07/2017 14:06   Dg Abd 1 View  Result Date: 01/08/2017 CLINICAL DATA:  Feeding tube placement. EXAM: ABDOMEN - 1 VIEW COMPARISON:  12/03/2013 FINDINGS: Enteric feeding tube tip projects in the stomach. There is residual contrast within the stomach, also within the small bowel and colon. Interstitial hazy airspace opacities are noted in the visualized lower lungs, with evidence of small pleural effusions. IMPRESSION: 1.  Enteric feeding tube tip projects in the mid stomach. Electronically Signed   By: Lajean Manes M.D.   On: 01/08/2017 15:47   Dg Chest Port 1 View  Result Date: 01/09/2017 CLINICAL DATA:  Respiratory failure EXAM: PORTABLE CHEST 1 VIEW COMPARISON:  01/08/2017 FINDINGS: Progression of bibasilar airspace disease, possible pneumonia. Small pleural effusions. Interval placement of feeding tube in the stomach. Port-A-Cath tip in the SVC unchanged. IMPRESSION: Progression of bibasilar infiltrates, possible pneumonia or edema. Feeding tube in the stomach. Electronically Signed   By: Franchot Gallo M.D.   On: 01/09/2017 08:19   Dg Chest Port 1 View  Result Date: 01/08/2017 CLINICAL DATA:  Respiratory failure.  Esophageal cancer. EXAM: PORTABLE CHEST 1 VIEW COMPARISON:  Yesterday FINDINGS: Bilateral central and basilar airspace disease is not significantly changed allowing for differences in patient position. Heart is upper normal in size. Right jugular Port-A-Cath is stable. No pneumothorax. IMPRESSION: Stable bilateral airspace disease in a pulmonary edema or ARDS pattern. Electronically Signed   By: Marybelle Killings M.D.   On: 01/08/2017 07:45   Dg Swallowing Func-speech Pathology  Result Date: 01/08/2017 Objective Swallowing Evaluation: Type of Study: MBS-Modified Barium Swallow Study Patient Details Name: Collin Young MRN: [id number] Date of Birth: 24-Aug-1951 Today's Date: 01/08/2017 Time: SLP Start Time (ACUTE ONLY): 1000-SLP Stop Time (ACUTE ONLY): 1030 SLP Time Calculation (min) (ACUTE ONLY): 30 min Past Medical History: Past Medical History: Diagnosis Date . Cancer [facility])   Esophagus- radiation 2011 . GERD (gastroesophageal reflux disease)   takes zantac prn . Headache  . History of radiation therapy 10/19/2013-12/05/2013  pyriform sinus 69.96 Gy/25fx . HOH (hard of hearing)  . Hyperlipemia  . Hypertension  . Seizures ([place])   alcohol withdrawls- 2001 Past Surgical History: Past Surgical History: Procedure  Laterality Date . CARDIOVASCULAR STRESS TEST  10/09/09  normal nuclearr stress test, EF 57% [name]) . LARYNGOSCOPY N/A 09/15/2013  Procedure: LARYNGOSCOPY;  Surgeon: [name], MD;  Location: [place] [facility];  Service: ENT;  Laterality: N/A;  direct laryngoscopy with biopsy and esophagoscopy . NO PAST SURGERIES   . 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 CXR significant for pulmonary edema. Treated with IV lasix. Cardiology was consulted and patient underwent right heart cath on 5/18 in which revealed multivessel CAD with plans to do PCI of RCA next week if renal function is stable. On 5/10 patient became tachypneic and hypoxic, given 40 mg IV lasix with 339ml output, with improvement. CXR shows worsening airspace opacities compatible with PNA or asymmetric pulmonary edema, mild cardiomegaly without vascular congestion. Pt was seen in OP SLP tx over 11 months, 4 visits. Pt reported that swallowing issues had resolved, but was  noncompliant with exercise program. Pt had G tube during RXT, it was removed in 2015.  In early 2016 SLP reported consistent throat clearing/cough observed with thin liquids, small sip chin tuck and effortful swallow x2 eliminated cough. No MBS or FEES on record.  No Data Recorded Assessment / Plan / Recommendation CHL IP CLINICAL IMPRESSIONS 01/08/2017 Clinical Impression Pt demonstrates severe to profound oropharyngeal dysphagia secondary to fibrotic musculature following RXT in 2015 without compliance to  recommendations for exercise program. Due to severe lack of mobility of hyolaryngeal complex and pharyngeal musculature there is minimal ability to protect airway during the swallow leading to immediate severe penetration to the cords before/during the swallow. Pt does appear to achieve moderate glottic closure to hold penetrates in the vestibule, but as soon as he releases the swallow, they spill into trachea. There is absent sensation from pt. In addition, due to lack of pharyngeal peristalsis and hyolaryngeal excursion, at least 50% of the bolus remains pooled in the vallecula and pyriforms post swallow, with increased severity with thick liquids. Attempted all positional compensations without success. Best strategy was a super supraglottic swallow to capitilaze on glottic closure and expectoration of penetrate. Even with this strategy, severe aspiration occurs before and after attempts. Pt verbalizes desire to continue all treatments to prolong life and would not want to accept risk of aspiration. Given dx of pna and need for further medical interventions, aggressive prevention of aspiration of PO is recommended. Suggest short term alternate means of nutrition until cardiac procedure is complete and pts respiratory status is somewhat improved.  Recommend ice chips after oral care only at this time.  Will plan to retest next week, but expect poor spontaneous recovery or potential to improve with therapy over the short term given length of time since RXT and severity of impairment.  SLP Visit Diagnosis Dysphagia, oropharyngeal phase (R13.12) Attention and concentration deficit following -- Frontal lobe and executive function deficit following -- Impact on safety and function Severe aspiration risk;Risk for inadequate nutrition/hydration   CHL IP TREATMENT RECOMMENDATION 01/08/2017 Treatment Recommendations Therapy as outlined in treatment plan below   Prognosis 01/08/2017 Prognosis for Safe Diet Advancement Guarded  Barriers to Reach Goals Severity of deficits;Time post onset Barriers/Prognosis Comment -- CHL IP DIET RECOMMENDATION 01/08/2017 SLP Diet Recommendations NPO;Alternative means - temporary;Ice chips PRN after oral care Liquid Administration via -- Medication Administration Via alternative means Compensations -- Postural Changes --   CHL IP OTHER RECOMMENDATIONS 01/08/2017 Recommended Consults -- Oral Care Recommendations Oral care before and after PO Other Recommendations --   CHL IP FOLLOW UP RECOMMENDATIONS 01/08/2017 Follow up Recommendations Skilled Nursing facility   [facility] IP FREQUENCY AND DURATION 01/08/2017 Speech Therapy Frequency (ACUTE ONLY) min 2x/week Treatment Duration 2 weeks      CHL IP ORAL PHASE 01/08/2017 Oral Phase Impaired Oral -  Pudding Teaspoon -- Oral - Pudding Cup -- Oral - Honey Teaspoon -- Oral - Honey Cup -- Oral - Nectar Teaspoon -- Oral - Nectar Cup -- Oral - Nectar Straw -- Oral - Thin Teaspoon -- Oral - Thin Cup -- Oral - Thin Straw -- Oral - Puree -- Oral - Mech Soft -- Oral - Regular -- Oral - Multi-Consistency -- Oral - Pill -- Oral Phase - Comment --  CHL IP PHARYNGEAL PHASE 01/08/2017 Pharyngeal Phase Impaired Pharyngeal- Pudding Teaspoon -- Pharyngeal -- Pharyngeal- Pudding Cup -- Pharyngeal -- Pharyngeal- Honey Teaspoon Delayed swallow initiation-pyriform sinuses;Reduced pharyngeal peristalsis;Reduced epiglottic inversion;Reduced anterior laryngeal mobility;Reduced laryngeal elevation;Reduced airway/laryngeal closure;Reduced tongue base retraction;Penetration/Aspiration before swallow;Penetration/Aspiration during swallow;Penetration/Apiration after swallow;Significant aspiration (Amount);Pharyngeal residue - valleculae;Pharyngeal residue - pyriform Pharyngeal -- Pharyngeal- Honey Cup -- Pharyngeal -- Pharyngeal- Nectar Teaspoon -- Pharyngeal -- Pharyngeal- Nectar Cup Delayed swallow initiation-pyriform sinuses;Reduced pharyngeal peristalsis;Reduced epiglottic inversion;Reduced anterior  laryngeal mobility;Reduced laryngeal elevation;Reduced airway/laryngeal closure;Reduced tongue base retraction;Penetration/Aspiration before swallow;Penetration/Aspiration during swallow;Penetration/Apiration after swallow;Significant aspiration (Amount);Pharyngeal residue - valleculae;Pharyngeal residue - pyriform Pharyngeal -- Pharyngeal- Nectar Straw -- Pharyngeal -- Pharyngeal- Thin Teaspoon -- Pharyngeal -- Pharyngeal- Thin Cup Delayed swallow initiation-pyriform sinuses;Reduced pharyngeal peristalsis;Reduced epiglottic inversion;Reduced anterior laryngeal mobility;Reduced laryngeal elevation;Reduced airway/laryngeal closure;Reduced tongue base retraction;Penetration/Aspiration before swallow;Penetration/Aspiration during swallow;Penetration/Apiration after swallow;Significant aspiration (Amount);Pharyngeal residue - valleculae;Pharyngeal residue - pyriform Pharyngeal -- Pharyngeal- Thin Straw -- Pharyngeal -- Pharyngeal- Puree Reduced pharyngeal peristalsis;Reduced epiglottic inversion;Reduced anterior laryngeal mobility;Reduced laryngeal elevation;Reduced airway/laryngeal closure;Reduced tongue base retraction;Penetration/Apiration after swallow;Significant aspiration (Amount);Pharyngeal residue - valleculae;Pharyngeal residue - pyriform;Delayed swallow initiation-vallecula Pharyngeal -- Pharyngeal- Mechanical Soft -- Pharyngeal -- Pharyngeal- Regular -- Pharyngeal -- Pharyngeal- Multi-consistency -- Pharyngeal -- Pharyngeal- Pill -- Pharyngeal -- Pharyngeal Comment --  CHL IP CERVICAL ESOPHAGEAL PHASE 01/08/2017 Cervical Esophageal Phase Impaired Pudding Teaspoon -- Pudding Cup -- Honey Teaspoon -- Honey Cup -- Nectar Teaspoon -- Nectar Cup -- Nectar Straw -- Thin Teaspoon -- Thin Cup -- Thin Straw -- Puree -- Mechanical Soft -- Regular -- Multi-consistency -- Pill -- Cervical Esophageal Comment reduced CP opening No flowsheet data found. Herbie [place], [place] CCC-SLP [phone] Lynann Beaver 01/08/2017,  10:54 AM                Assessment/Plan: HCAP Will narrow anbx to cefepime alone, aim for 7 days of cefepime. Then change to ancef for total of 14 days.  Appears to be doing ok. WBC normal, afebrile, )2 requirements stable at 4L  Thrush Day 2/7 fluconazole  MSSA bacteremia Port Will drop vanco Repeat BCx for clearance sent this AM Again, not sure he is a good candidate for aggressive procedures (TEE and removal of port)  SCCa of esophagus  CHF  EF 10-15% CAD    Total days of antibiotics: 2 vanco/cefepime --> cefepime         Bobby Rumpf Infectious Diseases (pager) [phone] [email or web address] 01/09/2017, 11:04 AM  LOS: 7 days

## 2017-01-09 NOTE — Progress Notes (Signed)
Appointment scheduled with daughter's tomorrow, 01/10/17, morning at 0800 for [place]: PEG, cardiac interventions, code status and other issues relevant to pt and family. Romona Curls, ANP

## 2017-01-09 NOTE — Progress Notes (Signed)
Subjective:  Since yesterday he had a feeding tube placed and is currently receiving feedings.  On milrinone.  Has a soft voice but does not complain of shortness of breath.  Objective:  Vital Signs in the last 24 hours: BP 114/73 (BP Location: Right Arm)   Pulse 80   Temp 98.4 F (36.9 C) (Oral)   Resp 19   Ht 5\' 6"  (1.676 m)   Wt 55.2 kg (121 lb 11.2 oz)   SpO2 100%   BMI 19.64 kg/m   Physical Exam: Thin black male in no acute distress, feeding tube in place Lungs:  Rales halfway up on the right side and reduced breath sounds mild rales in the left  Cardiac:  Regular rhythm, normal S1 and S2, no S3 Abdomen:  Soft, nontender, no masses Extremities:  No edema present  Intake/Output from previous day: 05/11 0701 - 05/12 0700 In: 1149.6 [I.V.:116.6; NG/GT:755; IV Piggyback:200] Out: 1075 [Urine:1075] Weight Filed Weights   01/07/17 0616 01/08/17 0500 01/09/17 0500  Weight: 54.7 kg (120 lb 11.2 oz) 55.2 kg (121 lb 9.6 oz) 55.2 kg (121 lb 11.2 oz)    Lab Results: Basic Metabolic Panel:  Recent Labs  01/08/17 0410 01/09/17 0451  NA 125* 128*  K 4.4 3.9  CL 86* 88*  CO2 27 32  GLUCOSE 91 122*  BUN 66* 47*  CREATININE 2.02* 1.33*    CBC:  Recent Labs  01/08/17 0410  WBC 8.1  HGB 11.5*  HCT 34.8*  MCV 84.7  PLT 144*    BNP    Component Value Date/Time   BNP 2,182.6 (H) 01/05/2017 0263   Telemetry: Sinus rhythm with occasional PACs, personally reviewed  Assessment/Plan:  1.  Acute systolic congestive heart failure with ischemic cardiomyopathy 2.  Acute renal insufficiency improved somewhat 3.  Coronary artery disease with severe calcific disease in the proximal to mid RCA and moderate left main disease 4.  Pulmonary edema 5.  Dysphasia currently requiring feedings 6.  History of cancer of the esophagus  Recommendations:  His weight is essentially unchanged.  Coox has improved to 57 but is still low.  Diuresing some.  He is now requiring feedings  because of dysphagia.  We'll continue milrinone and continue to check coox as well as intravenous furosemide.  Continue to follow creatinine.  Note plan in chart for staged PCI but was feeding tube and other issues may need to defer until and somewhat better condition.  This will be high risk.    Kerry Hough  MD [facility]  01/09/2017, 10:52 AM

## 2017-01-09 NOTE — Plan of Care (Signed)
Problem: Nutrition: Goal: Adequate nutrition will be maintained Outcome: Progressing Continuous tube feed started today. Pt tolerating well.

## 2017-01-09 NOTE — Progress Notes (Signed)
  Speech Language Pathology Treatment: Dysphagia  Patient Details Name: Collin Young MRN: [id number] DOB: 11-06-1950 Today's Date: 01/09/2017 Time: 4315-4008 SLP Time Calculation (min) (ACUTE ONLY): 20 min  Assessment / Plan / Recommendation Clinical Impression  Patient seen for dysphagia treatment targeting training in effortful swallow as well as exercises for hyolaryngeal excursion. Provided education regarding strict oral care prior to consumption of ice chips. Pt performed oral care with setup by SLP. Trained pt in Shaker exercise; he completed 30-45 second holds and 20 repetitions with 1 sec hold with min verbal cues and demonstration. Provided training in chin-tuck against resistance as an alternative while tube feeds are running. Pt instructed to complete reps up to 60 as tolerated. Ice chips with effortful swallow x1, limited by pt's throat pain when swallowing. SLP will f/u for continued training in above techniques, repeat instrumental as appropriate.    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 CXR significant for pulmonary edema. Treated with IV lasix. Cardiology was consulted and patient underwent right heart cath on 5/18 in which revealed multivessel CAD with plans to do PCI of RCA next week if renal function is stable. On 5/10 patient became tachypneic and hypoxic, given 40 mg IV lasix with 378ml output, with improvement. CXR shows worsening airspace opacities compatible with PNA or asymmetric pulmonary edema, mild cardiomegaly without vascular congestion. Pt was seen in OP SLP tx over 11 months, 4 visits. Pt reported that swallowing issues had resolved, but was  noncompliant with exercise program. Pt had G tube during RXT, it was  removed in 2015.  In early 2016 SLP reported consistent throat clearing/cough observed with thin liquids, small sip chin tuck and effortful swallow x2 eliminated cough. No MBS or FEES on record.       SLP Plan  Continue with current plan of care       Recommendations  Diet recommendations: NPO;Other(comment) (ice chips within 30 minutes of oral care) Liquids provided via: Teaspoon Medication Administration: Via alternative means Supervision: Patient able to self feed Compensations: Effortful swallow                Oral Care Recommendations: Oral care QID Follow up Recommendations: Skilled Nursing facility SLP Visit Diagnosis: Dysphagia, oropharyngeal phase (R13.12) Plan: Continue with current plan of care       [place], [place], [place] Pathologist [phone]  Aliene Altes 01/09/2017, 10:07 AM

## 2017-01-09 NOTE — Progress Notes (Signed)
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 He presented to the emergency department complaining of progressive shortness of breath for the past several weeks. He was admitted with concerns of CHF, with BNP significantly elevated at 2000. Echocardiogram revealed ejection fraction of 10-15% patient was started on beta blockers Lasix and Aldactone. Patient also on admission was found to be on acute kidney injury. Cardiology consulted, underwent cardiac cath showing significant multivessel disease. Recommended staged PCI when creatinine improves. Dysphagia was evaluated with recommendation per SLP for total NPO status, NG tube and reevaluation once clinical status improved.  Subjective: Tube is irritating throat.   Assessment & Plan: Acute respiratory failure with hypoxia: Due to ischemic cardiomyopathy/acute systolic CHF, pulmonary edema, and likely aspiration pneumonitis/pneumonia. CXR show worsening air space disease, pneumonia vs pulm edema, he is afebrile and no leukocytosis. abg ordered.  - Continue oxygen via nasal cannula as needed. Continue SDU level of care for now. - Continue nebs per pulmonology - Continue antibiotics as below - Repeat CXR in AM  Acute systolic heart failure: New diagnosis due to multivessel CAD.  - Continuing lasix 20mg  IV BID, NTG gtt for pulmonary edema - Continue coreg 6.25 mg titrated as tolerated  - ACE and aldactone on hold due to AKI   - Staged PCI planned per cardiology once clinical status improves. - Daily weight, I&O's low salt diet  - Co-ox improving but still low, milrinone per cardiology.   AKI: suspect cardiorenal syndrome vs from  contrast vs diuretics. Poor CO leading to poor kidney perfusion. FENa 0.64% suggestive of prerenal etiology. - Monitor, improving with diuresis.   - Renal US show complex left cystic mass, warrants additional surveillance with MRI or CT with contrast will defer after patient stable given patient will receive multiple contrast doses for cardiac cath. This can be follow as outpatient as well.   MSSA bacteremia: in 1 of 2 blood cultures from 5/10. PCT elevated > 10. - ID recommending cefepime x7 days for aspiration, convert to ancef for total 14 days - No recommendations for TEE or pulling port at this time due to poor health status.  - Repeat blood cultures 5/12  Enlarged prostate: Seen on U/S. No LUTS/BOO. PSA normal.  - No medications given lack of symptoms.   Hypertension: Per patient was taken off his BP meds by PCP PTA  - Continue coreg and NTG gtt as tolerated   Severe aspiration: Related to h/o radiation to esophagus, MBSS 5/11 demonstrated frank aspiration of all consistencies.  - NPO - Cortrak placed, nutrition consulted.  - Reevaluate by SLP next week and consider PEG (has had this placed in the past) if no improvement, pending GOC discussions. - Covering for respiratory anaerobes as above.   Squamous cell CA of the left piriform sinus and esophagus: In remission  - Continuing home fentanyl patch.   Hyponatremia: Suspect hypervolemia, improving. - Monitor  DVT prophylaxis: Lovenox  Code Status: Full Family Communication: None at bedside this AM. Family meeting planned 5/13 at 8am Disposition Plan: Continues milrinone and NTG gtt's, continue SDU level of care.    Consultants:   Cardiology   PCCM  ID   Palliative care  Procedures:   ECHO 01/03/17 -  Left ventricle: The cavity size was normal. Wall thickness was   normal. The estimated ejection fraction was in the range of 10%   to 15%. Diffuse hypokinesis. DIastolic function is abnormal,   indeterminant grade. -  Aortic valve: There was moderate regurgitation. Valve area (VTI):   1.08 cm^2. Valve area (Vmax): 1.5 cm^2. Valve area (Vmean): 1.5   cm^2. - Mitral valve: There was moderate regurgitation. - Left atrium: The atrium was mildly dilated. - Right ventricle: Systolic function was mildly to moderately   reduced. - Tricuspid valve: There was moderate regurgitation. - Pulmonary arteries: Systolic pressure was moderately increased.   PA peak pressure: 49 mm Hg (S).  Right/Left Heart Cath and Coronary Angiography 01/05/2017     Prox RCA to Mid RCA lesion, 99 %stenosed.  Mid RCA lesion, 30 %stenosed.  Dist RCA lesion, 40 %stenosed.  RPDA lesion, 20 %stenosed.  Prox Cx lesion, 100 %stenosed.  Ost Ramus to Ramus lesion, 50 %stenosed.  Ost 3rd Diag to 3rd Diag lesion, 30 %stenosed.  Mid LAD lesion, 40 %stenosed.  Ost LAD to Prox LAD lesion, 20 %stenosed.  Ost LM to LM lesion, 40 %stenosed.  Hemodynamic findings consistent with mild pulmonary hypertension.  1. Severe calcific disease in the proximal to mid RCA 2. 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 Moderate left main and mid LAD stenosis 4. Elevated filling pressures  Recommendations: He has multivessel CAD. I think the RCA can be approached with PCI but will likely require atherectomy prior to stenting. I would treat the remainder of his CAD medically. Will stage PCI given renal insufficiency. Hydrate gently tonight. May need diuresis tomorrow given elevated filling pressures. Would anticipate PCI on Thursday or Friday of this week if renal function remains stable.    Antimicrobials: Vanc/cefepime > cefepime 5/12.   Objective: Vitals:   01/09/17 0817 01/09/17 0905 01/09/17 1347 01/09/17 1626  BP: 114/73  128/77 124/69  Pulse: 80  79 83  Resp:   18 19  Temp: 98.4 F (36.9 C)   99.6 F (37.6 C)  TempSrc: Oral   Oral  SpO2: 98% 100% 99% 100%  Weight:      Height:        Intake/Output Summary (Last 24  hours) at 01/09/17 1708 Last data filed at 01/09/17 0900  Gross per 24 hour  Intake          1149.59 ml  Output             1000 ml  Net           149.59 ml   Filed Weights   01/07/17 0616 01/08/17 0500 01/09/17 0500  Weight: 54.7 kg (120 lb 11.2 oz) 55.2 kg (121 lb 9.6 oz) 55.2 kg (121 lb 11.2 oz)    Examination: General exam: Frail, chronically ill-appearing male in no distress. Hoarse voice, cortrak tube in place. Respiratory system: Nonlabored, coarse throughout with bilateral crackles (R > L) Cardiovascular system: Regular, no murmur, no JVD   Gastrointestinal system: Abd Soft NTND, +BS Extremities: No LE edema  Skin: No lesions or rash. Right upper chest port site c/d/i  Data Reviewed: I have personally reviewed following labs and imaging studies  CBC:  Recent Labs Lab 01/06/17 0423 01/08/17 0410  WBC 8.0 8.1  HGB 13.8 11.5*  HCT 42.0 34.8*  MCV 86.1 84.7  PLT 189 413*   Basic Metabolic Panel:  Recent Labs Lab 01/05/17 0625 01/06/17 0423 01/07/17 0214 01/07/17 2035 01/08/17 0410 01/09/17 0451  NA 131* 132* 127*  --  125* 128*  K 4.6 5.1 5.1  --  4.4 3.9  CL 89* 90* 89*  --  86* 88*  CO2 31 29 19*  --  27 32  GLUCOSE 119* 119* 123*  --  91 122*  BUN 46* 42* 52*  --  66* 47*  CREATININE 1.72* 1.72* 2.01*  --  2.02* 1.33*  CALCIUM 8.9 8.9 8.5*  --  8.3* 8.0*  MG  --   --   --  2.5* 2.7*  --   PHOS  --   --   --  2.8 3.5  --    GFR: Estimated Creatinine Clearance: 42.7 mL/min (A) (by C-G formula based on SCr of 1.33 mg/dL (H)). Liver Function Tests: No results for input(s): AST, ALT, ALKPHOS, BILITOT, PROT, ALBUMIN in the last 168 hours. Recent Results (from the past 240 hour(s))  Culture, blood (Routine X 2) w Reflex to ID Panel     Status: None (Preliminary result)   Collection Time: 01/07/17  3:43 PM  Result Value Ref Range Status   Specimen Description BLOOD RIGHT ANTECUBITAL  Final   Special Requests IN PEDIATRIC BOTTLE Blood Culture adequate  volume  Final   Culture NO GROWTH 2 DAYS  Final   Report Status PENDING  Incomplete  Culture, blood (Routine X 2) w Reflex to ID Panel     Status: Abnormal (Preliminary result)   Collection Time: 01/07/17  3:47 PM  Result Value Ref Range Status   Specimen Description BLOOD RIGHT HAND  Final   Special Requests IN PEDIATRIC BOTTLE Blood Culture adequate volume  Final   Culture  Setup Time   Final    GRAM POSITIVE COCCI IN CLUSTERS IN PEDIATRIC BOTTLE CRITICAL RESULT CALLED TO, READ BACK BY AND VERIFIED WITH: Leonie Green Pharm.D. 13:30 01/08/17 ([email or web address])    Culture (A)  Final    STAPHYLOCOCCUS AUREUS SUSCEPTIBILITIES TO FOLLOW    Report Status PENDING  Incomplete  Blood Culture ID Panel (Reflexed)     Status: Abnormal   Collection Time: 01/07/17  3:47 PM  Result Value Ref Range Status   Enterococcus species NOT DETECTED NOT DETECTED Final   Listeria monocytogenes NOT DETECTED NOT DETECTED Final   Staphylococcus species DETECTED (A) NOT DETECTED Final    Comment: CRITICAL RESULT CALLED TO, READ BACK BY AND VERIFIED WITH: M. Vonita Moss.D. 13:30 01/08/17 ([email or web address])    Staphylococcus aureus DETECTED (A) NOT DETECTED Final    Comment: Methicillin (oxacillin) susceptible Staphylococcus aureus (MSSA). Preferred therapy is anti staphylococcal beta lactam antibiotic (Cefazolin or Nafcillin), unless clinically contraindicated. CRITICAL RESULT CALLED TO, READ BACK BY AND VERIFIED WITH: M. Radford Pax Pharm.D. 13:30 01/08/17 ([email or web address])    Methicillin resistance NOT DETECTED NOT DETECTED Final   Streptococcus species NOT DETECTED NOT DETECTED Final   Streptococcus agalactiae NOT DETECTED NOT DETECTED Final   Streptococcus pneumoniae NOT DETECTED NOT DETECTED Final   Streptococcus pyogenes NOT DETECTED NOT DETECTED Final   Acinetobacter baumannii NOT DETECTED NOT DETECTED Final   Enterobacteriaceae species NOT DETECTED NOT DETECTED Final   Enterobacter cloacae complex NOT DETECTED NOT DETECTED Final    Escherichia coli NOT DETECTED NOT DETECTED Final   Klebsiella oxytoca NOT DETECTED NOT DETECTED Final   Klebsiella pneumoniae NOT DETECTED NOT DETECTED Final   Proteus species NOT DETECTED NOT DETECTED Final   Serratia marcescens NOT DETECTED NOT DETECTED Final   Haemophilus influenzae NOT DETECTED NOT DETECTED Final   Neisseria meningitidis NOT DETECTED NOT  DETECTED Final   Pseudomonas aeruginosa NOT DETECTED NOT DETECTED Final   Candida albicans NOT DETECTED NOT DETECTED Final   Candida glabrata NOT DETECTED NOT DETECTED Final   Candida krusei NOT DETECTED NOT DETECTED Final   Candida parapsilosis NOT DETECTED NOT DETECTED Final   Candida tropicalis NOT DETECTED NOT DETECTED Final  MRSA PCR Screening     Status: None   Collection Time: 01/07/17  5:21 PM  Result Value Ref Range Status   MRSA by PCR NEGATIVE NEGATIVE Final    Comment:        The GeneXpert MRSA Assay (FDA approved for NASAL specimens only), is one component of a comprehensive MRSA colonization surveillance program. It is not intended to diagnose MRSA infection nor to guide or monitor treatment for MRSA infections.       Radiology Studies: Dg Abd 1 View  Result Date: 01/08/2017 CLINICAL DATA:  Feeding tube placement. EXAM: ABDOMEN - 1 VIEW COMPARISON:  12/03/2013 FINDINGS: Enteric feeding tube tip projects in the stomach. There is residual contrast within the stomach, also within the small bowel and colon. Interstitial hazy airspace opacities are noted in the visualized lower lungs, with evidence of small pleural effusions. IMPRESSION: 1. Enteric feeding tube tip projects in the mid stomach. Electronically Signed   By: Lajean Manes M.D.   On: 01/08/2017 15:47   Dg Chest Port 1 View  Result Date: 01/09/2017 CLINICAL DATA:  Respiratory failure EXAM: PORTABLE CHEST 1 VIEW COMPARISON:  01/08/2017 FINDINGS: Progression of bibasilar airspace disease, possible pneumonia. Small pleural effusions. Interval placement of  feeding tube in the stomach. Port-A-Cath tip in the SVC unchanged. IMPRESSION: Progression of bibasilar infiltrates, possible pneumonia or edema. Feeding tube in the stomach. Electronically Signed   By: Franchot Gallo M.D.   On: 01/09/2017 08:19   Dg Chest Port 1 View  Result Date: 01/08/2017 CLINICAL DATA:  Respiratory failure.  Esophageal cancer. EXAM: PORTABLE CHEST 1 VIEW COMPARISON:  Yesterday FINDINGS: Bilateral central and basilar airspace disease is not significantly changed allowing for differences in patient position. Heart is upper normal in size. Right jugular Port-A-Cath is stable. No pneumothorax. IMPRESSION: Stable bilateral airspace disease in a pulmonary edema or ARDS pattern. Electronically Signed   By: Marybelle Killings M.D.   On: 01/08/2017 07:45   Dg Swallowing Func-speech Pathology  Result Date: 01/08/2017 Objective Swallowing Evaluation: Type of Study: MBS-Modified Barium Swallow Study Patient Details Name: Collin Young MRN: [id number] Date of Birth: 03-08-1951 Today's Date: 01/08/2017 Time: SLP Start Time (ACUTE ONLY): 1000-SLP Stop Time (ACUTE ONLY): 1030 SLP Time Calculation (min) (ACUTE ONLY): 30 min Past Medical History: Past Medical History: Diagnosis Date . Cancer [facility])   Esophagus- radiation 2011 . GERD (gastroesophageal reflux disease)   takes zantac prn . Headache  . History of radiation therapy 10/19/2013-12/05/2013  pyriform sinus 69.96 Gy/68fx . HOH (hard of hearing)  . Hyperlipemia  . Hypertension  . Seizures ([place])   alcohol withdrawls- 2001 Past Surgical History: Past Surgical History: Procedure Laterality Date . CARDIOVASCULAR STRESS TEST  10/09/09  normal nuclearr stress test, EF 57% [name]) . LARYNGOSCOPY N/A 09/15/2013  Procedure: LARYNGOSCOPY;  Surgeon: [name], MD;  Location: [place];  Service: ENT;  Laterality: N/A;  direct laryngoscopy with biopsy and esophagoscopy . NO PAST SURGERIES   . RIGHT/LEFT HEART CATH AND CORONARY ANGIOGRAPHY N/A 01/05/2017  Procedure:  Right/Left Heart Cath and Coronary Angiography;  Surgeon: [name], MD;  Location: [facility];  Service: Cardiovascular;  Laterality: N/A; HPI: 66 year old male with PMH of GERD, squamous cell carcinoma of the left pyriform and squamous cell carcinoma of the esophagus s/p chemotherapy and radiation (2015) currently in remission, systolic CHF, and stage 3 CKD Presents to ED on 5/5 with progressive dyspnea for the last several weeks. CXR significant for pulmonary edema. Treated with IV lasix. Cardiology was consulted and patient underwent right heart cath on 5/18 in which revealed multivessel CAD with plans to do PCI of RCA next week if renal function is stable. On 5/10 patient became tachypneic and hypoxic, given 40 mg IV lasix with 35ml output, with improvement. CXR shows worsening airspace opacities compatible with PNA or asymmetric pulmonary edema, mild cardiomegaly without vascular congestion. Pt was seen in OP SLP tx over 11 months, 4 visits. Pt reported that swallowing issues had resolved, but was  noncompliant with exercise program. Pt had G tube during RXT, it was removed in 2015.  In early 2016 SLP reported consistent throat clearing/cough observed with thin liquids, small sip chin tuck and effortful swallow x2 eliminated cough. No MBS or FEES on record.  No Data Recorded Assessment / Plan / Recommendation CHL IP CLINICAL IMPRESSIONS 01/08/2017 Clinical Impression Pt demonstrates severe to profound oropharyngeal dysphagia secondary to fibrotic musculature following RXT in 2015 without compliance to recommendations for exercise program. Due to severe lack of mobility of hyolaryngeal complex and pharyngeal musculature there is minimal ability to protect airway during the swallow leading to immediate severe penetration to the cords before/during the swallow. Pt does appear to achieve moderate glottic closure to hold penetrates in the vestibule, but as soon as he releases the swallow, they  spill into trachea. There is absent sensation from pt. In addition, due to lack of pharyngeal peristalsis and hyolaryngeal excursion, at least 50% of the bolus remains pooled in the vallecula and pyriforms post swallow, with increased severity with thick liquids. Attempted all positional compensations without success. Best strategy was a super supraglottic swallow to capitilaze on glottic closure and expectoration of penetrate. Even with this strategy, severe aspiration occurs before and after attempts. Pt verbalizes desire to continue all treatments to prolong life and would not want to accept risk of aspiration. Given dx of pna and need for further medical interventions, aggressive prevention of aspiration of PO is recommended. Suggest short term alternate means of nutrition until cardiac procedure is complete and pts respiratory status is somewhat improved.  Recommend ice chips after oral care only at this time.  Will plan to retest next week, but expect poor spontaneous recovery or potential to improve with therapy over the short term given length of time since RXT and severity of impairment.  SLP Visit Diagnosis Dysphagia, oropharyngeal phase (R13.12) Attention and concentration deficit following -- Frontal lobe and executive function deficit following -- Impact on safety and function Severe aspiration risk;Risk for inadequate nutrition/hydration   CHL IP TREATMENT RECOMMENDATION 01/08/2017 Treatment Recommendations Therapy as outlined in treatment plan below   Prognosis 01/08/2017 Prognosis for Safe Diet Advancement Guarded Barriers to Reach Goals Severity of deficits;Time post onset Barriers/Prognosis Comment -- CHL IP DIET RECOMMENDATION 01/08/2017 SLP Diet Recommendations NPO;Alternative means - temporary;Ice chips PRN after oral care Liquid Administration via -- Medication Administration Via alternative means Compensations -- Postural Changes --   CHL IP OTHER RECOMMENDATIONS 01/08/2017 Recommended Consults --  Oral Care Recommendations Oral care before and after PO Other Recommendations --   CHL IP FOLLOW UP RECOMMENDATIONS 01/08/2017 Follow up Recommendations Skilled Nursing facility  CHL IP FREQUENCY AND DURATION 01/08/2017 Speech Therapy Frequency (ACUTE ONLY) min 2x/week Treatment Duration 2 weeks      CHL IP ORAL PHASE 01/08/2017 Oral Phase Impaired Oral - Pudding Teaspoon -- Oral - Pudding Cup -- Oral - Honey Teaspoon -- Oral - Honey Cup -- Oral - Nectar Teaspoon -- Oral - Nectar Cup -- Oral - Nectar Straw -- Oral - Thin Teaspoon -- Oral - Thin Cup -- Oral - Thin Straw -- Oral - Puree -- Oral - Mech Soft -- Oral - Regular -- Oral - Multi-Consistency -- Oral - Pill -- Oral Phase - Comment --  CHL IP PHARYNGEAL PHASE 01/08/2017 Pharyngeal Phase Impaired Pharyngeal- Pudding Teaspoon -- Pharyngeal -- Pharyngeal- Pudding Cup -- Pharyngeal -- Pharyngeal- Honey Teaspoon Delayed swallow initiation-pyriform sinuses;Reduced pharyngeal peristalsis;Reduced epiglottic inversion;Reduced anterior laryngeal mobility;Reduced laryngeal elevation;Reduced airway/laryngeal closure;Reduced tongue base retraction;Penetration/Aspiration before swallow;Penetration/Aspiration during swallow;Penetration/Apiration after swallow;Significant aspiration (Amount);Pharyngeal residue - valleculae;Pharyngeal residue - pyriform Pharyngeal -- Pharyngeal- Honey Cup -- Pharyngeal -- Pharyngeal- Nectar Teaspoon -- Pharyngeal -- Pharyngeal- Nectar Cup Delayed swallow initiation-pyriform sinuses;Reduced pharyngeal peristalsis;Reduced epiglottic inversion;Reduced anterior laryngeal mobility;Reduced laryngeal elevation;Reduced airway/laryngeal closure;Reduced tongue base retraction;Penetration/Aspiration before swallow;Penetration/Aspiration during swallow;Penetration/Apiration after swallow;Significant aspiration (Amount);Pharyngeal residue - valleculae;Pharyngeal residue - pyriform Pharyngeal -- Pharyngeal- Nectar Straw -- Pharyngeal -- Pharyngeal- Thin  Teaspoon -- Pharyngeal -- Pharyngeal- Thin Cup Delayed swallow initiation-pyriform sinuses;Reduced pharyngeal peristalsis;Reduced epiglottic inversion;Reduced anterior laryngeal mobility;Reduced laryngeal elevation;Reduced airway/laryngeal closure;Reduced tongue base retraction;Penetration/Aspiration before swallow;Penetration/Aspiration during swallow;Penetration/Apiration after swallow;Significant aspiration (Amount);Pharyngeal residue - valleculae;Pharyngeal residue - pyriform Pharyngeal -- Pharyngeal- Thin Straw -- Pharyngeal -- Pharyngeal- Puree Reduced pharyngeal peristalsis;Reduced epiglottic inversion;Reduced anterior laryngeal mobility;Reduced laryngeal elevation;Reduced airway/laryngeal closure;Reduced tongue base retraction;Penetration/Apiration after swallow;Significant aspiration (Amount);Pharyngeal residue - valleculae;Pharyngeal residue - pyriform;Delayed swallow initiation-vallecula Pharyngeal -- Pharyngeal- Mechanical Soft -- Pharyngeal -- Pharyngeal- Regular -- Pharyngeal -- Pharyngeal- Multi-consistency -- Pharyngeal -- Pharyngeal- Pill -- Pharyngeal -- Pharyngeal Comment --  CHL IP CERVICAL ESOPHAGEAL PHASE 01/08/2017 Cervical Esophageal Phase Impaired Pudding Teaspoon -- Pudding Cup -- Honey Teaspoon -- Honey Cup -- Nectar Teaspoon -- Nectar Cup -- Nectar Straw -- Thin Teaspoon -- Thin Cup -- Thin Straw -- Puree -- Mechanical Soft -- Regular -- Multi-consistency -- Pill -- Cervical Esophageal Comment reduced CP opening No flowsheet data found. Herbie [place], [place] CCC-SLP [phone] Lynann Beaver 01/08/2017, 10:54 AM              Scheduled Meds: . atorvastatin  20 mg Oral Daily  . budesonide (PULMICORT) nebulizer solution  0.5 mg Nebulization BID  . carvedilol  6.25 mg Oral BID WC  . multivitamin with minerals  1 tablet Oral Daily  . sodium chloride flush  3 mL Intravenous Q12H   Continuous Infusions: . sodium chloride    . ceFEPime (MAXIPIME) IV 1 g (01/09/17 1401)  . feeding  supplement (JEVITY 1.2 CAL) 1,000 mL (01/09/17 1407)  . fluconazole (DIFLUCAN) IV Stopped (01/08/17 2200)  . milrinone 0.25 mcg/kg/min (01/09/17 0952)  . nitroGLYCERIN 5 mcg/min (01/07/17 1822)     LOS: 7 days   Vance Gather, MD Pager: Text Page via [email or web address]  [phone]   If 7PM-7AM, please contact night-coverage [email or web address] Password [facility] 01/09/2017, 5:08 PM

## 2017-01-10 ENCOUNTER — Inpatient Hospital Stay (HOSPITAL_COMMUNITY): Payer: Medicare Other

## 2017-01-10 DIAGNOSIS — R1310 Dysphagia, unspecified: Secondary | ICD-10-CM

## 2017-01-10 LAB — GLUCOSE, CAPILLARY
GLUCOSE-CAPILLARY: 121 mg/dL — AB (ref 65–99)
GLUCOSE-CAPILLARY: 132 mg/dL — AB (ref 65–99)
GLUCOSE-CAPILLARY: 144 mg/dL — AB (ref 65–99)
Glucose-Capillary: 143 mg/dL — ABNORMAL HIGH (ref 65–99)
Glucose-Capillary: 138 mg/dL — ABNORMAL HIGH (ref 65–99)
Glucose-Capillary: 145 mg/dL — ABNORMAL HIGH (ref 65–99)

## 2017-01-10 LAB — BLOOD CULTURE ID PANEL (REFLEXED)
Acinetobacter baumannii: NOT DETECTED
CANDIDA ALBICANS: NOT DETECTED
CANDIDA TROPICALIS: NOT DETECTED
Candida glabrata: NOT DETECTED
Candida krusei: NOT DETECTED
Candida parapsilosis: NOT DETECTED
ENTEROBACTERIACEAE SPECIES: NOT DETECTED
Enterobacter cloacae complex: NOT DETECTED
Enterococcus species: NOT DETECTED
Escherichia coli: NOT DETECTED
HAEMOPHILUS INFLUENZAE: NOT DETECTED
Klebsiella oxytoca: NOT DETECTED
Klebsiella pneumoniae: NOT DETECTED
Listeria monocytogenes: NOT DETECTED
METHICILLIN RESISTANCE: NOT DETECTED
Neisseria meningitidis: NOT DETECTED
PROTEUS SPECIES: NOT DETECTED
Pseudomonas aeruginosa: NOT DETECTED
STAPHYLOCOCCUS SPECIES: DETECTED — AB
Serratia marcescens: NOT DETECTED
Staphylococcus aureus (BCID): DETECTED — AB
Streptococcus agalactiae: NOT DETECTED
Streptococcus pneumoniae: NOT DETECTED
Streptococcus pyogenes: NOT DETECTED
Streptococcus species: NOT DETECTED

## 2017-01-10 LAB — CBC
HCT: 35.2 % — ABNORMAL LOW (ref 39.0–52.0)
Hemoglobin: 11.5 g/dL — ABNORMAL LOW (ref 13.0–17.0)
MCH: 28.1 pg (ref 26.0–34.0)
MCHC: 32.7 g/dL (ref 30.0–36.0)
MCV: 86.1 fL (ref 78.0–100.0)
PLATELETS: 174 10*3/uL (ref 150–400)
RBC: 4.09 MIL/uL — AB (ref 4.22–5.81)
RDW: 13.7 % (ref 11.5–15.5)
WBC: 10.7 10*3/uL — ABNORMAL HIGH (ref 4.0–10.5)

## 2017-01-10 LAB — BASIC METABOLIC PANEL
Anion gap: 5 (ref 5–15)
BUN: 30 mg/dL — ABNORMAL HIGH (ref 6–20)
CO2: 33 mmol/L — ABNORMAL HIGH (ref 22–32)
Calcium: 7.9 mg/dL — ABNORMAL LOW (ref 8.9–10.3)
Chloride: 92 mmol/L — ABNORMAL LOW (ref 101–111)
Creatinine, Ser: 1.15 mg/dL (ref 0.61–1.24)
GFR calc Af Amer: >60 mL/min (ref >60)
GFR calc non Af Amer: >60 mL/min (ref >60)
Glucose, Bld: 154 mg/dL — ABNORMAL HIGH (ref 65–99)
Potassium: 3.8 mmol/L (ref 3.5–5.1)
Sodium: 130 mmol/L — ABNORMAL LOW (ref 135–145)

## 2017-01-10 LAB — COOXEMETRY PANEL
CARBOXYHEMOGLOBIN: 1 % (ref 0.5–1.5)
METHEMOGLOBIN: 1.1 % (ref 0.0–1.5)
O2 SAT: 61.8 %
Total hemoglobin: 11.8 g/dL — ABNORMAL LOW (ref 12.0–16.0)

## 2017-01-10 LAB — CULTURE, BLOOD (ROUTINE X 2): SPECIAL REQUESTS: ADEQUATE

## 2017-01-10 MED ORDER — DEXTROSE 5 % IV SOLN
1.0000 g | Freq: Two times a day (BID) | INTRAVENOUS | Status: DC
  Administered 2017-01-10 – 2017-01-11 (×3): 1 g via INTRAVENOUS
  Filled 2017-01-10 (×3): qty 1

## 2017-01-10 NOTE — Progress Notes (Signed)
Pharmacy Antibiotic Note  Collin Young is a 66 y.o. male with concern of PNA. Pharmacy has been consulted for  Cefepime dosing.CXR with worsening opacities compatible with PNA vs. pulmonary edema. Blood cultures show MSSA bacteremia as well. ID is following and recommends 7 days of cefepime for HCAP treatment, then switch to Ancef for a total of 14 days. Patient is afebrile, WBC up from 8.1 to 10.7.   Plan: -Continue cefepime 1gm IV q12hr -Follow renal function, cultures and clinical progress -Follow length of therapy and change abx on D7 of cefepime   Height: 5\' 6"  (167.6 cm) Weight: 120 lb 8 oz (54.7 kg) IBW/kg (Calculated) : 63.8  Temp (24hrs), Avg:99.2 F (37.3 C), Min:98.8 F (37.1 C), Max:99.6 F (37.6 C)   Recent Labs Lab 01/06/17 0423 01/07/17 0214 01/08/17 0410 01/09/17 0451 01/10/17 0500  WBC 8.0  --  8.1  --  10.7*  CREATININE 1.72* 2.01* 2.02* 1.33* 1.15    Estimated Creatinine Clearance: 48.9 mL/min (by C-G formula based on SCr of 1.15 mg/dL).    No Known Allergies  Antimicrobials this admission: 5/10 vanc>>5/12 5/10 cefepime>> 5/11 fluconazole>>  Microbiology results: 5/10 Blood cx - MSSA (pan-sens) 5/10 MRSA pcr -negative 5/12 Blood cx - GPCs in clusters, BCID with MSSA    Argie Ramming, PharmD Pharmacy Resident  Pager [phone] 01/10/17 10:57 AM

## 2017-01-10 NOTE — Consult Note (Signed)
Consultation Note Date: 01/10/2017   Patient Name: Collin Young  DOB: [birth year]  MRN: [id number]  Age / Sex: 66 y.o., male  PCP: [name], MD Referring Physician: Patrecia Pour, MD  Reason for Consultation: Establishing goals of care and Psychosocial/spiritual support  HPI/Patient Profile: 66 y.o. 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 Patient was admitted on 01/02/2017 with similar complaints. Echocardiogram revealed CHF with an ejection fraction of 10-15%. This is a new diagnosis for patient and his family. Since being hospitalized he underwent a heart catheterization on 01/05/2017 where he was found to have multivessel disease with a 99% occlusion of RCA moderate to left main Additionally patient was found to have pneumonia and per speech evaluation is aspirating all consistencies. Patient has a history as noted of esophageal cancer with chemotherapy as well as radiation and dysphagia.   Clinical Assessment and Goals of Care: Patient is alert, very weak and very hard of hearing. He shares with his daughters who are at the bedside that he is leaving the decision making up to them. Cardiology spoke to family during our meeting as well as Dr. Bonner Puna  Daughters Minna Antis and Marlane Mingle.     SUMMARY OF RECOMMENDATIONS   Family elected DO NOT RESUSCITATE which I feel like is a sound decision given his weakened condition, frailty and underlying heart disease Patient would be willing to undergo another PEG tube placement in the setting of frank aspiration. I believe the plan is to recheck his swallow evaluation before going forward but this is something that he would be willing to do. He has had one in the past Family does need  additional information from cardiology regarding the risks and benefits of stent placement in the setting of multivessel disease. They have not firmly decided against the procedure but are leaning that way Patient's main goal is to be comfortable and to be able to return to his home and some level of independence He would be willing to go for short term rehabilitation if this would help him to maintain his independence I would be concerned about this individual being able to handle going home on milrinone given that he lives alone and has been very active and this decline has been very sudden for him Code Status/Advance Care Planning:  DNR  Palliative Prophylaxis:   Aspiration, Bowel Regimen, Delirium Protocol, Eye Care, Frequent Pain Assessment, Oral Care and Turn Reposition  Additional Recommendations (Limitations, Scope, Preferences):  No Chemotherapy and No Tracheostomy  Psycho-social/Spiritual:   Desire for further Chaplaincy support:no  Additional Recommendations: Grief/Bereavement Support  Prognosis:   < 6 months in the setting of congestive heart failure with an ejection fraction of 10-15%, dysphagia with frank aspiration, multi vessel cardiac disease with 99% occlusion to RCA and moderate occlusion to left main.  Discharge Planning: [place] for rehab with Palliative care  service follow-up      Primary Diagnoses: Present on Admission: . Acute on chronic combined systolic and diastolic CHF (congestive heart failure) ([place]) . Malignant neoplasm of pyriform sinus ([facility]) . AKI (acute kidney injury) ([place])   I have reviewed the medical record, interviewed the patient and family, and examined the patient. The following aspects are pertinent.  Past Medical History:  Diagnosis Date  . Cancer [facility])    Esophagus- radiation 2011  . GERD (gastroesophageal reflux disease)    takes zantac prn  . Headache   . History of radiation therapy 10/19/2013-12/05/2013    pyriform sinus 69.96 Gy/12fx  . HOH (hard of hearing)   . Hyperlipemia   . Hypertension   . Seizures ([place])    alcohol withdrawls- 2001   Social History   Social History  . Marital status: Legally Separated    Spouse name: N/A  . Number of children: N/A  . Years of education: N/A   Social History Main Topics  . Smoking status: Former Smoker    Packs/day: 1.00    Years: 40.00    Types: Cigarettes    Start date: 11/08/1960    Quit date: 09/01/1999  . Smokeless tobacco: Former Systems developer    Types: [place] date: 09/01/1999     Comment: quit in 2011  . Alcohol use No     Comment: quit in 2001  . Drug use: No     Comment: back in the day used cocaine,alcohol, marijuana  . Sexual activity: Not Asked   Other Topics Concern  . None   Social History Narrative  . None   History reviewed. No pertinent family history. Scheduled Meds: . atorvastatin  20 mg Oral Daily  . budesonide (PULMICORT) nebulizer solution  0.5 mg Nebulization BID  . carvedilol  6.25 mg Oral BID WC  . multivitamin with minerals  1 tablet Oral Daily  . sodium chloride flush  3 mL Intravenous Q12H   Continuous Infusions: . sodium chloride    . ceFEPime (MAXIPIME) IV 1 g (01/10/17 0959)  . feeding supplement (JEVITY 1.2 CAL) 1,000 mL (01/10/17 0947)  . fluconazole (DIFLUCAN) IV Stopped (01/09/17 1955)  . milrinone 0.25 mcg/kg/min (01/10/17 0948)  . nitroGLYCERIN 16.667 mcg/min (01/09/17 2135)   PRN Meds:.sodium chloride, acetaminophen, guaiFENesin-dextromethorphan, ipratropium-albuterol, lip balm, MUSCLE RUB, ondansetron (ZOFRAN) IV, phenol, polyethylene glycol, polyvinyl alcohol, sodium chloride, sodium chloride flush, sodium chloride flush Medications Prior to Admission:  Prior to Admission medications   Medication Sig Start Date End Date Taking? Authorizing Provider  atorvastatin (LIPITOR) 20 MG tablet Take 20 mg by mouth daily. 12/30/16  Yes [provider]  fentaNYL (DURAGESIC - DOSED MCG/HR) 12  MCG/HR Place 1 patch every 3 days to the skin 12/22/16  Yes Ennever, Rudell Cobb, MD  Multiple Vitamin (MULTIVITAMIN WITH MINERALS) TABS tablet Take 1 tablet by mouth daily.   Yes [provider]   No Known Allergies Review of Systems  Unable to perform ROS: Acuity of condition    Physical Exam  Vital Signs: BP 131/82   Pulse 82   Temp 98.8 F (37.1 C) (Oral)   Resp 19   Ht 5\' 6"  (1.676 m)   Wt 54.7 kg (120 lb 8 oz)   SpO2 98%   BMI 19.45 kg/m  Pain Assessment: No/denies pain POSS *See Group Information*: 1-Acceptable,Awake and alert Pain Score: 0-No pain   SpO2: SpO2: 98 % O2 Device:SpO2: 98 % O2 Flow Rate: .O2 Flow Rate (L/min): 4  L/min  IO: Intake/output summary:  Intake/Output Summary (Last 24 hours) at 01/10/17 1101 Last data filed at 01/10/17 0600  Gross per 24 hour  Intake           928.67 ml  Output              425 ml  Net           503.67 ml    LBM: Last BM Date: 01/08/17 Baseline Weight: Weight: 54.4 kg (120 lb) Most recent weight: Weight: 54.7 kg (120 lb 8 oz)     Palliative Assessment/Data:   Flowsheet Rows     Most Recent Value  Intake Tab  Referral Department  Hospitalist  Unit at Time of Referral  Cardiac/Telemetry Unit  Palliative Care Primary Diagnosis  Cardiac  Date Notified  01/09/17  Palliative Care Type  New Palliative care  Reason for referral  Clarify Goals of Care  Date of Admission  01/02/17  Date first seen by Palliative Care  01/10/17  # of days Palliative referral response time  1 Day(s)  # of days IP prior to Palliative referral  7  Clinical Assessment  Palliative Performance Scale Score  30%  Pain Max last 24 hours  Not able to report  Pain Min Last 24 hours  Not able to report  Dyspnea Max Last 24 Hours  Not able to report  Dyspnea Min Last 24 hours  Not able to report  Nausea Max Last 24 Hours  Not able to report  Nausea Min Last 24 Hours  Not able to report  Anxiety Max Last 24 Hours  Not able to report  Anxiety  Min Last 24 Hours  Not able to report  Other Max Last 24 Hours  Not able to report  Psychosocial & Spiritual Assessment  Palliative Care Outcomes  Patient/Family meeting held?  Yes  Who was at the meeting?  pt, his 2 daughters and niece  Palliative Care Outcomes  Clarified goals of care  Patient/Family wishes: Interventions discontinued/not started   Mechanical Ventilation, Trach  Palliative Care follow-up planned  Yes, Facility      Time In: 0800 Time Out: 1000 Time Total: 120 min Greater than 50%  of this time was spent counseling and coordinating care related to the above assessment and plan. Staffed with Dr. Bonner Puna  Signed by: Dory Horn, NP   Please contact Palliative Medicine Team phone at [phone] for questions and concerns.  For individual provider: See Shea Evans

## 2017-01-10 NOTE — Progress Notes (Signed)
Pt had 2 incidents with spills from his urinal so output for 1900-0700 is incorrect.

## 2017-01-10 NOTE — Progress Notes (Signed)
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 He presented to the emergency department complaining of progressive shortness of breath for the past several weeks. He was admitted with concerns of CHF, with BNP significantly elevated at 2000. Echocardiogram revealed ejection fraction of 10-15% patient was started on beta blockers Lasix and Aldactone. Patient also on admission was found to be on acute kidney injury. Cardiology consulted, underwent cardiac cath showing significant multivessel disease. Recommended staged PCI when creatinine improves. Dysphagia was evaluated with recommendation per SLP for total NPO status, NG tube and reevaluation once clinical status improved. Blood cultures have been positive for MSSA and ID is making recommendations. Palliative care has been consulted and after family meeting, patient is DNR but would like to pursue PEG tube if indicated. They and cardiology are considering risks and benefits of revascularization of the RCA.  Subjective: Patient feels better today, throat irritation improving. Family at bedside.   Assessment & Plan: Acute respiratory failure with hypoxia: Due to ischemic cardiomyopathy/acute systolic CHF, pulmonary edema, and likely aspiration pneumonitis/pneumonia. CXR show worsening air space disease, pneumonia vs pulm edema, he is afebrile and no leukocytosis.  - Continue oxygen via nasal cannula as needed. Continue SDU level of care  - Continue nebs per pulmonology - Continue antibiotics as below - Repeat CXR in AM - Sputum culture sent  Acute systolic heart failure: New diagnosis, due to multivessel CAD.  - NTG gtt for pulmonary  edema. Holding lasix for AKI. - Continue coreg 6.25 mg titrated as tolerated  - ACE and aldactone on hold due to AKI   - Daily weight, I&O's low salt diet  - Co-ox improving but still low, milrinone per cardiology.   Ischemic cardiomyopathy: Left main and LAD with moderate disease and chronically occluded LCx in addition to 99% stenosis of RCA thought to be amenable to atherectomy/staged PCI pending renal function.  - Discussed at length with Dr. Wynonia Lawman as well as the family the risks and benefits of revascularization. There is no guarantee of improvement in EF with stenting and he has no chest pain; with risks of death during procedure. The family would like to discuss this with cardiology directly, and reevaluate his fitness for the procedure after treatment of other ailments.   AKI: Suspect cardiorenal syndrome vs from contrast vs diuretics. Poor CO leading to poor kidney perfusion. FENa 0.64% suggestive of prerenal etiology. - Monitor daily - Renal US show complex left cystic mass, warrants additional surveillance with MRI or CT with contrast will defer after patient stable given patient will receive multiple contrast doses for cardiac cath. This can be follow as outpatient as well.   MSSA bacteremia: in 1 of 2 blood cultures from 5/10 and again on 5/12. PCT elevated > 10. - ID recommending cefepime x7 days for aspiration, convert to ancef for total 14 days.  - Functional status insufficient for TEE. Could consider pulling port vs. empirically treating with long duration.  - Repeat blood cultures 5/14   Enlarged prostate: Seen on U/S. No LUTS/BOO. PSA normal.  - No medications given lack of symptoms.   Hypertension: Per patient was taken off his BP meds by PCP PTA  - Continue coreg and NTG gtt as tolerated  Severe aspiration: Related to h/o radiation to esophagus, MBSS 5/11 demonstrated frank aspiration of all consistencies.  - NPO - Cortrak placed, nutrition consulted.  - Reevaluate  by SLP next week and consider PEG (has had this placed in the past) if no improvement - Covering for respiratory anaerobes as above.   Squamous cell CA of the left piriform sinus and esophagus: In remission  - Continuing home fentanyl patch.   Hyponatremia: Suspect hypervolemia, improving. - Monitor  DVT prophylaxis: Lovenox  Code Status: Full Family Communication: Attended family meeting this AM with patient's daughters and niece.  Disposition Plan: Continues milrinone and NTG gtt's, continue SDU level of care.    Consultants:   Cardiology   PCCM  ID   Palliative care  Procedures:   ECHO 01/03/17 - Left ventricle: The cavity size was normal. Wall thickness was   normal. The estimated ejection fraction was in the range of 10%   to 15%. Diffuse hypokinesis. DIastolic function is abnormal,   indeterminant grade. - Aortic valve: There was moderate regurgitation. Valve area (VTI):   1.08 cm^2. Valve area (Vmax): 1.5 cm^2. Valve area (Vmean): 1.5   cm^2. - Mitral valve: There was moderate regurgitation. - Left atrium: The atrium was mildly dilated. - Right ventricle: Systolic function was mildly to moderately   reduced. - Tricuspid valve: There was moderate regurgitation. - Pulmonary arteries: Systolic pressure was moderately increased.   PA peak pressure: 49 mm Hg (S).  Right/Left Heart Cath and Coronary Angiography 01/05/2017     Prox RCA to Mid RCA lesion, 99 %stenosed.  Mid RCA lesion, 30 %stenosed.  Dist RCA lesion, 40 %stenosed.  RPDA lesion, 20 %stenosed.  Prox Cx lesion, 100 %stenosed.  Ost Ramus to Ramus lesion, 50 %stenosed.  Ost 3rd Diag to 3rd Diag lesion, 30 %stenosed.  Mid LAD lesion, 40 %stenosed.  Ost LAD to Prox LAD lesion, 20 %stenosed.  Ost LM to LM lesion, 40 %stenosed.  Hemodynamic findings consistent with mild pulmonary hypertension.  1. Severe calcific disease in the proximal to mid RCA 2. Chronic total occlusion of the proximal  Circumflex 3. Moderate left main and mid LAD stenosis 4. Elevated filling pressures  Recommendations: He has multivessel CAD. I think the RCA can be approached with PCI but will likely require atherectomy prior to stenting. I would treat the remainder of his CAD medically. Will stage PCI given renal insufficiency. Hydrate gently tonight. May need diuresis tomorrow given elevated filling pressures. Would anticipate PCI on Thursday or Friday of this week if renal function remains stable.    Antimicrobials: Vanc/cefepime > cefepime 5/12.   Objective: Vitals:   01/10/17 0514 01/10/17 0917 01/10/17 1034 01/10/17 1339  BP: 116/69 131/82  122/67  Pulse: 82   81  Resp: 19   18  Temp: 98.8 F (37.1 C)   98.4 F (36.9 C)  TempSrc: Oral   Oral  SpO2: 100%  98% 98%  Weight: 54.7 kg (120 lb 8 oz)     Height:        Intake/Output Summary (Last 24 hours) at 01/10/17 1502 Last data filed at 01/10/17 1029  Gross per 24 hour  Intake           928.67 ml  Output              925 ml  Net             3.67 ml   Filed Weights   01/08/17 0500 01/09/17 0500 01/10/17 0514  Weight: 55.2 kg (121 lb 9.6 oz) 55.2 kg (121 lb 11.2 oz) 54.7 kg (120 lb 8 oz)    Examination: General exam: Frail, chronically ill-appearing male in no distress. Hoarse voice, cortrak tube in place. Respiratory system: No accessory muscle use on 4L by [place]. Coarse throughout with bilateral crackles (R > L) though improved from prior.  Cardiovascular system: Regular, no murmur, no JVD   Gastrointestinal system: Abd Soft NTND, +BS Extremities: No LE edema  Skin: No lesions or rash. Right upper chest port site c/d/i  Data Reviewed: I have personally reviewed following labs and imaging studies  CBC:  Recent Labs Lab 01/06/17 0423 01/08/17 0410 01/10/17 0500  WBC 8.0 8.1 10.7*  HGB 13.8 11.5* 11.5*  HCT 42.0 34.8* 35.2*  MCV 86.1 84.7 86.1  PLT 189 144* 643   Basic Metabolic Panel:  Recent Labs Lab 01/06/17 0423  01/07/17 0214 01/07/17 2035 01/08/17 0410 01/09/17 0451 01/10/17 0500  NA 132* 127*  --  125* 128* 130*  K 5.1 5.1  --  4.4 3.9 3.8  CL 90* 89*  --  86* 88* 92*  CO2 29 19*  --  27 32 33*  GLUCOSE 119* 123*  --  91 122* 154*  BUN 42* 52*  --  66* 47* 30*  CREATININE 1.72* 2.01*  --  2.02* 1.33* 1.15  CALCIUM 8.9 8.5*  --  8.3* 8.0* 7.9*  MG  --   --  2.5* 2.7*  --   --   PHOS  --   --  2.8 3.5  --   --    GFR: Estimated Creatinine Clearance: 48.9 mL/min (by C-G formula based on SCr of 1.15 mg/dL). Liver Function Tests: No results for input(s): AST, ALT, ALKPHOS, BILITOT, PROT, ALBUMIN in the last 168 hours. Recent Results (from the past 240 hour(s))  Culture, blood (Routine X 2) w Reflex to ID Panel     Status: None (Preliminary result)   Collection Time: 01/07/17  3:43 PM  Result Value Ref Range Status   Specimen Description BLOOD RIGHT ANTECUBITAL  Final   Special Requests IN PEDIATRIC BOTTLE Blood Culture adequate volume  Final   Culture NO GROWTH 3 DAYS  Final   Report Status PENDING  Incomplete  Culture, blood (Routine X 2) w Reflex to ID Panel     Status: Abnormal   Collection Time: 01/07/17  3:47 PM  Result Value Ref Range Status   Specimen Description BLOOD RIGHT HAND  Final   Special Requests IN PEDIATRIC BOTTLE Blood Culture adequate volume  Final   Culture  Setup Time   Final    GRAM POSITIVE COCCI IN CLUSTERS IN PEDIATRIC BOTTLE CRITICAL RESULT CALLED TO, READ BACK BY AND VERIFIED WITH: Leonie Green Pharm.D. 13:30 01/08/17 ([email or web address])    Culture STAPHYLOCOCCUS AUREUS (A)  Final   Report Status 01/10/2017 FINAL  Final   Organism ID, Bacteria STAPHYLOCOCCUS AUREUS  Final      Susceptibility   Staphylococcus aureus - MIC*    CIPROFLOXACIN <=0.5 SENSITIVE Sensitive     ERYTHROMYCIN <=0.25 SENSITIVE Sensitive     GENTAMICIN <=0.5 SENSITIVE Sensitive     OXACILLIN 0.5 SENSITIVE Sensitive     TETRACYCLINE <=1 SENSITIVE Sensitive     VANCOMYCIN <=0.5 SENSITIVE Sensitive      TRIMETH/SULFA <=10 SENSITIVE Sensitive     CLINDAMYCIN <=0.25 SENSITIVE Sensitive     RIFAMPIN <=0.5 SENSITIVE Sensitive     Inducible Clindamycin NEGATIVE Sensitive     *  STAPHYLOCOCCUS AUREUS  Blood Culture ID Panel (Reflexed)     Status: Abnormal   Collection Time: 01/07/17  3:47 PM  Result Value Ref Range Status   Enterococcus species NOT DETECTED NOT DETECTED Final   Listeria monocytogenes NOT DETECTED NOT DETECTED Final   Staphylococcus species DETECTED (A) NOT DETECTED Final    Comment: CRITICAL RESULT CALLED TO, READ BACK BY AND VERIFIED WITH: M. Radford Pax Pharm.D. 13:30 01/08/17 ([email or web address])    Staphylococcus aureus DETECTED (A) NOT DETECTED Final    Comment: Methicillin (oxacillin) susceptible Staphylococcus aureus (MSSA). Preferred therapy is anti staphylococcal beta lactam antibiotic (Cefazolin or Nafcillin), unless clinically contraindicated. CRITICAL RESULT CALLED TO, READ BACK BY AND VERIFIED WITH: M. Radford Pax Pharm.D. 13:30 01/08/17 ([email or web address])    Methicillin resistance NOT DETECTED NOT DETECTED Final   Streptococcus species NOT DETECTED NOT DETECTED Final   Streptococcus agalactiae NOT DETECTED NOT DETECTED Final   Streptococcus pneumoniae NOT DETECTED NOT DETECTED Final   Streptococcus pyogenes NOT DETECTED NOT DETECTED Final   Acinetobacter baumannii NOT DETECTED NOT DETECTED Final   Enterobacteriaceae species NOT DETECTED NOT DETECTED Final   Enterobacter cloacae complex NOT DETECTED NOT DETECTED Final   Escherichia coli NOT DETECTED NOT DETECTED Final   Klebsiella oxytoca NOT DETECTED NOT DETECTED Final   Klebsiella pneumoniae NOT DETECTED NOT DETECTED Final   Proteus species NOT DETECTED NOT DETECTED Final   Serratia marcescens NOT DETECTED NOT DETECTED Final   Haemophilus influenzae NOT DETECTED NOT DETECTED Final   Neisseria meningitidis NOT DETECTED NOT DETECTED Final   Pseudomonas aeruginosa NOT DETECTED NOT DETECTED Final   Candida albicans NOT DETECTED NOT  DETECTED Final   Candida glabrata NOT DETECTED NOT DETECTED Final   Candida krusei NOT DETECTED NOT DETECTED Final   Candida parapsilosis NOT DETECTED NOT DETECTED Final   Candida tropicalis NOT DETECTED NOT DETECTED Final  MRSA PCR Screening     Status: None   Collection Time: 01/07/17  5:21 PM  Result Value Ref Range Status   MRSA by PCR NEGATIVE NEGATIVE Final    Comment:        The GeneXpert MRSA Assay (FDA approved for NASAL specimens only), is one component of a comprehensive MRSA colonization surveillance program. It is not intended to diagnose MRSA infection nor to guide or monitor treatment for MRSA infections.   Culture, blood (Routine X 2) w Reflex to ID Panel     Status: None (Preliminary result)   Collection Time: 01/09/17 10:15 AM  Result Value Ref Range Status   Specimen Description BLOOD RIGHT HAND  Final   Special Requests   Final    Blood Culture results may not be optimal due to an inadequate volume of blood received in culture bottles   Culture  Setup Time   Final    GRAM POSITIVE COCCI IN CLUSTERS Organism ID to follow IN PEDIATRIC BOTTLE CRITICAL RESULT CALLED TO, READ BACK BY AND VERIFIED WITH: N. JOHNSTON PHARMD, AT 0848 01/10/17 BY D. VANHOOK    Culture GRAM POSITIVE COCCI  Final   Report Status PENDING  Incomplete  Blood Culture ID Panel (Reflexed)     Status: Abnormal   Collection Time: 01/09/17 10:15 AM  Result Value Ref Range Status   Enterococcus species NOT DETECTED NOT DETECTED Final   Listeria monocytogenes NOT DETECTED NOT DETECTED Final   Staphylococcus species DETECTED (A) NOT DETECTED Final    Comment: CRITICAL RESULT CALLED TO, READ BACK BY AND VERIFIED WITH: V. JOHNSTON PHARMD, AT 0848 01/10/17 BY D.  VANHOOK    Staphylococcus aureus DETECTED (A) NOT DETECTED Final    Comment: Methicillin (oxacillin) susceptible Staphylococcus aureus (MSSA). Preferred therapy is anti staphylococcal beta lactam antibiotic (Cefazolin or Nafcillin), unless  clinically contraindicated. CRITICAL RESULT CALLED TO, READ BACK BY AND VERIFIED WITH: V. JOHNSTON PHARMD, AT 0848 01/10/17 BY D. VANHOOK    Methicillin resistance NOT DETECTED NOT DETECTED Final   Streptococcus species NOT DETECTED NOT DETECTED Final   Streptococcus agalactiae NOT DETECTED NOT DETECTED Final   Streptococcus pneumoniae NOT DETECTED NOT DETECTED Final   Streptococcus pyogenes NOT DETECTED NOT DETECTED Final   Acinetobacter baumannii NOT DETECTED NOT DETECTED Final   Enterobacteriaceae species NOT DETECTED NOT DETECTED Final   Enterobacter cloacae complex NOT DETECTED NOT DETECTED Final   Escherichia coli NOT DETECTED NOT DETECTED Final   Klebsiella oxytoca NOT DETECTED NOT DETECTED Final   Klebsiella pneumoniae NOT DETECTED NOT DETECTED Final   Proteus species NOT DETECTED NOT DETECTED Final   Serratia marcescens NOT DETECTED NOT DETECTED Final   Haemophilus influenzae NOT DETECTED NOT DETECTED Final   Neisseria meningitidis NOT DETECTED NOT DETECTED Final   Pseudomonas aeruginosa NOT DETECTED NOT DETECTED Final   Candida albicans NOT DETECTED NOT DETECTED Final   Candida glabrata NOT DETECTED NOT DETECTED Final   Candida krusei NOT DETECTED NOT DETECTED Final   Candida parapsilosis NOT DETECTED NOT DETECTED Final   Candida tropicalis NOT DETECTED NOT DETECTED Final      Radiology Studies: Dg Abd 1 View  Result Date: 01/08/2017 CLINICAL DATA:  Feeding tube placement. EXAM: ABDOMEN - 1 VIEW COMPARISON:  12/03/2013 FINDINGS: Enteric feeding tube tip projects in the stomach. There is residual contrast within the stomach, also within the small bowel and colon. Interstitial hazy airspace opacities are noted in the visualized lower lungs, with evidence of small pleural effusions. IMPRESSION: 1. Enteric feeding tube tip projects in the mid stomach. Electronically Signed   By: Lajean Manes M.D.   On: 01/08/2017 15:47   Dg Chest Port 1 View  Result Date: 01/09/2017 CLINICAL  DATA:  Respiratory failure EXAM: PORTABLE CHEST 1 VIEW COMPARISON:  01/08/2017 FINDINGS: Progression of bibasilar airspace disease, possible pneumonia. Small pleural effusions. Interval placement of feeding tube in the stomach. Port-A-Cath tip in the SVC unchanged. IMPRESSION: Progression of bibasilar infiltrates, possible pneumonia or edema. Feeding tube in the stomach. Electronically Signed   By: Franchot Gallo M.D.   On: 01/09/2017 08:19   Scheduled Meds: . atorvastatin  20 mg Oral Daily  . budesonide (PULMICORT) nebulizer solution  0.5 mg Nebulization BID  . carvedilol  6.25 mg Oral BID WC  . multivitamin with minerals  1 tablet Oral Daily  . sodium chloride flush  3 mL Intravenous Q12H   Continuous Infusions: . sodium chloride    . ceFEPime (MAXIPIME) IV Stopped (01/10/17 1029)  . feeding supplement (JEVITY 1.2 CAL) 1,000 mL (01/10/17 0947)  . fluconazole (DIFLUCAN) IV Stopped (01/09/17 1955)  . milrinone 0.25 mcg/kg/min (01/10/17 0948)  . nitroGLYCERIN 16.667 mcg/min (01/09/17 2135)     LOS: 8 days   Vance Gather, MD Pager: Text Page via [email or web address]  [phone]   If 7PM-7AM, please contact night-coverage [email or web address] Password [email or web address] 01/10/2017, 3:02 PM

## 2017-01-10 NOTE — Progress Notes (Signed)
INFECTIOUS DISEASE PROGRESS NOTE  ID: Collin Young is a 66 y.o. male with  Active Problems:   Malignant neoplasm of pyriform sinus (HCC)   Acute on chronic combined systolic and diastolic CHF (congestive heart failure) (HCC)   AKI (acute kidney injury) ([place])   Acute pulmonary edema (HCC)   Congestive dilated cardiomyopathy (HCC)   Acute congestive heart failure (HCC)   Ischemic cardiomyopathy   Acute hypoxemic respiratory failure ([place])   Palliative care by specialist  Subjective: Resting, awakens easily.  No complaints.   Abtx:  Anti-infectives    Start     Dose/Rate Route Frequency Ordered Stop   01/10/17 1000  ceFEPIme (MAXIPIME) 1 g in dextrose 5 % 50 mL IVPB     1 g 100 mL/hr over 30 Minutes Intravenous Every 12 hours 01/10/17 0922     01/08/17 1730  fluconazole (DIFLUCAN) IVPB 100 mg  Status:  Discontinued     100 mg 50 mL/hr over 60 Minutes Intravenous Every 24 hours 01/08/17 1725 01/08/17 1725   01/08/17 1730  fluconazole (DIFLUCAN) IVPB 100 mg     100 mg 50 mL/hr over 60 Minutes Intravenous Every 24 hours 01/08/17 1725 01/15/17 1729   01/08/17 1600  vancomycin (VANCOCIN) 500 mg in sodium chloride 0.9 % 100 mL IVPB  Status:  Discontinued     500 mg 100 mL/hr over 60 Minutes Intravenous Every 24 hours 01/07/17 1449 01/09/17 0829   01/07/17 1530  vancomycin (VANCOCIN) IVPB 1000 mg/200 mL premix     1,000 mg 200 mL/hr over 60 Minutes Intravenous  Once 01/07/17 1449 01/07/17 1719   01/07/17 1530  ceFEPIme (MAXIPIME) 1 g in dextrose 5 % 50 mL IVPB  Status:  Discontinued     1 g 100 mL/hr over 30 Minutes Intravenous Every 24 hours 01/07/17 1449 01/10/17 0922      Medications:  Scheduled: . atorvastatin  20 mg Oral Daily  . budesonide (PULMICORT) nebulizer solution  0.5 mg Nebulization BID  . carvedilol  6.25 mg Oral BID WC  . multivitamin with minerals  1 tablet Oral Daily  . sodium chloride flush  3 mL Intravenous Q12H    Objective: Vital signs in last 24  hours: Temp:  [98.8 F (37.1 C)-99.6 F (37.6 C)] 98.8 F (37.1 C) (05/13 0514) Pulse Rate:  [79-86] 82 (05/13 0514) Resp:  [17-22] 19 (05/13 0514) BP: (116-141)/(69-82) 131/82 (05/13 0917) SpO2:  [98 %-100 %] 98 % (05/13 1034) Weight:  [54.7 kg (120 lb 8 oz)] 54.7 kg (120 lb 8 oz) (05/13 0514)   General appearance: fatigued and no distress Resp: clear to auscultation bilaterally Chest wall: port non-tender, no fluctuance.  Cardio: regular rate and rhythm GI: normal findings: bowel sounds normal and soft, non-tender  Lab Results  Recent Labs  01/08/17 0410 01/09/17 0451 01/10/17 0500  WBC 8.1  --  10.7*  HGB 11.5*  --  11.5*  HCT 34.8*  --  35.2*  NA 125* 128* 130*  K 4.4 3.9 3.8  CL 86* 88* 92*  CO2 27 32 33*  BUN 66* 47* 30*  CREATININE 2.02* 1.33* 1.15   Liver Panel No results for input(s): PROT, ALBUMIN, AST, ALT, ALKPHOS, BILITOT, BILIDIR, IBILI in the last 72 hours. Sedimentation Rate No results for input(s): ESRSEDRATE in the last 72 hours. C-Reactive Protein No results for input(s): CRP in the last 72 hours.  Microbiology: Recent Results (from the past 240 hour(s))  Culture, blood (Routine X 2) w Reflex  to ID Panel     Status: None (Preliminary result)   Collection Time: 01/07/17  3:43 PM  Result Value Ref Range Status   Specimen Description BLOOD RIGHT ANTECUBITAL  Final   Special Requests IN PEDIATRIC BOTTLE Blood Culture adequate volume  Final   Culture NO GROWTH 3 DAYS  Final   Report Status PENDING  Incomplete  Culture, blood (Routine X 2) w Reflex to ID Panel     Status: Abnormal   Collection Time: 01/07/17  3:47 PM  Result Value Ref Range Status   Specimen Description BLOOD RIGHT HAND  Final   Special Requests IN PEDIATRIC BOTTLE Blood Culture adequate volume  Final   Culture  Setup Time   Final    GRAM POSITIVE COCCI IN CLUSTERS IN PEDIATRIC BOTTLE CRITICAL RESULT CALLED TO, READ BACK BY AND VERIFIED WITH: Leonie Green Pharm.D. 13:30 01/08/17  ([email or web address])    Culture STAPHYLOCOCCUS AUREUS (A)  Final   Report Status 01/10/2017 FINAL  Final   Organism ID, Bacteria STAPHYLOCOCCUS AUREUS  Final      Susceptibility   Staphylococcus aureus - MIC*    CIPROFLOXACIN <=0.5 SENSITIVE Sensitive     ERYTHROMYCIN <=0.25 SENSITIVE Sensitive     GENTAMICIN <=0.5 SENSITIVE Sensitive     OXACILLIN 0.5 SENSITIVE Sensitive     TETRACYCLINE <=1 SENSITIVE Sensitive     VANCOMYCIN <=0.5 SENSITIVE Sensitive     TRIMETH/SULFA <=10 SENSITIVE Sensitive     CLINDAMYCIN <=0.25 SENSITIVE Sensitive     RIFAMPIN <=0.5 SENSITIVE Sensitive     Inducible Clindamycin NEGATIVE Sensitive     * STAPHYLOCOCCUS AUREUS  Blood Culture ID Panel (Reflexed)     Status: Abnormal   Collection Time: 01/07/17  3:47 PM  Result Value Ref Range Status   Enterococcus species NOT DETECTED NOT DETECTED Final   Listeria monocytogenes NOT DETECTED NOT DETECTED Final   Staphylococcus species DETECTED (A) NOT DETECTED Final    Comment: CRITICAL RESULT CALLED TO, READ BACK BY AND VERIFIED WITH: MRadford Pax Pharm.D. 13:30 01/08/17 ([email or web address])    Staphylococcus aureus DETECTED (A) NOT DETECTED Final    Comment: Methicillin (oxacillin) susceptible Staphylococcus aureus (MSSA). Preferred therapy is anti staphylococcal beta lactam antibiotic (Cefazolin or Nafcillin), unless clinically contraindicated. CRITICAL RESULT CALLED TO, READ BACK BY AND VERIFIED WITH: M. Radford Pax Pharm.D. 13:30 01/08/17 ([email or web address])    Methicillin resistance NOT DETECTED NOT DETECTED Final   Streptococcus species NOT DETECTED NOT DETECTED Final   Streptococcus agalactiae NOT DETECTED NOT DETECTED Final   Streptococcus pneumoniae NOT DETECTED NOT DETECTED Final   Streptococcus pyogenes NOT DETECTED NOT DETECTED Final   Acinetobacter baumannii NOT DETECTED NOT DETECTED Final   Enterobacteriaceae species NOT DETECTED NOT DETECTED Final   Enterobacter cloacae complex NOT DETECTED NOT DETECTED Final   Escherichia coli NOT  DETECTED NOT DETECTED Final   Klebsiella oxytoca NOT DETECTED NOT DETECTED Final   Klebsiella pneumoniae NOT DETECTED NOT DETECTED Final   Proteus species NOT DETECTED NOT DETECTED Final   Serratia marcescens NOT DETECTED NOT DETECTED Final   Haemophilus influenzae NOT DETECTED NOT DETECTED Final   Neisseria meningitidis NOT DETECTED NOT DETECTED Final   Pseudomonas aeruginosa NOT DETECTED NOT DETECTED Final   Candida albicans NOT DETECTED NOT DETECTED Final   Candida glabrata NOT DETECTED NOT DETECTED Final   Candida krusei NOT DETECTED NOT DETECTED Final   Candida parapsilosis NOT DETECTED NOT DETECTED Final   Candida tropicalis NOT DETECTED NOT DETECTED Final  MRSA PCR Screening  Status: None   Collection Time: 01/07/17  5:21 PM  Result Value Ref Range Status   MRSA by PCR NEGATIVE NEGATIVE Final    Comment:        The GeneXpert MRSA Assay (FDA approved for NASAL specimens only), is one component of a comprehensive MRSA colonization surveillance program. It is not intended to diagnose MRSA infection nor to guide or monitor treatment for MRSA infections.   Culture, blood (Routine X 2) w Reflex to ID Panel     Status: None (Preliminary result)   Collection Time: 01/09/17 10:15 AM  Result Value Ref Range Status   Specimen Description BLOOD RIGHT HAND  Final   Special Requests   Final    Blood Culture results may not be optimal due to an inadequate volume of blood received in culture bottles   Culture  Setup Time   Final    GRAM POSITIVE COCCI IN CLUSTERS Organism ID to follow IN PEDIATRIC BOTTLE CRITICAL RESULT CALLED TO, READ BACK BY AND VERIFIED WITH: N. JOHNSTON PHARMD, AT 0848 01/10/17 BY D. VANHOOK    Culture GRAM POSITIVE COCCI  Final   Report Status PENDING  Incomplete  Blood Culture ID Panel (Reflexed)     Status: Abnormal   Collection Time: 01/09/17 10:15 AM  Result Value Ref Range Status   Enterococcus species NOT DETECTED NOT DETECTED Final   Listeria  monocytogenes NOT DETECTED NOT DETECTED Final   Staphylococcus species DETECTED (A) NOT DETECTED Final    Comment: CRITICAL RESULT CALLED TO, READ BACK BY AND VERIFIED WITH: V. JOHNSTON PHARMD, AT 0848 01/10/17 BY D. VANHOOK    Staphylococcus aureus DETECTED (A) NOT DETECTED Final    Comment: Methicillin (oxacillin) susceptible Staphylococcus aureus (MSSA). Preferred therapy is anti staphylococcal beta lactam antibiotic (Cefazolin or Nafcillin), unless clinically contraindicated. CRITICAL RESULT CALLED TO, READ BACK BY AND VERIFIED WITH: V. JOHNSTON PHARMD, AT 0848 01/10/17 BY D. VANHOOK    Methicillin resistance NOT DETECTED NOT DETECTED Final   Streptococcus species NOT DETECTED NOT DETECTED Final   Streptococcus agalactiae NOT DETECTED NOT DETECTED Final   Streptococcus pneumoniae NOT DETECTED NOT DETECTED Final   Streptococcus pyogenes NOT DETECTED NOT DETECTED Final   Acinetobacter baumannii NOT DETECTED NOT DETECTED Final   Enterobacteriaceae species NOT DETECTED NOT DETECTED Final   Enterobacter cloacae complex NOT DETECTED NOT DETECTED Final   Escherichia coli NOT DETECTED NOT DETECTED Final   Klebsiella oxytoca NOT DETECTED NOT DETECTED Final   Klebsiella pneumoniae NOT DETECTED NOT DETECTED Final   Proteus species NOT DETECTED NOT DETECTED Final   Serratia marcescens NOT DETECTED NOT DETECTED Final   Haemophilus influenzae NOT DETECTED NOT DETECTED Final   Neisseria meningitidis NOT DETECTED NOT DETECTED Final   Pseudomonas aeruginosa NOT DETECTED NOT DETECTED Final   Candida albicans NOT DETECTED NOT DETECTED Final   Candida glabrata NOT DETECTED NOT DETECTED Final   Candida krusei NOT DETECTED NOT DETECTED Final   Candida parapsilosis NOT DETECTED NOT DETECTED Final   Candida tropicalis NOT DETECTED NOT DETECTED Final    Studies/Results: Dg Abd 1 View  Result Date: 01/08/2017 CLINICAL DATA:  Feeding tube placement. EXAM: ABDOMEN - 1 VIEW COMPARISON:  12/03/2013 FINDINGS:  Enteric feeding tube tip projects in the stomach. There is residual contrast within the stomach, also within the small bowel and colon. Interstitial hazy airspace opacities are noted in the visualized lower lungs, with evidence of small pleural effusions. IMPRESSION: 1. Enteric feeding tube tip projects in the mid stomach. Electronically  Signed   By: Lajean Manes M.D.   On: 01/08/2017 15:47   Dg Chest Port 1 View  Result Date: 01/09/2017 CLINICAL DATA:  Respiratory failure EXAM: PORTABLE CHEST 1 VIEW COMPARISON:  01/08/2017 FINDINGS: Progression of bibasilar airspace disease, possible pneumonia. Small pleural effusions. Interval placement of feeding tube in the stomach. Port-A-Cath tip in the SVC unchanged. IMPRESSION: Progression of bibasilar infiltrates, possible pneumonia or edema. Feeding tube in the stomach. Electronically Signed   By: Franchot Gallo M.D.   On: 01/09/2017 08:19     Assessment/Plan: HCAP Will narrow anbx to cefepime alone, aim for 7 days of cefepime. Then change to ancef.  Appears to be doing ok. WBC normal, afebrile) O2 requirements stable at 4L  Thrush Day 3/7 fluconazole  MSSA bacteremia Port Repeat BCx (5-12) are again positive.  Could consider removing his port.  Could consider TEE (again, his overall status does not support overly aggressive care) Alternatively, more conservatively, could try to treat through, give him long term anbx. Leave his port in place.  Will recheck his BCx in AM Palliative eval today  SCCa of esophagus  CHF             EF 10-15% CAD    Total days of antibiotics: 3 vanco/cefepime --> cefepime         Bobby Rumpf Infectious Diseases (pager) [phone] [email or web address] 01/10/2017, 10:51 AM  LOS: 8 days

## 2017-01-10 NOTE — Progress Notes (Signed)
Subjective:  He is alert at the bedside and is diuresing reasonably well.  Weight is down 1 pound.  He is still quite hoarse and the feeding tube is in place.  There is an emesis basin with a significant amount of purulent-looking sputum minutes sitting at the bedside.  He had one blood culture that was positive for staph and ID is involved.  2 daughters as well as a knee surgery in the room as well as the palliative care nurse practitioner when I came into the room.  Not much in the way of shortness of breath.  Objective:  Vital Signs in the last 24 hours: BP 116/69 (BP Location: Left Arm)   Pulse 82   Temp 98.8 F (37.1 C) (Oral)   Resp 19   Ht 5\' 6"  (1.676 m)   Wt 54.7 kg (120 lb 8 oz)   SpO2 100%   BMI 19.45 kg/m   Physical Exam: Thin black male in no acute distress, feeding tube in place Lungs:  Rales halfway up on the right side and reduced breath sounds mild rales in the left  Cardiac:  Regular rhythm, normal S1 and S2, no S3 Abdomen:  Soft, nontender, no masses Extremities:  No edema present  Intake/Output from previous day: 05/12 0701 - 05/13 0700 In: 928.7 [I.V.:158.3; NG/GT:720.4; IV Piggyback:50] Out: 725 [Urine:725] Weight Filed Weights   01/08/17 0500 01/09/17 0500 01/10/17 0514  Weight: 55.2 kg (121 lb 9.6 oz) 55.2 kg (121 lb 11.2 oz) 54.7 kg (120 lb 8 oz)    Lab Results: Basic Metabolic Panel:  Recent Labs  01/09/17 0451 01/10/17 0500  NA 128* 130*  K 3.9 3.8  CL 88* 92*  CO2 32 33*  GLUCOSE 122* 154*  BUN 47* 30*  CREATININE 1.33* 1.15    CBC:  Recent Labs  01/08/17 0410 01/10/17 0500  WBC 8.1 10.7*  HGB 11.5* 11.5*  HCT 34.8* 35.2*  MCV 84.7 86.1  PLT 144* 174    BNP    Component Value Date/Time   BNP 2,182.6 (H) 01/05/2017 2671   Telemetry: Sinus rhythm with occasional PACs, personally reviewed  Assessment/Plan:  1.  Acute systolic congestive heart failure with ischemic cardiomyopathy 2.  Acute renal insufficiency improved  somewhat 3.  Coronary artery disease with severe calcific disease in the proximal to mid RCA and moderate left main disease 4.  Pulmonary edema 5.  Dysphasia currently requiring feedings 6.  History of cancer of the esophagus  Recommendations:  His weight is essentially unchanged.  Coox has improved to 61 today  but is still low.  I andO's are inaccurate due to urine spill.  Spoke with the daughters as well as the niece about his conditions.  The role question is the issue of aspiration and the appearance sputum.  He also has significant ischemic cardiomyopathy but does not have any chest pain.  99% right coronary artery stenosis with occlusion of circumflex chronically as well as moderate left main and LAD disease.  We discussed potential risks of intervention with the family and with the complexity and multiplicity of other issues will allow primary team to discuss further whether proceed with PCI or not this week.  Kerry Hough  MD [facility]  01/10/2017, 8:48 AM

## 2017-01-10 NOTE — Progress Notes (Signed)
Patient tolerating 4L nasal cannula well at this time. No respiratory distress noted. BiPAP not indicated at this time. RT will continue to monitor as needed.

## 2017-01-10 NOTE — Progress Notes (Signed)
PHARMACY - PHYSICIAN COMMUNICATION CRITICAL VALUE ALERT - BLOOD CULTURE IDENTIFICATION (BCID)  Results for orders placed or performed during the hospital encounter of 01/02/17  Blood Culture ID Panel (Reflexed) (Collected: 01/09/2017 10:15 AM)  Result Value Ref Range   Enterococcus species NOT DETECTED NOT DETECTED   Listeria monocytogenes NOT DETECTED NOT DETECTED   Staphylococcus species DETECTED (A) NOT DETECTED   Staphylococcus aureus DETECTED (A) NOT DETECTED   Methicillin resistance NOT DETECTED NOT DETECTED   Streptococcus species NOT DETECTED NOT DETECTED   Streptococcus agalactiae NOT DETECTED NOT DETECTED   Streptococcus pneumoniae NOT DETECTED NOT DETECTED   Streptococcus pyogenes NOT DETECTED NOT DETECTED   Acinetobacter baumannii NOT DETECTED NOT DETECTED   Enterobacteriaceae species NOT DETECTED NOT DETECTED   Enterobacter cloacae complex NOT DETECTED NOT DETECTED   Escherichia coli NOT DETECTED NOT DETECTED   Klebsiella oxytoca NOT DETECTED NOT DETECTED   Klebsiella pneumoniae NOT DETECTED NOT DETECTED   Proteus species NOT DETECTED NOT DETECTED   Serratia marcescens NOT DETECTED NOT DETECTED   Haemophilus influenzae NOT DETECTED NOT DETECTED   Neisseria meningitidis NOT DETECTED NOT DETECTED   Pseudomonas aeruginosa NOT DETECTED NOT DETECTED   Candida albicans NOT DETECTED NOT DETECTED   Candida glabrata NOT DETECTED NOT DETECTED   Candida krusei NOT DETECTED NOT DETECTED   Candida parapsilosis NOT DETECTED NOT DETECTED   Candida tropicalis NOT DETECTED NOT DETECTED    Repeat blood cultures from 5/12 are still positive with MSSA. ID following.  Pt is on cefepime with plan to change to Ancef to complete 14 days of therapy. Change cefepime to 1g/12h for improving renal fx.    Harvel Quale 01/10/2017  9:20 AM

## 2017-01-11 DIAGNOSIS — E7800 Pure hypercholesterolemia, unspecified: Secondary | ICD-10-CM

## 2017-01-11 DIAGNOSIS — Z681 Body mass index (BMI) 19 or less, adult: Secondary | ICD-10-CM

## 2017-01-11 DIAGNOSIS — J690 Pneumonitis due to inhalation of food and vomit: Secondary | ICD-10-CM | POA: Diagnosis present

## 2017-01-11 DIAGNOSIS — I209 Angina pectoris, unspecified: Secondary | ICD-10-CM

## 2017-01-11 DIAGNOSIS — Z789 Other specified health status: Secondary | ICD-10-CM

## 2017-01-11 DIAGNOSIS — Z79899 Other long term (current) drug therapy: Secondary | ICD-10-CM

## 2017-01-11 DIAGNOSIS — Z87891 Personal history of nicotine dependence: Secondary | ICD-10-CM

## 2017-01-11 DIAGNOSIS — R7881 Bacteremia: Secondary | ICD-10-CM

## 2017-01-11 DIAGNOSIS — Z4659 Encounter for fitting and adjustment of other gastrointestinal appliance and device: Secondary | ICD-10-CM

## 2017-01-11 DIAGNOSIS — Z95828 Presence of other vascular implants and grafts: Secondary | ICD-10-CM

## 2017-01-11 DIAGNOSIS — R570 Cardiogenic shock: Secondary | ICD-10-CM

## 2017-01-11 LAB — BASIC METABOLIC PANEL
Anion gap: 8 (ref 5–15)
BUN: 24 mg/dL — ABNORMAL HIGH (ref 6–20)
CO2: 29 mmol/L (ref 22–32)
Calcium: 7.4 mg/dL — ABNORMAL LOW (ref 8.9–10.3)
Chloride: 96 mmol/L — ABNORMAL LOW (ref 101–111)
Creatinine, Ser: 0.91 mg/dL (ref 0.61–1.24)
GFR calc Af Amer: >60 mL/min (ref >60)
GFR calc non Af Amer: >60 mL/min (ref >60)
Glucose, Bld: 127 mg/dL — ABNORMAL HIGH (ref 65–99)
Potassium: 4 mmol/L (ref 3.5–5.1)
Sodium: 133 mmol/L — ABNORMAL LOW (ref 135–145)

## 2017-01-11 LAB — CBC
HCT: 33.3 % — ABNORMAL LOW (ref 39.0–52.0)
Hemoglobin: 10.9 g/dL — ABNORMAL LOW (ref 13.0–17.0)
MCH: 28.3 pg (ref 26.0–34.0)
MCHC: 32.7 g/dL (ref 30.0–36.0)
MCV: 86.5 fL (ref 78.0–100.0)
PLATELETS: 172 10*3/uL (ref 150–400)
RBC: 3.85 MIL/uL — ABNORMAL LOW (ref 4.22–5.81)
RDW: 14.1 % (ref 11.5–15.5)
WBC: 12.6 10*3/uL — ABNORMAL HIGH (ref 4.0–10.5)

## 2017-01-11 LAB — COOXEMETRY PANEL
CARBOXYHEMOGLOBIN: 1.2 % (ref 0.5–1.5)
Methemoglobin: 0.9 % (ref 0.0–1.5)
O2 SAT: 90.6 %
Total hemoglobin: 11 g/dL — ABNORMAL LOW (ref 12.0–16.0)

## 2017-01-11 LAB — GLUCOSE, CAPILLARY
GLUCOSE-CAPILLARY: 155 mg/dL — AB (ref 65–99)
Glucose-Capillary: 144 mg/dL — ABNORMAL HIGH (ref 65–99)
Glucose-Capillary: 158 mg/dL — ABNORMAL HIGH (ref 65–99)
Glucose-Capillary: 144 mg/dL — ABNORMAL HIGH (ref 65–99)
Glucose-Capillary: 153 mg/dL — ABNORMAL HIGH (ref 65–99)
Glucose-Capillary: 144 mg/dL — ABNORMAL HIGH (ref 65–99)

## 2017-01-11 LAB — PROCALCITONIN: PROCALCITONIN: 2.59 ng/mL

## 2017-01-11 MED ORDER — CEFAZOLIN SODIUM-DEXTROSE 1-4 GM/50ML-% IV SOLN: 1.0000 g | Freq: Three times a day (TID) | INTRAVENOUS | Status: DC

## 2017-01-11 MED ORDER — ASPIRIN EC 81 MG PO TBEC
81.0000 mg | DELAYED_RELEASE_TABLET | Freq: Every day | ORAL | Status: DC
  Administered 2017-01-11 – 2017-01-15 (×4): 81 mg via ORAL
  Filled 2017-01-11 (×6): qty 1

## 2017-01-11 MED ORDER — CEFAZOLIN SODIUM-DEXTROSE 1-4 GM/50ML-% IV SOLN
1.0000 g | Freq: Three times a day (TID) | INTRAVENOUS | Status: DC
  Administered 2017-01-11 – 2017-01-17 (×19): 1 g via INTRAVENOUS
  Filled 2017-01-11 (×21): qty 50

## 2017-01-11 MED ORDER — DEXTROSE 5 % IV SOLN
1.0000 g | Freq: Three times a day (TID) | INTRAVENOUS | Status: DC
  Filled 2017-01-11: qty 1

## 2017-01-11 NOTE — Progress Notes (Signed)
Daily Progress Note   Patient Name: Collin Young       Date: 01/11/2017 DOB: September 29, 1950  Age: 66 y.o. MRN#: [id number] Attending Physician: Collin Pour, MD Primary Care Physician: Collin Dy, MD Admit Date: 01/02/2017  Reason for Consultation/Follow-up: Disposition, Establishing goals of care and Psychosocial/spiritual support  Subjective: Collin Young was in good spirits siting comfortably in his bedside chair. He continues to have a sore throat, and is feeling tired from not sleeping well overnight. He is otherwise without complaints. No family at bedside.   Length of Stay: 9  Current Medications: Scheduled Meds:  . atorvastatin  20 mg Oral Daily  . budesonide (PULMICORT) nebulizer solution  0.5 mg Nebulization BID  . carvedilol  6.25 mg Oral BID WC  . multivitamin with minerals  1 tablet Oral Daily  . sodium chloride flush  3 mL Intravenous Q12H    Continuous Infusions: . sodium chloride    . ceFEPime (MAXIPIME) IV 1 g (01/11/17 0959)  . feeding supplement (JEVITY 1.2 CAL) 1,000 mL (01/11/17 0104)  . fluconazole (DIFLUCAN) IV Stopped (01/10/17 1942)  . milrinone 0.25 mcg/kg/min (01/10/17 0948)  . nitroGLYCERIN 16.667 mcg/min (01/09/17 2135)    PRN Meds: sodium chloride, acetaminophen, guaiFENesin-dextromethorphan, ipratropium-albuterol, lip balm, MUSCLE RUB, ondansetron (ZOFRAN) IV, phenol, polyethylene glycol, polyvinyl alcohol, sodium chloride, sodium chloride flush, sodium chloride flush  Physical Exam  Constitutional: He is oriented to person, place, and time. He appears well-developed. No distress.  Appears older than stated age  HENT:  Head: Normocephalic and atraumatic.  Mouth/Throat: Oropharynx is clear and moist. No oropharyngeal exudate.  Cortrak in place  Eyes: EOM are normal.  Neck: Normal range of  motion. Neck supple.  Cardiovascular: Normal rate and regular rhythm.   Pulmonary/Chest: Tachypnea noted. He has rhonchi.  Abdominal: Soft. Bowel sounds are normal.  Musculoskeletal: Normal range of motion. He exhibits no edema.  Neurological: He is alert and oriented to person, place, and time.  Skin: Skin is warm and dry.  Psychiatric: He has a normal mood and affect. His behavior is normal. Judgment and thought content normal.            Vital Signs: BP 133/72 (BP Location: Right Arm)   Pulse 90   Temp 97.4 F (36.3 C) (Axillary)   Resp (!) 22   Ht _0  (1.676 m)   Wt 55.6 kg (122 lb 8 oz)   SpO2 98%   BMI 19.77 kg/m  SpO2: SpO2: 98 % O2 Device: O2 Device: Nasal Cannula O2 Flow Rate: O2 Flow Rate (L/min): 3.5 L/min  Intake/output summary:  Intake/Output Summary (Last 24 hours) at 01/11/17 1033 Last data filed at 01/11/17 0900  Gross per 24 hour  Intake                0 ml  Output              375 ml  Net             -375 ml   LBM: Last BM Date: 01/08/17 Baseline Weight: Weight: 54.4 kg (120 lb) Most recent weight: Weight: 55.6 kg (122 lb 8 oz)      Palliative Assessment/Data: PPS  50%   Flowsheet Rows     Most Recent Value  Intake Tab  Referral Department  Hospitalist  Unit at Time of Referral  Cardiac/Telemetry Unit  Palliative Care Primary Diagnosis  Cardiac  Date Notified  01/09/17  Palliative Care Type  New Palliative care  Reason for referral  Clarify Goals of Care  Date of Admission  01/02/17  Date first seen by Palliative Care  01/10/17  # of days Palliative referral response time  1 Day(s)  # of days IP prior to Palliative referral  7  Clinical Assessment  Palliative Performance Scale Score  30%  Pain Max last 24 hours  Not able to report  Pain Min Last 24 hours  Not able to report  Dyspnea Max Last 24 Hours  Not able to report  Dyspnea Min Last 24 hours  Not able to report  Nausea Max Last 24 Hours  Not able to report  Nausea Min Last 24 Hours   Not able to report  Anxiety Max Last 24 Hours  Not able to report  Anxiety Min Last 24 Hours  Not able to report  Other Max Last 24 Hours  Not able to report  Psychosocial & Spiritual Assessment  Palliative Care Outcomes  Patient/Family meeting held?  Yes  Who was at the meeting?  pt, his 2 daughters and niece  Palliative Care Outcomes  Clarified goals of care  Patient/Family wishes: Interventions discontinued/not started   Mechanical Ventilation, Trach  Palliative Care follow-up planned  Yes, Facility      Patient Active Problem List   Diagnosis Date Noted  . Dysphagia   . Palliative care by specialist   . Acute hypoxemic respiratory failure (Collin Young)   . Acute congestive heart failure (Collin Young)   . Ischemic cardiomyopathy   . Acute pulmonary edema ([facility])   . Congestive dilated cardiomyopathy (Collin Young)   . Acute on chronic combined systolic and diastolic CHF (congestive heart failure) (Collin Young) 01/02/2017  . AKI (acute kidney injury) (Collin Young) 01/02/2017  . Malignant neoplasm of pyriform sinus (Collin Young) 09/28/2013  . 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 NONSPECIFIC ABN FINDING RAD & OTH EXAM GI TRACT 05/14/2009    Palliative Care Assessment & Plan   HPI: 66 y.o. 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 Patient was admitted on 01/02/2017 with similar complaints. Echocardiogram revealed CHF with an ejection fraction of 10-15%. This is a new diagnosis for patient and his family. Since being hospitalized he underwent a heart catheterization on 01/05/2017 where he was found to have multivessel disease with a 99% occlusion of RCA moderate to left main Additionally patient was found to have pneumonia and per speech evaluation is aspirating all consistencies. Patient has a history  of esophageal cancer with chemotherapy as well as radiation  and dysphagia.   Assessment: I met Collin Young at his bedside. No family was present. Collin Young did the initial consult yesterday, which resulted in his code status being changed to DNR and the decision to proceed with a PEG tube if needed. The issue that had no yet been decided on was whether to proceed with further cardiac interventions (namely PCI of LAD), given the perceived benefits and possible risks.   Today, I met with Mr. Couzens at his bedside to further discuss the potential cardiac intervention. He was alert and full oriented,  and I believe fully decisional. He stated that he did wish to proceed with the cardiac procedure, he had discussed his wishes with his two daughters, and he accepted the risks. I queried his understanding of the risks, and he was able to articulate multiple risks, including the possibility of death. I then called his daughter, [name], who had a different perspective. She did discuss the possibility of doing the cardiac procedure with her father, but felt like they  (her and her sister) needed more information from Cardiology about the perceived benefit versus continuing with medical management. I did explain that her father had clearly articulated his desire for the procedure, however she emphasized that the decision could not be made until they had another conversation with cardiology.   Finally, I did discuss with both [name] and Mr. Parrow the plan for a repeat swallow evaluation today to determine if a feeding tube was necessary. They both agree with that plan, and want a PEG if he remains at a high aspiration risk.   Recommendations/Plan:  DNR  Cardiology updated on conversation with pt and daughter; when [name] and her sister arrive to the bedside today they plan to have the care nurse page Dr. Oval Linsey so they can have a final conversation about the cath  Of note, I do believe Mr. Zeiss is decisional, but he wavers between deferring to his daughters  for decision making and acting on his own accord. I would encourage consensus with him and his daughters before proceeding with any intervention  PT consult placed, I am concerned he may need SNF/rehab prior to transitioning home  Code Status:  DNR  Prognosis:   < 6 months  in the setting of congestive heart failure with an ejection fraction of 10-15%, dysphagia with frank aspiration, multi vessel cardiac disease with 99% occlusion to RCA and moderate occlusion to left main.  Discharge Planning:  Loop for rehab with Palliative care service follow-up  Care plan was discussed with pt, pt's daughter [name]), Cardiology  Thank you for allowing the Palliative Medicine Team to assist in the care of this patient.  Total time: 35 minutes    Greater than 50%  of this time was spent counseling and coordinating care related to the above assessment and plan.  Charlynn Court, NP Palliative Medicine Team [phone] pager (7a-5p) Team Phone # [phone]

## 2017-01-11 NOTE — Progress Notes (Signed)
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 He presented to the emergency department complaining of progressive shortness of breath for the past several weeks. He was admitted with concerns of CHF, with BNP significantly elevated at 2000. Echocardiogram revealed ejection fraction of 10-15% patient was started on beta blockers Lasix and Aldactone. Patient also on admission was found to be on acute kidney injury. Cardiology consulted, underwent cardiac cath showing significant multivessel disease. Recommended staged PCI when creatinine improves. Dysphagia was evaluated with recommendation per SLP for total NPO status, NG tube and reevaluation once clinical status improved. Blood cultures have been positive for MSSA and ID is making recommendations. Palliative care has been consulted and after family meeting, patient is DNR but would like to pursue PEG tube if indicated. They and cardiology are considering risks and benefits of revascularization. Plan is for PEG tube placement, monitoring of blood cultures. Repeat blood cultures from 5/12 following initiation of effective abx grew MSSA again in ~48 hrs. Repeat cultures were drawn 5/14. If these are positive, the patient may require pulling port and TEE. Eventually, PCI is planned.   Subjective: Patient feels better today, breathing much better, still with productive cough but no fever.   Assessment & Plan: Acute respiratory failure with hypoxia: Due to ischemic cardiomyopathy/acute systolic CHF, pulmonary edema, and likely aspiration pneumonitis/pneumonia. CXR show worsening air space disease, pneumonia vs pulm edema, he is afebrile and no  leukocytosis.  - Continue oxygen via nasal cannula as needed. Continue SDU level of care  - Continue nebs per pulmonology - Continue antibiotics as below - Sputum culture sent  Acute systolic heart failure: New diagnosis, due to multivessel CAD.  - NTG gtt for pulmonary edema. Holding lasix for AKI. ACE and aldactone on hold due to AKI   - Daily weight, I&O's low salt diet  - Milrinone per cardiology, hopeful to wean after LHC. Doubt he would do well at home with this.   Ischemic cardiomyopathy: Left main and LAD with moderate disease and chronically occluded LCx in addition to 99% stenosis of RCA thought to be amenable to atherectomy/staged PCI pending renal function.  - ASA, statin, plavix. - LHC planned per cardiology.    MSSA bacteremia: in 1 of 2 blood cultures from 5/10 and again on 5/12. PCT improving 10 > 2.  - ID recommending cefepime x7 days for aspiration, convert to ancef for total 14 days.  - Functional status insufficient for TEE. Could consider pulling port vs. empirically treating with long duration.  - Repeat blood cultures 5/14   AKI: Suspect cardiorenal syndrome vs from contrast vs diuretics. Poor CO leading to poor kidney perfusion. FENa 0.64% suggestive of prerenal etiology. - Monitor daily, resolved  Complex left renal cyst: Warrants additional surveillance with MRI or CT with contrast will defer after patient stable given patient will receive multiple contrast doses for cardiac cath. This can be follow as outpatient as well.   Enlarged prostate: Seen on U/S. No LUTS/BOO. PSA normal.  - No medications given lack of symptoms.   Hypertension: Per patient was taken off his BP meds by PCP PTA  - Continue coreg and NTG gtt as tolerated   Severe aspiration: Related to h/o radiation to esophagus,  MBSS 5/11 demonstrated frank aspiration of all consistencies.  - NPO - Cortrak placed, nutrition consulted.  - Patient wishes to proceed with PEG placement. Plavix/ticagrelor  would need to be held for this, so this is being pursued prior to PCI. I have consulted IR - Covering for respiratory anaerobes as above.   Squamous cell CA of the left piriform sinus and esophagus: In remission  - Continuing home fentanyl patch.   Hyponatremia: Suspect hypervolemia, improving, mild. - Monitor  DVT prophylaxis: Lovenox  Code Status: Full Family Communication: None at bedside this AM Disposition Plan: Ccontinue SDU level of care.    Consultants:   Cardiology   PCCM  ID   Palliative care  IR  Procedures:   ECHO 01/03/17 - Left ventricle: The cavity size was normal. Wall thickness was   normal. The estimated ejection fraction was in the range of 10%   to 15%. Diffuse hypokinesis. DIastolic function is abnormal,   indeterminant grade. - Aortic valve: There was moderate regurgitation. Valve area (VTI):   1.08 cm^2. Valve area (Vmax): 1.5 cm^2. Valve area (Vmean): 1.5   cm^2. - Mitral valve: There was moderate regurgitation. - Left atrium: The atrium was mildly dilated. - Right ventricle: Systolic function was mildly to moderately   reduced. - Tricuspid valve: There was moderate regurgitation. - Pulmonary arteries: Systolic pressure was moderately increased.   PA peak pressure: 49 mm Hg (S).  Right/Left Heart Cath and Coronary Angiography 01/05/2017     Prox RCA to Mid RCA lesion, 99 %stenosed.  Mid RCA lesion, 30 %stenosed.  Dist RCA lesion, 40 %stenosed.  RPDA lesion, 20 %stenosed.  Prox Cx lesion, 100 %stenosed.  Ost Ramus to Ramus lesion, 50 %stenosed.  Ost 3rd Diag to 3rd Diag lesion, 30 %stenosed.  Mid LAD lesion, 40 %stenosed.  Ost LAD to Prox LAD lesion, 20 %stenosed.  Ost LM to LM lesion, 40 %stenosed.  Hemodynamic findings consistent with mild pulmonary hypertension.  1. Severe calcific disease in the proximal to mid RCA 2. 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 Moderate left main and mid LAD stenosis 4.  Elevated filling pressures  Recommendations: He has multivessel CAD. I think the RCA can be approached with PCI but will likely require atherectomy prior to stenting. I would treat the remainder of his CAD medically. Will stage PCI given renal insufficiency. Hydrate gently tonight. May need diuresis tomorrow given elevated filling pressures. Would anticipate PCI on Thursday or Friday of this week if renal function remains stable.    Antimicrobials: Vanc/cefepime > cefepime 5/12.   Objective: Vitals:   01/10/17 2355 01/11/17 0100 01/11/17 0541 01/11/17 0800  BP: 138/77 129/74 133/72   Pulse: 88 88 90   Resp: (!) 23 (!) 23 (!) 22   Temp:  99.7 F (37.6 C) 98.2 F (36.8 C) 97.4 F (36.3 C)  TempSrc:  Oral Oral Axillary  SpO2: 100% 100% 98%   Weight:   55.6 kg (122 lb 8 oz)   Height:        Intake/Output Summary (Last 24 hours) at 01/11/17 1138 Last data filed at 01/11/17 0900  Gross per 24 hour  Intake                0 ml  Output              375 ml  Net             -375 ml   Filed Weights   01/09/17 0500  01/10/17 0514 01/11/17 0541  Weight: 55.2 kg (121 lb 11.2 oz) 54.7 kg (120 lb 8 oz) 55.6 kg (122 lb 8 oz)    Examination: General exam: Thin male in no distress. Hoarse voice, cortrak tube in place in R nare. Respiratory system: No accessory muscle use on 4L by [place]. Improved bilateral crackles (R > L) Cardiovascular system: Regular, no murmur, no JVD   Gastrointestinal system: Abd Soft NTND, +BS Extremities: No LE edema  Skin: No lesions or rash. Right upper chest port site c/d/i without erythema. Data Reviewed: I have personally reviewed following labs and imaging studies  CBC:  Recent Labs Lab 01/06/17 0423 01/08/17 0410 01/10/17 0500 01/11/17 0523  WBC 8.0 8.1 10.7* 12.6*  HGB 13.8 11.5* 11.5* 10.9*  HCT 42.0 34.8* 35.2* 33.3*  MCV 86.1 84.7 86.1 86.5  PLT 189 144* 174 010   Basic Metabolic Panel:  Recent Labs Lab 01/07/17 0214 01/07/17 2035  01/08/17 0410 01/09/17 0451 01/10/17 0500 01/11/17 0523  NA 127*  --  125* 128* 130* 133*  K 5.1  --  4.4 3.9 3.8 4.0  CL 89*  --  86* 88* 92* 96*  CO2 19*  --  27 32 33* 29  GLUCOSE 123*  --  91 122* 154* 127*  BUN 52*  --  66* 47* 30* 24*  CREATININE 2.01*  --  2.02* 1.33* 1.15 0.91  CALCIUM 8.5*  --  8.3* 8.0* 7.9* 7.4*  MG  --  2.5* 2.7*  --   --   --   PHOS  --  2.8 3.5  --   --   --    GFR: Estimated Creatinine Clearance: 62.8 mL/min (by C-G formula based on SCr of 0.91 mg/dL). Liver Function Tests: No results for input(s): AST, ALT, ALKPHOS, BILITOT, PROT, ALBUMIN in the last 168 hours. Recent Results (from the past 240 hour(s))  Culture, blood (Routine X 2) w Reflex to ID Panel     Status: None (Preliminary result)   Collection Time: 01/07/17  3:43 PM  Result Value Ref Range Status   Specimen Description BLOOD RIGHT ANTECUBITAL  Final   Special Requests IN PEDIATRIC BOTTLE Blood Culture adequate volume  Final   Culture NO GROWTH 3 DAYS  Final   Report Status PENDING  Incomplete  Culture, blood (Routine X 2) w Reflex to ID Panel     Status: Abnormal   Collection Time: 01/07/17  3:47 PM  Result Value Ref Range Status   Specimen Description BLOOD RIGHT HAND  Final   Special Requests IN PEDIATRIC BOTTLE Blood Culture adequate volume  Final   Culture  Setup Time   Final    GRAM POSITIVE COCCI IN CLUSTERS IN PEDIATRIC BOTTLE CRITICAL RESULT CALLED TO, READ BACK BY AND VERIFIED WITH: Leonie Green Pharm.D. 13:30 01/08/17 ([email or web address])    Culture STAPHYLOCOCCUS AUREUS (A)  Final   Report Status 01/10/2017 FINAL  Final   Organism ID, Bacteria STAPHYLOCOCCUS AUREUS  Final      Susceptibility   Staphylococcus aureus - MIC*    CIPROFLOXACIN <=0.5 SENSITIVE Sensitive     ERYTHROMYCIN <=0.25 SENSITIVE Sensitive     GENTAMICIN <=0.5 SENSITIVE Sensitive     OXACILLIN 0.5 SENSITIVE Sensitive     TETRACYCLINE <=1 SENSITIVE Sensitive     VANCOMYCIN <=0.5 SENSITIVE Sensitive      TRIMETH/SULFA <=10 SENSITIVE Sensitive     CLINDAMYCIN <=0.25 SENSITIVE Sensitive     RIFAMPIN <=0.5 SENSITIVE Sensitive     Inducible Clindamycin  NEGATIVE Sensitive     * STAPHYLOCOCCUS AUREUS  Blood Culture ID Panel (Reflexed)     Status: Abnormal   Collection Time: 01/07/17  3:47 PM  Result Value Ref Range Status   Enterococcus species NOT DETECTED NOT DETECTED Final   Listeria monocytogenes NOT DETECTED NOT DETECTED Final   Staphylococcus species DETECTED (A) NOT DETECTED Final    Comment: CRITICAL RESULT CALLED TO, READ BACK BY AND VERIFIED WITH: M. Vonita Moss.D. 13:30 01/08/17 ([email or web address])    Staphylococcus aureus DETECTED (A) NOT DETECTED Final    Comment: Methicillin (oxacillin) susceptible Staphylococcus aureus (MSSA). Preferred therapy is anti staphylococcal beta lactam antibiotic (Cefazolin or Nafcillin), unless clinically contraindicated. CRITICAL RESULT CALLED TO, READ BACK BY AND VERIFIED WITH: M. Radford Pax Pharm.D. 13:30 01/08/17 ([email or web address])    Methicillin resistance NOT DETECTED NOT DETECTED Final   Streptococcus species NOT DETECTED NOT DETECTED Final   Streptococcus agalactiae NOT DETECTED NOT DETECTED Final   Streptococcus pneumoniae NOT DETECTED NOT DETECTED Final   Streptococcus pyogenes NOT DETECTED NOT DETECTED Final   Acinetobacter baumannii NOT DETECTED NOT DETECTED Final   Enterobacteriaceae species NOT DETECTED NOT DETECTED Final   Enterobacter cloacae complex NOT DETECTED NOT DETECTED Final   Escherichia coli NOT DETECTED NOT DETECTED Final   Klebsiella oxytoca NOT DETECTED NOT DETECTED Final   Klebsiella pneumoniae NOT DETECTED NOT DETECTED Final   Proteus species NOT DETECTED NOT DETECTED Final   Serratia marcescens NOT DETECTED NOT DETECTED Final   Haemophilus influenzae NOT DETECTED NOT DETECTED Final   Neisseria meningitidis NOT DETECTED NOT DETECTED Final   Pseudomonas aeruginosa NOT DETECTED NOT DETECTED Final   Candida albicans NOT DETECTED NOT DETECTED  Final   Candida glabrata NOT DETECTED NOT DETECTED Final   Candida krusei NOT DETECTED NOT DETECTED Final   Candida parapsilosis NOT DETECTED NOT DETECTED Final   Candida tropicalis NOT DETECTED NOT DETECTED Final  MRSA PCR Screening     Status: None   Collection Time: 01/07/17  5:21 PM  Result Value Ref Range Status   MRSA by PCR NEGATIVE NEGATIVE Final    Comment:        The GeneXpert MRSA Assay (FDA approved for NASAL specimens only), is one component of a comprehensive MRSA colonization surveillance program. It is not intended to diagnose MRSA infection nor to guide or monitor treatment for MRSA infections.   Culture, blood (Routine X 2) w Reflex to ID Panel     Status: Abnormal (Preliminary result)   Collection Time: 01/09/17 10:15 AM  Result Value Ref Range Status   Specimen Description BLOOD RIGHT HAND  Final   Special Requests   Final    Blood Culture results may not be optimal due to an inadequate volume of blood received in culture bottles   Culture  Setup Time   Final    GRAM POSITIVE COCCI IN CLUSTERS Organism ID to follow IN PEDIATRIC BOTTLE CRITICAL RESULT CALLED TO, READ BACK BY AND VERIFIED WITH: N. JOHNSTON PHARMD, AT [place] 01/10/17 BY D. VANHOOK    Culture (A)  Final    STAPHYLOCOCCUS AUREUS SUSCEPTIBILITIES TO FOLLOW    Report Status PENDING  Incomplete  Blood Culture ID Panel (Reflexed)     Status: Abnormal   Collection Time: 01/09/17 10:15 AM  Result Value Ref Range Status   Enterococcus species NOT DETECTED NOT DETECTED Final   Listeria monocytogenes NOT DETECTED NOT DETECTED Final   Staphylococcus species DETECTED (A) NOT DETECTED Final    Comment: CRITICAL RESULT CALLED TO,  READ BACK BY AND VERIFIED WITH: V. JOHNSTON PHARMD, AT [place] 01/10/17 BY D. VANHOOK    Staphylococcus aureus DETECTED (A) NOT DETECTED Final    Comment: Methicillin (oxacillin) susceptible Staphylococcus aureus (MSSA). Preferred therapy is anti staphylococcal beta lactam  antibiotic (Cefazolin or Nafcillin), unless clinically contraindicated. CRITICAL RESULT CALLED TO, READ BACK BY AND VERIFIED WITH: V. JOHNSTON PHARMD, AT 0848 01/10/17 BY D. VANHOOK    Methicillin resistance NOT DETECTED NOT DETECTED Final   Streptococcus species NOT DETECTED NOT DETECTED Final   Streptococcus agalactiae NOT DETECTED NOT DETECTED Final   Streptococcus pneumoniae NOT DETECTED NOT DETECTED Final   Streptococcus pyogenes NOT DETECTED NOT DETECTED Final   Acinetobacter baumannii NOT DETECTED NOT DETECTED Final   Enterobacteriaceae species NOT DETECTED NOT DETECTED Final   Enterobacter cloacae complex NOT DETECTED NOT DETECTED Final   Escherichia coli NOT DETECTED NOT DETECTED Final   Klebsiella oxytoca NOT DETECTED NOT DETECTED Final   Klebsiella pneumoniae NOT DETECTED NOT DETECTED Final   Proteus species NOT DETECTED NOT DETECTED Final   Serratia marcescens NOT DETECTED NOT DETECTED Final   Haemophilus influenzae NOT DETECTED NOT DETECTED Final   Neisseria meningitidis NOT DETECTED NOT DETECTED Final   Pseudomonas aeruginosa NOT DETECTED NOT DETECTED Final   Candida albicans NOT DETECTED NOT DETECTED Final   Candida glabrata NOT DETECTED NOT DETECTED Final   Candida krusei NOT DETECTED NOT DETECTED Final   Candida parapsilosis NOT DETECTED NOT DETECTED Final   Candida tropicalis NOT DETECTED NOT DETECTED Final      Radiology Studies: Dg Chest Port 1 View  Result Date: 01/10/2017 CLINICAL DATA:  Acute respiratory failure with hypoxia EXAM: PORTABLE CHEST 1 VIEW COMPARISON:  01/09/2017 CXR FINDINGS: Lung volumes are slightly lower than on prior exam. Patchy consolidations are noted at each lung base and right upper lobe with probable small left effusion and mild interstitial edema. No change in port catheter tip position at the cavoatrial junction nor with respect to a feeding tube seen with tip in the expected location of the body of the stomach. The heart is obscured by  airspace opacities in left effusion. Aortic atherosclerosis at the arch. IMPRESSION: Persistent patchy bibasilar and right upper lobe airspace opacities likely to reflect changes of pneumonia and/or CHF. Low lung volumes with probable small left pleural effusion. Stable support line and tube positions. Electronically Signed   By: Ashley Royalty M.D.   On: 01/10/2017 23:02   Scheduled Meds: . aspirin EC  81 mg Oral Daily  . atorvastatin  20 mg Oral Daily  . budesonide (PULMICORT) nebulizer solution  0.5 mg Nebulization BID  . carvedilol  6.25 mg Oral BID WC  . multivitamin with minerals  1 tablet Oral Daily  . sodium chloride flush  3 mL Intravenous Q12H   Continuous Infusions: . sodium chloride    . ceFEPime (MAXIPIME) IV 1 g (01/11/17 0959)  . feeding supplement (JEVITY 1.2 CAL) 1,000 mL (01/11/17 0104)  . fluconazole (DIFLUCAN) IV Stopped (01/10/17 1942)  . milrinone 0.25 mcg/kg/min (01/10/17 0948)  . nitroGLYCERIN 16.667 mcg/min (01/09/17 2135)     LOS: 9 days   Vance Gather, MD Pager: Text Page via [email or web address]  [phone]   If 7PM-7AM, please contact night-coverage [email or web address] Password [facility] 01/11/2017, 11:38 AM

## 2017-01-11 NOTE — Progress Notes (Signed)
  Speech Language Pathology Treatment: Dysphagia  Patient Details Name: Collin Young MRN: [id number] DOB: Sep 30, 1950 Today's Date: 01/11/2017 Time: [id number] SLP Time Calculation (min) (ACUTE ONLY): 31 min  Assessment / Plan / Recommendation Clinical Impression  Guided pt in chin tuck against resistance for 90 reps, effortful swallow with ice chips after oral care for 15 repetitions. Pt did not complete exercises independently over the weekend per his report, did not understand this plan.  Discussed severity of chronic dysphagia with pt and daughter and need for high frequency therapy and home exercise plan for any potential for recovery of pharyngeal mobility. Daughter verbalized understanding.   Per several providers note, pt was expected to have a repeat swallow test today. There is no expectation for improved swallow function over three days of NPO status, particularly given that pt has not completed exercises. Pt may have some potential for minimal spontaneous recovery after pneumonia has improved and he has had access to improved nutrition from tube feeds. Risk of aspiration and pts strength will continue to fluctuate with any sedation for cardiac procedures as well, so it is beneficial to await until procedures are completed and d/c is pending before repeating an MBS. If team favors PEG placement before cardiac procedure is completed, it is recommended to proceed given pts plan of care. Discussed with pt and his daughter.    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 CXR significant for pulmonary edema. Treated with IV lasix. Cardiology was consulted and patient underwent right heart cath on 5/18 in which revealed multivessel CAD with  plans to do PCI of RCA next week if renal function is stable. On 5/10 patient became tachypneic and hypoxic, given 40 mg IV lasix with 357ml output, with improvement. CXR shows worsening airspace opacities compatible with PNA or asymmetric pulmonary edema, mild cardiomegaly without vascular congestion. Pt was seen in OP SLP tx over 11 months, 4 visits. Pt reported that swallowing issues had resolved, but was  noncompliant with exercise program. Pt had G tube during RXT, it was removed in 2015.  In early 2016 SLP reported consistent throat clearing/cough observed with thin liquids, small sip chin tuck and effortful swallow x2 eliminated cough. No MBS or FEES on record.       SLP Plan  Continue with current plan of care       Recommendations  Diet recommendations: NPO Liquids provided via: Teaspoon Medication Administration: Via alternative means Supervision: Patient able to self feed Compensations: Effortful swallow                Plan: Continue with current plan of care       [place] Collin Moxey, MA CCC-SLP [phone]  Lynann Beaver 01/11/2017, 3:08 PM

## 2017-01-11 NOTE — Progress Notes (Signed)
Pharmacy Antibiotic Note  ANTWANE GROSE is a 66 y.o. male with concern of PNA. Pharmacy has been consulted for cefepime dosing. CXR with worsening opacities compatible with PNA vs. pulmonary edema. Blood cultures show MSSA bacteremia as well. ID is following and recommends 7 days of cefepime for HCAP treatment, then switch to Ancef for a total of 14 days or longer for conservative treatment. Patient is afebrile, WBC up to 12.6, and renal function back to normal. Decision for TEE and removing port +/- PICC pending.  Plan: - Increase cefepime to 1 g IV q8h - through 5/16 - Begin cefazolin 1 g IV q8h on 5/17 - Pharmacy will sign off but follow peripherally for antibiotic dose adjustments through clinical decision support - Consider OPAT consult once LOT known     Height: 5\' 6"  (167.6 cm) Weight: 122 lb 8 oz (55.6 kg) IBW/kg (Calculated) : 63.8  Temp (24hrs), Avg:98.3 F (36.8 C), Min:97.4 F (36.3 C), Max:99.7 F (37.6 C)   Recent Labs Lab 01/06/17 0423 01/07/17 0214 01/08/17 0410 01/09/17 0451 01/10/17 0500 01/11/17 0523  WBC 8.0  --  8.1  --  10.7* 12.6*  CREATININE 1.72* 2.01* 2.02* 1.33* 1.15 0.91    Estimated Creatinine Clearance: 62.8 mL/min (by C-G formula based on SCr of 0.91 mg/dL).    No Known Allergies  Antimicrobials this admission: 5/10 vanc>>5/12 5/10 cefepime>> 5/11 fluconazole>>  Microbiology results: 5/10 Blood cx >> MSSA 5/10 MRSA pcr - negative 5/12 Blood cx - MSSA, BCID with MSSA    Renold Genta, PharmD, BCPS Clinical Pharmacist Phone for today - [place] - [phone] 01/11/2017 12:01 PM

## 2017-01-11 NOTE — Progress Notes (Signed)
Progress Note  Patient Name: Collin Young Date of Encounter: 01/11/2017  Primary Cardiologist: Collin Young)  Subjective   Feeling well.  Denies chest pain or shortness of breath.  Wants to go ahead with cath.    Inpatient Medications    Scheduled Meds: . atorvastatin  20 mg Oral Daily  . budesonide (PULMICORT) nebulizer solution  0.5 mg Nebulization BID  . carvedilol  6.25 mg Oral BID WC  . multivitamin with minerals  1 tablet Oral Daily  . sodium chloride flush  3 mL Intravenous Q12H   Continuous Infusions: . sodium chloride    . ceFEPime (MAXIPIME) IV 1 g (01/11/17 0959)  . feeding supplement (JEVITY 1.2 CAL) 1,000 mL (01/11/17 0104)  . fluconazole (DIFLUCAN) IV Stopped (01/10/17 1942)  . milrinone 0.25 mcg/kg/min (01/10/17 0948)  . nitroGLYCERIN 16.667 mcg/min (01/09/17 2135)   PRN Meds: sodium chloride, acetaminophen, guaiFENesin-dextromethorphan, ipratropium-albuterol, lip balm, MUSCLE RUB, ondansetron (ZOFRAN) IV, phenol, polyethylene glycol, polyvinyl alcohol, sodium chloride, sodium chloride flush, sodium chloride flush   Vital Signs    Vitals:   01/10/17 2355 01/11/17 0100 01/11/17 0541 01/11/17 0800  BP: 138/77 129/74 133/72   Pulse: 88 88 90   Resp: (!) 23 (!) 23 (!) 22   Temp:  99.7 F (37.6 C) 98.2 F (36.8 C) 97.4 F (36.3 C)  TempSrc:  Oral Oral Axillary  SpO2: 100% 100% 98%   Weight:   55.6 kg (122 lb 8 oz)   Height:        Intake/Output Summary (Last 24 hours) at 01/11/17 1019 Last data filed at 01/11/17 0900  Gross per 24 hour  Intake                0 ml  Output              875 ml  Net             -875 ml   Filed Weights   01/09/17 0500 01/10/17 0514 01/11/17 0541  Weight: 55.2 kg (121 lb 11.2 oz) 54.7 kg (120 lb 8 oz) 55.6 kg (122 lb 8 oz)    Telemetry    Sinus rhythm.  PVCs - Personally Reviewed  ECG    n/a - Personally Reviewed  Physical Exam   GEN: Collin Young, elderly man in Nno acute distress.   Neck: No JVD  Cardiac:  RRR, no murmurs, rubs, or gallops.  Respiratory: R sided rhonchi.  No crackles or wheezes. GI: Soft, nontender, non-distended  MS: No edema; No deformity. Neuro:  Nonfocal  Psych: Normal affect   Labs    Chemistry Recent Labs Lab 01/09/17 0451 01/10/17 0500 01/11/17 0523  NA 128* 130* 133*  K 3.9 3.8 4.0  CL 88* 92* 96*  CO2 32 33* 29  GLUCOSE 122* 154* 127*  BUN 47* 30* 24*  CREATININE 1.33* 1.15 0.91  CALCIUM 8.0* 7.9* 7.4*  GFRNONAA 54* >60 >60  GFRAA >60 >60 >60  ANIONGAP 8 5 8      Hematology Recent Labs Lab 01/08/17 0410 01/10/17 0500 01/11/17 0523  WBC 8.1 10.7* 12.6*  RBC 4.11* 4.09* 3.85*  HGB 11.5* 11.5* 10.9*  HCT 34.8* 35.2* 33.3*  MCV 84.7 86.1 86.5  MCH 28.0 28.1 28.3  MCHC 33.0 32.7 32.7  RDW 13.7 13.7 14.1  PLT 144* 174 172    Cardiac EnzymesNo results for input(s): TROPONINI in the last 168 hours. No results for input(s): TROPIPOC in the last 168 hours.   BNP Recent  Labs Lab 01/05/17 0625  BNP 2,182.6*     DDimer No results for input(s): DDIMER in the last 168 hours.   Radiology    Dg Chest Port 1 View  Result Date: 01/10/2017 CLINICAL DATA:  Acute respiratory failure with hypoxia EXAM: PORTABLE CHEST 1 VIEW COMPARISON:  01/09/2017 CXR FINDINGS: Lung volumes are slightly lower than on prior exam. Patchy consolidations are noted at each lung base and right upper lobe with probable small left effusion and mild interstitial edema. No change in port catheter tip position at the cavoatrial junction nor with respect to a feeding tube seen with tip in the expected location of the body of the stomach. The heart is obscured by airspace opacities in left effusion. Aortic atherosclerosis at the arch. IMPRESSION: Persistent patchy bibasilar and right upper lobe airspace opacities likely to reflect changes of pneumonia and/or CHF. Low lung volumes with probable small left pleural effusion. Stable support line and tube positions. Electronically Signed    By: Collin Young M.D.   On: 01/10/2017 23:02    Cardiac Studies   Echo 01/03/17: Study Conclusions  - Left ventricle: The cavity size was normal. Wall thickness was   normal. The estimated ejection fraction was in the range of 10%   to 15%. Diffuse hypokinesis. DIastolic function is abnormal,   indeterminant grade. - Aortic valve: There was moderate regurgitation. Valve area (VTI):   1.08 cm^2. Valve area (Vmax): 1.5 cm^2. Valve area (Vmean): 1.5   cm^2. - Mitral valve: There was moderate regurgitation. - Left atrium: The atrium was mildly dilated. - Right ventricle: Systolic function was mildly to moderately   reduced. - Tricuspid valve: There was moderate regurgitation. - Pulmonary arteries: Systolic pressure was moderately increased.   PA peak pressure: 49 mm Hg (S).  LHC/RHC 01/05/17: Prox RCA to Mid RCA lesion, 99 %stenosed.  Mid RCA lesion, 30 %stenosed.  Dist RCA lesion, 40 %stenosed.  RPDA lesion, 20 %stenosed.  Prox Cx lesion, 100 %stenosed.  Ost Ramus to Ramus lesion, 50 %stenosed.  Ost 3rd Diag to 3rd Diag lesion, 30 %stenosed.  Mid LAD lesion, 40 %stenosed.  Ost LAD to Prox LAD lesion, 20 %stenosed.  Ost LM to LM lesion, 40 %stenosed.  Hemodynamic findings consistent with mild pulmonary hypertension.   1. Severe calcific disease in the proximal to mid RCA 2. 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 Moderate left main and mid LAD stenosis 4. Elevated filling pressures   Patient Profile     66 y.o. 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  Found to have severe 3 vessel CAD on cath.  Hospitalization complicated by aspiration pneumonia and Collin Young bacteremia.  Assessment & Plan    # Acute systolic and diastolic heart failure:  Continue milrinone and carvedilol.   It is unclear that home milrinone will be an option.  Will plan to wean after LHC. Hopefully transition to an ACE-I/ARB.  # Multivessel CAD: [name] is too weak for CABG.  We discussed the risks and benefits of cath.  He wishes to proceed with PCI of the LAD.  This can occur as soon as tomorrow.  If he will get a PEG and plavix/ticagrelor would need to be held for that procedure, would get the PEG first.  For unclear reasons he is not on aspirin.  Will start aspirin 81mg   daily today.  Continue atorvastatin and clopidogrel.   # Collin Young bacteremia: If TEE will help decide whether or not to pull his PICC (ie-if he would be deemed a candidate for this), then TEE is reasonable.  He certainly wouldn't be a surgical candidate.  Would favor conservative therapy with empiric antibiotics.  However, if ID feels differently, we can arrange for TEE.   Signed, Skeet Latch, MD  01/11/2017, 10:19 AM

## 2017-01-11 NOTE — Care Management Important Message (Signed)
Important Message  Patient Details  Name: Collin Young MRN: [id number] Date of Birth: [birth year]   Medicare Important Message Given:  Yes    [name] May 01/11/2017, 1:54 PM

## 2017-01-11 NOTE — Consult Note (Signed)
Chief Complaint: Patient was seen in consultation today for esophagitis  Referring Physician(s):  Dr. Vance Gather  Supervising Physician: Marybelle Killings  Patient Status: [facility] - In-pt  History of Present Illness: Collin Young is a 66 y.o. male with past medical history of HTN, HLD, GERD, and esophageal cancer s/p chemo and radiation therapy in 2015. Patient had a PEG placed during this cancer treatment, however ultimately was able to have it removed once treatment stopped.   Patient was admitted 01/02/17 with shortness of breath and was found to have new onset CHF.  Cardiology has been following for what was determined to be multivessel disease after RHC/LHC.  He was also found to have severe malnutrition with severe dysphagia requiring NG placement and nutrition via NG only. Patient has been followed by Palliative Care team who have been working to establish goals of care with patient and family.   IR consulted for possible gastrostomy tube placement in the setting of chronic illness, severe malnutrition, and deconditioning.  No recent abdominal imaging available for review.   Past Medical History:  Diagnosis Date  . Cancer [facility])    Esophagus- radiation 2011  . GERD (gastroesophageal reflux disease)    takes zantac prn  . Headache   . History of radiation therapy 10/19/2013-12/05/2013   pyriform sinus 69.96 Gy/61f  . HOH (hard of hearing)   . Hyperlipemia   . Hypertension   [place]    [place]- [place]    Past Surgical History:  Procedure Laterality Date  . CARDIOVASCULAR STRESS TEST  10/09/09   normal nuclearr stress test, EF 57% ([name]  . LARYNGOSCOPY N/A 09/15/2013   Procedure: LARYNGOSCOPY;  Surgeon: DMelida Quitter MD;  [place] [facility]  Service: ENT;  Laterality: N/A;  direct laryngoscopy with biopsy and esophagoscopy  . NO PAST SURGERIES    . RIGHT/LEFT HEART CATH AND CORONARY ANGIOGRAPHY N/A 01/05/2017   Procedure: Right/Left Heart Cath and Coronary  Angiography;  Surgeon: MBurnell Blanks MD;  [place];  Service: Cardiovascular;  Laterality: N/A;    Allergies: Patient has no known allergies.  Medications: Prior to Admission medications   Medication Sig Start Date End Date Taking? Authorizing Provider  atorvastatin (LIPITOR) 20 MG tablet Take 20 mg by mouth daily. 12/30/16  Yes [provider]  fentaNYL (DURAGESIC - DOSED MCG/HR) 12 MCG/HR Place 1 patch every 3 days to the skin 12/22/16  Yes Ennever, PRudell Cobb MD  Multiple Vitamin (MULTIVITAMIN WITH MINERALS) TABS tablet Take 1 tablet by mouth daily.   Yes [provider]     History reviewed. No pertinent family history.  Social History   Social History  . Marital status: Legally Separated    Spouse name: N/A  . Number of children: N/A  . Years of education: N/A   Social History Main Topics  . Smoking status: Former Smoker    Packs/day: 1.00    Years: 40.00    Types: Cigarettes    Start date: 11/08/1960    Quit date: 09/01/1999  . Smokeless tobacco: Former USystems developer   [place]     Comment: quit in 2011  . Alcohol use No     Comment: quit in 2001  . Drug use: No     Comment: back in the day used cocaine,alcohol, marijuana  . Sexual activity: Not Asked   Other Topics Concern  . None   Social History Narrative  .  None    Review of Systems  HENT: Positive for trouble swallowing.   Respiratory: Negative for cough and shortness of breath.   Cardiovascular: Negative for chest pain.  Gastrointestinal: Negative for abdominal pain.  Psychiatric/Behavioral: Negative for behavioral problems and confusion.    Vital Signs: BP 133/72 (BP Location: Right Arm)   Pulse 90   Temp 97.4 F (36.3 C) (Axillary)   Resp (!) 22   Ht '5\' 6"'  (1.676 m)   Wt 122 lb 8 oz (55.6 kg)   SpO2 98%   BMI 19.77 kg/m   Physical Exam  Constitutional: He is oriented to person, place, and time.  Cardiovascular: Normal rate  and regular rhythm.   Pulmonary/Chest: Effort normal. No respiratory distress.  Abdominal: Soft.  Neurological: He is alert and oriented to person, place, and time.  Skin: Skin is warm and dry.  Psychiatric: He has a normal mood and affect. His behavior is normal. Judgment and thought content normal.  Nursing note and vitals reviewed.   Mallampati Score:  MD Evaluation Airway: WNL Heart: WNL Abdomen: WNL Chest/ Lungs: WNL ASA  Classification: 3 Mallampati/Airway Score: Three  Imaging: Dg Chest 2 View  Result Date: 01/07/2017 CLINICAL DATA:  Street shortness of breath, follow-up from chest x-ray of Jan 02, 2017 at which time pulmonary edema or less likely pneumonia was suspected. EXAM: CHEST  2 VIEW COMPARISON:  PA and lateral chest x-ray of Jan 02, 2017 FINDINGS: The lungs are adequately inflated. There are increased confluent alveolar opacities in the right mid and lower lung and in the left mid and lower lung. The interstitial markings are mildly increased. The cardiac silhouette remains enlarged. There is no significant left pleural effusion. There is a small right pleural effusion. There is calcification in the wall of the aortic arch. The power port catheter tip projects over the junction of the middle and distal thirds of the SVC. IMPRESSION: Worsening of airspace opacities compatible with pneumonia or asymmetric pulmonary edema though I favor the former. Mild cardiomegaly without pulmonary vascular congestion. Thoracic aortic atherosclerosis. Electronically Signed   By: David  Martinique M.D.   On: 01/07/2017 14:06   Dg Chest 2 View  Result Date: 01/02/2017 CLINICAL DATA:  Dyspnea, onset yesterday and worsening. EXAM: CHEST  2 VIEW COMPARISON:  10/09/2015 FINDINGS: Right-sided port extends into the low SVC. There are pleural effusions bilaterally, new. There is mild interstitial fluid and vascular prominence. Central and basilar airspace opacities are present. IMPRESSION: Interstitial and  alveolar edema. New small pleural effusions. Cannot exclude infectious infiltrates. Electronically Signed   By: Andreas Newport M.D.   On: 01/02/2017 06:39   Dg Abd 1 View  Result Date: 01/08/2017 CLINICAL DATA:  Feeding tube placement. EXAM: ABDOMEN - 1 VIEW COMPARISON:  12/03/2013 FINDINGS: Enteric feeding tube tip projects in the stomach. There is residual contrast within the stomach, also within the small bowel and colon. Interstitial hazy airspace opacities are noted in the visualized lower lungs, with evidence of small pleural effusions. IMPRESSION: 1. Enteric feeding tube tip projects in the mid stomach. Electronically Signed   By: Lajean Manes M.D.   On: 01/08/2017 15:47   US Renal  Result Date: 01/04/2017 CLINICAL DATA:  Acute renal insufficiency.  Hypertension. EXAM: RENAL / URINARY TRACT ULTRASOUND COMPLETE COMPARISON:  None. FINDINGS: Right Kidney: Length: 10.1 cm. Echogenicity is increased. Renal cortical thickness is normal. No mass, perinephric fluid, or hydronephrosis visualized. No sonographically demonstrable calculus or ureterectasis. Left Kidney: Length: 10.8 cm. Echogenicity is  slightly increased. Renal cortical thickness is normal. No perinephric fluid or hydronephrosis visualized. There is a predominantly cystic mass arising from the mid left kidney measuring 3.6 x 3.0 x 3.0 cm. There is a focal area of increased echogenicity along the periphery of this cystic lesion. There is an extrarenal pelvis on the left. No sonographically demonstrable calculus or ureterectasis. Bladder: Appears normal for degree of bladder distention. Prostate measures 5 x 3 cm. Prostate is mildly inhomogeneous in appearance. There is a right pleural effusion. IMPRESSION: Each kidney is echogenic, a finding that may be seen with medical renal disease. There is no hydronephrosis on either side. There is an extrarenal pelvis on the left. Complex left cystic mass within an echogenic focus along the periphery of  this mass. This mass warrants additional surveillance given the echogenic focus along the periphery. Further evaluation with pre and post contrast MRI or CT should be considered. MRI is preferred in younger patients (due to lack of ionizing radiation) and for evaluating calcified lesion(s). Prostate mildly prominent and mildly inhomogeneous in appearance. This finding warrants correlation with PSA and clinical examination. Right pleural effusion. Electronically Signed   By: Lowella Grip III M.D.   On: 01/04/2017 16:03   Dg Chest Port 1 View  Result Date: 01/10/2017 CLINICAL DATA:  Acute respiratory failure with hypoxia EXAM: PORTABLE CHEST 1 VIEW COMPARISON:  01/09/2017 CXR FINDINGS: Lung volumes are slightly lower than on prior exam. Patchy consolidations are noted at each lung base and right upper lobe with probable small left effusion and mild interstitial edema. No change in port catheter tip position at the cavoatrial junction nor with respect to a feeding tube seen with tip in the expected location of the body of the stomach. The heart is obscured by airspace opacities in left effusion. Aortic atherosclerosis at the arch. IMPRESSION: Persistent patchy bibasilar and right upper lobe airspace opacities likely to reflect changes of pneumonia and/or CHF. Low lung volumes with probable small left pleural effusion. Stable support line and tube positions. Electronically Signed   By: Ashley Royalty M.D.   On: 01/10/2017 23:02   Dg Chest Port 1 View  Result Date: 01/09/2017 CLINICAL DATA:  Respiratory failure EXAM: PORTABLE CHEST 1 VIEW COMPARISON:  01/08/2017 FINDINGS: Progression of bibasilar airspace disease, possible pneumonia. Small pleural effusions. Interval placement of feeding tube in the stomach. Port-A-Cath tip in the SVC unchanged. IMPRESSION: Progression of bibasilar infiltrates, possible pneumonia or edema. Feeding tube in the stomach. Electronically Signed   By: Franchot Gallo M.D.   On:  01/09/2017 08:19   Dg Chest Port 1 View  Result Date: 01/08/2017 CLINICAL DATA:  Respiratory failure.  Esophageal cancer. EXAM: PORTABLE CHEST 1 VIEW COMPARISON:  Yesterday FINDINGS: Bilateral central and basilar airspace disease is not significantly changed allowing for differences in patient position. Heart is upper normal in size. Right jugular Port-A-Cath is stable. No pneumothorax. IMPRESSION: Stable bilateral airspace disease in a pulmonary edema or ARDS pattern. Electronically Signed   By: Marybelle Killings M.D.   On: 01/08/2017 07:45   Dg Swallowing Func-speech Pathology  Result Date: 01/08/2017 Objective Swallowing Evaluation: Type of Study: MBS-Modified Barium Swallow Study Patient Details Name: MASIYAH ENGEN MRN: [id number] Date of Birth: Aug 11, 1951 Today's Date: 01/08/2017 Time: SLP Start Time (ACUTE ONLY): 1000-SLP Stop Time (ACUTE ONLY): 1030 SLP Time Calculation (min) (ACUTE ONLY): 30 min Past Medical History: Past Medical History: Diagnosis Date . Cancer [facility])   Esophagus- radiation 2011 . GERD (gastroesophageal reflux disease)  takes zantac prn . Headache  . History of radiation therapy 10/19/2013-12/05/2013  pyriform sinus 69.96 Gy/24f . HOH (hard of hearing)  . Hyperlipemia  . Hypertension  . [place]   alcohol withdrawls- 2001 Past Surgical History: Past Surgical History: Procedure Laterality Date . CARDIOVASCULAR STRESS TEST  10/09/09  normal nuclearr stress test, EF 57% ([name] . LARYNGOSCOPY N/A 09/15/2013  Procedure: LARYNGOSCOPY;  Surgeon: DMelida Quitter MD;  [place] [facility]  Service: ENT;  Laterality: N/A;  direct laryngoscopy with biopsy and esophagoscopy . NO PAST SURGERIES   . 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 CXR significant for pulmonary edema. Treated with IV lasix. Cardiology was consulted and patient underwent right heart cath on 5/18 in which revealed multivessel CAD with plans to do PCI of RCA next week if renal function is stable. On 5/10 patient became tachypneic and hypoxic, given 40 mg IV lasix with 3075moutput, with improvement. CXR shows worsening airspace opacities compatible with PNA or asymmetric pulmonary edema, mild cardiomegaly without vascular congestion. Pt was seen in OP SLP tx over 11 months, 4 visits. Pt reported that swallowing issues had resolved, but was  noncompliant with exercise program. Pt had G tube during RXT, it was removed in 2015.  In early 2016 SLP reported consistent throat clearing/cough observed with thin liquids, small sip chin tuck and effortful swallow x2 eliminated cough. No MBS or FEES on record.  No Data Recorded Assessment / Plan / Recommendation CHL IP CLINICAL IMPRESSIONS 01/08/2017 Clinical Impression Pt demonstrates severe to profound oropharyngeal dysphagia secondary to fibrotic musculature following RXT in 2015 without compliance to recommendations for exercise program. Due to severe lack of mobility of hyolaryngeal complex and pharyngeal musculature there is minimal ability to protect airway during the swallow leading to immediate severe penetration to the cords before/during the swallow. Pt does appear to achieve moderate glottic closure to hold penetrates in the vestibule, but as soon as he releases the swallow, they spill into trachea. There is absent sensation from pt. In addition, due to lack of pharyngeal peristalsis and hyolaryngeal excursion, at least 50% of the bolus remains pooled in the vallecula and pyriforms post swallow, with  increased severity with thick liquids. Attempted all positional compensations without success. Best strategy was a super supraglottic swallow to capitilaze on glottic closure and expectoration of penetrate. Even with this strategy, severe aspiration occurs before and after attempts. Pt verbalizes desire to continue all treatments to prolong life and would not want to accept risk of aspiration. Given dx of pna and need for further medical interventions, aggressive prevention of aspiration of PO is recommended. Suggest short term alternate means of nutrition until cardiac procedure is complete and pts respiratory status is somewhat improved.  Recommend ice chips after oral care only at this time.  Will plan to retest next week, but expect poor spontaneous recovery or potential to improve with therapy over the short term given length of time since RXT and severity of impairment.  SLP Visit Diagnosis Dysphagia, oropharyngeal phase (R13.12) Attention and concentration  deficit following -- Frontal lobe and executive function deficit following -- Impact on safety and function Severe aspiration risk;Risk for inadequate nutrition/hydration   CHL IP TREATMENT RECOMMENDATION 01/08/2017 Treatment Recommendations Therapy as outlined in treatment plan below   Prognosis 01/08/2017 Prognosis for Safe Diet Advancement Guarded Barriers to Reach Goals Severity of deficits;Time post onset Barriers/Prognosis Comment -- CHL IP DIET RECOMMENDATION 01/08/2017 SLP Diet Recommendations NPO;Alternative means - temporary;Ice chips PRN after oral care Liquid Administration via -- Medication Administration Via alternative means Compensations -- Postural Changes --   CHL IP OTHER RECOMMENDATIONS 01/08/2017 Recommended Consults -- Oral Care Recommendations Oral care before and after PO Other Recommendations --   CHL IP FOLLOW UP RECOMMENDATIONS 01/08/2017 Follow up Recommendations Skilled Nursing facility   [facility] IP FREQUENCY AND DURATION 01/08/2017  Speech Therapy Frequency (ACUTE ONLY) min 2x/week Treatment Duration 2 weeks      CHL IP ORAL PHASE 01/08/2017 Oral Phase Impaired Oral - Pudding Teaspoon -- Oral - Pudding Cup -- Oral - Honey Teaspoon -- Oral - Honey Cup -- Oral - Nectar Teaspoon -- Oral - Nectar Cup -- Oral - Nectar Straw -- Oral - Thin Teaspoon -- Oral - Thin Cup -- Oral - Thin Straw -- Oral - Puree -- Oral - Mech Soft -- Oral - Regular -- Oral - Multi-Consistency -- Oral - Pill -- Oral Phase - Comment --  CHL IP PHARYNGEAL PHASE 01/08/2017 Pharyngeal Phase Impaired Pharyngeal- Pudding Teaspoon -- Pharyngeal -- Pharyngeal- Pudding Cup -- Pharyngeal -- Pharyngeal- Honey Teaspoon Delayed swallow initiation-pyriform sinuses;Reduced pharyngeal peristalsis;Reduced epiglottic inversion;Reduced anterior laryngeal mobility;Reduced laryngeal elevation;Reduced airway/laryngeal closure;Reduced tongue base retraction;Penetration/Aspiration before swallow;Penetration/Aspiration during swallow;Penetration/Apiration after swallow;Significant aspiration (Amount);Pharyngeal residue - valleculae;Pharyngeal residue - pyriform Pharyngeal -- Pharyngeal- Honey Cup -- Pharyngeal -- Pharyngeal- Nectar Teaspoon -- Pharyngeal -- Pharyngeal- Nectar Cup Delayed swallow initiation-pyriform sinuses;Reduced pharyngeal peristalsis;Reduced epiglottic inversion;Reduced anterior laryngeal mobility;Reduced laryngeal elevation;Reduced airway/laryngeal closure;Reduced tongue base retraction;Penetration/Aspiration before swallow;Penetration/Aspiration during swallow;Penetration/Apiration after swallow;Significant aspiration (Amount);Pharyngeal residue - valleculae;Pharyngeal residue - pyriform Pharyngeal -- Pharyngeal- Nectar Straw -- Pharyngeal -- Pharyngeal- Thin Teaspoon -- Pharyngeal -- Pharyngeal- Thin Cup Delayed swallow initiation-pyriform sinuses;Reduced pharyngeal peristalsis;Reduced epiglottic inversion;Reduced anterior laryngeal mobility;Reduced laryngeal elevation;Reduced  airway/laryngeal closure;Reduced tongue base retraction;Penetration/Aspiration before swallow;Penetration/Aspiration during swallow;Penetration/Apiration after swallow;Significant aspiration (Amount);Pharyngeal residue - valleculae;Pharyngeal residue - pyriform Pharyngeal -- Pharyngeal- Thin Straw -- Pharyngeal -- Pharyngeal- Puree Reduced pharyngeal peristalsis;Reduced epiglottic inversion;Reduced anterior laryngeal mobility;Reduced laryngeal elevation;Reduced airway/laryngeal closure;Reduced tongue base retraction;Penetration/Apiration after swallow;Significant aspiration (Amount);Pharyngeal residue - valleculae;Pharyngeal residue - pyriform;Delayed swallow initiation-vallecula Pharyngeal -- Pharyngeal- Mechanical Soft -- Pharyngeal -- Pharyngeal- Regular -- Pharyngeal -- Pharyngeal- Multi-consistency -- Pharyngeal -- Pharyngeal- Pill -- Pharyngeal -- Pharyngeal Comment --  CHL IP CERVICAL ESOPHAGEAL PHASE 01/08/2017 Cervical Esophageal Phase Impaired Pudding Teaspoon -- Pudding Cup -- Honey Teaspoon -- Honey Cup -- Nectar Teaspoon -- Nectar Cup -- Nectar Straw -- Thin Teaspoon -- Thin Cup -- Thin Straw -- Puree -- Mechanical Soft -- Regular -- Multi-consistency -- Pill -- Cervical Esophageal Comment reduced CP opening No flowsheet data found. Herbie Baltimore, MA CCC-SLP [phone] Lynann Beaver 01/08/2017, 10:54 AM               Labs:  CBC:  Recent Labs  01/06/17 0423 01/08/17 0410 01/10/17 0500 01/11/17 0523  WBC 8.0 8.1 10.7* 12.6*  HGB 13.8 11.5* 11.5* 10.9*  HCT 42.0 34.8* 35.2* 33.3*  PLT 189 144* 174 172    COAGS:  Recent Labs  01/04/17 0511  INR 1.03    BMP:  Recent Labs  01/08/17 0410 01/09/17 0451 01/10/17 0500 01/11/17 0523  NA 125* 128* 130* 133*  K 4.4 3.9 3.8 4.0  CL 86* 88* 92* 96*  CO2 27 32 33* 29  GLUCOSE 91 122* 154* 127*  BUN 66* 47* 30* 24*  CALCIUM 8.3* 8.0* 7.9* 7.4*  CREATININE 2.02* 1.33* 1.15 0.91  GFRNONAA 33* 54* >60 >60  GFRAA 38* >60  >60 >60    LIVER FUNCTION TESTS:  Recent Labs  03/05/16 1334 01/02/17 0658  BILITOT <0.30 0.4  AST 26 39  ALT 20 25  ALKPHOS 42 37*  PROT 7.4 7.1  ALBUMIN 3.7 3.9    TUMOR MARKERS: No results for input(s): AFPTM, CEA, CA199, CHROMGRNA in the last 8760 hours.  Assessment and Plan: Dysphagia, severe malnutrition in context of chronic illness Patient with history of esophagitis from chemo/radiation and prior PEG placement since removed, now with oropharyngeal dysphagia leading to poor PO intake and inability to maintain weight.  IR consulted for possible gastrostomy tube placement.  Patient assessed by SLP and RD 01/08/17 and was started on tube feeds via NG.  Plan for repeat SLP assessment today.  Patient with no recent abdominal imaging.  Abd CT scan ordered for further assessment.   Patient is currently not on blood thinners.  Will make NPO at midnight/hold TF for possible gastrostomy placement once anatomy reviewed by IR MD and pending further assessment by SLP.  PA met with patient and daughter at bedside.  Discussed the benefits as well as limitations of a feeding tube on improving health and quality of life in the setting of chronic illness and multiple comorbidites. They are anticipating need for gastrostomy and feel that regardless of whether patient decides to pursue other interventions that the gastrostomy tube is needed and wanted.  IR to follow for ongoing assessment and possible intervention.  Thank you for this interesting consult.  I greatly enjoyed meeting Collin Young and look forward to participating in their care.  A copy of this report was sent to the requesting provider on this date.  Electronically Signed: Docia Barrier 01/11/2017, 12:22 PM   I spent a total of 40 Minutes    in face to face in clinical consultation, greater than 50% of which was counseling/coordinating care for esophagitis/dysphagia

## 2017-01-11 NOTE — Progress Notes (Signed)
[facility]  Date of Admission:  01/02/2017           Day 5 cefepime  Principal Problem:   Staphylococcus aureus bacteremia Active Problems:   Aspiration pneumonia of both lower lobes due to gastric secretions (HCC)   Malignant neoplasm of pyriform sinus (HCC)   Acute on chronic combined systolic and diastolic CHF (congestive heart failure) (HCC)   AKI (acute kidney injury) ([place])   Acute pulmonary edema (HCC)   Congestive dilated cardiomyopathy (HCC)   Acute congestive heart failure ([place])   Ischemic cardiomyopathy   Acute hypoxemic respiratory failure ([place])   Palliative care by specialist   Dysphagia   Encounter for feeding tube placement   . aspirin EC  81 mg Oral Daily  . atorvastatin  20 mg Oral Daily  . budesonide (PULMICORT) nebulizer solution  0.5 mg Nebulization BID  . carvedilol  6.25 mg Oral BID WC  . multivitamin with minerals  1 tablet Oral Daily  . sodium chloride flush  3 mL Intravenous Q12H    SUBJECTIVE: He states that he is feeling better.  Review of Systems: Review of Systems  Constitutional: Negative for chills, diaphoresis and fever.       He is alert and comfortable sitting up in chair. He is asking for some ginger ale to drink.  Respiratory: Positive for cough. Negative for sputum production and shortness of breath.   Cardiovascular: Negative for chest pain.  Gastrointestinal: Negative for abdominal pain, diarrhea, nausea and vomiting.    Past Medical History:  Diagnosis Date  . Cancer [facility])    Esophagus- radiation 2011  . GERD (gastroesophageal reflux disease)    takes zantac prn  . Headache   . History of radiation therapy 10/19/2013-12/05/2013   pyriform sinus 69.96 Gy/61fx  . HOH (hard of hearing)   . Hyperlipemia   . Hypertension   . Seizures ([place])    alcohol withdrawls- 2001    Social History  Substance Use Topics  . Smoking status: Former Smoker    Packs/day: 1.00    Years: 40.00    Types:  Cigarettes    Start date: 11/08/1960    Quit date: 09/01/1999  . Smokeless tobacco: Former Systems developer    Types: [place] date: 09/01/1999     Comment: quit in 2011  . Alcohol use No     Comment: quit in 2001    History reviewed. No pertinent family history. No Known Allergies  OBJECTIVE: Vitals:   01/10/17 2355 01/11/17 0100 01/11/17 0541 01/11/17 0800  BP: 138/77 129/74 133/72   Pulse: 88 88 90   Resp: (!) 23 (!) 23 (!) 22   Temp:  99.7 F (37.6 C) 98.2 F (36.8 C) 97.4 F (36.3 C)  TempSrc:  Oral Oral Axillary  SpO2: 100% 100% 98%   Weight:   122 lb 8 oz (55.6 kg)   Height:       Body mass index is 19.77 kg/m.  Physical Exam  Constitutional: He is oriented to person, place, and time. No distress.  Cardiovascular: Normal rate and regular rhythm.   No murmur heard. Distant heart sounds.  Pulmonary/Chest: Effort normal. He has no wheezes. He has no rales.  Rhonchi bilaterally. Port-A-Cath site appears normal  Abdominal: Soft. There is no tenderness.  Musculoskeletal: Normal range of motion. He exhibits no edema or tenderness.  Neurological: He is alert and oriented to person, place, and time.  Skin: No rash noted.  Psychiatric: Mood and affect normal.    Lab Results Lab Results  Component Value Date   WBC 12.6 (H) 01/11/2017   HGB 10.9 (L) 01/11/2017   HCT 33.3 (L) 01/11/2017   MCV 86.5 01/11/2017   PLT 172 01/11/2017    Lab Results  Component Value Date   CREATININE 0.91 01/11/2017   BUN 24 (H) 01/11/2017   NA 133 (L) 01/11/2017   K 4.0 01/11/2017   CL 96 (L) 01/11/2017   CO2 29 01/11/2017    Lab Results  Component Value Date   ALT 25 01/02/2017   AST 39 01/02/2017   ALKPHOS 37 (L) 01/02/2017   BILITOT 0.4 01/02/2017     Microbiology: Recent Results (from the past 240 hour(s))  Culture, blood (Routine X 2) w Reflex to ID Panel     Status: None (Preliminary result)   Collection Time: 01/07/17  3:43 PM  Result Value Ref Range Status   Specimen  Description BLOOD RIGHT ANTECUBITAL  Final   Special Requests IN PEDIATRIC BOTTLE Blood Culture adequate volume  Final   Culture NO GROWTH 4 DAYS  Final   Report Status PENDING  Incomplete  Culture, blood (Routine X 2) w Reflex to ID Panel     Status: Abnormal   Collection Time: 01/07/17  3:47 PM  Result Value Ref Range Status   Specimen Description BLOOD RIGHT HAND  Final   Special Requests IN PEDIATRIC BOTTLE Blood Culture adequate volume  Final   Culture  Setup Time   Final    GRAM POSITIVE COCCI IN CLUSTERS IN PEDIATRIC BOTTLE CRITICAL RESULT CALLED TO, READ BACK BY AND VERIFIED WITH: Leonie Green Pharm.D. 13:30 01/08/17 ([email or web address])    Culture STAPHYLOCOCCUS AUREUS (A)  Final   Report Status 01/10/2017 FINAL  Final   Organism ID, Bacteria STAPHYLOCOCCUS AUREUS  Final      Susceptibility   Staphylococcus aureus - MIC*    CIPROFLOXACIN <=0.5 SENSITIVE Sensitive     ERYTHROMYCIN <=0.25 SENSITIVE Sensitive     GENTAMICIN <=0.5 SENSITIVE Sensitive     OXACILLIN 0.5 SENSITIVE Sensitive     TETRACYCLINE <=1 SENSITIVE Sensitive     VANCOMYCIN <=0.5 SENSITIVE Sensitive     TRIMETH/SULFA <=10 SENSITIVE Sensitive     CLINDAMYCIN <=0.25 SENSITIVE Sensitive     RIFAMPIN <=0.5 SENSITIVE Sensitive     Inducible Clindamycin NEGATIVE Sensitive     * STAPHYLOCOCCUS AUREUS  Blood Culture ID Panel (Reflexed)     Status: Abnormal   Collection Time: 01/07/17  3:47 PM  Result Value Ref Range Status   Enterococcus species NOT DETECTED NOT DETECTED Final   Listeria monocytogenes NOT DETECTED NOT DETECTED Final   Staphylococcus species DETECTED (A) NOT DETECTED Final    Comment: CRITICAL RESULT CALLED TO, READ BACK BY AND VERIFIED WITH: MRadford Pax Pharm.D. 13:30 01/08/17 ([email or web address])    Staphylococcus aureus DETECTED (A) NOT DETECTED Final    Comment: Methicillin (oxacillin) susceptible Staphylococcus aureus (MSSA). Preferred therapy is anti staphylococcal beta lactam antibiotic (Cefazolin or Nafcillin),  unless clinically contraindicated. CRITICAL RESULT CALLED TO, READ BACK BY AND VERIFIED WITH: M. Radford Pax Pharm.D. 13:30 01/08/17 (wilsonm)    Methicillin resistance NOT DETECTED NOT DETECTED Final   Streptococcus species NOT DETECTED NOT DETECTED Final   Streptococcus agalactiae NOT DETECTED NOT DETECTED Final   Streptococcus pneumoniae NOT DETECTED NOT DETECTED Final   Streptococcus pyogenes NOT DETECTED NOT DETECTED Final   Acinetobacter baumannii NOT DETECTED NOT DETECTED Final   Enterobacteriaceae  species NOT DETECTED NOT DETECTED Final   Enterobacter cloacae complex NOT DETECTED NOT DETECTED Final   Escherichia coli NOT DETECTED NOT DETECTED Final   Klebsiella oxytoca NOT DETECTED NOT DETECTED Final   Klebsiella pneumoniae NOT DETECTED NOT DETECTED Final   Proteus species NOT DETECTED NOT DETECTED Final   Serratia marcescens NOT DETECTED NOT DETECTED Final   Haemophilus influenzae NOT DETECTED NOT DETECTED Final   Neisseria meningitidis NOT DETECTED NOT DETECTED Final   Pseudomonas aeruginosa NOT DETECTED NOT DETECTED Final   Candida albicans NOT DETECTED NOT DETECTED Final   Candida glabrata NOT DETECTED NOT DETECTED Final   Candida krusei NOT DETECTED NOT DETECTED Final   Candida parapsilosis NOT DETECTED NOT DETECTED Final   Candida tropicalis NOT DETECTED NOT DETECTED Final  MRSA PCR Screening     Status: None   Collection Time: 01/07/17  5:21 PM  Result Value Ref Range Status   MRSA by PCR NEGATIVE NEGATIVE Final    Comment:        The GeneXpert MRSA Assay (FDA approved for NASAL specimens only), is one component of a comprehensive MRSA colonization surveillance program. It is not intended to diagnose MRSA infection nor to guide or monitor treatment for MRSA infections.   Culture, blood (Routine X 2) w Reflex to ID Panel     Status: Abnormal (Preliminary result)   Collection Time: 01/09/17 10:15 AM  Result Value Ref Range Status   Specimen Description BLOOD RIGHT  HAND  Final   Special Requests   Final    Blood Culture results may not be optimal due to an inadequate volume of blood received in culture bottles   Culture  Setup Time   Final    GRAM POSITIVE COCCI IN CLUSTERS Organism ID to follow IN PEDIATRIC BOTTLE CRITICAL RESULT CALLED TO, READ BACK BY AND VERIFIED WITH: N. JOHNSTON PHARMD, AT 0848 01/10/17 BY D. VANHOOK    Culture (A)  Final    STAPHYLOCOCCUS AUREUS SUSCEPTIBILITIES TO FOLLOW    Report Status PENDING  Incomplete  Blood Culture ID Panel (Reflexed)     Status: Abnormal   Collection Time: 01/09/17 10:15 AM  Result Value Ref Range Status   Enterococcus species NOT DETECTED NOT DETECTED Final   Listeria monocytogenes NOT DETECTED NOT DETECTED Final   Staphylococcus species DETECTED (A) NOT DETECTED Final    Comment: CRITICAL RESULT CALLED TO, READ BACK BY AND VERIFIED WITH: V. JOHNSTON PHARMD, AT 0848 01/10/17 BY D. VANHOOK    Staphylococcus aureus DETECTED (A) NOT DETECTED Final    Comment: Methicillin (oxacillin) susceptible Staphylococcus aureus (MSSA). Preferred therapy is anti staphylococcal beta lactam antibiotic (Cefazolin or Nafcillin), unless clinically contraindicated. CRITICAL RESULT CALLED TO, READ BACK BY AND VERIFIED WITH: V. JOHNSTON PHARMD, AT 0848 01/10/17 BY D. VANHOOK    Methicillin resistance NOT DETECTED NOT DETECTED Final   Streptococcus species NOT DETECTED NOT DETECTED Final   Streptococcus agalactiae NOT DETECTED NOT DETECTED Final   Streptococcus pneumoniae NOT DETECTED NOT DETECTED Final   Streptococcus pyogenes NOT DETECTED NOT DETECTED Final   Acinetobacter baumannii NOT DETECTED NOT DETECTED Final   Enterobacteriaceae species NOT DETECTED NOT DETECTED Final   Enterobacter cloacae complex NOT DETECTED NOT DETECTED Final   Escherichia coli NOT DETECTED NOT DETECTED Final   Klebsiella oxytoca NOT DETECTED NOT DETECTED Final   Klebsiella pneumoniae NOT DETECTED NOT DETECTED Final   Proteus species NOT  DETECTED NOT DETECTED Final   Serratia marcescens NOT DETECTED NOT DETECTED Final  Haemophilus influenzae NOT DETECTED NOT DETECTED Final   Neisseria meningitidis NOT DETECTED NOT DETECTED Final   Pseudomonas aeruginosa NOT DETECTED NOT DETECTED Final   Candida albicans NOT DETECTED NOT DETECTED Final   Candida glabrata NOT DETECTED NOT DETECTED Final   Candida krusei NOT DETECTED NOT DETECTED Final   Candida parapsilosis NOT DETECTED NOT DETECTED Final   Candida tropicalis NOT DETECTED NOT DETECTED Final     ASSESSMENT: He has MSSA bacteremia. Repeat blood cultures 48 hours into therapy remain positive. Repeat blood cultures today. If he has persistently positive cultures which strongly consider Port-A-Cath removal and TEE. I suspect that his pneumonia is due to repeated aspiration of gastric contents. I will go ahead and change cefepime to cefazolin now.  PLAN: 1. Change cefepime to cefazolin 2. Repeat blood cultures 3. We will follow up on 01/13/2017  Michel Bickers, [place] [facility] [phone] pager   [phone] cell 01/11/2017, 12:58 PM

## 2017-01-11 NOTE — Evaluation (Signed)
Physical Therapy Evaluation Patient Details Name: Collin Young MRN: [id number] DOB: Jan 13, 1951 Today's Date: 01/11/2017   History of Present Illness  66 y.o. 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  Found to have severe 3 vessel CAD on cath.  Hospitalization complicated by aspiration pneumonia and MSSA bacteremia  Clinical Impression  Patient evaluated by Physical Therapy with no further acute PT needs identified. All education has been completed and the patient has no further questions. Pt very active PTA and still mod I with ambulation without AD at this point. Daughter reports that she will be able to stay with him at d/c. At this point, not recommending rehab but rather home with return to as much of his normal activity as he is able/ wants to do. Spoke with mobility team about ambulating him each day.  See below for any follow-up Physical Therapy or equipment needs. PT is signing off. Thank you for this referral.     Follow Up Recommendations No PT follow up    Equipment Recommendations  None recommended by PT    Recommendations for Other Services Other (comment) (mobility team)     Precautions / Restrictions Precautions Precautions: Other (comment) Precaution Comments: NG tube Restrictions Weight Bearing Restrictions: No      Mobility  Bed Mobility               General bed mobility comments: pt up in chair  Transfers Overall transfer level: Modified independent               General transfer comment: no difficulties with sit to stand  Ambulation/Gait Ambulation/Gait assistance: Modified independent (Device/Increase time) Ambulation Distance (Feet): 300 Feet Assistive device: None Gait Pattern/deviations: Step-through pattern Gait velocity: WFL Gait velocity  interpretation: at or above normal speed for age/gender General Gait Details: pt loves to walk! Slightly hesitant at first because first time up but became steadier with increased pace as he kept walking  Stairs            Wheelchair Mobility    Modified Rankin (Stroke Patients Only)       Balance Overall balance assessment: No apparent balance deficits (not formally assessed)                                           Pertinent Vitals/Pain Pain Assessment: No/denies pain    Home Living Family/patient expects to be discharged to:: Private residence Living Arrangements: Alone Available Help at Discharge: Family;Available 24 hours/day Type of Home: House Home Access: Level entry     Home Layout: One level Home Equipment: None Additional Comments: daughter working it out so she can stay with him as needed    Prior Function Level of Independence: Independent         Comments: drives, cooks, cleans, etc, very active     Hand Dominance        Extremity/Trunk Assessment   Upper Extremity Assessment Upper Extremity Assessment: Overall WFL for tasks assessed    Lower Extremity Assessment Lower Extremity Assessment: Overall WFL for tasks assessed    Cervical / Trunk Assessment Cervical / Trunk Assessment: Normal  Communication   Communication: No difficulties  Cognition Arousal/Alertness: Awake/alert Behavior During Therapy: WFL for tasks assessed/performed Overall  Cognitive Status: Within Functional Limits for tasks assessed                                        General Comments General comments (skin integrity, edema, etc.): O2 sats 100% on RA throughout. Left O2 off after session due to stability    Exercises     Assessment/Plan    PT Assessment Patent does not need any further PT services  PT Problem List         PT Treatment Interventions      PT Goals (Current goals can be found in the Care Plan  section)  Acute Rehab PT Goals Patient Stated Goal: return to his home PT Goal Formulation: All assessment and education complete, DC therapy    Frequency     Barriers to discharge        Co-evaluation               AM-PAC PT "6 Clicks" Daily Activity  Outcome Measure Difficulty turning over in bed (including adjusting bedclothes, sheets and blankets)?: None Difficulty moving from lying on back to sitting on the side of the bed? : None Difficulty sitting down on and standing up from a chair with arms (e.g., wheelchair, bedside commode, etc,.)?: None Help needed moving to and from a bed to chair (including a wheelchair)?: None Help needed walking in hospital room?: None Help needed climbing 3-5 steps with a railing? : A Little 6 Click Score: 23    End of Session Equipment Utilized During Treatment: Gait belt Activity Tolerance: Patient tolerated treatment well Patient left: in chair;with call bell/phone within reach;with family/visitor present Nurse Communication: Mobility status PT Visit Diagnosis: Muscle weakness (generalized) (M62.81)    Time: 9179-1505 PT Time Calculation (min) (ACUTE ONLY): 20 min   Charges:   PT Evaluation $PT Eval Moderate Complexity: 1 Procedure     PT G Codes:        Leighton Roach, PT  Acute Rehab Services  [place] 01/11/2017, 12:56 PM

## 2017-01-12 ENCOUNTER — Inpatient Hospital Stay (HOSPITAL_COMMUNITY): Payer: Medicare Other

## 2017-01-12 DIAGNOSIS — J690 Pneumonitis due to inhalation of food and vomit: Secondary | ICD-10-CM

## 2017-01-12 DIAGNOSIS — R7881 Bacteremia: Secondary | ICD-10-CM

## 2017-01-12 LAB — CULTURE, BLOOD (ROUTINE X 2)
CULTURE: NO GROWTH
Special Requests: ADEQUATE

## 2017-01-12 LAB — BASIC METABOLIC PANEL
Anion gap: 7 (ref 5–15)
BUN: 20 mg/dL (ref 6–20)
CO2: 28 mmol/L (ref 22–32)
Calcium: 7.8 mg/dL — ABNORMAL LOW (ref 8.9–10.3)
Chloride: 95 mmol/L — ABNORMAL LOW (ref 101–111)
Creatinine, Ser: 0.83 mg/dL (ref 0.61–1.24)
GFR calc Af Amer: >60 mL/min (ref >60)
GFR calc non Af Amer: >60 mL/min (ref >60)
Glucose, Bld: 153 mg/dL — ABNORMAL HIGH (ref 65–99)
Potassium: 3.9 mmol/L (ref 3.5–5.1)
Sodium: 130 mmol/L — ABNORMAL LOW (ref 135–145)

## 2017-01-12 LAB — COOXEMETRY PANEL
Carboxyhemoglobin: 1.3 % (ref 0.5–1.5)
Methemoglobin: 0.9 % (ref 0.0–1.5)
O2 Saturation: 65.7 %
TOTAL HEMOGLOBIN: 11.8 g/dL — AB (ref 12.0–16.0)

## 2017-01-12 LAB — CBC
HEMATOCRIT: 35.7 % — AB (ref 39.0–52.0)
Hemoglobin: 11.7 g/dL — ABNORMAL LOW (ref 13.0–17.0)
MCH: 28.3 pg (ref 26.0–34.0)
MCHC: 32.8 g/dL (ref 30.0–36.0)
MCV: 86.4 fL (ref 78.0–100.0)
Platelets: 193 10*3/uL (ref 150–400)
RBC: 4.13 MIL/uL — ABNORMAL LOW (ref 4.22–5.81)
RDW: 14.2 % (ref 11.5–15.5)
WBC: 13.6 10*3/uL — ABNORMAL HIGH (ref 4.0–10.5)

## 2017-01-12 LAB — GLUCOSE, CAPILLARY
GLUCOSE-CAPILLARY: 110 mg/dL — AB (ref 65–99)
Glucose-Capillary: 167 mg/dL — ABNORMAL HIGH (ref 65–99)
Glucose-Capillary: 115 mg/dL — ABNORMAL HIGH (ref 65–99)
Glucose-Capillary: 152 mg/dL — ABNORMAL HIGH (ref 65–99)
Glucose-Capillary: 137 mg/dL — ABNORMAL HIGH (ref 65–99)
Glucose-Capillary: 160 mg/dL — ABNORMAL HIGH (ref 65–99)

## 2017-01-12 MED ORDER — WHITE PETROLATUM GEL
Status: DC | PRN
  Filled 2017-01-12: qty 1

## 2017-01-12 NOTE — Progress Notes (Signed)
  Speech Language Pathology Treatment: Dysphagia  Patient Details Name: Collin Young MRN: [id number] DOB: 11/20/1950 Today's Date: 01/12/2017 Time: 4536-4680 SLP Time Calculation (min) (ACUTE ONLY): 18 min  Assessment / Plan / Recommendation Clinical Impression  Ongoing f/u for dysphagia.  Treatment focused on execution of exercises directed at submental/hyoid musculature.  Pt completed three sets of 30 reps of chin tuck against resistance exercises; completed ten effortful swallows with limited ice chips.  Min cues required for execution.  Pt understood rationale for postponing PEG.  No family present today.  Will continue therapy; will need intensive f/u after D/C.   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 CXR significant for pulmonary edema. Treated with IV lasix. Cardiology was consulted and patient underwent right heart cath on 5/18 in which revealed multivessel CAD with plans to do PCI of RCA next week if renal function is stable. On 5/10 patient became tachypneic and hypoxic, given 40 mg IV lasix with 327ml output, with improvement. CXR shows worsening airspace opacities compatible with PNA or asymmetric pulmonary edema, mild cardiomegaly without vascular congestion. Pt was seen in OP SLP tx over 11 months, 4 visits. Pt reported that swallowing issues had resolved, but was  noncompliant with exercise program. Pt had G tube during RXT, it was removed in 2015.  In early 2016 SLP reported consistent throat clearing/cough observed with thin liquids, small sip chin tuck and effortful swallow x2 eliminated cough. No MBS or FEES on record.       SLP Plan  Continue with current plan of care       Recommendations  Diet recommendations: NPO                 Oral Care Recommendations: Oral care QID Follow up Recommendations: Skilled Nursing facility SLP Visit Diagnosis: Dysphagia, oropharyngeal phase (R13.12) Plan: Continue with current plan of care       GO                Juan Quam Laurice 01/12/2017, 2:20 PM  Francie Keeling L. Tivis Ringer, [place] CCC/SLP Pager [phone]

## 2017-01-12 NOTE — Progress Notes (Signed)
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 He presented to the emergency department complaining of progressive shortness of breath for the past several weeks. He was admitted with concerns of CHF, with BNP significantly elevated at 2000. Echocardiogram revealed ejection fraction of 10-15% patient was started on beta blockers Lasix and Aldactone. Patient also on admission was found to be on acute kidney injury. Cardiology consulted, underwent cardiac cath showing significant multivessel disease. Recommended staged PCI when creatinine improves. Dysphagia was evaluated with recommendation per SLP for total NPO status, NG tube and reevaluation once clinical status improved. Blood cultures have been positive for MSSA and ID is making recommendations. Palliative care has been consulted and after family meeting, patient is DNR but would like to pursue PEG tube. This is planned pending clearance of bacteremia. Repeat blood cultures from 5/12 following initiation of effective abx grew MSSA again in ~48 hrs. Repeat cultures were drawn 5/14. If these are positive, the patient may require pulling port and TEE. Eventually, PCI is planned, though preference would be for PEG tube placement prior to PCI due to requirement for antiplatelet.   Subjective: Breathing easier, no chest pain.  Assessment & Plan: Acute respiratory failure with hypoxia: Due to ischemic cardiomyopathy/acute systolic CHF, pulmonary edema, and likely aspiration pneumonitis/pneumonia. CXR show worsening air space disease, pneumonia vs pulm edema, he is afebrile and no leukocytosis.  - Resolved - Continue nebs per pulmonology -  Continue antibiotics as below - Sputum culture sent, not returned yet.  Acute systolic heart failure: New diagnosis, due to multivessel CAD.  - NTG gtt for pulmonary edema. Holding lasix for AKI. ACE and aldactone on hold due to AKI   - Daily weight, I&O's low salt diet  - Milrinone per cardiology, hopeful to wean after LHC. Doubt he would do well at home with this.   Ischemic cardiomyopathy: Left main and LAD with moderate disease and chronically occluded LCx in addition to 99% stenosis of RCA thought to be amenable to atherectomy/staged PCI pending renal function.  - ASA, statin, plavix. - LHC planned per cardiology.    Severe aspiration: Related to h/o radiation to esophagus, MBSS 5/11 demonstrated frank aspiration of all consistencies.  - NPO - Cortrak placed, nutrition consulted.  - Patient wishes to proceed with PEG placement. Plavix/ticagrelor would need to be held for this, so this is being pursued prior to PCI. Discussed with Dr. [name], IR wishes to observe since patient has had low grade fever in past 24 hours. Will follow up 5/16.   MSSA bacteremia: in 1 of 2 blood cultures from 5/10 and again on 5/12. PCT improving 10 > 2.  - ID recommending cefepime x7 days for aspiration, convert to ancef for total 14 days.  - Functional status insufficient for TEE. Could consider pulling port vs. empirically treating with long duration.  - Repeat blood cultures 5/14 NGTD, though WBC rising.    AKI: Suspect cardiorenal syndrome vs from contrast vs diuretics. Poor CO leading to poor kidney perfusion. FENa 0.64% suggestive of prerenal etiology. - Monitor daily, resolved  Complex left renal cyst: Warrants additional surveillance with MRI or CT with contrast will defer after patient stable given patient will receive multiple contrast doses for cardiac  cath. This can be follow as outpatient as well.   Enlarged prostate: Seen on U/S. No LUTS/BOO. PSA normal.  - No medications given lack of  symptoms.   Hypertension: Per patient was taken off his BP meds by PCP PTA  - Continue coreg and NTG gtt as tolerated   Squamous cell CA of the left piriform sinus and esophagus: In remission  - Continuing home fentanyl patch.   Hyponatremia: Suspect hypervolemia, improving, mild. - Monitor  DVT prophylaxis: Lovenox  Code Status: Full Family Communication: None at bedside this AM Disposition Plan: Continue SDU level of care given milrinone, NTG drip titration.    Consultants:   Cardiology   PCCM  ID   Palliative care  IR  Procedures:   ECHO 01/03/17 - Left ventricle: The cavity size was normal. Wall thickness was   normal. The estimated ejection fraction was in the range of 10%   to 15%. Diffuse hypokinesis. DIastolic function is abnormal,   indeterminant grade. - Aortic valve: There was moderate regurgitation. Valve area (VTI):   1.08 cm^2. Valve area (Vmax): 1.5 cm^2. Valve area (Vmean): 1.5   cm^2. - Mitral valve: There was moderate regurgitation. - Left atrium: The atrium was mildly dilated. - Right ventricle: Systolic function was mildly to moderately   reduced. - Tricuspid valve: There was moderate regurgitation. - Pulmonary arteries: Systolic pressure was moderately increased.   PA peak pressure: 49 mm Hg (S).  Right/Left Heart Cath and Coronary Angiography 01/05/2017     Prox RCA to Mid RCA lesion, 99 %stenosed.  Mid RCA lesion, 30 %stenosed.  Dist RCA lesion, 40 %stenosed.  RPDA lesion, 20 %stenosed.  Prox Cx lesion, 100 %stenosed.  Ost Ramus to Ramus lesion, 50 %stenosed.  Ost 3rd Diag to 3rd Diag lesion, 30 %stenosed.  Mid LAD lesion, 40 %stenosed.  Ost LAD to Prox LAD lesion, 20 %stenosed.  Ost LM to LM lesion, 40 %stenosed.  Hemodynamic findings consistent with mild pulmonary hypertension.  1. Severe calcific disease in the proximal to mid RCA 2. 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 Moderate left main and mid LAD  stenosis 4. Elevated filling pressures  Recommendations: He has multivessel CAD. I think the RCA can be approached with PCI but will likely require atherectomy prior to stenting. I would treat the remainder of his CAD medically. Will stage PCI given renal insufficiency. Hydrate gently tonight. May need diuresis tomorrow given elevated filling pressures. Would anticipate PCI on Thursday or Friday of this week if renal function remains stable.    Antimicrobials: Vanc/cefepime > cefepime 5/12.   Objective: Vitals:   01/12/17 0730 01/12/17 0846 01/12/17 0951 01/12/17 1128  BP: 134/80   119/63  Pulse: 96 90  86  Resp: 16   (!) 21  Temp: 99.1 F (37.3 C)   99.2 F (37.3 C)  TempSrc: Axillary   Oral  SpO2: 100% 98%  97%  Weight:   56.6 kg (124 lb 12.8 oz)   Height:        Intake/Output Summary (Last 24 hours) at 01/12/17 1512 Last data filed at 01/12/17 0900  Gross per 24 hour  Intake           1557.8 ml  Output                0 ml  Net           1557.8 ml   Filed Weights   01/10/17 0514 01/11/17 0541 01/12/17 0951  Weight:  54.7 kg (120 lb 8 oz) 55.6 kg (122 lb 8 oz) 56.6 kg (124 lb 12.8 oz)    Examination: General exam: Thin male in no distress. Hoarse voice, cortrak tube in place in R nare. Respiratory system: No accessory muscle use on room air. Improved bilateral crackles at bases Cardiovascular system: Regular, no murmur, no JVD   Gastrointestinal system: Abd Soft NTND, +BS Extremities: No LE edema  Skin: No lesions or rash. Right upper chest port site c/d/i without erythema. Data Reviewed: I have personally reviewed following labs and imaging studies  CBC:  Recent Labs Lab 01/06/17 0423 01/08/17 0410 01/10/17 0500 01/11/17 0523 01/12/17 0446  WBC 8.0 8.1 10.7* 12.6* 13.6*  HGB 13.8 11.5* 11.5* 10.9* 11.7*  HCT 42.0 34.8* 35.2* 33.3* 35.7*  MCV 86.1 84.7 86.1 86.5 86.4  PLT 189 144* 174 172 500   Basic Metabolic Panel:  Recent Labs Lab 01/07/17 2035  01/08/17 0410 01/09/17 0451 01/10/17 0500 01/11/17 0523 01/12/17 0446  NA  --  125* 128* 130* 133* 130*  K  --  4.4 3.9 3.8 4.0 3.9  CL  --  86* 88* 92* 96* 95*  CO2  --  27 32 33* 29 28  GLUCOSE  --  91 122* 154* 127* 153*  BUN  --  66* 47* 30* 24* 20  CREATININE  --  2.02* 1.33* 1.15 0.91 0.83  CALCIUM  --  8.3* 8.0* 7.9* 7.4* 7.8*  MG 2.5* 2.7*  --   --   --   --   PHOS 2.8 3.5  --   --   --   --    GFR: Estimated Creatinine Clearance: 70.1 mL/min (by C-G formula based on SCr of 0.83 mg/dL). Liver Function Tests: No results for input(s): AST, ALT, ALKPHOS, BILITOT, PROT, ALBUMIN in the last 168 hours. Recent Results (from the past 240 hour(s))  Culture, blood (Routine X 2) w Reflex to ID Panel     Status: None   Collection Time: 01/07/17  3:43 PM  Result Value Ref Range Status   Specimen Description BLOOD RIGHT ANTECUBITAL  Final   Special Requests IN PEDIATRIC BOTTLE Blood Culture adequate volume  Final   Culture NO GROWTH 5 DAYS  Final   Report Status 01/12/2017 FINAL  Final  Culture, blood (Routine X 2) w Reflex to ID Panel     Status: Abnormal   Collection Time: 01/07/17  3:47 PM  Result Value Ref Range Status   Specimen Description BLOOD RIGHT HAND  Final   Special Requests IN PEDIATRIC BOTTLE Blood Culture adequate volume  Final   Culture  Setup Time   Final    GRAM POSITIVE COCCI IN CLUSTERS IN PEDIATRIC BOTTLE CRITICAL RESULT CALLED TO, READ BACK BY AND VERIFIED WITH: Leonie Green Pharm.D. 13:30 01/08/17 ([email or web address])    Culture STAPHYLOCOCCUS AUREUS (A)  Final   Report Status 01/10/2017 FINAL  Final   Organism ID, Bacteria STAPHYLOCOCCUS AUREUS  Final      Susceptibility   Staphylococcus aureus - MIC*    CIPROFLOXACIN <=0.5 SENSITIVE Sensitive     ERYTHROMYCIN <=0.25 SENSITIVE Sensitive     GENTAMICIN <=0.5 SENSITIVE Sensitive     OXACILLIN 0.5 SENSITIVE Sensitive     TETRACYCLINE <=1 SENSITIVE Sensitive     VANCOMYCIN <=0.5 SENSITIVE Sensitive     TRIMETH/SULFA  <=10 SENSITIVE Sensitive     CLINDAMYCIN <=0.25 SENSITIVE Sensitive     RIFAMPIN <=0.5 SENSITIVE Sensitive     Inducible Clindamycin NEGATIVE Sensitive     *  STAPHYLOCOCCUS AUREUS  Blood Culture ID Panel (Reflexed)     Status: Abnormal   Collection Time: 01/07/17  3:47 PM  Result Value Ref Range Status   Enterococcus species NOT DETECTED NOT DETECTED Final   Listeria monocytogenes NOT DETECTED NOT DETECTED Final   Staphylococcus species DETECTED (A) NOT DETECTED Final    Comment: CRITICAL RESULT CALLED TO, READ BACK BY AND VERIFIED WITH: M. Radford Pax Pharm.D. 13:30 01/08/17 ([email or web address])    Staphylococcus aureus DETECTED (A) NOT DETECTED Final    Comment: Methicillin (oxacillin) susceptible Staphylococcus aureus (MSSA). Preferred therapy is anti staphylococcal beta lactam antibiotic (Cefazolin or Nafcillin), unless clinically contraindicated. CRITICAL RESULT CALLED TO, READ BACK BY AND VERIFIED WITH: M. Radford Pax Pharm.D. 13:30 01/08/17 ([email or web address])    Methicillin resistance NOT DETECTED NOT DETECTED Final   Streptococcus species NOT DETECTED NOT DETECTED Final   Streptococcus agalactiae NOT DETECTED NOT DETECTED Final   Streptococcus pneumoniae NOT DETECTED NOT DETECTED Final   Streptococcus pyogenes NOT DETECTED NOT DETECTED Final   Acinetobacter baumannii NOT DETECTED NOT DETECTED Final   Enterobacteriaceae species NOT DETECTED NOT DETECTED Final   Enterobacter cloacae complex NOT DETECTED NOT DETECTED Final   Escherichia coli NOT DETECTED NOT DETECTED Final   Klebsiella oxytoca NOT DETECTED NOT DETECTED Final   Klebsiella pneumoniae NOT DETECTED NOT DETECTED Final   Proteus species NOT DETECTED NOT DETECTED Final   Serratia marcescens NOT DETECTED NOT DETECTED Final   Haemophilus influenzae NOT DETECTED NOT DETECTED Final   Neisseria meningitidis NOT DETECTED NOT DETECTED Final   Pseudomonas aeruginosa NOT DETECTED NOT DETECTED Final   Candida albicans NOT DETECTED NOT DETECTED Final    Candida glabrata NOT DETECTED NOT DETECTED Final   Candida krusei NOT DETECTED NOT DETECTED Final   Candida parapsilosis NOT DETECTED NOT DETECTED Final   Candida tropicalis NOT DETECTED NOT DETECTED Final  MRSA PCR Screening     Status: None   Collection Time: 01/07/17  5:21 PM  Result Value Ref Range Status   MRSA by PCR NEGATIVE NEGATIVE Final    Comment:        The GeneXpert MRSA Assay (FDA approved for NASAL specimens only), is one component of a comprehensive MRSA colonization surveillance program. It is not intended to diagnose MRSA infection nor to guide or monitor treatment for MRSA infections.   Culture, blood (Routine X 2) w Reflex to ID Panel     Status: Abnormal   Collection Time: 01/09/17 10:15 AM  Result Value Ref Range Status   Specimen Description BLOOD RIGHT HAND  Final   Special Requests   Final    Blood Culture results may not be optimal due to an inadequate volume of blood received in culture bottles   Culture  Setup Time   Final    GRAM POSITIVE COCCI IN CLUSTERS Organism ID to follow IN PEDIATRIC BOTTLE CRITICAL RESULT CALLED TO, READ BACK BY AND VERIFIED WITH: N. JOHNSTON PHARMD, AT 0848 01/10/17 BY D. VANHOOK    Culture STAPHYLOCOCCUS AUREUS (A)  Final   Report Status 01/12/2017 FINAL  Final   Organism ID, Bacteria STAPHYLOCOCCUS AUREUS  Final      Susceptibility   Staphylococcus aureus - MIC*    CIPROFLOXACIN <=0.5 SENSITIVE Sensitive     ERYTHROMYCIN <=0.25 SENSITIVE Sensitive     GENTAMICIN <=0.5 SENSITIVE Sensitive     OXACILLIN <=0.25 SENSITIVE Sensitive     TETRACYCLINE <=1 SENSITIVE Sensitive     VANCOMYCIN <=0.5 SENSITIVE Sensitive     TRIMETH/SULFA <=10 SENSITIVE  Sensitive     CLINDAMYCIN <=0.25 SENSITIVE Sensitive     RIFAMPIN <=0.5 SENSITIVE Sensitive     Inducible Clindamycin NEGATIVE Sensitive     * STAPHYLOCOCCUS AUREUS  Blood Culture ID Panel (Reflexed)     Status: Abnormal   Collection Time: 01/09/17 10:15 AM  Result Value Ref  Range Status   Enterococcus species NOT DETECTED NOT DETECTED Final   Listeria monocytogenes NOT DETECTED NOT DETECTED Final   Staphylococcus species DETECTED (A) NOT DETECTED Final    Comment: CRITICAL RESULT CALLED TO, READ BACK BY AND VERIFIED WITH: V. JOHNSTON PHARMD, AT 0848 01/10/17 BY D. VANHOOK    Staphylococcus aureus DETECTED (A) NOT DETECTED Final    Comment: Methicillin (oxacillin) susceptible Staphylococcus aureus (MSSA). Preferred therapy is anti staphylococcal beta lactam antibiotic (Cefazolin or Nafcillin), unless clinically contraindicated. CRITICAL RESULT CALLED TO, READ BACK BY AND VERIFIED WITH: V. JOHNSTON PHARMD, AT 0848 01/10/17 BY D. VANHOOK    Methicillin resistance NOT DETECTED NOT DETECTED Final   Streptococcus species NOT DETECTED NOT DETECTED Final   Streptococcus agalactiae NOT DETECTED NOT DETECTED Final   Streptococcus pneumoniae NOT DETECTED NOT DETECTED Final   Streptococcus pyogenes NOT DETECTED NOT DETECTED Final   Acinetobacter baumannii NOT DETECTED NOT DETECTED Final   Enterobacteriaceae species NOT DETECTED NOT DETECTED Final   Enterobacter cloacae complex NOT DETECTED NOT DETECTED Final   Escherichia coli NOT DETECTED NOT DETECTED Final   Klebsiella oxytoca NOT DETECTED NOT DETECTED Final   Klebsiella pneumoniae NOT DETECTED NOT DETECTED Final   Proteus species NOT DETECTED NOT DETECTED Final   Serratia marcescens NOT DETECTED NOT DETECTED Final   Haemophilus influenzae NOT DETECTED NOT DETECTED Final   Neisseria meningitidis NOT DETECTED NOT DETECTED Final   Pseudomonas aeruginosa NOT DETECTED NOT DETECTED Final   Candida albicans NOT DETECTED NOT DETECTED Final   Candida glabrata NOT DETECTED NOT DETECTED Final   Candida krusei NOT DETECTED NOT DETECTED Final   Candida parapsilosis NOT DETECTED NOT DETECTED Final   Candida tropicalis NOT DETECTED NOT DETECTED Final  Culture, blood (routine x 2)     Status: None (Preliminary result)    Collection Time: 01/11/17  2:58 PM  Result Value Ref Range Status   Specimen Description BLOOD RIGHT ANTECUBITAL  Final   Special Requests   Final    BOTTLES DRAWN AEROBIC ONLY Blood Culture adequate volume   Culture NO GROWTH < 24 HOURS  Final   Report Status PENDING  Incomplete  Culture, blood (routine x 2)     Status: None (Preliminary result)   Collection Time: 01/11/17  3:02 PM  Result Value Ref Range Status   Specimen Description BLOOD RIGHT HAND  Final   Special Requests   Final    BOTTLES DRAWN AEROBIC ONLY Blood Culture adequate volume   Culture NO GROWTH < 24 HOURS  Final   Report Status PENDING  Incomplete      Radiology Studies: Ct Abdomen Wo Contrast  Result Date: 01/12/2017 CLINICAL DATA:  Dysphagia, esophagitis, malnutrition and assessment for possible percutaneous gastrostomy tube placement. EXAM: CT ABDOMEN WITHOUT CONTRAST TECHNIQUE: Multidetector CT imaging of the abdomen was performed following the standard protocol without IV contrast. COMPARISON:  None. FINDINGS: Lower chest: Dense consolidation of both lower lobes, left greater than right, with associated small bilateral pleural effusions. Hepatobiliary: Unenhanced appearance of the liver and gallbladder are unremarkable. Pancreas: Unremarkable unenhanced appearance. Spleen: Normal size.  Unremarkable appearance. Adrenals/Urinary Tract: No hydronephrosis. Stomach/Bowel: Feeding tube extends into the proximal  stomach. The stomach is anteriorly positioned and superior to the transverse colon. Anatomy would not be prohibitive to percutaneous gastrostomy. There is no evidence of bowel obstruction or significant ileus. No free air identified. Vascular/Lymphatic: No enlarged lymph nodes. Calcified aorta without evidence of aneurysm. Other: Anasarca.  No focal abnormal fluid collections identified. Musculoskeletal: Unremarkable. IMPRESSION: 1. Anatomy is not prohibitive to percutaneous gastrostomy tube placement. 2. Consolidation  of both lower lobes, left greater than right with associated small bilateral pleural effusions. Electronically Signed   By: Aletta Edouard M.D.   On: 01/12/2017 08:01   Dg Chest Port 1 View  Result Date: 01/10/2017 CLINICAL DATA:  Acute respiratory failure with hypoxia EXAM: PORTABLE CHEST 1 VIEW COMPARISON:  01/09/2017 CXR FINDINGS: Lung volumes are slightly lower than on prior exam. Patchy consolidations are noted at each lung base and right upper lobe with probable small left effusion and mild interstitial edema. No change in port catheter tip position at the cavoatrial junction nor with respect to a feeding tube seen with tip in the expected location of the body of the stomach. The heart is obscured by airspace opacities in left effusion. Aortic atherosclerosis at the arch. IMPRESSION: Persistent patchy bibasilar and right upper lobe airspace opacities likely to reflect changes of pneumonia and/or CHF. Low lung volumes with probable small left pleural effusion. Stable support line and tube positions. Electronically Signed   By: Ashley Royalty M.D.   On: 01/10/2017 23:02   Scheduled Meds: . aspirin EC  81 mg Oral Daily  . atorvastatin  20 mg Oral Daily  . budesonide (PULMICORT) nebulizer solution  0.5 mg Nebulization BID  . carvedilol  6.25 mg Oral BID WC  . multivitamin with minerals  1 tablet Oral Daily  . sodium chloride flush  3 mL Intravenous Q12H   Continuous Infusions: . sodium chloride 250 mL (01/11/17 0800)  .  ceFAZolin (ANCEF) IV Stopped (01/12/17 1458)  . feeding supplement (JEVITY 1.2 CAL) 1,000 mL (01/12/17 1454)  . fluconazole (DIFLUCAN) IV Stopped (01/11/17 1913)  . milrinone 0.25 mcg/kg/min (01/12/17 1432)  . nitroGLYCERIN 16.667 mcg/min (01/11/17 2043)     LOS: 10 days   Vance Gather, MD Pager: Text Page via [email or web address]  [phone]   If 7PM-7AM, please contact night-coverage [email or web address] Password [email or web address] 01/12/2017, 3:12 PM

## 2017-01-12 NOTE — Progress Notes (Signed)
Chief Complaint: Patient was seen today for planned G-tube placement  Referring Physician(s): Dr. Vance Gather  Supervising Physician: Aletta Edouard  Patient Status: [facility] - In-pt  Subjective: Follow up for G-tube planning. Chart reviewed. Imaging reviewed by Dr. [name] and feels anatomy acceptable for perc G-tube. However, pt has been dealing with ongoing bacteremia. Last blood cultures from 5/12 still positive. New blood cx drawn yesterday, too early to report. WBC has been trending up past few days. ID notes/recs reviewed. Has port that was placed in 2015. Reports no issues from this.  Objective: Physical Exam: BP 134/80   Pulse 90   Temp 99.1 F (37.3 C) (Axillary)   Resp 16   Ht 5\' 6"  (1.676 m)   Wt 124 lb 12.8 oz (56.6 kg)   SpO2 98%   BMI 20.14 kg/m  (R)chest port accessed. Site clean, no erythema, swelling, tenderness to suggest focal site infection.    Current Facility-Administered Medications:  .  0.9 %  sodium chloride infusion, 250 mL, Intravenous, PRN, [name], MD, Last Rate: 3 mL/hr at 01/11/17 0800, 250 mL at 01/11/17 0800 .  acetaminophen (TYLENOL) tablet 650 mg, 650 mg, Oral, Q4H PRN, Caren Griffins, MD, 650 mg at 01/11/17 2127 .  aspirin EC tablet 81 mg, 81 mg, Oral, Daily, Skeet Latch, MD, 81 mg at 01/12/17 0900 .  atorvastatin (LIPITOR) tablet 20 mg, 20 mg, Oral, Daily, Gherghe, Costin M, MD, 20 mg at 01/12/17 0900 .  budesonide (PULMICORT) nebulizer solution 0.5 mg, 0.5 mg, Nebulization, BID, de Dios, [place] A, MD, 0.5 mg at 01/12/17 0730 .  carvedilol (COREG) tablet 6.25 mg, 6.25 mg, Oral, BID WC, Patrecia Pour, Christean Grief, MD, 6.25 mg at 01/12/17 0858 .  ceFAZolin (ANCEF) IVPB 1 g/50 mL premix, 1 g, Intravenous, Q8H, Michel Bickers, MD, Stopped at 01/12/17 [phone] .  feeding supplement (JEVITY 1.2 CAL) liquid 1,000 mL, 1,000 mL, Per Tube, Continuous, Patrecia Pour, MD, Stopped at 01/12/17 0700 .  fluconazole (DIFLUCAN) IVPB  100 mg, 100 mg, Intravenous, Q24H, Campbell Riches, MD, Stopped at 01/11/17 1913 .  guaiFENesin-dextromethorphan (ROBITUSSIN DM) 100-10 MG/5ML syrup 5 mL, 5 mL, Oral, Q4H PRN, Kirby-Graham, Karsten Fells, NP, 5 mL at 01/11/17 2126 .  ipratropium-albuterol (DUONEB) 0.5-2.5 (3) MG/3ML nebulizer solution 3 mL, 3 mL, Nebulization, Q4H PRN, Patrecia Pour, MD, 3 mL at 01/12/17 0734 .  lip balm (BLISTEX) ointment, , Topical, PRN, Patrecia Pour, Edwin, MD .  milrinone (PRIMACOR) 20 MG/100 ML (0.2 mg/mL) infusion, 0.25 mcg/kg/min, Intravenous, Continuous, Dorothy Spark, MD, Last Rate: 4.1 mL/hr at 01/11/17 1201, 0.25 mcg/kg/min at 01/11/17 1201 .  multivitamin with minerals tablet 1 tablet, 1 tablet, Oral, Daily, Caren Griffins, MD, 1 tablet at 01/12/17 0900 .  MUSCLE RUB CREA 1 application, 1 application, Topical, PRN, Kirby-Graham, Karsten Fells, NP .  nitroGLYCERIN 50 mg in dextrose 5 % 250 mL (0.2 mg/mL) infusion, 2-200 mcg/min, Intravenous, Titrated, Dorothy Spark, MD, Last Rate: 5 mL/hr at 01/11/17 2043, 16.667 mcg/min at 01/11/17 2043 .  ondansetron (ZOFRAN) injection 4 mg, 4 mg, Intravenous, Q6H PRN, Caren Griffins, MD, 4 mg at 01/05/17 2010 .  phenol (CHLORASEPTIC) mouth spray 1 spray, 1 spray, Mouth/Throat, PRN, Kirby-Graham, Karsten Fells, NP, 1 spray at 01/09/17 [phone]  polyethylene glycol (MIRALAX / GLYCOLAX) packet 17 g, 17 g, Oral, Daily PRN, Jani Gravel, MD, 17 g at 01/04/17 2108 .  polyvinyl alcohol (LIQUIFILM TEARS) 1.4 % ophthalmic solution 1 drop,  1 drop, Both Eyes, PRN, Kirby-Graham, Karsten Fells, NP .  sodium chloride (OCEAN) 0.65 % nasal spray 1 spray, 1 spray, Each Nare, PRN, Kirby-Graham, Karsten Fells, NP .  sodium chloride flush (NS) 0.9 % injection 10-40 mL, 10-40 mL, Intracatheter, PRN, Hosie Poisson, MD, 10 mL at 01/11/17 2147 .  sodium chloride flush (NS) 0.9 % injection 3 mL, 3 mL, Intravenous, Q12H, [name], MD, 3 mL at 01/12/17 0901 .  sodium chloride flush (NS) 0.9 %  injection 3 mL, 3 mL, Intravenous, PRN, [name], MD  Facility-Administered Medications Ordered in Other Encounters:  .  topical emolient (BIAFINE) emulsion, , Topical, Daily, Kyung Rudd, MD  Labs: CBC  Recent Labs  01/11/17 0523 01/12/17 0446  WBC 12.6* 13.6*  HGB 10.9* 11.7*  HCT 33.3* 35.7*  PLT 172 193   BMET  Recent Labs  01/11/17 0523 01/12/17 0446  NA 133* 130*  K 4.0 3.9  CL 96* 95*  CO2 29 28  GLUCOSE 127* 153*  BUN 24* 20  CREATININE 0.91 0.83  CALCIUM 7.4* 7.8*    Studies/Results: Ct Abdomen Wo Contrast  Result Date: 01/12/2017 CLINICAL DATA:  Dysphagia, esophagitis, malnutrition and assessment for possible percutaneous gastrostomy tube placement. EXAM: CT ABDOMEN WITHOUT CONTRAST TECHNIQUE: Multidetector CT imaging of the abdomen was performed following the standard protocol without IV contrast. COMPARISON:  None. FINDINGS: Lower chest: Dense consolidation of both lower lobes, left greater than right, with associated small bilateral pleural effusions. Hepatobiliary: Unenhanced appearance of the liver and gallbladder are unremarkable. Pancreas: Unremarkable unenhanced appearance. Spleen: Normal size.  Unremarkable appearance. Adrenals/Urinary Tract: No hydronephrosis. Stomach/Bowel: Feeding tube extends into the proximal stomach. The stomach is anteriorly positioned and superior to the transverse colon. Anatomy would not be prohibitive to percutaneous gastrostomy. There is no evidence of bowel obstruction or significant ileus. No free air identified. Vascular/Lymphatic: No enlarged lymph nodes. Calcified aorta without evidence of aneurysm. Other: Anasarca.  No focal abnormal fluid collections identified. Musculoskeletal: Unremarkable. IMPRESSION: 1. Anatomy is not prohibitive to percutaneous gastrostomy tube placement. 2. Consolidation of both lower lobes, left greater than right with associated small bilateral pleural effusions. Electronically Signed    By: Aletta Edouard M.D.   On: 01/12/2017 08:01   Dg Chest Port 1 View  Result Date: 01/10/2017 CLINICAL DATA:  Acute respiratory failure with hypoxia EXAM: PORTABLE CHEST 1 VIEW COMPARISON:  01/09/2017 CXR FINDINGS: Lung volumes are slightly lower than on prior exam. Patchy consolidations are noted at each lung base and right upper lobe with probable small left effusion and mild interstitial edema. No change in port catheter tip position at the cavoatrial junction nor with respect to a feeding tube seen with tip in the expected location of the body of the stomach. The heart is obscured by airspace opacities in left effusion. Aortic atherosclerosis at the arch. IMPRESSION: Persistent patchy bibasilar and right upper lobe airspace opacities likely to reflect changes of pneumonia and/or CHF. Low lung volumes with probable small left pleural effusion. Stable support line and tube positions. Electronically Signed   By: Ashley Royalty M.D.   On: 01/10/2017 23:02    Assessment/Plan: Esophagitis/dysphagia/FTT Currently has DHT for TF. Bacteremia with rising WBC and new low grade fever despite IV abx. Repeat blood cultures pending.  Discussed with Dr. [name], given current active infectious issues, need to defer perc G-tube placement for now. IR will follow closely and await repeat blood cultures/labs. As ID has stated, concern that port may need to be  removed as source of ongoing bacteremia. May resume TF for now.    LOS: 10 days   I spent a total of 20 minutes in face to face in clinical consultation, greater than 50% of which was counseling/coordinating care for [facility]  Ascencion Dike PA-C 01/12/2017 9:54 AM

## 2017-01-12 NOTE — Progress Notes (Signed)
Progress Note  Patient Name: Collin Young Date of Encounter: 01/12/2017  Primary Cardiologist: Reola Calkins [place])  Subjective   Reported nausea overnight.  Feeling better this AM.  Denies chest pain or shortness of breath  Inpatient Medications    Scheduled Meds: . aspirin EC  81 mg Oral Daily  . atorvastatin  20 mg Oral Daily  . budesonide (PULMICORT) nebulizer solution  0.5 mg Nebulization BID  . carvedilol  6.25 mg Oral BID WC  . multivitamin with minerals  1 tablet Oral Daily  . sodium chloride flush  3 mL Intravenous Q12H   Continuous Infusions: . sodium chloride 250 mL (01/11/17 0800)  .  ceFAZolin (ANCEF) IV Stopped (01/12/17 0555)  . feeding supplement (JEVITY 1.2 CAL) 1,000 mL (01/11/17 1650)  . fluconazole (DIFLUCAN) IV Stopped (01/11/17 1913)  . milrinone 0.25 mcg/kg/min (01/11/17 1201)  . nitroGLYCERIN 16.667 mcg/min (01/11/17 2043)   PRN Meds: sodium chloride, acetaminophen, guaiFENesin-dextromethorphan, ipratropium-albuterol, lip balm, MUSCLE RUB, ondansetron (ZOFRAN) IV, phenol, polyethylene glycol, polyvinyl alcohol, sodium chloride, sodium chloride flush, sodium chloride flush   Vital Signs    Vitals:   01/12/17 0300 01/12/17 0400 01/12/17 0500 01/12/17 0730  BP:  135/83  134/80  Pulse: 85 91 88 96  Resp: 19 (!) 22 18 16   Temp:    99.1 F (37.3 C)  TempSrc:    Axillary  SpO2: 98% 97% 98% 100%  Weight:      Height:        Intake/Output Summary (Last 24 hours) at 01/12/17 0839 Last data filed at 01/12/17 0500  Gross per 24 hour  Intake           1934.1 ml  Output                0 ml  Net           1934.1 ml   Filed Weights   01/09/17 0500 01/10/17 0514 01/11/17 0541  Weight: 55.2 kg (121 lb 11.2 oz) 54.7 kg (120 lb 8 oz) 55.6 kg (122 lb 8 oz)    Telemetry    Sinus rhythm.  PVCs - Personally Reviewed  ECG    n/a - Personally Reviewed  Physical Exam   GEN: Frial, elderly man in Nno acute distress.  Dophoff tube in R nare. Neck: No  JVD  Cardiac: RRR, no murmurs, rubs, or gallops.  Respiratory: Bilateral rhonchi.  No crackles or wheezes. GI: Soft, nontender, non-distended  MS: No edema; No deformity. Neuro:  Nonfocal  Psych: Normal affect   Labs    Chemistry  Recent Labs Lab 01/10/17 0500 01/11/17 0523 01/12/17 0446  NA 130* 133* 130*  K 3.8 4.0 3.9  CL 92* 96* 95*  CO2 33* 29 28  GLUCOSE 154* 127* 153*  BUN 30* 24* 20  CREATININE 1.15 0.91 0.83  CALCIUM 7.9* 7.4* 7.8*  GFRNONAA >60 >60 >60  GFRAA >60 >60 >60  ANIONGAP 5 8 7      Hematology  Recent Labs Lab 01/10/17 0500 01/11/17 0523 01/12/17 0446  WBC 10.7* 12.6* 13.6*  RBC 4.09* 3.85* 4.13*  HGB 11.5* 10.9* 11.7*  HCT 35.2* 33.3* 35.7*  MCV 86.1 86.5 86.4  MCH 28.1 28.3 28.3  MCHC 32.7 32.7 32.8  RDW 13.7 14.1 14.2  PLT 174 172 193    Cardiac EnzymesNo results for input(s): TROPONINI in the last 168 hours. No results for input(s): TROPIPOC in the last 168 hours.   BNPNo results for input(s): BNP, PROBNP in  the last 168 hours.   DDimer No results for input(s): DDIMER in the last 168 hours.   Radiology    Ct Abdomen Wo Contrast  Result Date: 01/12/2017 CLINICAL DATA:  Dysphagia, esophagitis, malnutrition and assessment for possible percutaneous gastrostomy tube placement. EXAM: CT ABDOMEN WITHOUT CONTRAST TECHNIQUE: Multidetector CT imaging of the abdomen was performed following the standard protocol without IV contrast. COMPARISON:  None. FINDINGS: Lower chest: Dense consolidation of both lower lobes, left greater than right, with associated small bilateral pleural effusions. Hepatobiliary: Unenhanced appearance of the liver and gallbladder are unremarkable. Pancreas: Unremarkable unenhanced appearance. Spleen: Normal size.  Unremarkable appearance. Adrenals/Urinary Tract: No hydronephrosis. Stomach/Bowel: Feeding tube extends into the proximal stomach. The stomach is anteriorly positioned and superior to the transverse colon. Anatomy  would not be prohibitive to percutaneous gastrostomy. There is no evidence of bowel obstruction or significant ileus. No free air identified. Vascular/Lymphatic: No enlarged lymph nodes. Calcified aorta without evidence of aneurysm. Other: Anasarca.  No focal abnormal fluid collections identified. Musculoskeletal: Unremarkable. IMPRESSION: 1. Anatomy is not prohibitive to percutaneous gastrostomy tube placement. 2. Consolidation of both lower lobes, left greater than right with associated small bilateral pleural effusions. Electronically Signed   By: Aletta Edouard M.D.   On: 01/12/2017 08:01   Dg Chest Port 1 View  Result Date: 01/10/2017 CLINICAL DATA:  Acute respiratory failure with hypoxia EXAM: PORTABLE CHEST 1 VIEW COMPARISON:  01/09/2017 CXR FINDINGS: Lung volumes are slightly lower than on prior exam. Patchy consolidations are noted at each lung base and right upper lobe with probable small left effusion and mild interstitial edema. No change in port catheter tip position at the cavoatrial junction nor with respect to a feeding tube seen with tip in the expected location of the body of the stomach. The heart is obscured by airspace opacities in left effusion. Aortic atherosclerosis at the arch. IMPRESSION: Persistent patchy bibasilar and right upper lobe airspace opacities likely to reflect changes of pneumonia and/or CHF. Low lung volumes with probable small left pleural effusion. Stable support line and tube positions. Electronically Signed   By: Ashley Royalty M.D.   On: 01/10/2017 23:02    Cardiac Studies   Echo 01/03/17: Study Conclusions  - Left ventricle: The cavity size was normal. Wall thickness was   normal. The estimated ejection fraction was in the range of 10%   to 15%. Diffuse hypokinesis. DIastolic function is abnormal,   indeterminant grade. - Aortic valve: There was moderate regurgitation. Valve area (VTI):   1.08 cm^2. Valve area (Vmax): 1.5 cm^2. Valve area (Vmean): 1.5    cm^2. - Mitral valve: There was moderate regurgitation. - Left atrium: The atrium was mildly dilated. - Right ventricle: Systolic function was mildly to moderately   reduced. - Tricuspid valve: There was moderate regurgitation. - Pulmonary arteries: Systolic pressure was moderately increased.   PA peak pressure: 49 mm Hg (S).  LHC/RHC 01/05/17: Prox RCA to Mid RCA lesion, 99 %stenosed.  Mid RCA lesion, 30 %stenosed.  Dist RCA lesion, 40 %stenosed.  RPDA lesion, 20 %stenosed.  Prox Cx lesion, 100 %stenosed.  Ost Ramus to Ramus lesion, 50 %stenosed.  Ost 3rd Diag to 3rd Diag lesion, 30 %stenosed.  Mid LAD lesion, 40 %stenosed.  Ost LAD to Prox LAD lesion, 20 %stenosed.  Ost LM to LM lesion, 40 %stenosed.  Hemodynamic findings consistent with mild pulmonary hypertension.   1. Severe calcific disease in the proximal to mid RCA 2. Chronic total occlusion of the proximal Circumflex  3. Moderate left main and mid LAD stenosis 4. Elevated filling pressures   Patient Profile     66 y.o. 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  Found to have severe 3 vessel CAD on cath.  Hospitalization complicated by aspiration pneumonia and MSSA bacteremia.  Assessment & Plan    # Acute systolic and diastolic heart failure:  Continue milrinone and carvedilol.  Will plan to wean after LHC. Hopefully transition to an ACE-I/ARB.  # Multivessel CAD: [name] is too weak for CABG.  We discussed the risks and benefits of cath.  He wishes to proceed with PCI of the RCA, likely tomorrow.  He will get a PEG tube today.  Continue aspirin, atorvastatin and carvedilol.  # MSSA bacteremia: [name] has persistent bacteremia and a port in place.  Will plan for TEE after LHC.  Continue antibiotics with ID guidance.     Signed, Skeet Latch, MD  01/12/2017, 8:39 AM

## 2017-01-12 NOTE — Care Management Note (Signed)
Case Management Note  Patient Details  Name: Collin Young MRN: [id number] Date of Birth: [birth year]  Subjective/Objective: Pt presented as a transfer from [place] for CHF. Pt continues on IV Milrinone and IV Ancef. Peg tube placement 01-12-17- LHC 01-13-17. Pt is from home.                    Action/Plan: CM to continue to monitor for disposition needs.   Expected Discharge Date:                  Expected Discharge Plan:  Home/Self Care  In-House Referral:     Discharge planning Services  CM Consult  Post Acute Care Choice:    Choice offered to:     DME Arranged:    DME Agency:     HH Arranged:    HH Agency:     Status of Service:  In process, will continue to follow  If discussed at Long Length of Stay Meetings, dates discussed:    Additional Comments:  Bethena Roys, RN 01/12/2017, 1:59 PM

## 2017-01-12 NOTE — Progress Notes (Signed)
IR follow up. Repeat Blood Cxs are negative so far. Pt is for heart cath with PCI of RCA tomorrow and will be on Plavix thereafter. Therefore, IR will tentatively plan to move fwd with G-tube tomorrow am as long as no high fever spikes.  Ascencion Dike PA-C Interventional Radiology 01/12/2017 3:48 PM

## 2017-01-13 ENCOUNTER — Encounter (HOSPITAL_COMMUNITY): Payer: Self-pay | Admitting: Interventional Radiology

## 2017-01-13 ENCOUNTER — Inpatient Hospital Stay (HOSPITAL_COMMUNITY): Payer: Medicare Other

## 2017-01-13 HISTORY — PX: IR GASTROSTOMY TUBE MOD SED: IMG625

## 2017-01-13 LAB — GLUCOSE, CAPILLARY
GLUCOSE-CAPILLARY: 114 mg/dL — AB (ref 65–99)
GLUCOSE-CAPILLARY: 104 mg/dL — AB (ref 65–99)
GLUCOSE-CAPILLARY: 93 mg/dL (ref 65–99)
Glucose-Capillary: 107 mg/dL — ABNORMAL HIGH (ref 65–99)
Glucose-Capillary: 83 mg/dL (ref 65–99)

## 2017-01-13 LAB — BASIC METABOLIC PANEL
Anion gap: 9 (ref 5–15)
BUN: 20 mg/dL (ref 6–20)
CO2: 26 mmol/L (ref 22–32)
Calcium: 8.2 mg/dL — ABNORMAL LOW (ref 8.9–10.3)
Chloride: 95 mmol/L — ABNORMAL LOW (ref 101–111)
Creatinine, Ser: 0.85 mg/dL (ref 0.61–1.24)
GFR calc Af Amer: >60 mL/min (ref >60)
GFR calc non Af Amer: >60 mL/min (ref >60)
Glucose, Bld: 113 mg/dL — ABNORMAL HIGH (ref 65–99)
Potassium: 4.7 mmol/L (ref 3.5–5.1)
Sodium: 130 mmol/L — ABNORMAL LOW (ref 135–145)

## 2017-01-13 LAB — CBC
HEMATOCRIT: 38.8 % — AB (ref 39.0–52.0)
HEMOGLOBIN: 12.6 g/dL — AB (ref 13.0–17.0)
MCH: 27.5 pg (ref 26.0–34.0)
MCHC: 32.5 g/dL (ref 30.0–36.0)
MCV: 84.5 fL (ref 78.0–100.0)
Platelets: 234 10*3/uL (ref 150–400)
RBC: 4.59 MIL/uL (ref 4.22–5.81)
RDW: 14.1 % (ref 11.5–15.5)
WBC: 14.6 10*3/uL — AB (ref 4.0–10.5)

## 2017-01-13 LAB — COOXEMETRY PANEL
Carboxyhemoglobin: 1.2 % (ref 0.5–1.5)
Methemoglobin: 0.8 % (ref 0.0–1.5)
O2 Saturation: 69.4 %
TOTAL HEMOGLOBIN: 13.1 g/dL (ref 12.0–16.0)

## 2017-01-13 MED ORDER — GLUCAGON HCL RDNA (DIAGNOSTIC) 1 MG IJ SOLR
INTRAMUSCULAR | Status: AC
  Filled 2017-01-13: qty 1

## 2017-01-13 MED ORDER — GLUCAGON HCL (RDNA) 1 MG IJ SOLR
INTRAMUSCULAR | Status: AC | PRN
  Administered 2017-01-13: .5 mg via INTRAVENOUS

## 2017-01-13 MED ORDER — FENTANYL CITRATE (PF) 100 MCG/2ML IJ SOLN
INTRAMUSCULAR | Status: AC
  Filled 2017-01-13: qty 4

## 2017-01-13 MED ORDER — MIDAZOLAM HCL 2 MG/2ML IJ SOLN
INTRAMUSCULAR | Status: AC | PRN
  Administered 2017-01-13 (×2): 1 mg via INTRAVENOUS

## 2017-01-13 MED ORDER — MIDAZOLAM HCL 2 MG/2ML IJ SOLN
INTRAMUSCULAR | Status: AC
  Filled 2017-01-13: qty 4

## 2017-01-13 MED ORDER — LIDOCAINE HCL 1 % IJ SOLN
INTRAMUSCULAR | Status: AC
  Filled 2017-01-13: qty 20

## 2017-01-13 MED ORDER — HYDROMORPHONE HCL 1 MG/ML IJ SOLN
1.0000 mg | INTRAMUSCULAR | Status: DC | PRN
  Administered 2017-01-13 – 2017-01-14 (×2): 1 mg via INTRAVENOUS
  Filled 2017-01-13 (×2): qty 1

## 2017-01-13 MED ORDER — HYDROCODONE-ACETAMINOPHEN 5-325 MG PO TABS: 1.0000 | ORAL_TABLET | ORAL | Status: DC | PRN

## 2017-01-13 MED ORDER — LIDOCAINE HCL 1 % IJ SOLN
INTRAMUSCULAR | Status: AC | PRN
  Administered 2017-01-13: 5 mL

## 2017-01-13 MED ORDER — IOPAMIDOL (ISOVUE-300) INJECTION 61%
INTRAVENOUS | Status: AC
Start: 2017-01-13 — End: 2017-01-13
  Administered 2017-01-13: 20 mL
  Filled 2017-01-13: qty 50

## 2017-01-13 MED ORDER — FENTANYL CITRATE (PF) 100 MCG/2ML IJ SOLN
INTRAMUSCULAR | Status: AC | PRN
  Administered 2017-01-13 (×2): 50 ug via INTRAVENOUS

## 2017-01-13 NOTE — Progress Notes (Signed)
[facility] for Infectious Disease    Date of Admission:  01/02/2017   Total days of antibiotics 7        Day 3 cefazolin           ID: Collin Young is a 66 y.o. male with  Hx of esophageal ca s/p chemo and radiation with portacath admitted for heart failure found to have 3V CAD requiring PCI but also found to have MSSA bacteremia and aspiration pneumonia Principal Problem:   Staphylococcus aureus bacteremia Active Problems:   Malignant neoplasm of pyriform sinus (HCC)   Acute on chronic combined systolic and diastolic CHF (congestive heart failure) (HCC)   AKI (acute kidney injury) ([place])   Acute pulmonary edema (HCC)   Congestive dilated cardiomyopathy (HCC)   Acute congestive heart failure (HCC)   Ischemic cardiomyopathy   Acute hypoxemic respiratory failure ([place])   Palliative care by specialist   Dysphagia   Aspiration pneumonia of both lower lobes due to gastric secretions ([place])   Encounter for feeding tube placement    Subjective: Remains afebrile, had PEG placed by IR Today.denies any abdominal pain. No diarrhea. Chills, nightsweats, or fevers.  Medications:  . aspirin EC  81 mg Oral Daily  . atorvastatin  20 mg Oral Daily  . budesonide (PULMICORT) nebulizer solution  0.5 mg Nebulization BID  . carvedilol  6.25 mg Oral BID WC  . fentaNYL      . glucagon (human recombinant)      . lidocaine      . midazolam      . multivitamin with minerals  1 tablet Oral Daily  . sodium chloride flush  3 mL Intravenous Q12H    Objective: Vital signs in last 24 hours: Temp:  [97.7 F (36.5 C)-99.3 F (37.4 C)] 99.3 F (37.4 C) (05/16 0736) Pulse Rate:  [90-109] 93 (05/16 1114) Resp:  [15-22] 15 (05/16 1114) BP: (125-154)/(67-95) 137/82 (05/16 1114) SpO2:  [93 %-99 %] 95 % (05/16 1114) FiO2 (%):  [22 %] 22 % (05/16 1035) Weight:  [124 lb 6.4 oz (56.4 kg)] 124 lb 6.4 oz (56.4 kg) (05/16 0438) Physical Exam  Constitutional: He is oriented to person, place, and time.  He appears well-developed and well-nourished. No distress.  HENT:  Mouth/Throat: Oropharynx is clear and moist. No oropharyngeal exudate.  Cardiovascular: Normal rate, regular rhythm and normal heart sounds. Exam reveals no gallop and no friction rub.  No murmur heard.  Pulmonary/Chest: Effort normal and breath sounds normal. No respiratory distress. He has no wheezes.  Chest wall- right sided port a cath no surrounding erythema Abdominal: Soft. Bowel sounds are normal. He exhibits no distension. There is no tenderness. Peg in place Neurological: He is alert and oriented to person, place, and time.  Skin: Skin is warm and dry. No rash noted. No erythema.  Psychiatric: He has a normal mood and affect. His behavior is normal.    Lab Results  Recent Labs  01/12/17 0446 01/13/17 0456  WBC 13.6* 14.6*  HGB 11.7* 12.6*  HCT 35.7* 38.8*  NA 130* 130*  K 3.9 4.7  CL 95* 95*  CO2 28 26  BUN 20 20  CREATININE 0.83 0.85    Microbiology: 5/14 blood cx NGTD 5/12 blood cx x 1 MSSA 5/10 blood cx 1 of 2 MSSA Studies/Results: Ct Abdomen Wo Contrast  Result Date: 01/12/2017 CLINICAL DATA:  Dysphagia, esophagitis, malnutrition and assessment for possible percutaneous gastrostomy tube placement. EXAM: CT ABDOMEN WITHOUT CONTRAST TECHNIQUE:  Multidetector CT imaging of the abdomen was performed following the standard protocol without IV contrast. COMPARISON:  None. FINDINGS: Lower chest: Dense consolidation of both lower lobes, left greater than right, with associated small bilateral pleural effusions. Hepatobiliary: Unenhanced appearance of the liver and gallbladder are unremarkable. Pancreas: Unremarkable unenhanced appearance. Spleen: Normal size.  Unremarkable appearance. Adrenals/Urinary Tract: No hydronephrosis. Stomach/Bowel: Feeding tube extends into the proximal stomach. The stomach is anteriorly positioned and superior to the transverse colon. Anatomy would not be prohibitive to percutaneous  gastrostomy. There is no evidence of bowel obstruction or significant ileus. No free air identified. Vascular/Lymphatic: No enlarged lymph nodes. Calcified aorta without evidence of aneurysm. Other: Anasarca.  No focal abnormal fluid collections identified. Musculoskeletal: Unremarkable. IMPRESSION: 1. Anatomy is not prohibitive to percutaneous gastrostomy tube placement. 2. Consolidation of both lower lobes, left greater than right with associated small bilateral pleural effusions. Electronically Signed   By: Aletta Edouard M.D.   On: 01/12/2017 08:01   Ir Gastrostomy Tube Mod Sed  Result Date: 01/13/2017 CLINICAL DATA:  Esophagitis. History of esophageal carcinoma. Dysphagia, malnutrition, needs enteral feeding support. EXAM: PERC PLACEMENT GASTROSTOMY FLUOROSCOPY TIME:  3 uGym2 DAP, 2 minutes 30 seconds TECHNIQUE: The procedure, risks, benefits, and alternatives were explained to the patient. Questions regarding the procedure were encouraged and answered. The patient understands and consents to the procedure. Patient was receiving adequate prophylactic antibiotic coverage.Progression of previously administered oral barium into the colon was confirmed fluoroscopically. A 5 French angiographic catheter was placed as orogastric tube. The upper abdomen was prepped with Betadine, draped in usual sterile fashion, and infiltrated locally with 1% lidocaine. Intravenous Fentanyl and Versed were administered as conscious sedation during continuous monitoring of the patient's level of consciousness and physiological / cardiorespiratory status by the radiology RN, with a total moderate sedation time of 11 minutes. Stomach was insufflated using air through the orogastric tube. 0.5 mg glucagon given IV to facilitate gastric distention. An 47 French sheath needle was advanced percutaneously into the gastric lumen under fluoroscopy. Gas could be aspirated and a small contrast injection confirmed intraluminal spread. The  sheath was exchanged over a guidewire for a 9 [place] vascular sheath, through which the snare device was advanced and used to snare a guidewire passed through the orogastric tube. This was withdrawn, and the snare attached to the 20 French pull-through gastrostomy tube, which was advanced antegrade, positioned with the internal bumper securing the anterior gastric wall to the anterior abdominal wall. Small contrast injection confirms appropriate positioning. The external bumper was applied and the catheter was flushed. COMPLICATIONS: COMPLICATIONS none IMPRESSION: 1. Technically successful 20 French pull-through gastrostomy placement under fluoroscopy. Electronically Signed   By: Lucrezia Europe M.D.   On: 01/13/2017 10:50     Assessment/Plan: Complicated MSSA bacteremia = recommend to get port a cath removal as this is likely colonized with MSSA and switch to getting picc line for the remaining course of his IV abtx. Since he is unable to get TEE due to prior surgery and radiation will do longer course of treatment  Recommend to treat for 4 wk. Using the day of removal of portacath as a day 1 of 28.   Baxter Flattery [facility] Infectious Diseases Cell: [phone] Pager: [phone]  01/13/2017, 2:44 PM

## 2017-01-13 NOTE — Sedation Documentation (Signed)
Patient denies pain and is resting comfortably.  

## 2017-01-13 NOTE — Progress Notes (Signed)
Mr. Eagon case was discussed with our interventional team.  His RCA intervention would be technically challenging and would likely require hemodynamic support given his low LVEF.  Given his frailty and lack of ischemia symptoms, we will pursue medical management unless he develops chest pain.  We will try to wean milrinone tonight.  Hopefully his BP will tolerate titration of medical therapy, starting tomorrow.  Plan for close outpatient follow up at discharge and we will consider PCI once more stable and his bacteremia has cleared.  Maisie Hauser C. Oval Linsey, MD, [facility] 01/13/2017 5:37 PM

## 2017-01-13 NOTE — Plan of Care (Signed)
Problem: Activity: Goal: Risk for activity intolerance will decrease Outcome: Progressing Patient can get to the chair with 1 person assist, will use call bell and call for assistance hen wanting to get up.

## 2017-01-13 NOTE — Sedation Documentation (Signed)
Patient is resting comfortably. 

## 2017-01-13 NOTE — Progress Notes (Signed)
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 He presented to the emergency department complaining of progressive shortness of breath for the past several weeks. He was admitted with concerns of CHF, with BNP significantly elevated at 2000. Echocardiogram revealed ejection fraction of 10-15% patient was started on beta blockers Lasix and Aldactone. Patient also on admission was found to be on acute kidney injury. Cardiology consulted, underwent cardiac cath showing significant multivessel disease. Recommended staged PCI when creatinine improves. Dysphagia was evaluated with recommendation per SLP for total NPO status, NG tube and reevaluation once clinical status improved. Blood cultures have been positive for MSSA and ID is making recommendations. Palliative care has been consulted and after family meeting, patient is DNR. PEG tube placed 5/16. Repeat blood cultures from 5/12 following initiation of effective abx grew MSSA again in ~48 hrs. Repeat cultures were drawn 5/14. PCI is planned 5/17. He remains afebrile, though WBC has been rising mildly.  Subjective: No dyspnea or chest pain. No complaints after PEG placement.  Assessment & Plan: Acute respiratory failure with hypoxia: Due to ischemic cardiomyopathy/acute systolic CHF, pulmonary edema, and likely aspiration pneumonitis/pneumonia. CXR show worsening air space disease, pneumonia vs pulm edema, he is afebrile and no leukocytosis.  - Resolved - Continue nebs per pulmonology - Continue antibiotics as below - Sputum culture sent, not returned yet.  Acute systolic heart failure: New diagnosis, due to multivessel CAD.  - NTG gtt for  pulmonary edema. Holding lasix. ACE and aldactone on hold due to AKI   - Daily weight, I&O's low salt diet. Weights are creeping up as well, 120lbs > 124lbs.  - Milrinone per cardiology, hopeful to wean after LHC. Doubt he would do well at home with this.   Ischemic cardiomyopathy: Left main and LAD with moderate disease and chronically occluded LCx in addition to 99% stenosis of RCA thought to be amenable to atherectomy/staged PCI pending renal function.  - ASA, statin, plavix. - LHC planned per cardiology 5/17.    Severe aspiration: Related to h/o radiation to esophagus, MBSS 5/11 demonstrated frank aspiration of all consistencies. Cortrak was placed as bridge to PEG, placed 5/16.  - Nutrition consulted.   MSSA bacteremia: in 1 of 2 blood cultures from 5/10 and again on 5/12. PCT improving 10 > 2. 5/14 Blood cultures NGTD - ID following, on ancef. ?Rising WBC possibly needs further coverage for aspiration - Functional status insufficient for TEE. Could consider pulling port vs. empirically treating with long duration.   AKI: Suspect cardiorenal syndrome vs from contrast vs diuretics. Poor CO leading to poor kidney perfusion. FENa 0.64% suggestive of prerenal etiology. - Monitor daily, resolved  Complex left renal cyst: Warrants additional surveillance with MRI or CT with contrast will defer after patient stable given patient will receive multiple contrast doses for cardiac cath. This can be follow as outpatient as well.   Enlarged prostate: Seen on U/S. No LUTS/BOO. PSA normal.  - No medications given lack of symptoms.   Hypertension: Per patient was taken off his BP meds by PCP PTA  - Continue coreg and NTG gtt as tolerated   Squamous cell CA of the left piriform sinus and esophagus: In remission  - Continuing home fentanyl patch.  Hyponatremia: Suspect hypervolemia, improving, mild. - Monitor  DVT prophylaxis: Lovenox  Code Status: Full Family Communication: Daughter at bedside  this AM Disposition Plan: Continue SDU level of care given milrinone, NTG drip titration. [place] 5/17  Consultants:   Cardiology   PCCM  ID   Palliative care  IR  Procedures:   ECHO 01/03/17 - Left ventricle: The cavity size was normal. Wall thickness was   normal. The estimated ejection fraction was in the range of 10%   to 15%. Diffuse hypokinesis. DIastolic function is abnormal,   indeterminant grade. - Aortic valve: There was moderate regurgitation. Valve area (VTI):   1.08 cm^2. Valve area (Vmax): 1.5 cm^2. Valve area (Vmean): 1.5   cm^2. - Mitral valve: There was moderate regurgitation. - Left atrium: The atrium was mildly dilated. - Right ventricle: Systolic function was mildly to moderately   reduced. - Tricuspid valve: There was moderate regurgitation. - Pulmonary arteries: Systolic pressure was moderately increased.   PA peak pressure: 49 mm Hg (S).  Right/Left Heart Cath and Coronary Angiography 01/05/2017     Prox RCA to Mid RCA lesion, 99 %stenosed.  Mid RCA lesion, 30 %stenosed.  Dist RCA lesion, 40 %stenosed.  RPDA lesion, 20 %stenosed.  Prox Cx lesion, 100 %stenosed.  Ost Ramus to Ramus lesion, 50 %stenosed.  Ost 3rd Diag to 3rd Diag lesion, 30 %stenosed.  Mid LAD lesion, 40 %stenosed.  Ost LAD to Prox LAD lesion, 20 %stenosed.  Ost LM to LM lesion, 40 %stenosed.  Hemodynamic findings consistent with mild pulmonary hypertension.  1. Severe calcific disease in the proximal to mid RCA 2. 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 Moderate left main and mid LAD stenosis 4. Elevated filling pressures  Recommendations: He has multivessel CAD. I think the RCA can be approached with PCI but will likely require atherectomy prior to stenting. I would treat the remainder of his CAD medically. Will stage PCI given renal insufficiency. Hydrate gently tonight. May need diuresis tomorrow given elevated filling pressures. Would anticipate PCI on  Thursday or Friday of this week if renal function remains stable.    PEG Placement 01/13/2017  Antimicrobials: Vancomycin 5/10 > 5/12 Cefepime 5/12 > 5/14  Ancef 5/14 >>   Objective: Vitals:   01/13/17 1035 01/13/17 1045 01/13/17 1055 01/13/17 1114  BP:  (!) 144/83 132/84 137/82  Pulse: 98 99 94 93  Resp: 16 16 16 15   Temp:      TempSrc:      SpO2: 96%  96% 95%  Weight:      Height:        Intake/Output Summary (Last 24 hours) at 01/13/17 1155 Last data filed at 01/13/17 0736  Gross per 24 hour  Intake           1060.3 ml  Output              450 ml  Net            610.3 ml   Filed Weights   01/11/17 0541 01/12/17 0951 01/13/17 0438  Weight: 55.6 kg (122 lb 8 oz) 56.6 kg (124 lb 12.8 oz) 56.4 kg (124 lb 6.4 oz)    Examination: General exam: Thin male in no distress, high spirits. Respiratory system: No accessory muscle use on room air. No wheezes or crackles this AM. Cardiovascular system: Regular, no murmur, no JVD   Gastrointestinal system: Abd Soft NTND, +BS. PEG in situ LUQ nontender, no discharge. Extremities: No LE edema  Skin: No lesions or rash. Right upper chest port site c/d/i without erythema.  Data Reviewed: I have personally reviewed following labs and imaging studies  CBC:  Recent Labs Lab 01/08/17 0410 01/10/17 0500 01/11/17 0523 01/12/17 0446 01/13/17 0456  WBC 8.1 10.7* 12.6* 13.6* 14.6*  HGB 11.5* 11.5* 10.9* 11.7* 12.6*  HCT 34.8* 35.2* 33.3* 35.7* 38.8*  MCV 84.7 86.1 86.5 86.4 84.5  PLT 144* 174 172 193 607   Basic Metabolic Panel:  Recent Labs Lab 01/07/17 2035 01/08/17 0410 01/09/17 0451 01/10/17 0500 01/11/17 0523 01/12/17 0446 01/13/17 0456  NA  --  125* 128* 130* 133* 130* 130*  K  --  4.4 3.9 3.8 4.0 3.9 4.7  CL  --  86* 88* 92* 96* 95* 95*  CO2  --  27 32 33* 29 28 26   GLUCOSE  --  91 122* 154* 127* 153* 113*  BUN  --  66* 47* 30* 24* 20 20  CREATININE  --  2.02* 1.33* 1.15 0.91 0.83 0.85  CALCIUM  --  8.3* 8.0*  7.9* 7.4* 7.8* 8.2*  MG 2.5* 2.7*  --   --   --   --   --   PHOS 2.8 3.5  --   --   --   --   --    GFR: Estimated Creatinine Clearance: 68.2 mL/min (by C-G formula based on SCr of 0.85 mg/dL). Liver Function Tests: No results for input(s): AST, ALT, ALKPHOS, BILITOT, PROT, ALBUMIN in the last 168 hours. Recent Results (from the past 240 hour(s))  Culture, blood (Routine X 2) w Reflex to ID Panel     Status: None   Collection Time: 01/07/17  3:43 PM  Result Value Ref Range Status   Specimen Description BLOOD RIGHT ANTECUBITAL  Final   Special Requests IN PEDIATRIC BOTTLE Blood Culture adequate volume  Final   Culture NO GROWTH 5 DAYS  Final   Report Status 01/12/2017 FINAL  Final  Culture, blood (Routine X 2) w Reflex to ID Panel     Status: Abnormal   Collection Time: 01/07/17  3:47 PM  Result Value Ref Range Status   Specimen Description BLOOD RIGHT HAND  Final   Special Requests IN PEDIATRIC BOTTLE Blood Culture adequate volume  Final   Culture  Setup Time   Final    GRAM POSITIVE COCCI IN CLUSTERS IN PEDIATRIC BOTTLE CRITICAL RESULT CALLED TO, READ BACK BY AND VERIFIED WITH: Leonie Green Pharm.D. 13:30 01/08/17 ([email or web address])    Culture STAPHYLOCOCCUS AUREUS (A)  Final   Report Status 01/10/2017 FINAL  Final   Organism ID, Bacteria STAPHYLOCOCCUS AUREUS  Final      Susceptibility   Staphylococcus aureus - MIC*    CIPROFLOXACIN <=0.5 SENSITIVE Sensitive     ERYTHROMYCIN <=0.25 SENSITIVE Sensitive     GENTAMICIN <=0.5 SENSITIVE Sensitive     OXACILLIN 0.5 SENSITIVE Sensitive     TETRACYCLINE <=1 SENSITIVE Sensitive     VANCOMYCIN <=0.5 SENSITIVE Sensitive     TRIMETH/SULFA <=10 SENSITIVE Sensitive     CLINDAMYCIN <=0.25 SENSITIVE Sensitive     RIFAMPIN <=0.5 SENSITIVE Sensitive     Inducible Clindamycin NEGATIVE Sensitive     * STAPHYLOCOCCUS AUREUS  Blood Culture ID Panel (Reflexed)     Status: Abnormal   Collection Time: 01/07/17  3:47 PM  Result Value Ref Range Status    Enterococcus species NOT DETECTED NOT DETECTED Final   Listeria monocytogenes NOT DETECTED NOT DETECTED Final   Staphylococcus species  DETECTED (A) NOT DETECTED Final    Comment: CRITICAL RESULT CALLED TO, READ BACK BY AND VERIFIED WITH: M. Radford Pax Pharm.D. 13:30 01/08/17 ([email or web address])    Staphylococcus aureus DETECTED (A) NOT DETECTED Final    Comment: Methicillin (oxacillin) susceptible Staphylococcus aureus (MSSA). Preferred therapy is anti staphylococcal beta lactam antibiotic (Cefazolin or Nafcillin), unless clinically contraindicated. CRITICAL RESULT CALLED TO, READ BACK BY AND VERIFIED WITH: M. Radford Pax Pharm.D. 13:30 01/08/17 ([email or web address])    Methicillin resistance NOT DETECTED NOT DETECTED Final   Streptococcus species NOT DETECTED NOT DETECTED Final   Streptococcus agalactiae NOT DETECTED NOT DETECTED Final   Streptococcus pneumoniae NOT DETECTED NOT DETECTED Final   Streptococcus pyogenes NOT DETECTED NOT DETECTED Final   Acinetobacter baumannii NOT DETECTED NOT DETECTED Final   Enterobacteriaceae species NOT DETECTED NOT DETECTED Final   Enterobacter cloacae complex NOT DETECTED NOT DETECTED Final   Escherichia coli NOT DETECTED NOT DETECTED Final   Klebsiella oxytoca NOT DETECTED NOT DETECTED Final   Klebsiella pneumoniae NOT DETECTED NOT DETECTED Final   Proteus species NOT DETECTED NOT DETECTED Final   Serratia marcescens NOT DETECTED NOT DETECTED Final   Haemophilus influenzae NOT DETECTED NOT DETECTED Final   Neisseria meningitidis NOT DETECTED NOT DETECTED Final   Pseudomonas aeruginosa NOT DETECTED NOT DETECTED Final   Candida albicans NOT DETECTED NOT DETECTED Final   Candida glabrata NOT DETECTED NOT DETECTED Final   Candida krusei NOT DETECTED NOT DETECTED Final   Candida parapsilosis NOT DETECTED NOT DETECTED Final   Candida tropicalis NOT DETECTED NOT DETECTED Final  MRSA PCR Screening     Status: None   Collection Time: 01/07/17  5:21 PM  Result Value Ref Range Status    MRSA by PCR NEGATIVE NEGATIVE Final    Comment:        The GeneXpert MRSA Assay (FDA approved for NASAL specimens only), is one component of a comprehensive MRSA colonization surveillance program. It is not intended to diagnose MRSA infection nor to guide or monitor treatment for MRSA infections.   Culture, blood (Routine X 2) w Reflex to ID Panel     Status: Abnormal   Collection Time: 01/09/17 10:15 AM  Result Value Ref Range Status   Specimen Description BLOOD RIGHT HAND  Final   Special Requests   Final    Blood Culture results may not be optimal due to an inadequate volume of blood received in culture bottles   Culture  Setup Time   Final    GRAM POSITIVE COCCI IN CLUSTERS Organism ID to follow IN PEDIATRIC BOTTLE CRITICAL RESULT CALLED TO, READ BACK BY AND VERIFIED WITH: N. JOHNSTON PHARMD, AT 0848 01/10/17 BY D. VANHOOK    Culture STAPHYLOCOCCUS AUREUS (A)  Final   Report Status 01/12/2017 FINAL  Final   Organism ID, Bacteria STAPHYLOCOCCUS AUREUS  Final      Susceptibility   Staphylococcus aureus - MIC*    CIPROFLOXACIN <=0.5 SENSITIVE Sensitive     ERYTHROMYCIN <=0.25 SENSITIVE Sensitive     GENTAMICIN <=0.5 SENSITIVE Sensitive     OXACILLIN <=0.25 SENSITIVE Sensitive     TETRACYCLINE <=1 SENSITIVE Sensitive     VANCOMYCIN <=0.5 SENSITIVE Sensitive     TRIMETH/SULFA <=10 SENSITIVE Sensitive     CLINDAMYCIN <=0.25 SENSITIVE Sensitive     RIFAMPIN <=0.5 SENSITIVE Sensitive     Inducible Clindamycin NEGATIVE Sensitive     * STAPHYLOCOCCUS AUREUS  Blood Culture ID Panel (Reflexed)     Status: Abnormal   Collection Time: 01/09/17 10:15  AM  Result Value Ref Range Status   Enterococcus species NOT DETECTED NOT DETECTED Final   Listeria monocytogenes NOT DETECTED NOT DETECTED Final   Staphylococcus species DETECTED (A) NOT DETECTED Final    Comment: CRITICAL RESULT CALLED TO, READ BACK BY AND VERIFIED WITH: V. JOHNSTON PHARMD, AT 0848 01/10/17 BY D. VANHOOK     Staphylococcus aureus DETECTED (A) NOT DETECTED Final    Comment: Methicillin (oxacillin) susceptible Staphylococcus aureus (MSSA). Preferred therapy is anti staphylococcal beta lactam antibiotic (Cefazolin or Nafcillin), unless clinically contraindicated. CRITICAL RESULT CALLED TO, READ BACK BY AND VERIFIED WITH: V. JOHNSTON PHARMD, AT 0848 01/10/17 BY D. VANHOOK    Methicillin resistance NOT DETECTED NOT DETECTED Final   Streptococcus species NOT DETECTED NOT DETECTED Final   Streptococcus agalactiae NOT DETECTED NOT DETECTED Final   Streptococcus pneumoniae NOT DETECTED NOT DETECTED Final   Streptococcus pyogenes NOT DETECTED NOT DETECTED Final   Acinetobacter baumannii NOT DETECTED NOT DETECTED Final   Enterobacteriaceae species NOT DETECTED NOT DETECTED Final   Enterobacter cloacae complex NOT DETECTED NOT DETECTED Final   Escherichia coli NOT DETECTED NOT DETECTED Final   Klebsiella oxytoca NOT DETECTED NOT DETECTED Final   Klebsiella pneumoniae NOT DETECTED NOT DETECTED Final   Proteus species NOT DETECTED NOT DETECTED Final   Serratia marcescens NOT DETECTED NOT DETECTED Final   Haemophilus influenzae NOT DETECTED NOT DETECTED Final   Neisseria meningitidis NOT DETECTED NOT DETECTED Final   Pseudomonas aeruginosa NOT DETECTED NOT DETECTED Final   Candida albicans NOT DETECTED NOT DETECTED Final   Candida glabrata NOT DETECTED NOT DETECTED Final   Candida krusei NOT DETECTED NOT DETECTED Final   Candida parapsilosis NOT DETECTED NOT DETECTED Final   Candida tropicalis NOT DETECTED NOT DETECTED Final  Culture, blood (routine x 2)     Status: None (Preliminary result)   Collection Time: 01/11/17  2:58 PM  Result Value Ref Range Status   Specimen Description BLOOD RIGHT ANTECUBITAL  Final   Special Requests   Final    BOTTLES DRAWN AEROBIC ONLY Blood Culture adequate volume   Culture NO GROWTH < 24 HOURS  Final   Report Status PENDING  Incomplete  Culture, blood (routine x 2)      Status: None (Preliminary result)   Collection Time: 01/11/17  3:02 PM  Result Value Ref Range Status   Specimen Description BLOOD RIGHT HAND  Final   Special Requests   Final    BOTTLES DRAWN AEROBIC ONLY Blood Culture adequate volume   Culture NO GROWTH < 24 HOURS  Final   Report Status PENDING  Incomplete      Radiology Studies: Ct Abdomen Wo Contrast  Result Date: 01/12/2017 CLINICAL DATA:  Dysphagia, esophagitis, malnutrition and assessment for possible percutaneous gastrostomy tube placement. EXAM: CT ABDOMEN WITHOUT CONTRAST TECHNIQUE: Multidetector CT imaging of the abdomen was performed following the standard protocol without IV contrast. COMPARISON:  None. FINDINGS: Lower chest: Dense consolidation of both lower lobes, left greater than right, with associated small bilateral pleural effusions. Hepatobiliary: Unenhanced appearance of the liver and gallbladder are unremarkable. Pancreas: Unremarkable unenhanced appearance. Spleen: Normal size.  Unremarkable appearance. Adrenals/Urinary Tract: No hydronephrosis. Stomach/Bowel: Feeding tube extends into the proximal stomach. The stomach is anteriorly positioned and superior to the transverse colon. Anatomy would not be prohibitive to percutaneous gastrostomy. There is no evidence of bowel obstruction or significant ileus. No free air identified. Vascular/Lymphatic: No enlarged lymph nodes. Calcified aorta without evidence of aneurysm. Other: Anasarca.  No focal  abnormal fluid collections identified. Musculoskeletal: Unremarkable. IMPRESSION: 1. Anatomy is not prohibitive to percutaneous gastrostomy tube placement. 2. Consolidation of both lower lobes, left greater than right with associated small bilateral pleural effusions. Electronically Signed   By: Aletta Edouard M.D.   On: 01/12/2017 08:01   Ir Gastrostomy Tube Mod Sed  Result Date: 01/13/2017 CLINICAL DATA:  Esophagitis. History of esophageal carcinoma. Dysphagia, malnutrition, needs  enteral feeding support. EXAM: PERC PLACEMENT GASTROSTOMY FLUOROSCOPY TIME:  92 uGym2 DAP, 2 minutes 30 seconds TECHNIQUE: The procedure, risks, benefits, and alternatives were explained to the patient. Questions regarding the procedure were encouraged and answered. The patient understands and consents to the procedure. Patient was receiving adequate prophylactic antibiotic coverage.Progression of previously administered oral barium into the colon was confirmed fluoroscopically. A 5 French angiographic catheter was placed as orogastric tube. The upper abdomen was prepped with Betadine, draped in usual sterile fashion, and infiltrated locally with 1% lidocaine. Intravenous Fentanyl and Versed were administered as conscious sedation during continuous monitoring of the patient's level of consciousness and physiological / cardiorespiratory status by the radiology RN, with a total moderate sedation time of 11 minutes. Stomach was insufflated using air through the orogastric tube. 0.5 mg glucagon given IV to facilitate gastric distention. An 77 French sheath needle was advanced percutaneously into the gastric lumen under fluoroscopy. Gas could be aspirated and a small contrast injection confirmed intraluminal spread. The sheath was exchanged over a guidewire for a 9 [place] vascular sheath, through which the snare device was advanced and used to snare a guidewire passed through the orogastric tube. This was withdrawn, and the snare attached to the 20 French pull-through gastrostomy tube, which was advanced antegrade, positioned with the internal bumper securing the anterior gastric wall to the anterior abdominal wall. Small contrast injection confirms appropriate positioning. The external bumper was applied and the catheter was flushed. COMPLICATIONS: COMPLICATIONS none IMPRESSION: 1. Technically successful 20 French pull-through gastrostomy placement under fluoroscopy. Electronically Signed   By: Lucrezia Europe M.D.   On:  01/13/2017 10:50   Scheduled Meds: . aspirin EC  81 mg Oral Daily  . atorvastatin  20 mg Oral Daily  . budesonide (PULMICORT) nebulizer solution  0.5 mg Nebulization BID  . carvedilol  6.25 mg Oral BID WC  . fentaNYL      . glucagon (human recombinant)      . lidocaine      . midazolam      . multivitamin with minerals  1 tablet Oral Daily  . sodium chloride flush  3 mL Intravenous Q12H   Continuous Infusions: . sodium chloride 250 mL (01/11/17 0800)  .  ceFAZolin (ANCEF) IV Stopped (01/13/17 0553)  . feeding supplement (JEVITY 1.2 CAL) 1,000 mL (01/12/17 1454)  . fluconazole (DIFLUCAN) IV Stopped (01/12/17 1828)  . milrinone 0.25 mcg/kg/min (01/13/17 1000)  . nitroGLYCERIN 8 mcg/min (01/13/17 1000)     LOS: 11 days   Vance Gather, MD Pager: Text Page via [email or web address]  [phone]   If 7PM-7AM, please contact night-coverage [email or web address] Password [email or web address] 01/13/2017, 11:55 AM

## 2017-01-13 NOTE — Progress Notes (Signed)
Patient refuses BiPAP QHS.

## 2017-01-13 NOTE — Progress Notes (Signed)
Progress Note  Patient Name: Collin Young Date of Encounter: 01/13/2017  Primary Cardiologist: Reola Calkins [place])  Subjective   Feeling well.  Denies chest pain or shortness of breath.   Inpatient Medications    Scheduled Meds: . aspirin EC  81 mg Oral Daily  . atorvastatin  20 mg Oral Daily  . budesonide (PULMICORT) nebulizer solution  0.5 mg Nebulization BID  . carvedilol  6.25 mg Oral BID WC  . multivitamin with minerals  1 tablet Oral Daily  . sodium chloride flush  3 mL Intravenous Q12H   Continuous Infusions: . sodium chloride 250 mL (01/11/17 0800)  .  ceFAZolin (ANCEF) IV Stopped (01/13/17 0553)  . feeding supplement (JEVITY 1.2 CAL) 1,000 mL (01/12/17 1454)  . fluconazole (DIFLUCAN) IV Stopped (01/12/17 1828)  . milrinone 0.25 mcg/kg/min (01/12/17 1432)  . nitroGLYCERIN 16.667 mcg/min (01/11/17 2043)   PRN Meds: sodium chloride, acetaminophen, guaiFENesin-dextromethorphan, ipratropium-albuterol, lip balm, MUSCLE RUB, ondansetron (ZOFRAN) IV, phenol, polyethylene glycol, polyvinyl alcohol, sodium chloride, sodium chloride flush, sodium chloride flush, white petrolatum   Vital Signs    Vitals:   01/13/17 0600 01/13/17 0736 01/13/17 0756 01/13/17 0937  BP:  (!) 139/93 (!) 143/80 135/74  Pulse: 98 92 94 90  Resp: 20 18 18 16   Temp:  99.3 F (37.4 C)    TempSrc:  Oral    SpO2: 93% 94% 98% 94%  Weight:      Height:        Intake/Output Summary (Last 24 hours) at 01/13/17 0949 Last data filed at 01/13/17 0736  Gross per 24 hour  Intake          1105.08 ml  Output              450 ml  Net           655.08 ml   Filed Weights   01/11/17 0541 01/12/17 0951 01/13/17 0438  Weight: 55.6 kg (122 lb 8 oz) 56.6 kg (124 lb 12.8 oz) 56.4 kg (124 lb 6.4 oz)    Telemetry    Sinus rhythm.  PVCs - Personally Reviewed  ECG    n/a - Personally Reviewed  Physical Exam   GEN: Frial, elderly man in Nno acute distress.  NG tube in R nare. Neck: No JVD  Cardiac:  RRR, no murmurs, rubs, or gallops.  Respiratory: Bilateral rhonchi.  No crackles or wheezes. GI: Soft, nontender, non-distended  MS: No edema;  WWP.  No deformity. Neuro:  Nonfocal  Psych: Normal affect   Labs    Chemistry  Recent Labs Lab 01/11/17 0523 01/12/17 0446 01/13/17 0456  NA 133* 130* 130*  K 4.0 3.9 4.7  CL 96* 95* 95*  CO2 29 28 26   GLUCOSE 127* 153* 113*  BUN 24* 20 20  CREATININE 0.91 0.83 0.85  CALCIUM 7.4* 7.8* 8.2*  GFRNONAA >60 >60 >60  GFRAA >60 >60 >60  ANIONGAP 8 7 9      Hematology  Recent Labs Lab 01/11/17 0523 01/12/17 0446 01/13/17 0456  WBC 12.6* 13.6* 14.6*  RBC 3.85* 4.13* 4.59  HGB 10.9* 11.7* 12.6*  HCT 33.3* 35.7* 38.8*  MCV 86.5 86.4 84.5  MCH 28.3 28.3 27.5  MCHC 32.7 32.8 32.5  RDW 14.1 14.2 14.1  PLT 172 193 234    Cardiac EnzymesNo results for input(s): TROPONINI in the last 168 hours. No results for input(s): TROPIPOC in the last 168 hours.   BNPNo results for input(s): BNP, PROBNP in the last  168 hours.   DDimer No results for input(s): DDIMER in the last 168 hours.   Radiology    Ct Abdomen Wo Contrast  Result Date: 01/12/2017 CLINICAL DATA:  Dysphagia, esophagitis, malnutrition and assessment for possible percutaneous gastrostomy tube placement. EXAM: CT ABDOMEN WITHOUT CONTRAST TECHNIQUE: Multidetector CT imaging of the abdomen was performed following the standard protocol without IV contrast. COMPARISON:  None. FINDINGS: Lower chest: Dense consolidation of both lower lobes, left greater than right, with associated small bilateral pleural effusions. Hepatobiliary: Unenhanced appearance of the liver and gallbladder are unremarkable. Pancreas: Unremarkable unenhanced appearance. Spleen: Normal size.  Unremarkable appearance. Adrenals/Urinary Tract: No hydronephrosis. Stomach/Bowel: Feeding tube extends into the proximal stomach. The stomach is anteriorly positioned and superior to the transverse colon. Anatomy would not be  prohibitive to percutaneous gastrostomy. There is no evidence of bowel obstruction or significant ileus. No free air identified. Vascular/Lymphatic: No enlarged lymph nodes. Calcified aorta without evidence of aneurysm. Other: Anasarca.  No focal abnormal fluid collections identified. Musculoskeletal: Unremarkable. IMPRESSION: 1. Anatomy is not prohibitive to percutaneous gastrostomy tube placement. 2. Consolidation of both lower lobes, left greater than right with associated small bilateral pleural effusions. Electronically Signed   By: Aletta Edouard M.D.   On: 01/12/2017 08:01    Cardiac Studies   Echo 01/03/17: Study Conclusions  - Left ventricle: The cavity size was normal. Wall thickness was   normal. The estimated ejection fraction was in the range of 10%   to 15%. Diffuse hypokinesis. Diastolic function is abnormal,   indeterminant grade. - Aortic valve: There was moderate regurgitation. Valve area (VTI):   1.08 cm^2. Valve area (Vmax): 1.5 cm^2. Valve area (Vmean): 1.5   cm^2. - Mitral valve: There was moderate regurgitation. - Left atrium: The atrium was mildly dilated. - Right ventricle: Systolic function was mildly to moderately   reduced. - Tricuspid valve: There was moderate regurgitation. - Pulmonary arteries: Systolic pressure was moderately increased.   PA peak pressure: 49 mm Hg (S).  LHC/RHC 01/05/17: Prox RCA to Mid RCA lesion, 99 %stenosed.  Mid RCA lesion, 30 %stenosed.  Dist RCA lesion, 40 %stenosed.  RPDA lesion, 20 %stenosed.  Prox Cx lesion, 100 %stenosed.  Ost Ramus to Ramus lesion, 50 %stenosed.  Ost 3rd Diag to 3rd Diag lesion, 30 %stenosed.  Mid LAD lesion, 40 %stenosed.  Ost LAD to Prox LAD lesion, 20 %stenosed.  Ost LM to LM lesion, 40 %stenosed.  Hemodynamic findings consistent with mild pulmonary hypertension.   1. Severe calcific disease in the proximal to mid RCA 2. 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 Moderate left  main and mid LAD stenosis 4. Elevated filling pressures   Patient Profile     66 y.o. 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  Found to have severe 3 vessel CAD on cath.  Hospitalization complicated by aspiration pneumonia and MSSA bacteremia.  Assessment & Plan    # Acute systolic and diastolic heart failure:  Continue milrinone and carvedilol.  Will plan to wean after LHC so that he has BP room for sedation. Hopefully transition to an ACE-I/ARB.  # Multivessel CAD: [name] is too frail for CABG.  We discussed the risks and benefits of cath.  He wishes to proceed with PCI of the RCA.  He will be going for PEG tube placement today.  We will plan for [facility] tomorrow.  Continue aspirin, atorvastatin and carvedilol.  # MSSA bacteremia: Unfortunately TEE is not an option given his prior surgery and XRT with frank aspiration and failed swallow evaluation.  Will have to manage empirically.   Signed, Skeet Latch, MD  01/13/2017, 9:49 AM

## 2017-01-13 NOTE — Procedures (Signed)
20F gastrostomy tube placement under fluoro No complication No blood loss. See complete dictation in [facility] PACS.  

## 2017-01-14 ENCOUNTER — Inpatient Hospital Stay (HOSPITAL_COMMUNITY): Payer: Medicare Other

## 2017-01-14 ENCOUNTER — Encounter (HOSPITAL_COMMUNITY)
Admission: EM | Disposition: A | Discharge status: Home Health | Payer: Self-pay | Source: Home / Self Care | Attending: Family Medicine

## 2017-01-14 ENCOUNTER — Other Ambulatory Visit (HOSPITAL_COMMUNITY): Payer: Self-pay

## 2017-01-14 DIAGNOSIS — I25708 Atherosclerosis of coronary artery bypass graft(s), unspecified, with other forms of angina pectoris: Secondary | ICD-10-CM

## 2017-01-14 DIAGNOSIS — I208 Other forms of angina pectoris: Secondary | ICD-10-CM

## 2017-01-14 DIAGNOSIS — R1310 Dysphagia, unspecified: Secondary | ICD-10-CM

## 2017-01-14 LAB — BASIC METABOLIC PANEL
ANION GAP: 10 (ref 5–15)
BUN: 20 mg/dL (ref 6–20)
CO2: 24 mmol/L (ref 22–32)
Calcium: 7.4 mg/dL — ABNORMAL LOW (ref 8.9–10.3)
Chloride: 99 mmol/L — ABNORMAL LOW (ref 101–111)
Creatinine, Ser: 0.98 mg/dL (ref 0.61–1.24)
GLUCOSE: 77 mg/dL (ref 65–99)
POTASSIUM: 4.6 mmol/L (ref 3.5–5.1)
SODIUM: 133 mmol/L — AB (ref 135–145)

## 2017-01-14 LAB — GLUCOSE, CAPILLARY
GLUCOSE-CAPILLARY: 96 mg/dL (ref 65–99)
GLUCOSE-CAPILLARY: 97 mg/dL (ref 65–99)
Glucose-Capillary: 138 mg/dL — ABNORMAL HIGH (ref 65–99)
Glucose-Capillary: 139 mg/dL — ABNORMAL HIGH (ref 65–99)
Glucose-Capillary: 76 mg/dL (ref 65–99)
Glucose-Capillary: 98 mg/dL (ref 65–99)

## 2017-01-14 LAB — COOXEMETRY PANEL
Carboxyhemoglobin: 1.1 % (ref 0.5–1.5)
Methemoglobin: 0.8 % (ref 0.0–1.5)
O2 Saturation: 57.9 %
Total hemoglobin: 13 g/dL (ref 12.0–16.0)

## 2017-01-14 LAB — CBC
HEMATOCRIT: 38.1 % — AB (ref 39.0–52.0)
HEMOGLOBIN: 12.5 g/dL — AB (ref 13.0–17.0)
MCH: 28 pg (ref 26.0–34.0)
MCHC: 32.8 g/dL (ref 30.0–36.0)
MCV: 85.2 fL (ref 78.0–100.0)
Platelets: 283 10*3/uL (ref 150–400)
RBC: 4.47 MIL/uL (ref 4.22–5.81)
RDW: 14.4 % (ref 11.5–15.5)
WBC: 13.7 10*3/uL — ABNORMAL HIGH (ref 4.0–10.5)

## 2017-01-14 LAB — BRAIN NATRIURETIC PEPTIDE: B Natriuretic Peptide: >4500 pg/mL — ABNORMAL HIGH (ref 0.0–100.0)

## 2017-01-14 LAB — TROPONIN I
TROPONIN I: 0.09 ng/mL — AB (ref <0.03)
Troponin I: 0.05 ng/mL (ref <0.03)

## 2017-01-14 LAB — APTT: APTT: 30 s (ref 24–36)

## 2017-01-14 SURGERY — CORONARY STENT INTERVENTION
Anesthesia: LOCAL

## 2017-01-14 SURGERY — ECHOCARDIOGRAM, TRANSESOPHAGEAL
Anesthesia: Monitor Anesthesia Care

## 2017-01-14 MED ORDER — FUROSEMIDE 10 MG/ML IJ SOLN
40.0000 mg | Freq: Three times a day (TID) | INTRAMUSCULAR | Status: DC
  Administered 2017-01-14 – 2017-01-15 (×3): 40 mg via INTRAVENOUS
  Filled 2017-01-14 (×2): qty 4

## 2017-01-14 MED ORDER — FUROSEMIDE 10 MG/ML IJ SOLN
40.0000 mg | Freq: Two times a day (BID) | INTRAMUSCULAR | Status: DC
  Filled 2017-01-14: qty 4

## 2017-01-14 MED ORDER — ZOLPIDEM TARTRATE 5 MG PO TABS: 5.0000 mg | ORAL_TABLET | Freq: Once | ORAL | Status: DC

## 2017-01-14 MED ORDER — LOSARTAN POTASSIUM 25 MG PO TABS
12.5000 mg | ORAL_TABLET | Freq: Every day | ORAL | Status: DC
  Administered 2017-01-14 – 2017-01-15 (×2): 12.5 mg via ORAL
  Filled 2017-01-14 (×2): qty 1

## 2017-01-14 MED ORDER — DEXTROSE-NACL 5-0.9 % IV SOLN
INTRAVENOUS | Status: DC
  Administered 2017-01-14: 02:00:00 via INTRAVENOUS

## 2017-01-14 MED ORDER — CARVEDILOL 3.125 MG PO TABS: 3.1250 mg | ORAL_TABLET | Freq: Two times a day (BID) | ORAL | Status: DC

## 2017-01-14 MED ORDER — LOSARTAN POTASSIUM 25 MG PO TABS: 12.5000 mg | ORAL_TABLET | Freq: Every day | ORAL | Status: DC

## 2017-01-14 MED ORDER — CARVEDILOL 6.25 MG PO TABS
6.2500 mg | ORAL_TABLET | Freq: Two times a day (BID) | ORAL | Status: DC
  Administered 2017-01-14 – 2017-01-15 (×3): 6.25 mg via ORAL
  Filled 2017-01-14 (×4): qty 1

## 2017-01-14 NOTE — Progress Notes (Signed)
Nurse called that pt had increase of resp with sp02 100% on 2 L, resp 28-30.  BP stable his NTG had been reduced to 3 mg from 8.  I asked her to go back up on NTG, get CXR stat, duoneb, and labs.  No chest pain.  Dr. Hubbard Hartshorn made aware. He did begin to improve after NTG increased.

## 2017-01-14 NOTE — Progress Notes (Signed)
Nutrition Follow-up  DOCUMENTATION CODES:   Severe malnutrition in context of chronic illness  INTERVENTION:   When able to use PEG, resume:  Jevity 1.2 at 65 ml/h (1560 ml per day)  Provides 1872 kcal, 87 gm protein, 1264 ml free water daily (100% of estimated needs)  NUTRITION DIAGNOSIS:   Malnutrition (severe) related to chronic illness (CHF; cancer) as evidenced by severe depletion of muscle mass, percent weight loss (8% weight loss within 1 month).  Ongoing  GOAL:   Patient will meet greater than or equal to 90% of their needs   Met with TF  MONITOR:   TF tolerance, I & O's, Labs  ASSESSMENT:   66 year old male with PMH of GERD, squamous cell carcinoma of the left puriform and squamous cell carcinoma of the esophagus s/p chemotherapy and radiation (2015) currently in remission, systolic CHF, and stage 3 CKD (Bas Crt 1.2-1.3). Presents to ED on 5/5 with progressive dyspnea for the last several weeks.   SLP is following for dysphagia treatment. Remains NPO at this time. PEG was placed by IR on 5/16. Patient is not receiving TF at this time. Per RN, he needs to be NPO for upcoming port-a-cath removal.  Labs reviewed: sodium 133 (L) Medications reviewed and include MVI.  Diet Order:  Diet NPO time specified Except for: Sips with Meds  Skin:  Reviewed, no issues  Last BM:  5/14  Height:   Ht Readings from Last 1 Encounters:  01/02/17 '5\' 6"'  (1.676 m)    Weight:   Wt Readings from Last 1 Encounters:  01/14/17 121 lb 12.8 oz (55.2 kg)    Ideal Body Weight:  64.5 kg  BMI:  Body mass index is 19.66 kg/m.  Estimated Nutritional Needs:   Kcal:  1700-1900  Protein:  80-90 gm  Fluid:  1.7 L  EDUCATION NEEDS:   No education needs identified at this time  Molli Barrows, [place], [place], [place] Pager [phone] After Hours Pager [phone]

## 2017-01-14 NOTE — Progress Notes (Signed)
Progress Note  Patient Name: Collin Young Date of Encounter: 01/14/2017  Primary Cardiologist: new -Rr. [place]   Subjective   He complains of chest awareness with breathing, no chest pain - he feels much better  Inpatient Medications    Scheduled Meds: . aspirin EC  81 mg Oral Daily  . atorvastatin  20 mg Oral Daily  . budesonide (PULMICORT) nebulizer solution  0.5 mg Nebulization BID  . carvedilol  6.25 mg Oral BID WC  . multivitamin with minerals  1 tablet Oral Daily  . sodium chloride flush  3 mL Intravenous Q12H   Continuous Infusions: . sodium chloride 250 mL (01/14/17 0649)  .  ceFAZolin (ANCEF) IV Stopped (01/14/17 1610)  . dextrose 5 % and 0.9% NaCl 50 mL/hr at 01/14/17 0309  . feeding supplement (JEVITY 1.2 CAL) 1,000 mL (01/12/17 1454)  . fluconazole (DIFLUCAN) IV Stopped (01/13/17 1908)  . nitroGLYCERIN 8 mcg/min (01/14/17 0309)   PRN Meds: sodium chloride, acetaminophen, guaiFENesin-dextromethorphan, HYDROcodone-acetaminophen, HYDROmorphone (DILAUDID) injection, ipratropium-albuterol, lip balm, MUSCLE RUB, ondansetron (ZOFRAN) IV, phenol, polyethylene glycol, polyvinyl alcohol, sodium chloride, sodium chloride flush, sodium chloride flush, white petrolatum   Vital Signs    Vitals:   01/13/17 2045 01/14/17 0020 01/14/17 0301 01/14/17 0431  BP:  138/86  103/89  Pulse: (!) 108 (!) 102 100   Resp: (!) 25     Temp:  98 F (36.7 C)    TempSrc:  Oral    SpO2: 99% 100% 100%   Weight:   121 lb 12.8 oz (55.2 kg)   Height:        Intake/Output Summary (Last 24 hours) at 01/14/17 0814 Last data filed at 01/14/17 0719  Gross per 24 hour  Intake           223.78 ml  Output              600 ml  Net          -376.22 ml   Filed Weights   01/12/17 0951 01/13/17 0438 01/14/17 0301  Weight: 124 lb 12.8 oz (56.6 kg) 124 lb 6.4 oz (56.4 kg) 121 lb 12.8 oz (55.2 kg)    Telemetry    SR to ST at 98-105 - Personally Reviewed  ECG    no new from 01/02/17 -  Personally Reviewed  Physical Exam   GEN: No acute distress.   Neck: mild JVD Cardiac: RRR, harsh systolic murmur, rubs, S3 gallops.  Respiratory: Clear to auscultation bilaterally with occ rhonchi no wheezes. GI: Soft, nontender, non-distended + gastrostomy tube MS: No edema; No deformity. Neuro:  Nonfocal  Psych: Normal affect   Labs    Chemistry Recent Labs Lab 01/11/17 0523 01/12/17 0446 01/13/17 0456  NA 133* 130* 130*  K 4.0 3.9 4.7  CL 96* 95* 95*  CO2 29 28 26   GLUCOSE 127* 153* 113*  BUN 24* 20 20  CREATININE 0.91 0.83 0.85  CALCIUM 7.4* 7.8* 8.2*  GFRNONAA >60 >60 >60  GFRAA >60 >60 >60  ANIONGAP 8 7 9      Hematology Recent Labs Lab 01/12/17 0446 01/13/17 0456 01/14/17 0608  WBC 13.6* 14.6* 13.7*  RBC 4.13* 4.59 4.47  HGB 11.7* 12.6* 12.5*  HCT 35.7* 38.8* 38.1*  MCV 86.4 84.5 85.2  MCH 28.3 27.5 28.0  MCHC 32.8 32.5 32.8  RDW 14.2 14.1 14.4  PLT 193 234 283    Cardiac EnzymesNo results for input(s): TROPONINI in the last 168 hours. No results for input(s):  TROPIPOC in the last 168 hours.   BNPNo results for input(s): BNP, PROBNP in the last 168 hours.   DDimer No results for input(s): DDIMER in the last 168 hours.   Radiology    Ir Gastrostomy Tube Mod Sed  Result Date: 01/13/2017 CLINICAL DATA:  Esophagitis. History of esophageal carcinoma. Dysphagia, malnutrition, needs enteral feeding support. EXAM: PERC PLACEMENT GASTROSTOMY FLUOROSCOPY TIME:  70 uGym2 DAP, 2 minutes 30 seconds TECHNIQUE: The procedure, risks, benefits, and alternatives were explained to the patient. Questions regarding the procedure were encouraged and answered. The patient understands and consents to the procedure. Patient was receiving adequate prophylactic antibiotic coverage.Progression of previously administered oral barium into the colon was confirmed fluoroscopically. A 5 French angiographic catheter was placed as orogastric tube. The upper abdomen was prepped with  Betadine, draped in usual sterile fashion, and infiltrated locally with 1% lidocaine. Intravenous Fentanyl and Versed were administered as conscious sedation during continuous monitoring of the patient's level of consciousness and physiological / cardiorespiratory status by the radiology RN, with a total moderate sedation time of 11 minutes. Stomach was insufflated using air through the orogastric tube. 0.5 mg glucagon given IV to facilitate gastric distention. An 70 French sheath needle was advanced percutaneously into the gastric lumen under fluoroscopy. Gas could be aspirated and a small contrast injection confirmed intraluminal spread. The sheath was exchanged over a guidewire for a 9 [place] vascular sheath, through which the snare device was advanced and used to snare a guidewire passed through the orogastric tube. This was withdrawn, and the snare attached to the 20 French pull-through gastrostomy tube, which was advanced antegrade, positioned with the internal bumper securing the anterior gastric wall to the anterior abdominal wall. Small contrast injection confirms appropriate positioning. The external bumper was applied and the catheter was flushed. COMPLICATIONS: COMPLICATIONS none IMPRESSION: 1. Technically successful 20 French pull-through gastrostomy placement under fluoroscopy. Electronically Signed   By: Lucrezia Europe M.D.   On: 01/13/2017 10:50    Cardiac Studies   Echo 01/03/17: Study Conclusions  - Left ventricle: The cavity size was normal. Wall thickness was normal. The estimated ejection fraction was in the range of 10% to 15%. Diffuse hypokinesis. Diastolic function is abnormal, indeterminant grade. - Aortic valve: There was moderate regurgitation. Valve area (VTI): 1.08 cm^2. Valve area (Vmax): 1.5 cm^2. Valve area (Vmean): 1.5 cm^2. - Mitral valve: There was moderate regurgitation. - Left atrium: The atrium was mildly dilated. - Right ventricle: Systolic function was  mildly to moderately reduced. - Tricuspid valve: There was moderate regurgitation. - Pulmonary arteries: Systolic pressure was moderately increased. PA peak pressure: 49 mm Hg (S).  LHC/RHC 01/05/17: Prox RCA to Mid RCA lesion, 99 %stenosed.  Mid RCA lesion, 30 %stenosed.  Dist RCA lesion, 40 %stenosed.  RPDA lesion, 20 %stenosed.  Prox Cx lesion, 100 %stenosed.  Ost Ramus to Ramus lesion, 50 %stenosed.  Ost 3rd Diag to 3rd Diag lesion, 30 %stenosed.  Mid LAD lesion, 40 %stenosed.  Ost LAD to Prox LAD lesion, 20 %stenosed.  Ost LM to LM lesion, 40 %stenosed.  Hemodynamic findings consistent with mild pulmonary hypertension.  1. Severe calcific disease in the proximal to mid RCA 2. 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 Moderate left main and mid LAD stenosis 4. Elevated filling pressures  Patient Profile     66 y.o. 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  Found to have severe 3 vessel CAD on cath.  Hospitalization complicated by aspiration pneumonia and MSSA bacteremia.  Pt's case discussed with interventional team.   "His RCA intervention would be technically challenging and would likely require hemodynamic support given his low LVEF.  Given his frailty and lack of ischemia symptoms, we will pursue medical management unless he develops chest pain.  We will try to wean milrinone tonight.  Hopefully his BP will tolerate titration of medical therapy, starting tomorrow.  Plan for close outpatient follow up at discharge and we will consider PCI once more stable and his bacteremia has cleared."  Assessment & Plan    Acute systolic and diastolic HF- now off milrinone, NTG is at 8 mcg.   On coreg 6.25 BID  Hopefully transition to an ACE-I/ARB. Now + 3233.  Wt though is down from  yesterday to 121 from 124 lbs. Co-ox 58 --labile BP 143/91 to 103/89  ICM with EF 10-15% see above  Multivessel CAD - he is too frail for CABG--see above but treat medically and once bacteremia has cleared consider PCI.    MSSA bacteremia unable to do TEE,  Given his prior surgery (esophageal cancer) and XRT with aspiration PNA and failed swallow study.  Manage empirically    Pulmonary HTN see above.  Signed, Cecilie Kicks, NP  01/14/2017, 8:14 AM

## 2017-01-14 NOTE — Progress Notes (Signed)
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 He presented to the emergency department complaining of progressive shortness of breath for the past several weeks. He was admitted with concerns of CHF, with BNP significantly elevated at 2000. Echocardiogram revealed ejection fraction of 10-15% patient was started on beta blockers Lasix and Aldactone. Patient also on admission was found to be on acute kidney injury. Cardiology consulted, underwent cardiac cath showing significant multivessel disease. Recommended staged PCI when creatinine improves. Dysphagia was evaluated with recommendation per SLP for total NPO status, NG tube and reevaluation once clinical status improved. Blood cultures have been positive for MSSA and ID is making recommendations. Palliative care has been consulted and after family meeting, patient is DNR. PEG tube placed 5/16. Repeat blood cultures from 5/12 following initiation of effective abx grew MSSA again in ~48 hrs. Repeat cultures were drawn 5/14. PCI is planned 5/17. He remains afebrile, though WBC has been rising mildly.  Subjective: No dyspnea or chest pain. Has mild left shoulder pain with breathing. Abd sore.   Assessment & Plan: Acute respiratory failure with hypoxia: Due to ischemic cardiomyopathy/acute systolic CHF, pulmonary edema, and likely aspiration pneumonitis/pneumonia. CXR show worsening air space disease, pneumonia vs pulm edema, he is afebrile and no leukocytosis.  - Resolved - Continue nebs per pulmonology - Continue antibiotics as below - Sputum culture was cancelled for unclear reasons.  Acute systolic heart failure: New diagnosis, due to  multivessel CAD.  - NTG gtt for pulmonary edema. Holding lasix. ACE and aldactone on hold due to AKI - Daily weight, I&O's low salt diet. - Milrinone per cardiology, hopeful to wean. Doubt he would do well at home with this.   Ischemic cardiomyopathy: Left main and LAD with moderate disease and chronically occluded LCx in addition to 99% stenosis of RCA thought to be amenable to atherectomy/staged PCI pending renal function.  - ASA, statin, plavix. - LHC w/RCA intervention would be technically challenging and high risk, given his lack of angina, plan per cardiology is to consider PCI once more stable at follow up.  Severe aspiration: Related to h/o radiation to esophagus, MBSS 5/11 demonstrated frank aspiration of all consistencies. Cortrak was placed as bridge to PEG. - PEG placed 5/16.  - Nutrition consulted.   MSSA bacteremia: in 1 of 2 blood cultures from 5/10 and again on 5/12. PCT improving 10 > 2. 5/14 Blood cultures NGTD - ID following, on ancef. Will pull port today, insert PICC, which will start day 1 of 28 of IV abx.   AKI: Suspect cardiorenal syndrome vs from contrast vs diuretics. Poor CO leading to poor kidney perfusion. FENa 0.64% suggestive of prerenal etiology. - Monitor daily, resolved  Complex left renal cyst: Warrants additional surveillance with MRI or CT with contrast will defer after patient stable given patient will receive multiple contrast doses for cardiac cath. This can be follow as outpatient as well.   Enlarged prostate: Seen on U/S. No LUTS/BOO. PSA normal.  - No medications given lack of symptoms.   Hypertension: Per patient was taken off his BP meds by PCP PTA  - Continue coreg and NTG gtt as tolerated   Squamous cell CA of the left piriform sinus and esophagus: In  remission  - Continuing home fentanyl patch.   Hyponatremia: Suspect hypervolemia, improving, mild. - Monitor  DVT prophylaxis: Lovenox  Code Status: Full Family Communication: Daughter  at bedside this AM Disposition Plan: Continue SDU level of care given milrinone, NTG drip titration. Transition to telemetry once off infusions.  Consultants:   Cardiology   PCCM  ID   Palliative care  IR  Procedures:   ECHO 01/03/17 - Left ventricle: The cavity size was normal. Wall thickness was   normal. The estimated ejection fraction was in the range of 10%   to 15%. Diffuse hypokinesis. DIastolic function is abnormal,   indeterminant grade. - Aortic valve: There was moderate regurgitation. Valve area (VTI):   1.08 cm^2. Valve area (Vmax): 1.5 cm^2. Valve area (Vmean): 1.5   cm^2. - Mitral valve: There was moderate regurgitation. - Left atrium: The atrium was mildly dilated. - Right ventricle: Systolic function was mildly to moderately   reduced. - Tricuspid valve: There was moderate regurgitation. - Pulmonary arteries: Systolic pressure was moderately increased.   PA peak pressure: 49 mm Hg (S).  Right/Left Heart Cath and Coronary Angiography 01/05/2017     Prox RCA to Mid RCA lesion, 99 %stenosed.  Mid RCA lesion, 30 %stenosed.  Dist RCA lesion, 40 %stenosed.  RPDA lesion, 20 %stenosed.  Prox Cx lesion, 100 %stenosed.  Ost Ramus to Ramus lesion, 50 %stenosed.  Ost 3rd Diag to 3rd Diag lesion, 30 %stenosed.  Mid LAD lesion, 40 %stenosed.  Ost LAD to Prox LAD lesion, 20 %stenosed.  Ost LM to LM lesion, 40 %stenosed.  Hemodynamic findings consistent with mild pulmonary hypertension.  1. Severe calcific disease in the proximal to mid RCA 2. 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 Moderate left main and mid LAD stenosis 4. Elevated filling pressures  Recommendations: He has multivessel CAD. I think the RCA can be approached with PCI but will likely require atherectomy prior to stenting. I would treat the remainder of his CAD medically. Will stage PCI given renal insufficiency. Hydrate gently tonight. May need diuresis tomorrow given  elevated filling pressures. Would anticipate PCI on Thursday or Friday of this week if renal function remains stable.    PEG Placement 01/13/2017  Antimicrobials: Vancomycin 5/10 > 5/12 Cefepime 5/12 > 5/14  Ancef 5/14 >>   Objective: Vitals:   01/14/17 0020 01/14/17 0301 01/14/17 0431 01/14/17 0834  BP: 138/86  103/89   Pulse: (!) 102 100    Resp:      Temp: 98 F (36.7 C)     TempSrc: Oral     SpO2: 100% 100%  100%  Weight:  55.2 kg (121 lb 12.8 oz)    Height:        Intake/Output Summary (Last 24 hours) at 01/14/17 0849 Last data filed at 01/14/17 0719  Gross per 24 hour  Intake           223.78 ml  Output              600 ml  Net          -376.22 ml   Filed Weights   01/12/17 0951 01/13/17 0438 01/14/17 0301  Weight: 56.6 kg (124 lb 12.8 oz) 56.4 kg (124 lb 6.4 oz) 55.2 kg (121 lb 12.8 oz)    Examination: General exam: Thin male in no distress appearing older than stated age Respiratory system: No accessory muscle use on room air. No wheezes or crackles. Cardiovascular system: Regular, +S4 noted without murmur.  No LE edema Gastrointestinal system: Abd Soft NTND, +BS. PEG in situ LUQ nontender, no discharge. Extremities: No LE edema, no deformity Skin: No lesions or rash. Right upper chest port site c/d/i without erythema.   Data Reviewed: I have personally reviewed following labs and imaging studies  CBC:  Recent Labs Lab 01/10/17 0500 01/11/17 0523 01/12/17 0446 01/13/17 0456 01/14/17 0608  WBC 10.7* 12.6* 13.6* 14.6* 13.7*  HGB 11.5* 10.9* 11.7* 12.6* 12.5*  HCT 35.2* 33.3* 35.7* 38.8* 38.1*  MCV 86.1 86.5 86.4 84.5 85.2  PLT 174 172 193 234 665   Basic Metabolic Panel:  Recent Labs Lab 01/07/17 2035  01/08/17 0410 01/09/17 0451 01/10/17 0500 01/11/17 0523 01/12/17 0446 01/13/17 0456  NA  --   < > 125* 128* 130* 133* 130* 130*  K  --   < > 4.4 3.9 3.8 4.0 3.9 4.7  CL  --   < > 86* 88* 92* 96* 95* 95*  CO2  --   < > 27 32 33* 29 28 26     GLUCOSE  --   < > 91 122* 154* 127* 153* 113*  BUN  --   < > 66* 47* 30* 24* 20 20  CREATININE  --   < > 2.02* 1.33* 1.15 0.91 0.83 0.85  CALCIUM  --   < > 8.3* 8.0* 7.9* 7.4* 7.8* 8.2*  MG 2.5*  --  2.7*  --   --   --   --   --   PHOS 2.8  --  3.5  --   --   --   --   --   < > = values in this interval not displayed. GFR: Estimated Creatinine Clearance: 66.7 mL/min (by C-G formula based on SCr of 0.85 mg/dL). Liver Function Tests: No results for input(s): AST, ALT, ALKPHOS, BILITOT, PROT, ALBUMIN in the last 168 hours. Recent Results (from the past 240 hour(s))  Culture, blood (Routine X 2) w Reflex to ID Panel     Status: None   Collection Time: 01/07/17  3:43 PM  Result Value Ref Range Status   Specimen Description BLOOD RIGHT ANTECUBITAL  Final   Special Requests IN PEDIATRIC BOTTLE Blood Culture adequate volume  Final   Culture NO GROWTH 5 DAYS  Final   Report Status 01/12/2017 FINAL  Final  Culture, blood (Routine X 2) w Reflex to ID Panel     Status: Abnormal   Collection Time: 01/07/17  3:47 PM  Result Value Ref Range Status   Specimen Description BLOOD RIGHT HAND  Final   Special Requests IN PEDIATRIC BOTTLE Blood Culture adequate volume  Final   Culture  Setup Time   Final    GRAM POSITIVE COCCI IN CLUSTERS IN PEDIATRIC BOTTLE CRITICAL RESULT CALLED TO, READ BACK BY AND VERIFIED WITH: Leonie Green Pharm.D. 13:30 01/08/17 ([email or web address])    Culture STAPHYLOCOCCUS AUREUS (A)  Final   Report Status 01/10/2017 FINAL  Final   Organism ID, Bacteria STAPHYLOCOCCUS AUREUS  Final      Susceptibility   Staphylococcus aureus - MIC*    CIPROFLOXACIN <=0.5 SENSITIVE Sensitive     ERYTHROMYCIN <=0.25 SENSITIVE Sensitive     GENTAMICIN <=0.5 SENSITIVE Sensitive     OXACILLIN 0.5 SENSITIVE Sensitive     TETRACYCLINE <=1 SENSITIVE Sensitive     VANCOMYCIN <=0.5 SENSITIVE Sensitive     TRIMETH/SULFA <=10 SENSITIVE Sensitive     CLINDAMYCIN <=0.25 SENSITIVE Sensitive     RIFAMPIN <=0.5  SENSITIVE Sensitive  Inducible Clindamycin NEGATIVE Sensitive     * STAPHYLOCOCCUS AUREUS  Blood Culture ID Panel (Reflexed)     Status: Abnormal   Collection Time: 01/07/17  3:47 PM  Result Value Ref Range Status   Enterococcus species NOT DETECTED NOT DETECTED Final   Listeria monocytogenes NOT DETECTED NOT DETECTED Final   Staphylococcus species DETECTED (A) NOT DETECTED Final    Comment: CRITICAL RESULT CALLED TO, READ BACK BY AND VERIFIED WITH: M. Vonita Moss.D. 13:30 01/08/17 ([email or web address])    Staphylococcus aureus DETECTED (A) NOT DETECTED Final    Comment: Methicillin (oxacillin) susceptible Staphylococcus aureus (MSSA). Preferred therapy is anti staphylococcal beta lactam antibiotic (Cefazolin or Nafcillin), unless clinically contraindicated. CRITICAL RESULT CALLED TO, READ BACK BY AND VERIFIED WITH: M. Radford Pax Pharm.D. 13:30 01/08/17 ([email or web address])    Methicillin resistance NOT DETECTED NOT DETECTED Final   Streptococcus species NOT DETECTED NOT DETECTED Final   Streptococcus agalactiae NOT DETECTED NOT DETECTED Final   Streptococcus pneumoniae NOT DETECTED NOT DETECTED Final   Streptococcus pyogenes NOT DETECTED NOT DETECTED Final   Acinetobacter baumannii NOT DETECTED NOT DETECTED Final   Enterobacteriaceae species NOT DETECTED NOT DETECTED Final   Enterobacter cloacae complex NOT DETECTED NOT DETECTED Final   Escherichia coli NOT DETECTED NOT DETECTED Final   Klebsiella oxytoca NOT DETECTED NOT DETECTED Final   Klebsiella pneumoniae NOT DETECTED NOT DETECTED Final   Proteus species NOT DETECTED NOT DETECTED Final   Serratia marcescens NOT DETECTED NOT DETECTED Final   Haemophilus influenzae NOT DETECTED NOT DETECTED Final   Neisseria meningitidis NOT DETECTED NOT DETECTED Final   Pseudomonas aeruginosa NOT DETECTED NOT DETECTED Final   Candida albicans NOT DETECTED NOT DETECTED Final   Candida glabrata NOT DETECTED NOT DETECTED Final   Candida krusei NOT DETECTED NOT DETECTED  Final   Candida parapsilosis NOT DETECTED NOT DETECTED Final   Candida tropicalis NOT DETECTED NOT DETECTED Final  MRSA PCR Screening     Status: None   Collection Time: 01/07/17  5:21 PM  Result Value Ref Range Status   MRSA by PCR NEGATIVE NEGATIVE Final    Comment:        The GeneXpert MRSA Assay (FDA approved for NASAL specimens only), is one component of a comprehensive MRSA colonization surveillance program. It is not intended to diagnose MRSA infection nor to guide or monitor treatment for MRSA infections.   Culture, blood (Routine X 2) w Reflex to ID Panel     Status: Abnormal   Collection Time: 01/09/17 10:15 AM  Result Value Ref Range Status   Specimen Description BLOOD RIGHT HAND  Final   Special Requests   Final    Blood Culture results may not be optimal due to an inadequate volume of blood received in culture bottles   Culture  Setup Time   Final    GRAM POSITIVE COCCI IN CLUSTERS Organism ID to follow IN PEDIATRIC BOTTLE CRITICAL RESULT CALLED TO, READ BACK BY AND VERIFIED WITH: N. JOHNSTON PHARMD, AT 0848 01/10/17 BY D. VANHOOK    Culture STAPHYLOCOCCUS AUREUS (A)  Final   Report Status 01/12/2017 FINAL  Final   Organism ID, Bacteria STAPHYLOCOCCUS AUREUS  Final      Susceptibility   Staphylococcus aureus - MIC*    CIPROFLOXACIN <=0.5 SENSITIVE Sensitive     ERYTHROMYCIN <=0.25 SENSITIVE Sensitive     GENTAMICIN <=0.5 SENSITIVE Sensitive     OXACILLIN <=0.25 SENSITIVE Sensitive     TETRACYCLINE <=1 SENSITIVE Sensitive     VANCOMYCIN <=0.5  SENSITIVE Sensitive     TRIMETH/SULFA <=10 SENSITIVE Sensitive     CLINDAMYCIN <=0.25 SENSITIVE Sensitive     RIFAMPIN <=0.5 SENSITIVE Sensitive     Inducible Clindamycin NEGATIVE Sensitive     * STAPHYLOCOCCUS AUREUS  Blood Culture ID Panel (Reflexed)     Status: Abnormal   Collection Time: 01/09/17 10:15 AM  Result Value Ref Range Status   Enterococcus species NOT DETECTED NOT DETECTED Final   Listeria monocytogenes  NOT DETECTED NOT DETECTED Final   Staphylococcus species DETECTED (A) NOT DETECTED Final    Comment: CRITICAL RESULT CALLED TO, READ BACK BY AND VERIFIED WITH: V. JOHNSTON PHARMD, AT 0848 01/10/17 BY D. VANHOOK    Staphylococcus aureus DETECTED (A) NOT DETECTED Final    Comment: Methicillin (oxacillin) susceptible Staphylococcus aureus (MSSA). Preferred therapy is anti staphylococcal beta lactam antibiotic (Cefazolin or Nafcillin), unless clinically contraindicated. CRITICAL RESULT CALLED TO, READ BACK BY AND VERIFIED WITH: V. JOHNSTON PHARMD, AT 0848 01/10/17 BY D. VANHOOK    Methicillin resistance NOT DETECTED NOT DETECTED Final   Streptococcus species NOT DETECTED NOT DETECTED Final   Streptococcus agalactiae NOT DETECTED NOT DETECTED Final   Streptococcus pneumoniae NOT DETECTED NOT DETECTED Final   Streptococcus pyogenes NOT DETECTED NOT DETECTED Final   Acinetobacter baumannii NOT DETECTED NOT DETECTED Final   Enterobacteriaceae species NOT DETECTED NOT DETECTED Final   Enterobacter cloacae complex NOT DETECTED NOT DETECTED Final   Escherichia coli NOT DETECTED NOT DETECTED Final   Klebsiella oxytoca NOT DETECTED NOT DETECTED Final   Klebsiella pneumoniae NOT DETECTED NOT DETECTED Final   Proteus species NOT DETECTED NOT DETECTED Final   Serratia marcescens NOT DETECTED NOT DETECTED Final   Haemophilus influenzae NOT DETECTED NOT DETECTED Final   Neisseria meningitidis NOT DETECTED NOT DETECTED Final   Pseudomonas aeruginosa NOT DETECTED NOT DETECTED Final   Candida albicans NOT DETECTED NOT DETECTED Final   Candida glabrata NOT DETECTED NOT DETECTED Final   Candida krusei NOT DETECTED NOT DETECTED Final   Candida parapsilosis NOT DETECTED NOT DETECTED Final   Candida tropicalis NOT DETECTED NOT DETECTED Final  Culture, blood (routine x 2)     Status: None (Preliminary result)   Collection Time: 01/11/17  2:58 PM  Result Value Ref Range Status   Specimen Description BLOOD RIGHT  ANTECUBITAL  Final   Special Requests   Final    BOTTLES DRAWN AEROBIC ONLY Blood Culture adequate volume   Culture NO GROWTH 2 DAYS  Final   Report Status PENDING  Incomplete  Culture, blood (routine x 2)     Status: None (Preliminary result)   Collection Time: 01/11/17  3:02 PM  Result Value Ref Range Status   Specimen Description BLOOD RIGHT HAND  Final   Special Requests   Final    BOTTLES DRAWN AEROBIC ONLY Blood Culture adequate volume   Culture NO GROWTH 2 DAYS  Final   Report Status PENDING  Incomplete      Radiology Studies: Ir Gastrostomy Tube Mod Sed  Result Date: 01/13/2017 CLINICAL DATA:  Esophagitis. History of esophageal carcinoma. Dysphagia, malnutrition, needs enteral feeding support. EXAM: PERC PLACEMENT GASTROSTOMY FLUOROSCOPY TIME:  38 uGym2 DAP, 2 minutes 30 seconds TECHNIQUE: The procedure, risks, benefits, and alternatives were explained to the patient. Questions regarding the procedure were encouraged and answered. The patient understands and consents to the procedure. Patient was receiving adequate prophylactic antibiotic coverage.Progression of previously administered oral barium into the colon was confirmed fluoroscopically. A 5 French angiographic catheter was  placed as orogastric tube. The upper abdomen was prepped with Betadine, draped in usual sterile fashion, and infiltrated locally with 1% lidocaine. Intravenous Fentanyl and Versed were administered as conscious sedation during continuous monitoring of the patient's level of consciousness and physiological / cardiorespiratory status by the radiology RN, with a total moderate sedation time of 11 minutes. Stomach was insufflated using air through the orogastric tube. 0.5 mg glucagon given IV to facilitate gastric distention. An 36 French sheath needle was advanced percutaneously into the gastric lumen under fluoroscopy. Gas could be aspirated and a small contrast injection confirmed intraluminal spread. The sheath  was exchanged over a guidewire for a 9 [place] vascular sheath, through which the snare device was advanced and used to snare a guidewire passed through the orogastric tube. This was withdrawn, and the snare attached to the 20 French pull-through gastrostomy tube, which was advanced antegrade, positioned with the internal bumper securing the anterior gastric wall to the anterior abdominal wall. Small contrast injection confirms appropriate positioning. The external bumper was applied and the catheter was flushed. COMPLICATIONS: COMPLICATIONS none IMPRESSION: 1. Technically successful 20 French pull-through gastrostomy placement under fluoroscopy. Electronically Signed   By: Lucrezia Europe M.D.   On: 01/13/2017 10:50   Scheduled Meds: . aspirin EC  81 mg Oral Daily  . atorvastatin  20 mg Oral Daily  . budesonide (PULMICORT) nebulizer solution  0.5 mg Nebulization BID  . carvedilol  6.25 mg Oral BID WC  . multivitamin with minerals  1 tablet Oral Daily  . sodium chloride flush  3 mL Intravenous Q12H   Continuous Infusions: . sodium chloride 250 mL (01/14/17 0649)  .  ceFAZolin (ANCEF) IV Stopped (01/14/17 8916)  . dextrose 5 % and 0.9% NaCl 50 mL/hr at 01/14/17 0309  . feeding supplement (JEVITY 1.2 CAL) 1,000 mL (01/12/17 1454)  . fluconazole (DIFLUCAN) IV Stopped (01/13/17 1908)  . nitroGLYCERIN 8 mcg/min (01/14/17 0309)     LOS: 12 days   Vance Gather, MD Pager: Text Page via [email or web address]  [phone]   If 7PM-7AM, please contact night-coverage [email or web address] Password [facility] 01/14/2017, 8:49 AM

## 2017-01-14 NOTE — Progress Notes (Signed)
SLP Cancellation Note  Patient Details Name: Collin Young MRN: [id number] DOB: 1950-12-24   Cancelled treatment:        Patient reports chest pain. Nurse notified. Unable to see patient due to medical condition.   Charlynne Cousins Ahmia Colford 01/14/2017, 2:14 PM

## 2017-01-14 NOTE — Care Management Important Message (Signed)
Important Message  Patient Details  Name: Collin Young MRN: [id number] Date of Birth: Nov 01, 1950   Medicare Important Message Given:  Yes    [name] 01/14/2017, 11:23 AM

## 2017-01-14 NOTE — Progress Notes (Addendum)
Around 1300 went to check on pt and upon walking into the room pt was visibly short of breath, diaphoretic, and tachy. Pt BP was in 493'X systolically and resp 52-17. Placed pt on 4L of O2 and paged Cecilie Kicks NP. Received orders for an EKG, troponin, increase NTG, duoneb, and CXR. Dr. Oval Linsey also ordered 40mg  of IV lasix. Pt received IV lasix, labs taken, and CXR done. Pt unable to go down for port-a-cath removal. Will continue to monitor pt.   Albertina Senegal E   Pt began to improve after receiving lasix and put out around 1000 mL. Pt currently on 13mcg of IV nitro and is feeling better with no shortness of breath. Will continue to monitor.

## 2017-01-14 NOTE — Progress Notes (Addendum)
Progress Note:  Mr. Wlodarczyk had an episode of hypoxic respiratory distress and tachycardia.  He reports that he has been experiencing orthopnea for the last several nights.  Of note, milrinone was discontinued last night and we attempted to wean his nitroglycerin drip today.  It seems that he developed flash pulmonary edema as nitroglycerin was weaned.  We will start losartan for increased afterload reduction and blood pressure control.  If he remains stable we will try to switch from IV to long-acting oral nitrates.  Restart diuresis with lasix 40 mg IV q8h.  If these measures fail, the only options would be resuming milrinone with the plan for home inotropes or comfort care.  It is not clear that home inotropes would be an option.   Time spent: 30 minutes-Greater than 50% of this time was spent in counseling, explanation of diagnosis, planning of further management, and coordination of care.  Jaki Steptoe C. Oval Linsey, MD, [facility]  01/14/2017 1:42 PM

## 2017-01-14 NOTE — Consult Note (Signed)
Chief Complaint: Patient was seen in consultation today for removal of Port a Cath  Chief Complaint  Patient presents with  . Shortness of Breath   at the request of Dr Mathews Robinsons  Referring Physician(s): Dr Carlyle Basques  Supervising Physician: Aletta Edouard  Patient Status: [facility] - In-pt  History of Present Illness: Collin Young is a 66 y.o. male   Esophageal cancer New Gastric tube placed yesterday in IR Existing PAC placed 08/2013 per records + MSSA and aspiration PNA ID asking for [facility] removal   Past Medical History:  Diagnosis Date  . Cancer [facility])    Esophagus- radiation 2011  . GERD (gastroesophageal reflux disease)    takes zantac prn  . Headache   . History of radiation therapy 10/19/2013-12/05/2013   pyriform sinus 69.96 Gy/75fx  . HOH (hard of hearing)   . Hyperlipemia   . Hypertension   . Seizures ([place])    alcohol withdrawls- 2001    Past Surgical History:  Procedure Laterality Date  . CARDIOVASCULAR STRESS TEST  10/09/09   normal nuclearr stress test, EF 57% [name])  . IR GASTROSTOMY TUBE MOD SED  01/13/2017  . LARYNGOSCOPY N/A 09/15/2013   Procedure: LARYNGOSCOPY;  Surgeon: [name], MD;  Location: [place];  Service: ENT;  Laterality: N/A;  direct laryngoscopy with biopsy and esophagoscopy  . NO PAST SURGERIES    . RIGHT/LEFT HEART CATH AND CORONARY ANGIOGRAPHY N/A 01/05/2017   Procedure: Right/Left Heart Cath and Coronary Angiography;  Surgeon: [name], MD;  Location: [place] CV LAB;  Service: Cardiovascular;  Laterality: N/A;    Allergies: Patient has no known allergies.  Medications: Prior to Admission medications   Medication Sig Start Date End Date Taking? Authorizing Provider  atorvastatin (LIPITOR) 20 MG tablet Take 20 mg by mouth daily. 12/30/16  Yes [provider]  fentaNYL (DURAGESIC - DOSED MCG/HR) 12 MCG/HR Place 1 patch every 3 days to the skin 12/22/16  Yes Ennever, Rudell Cobb, MD  Multiple Vitamin  (MULTIVITAMIN WITH MINERALS) TABS tablet Take 1 tablet by mouth daily.   Yes [provider]     History reviewed. No pertinent family history.  Social History   Social History  . Marital status: Legally Separated    Spouse name: N/A  . Number of children: N/A  . Years of education: N/A   Social History Main Topics  . Smoking status: Former Smoker    Packs/day: 1.00    Years: 40.00    Types: Cigarettes    Start date: 11/08/1960    Quit date: 09/01/1999  . Smokeless tobacco: Former Systems developer    Types: [place] date: 09/01/1999     Comment: quit in 2011  . Alcohol use No     Comment: quit in 2001  . Drug use: No     Comment: back in the day used cocaine,alcohol, marijuana  . Sexual activity: Not Asked   Other Topics Concern  . None   Social History Narrative  . None    Review of Systems: A 12 point ROS discussed and pertinent positives are indicated in the HPI above.  All other systems are negative.  Review of Systems  Constitutional: Positive for activity change, appetite change and fatigue. Negative for fever.  Respiratory: Negative for shortness of breath.   Cardiovascular: Negative for chest pain.  Neurological: Positive for weakness.  Psychiatric/Behavioral: Negative for behavioral problems and confusion.    Vital Signs: BP 103/89 (  BP Location: Right Arm)   Pulse 100   Temp 98 F (36.7 C) (Oral)   Resp (!) 25   Ht 5\' 6"  (1.676 m)   Wt 121 lb 12.8 oz (55.2 kg)   SpO2 100%   BMI 19.66 kg/m   Physical Exam  Constitutional: He is oriented to person, place, and time.  Cardiovascular: Normal rate and regular rhythm.   Pulmonary/Chest: Effort normal and breath sounds normal.  Abdominal: Soft. Bowel sounds are normal.  G tube intact Clean and dry NT no bleeding  Musculoskeletal: Normal range of motion.  Neurological: He is alert and oriented to person, place, and time.  Skin: Skin is warm and dry.  Rt PAC intact- in use Skin site seems clean and  dry No bleeding; no hematoma No sign of infection  Psychiatric: He has a normal mood and affect. His behavior is normal. Judgment and thought content normal.  Nursing note and vitals reviewed.   Mallampati Score:  MD Evaluation Airway: WNL Heart: WNL Abdomen: WNL Chest/ Lungs: WNL ASA  Classification: 3 Mallampati/Airway Score: One  Imaging: Ct Abdomen Wo Contrast  Result Date: 01/12/2017 CLINICAL DATA:  Dysphagia, esophagitis, malnutrition and assessment for possible percutaneous gastrostomy tube placement. EXAM: CT ABDOMEN WITHOUT CONTRAST TECHNIQUE: Multidetector CT imaging of the abdomen was performed following the standard protocol without IV contrast. COMPARISON:  None. FINDINGS: Lower chest: Dense consolidation of both lower lobes, left greater than right, with associated small bilateral pleural effusions. Hepatobiliary: Unenhanced appearance of the liver and gallbladder are unremarkable. Pancreas: Unremarkable unenhanced appearance. Spleen: Normal size.  Unremarkable appearance. Adrenals/Urinary Tract: No hydronephrosis. Stomach/Bowel: Feeding tube extends into the proximal stomach. The stomach is anteriorly positioned and superior to the transverse colon. Anatomy would not be prohibitive to percutaneous gastrostomy. There is no evidence of bowel obstruction or significant ileus. No free air identified. Vascular/Lymphatic: No enlarged lymph nodes. Calcified aorta without evidence of aneurysm. Other: Anasarca.  No focal abnormal fluid collections identified. Musculoskeletal: Unremarkable. IMPRESSION: 1. Anatomy is not prohibitive to percutaneous gastrostomy tube placement. 2. Consolidation of both lower lobes, left greater than right with associated small bilateral pleural effusions. Electronically Signed   By: Aletta Edouard M.D.   On: 01/12/2017 08:01   Dg Chest 2 View  Result Date: 01/07/2017 CLINICAL DATA:  Street shortness of breath, follow-up from chest x-ray of Jan 02, 2017 at  which time pulmonary edema or less likely pneumonia was suspected. EXAM: CHEST  2 VIEW COMPARISON:  PA and lateral chest x-ray of Jan 02, 2017 FINDINGS: The lungs are adequately inflated. There are increased confluent alveolar opacities in the right mid and lower lung and in the left mid and lower lung. The interstitial markings are mildly increased. The cardiac silhouette remains enlarged. There is no significant left pleural effusion. There is a small right pleural effusion. There is calcification in the wall of the aortic arch. The power port catheter tip projects over the junction of the middle and distal thirds of the SVC. IMPRESSION: Worsening of airspace opacities compatible with pneumonia or asymmetric pulmonary edema though I favor the former. Mild cardiomegaly without pulmonary vascular congestion. Thoracic aortic atherosclerosis. Electronically Signed   By: David  Martinique M.D.   On: 01/07/2017 14:06   Dg Chest 2 View  Result Date: 01/02/2017 CLINICAL DATA:  Dyspnea, onset yesterday and worsening. EXAM: CHEST  2 VIEW COMPARISON:  10/09/2015 FINDINGS: Right-sided port extends into the low SVC. There are pleural effusions bilaterally, new. There is mild interstitial fluid and  vascular prominence. Central and basilar airspace opacities are present. IMPRESSION: Interstitial and alveolar edema. New small pleural effusions. Cannot exclude infectious infiltrates. Electronically Signed   By: Andreas Newport M.D.   On: 01/02/2017 06:39   Dg Abd 1 View  Result Date: 01/08/2017 CLINICAL DATA:  Feeding tube placement. EXAM: ABDOMEN - 1 VIEW COMPARISON:  12/03/2013 FINDINGS: Enteric feeding tube tip projects in the stomach. There is residual contrast within the stomach, also within the small bowel and colon. Interstitial hazy airspace opacities are noted in the visualized lower lungs, with evidence of small pleural effusions. IMPRESSION: 1. Enteric feeding tube tip projects in the mid stomach. Electronically  Signed   By: Lajean Manes M.D.   On: 01/08/2017 15:47   Ir Gastrostomy Tube Mod Sed  Result Date: 01/13/2017 CLINICAL DATA:  Esophagitis. History of esophageal carcinoma. Dysphagia, malnutrition, needs enteral feeding support. EXAM: PERC PLACEMENT GASTROSTOMY FLUOROSCOPY TIME:  39 uGym2 DAP, 2 minutes 30 seconds TECHNIQUE: The procedure, risks, benefits, and alternatives were explained to the patient. Questions regarding the procedure were encouraged and answered. The patient understands and consents to the procedure. Patient was receiving adequate prophylactic antibiotic coverage.Progression of previously administered oral barium into the colon was confirmed fluoroscopically. A 5 French angiographic catheter was placed as orogastric tube. The upper abdomen was prepped with Betadine, draped in usual sterile fashion, and infiltrated locally with 1% lidocaine. Intravenous Fentanyl and Versed were administered as conscious sedation during continuous monitoring of the patient's level of consciousness and physiological / cardiorespiratory status by the radiology RN, with a total moderate sedation time of 11 minutes. Stomach was insufflated using air through the orogastric tube. 0.5 mg glucagon given IV to facilitate gastric distention. An 90 French sheath needle was advanced percutaneously into the gastric lumen under fluoroscopy. Gas could be aspirated and a small contrast injection confirmed intraluminal spread. The sheath was exchanged over a guidewire for a 9 [place] vascular sheath, through which the snare device was advanced and used to snare a guidewire passed through the orogastric tube. This was withdrawn, and the snare attached to the 20 French pull-through gastrostomy tube, which was advanced antegrade, positioned with the internal bumper securing the anterior gastric wall to the anterior abdominal wall. Small contrast injection confirms appropriate positioning. The external bumper was applied and the  catheter was flushed. COMPLICATIONS: COMPLICATIONS none IMPRESSION: 1. Technically successful 20 French pull-through gastrostomy placement under fluoroscopy. Electronically Signed   By: Lucrezia Europe M.D.   On: 01/13/2017 10:50   US Renal  Result Date: 01/04/2017 CLINICAL DATA:  Acute renal insufficiency.  Hypertension. EXAM: RENAL / URINARY TRACT ULTRASOUND COMPLETE COMPARISON:  None. FINDINGS: Right Kidney: Length: 10.1 cm. Echogenicity is increased. Renal cortical thickness is normal. No mass, perinephric fluid, or hydronephrosis visualized. No sonographically demonstrable calculus or ureterectasis. Left Kidney: Length: 10.8 cm. Echogenicity is slightly increased. Renal cortical thickness is normal. No perinephric fluid or hydronephrosis visualized. There is a predominantly cystic mass arising from the mid left kidney measuring 3.6 x 3.0 x 3.0 cm. There is a focal area of increased echogenicity along the periphery of this cystic lesion. There is an extrarenal pelvis on the left. No sonographically demonstrable calculus or ureterectasis. Bladder: Appears normal for degree of bladder distention. Prostate measures 5 x 3 cm. Prostate is mildly inhomogeneous in appearance. There is a right pleural effusion. IMPRESSION: Each kidney is echogenic, a finding that may be seen with medical renal disease. There is no hydronephrosis on either side. There is an  extrarenal pelvis on the left. Complex left cystic mass within an echogenic focus along the periphery of this mass. This mass warrants additional surveillance given the echogenic focus along the periphery. Further evaluation with pre and post contrast MRI or CT should be considered. MRI is preferred in younger patients (due to lack of ionizing radiation) and for evaluating calcified lesion(s). Prostate mildly prominent and mildly inhomogeneous in appearance. This finding warrants correlation with PSA and clinical examination. Right pleural effusion. Electronically  Signed   By: Lowella Grip III M.D.   On: 01/04/2017 16:03   Dg Chest Port 1 View  Result Date: 01/10/2017 CLINICAL DATA:  Acute respiratory failure with hypoxia EXAM: PORTABLE CHEST 1 VIEW COMPARISON:  01/09/2017 CXR FINDINGS: Lung volumes are slightly lower than on prior exam. Patchy consolidations are noted at each lung base and right upper lobe with probable small left effusion and mild interstitial edema. No change in port catheter tip position at the cavoatrial junction nor with respect to a feeding tube seen with tip in the expected location of the body of the stomach. The heart is obscured by airspace opacities in left effusion. Aortic atherosclerosis at the arch. IMPRESSION: Persistent patchy bibasilar and right upper lobe airspace opacities likely to reflect changes of pneumonia and/or CHF. Low lung volumes with probable small left pleural effusion. Stable support line and tube positions. Electronically Signed   By: Ashley Royalty M.D.   On: 01/10/2017 23:02   Dg Chest Port 1 View  Result Date: 01/09/2017 CLINICAL DATA:  Respiratory failure EXAM: PORTABLE CHEST 1 VIEW COMPARISON:  01/08/2017 FINDINGS: Progression of bibasilar airspace disease, possible pneumonia. Small pleural effusions. Interval placement of feeding tube in the stomach. Port-A-Cath tip in the SVC unchanged. IMPRESSION: Progression of bibasilar infiltrates, possible pneumonia or edema. Feeding tube in the stomach. Electronically Signed   By: Franchot Gallo M.D.   On: 01/09/2017 08:19   Dg Chest Port 1 View  Result Date: 01/08/2017 CLINICAL DATA:  Respiratory failure.  Esophageal cancer. EXAM: PORTABLE CHEST 1 VIEW COMPARISON:  Yesterday FINDINGS: Bilateral central and basilar airspace disease is not significantly changed allowing for differences in patient position. Heart is upper normal in size. Right jugular Port-A-Cath is stable. No pneumothorax. IMPRESSION: Stable bilateral airspace disease in a pulmonary edema or ARDS  pattern. Electronically Signed   By: Marybelle Killings M.D.   On: 01/08/2017 07:45   Dg Swallowing Func-speech Pathology  Result Date: 01/08/2017 Objective Swallowing Evaluation: Type of Study: MBS-Modified Barium Swallow Study Patient Details Name: HADYN BLANCK MRN: [id number] Date of Birth: 1951/05/02 Today's Date: 01/08/2017 Time: SLP Start Time (ACUTE ONLY): 1000-SLP Stop Time (ACUTE ONLY): 1030 SLP Time Calculation (min) (ACUTE ONLY): 30 min Past Medical History: Past Medical History: Diagnosis Date . Cancer [facility])   Esophagus- radiation 2011 . GERD (gastroesophageal reflux disease)   takes zantac prn . Headache  . History of radiation therapy 10/19/2013-12/05/2013  pyriform sinus 69.96 Gy/12fx . HOH (hard of hearing)  . Hyperlipemia  . Hypertension  . Seizures ([place])   alcohol withdrawls- 2001 Past Surgical History: Past Surgical History: Procedure Laterality Date . CARDIOVASCULAR STRESS TEST  10/09/09  normal nuclearr stress test, EF 57% [name]) . LARYNGOSCOPY N/A 09/15/2013  Procedure: LARYNGOSCOPY;  Surgeon: [name], MD;  Location: [place];  Service: ENT;  Laterality: N/A;  direct laryngoscopy with biopsy and esophagoscopy . NO PAST SURGERIES   . 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 CXR significant for pulmonary edema. Treated with IV lasix. Cardiology was consulted and patient underwent right heart cath on 5/18 in which revealed multivessel CAD with plans to do PCI of RCA next week if renal function is stable. On 5/10  patient became tachypneic and hypoxic, given 40 mg IV lasix with 336ml output, with improvement. CXR shows worsening airspace opacities compatible with PNA or asymmetric pulmonary edema, mild cardiomegaly without vascular congestion. Pt was seen in OP SLP tx over 11 months, 4 visits. Pt reported that swallowing issues had resolved, but was  noncompliant with exercise program. Pt had G tube during RXT, it was removed in 2015.  In early 2016 SLP reported consistent throat clearing/cough observed with thin liquids, small sip chin tuck and effortful swallow x2 eliminated cough. No MBS or FEES on record.  No Data Recorded Assessment / Plan / Recommendation CHL IP CLINICAL IMPRESSIONS 01/08/2017 Clinical Impression Pt demonstrates severe to profound oropharyngeal dysphagia secondary to fibrotic musculature following RXT in 2015 without compliance to recommendations for exercise program. Due to severe lack of mobility of hyolaryngeal complex and pharyngeal musculature there is minimal ability to protect airway during the swallow leading to immediate severe penetration to the cords before/during the swallow. Pt does appear to achieve moderate glottic closure to hold penetrates in the vestibule, but as soon as he releases the swallow, they spill into trachea. There is absent sensation from pt. In addition, due to lack of pharyngeal peristalsis and hyolaryngeal excursion, at least 50% of the bolus remains pooled in the vallecula and pyriforms post swallow, with increased severity with thick liquids. Attempted all positional compensations without success. Best strategy was a super supraglottic swallow to capitilaze on glottic closure and expectoration of penetrate. Even with this strategy, severe aspiration occurs before and after attempts. Pt verbalizes desire to continue all treatments to prolong life and would not want to accept risk of aspiration. Given dx of pna and need for further medical interventions, aggressive  prevention of aspiration of PO is recommended. Suggest short term alternate means of nutrition until cardiac procedure is complete and pts respiratory status is somewhat improved.  Recommend ice chips after oral care only at this time.  Will plan to retest next week, but expect poor spontaneous recovery or potential to improve with therapy over the short term given length of time since RXT and severity of impairment.  SLP Visit Diagnosis Dysphagia, oropharyngeal phase (R13.12) Attention and concentration deficit following -- Frontal lobe and executive function deficit following -- Impact on safety and function Severe aspiration risk;Risk for inadequate nutrition/hydration   CHL IP TREATMENT RECOMMENDATION 01/08/2017 Treatment Recommendations Therapy as outlined in treatment plan below   Prognosis 01/08/2017 Prognosis for Safe Diet Advancement Guarded Barriers to Reach Goals Severity of deficits;Time post onset Barriers/Prognosis Comment -- CHL IP DIET RECOMMENDATION 01/08/2017 SLP Diet Recommendations NPO;Alternative means - temporary;Ice chips PRN after oral care Liquid Administration via -- Medication Administration Via alternative means Compensations -- Postural Changes --   CHL IP OTHER RECOMMENDATIONS 01/08/2017 Recommended Consults -- Oral Care Recommendations Oral care before and after PO Other Recommendations --  CHL IP FOLLOW UP RECOMMENDATIONS 01/08/2017 Follow up Recommendations Skilled Nursing facility   [facility] IP FREQUENCY AND DURATION 01/08/2017 Speech Therapy Frequency (ACUTE ONLY) min 2x/week Treatment Duration 2 weeks      CHL IP ORAL PHASE 01/08/2017 Oral Phase Impaired Oral - Pudding Teaspoon -- Oral - Pudding Cup -- Oral - Honey Teaspoon -- Oral - Honey Cup -- Oral - Nectar Teaspoon -- Oral - Nectar Cup -- Oral - Nectar Straw -- Oral - Thin Teaspoon -- Oral - Thin Cup -- Oral - Thin Straw -- Oral - Puree -- Oral - Mech Soft -- Oral - Regular -- Oral - Multi-Consistency -- Oral - Pill -- Oral Phase -  Comment --  CHL IP PHARYNGEAL PHASE 01/08/2017 Pharyngeal Phase Impaired Pharyngeal- Pudding Teaspoon -- Pharyngeal -- Pharyngeal- Pudding Cup -- Pharyngeal -- Pharyngeal- Honey Teaspoon Delayed swallow initiation-pyriform sinuses;Reduced pharyngeal peristalsis;Reduced epiglottic inversion;Reduced anterior laryngeal mobility;Reduced laryngeal elevation;Reduced airway/laryngeal closure;Reduced tongue base retraction;Penetration/Aspiration before swallow;Penetration/Aspiration during swallow;Penetration/Apiration after swallow;Significant aspiration (Amount);Pharyngeal residue - valleculae;Pharyngeal residue - pyriform Pharyngeal -- Pharyngeal- Honey Cup -- Pharyngeal -- Pharyngeal- Nectar Teaspoon -- Pharyngeal -- Pharyngeal- Nectar Cup Delayed swallow initiation-pyriform sinuses;Reduced pharyngeal peristalsis;Reduced epiglottic inversion;Reduced anterior laryngeal mobility;Reduced laryngeal elevation;Reduced airway/laryngeal closure;Reduced tongue base retraction;Penetration/Aspiration before swallow;Penetration/Aspiration during swallow;Penetration/Apiration after swallow;Significant aspiration (Amount);Pharyngeal residue - valleculae;Pharyngeal residue - pyriform Pharyngeal -- Pharyngeal- Nectar Straw -- Pharyngeal -- Pharyngeal- Thin Teaspoon -- Pharyngeal -- Pharyngeal- Thin Cup Delayed swallow initiation-pyriform sinuses;Reduced pharyngeal peristalsis;Reduced epiglottic inversion;Reduced anterior laryngeal mobility;Reduced laryngeal elevation;Reduced airway/laryngeal closure;Reduced tongue base retraction;Penetration/Aspiration before swallow;Penetration/Aspiration during swallow;Penetration/Apiration after swallow;Significant aspiration (Amount);Pharyngeal residue - valleculae;Pharyngeal residue - pyriform Pharyngeal -- Pharyngeal- Thin Straw -- Pharyngeal -- Pharyngeal- Puree Reduced pharyngeal peristalsis;Reduced epiglottic inversion;Reduced anterior laryngeal mobility;Reduced laryngeal elevation;Reduced  airway/laryngeal closure;Reduced tongue base retraction;Penetration/Apiration after swallow;Significant aspiration (Amount);Pharyngeal residue - valleculae;Pharyngeal residue - pyriform;Delayed swallow initiation-vallecula Pharyngeal -- Pharyngeal- Mechanical Soft -- Pharyngeal -- Pharyngeal- Regular -- Pharyngeal -- Pharyngeal- Multi-consistency -- Pharyngeal -- Pharyngeal- Pill -- Pharyngeal -- Pharyngeal Comment --  CHL IP CERVICAL ESOPHAGEAL PHASE 01/08/2017 Cervical Esophageal Phase Impaired Pudding Teaspoon -- Pudding Cup -- Honey Teaspoon -- Honey Cup -- Nectar Teaspoon -- Nectar Cup -- Nectar Straw -- Thin Teaspoon -- Thin Cup -- Thin Straw -- Puree -- Mechanical Soft -- Regular -- Multi-consistency -- Pill -- Cervical Esophageal Comment reduced CP opening No flowsheet data found. Herbie Baltimore, MA CCC-SLP [phone] Lynann Beaver 01/08/2017, 10:54 AM               Labs:  CBC:  Recent Labs  01/11/17 0523 01/12/17 0446 01/13/17 0456 01/14/17 0608  WBC 12.6* 13.6* 14.6* 13.7*  HGB 10.9* 11.7* 12.6* 12.5*  HCT 33.3* 35.7* 38.8* 38.1*  PLT 172 193 234 283    COAGS:  Recent Labs  01/04/17 0511 01/14/17 0900  INR 1.03  --   APTT  --  30    BMP:  Recent Labs  01/10/17 0500 01/11/17 0523 01/12/17 0446 01/13/17 0456  NA 130* 133* 130* 130*  K 3.8 4.0 3.9 4.7  CL 92* 96* 95* 95*  CO2 33* 29 28 26   GLUCOSE 154* 127* 153* 113*  BUN 30* 24* 20 20  CALCIUM 7.9* 7.4* 7.8* 8.2*  CREATININE 1.15 0.91 0.83 0.85  GFRNONAA >60 >60 >60 >60  GFRAA >60 >60 >60 >60    LIVER FUNCTION TESTS:  Recent Labs  03/05/16 1334 01/02/17 0658  BILITOT <0.30 0.4  AST 26 39  ALT 20 25  ALKPHOS 42 37*  PROT 7.4 7.1  ALBUMIN 3.7 3.9    TUMOR MARKERS: No results for input(s): AFPTM, CEA, CA199, CHROMGRNA in the last 8760 hours.  Assessment and Plan:  May use Gastric tube now Scheduled for Port a Cath removal in IR Pt is aware of procedure benefits and risks including but  nnot limited to: Infection; bleeding; vessel damage Agreeable to proceed Consent signed andin chart  Thank you for this interesting consult.  I greatly enjoyed meeting RAVINDRA BARANEK and look forward to participating in their care.  A copy of this report was sent to the requesting provider on this date.  Electronically Signed: Khamarion Bjelland A 01/14/2017, 9:33 AM   I spent a total of 20 Minutes    in face to face in clinical consultation, greater than 50% of which was counseling/coordinating care for [facility] removal

## 2017-01-15 ENCOUNTER — Encounter (HOSPITAL_COMMUNITY): Payer: Self-pay | Admitting: Interventional Radiology

## 2017-01-15 ENCOUNTER — Inpatient Hospital Stay (HOSPITAL_COMMUNITY): Payer: Medicare Other

## 2017-01-15 DIAGNOSIS — R0602 Shortness of breath: Secondary | ICD-10-CM

## 2017-01-15 HISTORY — PX: IR REMOVAL TUN ACCESS W/ PORT W/O FL MOD SED: IMG2290

## 2017-01-15 LAB — CBC
HCT: 38.9 % — ABNORMAL LOW (ref 39.0–52.0)
Hemoglobin: 12.6 g/dL — ABNORMAL LOW (ref 13.0–17.0)
MCH: 27.4 pg (ref 26.0–34.0)
MCHC: 32.4 g/dL (ref 30.0–36.0)
MCV: 84.6 fL (ref 78.0–100.0)
PLATELETS: 316 10*3/uL (ref 150–400)
RBC: 4.6 MIL/uL (ref 4.22–5.81)
RDW: 14.3 % (ref 11.5–15.5)
WBC: 13.8 10*3/uL — AB (ref 4.0–10.5)

## 2017-01-15 LAB — GLUCOSE, CAPILLARY
GLUCOSE-CAPILLARY: 121 mg/dL — AB (ref 65–99)
GLUCOSE-CAPILLARY: 134 mg/dL — AB (ref 65–99)
GLUCOSE-CAPILLARY: 155 mg/dL — AB (ref 65–99)
Glucose-Capillary: 124 mg/dL — ABNORMAL HIGH (ref 65–99)
Glucose-Capillary: 104 mg/dL — ABNORMAL HIGH (ref 65–99)
Glucose-Capillary: 106 mg/dL — ABNORMAL HIGH (ref 65–99)

## 2017-01-15 LAB — BASIC METABOLIC PANEL
ANION GAP: 11 (ref 5–15)
BUN: 26 mg/dL — AB (ref 6–20)
CALCIUM: 8.2 mg/dL — AB (ref 8.9–10.3)
CO2: 26 mmol/L (ref 22–32)
Chloride: 96 mmol/L — ABNORMAL LOW (ref 101–111)
Creatinine, Ser: 1.07 mg/dL (ref 0.61–1.24)
GFR calc Af Amer: >60 mL/min (ref >60)
GLUCOSE: 121 mg/dL — AB (ref 65–99)
POTASSIUM: 4 mmol/L (ref 3.5–5.1)
SODIUM: 133 mmol/L — AB (ref 135–145)

## 2017-01-15 LAB — TROPONIN I: TROPONIN I: 0.09 ng/mL — AB (ref <0.03)

## 2017-01-15 MED ORDER — JEVITY 1.2 CAL PO LIQD
1000.0000 mL | ORAL | Status: DC
  Administered 2017-01-15: 25 mL/h
  Administered 2017-01-16: 1000 mL
  Filled 2017-01-15 (×4): qty 1000

## 2017-01-15 MED ORDER — MIDAZOLAM HCL 2 MG/2ML IJ SOLN
INTRAMUSCULAR | Status: AC | PRN
  Administered 2017-01-15: 1 mg via INTRAVENOUS

## 2017-01-15 MED ORDER — MIDAZOLAM HCL 2 MG/2ML IJ SOLN
INTRAMUSCULAR | Status: AC
  Filled 2017-01-15: qty 2

## 2017-01-15 MED ORDER — SPIRONOLACTONE 25 MG PO TABS
25.0000 mg | ORAL_TABLET | Freq: Every day | ORAL | Status: DC
  Administered 2017-01-15: 25 mg via ORAL
  Filled 2017-01-15 (×2): qty 1

## 2017-01-15 MED ORDER — FENTANYL CITRATE (PF) 100 MCG/2ML IJ SOLN
INTRAMUSCULAR | Status: AC | PRN
  Administered 2017-01-15: 50 ug via INTRAVENOUS

## 2017-01-15 MED ORDER — ISOSORBIDE MONONITRATE ER 30 MG PO TB24
30.0000 mg | ORAL_TABLET | Freq: Every day | ORAL | Status: DC
  Administered 2017-01-15: 30 mg via ORAL
  Filled 2017-01-15 (×2): qty 1

## 2017-01-15 MED ORDER — LIDOCAINE HCL 1 % IJ SOLN
INTRAMUSCULAR | Status: AC
  Filled 2017-01-15: qty 20

## 2017-01-15 MED ORDER — FUROSEMIDE 40 MG PO TABS
40.0000 mg | ORAL_TABLET | Freq: Every day | ORAL | Status: DC
  Filled 2017-01-15: qty 1

## 2017-01-15 MED ORDER — CHLORHEXIDINE GLUCONATE 4 % EX LIQD
CUTANEOUS | Status: AC
  Filled 2017-01-15: qty 15

## 2017-01-15 MED ORDER — FENTANYL CITRATE (PF) 100 MCG/2ML IJ SOLN
INTRAMUSCULAR | Status: AC
  Filled 2017-01-15: qty 2

## 2017-01-15 NOTE — Sedation Documentation (Addendum)
Dr Hassell at bedside. 

## 2017-01-15 NOTE — Sedation Documentation (Signed)
Prep complete, ready for MD

## 2017-01-15 NOTE — Progress Notes (Signed)
[facility] for Infectious Disease    Date of Admission:  01/02/2017   Total days of antibiotics 9        Day 5 cefazolin           ID: Collin Young is a 66 y.o. male with  Hx of esophageal ca s/p chemo and radiation with portacath admitted for heart failure found to have 3V CAD requiring PCI but also found to have MSSA bacteremia and aspiration pneumonia Principal Problem:   Staphylococcus aureus bacteremia Active Problems:   Malignant neoplasm of pyriform sinus (HCC)   Acute combined systolic and diastolic heart failure (HCC)   AKI (acute kidney injury) ([name])   Acute pulmonary edema (HCC)   Congestive dilated cardiomyopathy (HCC)   Acute congestive heart failure (HCC)   Ischemic cardiomyopathy   Acute respiratory failure with hypoxia ([name])   Palliative care by specialist   Dysphagia   Aspiration pneumonia of both lower lobes due to gastric secretions ([name])   Encounter for feeding tube placement   Angina decubitus ([name])   Coronary artery disease of bypass graft of native heart with stable angina pectoris (HCC)   SOB (shortness of breath)    Subjective: Remains afebrile, no fevers noted . He is down in IR for portacath removal  Medications:  . aspirin EC  81 mg Oral Daily  . atorvastatin  20 mg Oral Daily  . budesonide (PULMICORT) nebulizer solution  0.5 mg Nebulization BID  . carvedilol  6.25 mg Oral BID WC  . chlorhexidine      . fentaNYL      . [START ON 01/16/2017] furosemide  40 mg Oral Daily  . isosorbide mononitrate  30 mg Oral Daily  . lidocaine      . losartan  12.5 mg Oral Daily  . midazolam      . multivitamin with minerals  1 tablet Oral Daily  . sodium chloride flush  3 mL Intravenous Q12H  . spironolactone  25 mg Oral Daily  . zolpidem  5 mg Oral Once    Objective: Vital signs in last 24 hours: Temp:  [97.2 F (36.2 C)-98.7 F (37.1 C)] 98.2 F (36.8 C) (05/18 1300) Pulse Rate:  [80-108] 80 (05/18 1455) Resp:  [13-28] 16 (05/18  1455) BP: (119-142)/(67-97) 130/78 (05/18 1455) SpO2:  [95 %-100 %] 99 % (05/18 1455) Weight:  [118 lb 12.8 oz (53.9 kg)] 118 lb 12.8 oz (53.9 kg) (05/18 0552) Did not examine   Lab Results  Recent Labs  01/14/17 0608 01/14/17 0900 01/15/17 0458  WBC 13.7*  --  13.8*  HGB 12.5*  --  12.6*  HCT 38.1*  --  38.9*  NA  --  133* 133*  K  --  4.6 4.0  CL  --  99* 96*  CO2  --  24 26  BUN  --  20 26*  CREATININE  --  0.98 1.07    Microbiology: 5/14 blood cx NGTD 5/12 blood cx x 1 MSSA 5/10 blood cx 1 of 2 MSSA Studies/Results: Dg Chest Port 1 View  Result Date: 01/14/2017 CLINICAL DATA:  Cough and shortness of breath EXAM: PORTABLE CHEST 1 VIEW COMPARISON:  Jan 10, 2017 FINDINGS: There are pleural effusions bilaterally with bibasilar airspace consolidation. There is cardiomegaly with pulmonary venous hypertension. Port-A-Cath tip is in the superior vena cava near the cavoatrial junction. No pneumothorax. There is aortic atherosclerosis. No adenopathy. No bone lesions. IMPRESSION: Evidence of a degree of congestive heart  failure. Suspect superimposed pneumonia in the lung bases. In comparison with the recent study, there is increased opacity in the right base. Changes on the left are stable. There is aortic atherosclerosis. Port-A-Cath position unchanged. No pneumothorax. Electronically Signed   By: Lowella Grip III M.D.   On: 01/14/2017 14:22     Assessment/Plan: Complicated MSSA bacteremia = patient is getting port a cath removal as this is likely colonized with MSSA and switch to getting picc line for the remaining course of his IV abtx.   Recommend to treat for 4 wk. Using the day of removal of portacath as a day 1 of 28.  Dr comer available for questions over the weekend. Will see back on monday   Collin Young, [facility] for Infectious Diseases Cell: [phone] Pager: [phone]  01/15/2017, 3:00 PM

## 2017-01-15 NOTE — Sedation Documentation (Signed)
Patient is resting comfortably. 

## 2017-01-15 NOTE — Progress Notes (Signed)
  Speech Language Pathology Treatment: Dysphagia  Patient Details Name: Collin Young MRN: [id number] DOB: Oct 17, 1950 Today's Date: 01/15/2017 Time: 4076-8088 SLP Time Calculation (min) (ACUTE ONLY): 21 min  Assessment / Plan / Recommendation Clinical Impression  Pt seen at bedside. Pt completed oral care after set up. Following oral care, pt self fed ice chips with intermittent throat clear noted. SLP educated regarding proper technique of CTAR, and encouraged effortful swallow with ice chip trials. PEG in place, feeds pending. ST will continue to follow for dysphagia therapy and education.   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 CXR significant for pulmonary edema. Treated with IV lasix. Cardiology was consulted and patient underwent right heart cath on 5/18 in which revealed multivessel CAD with plans to do PCI of RCA next week if renal function is stable. On 5/10 patient became tachypneic and hypoxic, given 40 mg IV lasix with 352ml output, with improvement. CXR shows worsening airspace opacities compatible with PNA or asymmetric pulmonary edema, mild cardiomegaly without vascular congestion. Pt was seen in OP SLP tx over 11 months, 4 visits. Pt reported that swallowing issues had resolved, but was  noncompliant with exercise program. Pt had G tube during RXT, it was removed in 2015.  In early 2016 SLP reported consistent throat clearing/cough observed with thin liquids, small sip chin tuck and effortful swallow x2 eliminated cough. No MBS or FEES on record.       SLP Plan  Continue with current plan of care       Recommendations  Diet recommendations: NPO Medication Administration: Via alternative means Compensations: Effortful  swallow (with ice chips (following oral care))                Oral Care Recommendations: Oral care QID Follow up Recommendations: Skilled Nursing facility SLP Visit Diagnosis: Dysphagia, oropharyngeal phase (R13.12) Plan: Continue with current plan of care       Collin Young. Collin Young [facility], CCC-SLP [phone]  Collin Young 01/15/2017, 10:26 AM

## 2017-01-15 NOTE — Progress Notes (Signed)
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 He presented to the emergency department complaining of progressive shortness of breath for the past several weeks. He was admitted with concerns of CHF, with BNP significantly elevated at 2000. Echocardiogram revealed ejection fraction of 10-15% patient was started on beta blockers Lasix and Aldactone. Patient also on admission was found to be on acute kidney injury. Cardiology consulted, underwent cardiac cath showing significant multivessel disease. Recommended staged PCI when creatinine improves. Dysphagia was evaluated with recommendation per SLP for total NPO status, NG tube and reevaluation once clinical status improved. Blood cultures have been positive for MSSA and ID is making recommendations. Palliative care has been consulted and after family meeting, patient is DNR. PEG tube placed 5/16. Repeat blood cultures from 5/12 following initiation of effective abx grew MSSA again in ~48 hrs. Repeat cultures were drawn 5/14 and remain NGTD. Plan is to remove port due to concern for seeding. LHC thought to be prohibitively high risk, so plan is to wean milrinone, NTG as able. Experienced flash pulmonary edema with initial weaning attempting of NTG 5/17.   Subjective: Breathing "like it's supposed to be" per pt. No chest pain. No palpitations. Had severe shortness of breath yesterday improved with reinstatement of lasix and NTG.  Assessment & Plan: Acute respiratory failure with hypoxia: Due to ischemic cardiomyopathy/acute systolic CHF, pulmonary edema, and likely aspiration pneumonitis/pneumonia. CXR show worsening air space disease, pneumonia vs  pulm edema, he is afebrile and no leukocytosis.  - Resolved, now returned with weaning NTG. Improved w/lasix - Continue nebs per pulmonology - Continue antibiotics as below - Sputum culture was cancelled for unclear reasons.  Acute systolic heart failure: New diagnosis, due to multivessel CAD.  - NTG gtt for pulmonary edema, failed weaning attempt 5/17.  - Restarted lasix 40mg  IV q8h 5/17 and added low dose ARB (was on hold due to AKI). Monitor Cr and output.   - Daily weight, I&O's low salt diet. - Milrinone per cardiology, hopeful to wean. Not sure home inotrope is an option.  Ischemic cardiomyopathy: Left main and LAD with moderate disease and chronically occluded LCx in addition to 99% stenosis of RCA thought to be amenable to atherectomy/staged PCI pending renal function.  - ASA, statin, plavix. - LHC w/RCA intervention would be technically challenging and high risk, given his lack of angina, plan per cardiology is to consider PCI once more stable at follow up.  Severe aspiration: Related to h/o radiation to esophagus, MBSS 5/11 demonstrated frank aspiration of all consistencies. Cortrak was placed as bridge to PEG. - PEG placed 5/16.  - Nutrition consulted.   MSSA bacteremia: in 1 of 2 blood cultures from 5/10 and again on 5/12. PCT improving 10 > 2. 5/14 Blood cultures NGTD - ID following, on ancef.  - Plan to pull port, wait ~234hrs, place PICC, which will start day 1 of 28 of IV abx.   AKI: Suspect cardiorenal syndrome vs from contrast vs diuretics. Poor CO leading to poor kidney perfusion. FENa 0.64% suggestive of prerenal etiology. - Monitor daily, resolved  Complex left renal cyst: Warrants additional surveillance with MRI or CT with contrast will defer after patient stable given patient will receive multiple contrast doses  for cardiac cath. This can be follow as outpatient as well.   Enlarged prostate: Seen on U/S. No LUTS/BOO. PSA normal.  - No medications given lack of  symptoms.   Hypertension: Per patient was taken off his BP meds by PCP PTA  - Continue coreg and NTG gtt as tolerated   Squamous cell CA of the left piriform sinus and esophagus: In remission  - Continuing home fentanyl patch.   Hyponatremia: Suspect hypervolemia, improving, mild. - Monitor  DVT prophylaxis: Lovenox  Code Status: Full Family Communication: Daughter at bedside this AM Disposition Plan: Continue SDU level of care given milrinone, NTG drip titration. Transition to telemetry once off infusions.  Consultants:   Cardiology   PCCM  ID   Palliative care  IR  Procedures:   ECHO 01/03/17 - Left ventricle: The cavity size was normal. Wall thickness was   normal. The estimated ejection fraction was in the range of 10%   to 15%. Diffuse hypokinesis. DIastolic function is abnormal,   indeterminant grade. - Aortic valve: There was moderate regurgitation. Valve area (VTI):   1.08 cm^2. Valve area (Vmax): 1.5 cm^2. Valve area (Vmean): 1.5   cm^2. - Mitral valve: There was moderate regurgitation. - Left atrium: The atrium was mildly dilated. - Right ventricle: Systolic function was mildly to moderately   reduced. - Tricuspid valve: There was moderate regurgitation. - Pulmonary arteries: Systolic pressure was moderately increased.   PA peak pressure: 49 mm Hg (S).  Right/Left Heart Cath and Coronary Angiography 01/05/2017     Prox RCA to Mid RCA lesion, 99 %stenosed.  Mid RCA lesion, 30 %stenosed.  Dist RCA lesion, 40 %stenosed.  RPDA lesion, 20 %stenosed.  Prox Cx lesion, 100 %stenosed.  Ost Ramus to Ramus lesion, 50 %stenosed.  Ost 3rd Diag to 3rd Diag lesion, 30 %stenosed.  Mid LAD lesion, 40 %stenosed.  Ost LAD to Prox LAD lesion, 20 %stenosed.  Ost LM to LM lesion, 40 %stenosed.  Hemodynamic findings consistent with mild pulmonary hypertension.  1. Severe calcific disease in the proximal to mid RCA 2. Chronic total occlusion of the proximal  Circumflex 3. Moderate left main and mid LAD stenosis 4. Elevated filling pressures  Recommendations: He has multivessel CAD. I think the RCA can be approached with PCI but will likely require atherectomy prior to stenting. I would treat the remainder of his CAD medically. Will stage PCI given renal insufficiency. Hydrate gently tonight. May need diuresis tomorrow given elevated filling pressures. Would anticipate PCI on Thursday or Friday of this week if renal function remains stable.    PEG Placement 01/13/2017  Antimicrobials: Vancomycin 5/10 > 5/12 Cefepime 5/12 > 5/14  Ancef 5/14 >>   Objective: Vitals:   01/14/17 2300 01/15/17 0552 01/15/17 0743 01/15/17 0831  BP: 132/81 130/82  (!) 119/94  Pulse: 93 88  92  Resp: (!) 21 (!) 26  (!) 25  Temp: 98.7 F (37.1 C) 97.2 F (36.2 C)    TempSrc: Oral Axillary    SpO2: 100% 99% 97% 96%  Weight:  53.9 kg (118 lb 12.8 oz)    Height:        Intake/Output Summary (Last 24 hours) at 01/15/17 0910 Last data filed at 01/15/17 0720  Gross per 24 hour  Intake               20 ml  Output             2300 ml  Net            -  2280 ml   Filed Weights   01/13/17 0438 01/14/17 0301 01/15/17 0552  Weight: 56.4 kg (124 lb 6.4 oz) 55.2 kg (121 lb 12.8 oz) 53.9 kg (118 lb 12.8 oz)    Examination: General exam: Thin male in no distress appearing older than stated age Respiratory system: No accessory muscle use, 2L by [place]. Bibasilar crackles. Cardiovascular system: Regular, +S4 noted without murmur. No LE edema. Gastrointestinal system: Abd Soft NTND, +BS. PEG in situ LUQ nontender, no discharge. Extremities: No LE edema, no deformity Skin: No lesions or rash. Right upper chest port site c/d/i without erythema, still present.   Data Reviewed: I have personally reviewed following labs and imaging studies  CBC:  Recent Labs Lab 01/11/17 0523 01/12/17 0446 01/13/17 0456 01/14/17 0608 01/15/17 0458  WBC 12.6* 13.6* 14.6* 13.7* 13.8*    HGB 10.9* 11.7* 12.6* 12.5* 12.6*  HCT 33.3* 35.7* 38.8* 38.1* 38.9*  MCV 86.5 86.4 84.5 85.2 84.6  PLT 172 193 234 283 962   Basic Metabolic Panel:  Recent Labs Lab 01/11/17 0523 01/12/17 0446 01/13/17 0456 01/14/17 0900 01/15/17 0458  NA 133* 130* 130* 133* 133*  K 4.0 3.9 4.7 4.6 4.0  CL 96* 95* 95* 99* 96*  CO2 29 28 26 24 26   GLUCOSE 127* 153* 113* 77 121*  BUN 24* 20 20 20  26*  CREATININE 0.91 0.83 0.85 0.98 1.07  CALCIUM 7.4* 7.8* 8.2* 7.4* 8.2*   GFR: Estimated Creatinine Clearance: 51.8 mL/min (by C-G formula based on SCr of 1.07 mg/dL). Liver Function Tests: No results for input(s): AST, ALT, ALKPHOS, BILITOT, PROT, ALBUMIN in the last 168 hours. Recent Results (from the past 240 hour(s))  Culture, blood (Routine X 2) w Reflex to ID Panel     Status: None   Collection Time: 01/07/17  3:43 PM  Result Value Ref Range Status   Specimen Description BLOOD RIGHT ANTECUBITAL  Final   Special Requests IN PEDIATRIC BOTTLE Blood Culture adequate volume  Final   Culture NO GROWTH 5 DAYS  Final   Report Status 01/12/2017 FINAL  Final  Culture, blood (Routine X 2) w Reflex to ID Panel     Status: Abnormal   Collection Time: 01/07/17  3:47 PM  Result Value Ref Range Status   Specimen Description BLOOD RIGHT HAND  Final   Special Requests IN PEDIATRIC BOTTLE Blood Culture adequate volume  Final   Culture  Setup Time   Final    GRAM POSITIVE COCCI IN CLUSTERS IN PEDIATRIC BOTTLE CRITICAL RESULT CALLED TO, READ BACK BY AND VERIFIED WITH: Leonie Green Pharm.D. 13:30 01/08/17 ([email or web address])    Culture STAPHYLOCOCCUS AUREUS (A)  Final   Report Status 01/10/2017 FINAL  Final   Organism ID, Bacteria STAPHYLOCOCCUS AUREUS  Final      Susceptibility   Staphylococcus aureus - MIC*    CIPROFLOXACIN <=0.5 SENSITIVE Sensitive     ERYTHROMYCIN <=0.25 SENSITIVE Sensitive     GENTAMICIN <=0.5 SENSITIVE Sensitive     OXACILLIN 0.5 SENSITIVE Sensitive     TETRACYCLINE <=1 SENSITIVE Sensitive      VANCOMYCIN <=0.5 SENSITIVE Sensitive     TRIMETH/SULFA <=10 SENSITIVE Sensitive     CLINDAMYCIN <=0.25 SENSITIVE Sensitive     RIFAMPIN <=0.5 SENSITIVE Sensitive     Inducible Clindamycin NEGATIVE Sensitive     * STAPHYLOCOCCUS AUREUS  Blood Culture ID Panel (Reflexed)     Status: Abnormal   Collection Time: 01/07/17  3:47 PM  Result Value Ref Range Status  Enterococcus species NOT DETECTED NOT DETECTED Final   Listeria monocytogenes NOT DETECTED NOT DETECTED Final   Staphylococcus species DETECTED (A) NOT DETECTED Final    Comment: CRITICAL RESULT CALLED TO, READ BACK BY AND VERIFIED WITH: M. Radford Pax Pharm.D. 13:30 01/08/17 ([email or web address])    Staphylococcus aureus DETECTED (A) NOT DETECTED Final    Comment: Methicillin (oxacillin) susceptible Staphylococcus aureus (MSSA). Preferred therapy is anti staphylococcal beta lactam antibiotic (Cefazolin or Nafcillin), unless clinically contraindicated. CRITICAL RESULT CALLED TO, READ BACK BY AND VERIFIED WITH: M. Radford Pax Pharm.D. 13:30 01/08/17 ([email or web address])    Methicillin resistance NOT DETECTED NOT DETECTED Final   Streptococcus species NOT DETECTED NOT DETECTED Final   Streptococcus agalactiae NOT DETECTED NOT DETECTED Final   Streptococcus pneumoniae NOT DETECTED NOT DETECTED Final   Streptococcus pyogenes NOT DETECTED NOT DETECTED Final   Acinetobacter baumannii NOT DETECTED NOT DETECTED Final   Enterobacteriaceae species NOT DETECTED NOT DETECTED Final   Enterobacter cloacae complex NOT DETECTED NOT DETECTED Final   Escherichia coli NOT DETECTED NOT DETECTED Final   Klebsiella oxytoca NOT DETECTED NOT DETECTED Final   Klebsiella pneumoniae NOT DETECTED NOT DETECTED Final   Proteus species NOT DETECTED NOT DETECTED Final   Serratia marcescens NOT DETECTED NOT DETECTED Final   Haemophilus influenzae NOT DETECTED NOT DETECTED Final   Neisseria meningitidis NOT DETECTED NOT DETECTED Final   Pseudomonas aeruginosa NOT DETECTED NOT DETECTED Final    Candida albicans NOT DETECTED NOT DETECTED Final   Candida glabrata NOT DETECTED NOT DETECTED Final   Candida krusei NOT DETECTED NOT DETECTED Final   Candida parapsilosis NOT DETECTED NOT DETECTED Final   Candida tropicalis NOT DETECTED NOT DETECTED Final  MRSA PCR Screening     Status: None   Collection Time: 01/07/17  5:21 PM  Result Value Ref Range Status   MRSA by PCR NEGATIVE NEGATIVE Final    Comment:        The GeneXpert MRSA Assay (FDA approved for NASAL specimens only), is one component of a comprehensive MRSA colonization surveillance program. It is not intended to diagnose MRSA infection nor to guide or monitor treatment for MRSA infections.   Culture, blood (Routine X 2) w Reflex to ID Panel     Status: Abnormal   Collection Time: 01/09/17 10:15 AM  Result Value Ref Range Status   Specimen Description BLOOD RIGHT HAND  Final   Special Requests   Final    Blood Culture results may not be optimal due to an inadequate volume of blood received in culture bottles   Culture  Setup Time   Final    GRAM POSITIVE COCCI IN CLUSTERS Organism ID to follow IN PEDIATRIC BOTTLE CRITICAL RESULT CALLED TO, READ BACK BY AND VERIFIED WITH: N. JOHNSTON PHARMD, AT 0848 01/10/17 BY D. VANHOOK    Culture STAPHYLOCOCCUS AUREUS (A)  Final   Report Status 01/12/2017 FINAL  Final   Organism ID, Bacteria STAPHYLOCOCCUS AUREUS  Final      Susceptibility   Staphylococcus aureus - MIC*    CIPROFLOXACIN <=0.5 SENSITIVE Sensitive     ERYTHROMYCIN <=0.25 SENSITIVE Sensitive     GENTAMICIN <=0.5 SENSITIVE Sensitive     OXACILLIN <=0.25 SENSITIVE Sensitive     TETRACYCLINE <=1 SENSITIVE Sensitive     VANCOMYCIN <=0.5 SENSITIVE Sensitive     TRIMETH/SULFA <=10 SENSITIVE Sensitive     CLINDAMYCIN <=0.25 SENSITIVE Sensitive     RIFAMPIN <=0.5 SENSITIVE Sensitive     Inducible Clindamycin NEGATIVE Sensitive     *  STAPHYLOCOCCUS AUREUS  Blood Culture ID Panel (Reflexed)     Status: Abnormal    Collection Time: 01/09/17 10:15 AM  Result Value Ref Range Status   Enterococcus species NOT DETECTED NOT DETECTED Final   Listeria monocytogenes NOT DETECTED NOT DETECTED Final   Staphylococcus species DETECTED (A) NOT DETECTED Final    Comment: CRITICAL RESULT CALLED TO, READ BACK BY AND VERIFIED WITH: V. JOHNSTON PHARMD, AT 0848 01/10/17 BY D. VANHOOK    Staphylococcus aureus DETECTED (A) NOT DETECTED Final    Comment: Methicillin (oxacillin) susceptible Staphylococcus aureus (MSSA). Preferred therapy is anti staphylococcal beta lactam antibiotic (Cefazolin or Nafcillin), unless clinically contraindicated. CRITICAL RESULT CALLED TO, READ BACK BY AND VERIFIED WITH: V. JOHNSTON PHARMD, AT 0848 01/10/17 BY D. VANHOOK    Methicillin resistance NOT DETECTED NOT DETECTED Final   Streptococcus species NOT DETECTED NOT DETECTED Final   Streptococcus agalactiae NOT DETECTED NOT DETECTED Final   Streptococcus pneumoniae NOT DETECTED NOT DETECTED Final   Streptococcus pyogenes NOT DETECTED NOT DETECTED Final   Acinetobacter baumannii NOT DETECTED NOT DETECTED Final   Enterobacteriaceae species NOT DETECTED NOT DETECTED Final   Enterobacter cloacae complex NOT DETECTED NOT DETECTED Final   Escherichia coli NOT DETECTED NOT DETECTED Final   Klebsiella oxytoca NOT DETECTED NOT DETECTED Final   Klebsiella pneumoniae NOT DETECTED NOT DETECTED Final   Proteus species NOT DETECTED NOT DETECTED Final   Serratia marcescens NOT DETECTED NOT DETECTED Final   Haemophilus influenzae NOT DETECTED NOT DETECTED Final   Neisseria meningitidis NOT DETECTED NOT DETECTED Final   Pseudomonas aeruginosa NOT DETECTED NOT DETECTED Final   Candida albicans NOT DETECTED NOT DETECTED Final   Candida glabrata NOT DETECTED NOT DETECTED Final   Candida krusei NOT DETECTED NOT DETECTED Final   Candida parapsilosis NOT DETECTED NOT DETECTED Final   Candida tropicalis NOT DETECTED NOT DETECTED Final  Culture, blood (routine  x 2)     Status: None (Preliminary result)   Collection Time: 01/11/17  2:58 PM  Result Value Ref Range Status   Specimen Description BLOOD RIGHT ANTECUBITAL  Final   Special Requests   Final    BOTTLES DRAWN AEROBIC ONLY Blood Culture adequate volume   Culture NO GROWTH 3 DAYS  Final   Report Status PENDING  Incomplete  Culture, blood (routine x 2)     Status: None (Preliminary result)   Collection Time: 01/11/17  3:02 PM  Result Value Ref Range Status   Specimen Description BLOOD RIGHT HAND  Final   Special Requests   Final    BOTTLES DRAWN AEROBIC ONLY Blood Culture adequate volume   Culture NO GROWTH 3 DAYS  Final   Report Status PENDING  Incomplete      Radiology Studies: Ir Gastrostomy Tube Mod Sed  Result Date: 01/13/2017 CLINICAL DATA:  Esophagitis. History of esophageal carcinoma. Dysphagia, malnutrition, needs enteral feeding support. EXAM: PERC PLACEMENT GASTROSTOMY FLUOROSCOPY TIME:  30 uGym2 DAP, 2 minutes 30 seconds TECHNIQUE: The procedure, risks, benefits, and alternatives were explained to the patient. Questions regarding the procedure were encouraged and answered. The patient understands and consents to the procedure. Patient was receiving adequate prophylactic antibiotic coverage.Progression of previously administered oral barium into the colon was confirmed fluoroscopically. A 5 French angiographic catheter was placed as orogastric tube. The upper abdomen was prepped with Betadine, draped in usual sterile fashion, and infiltrated locally with 1% lidocaine. Intravenous Fentanyl and Versed were administered as conscious sedation during continuous monitoring of the patient's level of  consciousness and physiological / cardiorespiratory status by the radiology RN, with a total moderate sedation time of 11 minutes. Stomach was insufflated using air through the orogastric tube. 0.5 mg glucagon given IV to facilitate gastric distention. An 72 French sheath needle was advanced  percutaneously into the gastric lumen under fluoroscopy. Gas could be aspirated and a small contrast injection confirmed intraluminal spread. The sheath was exchanged over a guidewire for a 9 [place] vascular sheath, through which the snare device was advanced and used to snare a guidewire passed through the orogastric tube. This was withdrawn, and the snare attached to the 20 French pull-through gastrostomy tube, which was advanced antegrade, positioned with the internal bumper securing the anterior gastric wall to the anterior abdominal wall. Small contrast injection confirms appropriate positioning. The external bumper was applied and the catheter was flushed. COMPLICATIONS: COMPLICATIONS none IMPRESSION: 1. Technically successful 20 French pull-through gastrostomy placement under fluoroscopy. Electronically Signed   By: Lucrezia Europe M.D.   On: 01/13/2017 10:50   Dg Chest Port 1 View  Result Date: 01/14/2017 CLINICAL DATA:  Cough and shortness of breath EXAM: PORTABLE CHEST 1 VIEW COMPARISON:  Jan 10, 2017 FINDINGS: There are pleural effusions bilaterally with bibasilar airspace consolidation. There is cardiomegaly with pulmonary venous hypertension. Port-A-Cath tip is in the superior vena cava near the cavoatrial junction. No pneumothorax. There is aortic atherosclerosis. No adenopathy. No bone lesions. IMPRESSION: Evidence of a degree of congestive heart failure. Suspect superimposed pneumonia in the lung bases. In comparison with the recent study, there is increased opacity in the right base. Changes on the left are stable. There is aortic atherosclerosis. Port-A-Cath position unchanged. No pneumothorax. Electronically Signed   By: Lowella Grip III M.D.   On: 01/14/2017 14:22   Scheduled Meds: . aspirin EC  81 mg Oral Daily  . atorvastatin  20 mg Oral Daily  . budesonide (PULMICORT) nebulizer solution  0.5 mg Nebulization BID  . carvedilol  6.25 mg Oral BID WC  . furosemide  40 mg Intravenous Q8H    . losartan  12.5 mg Oral Daily  . multivitamin with minerals  1 tablet Oral Daily  . sodium chloride flush  3 mL Intravenous Q12H  . zolpidem  5 mg Oral Once   Continuous Infusions: . sodium chloride 250 mL (01/14/17 0649)  .  ceFAZolin (ANCEF) IV 1 g (01/15/17 4401)  . dextrose 5 % and 0.9% NaCl 50 mL/hr at 01/14/17 0309  . feeding supplement (JEVITY 1.2 CAL) 1,000 mL (01/12/17 1454)  . nitroGLYCERIN 15 mcg/min (01/14/17 1429)     LOS: 13 days   Vance Gather, MD Pager: Text Page via [email or web address]  [phone]   If 7PM-7AM, please contact night-coverage [email or web address] Password [email or web address] 01/15/2017, 9:10 AM

## 2017-01-15 NOTE — Progress Notes (Signed)
Progress Note  Patient Name: Collin Young Date of Encounter: 01/15/2017  Primary Cardiologist: new Dr. Oval Linsey  Subjective   Feels better, no chest pain and no SOB.    Inpatient Medications    Scheduled Meds: . aspirin EC  81 mg Oral Daily  . atorvastatin  20 mg Oral Daily  . budesonide (PULMICORT) nebulizer solution  0.5 mg Nebulization BID  . carvedilol  6.25 mg Oral BID WC  . furosemide  40 mg Intravenous Q8H  . losartan  12.5 mg Oral Daily  . multivitamin with minerals  1 tablet Oral Daily  . sodium chloride flush  3 mL Intravenous Q12H  . zolpidem  5 mg Oral Once   Continuous Infusions: . sodium chloride 250 mL (01/14/17 0649)  .  ceFAZolin (ANCEF) IV Stopped (01/15/17 0705)  . dextrose 5 % and 0.9% NaCl 50 mL/hr at 01/14/17 0309  . feeding supplement (JEVITY 1.2 CAL) 1,000 mL (01/12/17 1454)  . nitroGLYCERIN 15 mcg/min (01/14/17 1429)   PRN Meds: sodium chloride, acetaminophen, guaiFENesin-dextromethorphan, HYDROcodone-acetaminophen, HYDROmorphone (DILAUDID) injection, ipratropium-albuterol, lip balm, MUSCLE RUB, ondansetron (ZOFRAN) IV, phenol, polyethylene glycol, polyvinyl alcohol, sodium chloride, sodium chloride flush, sodium chloride flush, white petrolatum   Vital Signs    Vitals:   01/14/17 2300 01/15/17 0552 01/15/17 0743 01/15/17 0831  BP: 132/81 130/82  (!) 119/94  Pulse: 93 88  92  Resp: (!) 21 (!) 26  (!) 25  Temp: 98.7 F (37.1 C) 97.2 F (36.2 C)    TempSrc: Oral Axillary    SpO2: 100% 99% 97% 96%  Weight:  118 lb 12.8 oz (53.9 kg)    Height:        Intake/Output Summary (Last 24 hours) at 01/15/17 1021 Last data filed at 01/15/17 0720  Gross per 24 hour  Intake               20 ml  Output             2300 ml  Net            -2280 ml   Filed Weights   01/13/17 0438 01/14/17 0301 01/15/17 0552  Weight: 124 lb 6.4 oz (56.4 kg) 121 lb 12.8 oz (55.2 kg) 118 lb 12.8 oz (53.9 kg)    Telemetry    ST yesterday and SR with PVCs -  Personally Reviewed  ECG    01/14/17 ST with no acute changes.   - Personally Reviewed  Physical Exam   GEN: No acute distress.   Neck: No JVD Cardiac: RRR, + murmurs,  No rubs, or gallops.  Respiratory: + breath sounds to auscultation bilaterally. Few rales bases GI: Soft, nontender, non-distended , having tube feeding now. Site stable. MS: No edema; No deformity. Neuro:  Nonfocal  Psych: Normal affect   Labs    Chemistry Recent Labs Lab 01/13/17 0456 01/14/17 0900 01/15/17 0458  NA 130* 133* 133*  K 4.7 4.6 4.0  CL 95* 99* 96*  CO2 26 24 26   GLUCOSE 113* 77 121*  BUN 20 20 26*  CREATININE 0.85 0.98 1.07  CALCIUM 8.2* 7.4* 8.2*  GFRNONAA >60 >60 >60  GFRAA >60 >60 >60  ANIONGAP 9 10 11      Hematology Recent Labs Lab 01/13/17 0456 01/14/17 0608 01/15/17 0458  WBC 14.6* 13.7* 13.8*  RBC 4.59 4.47 4.60  HGB 12.6* 12.5* 12.6*  HCT 38.8* 38.1* 38.9*  MCV 84.5 85.2 84.6  MCH 27.5 28.0 27.4  MCHC 32.5 32.8  32.4  RDW 14.1 14.4 14.3  PLT 234 283 316    Cardiac Enzymes Recent Labs Lab 01/14/17 1250 01/14/17 2232 01/15/17 0458  TROPONINI 0.05* 0.09* 0.09*   No results for input(s): TROPIPOC in the last 168 hours.   BNP Recent Labs Lab 01/14/17 0608  BNP >4,500.0*     DDimer No results for input(s): DDIMER in the last 168 hours.   Radiology    Ir Gastrostomy Tube Mod Sed  Result Date: 01/13/2017 CLINICAL DATA:  Esophagitis. History of esophageal carcinoma. Dysphagia, malnutrition, needs enteral feeding support. EXAM: PERC PLACEMENT GASTROSTOMY FLUOROSCOPY TIME:  52 uGym2 DAP, 2 minutes 30 seconds TECHNIQUE: The procedure, risks, benefits, and alternatives were explained to the patient. Questions regarding the procedure were encouraged and answered. The patient understands and consents to the procedure. Patient was receiving adequate prophylactic antibiotic coverage.Progression of previously administered oral barium into the colon was confirmed  fluoroscopically. A 5 French angiographic catheter was placed as orogastric tube. The upper abdomen was prepped with Betadine, draped in usual sterile fashion, and infiltrated locally with 1% lidocaine. Intravenous Fentanyl and Versed were administered as conscious sedation during continuous monitoring of the patient's level of consciousness and physiological / cardiorespiratory status by the radiology RN, with a total moderate sedation time of 11 minutes. Stomach was insufflated using air through the orogastric tube. 0.5 mg glucagon given IV to facilitate gastric distention. An 37 French sheath needle was advanced percutaneously into the gastric lumen under fluoroscopy. Gas could be aspirated and a small contrast injection confirmed intraluminal spread. The sheath was exchanged over a guidewire for a 9 [place] vascular sheath, through which the snare device was advanced and used to snare a guidewire passed through the orogastric tube. This was withdrawn, and the snare attached to the 20 French pull-through gastrostomy tube, which was advanced antegrade, positioned with the internal bumper securing the anterior gastric wall to the anterior abdominal wall. Small contrast injection confirms appropriate positioning. The external bumper was applied and the catheter was flushed. COMPLICATIONS: COMPLICATIONS none IMPRESSION: 1. Technically successful 20 French pull-through gastrostomy placement under fluoroscopy. Electronically Signed   By: Lucrezia Europe M.D.   On: 01/13/2017 10:50   Dg Chest Port 1 View  Result Date: 01/14/2017 CLINICAL DATA:  Cough and shortness of breath EXAM: PORTABLE CHEST 1 VIEW COMPARISON:  Jan 10, 2017 FINDINGS: There are pleural effusions bilaterally with bibasilar airspace consolidation. There is cardiomegaly with pulmonary venous hypertension. Port-A-Cath tip is in the superior vena cava near the cavoatrial junction. No pneumothorax. There is aortic atherosclerosis. No adenopathy. No bone  lesions. IMPRESSION: Evidence of a degree of congestive heart failure. Suspect superimposed pneumonia in the lung bases. In comparison with the recent study, there is increased opacity in the right base. Changes on the left are stable. There is aortic atherosclerosis. Port-A-Cath position unchanged. No pneumothorax. Electronically Signed   By: Lowella Grip III M.D.   On: 01/14/2017 14:22    Cardiac Studies   Echo 01/03/17: Study Conclusions  - Left ventricle: The cavity size was normal. Wall thickness was normal. The estimated ejection fraction was in the range of 10% to 15%. Diffuse hypokinesis. Diastolic function is abnormal, indeterminant grade. - Aortic valve: There was moderate regurgitation. Valve area (VTI): 1.08 cm^2. Valve area (Vmax): 1.5 cm^2. Valve area (Vmean): 1.5 cm^2. - Mitral valve: There was moderate regurgitation. - Left atrium: The atrium was mildly dilated. - Right ventricle: Systolic function was mildly to moderately reduced. - Tricuspid valve: There was  moderate regurgitation. - Pulmonary arteries: Systolic pressure was moderately increased. PA peak pressure: 49 mm Hg (S).  LHC/RHC 01/05/17: Prox RCA to Mid RCA lesion, 99 %stenosed.  Mid RCA lesion, 30 %stenosed.  Dist RCA lesion, 40 %stenosed.  RPDA lesion, 20 %stenosed.  Prox Cx lesion, 100 %stenosed.  Ost Ramus to Ramus lesion, 50 %stenosed.  Ost 3rd Diag to 3rd Diag lesion, 30 %stenosed.  Mid LAD lesion, 40 %stenosed.  Ost LAD to Prox LAD lesion, 20 %stenosed.  Ost LM to LM lesion, 40 %stenosed.  Hemodynamic findings consistent with mild pulmonary hypertension.  1. Severe calcific disease in the proximal to mid RCA 2. 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 Moderate left main and mid LAD stenosis 4. Elevated filling pressures  Patient Profile     66 y.o. male 66 y.o. 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 Found to have severe 3 vessel CAD on cath. Hospitalization complicated by aspiration pneumonia and MSSA bacteremia.  Pt's case discussed with interventional team.   "His RCA intervention would be technically challenging and would likely require hemodynamic support given his low LVEF. Given his frailty and lack of ischemia symptoms, we will pursue medical management unless he develops chest pain. We will try to wean milrinone tonight. Hopefully his BP will tolerate titration of medical therapy, starting tomorrow. Plan for close outpatient follow up at discharge and we will consider PCI once more stable and his bacteremia has cleared."  Assessment & Plan    Acute systolic and diastolic HF- now off milrinone, NTG is back at 15 mcg.  after flash pul edema yesterday now on lasix 40 mg IV every 8 hours.  Improved output.  On coreg 6.25 BID  Losartan started yesterday. . Pt feels much better. --Now + 1108 improved from 2783 + yesterday  --Wt though is down from yesterday to 118.12 from 121 lbs. Co-ox 58 yesterday --labile BP 130/82 to 119/94 --begin imdur and wean NTG slowly - will give 30 mg now.  - continue IV lasix today then to po in AM will leave to Dr. Oval Linsey  - check co-ox in AM   ICM with EF 10-15% see above  Multivessel CAD - he is too frail for CABG--see above but treat medically and once bacteremia has cleared consider PCI.   --troponin 0.05 to 0.09 flat trend  -no acute EKG changes with episode yesterday.   MSSA bacteremia unable to do TEE,  Given his prior surgery (esophageal cancer) and XRT with aspiration PNA and failed swallow study.  Manage empirically    Pulmonary HTN see above.  Signed, Cecilie Kicks, NP  01/15/2017, 10:21 AM

## 2017-01-15 NOTE — Procedures (Signed)
R IJ port catheter removal No complication No blood loss. See complete dictation in [facility].

## 2017-01-15 NOTE — Sedation Documentation (Signed)
Tolerated removal well, ready for tx back to floor.  Continues on 2L/[place].  Dressing right chest CDI, no c/o pain.

## 2017-01-15 NOTE — Progress Notes (Signed)
[place] pt for [facility] this hospital admission.  [facility] will provide home [facility]fusion Pharm[facility] services for home IV ABX at DC.  [facility] will provide other [facility] services as ordered.  If patient discharges after hours, please call [phone].   Larry Sierras 01/15/2017, 2:20 PM

## 2017-01-15 NOTE — Care Management Note (Addendum)
Case Management Note  Patient Details  Name: Collin Young MRN: [id number] Date of Birth: 1951/08/04  Subjective/Objective: Pt presented for CHF-now off Milrinone. Post Cath 01-05-17 revealed multivessel CAD. Staged PCI on 01-13-17. Positive for MSSA Bacteremia and Aspiration Pneumonia. Continues on IV Ancef. Peg tube placed and plan to de-access port 01-15-17. Plan for PICC line post with home IV antibiotic therapy for 4 weeks. Pt is from home alone, however daughter [name] will be with patient once d/c.   Action/Plan: [facility] is following the patient for IV antibiotic therapy. Pt will need HHRN as well. CM to offer choice for [facility].   Expected Discharge Date:                  Expected Discharge Plan:  [place]  In-House Referral:  NA  Discharge planning Services  CM Consult  Post Acute Care Choice:  Home Health, Durable Medical Equipment Choice offered to:  Patient  DME Arranged:  IV pump/equipment DME Agency:  [facility] Arranged:  RN, IV Antibiotics HH Agency:  [place]  Status of Service:  In process, will continue to follow  If discussed at Long Length of Stay Meetings, dates discussed:  01-13-17, 01-15-17  Additional Comments: 1558 01-12-17 Jacqlyn Krauss, RN,BSN [phone]  CM did offer choice to patient and daughter and both agreeable to [place] with [facility]. Referral made to [facility] with [facility]. SOC to begin within 24-48 hours of d/c. CM will continue to monitor.  Bethena Roys, RN 01/15/2017, 2:52 PM

## 2017-01-16 DIAGNOSIS — I25708 Atherosclerosis of coronary artery bypass graft(s), unspecified, with other forms of angina pectoris: Secondary | ICD-10-CM

## 2017-01-16 LAB — CULTURE, BLOOD (ROUTINE X 2)
Culture: NO GROWTH
Culture: NO GROWTH
SPECIAL REQUESTS: ADEQUATE
Special Requests: ADEQUATE

## 2017-01-16 LAB — GLUCOSE, CAPILLARY
GLUCOSE-CAPILLARY: 149 mg/dL — AB (ref 65–99)
GLUCOSE-CAPILLARY: 128 mg/dL — AB (ref 65–99)
Glucose-Capillary: 126 mg/dL — ABNORMAL HIGH (ref 65–99)
Glucose-Capillary: 94 mg/dL (ref 65–99)
Glucose-Capillary: 149 mg/dL — ABNORMAL HIGH (ref 65–99)
Glucose-Capillary: 140 mg/dL — ABNORMAL HIGH (ref 65–99)

## 2017-01-16 LAB — BASIC METABOLIC PANEL
Anion gap: 9 (ref 5–15)
BUN: 32 mg/dL — AB (ref 6–20)
CHLORIDE: 96 mmol/L — AB (ref 101–111)
CO2: 26 mmol/L (ref 22–32)
CREATININE: 1.04 mg/dL (ref 0.61–1.24)
Calcium: 8.3 mg/dL — ABNORMAL LOW (ref 8.9–10.3)
GFR calc Af Amer: >60 mL/min (ref >60)
GFR calc non Af Amer: >60 mL/min (ref >60)
Glucose, Bld: 103 mg/dL — ABNORMAL HIGH (ref 65–99)
Potassium: 3.7 mmol/L (ref 3.5–5.1)
Sodium: 131 mmol/L — ABNORMAL LOW (ref 135–145)

## 2017-01-16 MED ORDER — FUROSEMIDE 40 MG PO TABS
40.0000 mg | ORAL_TABLET | Freq: Every day | ORAL | Status: DC
  Administered 2017-01-16 – 2017-01-17 (×2): 40 mg
  Filled 2017-01-16: qty 1

## 2017-01-16 MED ORDER — ASPIRIN 81 MG PO CHEW
81.0000 mg | CHEWABLE_TABLET | Freq: Every day | ORAL | Status: DC
  Administered 2017-01-16 – 2017-01-17 (×2): 81 mg
  Filled 2017-01-16 (×2): qty 1

## 2017-01-16 MED ORDER — LOSARTAN POTASSIUM 25 MG PO TABS
25.0000 mg | ORAL_TABLET | Freq: Every day | ORAL | Status: DC
  Administered 2017-01-16 – 2017-01-17 (×2): 25 mg
  Filled 2017-01-16: qty 1

## 2017-01-16 MED ORDER — ISOSORBIDE MONONITRATE 10 MG PO TABS
15.0000 mg | ORAL_TABLET | Freq: Two times a day (BID) | ORAL | Status: DC
  Administered 2017-01-16: 15 mg
  Filled 2017-01-16 (×2): qty 1.5

## 2017-01-16 MED ORDER — HYDROCODONE-ACETAMINOPHEN 5-325 MG PO TABS: 1.0000 | ORAL_TABLET | ORAL | Status: DC | PRN

## 2017-01-16 MED ORDER — SPIRONOLACTONE 25 MG PO TABS
25.0000 mg | ORAL_TABLET | Freq: Every day | ORAL | Status: DC
  Administered 2017-01-16 – 2017-01-17 (×2): 25 mg
  Filled 2017-01-16: qty 1

## 2017-01-16 MED ORDER — ADULT MULTIVITAMIN W/MINERALS CH
1.0000 | ORAL_TABLET | Freq: Every day | ORAL | Status: DC
  Administered 2017-01-16 – 2017-01-17 (×2): 1
  Filled 2017-01-16: qty 1

## 2017-01-16 MED ORDER — LOSARTAN POTASSIUM 25 MG PO TABS
25.0000 mg | ORAL_TABLET | Freq: Every day | ORAL | Status: DC
  Filled 2017-01-16: qty 1

## 2017-01-16 MED ORDER — ACETAMINOPHEN 325 MG PO TABS
650.0000 mg | ORAL_TABLET | ORAL | Status: DC | PRN
  Administered 2017-01-16: 650 mg
  Filled 2017-01-16: qty 2

## 2017-01-16 MED ORDER — ISOSORBIDE MONONITRATE 10 MG PO TABS
15.0000 mg | ORAL_TABLET | Freq: Two times a day (BID) | ORAL | Status: DC
  Filled 2017-01-16: qty 1.5

## 2017-01-16 MED ORDER — CARVEDILOL 6.25 MG PO TABS
6.2500 mg | ORAL_TABLET | Freq: Two times a day (BID) | ORAL | Status: DC
  Administered 2017-01-16 – 2017-01-17 (×3): 6.25 mg
  Filled 2017-01-16 (×2): qty 1

## 2017-01-16 MED ORDER — ISOSORBIDE DINITRATE 10 MG PO TABS: 15.0000 mg | ORAL_TABLET | Freq: Two times a day (BID) | ORAL | Status: DC

## 2017-01-16 MED ORDER — SODIUM CHLORIDE 0.9% FLUSH
10.0000 mL | Freq: Two times a day (BID) | INTRAVENOUS | Status: DC
  Administered 2017-01-16 – 2017-01-17 (×2): 10 mL

## 2017-01-16 MED ORDER — ATORVASTATIN CALCIUM 20 MG PO TABS
20.0000 mg | ORAL_TABLET | Freq: Every day | ORAL | Status: DC
  Administered 2017-01-16 – 2017-01-17 (×2): 20 mg
  Filled 2017-01-16: qty 1

## 2017-01-16 MED ORDER — SODIUM CHLORIDE 0.9% FLUSH: 10.0000 mL | INTRAVENOUS | Status: DC | PRN

## 2017-01-16 MED ORDER — POLYETHYLENE GLYCOL 3350 17 G PO PACK: 17.0000 g | PACK | Freq: Every day | ORAL | Status: DC | PRN

## 2017-01-16 MED ORDER — ISOSORBIDE MONONITRATE 10 MG PO TABS
10.0000 mg | ORAL_TABLET | Freq: Two times a day (BID) | ORAL | Status: DC
  Administered 2017-01-16 – 2017-01-17 (×2): 10 mg
  Filled 2017-01-16 (×2): qty 1

## 2017-01-16 MED ORDER — GUAIFENESIN-DM 100-10 MG/5ML PO SYRP: 5.0000 mL | ORAL_SOLUTION | ORAL | Status: DC | PRN

## 2017-01-16 MED ORDER — ASPIRIN 81 MG PO CHEW: 81.0000 mg | CHEWABLE_TABLET | Freq: Every day | ORAL | Status: DC

## 2017-01-16 NOTE — Progress Notes (Signed)
Peripherally Inserted Central Catheter/Midline Placement  The IV Nurse has discussed with the patient and/or persons authorized to consent for the patient, the purpose of this procedure and the potential benefits and risks involved with this procedure.  The benefits include less needle sticks, lab draws from the catheter, and the patient may be discharged home with the catheter. Risks include, but not limited to, infection, bleeding, blood clot (thrombus formation), and puncture of an artery; nerve damage and irregular heartbeat and possibility to perform a PICC exchange if needed/ordered by physician.  Alternatives to this procedure were also discussed.  [employer] PICC patient education guide, fact sheet on infection prevention and patient information card has been provided to patient /or left at bedside.    PICC/Midline Placement Documentation  PICC Single Lumen 01/16/17 PICC Right Brachial 37 cm 0 cm (Active)  Indication for Insertion or Continuance of Line Home intravenous therapies (PICC only) 01/16/2017  4:38 PM  Exposed Catheter (cm) 0 cm 01/16/2017  4:38 PM  Site Assessment Clean;Dry;Intact 01/16/2017  4:38 PM  Line Status Flushed;Saline locked;Blood return noted 01/16/2017  4:38 PM  Dressing Type Transparent 01/16/2017  4:38 PM  Dressing Status Clean;Dry;Intact;Antimicrobial disc in place 01/16/2017  4:38 PM  Line Care Connections checked and tightened 01/16/2017  4:38 PM  Line Adjustment (NICU/IV Team Only) No 01/16/2017  4:38 PM  Dressing Intervention New dressing 01/16/2017  4:38 PM  Dressing Change Due 01/23/17 01/16/2017  4:38 PM       Rolena Infante 01/16/2017, 4:39 PM

## 2017-01-16 NOTE — Progress Notes (Signed)
Spoke with RN regarding PICC placement planned for this afternoon, 24 hours after PAC removal as per order.

## 2017-01-16 NOTE — Progress Notes (Signed)
Progress Note  Patient Name: Collin Young Date of Encounter: 01/16/2017  Primary Cardiologist: [place] broadly today, feels better, able to lie flat.  Inpatient Medications    Scheduled Meds: . aspirin EC  81 mg Oral Daily  . atorvastatin  20 mg Oral Daily  . budesonide (PULMICORT) nebulizer solution  0.5 mg Nebulization BID  . carvedilol  6.25 mg Oral BID WC  . furosemide  40 mg Oral Daily  . isosorbide mononitrate  30 mg Oral Daily  . losartan  12.5 mg Oral Daily  . multivitamin with minerals  1 tablet Oral Daily  . sodium chloride flush  3 mL Intravenous Q12H  . spironolactone  25 mg Oral Daily  . zolpidem  5 mg Oral Once   Continuous Infusions: .  ceFAZolin (ANCEF) IV Stopped (01/16/17 0938)  . feeding supplement (JEVITY 1.2 CAL) 1,000 mL (01/16/17 0618)  . nitroGLYCERIN Stopped (01/15/17 1700)   PRN Meds: acetaminophen, guaiFENesin-dextromethorphan, HYDROcodone-acetaminophen, HYDROmorphone (DILAUDID) injection, ipratropium-albuterol, lip balm, MUSCLE RUB, ondansetron (ZOFRAN) IV, phenol, polyethylene glycol, polyvinyl alcohol, sodium chloride, sodium chloride flush, sodium chloride flush, white petrolatum   Vital Signs    Vitals:   01/16/17 0017 01/16/17 0419 01/16/17 0438 01/16/17 0737  BP: 120/65  129/79 115/63  Pulse: 79  82 83  Resp: (!) 21  15 20   Temp: 98.7 F (37.1 C)  97.6 F (36.4 C) 98.9 F (37.2 C)  TempSrc: Oral  Oral Oral  SpO2: 99%  100% 97%  Weight:  98 lb 14.4 oz (44.9 kg)    Height:        Intake/Output Summary (Last 24 hours) at 01/16/17 0745 Last data filed at 01/16/17 0740  Gross per 24 hour  Intake               60 ml  Output              900 ml  Net             -840 ml   Filed Weights   01/14/17 0301 01/15/17 0552 01/16/17 0419  Weight: 121 lb 12.8 oz (55.2 kg) 118 lb 12.8 oz (53.9 kg) 98 lb 14.4 oz (44.9 kg)    Telemetry    NSR, 80s, occ PVCs - Personally Reviewed  ECG    Sinus Tachy, LAD -  Personally Reviewed  Physical Exam  Looks comfortable.Also looks very frail GEN: No acute distress.   Neck: No JVD Cardiac: RRR, no murmurs, rubs, or gallops.  Respiratory:  rhonchi in right lung, no wet rales, diminished breath sounds bilaterally  GI: Soft, nontender, non-distended gastrostomy tube.  MS: No edema; No deformity. Neuro:  Nonfocal  Psych: Normal affect   Labs    Chemistry Recent Labs Lab 01/14/17 0900 01/15/17 0458 01/16/17 0259  NA 133* 133* 131*  K 4.6 4.0 3.7  CL 99* 96* 96*  CO2 24 26 26   GLUCOSE 77 121* 103*  BUN 20 26* 32*  CREATININE 0.98 1.07 1.04  CALCIUM 7.4* 8.2* 8.3*  GFRNONAA >60 >60 >60  GFRAA >60 >60 >60  ANIONGAP 10 11 9      Hematology Recent Labs Lab 01/13/17 0456 01/14/17 0608 01/15/17 0458  WBC 14.6* 13.7* 13.8*  RBC 4.59 4.47 4.60  HGB 12.6* 12.5* 12.6*  HCT 38.8* 38.1* 38.9*  MCV 84.5 85.2 84.6  MCH 27.5 28.0 27.4  MCHC 32.5 32.8 32.4  RDW 14.1 14.4 14.3  PLT 234 283 316  Cardiac Enzymes Recent Labs Lab 01/14/17 1250 01/14/17 2232 01/15/17 0458  TROPONINI 0.05* 0.09* 0.09*   No results for input(s): TROPIPOC in the last 168 hours.   BNP Recent Labs Lab 01/14/17 0608  BNP >4,500.0*     DDimer No results for input(s): DDIMER in the last 168 hours.   Radiology    Ir Removal [employer] W/ [place] W/o [place] Mod Sed  Result Date: 01/15/2017 CLINICAL DATA:  Port catheter that I placed 09/28/2013, has worked well without complication. Positive blood cultures and removal is requested now. EXAM: EXAM TUNNELED PORT CATHETER REMOVAL TECHNIQUE: The procedure, risks (including but not limited to bleeding, infection, organ damage ), benefits, and alternatives were explained to the patient. Questions regarding the procedure were encouraged and answered. The patient understands and consents to the procedure. Intravenous Fentanyl and Versed were administered as conscious sedation during continuous monitoring of the patient's level  of consciousness and physiological / cardiorespiratory status by the radiology RN, with a total moderate sedation time of 15 minutes. Overlying skin prepped with chlorhexidine, draped in usual sterile fashion, infiltrated locally with 1% lidocaine. A small incision was made over the scar from previous placement. The port catheter was dissected free from the underlying soft tissues and removed intact. Hemostasis was achieved. The port pocket was closed with deep interrupted and subcuticular continuous 3-0 Monocryl sutures, then covered with Dermabond. The patient tolerated the procedure well. COMPLICATIONS: COMPLICATIONS None immediate IMPRESSION: 1.  Technically successful tunneled Port catheter removal. Electronically Signed   By: Lucrezia Europe M.D.   On: 01/15/2017 16:35   Dg Chest Port 1 View  Result Date: 01/14/2017 CLINICAL DATA:  Cough and shortness of breath EXAM: PORTABLE CHEST 1 VIEW COMPARISON:  Jan 10, 2017 FINDINGS: There are pleural effusions bilaterally with bibasilar airspace consolidation. There is cardiomegaly with pulmonary venous hypertension. Port-A-Cath tip is in the superior vena cava near the cavoatrial junction. No pneumothorax. There is aortic atherosclerosis. No adenopathy. No bone lesions. IMPRESSION: Evidence of a degree of congestive heart failure. Suspect superimposed pneumonia in the lung bases. In comparison with the recent study, there is increased opacity in the right base. Changes on the left are stable. There is aortic atherosclerosis. Port-A-Cath position unchanged. No pneumothorax. Electronically Signed   By: Lowella Grip III M.D.   On: 01/14/2017 14:22    Cardiac Studies  CATH 01/05/17 Diagnostic Diagram         Most Recent Value  Fick Cardiac Output 2.1 L/min  Fick Cardiac Output Index 1.31 (L/min)/BSA  RA A Wave 6 mmHg  RA V Wave 5 mmHg  RA Mean 3 mmHg  RV Systolic Pressure 50 mmHg  RV Diastolic Pressure -4 mmHg  RV EDP 8 mmHg  PA Systolic Pressure 46  mmHg  PA Diastolic Pressure 17 mmHg  PA Mean 29 mmHg  PW A Wave 26 mmHg  PW V Wave 33 mmHg  PW Mean 24 mmHg  AO Systolic Pressure 409 mmHg  AO Diastolic Pressure 63 mmHg  AO Mean 87 mmHg  LV Systolic Pressure 811 mmHg  LV Diastolic Pressure 3 mmHg  LV EDP 15 mmHg  Arterial Occlusion Pressure Extended Systolic Pressure 914 mmHg  Arterial Occlusion Pressure Extended Diastolic Pressure 64 mmHg  Arterial Occlusion Pressure Extended Mean Pressure 86 mmHg  Left Ventricular Apex Extended Systolic Pressure 782 mmHg  Left Ventricular Apex Extended Diastolic Pressure 3 mmHg  Left Ventricular Apex Extended EDP Pressure 18 mmHg  QP/QS 1  TPVR Index 22.14 HRUI  TSVR Index 66.42 HRUI  PVR SVR Ratio 0.06  TPVR/TSVR Ratio 0.33     Patient Profile     66 y.o. male with severe CMP (EF 10%) and new onset CHF with acute pulmonary edema, 2-vessel CAD with occluded LCX and long calcified RCA stenosis, pneumonia, MSSA bacteremia, head and neck cancer requiring feeding tube. Currently on medical therapy for heart failure, with potential for attempted high risk revascularization of the right coronary artery if clinical status improves.  Assessment & Plan    1. CHF: Appears close to euvolemic and tolerating heart failure meds, blood pressure allows further additional titration of ARB. All his medications have to be crushed and administered via feeding tube, somewhat limiting the selection of available medications. Switch to oral diuretics today. If he maintains volume status during the next 24 hours, maybe close to discharge. 2. CAD: Does not have angina pectoris. Consider high risk PCI/rotablation-stent of RCA in future. 3. MSSA bacteremia: No clinical signs of endocarditis, echo does not suggest severe valvular disease. Unable to perform TEE due to esophageal cancer/surgery. 4. PAH: Pulmonary gradient is low. Pulmonary hypertension is a triple to left heart failure.  Signed, Sanda Klein, MD    01/16/2017, 7:45 AM

## 2017-01-16 NOTE — Progress Notes (Signed)
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 He presented to the emergency department complaining of progressive shortness of breath for the past several weeks. He was admitted with concerns of CHF, with BNP significantly elevated at 2000. Echocardiogram revealed ejection fraction of 10-15% patient was started on beta blockers Lasix and Aldactone. Patient also on admission was found to be on acute kidney injury. Cardiology consulted, underwent cardiac cath showing significant multivessel disease. Recommended staged PCI when creatinine improves. Dysphagia was evaluated with recommendation per SLP for total NPO status, NG tube and reevaluation once clinical status improved. Blood cultures have been positive for MSSA and ID is making recommendations. Palliative care has been consulted and after family meeting, patient is DNR. PEG tube placed 5/16. Repeat blood cultures from 5/12 following initiation of effective abx grew MSSA again in ~48 hrs. Repeat cultures were drawn 5/14 and remain NGTD. Port was removed due to concern for seeding 5/18. LHC thought to be prohibitively high risk, so plan is to wean milrinone, NTG as able. Experienced flash pulmonary edema with initial weaning attempting of NTG 5/17, though has been stable since then with titrating off of infusions. Plan to insert PICC and continue IV antibiotics 5/20 for 28 days.   Subjective: Wide smile, feeling better than yesterday. Wants to go home.   Assessment & Plan: Acute respiratory failure with hypoxia: Due to ischemic cardiomyopathy/acute systolic CHF, pulmonary edema, and likely aspiration pneumonitis/pneumonia.  - May require O2 at  discharge - Continue nebs per pulmonology - Continue antibiotics as below - Repeat CXR in AM  Acute systolic heart failure: New diagnosis, due to multivessel CAD.  - NTG gtt for pulmonary edema weaned 5/18.  - Restarted lasix > switching to oral 40mg  daily 5/19.  - Tolerating ARB (was on hold due to AKI). Monitor Cr and output.   - Daily weight, I&O's low salt diet. - Milrinone weaned. Not sure home inotrope is an option.  Ischemic cardiomyopathy: Left main and LAD with moderate disease and chronically occluded LCx in addition to 99% stenosis of RCA thought to be amenable to atherectomy/staged PCI pending renal function.  - ASA, statin, plavix. - LHC w/RCA intervention would be technically challenging and high risk, given his lack of angina, plan per cardiology is to consider PCI once more stable at follow up.  Severe aspiration: Related to h/o radiation to esophagus, MBSS 5/11 demonstrated frank aspiration of all consistencies. Cortrak was placed as bridge to PEG. - PEG placed 5/16.  - Nutrition consulted.   MSSA bacteremia: in 1 of 2 blood cultures from 5/10 and again on 5/12. PCT improving 10 > 2. 5/14 Blood cultures NGTD - ID following, on ancef. pulled port 5/18 - Place PICC, which will start day 1 of 28 of IV abx.   AKI: Suspect cardiorenal syndrome vs from contrast vs diuretics. Poor CO leading to poor kidney perfusion. FENa 0.64% suggestive of prerenal etiology. - Monitor daily, resolved  Complex left renal cyst: Warrants additional surveillance with MRI or CT with contrast will defer after patient stable given patient will receive multiple contrast doses for cardiac cath. This can be follow as outpatient as well.   Enlarged prostate: Seen on U/S. No LUTS/BOO. PSA normal.  -  No medications given lack of symptoms.   Hypertension: Per patient was taken off his BP meds by PCP PTA  - Continue coreg and NTG gtt as tolerated   Squamous cell CA of the left piriform sinus and  esophagus: In remission  - Continuing home fentanyl patch.   Hyponatremia: Suspect hypervolemia, improving, mild. - Monitor  DVT prophylaxis: Lovenox  Code Status: Full Family Communication: None this AM Disposition Plan: Transfer to telemetry. Possible DC to home in next 24 - 48 hours.  Consultants:   Cardiology   PCCM  ID   Palliative care  IR  Procedures:   ECHO 01/03/17 - Left ventricle: The cavity size was normal. Wall thickness was   normal. The estimated ejection fraction was in the range of 10%   to 15%. Diffuse hypokinesis. DIastolic function is abnormal,   indeterminant grade. - Aortic valve: There was moderate regurgitation. Valve area (VTI):   1.08 cm^2. Valve area (Vmax): 1.5 cm^2. Valve area (Vmean): 1.5   cm^2. - Mitral valve: There was moderate regurgitation. - Left atrium: The atrium was mildly dilated. - Right ventricle: Systolic function was mildly to moderately   reduced. - Tricuspid valve: There was moderate regurgitation. - Pulmonary arteries: Systolic pressure was moderately increased.   PA peak pressure: 49 mm Hg (S).  Right/Left Heart Cath and Coronary Angiography 01/05/2017     Prox RCA to Mid RCA lesion, 99 %stenosed.  Mid RCA lesion, 30 %stenosed.  Dist RCA lesion, 40 %stenosed.  RPDA lesion, 20 %stenosed.  Prox Cx lesion, 100 %stenosed.  Ost Ramus to Ramus lesion, 50 %stenosed.  Ost 3rd Diag to 3rd Diag lesion, 30 %stenosed.  Mid LAD lesion, 40 %stenosed.  Ost LAD to Prox LAD lesion, 20 %stenosed.  Ost LM to LM lesion, 40 %stenosed.  Hemodynamic findings consistent with mild pulmonary hypertension.  1. Severe calcific disease in the proximal to mid RCA 2. 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 Moderate left main and mid LAD stenosis 4. Elevated filling pressures  Recommendations: He has multivessel CAD. I think the RCA can be approached with PCI but will likely require atherectomy prior to stenting. I  would treat the remainder of his CAD medically. Will stage PCI given renal insufficiency. Hydrate gently tonight. May need diuresis tomorrow given elevated filling pressures. Would anticipate PCI on Thursday or Friday of this week if renal function remains stable.    PEG Placement 01/13/2017  Antimicrobials: Vancomycin 5/10 > 5/12 Cefepime 5/12 > 5/14  Ancef 5/14 >>   Objective: Vitals:   01/16/17 0438 01/16/17 0737 01/16/17 0844 01/16/17 0922  BP: 129/79 115/63    Pulse: 82 83  85  Resp: 15 20    Temp: 97.6 F (36.4 C) 98.9 F (37.2 C)    TempSrc: Oral Oral    SpO2: 100% 97% 98%   Weight:      Height:        Intake/Output Summary (Last 24 hours) at 01/16/17 1029 Last data filed at 01/16/17 0900  Gross per 24 hour  Intake               60 ml  Output              900 ml  Net             -840 ml   Filed Weights   01/14/17 0301 01/15/17 0552 01/16/17 0419  Weight: 55.2 kg (121 lb 12.8 oz) 53.9 kg (118 lb 12.8 oz) 44.9  kg (98 lb 14.4 oz)    Examination: General exam: Thin male in no distress appearing older than stated age. High spirits.  Respiratory system: No accessory muscle use, 2L by [place]. Clear bilaterally Cardiovascular system: Regular, +S4 noted without murmur. No LE edema. Gastrointestinal system: Abd Soft NTND, +BS. PEG in situ LUQ nontender, no discharge. Extremities: No LE edema, no deformity Skin: No lesions or rash. Right upper chest port removed, bandage c/d/i without erythema  Data Reviewed: I have personally reviewed following labs and imaging studies  CBC:  Recent Labs Lab 01/11/17 0523 01/12/17 0446 01/13/17 0456 01/14/17 0608 01/15/17 0458  WBC 12.6* 13.6* 14.6* 13.7* 13.8*  HGB 10.9* 11.7* 12.6* 12.5* 12.6*  HCT 33.3* 35.7* 38.8* 38.1* 38.9*  MCV 86.5 86.4 84.5 85.2 84.6  PLT 172 193 234 283 818   Basic Metabolic Panel:  Recent Labs Lab 01/12/17 0446 01/13/17 0456 01/14/17 0900 01/15/17 0458 01/16/17 0259  NA 130* 130* 133* 133* 131*   K 3.9 4.7 4.6 4.0 3.7  CL 95* 95* 99* 96* 96*  CO2 28 26 24 26 26   GLUCOSE 153* 113* 77 121* 103*  BUN 20 20 20  26* 32*  CREATININE 0.83 0.85 0.98 1.07 1.04  CALCIUM 7.8* 8.2* 7.4* 8.2* 8.3*   GFR: Estimated Creatinine Clearance: 44.4 mL/min (by C-G formula based on SCr of 1.04 mg/dL). Liver Function Tests: No results for input(s): AST, ALT, ALKPHOS, BILITOT, PROT, ALBUMIN in the last 168 hours. Recent Results (from the past 240 hour(s))  Culture, blood (Routine X 2) w Reflex to ID Panel     Status: None   Collection Time: 01/07/17  3:43 PM  Result Value Ref Range Status   Specimen Description BLOOD RIGHT ANTECUBITAL  Final   Special Requests IN PEDIATRIC BOTTLE Blood Culture adequate volume  Final   Culture NO GROWTH 5 DAYS  Final   Report Status 01/12/2017 FINAL  Final  Culture, blood (Routine X 2) w Reflex to ID Panel     Status: Abnormal   Collection Time: 01/07/17  3:47 PM  Result Value Ref Range Status   Specimen Description BLOOD RIGHT HAND  Final   Special Requests IN PEDIATRIC BOTTLE Blood Culture adequate volume  Final   Culture  Setup Time   Final    GRAM POSITIVE COCCI IN CLUSTERS IN PEDIATRIC BOTTLE CRITICAL RESULT CALLED TO, READ BACK BY AND VERIFIED WITH: Leonie Green Pharm.D. 13:30 01/08/17 ([email or web address])    Culture STAPHYLOCOCCUS AUREUS (A)  Final   Report Status 01/10/2017 FINAL  Final   Organism ID, Bacteria STAPHYLOCOCCUS AUREUS  Final      Susceptibility   Staphylococcus aureus - MIC*    CIPROFLOXACIN <=0.5 SENSITIVE Sensitive     ERYTHROMYCIN <=0.25 SENSITIVE Sensitive     GENTAMICIN <=0.5 SENSITIVE Sensitive     OXACILLIN 0.5 SENSITIVE Sensitive     TETRACYCLINE <=1 SENSITIVE Sensitive     VANCOMYCIN <=0.5 SENSITIVE Sensitive     TRIMETH/SULFA <=10 SENSITIVE Sensitive     CLINDAMYCIN <=0.25 SENSITIVE Sensitive     RIFAMPIN <=0.5 SENSITIVE Sensitive     Inducible Clindamycin NEGATIVE Sensitive     * STAPHYLOCOCCUS AUREUS  Blood Culture ID Panel (Reflexed)      Status: Abnormal   Collection Time: 01/07/17  3:47 PM  Result Value Ref Range Status   Enterococcus species NOT DETECTED NOT DETECTED Final   Listeria monocytogenes NOT DETECTED NOT DETECTED Final   Staphylococcus species DETECTED (A) NOT DETECTED Final    Comment:  CRITICAL RESULT CALLED TO, READ BACK BY AND VERIFIED WITH: M. Radford Pax Pharm.D. 13:30 01/08/17 ([email or web address])    Staphylococcus aureus DETECTED (A) NOT DETECTED Final    Comment: Methicillin (oxacillin) susceptible Staphylococcus aureus (MSSA). Preferred therapy is anti staphylococcal beta lactam antibiotic (Cefazolin or Nafcillin), unless clinically contraindicated. CRITICAL RESULT CALLED TO, READ BACK BY AND VERIFIED WITH: M. Radford Pax Pharm.D. 13:30 01/08/17 ([email or web address])    Methicillin resistance NOT DETECTED NOT DETECTED Final   Streptococcus species NOT DETECTED NOT DETECTED Final   Streptococcus agalactiae NOT DETECTED NOT DETECTED Final   Streptococcus pneumoniae NOT DETECTED NOT DETECTED Final   Streptococcus pyogenes NOT DETECTED NOT DETECTED Final   Acinetobacter baumannii NOT DETECTED NOT DETECTED Final   Enterobacteriaceae species NOT DETECTED NOT DETECTED Final   Enterobacter cloacae complex NOT DETECTED NOT DETECTED Final   Escherichia coli NOT DETECTED NOT DETECTED Final   Klebsiella oxytoca NOT DETECTED NOT DETECTED Final   Klebsiella pneumoniae NOT DETECTED NOT DETECTED Final   Proteus species NOT DETECTED NOT DETECTED Final   Serratia marcescens NOT DETECTED NOT DETECTED Final   Haemophilus influenzae NOT DETECTED NOT DETECTED Final   Neisseria meningitidis NOT DETECTED NOT DETECTED Final   Pseudomonas aeruginosa NOT DETECTED NOT DETECTED Final   Candida albicans NOT DETECTED NOT DETECTED Final   Candida glabrata NOT DETECTED NOT DETECTED Final   Candida krusei NOT DETECTED NOT DETECTED Final   Candida parapsilosis NOT DETECTED NOT DETECTED Final   Candida tropicalis NOT DETECTED NOT DETECTED Final  MRSA PCR  Screening     Status: None   Collection Time: 01/07/17  5:21 PM  Result Value Ref Range Status   MRSA by PCR NEGATIVE NEGATIVE Final    Comment:        The GeneXpert MRSA Assay (FDA approved for NASAL specimens only), is one component of a comprehensive MRSA colonization surveillance program. It is not intended to diagnose MRSA infection nor to guide or monitor treatment for MRSA infections.   Culture, blood (Routine X 2) w Reflex to ID Panel     Status: Abnormal   Collection Time: 01/09/17 10:15 AM  Result Value Ref Range Status   Specimen Description BLOOD RIGHT HAND  Final   Special Requests   Final    Blood Culture results may not be optimal due to an inadequate volume of blood received in culture bottles   Culture  Setup Time   Final    GRAM POSITIVE COCCI IN CLUSTERS Organism ID to follow IN PEDIATRIC BOTTLE CRITICAL RESULT CALLED TO, READ BACK BY AND VERIFIED WITH: N. JOHNSTON PHARMD, AT 0848 01/10/17 BY D. VANHOOK    Culture STAPHYLOCOCCUS AUREUS (A)  Final   Report Status 01/12/2017 FINAL  Final   Organism ID, Bacteria STAPHYLOCOCCUS AUREUS  Final      Susceptibility   Staphylococcus aureus - MIC*    CIPROFLOXACIN <=0.5 SENSITIVE Sensitive     ERYTHROMYCIN <=0.25 SENSITIVE Sensitive     GENTAMICIN <=0.5 SENSITIVE Sensitive     OXACILLIN <=0.25 SENSITIVE Sensitive     TETRACYCLINE <=1 SENSITIVE Sensitive     VANCOMYCIN <=0.5 SENSITIVE Sensitive     TRIMETH/SULFA <=10 SENSITIVE Sensitive     CLINDAMYCIN <=0.25 SENSITIVE Sensitive     RIFAMPIN <=0.5 SENSITIVE Sensitive     Inducible Clindamycin NEGATIVE Sensitive     * STAPHYLOCOCCUS AUREUS  Blood Culture ID Panel (Reflexed)     Status: Abnormal   Collection Time: 01/09/17 10:15 AM  Result Value Ref Range Status  Enterococcus species NOT DETECTED NOT DETECTED Final   Listeria monocytogenes NOT DETECTED NOT DETECTED Final   Staphylococcus species DETECTED (A) NOT DETECTED Final    Comment: CRITICAL RESULT CALLED  TO, READ BACK BY AND VERIFIED WITH: V. JOHNSTON PHARMD, AT 0848 01/10/17 BY D. VANHOOK    Staphylococcus aureus DETECTED (A) NOT DETECTED Final    Comment: Methicillin (oxacillin) susceptible Staphylococcus aureus (MSSA). Preferred therapy is anti staphylococcal beta lactam antibiotic (Cefazolin or Nafcillin), unless clinically contraindicated. CRITICAL RESULT CALLED TO, READ BACK BY AND VERIFIED WITH: V. JOHNSTON PHARMD, AT 0848 01/10/17 BY D. VANHOOK    Methicillin resistance NOT DETECTED NOT DETECTED Final   Streptococcus species NOT DETECTED NOT DETECTED Final   Streptococcus agalactiae NOT DETECTED NOT DETECTED Final   Streptococcus pneumoniae NOT DETECTED NOT DETECTED Final   Streptococcus pyogenes NOT DETECTED NOT DETECTED Final   Acinetobacter baumannii NOT DETECTED NOT DETECTED Final   Enterobacteriaceae species NOT DETECTED NOT DETECTED Final   Enterobacter cloacae complex NOT DETECTED NOT DETECTED Final   Escherichia coli NOT DETECTED NOT DETECTED Final   Klebsiella oxytoca NOT DETECTED NOT DETECTED Final   Klebsiella pneumoniae NOT DETECTED NOT DETECTED Final   Proteus species NOT DETECTED NOT DETECTED Final   Serratia marcescens NOT DETECTED NOT DETECTED Final   Haemophilus influenzae NOT DETECTED NOT DETECTED Final   Neisseria meningitidis NOT DETECTED NOT DETECTED Final   Pseudomonas aeruginosa NOT DETECTED NOT DETECTED Final   Candida albicans NOT DETECTED NOT DETECTED Final   Candida glabrata NOT DETECTED NOT DETECTED Final   Candida krusei NOT DETECTED NOT DETECTED Final   Candida parapsilosis NOT DETECTED NOT DETECTED Final   Candida tropicalis NOT DETECTED NOT DETECTED Final  Culture, blood (routine x 2)     Status: None (Preliminary result)   Collection Time: 01/11/17  2:58 PM  Result Value Ref Range Status   Specimen Description BLOOD RIGHT ANTECUBITAL  Final   Special Requests   Final    BOTTLES DRAWN AEROBIC ONLY Blood Culture adequate volume   Culture NO  GROWTH 4 DAYS  Final   Report Status PENDING  Incomplete  Culture, blood (routine x 2)     Status: None (Preliminary result)   Collection Time: 01/11/17  3:02 PM  Result Value Ref Range Status   Specimen Description BLOOD RIGHT HAND  Final   Special Requests   Final    BOTTLES DRAWN AEROBIC ONLY Blood Culture adequate volume   Culture NO GROWTH 4 DAYS  Final   Report Status PENDING  Incomplete      Radiology Studies: Ir Removal Tun Access W/ [place] W/o [place] Mod Sed  Result Date: 01/15/2017 CLINICAL DATA:  Port catheter that I placed 09/28/2013, has worked well without complication. Positive blood cultures and removal is requested now. EXAM: EXAM TUNNELED PORT CATHETER REMOVAL TECHNIQUE: The procedure, risks (including but not limited to bleeding, infection, organ damage ), benefits, and alternatives were explained to the patient. Questions regarding the procedure were encouraged and answered. The patient understands and consents to the procedure. Intravenous Fentanyl and Versed were administered as conscious sedation during continuous monitoring of the patient's level of consciousness and physiological / cardiorespiratory status by the radiology RN, with a total moderate sedation time of 15 minutes. Overlying skin prepped with chlorhexidine, draped in usual sterile fashion, infiltrated locally with 1% lidocaine. A small incision was made over the scar from previous placement. The port catheter was dissected free from the underlying soft tissues and removed intact. Hemostasis  was achieved. The port pocket was closed with deep interrupted and subcuticular continuous 3-0 Monocryl sutures, then covered with Dermabond. The patient tolerated the procedure well. COMPLICATIONS: COMPLICATIONS None immediate IMPRESSION: 1.  Technically successful tunneled Port catheter removal. Electronically Signed   By: Lucrezia Europe M.D.   On: 01/15/2017 16:35   Dg Chest Port 1 View  Result Date: 01/14/2017 CLINICAL DATA:   Cough and shortness of breath EXAM: PORTABLE CHEST 1 VIEW COMPARISON:  Jan 10, 2017 FINDINGS: There are pleural effusions bilaterally with bibasilar airspace consolidation. There is cardiomegaly with pulmonary venous hypertension. Port-A-Cath tip is in the superior vena cava near the cavoatrial junction. No pneumothorax. There is aortic atherosclerosis. No adenopathy. No bone lesions. IMPRESSION: Evidence of a degree of congestive heart failure. Suspect superimposed pneumonia in the lung bases. In comparison with the recent study, there is increased opacity in the right base. Changes on the left are stable. There is aortic atherosclerosis. Port-A-Cath position unchanged. No pneumothorax. Electronically Signed   By: Lowella Grip III M.D.   On: 01/14/2017 14:22   Scheduled Meds: . aspirin  81 mg Per Tube Daily  . atorvastatin  20 mg Per Tube Daily  . budesonide (PULMICORT) nebulizer solution  0.5 mg Nebulization BID  . carvedilol  6.25 mg Per Tube BID WC  . furosemide  40 mg Per Tube Daily  . isosorbide mononitrate  15 mg Per Tube BID  . losartan  25 mg Per Tube Daily  . multivitamin with minerals  1 tablet Per Tube Daily  . sodium chloride flush  3 mL Intravenous Q12H  . spironolactone  25 mg Per Tube Daily   Continuous Infusions: .  ceFAZolin (ANCEF) IV Stopped (01/16/17 8182)  . feeding supplement (JEVITY 1.2 CAL) 1,000 mL (01/16/17 0618)  . nitroGLYCERIN Stopped (01/15/17 1700)     LOS: 14 days   Vance Gather, MD Pager: Text Page via [email or web address]  [phone]   If 7PM-7AM, please contact night-coverage [email or web address] Password [facility] 01/16/2017, 10:29 AM

## 2017-01-17 ENCOUNTER — Inpatient Hospital Stay (HOSPITAL_COMMUNITY): Payer: Medicare Other

## 2017-01-17 DIAGNOSIS — K208 Other esophagitis: Secondary | ICD-10-CM

## 2017-01-17 LAB — COOXEMETRY PANEL
Carboxyhemoglobin: 0.9 % (ref 0.5–1.5)
Methemoglobin: 0.8 % (ref 0.0–1.5)
O2 Saturation: 51.8 %
Total hemoglobin: 12.7 g/dL (ref 12.0–16.0)

## 2017-01-17 LAB — GLUCOSE, CAPILLARY
GLUCOSE-CAPILLARY: 121 mg/dL — AB (ref 65–99)
GLUCOSE-CAPILLARY: 139 mg/dL — AB (ref 65–99)
GLUCOSE-CAPILLARY: 142 mg/dL — AB (ref 65–99)
GLUCOSE-CAPILLARY: 153 mg/dL — AB (ref 65–99)

## 2017-01-17 LAB — BASIC METABOLIC PANEL
ANION GAP: 6 (ref 5–15)
BUN: 28 mg/dL — ABNORMAL HIGH (ref 6–20)
CHLORIDE: 93 mmol/L — AB (ref 101–111)
CO2: 29 mmol/L (ref 22–32)
Calcium: 8.1 mg/dL — ABNORMAL LOW (ref 8.9–10.3)
Creatinine, Ser: 0.89 mg/dL (ref 0.61–1.24)
GFR calc non Af Amer: >60 mL/min (ref >60)
Glucose, Bld: 157 mg/dL — ABNORMAL HIGH (ref 65–99)
Potassium: 3.8 mmol/L (ref 3.5–5.1)
SODIUM: 128 mmol/L — AB (ref 135–145)

## 2017-01-17 MED ORDER — CARVEDILOL 6.25 MG PO TABS: 6.2500 mg | ORAL_TABLET | Freq: Two times a day (BID) | ORAL | 0 refills | Status: DC

## 2017-01-17 MED ORDER — JEVITY 1.2 CAL PO LIQD: 1000.0000 mL | ORAL | 30 refills | Status: DC

## 2017-01-17 MED ORDER — BUDESONIDE 0.5 MG/2ML IN SUSP: 0.5000 mg | Freq: Two times a day (BID) | RESPIRATORY_TRACT | 0 refills | Status: DC

## 2017-01-17 MED ORDER — IPRATROPIUM-ALBUTEROL 0.5-2.5 (3) MG/3ML IN SOLN: 3.0000 mL | RESPIRATORY_TRACT | 0 refills | Status: DC | PRN

## 2017-01-17 MED ORDER — CEFAZOLIN IV (FOR PTA / DISCHARGE USE ONLY): 1.0000 g | Freq: Three times a day (TID) | INTRAVENOUS | 0 refills | Status: AC

## 2017-01-17 MED ORDER — HEPARIN SOD (PORK) LOCK FLUSH 100 UNIT/ML IV SOLN
250.0000 [IU] | Freq: Once | INTRAVENOUS | Status: AC
  Administered 2017-01-17: 250 [IU] via INTRAVENOUS
  Filled 2017-01-17 (×3): qty 2.5

## 2017-01-17 MED ORDER — FUROSEMIDE 40 MG PO TABS: 40.0000 mg | ORAL_TABLET | Freq: Every day | ORAL | 0 refills | Status: DC

## 2017-01-17 MED ORDER — SPIRONOLACTONE 25 MG PO TABS: 25.0000 mg | ORAL_TABLET | Freq: Every day | ORAL | 0 refills | Status: DC

## 2017-01-17 MED ORDER — LOSARTAN POTASSIUM 25 MG PO TABS: 25.0000 mg | ORAL_TABLET | Freq: Every day | ORAL | 0 refills | Status: DC

## 2017-01-17 MED ORDER — ASPIRIN 81 MG PO CHEW: 81.0000 mg | CHEWABLE_TABLET | Freq: Every day | ORAL | 0 refills | Status: DC

## 2017-01-17 MED ORDER — ISOSORBIDE MONONITRATE 10 MG PO TABS: ORAL_TABLET | ORAL | 0 refills | Status: DC

## 2017-01-17 NOTE — Discharge Instructions (Signed)
Gastrostomy Tube Home Guide, Adult A gastrostomy tube is a tube that is surgically placed into the stomach. It is also called a G-tube. G-tubes are used when a person is unable to eat and drink enough on their own to stay healthy. The tube is inserted into the stomach through a small cut (incision) in the skin. This tube is used for:  Feeding.  Giving medication. Gastrostomy tube care  Wash your hands with soap and water.  Remove the old dressing (if any). Some styles of G-tubes may need a dressing inserted between the skin and the G-tube. Other types of G-tubes do not require a dressing. Ask your health care provider if a dressing is needed.  Check the area where the tube enters the skin (insertion site) for redness, swelling, or pus-like (purulent) drainage. A small amount of clear or tan liquid drainage is normal. Check to make sure scar tissue (skin) is not growing around the insertion site. This could have a raised, bumpy appearance.  A cotton swab can be used to clean the skin around the tube:  When the G-tube is first put in, a normal saline solution or water can be used to clean the skin.  Mild soap and warm water can be used when the skin around the G-tube site has healed.  Roll the cotton swab around the G-tube insertion site to remove any drainage or crusting at the insertion site. Stomach residuals Feeding tube residuals are the amount of liquids that are in the stomach at any given time. Residuals may be checked before giving feedings, medications, or as instructed by your health care provider.  Ask your health care provider if there are instances when you would not start tube feedings depending on the amount or type of contents withdrawn from the stomach.  Check residuals by attaching a syringe to the G-tube and pulling back on the syringe plunger. Note the amount, and return the residual back into the stomach. Flushing the G-tube  The G-tube should be periodically  flushed with clean warm water to keep it from clogging.  Flush the G-tube after feedings or medications. Draw up 30 mL of warm water in a syringe. Connect the syringe to the G-tube and slowly push the water into the tube.  Do not push feedings, medications, or flushes rapidly. Flush the G-tube gently and slowly.  Only use syringes made for G-tubes to flush medications or feedings.  Your health care provider may want the G-tube flushed more often or with more water. If this is the case, follow your health care provider's instructions. Feedings Your health care provider will determine whether feedings are given as a bolus (a certain amount given at one time and at scheduled times) or whether feedings will be given continuously on a feeding pump.  Formulas should be given at room temperature.  If feedings are continuous, no more than 4 hours worth of feedings should be placed in the feeding bag. This helps prevent spoilage or accidental excess infusion.  Cover and place unused formula in the refrigerator.  If feedings are continuous, stop the feedings when medications or flushes are given. Be sure to restart the feedings.  Feeding bags and syringes should be replaced as instructed by your health care provider. Giving medication  In general, it is best if all medications are in a liquid form for G-tube administration. Liquid medications are less likely to clog the G-tube.  Mix the liquid medication with 30 mL (or amount recommended by your health  care provider) of warm water.  Draw up the medication into the syringe.  Attach the syringe to the G-tube and slowly push the mixture into the G-tube.  After giving the medication, draw up 30 mL of warm water in the syringe and slowly flush the G-tube.  For pills or capsules, check with your health care provider first before crushing medications. Some pills are not effective if they are crushed. Some capsules are sustained-release  medications.  If appropriate, crush the pill or capsule and mix with 30 mL of warm water. Using the syringe, slowly push the medication through the tube, then flush the tube with another 30 mL of tap water. G-tube problems G-tube was pulled out.  Cause: May have been pulled out accidentally.  Solutions: Cover the opening with clean dressing and tape. Call your health care provider right away. The G-tube should be put in as soon as possible (within 4 hours) so the G-tube opening (tract) does not close. The G-tube needs to be put in at a health care setting. An X-ray needs to be done to confirm placement before the G-tube can be used again. Redness, irritation, soreness, or foul odor around the gastrostomy site.  Cause: May be caused by leakage or infection.  Solutions: Call your health care provider right away. Large amount of leakage of fluid or mucus-like liquid present (a large amount means it soaks clothing).  Cause: Many reasons could cause the G-tube to leak.  Solutions: Call your health care provider to discuss the amount of leakage. Skin or scar tissue appears to be growing where tube enters skin.  Cause: Tissue growth may develop around the insertion site if the G-tube is moved or pulled on excessively.  Solutions: Secure tube with tape so that excess movement does not occur. Call your health care provider. G-tube is clogged.  Cause: Thick formula or medication.  Solutions: Try to slowly push warm water into the tube with a large syringe. Never try to push any object into the tube to unclog it. Do not force fluid into the G-tube. If you are unable to unclog the tube, call your health care provider right away. Tips  Head of bed (HOB) position refers to the upright position of a person's upper body.  When giving medications or a feeding bolus, keep the [facility] up as told by your health care provider. Do this during the feeding and for 1 hour after the feeding or medication  administration.  If continuous feedings are being given, it is best to keep the [facility] up as told by your health care provider. When ADLs (activities of daily living) are performed and the [facility] needs to be flat, be sure to turn the feeding pump off. Restart the feeding pump when the [facility] is returned to the recommended height.  Do not pull or put tension on the tube.  To prevent fluid backflow, kink the G-tube before removing the cap or disconnecting a syringe.  Check the G-tube length every day. Measure from the insertion site to the end of the G-tube. If the length is longer than previous measurements, the tube may be coming out. Call your health care provider if you notice increasing G-tube length.  Oral care, such as brushing teeth, must be continued.  You may need to remove excess air (vent) from the G-tube. Your health care provider will tell you if this is needed.  Always call your health care provider if you have questions or problems with the G-tube. Get help  right away if:  You have severe abdominal pain, tenderness, or abdominal bloating (distension).  You have nausea or vomiting.  You are constipated or have problems moving your bowels.  The G-tube insertion site is red, swollen, has a foul smell, or has yellow or brown drainage.  You have difficulty breathing or shortness of breath.  You have a fever.  You have a large amount of feeding tube residuals.  The G-tube is clogged and cannot be flushed. This information is not intended to replace advice given to you by your health care provider. Make sure you discuss any questions you have with your health care provider. Document Released: 10/26/2001 Document Revised: 01/23/2016 Document Reviewed: 04/24/2013 Elsevier Interactive Patient Education  2017 [place].   Heart Failure Heart failure is a condition in which the heart has trouble pumping blood because it has become weak or stiff. This means that the heart does not  pump blood efficiently for the body to work well. For some people with heart failure, fluid may back up into the lungs and there may be swelling (edema) in the lower legs. Heart failure is usually a long-term (chronic) condition. It is important for you to take good care of yourself and follow the treatment plan from your health care provider. What are the causes? This condition is caused by some health problems, including:  High blood pressure (hypertension). Hypertension causes the heart muscle to work harder than normal. High blood pressure eventually causes the heart to become stiff and weak.  Coronary artery disease (CAD). CAD is the buildup of cholesterol and fat (plaques) in the arteries of the heart.  Heart attack (myocardial infarction). Injured tissue, which is caused by the heart attack, does not contract as well and the heart's ability to pump blood is weakened.  Abnormal heart valves. When the heart valves do not open and close properly, the heart muscle must pump harder to keep the blood flowing.  Heart muscle disease (cardiomyopathy or myocarditis). Heart muscle disease is damage to the heart muscle from a variety of causes, such as drug or alcohol abuse, infections, or unknown causes. These can increase the risk of heart failure.  Lung disease. When the lungs do not work properly, the heart must work harder. What increases the risk? Risk of heart failure increases as a person ages. This condition is also more likely to develop in people who:  Are overweight.  Are male.  Smoke or chew tobacco.  Abuse alcohol or illegal drugs.  Have taken medicines that can damage the heart, such as chemotherapy drugs.  Have diabetes.  High blood sugar (glucose) is associated with high fat (lipid) levels in the blood.  Diabetes can also damage tiny blood vessels that carry nutrients to the heart muscle.  Have abnormal heart rhythms.  Have thyroid problems.  Have low blood counts  (anemia). What are the signs or symptoms? Symptoms of this condition include:  Shortness of breath with activity, such as when climbing stairs.  Persistent cough.  Swelling of the feet, ankles, legs, or abdomen.  Unexplained weight gain.  Difficulty breathing when lying flat (orthopnea).  Waking from sleep because of the need to sit up and get more air.  Rapid heartbeat.  Fatigue and loss of energy.  Feeling light-headed, dizzy, or close to fainting.  Loss of appetite.  Nausea.  Increased urination during the night (nocturia).  Confusion. How is this diagnosed? This condition is diagnosed based on:  Medical history, symptoms, and a physical exam.  Diagnostic tests, which may include:  Echocardiogram.  Electrocardiogram (ECG).  Chest X-ray.  Blood tests.  Exercise stress test.  Radionuclide scans.  Cardiac catheterization and angiogram. How is this treated? Treatment for this condition is aimed at managing the symptoms of heart failure. Medicines, behavioral changes, or other treatments may be necessary to treat heart failure. Medicines  These may include:  Angiotensin-converting enzyme (ACE) inhibitors. This type of medicine blocks the effects of a blood protein called angiotensin-converting enzyme. ACE inhibitors relax (dilate) the blood vessels and help to lower blood pressure.  Angiotensin receptor blockers (ARBs). This type of medicine blocks the actions of a blood protein called angiotensin. ARBs dilate the blood vessels and help to lower blood pressure.  Water pills (diuretics). Diuretics cause the kidneys to remove salt and water from the blood. The extra fluid is removed through urination, leaving a lower volume of blood that the heart has to pump.  Beta blockers. These improve heart muscle strength and they prevent the heart from beating too quickly.  Digoxin. This increases the force of the heartbeat. Healthy behavior changes  These may  include:  Reaching and maintaining a healthy weight.  Stopping smoking or chewing tobacco.  Eating heart-healthy foods.  Limiting or avoiding alcohol.  Stopping use of street drugs (illegal drugs).  Physical activity. Other treatments  These may include:  Surgery to open blocked coronary arteries or repair damaged heart valves.  Placement of a biventricular pacemaker to improve heart muscle function (cardiac resynchronization therapy). This device paces both the right ventricle and left ventricle.  Placement of a device to treat serious abnormal heart rhythms (implantable cardioverter defibrillator, or ICD).  Placement of a device to improve the pumping ability of the heart (left ventricular assist device, or LVAD).  Heart transplant. This can cure heart failure, and it is considered for certain patients who do not improve with other therapies. Follow these instructions at home: Medicines   Take over-the-counter and prescription medicines only as told by your health care provider. Medicines are important in reducing the workload of your heart, slowing the progression of heart failure, and improving your symptoms.  Do not stop taking your medicine unless your health care provider told you to do that.  Do not skip any dose of medicine.  Refill your prescriptions before you run out of medicine. You need your medicines every day. Eating and drinking    Eat heart-healthy foods. Talk with a dietitian to make an eating plan that is right for you.  Choose foods that contain no trans fat and are low in saturated fat and cholesterol. Healthy choices include fresh or frozen fruits and vegetables, fish, lean meats, legumes, fat-free or low-fat dairy products, and whole-grain or high-fiber foods.  Limit salt (sodium) if directed by your health care provider. Sodium restriction may reduce symptoms of heart failure. Ask a dietitian to recommend heart-healthy seasonings.  Use healthy  cooking methods instead of frying. Healthy methods include roasting, grilling, broiling, baking, poaching, steaming, and stir-frying.  Limit your fluid intake if directed by your health care provider. Fluid restriction may reduce symptoms of heart failure. Lifestyle   Stop smoking or using chewing tobacco. Nicotine and tobacco can damage your heart and your blood vessels. Do not use nicotine gum or patches before talking to your health care provider.  Limit alcohol intake to no more than 1 drink per day for non-pregnant women and 2 drinks per day for men. One drink equals 12 oz of beer, 5 oz  of wine, or 1 oz of hard liquor.  Drinking more than that is harmful to your heart. Tell your health care provider if you drink alcohol several times a week.  Talk with your health care provider about whether any level of alcohol use is safe for you.  If your heart has already been damaged by alcohol or you have severe heart failure, drinking alcohol should be stopped completely.  Stop use of illegal drugs.  Lose weight if directed by your health care provider. Weight loss may reduce symptoms of heart failure.  Do moderate physical activity if directed by your health care provider. People who are elderly and people with severe heart failure should consult with a health care provider for physical activity recommendations. Monitor important information   Weigh yourself every day. Keeping track of your weight daily helps you to notice excess fluid sooner.  Weigh yourself every morning after you urinate and before you eat breakfast.  Wear the same amount of clothing each time you weigh yourself.  Record your daily weight. Provide your health care provider with your weight record.  Monitor and record your blood pressure as told by your health care provider.  Check your pulse as told by your health care provider. Dealing with extreme temperatures   If the weather is extremely hot:  Avoid vigorous  physical activity.  Use air conditioning or fans or seek a cooler location.  Avoid caffeine and alcohol.  Wear loose-fitting, lightweight, and light-colored clothing.  If the weather is extremely cold:  Avoid vigorous physical activity.  Layer your clothes.  Wear mittens or gloves, a hat, and a scarf when you go outside.  Avoid alcohol. General instructions   Manage other health conditions such as hypertension, diabetes, thyroid disease, or abnormal heart rhythms as told by your health care provider.  Learn to manage stress. If you need help to do this, ask your health care provider.  Plan rest periods when fatigued.  Get ongoing education and support as needed.  Participate in or seek rehabilitation as needed to maintain or improve independence and quality of life.  Stay up to date with immunizations. Keeping current on pneumococcal and influenza immunizations is especially important to prevent respiratory infections.  Keep all follow-up visits as told by your health care provider. This is important. Contact a health care provider if:  You have a rapid weight gain.  You have increasing shortness of breath that is unusual for you.  You are unable to participate in your usual physical activities.  You tire easily.  You cough more than normal, especially with physical activity.  You have any swelling or more swelling in areas such as your hands, feet, ankles, or abdomen.  You are unable to sleep because it is hard to breathe.  You feel like your heart is beating quickly (palpitations).  You become dizzy or light-headed when you stand up. Get help right away if:  You have difficulty breathing.  You notice or your family notices a change in your awareness, such as having trouble staying awake or having difficulty with concentration.  You have pain or discomfort in your chest.  You have an episode of fainting (syncope). This information is not intended to  replace advice given to you by your health care provider. Make sure you discuss any questions you have with your health care provider. Document Released: 08/17/2005 Document Revised: 04/21/2016 Document Reviewed: 03/11/2016 Elsevier Interactive Patient Education  2017 [employer].

## 2017-01-17 NOTE — Discharge Summary (Signed)
Physician Discharge Summary  CAMILLE THAU JHE:[id number] DOB: Jun 27, 1951 DOA: 01/02/2017  PCP: [name], MD  Admit date: 01/02/2017 Discharge date: 01/17/2017  Admitted From: Home Disposition: Home   Recommendations for Outpatient Follow-up:  1. Follow up with PCP in 1-2 weeks 2. Follow up with cardiology in 4 - 6 weeks to discuss further consideration of catheterization 3. Follow up with home health RN for 28 days of ancef through PICC. Follow up with ID in 4 weeks. 4. Home health SLP and palliative care to follow patient.  5. Please monitor CBC and BMP at follow up 6. Monitor volume status. 117lbs at discharge.   Home Health: RN, SLP, palliative care Equipment/Devices: 2L O2, PICC single lumen RUE (5/19) Discharge Condition: Stable CODE STATUS: DNR Diet recommendation: NPO, feeding through PEG tube.  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 He presented to the emergency department complaining of progressive shortness of breath for the past several weeks. He was admitted with concerns of CHF, with BNP significantly elevated at 2000. Echocardiogram revealed ejection fraction of 10-15% patient was started on beta blockers Lasix and Aldactone. Patient also on admission was found to be on acute kidney injury. Cardiology consulted, underwent cardiac cath showing significant multivessel disease. Recommended staged PCI when creatinine improves. Dysphagia was evaluated with recommendation per SLP for total NPO status, NG tube and reevaluation once clinical status improved. Blood cultures have been positive for MSSA and ID is making recommendations. Palliative care has been consulted and after family meeting, patient is DNR. PEG tube placed 5/16. Repeat blood cultures from 5/12 following initiation of  effective abx grew MSSA again in ~48 hrs. Repeat cultures were drawn 5/14 and remain NGTD. Port was removed due to concern for seeding 5/18, 24 hours later a PICC line was placed with plan to continue 4 weeks of ancef from day port was pulled per ID, Dr. Baxter Flattery. LHC thought to be prohibitively high risk, so plan is to wean milrinone, NTG as able. Experienced flash pulmonary edema with initial weaning attempting of NTG 5/17, though has been stable since then with titrating off of infusions. Appears euvolemic on 5/20. Cardiology has evaluated patient and the medication regimen as below is thought to be optimized. He is discharged in stable condition to home with home health as above. Will require early follow up with cardiology.   Discharge Diagnoses:  Principal Problem:   Staphylococcus aureus bacteremia Active Problems:   Malignant neoplasm of pyriform sinus (HCC)   Acute combined systolic and diastolic heart failure (HCC)   AKI (acute kidney injury) ([place])   Acute pulmonary edema (HCC)   Congestive dilated cardiomyopathy (HCC)   Acute congestive heart failure (HCC)   Ischemic cardiomyopathy   Acute respiratory failure with hypoxia (HCC)   Palliative care by specialist   Dysphagia   Aspiration pneumonia of both lower lobes due to gastric secretions ([place])   Encounter for feeding tube placement   Angina decubitus (HCC)   Coronary artery disease of bypass graft of native heart with stable angina pectoris (HCC)   SOB (shortness of breath)  Acute respiratory failure with hypoxia > possibly chronic respiratory failure with hypoxia: Due to ischemic cardiomyopathy/acute systolic CHF, pulmonary edema, COPD, and likely aspiration pneumonitis/pneumonia. Continues to require oxygen, though appears as medically optimized as possible. Will discharge on 2L by [place].  - Continue nebs per  pulmonology: nebulizer machine ordered. - Continue antibiotics as below  Acute systolic heart failure: New diagnosis, due  to multivessel CAD.  - NTG gtt for pulmonary edema weaned 5/18.  - Restarted lasix > switching to oral 79m daily 5/19, continue this.  - Tolerating ARB. Monitor BMP at follow up  - Daily weight, I&O's. 117lbs at discharge. - Milrinone weaned. Does not seem home inotrope is required.  Ischemic cardiomyopathy: Left main and LAD with moderate disease and chronically occluded LCx in addition to 99% stenosis of RCA thought to be amenable to atherectomy/staged PCI pending renal function.  - ASA, statin - LHC w/RCA intervention would be technically challenging and high risk, given his lack of angina, plan per cardiology is to consider PCI once more stable at follow up.  Severe aspiration: Related to h/o radiation to esophagus, MBSS 5/11 demonstrated frank aspiration of all consistencies. Cortrak was placed as bridge to PEG. - PEG placed 5/16.  - Nutrition consulted.   MSSA bacteremia: in 1 of 2 blood cultures from 5/10 and again on 5/12. PCT improving 10 > 2. 5/14 Blood cultures NGTD - ID following, on ancef. Pulled port 5/18 due to suspicion of seeding. - Placed PICC 5/19. Continue 4 weeks of antibiotics: Regimen: Cefazolin 1 gm every 8 hours End date: 02/13/17 - Follow up with ID in 4 weeks.  AKI: Resolved. Suspect cardiorenal syndrome vs from contrast vs diuretics. Poor CO leading to poor kidney perfusion. FENa 0.64% suggestive of prerenal etiology. - Monitor at follow up.   Complex left renal cyst: Warrants additional surveillance with MRI or CT with contrast will defer after patient stable given patient will receive multiple contrast doses for cardiac cath. This can be follow as outpatient as well.   Enlarged prostate: Seen on U/S. No LUTS/BOO. PSA normal.  - No medications given lack of symptoms.   Hypertension: Per patient was taken off his BP meds by PCP PTA  - Continue medications, BP tolerating  Squamous cell CA of the left piriform sinus and esophagus: In remission  -  Continuing home fentanyl patch.   Hyponatremia: Suspect hypervolemia. - Monitor, if worsening would suspect he is taking free water by mouth.  Discharge Instructions Discharge Instructions    (HEART FAILURE PATIENTS) Call MD:  Anytime you have any of the following symptoms: 1) 3 pound weight gain in 24 hours or 5 pounds in 1 week 2) shortness of breath, with or without a dry hacking cough 3) swelling in the hands, feet or stomach 4) if you have to sleep on extra pillows at night in order to breathe.    Complete by:  As directed    Call MD for:  difficulty breathing, headache or visual disturbances    Complete by:  As directed    Call MD for:  extreme fatigue    Complete by:  As directed    Call MD for:  persistant dizziness or light-headedness    Complete by:  As directed    Call MD for:  persistant nausea and vomiting    Complete by:  As directed    Call MD for:  severe uncontrolled pain    Complete by:  As directed    Call MD for:  temperature >100.4    Complete by:  As directed    DME Nebulizer machine    Complete by:  As directed    Patient needs a nebulizer to treat with the following condition:   Chronic respiratory failure ([place] COPD (chronic obstructive  pulmonary disease) [facility])     Discharge instructions    Complete by:  As directed    You were admitted for severe heart disease which caused heart failure. With treatments you have improved significantly and are stable for discharge, though you will need to follow up with cardiology in the next 1-2 weeks to see how you're doing, and take medications as below, many of which are new.   You also had a bloodstream infection that is being treated with an antibiotic, ancef, for the next 28 days. Home health RN will assist with this. You will need to follow up with the infectious disease doctor in 4 weeks, Dr. Johnnye Sima.   Because of aspiration (breathing food/fluid into lungs) a feeding tube was placed and you will need to continue  using this for food and medications. It is recommended that you take nothing by mouth. Speech therapy will follow you at home as well.   Palliative care will follow up at home after discharge.   Home infusion instructions [facility] May follow [place] Dosing Protocol; May administer Cathflo as needed to maintain patency of vascular access device.; Flushing of vascular access device: per [facility] Protocol: 0.9% NaCl pre/post medica...    Complete by:  As directed    Instructions:  May follow [place] Dosing Protocol   Instructions:  May administer Cathflo as needed to maintain patency of vascular access device.   Instructions:  Flushing of vascular access device: per [facility] Protocol: 0.9% NaCl pre/post medication administration and prn patency; Heparin 100 u/ml, 54m for implanted ports and Heparin 10u/ml, 55mfor all other central venous catheters.   Instructions:  May follow [facility] Anaphylaxis Protocol for First Dose Administration in the home: 0.9% NaCl at 25-50 ml/hr to maintain IV access for protocol meds. Epinephrine 0.3 ml IV/IM PRN and Benadryl 25-50 IV/IM PRN s/s of anaphylaxis.   Instructions:  [facility] Coordinator (RN) to assist per patient IV care needs in the home PRN.     Allergies as of 01/17/2017   No Known Allergies     Medication List    TAKE these medications   aspirin 81 MG chewable tablet Place 1 tablet (81 mg total) into feeding tube daily. Start taking on:  01/18/2017   atorvastatin 20 MG tablet Commonly known as:  LIPITOR Take 20 mg by mouth daily.   budesonide 0.5 MG/2ML nebulizer solution Commonly known as:  PULMICORT Take 2 mLs (0.5 mg total) by nebulization 2 (two) times daily.   carvedilol 6.25 MG tablet Commonly known as:  COREG Place 1 tablet (6.25 mg total) into feeding tube 2 (two) times daily with a meal.   ceFAZolin IVPB Commonly known as:  ANCEF Inject 1 g into the vein every 8 (eight) hours. Indication:  MSSA bacteremia  Last Day of Therapy:  6/16- 28 days from port a cath removal Labs - Once weekly:  CBC/D and BMP, Labs - Every other week:  ESR and CRP   feeding supplement (JEVITY 1.2 CAL) Liqd Place 1,000 mLs into feeding tube continuous. 65cc/hr   fentaNYL 12 MCG/HR Commonly known as:  DURAGESIC - dosed mcg/hr Place 1 patch every 3 days to the skin   furosemide 40 MG tablet Commonly known as:  LASIX Place 1 tablet (40 mg total) into feeding tube daily. Start taking on:  01/18/2017   ipratropium-albuterol 0.5-2.5 (3) MG/3ML Soln Commonly known as:  DUONEB Take 3 mLs by nebulization every 4 (four) hours as needed.  isosorbide mononitrate 10 MG tablet Commonly known as:  ISMO,MONOKET take 1 tab (10m) per tube BID   losartan 25 MG tablet Commonly known as:  COZAAR Place 1 tablet (25 mg total) into feeding tube daily. Start taking on:  01/18/2017   multivitamin with minerals Tabs tablet Take 1 tablet by mouth daily.   spironolactone 25 MG tablet Commonly known as:  ALDACTONE Place 1 tablet (25 mg total) into feeding tube daily. Start taking on:  01/18/2017            Home Infusion Instuctions        Start     Ordered   01/17/17 0000  Home infusion instructions [facility] May follow [place]osing Protocol; May administer Cathflo as needed to maintain patency of vascular access device.; Flushing of vascular access device: per [facility]: 0.9% NaCl pre/post medica...    Question Answer Comment  Instructions May follow [facility]osing Protocol   Instructions May administer Cathflo as needed to maintain patency of vascular access device.   Instructions Flushing of vascular access device: per [facility] IncProtocol: 0.9% NaCl pre/post medication administration and prn patency; Heparin 100 u/ml, 550mfor implanted ports and Heparin 10u/ml, 66m22mor all other central venous catheters.   Instructions May follow [facility] Anaphylaxis Protocol for First Dose Administration in the home: 0.9% NaCl  at 25-50 ml/hr to maintain IV access for protocol meds. Epinephrine 0.3 ml IV/IM PRN and Benadryl 25-50 IV/IM PRN s/s of anaphylaxis.   Instructions [facility] Infusion Coordinator (RN) to assist per patient IV care needs in the home PRN.      01/17/17 1201       Durable Medical Equipment        Start     Ordered   01/17/17 1037  For home use only DME oxygen  Once    Question Answer Comment  Mode or (Route) Nasal cannula   Liters per Minute 2   Frequency Continuous (stationary and portable oxygen unit needed)   Oxygen delivery system Gas      01/17/17 1037   01/17/17 0000  DME Nebulizer machine    Question Answer Comment  Patient needs a nebulizer to treat with the following condition Chronic respiratory failure ([place] Patient needs a nebulizer to treat with the following condition COPD (chronic obstructive pulmonary disease) ([place]    01/17/17 1201     [place] up.   Why:  IV Pump/Equipment Contact information: [address] [place] [phone]     Health, [facility] Follow up.   Why:  Registered Nurse.  Contact information: [phone]        RobLorene DyD Follow up.   Specialty:  Internal Medicine Contact information: [phone]        RanSkeet LatchD. Schedule an appointment as soon as possible for a visit in 1 week(s).   Specialty:  Cardiology Contact information: [phone]        EnnVolanda NapoleonD Follow up.   Specialty:  Oncology Contact information: [id number] [phone]     HatCampbell RichesD. Schedule an appointment as soon as possible for a visit in 4 week(s).  Specialty:  Infectious Diseases Contact information: [place] [address] [phone]          No Known Allergies  Consultations:  Cardiology   PCCM  ID   Palliative care  IR  Procedures/Studies: Ct Abdomen Wo Contrast  Result Date: 01/12/2017 CLINICAL DATA:  Dysphagia, esophagitis, malnutrition and assessment for possible percutaneous gastrostomy tube placement. EXAM: CT ABDOMEN WITHOUT CONTRAST TECHNIQUE: Multidetector CT imaging of the abdomen was performed following the standard protocol without IV contrast. COMPARISON:  None. FINDINGS: Lower chest: Dense consolidation of both lower lobes, left greater than right, with associated small bilateral pleural effusions. Hepatobiliary: Unenhanced appearance of the liver and gallbladder are unremarkable. Pancreas: Unremarkable unenhanced appearance. Spleen: Normal size.  Unremarkable appearance. Adrenals/Urinary Tract: No hydronephrosis. Stomach/Bowel: Feeding tube extends into the proximal stomach. The stomach is anteriorly positioned and superior to the transverse colon. Anatomy would not be prohibitive to percutaneous gastrostomy. There is no evidence of bowel obstruction or significant ileus. No free air identified. Vascular/Lymphatic: No enlarged lymph nodes. Calcified aorta without evidence of aneurysm. Other: Anasarca.  No focal abnormal fluid collections identified. Musculoskeletal: Unremarkable. IMPRESSION: 1. Anatomy is not prohibitive to percutaneous gastrostomy tube placement. 2. Consolidation of both lower lobes, left greater than right with associated small bilateral pleural effusions. Electronically Signed   By: Aletta Edouard M.D.   On: 01/12/2017 08:01   Dg Chest 2 View  Result Date: 01/17/2017 CLINICAL DATA:  Acute respiratory failure. EXAM: CHEST  2 VIEW COMPARISON:  One-view chest x-ray 01/14/2017 FINDINGS: The right IJ Port-A-Cath has been removed. A right-sided PICC line is now in place. The tip terminates at the right atrium. The heart enlarged.  The edema and bilateral effusions are again noted. Extensive lower lobe airspace disease is present, left greater than right. The lung apices are clear. IMPRESSION: 1. Improved aeration in lung volumes with persistent interstitial edema and bilateral pleural effusions suggesting congestive heart failure. 2. Asymmetric left lower lobe airspace consolidation. While this may represent atelectasis, the extent of disease on the left is concerning for left lower lobe pneumonia. 3. Aortic atherosclerosis. 4. Interval removal of left IJ Port-A-Cath and placement of right-sided PICC line. The tip is in the right atrium. Electronically Signed   By: San Morelle M.D.   On: 01/17/2017 07:42   Dg Chest 2 View  Result Date: 01/07/2017 CLINICAL DATA:  Street shortness of breath, follow-up from chest x-ray of Jan 02, 2017 at which time pulmonary edema or less likely pneumonia was suspected. EXAM: CHEST  2 VIEW COMPARISON:  PA and lateral chest x-ray of Jan 02, 2017 FINDINGS: The lungs are adequately inflated. There are increased confluent alveolar opacities in the right mid and lower lung and in the left mid and lower lung. The interstitial markings are mildly increased. The cardiac silhouette remains enlarged. There is no significant left pleural effusion. There is a small right pleural effusion. There is calcification in the wall of the aortic arch. The power port catheter tip projects over the junction of the middle and distal thirds of the SVC. IMPRESSION: Worsening of airspace opacities compatible with pneumonia or asymmetric pulmonary edema though I favor the former. Mild cardiomegaly without pulmonary vascular congestion. Thoracic aortic atherosclerosis. Electronically Signed   By: David  Martinique M.D.   On: 01/07/2017 14:06   Dg Chest 2 View  Result Date: 01/02/2017 CLINICAL DATA:  Dyspnea, onset yesterday and worsening. EXAM: CHEST  2 VIEW COMPARISON:  10/09/2015 FINDINGS: Right-sided port  extends into the low  SVC. There are pleural effusions bilaterally, new. There is mild interstitial fluid and vascular prominence. Central and basilar airspace opacities are present. IMPRESSION: Interstitial and alveolar edema. New small pleural effusions. Cannot exclude infectious infiltrates. Electronically Signed   By: Andreas Newport M.D.   On: 01/02/2017 06:39   Dg Abd 1 View  Result Date: 01/08/2017 CLINICAL DATA:  Feeding tube placement. EXAM: ABDOMEN - 1 VIEW COMPARISON:  12/03/2013 FINDINGS: Enteric feeding tube tip projects in the stomach. There is residual contrast within the stomach, also within the small bowel and colon. Interstitial hazy airspace opacities are noted in the visualized lower lungs, with evidence of small pleural effusions. IMPRESSION: 1. Enteric feeding tube tip projects in the mid stomach. Electronically Signed   By: Lajean Manes M.D.   On: 01/08/2017 15:47   Ir Gastrostomy Tube Mod Sed  Result Date: 01/13/2017 CLINICAL DATA:  Esophagitis. History of esophageal carcinoma. Dysphagia, malnutrition, needs enteral feeding support. EXAM: PERC PLACEMENT GASTROSTOMY FLUOROSCOPY TIME:  59 uGym2 DAP, 2 minutes 30 seconds TECHNIQUE: The procedure, risks, benefits, and alternatives were explained to the patient. Questions regarding the procedure were encouraged and answered. The patient understands and consents to the procedure. Patient was receiving adequate prophylactic antibiotic coverage.Progression of previously administered oral barium into the colon was confirmed fluoroscopically. A 5 French angiographic catheter was placed as orogastric tube. The upper abdomen was prepped with Betadine, draped in usual sterile fashion, and infiltrated locally with 1% lidocaine. Intravenous Fentanyl and Versed were administered as conscious sedation during continuous monitoring of the patient's level of consciousness and physiological / cardiorespiratory status by the radiology RN, with a total moderate sedation time  of 11 minutes. Stomach was insufflated using air through the orogastric tube. 0.5 mg glucagon given IV to facilitate gastric distention. An 65 French sheath needle was advanced percutaneously into the gastric lumen under fluoroscopy. Gas could be aspirated and a small contrast injection confirmed intraluminal spread. The sheath was exchanged over a guidewire for a 9 [place] vascular sheath, through which the snare device was advanced and used to snare a guidewire passed through the orogastric tube. This was withdrawn, and the snare attached to the 20 French pull-through gastrostomy tube, which was advanced antegrade, positioned with the internal bumper securing the anterior gastric wall to the anterior abdominal wall. Small contrast injection confirms appropriate positioning. The external bumper was applied and the catheter was flushed. COMPLICATIONS: COMPLICATIONS none IMPRESSION: 1. Technically successful 20 French pull-through gastrostomy placement under fluoroscopy. Electronically Signed   By: Lucrezia Europe M.D.   On: 01/13/2017 10:50   US Renal  Result Date: 01/04/2017 CLINICAL DATA:  Acute renal insufficiency.  Hypertension. EXAM: RENAL / URINARY TRACT ULTRASOUND COMPLETE COMPARISON:  None. FINDINGS: Right Kidney: Length: 10.1 cm. Echogenicity is increased. Renal cortical thickness is normal. No mass, perinephric fluid, or hydronephrosis visualized. No sonographically demonstrable calculus or ureterectasis. Left Kidney: Length: 10.8 cm. Echogenicity is slightly increased. Renal cortical thickness is normal. No perinephric fluid or hydronephrosis visualized. There is a predominantly cystic mass arising from the mid left kidney measuring 3.6 x 3.0 x 3.0 cm. There is a focal area of increased echogenicity along the periphery of this cystic lesion. There is an extrarenal pelvis on the left. No sonographically demonstrable calculus or ureterectasis. Bladder: Appears normal for degree of bladder distention. Prostate  measures 5 x 3 cm. Prostate is mildly inhomogeneous in appearance. There is a right pleural effusion. IMPRESSION: Each kidney is echogenic, a finding  that may be seen with medical renal disease. There is no hydronephrosis on either side. There is an extrarenal pelvis on the left. Complex left cystic mass within an echogenic focus along the periphery of this mass. This mass warrants additional surveillance given the echogenic focus along the periphery. Further evaluation with pre and post contrast MRI or CT should be considered. MRI is preferred in younger patients (due to lack of ionizing radiation) and for evaluating calcified lesion(s). Prostate mildly prominent and mildly inhomogeneous in appearance. This finding warrants correlation with PSA and clinical examination. Right pleural effusion. Electronically Signed   By: Lowella Grip III M.D.   On: 01/04/2017 16:03   Ir Removal [employer] Access W/ Faxon W/o Fl Mod Sed  Result Date: 01/15/2017 CLINICAL DATA:  Port catheter that I placed 09/28/2013, has worked well without complication. Positive blood cultures and removal is requested now. EXAM: EXAM TUNNELED PORT CATHETER REMOVAL TECHNIQUE: The procedure, risks (including but not limited to bleeding, infection, organ damage ), benefits, and alternatives were explained to the patient. Questions regarding the procedure were encouraged and answered. The patient understands and consents to the procedure. Intravenous Fentanyl and Versed were administered as conscious sedation during continuous monitoring of the patient's level of consciousness and physiological / cardiorespiratory status by the radiology RN, with a total moderate sedation time of 15 minutes. Overlying skin prepped with chlorhexidine, draped in usual sterile fashion, infiltrated locally with 1% lidocaine. A small incision was made over the scar from previous placement. The port catheter was dissected free from the underlying soft tissues and removed  intact. Hemostasis was achieved. The port pocket was closed with deep interrupted and subcuticular continuous 3-0 Monocryl sutures, then covered with Dermabond. The patient tolerated the procedure well. COMPLICATIONS: COMPLICATIONS None immediate IMPRESSION: 1.  Technically successful tunneled Port catheter removal. Electronically Signed   By: Lucrezia Europe M.D.   On: 01/15/2017 16:35   Dg Chest Port 1 View  Result Date: 01/14/2017 CLINICAL DATA:  Cough and shortness of breath EXAM: PORTABLE CHEST 1 VIEW COMPARISON:  Jan 10, 2017 FINDINGS: There are pleural effusions bilaterally with bibasilar airspace consolidation. There is cardiomegaly with pulmonary venous hypertension. Port-A-Cath tip is in the superior vena cava near the cavoatrial junction. No pneumothorax. There is aortic atherosclerosis. No adenopathy. No bone lesions. IMPRESSION: Evidence of a degree of congestive heart failure. Suspect superimposed pneumonia in the lung bases. In comparison with the recent study, there is increased opacity in the right base. Changes on the left are stable. There is aortic atherosclerosis. Port-A-Cath position unchanged. No pneumothorax. Electronically Signed   By: Lowella Grip III M.D.   On: 01/14/2017 14:22   Dg Chest Port 1 View  Result Date: 01/10/2017 CLINICAL DATA:  Acute respiratory failure with hypoxia EXAM: PORTABLE CHEST 1 VIEW COMPARISON:  01/09/2017 CXR FINDINGS: Lung volumes are slightly lower than on prior exam. Patchy consolidations are noted at each lung base and right upper lobe with probable small left effusion and mild interstitial edema. No change in port catheter tip position at the cavoatrial junction nor with respect to a feeding tube seen with tip in the expected location of the body of the stomach. The heart is obscured by airspace opacities in left effusion. Aortic atherosclerosis at the arch. IMPRESSION: Persistent patchy bibasilar and right upper lobe airspace opacities likely to  reflect changes of pneumonia and/or CHF. Low lung volumes with probable small left pleural effusion. Stable support line and tube positions. Electronically Signed   By:  Ashley Royalty M.D.   On: 01/10/2017 23:02   Dg Chest Port 1 View  Result Date: 01/09/2017 CLINICAL DATA:  Respiratory failure EXAM: PORTABLE CHEST 1 VIEW COMPARISON:  01/08/2017 FINDINGS: Progression of bibasilar airspace disease, possible pneumonia. Small pleural effusions. Interval placement of feeding tube in the stomach. Port-A-Cath tip in the SVC unchanged. IMPRESSION: Progression of bibasilar infiltrates, possible pneumonia or edema. Feeding tube in the stomach. Electronically Signed   By: Franchot Gallo M.D.   On: 01/09/2017 08:19   Dg Chest Port 1 View  Result Date: 01/08/2017 CLINICAL DATA:  Respiratory failure.  Esophageal cancer. EXAM: PORTABLE CHEST 1 VIEW COMPARISON:  Yesterday FINDINGS: Bilateral central and basilar airspace disease is not significantly changed allowing for differences in patient position. Heart is upper normal in size. Right jugular Port-A-Cath is stable. No pneumothorax. IMPRESSION: Stable bilateral airspace disease in a pulmonary edema or ARDS pattern. Electronically Signed   By: Marybelle Killings M.D.   On: 01/08/2017 07:45   Dg Swallowing Func-speech Pathology  Result Date: 01/08/2017 Objective Swallowing Evaluation: Type of Study: MBS-Modified Barium Swallow Study Patient Details Name: RENALD HAITHCOCK MRN: [id number] Date of Birth: Feb 22, 1951 Today's Date: 01/08/2017 Time: SLP Start Time (ACUTE ONLY): 1000-SLP Stop Time (ACUTE ONLY): 1030 SLP Time Calculation (min) (ACUTE ONLY): 30 min Past Medical History: Past Medical History: Diagnosis Date . Cancer [facility])   Esophagus- radiation 2011 . GERD (gastroesophageal reflux disease)   takes zantac prn . Headache  . History of radiation therapy 10/19/2013-12/05/2013  pyriform sinus 69.96 Gy/10f . HOH (hard of hearing)  . Hyperlipemia  . Hypertension  . Seizures ([place]    alcohol withdrawls- 2001 Past Surgical History: Past Surgical History: Procedure Laterality Date . CARDIOVASCULAR STRESS TEST  10/09/09  normal nuclearr stress test, EF 57% ([name] . LARYNGOSCOPY N/A 09/15/2013  Procedure: LARYNGOSCOPY;  Surgeon: DMelida Quitter MD;  [facility]  Service: ENT;  Laterality: N/A;  direct laryngoscopy with biopsy and esophagoscopy . NO PAST SURGERIES   . 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 CXR significant for pulmonary edema. Treated with IV lasix. Cardiology was consulted and patient underwent right heart cath on 5/18 in which revealed multivessel CAD with plans to do PCI of RCA next week if renal function is stable. On 5/10 patient became tachypneic and hypoxic, given 40 mg IV lasix with 3058moutput, with improvement. CXR shows worsening airspace opacities compatible with PNA or asymmetric pulmonary edema, mild cardiomegaly without vascular congestion. Pt was seen in OP SLP tx over 11 months, 4 visits. Pt reported that swallowing issues had resolved, but was  noncompliant with exercise program. Pt had G tube during RXT, it was removed in 2015.  In early 2016 SLP reported consistent throat clearing/cough observed with thin liquids, small sip chin tuck and effortful swallow x2 eliminated cough. No MBS or FEES on record.  No Data Recorded Assessment / Plan / Recommendation CHL IP CLINICAL IMPRESSIONS 01/08/2017 Clinical Impression Pt demonstrates severe to profound oropharyngeal dysphagia  secondary to fibrotic musculature following RXT in 2015  without compliance to recommendations for exercise program. Due to severe lack of mobility of hyolaryngeal complex and pharyngeal musculature there is minimal ability to protect airway during the swallow leading to immediate severe penetration to the cords before/during the swallow. Pt does appear to achieve moderate glottic closure to hold penetrates in the vestibule, but as soon as he releases the swallow, they spill into trachea. There is absent sensation from pt. In addition, due to lack of pharyngeal peristalsis and hyolaryngeal excursion, at least 50% of the bolus remains pooled in the vallecula and pyriforms post swallow, with increased severity with thick liquids. Attempted all positional compensations without success. Best strategy was a super supraglottic swallow to capitilaze on glottic closure and expectoration of penetrate. Even with this strategy, severe aspiration occurs before and after attempts. Pt verbalizes desire to continue all treatments to prolong life and would not want to accept risk of aspiration. Given dx of pna and need for further medical interventions, aggressive prevention of aspiration of PO is recommended. Suggest short term alternate means of nutrition until cardiac procedure is complete and pts respiratory status is somewhat improved.  Recommend ice chips after oral care only at this time.  Will plan to retest next week, but expect poor spontaneous recovery or potential to improve with therapy over the short term given length of time since RXT and severity of impairment.  SLP Visit Diagnosis Dysphagia, oropharyngeal phase (R13.12) Attention and concentration deficit following -- Frontal lobe and executive function deficit following -- Impact on safety and function Severe aspiration risk;Risk for inadequate nutrition/hydration   CHL IP TREATMENT RECOMMENDATION 01/08/2017 Treatment Recommendations Therapy as outlined in treatment  plan below   Prognosis 01/08/2017 Prognosis for Safe Diet Advancement Guarded Barriers to Reach Goals Severity of deficits;Time post onset Barriers/Prognosis Comment -- CHL IP DIET RECOMMENDATION 01/08/2017 SLP Diet Recommendations NPO;Alternative means - temporary;Ice chips PRN after oral care Liquid Administration via -- Medication Administration Via alternative means Compensations -- Postural Changes --   CHL IP OTHER RECOMMENDATIONS 01/08/2017 Recommended Consults -- Oral Care Recommendations Oral care before and after PO Other Recommendations --   CHL IP FOLLOW UP RECOMMENDATIONS 01/08/2017 Follow up Recommendations Skilled Nursing facility   [facility] IP FREQUENCY AND DURATION 01/08/2017 Speech Therapy Frequency (ACUTE ONLY) min 2x/week Treatment Duration 2 weeks      CHL IP ORAL PHASE 01/08/2017 Oral Phase Impaired Oral - Pudding Teaspoon -- Oral - Pudding Cup -- Oral - Honey Teaspoon -- Oral - Honey Cup -- Oral - Nectar Teaspoon -- Oral - Nectar Cup -- Oral - Nectar Straw -- Oral - Thin Teaspoon -- Oral - Thin Cup -- Oral - Thin Straw -- Oral - Puree -- Oral - Mech Soft -- Oral - Regular -- Oral - Multi-Consistency -- Oral - Pill -- Oral Phase - Comment --  CHL IP PHARYNGEAL PHASE 01/08/2017 Pharyngeal Phase Impaired Pharyngeal- Pudding Teaspoon -- Pharyngeal -- Pharyngeal- Pudding Cup -- Pharyngeal -- Pharyngeal- Honey Teaspoon Delayed swallow initiation-pyriform sinuses;Reduced pharyngeal peristalsis;Reduced epiglottic inversion;Reduced anterior laryngeal mobility;Reduced laryngeal elevation;Reduced airway/laryngeal closure;Reduced tongue base retraction;Penetration/Aspiration before swallow;Penetration/Aspiration during swallow;Penetration/Apiration after swallow;Significant aspiration (Amount);Pharyngeal residue - valleculae;Pharyngeal residue - pyriform Pharyngeal -- Pharyngeal- Honey Cup -- Pharyngeal -- Pharyngeal- Nectar Teaspoon -- Pharyngeal -- Pharyngeal- Nectar Cup Delayed swallow initiation-pyriform  sinuses;Reduced pharyngeal peristalsis;Reduced epiglottic inversion;Reduced anterior laryngeal mobility;Reduced laryngeal elevation;Reduced airway/laryngeal closure;Reduced tongue base retraction;Penetration/Aspiration before swallow;Penetration/Aspiration during swallow;Penetration/Apiration after swallow;Significant aspiration (Amount);Pharyngeal residue - valleculae;Pharyngeal residue - pyriform Pharyngeal -- Pharyngeal- Nectar Straw -- Pharyngeal -- Pharyngeal- Thin Teaspoon --  Pharyngeal -- Pharyngeal- Thin Cup Delayed swallow initiation-pyriform sinuses;Reduced pharyngeal peristalsis;Reduced epiglottic inversion;Reduced anterior laryngeal mobility;Reduced laryngeal elevation;Reduced airway/laryngeal closure;Reduced tongue base retraction;Penetration/Aspiration before swallow;Penetration/Aspiration during swallow;Penetration/Apiration after swallow;Significant aspiration (Amount);Pharyngeal residue - valleculae;Pharyngeal residue - pyriform Pharyngeal -- Pharyngeal- Thin Straw -- Pharyngeal -- Pharyngeal- Puree Reduced pharyngeal peristalsis;Reduced epiglottic inversion;Reduced anterior laryngeal mobility;Reduced laryngeal elevation;Reduced airway/laryngeal closure;Reduced tongue base retraction;Penetration/Apiration after swallow;Significant aspiration (Amount);Pharyngeal residue - valleculae;Pharyngeal residue - pyriform;Delayed swallow initiation-vallecula Pharyngeal -- Pharyngeal- Mechanical Soft -- Pharyngeal -- Pharyngeal- Regular -- Pharyngeal -- Pharyngeal- Multi-consistency -- Pharyngeal -- Pharyngeal- Pill -- Pharyngeal -- Pharyngeal Comment --  CHL IP CERVICAL ESOPHAGEAL PHASE 01/08/2017 Cervical Esophageal Phase Impaired Pudding Teaspoon -- Pudding Cup -- Honey Teaspoon -- Honey Cup -- Nectar Teaspoon -- Nectar Cup -- Nectar Straw -- Thin Teaspoon -- Thin Cup -- Thin Straw -- Puree -- Mechanical Soft -- Regular -- Multi-consistency -- Pill -- Cervical Esophageal Comment reduced CP opening No  flowsheet data found. Herbie Baltimore, [place] CCC-SLP [phone] Lynann Beaver 01/08/2017, 10:54 AM               ECHO 01/03/17 - Left ventricle: The cavity size was normal. Wall thickness was normal. The estimated ejection fraction was in the range of 10% to 15%. Diffuse hypokinesis. DIastolic function is abnormal, indeterminant grade. - Aortic valve: There was moderate regurgitation. Valve area (VTI): 1.08 cm^2. Valve area (Vmax): 1.5 cm^2. Valve area (Vmean): 1.5 cm^2. - Mitral valve: There was moderate regurgitation. - Left atrium: The atrium was mildly dilated. - Right ventricle: Systolic function was mildly to moderately reduced. - Tricuspid valve: There was moderate regurgitation. - Pulmonary arteries: Systolic pressure was moderately increased. PA peak pressure: 49 mm Hg (S).  Right/Left Heart Cath and Coronary Angiography 01/05/2017     Prox RCA to Mid RCA lesion, 99 %stenosed.  Mid RCA lesion, 30 %stenosed.  Dist RCA lesion, 40 %stenosed.  RPDA lesion, 20 %stenosed.  Prox Cx lesion, 100 %stenosed.  Ost Ramus to Ramus lesion, 50 %stenosed.  Ost 3rd Diag to 3rd Diag lesion, 30 %stenosed.  Mid LAD lesion, 40 %stenosed.  Ost LAD to Prox LAD lesion, 20 %stenosed.  Ost LM to LM lesion, 40 %stenosed.  Hemodynamic findings consistent with mild pulmonary hypertension.  1. Severe calcific disease in the proximal to mid RCA 2. 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 Moderate left main and mid LAD stenosis 4. Elevated filling pressures  Recommendations: He has multivessel CAD. I think the RCA can be approached with PCI but will likely require atherectomy prior to stenting. I would treat the remainder of his CAD medically. Will stage PCI given renal insufficiency. Hydrate gently tonight. May need diuresis tomorrow given elevated filling pressures. Would anticipate PCI on Thursday or Friday of this week if renal function remains stable.     - PEG Placement 01/13/2017 - PICC RUE 01/16/2017   Subjective: Patient reports significant improvement in dyspnea, no chest pain. Ready to go home.   Discharge Exam: Vitals:   01/17/17 0300 01/17/17 0500  BP: (!) 119/94 130/75  Pulse: 94 85  Resp: (!) 24 (!) 26  Temp: 98 F (36.7 C) 98.6 F (37 C)   General exam: Thin male in no distress appearing older than stated age. High spirits.  Respiratory system: No accessory muscle use, 2L by [place]. Clear bilaterally. Cardiovascular system: Regular, no murmur. No LE edema. Gastrointestinal system: Abd Soft NTND, +BS. PEG in situ LUQ nontender, no discharge. Extremities: No LE edema, no deformity. RUE PICC c/d/i  Skin: No lesions or rash. Right upper chest port removed, bandage c/d/i without erythema  Labs: BNP (last 3 results)  Recent Labs  01/02/17 0658 01/05/17 0625 01/14/17 0608  BNP 1,999.4* 2,182.6* >8,768.1*   Basic Metabolic Panel:  Recent Labs Lab 01/13/17 0456 01/14/17 0900 01/15/17 0458 01/16/17 0259 01/17/17 0540  NA 130* 133* 133* 131* 128*  K 4.7 4.6 4.0 3.7 3.8  CL 95* 99* 96* 96* 93*  CO2 _0 GLUCOSE 113* 77 121* 103* 157*  BUN 20 20 26* 32* 28*  CREATININE 0.85 0.98 1.07 1.04 0.89  CALCIUM 8.2* 7.4* 8.2* 8.3* 8.1*   Liver Function Tests: No results for input(s): AST, ALT, ALKPHOS, BILITOT, PROT, ALBUMIN in the last 168 hours. No results for input(s): LIPASE, AMYLASE in the last 168 hours. No results for input(s): AMMONIA in the last 168 hours. CBC:  Recent Labs Lab 01/11/17 0523 01/12/17 0446 01/13/17 0456 01/14/17 0608 01/15/17 0458  WBC 12.6* 13.6* 14.6* 13.7* 13.8*  HGB 10.9* 11.7* 12.6* 12.5* 12.6*  HCT 33.3* 35.7* 38.8* 38.1* 38.9*  MCV 86.5 86.4 84.5 85.2 84.6  PLT 172 193 234 283 316   Cardiac Enzymes:  Recent Labs Lab 01/14/17 1250 01/14/17 2232 01/15/17 0458  TROPONINI 0.05* 0.09* 0.09*   BNP: Invalid input(s): POCBNP CBG:  Recent Labs Lab 01/16/17 2000  01/17/17 0051 01/17/17 0656 01/17/17 0744 01/17/17 1102  GLUCAP 128* 142* 139* 153* 121*   D-Dimer No results for input(s): DDIMER in the last 72 hours. Hgb A1c No results for input(s): HGBA1C in the last 72 hours. Lipid Profile No results for input(s): CHOL, HDL, LDLCALC, TRIG, CHOLHDL, LDLDIRECT in the last 72 hours. Thyroid function studies No results for input(s): TSH, T4TOTAL, T3FREE, THYROIDAB in the last 72 hours.  Invalid input(s): FREET3 Anemia work up No results for input(s): VITAMINB12, FOLATE, FERRITIN, TIBC, IRON, RETICCTPCT in the last 72 hours. Urinalysis    Component Value Date/Time   COLORURINE YELLOW 08/04/2014 2007   APPEARANCEUR CLEAR 08/04/2014 2007   LABSPEC 1.008 08/04/2014 2007   PHURINE 6.0 08/04/2014 2007   GLUCOSEU NEGATIVE 08/04/2014 2007   HGBUR NEGATIVE 08/04/2014 2007   BILIRUBINUR NEGATIVE 08/04/2014 2007   KETONESUR NEGATIVE 08/04/2014 2007   PROTEINUR NEGATIVE 08/04/2014 2007   UROBILINOGEN 1.0 08/04/2014 2007   NITRITE NEGATIVE 08/04/2014 2007   LEUKOCYTESUR TRACE (A) 08/04/2014 2007    Microbiology Recent Results (from the past 240 hour(s))  Culture, blood (Routine X 2) w Reflex to ID Panel     Status: None   Collection Time: 01/07/17  3:43 PM  Result Value Ref Range Status   Specimen Description BLOOD RIGHT ANTECUBITAL  Final   Special Requests IN PEDIATRIC BOTTLE Blood Culture adequate volume  Final   Culture NO GROWTH 5 DAYS  Final   Report Status 01/12/2017 FINAL  Final  Culture, blood (Routine X 2) w Reflex to ID Panel     Status: Abnormal   Collection Time: 01/07/17  3:47 PM  Result Value Ref Range Status   Specimen Description BLOOD RIGHT HAND  Final   Special Requests IN PEDIATRIC BOTTLE Blood Culture adequate volume  Final   Culture  Setup Time   Final    GRAM POSITIVE COCCI IN CLUSTERS IN PEDIATRIC BOTTLE CRITICAL RESULT CALLED TO, READ BACK BY AND VERIFIED WITH: Leonie Green Pharm.D. 13:30 01/08/17 ([email or web address])    Culture  STAPHYLOCOCCUS AUREUS (A)  Final   Report Status 01/10/2017 FINAL  Final   Organism  ID, Bacteria STAPHYLOCOCCUS AUREUS  Final      Susceptibility   Staphylococcus aureus - MIC*    CIPROFLOXACIN <=0.5 SENSITIVE Sensitive     ERYTHROMYCIN <=0.25 SENSITIVE Sensitive     GENTAMICIN <=0.5 SENSITIVE Sensitive     OXACILLIN 0.5 SENSITIVE Sensitive     TETRACYCLINE <=1 SENSITIVE Sensitive     VANCOMYCIN <=0.5 SENSITIVE Sensitive     TRIMETH/SULFA <=10 SENSITIVE Sensitive     CLINDAMYCIN <=0.25 SENSITIVE Sensitive     RIFAMPIN <=0.5 SENSITIVE Sensitive     Inducible Clindamycin NEGATIVE Sensitive     * STAPHYLOCOCCUS AUREUS  Blood Culture ID Panel (Reflexed)     Status: Abnormal   Collection Time: 01/07/17  3:47 PM  Result Value Ref Range Status   Enterococcus species NOT DETECTED NOT DETECTED Final   Listeria monocytogenes NOT DETECTED NOT DETECTED Final   Staphylococcus species DETECTED (A) NOT DETECTED Final    Comment: CRITICAL RESULT CALLED TO, READ BACK BY AND VERIFIED WITH: MVonita Moss.D. 13:30 01/08/17 ([email or web address])    Staphylococcus aureus DETECTED (A) NOT DETECTED Final    Comment: Methicillin (oxacillin) susceptible Staphylococcus aureus (MSSA). Preferred therapy is anti staphylococcal beta lactam antibiotic (Cefazolin or Nafcillin), unless clinically contraindicated. CRITICAL RESULT CALLED TO, READ BACK BY AND VERIFIED WITH: M. Radford Pax Pharm.D. 13:30 01/08/17 ([email or web address])    Methicillin resistance NOT DETECTED NOT DETECTED Final   Streptococcus species NOT DETECTED NOT DETECTED Final   Streptococcus agalactiae NOT DETECTED NOT DETECTED Final   Streptococcus pneumoniae NOT DETECTED NOT DETECTED Final   Streptococcus pyogenes NOT DETECTED NOT DETECTED Final   Acinetobacter baumannii NOT DETECTED NOT DETECTED Final   Enterobacteriaceae species NOT DETECTED NOT DETECTED Final   Enterobacter cloacae complex NOT DETECTED NOT DETECTED Final   Escherichia coli NOT DETECTED NOT DETECTED  Final   Klebsiella oxytoca NOT DETECTED NOT DETECTED Final   Klebsiella pneumoniae NOT DETECTED NOT DETECTED Final   Proteus species NOT DETECTED NOT DETECTED Final   Serratia marcescens NOT DETECTED NOT DETECTED Final   Haemophilus influenzae NOT DETECTED NOT DETECTED Final   Neisseria meningitidis NOT DETECTED NOT DETECTED Final   Pseudomonas aeruginosa NOT DETECTED NOT DETECTED Final   Candida albicans NOT DETECTED NOT DETECTED Final   Candida glabrata NOT DETECTED NOT DETECTED Final   Candida krusei NOT DETECTED NOT DETECTED Final   Candida parapsilosis NOT DETECTED NOT DETECTED Final   Candida tropicalis NOT DETECTED NOT DETECTED Final  MRSA PCR Screening     Status: None   Collection Time: 01/07/17  5:21 PM  Result Value Ref Range Status   MRSA by PCR NEGATIVE NEGATIVE Final    Comment:        The GeneXpert MRSA Assay (FDA approved for NASAL specimens only), is one component of a comprehensive MRSA colonization surveillance program. It is not intended to diagnose MRSA infection nor to guide or monitor treatment for MRSA infections.   Culture, blood (Routine X 2) w Reflex to ID Panel     Status: Abnormal   Collection Time: 01/09/17 10:15 AM  Result Value Ref Range Status   Specimen Description BLOOD RIGHT HAND  Final   Special Requests   Final    Blood Culture results may not be optimal due to an inadequate volume of blood received in culture bottles   Culture  Setup Time   Final    GRAM POSITIVE COCCI IN CLUSTERS Organism ID to follow IN PEDIATRIC BOTTLE CRITICAL RESULT CALLED TO, READ BACK BY AND VERIFIED  WITH: Carlye Grippe PHARMD, AT 4709 01/10/17 BY D. VANHOOK    Culture STAPHYLOCOCCUS AUREUS (A)  Final   Report Status 01/12/2017 FINAL  Final   Organism ID, Bacteria STAPHYLOCOCCUS AUREUS  Final      Susceptibility   Staphylococcus aureus - MIC*    CIPROFLOXACIN <=0.5 SENSITIVE Sensitive     ERYTHROMYCIN <=0.25 SENSITIVE Sensitive     GENTAMICIN <=0.5 SENSITIVE  Sensitive     OXACILLIN <=0.25 SENSITIVE Sensitive     TETRACYCLINE <=1 SENSITIVE Sensitive     VANCOMYCIN <=0.5 SENSITIVE Sensitive     TRIMETH/SULFA <=10 SENSITIVE Sensitive     CLINDAMYCIN <=0.25 SENSITIVE Sensitive     RIFAMPIN <=0.5 SENSITIVE Sensitive     Inducible Clindamycin NEGATIVE Sensitive     * STAPHYLOCOCCUS AUREUS  Blood Culture ID Panel (Reflexed)     Status: Abnormal   Collection Time: 01/09/17 10:15 AM  Result Value Ref Range Status   Enterococcus species NOT DETECTED NOT DETECTED Final   Listeria monocytogenes NOT DETECTED NOT DETECTED Final   Staphylococcus species DETECTED (A) NOT DETECTED Final    Comment: CRITICAL RESULT CALLED TO, READ BACK BY AND VERIFIED WITH: V. JOHNSTON PHARMD, AT 0848 01/10/17 BY D. VANHOOK    Staphylococcus aureus DETECTED (A) NOT DETECTED Final    Comment: Methicillin (oxacillin) susceptible Staphylococcus aureus (MSSA). Preferred therapy is anti staphylococcal beta lactam antibiotic (Cefazolin or Nafcillin), unless clinically contraindicated. CRITICAL RESULT CALLED TO, READ BACK BY AND VERIFIED WITH: V. JOHNSTON PHARMD, AT 0848 01/10/17 BY D. VANHOOK    Methicillin resistance NOT DETECTED NOT DETECTED Final   Streptococcus species NOT DETECTED NOT DETECTED Final   Streptococcus agalactiae NOT DETECTED NOT DETECTED Final   Streptococcus pneumoniae NOT DETECTED NOT DETECTED Final   Streptococcus pyogenes NOT DETECTED NOT DETECTED Final   Acinetobacter baumannii NOT DETECTED NOT DETECTED Final   Enterobacteriaceae species NOT DETECTED NOT DETECTED Final   Enterobacter cloacae complex NOT DETECTED NOT DETECTED Final   Escherichia coli NOT DETECTED NOT DETECTED Final   Klebsiella oxytoca NOT DETECTED NOT DETECTED Final   Klebsiella pneumoniae NOT DETECTED NOT DETECTED Final   Proteus species NOT DETECTED NOT DETECTED Final   Serratia marcescens NOT DETECTED NOT DETECTED Final   Haemophilus influenzae NOT DETECTED NOT DETECTED Final    Neisseria meningitidis NOT DETECTED NOT DETECTED Final   Pseudomonas aeruginosa NOT DETECTED NOT DETECTED Final   Candida albicans NOT DETECTED NOT DETECTED Final   Candida glabrata NOT DETECTED NOT DETECTED Final   Candida krusei NOT DETECTED NOT DETECTED Final   Candida parapsilosis NOT DETECTED NOT DETECTED Final   Candida tropicalis NOT DETECTED NOT DETECTED Final  Culture, blood (routine x 2)     Status: None   Collection Time: 01/11/17  2:58 PM  Result Value Ref Range Status   Specimen Description BLOOD RIGHT ANTECUBITAL  Final   Special Requests   Final    BOTTLES DRAWN AEROBIC ONLY Blood Culture adequate volume   Culture NO GROWTH 5 DAYS  Final   Report Status 01/16/2017 FINAL  Final  Culture, blood (routine x 2)     Status: None   Collection Time: 01/11/17  3:02 PM  Result Value Ref Range Status   Specimen Description BLOOD RIGHT HAND  Final   Special Requests   Final    BOTTLES DRAWN AEROBIC ONLY Blood Culture adequate volume   Culture NO GROWTH 5 DAYS  Final   Report Status 01/16/2017 FINAL  Final    Time coordinating  discharge: Approximately 40 minutes  Vance Gather, MD  [facility] 01/17/2017, 12:07 PM Pager [phone]

## 2017-01-17 NOTE — Care Management Note (Signed)
Case Management Note  Patient Details  Name: Collin Young MRN: [id number] Date of Birth: 12/28/1950  Subjective/Objective:  Spoke with [name], Rep for [facility] to ensure all arrangements for IV abx are secured. Will have Q8h  Ancef, LD to be 1300 today at hospital so that he can receive LD at [place].   [name], RN who is caring for him today will speak with MD, currently rounding on unit to make priority for expedient discharge.                  Action/Plan:CM will sign off for now but will be available should additional discharge needs arise or disposition change.    Expected Discharge Date:                  Expected Discharge Plan:  [place]  In-House Referral:  NA  Discharge planning Services  CM Consult  Post Acute Care Choice:  Home Health, Durable Medical Equipment Choice offered to:  Patient  DME Arranged:  IV pump/equipment DME Agency:  [place] Arranged:  RN, IV Antibiotics HH Agency:  [place]  Status of Service:  Completed, signed off  If discussed at [place] of Stay Meetings, dates discussed:    Additional Comments:  [name], RN 01/17/2017, 10:23 AM

## 2017-01-17 NOTE — Progress Notes (Signed)
PHARMACY CONSULT NOTE FOR:  OUTPATIENT  PARENTERAL ANTIBIOTIC THERAPY (OPAT)  Indication: MSSA bacteremia  Regimen: Cefazolin 1 gm every 8 hours End date: 02/13/17  IV antibiotic discharge orders are pended. To discharging provider:  please sign these orders via discharge navigator,  Select New Orders & click on the button choice - Manage This Unsigned Work.     Thank you for allowing pharmacy to be a part of this patient's care.  Ihor Austin, PharmD PGY1 Pharmacy Resident Pager: [phone] 01/17/2017, 10:56 AM

## 2017-01-17 NOTE — Progress Notes (Signed)
Pt being d/c to home with home health, PICC line single lumen for prolonged IV ABX to right arm intact, condition stable, discharge education complete.  Edward Qualia RN

## 2017-01-17 NOTE — Progress Notes (Signed)
Progress Note  Patient Name: Collin Young Date of Encounter: 01/17/2017  Primary Cardiologist: Oval Linsey  Subjective   Roughly 800 mL negative fluid balance despite switching to by mouth diuretics. No complaints of dyspnea at rest. Weight recorded yesterday was clearly erroneous. Current weight is equal to the lowest weight recorded during this admission.  Inpatient Medications    Scheduled Meds: . aspirin  81 mg Per Tube Daily  . atorvastatin  20 mg Per Tube Daily  . budesonide (PULMICORT) nebulizer solution  0.5 mg Nebulization BID  . carvedilol  6.25 mg Per Tube BID WC  . furosemide  40 mg Per Tube Daily  . isosorbide mononitrate  10 mg Per Tube BID  . losartan  25 mg Per Tube Daily  . multivitamin with minerals  1 tablet Per Tube Daily  . sodium chloride flush  10-40 mL Intracatheter Q12H  . sodium chloride flush  3 mL Intravenous Q12H  . spironolactone  25 mg Per Tube Daily   Continuous Infusions: .  ceFAZolin (ANCEF) IV Stopped (01/17/17 9702)  . feeding supplement (JEVITY 1.2 CAL) 1,000 mL (01/16/17 1931)  . nitroGLYCERIN Stopped (01/15/17 1700)   PRN Meds: acetaminophen, guaiFENesin-dextromethorphan, HYDROcodone-acetaminophen, HYDROmorphone (DILAUDID) injection, ipratropium-albuterol, lip balm, MUSCLE RUB, ondansetron (ZOFRAN) IV, phenol, polyethylene glycol, polyvinyl alcohol, sodium chloride, sodium chloride flush, sodium chloride flush, sodium chloride flush, white petrolatum   Vital Signs    Vitals:   01/16/17 1956 01/17/17 0300 01/17/17 0500 01/17/17 0805  BP: 111/64 (!) 119/94 130/75   Pulse: 71 94 85   Resp: 20 (!) 24 (!) 26   Temp: 98 F (36.7 C) 98 F (36.7 C) 98.6 F (37 C)   TempSrc: Oral Oral Oral   SpO2: 98% (!) 88% 100% 100%  Weight:   117 lb 3.2 oz (53.2 kg)   Height:        Intake/Output Summary (Last 24 hours) at 01/17/17 0845 Last data filed at 01/17/17 0500  Gross per 24 hour  Intake               80 ml  Output              700  ml  Net             -620 ml   Filed Weights   01/15/17 0552 01/16/17 0419 01/17/17 0500  Weight: 118 lb 12.8 oz (53.9 kg) 98 lb 14.4 oz (44.9 kg) 117 lb 3.2 oz (53.2 kg)    Telemetry    Sinus rhythm, occasional PVCs, no sustained arrhythmia - Personally Reviewed  ECG    No new tracing - Personally Reviewed  Physical Exam  Very thin and frail appearing, coarse, appears comfortable and smiling. GEN: No acute distress.   Neck: No JVD Cardiac: RRR, no murmurs, rubs, or gallops.  Respiratory:  right lung still sounds little "junky". GI: Soft, nontender, non-distended , gastrostomy tube MS: No edema; No deformity. Neuro:  Nonfocal  Psych: Normal affect   Labs    Chemistry Recent Labs Lab 01/15/17 0458 01/16/17 0259 01/17/17 0540  NA 133* 131* 128*  K 4.0 3.7 3.8  CL 96* 96* 93*  CO2 26 26 29   GLUCOSE 121* 103* 157*  BUN 26* 32* 28*  CREATININE 1.07 1.04 0.89  CALCIUM 8.2* 8.3* 8.1*  GFRNONAA >60 >60 >60  GFRAA >60 >60 >60  ANIONGAP 11 9 6      Hematology Recent Labs Lab 01/13/17 0456 01/14/17 0608 01/15/17 0458  WBC 14.6*  13.7* 13.8*  RBC 4.59 4.47 4.60  HGB 12.6* 12.5* 12.6*  HCT 38.8* 38.1* 38.9*  MCV 84.5 85.2 84.6  MCH 27.5 28.0 27.4  MCHC 32.5 32.8 32.4  RDW 14.1 14.4 14.3  PLT 234 283 316    Cardiac Enzymes Recent Labs Lab 01/14/17 1250 01/14/17 2232 01/15/17 0458  TROPONINI 0.05* 0.09* 0.09*   No results for input(s): TROPIPOC in the last 168 hours.   BNP Recent Labs Lab 01/14/17 0608  BNP >4,500.0*     DDimer No results for input(s): DDIMER in the last 168 hours.   Radiology    Dg Chest 2 View  Result Date: 01/17/2017 CLINICAL DATA:  Acute respiratory failure. EXAM: CHEST  2 VIEW COMPARISON:  One-view chest x-ray 01/14/2017 FINDINGS: The right IJ Port-A-Cath has been removed. A right-sided PICC line is now in place. The tip terminates at the right atrium. The heart enlarged. The edema and bilateral effusions are again noted.  Extensive lower lobe airspace disease is present, left greater than right. The lung apices are clear. IMPRESSION: 1. Improved aeration in lung volumes with persistent interstitial edema and bilateral pleural effusions suggesting congestive heart failure. 2. Asymmetric left lower lobe airspace consolidation. While this may represent atelectasis, the extent of disease on the left is concerning for left lower lobe pneumonia. 3. Aortic atherosclerosis. 4. Interval removal of left IJ Port-A-Cath and placement of right-sided PICC line. The tip is in the right atrium. Electronically Signed   By: San Morelle M.D.   On: 01/17/2017 07:42   Ir Removal [employer] Access W/ [employer] W/o Fl Mod Sed  Result Date: 01/15/2017 CLINICAL DATA:  Port catheter that I placed 09/28/2013, has worked well without complication. Positive blood cultures and removal is requested now. EXAM: EXAM TUNNELED PORT CATHETER REMOVAL TECHNIQUE: The procedure, risks (including but not limited to bleeding, infection, organ damage ), benefits, and alternatives were explained to the patient. Questions regarding the procedure were encouraged and answered. The patient understands and consents to the procedure. Intravenous Fentanyl and Versed were administered as conscious sedation during continuous monitoring of the patient's level of consciousness and physiological / cardiorespiratory status by the radiology RN, with a total moderate sedation time of 15 minutes. Overlying skin prepped with chlorhexidine, draped in usual sterile fashion, infiltrated locally with 1% lidocaine. A small incision was made over the scar from previous placement. The port catheter was dissected free from the underlying soft tissues and removed intact. Hemostasis was achieved. The port pocket was closed with deep interrupted and subcuticular continuous 3-0 Monocryl sutures, then covered with Dermabond. The patient tolerated the procedure well. COMPLICATIONS: COMPLICATIONS None  immediate IMPRESSION: 1.  Technically successful tunneled Port catheter removal. Electronically Signed   By: Lucrezia Europe M.D.   On: 01/15/2017 16:35    Cardiac Studies     CATH 01/05/17 Diagnostic Diagram         Most Recent Value  Fick Cardiac Output 2.1 L/min  Fick Cardiac Output Index 1.31 (L/min)/BSA  RA A Wave 6 mmHg  RA V Wave 5 mmHg  RA Mean 3 mmHg  RV Systolic Pressure 50 mmHg  RV Diastolic Pressure -4 mmHg  RV EDP 8 mmHg  PA Systolic Pressure 46 mmHg  PA Diastolic Pressure 17 mmHg  PA Mean 29 mmHg  PW A Wave 26 mmHg  PW V Wave 33 mmHg  PW Mean 24 mmHg  AO Systolic Pressure 510 mmHg  AO Diastolic Pressure 63 mmHg  AO Mean 87 mmHg  LV  Systolic Pressure 132 mmHg  LV Diastolic Pressure 3 mmHg  LV EDP 15 mmHg  Arterial Occlusion Pressure Extended Systolic Pressure 440 mmHg  Arterial Occlusion Pressure Extended Diastolic Pressure 64 mmHg  Arterial Occlusion Pressure Extended Mean Pressure 86 mmHg  Left Ventricular Apex Extended Systolic Pressure 102 mmHg  Left Ventricular Apex Extended Diastolic Pressure 3 mmHg  Left Ventricular Apex Extended EDP Pressure 18 mmHg  QP/QS 1  TPVR Index 22.14 HRUI  TSVR Index 66.42 HRUI  PVR SVR Ratio 0.06  TPVR/TSVR Ratio 0.33     Patient Profile     66 y.o. male with severe CMP (EF 10%) and new onset CHF with acute pulmonary edema, 2-vessel CAD with occluded LCX and long calcified RCA stenosis, pneumonia, MSSA bacteremia, head and neck cancer requiring feeding tube. Currently on medical therapy for heart failure, with potential for attempted high risk revascularization of the right coronary artery if clinical status improves.  Assessment & Plan    1. CHF: Remains euvolemic after switching to oral diuretics. On beta blockers and ARB and relatively low doses, limited by blood pressure, further titration as outpatient.  2. CAD: On beta blockers and long-acting nitrates, without angina pectoris. Consider high risk  PCI/rotablation-stent of RCA in future. 3. MSSA bacteremia: No clinical signs of endocarditis, echo does not suggest severe valvular disease. Unable to perform TEE due to esophageal cancer/surgery. 4. PAH: Pulmonary gradient is low. Pulmonary hypertension is attributable to left heart failure. 5. Hyponatremia - likely a reflection of the severity of his heart failure. Most of his intake is via gastrostomy tube, but may have to limit his intake of sips of water and chips.  Signed, Sanda Klein, MD  01/17/2017, 8:45 AM

## 2017-01-18 MED FILL — SPIRONOLACTONE 25 MG TABLET: 25 | 30 days supply | Qty: 30 | Fill #0

## 2017-01-18 MED FILL — LOSARTAN POTASSIUM 25 MG TA: 25 | 30 days supply | Qty: 30 | Fill #0

## 2017-01-18 MED FILL — CARVEDILOL 6.25 MG TABLET: 6.25 | 30 days supply | Qty: 60 | Fill #0

## 2017-01-18 MED FILL — ISOSORBIDE MN 10 MG TABLET: 10 | 30 days supply | Qty: 60 | Fill #0

## 2017-01-18 MED FILL — BUDESONIDE 0.5 MG/2 ML SUSP: 0.5 | 30 days supply | Qty: 120 | Fill #0

## 2017-01-18 MED FILL — FUROSEMIDE 40 MG TABLET: 40 | 30 days supply | Qty: 30 | Fill #0

## 2017-01-18 MED FILL — IPRAT-ALBUT 0.5-3(2.5) MG/3: 0.5-2.5 (3) | 20 days supply | Qty: 360 | Fill #0

## 2017-01-26 NOTE — Progress Notes (Signed)
Cardiology Office Note   Date:  01/27/2017   ID:  Collin Young, Collin Young Dec 12, 1950, MRN [id number]  PCP:  [name], MD  Cardiologist:   Skeet Latch, MD   Chief Complaint  Patient presents with  . Hospitalization Follow-up      History of Present Illness: Collin Young is a 66 y.o. 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  Collin Young was admitted 12/2016 with progressive shortness of breath.  BNP was >2000.  Echo revealed LVEF 10-15% with diffuse hypokinesis, moderate AR and moderate AR.  PASP was 49 mmHg.  He underwent left heart catheterization and was found to have 100% proximal LCx, 99% prox-mid RCA and otherwise mild-moderate CAD.  There was consideration of RCA PCI.  However, this would have required hemodynamic support and was deferred given his frailty and comorbidities.  He was treated with milrinone and IV lasix.  Milrinone was weaned prior to discharge.  During that hospitalization he was also treated from MSSA bacteremia.  He was unable to get a TEE due to his prior cancer.  His Port was removed and a PICC line was placed.   He had a  PEG tube placed due to frank aspiration.  Since being discharged from the hospital Collin Young has been feeling well.  He denies chest pain or shortness of breath.  He has been working outside in his yard.  He sometimes has to use his inhaler, which helps.  He denies lower extremity edema, orthopnea or PND.  His weight has been stable.  He continues to get tube feeds without complication.  He is also getting Ancef via PICC for 28 days.  He will follow up with ID after that.    Past Medical History:  Diagnosis Date  . Cancer [facility])    Esophagus- radiation 2011  . GERD (gastroesophageal reflux disease)    takes zantac prn  . Headache   . History of  radiation therapy 10/19/2013-12/05/2013   pyriform sinus 69.96 Gy/9f  . HOH (hard of hearing)   . Hyperlipemia   . Hypertension   . [place] [place]    alcohol withdrawls- 2001    Past Surgical History:  Procedure Laterality Date  . CARDIOVASCULAR STRESS TEST  10/09/09   normal nuclearr stress test, EF 57% ([name]  . IR GASTROSTOMY TUBE MOD SED  01/13/2017  . IR REMOVAL TUN ACCESS W/ PORT W/O FL MOD SED  01/15/2017  . LARYNGOSCOPY N/A 09/15/2013   Procedure: LARYNGOSCOPY;  Surgeon: DMelida Quitter MD;  [facility]  Service: ENT;  Laterality: N/A;  direct laryngoscopy with biopsy and esophagoscopy  . NO PAST SURGERIES    . RIGHT/LEFT HEART CATH AND CORONARY ANGIOGRAPHY N/A 01/05/2017   Procedure: Right/Left Heart Cath and Coronary Angiography;  Surgeon: MBurnell Blanks MD;  [facility];  Service: Cardiovascular;  Laterality: N/A;     Current Outpatient Prescriptions  Medication Sig Dispense Refill  . aspirin 81 MG chewable tablet Place 1 tablet (81 mg total) into feeding tube daily. 30 tablet 0  . atorvastatin (LIPITOR) 20 MG tablet Take 20 mg by mouth daily.    . carvedilol (COREG) 6.25 MG tablet Place 1 tablet (6.25 mg total) into feeding tube 2 (two) times daily with a meal. 60 tablet 0  . ceFAZolin (ANCEF)  IVPB Inject 1 g into the vein every 8 (eight) hours. Indication:  MSSA bacteremia Last Day of Therapy:  6/16- 28 days from port a cath removal Labs - Once weekly:  CBC/D and BMP, Labs - Every other week:  ESR and CRP 84 Units 0  . fentaNYL (DURAGESIC - DOSED MCG/HR) 12 MCG/HR Place 1 patch every 3 days to the skin 10 patch 0  . furosemide (LASIX) 40 MG tablet Place 1 tablet (40 mg total) into feeding tube daily. 30 tablet 0  . isosorbide mononitrate (ISMO,MONOKET) 10 MG tablet take 1 tab (11m) per tube BID 60 tablet 0  . losartan (COZAAR) 25 MG tablet Place 1 tablet (25 mg total) into feeding tube daily. 30 tablet 0  . Nutritional Supplements (FEEDING  SUPPLEMENT, JEVITY 1.2 CAL,) LIQD Place 1,000 mLs into feeding tube continuous. 65cc/hr 1500 mL 30  . sacubitril-valsartan (ENTRESTO) 24-26 MG Take 1 tablet by mouth 2 (two) times daily.    .Marland Kitchenspironolactone (ALDACTONE) 25 MG tablet Place 1 tablet (25 mg total) into feeding tube daily. 30 tablet 0   No current facility-administered medications for this visit.    Facility-Administered Medications Ordered in Other Visits  Medication Dose Route Frequency Provider Last Rate Last Dose  . topical emolient (BIAFINE) emulsion   Topical Daily MKyung Rudd MD        Allergies:   Patient has no known allergies.    Social History:  The patient  reports that he quit smoking about 17 years ago. His smoking use included Cigarettes. He started smoking about 56 years ago. He has a 40.00 pack-year smoking history. He quit smokeless tobacco use about 17 years ago. His smokeless tobacco use included Chew. He reports that he does not drink alcohol or use drugs.   Family History:  The patient's family history includes Breast cancer in his mother.    ROS:  Please see the history of present illness.   Otherwise, review of systems are positive for none.   All other systems are reviewed and negative.    PHYSICAL EXAM: VS:  BP 104/69   Pulse 94   Ht '5\' 6"'  (1.676 m)   Wt 53 kg (116 lb 12.8 oz)   BMI 18.85 kg/m  , BMI Body mass index is 18.85 kg/m. GENERAL:  Frail, elderly man.  Well appearing HEENT:  Pupils equal round and reactive, fundi not visualized, oral mucosa unremarkable NECK:  No jugular venous distention, waveform within normal limits, carotid upstroke brisk and symmetric, no bruits, no thyromegaly LYMPHATICS:  No cervical adenopathy LUNGS:  Clear to auscultation bilaterally HEART:  RRR.  PMI not displaced or sustained,S1 and S2 within normal limits, no S3, no S4, no clicks, +apical rub, no murmurs ABD:  Flat, positive bowel sounds normal in frequency in pitch, no bruits, no rebound, no guarding, no  midline pulsatile mass, no hepatomegaly, no splenomegaly EXT:  2 plus pulses throughout, no edema, no cyanosis no clubbing SKIN:  No rashes no nodules NEURO:  Cranial nerves II through XII grossly intact, motor grossly intact throughout PSYCH:  Cognitively intact, oriented to person place and time    EKG:  EKG is not ordered today.  Echo 01/03/17: Study Conclusions  - Left ventricle: The cavity size was normal. Wall thickness was   normal. The estimated ejection fraction was in the range of 10%   to 15%. Diffuse hypokinesis. DIastolic function is abnormal,   indeterminant grade. - Aortic valve: There was moderate regurgitation. Valve area (VTI):  1.08 cm^2. Valve area (Vmax): 1.5 cm^2. Valve area (Vmean): 1.5   cm^2. - Mitral valve: There was moderate regurgitation. - Left atrium: The atrium was mildly dilated. - Right ventricle: Systolic function was mildly to moderately   reduced. - Tricuspid valve: There was moderate regurgitation. - Pulmonary arteries: Systolic pressure was moderately increased.   PA peak pressure: 49 mm Hg (S).  LHC 01/05/17:  Prox RCA to Mid RCA lesion, 99 %stenosed.  Mid RCA lesion, 30 %stenosed.  Dist RCA lesion, 40 %stenosed.  RPDA lesion, 20 %stenosed.  Prox Cx lesion, 100 %stenosed.  Ost Ramus to Ramus lesion, 50 %stenosed.  Ost 3rd Diag to 3rd Diag lesion, 30 %stenosed.  Mid LAD lesion, 40 %stenosed.  Ost LAD to Prox LAD lesion, 20 %stenosed.  Ost LM to LM lesion, 40 %stenosed.  Hemodynamic findings consistent with mild pulmonary hypertension.   1. Severe calcific disease in the proximal to mid RCA 2. 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 Moderate left main and mid LAD stenosis 4. Elevated filling pressures  Recent Labs: 03/05/2016: TSH 2.528 01/02/2017: ALT 25 01/08/2017: Magnesium 2.7 01/14/2017: B Natriuretic Peptide >4,500.0 01/15/2017: Hemoglobin 12.6; Platelets 316 01/17/2017: BUN 28; Creatinine, Ser 0.89;  Potassium 3.8; Sodium 128    Lipid Panel No results found for: CHOL, TRIG, HDL, CHOLHDL, VLDL, LDLCALC, LDLDIRECT    Wt Readings from Last 3 Encounters:  01/27/17 53 kg (116 lb 12.8 oz)  01/17/17 53.2 kg (117 lb 3.2 oz)  11/03/16 59.4 kg (131 lb)      ASSESSMENT AND PLAN:  # Chronic systolic and diastolic heart failure:  LVEF 10-15%.  He was successfully weaned from milrinone while in the hospital.  Collin Young is doing great.  He has no shortness of breath and is euvolemic.  We will switch losartan to Entresto 24/85m.  Continue carvedilol, Imdur, lasix and spironolactone.  He will need a repeat echo in 3 months.  He was hyponatremic in the hospital.  Repeat BMP and BNP today.    # Obstructive CAD: Medically managed.  100% LCx and 99% RCA.  The RCA would require hemodynamic support.  He is doing well at this time.  Consider PCI later if he becomes symptomatic or more stable.  Ideally this wouldn't occur until after he is off IV antibiotics. Continue aspirin, carvedilol, Imdur, and rosuvastatin.     Current medicines are reviewed at length with the patient today.  The patient does not have concerns regarding medicines.  The following changes have been made:  [facility] Labs/ tests ordered today include:  Orders Placed This Encounter  Procedures  . Basic metabolic panel  . Pro b natriuretic peptide (BNP)     Disposition:   FU with Lizeth Bencosme C. ROval Linsey MD, [facility]in 3 months.  APP in 1 month.    This note was written with the assistance of speech recognition software.  Please excuse any transcriptional errors.  Signed, Jalayne Ganesh C. ROval Linsey MD, FArapahoe Surgicenter LLC 01/27/2017 9:40 AM    [place]

## 2017-01-27 ENCOUNTER — Other Ambulatory Visit: Payer: Self-pay | Admitting: *Deleted

## 2017-01-27 ENCOUNTER — Ambulatory Visit (INDEPENDENT_AMBULATORY_CARE_PROVIDER_SITE_OTHER): Payer: Medicare Other | Admitting: Cardiovascular Disease

## 2017-01-27 ENCOUNTER — Encounter: Payer: Self-pay | Admitting: Cardiovascular Disease

## 2017-01-27 VITALS — BP 104/69 | HR 94 | Ht 66.0 in | Wt 116.8 lb

## 2017-01-27 DIAGNOSIS — I5042 Chronic combined systolic (congestive) and diastolic (congestive) heart failure: Secondary | ICD-10-CM

## 2017-01-27 DIAGNOSIS — R0602 Shortness of breath: Secondary | ICD-10-CM | POA: Diagnosis not present

## 2017-01-27 DIAGNOSIS — C12 Malignant neoplasm of pyriform sinus: Secondary | ICD-10-CM

## 2017-01-27 DIAGNOSIS — Z79899 Other long term (current) drug therapy: Secondary | ICD-10-CM

## 2017-01-27 DIAGNOSIS — E7800 Pure hypercholesterolemia, unspecified: Secondary | ICD-10-CM

## 2017-01-27 DIAGNOSIS — I2510 Atherosclerotic heart disease of native coronary artery without angina pectoris: Secondary | ICD-10-CM | POA: Diagnosis not present

## 2017-01-27 DIAGNOSIS — E039 Hypothyroidism, unspecified: Secondary | ICD-10-CM

## 2017-01-27 LAB — BASIC METABOLIC PANEL
BUN / CREAT RATIO: 18 (ref 10–24)
BUN: 19 mg/dL (ref 8–27)
CHLORIDE: 90 mmol/L — AB (ref 96–106)
CO2: 26 mmol/L (ref 18–29)
Calcium: 8.8 mg/dL (ref 8.6–10.2)
Creatinine, Ser: 1.04 mg/dL (ref 0.76–1.27)
GFR, EST AFRICAN AMERICAN: 86 mL/min/{1.73_m2} (ref >59)
GFR, EST NON AFRICAN AMERICAN: 74 mL/min/{1.73_m2} (ref >59)
Glucose: 91 mg/dL (ref 65–99)
POTASSIUM: 4.7 mmol/L (ref 3.5–5.2)
SODIUM: 129 mmol/L — AB (ref 134–144)

## 2017-01-27 LAB — PRO B NATRIURETIC PEPTIDE: NT-Pro BNP: 11301 pg/mL — ABNORMAL HIGH (ref 0–376)

## 2017-01-27 MED ORDER — FENTANYL 12 MCG/HR TD PT72: MEDICATED_PATCH | TRANSDERMAL | 0 refills | Status: DC

## 2017-01-27 NOTE — Patient Instructions (Addendum)
Medication Instructions:  START ENTRESTO 24-26 ONE TABLET TWICE A DAY   Labwork: BMET/BNP TODAY   Testing/Procedures: NONE  Follow-Up: Your physician recommends that you schedule a follow-up appointment in: [place]   Your physician recommends that you schedule a follow-up appointment in: [address] [facility]   If you need a refill on your cardiac medications before your next appointment, please call your pharmacy.

## 2017-01-29 MED FILL — fentaNYL 12 MCG/HR PT72: 12 | 30 days supply | Qty: 10 | Fill #0

## 2017-02-02 ENCOUNTER — Telehealth: Payer: Self-pay | Admitting: *Deleted

## 2017-02-02 MED ORDER — FUROSEMIDE 40 MG PO TABS: 40.0000 mg | ORAL_TABLET | Freq: Two times a day (BID) | ORAL | 5 refills | Status: DC

## 2017-02-02 NOTE — Telephone Encounter (Signed)
Advised daughter of labs and med change

## 2017-02-02 NOTE — Telephone Encounter (Signed)
-----   Message from Skeet Latch, MD sent at 02/01/2017  1:22 PM EDT ----- Kidney function is stable.  Heart failure labs are very elevated. Increase lasix to 40mg  bid.

## 2017-02-03 ENCOUNTER — Telehealth: Payer: Self-pay | Admitting: Cardiovascular Disease

## 2017-02-03 NOTE — Telephone Encounter (Signed)
New Message   pt daughter verbalized that she is calling for rn   She said that pt complains that he is experiencing burning when he urinates

## 2017-02-03 NOTE — Telephone Encounter (Signed)
Called patient (No DPR)-reports he experienced burning with urination x 3-4 times last night but is not experiencing this AM.  Denies fever, chills, pain with urination.  Advised to follow up with PCP.  Patient aware and verbalized understanding.

## 2017-02-10 MED FILL — SPIRONOLACTONE 25 MG TABLET: 25 | 90 days supply | Qty: 90 | Fill #0

## 2017-02-12 ENCOUNTER — Telehealth: Payer: Self-pay | Admitting: Cardiovascular Disease

## 2017-02-12 MED ORDER — SACUBITRIL-VALSARTAN 24-26 MG PO TABS: 1.0000 | ORAL_TABLET | Freq: Two times a day (BID) | ORAL | 10 refills | Status: DC

## 2017-02-12 NOTE — Telephone Encounter (Signed)
New message    *STAT* If patient is at the pharmacy, call can be transferred to refill team.   1. Which medications need to be refilled? (please list name of each medication and dose if known) ENTRESO  2. Which pharmacy/location (including street and city if local pharmacy) is medication to be sent to? [facility] on [address]  3. Do they need a 30 day or 90 day supply? 30 day

## 2017-02-12 NOTE — Telephone Encounter (Signed)
Medication sent to pharmacy, patient directly notified, and patient asked me to call daughter [name] and inform her of what I just told him. Daughter informed of Entresto sent to pharmacy;she voiced understanding.

## 2017-03-01 ENCOUNTER — Other Ambulatory Visit: Payer: Self-pay | Admitting: *Deleted

## 2017-03-01 ENCOUNTER — Encounter: Payer: Self-pay | Admitting: Physician Assistant

## 2017-03-01 ENCOUNTER — Ambulatory Visit (INDEPENDENT_AMBULATORY_CARE_PROVIDER_SITE_OTHER): Payer: Medicare Other | Admitting: Physician Assistant

## 2017-03-01 VITALS — BP 90/62 | HR 80 | Ht 64.0 in | Wt 112.0 lb

## 2017-03-01 DIAGNOSIS — I351 Nonrheumatic aortic (valve) insufficiency: Secondary | ICD-10-CM

## 2017-03-01 DIAGNOSIS — Z79899 Other long term (current) drug therapy: Secondary | ICD-10-CM | POA: Diagnosis not present

## 2017-03-01 DIAGNOSIS — C12 Malignant neoplasm of pyriform sinus: Secondary | ICD-10-CM

## 2017-03-01 DIAGNOSIS — I5042 Chronic combined systolic (congestive) and diastolic (congestive) heart failure: Secondary | ICD-10-CM | POA: Diagnosis not present

## 2017-03-01 DIAGNOSIS — I2510 Atherosclerotic heart disease of native coronary artery without angina pectoris: Secondary | ICD-10-CM | POA: Diagnosis not present

## 2017-03-01 DIAGNOSIS — I255 Ischemic cardiomyopathy: Secondary | ICD-10-CM

## 2017-03-01 DIAGNOSIS — I10 Essential (primary) hypertension: Secondary | ICD-10-CM | POA: Diagnosis not present

## 2017-03-01 DIAGNOSIS — E785 Hyperlipidemia, unspecified: Secondary | ICD-10-CM

## 2017-03-01 DIAGNOSIS — E039 Hypothyroidism, unspecified: Secondary | ICD-10-CM

## 2017-03-01 DIAGNOSIS — I952 Hypotension due to drugs: Secondary | ICD-10-CM | POA: Diagnosis not present

## 2017-03-01 LAB — BASIC METABOLIC PANEL
BUN / CREAT RATIO: 16 (ref 10–24)
BUN: 14 mg/dL (ref 8–27)
CHLORIDE: 89 mmol/L — AB (ref 96–106)
CO2: 25 mmol/L (ref 20–29)
Calcium: 9.4 mg/dL (ref 8.6–10.2)
Creatinine, Ser: 0.9 mg/dL (ref 0.76–1.27)
GFR calc non Af Amer: 89 mL/min/{1.73_m2} (ref >59)
GFR, EST AFRICAN AMERICAN: 103 mL/min/{1.73_m2} (ref >59)
Glucose: 92 mg/dL (ref 65–99)
POTASSIUM: 5 mmol/L (ref 3.5–5.2)
SODIUM: 130 mmol/L — AB (ref 134–144)

## 2017-03-01 MED ORDER — FENTANYL 12 MCG/HR TD PT72: MEDICATED_PATCH | TRANSDERMAL | 0 refills | Status: DC

## 2017-03-01 MED ORDER — SPIRONOLACTONE 25 MG PO TABS: 12.5000 mg | ORAL_TABLET | Freq: Every day | ORAL | 2 refills | Status: DC

## 2017-03-01 NOTE — Progress Notes (Signed)
Cardiology Office Note    Date:  03/01/2017   ID:  Collin Young, DOB 03/21/1951, MRN [id number]  PCP:  [name], MD  Cardiologist:  Dr. Oval Linsey  Chief Complaint  Patient presents with  . Follow-up    1 month no complaints.    History of Present Illness:  Collin Young is a 66 y.o. male with PMH of chronic combined systolic and diastolic HF, medically managed CAD, moderate AR, And squamous cell carcinoma of esophagus underwent chemotherapy and radiation therapy. He was admitted with acute dyspnea on 01/02/2017. BNP was greater than 2000. Echocardiogram revealed EF 10-15% with diffuse hypokinesis, moderate AR, PA SP 49 mmHg. He underwent left cardiac catheterization and found to have 100% proximal left circumflex, 99% proximal to mid RCA, otherwise mild to moderate CAD. There was consideration for RCA PCI, however this would have required hemodynamic support and was deferred given his frailty and the comorbidity. He was treated with milrinone and IV Lasix. Milrinone was weaned prior to discharge. During the hospitalization, he was also treated with MSSA bacteremia. He had tach placed due to frank aspiration. He received Ancef via PICC line for 28 days.  Current goal is to uptitrate heart failure medication. He was started on Entresto during the last office visit. He presents today for medication titration. However his blood pressure was 90/62. I decreased his spironolactone to 12.5 mg daily for now. Otherwise he is completely asymptomatic, he denies any recent lower extremity edema, orthopnea or PND. He denies any recent chest pain on the current dose of Imdur.    Past Medical History:  Diagnosis Date  . Cancer [facility])    Esophagus- radiation 2011  . GERD (gastroesophageal reflux disease)    takes zantac prn  . Headache   . History of radiation therapy 10/19/2013-12/05/2013   pyriform sinus 69.96 Gy/64fx  . HOH (hard of hearing)   . Hyperlipemia   . Hypertension   . Seizures  ([place])    alcohol withdrawls- 2001    Past Surgical History:  Procedure Laterality Date  . CARDIOVASCULAR STRESS TEST  10/09/09   normal nuclearr stress test, EF 57% [name])  . IR GASTROSTOMY TUBE MOD SED  01/13/2017  . IR REMOVAL TUN ACCESS W/ PORT W/O FL MOD SED  01/15/2017  . LARYNGOSCOPY N/A 09/15/2013   Procedure: LARYNGOSCOPY;  Surgeon: [name], MD;  Location: [facility];  Service: ENT;  Laterality: N/A;  direct laryngoscopy with biopsy and esophagoscopy  . NO PAST SURGERIES    . RIGHT/LEFT HEART CATH AND CORONARY ANGIOGRAPHY N/A 01/05/2017   Procedure: Right/Left Heart Cath and Coronary Angiography;  Surgeon: [name], MD;  Location: [facility];  Service: Cardiovascular;  Laterality: N/A;    Current Medications: Outpatient Medications Prior to Visit  Medication Sig Dispense Refill  . aspirin 81 MG chewable tablet Place 1 tablet (81 mg total) into feeding tube daily. 30 tablet 0  . atorvastatin (LIPITOR) 20 MG tablet Take 20 mg by mouth daily.    . carvedilol (COREG) 6.25 MG tablet Place 1 tablet (6.25 mg total) into feeding tube 2 (two) times daily with a meal. 60 tablet 0  . fentaNYL (DURAGESIC - DOSED MCG/HR) 12 MCG/HR Place 1 patch every 3 days to the skin 10 patch 0  . furosemide (LASIX) 40 MG tablet Place 1 tablet (40 mg total) into feeding tube 2 (two) times daily. 60 tablet 5  . isosorbide mononitrate (ISMO,MONOKET) 10 MG tablet take 1 tab (  10mg ) per tube BID 60 tablet 0  . losartan (COZAAR) 25 MG tablet Place 1 tablet (25 mg total) into feeding tube daily. 30 tablet 0  . Nutritional Supplements (FEEDING SUPPLEMENT, JEVITY 1.2 CAL,) LIQD Place 1,000 mLs into feeding tube continuous. 65cc/hr 1500 mL 30  . sacubitril-valsartan (ENTRESTO) 24-26 MG Take 1 tablet by mouth 2 (two) times daily. 60 tablet 10  . spironolactone (ALDACTONE) 25 MG tablet Place 1 tablet (25 mg total) into feeding tube daily. 30 tablet 0   Facility-Administered Medications Prior to  Visit  Medication Dose Route Frequency Provider Last Rate Last Dose  . topical emolient (BIAFINE) emulsion   Topical Daily Kyung Rudd, MD         Allergies:   Patient has no known allergies.   Social History   Social History  . Marital status: Legally Separated    Spouse name: N/A  . Number of children: N/A  . Years of education: N/A   Social History Main Topics  . Smoking status: Former Smoker    Packs/day: 1.00    Years: 40.00    Types: Cigarettes    Start date: 11/08/1960    Quit date: 09/01/1999  . Smokeless tobacco: Former Systems developer    Types: [place] date: 09/01/1999     Comment: quit in 2011  . Alcohol use No     Comment: quit in 2001  . Drug use: No     Comment: back in the day used cocaine,alcohol, marijuana  . Sexual activity: Not Asked   Other Topics Concern  . None   Social History Narrative  . None     Family History:  The patient's family history includes Breast cancer in his mother.   ROS:   Please see the history of present illness.    ROS All other systems reviewed and are negative.   PHYSICAL EXAM:   VS:  BP 90/62   Pulse 80   Ht 5\' 4"  (1.626 m)   Wt 112 lb (50.8 kg)   BMI 19.22 kg/m    GEN: Well nourished, well developed, in no acute distress  HEENT: normal  Neck: no JVD, carotid bruits, or masses Cardiac: RRR; no murmurs, rubs, or gallops,no edema  Respiratory:  clear to auscultation bilaterally, normal work of breathing GI: soft, nontender, nondistended, + BS MS: no deformity or atrophy  Skin: warm and dry, no rash Neuro:  Alert and Oriented x 3, Strength and sensation are intact Psych: euthymic mood, full affect  Wt Readings from Last 3 Encounters:  03/01/17 112 lb (50.8 kg)  01/27/17 116 lb 12.8 oz (53 kg)  01/17/17 117 lb 3.2 oz (53.2 kg)      Studies/Labs Reviewed:   EKG:  EKG is not ordered today.    Recent Labs: 03/05/2016: TSH 2.528 01/02/2017: ALT 25 01/08/2017: Magnesium 2.7 01/14/2017: B Natriuretic Peptide  >4,500.0 01/15/2017: Hemoglobin 12.6; Platelets 316 01/27/2017: BUN 19; Creatinine, Ser 1.04; NT-Pro BNP 11,301; Potassium 4.7; Sodium 129   Lipid Panel No results found for: CHOL, TRIG, HDL, CHOLHDL, VLDL, LDLCALC, LDLDIRECT  Additional studies/ records that were reviewed today include:   Echo 01/03/2017 LV EF: 10% -   15%  ------------------------------------------------------------------- Indications:      CHF - 428.0.  ------------------------------------------------------------------- History:   PMH:  GERD. Shortness of Breath.  PMH:  Esophageal Cancer.  Risk factors:  Hypertension. Diabetes mellitus. Dyslipidemia.  ------------------------------------------------------------------- Study Conclusions  - Left ventricle: The cavity size was normal. Wall thickness was  normal. The estimated ejection fraction was in the range of 10%   to 15%. Diffuse hypokinesis. DIastolic function is abnormal,   indeterminant grade. - Aortic valve: There was moderate regurgitation. Valve area (VTI):   1.08 cm^2. Valve area (Vmax): 1.5 cm^2. Valve area (Vmean): 1.5   cm^2. - Mitral valve: There was moderate regurgitation. - Left atrium: The atrium was mildly dilated. - Right ventricle: Systolic function was mildly to moderately   reduced. - Tricuspid valve: There was moderate regurgitation. - Pulmonary arteries: Systolic pressure was moderately increased.   PA peak pressure: 49 mm Hg (S).   Cath 01/05/2017 Conclusion     Prox RCA to Mid RCA lesion, 99 %stenosed.  Mid RCA lesion, 30 %stenosed.  Dist RCA lesion, 40 %stenosed.  RPDA lesion, 20 %stenosed.  Prox Cx lesion, 100 %stenosed.  Ost Ramus to Ramus lesion, 50 %stenosed.  Ost 3rd Diag to 3rd Diag lesion, 30 %stenosed.  Mid LAD lesion, 40 %stenosed.  Ost LAD to Prox LAD lesion, 20 %stenosed.  Ost LM to LM lesion, 40 %stenosed.  Hemodynamic findings consistent with mild pulmonary hypertension.   1. Severe calcific  disease in the proximal to mid RCA 2. 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 Moderate left main and mid LAD stenosis 4. Elevated filling pressures  Recommendations: He has multivessel CAD. I think the RCA can be approached with PCI but will likely require atherectomy prior to stenting. I would treat the remainder of his CAD medically. Will stage PCI given renal insufficiency. Hydrate gently tonight. May need diuresis tomorrow given elevated filling pressures. Would anticipate PCI on Thursday or Friday of this week if renal function remains stable.      ASSESSMENT:    1. Hypotension due to drugs   2. Encounter for long-term (current) use of medications   3. Chronic combined systolic and diastolic heart failure ([place])   4. Coronary artery disease involving native coronary artery of native heart without angina pectoris   5. Moderate aortic regurgitation   6. Ischemic cardiomyopathy   7. Essential hypertension   8. Hyperlipidemia, unspecified hyperlipidemia type      PLAN:  In order of problems listed above:  1. Hypotension: We'll decrease spironolactone 12.5 mg daily. Obtain basic metabolic panel after addition of Entresto.  2. Chronic combined systolic and diastolic heart failure: Euvolemic on physical exam today. We will discuss with Dr. Oval Linsey on the timing to obtain three-month echocardiogram. This is likely the maximum heart failure medication he is able to tolerate. Unable to titrate contrast. He is on carvedilol 6.25 mg twice a day, isosorbide mononitrate 10 mg daily, and Entrsto 24-26 mg daily, spironolactone.  3. Moderate aortic regurgitation: Stable  4. Hyperlipidemia: On Lipitor 20 mg daily.    Medication Adjustments/Labs and Tests Ordered: Current medicines are reviewed at length with the patient today.  Concerns regarding medicines are outlined above.  Medication changes, Labs and Tests ordered today are listed in the Patient Instructions  below. Patient Instructions  Medication Instructions:   REDUCE spironolactone dose to 12.5mg  DAILY.  Labwork:   BMET today  Testing/Procedures:  none  Follow-Up:  With Dr. Oval Linsey as scheduled  If you need a refill on your cardiac medications before your next appointment, please call your pharmacy.      Hilbert Corrigan, [place]  03/01/2017 1:13 PM    [facility] [place], [place], [place]  [address] Phone: [phone]; Fax: [phone]

## 2017-03-01 NOTE — Patient Instructions (Signed)
Medication Instructions:   REDUCE spironolactone dose to 12.5mg  DAILY.  Labwork:   BMET today  Testing/Procedures:  none  Follow-Up:  With Dr. Oval Linsey as scheduled  If you need a refill on your cardiac medications before your next appointment, please call your pharmacy.

## 2017-03-02 ENCOUNTER — Encounter: Payer: Self-pay | Admitting: Internal Medicine

## 2017-03-02 ENCOUNTER — Ambulatory Visit (INDEPENDENT_AMBULATORY_CARE_PROVIDER_SITE_OTHER): Payer: Medicare Other | Admitting: Internal Medicine

## 2017-03-02 VITALS — BP 105/64 | HR 84 | Temp 98.4°F | Wt 115.0 lb

## 2017-03-02 DIAGNOSIS — R7881 Bacteremia: Secondary | ICD-10-CM | POA: Diagnosis not present

## 2017-03-02 MED FILL — fentaNYL 12 MCG/HR PT72: 12 | 30 days supply | Qty: 10 | Fill #0

## 2017-03-02 NOTE — Progress Notes (Signed)
RFV: mrsa bacteremia associated with portacath  Patient ID: Collin Young, male   DOB: [birth year], 66 y.o.   MRN: [id number]  HPI Collin Young is a 66 y.o. male with  Hx of esophageal ca s/p chemo and radiation with portacath admitted for heart failure found to have 3V CAD requiring PCI but also found to have MSSA bacteremia and aspiration pneumonia. AFter undergoing work up for endocarditis, his bacteremia was thought to be related to his port a cath which was removed on 5/28. He was discharged on 4 wks of  IV cefazolin til 6/30.  Back to normal baseline health.  No diarrhea or thrush or gi upset related with abtx infusion  Outpatient Encounter Prescriptions as of 03/02/2017  Medication Sig  . aspirin 81 MG chewable tablet Place 1 tablet (81 mg total) into feeding tube daily.  Marland Kitchen atorvastatin (LIPITOR) 20 MG tablet Take 20 mg by mouth daily.  . carvedilol (COREG) 6.25 MG tablet Place 1 tablet (6.25 mg total) into feeding tube 2 (two) times daily with a meal.  . fentaNYL (DURAGESIC - DOSED MCG/HR) 12 MCG/HR Place 1 patch every 3 days to the skin  . furosemide (LASIX) 40 MG tablet Place 1 tablet (40 mg total) into feeding tube 2 (two) times daily.  . isosorbide mononitrate (ISMO,MONOKET) 10 MG tablet take 1 tab (10mg ) per tube BID  . losartan (COZAAR) 25 MG tablet Place 1 tablet (25 mg total) into feeding tube daily.  . Nutritional Supplements (FEEDING SUPPLEMENT, JEVITY 1.2 CAL,) LIQD Place 1,000 mLs into feeding tube continuous. 65cc/hr  . sacubitril-valsartan (ENTRESTO) 24-26 MG Take 1 tablet by mouth 2 (two) times daily.  Marland Kitchen spironolactone (ALDACTONE) 25 MG tablet Place 0.5 tablets (12.5 mg total) into feeding tube daily.   Facility-Administered Encounter Medications as of 03/02/2017  Medication  . topical emolient (BIAFINE) emulsion     Patient Active Problem List   Diagnosis Date Noted  . SOB (shortness of breath)   . Angina decubitus ([place])   . Coronary artery disease of  bypass graft of native heart with stable angina pectoris ([place])   . Aspiration pneumonia of both lower lobes due to gastric secretions ([place]) 01/11/2017  . Staphylococcus aureus bacteremia 01/11/2017  . Encounter for feeding tube placement   . Dysphagia   . Palliative care by specialist   . Acute respiratory failure with hypoxia ([place])   . Acute congestive heart failure ([place])   . Ischemic cardiomyopathy   . Acute pulmonary edema ([facility])   . Congestive dilated cardiomyopathy ([place])   . Acute combined systolic and diastolic heart failure ([place]) 01/02/2017  . AKI (acute kidney injury) ([place]) 01/02/2017  . Malignant neoplasm of pyriform sinus ([place]) 09/28/2013  . 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 NONSPECIFIC ABN FINDING RAD & OTH EXAM GI TRACT 05/14/2009     Health Maintenance Due  Topic Date Due  . Hepatitis C Screening  12-06-1950  . COLONOSCOPY  11/21/2000  . PNA vac Low Risk Adult (1 of 2 - PCV13) 11/22/2015     Review of Systems  Physical Exam   BP 105/64   Pulse 84   Temp 98.4 F (36.9 C) (Oral)   Wt 115 lb (52.2 kg)   BMI 19.74 kg/m  Physical Exam  Constitutional: He is oriented to person, place, and time. He appears well-developed and well-nourished. No distress.  HENT:  Mouth/Throat: Oropharynx is clear and moist. No oropharyngeal exudate.  Cardiovascular: Normal rate, regular rhythm and normal heart sounds.  Exam reveals no gallop and no friction rub.  No murmur heard.  Pulmonary/Chest: Effort normal and breath sounds normal. No respiratory distress. He has no wheezes. Chest wall : well healed  Ext: right picc line is c/d/i Psychiatric: He has a normal mood and affect. His behavior is normal.    CBC Lab Results  Component Value Date   WBC 13.8 (H) 01/15/2017   RBC 4.60 01/15/2017   HGB 12.6 (L) 01/15/2017   HCT 38.9 (L) 01/15/2017   PLT 316 01/15/2017   MCV 84.6 01/15/2017   MCH 27.4 01/15/2017   MCHC 32.4 01/15/2017   RDW 14.3 01/15/2017   LYMPHSABS 0.6 (L)  01/02/2017   MONOABS 0.3 01/02/2017   EOSABS 0.0 01/02/2017    BMET Lab Results  Component Value Date   NA 130 (L) 03/01/2017   K 5.0 03/01/2017   CL 89 (L) 03/01/2017   CO2 25 03/01/2017   GLUCOSE 92 03/01/2017   BUN 14 03/01/2017   CREATININE 0.90 03/01/2017   CALCIUM 9.4 03/01/2017   GFRNONAA 89 03/01/2017   GFRAA 103 03/01/2017    Assessment and Plan  Complicated mssa bacteremia = he has completed course of treatment and appears back to his baseline  No need for further abtx. Will discontinue picc line

## 2017-03-02 NOTE — Progress Notes (Signed)
Renal function and electrolyte stable.

## 2017-03-04 ENCOUNTER — Emergency Department (HOSPITAL_COMMUNITY)
Admission: EM | Admit: 2017-03-04 | Discharge: 2017-03-04 | Disposition: A | Discharge status: Home / Self Care | Payer: Medicare Other | Attending: Emergency Medicine | Admitting: Emergency Medicine

## 2017-03-04 DIAGNOSIS — T85598A Other mechanical complication of other gastrointestinal prosthetic devices, implants and grafts, initial encounter: Secondary | ICD-10-CM | POA: Diagnosis not present

## 2017-03-04 DIAGNOSIS — Y69 Unspecified misadventure during surgical and medical care: Secondary | ICD-10-CM | POA: Insufficient documentation

## 2017-03-04 DIAGNOSIS — Z5321 Procedure and treatment not carried out due to patient leaving prior to being seen by health care provider: Secondary | ICD-10-CM | POA: Insufficient documentation

## 2017-03-09 ENCOUNTER — Other Ambulatory Visit: Payer: Self-pay

## 2017-03-09 ENCOUNTER — Ambulatory Visit: Payer: Self-pay | Admitting: Hematology & Oncology

## 2017-03-09 ENCOUNTER — Other Ambulatory Visit: Payer: Self-pay | Admitting: *Deleted

## 2017-03-09 DIAGNOSIS — C12 Malignant neoplasm of pyriform sinus: Secondary | ICD-10-CM

## 2017-03-10 ENCOUNTER — Ambulatory Visit (HOSPITAL_BASED_OUTPATIENT_CLINIC_OR_DEPARTMENT_OTHER): Payer: Medicare Other | Admitting: Hematology & Oncology

## 2017-03-10 ENCOUNTER — Other Ambulatory Visit (HOSPITAL_BASED_OUTPATIENT_CLINIC_OR_DEPARTMENT_OTHER): Payer: Medicare Other

## 2017-03-10 VITALS — BP 108/57 | HR 86 | Temp 98.2°F | Resp 16 | Wt 114.0 lb

## 2017-03-10 DIAGNOSIS — I509 Heart failure, unspecified: Secondary | ICD-10-CM

## 2017-03-10 DIAGNOSIS — C12 Malignant neoplasm of pyriform sinus: Secondary | ICD-10-CM

## 2017-03-10 DIAGNOSIS — N179 Acute kidney failure, unspecified: Secondary | ICD-10-CM

## 2017-03-10 DIAGNOSIS — Z8501 Personal history of malignant neoplasm of esophagus: Secondary | ICD-10-CM | POA: Diagnosis not present

## 2017-03-10 DIAGNOSIS — I5041 Acute combined systolic (congestive) and diastolic (congestive) heart failure: Secondary | ICD-10-CM

## 2017-03-10 LAB — COMPREHENSIVE METABOLIC PANEL
ALT: 18 U/L (ref 0–55)
ANION GAP: 10 meq/L (ref 3–11)
AST: 23 U/L (ref 5–34)
Albumin: 3.5 g/dL (ref 3.5–5.0)
Alkaline Phosphatase: 51 U/L (ref 40–150)
BUN: 14.7 mg/dL (ref 7.0–26.0)
CALCIUM: 10 mg/dL (ref 8.4–10.4)
CHLORIDE: 98 meq/L (ref 98–109)
CO2: 29 meq/L (ref 22–29)
Creatinine: 1.1 mg/dL (ref 0.7–1.3)
EGFR: 83 mL/min/{1.73_m2} — AB (ref >90)
Glucose: 120 mg/dl (ref 70–140)
POTASSIUM: 4.3 meq/L (ref 3.5–5.1)
Sodium: 137 mEq/L (ref 136–145)
Total Bilirubin: 0.41 mg/dL (ref 0.20–1.20)
Total Protein: 7.9 g/dL (ref 6.4–8.3)

## 2017-03-10 LAB — CBC WITH DIFFERENTIAL (CANCER CENTER ONLY)
BASO#: 0 10*3/uL (ref 0.0–0.2)
BASO%: 0.3 % (ref 0.0–2.0)
EOS%: 2.6 % (ref 0.0–7.0)
Eosinophils Absolute: 0.1 10*3/uL (ref 0.0–0.5)
HEMATOCRIT: 38 % — AB (ref 38.7–49.9)
HGB: 12.4 g/dL — ABNORMAL LOW (ref 13.0–17.1)
LYMPH#: 0.9 10*3/uL (ref 0.9–3.3)
LYMPH%: 25.3 % (ref 14.0–48.0)
MCH: 28.5 pg (ref 28.0–33.4)
MCHC: 32.6 g/dL (ref 32.0–35.9)
MCV: 87 fL (ref 82–98)
MONO#: 0.6 10*3/uL (ref 0.1–0.9)
MONO%: 15.6 % — ABNORMAL HIGH (ref 0.0–13.0)
NEUT#: 2 10*3/uL (ref 1.5–6.5)
NEUT%: 56.2 % (ref 40.0–80.0)
PLATELETS: 258 10*3/uL (ref 145–400)
RBC: 4.35 10*6/uL (ref 4.20–5.70)
RDW: 15.1 % (ref 11.1–15.7)
WBC: 3.5 10*3/uL — ABNORMAL LOW (ref 4.0–10.0)

## 2017-03-10 LAB — TSH: TSH: 2.085 m[IU]/L (ref 0.320–4.118)

## 2017-03-10 NOTE — Progress Notes (Signed)
Hematology and Oncology Follow Up Visit  Collin Young [id number] 1950/09/09 66 y.o. 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  Young is back for followup. I am absolutely stunned as to what has happened. He is hospitalized for 2 weeks with profound congestive heart failure. He underwent cardiac cath which showed marked lockage is of most of his arteries. He has been treated medically. He did not have any stents placed.. Overall, his echocardiogram showed an ejection fraction of 10-15%. He had moderate aortic and mitral valve regurgitation. He had tricuspid valve regurgitation.  He has Port-A-Cath removed. Had a feeding tube placed. I'm just amazed as to what is happened.   He was placed on IV antibiotics for 4 weeks. He was on Ancef for MSSA. He has been seen infectious disease as an outpatient. He had a PICC line for this. This is been removed.  He had a renal ultrasound. He had no obvious renal lesions.   He has lost 10 pounds since her last saw him.  He says that he is trying to use the feeding to 6 times a day. He is still swallowing some food on his own.   He has had no fever. He's had no bleeding. He's really had no shortness of breath. He never has had problems swallowing. I realize that he did have a PEG tube in place while he was getting treatment for his esophageal cancer area and this was about 8 years ago.  Of note, his cardiac cath showed 99% stenosis of the RCA. He had 100% stenosis of the circumflex. His left main was 40%. His LAD was 40%.  He has no problems with pain. He is on a fentanyl patch.  Overall, I was his performance status is ECOG 2.  Medications:  Current Outpatient Prescriptions:  .  aspirin 81 MG chewable tablet, Place 1 tablet (81 mg  total) into feeding tube daily., Disp: 30 tablet, Rfl: 0 .  atorvastatin (LIPITOR) 20 MG tablet, Take 20 mg by mouth daily., Disp: , Rfl:  .  carvedilol (COREG) 6.25 MG tablet, Place 1 tablet (6.25 mg total) into feeding tube 2 (two) times daily with a meal., Disp: 60 tablet, Rfl: 0 .  fentaNYL (DURAGESIC - DOSED MCG/HR) 12 MCG/HR, Place 1 patch every 3 days to the skin, Disp: 10 patch, Rfl: 0 .  furosemide (LASIX) 40 MG tablet, Place 1 tablet (40 mg total) into feeding tube 2 (two) times daily., Disp: 60 tablet, Rfl: 5 .  isosorbide mononitrate (ISMO,MONOKET) 10 MG tablet, take 1 tab (10mg ) per tube BID, Disp: 60 tablet, Rfl: 0 .  losartan (COZAAR) 25 MG tablet, Place 1 tablet (25 mg total) into feeding tube daily., Disp: 30 tablet, Rfl: 0 .  Nutritional Supplements (FEEDING SUPPLEMENT, JEVITY 1.2 CAL,) LIQD, Place 1,000 mLs into feeding tube continuous. 65cc/hr, Disp: 1500 mL, Rfl: 30 .  sacubitril-valsartan (ENTRESTO) 24-26 MG, Take 1 tablet by mouth 2 (two) times daily., Disp: 60 tablet, Rfl: 10 .  spironolactone (ALDACTONE) 25 MG tablet, Place 0.5 tablets (12.5 mg total) into feeding tube daily., Disp: 30 tablet, Rfl: 2 No current facility-administered medications for this visit.   Facility-Administered Medications Ordered in Other Visits:  .  topical emolient (BIAFINE) emulsion, , Topical, Daily, Kyung Rudd, MD  Allergies: No  Known Allergies  Past Medical History, Surgical history, Social history, and Family History were reviewed and updated.  Review of Systems: As above  Physical Exam:  weight is 114 lb (51.7 kg). His oral temperature is 98.2 F (36.8 C). His blood pressure is 108/57 (abnormal) and his pulse is 86. His respiration is 16 and oxygen saturation is 100%.   Thin, African American gentleman. Head and neck exam shows no ocular or oral lesions. There is no adenopathy in the neck.  thyroid is nonpalpable. Lungs are clear. Cardiac exam regular rate and rhythm. There is no  murmurs, rubs or bruits.. Abdomen  is soft. He has good bowel sounds. The feeding tube site  is intact. There is no erythema or swelling about the feeding tube. Back exam shows no tenderness over the spine, ribs or hips. Extremities shows no clubbing, cyanosis or edema. He does have some symmetric muscle atrophy in the upper and lower extremities. He has good strength in his legs. Skin exam shows  no rashes, ecchymoses or petechia. Neurological exam is nonfocal.  Lab Results  Component Value Date   WBC 3.5 (L) 03/10/2017   HGB 12.4 (L) 03/10/2017   HCT 38.0 (L) 03/10/2017   MCV 87 03/10/2017   PLT 258 03/10/2017     Chemistry      Component Value Date/Time   NA 130 (L) 03/01/2017 1119   NA 136 03/05/2016 1334   K 5.0 03/01/2017 1119   K 4.1 03/05/2016 1334   CL 89 (L) 03/01/2017 1119   CL 102 11/13/2014 1012   CO2 25 03/01/2017 1119   CO2 27 03/05/2016 1334   BUN 14 03/01/2017 1119   BUN 19.3 03/05/2016 1334   CREATININE 0.90 03/01/2017 1119   CREATININE 1.2 03/05/2016 1334      Component Value Date/Time   CALCIUM 9.4 03/01/2017 1119   CALCIUM 9.4 03/05/2016 1334   ALKPHOS 37 (L) 01/02/2017 0658   ALKPHOS 42 03/05/2016 1334   AST 39 01/02/2017 0658   AST 26 03/05/2016 1334   ALT 25 01/02/2017 0658   ALT 20 03/05/2016 1334   BILITOT 0.4 01/02/2017 0658   BILITOT <0.30 03/05/2016 1334         Impression and Plan: Collin Young is 66 year old gentleman. He had a localized recurrence of carcinoma of the left pyriform sinus. He underwent concurrent chemotherapy and radiation therapy. He finished his treatment back in April of 2015.   Has a history of squamous oh carcinoma of the esophagus. He was treated with radiation chemotherapy. This was completed back in 2010.  I just feel bad that he has had these recent issues. Of course, we have to worry about the possibility that the radiation that he had to the esophageal cancer may have been a factor. However, I think that with  these new or radiation techniques, that the heart is protected as much as possible. I'm sure, however, that there is still some radiation of the heart receives.  Hopefully, his cardiac function will improve. I'm sure that he will have another echocardiogram when he sees his cardiologist.  At some point, I suspect he might need another swallowing study. I doubt that he sees anybody for this. We will probably have to work on this ourselves.  I spent about 40 minutes with he and his daughter. I was not aware that all of this had happened. This was incredibly complicated.   [name], MD 7/11/201810:43 AM

## 2017-03-16 ENCOUNTER — Encounter (HOSPITAL_COMMUNITY): Payer: Self-pay | Admitting: Dentistry

## 2017-03-17 ENCOUNTER — Encounter (HOSPITAL_COMMUNITY): Payer: Self-pay | Admitting: Dentistry

## 2017-03-22 ENCOUNTER — Ambulatory Visit: Payer: Medicare Other | Admitting: Speech Pathology

## 2017-03-22 ENCOUNTER — Telehealth: Payer: Self-pay | Admitting: Speech Pathology

## 2017-03-22 ENCOUNTER — Ambulatory Visit: Payer: Medicare Other | Attending: Internal Medicine | Admitting: Speech Pathology

## 2017-03-22 DIAGNOSIS — R1312 Dysphagia, oropharyngeal phase: Secondary | ICD-10-CM | POA: Insufficient documentation

## 2017-03-22 NOTE — Telephone Encounter (Signed)
Requested order from PCP for Modified Barium Swallow.  Deneise Lever, MS, Actor

## 2017-03-22 NOTE — Therapy (Signed)
[facility] [address] [place], [place], [address] Phone: [phone]   Fax:  [phone]  Speech Language Pathology Evaluation  Patient Details  Name: Collin Young MRN: [id number] Date of Birth: 1951-06-22 Referring Provider: [name], MD  Encounter Date: 03/22/2017      End of Session - 03/22/17 0846    Visit Number 1   Number of Visits 9   Date for SLP Re-Evaluation 05/21/17   SLP Start Time 0801   SLP Stop Time  0840   SLP Time Calculation (min) 39 min   Activity Tolerance Patient tolerated treatment well      Past Medical History:  Diagnosis Date  . Cancer [facility])    Esophagus- radiation 2011  . GERD (gastroesophageal reflux disease)    takes zantac prn  . Headache   . History of radiation therapy 10/19/2013-12/05/2013   pyriform sinus 69.96 Gy/86fx  . HOH (hard of hearing)   . Hyperlipemia   . Hypertension   . Seizures ([place])    alcohol withdrawls- 2001    Past Surgical History:  Procedure Laterality Date  . CARDIOVASCULAR STRESS TEST  10/09/09   normal nuclearr stress test, EF 57% [name])  . IR GASTROSTOMY TUBE MOD SED  01/13/2017  . IR REMOVAL TUN ACCESS W/ PORT W/O FL MOD SED  01/15/2017  . LARYNGOSCOPY N/A 09/15/2013   Procedure: LARYNGOSCOPY;  Surgeon: [name], MD;  Location: [facility];  Service: ENT;  Laterality: N/A;  direct laryngoscopy with biopsy and esophagoscopy  . NO PAST SURGERIES    . RIGHT/LEFT HEART CATH AND CORONARY ANGIOGRAPHY N/A 01/05/2017   Procedure: Right/Left Heart Cath and Coronary Angiography;  Surgeon: [name], MD;  Location: [facility];  Service: Cardiovascular;  Laterality: N/A;    There were no vitals filed for this visit.      Subjective Assessment - 03/22/17 0806    Subjective "Check my swallowing, see how my swallowing is doing."   Currently in Pain? Yes   Pain Score 9    Pain Location Other (Comment)  PEG site   Pain  Descriptors / Indicators Aching;Sore   Pain Type Acute pain   Pain Onset 1 to 4 weeks ago   Pain Frequency Intermittent   Effect of Pain on Daily Activities plans to f/u with MD later today            SLP Evaluation [name] - 03/22/17 8185      SLP Visit Information   SLP Received On 03/22/17   Referring Provider [name], MD   Onset Date 01/08/17   Medical Diagnosis dysphagia     Subjective   Patient/Family Stated Goal stop using feeding tube     General Information   HPI 66 year old male with PMH of GERD, 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 Recent hospitalization ; patient underwent right heart cath on 5/18, had aspiration pneumonia during admission. Pt was seen in OP SLP tx over 11 months, 4 visits. Pt reported that swallowing issues had resolved, but was noncompliant with exercise program. Pt had G tube during RXT, it was removed in 2015. In early 2016 SLP reported consistent throat clearing/cough observed with thin liquids, small sip chin tuck and effortful swallow x2 eliminated cough. MBS performed 01/08/17 during hospitalization revealed severe to profound oropharyngeal dysphagia secondary to fibrotic  musculature following RXT in 2015 without compliance to recommendations for exercise program. Pt with severe, silent aspiration despite positional compensations. PEG placed during hospital stay.   Behavioral/Cognition alert, cooperative, hard of hearing   Mobility Status ambulated to session     Oral Motor/Sensory Function   Overall Oral Motor/Sensory Function Impaired   Labial ROM Within Functional Limits   Labial Symmetry Within Functional Limits   Labial Strength Within Functional Limits   Labial Coordination WFL   Lingual Symmetry Within Functional Limits   Lingual Strength Within Functional Limits   Lingual Coordination WFL    Facial ROM Within Functional Limits   Facial Symmetry Within Functional Limits   Facial Strength Within Functional Limits   Facial Coordination WFL   Velum Within Functional Limits   Mandible Other (Comment)  ROM reduced   Overall Oral Motor/Sensory Function minimal hyolarygneal movement to palpation of swallow.     Motor Speech   Overall Motor Speech Impaired   Respiration Within functional limits   Phonation Hoarse;Aphonic   Resonance Within functional limits   Articulation Within functional limitis   Intelligibility Intelligibility reduced   Conversation 75-100% accurate   Motor Planning Witnin functional limits   Motor Speech Errors Not applicable                         SLP Education - 03/22/17 1247    Education provided Yes   Education Details aspiration risks and precautions, swallowing exercises, prognosis, free water protocol   Person(s) Educated Patient;Child(ren)   Methods Explanation;Verbal cues;Handout   Comprehension Verbalized understanding;Need further instruction            SLP Long Term Goals - 03/22/17 0844      SLP LONG TERM GOAL #1   Title pt will demo HEP with modified independence   Baseline max A   Time 4   Period Weeks   Status New     SLP LONG TERM GOAL #2   Title Pt will verbalize precautions for free water protocol with min A   Time 4   Period Weeks   Status New     SLP LONG TERM GOAL #3   Title pt will tell SLP 3 signs of aspiration PNA with modified independence   Time 4   Period Weeks   Status New          Plan - 03/22/17 0845    Clinical Impression Statement Patient presents with severe risk for aspiration in the setting of fibrotic musculature following XRT in 2015 without compliance to swallowing exercises. He presents with overt signs of aspiration today with ice chips, thin liquids, including immediate throat clearing, wet vocal quality and delayed coughing, suggestive of reduced airway protection.  Patient reports he has not been following swallowing precautions or exercises at home; he has been consuming PO for approximately 1 month, including drinking his tube feeds per his report. Jaw ROM noted to be mildly reduced at 35 mm using OraStretch scale (above 40 mm is normal).  Given that his recent MBS showed significant, silent aspiration of multiple consistencies despite attempts for positional compensations and pt's recent pneumonia, recommend pt continue all nutrition via PEG vs orally. Pt may have ice chips, thin water only within 30 minutes of thorough oral care. Educated pt and his daughter extensively re: the severity of his swallowing impairment and that spontaneous recovery is improbable given his current function and fibrotic musculature. Explained that pt must complete exercises regularly  if he hopes to regain swallowing function. Extensive education re: risks of aspiration, signs of pneumonia, free water protocol, and trained pt in swallowing exercises to improve fx. Patient states he is willing and motivated to complete exercise program. Recommend 4 week trial period of ST for continued training in exercises, risks of aspiration, and repeat MBS if pt is able to complete exercises as recommended.    Speech Therapy Frequency 2x / week   Duration 4 weeks   Treatment/Interventions Aspiration precaution training;SLP instruction and feedback;Cueing hierarchy;Patient/family education;Diet toleration management by SLP;Trials of upgraded texture/liquids   Potential to Achieve Goals Fair   Potential Considerations Previous level of function;Severity of impairments;Cooperation/participation level;Medical prognosis   SLP Home Exercise Plan reviewed and provided   Consulted and Agree with Plan of Care Patient;Family member/caregiver      Patient will benefit from skilled therapeutic intervention in order to improve the following deficits and impairments:   Dysphagia, oropharyngeal  phase    Problem List Patient Active Problem List   Diagnosis Date Noted  . SOB (shortness of breath)   . Angina decubitus ([place])   . Coronary artery disease of bypass graft of native heart with stable angina pectoris ([place])   . Aspiration pneumonia of both lower lobes due to gastric secretions ([place]) 01/11/2017  . Staphylococcus aureus bacteremia 01/11/2017  . Encounter for feeding tube placement   . Dysphagia   . Palliative care by specialist   . Acute respiratory failure with hypoxia ([place])   . Acute congestive heart failure ([place])   . Ischemic cardiomyopathy   . Acute pulmonary edema ([facility])   . Congestive dilated cardiomyopathy ([place])   . Acute combined systolic and diastolic heart failure ([place]) 01/02/2017  . AKI (acute kidney injury) ([place]) 01/02/2017  . Malignant neoplasm of pyriform sinus ([place]) 09/28/2013  . 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 NONSPECIFIC ABN FINDING RAD & OTH EXAM GI TRACT 05/14/2009   Deneise Lever, [place], [place] Speech-Language Pathologist   Aliene Altes 03/22/2017, 12:53 PM  [facility] [address] [place], [place], [address] Phone: [phone]   Fax:  [phone]  Name: SAAJAN WILLMON MRN: [id number] Date of Birth: April 05, 1951

## 2017-03-22 NOTE — Patient Instructions (Signed)
Complete exercises on your handout twice a day, 20 repetitions each exercise  -Jaw stretch -Effortful swallow -Shaker exercise -Chin tuck with ball  Bring your ball and your breathing device to the next session.

## 2017-03-24 ENCOUNTER — Other Ambulatory Visit (HOSPITAL_COMMUNITY): Payer: Self-pay | Admitting: Internal Medicine

## 2017-03-24 DIAGNOSIS — R1310 Dysphagia, unspecified: Secondary | ICD-10-CM

## 2017-03-25 ENCOUNTER — Other Ambulatory Visit (HOSPITAL_COMMUNITY): Payer: Self-pay | Admitting: Internal Medicine

## 2017-03-25 ENCOUNTER — Encounter (HOSPITAL_COMMUNITY): Payer: Self-pay

## 2017-03-25 ENCOUNTER — Ambulatory Visit (HOSPITAL_COMMUNITY)
Admission: RE | Admit: 2017-03-25 | Discharge: 2017-03-25 | Disposition: A | Discharge status: Home / Self Care | Payer: Medicare Other | Source: Ambulatory Visit | Attending: Internal Medicine | Admitting: Internal Medicine

## 2017-03-25 DIAGNOSIS — R1310 Dysphagia, unspecified: Secondary | ICD-10-CM

## 2017-03-25 HISTORY — PX: IR PATIENT EVAL TECH 0-60 MINS: IMG5564

## 2017-03-25 MED ORDER — IOPAMIDOL (ISOVUE-300) INJECTION 61%
INTRAVENOUS | Status: AC
  Filled 2017-03-25: qty 50

## 2017-03-25 NOTE — Procedures (Signed)
Dr Pascal Lux looked at skin site and documented granular tissue and then educated patient and family.

## 2017-03-29 ENCOUNTER — Ambulatory Visit: Payer: Medicare Other | Admitting: Speech Pathology

## 2017-03-29 DIAGNOSIS — R1312 Dysphagia, oropharyngeal phase: Secondary | ICD-10-CM | POA: Diagnosis not present

## 2017-03-29 NOTE — Therapy (Signed)
[facility] [address] [place], [place], [address] Phone: [phone]   Fax:  [phone]  Speech Language Pathology Treatment  Patient Details  Name: Collin Young MRN: [id number] Date of Birth: Aug 09, 1951 Referring Provider: [name], MD  Encounter Date: 03/29/2017      End of Session - 03/29/17 1827    Visit Number 2   Number of Visits 9   Date for SLP Re-Evaluation 05/21/17   SLP Start Time 7121   SLP Stop Time  1625   SLP Time Calculation (min) 54 min   Activity Tolerance Patient tolerated treatment well      Past Medical History:  Diagnosis Date  . Cancer [facility])    Esophagus- radiation 2011  . GERD (gastroesophageal reflux disease)    takes zantac prn  . Headache   . History of radiation therapy 10/19/2013-12/05/2013   pyriform sinus 69.96 Gy/77fx  . HOH (hard of hearing)   . Hyperlipemia   . Hypertension   . Seizures ([place])    alcohol withdrawls- 2001    Past Surgical History:  Procedure Laterality Date  . CARDIOVASCULAR STRESS TEST  10/09/09   normal nuclearr stress test, EF 57% [name])  . IR GASTROSTOMY TUBE MOD SED  01/13/2017  . IR PATIENT EVAL TECH 0-60 MINS  03/25/2017  . IR REMOVAL TUN ACCESS W/ PORT W/O FL MOD SED  01/15/2017  . LARYNGOSCOPY N/A 09/15/2013   Procedure: LARYNGOSCOPY;  Surgeon: [name], MD;  Location: [facility];  Service: ENT;  Laterality: N/A;  direct laryngoscopy with biopsy and esophagoscopy  . NO PAST SURGERIES    . RIGHT/LEFT HEART CATH AND CORONARY ANGIOGRAPHY N/A 01/05/2017   Procedure: Right/Left Heart Cath and Coronary Angiography;  Surgeon: [name], MD;  Location: [facility];  Service: Cardiovascular;  Laterality: N/A;    There were no vitals filed for this visit.      Subjective Assessment - 03/29/17 1756    Subjective "I been eating table food."   Currently in Pain? No/denies               ADULT SLP TREATMENT -  03/29/17 1445      General Information   Behavior/Cognition Alert;Cooperative   Patient Positioning Upright in chair   Oral care provided N/A     Treatment Provided   Treatment provided Dysphagia     Dysphagia Treatment   Temperature Spikes Noted No   Respiratory Status Room air   Oral Cavity - Dentition Dentures, top;Dentures, bottom   Treatment Methods Therapeutic exercise;Patient/caregiver education   Patient observed directly with PO's No   Other treatment/comments Pt describes eating "table food" and "swallowing it without any problems." SLP provided education and reviewed images of pt's MBSS with him to demonstrate that he is almost certainly aspirating consistently even if he doesn't sense it. Provided education re: free water protocol, explained risks of aspiration and signs of aspiration pneumonia. Recommended pt monitor temperature daily if he continues to eat by mouth. Pt demo'd HEP (Shaker, CTAR, effortful swallow, jaw ROM) and SLP provided cues and instruction to improve technique. SLP performed gentle laryngeal massage, manual therapy; pt with minimal movement of hyolaryngeal complex 2/2 fibrosis.     Pain Assessment   Pain Assessment No/denies pain     Assessment / Recommendations / Plan   Plan Continue with current plan of care     Dysphagia Recommendations   Diet recommendations Other(comment);NPO  free water protocol  Liquids provided via Cup;Straw   Medication Administration Via alternative means     Progression Toward Goals   Progression toward goals Progressing toward goals          SLP Education - 03/29/17 1826    Education provided Yes   Education Details aspiration risks and precautions, MBS showing silent aspiration, free water protocol, swallowing exercises   Person(s) Educated Patient;Child(ren)   Methods Explanation;Verbal cues;Tactile cues;Demonstration;Other (comment)  MBS video   Comprehension Verbalized understanding;Returned  demonstration;Need further instruction            SLP Long Term Goals - 03/29/17 1830      SLP LONG TERM GOAL #1   Title pt will demo HEP with modified independence   Period Weeks   Status On-going     SLP LONG TERM GOAL #2   Title Pt will verbalize precautions for free water protocol with min A   Time 3   Period Weeks   Status On-going     SLP LONG TERM GOAL #3   Title pt will tell SLP 3 signs of aspiration PNA with modified independence   Time 3   Period Weeks   Status On-going          Plan - 03/29/17 1828    Clinical Impression Statement Patient presents with severe risk for aspiration in the setting of fibrotic musculature following XRT in 2015 without compliance to swallowing exercises. Given that his recent MBS showed significant, silent aspiration of multiple consistencies despite attempts for positional compensations and pt's recent pneumonia, recommend pt continue all nutrition via PEG vs orally. Extensive education re: risks of aspiration, signs of pneumonia, free water protocol, and trained pt in swallowing exercises to improve fx. Patient appears motivated and reports completing exercises daily. Recommend skilled ST for continued training in exercises, risks of aspiration, and repeat MBS if pt is able to complete exercises as recommended.    Speech Therapy Frequency 2x / week   Duration 4 weeks   Treatment/Interventions Aspiration precaution training;SLP instruction and feedback;Cueing hierarchy;Patient/family education;Diet toleration management by SLP;Trials of upgraded texture/liquids   Potential to Achieve Goals Fair   Potential Considerations Previous level of function;Severity of impairments;Cooperation/participation level;Medical prognosis   SLP Home Exercise Plan reviewed and provided   Consulted and Agree with Plan of Care Patient;Family member/caregiver      Patient will benefit from skilled therapeutic intervention in order to improve the following  deficits and impairments:   Dysphagia, oropharyngeal phase    Problem List Patient Active Problem List   Diagnosis Date Noted  . SOB (shortness of breath)   . Angina decubitus ([place])   . Coronary artery disease of bypass graft of native heart with stable angina pectoris ([place])   . Aspiration pneumonia of both lower lobes due to gastric secretions ([place]) 01/11/2017  . Staphylococcus aureus bacteremia 01/11/2017  . Encounter for feeding tube placement   . Dysphagia   . Palliative care by specialist   . Acute respiratory failure with hypoxia ([place])   . Acute congestive heart failure ([place])   . Ischemic cardiomyopathy   . Acute pulmonary edema ([facility])   . Congestive dilated cardiomyopathy ([place])   . Acute combined systolic and diastolic heart failure ([place]) 01/02/2017  . AKI (acute kidney injury) ([place]) 01/02/2017  . Malignant neoplasm of pyriform sinus ([place]) 09/28/2013  . 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 NONSPECIFIC ABN FINDING RAD & OTH EXAM GI TRACT 05/14/2009   Deneise Lever, [place], [place] Speech-Language Pathologist  Aliene Altes 03/29/2017, 6:32 PM  [facility] [address] [place], [place], [address] Phone: [phone]   Fax:  [phone]   Name: Collin Young MRN: [id number] Date of Birth: Sep 04, 1950

## 2017-04-01 ENCOUNTER — Ambulatory Visit: Payer: Medicare Other | Admitting: Speech Pathology

## 2017-04-05 ENCOUNTER — Ambulatory Visit: Payer: Medicare Other | Attending: Internal Medicine | Admitting: Speech Pathology

## 2017-04-05 DIAGNOSIS — R1312 Dysphagia, oropharyngeal phase: Secondary | ICD-10-CM | POA: Insufficient documentation

## 2017-04-05 NOTE — Therapy (Signed)
[facility] [address] [place], [place], [address] Phone: [phone]   Fax:  [phone]  Speech Language Pathology Treatment  Patient Details  Name: Collin Young MRN: [id number] Date of Birth: 07-26-1951 Referring Provider: [name], MD  Encounter Date: 04/05/2017      End of Session - 04/05/17 1812    Visit Number 3   Number of Visits 9   Date for SLP Re-Evaluation 05/21/17      Past Medical History:  Diagnosis Date  . Cancer [facility])    Esophagus- radiation 2011  . GERD (gastroesophageal reflux disease)    takes zantac prn  . Headache   . History of radiation therapy 10/19/2013-12/05/2013   pyriform sinus 69.96 Gy/67fx  . HOH (hard of hearing)   . Hyperlipemia   . Hypertension   . Seizures ([place])    alcohol withdrawls- 2001    Past Surgical History:  Procedure Laterality Date  . CARDIOVASCULAR STRESS TEST  10/09/09   normal nuclearr stress test, EF 57% [name])  . IR GASTROSTOMY TUBE MOD SED  01/13/2017  . IR PATIENT EVAL TECH 0-60 MINS  03/25/2017  . IR REMOVAL TUN ACCESS W/ PORT W/O FL MOD SED  01/15/2017  . LARYNGOSCOPY N/A 09/15/2013   Procedure: LARYNGOSCOPY;  Surgeon: [name], MD;  Location: [facility];  Service: ENT;  Laterality: N/A;  direct laryngoscopy with biopsy and esophagoscopy  . NO PAST SURGERIES    . RIGHT/LEFT HEART CATH AND CORONARY ANGIOGRAPHY N/A 01/05/2017   Procedure: Right/Left Heart Cath and Coronary Angiography;  Surgeon: [name], MD;  Location: [facility];  Service: Cardiovascular;  Laterality: N/A;    There were no vitals filed for this visit.      Subjective Assessment - 04/05/17 1804    Subjective "I've been doing my exercises."   Patient is accompained by: Family member   Currently in Pain? No/denies               ADULT SLP TREATMENT - 04/05/17 1534      General Information   Behavior/Cognition Alert;Cooperative   Patient  Positioning Upright in chair   Oral care provided N/A     Treatment Provided   Treatment provided Dysphagia     Dysphagia Treatment   Temperature Spikes Noted No   Respiratory Status Room air   Oral Cavity - Dentition Dentures, top;Dentures, bottom   Treatment Methods Therapeutic exercise;Patient/caregiver education   Patient observed directly with PO's No   Other treatment/comments Pt demo'd HEP including 20x jaw stretch, 30x effortful swallow, chin tuck against resistance with occasional min A for technique. SLP provided exercise tracker for pt to record exercise completion 2x per day. Jaw ROM measured 38mm today, within normal limits, after completion of jaw ROM exercises. Educated pt re: continuing exercises prior to retesting swallow function to determine if he is making functional gains in strength/mobility/airway protection. Explained that if MBS does show improvements in function, pt will need to continue exercises as maintenance given late-effects of radiation.     Pain Assessment   Pain Assessment No/denies pain     Assessment / Recommendations / Plan   Plan Continue with current plan of care     Dysphagia Recommendations   Diet recommendations NPO;Other(comment)  free water protocol     General Recommendations   Oral Care Recommendations Oral care Q4 per protocol;Oral care prior to ice chips     Progression Toward Goals   Progression  toward goals Progressing toward goals     General Information   Reason PO's not observed Other (comment)  exercises only given pt's high aspiration risk          SLP Education - 04/05/17 1811    Education provided Yes   Education Details late effects of xrt on swallow function, swallowing exercises must be continued to maintain gains   Person(s) Educated Patient;Child(ren)   Methods Explanation   Comprehension Verbalized understanding            SLP Long Term Goals - 04/05/17 1812      SLP LONG TERM GOAL #1   Title pt will  demo HEP with modified independence   Time 2   Period Weeks   Status On-going     SLP LONG TERM GOAL #2   Title Pt will verbalize precautions for free water protocol with min A   Time 2   Period Weeks   Status On-going     SLP LONG TERM GOAL #3   Title pt will tell SLP 3 signs of aspiration PNA with modified independence   Time 2   Period Weeks   Status On-going          Plan - 04/05/17 1812    Clinical Impression Statement Patient presents with severe risk for aspiration in the setting of fibrotic musculature following XRT in 2015 without compliance to swallowing exercises. Given that his recent MBS showed significant, silent aspiration of multiple consistencies despite attempts for positional compensations and pt's recent pneumonia, recommend pt continue all nutrition via PEG vs orally. Extensive education re: risks of aspiration, signs of pneumonia, free water protocol, and trained pt in swallowing exercises to improve fx. Patient appears motivated and reports completing exercises daily. Recommend skilled ST for continued training in exercises, risks of aspiration, and repeat MBS if pt is able to complete exercises as recommended.    Speech Therapy Frequency 2x / week   Duration 4 weeks   Treatment/Interventions Aspiration precaution training;SLP instruction and feedback;Cueing hierarchy;Patient/family education;Diet toleration management by SLP;Trials of upgraded texture/liquids   Potential to Achieve Goals Fair   Potential Considerations Previous level of function;Severity of impairments;Cooperation/participation level;Medical prognosis   SLP Home Exercise Plan reviewed and provided   Consulted and Agree with Plan of Care Patient;Family member/caregiver      Patient will benefit from skilled therapeutic intervention in order to improve the following deficits and impairments:   Dysphagia, oropharyngeal phase    Problem List Patient Active Problem List   Diagnosis Date  Noted  . SOB (shortness of breath)   . Angina decubitus ([place])   . Coronary artery disease of bypass graft of native heart with stable angina pectoris ([place])   . Aspiration pneumonia of both lower lobes due to gastric secretions ([place]) 01/11/2017  . Staphylococcus aureus bacteremia 01/11/2017  . Encounter for feeding tube placement   . Dysphagia   . Palliative care by specialist   . Acute respiratory failure with hypoxia ([place])   . Acute congestive heart failure ([place])   . Ischemic cardiomyopathy   . Acute pulmonary edema ([facility])   . Congestive dilated cardiomyopathy ([place])   . Acute combined systolic and diastolic heart failure ([place]) 01/02/2017  . AKI (acute kidney injury) ([place]) 01/02/2017  . Malignant neoplasm of pyriform sinus ([place]) 09/28/2013  . 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 NONSPECIFIC ABN FINDING RAD & OTH EXAM GI TRACT 05/14/2009   Deneise Lever, [place], [place]  Camie Patience 04/05/2017, 6:13 PM  [facility] [address] [place], [place], [address] Phone: [phone]   Fax:  [phone]   Name: Collin Young MRN: [id number] Date of Birth: 02-Apr-1951

## 2017-04-08 ENCOUNTER — Telehealth: Payer: Self-pay | Admitting: Speech Pathology

## 2017-04-08 ENCOUNTER — Ambulatory Visit: Payer: Medicare Other | Admitting: Speech Pathology

## 2017-04-08 DIAGNOSIS — R1312 Dysphagia, oropharyngeal phase: Secondary | ICD-10-CM

## 2017-04-08 NOTE — Therapy (Signed)
[facility] [address] [place], [place], [address] Phone: [phone]   Fax:  [phone]  Speech Language Pathology Treatment  Patient Details  Name: Collin Young MRN: [id number] Date of Birth: 06/03/1951 Referring Provider: [name], MD  Encounter Date: 04/08/2017      End of Session - 04/08/17 1511    Visit Number 4   Number of Visits 9   Date for SLP Re-Evaluation 05/21/17   SLP Start Time 1446   SLP Stop Time  1525   SLP Time Calculation (min) 39 min   Activity Tolerance Patient tolerated treatment well      Past Medical History:  Diagnosis Date  . Cancer [facility])    Esophagus- radiation 2011  . GERD (gastroesophageal reflux disease)    takes zantac prn  . Headache   . History of radiation therapy 10/19/2013-12/05/2013   pyriform sinus 69.96 Gy/39fx  . HOH (hard of hearing)   . Hyperlipemia   . Hypertension   . Seizures ([place])    alcohol withdrawls- 2001    Past Surgical History:  Procedure Laterality Date  . CARDIOVASCULAR STRESS TEST  10/09/09   normal nuclearr stress test, EF 57% [name])  . IR GASTROSTOMY TUBE MOD SED  01/13/2017  . IR PATIENT EVAL TECH 0-60 MINS  03/25/2017  . IR REMOVAL TUN ACCESS W/ PORT W/O FL MOD SED  01/15/2017  . LARYNGOSCOPY N/A 09/15/2013   Procedure: LARYNGOSCOPY;  Surgeon: [name], MD;  Location: [facility];  Service: ENT;  Laterality: N/A;  direct laryngoscopy with biopsy and esophagoscopy  . NO PAST SURGERIES    . RIGHT/LEFT HEART CATH AND CORONARY ANGIOGRAPHY N/A 01/05/2017   Procedure: Right/Left Heart Cath and Coronary Angiography;  Surgeon: [name], MD;  Location: [place] CV [facility];  Service: Cardiovascular;  Laterality: N/A;    There were no vitals filed for this visit.      Subjective Assessment - 04/08/17 1447    Subjective Pt arrives with completed exercise checklist.   Patient is accompained by: Family member   Currently in Pain?  Yes   Pain Score 8    Pain Location --  PEG site   Pain Descriptors / Indicators Aching;Sore   Pain Type Acute pain   Pain Onset Yesterday   Aggravating Factors  picked up something heavy               ADULT SLP TREATMENT - 04/08/17 1448      General Information   Behavior/Cognition Alert;Cooperative   Patient Positioning Upright in chair   Oral care provided N/A     Treatment Provided   Treatment provided Dysphagia     Dysphagia Treatment   Temperature Spikes Noted No   Respiratory Status Room air   Oral Cavity - Dentition Dentures, top;Dentures, bottom   Treatment Methods Therapeutic exercise;Patient/caregiver education   Patient observed directly with PO's Yes   Type of PO's observed Ice chips;Thin liquids   Feeding Able to feed self   Liquids provided via Teaspoon   Pharyngeal Phase Signs & Symptoms Immediate throat clear;Delayed throat clear   Type of cueing Verbal   Amount of cueing Minimal   Other treatment/comments Pt demo'd HEP for dysphagia (30x jaw stretch, 30 effortful swallows, chin tuck against resistance) with occasional min A for technique. Pt initially unable to state s/sx of aspiration pneumonia, however he does state 3 signs with cues from his daughter. Provided education re: oral care prior to  ice, water to reduce risks for aspiration pneumonia. Discussed scheduling repeat MBS, will consult MD for referral. Trained pt in supraglottic swallow; completes with mod A.     Assessment / Recommendations / Plan   Plan Continue with current plan of care     Progression Toward Goals   Progression toward goals Progressing toward goals          SLP Education - 04/08/17 1510    Education provided Yes   Education Details repeat MBS to determine if exercises have made an impact on swallow fx, oral care   Person(s) Educated Patient;Child(ren)   Methods Explanation   Comprehension Verbalized understanding            SLP Long Term Goals - 04/08/17  1515      SLP LONG TERM GOAL #1   Title pt will demo HEP with modified independence   Time 2   Period Weeks   Status On-going     SLP LONG TERM GOAL #2   Title Pt will verbalize precautions for free water protocol with min A   Time 2   Period Weeks   Status On-going     SLP LONG TERM GOAL #3   Title pt will tell SLP 3 signs of aspiration PNA with modified independence   Time 2   Period Weeks   Status On-going          Plan - 04/08/17 1511    Clinical Impression Statement Patient has been completing swallowing exercise program diligently twice per day. Patient remains at severe risk for aspiration in the setting of fibrotic musculature following XRT in 2015 without compliance to maintenance swallowing exercises. Given that his recent MBS showed significant, silent aspiration of multiple consistencies despite attempts for positional compensations and pt's recent pneumonia, recommend repeat MBS prior to resuming PO diet to determine whether pt's exercises have made an impact on his swallowing function. Recommend skilled ST for continued training in exercises, risks of aspiration, and repeat MBS as pt has been completing exercises as recommended.    Speech Therapy Frequency 2x / week   Duration 4 weeks   Treatment/Interventions Aspiration precaution training;SLP instruction and feedback;Cueing hierarchy;Patient/family education;Diet toleration management by SLP;Trials of upgraded texture/liquids   Potential to Achieve Goals Fair   Potential Considerations Previous level of function;Severity of impairments;Cooperation/participation level;Medical prognosis   SLP Home Exercise Plan reviewed and provided   Consulted and Agree with Plan of Care Patient;Family member/caregiver      Patient will benefit from skilled therapeutic intervention in order to improve the following deficits and impairments:   Dysphagia, oropharyngeal phase    Problem List Patient Active Problem List    Diagnosis Date Noted  . SOB (shortness of breath)   . Angina decubitus ([place])   . Coronary artery disease of bypass graft of native heart with stable angina pectoris ([place])   . Aspiration pneumonia of both lower lobes due to gastric secretions ([place]) 01/11/2017  . Staphylococcus aureus bacteremia 01/11/2017  . Encounter for feeding tube placement   . Dysphagia   . Palliative care by specialist   . Acute respiratory failure with hypoxia ([place])   . Acute congestive heart failure ([place])   . Ischemic cardiomyopathy   . Acute pulmonary edema ([facility])   . Congestive dilated cardiomyopathy ([place])   . Acute combined systolic and diastolic heart failure ([place]) 01/02/2017  . AKI (acute kidney injury) ([place]) 01/02/2017  . Malignant neoplasm of pyriform sinus ([facility]) 09/28/2013  . Piriform sinus  tumor 09/24/2013  . NONSPECIFIC ABN FINDING RAD & OTH EXAM GI TRACT 05/14/2009   Deneise Lever, [place], [place] Speech-Language Pathologist  Aliene Altes 04/08/2017, 3:29 PM  [facility] [address] [place], [place], [address] Phone: [phone]   Fax:  [phone]   Name: AIRON SAHNI MRN: [id number] Date of Birth: 09-29-1950

## 2017-04-08 NOTE — Telephone Encounter (Signed)
Spoke with RN for pt's PCP [name], MD to request referral for OP MBS. She requested fax. Request faxed to [phone] at 4 pm.  Deneise Lever, [place], [place] Speech-Language Pathologist

## 2017-04-12 ENCOUNTER — Ambulatory Visit: Payer: Medicare Other | Admitting: Speech Pathology

## 2017-04-15 ENCOUNTER — Ambulatory Visit: Payer: Medicare Other | Admitting: Speech Pathology

## 2017-04-19 ENCOUNTER — Ambulatory Visit: Payer: Medicare Other | Admitting: Speech Pathology

## 2017-04-19 NOTE — Telephone Encounter (Signed)
Pt no showed for 3:30 appt, cancelled 2 previous appointments. SLP called to f/u; pt states he had "a situation come up" but is planning to attend his next appointment on Thursday. Informed re: clinic no show policy.   Deneise Lever, MS, Actor

## 2017-04-22 ENCOUNTER — Other Ambulatory Visit: Payer: Self-pay | Admitting: *Deleted

## 2017-04-22 ENCOUNTER — Encounter: Payer: Self-pay | Admitting: Speech Pathology

## 2017-04-22 DIAGNOSIS — E039 Hypothyroidism, unspecified: Secondary | ICD-10-CM

## 2017-04-22 DIAGNOSIS — C12 Malignant neoplasm of pyriform sinus: Secondary | ICD-10-CM

## 2017-04-22 MED ORDER — FENTANYL 12 MCG/HR TD PT72: MEDICATED_PATCH | TRANSDERMAL | 0 refills | Status: DC

## 2017-04-26 ENCOUNTER — Telehealth: Payer: Self-pay | Admitting: Speech Pathology

## 2017-04-26 NOTE — Telephone Encounter (Signed)
Patient's daughter called to request whether pt needed to follow up as no further ST appointments are scheduled. Informed her that pt has either canceled or no showed for his last several appointments, and that if pt has been completing his exercises as instructed that he would benefit from repeat MBS prior to scheduling additional ST appointments to determine if he has made any functional improvements in swallowing. Informed daughter that I will forward this information to PCP and make another request for order for OP MBS.  Deneise Lever, MS, Actor

## 2017-04-26 NOTE — Addendum Note (Signed)
Addended by: Aliene Altes on: 04/26/2017 01:50 PM   Modules accepted: Orders

## 2017-04-29 ENCOUNTER — Encounter: Payer: Self-pay | Admitting: Cardiovascular Disease

## 2017-04-29 ENCOUNTER — Ambulatory Visit (INDEPENDENT_AMBULATORY_CARE_PROVIDER_SITE_OTHER): Payer: Medicare Other | Admitting: Cardiovascular Disease

## 2017-04-29 VITALS — BP 102/60 | HR 79 | Ht 66.0 in | Wt 118.6 lb

## 2017-04-29 DIAGNOSIS — E7800 Pure hypercholesterolemia, unspecified: Secondary | ICD-10-CM

## 2017-04-29 DIAGNOSIS — I351 Nonrheumatic aortic (valve) insufficiency: Secondary | ICD-10-CM | POA: Diagnosis not present

## 2017-04-29 DIAGNOSIS — I2510 Atherosclerotic heart disease of native coronary artery without angina pectoris: Secondary | ICD-10-CM

## 2017-04-29 DIAGNOSIS — I5042 Chronic combined systolic (congestive) and diastolic (congestive) heart failure: Secondary | ICD-10-CM

## 2017-04-29 NOTE — Progress Notes (Signed)
Cardiology Office Note   Date:  04/29/2017   ID:  Denham, Mose [birth year], MRN [id number]  PCP:  [name], MD  Cardiologist:   Skeet Latch, MD   Chief Complaint  Patient presents with  . Follow-up    3 months;      History of Present Illness: KITAI PURDOM is a 66 y.o. 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  Mr. Balderston was admitted 12/2016 with progressive shortness of breath.  BNP was >2000.  Echo revealed LVEF 10-15% with diffuse hypokinesis, moderate MR and moderate AR.  PASP was 49 mmHg.  He underwent left heart catheterization and was found to have 100% proximal LCx with L-->L collaterals, 99% prox-mid RCA and otherwise mild-moderate CAD.  There was consideration of RCA PCI.  However, this would have required hemodynamic support and was deferred given his frailty and comorbidities.  He was treated with milrinone and IV lasix.  Milrinone was weaned prior to discharge.  During that hospitalization he was also treated for MSSA bacteremia.  He was unable to get a TEE due to his prior cancer.  His Port was removed and a PICC line was placed.   He had a  PEG tube placed due to frank aspiration.  Mr. Gieselman has been doing well.  He has returned to work Soil scientist.  He has no chest pain or shortness of breath with this activity.  He hasn't noted any lower extremity edema, orthopnea or PND.  He saw Almyra Deforest, [place], on 02/2017 and was doing well.  He has been using his g-tube for feedings but he also has some table food in the evenings despite recommendations that he not do so.  He denies frank aspiration.  Mr. Eskew is scheduled for a repeat swallow evaluation next week.     Past Medical History:  Diagnosis Date  . Cancer [facility])    Esophagus- radiation 2011  . GERD  (gastroesophageal reflux disease)    takes zantac prn  . Headache   . History of radiation therapy 10/19/2013-12/05/2013   pyriform sinus 69.96 Gy/42fx  . HOH (hard of hearing)   . Hyperlipemia   . Hypertension   . Seizures ([place])    alcohol withdrawls- 2001    Past Surgical History:  Procedure Laterality Date  . CARDIOVASCULAR STRESS TEST  10/09/09   normal nuclearr stress test, EF 57% [name])  . IR GASTROSTOMY TUBE MOD SED  01/13/2017  . IR PATIENT EVAL TECH 0-60 MINS  03/25/2017  . IR REMOVAL TUN ACCESS W/ PORT W/O FL MOD SED  01/15/2017  . LARYNGOSCOPY N/A 09/15/2013   Procedure: LARYNGOSCOPY;  Surgeon: [name], MD;  Location: [facility];  Service: ENT;  Laterality: N/A;  direct laryngoscopy with biopsy and esophagoscopy  . NO PAST SURGERIES    . RIGHT/LEFT HEART CATH AND CORONARY ANGIOGRAPHY N/A 01/05/2017   Procedure: Right/Left Heart Cath and Coronary Angiography;  Surgeon: [name], MD;  Location: [facility];  Service: Cardiovascular;  Laterality: N/A;     Current Outpatient Prescriptions  Medication Sig Dispense Refill  . aspirin 81 MG chewable tablet Place 1 tablet (81 mg total) into feeding tube daily. 30 tablet 0  . atorvastatin (LIPITOR) 20 MG tablet Take 20 mg by mouth daily.    . carvedilol (  COREG) 6.25 MG tablet Place 1 tablet (6.25 mg total) into feeding tube 2 (two) times daily with a meal. 60 tablet 0  . fentaNYL (DURAGESIC - DOSED MCG/HR) 12 MCG/HR Place 1 patch every 3 days to the skin 10 patch 0  . furosemide (LASIX) 40 MG tablet Place 1 tablet (40 mg total) into feeding tube 2 (two) times daily. 60 tablet 5  . isosorbide mononitrate (ISMO,MONOKET) 10 MG tablet take 1 tab (10mg ) per tube BID 60 tablet 0  . Nutritional Supplements (FEEDING SUPPLEMENT, JEVITY 1.2 CAL,) LIQD Place 1,000 mLs into feeding tube continuous. 65cc/hr 1500 mL 30  . sacubitril-valsartan (ENTRESTO) 24-26 MG Take 1 tablet by mouth 2 (two) times daily. 60 tablet 10  .  spironolactone (ALDACTONE) 25 MG tablet Place 0.5 tablets (12.5 mg total) into feeding tube daily. 30 tablet 2   No current facility-administered medications for this visit.    Facility-Administered Medications Ordered in Other Visits  Medication Dose Route Frequency Provider Last Rate Last Dose  . topical emolient (BIAFINE) emulsion   Topical Daily Kyung Rudd, MD        Allergies:   Patient has no known allergies.    Social History:  The patient  reports that he quit smoking about 17 years ago. His smoking use included Cigarettes. He started smoking about 56 years ago. He has a 40.00 pack-year smoking history. He quit smokeless tobacco use about 17 years ago. His smokeless tobacco use included Chew. He reports that he does not drink alcohol or use drugs.   Family History:  The patient's family history includes Breast cancer in his mother.    ROS:  Please see the history of present illness.   Otherwise, review of systems are positive for none.   All other systems are reviewed and negative.    PHYSICAL EXAM: VS:  BP 102/60   Pulse 79   Ht 5\' 6"  (1.676 m)   Wt 53.8 kg (118 lb 9.6 oz)   BMI 19.14 kg/m  , BMI Body mass index is 19.14 kg/m. GENERAL:  Frail, elderly man.  Well appearing HEENT:  Pupils equal round and reactive, fundi not visualized, oral mucosa unremarkable NECK:  No jugular venous distention, waveform within normal limits, carotid upstroke brisk and symmetric, no bruits LUNGS:  Rhonci at right base HEART:  RRR.  PMI not displaced or sustained,S1 and S2 within normal limits, no S3, no S4, no clicks, +apical rub, no murmurs ABD:  Flat, positive bowel sounds normal in frequency in pitch, no bruits, no rebound, no guarding, no midline pulsatile mass, no hepatomegaly, no splenomegaly EXT:  2 plus pulses throughout, no edema, no cyanosis no clubbing SKIN:  No rashes no nodules NEURO:  Cranial nerves II through XII grossly intact, motor grossly intact throughout PSYCH:   Cognitively intact, oriented to person place and time   EKG:  EKG is not ordered today.  Echo 01/03/17: Study Conclusions  - Left ventricle: The cavity size was normal. Wall thickness was   normal. The estimated ejection fraction was in the range of 10%   to 15%. Diffuse hypokinesis. DIastolic function is abnormal,   indeterminant grade. - Aortic valve: There was moderate regurgitation. Valve area (VTI):   1.08 cm^2. Valve area (Vmax): 1.5 cm^2. Valve area (Vmean): 1.5   cm^2. - Mitral valve: There was moderate regurgitation. - Left atrium: The atrium was mildly dilated. - Right ventricle: Systolic function was mildly to moderately   reduced. - Tricuspid valve: There was moderate  regurgitation. - Pulmonary arteries: Systolic pressure was moderately increased.   PA peak pressure: 49 mm Hg (S).  LHC 01/05/17:  Prox RCA to Mid RCA lesion, 99 %stenosed.  Mid RCA lesion, 30 %stenosed.  Dist RCA lesion, 40 %stenosed.  RPDA lesion, 20 %stenosed.  Prox Cx lesion, 100 %stenosed.  Ost Ramus to Ramus lesion, 50 %stenosed.  Ost 3rd Diag to 3rd Diag lesion, 30 %stenosed.  Mid LAD lesion, 40 %stenosed.  Ost LAD to Prox LAD lesion, 20 %stenosed.  Ost LM to LM lesion, 40 %stenosed.  Hemodynamic findings consistent with mild pulmonary hypertension.   1. Severe calcific disease in the proximal to mid RCA 2. 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 Moderate left main and mid LAD stenosis 4. Elevated filling pressures  Recent Labs: 01/08/2017: Magnesium 2.7 01/14/2017: B Natriuretic Peptide >4,500.0 01/27/2017: NT-Pro BNP 11,301 03/10/2017: ALT 18; BUN 14.7; Creatinine 1.1; HGB 12.4; Platelets 258; Potassium 4.3; Sodium 137; TSH 2.085    Lipid Panel No results found for: CHOL, TRIG, HDL, CHOLHDL, VLDL, LDLCALC, LDLDIRECT    Wt Readings from Last 3 Encounters:  04/29/17 53.8 kg (118 lb 9.6 oz)  03/10/17 51.7 kg (114 lb)  03/02/17 52.2 kg (115 lb)      ASSESSMENT  AND PLAN:  # Chronic systolic and diastolic heart failure:  # Moderate MR: # Moderate AR:  LVEF 10-15%.  He was successfully weaned from milrinone while in the hospital.  Mr. Larson Limones to do very well and has no symptoms. Continue Entresto, carvedilol, and spironolactone.  Likely ischemic cardiomyopathy. He did also have chemo and XR to the chest.  We will repeat his echo given that it has been 3 months.   # Obstructive CAD:  # Hyperlipidemia:  Medically managed.  100% LCx and 99% RCA.  The RCA would require hemodynamic support.  He has no chest pain so we will pursue medical management for now.  Continue aspirin, carvedilol, Imdur and atorvastatin.  Check lipids and CMP.    Current medicines are reviewed at length with the patient today.  The patient does not have concerns regarding medicines.  The following changes have been made:  none  Labs/ tests ordered today include:  Orders Placed This Encounter  Procedures  . Lipid panel  . Comprehensive metabolic panel  . ECHOCARDIOGRAM COMPLETE     Disposition:   FU with Reco Shonk C. Oval Linsey, MD, [facility] in 3 months.   This note was written with the assistance of speech recognition software.  Please excuse any transcriptional errors.  Signed, Winona Sison C. Oval Linsey, MD, [facility]  04/29/2017 10:38 AM    [facility]

## 2017-04-29 NOTE — Patient Instructions (Signed)
Medication Instructions:  Your physician recommends that you continue on your current medications as directed. Please refer to the Current Medication list given to you today.  Labwork: FASTING LP/CMET TOMORROW MORNING  Testing/Procedures: Your physician has requested that you have an echocardiogram. Echocardiography is a painless test that uses sound waves to create images of your heart. It provides your doctor with information about the size and shape of your heart and how well your heart's chambers and valves are working. This procedure takes approximately one hour. There are no restrictions for this procedure. [place] [address]  Follow-Up: Your physician recommends that you schedule a follow-up appointment in: [place]  If you need a refill on your cardiac medications before your next appointment, please call your pharmacy.

## 2017-04-30 LAB — COMPREHENSIVE METABOLIC PANEL
A/G RATIO: 1.2 (ref 1.2–2.2)
ALBUMIN: 4.3 g/dL (ref 3.6–4.8)
ALK PHOS: 49 IU/L (ref 39–117)
ALT: 21 IU/L (ref 0–44)
AST: 31 IU/L (ref 0–40)
BILIRUBIN TOTAL: 0.4 mg/dL (ref 0.0–1.2)
BUN / CREAT RATIO: 21 (ref 10–24)
BUN: 22 mg/dL (ref 8–27)
CHLORIDE: 91 mmol/L — AB (ref 96–106)
CO2: 28 mmol/L (ref 20–29)
Calcium: 10.1 mg/dL (ref 8.6–10.2)
Creatinine, Ser: 1.07 mg/dL (ref 0.76–1.27)
GFR calc Af Amer: 83 mL/min/{1.73_m2} (ref >59)
GFR calc non Af Amer: 72 mL/min/{1.73_m2} (ref >59)
GLUCOSE: 83 mg/dL (ref 65–99)
Globulin, Total: 3.6 g/dL (ref 1.5–4.5)
POTASSIUM: 4.8 mmol/L (ref 3.5–5.2)
Sodium: 136 mmol/L (ref 134–144)
Total Protein: 7.9 g/dL (ref 6.0–8.5)

## 2017-04-30 LAB — LIPID PANEL
CHOL/HDL RATIO: 2.7 ratio (ref 0.0–5.0)
Cholesterol, Total: 167 mg/dL (ref 100–199)
HDL: 61 mg/dL (ref >39)
LDL Calculated: 92 mg/dL (ref 0–99)
TRIGLYCERIDES: 69 mg/dL (ref 0–149)
VLDL CHOLESTEROL CAL: 14 mg/dL (ref 5–40)

## 2017-05-06 ENCOUNTER — Ambulatory Visit (HOSPITAL_COMMUNITY): Payer: Medicare Other | Attending: Cardiovascular Disease

## 2017-05-06 ENCOUNTER — Other Ambulatory Visit: Payer: Self-pay

## 2017-05-06 DIAGNOSIS — E119 Type 2 diabetes mellitus without complications: Secondary | ICD-10-CM | POA: Insufficient documentation

## 2017-05-06 DIAGNOSIS — I110 Hypertensive heart disease with heart failure: Secondary | ICD-10-CM | POA: Insufficient documentation

## 2017-05-06 DIAGNOSIS — R0602 Shortness of breath: Secondary | ICD-10-CM | POA: Insufficient documentation

## 2017-05-06 DIAGNOSIS — I080 Rheumatic disorders of both mitral and aortic valves: Secondary | ICD-10-CM | POA: Diagnosis not present

## 2017-05-06 DIAGNOSIS — I5042 Chronic combined systolic (congestive) and diastolic (congestive) heart failure: Secondary | ICD-10-CM | POA: Diagnosis not present

## 2017-05-06 DIAGNOSIS — E785 Hyperlipidemia, unspecified: Secondary | ICD-10-CM | POA: Diagnosis not present

## 2017-05-06 HISTORY — PX: TRANSTHORACIC ECHOCARDIOGRAM: SHX275

## 2017-05-12 ENCOUNTER — Other Ambulatory Visit (HOSPITAL_BASED_OUTPATIENT_CLINIC_OR_DEPARTMENT_OTHER): Payer: Medicare Other

## 2017-05-12 ENCOUNTER — Ambulatory Visit (HOSPITAL_BASED_OUTPATIENT_CLINIC_OR_DEPARTMENT_OTHER): Payer: Medicare Other | Admitting: Hematology & Oncology

## 2017-05-12 ENCOUNTER — Telehealth: Payer: Self-pay | Admitting: Cardiovascular Disease

## 2017-05-12 VITALS — BP 90/52 | HR 90 | Temp 97.5°F | Resp 18 | Wt 121.0 lb

## 2017-05-12 DIAGNOSIS — I255 Ischemic cardiomyopathy: Secondary | ICD-10-CM

## 2017-05-12 DIAGNOSIS — R1013 Epigastric pain: Secondary | ICD-10-CM | POA: Diagnosis not present

## 2017-05-12 DIAGNOSIS — N289 Disorder of kidney and ureter, unspecified: Secondary | ICD-10-CM

## 2017-05-12 DIAGNOSIS — Z8501 Personal history of malignant neoplasm of esophagus: Secondary | ICD-10-CM

## 2017-05-12 DIAGNOSIS — I5041 Acute combined systolic (congestive) and diastolic (congestive) heart failure: Secondary | ICD-10-CM

## 2017-05-12 DIAGNOSIS — N179 Acute kidney failure, unspecified: Secondary | ICD-10-CM

## 2017-05-12 DIAGNOSIS — Z79899 Other long term (current) drug therapy: Secondary | ICD-10-CM

## 2017-05-12 DIAGNOSIS — C12 Malignant neoplasm of pyriform sinus: Secondary | ICD-10-CM

## 2017-05-12 DIAGNOSIS — I959 Hypotension, unspecified: Secondary | ICD-10-CM | POA: Diagnosis not present

## 2017-05-12 DIAGNOSIS — D508 Other iron deficiency anemias: Secondary | ICD-10-CM

## 2017-05-12 DIAGNOSIS — Z5181 Encounter for therapeutic drug level monitoring: Secondary | ICD-10-CM

## 2017-05-12 DIAGNOSIS — I509 Heart failure, unspecified: Secondary | ICD-10-CM | POA: Diagnosis not present

## 2017-05-12 DIAGNOSIS — E039 Hypothyroidism, unspecified: Secondary | ICD-10-CM

## 2017-05-12 LAB — CBC WITH DIFFERENTIAL (CANCER CENTER ONLY)
BASO#: 0 10*3/uL (ref 0.0–0.2)
BASO%: 0.3 % (ref 0.0–2.0)
EOS ABS: 0.3 10*3/uL (ref 0.0–0.5)
EOS%: 6.6 % (ref 0.0–7.0)
HCT: 39.4 % (ref 38.7–49.9)
HGB: 13.2 g/dL (ref 13.0–17.1)
LYMPH#: 0.9 10*3/uL (ref 0.9–3.3)
LYMPH%: 23.8 % (ref 14.0–48.0)
MCH: 29.7 pg (ref 28.0–33.4)
MCHC: 33.5 g/dL (ref 32.0–35.9)
MCV: 89 fL (ref 82–98)
MONO#: 0.8 10*3/uL (ref 0.1–0.9)
MONO%: 19.9 % — AB (ref 0.0–13.0)
NEUT#: 1.9 10*3/uL (ref 1.5–6.5)
NEUT%: 49.4 % (ref 40.0–80.0)
PLATELETS: 211 10*3/uL (ref 145–400)
RBC: 4.44 10*6/uL (ref 4.20–5.70)
RDW: 13.8 % (ref 11.1–15.7)
WBC: 3.9 10*3/uL — AB (ref 4.0–10.0)

## 2017-05-12 LAB — IRON AND TIBC
%SAT: 29 % (ref 20–55)
Iron: 91 ug/dL (ref 42–163)
TIBC: 309 ug/dL (ref 202–409)
UIBC: 218 ug/dL (ref 117–376)

## 2017-05-12 LAB — CMP (CANCER CENTER ONLY)
ALT(SGPT): 32 U/L (ref 10–47)
AST: 35 U/L (ref 11–38)
Albumin: 3.7 g/dL (ref 3.3–5.5)
Alkaline Phosphatase: 48 U/L (ref 26–84)
BUN: 23 mg/dL — AB (ref 7–22)
CHLORIDE: 92 meq/L — AB (ref 98–108)
CO2: 32 meq/L (ref 18–33)
CREATININE: 1.5 mg/dL — AB (ref 0.6–1.2)
Calcium: 9.7 mg/dL (ref 8.0–10.3)
GLUCOSE: 152 mg/dL — AB (ref 73–118)
POTASSIUM: 4 meq/L (ref 3.3–4.7)
SODIUM: 134 meq/L (ref 128–145)
TOTAL PROTEIN: 7.6 g/dL (ref 6.4–8.1)
Total Bilirubin: 0.6 mg/dl (ref 0.20–1.60)

## 2017-05-12 LAB — LACTATE DEHYDROGENASE: LDH: 210 U/L (ref 125–245)

## 2017-05-12 LAB — FERRITIN: Ferritin: 235 ng/ml (ref 22–316)

## 2017-05-12 MED ORDER — FENTANYL 12 MCG/HR TD PT72: MEDICATED_PATCH | TRANSDERMAL | 0 refills | Status: DC

## 2017-05-12 NOTE — Telephone Encounter (Signed)
Daughter calling to see if pt's Echo results are back.

## 2017-05-12 NOTE — Progress Notes (Signed)
Hematology and Oncology Follow Up Visit  Collin Young [id number] 1951-01-16 66 y.o. 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  Young is back for followup. He seems to be doing a little better. When we last saw him, he had a episode of severe congestive heart failure. His echocardiogram back in May showed an ejection fraction of 10-15%.  He had a repeat echocardiogram last week. This showed an ejection fraction of 45-50%.  He has markedly low blood pressure. He is on 2 different diuretics. He is on beta blockers. I think that the diuretics probably can be discontinued.  He has a feeding tube. He is using this. He is also eating.  He's having some abdominal discomfort. This is epigastric in nature. It might be some gastritis. Apparently, he is taking Motrin for this. I told him that he really needs to be taking and an acid or a PPI. I told him that this can be done over-the-counter.  I have not found any evidence of recurrent cancer.  Overall, I would say that his performance status is ECOG 2.   Medications:  Current Outpatient Prescriptions:  .  aspirin 81 MG chewable tablet, Place 1 tablet (81 mg total) into feeding tube daily., Disp: 30 tablet, Rfl: 0 .  atorvastatin (LIPITOR) 20 MG tablet, Take 20 mg by mouth daily., Disp: , Rfl:  .  carvedilol (COREG) 6.25 MG tablet, Place 1 tablet (6.25 mg total) into feeding tube 2 (two) times daily with a meal., Disp: 60 tablet, Rfl: 0 .  fentaNYL (DURAGESIC - DOSED MCG/HR) 12 MCG/HR, Place 1 patch every 3 days to the skin, Disp: 10 patch, Rfl: 0 .  isosorbide mononitrate (ISMO,MONOKET) 10 MG tablet, take 1 tab (10mg ) per tube BID, Disp: 60 tablet, Rfl: 0 .  Nutritional Supplements (FEEDING SUPPLEMENT, JEVITY 1.2 CAL,) LIQD,  Place 1,000 mLs into feeding tube continuous. 65cc/hr, Disp: 1500 mL, Rfl: 30 .  sacubitril-valsartan (ENTRESTO) 24-26 MG, Take 1 tablet by mouth 2 (two) times daily., Disp: 60 tablet, Rfl: 10 No current facility-administered medications for this visit.   Facility-Administered Medications Ordered in Other Visits:  .  topical emolient (BIAFINE) emulsion, , Topical, Daily, Kyung Rudd, MD  Allergies: No Known Allergies  Past Medical History, Surgical history, Social history, and Family History were reviewed and updated.  Review of Systems: As stated in the interim history  Physical Exam:  weight is 121 lb (54.9 kg). His oral temperature is 97.5 F (36.4 C) (abnormal). His blood pressure is 90/52 (abnormal) and his pulse is 90. His respiration is 18.   Thin African-American male. Head and neck exam shows no ocular or oral lesions. There is no adenopathy in the neck. Lungs are clear bilaterally. Cardiac exam regular rate and rhythm with no murmurs, rubs or bruits. Abdomen is soft. Has a feeding tube. There is no abdominal distention. He has good bowel sounds. There may be some tenderness in the epigastric area to palpation. There is no hepato-splenomegaly. Extremities shows no clubbing, cyanosis or edema. Neurological exam is non-focal. Skin exam shows no rashes.   Lab Results  Component Value Date   WBC 3.9 (L) 05/12/2017   HGB 13.2 05/12/2017   HCT 39.4 05/12/2017   MCV 89 05/12/2017   PLT 211 05/12/2017  Chemistry      Component Value Date/Time   NA 134 05/12/2017 0900   NA 137 03/10/2017 1000   K 4.0 05/12/2017 0900   K 4.3 03/10/2017 1000   CL 92 (L) 05/12/2017 0900   CO2 32 05/12/2017 0900   CO2 29 03/10/2017 1000   BUN 23 (H) 05/12/2017 0900   BUN 14.7 03/10/2017 1000   CREATININE 1.5 (H) 05/12/2017 0900   CREATININE 1.1 03/10/2017 1000      Component Value Date/Time   CALCIUM 9.7 05/12/2017 0900   CALCIUM 10.0 03/10/2017 1000   ALKPHOS 48 05/12/2017 0900    ALKPHOS 51 03/10/2017 1000   AST 35 05/12/2017 0900   AST 23 03/10/2017 1000   ALT 32 05/12/2017 0900   ALT 18 03/10/2017 1000   BILITOT 0.60 05/12/2017 0900   BILITOT 0.41 03/10/2017 1000         Impression and Plan: Collin Young is 66 year old gentleman. He had a localized recurrence of carcinoma of the left pyriform sinus. He underwent concurrent chemotherapy and radiation therapy. He finished his treatment back in April of 2015.   Has a history of squamous cell carcinoma of the esophagus. He was treated with radiation chemotherapy. This was completed back in 2010.  I am bothered by his low blood pressure. Again he is on 5 different blood pressure medications. Given the fact that his echocardiogram is much better, I think the diuretics can be stopped. I then this will help his blood pressure.  I don't see any obvious evidence of recurrent malignancy. I don't think we have to do any scans on him. The fact that his weight is back up is encouraging.  I would like to see him back in 6 weeks. I want to make sure that we follow him somewhat closely.  This is complicated. He has multiple problems. I took about 25 minutes with he and his daughter. I answered their questions. I explained my recommendations. They agree.   [name], MD 9/12/201810:19 AM

## 2017-05-13 MED ORDER — SPIRONOLACTONE 25 MG PO TABS: 12.5000 mg | ORAL_TABLET | Freq: Every day | ORAL | 3 refills | Status: DC

## 2017-05-13 NOTE — Telephone Encounter (Signed)
[name] ( Daughter) is calling to find out the results of her father Collin Young . Please call

## 2017-05-13 NOTE — Telephone Encounter (Signed)
Advised daughter, verbalized understanding  

## 2017-05-13 NOTE — Telephone Encounter (Signed)
ECHO is not read by Dr Oval Linsey yet but LV EF: 45% -   50%

## 2017-05-13 NOTE — Telephone Encounter (Signed)
Notified  [name] ( Daughter) (DPR) of Md recommendations, she will bring pt in for labwork in a week. She states that she has nor further questions at this time

## 2017-05-13 NOTE — Telephone Encounter (Signed)
S/w [name] ( Daughter) (DPR) informed that Dr Oval Linsey has not read ECHO yet, informed her of EF only. She states that she took pt to the Oncologist yesterday and his BP was low and she states that it has been running low so they wanted pt to stop lasix and spironolactone(this was rx'd by PCP). She states that pt experiences dizziness on occasion during the day, and when standing. Pt states that he has no swelling currently. They have not stopped these medications they wanted to see if you wanted pt to stop.

## 2017-05-13 NOTE — Telephone Encounter (Signed)
-----   Message from Skeet Latch, MD sent at 05/13/2017  1:14 PM EDT ----- Echo shows that his heart muscle has gotten MUCH stronger.  His LVEF has increased from 10-15% to 40-45%.  There is moderate leaking of the aortic valve.  Agree with stopping lasix and spironolactone.  Kidney function was a little elevated.  Repeat BMP in 1 week.

## 2017-05-21 LAB — BASIC METABOLIC PANEL
BUN/Creatinine Ratio: 12 (ref 10–24)
BUN: 14 mg/dL (ref 8–27)
CHLORIDE: 96 mmol/L (ref 96–106)
CO2: 25 mmol/L (ref 20–29)
Calcium: 9.5 mg/dL (ref 8.6–10.2)
Creatinine, Ser: 1.19 mg/dL (ref 0.76–1.27)
GFR, EST AFRICAN AMERICAN: 73 mL/min/{1.73_m2} (ref >59)
GFR, EST NON AFRICAN AMERICAN: 63 mL/min/{1.73_m2} (ref >59)
Glucose: 83 mg/dL (ref 65–99)
Potassium: 4.8 mmol/L (ref 3.5–5.2)
Sodium: 137 mmol/L (ref 134–144)

## 2017-05-24 ENCOUNTER — Telehealth: Payer: Self-pay | Admitting: *Deleted

## 2017-05-24 DIAGNOSIS — E785 Hyperlipidemia, unspecified: Secondary | ICD-10-CM

## 2017-05-24 DIAGNOSIS — Z5181 Encounter for therapeutic drug level monitoring: Secondary | ICD-10-CM

## 2017-05-24 MED ORDER — ATORVASTATIN CALCIUM 40 MG PO TABS: 40.0000 mg | ORAL_TABLET | Freq: Every day | ORAL | 5 refills | Status: DC

## 2017-05-24 NOTE — Telephone Encounter (Signed)
-----   Message from Skeet Latch, MD sent at 05/21/2017  6:57 PM EDT ----- Labs all look normal now.  Use lasix as needed for weight gain >2lb in one day or 5lb in one week.  He can also take it if he notes increased shortness of breath or swelling.

## 2017-05-24 NOTE — Telephone Encounter (Signed)
-----   Message from Skeet Latch, MD sent at 05/20/2017 10:53 PM EDT ----- Cholesterol levels are pretty good, but we want the LDL to be less than 70.  Increase atorvastatin to 40mg  and repeat lipids and CMP in 6 weeks.

## 2017-05-24 NOTE — Telephone Encounter (Signed)
Advised daughter of labs, ok per [facility]

## 2017-05-27 NOTE — Addendum Note (Signed)
Addended by: Aliene Altes on: 05/27/2017 08:12 AM   Modules accepted: Orders

## 2017-05-31 ENCOUNTER — Other Ambulatory Visit (HOSPITAL_COMMUNITY): Payer: Self-pay | Admitting: Internal Medicine

## 2017-05-31 ENCOUNTER — Telehealth: Payer: Self-pay | Admitting: Speech Pathology

## 2017-05-31 DIAGNOSIS — R1310 Dysphagia, unspecified: Secondary | ICD-10-CM

## 2017-05-31 NOTE — Telephone Encounter (Signed)
RN for Dr. Mancel Bale contacted me to notify me he has agreed to order OP MBS for Collin Young. I replaced an order in [facility] however as Dr. Mancel Bale is outside of [facility] I requested she fax a written order to scheduling office [phone] and request they contact pt to schedule.  Deneise Lever, MS, Actor

## 2017-06-07 ENCOUNTER — Ambulatory Visit (HOSPITAL_COMMUNITY)
Admission: RE | Admit: 2017-06-07 | Discharge: 2017-06-07 | Disposition: A | Discharge status: Home / Self Care | Payer: Medicare Other | Source: Ambulatory Visit | Attending: Internal Medicine | Admitting: Internal Medicine

## 2017-06-07 DIAGNOSIS — R1312 Dysphagia, oropharyngeal phase: Secondary | ICD-10-CM | POA: Insufficient documentation

## 2017-06-07 DIAGNOSIS — R1310 Dysphagia, unspecified: Secondary | ICD-10-CM

## 2017-06-07 NOTE — Progress Notes (Signed)
Modified Barium Swallow Progress Note  Patient Details  Name: LIAM CAMMARATA MRN: [id number] Date of Birth: 1950/10/02  Today's Date: 06/07/2017  Modified Barium Swallow completed.  Full report located under Chart Review in the Imaging Section.  Brief recommendations include the following:  Clinical Impression  Pt demonstrates ongoing severe oropharyngeal dysphagia, though general strength and airway protection does appear improved since MBS during acute stay. There is still significantly reduced hyolaryngeal mobility across structures with limited peristalsis, penetration during the swallow due to lack of vestibular closure and severe residuals throughout the pharynx post swallow. Thin liquids are silently aspirated during/after the swallow but clear with a hard volitional cough. A head turn to the left also improved pharyngeal peristalsis and increases UES opening for bolus transit leading to reduced pooling in the pyriforms and fewer aspiration events.   Pt wants to know if he can stop using his PEG tube, which is beyond my scope of practice. I do advise the pt to continue drinking exclusively water, keep his mouth very clean with removal of dentures and discuss use of a chlorhexidine gluconate rinse with primary care physician. These methods may reduce the impact of aspiration and preserve pumonary hygiene. Pt may eat foods and drink water with increased frequency during the day, utilizing a head turn left, and a preventative hard cough after sips. Increased PO intake could warrant adjustment of tube feeding, to be discussed with MD. Would not recommend removal of PEG tube as pt is expected to have a chronic dysphagia and may have increased need for supplemental nutrition in the future with acute illness or with advanced age. Again advised pt that a home exercise program implemented by OP SLP would also help prevent further decline of pharyngeal function.    Swallow Evaluation  Recommendations       SLP Diet Recommendations: Regular solids;Thin liquid   Liquid Administration via: Cup;Straw   Medication Administration: Via alternative means   Supervision: Patient able to self feed   Compensations: Multiple dry swallows after each bite/sip;Follow solids with liquid;Hard cough after swallow   Postural Changes:  (Head turn left)   Oral Care Recommendations: Patient independent with oral care (consider CHG rinse)      Herbie Baltimore, MA CCC-SLP [phone]   Kassady Laboy, Katherene Ponto 06/07/2017,12:47 PM

## 2017-06-07 NOTE — Progress Notes (Signed)
   06/07/17 1200  SLP G-Codes **NOT FOR INPATIENT CLASS**  Functional Assessment Tool Used clinical judgement  Functional Limitations Swallowing  Swallow Current Status (L7989) CK  Swallow Goal Status (Q1194) CK  Swallow Discharge Status (R7408) CK  SLP Evaluations  $ SLP Speech Visit 1 Visit  SLP Evaluations  $MBS Swallow 1 Procedure  $Swallowing Treatment 1 Procedure

## 2017-06-16 ENCOUNTER — Ambulatory Visit: Payer: Self-pay | Admitting: Hematology & Oncology

## 2017-06-16 ENCOUNTER — Other Ambulatory Visit: Payer: Self-pay

## 2017-06-18 ENCOUNTER — Other Ambulatory Visit (HOSPITAL_BASED_OUTPATIENT_CLINIC_OR_DEPARTMENT_OTHER): Payer: Medicare Other

## 2017-06-18 ENCOUNTER — Ambulatory Visit (HOSPITAL_BASED_OUTPATIENT_CLINIC_OR_DEPARTMENT_OTHER): Payer: Medicare Other | Admitting: Hematology & Oncology

## 2017-06-18 VITALS — BP 135/68 | HR 76 | Temp 98.1°F | Resp 20 | Wt 125.0 lb

## 2017-06-18 DIAGNOSIS — D508 Other iron deficiency anemias: Secondary | ICD-10-CM

## 2017-06-18 DIAGNOSIS — Z8501 Personal history of malignant neoplasm of esophagus: Secondary | ICD-10-CM

## 2017-06-18 DIAGNOSIS — C12 Malignant neoplasm of pyriform sinus: Secondary | ICD-10-CM

## 2017-06-18 DIAGNOSIS — N63 Unspecified lump in unspecified breast: Secondary | ICD-10-CM | POA: Diagnosis not present

## 2017-06-18 DIAGNOSIS — I255 Ischemic cardiomyopathy: Secondary | ICD-10-CM

## 2017-06-18 DIAGNOSIS — N6311 Unspecified lump in the right breast, upper outer quadrant: Secondary | ICD-10-CM

## 2017-06-18 DIAGNOSIS — E039 Hypothyroidism, unspecified: Secondary | ICD-10-CM

## 2017-06-18 LAB — CBC WITH DIFFERENTIAL (CANCER CENTER ONLY)
BASO#: 0 10*3/uL (ref 0.0–0.2)
BASO%: 0.3 % (ref 0.0–2.0)
EOS ABS: 0.1 10*3/uL (ref 0.0–0.5)
EOS%: 1.6 % (ref 0.0–7.0)
HCT: 39 % (ref 38.7–49.9)
HGB: 13 g/dL (ref 13.0–17.1)
LYMPH#: 1.3 10*3/uL (ref 0.9–3.3)
LYMPH%: 32.5 % (ref 14.0–48.0)
MCH: 29.5 pg (ref 28.0–33.4)
MCHC: 33.3 g/dL (ref 32.0–35.9)
MCV: 88 fL (ref 82–98)
MONO#: 0.9 10*3/uL (ref 0.1–0.9)
MONO%: 22.6 % — AB (ref 0.0–13.0)
NEUT%: 43 % (ref 40.0–80.0)
NEUTROS ABS: 1.7 10*3/uL (ref 1.5–6.5)
Platelets: 262 10*3/uL (ref 145–400)
RBC: 4.41 10*6/uL (ref 4.20–5.70)
RDW: 13 % (ref 11.1–15.7)
WBC: 3.9 10*3/uL — AB (ref 4.0–10.0)

## 2017-06-18 LAB — CMP (CANCER CENTER ONLY)
ALBUMIN: 3.6 g/dL (ref 3.3–5.5)
ALT(SGPT): 29 U/L (ref 10–47)
AST: 35 U/L (ref 11–38)
Alkaline Phosphatase: 45 U/L (ref 26–84)
BUN, Bld: 19 mg/dL (ref 7–22)
CALCIUM: 9.7 mg/dL (ref 8.0–10.3)
CHLORIDE: 97 meq/L — AB (ref 98–108)
CO2: 32 mEq/L (ref 18–33)
CREATININE: 1.3 mg/dL — AB (ref 0.6–1.2)
Glucose, Bld: 93 mg/dL (ref 73–118)
Potassium: 4.6 mEq/L (ref 3.3–4.7)
SODIUM: 145 meq/L (ref 128–145)
TOTAL PROTEIN: 7.8 g/dL (ref 6.4–8.1)
Total Bilirubin: 0.5 mg/dl (ref 0.20–1.60)

## 2017-06-18 LAB — CHCC SATELLITE - SMEAR

## 2017-06-18 NOTE — Progress Notes (Signed)
Hematology and Oncology Follow Up Visit  Collin Young [id number] 1950-11-03 66 y.o. 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 is back for followup. He is doing okay. He still has the feeding tube in. This sounds like he is going to have this in permanently. His daughter says that he still has some aspiration.  He has had no problems with increased cough or shortness of breath. He still is hoarse. This is been chronic.  He's had no change in bowel or bladder habits. He's had no problems with nausea or vomiting. He says that he actually has had some solid food that he eats.  He has noticed a lump under the right breast area. It is not painful. I think we're going to have to get a mammogram to see what is going on.  His had no leg swelling. He's had no rashes.  He is on a fentanyl patch for pain. This seems to be working well.  We did get some iron studies on him back in September. His ferritin was 235 the iron saturation of 29%.  Overall, I would say that his performance status is ECOG 2.   Medications:  Current Outpatient Prescriptions:  .  aspirin 81 MG chewable tablet, Place 1 tablet (81 mg total) into feeding tube daily., Disp: 30 tablet, Rfl: 0 .  atorvastatin (LIPITOR) 40 MG tablet, Take 1 tablet (40 mg total) by mouth daily., Disp: 30 tablet, Rfl: 5 .  carvedilol (COREG) 6.25 MG tablet, Place 1 tablet (6.25 mg total) into feeding tube 2 (two) times daily with a meal., Disp: 60 tablet, Rfl: 0 .  fentaNYL (DURAGESIC - DOSED MCG/HR) 12 MCG/HR, Place 1 patch every 3 days to the skin, Disp: 10 patch, Rfl: 0 .  isosorbide mononitrate (ISMO,MONOKET) 10 MG tablet, take 1 tab (10mg ) per tube BID, Disp: 60 tablet, Rfl: 0 .  Nutritional Supplements (FEEDING  SUPPLEMENT, JEVITY 1.2 CAL,) LIQD, Place 1,000 mLs into feeding tube continuous. 65cc/hr, Disp: 1500 mL, Rfl: 30 .  sacubitril-valsartan (ENTRESTO) 24-26 MG, Take 1 tablet by mouth 2 (two) times daily., Disp: 60 tablet, Rfl: 10 No current facility-administered medications for this visit.   Facility-Administered Medications Ordered in Other Visits:  .  topical emolient (BIAFINE) emulsion, , Topical, Daily, Kyung Rudd, MD  Allergies: No Known Allergies  Past Medical History, Surgical history, Social history, and Family History were reviewed and updated.  Review of Systems: As stated in the interim history  Physical Exam:  weight is 125 lb (56.7 kg). His oral temperature is 98.1 F (36.7 C). His blood pressure is 135/68 and his pulse is 76. His respiration is 20 and oxygen saturation is 100%.   Fairly thin African-American male in no obvious distress. Head and neck exam shows no ocular or oral lesions. There are no palpable cervical or supraclavicular lymph nodes. Lungs are clear bilaterally. Cardiac exam regular rate and rhythm with a normal S1 and S2. He has 1/6 systolic ejection murmur. Breast exam does show a lump under the areola of the right breast. This is mobile. It by measures about 1 cm. It seems to be localized at the 10:00 position. There is no right axillary adenopathy. Left rest tissue is unremarkable. There is no nodularity of the left breast. There is  no left axillary adenopathy. Abdomen is soft. The feeding tube is intact. Bowel sounds are present. He has no fluid wave. There is no palpable liver or spleen tip. Extremities shows no clubbing, cyanosis or edema. Neurological exam shows no focal neurological deficits. Skin exam shows no rashes, ecchymoses or petechia.  Lab Results  Component Value Date   WBC 3.9 (L) 06/18/2017   HGB 13.0 06/18/2017   HCT 39.0 06/18/2017   MCV 88 06/18/2017   PLT 262 06/18/2017     Chemistry      Component Value Date/Time   NA 145 06/18/2017  1424   NA 137 03/10/2017 1000   K 4.6 06/18/2017 1424   K 4.3 03/10/2017 1000   CL 97 (L) 06/18/2017 1424   CO2 32 06/18/2017 1424   CO2 29 03/10/2017 1000   BUN 19 06/18/2017 1424   BUN 14.7 03/10/2017 1000   CREATININE 1.3 (H) 06/18/2017 1424   CREATININE 1.1 03/10/2017 1000      Component Value Date/Time   CALCIUM 9.7 06/18/2017 1424   CALCIUM 10.0 03/10/2017 1000   ALKPHOS 45 06/18/2017 1424   ALKPHOS 51 03/10/2017 1000   AST 35 06/18/2017 1424   AST 23 03/10/2017 1000   ALT 29 06/18/2017 1424   ALT 18 03/10/2017 1000   BILITOT 0.50 06/18/2017 1424   BILITOT 0.41 03/10/2017 1000         Impression and Plan: [name] is 66 year old gentleman. He had a localized recurrence of carcinoma of the left pyriform sinus. He underwent concurrent chemotherapy and radiation therapy. He finished his treatment back in April of 2015.   Has a history of squamous cell carcinoma of the esophagus. He was treated with radiation chemotherapy. This was completed back in 2010.  We will have to get a mammogram. Again I'm not sure what to be going on under the right breast. I told Mr. Luedke that men can have breast cancer. I would think could be a little unusual for him to have this but it is a possibility.  We'll see about getting the mammogram in a week or so.  I will like to see him back in another month or so. We have to maintain close follow-up with him right now as some things do seem to be changing.  I'm glad that his episode of heart failure is improving. I think sees his cardiologist in about a month or so.    [name], MD 10/19/20183:58 PM

## 2017-06-21 ENCOUNTER — Other Ambulatory Visit: Payer: Self-pay | Admitting: Hematology & Oncology

## 2017-06-21 DIAGNOSIS — N6310 Unspecified lump in the right breast, unspecified quadrant: Secondary | ICD-10-CM

## 2017-06-21 LAB — IRON AND TIBC
%SAT: 29 % (ref 20–55)
IRON: 84 ug/dL (ref 42–163)
TIBC: 291 ug/dL (ref 202–409)
UIBC: 207 ug/dL (ref 117–376)

## 2017-06-21 LAB — FERRITIN: Ferritin: 172 ng/ml (ref 22–316)

## 2017-06-21 LAB — LACTATE DEHYDROGENASE: LDH: 194 U/L (ref 125–245)

## 2017-06-25 ENCOUNTER — Ambulatory Visit
Admission: RE | Admit: 2017-06-25 | Discharge: 2017-06-25 | Disposition: A | Discharge status: Home / Self Care | Payer: Medicare Other | Source: Ambulatory Visit | Attending: Hematology & Oncology | Admitting: Hematology & Oncology

## 2017-06-25 ENCOUNTER — Ambulatory Visit: Admission: RE | Admit: 2017-06-25 | Payer: Self-pay | Source: Ambulatory Visit

## 2017-06-25 DIAGNOSIS — N6310 Unspecified lump in the right breast, unspecified quadrant: Secondary | ICD-10-CM

## 2017-06-30 ENCOUNTER — Other Ambulatory Visit: Payer: Self-pay | Admitting: *Deleted

## 2017-06-30 DIAGNOSIS — D508 Other iron deficiency anemias: Secondary | ICD-10-CM

## 2017-06-30 DIAGNOSIS — I255 Ischemic cardiomyopathy: Secondary | ICD-10-CM

## 2017-06-30 DIAGNOSIS — E039 Hypothyroidism, unspecified: Secondary | ICD-10-CM

## 2017-06-30 DIAGNOSIS — C12 Malignant neoplasm of pyriform sinus: Secondary | ICD-10-CM

## 2017-06-30 MED ORDER — FENTANYL 12 MCG/HR TD PT72: MEDICATED_PATCH | TRANSDERMAL | 0 refills | Status: DC

## 2017-07-01 MED FILL — fentaNYL 12 MCG/HR PT72: 12 | 30 days supply | Qty: 10 | Fill #0

## 2017-07-30 ENCOUNTER — Other Ambulatory Visit: Payer: Self-pay | Admitting: *Deleted

## 2017-07-30 ENCOUNTER — Ambulatory Visit (HOSPITAL_BASED_OUTPATIENT_CLINIC_OR_DEPARTMENT_OTHER): Payer: Medicare Other | Admitting: Hematology & Oncology

## 2017-07-30 ENCOUNTER — Ambulatory Visit (INDEPENDENT_AMBULATORY_CARE_PROVIDER_SITE_OTHER): Payer: Medicare Other | Admitting: Cardiovascular Disease

## 2017-07-30 ENCOUNTER — Other Ambulatory Visit (HOSPITAL_BASED_OUTPATIENT_CLINIC_OR_DEPARTMENT_OTHER): Payer: Medicare Other

## 2017-07-30 ENCOUNTER — Other Ambulatory Visit: Payer: Self-pay

## 2017-07-30 ENCOUNTER — Encounter: Payer: Self-pay | Admitting: Hematology & Oncology

## 2017-07-30 ENCOUNTER — Encounter: Payer: Self-pay | Admitting: Cardiovascular Disease

## 2017-07-30 VITALS — BP 117/59 | HR 95 | Temp 98.3°F | Resp 18 | Wt 133.0 lb

## 2017-07-30 VITALS — BP 130/62 | HR 83 | Ht 66.0 in | Wt 126.8 lb

## 2017-07-30 DIAGNOSIS — Z8501 Personal history of malignant neoplasm of esophagus: Secondary | ICD-10-CM | POA: Diagnosis not present

## 2017-07-30 DIAGNOSIS — C12 Malignant neoplasm of pyriform sinus: Secondary | ICD-10-CM

## 2017-07-30 DIAGNOSIS — E7800 Pure hypercholesterolemia, unspecified: Secondary | ICD-10-CM | POA: Diagnosis not present

## 2017-07-30 DIAGNOSIS — I5042 Chronic combined systolic (congestive) and diastolic (congestive) heart failure: Secondary | ICD-10-CM

## 2017-07-30 DIAGNOSIS — I351 Nonrheumatic aortic (valve) insufficiency: Secondary | ICD-10-CM

## 2017-07-30 DIAGNOSIS — E032 Hypothyroidism due to medicaments and other exogenous substances: Secondary | ICD-10-CM

## 2017-07-30 DIAGNOSIS — I2510 Atherosclerotic heart disease of native coronary artery without angina pectoris: Secondary | ICD-10-CM | POA: Diagnosis not present

## 2017-07-30 DIAGNOSIS — N6311 Unspecified lump in the right breast, upper outer quadrant: Secondary | ICD-10-CM

## 2017-07-30 LAB — CBC WITH DIFFERENTIAL (CANCER CENTER ONLY)
BASO#: 0 10*3/uL (ref 0.0–0.2)
BASO%: 0 % (ref 0.0–2.0)
EOS ABS: 0.2 10*3/uL (ref 0.0–0.5)
EOS%: 3.8 % (ref 0.0–7.0)
HCT: 42.3 % (ref 38.7–49.9)
HGB: 13.9 g/dL (ref 13.0–17.1)
LYMPH#: 0.8 10*3/uL — ABNORMAL LOW (ref 0.9–3.3)
LYMPH%: 18.9 % (ref 14.0–48.0)
MCH: 29.4 pg (ref 28.0–33.4)
MCHC: 32.9 g/dL (ref 32.0–35.9)
MCV: 89 fL (ref 82–98)
MONO#: 0.7 10*3/uL (ref 0.1–0.9)
MONO%: 16.4 % — AB (ref 0.0–13.0)
NEUT#: 2.7 10*3/uL (ref 1.5–6.5)
NEUT%: 60.9 % (ref 40.0–80.0)
PLATELETS: 191 10*3/uL (ref 145–400)
RBC: 4.73 10*6/uL (ref 4.20–5.70)
RDW: 13.3 % (ref 11.1–15.7)
WBC: 4.4 10*3/uL (ref 4.0–10.0)

## 2017-07-30 LAB — CMP (CANCER CENTER ONLY)
ALBUMIN: 3.5 g/dL (ref 3.3–5.5)
ALT(SGPT): 31 U/L (ref 10–47)
AST: 31 U/L (ref 11–38)
Alkaline Phosphatase: 49 U/L (ref 26–84)
BILIRUBIN TOTAL: 0.6 mg/dL (ref 0.20–1.60)
BUN: 17 mg/dL (ref 7–22)
CHLORIDE: 96 meq/L — AB (ref 98–108)
CO2: 33 mEq/L (ref 18–33)
CREATININE: 1.4 mg/dL — AB (ref 0.6–1.2)
Calcium: 9.6 mg/dL (ref 8.0–10.3)
Glucose, Bld: 149 mg/dL — ABNORMAL HIGH (ref 73–118)
Potassium: 3.9 mEq/L (ref 3.3–4.7)
SODIUM: 139 meq/L (ref 128–145)
TOTAL PROTEIN: 7.6 g/dL (ref 6.4–8.1)

## 2017-07-30 NOTE — Patient Instructions (Signed)

## 2017-07-30 NOTE — Progress Notes (Signed)
Hematology and Oncology Follow Up Visit  Collin Young [id number] 03-24-1951 66 y.o. 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  Young is back for followup.  He is doing fairly well.  He had a very nice Thanksgiving.  He is eating and drinking well.  He is not really using his feeding tube.  I am not sure why he needs the feeding tube in.  I gave him a bottle of cherry juice.  He drank the whole bottle.  There is no evidence of aspiration.  I think that the feeding tube can come out.  Says he really is not using this, I just hate to have this in and have this cause problems for him.  He has had no issues with nausea or vomiting.  He has had no cough or shortness of breath.  He has had no change in bowel or bladder habits.  He has had no diarrhea.  Overall, I would say that his performance status is ECOG 2.   There is been no headaches.  He is on a fentanyl patch.  This does help with any pain issues.  Overall, his performance status is ECOG 1.  Medications:  Current Outpatient Medications:  .  aspirin 81 MG chewable tablet, Place 1 tablet (81 mg total) into feeding tube daily., Disp: 30 tablet, Rfl: 0 .  atorvastatin (LIPITOR) 40 MG tablet, Take 1 tablet (40 mg total) by mouth daily., Disp: 30 tablet, Rfl: 5 .  carvedilol (COREG) 6.25 MG tablet, Place 1 tablet (6.25 mg total) into feeding tube 2 (two) times daily with a meal., Disp: 60 tablet, Rfl: 0 .  cephALEXin (KEFLEX) 250 MG capsule, take 1 capsule by mouth four times a day, Disp: , Rfl: 0 .  fentaNYL (DURAGESIC - DOSED MCG/HR) 12 MCG/HR, Place 1 patch every 3 days to the skin, Disp: 10 patch, Rfl: 0 .  furosemide (LASIX) 40 MG tablet, , Disp: , Rfl: 0 .  isosorbide mononitrate (ISMO,MONOKET) 10 MG tablet, take  1 tab (10mg ) per tube BID, Disp: 60 tablet, Rfl: 0 .  Nutritional Supplements (FEEDING SUPPLEMENT, JEVITY 1.2 CAL,) LIQD, Place 1,000 mLs into feeding tube continuous. 65cc/hr, Disp: 1500 mL, Rfl: 30 .  sacubitril-valsartan (ENTRESTO) 24-26 MG, Take 1 tablet by mouth 2 (two) times daily., Disp: 60 tablet, Rfl: 10 No current facility-administered medications for this visit.   Facility-Administered Medications Ordered in Other Visits:  .  topical emolient (BIAFINE) emulsion, , Topical, Daily, Kyung Rudd, MD  Allergies: No Known Allergies  Past Medical History, Surgical history, Social history, and Family History were reviewed and updated.  Review of Systems: As stated in the interim history  Physical Exam:  weight is 133 lb (60.3 kg). His oral temperature is 98.3 F (36.8 C). His blood pressure is 117/59 (abnormal) and his pulse is 95. His respiration is 18 and oxygen saturation is 99%.   Thin but well-nourished African-American male.  Head and neck exam shows no ocular or oral lesions.  He has no adenopathy in the neck.  He has an intact gag reflex.  His oral mucosa is moist.  Lungs are clear bilaterally.  Cardiac exam regular rate and rhythm with no murmurs, rubs or bruits.  Abdomen is soft.  His feeding tube is intact.  There  is no fluid wave.  There is no palpable liver or spleen tip.  Back exam shows no tenderness over the spine, ribs or hips.  Extremities shows no clubbing, cyanosis or edema.  Neurological exam shows no focal neurological deficits.  Skin exam shows no rashes, ecchymoses or petechia.   Lab Results  Component Value Date   WBC 4.4 07/30/2017   HGB 13.9 07/30/2017   HCT 42.3 07/30/2017   MCV 89 07/30/2017   PLT 191 07/30/2017     Chemistry      Component Value Date/Time   NA 139 07/30/2017 0926   NA 137 03/10/2017 1000   K 3.9 07/30/2017 0926   K 4.3 03/10/2017 1000   CL 96 (L) 07/30/2017 0926   CO2 33 07/30/2017 0926   CO2 29 03/10/2017 1000   BUN 17  07/30/2017 0926   BUN 14.7 03/10/2017 1000   CREATININE 1.4 (H) 07/30/2017 0926   CREATININE 1.1 03/10/2017 1000      Component Value Date/Time   CALCIUM 9.6 07/30/2017 0926   CALCIUM 10.0 03/10/2017 1000   ALKPHOS 49 07/30/2017 0926   ALKPHOS 51 03/10/2017 1000   AST 31 07/30/2017 0926   AST 23 03/10/2017 1000   ALT 31 07/30/2017 0926   ALT 18 03/10/2017 1000   BILITOT 0.60 07/30/2017 0926   BILITOT 0.41 03/10/2017 1000         Impression and Plan: Collin Young is 66 year old gentleman. He had a localized recurrence of carcinoma of the left pyriform sinus. He underwent concurrent chemotherapy and radiation therapy. He finished his treatment back in April of 2015.   Has a history of squamous cell carcinoma of the esophagus. He was treated with radiation chemotherapy. This was completed back in 2010.  I will go ahead and see about having interventional radiology remove the feeding tube.  Again I am not sure why he needs this at this point.  I do not see any evidence of aspiration.  Again he swallowed a whole bottle of cherry juice for me without difficulties.  I see no evidence of recurrence of his esophageal cancer or piriform sinus cancer.  I truly believe that he is cured of both.  For right now, we will go ahead and plan to get him back in the springtime.  We will try to get him through the holidays and winter time.  I spent a good 25-30 minutes with him today.  I was testing him thoroughly for any evidence of aspiration so that we can take out the feeding tube.  Again I could not find any evidence that he aspirates.      [name], MD 11/30/201810:22 AM

## 2017-07-30 NOTE — Progress Notes (Signed)
Cardiology Office Note   Date:  07/31/2017   ID:  Collin Young, DOB 1951/03/05, MRN [id number]  PCP:  [name], MD  Cardiologist:   Skeet Latch, MD   No chief complaint on file.     History of Present Illness: Collin Young is a 66 y.o. 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  Collin Young was admitted 12/2016 with progressive shortness of breath.  BNP was >2000.  Echo revealed LVEF 10-15% with diffuse hypokinesis, moderate MR and moderate AR.  PASP was 49 mmHg.  He underwent left heart catheterization and was found to have 100% proximal LCx with L-->L collaterals, 99% prox-mid RCA and otherwise mild-moderate CAD.  There was consideration of RCA PCI.  However, this would have required hemodynamic support and was deferred given his frailty and comorbidities.  He was treated with milrinone and IV lasix.  Milrinone was weaned prior to discharge.  During that hospitalization he was also treated for MSSA bacteremia.  He was unable to get a TEE due to his prior cancer.  His Port was removed and a PICC line was placed.   He had a  PEG tube placed due to frank aspiration.  At his last appointment Collin Young was referred for an echo 05/2017 that showed a significant improvement in his LVEF to 45-50% and grade 1 diastolic dysfunction.  There was moderate aortic regurgitation.    Since his last appointment Collin Young has been doing well. He continues to work [employer] and has no exertional symptoms.  He has no chest pain or shortness of breath.  He also denies lower extremity edema, orthopnea or PND.  He still has a g tube in place but has been eating food.  He only uses the tube for his pills.  He denies aspiration, though he does occasionallyc cough when eating. He is hoping to get the tube out  soon.  For the last week he has been struggling with a cough and sinus congestion.  He denies fever or chills.  He saw his PCP yesterday and had a shot of IV antibiotics.  He reports that he is already starting to get better.   Past Medical History:  Diagnosis Date  . Cancer [facility])    Esophagus- radiation 2011  . GERD (gastroesophageal reflux disease)    takes zantac prn  . Headache   . History of radiation therapy 10/19/2013-12/05/2013   pyriform sinus 69.96 Gy/55fx  . HOH (hard of hearing)   . Hyperlipemia   . Hypertension   . Seizures ([place])    alcohol withdrawls- 2001    Past Surgical History:  Procedure Laterality Date  . CARDIOVASCULAR STRESS TEST  10/09/09   normal nuclearr stress test, EF 57% [name])  . IR GASTROSTOMY TUBE MOD SED  01/13/2017  . IR PATIENT EVAL TECH 0-60 MINS  03/25/2017  . IR REMOVAL TUN ACCESS W/ PORT W/O FL MOD SED  01/15/2017  . LARYNGOSCOPY N/A 09/15/2013   Procedure: LARYNGOSCOPY;  Surgeon: [name], MD;  Location: [facility];  Service: ENT;  Laterality: N/A;  direct laryngoscopy with biopsy and esophagoscopy  . NO PAST SURGERIES    . RIGHT/LEFT HEART CATH AND CORONARY ANGIOGRAPHY N/A 01/05/2017   Procedure: Right/Left Heart Cath and Coronary Angiography;  Surgeon: [name], MD;  Location:  [facility];  Service: Cardiovascular;  Laterality: N/A;     Current Outpatient Medications  Medication Sig Dispense Refill  . aspirin 81 MG chewable tablet Place 1 tablet (81 mg total) into feeding tube daily. 30 tablet 0  . atorvastatin (LIPITOR) 40 MG tablet Take 1 tablet (40 mg total) by mouth daily. 30 tablet 5  . carvedilol (COREG) 6.25 MG tablet Place 1 tablet (6.25 mg total) into feeding tube 2 (two) times daily with a meal. 60 tablet 0  . cephALEXin (KEFLEX) 250 MG capsule take 1 capsule by mouth four times a day  0  . fentaNYL (DURAGESIC - DOSED MCG/HR) 12 MCG/HR Place 1 patch every 3 days to the skin 10 patch 0  . furosemide (LASIX) 40  MG tablet   0  . isosorbide mononitrate (ISMO,MONOKET) 10 MG tablet take 1 tab (10mg ) per tube BID 60 tablet 0  . Nutritional Supplements (FEEDING SUPPLEMENT, JEVITY 1.2 CAL,) LIQD Place 1,000 mLs into feeding tube continuous. 65cc/hr 1500 mL 30  . sacubitril-valsartan (ENTRESTO) 24-26 MG Take 1 tablet by mouth 2 (two) times daily. 60 tablet 10   No current facility-administered medications for this visit.    Facility-Administered Medications Ordered in Other Visits  Medication Dose Route Frequency Provider Last Rate Last Dose  . topical emolient (BIAFINE) emulsion   Topical Daily Kyung Rudd, MD        Allergies:   Patient has no known allergies.    Social History:  The patient  reports that he quit smoking about 17 years ago. His smoking use included cigarettes. He started smoking about 56 years ago. He has a 40.00 pack-year smoking history. He quit smokeless tobacco use about 17 years ago. His smokeless tobacco use included chew. He reports that he does not drink alcohol or use drugs.   Family History:  The patient's family history includes Breast cancer in his mother.    ROS:  Please see the history of present illness.   Otherwise, review of systems are positive for none.   All other systems are reviewed and negative.    PHYSICAL EXAM: VS:  BP 130/62   Pulse 83   Ht 5\' 6"  (1.676 m)   Wt 126 lb 12.8 oz (57.5 kg)   BMI 20.47 kg/m  , BMI Body mass index is 20.47 kg/m. GENERAL:  Well appearing.  No acute distress.  HEENT: Pupils equal round and reactive, fundi not visualized, oral mucosa unremarkable NECK:  No jugular venous distention, waveform within normal limits, carotid upstroke brisk and symmetric, no bruits, no thyromegaly LYMPHATICS:  No cervical adenopathy LUNGS:  Clear to auscultation bilaterally HEART:  RRR.  PMI not displaced or sustained,S1 and S2 within normal limits, no S3, no S4, no clicks, no rubs, II/VI diastolic murmur at the LUSB ABD:  Flat, positive bowel  sounds normal in frequency in pitch, no bruits, no rebound, no guarding, no midline pulsatile mass, no hepatomegaly, no splenomegaly EXT:  2 plus pulses throughout, no edema, no cyanosis no clubbing SKIN:  No rashes no nodules NEURO:  Cranial nerves II through XII grossly intact, motor grossly intact throughout PSYCH:  Cognitively intact, oriented to person place and time   EKG:  EKG is ordered today. Sinus rhythm.  Rate 83 bpm.  Lateral TWI.   Echo 05/06/17: Study Conclusions  - Left ventricle: Diffuse hypokinesis slightly worse in inferior   wall. The cavity size was mildly dilated. Systolic function was   mildly reduced. The estimated ejection  fraction was in the range   of 45% to 50%. Doppler parameters are consistent with abnormal   left ventricular relaxation (grade 1 diastolic dysfunction). - Aortic valve: Severely calcified non coronary cusp. There was   moderate regurgitation. - Mitral valve: There was mild regurgitation. - Left atrium: The atrium was mildly dilated. - Atrial septum: No defect or patent foramen ovale was identified.  Echo 01/03/17: Study Conclusions  - Left ventricle: The cavity size was normal. Wall thickness was   normal. The estimated ejection fraction was in the range of 10%   to 15%. Diffuse hypokinesis. DIastolic function is abnormal,   indeterminant grade. - Aortic valve: There was moderate regurgitation. Valve area (VTI):   1.08 cm^2. Valve area (Vmax): 1.5 cm^2. Valve area (Vmean): 1.5   cm^2. - Mitral valve: There was moderate regurgitation. - Left atrium: The atrium was mildly dilated. - Right ventricle: Systolic function was mildly to moderately   reduced. - Tricuspid valve: There was moderate regurgitation. - Pulmonary arteries: Systolic pressure was moderately increased.   PA peak pressure: 49 mm Hg (S).  LHC 01/05/17:  Prox RCA to Mid RCA lesion, 99 %stenosed.  Mid RCA lesion, 30 %stenosed.  Dist RCA lesion, 40 %stenosed.  RPDA  lesion, 20 %stenosed.  Prox Cx lesion, 100 %stenosed.  Ost Ramus to Ramus lesion, 50 %stenosed.  Ost 3rd Diag to 3rd Diag lesion, 30 %stenosed.  Mid LAD lesion, 40 %stenosed.  Ost LAD to Prox LAD lesion, 20 %stenosed.  Ost LM to LM lesion, 40 %stenosed.  Hemodynamic findings consistent with mild pulmonary hypertension.   1. Severe calcific disease in the proximal to mid RCA 2. 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 Moderate left main and mid LAD stenosis 4. Elevated filling pressures  Recent Labs: 01/08/2017: Magnesium 2.7 01/14/2017: B Natriuretic Peptide >4,500.0 01/27/2017: NT-Pro BNP 11,301 03/10/2017: TSH 2.085 07/30/2017: ALT(SGPT) 31; BUN, Bld 17; Creat 1.4; HGB 13.9; Platelets 191; Potassium 3.9; Sodium 139    Lipid Panel    Component Value Date/Time   CHOL 167 04/30/2017 0828   TRIG 69 04/30/2017 0828   HDL 61 04/30/2017 0828   CHOLHDL 2.7 04/30/2017 0828   LDLCALC 92 04/30/2017 0828      Wt Readings from Last 3 Encounters:  07/30/17 126 lb 12.8 oz (57.5 kg)  07/30/17 133 lb (60.3 kg)  06/18/17 125 lb (56.7 kg)      ASSESSMENT AND PLAN:  # Chronic systolic and diastolic heart failure:  # Moderate MR: # Moderate AR:  LVEF significantly improved from 10-15% to 45-50%.  He is doing extremely well and is asymptomatic. Continue Entresto, carvedilol, and Imdur.  Likely ischemic cardiomyopathy.   # Obstructive CAD:  # Hyperlipidemia:  Medically managed due to acute illness at the time of diagnosis.  100% LCx and 99% RCA.  The RCA would require hemodynamic support.  He has no chest pain so we will continue medical management for now.  Continue aspirin, carvedilol, Imdur and atorvastatin.  LDL was 92 03/2017 so atorvastatin was increased to 80mg .  Repeat lipids and CMP at follow up.  Current medicines are reviewed at length with the patient today.  The patient does not have concerns regarding medicines.  The following changes have been made:   none  Labs/ tests ordered today include:  No orders of the defined types were placed in this encounter.    Disposition:   FU with Collin Lehenbauer C. Oval Linsey, MD, [facility] in 6 months.   This note  was written with the assistance of speech recognition software.  Please excuse any transcriptional errors.  Signed, Ashelyn Mccravy C. Oval Linsey, MD, [facility]  07/31/2017 4:08 PM    [facility]

## 2017-07-31 ENCOUNTER — Encounter: Payer: Self-pay | Admitting: Cardiovascular Disease

## 2017-08-04 ENCOUNTER — Other Ambulatory Visit (HOSPITAL_COMMUNITY): Payer: Self-pay

## 2017-08-05 ENCOUNTER — Other Ambulatory Visit: Payer: Self-pay | Admitting: *Deleted

## 2017-08-05 DIAGNOSIS — E039 Hypothyroidism, unspecified: Secondary | ICD-10-CM

## 2017-08-05 DIAGNOSIS — D508 Other iron deficiency anemias: Secondary | ICD-10-CM

## 2017-08-05 DIAGNOSIS — I255 Ischemic cardiomyopathy: Secondary | ICD-10-CM

## 2017-08-05 DIAGNOSIS — C12 Malignant neoplasm of pyriform sinus: Secondary | ICD-10-CM

## 2017-08-05 MED ORDER — FENTANYL 12 MCG/HR TD PT72: MEDICATED_PATCH | TRANSDERMAL | 0 refills | Status: DC

## 2017-08-06 MED FILL — fentaNYL 12 MCG/HR PT72: 12 | 30 days supply | Qty: 10 | Fill #0

## 2017-09-06 ENCOUNTER — Other Ambulatory Visit: Payer: Self-pay | Admitting: *Deleted

## 2017-09-06 DIAGNOSIS — D508 Other iron deficiency anemias: Secondary | ICD-10-CM

## 2017-09-06 DIAGNOSIS — C12 Malignant neoplasm of pyriform sinus: Secondary | ICD-10-CM

## 2017-09-06 DIAGNOSIS — I255 Ischemic cardiomyopathy: Secondary | ICD-10-CM

## 2017-09-06 DIAGNOSIS — E039 Hypothyroidism, unspecified: Secondary | ICD-10-CM

## 2017-09-06 MED ORDER — FENTANYL 12 MCG/HR TD PT72: MEDICATED_PATCH | TRANSDERMAL | 0 refills | Status: DC

## 2017-10-13 ENCOUNTER — Other Ambulatory Visit: Payer: Self-pay | Admitting: *Deleted

## 2017-10-13 ENCOUNTER — Other Ambulatory Visit: Payer: Self-pay | Admitting: Hematology & Oncology

## 2017-10-13 DIAGNOSIS — I255 Ischemic cardiomyopathy: Secondary | ICD-10-CM

## 2017-10-13 DIAGNOSIS — E039 Hypothyroidism, unspecified: Secondary | ICD-10-CM

## 2017-10-13 DIAGNOSIS — C12 Malignant neoplasm of pyriform sinus: Secondary | ICD-10-CM

## 2017-10-13 DIAGNOSIS — D508 Other iron deficiency anemias: Secondary | ICD-10-CM

## 2017-10-13 MED ORDER — FENTANYL 12 MCG/HR TD PT72: MEDICATED_PATCH | TRANSDERMAL | 0 refills | Status: DC

## 2017-10-29 ENCOUNTER — Inpatient Hospital Stay: Payer: Medicare Other

## 2017-10-29 ENCOUNTER — Inpatient Hospital Stay: Payer: Medicare Other | Attending: Hematology & Oncology | Admitting: Hematology & Oncology

## 2017-10-29 VITALS — BP 142/72 | HR 82 | Temp 97.9°F | Resp 16 | Wt 134.2 lb

## 2017-10-29 DIAGNOSIS — Z8501 Personal history of malignant neoplasm of esophagus: Secondary | ICD-10-CM | POA: Diagnosis not present

## 2017-10-29 DIAGNOSIS — Z79899 Other long term (current) drug therapy: Secondary | ICD-10-CM | POA: Insufficient documentation

## 2017-10-29 DIAGNOSIS — C12 Malignant neoplasm of pyriform sinus: Secondary | ICD-10-CM | POA: Insufficient documentation

## 2017-10-29 DIAGNOSIS — E032 Hypothyroidism due to medicaments and other exogenous substances: Secondary | ICD-10-CM

## 2017-10-29 LAB — CBC WITH DIFFERENTIAL (CANCER CENTER ONLY)
BASOS ABS: 0 10*3/uL (ref 0.0–0.1)
BASOS PCT: 0 %
EOS ABS: 0.1 10*3/uL (ref 0.0–0.5)
EOS PCT: 3 %
HCT: 44.2 % (ref 38.7–49.9)
Hemoglobin: 14.4 g/dL (ref 13.0–17.1)
Lymphocytes Relative: 24 %
Lymphs Abs: 1 10*3/uL (ref 0.9–3.3)
MCH: 28.7 pg (ref 28.0–33.4)
MCHC: 32.6 g/dL (ref 32.0–35.9)
MCV: 88.2 fL (ref 82.0–98.0)
MONO ABS: 0.7 10*3/uL (ref 0.1–0.9)
Monocytes Relative: 17 %
Neutro Abs: 2.3 10*3/uL (ref 1.5–6.5)
Neutrophils Relative %: 56 %
PLATELETS: 250 10*3/uL (ref 145–400)
RBC: 5.01 MIL/uL (ref 4.20–5.70)
RDW: 13.8 % (ref 11.1–15.7)
WBC: 4.1 10*3/uL (ref 4.0–10.0)

## 2017-10-29 LAB — CMP (CANCER CENTER ONLY)
ALK PHOS: 52 U/L (ref 40–150)
ALT: 34 U/L (ref 0–55)
AST: 35 U/L — AB (ref 5–34)
Albumin: 4 g/dL (ref 3.5–5.0)
Anion gap: 11 (ref 3–11)
BILIRUBIN TOTAL: 0.3 mg/dL (ref 0.2–1.2)
BUN: 27 mg/dL — AB (ref 7–26)
CALCIUM: 10 mg/dL (ref 8.4–10.4)
CO2: 28 mmol/L (ref 22–29)
CREATININE: 1.16 mg/dL (ref 0.70–1.30)
Chloride: 96 mmol/L — ABNORMAL LOW (ref 98–109)
GFR, Est AFR Am: >60 mL/min (ref >60)
Glucose, Bld: 99 mg/dL (ref 70–140)
POTASSIUM: 4.5 mmol/L (ref 3.5–5.1)
Sodium: 135 mmol/L — ABNORMAL LOW (ref 136–145)
TOTAL PROTEIN: 8.6 g/dL — AB (ref 6.4–8.3)

## 2017-10-29 LAB — TSH: TSH: 1.444 u[IU]/mL (ref 0.320–4.118)

## 2017-10-29 NOTE — Progress Notes (Signed)
Hematology and Oncology Follow Up Visit  HANISH LARAIA [id number] 26-Feb-1951 67 y.o. 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  Bredeson is back for followup.  He still has his feeding tube in.  His daughter does not want to have this come out.  She says that he really is not taking all that much and through his mouth.  He feels well.  He has had no pain.  He is on a fentanyl patch which is helping him quite a bit.  He is had no bleeding.  There is been no change in bowel or bladder habits.  He has had no diarrhea or constipation.  There is been no fever.  He has had no problems with the flu.  He has had no cough or shortness of breath.  Overall, I would say that his performance status is ECOG 2.   Medications:  Current Outpatient Medications:  .  aspirin 81 MG chewable tablet, Place 1 tablet (81 mg total) into feeding tube daily., Disp: 30 tablet, Rfl: 0 .  atorvastatin (LIPITOR) 40 MG tablet, Take 1 tablet (40 mg total) by mouth daily., Disp: 30 tablet, Rfl: 5 .  carvedilol (COREG) 6.25 MG tablet, Place 1 tablet (6.25 mg total) into feeding tube 2 (two) times daily with a meal., Disp: 60 tablet, Rfl: 0 .  cephALEXin (KEFLEX) 250 MG capsule, take 1 capsule by mouth four times a day, Disp: , Rfl: 0 .  fentaNYL (DURAGESIC - DOSED MCG/HR) 12 MCG/HR, Place 1 patch every 3 days to the skin, Disp: 10 patch, Rfl: 0 .  fentaNYL (DURAGESIC - DOSED MCG/HR) 12 MCG/HR, place 1 patch every 3 days ONTO THE SKIN, Disp: 10 patch, Rfl: 0 .  furosemide (LASIX) 40 MG tablet, , Disp: , Rfl: 0 .  isosorbide mononitrate (ISMO,MONOKET) 10 MG tablet, take 1 tab (10mg ) per tube BID, Disp: 60 tablet, Rfl: 0 .  Nutritional Supplements (FEEDING SUPPLEMENT, JEVITY 1.2 CAL,) LIQD, Place 1,000 mLs into  feeding tube continuous. 65cc/hr, Disp: 1500 mL, Rfl: 30 .  sacubitril-valsartan (ENTRESTO) 24-26 MG, Take 1 tablet by mouth 2 (two) times daily., Disp: 60 tablet, Rfl: 10 No current facility-administered medications for this visit.   Facility-Administered Medications Ordered in Other Visits:  .  topical emolient (BIAFINE) emulsion, , Topical, Daily, Kyung Rudd, MD  Allergies: No Known Allergies  Past Medical History, Surgical history, Social history, and Family History were reviewed and updated.  Review of Systems: Review of Systems  Constitutional: Negative.   HENT: Negative.   Eyes: Negative.   Respiratory: Negative.   Cardiovascular: Negative.   Gastrointestinal: Negative.   Genitourinary: Negative.   Musculoskeletal: Negative.   Skin: Negative.   Neurological: Negative.   Endo/Heme/Allergies: Negative.   Psychiatric/Behavioral: Negative.      Physical Exam:  weight is 134 lb 4 oz (60.9 kg). His oral temperature is 97.9 F (36.6 C). His blood pressure is 142/72 (abnormal) and his pulse is 82. His respiration is 16 and oxygen saturation is 99%.   Physical Exam  Constitutional: He is oriented to person, place, and time.  HENT:  Head: Normocephalic and atraumatic.  Mouth/Throat: Oropharynx is clear and moist.  Eyes: EOM are normal. Pupils are equal, round, and reactive to light.  Neck: Normal range of motion.  Cardiovascular: Normal rate, regular rhythm and normal heart sounds.  Pulmonary/Chest: Effort normal and breath sounds normal.  Abdominal: Soft. Bowel sounds are normal.  He has a feeding tube in place.  There is no erythema or exudate from the feeding tube entry site.  Musculoskeletal: Normal range of motion. He exhibits no edema, tenderness or deformity.  Lymphadenopathy:    He has no cervical adenopathy.  Neurological: He is alert and oriented to person, place, and time.  Skin: Skin is warm and dry. No rash noted. No erythema.  Psychiatric: He has a normal  mood and affect. His behavior is normal. Judgment and thought content normal.  Vitals reviewed.   Lab Results  Component Value Date   WBC 4.1 10/29/2017   HGB 13.9 07/30/2017   HCT 44.2 10/29/2017   MCV 88.2 10/29/2017   PLT 250 10/29/2017     Chemistry      Component Value Date/Time   NA 139 07/30/2017 0926   NA 137 03/10/2017 1000   K 3.9 07/30/2017 0926   K 4.3 03/10/2017 1000   CL 96 (L) 07/30/2017 0926   CO2 33 07/30/2017 0926   CO2 29 03/10/2017 1000   BUN 17 07/30/2017 0926   BUN 14.7 03/10/2017 1000   CREATININE 1.4 (H) 07/30/2017 0926   CREATININE 1.1 03/10/2017 1000      Component Value Date/Time   CALCIUM 9.6 07/30/2017 0926   CALCIUM 10.0 03/10/2017 1000   ALKPHOS 49 07/30/2017 0926   ALKPHOS 51 03/10/2017 1000   AST 31 07/30/2017 0926   AST 23 03/10/2017 1000   ALT 31 07/30/2017 0926   ALT 18 03/10/2017 1000   BILITOT 0.60 07/30/2017 0926   BILITOT 0.41 03/10/2017 1000         Impression and Plan: Mr. Beever is 67 year old gentleman. He had a localized recurrence of carcinoma of the left pyriform sinus. He underwent concurrent chemotherapy and radiation therapy. He finished his treatment back in April of 2015.   Has a history of squamous cell carcinoma of the esophagus. He was treated with radiation chemotherapy. This was completed back in 2010.  At this point, I think we can get him back in 4 months.  Everything looks fine.  I do not see that we have to do any scans on him.  His daughter will really dictate whether or not his feeding tube will ever come out.       [name], MD 3/1/201911:10 AM

## 2017-11-01 ENCOUNTER — Encounter: Payer: Self-pay | Admitting: Speech Pathology

## 2017-11-01 NOTE — Therapy (Signed)
SPEECH THERAPY DISCHARGE SUMMARY  Visits from Start of Care: 4  Current functional level related to goals / functional outcomes: See goals below.   Remaining deficits: Pt continues to exhibit severe pharyngeal dysphagia as seen on repeat MBS 06/07/17. Progress limited due to severity of deficits as well as pt's attendance of ST; he canceled or no-showed for several visits.    Education / Equipment: Swallowing HEP, aspiration precautions, s/sx of aspiration PNA provided.   Plan: Patient agrees to discharge.  Patient goals were not met. Patient is being discharged due to not returning since the last visit.  ?????    SLP Long Term Goals - 11/01/17 1016      SLP LONG TERM GOAL #1   Title  pt will demo HEP with modified independence    Status  Not Met      SLP LONG TERM GOAL #2   Title  Pt will verbalize precautions for free water protocol with min A    Status  Not Met      SLP LONG TERM GOAL #3   Title  pt will tell SLP 3 signs of aspiration PNA with modified independence    Status  Not [place] [address] [place], [place], [address] Phone: [phone]   Fax:  [phone]  Patient Details  Name: Collin Young MRN: [id number] Date of Birth: [birth year] Referring Provider:  No ref. provider found  Encounter Date: 11/01/2017  Deneise Lever, [place], [place] 11/01/2017, 10:17 AM  [facility] [address] [place], [place], [address] Phone: [phone]   Fax:  [phone]

## 2017-11-18 ENCOUNTER — Other Ambulatory Visit: Payer: Self-pay | Admitting: *Deleted

## 2017-11-18 DIAGNOSIS — C12 Malignant neoplasm of pyriform sinus: Secondary | ICD-10-CM

## 2017-11-18 DIAGNOSIS — I255 Ischemic cardiomyopathy: Secondary | ICD-10-CM

## 2017-11-18 DIAGNOSIS — D508 Other iron deficiency anemias: Secondary | ICD-10-CM

## 2017-11-18 DIAGNOSIS — E039 Hypothyroidism, unspecified: Secondary | ICD-10-CM

## 2017-11-18 MED ORDER — FENTANYL 12 MCG/HR TD PT72: MEDICATED_PATCH | TRANSDERMAL | 0 refills | Status: DC

## 2018-01-06 ENCOUNTER — Other Ambulatory Visit: Payer: Self-pay

## 2018-01-06 ENCOUNTER — Encounter (HOSPITAL_COMMUNITY): Payer: Self-pay | Admitting: *Deleted

## 2018-01-06 ENCOUNTER — Emergency Department (HOSPITAL_COMMUNITY)
Admission: EM | Admit: 2018-01-06 | Discharge: 2018-01-07 | Disposition: A | Discharge status: Home / Self Care | Payer: Medicare Other | Attending: Emergency Medicine | Admitting: Emergency Medicine

## 2018-01-06 ENCOUNTER — Emergency Department (HOSPITAL_COMMUNITY): Payer: Medicare Other

## 2018-01-06 DIAGNOSIS — Z7982 Long term (current) use of aspirin: Secondary | ICD-10-CM | POA: Diagnosis not present

## 2018-01-06 DIAGNOSIS — I5041 Acute combined systolic (congestive) and diastolic (congestive) heart failure: Secondary | ICD-10-CM | POA: Insufficient documentation

## 2018-01-06 DIAGNOSIS — Z87891 Personal history of nicotine dependence: Secondary | ICD-10-CM | POA: Insufficient documentation

## 2018-01-06 DIAGNOSIS — I10 Essential (primary) hypertension: Secondary | ICD-10-CM | POA: Insufficient documentation

## 2018-01-06 DIAGNOSIS — I2510 Atherosclerotic heart disease of native coronary artery without angina pectoris: Secondary | ICD-10-CM | POA: Diagnosis not present

## 2018-01-06 DIAGNOSIS — K5900 Constipation, unspecified: Secondary | ICD-10-CM | POA: Insufficient documentation

## 2018-01-06 DIAGNOSIS — R1033 Periumbilical pain: Secondary | ICD-10-CM | POA: Diagnosis present

## 2018-01-06 DIAGNOSIS — Z79899 Other long term (current) drug therapy: Secondary | ICD-10-CM | POA: Diagnosis not present

## 2018-01-06 DIAGNOSIS — R109 Unspecified abdominal pain: Secondary | ICD-10-CM

## 2018-01-06 LAB — CBC WITH DIFFERENTIAL/PLATELET
Basophils Absolute: 0 10*3/uL (ref 0.0–0.1)
Basophils Relative: 0 %
EOS ABS: 0.2 10*3/uL (ref 0.0–0.7)
EOS PCT: 3 %
HCT: 46.3 % (ref 39.0–52.0)
Hemoglobin: 15.3 g/dL (ref 13.0–17.0)
LYMPHS ABS: 1.1 10*3/uL (ref 0.7–4.0)
Lymphocytes Relative: 17 %
MCH: 28.9 pg (ref 26.0–34.0)
MCHC: 33 g/dL (ref 30.0–36.0)
MCV: 87.4 fL (ref 78.0–100.0)
Monocytes Absolute: 0.9 10*3/uL (ref 0.1–1.0)
Monocytes Relative: 15 %
NEUTROS PCT: 65 %
Neutro Abs: 4 10*3/uL (ref 1.7–7.7)
PLATELETS: 233 10*3/uL (ref 150–400)
RBC: 5.3 MIL/uL (ref 4.22–5.81)
RDW: 13.7 % (ref 11.5–15.5)
WBC: 6.2 10*3/uL (ref 4.0–10.5)

## 2018-01-06 LAB — I-STAT CG4 LACTIC ACID, ED: LACTIC ACID, VENOUS: 1.1 mmol/L (ref 0.5–1.9)

## 2018-01-06 MED ORDER — MAGNESIUM CITRATE PO SOLN
1.0000 | Freq: Once | ORAL | Status: AC
  Administered 2018-01-06: 1 via ORAL
  Filled 2018-01-06: qty 296

## 2018-01-06 MED ORDER — METOCLOPRAMIDE HCL 5 MG/ML IJ SOLN
5.0000 mg | Freq: Once | INTRAMUSCULAR | Status: AC
  Administered 2018-01-06: 5 mg via INTRAVENOUS
  Filled 2018-01-06: qty 2

## 2018-01-06 MED ORDER — MILK AND MOLASSES ENEMA
1.0000 | Freq: Once | RECTAL | Status: AC
  Administered 2018-01-07: 250 mL via RECTAL
  Filled 2018-01-06: qty 250

## 2018-01-06 NOTE — ED Provider Notes (Signed)
[facility] EMERGENCY DEPARTMENT Provider Note   CSN: [id number] Arrival date & time: 01/06/18  2115     History   Chief Complaint Chief Complaint  Patient presents with  . Abdominal Pain    HPI Collin Young is a 67 y.o. male.  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  Patient states that he was instructed not to take any food p.o. given his dysphasia and is to be fed through his G-tube, however patient has not been compliant with these instructions.  Patient states he had a bowel movement on Monday but since that time is had worsening abdominal distention and discomfort.  Patient denies any nausea or vomiting.  Patient denies fevers chills.  Past Medical History:  Diagnosis Date  . Cancer [facility])    Esophagus- radiation 2011  . GERD (gastroesophageal reflux disease)    takes zantac prn  . Headache   . History of radiation therapy 10/19/2013-12/05/2013   pyriform sinus 69.96 Gy/25fx  . HOH (hard of hearing)   . Hyperlipemia   . Hypertension   . Seizures ([place])    alcohol withdrawls- 2001    Patient Active Problem List   Diagnosis Date Noted  . SOB (shortness of breath)   . Angina decubitus ([place])   . Coronary artery disease of bypass graft of native heart with stable angina pectoris ([place])   . Aspiration pneumonia of both lower lobes due to gastric secretions ([place]) 01/11/2017  . Staphylococcus aureus bacteremia 01/11/2017  . Encounter for feeding tube placement   . Dysphagia   . Palliative care by specialist   . Acute respiratory failure with hypoxia ([place])   . Acute congestive heart failure ([place])   . Ischemic cardiomyopathy   . Acute pulmonary edema (HCC)   . Congestive dilated cardiomyopathy ([place])   . Acute combined systolic and  diastolic heart failure ([place]) 01/02/2017  . AKI (acute kidney injury) ([place]) 01/02/2017  . Malignant neoplasm of pyriform sinus ([place]) 09/28/2013  . 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 NONSPECIFIC ABN FINDING RAD & OTH EXAM GI TRACT 05/14/2009    Past Surgical History:  Procedure Laterality Date  . CARDIOVASCULAR STRESS TEST  10/09/09   normal nuclearr stress test, EF 57% [name])  . IR GASTROSTOMY TUBE MOD SED  01/13/2017  . IR PATIENT EVAL TECH 0-60 MINS  03/25/2017  . IR REMOVAL TUN ACCESS W/ PORT W/O FL MOD SED  01/15/2017  . LARYNGOSCOPY N/A 09/15/2013   Procedure: LARYNGOSCOPY;  Surgeon: [name], MD;  Location: [place];  Service: ENT;  Laterality: N/A;  direct laryngoscopy with biopsy and esophagoscopy  . NO PAST SURGERIES    . RIGHT/LEFT HEART CATH AND CORONARY ANGIOGRAPHY N/A 01/05/2017   Procedure: Right/Left Heart Cath and Coronary Angiography;  Surgeon: [name], MD;  Location: [place] CV LAB;  Service: Cardiovascular;  Laterality: N/A;        Home Medications    Prior to Admission medications   Medication Sig Start Date End Date Taking? Authorizing Provider  aspirin 81 MG chewable tablet Place 1 tablet (81 mg total) into feeding tube daily. 01/18/17   Patrecia Pour, MD  atorvastatin (LIPITOR) 40 MG tablet Take 1 tablet (40 mg total) by mouth daily. 05/24/17   Oval Linsey,  Tiffany, MD  carvedilol (COREG) 6.25 MG tablet Place 1 tablet (6.25 mg total) into feeding tube 2 (two) times daily with a meal. 01/17/17   Patrecia Pour, MD  cephALEXin (KEFLEX) 250 MG capsule take 1 capsule by mouth four times a day 07/29/17   [provider]  fentaNYL (DURAGESIC - DOSED MCG/HR) 12 MCG/HR place 1 patch every 3 days ONTO THE SKIN 11/18/17   [place], Holli Humbles, NP  furosemide (LASIX) 40 MG tablet  05/07/17   [provider]  isosorbide mononitrate (ISMO,MONOKET) 10 MG tablet take 1 tab (10mg ) per tube BID 01/17/17   Patrecia Pour, MD  Nutritional Supplements  (FEEDING SUPPLEMENT, JEVITY 1.2 CAL,) LIQD Place 1,000 mLs into feeding tube continuous. 65cc/hr 01/17/17   Patrecia Pour, MD  sacubitril-valsartan (ENTRESTO) 24-26 MG Take 1 tablet by mouth 2 (two) times daily. 02/12/17   Skeet Latch, MD    Family History Family History  Problem Relation Age of Onset  . Breast cancer Mother     Social History Social History   Tobacco Use  . Smoking status: Former Smoker    Packs/day: 1.00    Years: 40.00    Pack years: 40.00    Types: Cigarettes    Start date: 11/08/1960    Last attempt to quit: 09/01/1999    Years since quitting: 18.3  . Smokeless tobacco: Former Systems developer    Types: Chew    Quit date: 09/01/1999  . Tobacco comment: quit in 2011  Substance Use Topics  . Alcohol use: No    Alcohol/week: 0.0 oz    Comment: quit in 2001  . Drug use: No    Comment: back in the day used cocaine,alcohol, marijuana     Allergies   Patient has no known allergies.   Review of Systems Review of Systems  Constitutional: Negative for chills and fever.  Gastrointestinal: Positive for abdominal distention, abdominal pain and constipation. Negative for nausea and vomiting.  All other systems reviewed and are negative.    Physical Exam Updated Vital Signs BP 124/83 (BP Location: Left Arm)   Pulse (!) 54   Temp 98.5 F (36.9 C) (Oral)   Resp 16   SpO2 99%   Physical Exam  Constitutional: He appears well-developed and well-nourished.  HENT:  Head: Normocephalic and atraumatic.  Eyes: Conjunctivae are normal.  Neck: Neck supple.  Cardiovascular: Normal rate and regular rhythm.  No murmur heard. Pulmonary/Chest: Effort normal and breath sounds normal. No respiratory distress.  Abdominal: He exhibits distension. There is tenderness.  Abdomen is firm with diffuse tenderness to palpation.  Patient not peritoneal take.  G-tube site is clean dry and intact.  Patient with hypoactive bowel sounds.  Musculoskeletal: He exhibits no edema.    Neurological: He is alert.  Skin: Skin is warm and dry.  Psychiatric: He has a normal mood and affect.  Nursing note and vitals reviewed.    ED Treatments / Results  Labs (all labs ordered are listed, but only abnormal results are displayed) Labs Reviewed  CBC WITH DIFFERENTIAL/PLATELET  I-STAT CG4 LACTIC ACID, ED  I-STAT CG4 LACTIC ACID, ED    EKG None  Radiology Dg Abdomen 1 View  Result Date: 01/06/2018 CLINICAL DATA:  Abdominal distention with periumbilical and hypogastric abdominal pain yesterday. Pain is below the level of the feeding tube. EXAM: ABDOMEN - 1 VIEW COMPARISON:  01/08/2017 FINDINGS: Percutaneous gastrostomy tube projects over the left upper quadrant. A moderate amount of fecal retention is seen within  the distal ascending and proximal transverse colon. No bowel obstruction is seen. No free air is identified. No organomegaly is noted. Aortoiliac and branch vessel atherosclerosis is seen without definite aneurysm. No acute nor suspicious osseous abnormality. IMPRESSION: Increased colonic stool burden within the distal ascending and proximal transverse colon. No significant small bowel dilatation. There is no free air. Electronically Signed   By: Ashley Royalty M.D.   On: 01/06/2018 22:55    Procedures Procedures (including critical care time)  Medications Ordered in ED Medications  milk and molasses enema (has no administration in time range)  metoCLOPramide (REGLAN) injection 5 mg (5 mg Intravenous Given 01/06/18 2335)  magnesium citrate solution 1 Bottle (1 Bottle Oral Given 01/06/18 2337)     Initial Impression / Assessment and Plan / ED Course  I have reviewed the triage vital signs and the nursing notes.  Pertinent labs & imaging results that were available during my care of the patient were reviewed by me and considered in my medical decision making (see chart for details).     Laboratory work-up largely within normal limits without concerning findings.   Plain film of the abdomen shows large stool burden with nonobstructive bowel gas pattern.  Will give patient magnesium citrate as well as milk of molasses enema.  Care patient handed off to oncoming team please see their documentation for further details.  Final Clinical Impressions(s) / ED Diagnoses   Final diagnoses:  None    ED Discharge Orders    None       Chapman Moss, MD 01/07/18 [phone]

## 2018-01-06 NOTE — ED Provider Notes (Signed)
I saw and evaluated the patient, reviewed the resident's note and I agree with the findings and plan.   EKG Interpretation None     39 67-year-old male who presents for abdominal discomfort and constipation.  Abdominal films show constipation.  Will give magnesium citrate as well as enema and assess for response   Lacretia Leigh, MD 01/06/18 2341

## 2018-01-06 NOTE — ED Notes (Signed)
Nurse starting IV and will draw labs. 

## 2018-01-06 NOTE — ED Notes (Signed)
Patient transported to X-ray 

## 2018-01-06 NOTE — ED Triage Notes (Signed)
Pt arrived from home by EMS for abd pain and constipation. Pt has a PEG tube for tube feedings, is not supposed to be eating whole food but has been. Pt reports diarrhea on Saturday and a small bowel movement on Monday, has not had a BM since Monday. Reports distention in LUQ

## 2018-01-07 LAB — COMPREHENSIVE METABOLIC PANEL
ALBUMIN: 3.9 g/dL (ref 3.5–5.0)
ALT: 22 U/L (ref 17–63)
AST: 30 U/L (ref 15–41)
Alkaline Phosphatase: 43 U/L (ref 38–126)
Anion gap: 9 (ref 5–15)
BUN: 16 mg/dL (ref 6–20)
CO2: 25 mmol/L (ref 22–32)
Calcium: 9.8 mg/dL (ref 8.9–10.3)
Chloride: 99 mmol/L — ABNORMAL LOW (ref 101–111)
Creatinine, Ser: 1.16 mg/dL (ref 0.61–1.24)
GFR calc Af Amer: >60 mL/min (ref >60)
GFR calc non Af Amer: >60 mL/min (ref >60)
GLUCOSE: 99 mg/dL (ref 65–99)
POTASSIUM: 4.7 mmol/L (ref 3.5–5.1)
Sodium: 133 mmol/L — ABNORMAL LOW (ref 135–145)
Total Bilirubin: 0.7 mg/dL (ref 0.3–1.2)
Total Protein: 7.9 g/dL (ref 6.5–8.1)

## 2018-01-07 LAB — LIPASE, BLOOD: LIPASE: 31 U/L (ref 11–51)

## 2018-01-07 NOTE — ED Notes (Signed)
Pt reports having vomited shortly after drinking mag citrate.

## 2018-01-07 NOTE — ED Notes (Signed)
Small amount of stool resulting from enema.

## 2018-01-07 NOTE — ED Notes (Signed)
Recollect blood due to light green hemolyzed

## 2018-01-09 ENCOUNTER — Emergency Department (HOSPITAL_COMMUNITY)
Admission: EM | Admit: 2018-01-09 | Discharge: 2018-01-09 | Disposition: A | Discharge status: Home / Self Care | Payer: Medicare Other | Attending: Emergency Medicine | Admitting: Emergency Medicine

## 2018-01-09 ENCOUNTER — Other Ambulatory Visit: Payer: Self-pay

## 2018-01-09 ENCOUNTER — Emergency Department (HOSPITAL_COMMUNITY): Payer: Medicare Other

## 2018-01-09 ENCOUNTER — Encounter (HOSPITAL_COMMUNITY): Payer: Self-pay | Admitting: Emergency Medicine

## 2018-01-09 DIAGNOSIS — K2960 Other gastritis without bleeding: Secondary | ICD-10-CM | POA: Insufficient documentation

## 2018-01-09 DIAGNOSIS — Z931 Gastrostomy status: Secondary | ICD-10-CM

## 2018-01-09 DIAGNOSIS — I2510 Atherosclerotic heart disease of native coronary artery without angina pectoris: Secondary | ICD-10-CM | POA: Insufficient documentation

## 2018-01-09 DIAGNOSIS — Z7982 Long term (current) use of aspirin: Secondary | ICD-10-CM | POA: Diagnosis not present

## 2018-01-09 DIAGNOSIS — I110 Hypertensive heart disease with heart failure: Secondary | ICD-10-CM | POA: Insufficient documentation

## 2018-01-09 DIAGNOSIS — I5041 Acute combined systolic (congestive) and diastolic (congestive) heart failure: Secondary | ICD-10-CM | POA: Diagnosis not present

## 2018-01-09 DIAGNOSIS — Z87891 Personal history of nicotine dependence: Secondary | ICD-10-CM | POA: Diagnosis not present

## 2018-01-09 DIAGNOSIS — Z79899 Other long term (current) drug therapy: Secondary | ICD-10-CM | POA: Diagnosis not present

## 2018-01-09 LAB — CBC WITH DIFFERENTIAL/PLATELET
BASOS ABS: 0 10*3/uL (ref 0.0–0.1)
Basophils Relative: 0 %
EOS ABS: 0.3 10*3/uL (ref 0.0–0.7)
EOS PCT: 3 %
HCT: 41 % (ref 39.0–52.0)
Hemoglobin: 13.2 g/dL (ref 13.0–17.0)
LYMPHS PCT: 24 %
Lymphs Abs: 1.9 10*3/uL (ref 0.7–4.0)
MCH: 28.2 pg (ref 26.0–34.0)
MCHC: 32.2 g/dL (ref 30.0–36.0)
MCV: 87.6 fL (ref 78.0–100.0)
MONO ABS: 1.2 10*3/uL — AB (ref 0.1–1.0)
Monocytes Relative: 15 %
Neutro Abs: 4.3 10*3/uL (ref 1.7–7.7)
Neutrophils Relative %: 58 %
Platelets: 251 10*3/uL (ref 150–400)
RBC: 4.68 MIL/uL (ref 4.22–5.81)
RDW: 13.6 % (ref 11.5–15.5)
WBC: 7.6 10*3/uL (ref 4.0–10.5)

## 2018-01-09 LAB — COMPREHENSIVE METABOLIC PANEL
ALBUMIN: 3.5 g/dL (ref 3.5–5.0)
ALK PHOS: 38 U/L (ref 38–126)
ALT: 20 U/L (ref 17–63)
AST: 24 U/L (ref 15–41)
Anion gap: 9 (ref 5–15)
BILIRUBIN TOTAL: 0.4 mg/dL (ref 0.3–1.2)
BUN: 26 mg/dL — AB (ref 6–20)
CO2: 30 mmol/L (ref 22–32)
Calcium: 9.3 mg/dL (ref 8.9–10.3)
Chloride: 95 mmol/L — ABNORMAL LOW (ref 101–111)
Creatinine, Ser: 1.23 mg/dL (ref 0.61–1.24)
GFR calc Af Amer: >60 mL/min (ref >60)
GFR calc non Af Amer: 59 mL/min — ABNORMAL LOW (ref >60)
GLUCOSE: 104 mg/dL — AB (ref 65–99)
POTASSIUM: 5 mmol/L (ref 3.5–5.1)
Sodium: 134 mmol/L — ABNORMAL LOW (ref 135–145)
TOTAL PROTEIN: 7.3 g/dL (ref 6.5–8.1)

## 2018-01-09 LAB — I-STAT CG4 LACTIC ACID, ED: Lactic Acid, Venous: 1.34 mmol/L (ref 0.5–1.9)

## 2018-01-09 MED ORDER — GI COCKTAIL ~~LOC~~
30.0000 mL | Freq: Once | ORAL | Status: AC
  Administered 2018-01-09: 30 mL via ORAL
  Filled 2018-01-09: qty 30

## 2018-01-09 MED ORDER — FAMOTIDINE 20 MG PO TABS: 20.0000 mg | ORAL_TABLET | Freq: Every day | ORAL | 0 refills | Status: DC

## 2018-01-09 MED ORDER — SULFAMETHOXAZOLE-TRIMETHOPRIM 800-160 MG PO TABS
1.0000 | ORAL_TABLET | Freq: Once | ORAL | Status: AC
  Administered 2018-01-09: 1 via ORAL
  Filled 2018-01-09: qty 1

## 2018-01-09 MED ORDER — SULFAMETHOXAZOLE-TRIMETHOPRIM 200-40 MG/5ML PO SUSP: 20.0000 mL | Freq: Two times a day (BID) | ORAL | 0 refills | Status: AC

## 2018-01-09 MED ORDER — SULFAMETHOXAZOLE-TRIMETHOPRIM 800-160 MG PO TABS: 1.0000 | ORAL_TABLET | Freq: Two times a day (BID) | ORAL | 0 refills | Status: DC

## 2018-01-09 MED ORDER — IOPAMIDOL (ISOVUE-300) INJECTION 61%
INTRAVENOUS | Status: AC
  Administered 2018-01-09: 50 mL via GASTROSTOMY
  Filled 2018-01-09: qty 50

## 2018-01-09 NOTE — Discharge Instructions (Signed)
Take pepcid 20 mg daily.   See GI for follow up for reflux.   Take bactrim twice daily for 5 days to prevent infection.   See your doctor for follow up   Return to ER if your G tube doesn't function, severe abdominal pain, vomiting, fever

## 2018-01-09 NOTE — ED Notes (Signed)
Pt to xray

## 2018-01-09 NOTE — ED Notes (Signed)
Results reviewed.  No changes in acuity at this time 

## 2018-01-09 NOTE — ED Provider Notes (Signed)
[place] EMERGENCY DEPARTMENT Provider Note   CSN: [id number] Arrival date & time: 01/09/18  1358     History   Chief Complaint Chief Complaint  Patient presents with  . Feeding tube problem    HPI Collin Young is a 67 y.o. male history of hypertension, hyperlipidemia, esophageal cancer with PEG tube here presenting with drainage around the PEG.  Patient has a PEG tube that was placed about a year ago by interventional radiology.  Patient put 2 ensures in her G-tube this morning and several hours later, he noticed some drainage around the G-tube.  He states that he has some pain around the tube as well.  Denies any fevers or chills.   The history is provided by the patient.    Past Medical History:  Diagnosis Date  . Cancer [facility])    Esophagus- radiation 2011  . GERD (gastroesophageal reflux disease)    takes zantac prn  . Headache   . History of radiation therapy 10/19/2013-12/05/2013   pyriform sinus 69.96 Gy/51fx  . HOH (hard of hearing)   . Hyperlipemia   . Hypertension   . Seizures ([place])    alcohol withdrawls- 2001    Patient Active Problem List   Diagnosis Date Noted  . SOB (shortness of breath)   . Angina decubitus ([place])   . Coronary artery disease of bypass graft of native heart with stable angina pectoris ([place])   . Aspiration pneumonia of both lower lobes due to gastric secretions ([place]) 01/11/2017  . Staphylococcus aureus bacteremia 01/11/2017  . Encounter for feeding tube placement   . Dysphagia   . Palliative care by specialist   . Acute respiratory failure with hypoxia ([place])   . Acute congestive heart failure ([place])   . Ischemic cardiomyopathy   . Acute pulmonary edema (HCC)   . Congestive dilated cardiomyopathy ([place])   . Acute combined systolic and diastolic heart failure ([place]) 01/02/2017  . AKI (acute kidney injury) ([place]) 01/02/2017  . Malignant neoplasm of pyriform sinus ([place]) 09/28/2013  . 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  NONSPECIFIC ABN FINDING RAD & OTH EXAM GI TRACT 05/14/2009    Past Surgical History:  Procedure Laterality Date  . CARDIOVASCULAR STRESS TEST  10/09/09   normal nuclearr stress test, EF 57% [name])  . IR GASTROSTOMY TUBE MOD SED  01/13/2017  . IR PATIENT EVAL TECH 0-60 MINS  03/25/2017  . IR REMOVAL TUN ACCESS W/ PORT W/O FL MOD SED  01/15/2017  . LARYNGOSCOPY N/A 09/15/2013   Procedure: LARYNGOSCOPY;  Surgeon: [name], MD;  Location: [place];  Service: ENT;  Laterality: N/A;  direct laryngoscopy with biopsy and esophagoscopy  . NO PAST SURGERIES    . RIGHT/LEFT HEART CATH AND CORONARY ANGIOGRAPHY N/A 01/05/2017   Procedure: Right/Left Heart Cath and Coronary Angiography;  Surgeon: [name], MD;  Location: [place] CV LAB;  Service: Cardiovascular;  Laterality: N/A;        Home Medications    Prior to Admission medications   Medication Sig Start Date End Date Taking? Authorizing Provider  aspirin 81 MG chewable tablet Place 1 tablet (81 mg total) into feeding tube daily. 01/18/17   Patrecia Pour, MD  atorvastatin (LIPITOR) 40 MG tablet Take 1 tablet (40 mg total) by mouth daily. 05/24/17   Skeet Latch, MD  carvedilol (COREG) 6.25 MG tablet Place 1 tablet (6.25 mg total) into feeding tube 2 (two) times daily with a meal. 01/17/17  Patrecia Pour, MD  cephALEXin (KEFLEX) 250 MG capsule take 1 capsule by mouth four times a day 07/29/17   [provider]  fentaNYL (DURAGESIC - DOSED MCG/HR) 12 MCG/HR place 1 patch every 3 days ONTO THE SKIN 11/18/17   [place], Holli Humbles, NP  furosemide (LASIX) 40 MG tablet  05/07/17   [provider]  isosorbide mononitrate (ISMO,MONOKET) 10 MG tablet take 1 tab (10mg ) per tube BID 01/17/17   Patrecia Pour, MD  Nutritional Supplements (FEEDING SUPPLEMENT, JEVITY 1.2 CAL,) LIQD Place 1,000 mLs into feeding tube continuous. 65cc/hr 01/17/17   Patrecia Pour, MD  sacubitril-valsartan (ENTRESTO) 24-26 MG Take 1 tablet by mouth  2 (two) times daily. 02/12/17   Skeet Latch, MD    Family History Family History  Problem Relation Age of Onset  . Breast cancer Mother     Social History Social History   Tobacco Use  . Smoking status: Former Smoker    Packs/day: 1.00    Years: 40.00    Pack years: 40.00    Types: Cigarettes    Start date: 11/08/1960    Last attempt to quit: 09/01/1999    Years since quitting: 18.3  . Smokeless tobacco: Former Systems developer    Types: Chew    Quit date: 09/01/1999  . Tobacco comment: quit in 2011  Substance Use Topics  . Alcohol use: No    Alcohol/week: 0.0 oz    Comment: quit in 2001  . Drug use: No    Comment: back in the day used cocaine,alcohol, marijuana     Allergies   Patient has no known allergies.   Review of Systems Review of Systems  Gastrointestinal: Positive for abdominal pain.  All other systems reviewed and are negative.    Physical Exam Updated Vital Signs BP (!) 151/81   Pulse 76   Temp 98.2 F (36.8 C) (Oral)   Resp 16   SpO2 100%   Physical Exam  Constitutional: He is oriented to person, place, and time.  Chronically ill   HENT:  Head: Normocephalic.  Mouth/Throat: Oropharynx is clear and moist.  Eyes: Pupils are equal, round, and reactive to light. Conjunctivae are normal.  Neck: Normal range of motion. Neck supple.  Cardiovascular: Normal rate, regular rhythm and normal heart sounds.  Pulmonary/Chest: Effort normal and breath sounds normal. No stridor. No respiratory distress.  Abdominal: Soft.  G tube with some granuloma around it, some mild purulent drainage inferior to the tube site. Abdomen nontender   Musculoskeletal: Normal range of motion. He exhibits no edema.  Neurological: He is alert and oriented to person, place, and time. No cranial nerve deficit. Coordination normal.  Skin: Skin is warm.  Psychiatric: He has a normal mood and affect.  Nursing note and vitals reviewed.    ED Treatments / Results  Labs (all labs  ordered are listed, but only abnormal results are displayed) Labs Reviewed  COMPREHENSIVE METABOLIC PANEL - Abnormal; Notable for the following components:      Result Value   Sodium 134 (*)    Chloride 95 (*)    Glucose, Bld 104 (*)    BUN 26 (*)    GFR calc non Af Amer 59 (*)    All other components within normal limits  CBC WITH DIFFERENTIAL/PLATELET - Abnormal; Notable for the following components:   Monocytes Absolute 1.2 (*)    All other components within normal limits  I-STAT CG4 LACTIC ACID, ED  I-STAT CG4 LACTIC ACID, ED  EKG None  Radiology No results found.  Procedures Procedures (including critical care time)  Medications Ordered in ED Medications - No data to display   Initial Impression / Assessment and Plan / ED Course  I have reviewed the triage vital signs and the nursing notes.  Pertinent labs & imaging results that were available during my care of the patient were reviewed by me and considered in my medical decision making (see chart for details).     Collin Young is a 67 y.o. male here with pain around G tube site. Will get xray to confirm G tube placement. G tube appears to flush well.   4:58 PM Xray confirmed G tube placement. There is reflux which is likely causing the fluid back up from the tube. There is some granulation tissue and some drainage from it. I consider inflammation vs early infection. Will try course of bactrim, start zantac.   Final Clinical Impressions(s) / ED Diagnoses   Final diagnoses:  Gastrostomy tube in place [facility])    ED Discharge Orders    None       Drenda Freeze, MD 01/09/18 1659

## 2018-01-09 NOTE — ED Triage Notes (Signed)
Patient reports that after drinking  (he was told to pour it into his feeding tube but he drank it instead)  an Ensure in the morning he noticed it leaked out around his feeding tube several hours later. Skin around feeding tube is warm with pain on palpation. Patient alert and oriented and in no apparent distress at this time.

## 2018-01-13 ENCOUNTER — Other Ambulatory Visit (HOSPITAL_COMMUNITY): Payer: Self-pay | Admitting: Gastroenterology

## 2018-01-13 ENCOUNTER — Encounter (HOSPITAL_COMMUNITY): Payer: Self-pay | Admitting: Radiology

## 2018-01-13 ENCOUNTER — Ambulatory Visit (HOSPITAL_COMMUNITY)
Admission: RE | Admit: 2018-01-13 | Discharge: 2018-01-13 | Disposition: A | Discharge status: Home / Self Care | Payer: Medicare Other | Source: Ambulatory Visit | Attending: Gastroenterology | Admitting: Gastroenterology

## 2018-01-13 DIAGNOSIS — T85598A Other mechanical complication of other gastrointestinal prosthetic devices, implants and grafts, initial encounter: Secondary | ICD-10-CM

## 2018-01-13 DIAGNOSIS — K9423 Gastrostomy malfunction: Secondary | ICD-10-CM | POA: Diagnosis not present

## 2018-01-13 HISTORY — PX: IR REPLACE G-TUBE SIMPLE WO FLUORO: IMG2323

## 2018-01-13 MED ORDER — LIDOCAINE VISCOUS HCL 2 % MT SOLN
OROMUCOSAL | Status: AC
  Filled 2018-01-13: qty 15

## 2018-01-13 NOTE — Procedures (Signed)
Patient's existing 2 [place] pull-through gastrostomy tube (with internal bumper in SQ tissues) was exchanged out for a new 37 French balloon retention gastrostomy tube without immediate complications.

## 2018-01-21 ENCOUNTER — Other Ambulatory Visit: Payer: Self-pay

## 2018-01-21 ENCOUNTER — Inpatient Hospital Stay: Payer: Medicare Other | Attending: Hematology & Oncology | Admitting: Hematology & Oncology

## 2018-01-21 ENCOUNTER — Encounter: Payer: Self-pay | Admitting: Hematology & Oncology

## 2018-01-21 VITALS — BP 103/58 | HR 85 | Temp 98.0°F | Resp 18 | Wt 128.0 lb

## 2018-01-21 DIAGNOSIS — C12 Malignant neoplasm of pyriform sinus: Secondary | ICD-10-CM | POA: Diagnosis not present

## 2018-01-21 DIAGNOSIS — Z79899 Other long term (current) drug therapy: Secondary | ICD-10-CM | POA: Diagnosis not present

## 2018-01-21 DIAGNOSIS — Z8501 Personal history of malignant neoplasm of esophagus: Secondary | ICD-10-CM | POA: Insufficient documentation

## 2018-01-21 MED ORDER — POLYETHYLENE GLYCOL 3350 17 G PO PACK: 17.0000 g | PACK | Freq: Two times a day (BID) | ORAL | 0 refills | Status: DC

## 2018-01-21 NOTE — Progress Notes (Signed)
Hematology and Oncology Follow Up Visit  Collin Young [id number] [birth year] 67 y.o. 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  Young is back for followup.  He comes in with his daughter.  He has not used his feeding tube for over a month.  He is swallowing.  He is eating okay.  He does had to have his meat chopped up.  I think that we should try to get his feeding tube out now.  Apparently, there is a problem recently and the feeding tube had to be repositioned.  He has had no nausea or vomiting.  He has had no diarrhea.  He had she was constipated.  He had to go the emergency room for this.  I went ahead and gave him a prescription for MiraLAX to take twice a day.  He has had no bleeding.  He has had no fever.  He has had no leg swelling.  Currently, his performance status is ECOG 2.  Medications:  Current Outpatient Medications:  .  aspirin 81 MG chewable tablet, Place 1 tablet (81 mg total) into feeding tube daily., Disp: 30 tablet, Rfl: 0 .  atorvastatin (LIPITOR) 40 MG tablet, Take 1 tablet (40 mg total) by mouth daily., Disp: 30 tablet, Rfl: 5 .  carvedilol (COREG) 6.25 MG tablet, Place 1 tablet (6.25 mg total) into feeding tube 2 (two) times daily with a meal., Disp: 60 tablet, Rfl: 0 .  cephALEXin (KEFLEX) 250 MG capsule, take 1 capsule by mouth four times a day, Disp: , Rfl: 0 .  famotidine (PEPCID) 20 MG tablet, Take 1 tablet (20 mg total) by mouth daily., Disp: 30 tablet, Rfl: 0 .  fentaNYL (DURAGESIC - DOSED MCG/HR) 12 MCG/HR, place 1 patch every 3 days ONTO THE SKIN, Disp: 10 patch, Rfl: 0 .  furosemide (LASIX) 40 MG tablet, , Disp: , Rfl: 0 .  isosorbide mononitrate (ISMO,MONOKET) 10 MG tablet, take 1 tab (10mg ) per tube BID, Disp: 60 tablet, Rfl: 0 .   Nutritional Supplements (FEEDING SUPPLEMENT, JEVITY 1.2 CAL,) LIQD, Place 1,000 mLs into feeding tube continuous. 65cc/hr, Disp: 1500 mL, Rfl: 30 .  polyethylene glycol (MIRALAX) packet, Take 17 g by mouth 2 (two) times daily., Disp: 100 each, Rfl: 0 .  sacubitril-valsartan (ENTRESTO) 24-26 MG, Take 1 tablet by mouth 2 (two) times daily., Disp: 60 tablet, Rfl: 10 No current facility-administered medications for this visit.   Facility-Administered Medications Ordered in Other Visits:  .  topical emolient (BIAFINE) emulsion, , Topical, Daily, Kyung Rudd, MD  Allergies: No Known Allergies  Past Medical History, Surgical history, Social history, and Family History were reviewed and updated.  Review of Systems: Review of Systems  Constitutional: Negative.   HENT: Negative.   Eyes: Negative.   Respiratory: Negative.   Cardiovascular: Negative.   Gastrointestinal: Negative.   Genitourinary: Negative.   Musculoskeletal: Negative.   Skin: Negative.   Neurological: Negative.   Endo/Heme/Allergies: Negative.   Psychiatric/Behavioral: Negative.      Physical Exam:  weight is 128 lb (58.1 kg). His oral temperature is 98 F (36.7 C). His blood pressure is 103/58 (abnormal) and his pulse is 85. His respiration is 18 and oxygen saturation is 100%.   Physical Exam  Constitutional: He is oriented to person, place, and time.  HENT:  Head: Normocephalic and atraumatic.  Mouth/Throat: Oropharynx is clear and moist.  Eyes: Pupils are equal, round, and reactive to light. EOM are normal.  Neck: Normal range of motion.  Cardiovascular: Normal rate, regular rhythm and normal heart sounds.  Pulmonary/Chest: Effort normal and breath sounds normal.  Abdominal: Soft. Bowel sounds are normal.  He has a feeding tube in place.  There is no erythema or exudate from the feeding tube entry site.  Musculoskeletal: Normal range of motion. He exhibits no edema, tenderness or deformity.  Lymphadenopathy:     He has no cervical adenopathy.  Neurological: He is alert and oriented to person, place, and time.  Skin: Skin is warm and dry. No rash noted. No erythema.  Psychiatric: He has a normal mood and affect. His behavior is normal. Judgment and thought content normal.  Vitals reviewed.   Lab Results  Component Value Date   WBC 7.6 01/09/2018   HGB 13.2 01/09/2018   HCT 41.0 01/09/2018   MCV 87.6 01/09/2018   PLT 251 01/09/2018     Chemistry      Component Value Date/Time   NA 134 (L) 01/09/2018 1425   NA 139 07/30/2017 0926   NA 137 03/10/2017 1000   K 5.0 01/09/2018 1425   K 3.9 07/30/2017 0926   K 4.3 03/10/2017 1000   CL 95 (L) 01/09/2018 1425   CL 96 (L) 07/30/2017 0926   CO2 30 01/09/2018 1425   CO2 33 07/30/2017 0926   CO2 29 03/10/2017 1000   BUN 26 (H) 01/09/2018 1425   BUN 17 07/30/2017 0926   BUN 14.7 03/10/2017 1000   CREATININE 1.23 01/09/2018 1425   CREATININE 1.16 10/29/2017 1032   CREATININE 1.4 (H) 07/30/2017 0926   CREATININE 1.1 03/10/2017 1000      Component Value Date/Time   CALCIUM 9.3 01/09/2018 1425   CALCIUM 9.6 07/30/2017 0926   CALCIUM 10.0 03/10/2017 1000   ALKPHOS 38 01/09/2018 1425   ALKPHOS 49 07/30/2017 0926   ALKPHOS 51 03/10/2017 1000   AST 24 01/09/2018 1425   AST 35 (H) 10/29/2017 1032   AST 23 03/10/2017 1000   ALT 20 01/09/2018 1425   ALT 34 10/29/2017 1032   ALT 31 07/30/2017 0926   ALT 18 03/10/2017 1000   BILITOT 0.4 01/09/2018 1425   BILITOT 0.3 10/29/2017 1032   BILITOT 0.41 03/10/2017 1000         Impression and Plan: Collin Young is 67 year old gentleman. He had a localized recurrence of carcinoma of the left pyriform sinus. He underwent concurrent chemotherapy and radiation therapy. He finished his treatment back in April of 2015.   Has a history of squamous cell carcinoma of the esophagus. He was treated with radiation chemotherapy. This was completed back in 2010.  Again, we will see about getting the feeding  tube taken out.  I really think that it would be worthwhile to try.  I would want to see him back in another 2 months.  Hopefully, he will be doing okay.  He understands completely that the feeding tube may have to be put back in if there are issues with him swallowing.  [name], MD 5/24/201912:16 PM

## 2018-02-03 ENCOUNTER — Other Ambulatory Visit: Payer: Self-pay | Admitting: Hematology & Oncology

## 2018-02-03 ENCOUNTER — Other Ambulatory Visit: Payer: Self-pay | Admitting: *Deleted

## 2018-02-03 ENCOUNTER — Encounter (HOSPITAL_COMMUNITY): Payer: Self-pay | Admitting: Interventional Radiology

## 2018-02-03 ENCOUNTER — Ambulatory Visit (HOSPITAL_COMMUNITY)
Admission: RE | Admit: 2018-02-03 | Discharge: 2018-02-03 | Disposition: A | Discharge status: Home / Self Care | Payer: Medicare Other | Source: Ambulatory Visit | Attending: Hematology & Oncology | Admitting: Hematology & Oncology

## 2018-02-03 DIAGNOSIS — D508 Other iron deficiency anemias: Secondary | ICD-10-CM

## 2018-02-03 DIAGNOSIS — Z431 Encounter for attention to gastrostomy: Secondary | ICD-10-CM | POA: Diagnosis not present

## 2018-02-03 DIAGNOSIS — R1319 Other dysphagia: Secondary | ICD-10-CM | POA: Diagnosis not present

## 2018-02-03 DIAGNOSIS — C12 Malignant neoplasm of pyriform sinus: Secondary | ICD-10-CM

## 2018-02-03 DIAGNOSIS — I255 Ischemic cardiomyopathy: Secondary | ICD-10-CM

## 2018-02-03 DIAGNOSIS — E039 Hypothyroidism, unspecified: Secondary | ICD-10-CM

## 2018-02-03 DIAGNOSIS — Z8501 Personal history of malignant neoplasm of esophagus: Secondary | ICD-10-CM | POA: Diagnosis not present

## 2018-02-03 HISTORY — PX: IR GASTROSTOMY TUBE REMOVAL: IMG5492

## 2018-02-03 MED ORDER — FENTANYL 12 MCG/HR TD PT72: MEDICATED_PATCH | TRANSDERMAL | 0 refills | Status: DC

## 2018-02-03 MED ORDER — LIDOCAINE VISCOUS HCL 2 % MT SOLN
OROMUCOSAL | Status: AC
  Filled 2018-02-03: qty 15

## 2018-02-03 MED ORDER — LIDOCAINE VISCOUS HCL 2 % MT SOLN
OROMUCOSAL | Status: DC | PRN
  Administered 2018-02-03: 15 mL via OROMUCOSAL

## 2018-02-03 NOTE — Procedures (Signed)
Successful bedside removal of intact pull through G-tube. No immediate post procedural complications.  Jay Malaiyah Achorn, MD Pager #: [phone]  

## 2018-02-18 ENCOUNTER — Encounter: Payer: Self-pay | Admitting: Cardiovascular Disease

## 2018-02-18 ENCOUNTER — Ambulatory Visit (INDEPENDENT_AMBULATORY_CARE_PROVIDER_SITE_OTHER): Payer: Medicare Other | Admitting: Cardiovascular Disease

## 2018-02-18 VITALS — BP 104/57 | HR 92 | Ht 65.0 in | Wt 128.8 lb

## 2018-02-18 DIAGNOSIS — I2510 Atherosclerotic heart disease of native coronary artery without angina pectoris: Secondary | ICD-10-CM

## 2018-02-18 DIAGNOSIS — I5042 Chronic combined systolic (congestive) and diastolic (congestive) heart failure: Secondary | ICD-10-CM

## 2018-02-18 DIAGNOSIS — Z5181 Encounter for therapeutic drug level monitoring: Secondary | ICD-10-CM

## 2018-02-18 DIAGNOSIS — E7800 Pure hypercholesterolemia, unspecified: Secondary | ICD-10-CM | POA: Diagnosis not present

## 2018-02-18 NOTE — Progress Notes (Signed)
Cardiology Office Note   Date:  02/18/2018   ID:  Collin Young, DOB July 06, 1951, MRN [id number]  PCP:  [name], MD  Cardiologist:   Skeet Latch, MD   No chief complaint on file.     History of Present Illness: Collin Young is a 67 y.o. 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  Collin Young was admitted 12/2016 with progressive shortness of breath.  BNP was >2000.  Echo revealed LVEF 10-15% with diffuse hypokinesis, moderate MR and moderate AR.  PASP was 49 mmHg.  He underwent left heart catheterization and was found to have 100% proximal LCx with L-->L collaterals, 99% prox-mid RCA and otherwise mild-moderate CAD.  There was consideration of RCA PCI.  However, this would have required hemodynamic support and was deferred given his frailty and comorbidities.  He was treated with milrinone and IV lasix.  Milrinone was weaned prior to discharge.  During that hospitalization he was also treated for MSSA bacteremia.  He was unable to get a TEE due to his prior cancer.  His Port was removed and a PICC line was placed.   He had a  PEG tube placed due to frank aspiration.  At his last appointment Collin Young was referred for an echo 05/2017 that showed a significant improvement in his LVEF to 45-50% and grade 1 diastolic dysfunction.  There was moderate aortic regurgitation.    Since his last appointment Collin Young has been excellent.  He had some complications with his feeding tube and had it removed 01/2018.  Since that time he is continued to maintain his weight and is eating well.  He continues to work and has no exertional symptoms.  He denies any chest pain or shortness of breath.  He has no lower extremity edema, orthopnea, or PND.     Past Medical History:  Diagnosis Date  . Cancer [facility])    Esophagus- radiation 2011  . GERD (gastroesophageal reflux disease)    takes zantac prn  . Headache   . History of radiation therapy 10/19/2013-12/05/2013   pyriform sinus 69.96 Gy/48fx  . HOH (hard of hearing)   . Hyperlipemia   . Hypertension   . Seizures ([place])    alcohol withdrawls- 2001    Past Surgical History:  Procedure Laterality Date  . CARDIOVASCULAR STRESS TEST  10/09/09   normal nuclearr stress test, EF 57% [name])  . IR GASTROSTOMY TUBE MOD SED  01/13/2017  . IR GASTROSTOMY TUBE REMOVAL  02/03/2018  . IR PATIENT EVAL TECH 0-60 MINS  03/25/2017  . IR REMOVAL TUN ACCESS W/ PORT W/O FL MOD SED  01/15/2017  . IR REPLACE G-TUBE SIMPLE WO FLUORO  01/13/2018  . LARYNGOSCOPY N/A 09/15/2013   Procedure: LARYNGOSCOPY;  Surgeon: [name], MD;  Location: [place];  Service: ENT;  Laterality: N/A;  direct laryngoscopy with biopsy and esophagoscopy  . NO PAST SURGERIES    . RIGHT/LEFT HEART CATH AND CORONARY ANGIOGRAPHY N/A 01/05/2017   Procedure: Right/Left Heart Cath and Coronary Angiography;  Surgeon: [name], MD;  Location: [place] [facility];  Service: Cardiovascular;  Laterality: N/A;     Current Outpatient Medications  Medication Sig Dispense Refill  . aspirin 81 MG chewable tablet Place 1 tablet (81 mg total) into feeding tube daily. 30 tablet 0  .  atorvastatin (LIPITOR) 40 MG tablet Take 1 tablet (40 mg total) by mouth daily. 30 tablet 5  . carvedilol (COREG) 6.25 MG tablet Place 1 tablet (6.25 mg total) into feeding tube 2 (two) times daily with a meal. 60 tablet 0  . isosorbide mononitrate (ISMO,MONOKET) 10 MG tablet take 1 tab (10mg ) per tube BID 60 tablet 0  . sacubitril-valsartan (ENTRESTO) 24-26 MG Take 1 tablet by mouth 2 (two) times daily. 60 tablet 10   No current facility-administered medications for this visit.    Facility-Administered Medications Ordered in Other Visits  Medication Dose Route Frequency Provider Last Rate Last Dose  . topical  emolient (BIAFINE) emulsion   Topical Daily Kyung Rudd, MD        Allergies:   Patient has no known allergies.    Social History:  The patient  reports that he quit smoking about 18 years ago. His smoking use included cigarettes. He started smoking about 57 years ago. He has a 40.00 pack-year smoking history. He quit smokeless tobacco use about 18 years ago. His smokeless tobacco use included chew. He reports that he does not drink alcohol or use drugs.   Family History:  The patient's family history includes Breast cancer in his mother.    ROS:  Please see the history of present illness.   Otherwise, review of systems are positive for none.   All other systems are reviewed and negative.    PHYSICAL EXAM: VS:  BP (!) 104/57   Pulse 92   Ht 5\' 5"  (1.651 m)   Wt 128 lb 12.8 oz (58.4 kg)   SpO2 97%   BMI 21.43 kg/m  , BMI Body mass index is 21.43 kg/m. GENERAL:  Well appearing HEENT: Pupils equal round and reactive, fundi not visualized, oral mucosa unremarkable NECK:  No jugular venous distention, waveform within normal limits, carotid upstroke brisk and symmetric, no bruits LUNGS:  Clear to auscultation bilaterally HEART:  RRR.  PMI not displaced or sustained,S1 and S2 within normal limits, no S3, no S4, no clicks, no rubs, no murmurs ABD:  Flat, positive bowel sounds normal in frequency in pitch, no bruits, no rebound, no guarding, no midline pulsatile mass, no hepatomegaly, no splenomegaly EXT:  2 plus pulses throughout, no edema, no cyanosis no clubbing SKIN:  No rashes no nodules NEURO:  Cranial nerves II through XII grossly intact, motor grossly intact throughout PSYCH:  Cognitively intact, oriented to person place and time   EKG:  EKG is not ordered today. Sinus rhythm.  Rate 83 bpm.  Lateral TWI.   Echo 05/06/17: Study Conclusions  - Left ventricle: Diffuse hypokinesis slightly worse in inferior   wall. The cavity size was mildly dilated. Systolic function was    mildly reduced. The estimated ejection fraction was in the range   of 45% to 50%. Doppler parameters are consistent with abnormal   left ventricular relaxation (grade 1 diastolic dysfunction). - Aortic valve: Severely calcified non coronary cusp. There was   moderate regurgitation. - Mitral valve: There was mild regurgitation. - Left atrium: The atrium was mildly dilated. - Atrial septum: No defect or patent foramen ovale was identified.  Echo 01/03/17: Study Conclusions  - Left ventricle: The cavity size was normal. Wall thickness was   normal. The estimated ejection fraction was in the range of 10%   to 15%. Diffuse hypokinesis. DIastolic function is abnormal,   indeterminant grade. - Aortic valve: There was moderate regurgitation. Valve area (VTI):   1.08 cm^2.  Valve area (Vmax): 1.5 cm^2. Valve area (Vmean): 1.5   cm^2. - Mitral valve: There was moderate regurgitation. - Left atrium: The atrium was mildly dilated. - Right ventricle: Systolic function was mildly to moderately   reduced. - Tricuspid valve: There was moderate regurgitation. - Pulmonary arteries: Systolic pressure was moderately increased.   PA peak pressure: 49 mm Hg (S).  LHC 01/05/17:  Prox RCA to Mid RCA lesion, 99 %stenosed.  Mid RCA lesion, 30 %stenosed.  Dist RCA lesion, 40 %stenosed.  RPDA lesion, 20 %stenosed.  Prox Cx lesion, 100 %stenosed.  Ost Ramus to Ramus lesion, 50 %stenosed.  Ost 3rd Diag to 3rd Diag lesion, 30 %stenosed.  Mid LAD lesion, 40 %stenosed.  Ost LAD to Prox LAD lesion, 20 %stenosed.  Ost LM to LM lesion, 40 %stenosed.  Hemodynamic findings consistent with mild pulmonary hypertension.   1. Severe calcific disease in the proximal to mid RCA 2. 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 Moderate left main and mid LAD stenosis 4. Elevated filling pressures   Recent Labs: 10/29/2017: TSH 1.444 01/09/2018: ALT 20; BUN 26; Creatinine, Ser 1.23; Hemoglobin 13.2;  Platelets 251; Potassium 5.0; Sodium 134    Lipid Panel    Component Value Date/Time   CHOL 167 04/30/2017 0828   TRIG 69 04/30/2017 0828   HDL 61 04/30/2017 0828   CHOLHDL 2.7 04/30/2017 0828   LDLCALC 92 04/30/2017 0828      Wt Readings from Last 3 Encounters:  02/18/18 128 lb 12.8 oz (58.4 kg)  01/21/18 128 lb (58.1 kg)  10/29/17 134 lb 4 oz (60.9 kg)      ASSESSMENT AND PLAN:  # Chronic systolic and diastolic heart failure:  # Moderate MR: # Moderate AR:  LVEF significantly improved from 10-15% to 45-50%.  He is doing extremely well and remains asymptomatic. Continue Entresto, carvedilol, and Imdur.  Likely ischemic cardiomyopathy.    # Obstructive CAD:  # Hyperlipidemia:  Medically managed due to acute illness at the time of diagnosis.  100% LCx and 99% RCA.  The RCA would require hemodynamic support.  He has no chest pain so we will continue medical management for now.  He continues to be very active.  Continue aspirin, carvedilol, Imdur and atorvastatin. Repeat lipids.    Current medicines are reviewed at length with the patient today.  The patient does not have concerns regarding medicines.  The following changes have been made:  none  Labs/ tests ordered today include:  Orders Placed This Encounter  Procedures  . Lipid panel     Disposition:   FU with Oaklynn Stierwalt C. Oval Linsey, MD, [facility] in 6 months.    Signed, Elhadj Girton C. Oval Linsey, MD, [facility]  02/18/2018 10:01 AM     [facility]

## 2018-02-18 NOTE — Patient Instructions (Signed)
Medication Instructions:  Your physician recommends that you continue on your current medications as directed. Please refer to the Current Medication list given to you today.  Labwork: FASTING LP SOON   Testing/Procedures: NONE  Follow-Up: Your physician recommends that you schedule a follow-up appointment in: 6 MONTHS   If you need a refill on your cardiac medications before your next appointment, please call your pharmacy.

## 2018-02-21 LAB — LIPID PANEL
CHOL/HDL RATIO: 2.2 ratio (ref 0.0–5.0)
Cholesterol, Total: 159 mg/dL (ref 100–199)
HDL: 71 mg/dL (ref >39)
LDL Calculated: 80 mg/dL (ref 0–99)
Triglycerides: 40 mg/dL (ref 0–149)
VLDL CHOLESTEROL CAL: 8 mg/dL (ref 5–40)

## 2018-03-01 ENCOUNTER — Other Ambulatory Visit: Payer: Self-pay | Admitting: Cardiovascular Disease

## 2018-03-02 ENCOUNTER — Telehealth: Payer: Self-pay | Admitting: Cardiovascular Disease

## 2018-03-02 DIAGNOSIS — E7800 Pure hypercholesterolemia, unspecified: Secondary | ICD-10-CM

## 2018-03-02 NOTE — Telephone Encounter (Signed)
-----   Message from Skeet Latch, MD sent at 02/27/2018  7:02 PM EDT ----- Cholesterol levels are very good but LDL should be less than 70.  Increase atorvastatin to 80 mg.  Repeat lipids an dCMP in 6-8 weeks.

## 2018-03-02 NOTE — Telephone Encounter (Signed)
Left message to call back  

## 2018-03-02 NOTE — Telephone Encounter (Signed)
New message ° ° °Please call with lab results °

## 2018-03-04 MED ORDER — ATORVASTATIN CALCIUM 40 MG PO TABS: 40.0000 mg | ORAL_TABLET | Freq: Every day | ORAL | 5 refills | Status: DC

## 2018-03-04 NOTE — Telephone Encounter (Signed)
Spoke with pt, he reports he has not been taking atorvastatin for some time. It was stopped by his medical doctor because his numbers were lower. Advised the patient to restart lipitor 40 mg once daily. New script sent to the pharmacy and Lab orders mailed to the pt.

## 2018-03-10 ENCOUNTER — Other Ambulatory Visit: Payer: Self-pay

## 2018-03-10 DIAGNOSIS — E7800 Pure hypercholesterolemia, unspecified: Secondary | ICD-10-CM

## 2018-03-10 MED ORDER — ATORVASTATIN CALCIUM 80 MG PO TABS: 80.0000 mg | ORAL_TABLET | Freq: Every day | ORAL | 5 refills | Status: DC

## 2018-03-11 ENCOUNTER — Inpatient Hospital Stay: Payer: Medicare Other

## 2018-03-11 ENCOUNTER — Other Ambulatory Visit: Payer: Self-pay

## 2018-03-11 ENCOUNTER — Encounter: Payer: Self-pay | Admitting: Hematology & Oncology

## 2018-03-11 ENCOUNTER — Inpatient Hospital Stay: Payer: Medicare Other | Attending: Hematology & Oncology | Admitting: Hematology & Oncology

## 2018-03-11 VITALS — BP 103/57 | HR 77 | Temp 97.7°F | Resp 16 | Wt 128.0 lb

## 2018-03-11 DIAGNOSIS — C12 Malignant neoplasm of pyriform sinus: Secondary | ICD-10-CM

## 2018-03-11 DIAGNOSIS — Z8501 Personal history of malignant neoplasm of esophagus: Secondary | ICD-10-CM | POA: Diagnosis not present

## 2018-03-11 LAB — CMP (CANCER CENTER ONLY)
ALBUMIN: 3.7 g/dL (ref 3.5–5.0)
ALK PHOS: 39 U/L (ref 26–84)
ALT: 36 U/L (ref 10–47)
ANION GAP: 5 (ref 5–15)
AST: 45 U/L — AB (ref 11–38)
BUN: 26 mg/dL — ABNORMAL HIGH (ref 7–22)
CALCIUM: 9 mg/dL (ref 8.0–10.3)
CO2: 34 mmol/L — ABNORMAL HIGH (ref 18–33)
CREATININE: 1.1 mg/dL (ref 0.60–1.20)
Chloride: 100 mmol/L (ref 98–108)
Glucose, Bld: 122 mg/dL — ABNORMAL HIGH (ref 73–118)
Potassium: 4.7 mmol/L (ref 3.3–4.7)
Sodium: 139 mmol/L (ref 128–145)
TOTAL PROTEIN: 7.2 g/dL (ref 6.4–8.1)
Total Bilirubin: 0.6 mg/dL (ref 0.2–1.6)

## 2018-03-11 LAB — CBC WITH DIFFERENTIAL (CANCER CENTER ONLY)
BASOS ABS: 0 10*3/uL (ref 0.0–0.1)
BASOS PCT: 0 %
Eosinophils Absolute: 0.1 10*3/uL (ref 0.0–0.5)
Eosinophils Relative: 3 %
HEMATOCRIT: 41.8 % (ref 38.7–49.9)
HEMOGLOBIN: 13.5 g/dL (ref 13.0–17.1)
LYMPHS PCT: 27 %
Lymphs Abs: 0.9 10*3/uL (ref 0.9–3.3)
MCH: 28.9 pg (ref 28.0–33.4)
MCHC: 32.3 g/dL (ref 32.0–35.9)
MCV: 89.5 fL (ref 82.0–98.0)
Monocytes Absolute: 0.7 10*3/uL (ref 0.1–0.9)
Monocytes Relative: 19 %
NEUTROS ABS: 1.8 10*3/uL (ref 1.5–6.5)
NEUTROS PCT: 51 %
Platelet Count: 186 10*3/uL (ref 145–400)
RBC: 4.67 MIL/uL (ref 4.20–5.70)
RDW: 13.9 % (ref 11.1–15.7)
WBC Count: 3.5 10*3/uL — ABNORMAL LOW (ref 4.0–10.0)

## 2018-03-11 NOTE — Progress Notes (Signed)
Hematology and Oncology Follow Up Visit  Collin Young [id number] 06/18/1951 67 y.o. 03/11/2018   Principle Diagnosis:  Stage II (T2 N0M0) squamous cell carcinoma of the left piriform sinus Stage IIIB squamous cell carcinoma of the esophagus-remission Congestive heart failure  Current Therapy:    Observation     Interim History:  Mr.  Collin Young is back for followup.  He looks great.  His feeding tube is now out.  There is no issues with him having the feeding tube taken out.  He is eating well.  His weight is holding stable.  He has had no cardiac issues.  He is on multiple cardiac medications.  He has had no bleeding.  He has had no pain.  He is on a Duragesic patch which is helping him quite a bit.  He has had no change in bowel or bladder habits.  Currently, his performance status is ECOG 2.  Medications:  Current Outpatient Medications:  .  aspirin 81 MG chewable tablet, Place 1 tablet (81 mg total) into feeding tube daily., Disp: 30 tablet, Rfl: 0 .  atorvastatin (LIPITOR) 80 MG tablet, Take 1 tablet (80 mg total) by mouth daily., Disp: 30 tablet, Rfl: 5 .  carvedilol (COREG) 6.25 MG tablet, Place 1 tablet (6.25 mg total) into feeding tube 2 (two) times daily with a meal., Disp: 60 tablet, Rfl: 0 .  isosorbide mononitrate (ISMO,MONOKET) 10 MG tablet, take 1 tab (10mg ) per tube BID, Disp: 60 tablet, Rfl: 0 .  sacubitril-valsartan (ENTRESTO) 24-26 MG, Take 1 tablet by mouth 2 (two) times daily., Disp: 180 tablet, Rfl: 3 No current facility-administered medications for this visit.   Facility-Administered Medications Ordered in Other Visits:  .  topical emolient (BIAFINE) emulsion, , Topical, Daily, Kyung Rudd, MD  Allergies: No Known Allergies  Past Medical History, Surgical history, Social history, and Family History were reviewed and updated.  Review of Systems: Review of Systems  Constitutional: Negative.   HENT: Negative.   Eyes: Negative.   Respiratory:  Negative.   Cardiovascular: Negative.   Gastrointestinal: Negative.   Genitourinary: Negative.   Musculoskeletal: Negative.   Skin: Negative.   Neurological: Negative.   Endo/Heme/Allergies: Negative.   Psychiatric/Behavioral: Negative.      Physical Exam:  weight is 128 lb (58.1 kg). His oral temperature is 97.7 F (36.5 C). His blood pressure is 103/57 (abnormal) and his pulse is 77. His respiration is 16 and oxygen saturation is 98%.   Physical Exam  Constitutional: He is oriented to person, place, and time.  HENT:  Head: Normocephalic and atraumatic.  Mouth/Throat: Oropharynx is clear and moist.  Eyes: Pupils are equal, round, and reactive to light. EOM are normal.  Neck: Normal range of motion.  Cardiovascular: Normal rate, regular rhythm and normal heart sounds.  Pulmonary/Chest: Effort normal and breath sounds normal.  Abdominal: Soft. Bowel sounds are normal.  He has a feeding tube in place.  There is no erythema or exudate from the feeding tube entry site.  Musculoskeletal: Normal range of motion. He exhibits no edema, tenderness or deformity.  Lymphadenopathy:    He has no cervical adenopathy.  Neurological: He is alert and oriented to person, place, and time.  Skin: Skin is warm and dry. No rash noted. No erythema.  Psychiatric: He has a normal mood and affect. His behavior is normal. Judgment and thought content normal.  Vitals reviewed.   Lab Results  Component Value Date   WBC 3.5 (L) 03/11/2018   HGB 13.5  03/11/2018   HCT 41.8 03/11/2018   MCV 89.5 03/11/2018   PLT 186 03/11/2018     Chemistry      Component Value Date/Time   NA 139 03/11/2018 1019   NA 139 07/30/2017 0926   NA 137 03/10/2017 1000   K 4.7 03/11/2018 1019   K 3.9 07/30/2017 0926   K 4.3 03/10/2017 1000   CL 100 03/11/2018 1019   CL 96 (L) 07/30/2017 0926   CO2 34 (H) 03/11/2018 1019   CO2 33 07/30/2017 0926   CO2 29 03/10/2017 1000   BUN 26 (H) 03/11/2018 1019   BUN 17  07/30/2017 0926   BUN 14.7 03/10/2017 1000   CREATININE 1.10 03/11/2018 1019   CREATININE 1.4 (H) 07/30/2017 0926   CREATININE 1.1 03/10/2017 1000      Component Value Date/Time   CALCIUM 9.0 03/11/2018 1019   CALCIUM 9.6 07/30/2017 0926   CALCIUM 10.0 03/10/2017 1000   ALKPHOS 39 03/11/2018 1019   ALKPHOS 49 07/30/2017 0926   ALKPHOS 51 03/10/2017 1000   AST 45 (H) 03/11/2018 1019   AST 23 03/10/2017 1000   ALT 36 03/11/2018 1019   ALT 31 07/30/2017 0926   ALT 18 03/10/2017 1000   BILITOT 0.6 03/11/2018 1019   BILITOT 0.41 03/10/2017 1000         Impression and Plan: Collin Young is 67 year old gentleman. He had a localized recurrence of carcinoma of the left pyriform sinus. He underwent concurrent chemotherapy and radiation therapy. He finished his treatment back in April of 2015.   Has a history of squamous cell carcinoma of the esophagus. He was treated with radiation chemotherapy. This was completed back in 2010.  I think we get him back in 4 months now.  I think this would be reasonable.  I do not really see his cancer coming back as a problem.  Is now been over 4 years.  [name], MD 7/12/201911:31 AM

## 2018-04-04 ENCOUNTER — Other Ambulatory Visit: Payer: Self-pay

## 2018-04-04 MED ORDER — FENTANYL 12 MCG/HR TD PT72: 12.5000 ug | MEDICATED_PATCH | TRANSDERMAL | 0 refills | Status: DC

## 2018-06-07 ENCOUNTER — Encounter: Payer: Self-pay | Admitting: Radiation Oncology

## 2018-06-14 NOTE — Progress Notes (Signed)
GU Location of Tumor / Histology: prostatic adenocarcinoma  If Prostate Cancer, Gleason Score is (4 + 3) and PSA is (9.3) on 02/01/2018. Prostate volume: 25 cc.   [name] was referred by Dr. [name] to Dr. Karsten Ro for further evaluation of an elevated PSA.   Biopsies of prostate (if applicable) revealed:    Past/Anticipated interventions by urology, if any: biopsy, referral to radiation oncology for seeds vs. IMRT  Past/Anticipated interventions by medical oncology, if any: no  Weight changes, if any: no  Bowel/Bladder complaints, if any: IPSS 6. SHIM 25. Denies dysuria, hematuria, or urinary leakage.   Nausea/Vomiting, if any: no  Pain issues, if any:  no  SAFETY ISSUES:  Prior radiation? yes, for esophageal cancer 5 years ago  Pacemaker/ICD? no  Possible current pregnancy? no  Is the patient on methotrexate? no  Current Complaints / other details:  67 year old male. Legally separate. Two daughters. Resides in [place].

## 2018-06-15 ENCOUNTER — Ambulatory Visit
Admission: RE | Admit: 2018-06-15 | Discharge: 2018-06-15 | Disposition: A | Discharge status: Home / Self Care | Payer: Medicare Other | Source: Ambulatory Visit | Attending: Radiation Oncology | Admitting: Radiation Oncology

## 2018-06-15 ENCOUNTER — Other Ambulatory Visit: Payer: Self-pay

## 2018-06-15 ENCOUNTER — Encounter: Payer: Self-pay | Admitting: Radiation Oncology

## 2018-06-15 VITALS — BP 121/77 | HR 87 | Temp 97.5°F | Resp 20 | Ht 65.0 in | Wt 130.6 lb

## 2018-06-15 DIAGNOSIS — I10 Essential (primary) hypertension: Secondary | ICD-10-CM | POA: Insufficient documentation

## 2018-06-15 DIAGNOSIS — Z8501 Personal history of malignant neoplasm of esophagus: Secondary | ICD-10-CM | POA: Diagnosis not present

## 2018-06-15 DIAGNOSIS — Z923 Personal history of irradiation: Secondary | ICD-10-CM | POA: Insufficient documentation

## 2018-06-15 DIAGNOSIS — C61 Malignant neoplasm of prostate: Secondary | ICD-10-CM | POA: Diagnosis not present

## 2018-06-15 DIAGNOSIS — Z79899 Other long term (current) drug therapy: Secondary | ICD-10-CM | POA: Insufficient documentation

## 2018-06-15 DIAGNOSIS — Z7982 Long term (current) use of aspirin: Secondary | ICD-10-CM | POA: Insufficient documentation

## 2018-06-15 DIAGNOSIS — Z87891 Personal history of nicotine dependence: Secondary | ICD-10-CM | POA: Insufficient documentation

## 2018-06-15 HISTORY — DX: Malignant neoplasm of prostate: C61

## 2018-06-15 NOTE — Progress Notes (Signed)
See progress note under physician encounter. 

## 2018-06-15 NOTE — Progress Notes (Signed)
Radiation Oncology         [phone] ________________________________  Initial outpatient Consultation  Name: Collin Young MRN: [id number]  Date: 06/15/2018  DOB: 1951/04/14  QJ:JHERDEY, Jori Moll, MD  [name], MD   REFERRING PHYSICIAN: [name], MD  DIAGNOSIS: 67 y.o. gentleman with Stage T1c adenocarcinoma of the prostate with Gleason score of 4+3, and PSA of 9.3.    ICD-10-CM   1. Malignant neoplasm of prostate ([place]) C61     HISTORY OF PRESENT ILLNESS: Collin Young is a 67 y.o. male with a diagnosis of prostate cancer. He was noted to have an elevated PSA of 9.3 by his primary care physician, Dr. Mancel Bale.  Accordingly, he was referred for evaluation in urology by Dr. Karsten Ro on 05/10/18,  digital rectal examination was performed at that time revealing symmetrical lobes and no nodule.  The patient proceeded to transrectal ultrasound with 12 biopsies of the prostate on 05/27/18.  The prostate volume measured 25 cc.  Out of 12 core biopsies, 4 were positive.  The maximum Gleason score was 4+3, and this was seen in right base and right base lateral.  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 Kyung Rudd.  His medical oncologist is Dr. Burney Gauze.  He reports that he has not seen his ENT, Dr. Redmond Baseman, in several years.  The patient reviewed the biopsy results with his urologist and he has kindly been referred today for discussion of potential radiation treatment options.   PREVIOUS RADIATION THERAPY: Yes  10/19/13 - 12/05/13: Pyriform Sinus and regional lymph node, 69.96 Gy in 33 fractions/ IMRT([facility])  2010: Stage III Squamous cell carcinoma of the esophagus  PAST MEDICAL HISTORY:  Past Medical History:  Diagnosis Date  . Cancer [facility])    Esophagus- radiation 2011  . GERD  (gastroesophageal reflux disease)    takes zantac prn  . Headache   . History of radiation therapy 10/19/2013-12/05/2013   pyriform sinus 69.96 Gy/68f  . HOH (hard of hearing)   . Hyperlipemia   . Hypertension   . Prostate cancer ([facility]   . Seizures ([facility]    alcohol withdrawls- 2001      PAST SURGICAL HISTORY: Past Surgical History:  Procedure Laterality Date  . CARDIOVASCULAR STRESS TEST  10/09/09   normal nuclearr stress test, EF 57% ([name]  . IR GASTROSTOMY TUBE MOD SED  01/13/2017  . IR GASTROSTOMY TUBE REMOVAL  02/03/2018  . IR PATIENT EVAL TECH 0-60 MINS  03/25/2017  . IR REMOVAL TUN ACCESS W/ PORT W/O FL MOD SED  01/15/2017  . IR REPLACE G-TUBE SIMPLE WO FLUORO  01/13/2018  . LARYNGOSCOPY N/A 09/15/2013   Procedure: LARYNGOSCOPY;  Surgeon: DMelida Quitter MD;  [place]  Service: ENT;  Laterality: N/A;  direct laryngoscopy with biopsy and esophagoscopy  . NO PAST SURGERIES    . RIGHT/LEFT HEART CATH AND CORONARY ANGIOGRAPHY N/A 01/05/2017   Procedure: Right/Left Heart Cath and Coronary Angiography;  Surgeon: MBurnell Blanks MD;  [facility];  Service: Cardiovascular;  Laterality: N/A;    FAMILY HISTORY:  Family History  Problem Relation Age of Onset  . Breast cancer Mother   . Prostate cancer Neg Hx   . Colon cancer Neg Hx   . Pancreatic cancer Neg Hx     SOCIAL  HISTORY:  Social History   Socioeconomic History  . Marital status: Legally Separated    Spouse name: Not on file  . Number of children: 2  . Years of education: Not on file  . Highest education level: Not on file  Occupational History  . Occupation: retired  Scientific laboratory technician  . Financial resource strain: Not on file  . Food insecurity:    Worry: Not on file    Inability: Not on file  . Transportation needs:    Medical: Not on file    Non-medical: Not on file  Tobacco Use  . Smoking status: Former Smoker    Packs/day: 1.00    Years: 40.00    Pack years: 40.00    Types:  Cigarettes    Start date: 11/08/1960    Last attempt to quit: 08/31/2009    Years since quitting: 8.8  . Smokeless tobacco: Former Systems developer    Types: [place] date: 09/01/1999  Substance and Sexual Activity  . Alcohol use: No    Alcohol/week: 0.0 standard drinks    Comment: quit in 2001  . Drug use: No    Comment: back in the day used cocaine,alcohol, marijuana  . Sexual activity: Yes  Lifestyle  . Physical activity:    Days per week: Not on file    Minutes per session: Not on file  . Stress: Not on file  Relationships  . Social connections:    Talks on phone: Not on file    Gets together: Not on file    Attends religious service: Not on file    Active member of club or organization: Not on file    Attends meetings of clubs or organizations: Not on file    Relationship status: Not on file  . Intimate partner violence:    Fear of current or ex partner: Not on file    Emotionally abused: Not on file    Physically abused: Not on file    Forced sexual activity: Not on file  Other Topics Concern  . Not on file  Social History Narrative  . Not on file    ALLERGIES: Patient has no known allergies.  MEDICATIONS:  Current Outpatient Medications  Medication Sig Dispense Refill  . aspirin 81 MG chewable tablet Place 1 tablet (81 mg total) into feeding tube daily. 30 tablet 0  . atorvastatin (LIPITOR) 80 MG tablet Take 1 tablet (80 mg total) by mouth daily. 30 tablet 5  . carvedilol (COREG) 6.25 MG tablet Place 1 tablet (6.25 mg total) into feeding tube 2 (two) times daily with a meal. 60 tablet 0  . fentaNYL (DURAGESIC) 12 MCG/HR Place 1 patch (12.5 mcg total) onto the skin every 3 (three) days. 10 patch 0  . isosorbide mononitrate (ISMO,MONOKET) 10 MG tablet take 1 tab (12m) per tube BID 60 tablet 0  . sacubitril-valsartan (ENTRESTO) 24-26 MG Take 1 tablet by mouth 2 (two) times daily. 180 tablet 3   No current facility-administered medications for this encounter.     Facility-Administered Medications Ordered in Other Encounters  Medication Dose Route Frequency Provider Last Rate Last Dose  . topical emolient (BIAFINE) emulsion   Topical Daily MKyung Rudd MD        REVIEW OF SYSTEMS:  On review of systems, the patient reports that he is doing well overall. He denies any chest pain, shortness of breath, cough, fevers, chills, night sweats, unintended weight changes. He denies any bowel disturbances, and denies abdominal pain,  nausea or vomiting. He denies any new musculoskeletal or joint aches or pains. His IPSS was 6, indicating mild urinary symptoms. His SHIM was 25, indicating he does not have erectile dysfunction. A complete review of systems is obtained and is otherwise negative.    PHYSICAL EXAM:  Wt Readings from Last 3 Encounters:  06/15/18 130 lb 9.6 oz (59.2 kg)  03/11/18 128 lb (58.1 kg)  02/18/18 128 lb 12.8 oz (58.4 kg)   Temp Readings from Last 3 Encounters:  06/15/18 (!) 97.5 F (36.4 C) (Oral)  03/11/18 97.7 F (36.5 C) (Oral)  01/21/18 98 F (36.7 C) (Oral)   BP Readings from Last 3 Encounters:  06/15/18 121/77  03/11/18 (!) 103/57  02/18/18 (!) 104/57   Pulse Readings from Last 3 Encounters:  06/15/18 87  03/11/18 77  02/18/18 92    /10  In general this is a well appearing African American gentleman in no acute distress. He is alert and oriented x4 and appropriate throughout the examination. HEENT reveals that the patient is normocephalic, atraumatic. EOMs are intact. PERRLA. Skin is intact without any evidence of gross lesions. Cardiovascular exam reveals a regular rate and rhythm, no clicks rubs or murmurs are auscultated. Chest is clear to auscultation bilaterally. Lymphatic assessment is performed and does not reveal any adenopathy in the cervical, supraclavicular, axillary, or inguinal chains. Abdomen has active bowel sounds in all quadrants and is intact. The abdomen is soft, non tender, non distended. Lower  extremities are negative for pretibial pitting edema, deep calf tenderness, cyanosis or clubbing. He is accompanied by his daughter today who is getting married on Sunday.   KPS = 90  100 - Normal; no complaints; no evidence of disease. 90   - Able to carry on normal activity; minor signs or symptoms of disease. 80   - Normal activity with effort; some signs or symptoms of disease. 45   - Cares for self; unable to carry on normal activity or to do active work. 60   - Requires occasional assistance, but is able to care for most of his personal needs. 50   - Requires considerable assistance and frequent medical care. 22   - Disabled; requires special care and assistance. 18   - Severely disabled; hospital admission is indicated although death not imminent. 85   - Very sick; hospital admission necessary; active supportive treatment necessary. 10   - Moribund; fatal processes progressing rapidly. 0     - Dead  Karnofsky DA, Abelmann Byram, Craver LS and Burchenal [facility] [phone]) The use of the nitrogen mustards in the palliative treatment of carcinoma: with particular reference to bronchogenic carcinoma Cancer 1 634-56  LABORATORY DATA:  Lab Results  Component Value Date   WBC 3.5 (L) 03/11/2018   HGB 13.5 03/11/2018   HCT 41.8 03/11/2018   MCV 89.5 03/11/2018   PLT 186 03/11/2018   Lab Results  Component Value Date   NA 139 03/11/2018   K 4.7 03/11/2018   CL 100 03/11/2018   CO2 34 (H) 03/11/2018   Lab Results  Component Value Date   ALT 36 03/11/2018   AST 45 (H) 03/11/2018   ALKPHOS 39 03/11/2018   BILITOT 0.6 03/11/2018     RADIOGRAPHY: No results found.    IMPRESSION/PLAN: 1. 67 y.o. gentleman with Stage T1c adenocarcinoma of the prostate with Gleason Score of 4+3, and PSA of 9.3. We discussed the patient's workup and outlined the nature of prostate cancer in this setting. The patient's  T stage, Gleason's score, and PSA put him into the intermediate risk group. Accordingly, he  is eligible for a variety of potential treatment options including brachytherapy, 5.5 - 8 weeks of external radiation or 5 weeks of external radiation followed by a brachytherapy boost. We discussed the available radiation techniques, and focused on the details and logistics and delivery.  We discussed and outlined the risks, benefits, short and long-term effects associated with radiotherapy and compared and contrasted these with prostatectomy. We discussed the role of SpaceOAR in reducing the rectal toxicity associated with radiotherapy.   At the end of the conversation the patient is interested in moving forward with brachytherapy and use of SpaceOAR to reduce rectal toxicity from radiotherapy.  We will share our discussion with Dr. Karsten Ro and move forward with scheduling his CT [facility] planning appointment in the near future.  The patient met briefly with Romie Jumper in our office who will be working closely with him to coordinate OR scheduling and pre and post procedure appointments.  We will request the anesthesiology team review his history to assess any need for preprocedural evaluations regarding his prior head and neck radiation.  We will contact the pharmaceutical rep to ensure that [place] is available at the time of procedure.  He will have a prostate MRI following his post-seed CT SIM to confirm appropriate distribution of the [place].  We spent 30 minutes face to face with the patient and more than 50% of that time was spent in counseling and/or coordination of care.    Nicholos Johns, PA-C    Tyler Pita, MD  [facility] Direct Dial: [phone]  Fax: [phone] [email or web address]  Skype  [email or web address]   This document serves as a record of services personally performed by Tyler Pita, MD and Freeman Caldron, PA-C. It was created on their behalf by Wilburn Mylar, a trained medical scribe. The creation of this record is based on the scribe's personal  observations and the provider's statements to them. This document has been checked and approved by the attending provider.

## 2018-06-16 ENCOUNTER — Telehealth: Payer: Self-pay | Admitting: Medical Oncology

## 2018-06-16 NOTE — Telephone Encounter (Signed)
Spoke with patient to introduce myself as the prostate nurse navigator and my role. I was unable to meet him yesterday when he consulted with Dr. Tammi Klippel. He states the consult went very well and he has decided on brachytherapy as treatment for his prostate cancer. He is aware Enid Derry will be in contact with him regarding appointments for planning and surgery. I asked him to call me with questions or concerns. He voiced understanding.

## 2018-06-17 DIAGNOSIS — C61 Malignant neoplasm of prostate: Secondary | ICD-10-CM | POA: Insufficient documentation

## 2018-07-08 ENCOUNTER — Encounter: Payer: Self-pay | Admitting: Hematology & Oncology

## 2018-07-08 ENCOUNTER — Inpatient Hospital Stay: Payer: Medicare Other

## 2018-07-08 ENCOUNTER — Other Ambulatory Visit: Payer: Self-pay

## 2018-07-08 ENCOUNTER — Inpatient Hospital Stay: Payer: Medicare Other | Attending: Hematology & Oncology | Admitting: Hematology & Oncology

## 2018-07-08 VITALS — BP 146/73 | HR 72 | Temp 97.7°F | Resp 20 | Wt 129.0 lb

## 2018-07-08 DIAGNOSIS — C12 Malignant neoplasm of pyriform sinus: Secondary | ICD-10-CM

## 2018-07-08 DIAGNOSIS — Z8501 Personal history of malignant neoplasm of esophagus: Secondary | ICD-10-CM | POA: Diagnosis not present

## 2018-07-08 DIAGNOSIS — Z79899 Other long term (current) drug therapy: Secondary | ICD-10-CM | POA: Insufficient documentation

## 2018-07-08 DIAGNOSIS — E032 Hypothyroidism due to medicaments and other exogenous substances: Secondary | ICD-10-CM

## 2018-07-08 LAB — CBC WITH DIFFERENTIAL (CANCER CENTER ONLY)
Abs Immature Granulocytes: 0.01 10*3/uL (ref 0.00–0.07)
Basophils Absolute: 0 10*3/uL (ref 0.0–0.1)
Basophils Relative: 0 %
Eosinophils Absolute: 0.1 10*3/uL (ref 0.0–0.5)
Eosinophils Relative: 2 %
HCT: 43.6 % (ref 39.0–52.0)
HEMOGLOBIN: 13.5 g/dL (ref 13.0–17.0)
IMMATURE GRANULOCYTES: 0 %
LYMPHS PCT: 28 %
Lymphs Abs: 0.9 10*3/uL (ref 0.7–4.0)
MCH: 27.7 pg (ref 26.0–34.0)
MCHC: 31 g/dL (ref 30.0–36.0)
MCV: 89.5 fL (ref 80.0–100.0)
MONO ABS: 0.6 10*3/uL (ref 0.1–1.0)
Monocytes Relative: 19 %
NEUTROS ABS: 1.6 10*3/uL — AB (ref 1.7–7.7)
NEUTROS PCT: 51 %
NRBC: 0 % (ref 0.0–0.2)
PLATELETS: 253 10*3/uL (ref 150–400)
RBC: 4.87 MIL/uL (ref 4.22–5.81)
RDW: 13.7 % (ref 11.5–15.5)
WBC Count: 3.2 10*3/uL — ABNORMAL LOW (ref 4.0–10.5)

## 2018-07-08 LAB — CMP (CANCER CENTER ONLY)
ALBUMIN: 3.6 g/dL (ref 3.5–5.0)
ALT: 26 U/L (ref 10–47)
AST: 41 U/L — AB (ref 11–38)
Alkaline Phosphatase: 38 U/L (ref 26–84)
Anion gap: 13 (ref 5–15)
BUN: 17 mg/dL (ref 7–22)
CALCIUM: 9.2 mg/dL (ref 8.0–10.3)
CO2: 29 mmol/L (ref 18–33)
CREATININE: 1.3 mg/dL — AB (ref 0.60–1.20)
Chloride: 99 mmol/L (ref 98–108)
GLUCOSE: 95 mg/dL (ref 73–118)
Potassium: 4.4 mmol/L (ref 3.3–4.7)
SODIUM: 141 mmol/L (ref 128–145)
TOTAL PROTEIN: 7.5 g/dL (ref 6.4–8.1)
Total Bilirubin: 0.6 mg/dL (ref 0.2–1.6)

## 2018-07-08 LAB — TSH: TSH: 1.849 u[IU]/mL (ref 0.320–4.118)

## 2018-07-08 NOTE — Progress Notes (Signed)
Hematology and Oncology Follow Up Visit  Collin Young [id number] 01-Aug-1951 67 y.o. 07/08/2018   Principle Diagnosis:  Stage II (T2 N0M0) squamous cell carcinoma of the left piriform sinus Stage IIIB squamous cell carcinoma of the esophagus-remission Congestive heart failure Stage T1c prostate cancer  Current Therapy:    Observation     Interim History:  Mr.  Young is back for followup.  Collin Young looks great.  His feeding tube is now out.  There is no issues with him having the feeding tube taken out.  Collin Young is eating well.  His weight is holding stable.  Collin Young has had no cardiac issues.  Collin Young is on multiple cardiac medications.  Collin Young apparently now has prostate cancer.  Collin Young is going to have a seed implant.  Collin Young apparently has stage T1c.  His Gleason score was 4+3.  His PSA was 9.3.  Collin Young had a prostate biopsy.  Collin Young had a 4/12 biopsies that were positive.  Collin Young has seen Dr. Karsten Ro and Dr. Tammi Klippel of radiation oncology.  Collin Young has had no bleeding.  Collin Young has had no pain.  Collin Young is on a Duragesic patch which is helping him quite a bit.  Collin Young has had no change in bowel or bladder habits.  Currently, his performance status is ECOG 2.  Medications:  Current Outpatient Medications:  .  aspirin 81 MG chewable tablet, Place 1 tablet (81 mg total) into feeding tube daily., Disp: 30 tablet, Rfl: 0 .  atorvastatin (LIPITOR) 80 MG tablet, Take 1 tablet (80 mg total) by mouth daily., Disp: 30 tablet, Rfl: 5 .  carvedilol (COREG) 6.25 MG tablet, Place 1 tablet (6.25 mg total) into feeding tube 2 (two) times daily with a meal., Disp: 60 tablet, Rfl: 0 .  fentaNYL (DURAGESIC) 12 MCG/HR, Place 1 patch (12.5 mcg total) onto the skin every 3 (three) days., Disp: 10 patch, Rfl: 0 .  isosorbide mononitrate (ISMO,MONOKET) 10 MG tablet, take 1 tab (10mg ) per tube BID, Disp: 60 tablet, Rfl: 0 .  sacubitril-valsartan (ENTRESTO) 24-26 MG, Take 1 tablet by mouth 2 (two) times daily., Disp: 180 tablet, Rfl: 3 No current  facility-administered medications for this visit.   Facility-Administered Medications Ordered in Other Visits:  .  topical emolient (BIAFINE) emulsion, , Topical, Daily, Kyung Rudd, MD  Allergies: No Known Allergies  Past Medical History, Surgical history, Social history, and Family History were reviewed and updated.  Review of Systems: Review of Systems  Constitutional: Negative.   HENT: Negative.   Eyes: Negative.   Respiratory: Negative.   Cardiovascular: Negative.   Gastrointestinal: Negative.   Genitourinary: Negative.   Musculoskeletal: Negative.   Skin: Negative.   Neurological: Negative.   Endo/Heme/Allergies: Negative.   Psychiatric/Behavioral: Negative.      Physical Exam:  weight is 129 lb (58.5 kg). His oral temperature is 97.7 F (36.5 C). His blood pressure is 146/73 (abnormal) and his pulse is 72. His respiration is 20 and oxygen saturation is 100%.   Physical Exam  Constitutional: Collin Young is oriented to person, place, and time.  HENT:  Head: Normocephalic and atraumatic.  Mouth/Throat: Oropharynx is clear and moist.  Eyes: Pupils are equal, round, and reactive to light. EOM are normal.  Neck: Normal range of motion.  Cardiovascular: Normal rate, regular rhythm and normal heart sounds.  Pulmonary/Chest: Effort normal and breath sounds normal.  Abdominal: Soft. Bowel sounds are normal.  Collin Young has a feeding tube in place.  There is no erythema or exudate from the feeding tube  entry site.  Musculoskeletal: Normal range of motion. Collin Young exhibits no edema, tenderness or deformity.  Lymphadenopathy:    Collin Young has no cervical adenopathy.  Neurological: Collin Young is alert and oriented to person, place, and time.  Skin: Skin is warm and dry. No rash noted. No erythema.  Psychiatric: Collin Young has a normal mood and affect. His behavior is normal. Judgment and thought content normal.  Vitals reviewed.   Lab Results  Component Value Date   WBC 3.2 (L) 07/08/2018   HGB 13.5 07/08/2018    HCT 43.6 07/08/2018   MCV 89.5 07/08/2018   PLT 253 07/08/2018     Chemistry      Component Value Date/Time   NA 141 07/08/2018 1006   NA 139 07/30/2017 0926   NA 137 03/10/2017 1000   K 4.4 07/08/2018 1006   K 3.9 07/30/2017 0926   K 4.3 03/10/2017 1000   CL 99 07/08/2018 1006   CL 96 (L) 07/30/2017 0926   CO2 29 07/08/2018 1006   CO2 33 07/30/2017 0926   CO2 29 03/10/2017 1000   BUN 17 07/08/2018 1006   BUN 17 07/30/2017 0926   BUN 14.7 03/10/2017 1000   CREATININE 1.30 (H) 07/08/2018 1006   CREATININE 1.4 (H) 07/30/2017 0926   CREATININE 1.1 03/10/2017 1000      Component Value Date/Time   CALCIUM 9.2 07/08/2018 1006   CALCIUM 9.6 07/30/2017 0926   CALCIUM 10.0 03/10/2017 1000   ALKPHOS 38 07/08/2018 1006   ALKPHOS 49 07/30/2017 0926   ALKPHOS 51 03/10/2017 1000   AST 41 (H) 07/08/2018 1006   AST 23 03/10/2017 1000   ALT 26 07/08/2018 1006   ALT 31 07/30/2017 0926   ALT 18 03/10/2017 1000   BILITOT 0.6 07/08/2018 1006   BILITOT 0.41 03/10/2017 1000         Impression and Plan: Collin Young is 67 year old gentleman. Collin Young had a localized recurrence of carcinoma of the left pyriform sinus. Collin Young underwent concurrent chemotherapy and radiation therapy. Collin Young finished his treatment back in April of 2015.   Has a history of squamous cell carcinoma of the esophagus. Collin Young was treated with radiation chemotherapy. This was completed back in 2010  I do not see any problems with him having treatment for the prostate cancer.  I know that Collin Young will do well and will get through it without any problems.  I think we get him back in 4 months now.  I think this would be reasonable.  I do not really see his cancer coming back as a problem.  It now now has been over 4 years.  [name], MD 11/8/201911:41 AM

## 2018-07-12 ENCOUNTER — Telehealth: Payer: Self-pay | Admitting: *Deleted

## 2018-07-12 ENCOUNTER — Other Ambulatory Visit: Payer: Self-pay | Admitting: Urology

## 2018-07-12 NOTE — Telephone Encounter (Signed)
CALLED PATIENT TO REMIND OF PRE-SEED APPTS. FOR 07-13-18, SPOKE WITH PATIENT AND HE IS AWARE OF THESE APPTS.

## 2018-07-13 ENCOUNTER — Encounter (HOSPITAL_COMMUNITY)
Admission: RE | Admit: 2018-07-13 | Discharge: 2018-07-13 | Disposition: A | Discharge status: Home / Self Care | Payer: Medicare Other | Source: Ambulatory Visit | Attending: Urology | Admitting: Urology

## 2018-07-13 ENCOUNTER — Encounter: Payer: Self-pay | Admitting: Radiation Oncology

## 2018-07-13 ENCOUNTER — Ambulatory Visit
Admission: RE | Admit: 2018-07-13 | Discharge: 2018-07-13 | Disposition: A | Discharge status: Home / Self Care | Payer: Medicare Other | Source: Ambulatory Visit | Attending: Radiation Oncology | Admitting: Radiation Oncology

## 2018-07-13 ENCOUNTER — Ambulatory Visit (HOSPITAL_COMMUNITY)
Admission: RE | Admit: 2018-07-13 | Discharge: 2018-07-13 | Disposition: A | Discharge status: Home / Self Care | Payer: Medicare Other | Source: Ambulatory Visit | Attending: Urology | Admitting: Urology

## 2018-07-13 DIAGNOSIS — C61 Malignant neoplasm of prostate: Secondary | ICD-10-CM | POA: Diagnosis not present

## 2018-07-13 DIAGNOSIS — I517 Cardiomegaly: Secondary | ICD-10-CM | POA: Diagnosis not present

## 2018-07-13 DIAGNOSIS — C12 Malignant neoplasm of pyriform sinus: Secondary | ICD-10-CM

## 2018-07-13 DIAGNOSIS — Z01818 Encounter for other preprocedural examination: Secondary | ICD-10-CM | POA: Insufficient documentation

## 2018-07-13 NOTE — Progress Notes (Signed)
   Radiation Oncology         [phone] ________________________________  Name: Collin Young       MRN: [id number]         Date: 07/13/2018                    DOB: 08/24/1951  SIMULATION AND TREATMENT PLANNING NOTE PUBIC ARCH STUDY  RD:EYCXKGY, Jori Moll, MD  No ref. provider found  DIAGNOSIS: 67 y.o. gentleman with Stage T1c adenocarcinoma of the prostate with Gleason score of 4+3, and PSA of 9.3   COMPLEX SIMULATION:  The patient presented today for evaluation for possible prostate seed implant. He was brought to the radiation planning suite and placed supine on the CT couch. A 3-dimensional image study set was obtained in upload to the planning computer. There, on each axial slice, I contoured the prostate gland. Then, using three-dimensional radiation planning tools I reconstructed the prostate in view of the structures from the transperineal needle pathway to assess for possible pubic arch interference. In doing so, I did not appreciate any pubic arch interference. Also, the patient's prostate volume was estimated based on the drawn structure. The volume was 26 cc.  Given the pubic arch appearance and prostate volume, patient remains a good candidate to proceed with prostate seed implant. Today, he freely provided informed written consent to proceed.    PLAN: The patient will undergo prostate seed implant.   ________________________________  Sheral Apley. Tammi Klippel, M.D.    This document serves as a record of services personally performed by Tyler Pita, MD. It was created on his behalf by Wilburn Mylar, a trained medical scribe. The creation of this record is based on the scribe's personal observations and the provider's statements to them. This document has been checked and approved by the attending provider.

## 2018-07-13 NOTE — Progress Notes (Signed)
  Radiation Oncology         [phone] ________________________________  Name: Collin Young MRN: [id number]  Date: 07/13/2018  DOB: [birth year]  SIMULATION AND TREATMENT PLANNING NOTE PUBIC ARCH STUDY  ZH:GDJMEQA, Jori Moll, MD  No ref. provider found  DIAGNOSIS: 67 y.o. gentleman with Stage T1c adenocarcinoma of the prostate with Gleason score of 4+3, and PSA of 9.3   COMPLEX SIMULATION:  The patient presented today for evaluation for possible prostate seed implant. He was brought to the radiation planning suite and placed supine on the CT couch. A 3-dimensional image study set was obtained in upload to the planning computer. There, on each axial slice, I contoured the prostate gland. Then, using three-dimensional radiation planning tools I reconstructed the prostate in view of the structures from the transperineal needle pathway to assess for possible pubic arch interference. In doing so, I did not appreciate any pubic arch interference. Also, the patient's prostate volume was estimated based on the drawn structure. The volume was 26 cc.  Given the pubic arch appearance and prostate volume, patient remains a good candidate to proceed with prostate seed implant. Today, he freely provided informed written consent to proceed.    PLAN: The patient will undergo prostate seed implant.   ________________________________  Sheral Apley. Tammi Klippel, M.D.

## 2018-07-14 ENCOUNTER — Encounter: Payer: Self-pay | Admitting: Medical Oncology

## 2018-07-14 NOTE — Pre-Procedure Instructions (Signed)
EKG results 07/13/2018 sent to Dr. Karsten Ro via epic.

## 2018-07-18 ENCOUNTER — Other Ambulatory Visit: Payer: Self-pay | Admitting: *Deleted

## 2018-07-18 MED ORDER — FENTANYL 12 MCG/HR TD PT72: 12.5000 ug | MEDICATED_PATCH | TRANSDERMAL | 0 refills | Status: DC

## 2018-08-09 ENCOUNTER — Encounter: Payer: Self-pay | Admitting: Cardiovascular Disease

## 2018-08-09 ENCOUNTER — Ambulatory Visit (INDEPENDENT_AMBULATORY_CARE_PROVIDER_SITE_OTHER): Payer: Medicare Other | Admitting: Cardiovascular Disease

## 2018-08-09 VITALS — BP 105/60 | HR 84 | Ht 65.0 in | Wt 138.6 lb

## 2018-08-09 DIAGNOSIS — I2510 Atherosclerotic heart disease of native coronary artery without angina pectoris: Secondary | ICD-10-CM | POA: Diagnosis not present

## 2018-08-09 DIAGNOSIS — I952 Hypotension due to drugs: Secondary | ICD-10-CM

## 2018-08-09 DIAGNOSIS — I351 Nonrheumatic aortic (valve) insufficiency: Secondary | ICD-10-CM | POA: Diagnosis not present

## 2018-08-09 DIAGNOSIS — I5042 Chronic combined systolic (congestive) and diastolic (congestive) heart failure: Secondary | ICD-10-CM | POA: Diagnosis not present

## 2018-08-09 DIAGNOSIS — E785 Hyperlipidemia, unspecified: Secondary | ICD-10-CM

## 2018-08-09 MED ORDER — CARVEDILOL 3.125 MG PO TABS: 3.1250 mg | ORAL_TABLET | Freq: Two times a day (BID) | ORAL | 1 refills | Status: DC

## 2018-08-09 NOTE — Progress Notes (Signed)
Cardiology Office Note   Date:  08/09/2018   ID:  Collin Young, DOB 1951/08/25, MRN [id number]  PCP:  [name], MD  Cardiologist:   Skeet Latch, MD   No chief complaint on file.     History of Present Illness: Collin Young is a 67 y.o. 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  Collin Young was admitted 12/2016 with progressive shortness of breath.  BNP was >2000.  Echo revealed LVEF 10-15% with diffuse hypokinesis, moderate MR and moderate AR.  PASP was 49 mmHg.  He underwent left heart catheterization and was found to have 100% proximal LCx with L-->L collaterals, 99% prox-mid RCA and otherwise mild-moderate CAD.  There was consideration of RCA PCI.  However, this would have required hemodynamic support and was deferred given his frailty and comorbidities.  He was treated with milrinone and IV lasix.  Milrinone was weaned prior to discharge.  During that hospitalization he was also treated for MSSA bacteremia.  He was unable to get a TEE due to his prior cancer.  His Port was removed and a PICC line was placed.   He had a  PEG tube placed due to frank aspiration.  At his last appointment Collin Young was referred for an echo 05/2017 that showed a significant improvement in his LVEF to 45-50% and grade 1 diastolic dysfunction.  There was moderate aortic regurgitation.    Collin Young continues to do well.  He has not had any chest pain.  His breathing has been stable.  He denies lower extremity edema, orthopnea, or PND.  He continues to work and has no exertional symptoms.  He was recently diagnosed with prostate cancer.  Collin Young will be going for prostate seed implant with Dr. Tammi Klippel.  At some point carvedilol was discontinued.  His PCP recently started him back on 6.25 mg twice daily.   Since then he has been feeling dizzy.  He takes 1 tablet in the morning but often does not take the evening dose.  He denies any syncope.   Past Medical History:  Diagnosis Date  . Cancer [facility])    Esophagus- radiation 2011  . GERD (gastroesophageal reflux disease)    takes zantac prn  . Headache   . History of radiation therapy 10/19/2013-12/05/2013   pyriform sinus 69.96 Gy/6fx  . HOH (hard of hearing)   . Hyperlipemia   . Hypertension   . Prostate cancer ([place])   . Seizures ([place])    alcohol withdrawls- 2001    Past Surgical History:  Procedure Laterality Date  . CARDIOVASCULAR STRESS TEST  10/09/09   normal nuclearr stress test, EF 57% [name])  . IR GASTROSTOMY TUBE MOD SED  01/13/2017  . IR GASTROSTOMY TUBE REMOVAL  02/03/2018  . IR PATIENT EVAL TECH 0-60 MINS  03/25/2017  . IR REMOVAL TUN ACCESS W/ PORT W/O FL MOD SED  01/15/2017  . IR REPLACE G-TUBE SIMPLE WO FLUORO  01/13/2018  . LARYNGOSCOPY N/A 09/15/2013   Procedure: LARYNGOSCOPY;  Surgeon: [name], MD;  Location: [place];  Service: ENT;  Laterality: N/A;  direct laryngoscopy with biopsy and esophagoscopy  . NO PAST SURGERIES    . RIGHT/LEFT HEART CATH AND CORONARY ANGIOGRAPHY N/A 01/05/2017   Procedure: Right/Left Heart Cath and Coronary Angiography;  Surgeon: [name],  MD;  Location: [place] [facility];  Service: Cardiovascular;  Laterality: N/A;     Current Outpatient Medications  Medication Sig Dispense Refill  . aspirin EC 81 MG tablet Take 81 mg by mouth daily.    Marland Kitchen atorvastatin (LIPITOR) 80 MG tablet Take 1 tablet (80 mg total) by mouth daily. 30 tablet 5  . carvedilol (COREG) 3.125 MG tablet Take 1 tablet (3.125 mg total) by mouth 2 (two) times daily with a meal. 180 tablet 1  . fentaNYL (DURAGESIC) 12 MCG/HR Place 1 patch (12.5 mcg total) onto the skin every 3 (three) days. 10 patch 0  . isosorbide mononitrate (ISMO,MONOKET) 10 MG tablet take 1 tab (10mg ) per tube BID 60 tablet 0  .  sacubitril-valsartan (ENTRESTO) 24-26 MG Take 1 tablet by mouth 2 (two) times daily. 180 tablet 3   No current facility-administered medications for this visit.    Facility-Administered Medications Ordered in Other Visits  Medication Dose Route Frequency Provider Last Rate Last Dose  . topical emolient (BIAFINE) emulsion   Topical Daily Kyung Rudd, MD        Allergies:   Patient has no known allergies.    Social History:  The patient  reports that he quit smoking about 8 years ago. His smoking use included cigarettes. He started smoking about 57 years ago. He has a 40.00 pack-year smoking history. He quit smokeless tobacco use about 18 years ago.  His smokeless tobacco use included chew. He reports that he does not drink alcohol or use drugs.   Family History:  The patient's family history includes Breast cancer in his mother.    ROS:  Please see the history of present illness.   Otherwise, review of systems are positive for none.   All other systems are reviewed and negative.    PHYSICAL EXAM: VS:  BP 105/60   Pulse 84   Ht 5\' 5"  (1.651 m)   Wt 138 lb 9.6 oz (62.9 kg)   BMI 23.06 kg/m  , BMI Body mass index is 23.06 kg/m. GENERAL:  Well appearing HEENT: Pupils equal round and reactive, fundi not visualized, oral mucosa unremarkable NECK:  No jugular venous distention, waveform within normal limits, carotid upstroke brisk and symmetric, no bruits LUNGS:  Clear to auscultation bilaterally HEART:  RRR.  PMI not displaced or sustained,S1 and S2 within normal limits, no S3, no S4, no clicks, no rubs, no murmurs ABD:  Flat, positive bowel sounds normal in frequency in pitch, no bruits, no rebound, no guarding, no midline pulsatile mass, no hepatomegaly, no splenomegaly EXT:  2 plus pulses throughout, no edema, no cyanosis no clubbing SKIN:  No rashes no nodules NEURO:  Cranial nerves II through XII grossly intact, motor grossly intact throughout PSYCH:  Cognitively intact, oriented  to person place and time   EKG:  EKG is not ordered today. Sinus rhythm.  Rate 83 bpm.  Lateral TWI.   Echo 05/06/17: Study Conclusions  - Left ventricle: Diffuse hypokinesis slightly worse in inferior   wall. The cavity size was mildly dilated. Systolic function was   mildly reduced. The estimated ejection fraction was in the range   of 45% to 50%. Doppler parameters are consistent with abnormal   left ventricular relaxation (grade 1 diastolic dysfunction). - Aortic valve: Severely calcified non coronary cusp. There was   moderate regurgitation. - Mitral valve: There was mild regurgitation. - Left atrium: The atrium was mildly dilated. - Atrial septum: No defect or patent foramen ovale was  identified.  Echo 01/03/17: Study Conclusions  - Left ventricle: The cavity size was normal. Wall thickness was   normal. The estimated ejection fraction was in the range of 10%   to 15%. Diffuse hypokinesis. DIastolic function is abnormal,   indeterminant grade. - Aortic valve: There was moderate regurgitation. Valve area (VTI):   1.08 cm^2. Valve area (Vmax): 1.5 cm^2. Valve area (Vmean): 1.5   cm^2. - Mitral valve: There was moderate regurgitation. - Left atrium: The atrium was mildly dilated. - Right ventricle: Systolic function was mildly to moderately   reduced. - Tricuspid valve: There was moderate regurgitation. - Pulmonary arteries: Systolic pressure was moderately increased.   PA peak pressure: 49 mm Hg (S).  LHC 01/05/17:  Prox RCA to Mid RCA lesion, 99 %stenosed.  Mid RCA lesion, 30 %stenosed.  Dist RCA lesion, 40 %stenosed.  RPDA lesion, 20 %stenosed.  Prox Cx lesion, 100 %stenosed.  Ost Ramus to Ramus lesion, 50 %stenosed.  Ost 3rd Diag to 3rd Diag lesion, 30 %stenosed.  Mid LAD lesion, 40 %stenosed.  Ost LAD to Prox LAD lesion, 20 %stenosed.  Ost LM to LM lesion, 40 %stenosed.  Hemodynamic findings consistent with mild pulmonary hypertension.   1. Severe  calcific disease in the proximal to mid RCA 2. 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 Moderate left main and mid LAD stenosis 4. Elevated filling pressures   Recent Labs: 07/08/2018: ALT 26; BUN 17; Creatinine 1.30; Hemoglobin 13.5; Platelet Count 253; Potassium 4.4; Sodium 141; TSH 1.849    Lipid Panel    Component Value Date/Time   CHOL 159 02/21/2018 0817   TRIG 40 02/21/2018 0817   HDL 71 02/21/2018 0817   CHOLHDL 2.2 02/21/2018 0817   LDLCALC 80 02/21/2018 0817      Wt Readings from Last 3 Encounters:  08/09/18 138 lb 9.6 oz (62.9 kg)  07/08/18 129 lb (58.5 kg)  06/15/18 130 lb 9.6 oz (59.2 kg)     ASSESSMENT AND PLAN:  # Chronic systolic and diastolic heart failure:  # Moderate MR: # Moderate AR:  LVEF significantly improved from 10-15% to 45-50%.  He is doing extremely well and remains asymptomatic. Continue Entresto and Imdur.  We will reduce carvedilol to 3.125mg  due to dizziness.  Likely ischemic cardiomyopathy.    # Obstructive CAD:  # Hyperlipidemia:  Medically managed due to acute illness at the time of diagnosis.  100% LCx and 99% RCA.  The RCA would require hemodynamic support.  He has no chest pain so we will continue medical management for now.  He continues to be very active.  Continue aspirin, carvedilol, Imdur and atorvastatin.    Current medicines are reviewed at length with the patient today.  The patient does not have concerns regarding medicines.  The following changes have been made:  none  Labs/ tests ordered today include:  No orders of the defined types were placed in this encounter.    Disposition:   FU with Hubbert Landrigan C. Oval Linsey, MD, [facility] in 3 months.    Signed, Shavelle Runkel C. Oval Linsey, MD, [facility]  08/09/2018 11:00 AM    [facility]

## 2018-08-09 NOTE — Patient Instructions (Signed)
Medication Instructions:  DECREASE YOUR CARVEDILOL TO 3.125 MG TWICE A DAY   If you need a refill on your cardiac medications before your next appointment, please call your pharmacy.   Lab work: NONE  Testing/Procedures: NONE  Follow-Up: At [facility], you and your health needs are our priority.  As part of our continuing mission to provide you with exceptional heart care, we have created designated Provider Care Teams.  These Care Teams include your primary Cardiologist (physician) and Advanced Practice Providers (APPs -  Physician Assistants and Nurse Practitioners) who all work together to provide you with the care you need, when you need it. You will need a follow up appointment in 3 months. You may see DR [facility]  or one of the following Advanced Practice Providers on your designated Care Team:   Kerin Ransom, [place]

## 2018-08-18 ENCOUNTER — Other Ambulatory Visit: Payer: Self-pay | Admitting: *Deleted

## 2018-08-18 MED ORDER — FENTANYL 12 MCG/HR TD PT72: 12.5000 ug | MEDICATED_PATCH | TRANSDERMAL | 0 refills | Status: DC

## 2018-08-26 ENCOUNTER — Other Ambulatory Visit: Payer: Self-pay | Admitting: Urology

## 2018-08-26 DIAGNOSIS — C61 Malignant neoplasm of prostate: Secondary | ICD-10-CM

## 2018-08-30 ENCOUNTER — Ambulatory Visit: Payer: Self-pay | Admitting: Family Medicine

## 2018-09-07 ENCOUNTER — Other Ambulatory Visit: Payer: Self-pay

## 2018-09-07 ENCOUNTER — Encounter (HOSPITAL_BASED_OUTPATIENT_CLINIC_OR_DEPARTMENT_OTHER): Payer: Self-pay | Admitting: *Deleted

## 2018-09-07 NOTE — Progress Notes (Addendum)
Spoke w/ pt and pt daughter, tracey, via phone for pre-op interview.  Npo after mn.   Arrive at 0730.  Getting lab work done 09-16-2017 (cbc, cmet, pt/inr, ptt).  Current ekg and cxr in chart and epic.  Will take am meds w/ sips of water dos and do one fleet enema.  Cardiologist lov note in chart and epic, dated 08-09-2018.  Chart given to anesthesia Konrad Felix PA for review.  ADDENDUM:  Cardiac Telephone clearance in epic dated 09-12-2018, printed and placed with chart.

## 2018-09-08 NOTE — Progress Notes (Addendum)
Anesthesia Chart Review   Case:  [id number] Date/Time:  09/23/18 0930   Procedure:  RADIOACTIVE SEED IMPLANT/BRACHYTHERAPY IMPLANT (N/A )   Anesthesia type:  General   Pre-op diagnosis:  PROSTATE CANCER   Location:  [facility] OR ROOM 3 / [place]   Surgeon:  [name], MD      DISCUSSION: 68 yo former smoker (40 pack years, quit 08/31/09) with h/o HTN, CAD (medically managed), GERD (since Gtube removal 6/19), HLD< CHF, Esophageal cancer along with piriform sinus cancer (completed chemoradiation 2015), COPD, h/o alcohol abuse (quit 2001), prostate cancer scheduled for above surgery on 09/23/18 by Dr. [name].   Cardiac clearance requested.  Per Melina Copa, PA-C, "Collin Young was last seen on 08/09/18 by Dr. Oval Linsey. Very complex PMH as outlined - CHF requiring prior milrinone, CAD managed medically. Dr. Oval Linsey references the need for seed implant in note but no specific further cardiac testing felt necessary at that time. Given complex PMH will route to Dr. Oval Linsey for input on clearance and whether or not patient can hold ASA for procedure. (Not explicitly stated, but just in case surgeon decides it needs to be held, to prevent a delay in clearance finalization.)."  Await input from Dr. Oval Linsey for cardiac clearance.  Addendum 09/21/18:  Per Dr. Oval Linsey on 09/09/18, "Collin Young is doing fantastic and he is at acceptable risk for seed implant. I would rather he not hold aspirin unless he has to. If he does, would hold for no more than 48 hours."  Discussed patient with Dr. Gifford Shave 09/08/17.  Ok to proceed with planned procedure barring acute status change.  VS: There were no vitals taken for this visit.  PROVIDERS: [name], MD   LABS: Labs reviewed: Acceptable for surgery. (all labs ordered are listed, but only abnormal results are displayed)  Labs Reviewed - No data to display Creatinine 1.3   IMAGES: Chest Xray 07/13/18 FINDINGS: Interim removal of  right PICC line. Mediastinum and hilar structures are normal. Lungs are clear. No pleural effusion or pneumothorax. Interim clearing of previously identified pulmonary edema bilateral pleural effusions. Biapical pleural-parenchymal thickening consistent with scarring. Diffuse osteopenia degenerative change thoracic spine. Stable mild thoracic spine compression fractures. No acute or focal bony abnormality otherwise noted.  IMPRESSION: No acute or focal cardiopulmonary disease.  EKG: 07/13/18 Rate 82bpm Normal sinus rhythm Nonspecific T wave abnormality Left ventricular hypertrophy, new since previous tracing Left atrial abnormality  CV: Echo 05/06/17 Study Conclusions  - Left ventricle: Diffuse hypokinesis slightly worse in inferior   wall. The cavity size was mildly dilated. Systolic function was   mildly reduced. The estimated ejection fraction was in the range   of 45% to 50%. Doppler parameters are consistent with abnormal   left ventricular relaxation (grade 1 diastolic dysfunction). - Aortic valve: Severely calcified non coronary cusp. There was   moderate regurgitation. - Mitral valve: There was mild regurgitation. - Left atrium: The atrium was mildly dilated. - Atrial septum: No defect or patent foramen ovale was identified.  Heart Cath 01/05/17  Prox RCA to Mid RCA lesion, 99 %stenosed.  Mid RCA lesion, 30 %stenosed.  Dist RCA lesion, 40 %stenosed.  RPDA lesion, 20 %stenosed.  Prox Cx lesion, 100 %stenosed.  Ost Ramus to Ramus lesion, 50 %stenosed.  Ost 3rd Diag to 3rd Diag lesion, 30 %stenosed.  Mid LAD lesion, 40 %stenosed.  Ost LAD to Prox LAD lesion, 20 %stenosed.  Ost LM to LM lesion, 40 %stenosed.  Hemodynamic findings  consistent with mild pulmonary hypertension.   1. Severe calcific disease in the proximal to mid RCA 2. 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 Moderate left main and mid LAD stenosis 4. Elevated filling  pressures  Past Medical History:  Diagnosis Date  . Chronic combined systolic and diastolic CHF (congestive heart failure) ([place])    followed by [name]  . COPD (chronic obstructive pulmonary disease) ([place])   . Coronary artery disease cardiologist-- [name]   per cardiac cath 01-05-2017  chronic total occlusion pLCx with collaterals, 99% severe calcified prox. to mid RCA, otherwise mild to moderate CAD (medically managed)  . Esophageal cancer, stage IIIB [facility]) oncologist-  dr ennever/  dr moody   dx 2010  SCC Stage IIIB completed chemoradiation;  localized recurrent left piriform sinus 01/ 2015,  completed concurrent chemoradiatoin 04/ 2015  . GERD (gastroesophageal reflux disease)    09-07-2018   no issues since Gtube removed 06/ 2019  . Headache   . History of alcohol abuse    quit 2001  . History of cancer chemotherapy    2010;   2015  . History of radiation therapy    10-19-2013 to  12-05-2013 pyriform sinus 69.96 Gy/51fx;   Radiation completed 2010 for esophageal cancer  . History of seizure 2001   alcohol withdrawal  . HOH (hard of hearing)   . Hyperlipidemia   . Hypertension   . Ischemic cardiomyopathy 12/2016   01-03-2017  echo,  ef 10-15%/   echo 05-06-2017 EF improved to 45-50%  . Lower urinary tract symptoms (LUTS)   . Prostate cancer [facility]) urologist-  dr ottelin/  oncologist-- [name]   dx 05-27-2018--- Stage T1c,  Gleason 4+3,  PSA 9.3--  scheduled for brachytherapy 09-23-2017  . Renal cyst, left   . Voice hoarseness    secondary to radiation treatment    Past Surgical History:  Procedure Laterality Date  . CARDIOVASCULAR STRESS TEST  10/09/09   normal nuclearr stress test, EF 57% [name])  . IR GASTROSTOMY TUBE MOD SED  01/13/2017  . IR GASTROSTOMY TUBE REMOVAL  02/03/2018  . IR PATIENT EVAL TECH 0-60 MINS  03/25/2017  . IR REMOVAL TUN ACCESS W/ PORT W/O FL MOD SED  01/15/2017  . IR REPLACE G-TUBE SIMPLE WO FLUORO  01/13/2018  . LARYNGOSCOPY  N/A 09/15/2013   Procedure: LARYNGOSCOPY;  Surgeon: [name], MD;  Location: [place];  Service: ENT;  Laterality: N/A;  direct laryngoscopy with biopsy and esophagoscopy  . RIGHT/LEFT HEART CATH AND CORONARY ANGIOGRAPHY N/A 01/05/2017   Procedure: Right/Left Heart Cath and Coronary Angiography;  Surgeon: [name], MD;  Location: [place] [facility];  Service: Cardiovascular;  Laterality: N/A;  . TRANSTHORACIC ECHOCARDIOGRAM  05/06/2017   ef 45-50%,  grade 1 diastolic dysfunction/  AV severe calcified non coronary cusp with moderate regurg. , no stenosis (valve area per echo 01-05-2017 1.08cm^2)/  mild MR, TR, and PR/  mild LAE    MEDICATIONS: . aspirin EC 81 MG tablet  . atorvastatin (LIPITOR) 80 MG tablet  . carvedilol (COREG) 3.125 MG tablet  . fentaNYL (DURAGESIC) 12 MCG/HR  . isosorbide mononitrate (ISMO,MONOKET) 10 MG tablet  . sacubitril-valsartan (ENTRESTO) 24-26 MG   No current facility-administered medications for this encounter.    . topical emolient (BIAFINE) emulsion    Maia Plan [facility] Pre-Surgical Testing [phone] 09/15/18 12:54 PM

## 2018-09-09 ENCOUNTER — Telehealth: Payer: Self-pay | Admitting: Cardiovascular Disease

## 2018-09-09 NOTE — Telephone Encounter (Signed)
New Message           [facility] Pre-operative Risk Assessment    Request for surgical clearance:  1. What type of surgery is being performed? Radioactive Seed implant with space OAR instillation  2. When is this surgery scheduled? 09/23/2018  3. What type of clearance is required (medical clearance vs. Pharmacy clearance to hold med vs. Both)? Both  4. Are there any medications that need to be held prior to surgery and how long?None  5. Practice name and name of physician performing surgery? [facility]   6. What is your office phone number [phone]   7.   What is your office fax number [phone]  8.   Anesthesia type (None, local, MAC, general) ?General   Porfirio Mylar 09/09/2018, 10:27 AM  _________________________________________________________________   (provider comments below)

## 2018-09-12 NOTE — Telephone Encounter (Signed)
   Primary Cardiologist: Skeet Latch, MD  Chart reviewed as part of pre-operative protocol coverage. Patient was contacted 09/12/2018 in reference to pre-operative risk assessment for pending surgery as outlined below.  Collin Young was last seen on 08/09/18 by Dr. Oval Linsey. Very complex PMH as outlined - CHF requiring prior milrinone, CAD managed medically. Dr. Oval Linsey references the need for seed implant in note but no specific further cardiac testing felt necessary at that time. Given complex PMH will route to Dr. Oval Linsey for input on clearance and whether or not patient can hold ASA for procedure. (Not explicitly stated, but just in case surgeon decides it needs to be held, to prevent a delay in clearance finalization.) Dr. Oval Linsey - - Please route response to P CV DIV PREOP (the pre-op pool). Thank you.   Charlie Pitter, PA-C 09/12/2018, 5:14 PM

## 2018-09-15 ENCOUNTER — Telehealth: Payer: Self-pay | Admitting: *Deleted

## 2018-09-15 NOTE — Telephone Encounter (Signed)
CALLED PATIENT TO REMIND OF LAB APPT. FOR 09-16-18 ARRIVAL TIME - 12:45 PM @ [facility] ADMITTING, SPOKE WITH PATIENT AND HE IS AWARE OF THIS APPT.

## 2018-09-16 ENCOUNTER — Encounter (HOSPITAL_COMMUNITY)
Admission: RE | Admit: 2018-09-16 | Discharge: 2018-09-16 | Disposition: A | Discharge status: Home / Self Care | Payer: Medicare Other | Source: Ambulatory Visit | Attending: Urology | Admitting: Urology

## 2018-09-16 DIAGNOSIS — Z01818 Encounter for other preprocedural examination: Secondary | ICD-10-CM | POA: Diagnosis not present

## 2018-09-16 LAB — CBC
HCT: 43.8 % (ref 39.0–52.0)
Hemoglobin: 13.5 g/dL (ref 13.0–17.0)
MCH: 27.7 pg (ref 26.0–34.0)
MCHC: 30.8 g/dL (ref 30.0–36.0)
MCV: 89.8 fL (ref 80.0–100.0)
Platelets: 234 10*3/uL (ref 150–400)
RBC: 4.88 MIL/uL (ref 4.22–5.81)
RDW: 13.5 % (ref 11.5–15.5)
WBC: 2.4 10*3/uL — ABNORMAL LOW (ref 4.0–10.5)
nRBC: 0 % (ref 0.0–0.2)

## 2018-09-16 LAB — COMPREHENSIVE METABOLIC PANEL
ALT: 16 U/L (ref 0–44)
AST: 25 U/L (ref 15–41)
Albumin: 3.7 g/dL (ref 3.5–5.0)
Alkaline Phosphatase: 38 U/L (ref 38–126)
Anion gap: 9 (ref 5–15)
BUN: 17 mg/dL (ref 8–23)
CO2: 28 mmol/L (ref 22–32)
Calcium: 8.8 mg/dL — ABNORMAL LOW (ref 8.9–10.3)
Chloride: 101 mmol/L (ref 98–111)
Creatinine, Ser: 1.46 mg/dL — ABNORMAL HIGH (ref 0.61–1.24)
GFR calc Af Amer: 57 mL/min — ABNORMAL LOW (ref >60)
GFR calc non Af Amer: 49 mL/min — ABNORMAL LOW (ref >60)
Glucose, Bld: 132 mg/dL — ABNORMAL HIGH (ref 70–99)
Potassium: 4 mmol/L (ref 3.5–5.1)
Sodium: 138 mmol/L (ref 135–145)
Total Bilirubin: 0.6 mg/dL (ref 0.3–1.2)
Total Protein: 7 g/dL (ref 6.5–8.1)

## 2018-09-16 LAB — APTT: aPTT: 30 seconds (ref 24–36)

## 2018-09-16 LAB — PROTIME-INR
INR: 0.93
Prothrombin Time: 12.4 seconds (ref 11.4–15.2)

## 2018-09-16 NOTE — Progress Notes (Signed)
CMP result dated 09-16-2018 routed  to dr Karsten Ro in epic.

## 2018-09-22 ENCOUNTER — Telehealth: Payer: Self-pay | Admitting: *Deleted

## 2018-09-22 NOTE — Telephone Encounter (Signed)
CALLED PATIENT TO INFORM OF PROCEDURE FOR 09-23-18 SPOKE WITH PATIENT AND HE IS AWARE OF THIS PROCEDURE.

## 2018-09-23 ENCOUNTER — Ambulatory Visit (HOSPITAL_BASED_OUTPATIENT_CLINIC_OR_DEPARTMENT_OTHER)
Admission: RE | Admit: 2018-09-23 | Discharge: 2018-09-23 | Disposition: A | Discharge status: Home / Self Care | Payer: Medicare Other | Source: Ambulatory Visit | Attending: Urology | Admitting: Urology

## 2018-09-23 ENCOUNTER — Encounter (HOSPITAL_BASED_OUTPATIENT_CLINIC_OR_DEPARTMENT_OTHER)
Admission: RE | Disposition: A | Discharge status: Home / Self Care | Payer: Self-pay | Source: Ambulatory Visit | Attending: Urology

## 2018-09-23 ENCOUNTER — Ambulatory Visit (HOSPITAL_BASED_OUTPATIENT_CLINIC_OR_DEPARTMENT_OTHER): Payer: Medicare Other | Admitting: Physician Assistant

## 2018-09-23 ENCOUNTER — Encounter (HOSPITAL_BASED_OUTPATIENT_CLINIC_OR_DEPARTMENT_OTHER): Payer: Self-pay

## 2018-09-23 ENCOUNTER — Ambulatory Visit (HOSPITAL_BASED_OUTPATIENT_CLINIC_OR_DEPARTMENT_OTHER): Payer: Medicare Other | Admitting: Anesthesiology

## 2018-09-23 ENCOUNTER — Ambulatory Visit (HOSPITAL_COMMUNITY): Payer: Medicare Other

## 2018-09-23 DIAGNOSIS — Z8501 Personal history of malignant neoplasm of esophagus: Secondary | ICD-10-CM | POA: Insufficient documentation

## 2018-09-23 DIAGNOSIS — I110 Hypertensive heart disease with heart failure: Secondary | ICD-10-CM | POA: Insufficient documentation

## 2018-09-23 DIAGNOSIS — I509 Heart failure, unspecified: Secondary | ICD-10-CM | POA: Insufficient documentation

## 2018-09-23 DIAGNOSIS — Z7982 Long term (current) use of aspirin: Secondary | ICD-10-CM | POA: Insufficient documentation

## 2018-09-23 DIAGNOSIS — Z87891 Personal history of nicotine dependence: Secondary | ICD-10-CM | POA: Diagnosis not present

## 2018-09-23 DIAGNOSIS — K219 Gastro-esophageal reflux disease without esophagitis: Secondary | ICD-10-CM | POA: Diagnosis not present

## 2018-09-23 DIAGNOSIS — Z79899 Other long term (current) drug therapy: Secondary | ICD-10-CM | POA: Insufficient documentation

## 2018-09-23 DIAGNOSIS — Z923 Personal history of irradiation: Secondary | ICD-10-CM | POA: Insufficient documentation

## 2018-09-23 DIAGNOSIS — Z8249 Family history of ischemic heart disease and other diseases of the circulatory system: Secondary | ICD-10-CM | POA: Insufficient documentation

## 2018-09-23 DIAGNOSIS — C61 Malignant neoplasm of prostate: Secondary | ICD-10-CM | POA: Insufficient documentation

## 2018-09-23 DIAGNOSIS — N400 Enlarged prostate without lower urinary tract symptoms: Secondary | ICD-10-CM | POA: Insufficient documentation

## 2018-09-23 DIAGNOSIS — N529 Male erectile dysfunction, unspecified: Secondary | ICD-10-CM | POA: Insufficient documentation

## 2018-09-23 HISTORY — DX: Alcohol abuse, in remission: F10.11

## 2018-09-23 HISTORY — DX: Chronic obstructive pulmonary disease, unspecified: J44.9

## 2018-09-23 HISTORY — PX: RADIOACTIVE SEED IMPLANT: SHX5150

## 2018-09-23 HISTORY — DX: Hyperlipidemia, unspecified: E78.5

## 2018-09-23 HISTORY — DX: Atherosclerotic heart disease of native coronary artery without angina pectoris: I25.10

## 2018-09-23 HISTORY — DX: Personal history of antineoplastic chemotherapy: Z92.21

## 2018-09-23 HISTORY — DX: Malignant neoplasm of esophagus, unspecified: C15.9

## 2018-09-23 HISTORY — DX: Ischemic cardiomyopathy: I25.5

## 2018-09-23 HISTORY — DX: Cyst of kidney, acquired: N28.1

## 2018-09-23 HISTORY — DX: Chronic combined systolic (congestive) and diastolic (congestive) heart failure: I50.42

## 2018-09-23 HISTORY — DX: Dysphonia: R49.0

## 2018-09-23 HISTORY — DX: Unspecified symptoms and signs involving the genitourinary system: R39.9

## 2018-09-23 HISTORY — DX: Personal history of other specified conditions: Z87.898

## 2018-09-23 SURGERY — INSERTION, RADIATION SOURCE, PROSTATE
Anesthesia: General | Site: Prostate

## 2018-09-23 MED ORDER — ONDANSETRON HCL 4 MG/2ML IJ SOLN
INTRAMUSCULAR | Status: DC | PRN
  Administered 2018-09-23: 4 mg via INTRAVENOUS

## 2018-09-23 MED ORDER — MIDAZOLAM HCL 5 MG/5ML IJ SOLN
INTRAMUSCULAR | Status: DC | PRN
  Administered 2018-09-23 (×2): 0.5 mg via INTRAVENOUS

## 2018-09-23 MED ORDER — CIPROFLOXACIN IN D5W 400 MG/200ML IV SOLN
INTRAVENOUS | Status: AC
  Filled 2018-09-23: qty 200

## 2018-09-23 MED ORDER — LIDOCAINE 2% (20 MG/ML) 5 ML SYRINGE
INTRAMUSCULAR | Status: AC
  Filled 2018-09-23: qty 5

## 2018-09-23 MED ORDER — SODIUM CHLORIDE 0.9 % IV SOLN
INTRAVENOUS | Status: AC | PRN
  Administered 2018-09-23: 1000 mL

## 2018-09-23 MED ORDER — SUCCINYLCHOLINE CHLORIDE 200 MG/10ML IV SOSY
PREFILLED_SYRINGE | INTRAVENOUS | Status: DC | PRN
  Administered 2018-09-23: 100 mg via INTRAVENOUS
  Administered 2018-09-23: 60 mg via INTRAVENOUS

## 2018-09-23 MED ORDER — TAMSULOSIN HCL 0.4 MG PO CAPS: 0.4000 mg | ORAL_CAPSULE | ORAL | 0 refills | Status: DC

## 2018-09-23 MED ORDER — FENTANYL CITRATE (PF) 100 MCG/2ML IJ SOLN
25.0000 ug | INTRAMUSCULAR | Status: DC | PRN
  Filled 2018-09-23: qty 1

## 2018-09-23 MED ORDER — SODIUM CHLORIDE 0.9 % IV SOLN
INTRAVENOUS | Status: DC | PRN
  Administered 2018-09-23: 100 ug/min via INTRAVENOUS

## 2018-09-23 MED ORDER — SUGAMMADEX SODIUM 200 MG/2ML IV SOLN
INTRAVENOUS | Status: DC | PRN
  Administered 2018-09-23: 300 mg via INTRAVENOUS

## 2018-09-23 MED ORDER — SUGAMMADEX SODIUM 200 MG/2ML IV SOLN
INTRAVENOUS | Status: AC
  Filled 2018-09-23: qty 2

## 2018-09-23 MED ORDER — IOHEXOL 300 MG/ML  SOLN
INTRAMUSCULAR | Status: DC | PRN
  Administered 2018-09-23: 7 mL

## 2018-09-23 MED ORDER — FENTANYL CITRATE (PF) 100 MCG/2ML IJ SOLN
INTRAMUSCULAR | Status: AC
  Filled 2018-09-23: qty 2

## 2018-09-23 MED ORDER — CIPROFLOXACIN IN D5W 400 MG/200ML IV SOLN
400.0000 mg | INTRAVENOUS | Status: AC
  Administered 2018-09-23: 400 mg via INTRAVENOUS
  Filled 2018-09-23: qty 200

## 2018-09-23 MED ORDER — PHENYLEPHRINE HCL 10 MG/ML IJ SOLN
INTRAMUSCULAR | Status: AC
  Filled 2018-09-23: qty 2

## 2018-09-23 MED ORDER — ONDANSETRON HCL 4 MG/2ML IJ SOLN
INTRAMUSCULAR | Status: AC
  Filled 2018-09-23: qty 2

## 2018-09-23 MED ORDER — HYDROCODONE-ACETAMINOPHEN 10-325 MG PO TABS: 1.0000 | ORAL_TABLET | ORAL | 0 refills | Status: DC | PRN

## 2018-09-23 MED ORDER — PHENYLEPHRINE 40 MCG/ML (10ML) SYRINGE FOR IV PUSH (FOR BLOOD PRESSURE SUPPORT)
PREFILLED_SYRINGE | INTRAVENOUS | Status: DC | PRN
  Administered 2018-09-23 (×2): 160 ug via INTRAVENOUS
  Administered 2018-09-23: 80 ug via INTRAVENOUS

## 2018-09-23 MED ORDER — ACETAMINOPHEN 10 MG/ML IV SOLN
1000.0000 mg | Freq: Once | INTRAVENOUS | Status: DC | PRN
  Filled 2018-09-23: qty 100

## 2018-09-23 MED ORDER — DEXAMETHASONE SODIUM PHOSPHATE 10 MG/ML IJ SOLN
INTRAMUSCULAR | Status: AC
  Filled 2018-09-23: qty 1

## 2018-09-23 MED ORDER — ROCURONIUM BROMIDE 100 MG/10ML IV SOLN
INTRAVENOUS | Status: DC | PRN
  Administered 2018-09-23: 30 mg via INTRAVENOUS

## 2018-09-23 MED ORDER — FENTANYL CITRATE (PF) 100 MCG/2ML IJ SOLN
INTRAMUSCULAR | Status: DC | PRN
  Administered 2018-09-23 (×2): 50 ug via INTRAVENOUS

## 2018-09-23 MED ORDER — SODIUM CHLORIDE (PF) 0.9 % IJ SOLN
INTRAMUSCULAR | Status: DC | PRN
  Administered 2018-09-23: 50 mL

## 2018-09-23 MED ORDER — PROPOFOL 10 MG/ML IV BOLUS
INTRAVENOUS | Status: AC
  Filled 2018-09-23: qty 40

## 2018-09-23 MED ORDER — PROPOFOL 10 MG/ML IV BOLUS
INTRAVENOUS | Status: DC | PRN
  Administered 2018-09-23: 100 mg via INTRAVENOUS
  Administered 2018-09-23: 20 mg via INTRAVENOUS

## 2018-09-23 MED ORDER — LIDOCAINE 2% (20 MG/ML) 5 ML SYRINGE
INTRAMUSCULAR | Status: DC | PRN
  Administered 2018-09-23: 50 mg via INTRAVENOUS

## 2018-09-23 MED ORDER — DEXAMETHASONE SODIUM PHOSPHATE 4 MG/ML IJ SOLN
INTRAMUSCULAR | Status: DC | PRN
  Administered 2018-09-23: 10 mg via INTRAVENOUS

## 2018-09-23 MED ORDER — ROCURONIUM BROMIDE 100 MG/10ML IV SOLN
INTRAVENOUS | Status: AC
  Filled 2018-09-23: qty 1

## 2018-09-23 MED ORDER — ARTIFICIAL TEARS OPHTHALMIC OINT
TOPICAL_OINTMENT | OPHTHALMIC | Status: AC
  Filled 2018-09-23: qty 3.5

## 2018-09-23 MED ORDER — PROMETHAZINE HCL 25 MG/ML IJ SOLN
6.2500 mg | INTRAMUSCULAR | Status: DC | PRN
  Filled 2018-09-23: qty 1

## 2018-09-23 MED ORDER — FLEET ENEMA 7-19 GM/118ML RE ENEM
1.0000 | ENEMA | Freq: Once | RECTAL | Status: AC
  Administered 2018-09-23: 1 via RECTAL
  Filled 2018-09-23: qty 1

## 2018-09-23 MED ORDER — PHENYLEPHRINE 40 MCG/ML (10ML) SYRINGE FOR IV PUSH (FOR BLOOD PRESSURE SUPPORT)
PREFILLED_SYRINGE | INTRAVENOUS | Status: AC
  Filled 2018-09-23: qty 10

## 2018-09-23 MED ORDER — LACTATED RINGERS IV SOLN
INTRAVENOUS | Status: DC
  Administered 2018-09-23 (×2): via INTRAVENOUS
  Filled 2018-09-23: qty 1000

## 2018-09-23 MED ORDER — MIDAZOLAM HCL 2 MG/2ML IJ SOLN
INTRAMUSCULAR | Status: AC
  Filled 2018-09-23: qty 2

## 2018-09-23 MED FILL — HYDROCODON-APAP 10-325: 10-325 | 1 days supply | Qty: 8 | Fill #0

## 2018-09-23 MED FILL — TAMSULOSIN HCL 0.4 MG CAP: 0.4 | 7 days supply | Qty: 7 | Fill #0

## 2018-09-23 SURGICAL SUPPLY — 41 items
BAG URINE DRAINAGE (UROLOGICAL SUPPLIES) ×2 IMPLANT
BLADE CLIPPER SURG (BLADE) ×2 IMPLANT
CATH FOLEY 2WAY SLVR  5CC 16FR (CATHETERS) ×1
CATH FOLEY 2WAY SLVR 5CC 16FR (CATHETERS) ×1 IMPLANT
CATH ROBINSON RED A/P 20FR (CATHETERS) ×2 IMPLANT
CLOTH BEACON ORANGE TIMEOUT ST (SAFETY) ×2 IMPLANT
CONT SPECI 4OZ STER CLIK (MISCELLANEOUS) ×4 IMPLANT
COVER BACK TABLE 60X90IN (DRAPES) ×2 IMPLANT
COVER MAYO STAND STRL (DRAPES) ×2 IMPLANT
DRSG TEGADERM 4X4.75 (GAUZE/BANDAGES/DRESSINGS) ×2 IMPLANT
DRSG TEGADERM 8X12 (GAUZE/BANDAGES/DRESSINGS) ×2 IMPLANT
GAUZE SPONGE 4X4 12PLY STRL LF (GAUZE/BANDAGES/DRESSINGS) ×1 IMPLANT
GLOVE BIO SURGEON STRL SZ 6 (GLOVE) IMPLANT
GLOVE BIO SURGEON STRL SZ 6.5 (GLOVE) ×2 IMPLANT
GLOVE BIO SURGEON STRL SZ7 (GLOVE) IMPLANT
GLOVE BIO SURGEON STRL SZ8 (GLOVE) ×2 IMPLANT
GLOVE BIOGEL PI IND STRL 6 (GLOVE) IMPLANT
GLOVE BIOGEL PI IND STRL 6.5 (GLOVE) IMPLANT
GLOVE BIOGEL PI IND STRL 7.0 (GLOVE) IMPLANT
GLOVE BIOGEL PI IND STRL 8 (GLOVE) IMPLANT
GLOVE BIOGEL PI INDICATOR 6 (GLOVE)
GLOVE BIOGEL PI INDICATOR 6.5 (GLOVE)
GLOVE BIOGEL PI INDICATOR 7.0 (GLOVE) ×1
GLOVE BIOGEL PI INDICATOR 8 (GLOVE)
GLOVE ECLIPSE 8.0 STRL XLNG CF (GLOVE) ×3 IMPLANT
GOWN STRL REUS W/TWL LRG LVL3 (GOWN DISPOSABLE) ×3 IMPLANT
GOWN STRL REUS W/TWL XL LVL3 (GOWN DISPOSABLE) ×2 IMPLANT
HOLDER FOLEY CATH W/STRAP (MISCELLANEOUS) IMPLANT
IMPL SPACEOAR SYSTEM 10ML (Spacer) ×1 IMPLANT
IMPLANT SPACEOAR SYSTEM 10ML (Spacer) ×2 IMPLANT
IV NS 1000ML (IV SOLUTION) ×2
IV NS 1000ML BAXH (IV SOLUTION) ×1 IMPLANT
KIT TURNOVER CYSTO (KITS) ×2 IMPLANT
MARKER SKIN DUAL TIP RULER LAB (MISCELLANEOUS) ×2 IMPLANT
PACK CYSTO (CUSTOM PROCEDURE TRAY) ×2 IMPLANT
SURGILUBE 2OZ TUBE FLIPTOP (MISCELLANEOUS) IMPLANT
SYR 10ML LL (SYRINGE) IMPLANT
TOWEL OR 17X24 6PK STRL BLUE (TOWEL DISPOSABLE) ×4 IMPLANT
UNDERPAD 30X30 (UNDERPADS AND DIAPERS) ×4 IMPLANT
WATER STERILE IRR 500ML POUR (IV SOLUTION) ×2 IMPLANT
radioactive seed ×77 IMPLANT

## 2018-09-23 NOTE — Transfer of Care (Signed)
   Last Vitals:  Vitals Value Taken Time  BP 161/69 09/23/2018 11:28 AM  Temp    Pulse 68 09/23/2018 11:30 AM  Resp 12 09/23/2018 11:30 AM  SpO2 100 % 09/23/2018 11:30 AM  Vitals shown include unvalidated device data.  Last Pain:  Vitals:   09/23/18 0753  TempSrc:   PainSc: 0-No pain      Patients Stated Pain Goal: 8 (09/23/18 0753) Immediate Anesthesia Transfer of Care Note  Patient: Collin Young  Procedure(s) Performed: Procedure(s) (LRB): RADIOACTIVE SEED IMPLANT/BRACHYTHERAPY IMPLANT (N/A)  Patient Location: PACU  Anesthesia Type: General  Level of Consciousness: awake, alert  and oriented  Airway & Oxygen Therapy: Patient Spontanous Breathing and Patient connected to nasal cannula oxygen  Post-op Assessment: Report given to PACU RN and Post -op Vital signs reviewed and stable  Post vital signs: Reviewed and stable  Complications: No apparent anesthesia complications

## 2018-09-23 NOTE — Discharge Instructions (Signed)
DISCHARGE INSTRUCTIONS FOR PROSTATE SEED IMPLANTATION   Antibiotics You may be given a prescription for an antibiotic to take when you arrive home. If so, be sure to take every tablet in the bottle, even if you are feeling better before the prescription is finished. If you begin itching, notice a rash or start to swell on your trunk, arms, legs and/or throat, immediately stop taking the antibiotic and call your Urologist. Diet Resume your usual diet when you return home. To keep your bowels moving easily and softly, drink prune, apple and cranberry juice at room temperature. You may also take a stool softener, such as Colace, which is available without prescription at local pharmacies. Daily activities No driving or heavy lifting for at least two days after the implant. No bike riding, horseback riding or riding lawn mowers for the first month after the implant. Any strenuous physical activity should be approved by your doctor before you resume it. Sexual relations You may resume sexual relations two weeks after the procedure. A condom should be used for the first two weeks. Your semen may be dark brown or black; this is normal and is related bleeding that may have occurred during the implant. Postoperative swelling Expect swelling and bruising of the scrotum and perineum (the area between the scrotum and anus). Both the swelling and the bruising should resolve in l or 2 weeks. Ice packs and over- the-counter medications such as Tylenol, Advil or Aleve may lessen your discomfort. Postoperative urination Most men experience burning on urination and/or urinary frequency. If this becomes bothersome, contact your Urologist.  Medication can be prescribed to relieve these problems.  It is normal to have some blood in your urine for a few days after the implant. Special instructions related to the seeds It is unlikely that you will pass an Iodine-125 seed in your urine. The seeds are silver in color and  are about as large as a grain of rice. If you pass a seed, do not handle it with your fingers. Use a spoon to place it in an envelope or jar in place this in base occluded area such as the garage or basement for return to the radiation clinic at your convenience.  Contact your doctor for Temperature greater than 101 F Increasing pain Inability to urinate Follow-up  You should have follow up with your urologist and radiation oncologist about 3 weeks after the procedure. General information regarding Iodine seeds Iodine-125 is a low energy radioactive material. It is not deeply penetrating and loses energy at short distances. Your prostate will absorb the radiation. Objects that are touched or used by the patient do not become radioactive. Body wastes (urine and stool) or body fluids (saliva, tears, semen or blood) are not radioactive. The Nuclear Regulatory Commission (NRC) has determined that no radiation precautions are needed for patients undergoing Iodine-125 seed implantation. The NRC states that such patients do not present a risk to the people around them, including young children and pregnant women. However, in keeping with the general principle that radiation exposure should be kept as low reasonably possible, we suggest the following: Children and pets should not sit on the patient's lap for the first two (2) weeks after the implant. Pregnant (or possibly pregnant) women should avoid prolonged, close contact with the patient for the first two (2) weeks after the implant. A distance of three (3) feet is acceptable. At a distance of three (3) feet, there is no limit to the length of time anyone can   be with the patient.  Post Anesthesia Home Care Instructions  Activity: Get plenty of rest for the remainder of the day. A responsible adult should stay with you for 24 hours following the procedure.  For the next 24 hours, DO NOT: -Drive a car -Operate machinery -Drink alcoholic  beverages -Take any medication unless instructed by your physician -Make any legal decisions or sign important papers.  Meals: Start with liquid foods such as gelatin or soup. Progress to regular foods as tolerated. Avoid greasy, spicy, heavy foods. If nausea and/or vomiting occur, drink only clear liquids until the nausea and/or vomiting subsides. Call your physician if vomiting continues.  Special Instructions/Symptoms: Your throat may feel dry or sore from the anesthesia or the breathing tube placed in your throat during surgery. If this causes discomfort, gargle with warm salt water. The discomfort should disappear within 24 hours.  If you had a scopolamine patch placed behind your ear for the management of post- operative nausea and/or vomiting:  1. The medication in the patch is effective for 72 hours, after which it should be removed.  Wrap patch in a tissue and discard in the trash. Wash hands thoroughly with soap and water. 2. You may remove the patch earlier than 72 hours if you experience unpleasant side effects which may include dry mouth, dizziness or visual disturbances. 3. Avoid touching the patch. Wash your hands with soap and water after contact with the patch.   

## 2018-09-23 NOTE — Anesthesia Preprocedure Evaluation (Addendum)
Anesthesia Evaluation  Patient identified by MRN, date of birth, ID band Patient awake    Reviewed: Allergy & Precautions, NPO status , Patient's Chart, lab work & pertinent test results  Airway Mallampati: II  TM Distance: >3 FB Neck ROM: Full    Dental  (+) Edentulous Upper   Pulmonary neg pulmonary ROS, former smoker,    Pulmonary exam normal breath sounds clear to auscultation       Cardiovascular hypertension, + CAD and +CHF  + Valvular Problems/Murmurs AI  Rhythm:Regular Rate:Normal + Systolic murmurs    Neuro/Psych negative neurological ROS  negative psych ROS   GI/Hepatic Neg liver ROS, GERD  ,GERD (gastroesophageal reflux disease)  09-07-2018   no issues since Gtube removed 06/ 2019    Endo/Other  negative endocrine ROS  Renal/GU negative Renal ROS  negative genitourinary   Musculoskeletal negative musculoskeletal ROS (+)   Abdominal   Peds negative pediatric ROS (+)  Hematology negative hematology ROS (+)   Anesthesia Other Findings   Reproductive/Obstetrics negative OB ROS                            Anesthesia Physical Anesthesia Plan  ASA: III  Anesthesia Plan: General   Post-op Pain Management:    Induction: Intravenous  PONV Risk Score and Plan: 2 and Ondansetron, Dexamethasone and Treatment may vary due to age or medical condition  Airway Management Planned: LMA  Additional Equipment:   Intra-op Plan:   Post-operative Plan: Extubation in OR  Informed Consent: I have reviewed the patients History and Physical, chart, labs and discussed the procedure including the risks, benefits and alternatives for the proposed anesthesia with the patient or authorized representative who has indicated his/her understanding and acceptance.     Dental advisory given  Plan Discussed with: CRNA and Surgeon  Anesthesia Plan Comments:         Anesthesia Quick  Evaluation

## 2018-09-23 NOTE — H&P (Signed)
HPI: Collin Young is a 68 year-old male with biopsy-proven adenocarcinoma the prostate.  His prostate cancer was diagnosed 05/27/2018. His PSA at his time of diagnosis was 9.3. His cancer was T1c, Gleason 3+4 and 4+3.Marland Kitchen   PSA 6/19 - 9.3, DRE - nl.  TRUS/BX 05/27/18: Prostate volume - 25 cc  Pathology: Adenocarcinoma 4+3=7 in 2 cores and 3+4=7 in 2 cores  Stage: T1c   06/02/18: He came in with his wife today and has had no significant complications or difficulties following his prostate biopsy.     ALLERGIES:     MEDICATIONS: Cialis 20 mg tablet 0 Oral As needed  Levaquin 750 mg tablet Take the morning of your prostate biopsy.  Lisinopril-Hydrochlorothiazide 10 mg-12.5 mg tablet Oral  Zantac 150 mg capsule Oral     GU PSH: Prostate Needle Biopsy - 05/27/2018    NON-GU PSH: Surgical Pathology, Gross And Microscopic Examination For Prostate Needle - 05/27/2018    GU PMH: BPH w/o LUTS, He has a fairly large prostate by exam but does not have any significant voiding symptoms at this time. - 05/10/2018 BPH w/LUTS, Benign prostatic hyperplasia with urinary obstruction - 2014 ED due to arterial insufficiency, Erectile dysfunction due to arterial insufficiency - 2014 Elevated PSA      PMH Notes: .   NON-GU PMH: Personal history of other diseases of the circulatory system, History of hypertension - 2014    FAMILY HISTORY: 2 daughters - Runs in [place] Status - Mother's Age - Father Family Health Status Number - Father Father Deceased At Age49 ___ - Father Heart Disease - Runs In Family   SOCIAL HISTORY: Marital Status: Single Preferred Language: English; Ethnicity: Not Hispanic Or Latino; Race: Black or African American Current Smoking Status: Patient does not smoke anymore. Has not smoked since 05/01/1998.   Tobacco Use Assessment Completed: Used Tobacco in last 30 days? Does not drink anymore.  Does not drink caffeine.     Notes: Alcohol Use, Tobacco Use, Caffeine  Use, Marital History - Currently Married   REVIEW OF SYSTEMS:    GU Review Male:   Patient denies frequent urination, hard to postpone urination, burning/ pain with urination, get up at night to urinate, leakage of urine, stream starts and stops, trouble starting your stream, have to strain to urinate , erection problems, and penile pain.  Gastrointestinal (Upper):   Patient denies nausea, vomiting, and indigestion/ heartburn.  Gastrointestinal (Lower):   Patient denies diarrhea and constipation.  Constitutional:   Patient denies fever, night sweats, weight loss, and fatigue.  Skin:   Patient denies skin rash/ lesion and itching.  Eyes:   Patient denies blurred vision and double vision.  Ears/ Nose/ Throat:   Patient denies sore throat and sinus problems.  Hematologic/Lymphatic:   Patient denies swollen glands and easy bruising.  Cardiovascular:   Patient denies leg swelling and chest pains.  Respiratory:   Patient denies cough and shortness of breath.  Endocrine:   Patient denies excessive thirst.  Musculoskeletal:   Patient denies back pain and joint pain.  Neurological:   Patient denies headaches and dizziness.  Psychologic:   Patient denies depression and anxiety.   VITAL SIGNS:    Weight 126 lb / 57.15 kg  Height 66 in / 167.64 cm  BP 165/70 mmHg  Pulse 90 /min  BMI 20.3 kg/m   GU PHYSICAL EXAMINATION:    Anus and Perineum: No hemorrhoids. No anal stenosis. No rectal fissure, no anal fissure. No edema, no  dimple, no perineal tenderness, no anal tenderness.  Scrotum: No lesions. No edema. No cysts. No warts.  Epididymides: Right: no spermatocele, no masses, no cysts, no tenderness, no induration, no enlargement. Left: no spermatocele, no masses, no cysts, no tenderness, no induration, no enlargement.  Testes: No tenderness, no swelling, no enlargement left testes. No tenderness, no swelling, no enlargement right testes. Normal location left testes. Normal location right testes. No  mass, no cyst, no varicocele, no hydrocele left testes. No mass, no cyst, no varicocele, no hydrocele right testes.  Urethral Meatus: Normal size. No lesion, no wart, no discharge, no polyp. Normal location.  Penis: Penis uncircumcised. No foreskin warts, no cracks. No dorsal peyronie's plaques, no left corporal peyronie's plaques, no right corporal peyronie's plaques, no scarring, no shaft warts. No balanitis, no meatal stenosis.   Prostate: Prostate 3 + size. Left lobe normal consistency, right lobe normal consistency. Symmetrical lobes. No prostate nodule. Left lobe no tenderness, right lobe no tenderness.   Seminal Vesicles: Nonpalpable.  Sphincter Tone: Normal sphincter. No rectal tenderness. No rectal mass.    MULTI-SYSTEM PHYSICAL EXAMINATION:    Constitutional: Well-nourished. No physical deformities. Normally developed. Good grooming.  Neck: Neck symmetrical, not swollen. Normal tracheal position.  Respiratory: No labored breathing, no use of accessory muscles.   Cardiovascular: Normal temperature, normal extremity pulses, no swelling, no varicosities.  Lymphatic: No enlargement of neck, axillae, groin.  Skin: No paleness, no jaundice, no cyanosis. No lesion, no ulcer, no rash.  Neurologic / Psychiatric: Oriented to time, oriented to place, oriented to person. No depression, no anxiety, no agitation.  Gastrointestinal: No mass, no tenderness, no rigidity, non obese abdomen.  Eyes: Normal conjunctivae. Normal eyelids.  Ears, Nose, Mouth, and Throat: Left ear no scars, no lesions, no masses. Right ear no scars, no lesions, no masses. Nose no scars, no lesions, no masses. Normal hearing. Normal lips.  Musculoskeletal: Normal gait and station of head and neck.    PAST DATA REVIEWED:  Source Of History:  Patient  Lab Test Review:   Path Report   02/01/18 01/03/18 05/14/12  PSA  Total PSA 9.3 ng/dl 9.0 ng/dl 3.48 ng/dl    PROCEDURES:          Urinalysis w/Scope Dipstick Dipstick  Cont'd Micro  Color: Yellow Bilirubin: Neg mg/dL WBC/hpf: 6 - 10/hpf  Appearance: Clear Ketones: Neg mg/dL RBC/hpf: 40 - 60/hpf  Specific Gravity: 1.020 Blood: 3+ ery/uL Bacteria: NS (Not Seen)  pH: <=5.0 Protein: Trace mg/dL Cystals: NS (Not Seen)  Glucose: Neg mg/dL Urobilinogen: 0.2 mg/dL Casts: NS (Not Seen)    Nitrites: Neg Trichomonas: Not Present    Leukocyte Esterase: Neg leu/uL Mucous: Present      Epithelial Cells: 0 - 5/hpf      Yeast: NS (Not Seen)      Sperm: Not Present    ASSESSMENT/PLAN:     ICD-10 Details  1 GU:   Prostate Cancer -  I went over his pathology report with him today as well as the significance of his Gleason score, number and location of cores positive and percent of cores positive. We then discussed his Partin table results in detail and the significance of these predictions as far as prognosis and need for further workup are concerned. I then discussed with him the various options available including active surveillance and treatment for cure such as radiation and surgery. We briefly discussed the forms of radiation available. I also gave him written information outlining the disease, its workup  and the options for treatment for him to review further.   He has had previous radiation for esophageal CA 5 years ago. After discussing each of the options for treatment he has elected to proceed with radiation and I believe would be a good candidate.  He presents today for I-125 radioactive seed implant.

## 2018-09-23 NOTE — Anesthesia Procedure Notes (Signed)
Procedure Name: Intubation Date/Time: 09/23/2018 9:52 AM Performed by: Myrtie Soman, MD Pre-anesthesia Checklist: Patient identified, Emergency Drugs available, Suction available and Patient being monitored Patient Re-evaluated:Patient Re-evaluated prior to induction Oxygen Delivery Method: Circle system utilized Preoxygenation: Pre-oxygenation with 100% oxygen Induction Type: IV induction Ventilation: Mask ventilation without difficulty Grade View: Grade IV Tube type: Oral Tube size: 6.0 mm Number of attempts: 1 Airway Equipment and Method: Stylet and Bougie stylet Placement Confirmation: positive ETCO2,  breath sounds checked- equal and bilateral and CO2 detector Secured at: 20 cm Tube secured with: Tape Dental Injury: Teeth and Oropharynx as per pre-operative assessment and Bloody posterior oropharynx  Difficulty Due To: Difficult Airway- due to anterior larynx and Difficult Airway- due to immobile epiglottis Future Recommendations: Recommend- induction with short-acting agent, and alternative techniques readily available Comments: Attempted to place number 4 LMA.  Unable to seat.  Repositioned LMA x 2 without improvement. Able to hand ventilate easily with FM.  IV anectine give.  DL x 1 by R. Rockland Kotarski, CRNA, unable to visualize glottic opening using MAC 4.  DL x 2 by Dr. Rodell Perna, unable to intubate with 7.5 OETT.  Bougie passed into glottic opening.  7.5 OETT unable to pass between vocal cords. Additional IV anectine given.  6.0 cuffed OETT placed over bougie stylet.  +ETCO2, +EBBS, VSS. OETT secured at 20 cm. Patient has history of esophageal and piriform sinus CA treated with radiation therapy.

## 2018-09-23 NOTE — Op Note (Signed)
PATIENT:  Collin Young  PRE-OPERATIVE DIAGNOSIS:  Adenocarcinoma of the prostate  POST-OPERATIVE DIAGNOSIS:  Same  PROCEDURE:  1. I-125 radioactive seed implantation 2. Cystoscopy  3. Placement of SpaceOAR  SURGEON:  Surgeon(s): Claybon Jabs  Radiation oncologist: Dr. Tyler Pita  ANESTHESIA:  General  EBL:  Minimal  DRAINS: None  INDICATION: Collin Young is a 68 year old male with adenocarcinoma of the prostate Gleason 4+3 = 7 in 2 cores and 3+4 = 7 in 2 cores with no abnormality noted on DRE making him a stage T1c.  He has elected to proceed with radioactive seed implant and placement of  SpaceOAR  Description of procedure: After informed consent the patient was brought to the major OR, placed on the table and administered general anesthesia. He was then moved to the modified lithotomy position with his perineum perpendicular to the floor. His perineum and genitalia were then sterilely prepped. An official timeout was then performed. A 16 French Foley catheter was then placed in the bladder and filled with dilute contrast, a rectal tube was placed in the rectum and the transrectal ultrasound probe was placed in the rectum and affixed to the stand. He was then sterilely draped.  Real time ultrasonography was used along with the seed planning software [employer] Prostate vs. [id number]. This was used to develop the seed plan including the number of needles as well as number of seeds required for complete and adequate coverage.  The needles were then preloaded with seeds and spacers according to the previously developed plan.  Real-time ultrasonography was then used along with the previously developed plan to implant a total of 77 seeds using 27 needles. This proceeded without difficulty or complication.   I then proceeded with placement of SpaceOAR by introducing a needle with the bevel angled inferiorly approximately 2 cm superior to the anus. This was angled downward and under  direct ultrasound was placed within the space between the prostatic capsule and rectum. This was confirmed with a small amount of sterile saline injected and this was performed under direct ultrasound. I then attached the SpaceOAR to the needle and injected this in the space between the prostate and rectum with good placement noted.  A Foley catheter was then removed as well as the transrectal ultrasound probe and rectal probe. Flexible cystoscopy was then performed using the 17 French flexible scope which revealed a normal urethra throughout its length down to the sphincter which appeared intact. The prostatic urethra revealed bilobar hypertrophy but no evidence of obstruction, seeds, spacers or lesions. The bladder was then entered and fully and systematically inspected. The ureteral orifices were noted to be of normal configuration and position. The mucosa revealed no evidence of tumors. There were also no stones identified within the bladder. I noted no seeds or spacers on the floor of the bladder and retroflexion of the scope revealed no seeds protruding from the base of the prostate.  The cystoscope was then removed and the patient was awakened and taken to recovery room in stable and satisfactory condition. He tolerated procedure well and there were no intraoperative complications.

## 2018-09-25 NOTE — Progress Notes (Signed)
  Radiation Oncology         [phone]) [phone] ________________________________  Name: Collin Young MRN: [id number]  Date: 09/25/2018  DOB: 1951-04-18       Prostate Seed Implant  FI:[id number], Jori Moll, MD  No ref. provider found  DIAGNOSIS: 68 y.o. gentleman with Stage T1c adenocarcinoma of the prostate with Gleason score of 4+3, and PSA of 9.3     ICD-10-CM   1. Malignant neoplasm of prostate [facility]) [place] Discharge patient    PROCEDURE: Insertion of radioactive I-125 seeds into the prostate gland.  RADIATION DOSE: 145 Gy, definitive therapy.  TECHNIQUE: Collin Young was brought to the operating room with the urologist. He was placed in the dorsolithotomy position. He was catheterized and a rectal tube was inserted. The perineum was shaved, prepped and draped. The ultrasound probe was then introduced into the rectum to see the prostate gland.  TREATMENT DEVICE: A needle grid was attached to the ultrasound probe stand and anchor needles were placed.  3D PLANNING: The prostate was imaged in 3D using a sagittal sweep of the prostate probe. These images were transferred to the planning computer. There, the prostate, urethra and rectum were defined on each axial reconstructed image. Then, the software created an optimized 3D plan and a few seed positions were adjusted. The quality of the plan was reviewed using [facility] information for the target and the following two organs at risk:  Urethra and Rectum.  Then the accepted plan was printed and handed off to the radiation therapist.  Under my supervision, the custom loading of the seeds and spacers was carried out and loaded into sealed vicryl sleeves.  These pre-loaded needles were then placed into the needle holder.Marland Kitchen  PROSTATE VOLUME STUDY:  Using transrectal ultrasound the volume of the prostate was verified to be 37.8 cc.  SPECIAL TREATMENT PROCEDURE/SUPERVISION AND HANDLING: The pre-loaded needles were then delivered under sagittal guidance. A  total of 27 needles were used to deposit 77 seeds in the prostate gland. The individual seed activity was 0.379 mCi.  SpaceOAR:  Yes  COMPLEX SIMULATION: At the end of the procedure, an anterior radiograph of the pelvis was obtained to document seed positioning and count. Cystoscopy was performed to check the urethra and bladder.  MICRODOSIMETRY: At the end of the procedure, the patient was emitting 0.13 mR/hr at 1 meter. Accordingly, he was considered safe for hospital discharge.  PLAN: The patient will return to the radiation oncology clinic for post implant CT dosimetry in three weeks.   ________________________________  Sheral Apley Tammi Klippel, M.D.

## 2018-09-26 ENCOUNTER — Encounter (HOSPITAL_BASED_OUTPATIENT_CLINIC_OR_DEPARTMENT_OTHER): Payer: Self-pay | Admitting: Urology

## 2018-09-26 ENCOUNTER — Ambulatory Visit (HOSPITAL_COMMUNITY)
Admission: RE | Admit: 2018-09-26 | Discharge: 2018-09-26 | Disposition: A | Discharge status: Home / Self Care | Payer: Medicare Other | Source: Ambulatory Visit | Attending: Family Medicine | Admitting: Family Medicine

## 2018-09-26 ENCOUNTER — Ambulatory Visit (HOSPITAL_BASED_OUTPATIENT_CLINIC_OR_DEPARTMENT_OTHER): Payer: Medicare Other | Admitting: Family Medicine

## 2018-09-26 VITALS — BP 129/75 | HR 71 | Temp 97.5°F | Resp 18 | Ht 66.0 in | Wt 138.0 lb

## 2018-09-26 DIAGNOSIS — R05 Cough: Secondary | ICD-10-CM

## 2018-09-26 DIAGNOSIS — Z79899 Other long term (current) drug therapy: Secondary | ICD-10-CM

## 2018-09-26 DIAGNOSIS — I25708 Atherosclerosis of coronary artery bypass graft(s), unspecified, with other forms of angina pectoris: Secondary | ICD-10-CM

## 2018-09-26 DIAGNOSIS — R918 Other nonspecific abnormal finding of lung field: Secondary | ICD-10-CM | POA: Diagnosis present

## 2018-09-26 DIAGNOSIS — C61 Malignant neoplasm of prostate: Secondary | ICD-10-CM

## 2018-09-26 DIAGNOSIS — I5042 Chronic combined systolic (congestive) and diastolic (congestive) heart failure: Secondary | ICD-10-CM | POA: Diagnosis not present

## 2018-09-26 DIAGNOSIS — I5040 Unspecified combined systolic (congestive) and diastolic (congestive) heart failure: Secondary | ICD-10-CM | POA: Insufficient documentation

## 2018-09-26 DIAGNOSIS — R058 Other specified cough: Secondary | ICD-10-CM

## 2018-09-26 DIAGNOSIS — Z8701 Personal history of pneumonia (recurrent): Secondary | ICD-10-CM

## 2018-09-26 DIAGNOSIS — Z8501 Personal history of malignant neoplasm of esophagus: Secondary | ICD-10-CM

## 2018-09-26 DIAGNOSIS — N183 Chronic kidney disease, stage 3 unspecified: Secondary | ICD-10-CM

## 2018-09-26 DIAGNOSIS — D72819 Decreased white blood cell count, unspecified: Secondary | ICD-10-CM

## 2018-09-26 DIAGNOSIS — I255 Ischemic cardiomyopathy: Secondary | ICD-10-CM | POA: Diagnosis not present

## 2018-09-26 DIAGNOSIS — R739 Hyperglycemia, unspecified: Secondary | ICD-10-CM

## 2018-09-26 DIAGNOSIS — Z7982 Long term (current) use of aspirin: Secondary | ICD-10-CM

## 2018-09-26 MED ORDER — DOXYCYCLINE HYCLATE 100 MG PO TABS: 100.0000 mg | ORAL_TABLET | Freq: Two times a day (BID) | ORAL | 0 refills | Status: DC

## 2018-09-26 MED ORDER — OMEPRAZOLE 20 MG PO CPDR: 20.0000 mg | DELAYED_RELEASE_CAPSULE | Freq: Every day | ORAL | 11 refills | Status: DC

## 2018-09-26 NOTE — Progress Notes (Signed)
Subjective:    Patient ID: Collin Young, male    DOB: [birth year], 68 y.o.   MRN: [id number]  HPI       68 yo male new to the practice.  Per chart note, patient has history of prostate cancer and on 09/23/2018 patient had implantation of radioactive seeds for treatment per Dr. Karsten Ro.  Patient also has past medical history significant for esophageal cancer status post treatment with radiation.  Patient has a letter with him from his daughter Collin Young [phone].  Per the letter, patient also with history of congestive heart failure with hospitalization in May 2018.  Patient was diagnosed in 2014 with esophageal cancer and had G-tube placement which was removed in 2019.  Patient also had to undergo speech therapy to help with swallowing status post radiation treatment for esophageal cancer.  Patient also has hearing loss and wears hearing aids but may still have some difficulty hearing at today's visit.       Patient at today's visit with complaint of a recurrent cough for several weeks.  Cough is sometimes productive of white to yellow sputum.  Patient denies any current fever or chills.  Patient reports that he does not have any significant difficulty with swallowing status post treatments are esophageal cancer.  Patient continues to follow-up with his cancer doctor.  Patient also has a cardiologist regarding his ischemic cardiomyopathy/heart failure and a urologist for the prostate cancer and urinary issues.  Patient reports no recent increase in shortness of breath other than with the coughing.  Patient has had no increased peripheral edema.  Patient denies any chest pain or palpitations.  Patient also reports a history of CAD status post CABG.  Patient denies any unusual bruising or bleeding.  Patient has had prior low white blood cell count and this is also followed by his oncologist/hematologist . Past Medical History:  Diagnosis Date  . Chronic combined systolic and diastolic CHF  (congestive heart failure) ([place])    followed by [name]  . COPD (chronic obstructive pulmonary disease) ([place])   . Coronary artery disease cardiologist-- [name]   per cardiac cath 01-05-2017  chronic total occlusion pLCx with collaterals, 99% severe calcified prox. to mid RCA, otherwise mild to moderate CAD (medically managed)  . Esophageal cancer, stage IIIB [facility]) oncologist-  dr ennever/  dr moody   dx 2010  SCC Stage IIIB completed chemoradiation;  localized recurrent left piriform sinus 01/ 2015,  completed concurrent chemoradiatoin 04/ 2015  . GERD (gastroesophageal reflux disease)    09-07-2018   no issues since Gtube removed 06/ 2019  . Headache   . History of alcohol abuse    quit 2001  . History of cancer chemotherapy    2010;   2015  . History of radiation therapy    10-19-2013 to  12-05-2013 pyriform sinus 69.96 Gy/12fx;   Radiation completed 2010 for esophageal cancer  . History of seizure 2001   alcohol withdrawal  . HOH (hard of hearing)   . Hyperlipidemia   . Hypertension   . Ischemic cardiomyopathy 12/2016   01-03-2017  echo,  ef 10-15%/   echo 05-06-2017 EF improved to 45-50%  . Lower urinary tract symptoms (LUTS)   . Prostate cancer [facility]) urologist-  dr ottelin/  oncologist-- [name]   dx 05-27-2018--- Stage T1c,  Gleason 4+3,  PSA 9.3--  scheduled for brachytherapy 09-23-2017  . Renal cyst, left   . Voice hoarseness    secondary  to radiation treatment   Past Surgical History:  Procedure Laterality Date  . CARDIOVASCULAR STRESS TEST  10/09/09   normal nuclearr stress test, EF 57% [name])  . IR GASTROSTOMY TUBE MOD SED  01/13/2017  . IR GASTROSTOMY TUBE REMOVAL  02/03/2018  . IR PATIENT EVAL TECH 0-60 MINS  03/25/2017  . IR REMOVAL TUN ACCESS W/ PORT W/O FL MOD SED  01/15/2017  . IR REPLACE G-TUBE SIMPLE WO FLUORO  01/13/2018  . LARYNGOSCOPY N/A 09/15/2013   Procedure: LARYNGOSCOPY;  Surgeon: [name], MD;  Location: [facility];  Service: ENT;   Laterality: N/A;  direct laryngoscopy with biopsy and esophagoscopy  . RADIOACTIVE SEED IMPLANT N/A 09/23/2018   Procedure: RADIOACTIVE SEED IMPLANT/BRACHYTHERAPY IMPLANT;  Surgeon: [name], MD;  Location: [facility];  Service: Urology;  Laterality: N/A;  . RIGHT/LEFT HEART CATH AND CORONARY ANGIOGRAPHY N/A 01/05/2017   Procedure: Right/Left Heart Cath and Coronary Angiography;  Surgeon: [name], MD;  Location: [facility];  Service: Cardiovascular;  Laterality: N/A;  . TRANSTHORACIC ECHOCARDIOGRAM  05/06/2017   ef 45-50%,  grade 1 diastolic dysfunction/  AV severe calcified non coronary cusp with moderate regurg. , no stenosis (valve area per echo 01-05-2017 1.08cm^2)/  mild MR, TR, and PR/  mild LAE   Social History   Tobacco Use  . Smoking status: Former Smoker    Packs/day: 1.00    Years: 40.00    Pack years: 40.00    Types: Cigarettes    Start date: 11/08/1960    Last attempt to quit: 08/31/2009    Years since quitting: 9.0  . Smokeless tobacco: Former Systems developer    Types: Chew    Quit date: 09/01/1999  Substance Use Topics  . Alcohol use: Not Currently    Alcohol/week: 0.0 standard drinks    Comment: quit in 2001  . Drug use: No    Comment: back in the day used cocaine,alcohol, marijuana   Family History  Problem Relation Age of Onset  . Breast cancer Mother   . Prostate cancer Neg Hx   . Colon cancer Neg Hx   . Pancreatic cancer Neg Hx   No Known Allergies    Review of Systems  Constitutional: Positive for fatigue (Mild). Negative for chills and fever.  HENT: Positive for hearing loss and trouble swallowing (Minor per patient). Negative for congestion, nosebleeds, postnasal drip, rhinorrhea and sore throat.   Respiratory: Positive for cough and shortness of breath (Mild). Negative for chest tightness.   Cardiovascular: Negative for chest pain, palpitations and leg swelling.  Gastrointestinal: Negative for abdominal pain, constipation,  diarrhea and nausea.  Endocrine: Negative for polydipsia, polyphagia and polyuria.  Genitourinary: Positive for frequency. Negative for dysuria and flank pain.  Musculoskeletal: Negative for arthralgias, back pain and gait problem.  Neurological: Negative for dizziness and headaches.  Hematological: Negative for adenopathy. Does not bruise/bleed easily.       Objective:   Physical Exam Vitals signs and nursing note reviewed.  Constitutional:      General: He is not in acute distress.    Appearance: Normal appearance. He is not ill-appearing.     Comments: Patient is hard of hearing; patient with occasional cough while in the exam room  HENT:     Head: Normocephalic and atraumatic.     Nose: Congestion and rhinorrhea (clear) present.  Eyes:     Extraocular Movements: Extraocular movements intact.     Conjunctiva/sclera: Conjunctivae normal.  Neck:  Musculoskeletal: Normal range of motion and neck supple. No muscular tenderness.  Cardiovascular:     Rate and Rhythm: Regular rhythm.  Pulmonary:     Effort: Pulmonary effort is normal. No respiratory distress.     Breath sounds: Rhonchi (Scattered rhonchi in all lung fields, mild) present.  Abdominal:     General: There is no distension.     Tenderness: There is no abdominal tenderness. There is no right CVA tenderness, left CVA tenderness, guarding or rebound.  Musculoskeletal:        General: No tenderness.     Right lower leg: No edema.     Left lower leg: No edema.  Lymphadenopathy:     Cervical: No cervical adenopathy.  Skin:    General: Skin is warm and dry.  Neurological:     General: No focal deficit present.     Mental Status: He is alert and oriented to person, place, and time.  Psychiatric:        Mood and Affect: Mood normal.        Behavior: Behavior normal.    BP 129/75 (BP Location: Left Arm, Patient Position: Sitting, Cuff Size: Normal)   Pulse 71   Temp (!) 97.5 F (36.4 C) (Oral)   Resp 18   Ht 5'  6" (1.676 m)   Wt 138 lb (62.6 kg)   SpO2 96%   BMI 22.27 kg/m         Assessment & Plan:  1. Chronic combined systolic and diastolic heart failure ([place]) Patient with stable chronic heart failure.  Patient's note from his cardiology visit with Dr. Oval Linsey in December was reviewed.  Patient will have BMP in follow-up of CHF and medication use.  Continuing Entresto - Basic Metabolic Panel  2. Ischemic cardiomyopathy Patient will continue medications as per cardiology for stable ischemic cardiomyopathy  3. Coronary artery disease of bypass graft of native heart with stable angina pectoris ([place]) Stable coronary artery disease status post surgical intervention.  Continue secondary prevention with daily atorvastatin, baby aspirin and metoprolol - CBC with Differential  4. Lung field abnormal finding on examination Presence of rhonchi on today's exam and patient with complaint of recent recurrent productive cough.  Patient was asked to obtain chest x-ray for which an order was placed.  Patient will be placed on doxycycline 100 mg twice daily x10 days.  Patient can also take an over-the-counter medication such as Mucinex or Robitussin-DM to help of cough and congestion - DG Chest 2 View; Future - doxycycline (VIBRA-TABS) 100 MG tablet; Take 1 tablet (100 mg total) by mouth 2 (two) times daily.  Dispense: 20 tablet; Refill: 0  5. History of aspiration pneumonia Patient with history of aspiration pneumonia but he denies any current significant dysphagia related to his history of esophageal cancer.  Patient with rhonchi on exam and complaint of ongoing recurrent cough.  Prescription provided for doxycycline and patient was asked to obtain chest x-ray - DG Chest 2 View; Future - doxycycline (VIBRA-TABS) 100 MG tablet; Take 1 tablet (100 mg total) by mouth 2 (two) times daily.  Dispense: 20 tablet; Refill: 0  6. Recurrent productive cough Patient was asked to obtain a chest x-ray and patient is  being placed on doxycycline.  Patient should follow-up in 1 to 2 weeks if his cough is not improved and patient will be notified sooner if there are any abnormalities seen on x-ray - DG Chest 2 View; Future - doxycycline (VIBRA-TABS) 100 MG tablet; Take 1  tablet (100 mg total) by mouth 2 (two) times daily.  Dispense: 20 tablet; Refill: 0  7. Stage 3 chronic kidney disease ([place]) Patient with stage III chronic kidney disease and he will have repeat BMP as well as CBC at today's visit.  BMP will be done to gauge his current renal function and to look for electrolyte abnormalities and CBC to look for anemia related to renal disease but patient with multiple medical problems and likely has some anemia of chronic disease - Basic Metabolic Panel - CBC with Differential  8. Elevated blood sugar On review of chart/prior labs, patient has had elevated blood sugar in the past and patient will have BMP and hemoglobin A1c done at today's visit in follow-up - Basic Metabolic Panel - Hemoglobin A1c  9. Encounter for long-term (current) use of medications Patient will have BMP in follow-up of medication use for treatment of multiple medical issues including CHF, CAD and chronic pain - Basic Metabolic Panel  10. Prostate cancer [facility]) Patient has recently received radioactive seed implant for treatment of prostate cancer and this is being followed by both hematology oncology and urology  11. History of esophageal cancer Prescription provided for omeprazole 20 mg daily to reduce stomach acid due to patient's history of esophageal cancer and patient's daily use of aspirin secondary to history of CAD - omeprazole (PRILOSEC) 20 MG capsule; Take 1 capsule (20 mg total) by mouth daily. To reduce stomach acid, stomach protection  Dispense: 30 capsule; Refill: 11  12. Long-term use of aspirin therapy Patient will have CBC in follow-up of his long-term use of aspirin therapy and patient will be placed on omeprazole  to help with stomach protection - CBC with Differential - omeprazole (PRILOSEC) 20 MG capsule; Take 1 capsule (20 mg total) by mouth daily. To reduce stomach acid, stomach protection  Dispense: 30 capsule; Refill: 11  13. Leukopenia, unspecified type Patient has had leukopenia on prior labs and patient also with history of chronic illness which may also be a contributing factor.  Patient also is African-American and may be predisposed to mild leukopenia of no significant consequence - CBC with Differential  An After Visit Summary was printed and given to the patient.  Allergies as of 09/26/2018   No Known Allergies     Medication List       Accurate as of September 26, 2018 11:59 PM. Always use your most recent med list.        aspirin EC 81 MG tablet Take 81 mg by mouth daily.   atorvastatin 80 MG tablet Commonly known as:  LIPITOR Take 1 tablet (80 mg total) by mouth daily.   carvedilol 3.125 MG tablet Commonly known as:  COREG Take 1 tablet (3.125 mg total) by mouth 2 (two) times daily with a meal.   doxycycline 100 MG tablet Commonly known as:  VIBRA-TABS Take 1 tablet (100 mg total) by mouth 2 (two) times daily.   fentaNYL 12 MCG/HR Commonly known as:  DURAGESIC Place 1 patch (12.5 mcg total) onto the skin every 3 (three) days.   HYDROcodone-acetaminophen 10-325 MG tablet Commonly known as:  NORCO Take 1-2 tablets by mouth every 4 (four) hours as needed for moderate pain. Maximum dose per 24 hours - 8 pills   isosorbide mononitrate 10 MG tablet Commonly known as:  ISMO,MONOKET take 1 tab (10mg ) per tube BID   omeprazole 20 MG capsule Commonly known as:  PRILOSEC Take 1 capsule (20 mg total) by mouth daily. To  reduce stomach acid, stomach protection   sacubitril-valsartan 24-26 MG Commonly known as:  Entresto Take 1 tablet by mouth 2 (two) times daily.   tamsulosin 0.4 MG Caps capsule Commonly known as:  FLOMAX Take 1 capsule (0.4 mg total) by mouth daily  after supper.      Return in about 3 weeks (around 10/17/2018) for cough, lab review.

## 2018-09-26 NOTE — Anesthesia Postprocedure Evaluation (Signed)
Anesthesia Post Note  Patient: Collin Young  Procedure(s) Performed: RADIOACTIVE SEED IMPLANT/BRACHYTHERAPY IMPLANT (N/A Prostate)     Patient location during evaluation: PACU Anesthesia Type: General Level of consciousness: awake and alert Pain management: pain level controlled Vital Signs Assessment: post-procedure vital signs reviewed and stable Respiratory status: spontaneous breathing, nonlabored ventilation, respiratory function stable and patient connected to nasal cannula oxygen Cardiovascular status: blood pressure returned to baseline and stable Postop Assessment: no apparent nausea or vomiting Anesthetic complications: no    Last Vitals:  Vitals:   09/23/18 1215 09/23/18 1345  BP: (!) 144/72 (!) 157/82  Pulse: 71 69  Resp: 17 16  Temp:  36.6 C  SpO2: 96% 100%    Last Pain:  Vitals:   09/23/18 1245  TempSrc:   PainSc: 0-No pain                 Minerva Bluett S

## 2018-09-27 LAB — CBC WITH DIFFERENTIAL/PLATELET
Basophils Absolute: 0 x10E3/uL (ref 0.0–0.2)
Basos: 1 %
EOS (ABSOLUTE): 0.1 x10E3/uL (ref 0.0–0.4)
Eos: 3 %
Hematocrit: 40.5 % (ref 37.5–51.0)
Hemoglobin: 13.3 g/dL (ref 13.0–17.7)
Immature Grans (Abs): 0 x10E3/uL (ref 0.0–0.1)
Immature Granulocytes: 0 %
Lymphocytes Absolute: 1.4 x10E3/uL (ref 0.7–3.1)
Lymphs: 28 %
MCH: 27.8 pg (ref 26.6–33.0)
MCHC: 32.8 g/dL (ref 31.5–35.7)
MCV: 85 fL (ref 79–97)
Monocytes Absolute: 1 x10E3/uL — ABNORMAL HIGH (ref 0.1–0.9)
Monocytes: 19 %
Neutrophils Absolute: 2.6 x10E3/uL (ref 1.4–7.0)
Neutrophils: 49 %
Platelets: 204 x10E3/uL (ref 150–450)
RBC: 4.78 x10E6/uL (ref 4.14–5.80)
RDW: 12.6 % (ref 11.6–15.4)
WBC: 5.1 x10E3/uL (ref 3.4–10.8)

## 2018-09-27 LAB — BASIC METABOLIC PANEL WITH GFR
BUN/Creatinine Ratio: 15 (ref 10–24)
BUN: 16 mg/dL (ref 8–27)
CO2: 31 mmol/L — ABNORMAL HIGH (ref 20–29)
Calcium: 9.5 mg/dL (ref 8.6–10.2)
Chloride: 93 mmol/L — ABNORMAL LOW (ref 96–106)
Creatinine, Ser: 1.08 mg/dL (ref 0.76–1.27)
GFR calc Af Amer: 82 mL/min/1.73
GFR calc non Af Amer: 71 mL/min/1.73
Glucose: 90 mg/dL (ref 65–99)
Potassium: 5.5 mmol/L — ABNORMAL HIGH (ref 3.5–5.2)
Sodium: 137 mmol/L (ref 134–144)

## 2018-09-27 LAB — HEMOGLOBIN A1C
Est. average glucose Bld gHb Est-mCnc: 120 mg/dL
Hgb A1c MFr Bld: 5.8 % — ABNORMAL HIGH (ref 4.8–5.6)

## 2018-09-28 ENCOUNTER — Encounter (HOSPITAL_BASED_OUTPATIENT_CLINIC_OR_DEPARTMENT_OTHER): Payer: Self-pay | Admitting: Urology

## 2018-09-28 ENCOUNTER — Other Ambulatory Visit: Payer: Self-pay | Admitting: Family Medicine

## 2018-09-28 ENCOUNTER — Telehealth: Payer: Self-pay | Admitting: *Deleted

## 2018-09-28 DIAGNOSIS — E875 Hyperkalemia: Secondary | ICD-10-CM

## 2018-09-28 NOTE — Telephone Encounter (Signed)
Patient verified DOB Patients daughter verified DOB and received the results.  Patient is aware of potassium being elevated and to eliminate any rich potassium foods he is eating. Patient is not taking a supplement and only consumes water. Patient is scheduled for 10/05/2018 at 9am to recheck potassium.

## 2018-09-28 NOTE — Telephone Encounter (Signed)
Patient verified DOB Patients daughter is aware of acute concerns being noted on the xray and only prior spinal compressed fractures being noted.  No further questions.

## 2018-09-28 NOTE — Telephone Encounter (Signed)
-----   Message from Antony Blackbird, MD sent at 09/27/2018  5:18 PM EST ----- Normal chest xray but patient apparently has some prior midthoracic spine compression fractures- not sure if he was aware of the prior fractures

## 2018-09-28 NOTE — Telephone Encounter (Signed)
-----   Message from Antony Blackbird, MD sent at 09/28/2018 12:54 AM EST ----- Please notify patient that his recent basic metabolic panel showed an elevation in potassium at 5.5.  Patient should make sure that he is not eating potassium rich foods and if so then stop.  Patient should drink plenty of water.  Patient should not take an over-the-counter potassium supplement.  Patient should return to clinic this week or early next week if possible for recheck of his potassium level.  Patient with normal complete blood count.  Patient's hemoglobin A1c was 5.8.

## 2018-09-28 NOTE — Progress Notes (Signed)
Patient ID: Collin Young, male   DOB: [birth year], 68 y.o.   MRN: [id number]   See lab result note.  Patient with recent BMP and patient's potassium was elevated at 5.5.  Patient will be notified to return for repeat potassium level as well as to stop any over-the-counter potassium supplements as well as stop any potassium rich foods.  Patient will be asked to return to clinic this week or as early as possible next week to repeat potassium level.  Order will be placed for repeat BMP.

## 2018-10-05 ENCOUNTER — Ambulatory Visit: Payer: Medicare Other | Attending: Family Medicine

## 2018-10-05 DIAGNOSIS — E875 Hyperkalemia: Secondary | ICD-10-CM

## 2018-10-06 LAB — BASIC METABOLIC PANEL WITH GFR
BUN/Creatinine Ratio: 14 (ref 10–24)
BUN: 19 mg/dL (ref 8–27)
CO2: 25 mmol/L (ref 20–29)
Calcium: 9.5 mg/dL (ref 8.6–10.2)
Chloride: 99 mmol/L (ref 96–106)
Creatinine, Ser: 1.34 mg/dL — ABNORMAL HIGH (ref 0.76–1.27)
GFR calc Af Amer: 63 mL/min/1.73
GFR calc non Af Amer: 54 mL/min/1.73 — ABNORMAL LOW
Glucose: 119 mg/dL — ABNORMAL HIGH (ref 65–99)
Potassium: 4.7 mmol/L (ref 3.5–5.2)
Sodium: 139 mmol/L (ref 134–144)

## 2018-10-10 ENCOUNTER — Other Ambulatory Visit: Payer: Self-pay | Admitting: *Deleted

## 2018-10-10 DIAGNOSIS — C61 Malignant neoplasm of prostate: Secondary | ICD-10-CM

## 2018-10-10 DIAGNOSIS — C12 Malignant neoplasm of pyriform sinus: Secondary | ICD-10-CM

## 2018-10-10 MED ORDER — FENTANYL 12 MCG/HR TD PT72: 1.0000 | MEDICATED_PATCH | TRANSDERMAL | 0 refills | Status: DC

## 2018-10-11 ENCOUNTER — Telehealth: Payer: Self-pay | Admitting: *Deleted

## 2018-10-11 NOTE — Telephone Encounter (Signed)
Called patient to remind of post seed appts. and MRI for 10-12-18, lvm for a return call

## 2018-10-12 ENCOUNTER — Ambulatory Visit (HOSPITAL_COMMUNITY)
Admission: RE | Admit: 2018-10-12 | Discharge: 2018-10-12 | Disposition: A | Discharge status: Home / Self Care | Payer: Medicare Other | Source: Ambulatory Visit | Attending: Urology | Admitting: Urology

## 2018-10-12 ENCOUNTER — Other Ambulatory Visit: Payer: Self-pay

## 2018-10-12 ENCOUNTER — Ambulatory Visit
Admission: RE | Admit: 2018-10-12 | Discharge: 2018-10-12 | Disposition: A | Discharge status: Home / Self Care | Payer: Medicare Other | Source: Ambulatory Visit | Attending: Urology | Admitting: Urology

## 2018-10-12 ENCOUNTER — Ambulatory Visit
Admission: RE | Admit: 2018-10-12 | Discharge: 2018-10-12 | Disposition: A | Discharge status: Home / Self Care | Payer: Medicare Other | Source: Ambulatory Visit | Attending: Radiation Oncology | Admitting: Radiation Oncology

## 2018-10-12 ENCOUNTER — Encounter: Payer: Self-pay | Admitting: Urology

## 2018-10-12 VITALS — BP 119/59 | HR 75 | Temp 97.6°F | Wt 139.2 lb

## 2018-10-12 DIAGNOSIS — C61 Malignant neoplasm of prostate: Secondary | ICD-10-CM | POA: Diagnosis not present

## 2018-10-12 DIAGNOSIS — Z510 Encounter for antineoplastic radiation therapy: Secondary | ICD-10-CM | POA: Insufficient documentation

## 2018-10-12 NOTE — Progress Notes (Signed)
Radiation Oncology         (336) [phone] ________________________________  Name: Collin Young MRN: [id number]  Date: 10/12/2018  DOB: [birth year]  Post-Seed Follow-Up Visit Note  CC: Antony Blackbird, MD  [name], MD  Diagnosis:   68 y.o. gentleman with Stage T1c adenocarcinoma of the prostate with Gleason score of 4+3, and PSA of 9.3    ICD-10-CM   1. Malignant neoplasm of prostate ([place]) C61     Interval Since Last Radiation:  2.5 weeks 09/23/18:  Insertion of radioactive I-125 seeds into the prostate gland; 145 Gy, definitive therapy with placement of SpaceOAR gel.  Narrative:  The patient returns today for routine follow-up.  He is complaining of increased urinary frequency and urinary hesitation symptoms.  He specifically denies dysuria, gross hematuria, urgency, straining to void or incontinence.  He reports that his nocturia is gradually improving and he is now able to better empty his bladder on voiding.  He filled out a questionnaire regarding urinary function today providing and overall IPSS score of 11 characterizing his symptoms as moderate.  His pre-implant score was 6. He denies any bowel symptoms and reports that he is continued with regular, daily bowel movements.  He denies abdominal pain, nausea, vomiting, diarrhea or constipation.  ALLERGIES:  has No Known Allergies.  Meds: Current Outpatient Medications  Medication Sig Dispense Refill  . aspirin EC 81 MG tablet Take 81 mg by mouth daily.    Marland Kitchen atorvastatin (LIPITOR) 80 MG tablet Take 1 tablet (80 mg total) by mouth daily. (Patient taking differently: Take 80 mg by mouth every morning. ) 30 tablet 5  . carvedilol (COREG) 3.125 MG tablet Take 1 tablet (3.125 mg total) by mouth 2 (two) times daily with a meal. (Patient taking differently: Take 3.125 mg by mouth 2 (two) times daily with a meal. ) 180 tablet 1  . doxycycline (VIBRA-TABS) 100 MG tablet Take 1 tablet (100 mg total) by mouth 2 (two) times daily. 20 tablet  0  . fentaNYL (DURAGESIC) 12 MCG/HR Place 1 patch onto the skin every 3 (three) days. 10 patch 0  . HYDROcodone-acetaminophen (NORCO) 10-325 MG tablet Take 1-2 tablets by mouth every 4 (four) hours as needed for moderate pain. Maximum dose per 24 hours - 8 pills 8 tablet 0  . isosorbide mononitrate (ISMO,MONOKET) 10 MG tablet take 1 tab (10mg ) per tube BID (Patient taking differently: Take 10 mg by mouth. take 1 tab (10mg ) per tube BID) 60 tablet 0  . omeprazole (PRILOSEC) 20 MG capsule Take 1 capsule (20 mg total) by mouth daily. To reduce stomach acid, stomach protection 30 capsule 11  . sacubitril-valsartan (ENTRESTO) 24-26 MG Take 1 tablet by mouth 2 (two) times daily. 180 tablet 3  . tamsulosin (FLOMAX) 0.4 MG CAPS capsule Take 1 capsule (0.4 mg total) by mouth daily after supper. 7 capsule 0   No current facility-administered medications for this encounter.    Facility-Administered Medications Ordered in Other Encounters  Medication Dose Route Frequency Provider Last Rate Last Dose  . topical emolient (BIAFINE) emulsion   Topical Daily Kyung Rudd, MD        Physical Findings: In general this is a well appearing African-American male in no acute distress.  He's alert and oriented x4 and appropriate throughout the examination. Cardiopulmonary assessment is negative for acute distress and he exhibits normal effort.   Lab Findings: Lab Results  Component Value Date   WBC 5.1 09/26/2018   HGB 13.3 09/26/2018  HCT 40.5 09/26/2018   MCV 85 09/26/2018   PLT 204 09/26/2018    Radiographic Findings:  Patient underwent CT imaging in our clinic for post implant dosimetry. The CT will be reviewed by Dr. Tammi Klippel to ensure an adequate distribution of radioactive seeds throughout the prostate gland and that there are no seeds in or near the rectum.  He is scheduled for a prostate MRI at 5 PM today and these images will be fused with his CT images for further evaluation.  We suspect the final  radiation plan and dosimetry will show appropriate coverage of the prostate gland but assured the patient that we would call him if there are any unexpected findings.   Impression/Plan: 68 y.o. gentleman with Stage T1c adenocarcinoma of the prostate with Gleason score of 4+3, and PSA of 9.3. The patient is recovering from the effects of radiation. His urinary symptoms should gradually improve over the next 4-6 months. We talked about this today. He is encouraged by his improvement already and is otherwise pleased with his outcome. We also talked about long-term follow-up for prostate cancer following seed implant. He understands that ongoing PSA determinations and digital rectal exams will help perform surveillance to rule out disease recurrence. He has a follow up appointment scheduled with Jiles Crocker, NP on 10/14/2018 and anticipates a follow-up visit with Dr. Karsten Ro sometime in April 2020. He understands what to expect with his PSA measures. Patient was also educated today about some of the long-term effects from radiation including a small risk for rectal bleeding and possibly erectile dysfunction. We talked about some of the general management approaches to these potential complications. However, I did encourage the patient to contact our office or return at any point if he has questions or concerns related to his previous radiation and prostate cancer.    Nicholos Johns, PA-C

## 2018-10-12 NOTE — Progress Notes (Signed)
Pt here today for a Post Seed appointment. Pt states that the MRI is scheduled for today at 4:30pm. The appointment for [facility] is scheduled for 10/14/18 at [place]. Pt denies having any dysuria or hematuria. Pt denies having any leakage of urine. Pt states that he is emptying his bladder completely.  BP (!) 119/59   Pulse 75   Temp 97.6 F (36.4 C) (Oral)   Wt 139 lb 3.2 oz (63.1 kg)   SpO2 98%   BMI 22.47 kg/m    Wt Readings from Last 3 Encounters:  10/12/18 139 lb 3.2 oz (63.1 kg)  09/26/18 138 lb (62.6 kg)  09/23/18 130 lb (59 kg)

## 2018-10-14 ENCOUNTER — Telehealth: Payer: Self-pay | Admitting: *Deleted

## 2018-10-14 NOTE — Telephone Encounter (Signed)
Patient verified DOB Patient daughter is aware of kidney function level being slightly increased and needing to limit sugar intake with his A1C being in the prediabetic range. Patient will take medications as prescribed and limit sugar intake while continuing his water intake.

## 2018-10-14 NOTE — Progress Notes (Signed)
  Radiation Oncology         (336) [phone] ________________________________  Name: Collin Young MRN: [id number]  Date: 10/12/2018  DOB: 08-Jul-1951  COMPLEX SIMULATION NOTE  NARRATIVE:  The patient was brought to the [facility] today following prostate seed implantation approximately one month ago.  Identity was confirmed.  All relevant records and images related to the planned course of therapy were reviewed.  Then, the patient was set-up supine.  CT images were obtained.  The CT images were loaded into the planning software.  Then the prostate and rectum were contoured.  Treatment planning then occurred.  The implanted iodine 125 seeds were identified by the physics staff for projection of radiation distribution  I have requested : 3D Simulation  I have requested a DVH of the following structures: Prostate and rectum.    ________________________________  Sheral Apley Tammi Klippel, M.D.

## 2018-10-14 NOTE — Telephone Encounter (Signed)
-----   Message from Antony Blackbird, MD sent at 10/13/2018 10:48 PM EST ----- Mild increase in creatinine at 1.34 and glucose of 119

## 2018-10-20 ENCOUNTER — Encounter: Payer: Self-pay | Admitting: Radiation Oncology

## 2018-10-20 ENCOUNTER — Ambulatory Visit: Payer: Self-pay | Admitting: Family Medicine

## 2018-10-20 DIAGNOSIS — Z510 Encounter for antineoplastic radiation therapy: Secondary | ICD-10-CM | POA: Diagnosis not present

## 2018-10-25 NOTE — Progress Notes (Signed)
  Radiation Oncology         [phone] ________________________________  Name: Collin Young MRN: [id number]  Date: 10/20/2018  DOB: 12/12/1950  3D Planning Note   Prostate Brachytherapy Post-Implant Dosimetry  Diagnosis: 68 y.o. gentleman with Stage T1c adenocarcinoma of the prostate with Gleason score of 4+3, and PSA of 9.3   Narrative: On a previous date, Collin Young returned following prostate seed implantation for post implant planning. He underwent CT scan complex simulation to delineate the three-dimensional structures of the pelvis and demonstrate the radiation distribution.  Since that time, the seed localization, and complex isodose planning with dose volume histograms have now been completed.  Results:   Prostate Coverage - The dose of radiation delivered to the 90% or more of the prostate gland (D90) was 117.45% of the prescription dose. This exceeds our goal of greater than 90%. Rectal Sparing - The volume of rectal tissue receiving the prescription dose or higher was 0.0 cc. This falls under our thresholds tolerance of 1.0 cc.  Impression: The prostate seed implant appears to show adequate target coverage and appropriate rectal sparing.  Plan:  The patient will continue to follow with urology for ongoing PSA determinations. I would anticipate a high likelihood for local tumor control with minimal risk for rectal morbidity.  ________________________________  Sheral Apley Tammi Klippel, M.D.

## 2018-10-31 ENCOUNTER — Ambulatory Visit: Payer: Self-pay | Admitting: Family Medicine

## 2018-11-09 ENCOUNTER — Other Ambulatory Visit: Payer: Self-pay

## 2018-11-09 ENCOUNTER — Ambulatory Visit (INDEPENDENT_AMBULATORY_CARE_PROVIDER_SITE_OTHER): Payer: Medicare Other | Admitting: Cardiovascular Disease

## 2018-11-09 ENCOUNTER — Encounter: Payer: Self-pay | Admitting: Cardiovascular Disease

## 2018-11-09 VITALS — BP 96/70 | HR 90 | Ht 63.0 in | Wt 136.0 lb

## 2018-11-09 DIAGNOSIS — I2510 Atherosclerotic heart disease of native coronary artery without angina pectoris: Secondary | ICD-10-CM

## 2018-11-09 DIAGNOSIS — I952 Hypotension due to drugs: Secondary | ICD-10-CM

## 2018-11-09 DIAGNOSIS — E785 Hyperlipidemia, unspecified: Secondary | ICD-10-CM | POA: Diagnosis not present

## 2018-11-09 DIAGNOSIS — I351 Nonrheumatic aortic (valve) insufficiency: Secondary | ICD-10-CM | POA: Diagnosis not present

## 2018-11-09 DIAGNOSIS — I5042 Chronic combined systolic (congestive) and diastolic (congestive) heart failure: Secondary | ICD-10-CM | POA: Diagnosis not present

## 2018-11-09 MED ORDER — METOPROLOL SUCCINATE ER 25 MG PO TB24: ORAL_TABLET | ORAL | 5 refills | Status: DC

## 2018-11-09 NOTE — Progress Notes (Signed)
Cardiology Office Note   Date:  11/09/2018   ID:  Collin Young, DOB 22-Jun-1951, MRN [id number]  PCP:  Antony Blackbird, MD  Cardiologist:   Skeet Latch, MD   Chief Complaint  Patient presents with  . Follow-up    3 months.      History of Present Illness: Collin Young is a 68 y.o. 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  Collin Young was admitted 12/2016 with progressive shortness of breath.  BNP was >2000.  Echo revealed LVEF 10-15% with diffuse hypokinesis, moderate MR and moderate AR.  PASP was 49 mmHg.  He underwent left heart catheterization and was found to have 100% proximal LCx with L-->L collaterals, 99% prox-mid RCA and otherwise mild-moderate CAD.  There was consideration of RCA PCI.  However, this would have required hemodynamic support and was deferred given his frailty and comorbidities.  He was treated with milrinone and IV lasix.  Milrinone was weaned prior to discharge.  During that hospitalization he was also treated for MSSA bacteremia.  He was unable to get a TEE due to his prior cancer.  His Port was removed and a PICC line was placed.   He had a  PEG tube placed due to frank aspiration.  At his last appointment Collin Young was referred for an echo 05/2017 that showed a significant improvement in his LVEF to 45-50% and grade 1 diastolic dysfunction.  There was moderate aortic regurgitation.    Collin Young has been doing well.  He underwent radiation of the prostate and is tolerating this well.  He has had a couple episodes of chest pain that occurred at rest.  He believes that it may have been gas.  He has not had any shortness of breath and he denies any exertional symptoms.  He does report some lightheadedness and dizziness.  It typically is worse after he takes his morning  medications.  He also reports near syncope.  He denies any lower extremity edema, orthopnea, or PND.  He remains very active at work.   Past Medical History:  Diagnosis Date  . Chronic combined systolic and diastolic CHF (congestive heart failure) ([place])    followed by [name]  . COPD (chronic obstructive pulmonary disease) ([place])   . Coronary artery disease cardiologist-- [name]   per cardiac cath 01-05-2017  chronic total occlusion pLCx with collaterals, 99% severe calcified prox. to mid RCA, otherwise mild to moderate CAD (medically managed)  . Esophageal cancer, stage IIIB [facility]) oncologist-  dr ennever/  dr moody   dx 2010  SCC Stage IIIB completed chemoradiation;  localized recurrent left piriform sinus 01/ 2015,  completed concurrent chemoradiatoin 04/ 2015  . GERD (gastroesophageal reflux disease)    09-07-2018   no issues since Gtube removed 06/ 2019  . Headache   . History of alcohol abuse    quit 2001  . History of cancer chemotherapy    2010;   2015  . History of radiation therapy    10-19-2013 to  12-05-2013 pyriform sinus 69.96 Gy/46fx;   Radiation completed 2010 for esophageal cancer  . History of seizure 2001   alcohol withdrawal  . HOH (hard of hearing)   . Hyperlipidemia   . Hypertension   . Ischemic cardiomyopathy 12/2016   01-03-2017  echo,  ef 10-15%/   echo 05-06-2017 EF improved to 45-50%  . Lower urinary tract symptoms (LUTS)   . Prostate cancer [facility]) urologist-  dr ottelin/  oncologist-- [name]   dx 05-27-2018--- Stage T1c,  Gleason 4+3,  PSA 9.3--  scheduled for brachytherapy 09-23-2017  . Renal cyst, left   . Voice hoarseness    secondary to radiation treatment    Past Surgical History:  Procedure Laterality Date  . CARDIOVASCULAR STRESS TEST  10/09/09   normal nuclearr stress test, EF 57% [name])  . IR GASTROSTOMY TUBE MOD SED  01/13/2017  . IR GASTROSTOMY TUBE REMOVAL  02/03/2018  . IR PATIENT EVAL TECH 0-60 MINS  03/25/2017   . IR REMOVAL TUN ACCESS W/ PORT W/O FL MOD SED  01/15/2017  . IR REPLACE G-TUBE SIMPLE WO FLUORO  01/13/2018  . LARYNGOSCOPY N/A 09/15/2013   Procedure: LARYNGOSCOPY;  Surgeon: [name], MD;  Location: [place];  Service: ENT;  Laterality: N/A;  direct laryngoscopy with biopsy and esophagoscopy  . RADIOACTIVE SEED IMPLANT N/A 09/23/2018   Procedure: RADIOACTIVE SEED IMPLANT/BRACHYTHERAPY IMPLANT;  Surgeon: [name], MD;  Location: [facility];  Service: Urology;  Laterality: N/A;  . RIGHT/LEFT HEART CATH AND CORONARY ANGIOGRAPHY N/A 01/05/2017   Procedure: Right/Left Heart Cath and Coronary Angiography;  Surgeon: [name], MD;  Location: [place] CV LAB;  Service: Cardiovascular;  Laterality: N/A;  . TRANSTHORACIC ECHOCARDIOGRAM  05/06/2017   ef 45-50%,  grade 1 diastolic dysfunction/  AV severe calcified non coronary cusp with moderate regurg. , no stenosis (valve area per echo 01-05-2017 1.08cm^2)/  mild MR, TR, and PR/  mild LAE     Current Outpatient Medications  Medication Sig Dispense Refill  . aspirin EC 81 MG tablet Take 81 mg by mouth daily.    Marland Kitchen atorvastatin (LIPITOR) 80 MG tablet Take 1 tablet (80 mg total) by mouth daily. (Patient taking differently: Take 80 mg by mouth every morning. ) 30 tablet 5  . doxycycline (VIBRA-TABS) 100 MG tablet Take 1 tablet (100 mg total) by mouth 2 (two) times daily. 20 tablet 0  . fentaNYL (DURAGESIC) 12 MCG/HR Place 1 patch onto the skin every 3 (three) days. 10 patch 0  . HYDROcodone-acetaminophen (NORCO) 10-325 MG tablet Take 1-2 tablets by mouth every 4 (four) hours as needed for moderate pain. Maximum dose per 24 hours - 8 pills 8 tablet 0  . omeprazole (PRILOSEC) 20 MG capsule Take 1 capsule (20 mg total) by mouth daily. To reduce stomach acid, stomach protection 30 capsule 11  . sacubitril-valsartan (ENTRESTO) 24-26 MG Take 1 tablet by mouth 2 (two) times daily. 180 tablet 3  . tamsulosin (FLOMAX) 0.4 MG CAPS  capsule Take 1 capsule (0.4 mg total) by mouth daily after supper. 7 capsule 0  . metoprolol succinate (TOPROL XL) 25 MG 24 hr tablet 1/2 TABLET DAILY 15 tablet 5   No current facility-administered medications for this visit.    Facility-Administered Medications Ordered in Other Visits  Medication Dose Route Frequency Provider Last Rate Last Dose  . topical emolient (BIAFINE) emulsion   Topical Daily Kyung Rudd, MD        Allergies:   Patient has no known allergies.    Social History:  The patient  reports that he quit smoking about 9 years ago. His smoking use included cigarettes. He started smoking about 58 years ago. He has a 40.00 pack-year smoking history. He quit smokeless tobacco use about 19 years  ago.  His smokeless tobacco use included chew. He reports previous alcohol use. He reports that he does not use drugs.   Family History:  The patient's family history includes Breast cancer in his mother.    ROS:  Please see the history of present illness.   Otherwise, review of systems are positive for none.   All other systems are reviewed and negative.    PHYSICAL EXAM: VS:  BP 96/70 (BP Location: Left Arm, Patient Position: Sitting, Cuff Size: Normal)   Pulse 90   Ht 5\' 3"  (1.6 m)   Wt 136 lb (61.7 kg)   BMI 24.09 kg/m  , BMI Body mass index is 24.09 kg/m. GENERAL:  Well appearing HEENT: Pupils equal round and reactive, fundi not visualized, oral mucosa unremarkable NECK:  No jugular venous distention, waveform within normal limits, carotid upstroke brisk and symmetric, no bruits, no thyromegaly LYMPHATICS:  No cervical adenopathy LUNGS:  Clear to auscultation bilaterally HEART:  RRR.  PMI not displaced or sustained,S1 and S2 within normal limits, no S3, no S4, no clicks, no rubs, II/VI systolic murmurs ABD:  Flat, positive bowel sounds normal in frequency in pitch, no bruits, no rebound, no guarding, no midline pulsatile mass, no hepatomegaly, no splenomegaly EXT:  2 plus  pulses throughout, no edema, no cyanosis no clubbing SKIN:  No rashes no nodules NEURO:  Cranial nerves II through XII grossly intact, motor grossly intact throughout PSYCH:  Cognitively intact, oriented to person place and time   EKG:  EKG is not ordered today. Sinus rhythm.  Rate 83 bpm.  Lateral TWI.   Echo 05/06/17: Study Conclusions  - Left ventricle: Diffuse hypokinesis slightly worse in inferior   wall. The cavity size was mildly dilated. Systolic function was   mildly reduced. The estimated ejection fraction was in the range   of 45% to 50%. Doppler parameters are consistent with abnormal   left ventricular relaxation (grade 1 diastolic dysfunction). - Aortic valve: Severely calcified non coronary cusp. There was   moderate regurgitation. - Mitral valve: There was mild regurgitation. - Left atrium: The atrium was mildly dilated. - Atrial septum: No defect or patent foramen ovale was identified.  Echo 01/03/17: Study Conclusions  - Left ventricle: The cavity size was normal. Wall thickness was   normal. The estimated ejection fraction was in the range of 10%   to 15%. Diffuse hypokinesis. DIastolic function is abnormal,   indeterminant grade. - Aortic valve: There was moderate regurgitation. Valve area (VTI):   1.08 cm^2. Valve area (Vmax): 1.5 cm^2. Valve area (Vmean): 1.5   cm^2. - Mitral valve: There was moderate regurgitation. - Left atrium: The atrium was mildly dilated. - Right ventricle: Systolic function was mildly to moderately   reduced. - Tricuspid valve: There was moderate regurgitation. - Pulmonary arteries: Systolic pressure was moderately increased.   PA peak pressure: 49 mm Hg (S).  LHC 01/05/17:  Prox RCA to Mid RCA lesion, 99 %stenosed.  Mid RCA lesion, 30 %stenosed.  Dist RCA lesion, 40 %stenosed.  RPDA lesion, 20 %stenosed.  Prox Cx lesion, 100 %stenosed.  Ost Ramus to Ramus lesion, 50 %stenosed.  Ost 3rd Diag to 3rd Diag lesion, 30  %stenosed.  Mid LAD lesion, 40 %stenosed.  Ost LAD to Prox LAD lesion, 20 %stenosed.  Ost LM to LM lesion, 40 %stenosed.  Hemodynamic findings consistent with mild pulmonary hypertension.   1. Severe calcific disease in the proximal to mid RCA 2. Chronic total occlusion of the proximal  Circumflex 3. Moderate left main and mid LAD stenosis 4. Elevated filling pressures   Recent Labs: 07/08/2018: TSH 1.849 09/16/2018: ALT 16 09/26/2018: Hemoglobin 13.3; Platelets 204 10/05/2018: BUN 19; Creatinine, Ser 1.34; Potassium 4.7; Sodium 139    Lipid Panel    Component Value Date/Time   CHOL 159 02/21/2018 0817   TRIG 40 02/21/2018 0817   HDL 71 02/21/2018 0817   CHOLHDL 2.2 02/21/2018 0817   LDLCALC 80 02/21/2018 0817      Wt Readings from Last 3 Encounters:  11/09/18 136 lb (61.7 kg)  10/12/18 139 lb 3.2 oz (63.1 kg)  09/26/18 138 lb (62.6 kg)     ASSESSMENT AND PLAN:  # Chronic systolic and diastolic heart failure:  # Moderate MR: # Moderate AR:  LVEF significantly improved from 10-15% to 45-50%.  He is doing extremely well and remains asymptomatic. BP is low and he has lightheadedness/dizziness.  We will stop carvedilol and isosorbide.  Start metoprolol succinate 12.5 mg daily and continue Entresto.  Likely ischemic cardiomyopathy.    # Obstructive CAD:  # Hyperlipidemia:  Medically managed due to acute illness at the time of diagnosis.  100% LCx and 99% RCA.  The RCA would require hemodynamic support.  He has no chest pain so we will continue medical management for now.  Switch carvedilol to metoprolol and stop isosorbide as above.  Continue aspirin and atorvastatin.   Current medicines are reviewed at length with the patient today.  The patient does not have concerns regarding medicines.  The following changes have been made:  none  Labs/ tests ordered today include:  No orders of the defined types were placed in this encounter.    Disposition:   FU with Lenus Trauger  C. Oval Linsey, MD, [facility] in 6 months.  APP in 4-6 weeks.    Signed, Brelyn Woehl C. Oval Linsey, MD, [facility]  11/09/2018 10:05 AM    [place]

## 2018-11-09 NOTE — Patient Instructions (Signed)
Medication Instructions:  STOP CARVEDILOL   STOP ISOSORBIDE   START METOPROLOL SUCC 25 MG 1/2 TABLET DAILY   If you need a refill on your cardiac medications before your next appointment, please call your pharmacy.   Lab work: NONE  Testing/Procedures: NONE  Follow-Up: At [facility], you and your health needs are our priority.  As part of our continuing mission to provide you with exceptional heart care, we have created designated Provider Care Teams.  These Care Teams include your primary Cardiologist (physician) and Advanced Practice Providers (APPs -  Physician Assistants and Nurse Practitioners) who all work together to provide you with the care you need, when you need it. You will need a follow up appointment in 6 months.  Please call our office 2 months in advance to schedule this appointment.  You may see Skeet Latch, MD or one of the following Advanced Practice Providers on your designated Care Team:   Kerin Ransom, PA-C Roby Lofts, [place] . Sande Rives, PA-C  Your physician recommends that you schedule a follow-up appointment in: 1 MONTH PA/NP 4-6 WEEK

## 2018-11-11 ENCOUNTER — Inpatient Hospital Stay: Payer: Medicare Other

## 2018-11-11 ENCOUNTER — Other Ambulatory Visit: Payer: Self-pay

## 2018-11-11 ENCOUNTER — Inpatient Hospital Stay: Payer: Medicare Other | Attending: Hematology & Oncology | Admitting: Hematology & Oncology

## 2018-11-11 VITALS — BP 163/76 | HR 71 | Temp 97.9°F | Resp 18 | Ht 66.0 in | Wt 133.4 lb

## 2018-11-11 DIAGNOSIS — C12 Malignant neoplasm of pyriform sinus: Secondary | ICD-10-CM

## 2018-11-11 DIAGNOSIS — C61 Malignant neoplasm of prostate: Secondary | ICD-10-CM

## 2018-11-11 DIAGNOSIS — Z8501 Personal history of malignant neoplasm of esophagus: Secondary | ICD-10-CM | POA: Diagnosis not present

## 2018-11-11 DIAGNOSIS — Z931 Gastrostomy status: Secondary | ICD-10-CM | POA: Insufficient documentation

## 2018-11-11 DIAGNOSIS — Z79899 Other long term (current) drug therapy: Secondary | ICD-10-CM | POA: Insufficient documentation

## 2018-11-11 DIAGNOSIS — E032 Hypothyroidism due to medicaments and other exogenous substances: Secondary | ICD-10-CM

## 2018-11-11 LAB — CBC WITH DIFFERENTIAL (CANCER CENTER ONLY)
Abs Immature Granulocytes: 0.01 10*3/uL (ref 0.00–0.07)
Basophils Absolute: 0 10*3/uL (ref 0.0–0.1)
Basophils Relative: 1 %
Eosinophils Absolute: 0.1 10*3/uL (ref 0.0–0.5)
Eosinophils Relative: 2 %
HCT: 46.8 % (ref 39.0–52.0)
HEMOGLOBIN: 14.8 g/dL (ref 13.0–17.0)
Immature Granulocytes: 0 %
Lymphocytes Relative: 31 %
Lymphs Abs: 1.2 10*3/uL (ref 0.7–4.0)
MCH: 28.6 pg (ref 26.0–34.0)
MCHC: 31.6 g/dL (ref 30.0–36.0)
MCV: 90.3 fL (ref 80.0–100.0)
Monocytes Absolute: 0.8 10*3/uL (ref 0.1–1.0)
Monocytes Relative: 21 %
Neutro Abs: 1.7 10*3/uL (ref 1.7–7.7)
Neutrophils Relative %: 45 %
Platelet Count: 204 10*3/uL (ref 150–400)
RBC: 5.18 MIL/uL (ref 4.22–5.81)
RDW: 13.8 % (ref 11.5–15.5)
WBC: 3.9 10*3/uL — AB (ref 4.0–10.5)
nRBC: 0 % (ref 0.0–0.2)

## 2018-11-11 LAB — CMP (CANCER CENTER ONLY)
ALT: 20 U/L (ref 0–44)
AST: 25 U/L (ref 15–41)
Albumin: 4.4 g/dL (ref 3.5–5.0)
Alkaline Phosphatase: 49 U/L (ref 38–126)
Anion gap: 7 (ref 5–15)
BUN: 15 mg/dL (ref 8–23)
CO2: 31 mmol/L (ref 22–32)
Calcium: 10.1 mg/dL (ref 8.9–10.3)
Chloride: 99 mmol/L (ref 98–111)
Creatinine: 1.43 mg/dL — ABNORMAL HIGH (ref 0.61–1.24)
GFR, Est AFR Am: 58 mL/min — ABNORMAL LOW (ref >60)
GFR, Estimated: 50 mL/min — ABNORMAL LOW (ref >60)
Glucose, Bld: 114 mg/dL — ABNORMAL HIGH (ref 70–99)
Potassium: 5.2 mmol/L — ABNORMAL HIGH (ref 3.5–5.1)
Sodium: 137 mmol/L (ref 135–145)
Total Bilirubin: 0.4 mg/dL (ref 0.3–1.2)
Total Protein: 7.6 g/dL (ref 6.5–8.1)

## 2018-11-11 NOTE — Progress Notes (Signed)
Hematology and Oncology Follow Up Visit  Collin Young [id number] 09-24-1950 68 y.o. 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  He is doing pretty well.  He was seen by radiation oncology for the prostate cancer.  He had prostate seed implants.  He had very good coverage of the prostate with the seed implant.  He is followed by urology for this at this point.  His feeding tube is out.  He is doing great.  He is eating.  He is gaining weight.  He is quite happy about this.  He has had no problems with diarrhea.  He has had no bleeding.  He has had no dysuria or hematuria.  There is been no fever.  He has had no cough.  He is on fentanyl patches.  These really have helped with the chronic pain that he has had from past surgeries and radiation.  Currently, his performance status is ECOG 2.  Medications:  Current Outpatient Medications:  .  aspirin EC 81 MG tablet, Take 81 mg by mouth daily., Disp: , Rfl:  .  atorvastatin (LIPITOR) 80 MG tablet, Take 1 tablet (80 mg total) by mouth daily. (Patient taking differently: Take 80 mg by mouth every morning. ), Disp: 30 tablet, Rfl: 5 .  doxycycline (VIBRA-TABS) 100 MG tablet, Take 1 tablet (100 mg total) by mouth 2 (two) times daily., Disp: 20 tablet, Rfl: 0 .  fentaNYL (DURAGESIC) 12 MCG/HR, Place 1 patch onto the skin every 3 (three) days., Disp: 10 patch, Rfl: 0 .  HYDROcodone-acetaminophen (NORCO) 10-325 MG tablet, Take 1-2 tablets by mouth every 4 (four) hours as needed for moderate pain. Maximum dose per 24 hours - 8 pills, Disp: 8 tablet, Rfl: 0 .  metoprolol succinate (TOPROL XL) 25 MG 24 hr tablet, 1/2 TABLET DAILY, Disp: 15 tablet, Rfl: 5  .  omeprazole (PRILOSEC) 20 MG capsule, Take 1 capsule (20 mg total) by mouth daily. To reduce stomach acid, stomach protection, Disp: 30 capsule, Rfl: 11 .  sacubitril-valsartan (ENTRESTO) 24-26 MG, Take 1 tablet by mouth 2 (two) times daily., Disp: 180 tablet, Rfl: 3 .  tamsulosin (FLOMAX) 0.4 MG CAPS capsule, Take 1 capsule (0.4 mg total) by mouth daily after supper., Disp: 7 capsule, Rfl: 0 No current facility-administered medications for this visit.   Facility-Administered Medications Ordered in Other Visits:  .  topical emolient (BIAFINE) emulsion, , Topical, Daily, Kyung Rudd, MD  Allergies: No Known Allergies  Past Medical History, Surgical history, Social history, and Family History were reviewed and updated.  Review of Systems: Review of Systems  Constitutional: Negative.   HENT: Negative.   Eyes: Negative.   Respiratory: Negative.   Cardiovascular: Negative.   Gastrointestinal: Negative.   Genitourinary: Negative.   Musculoskeletal: Negative.   Skin: Negative.   Neurological: Negative.   Endo/Heme/Allergies: Negative.   Psychiatric/Behavioral: Negative.      Physical Exam:  vitals were not taken for this visit.   Physical Exam Vitals signs reviewed.  HENT:     Head: Normocephalic and atraumatic.  Eyes:     Pupils: Pupils are equal, round, and reactive to light.  Neck:     Musculoskeletal: Normal range of motion.  Cardiovascular:  Rate and Rhythm: Normal rate and regular rhythm.     Heart sounds: Normal heart sounds.  Pulmonary:     Effort: Pulmonary effort is normal.     Breath sounds: Normal breath sounds.  Abdominal:     General: Bowel sounds are normal.     Palpations: Abdomen is soft.     Comments: He has a feeding tube in place.  There is no erythema or exudate from the feeding tube entry site.  Musculoskeletal: Normal range of motion.        General: No tenderness or deformity.  Lymphadenopathy:     Cervical: No cervical adenopathy.  Skin:     General: Skin is warm and dry.     Findings: No erythema or rash.  Neurological:     Mental Status: He is alert and oriented to person, place, and time.  Psychiatric:        Behavior: Behavior normal.        Thought Content: Thought content normal.        Judgment: Judgment normal.     Lab Results  Component Value Date   WBC 3.9 (L) 11/11/2018   HGB 14.8 11/11/2018   HCT 46.8 11/11/2018   MCV 90.3 11/11/2018   PLT 204 11/11/2018     Chemistry      Component Value Date/Time   NA 137 11/11/2018 1511   NA 139 10/05/2018 1112   NA 139 07/30/2017 0926   NA 137 03/10/2017 1000   K 5.2 (H) 11/11/2018 1511   K 3.9 07/30/2017 0926   K 4.3 03/10/2017 1000   CL 99 11/11/2018 1511   CL 96 (L) 07/30/2017 0926   CO2 31 11/11/2018 1511   CO2 33 07/30/2017 0926   CO2 29 03/10/2017 1000   BUN 15 11/11/2018 1511   BUN 19 10/05/2018 1112   BUN 17 07/30/2017 0926   BUN 14.7 03/10/2017 1000   CREATININE 1.43 (H) 11/11/2018 1511   CREATININE 1.4 (H) 07/30/2017 0926   CREATININE 1.1 03/10/2017 1000      Component Value Date/Time   CALCIUM 10.1 11/11/2018 1511   CALCIUM 9.6 07/30/2017 0926   CALCIUM 10.0 03/10/2017 1000   ALKPHOS 49 11/11/2018 1511   ALKPHOS 49 07/30/2017 0926   ALKPHOS 51 03/10/2017 1000   AST 25 11/11/2018 1511   AST 23 03/10/2017 1000   ALT 20 11/11/2018 1511   ALT 31 07/30/2017 0926   ALT 18 03/10/2017 1000   BILITOT 0.4 11/11/2018 1511   BILITOT 0.41 03/10/2017 1000         Impression and Plan: Collin Young is 68 year old gentleman. He had a localized recurrence of carcinoma of the left pyriform sinus. He underwent concurrent chemotherapy and radiation therapy. He finished his treatment back in April of 2015.   Has a history of squamous cell carcinoma of the esophagus. He was treated with radiation chemotherapy. This was completed back in 2010.  He now has stage Ic prostate cancer.  He underwent seed implants for the prostate.  I think this was  placed in February 2020.  We will get him back to see [place] in 4 months.  Again, he is being followed closely by radiation oncology and urology for the prostate cancer.  I am just happy that his quality of life is doing so well.   [name], MD 3/13/20203:59 PM

## 2018-11-14 ENCOUNTER — Telehealth: Payer: Self-pay | Admitting: Hematology & Oncology

## 2018-11-14 LAB — TSH: TSH: 2.199 u[IU]/mL (ref 0.320–4.118)

## 2018-11-14 NOTE — Telephone Encounter (Signed)
Spoke with patient daughter to inform of next appt 03/16/19 at 0900 per 3/13 LOS

## 2018-11-28 ENCOUNTER — Encounter: Payer: Self-pay | Admitting: Family Medicine

## 2018-11-30 ENCOUNTER — Ambulatory Visit: Payer: Medicare Other | Attending: Family Medicine | Admitting: Family Medicine

## 2018-11-30 ENCOUNTER — Other Ambulatory Visit: Payer: Self-pay | Admitting: *Deleted

## 2018-11-30 ENCOUNTER — Other Ambulatory Visit: Payer: Self-pay

## 2018-11-30 DIAGNOSIS — C61 Malignant neoplasm of prostate: Secondary | ICD-10-CM

## 2018-11-30 DIAGNOSIS — C12 Malignant neoplasm of pyriform sinus: Secondary | ICD-10-CM

## 2018-11-30 MED ORDER — FENTANYL 12 MCG/HR TD PT72: 1.0000 | MEDICATED_PATCH | TRANSDERMAL | 0 refills | Status: DC

## 2018-12-08 ENCOUNTER — Telehealth: Payer: Self-pay

## 2018-12-08 NOTE — Telephone Encounter (Signed)
UNABLE TO REACH PT UNABLE TO LEAVE VM FOR APPT HANGE TO VIRTUAL

## 2018-12-15 ENCOUNTER — Telehealth: Payer: Self-pay

## 2018-12-15 ENCOUNTER — Telehealth: Payer: Self-pay | Admitting: Physician Assistant

## 2018-12-15 NOTE — Telephone Encounter (Signed)
Virtual Visit Pre-Appointment Phone Call  Steps For Call:  1. Confirm consent - "In the setting of the current Covid19 crisis, you are scheduled for a TELEPHONE visit with HAO MENG, PA-C on 12/16/2018 at 2:00PM.  Just as we do with many in-office visits, in order for you to participate in this visit, we must obtain consent.  If you'd like, I can send this to your mychart (if signed up) or email for you to review.  Otherwise, I can obtain your verbal consent now.  All virtual visits are billed to your insurance company just like a normal visit would be.  By agreeing to a virtual visit, we'd like you to understand that the technology does not allow for your provider to perform an examination, and thus may limit your provider's ability to fully assess your condition.  Finally, though the technology is pretty good, we cannot assure that it will always work on either your or our end, and in the setting of a video visit, we may have to convert it to a phone-only visit.  In either situation, we cannot ensure that we have a secure connection.  Are you willing to proceed?" STAFF: Did the patient verbally acknowledge consent to telehealth visit? Document YES/NO here: YES  2. Confirm the BEST phone number to call the day of the visit by including in appointment notes  3. Give patient instructions for WebEx/MyChart download to smartphone as below or Doximity/Doxy.me if video visit (depending on what platform provider is using)  4. Advise patient to be prepared with their blood pressure, heart rate, weight, any heart rhythm information, their current medicines, and a piece of paper and pen handy for any instructions they may receive the day of their visit  5. Inform patient they will receive a phone call 15 minutes prior to their appointment time (may be from unknown caller ID) so they should be prepared to answer  6. Confirm that appointment type is correct in Epic appointment notes (VIDEO vs PHONE)      TELEPHONE CALL NOTE  Collin Young has been deemed a candidate for a follow-up tele-health visit to limit community exposure during the Covid-19 pandemic. I spoke with the patient via phone to ensure availability of phone/video source, confirm preferred email & phone number, and discuss instructions and expectations.  I reminded Collin Young to be prepared with any vital sign and/or heart rhythm information that could potentially be obtained via home monitoring, at the time of his visit. I reminded Collin Young to expect a phone call at the time of his visit if his visit.  Jacqulynn Cadet, [place] 12/15/2018 1:55 PM   INSTRUCTIONS FOR DOWNLOADING THE [facility] APP TO SMARTPHONE  - If Apple, ask patient to go to [employer] and type in WebEx in the search bar. [place] [employer], the blue/green circle. If Android, go to [employer] and type in [employer] in the search bar. The app is free but as with any other app downloads, their phone may require them to verify saved payment information or Apple/Android password.  - The patient does NOT have to create an account. - On the day of the visit, the assist will walk the patient through joining the meeting with the meeting number/password.  INSTRUCTIONS FOR DOWNLOADING THE MYCHART APP TO SMARTPHONE  - The patient must first make sure to have activated MyChart and know their login information - If Apple, go to [employer] and type in [employer]  in the search bar and download the app. If Android, ask patient to go to [employer] and type in Russell in the search bar and download the app. The app is free but as with any other app downloads, their phone may require them to verify saved payment information or Apple/Android password.  - The patient will need to then log into the app with their MyChart username and password, and select [facility] as their healthcare provider to link the account. When it is time for your visit, go to the MyChart  app, find appointments, and click Begin Video Visit. Be sure to Select Allow for your device to access the Microphone and Camera for your visit. You will then be connected, and your provider will be with you shortly.  **If they have any issues connecting, or need assistance please contact MyChart service desk [phone])**  **If using a computer, in order to ensure the best quality for their visit they will need to use either of the following Internet Browsers: [employer], or Google Chrome**  IF USING DOXIMITY or DOXY.ME - The patient will receive a link just prior to their visit, either by text or email (to be determined day of appointment depending on if it's doxy.me or Doximity).     FULL LENGTH CONSENT FOR TELE-HEALTH VISIT   I hereby voluntarily request, consent and authorize [facility] and its employed or contracted physicians, physician assistants, nurse practitioners or other licensed health care professionals (the Practitioner), to provide me with telemedicine health care services (the "Services") as deemed necessary by the treating Practitioner. I acknowledge and consent to receive the Services by the Practitioner via telemedicine. I understand that the telemedicine visit will involve communicating with the Practitioner through live audiovisual communication technology and the disclosure of certain medical information by electronic transmission. I acknowledge that I have been given the opportunity to request an in-person assessment or other available alternative prior to the telemedicine visit and am voluntarily participating in the telemedicine visit.  I understand that I have the right to withhold or withdraw my consent to the use of telemedicine in the course of my care at any time, without affecting my right to future care or treatment, and that the Practitioner or I may terminate the telemedicine visit at any time. I understand that I have the right to inspect all  information obtained and/or recorded in the course of the telemedicine visit and may receive copies of available information for a reasonable fee.  I understand that some of the potential risks of receiving the Services via telemedicine include:  Marland Kitchen Delay or interruption in medical evaluation due to technological equipment failure or disruption; . Information transmitted may not be sufficient (e.g. poor resolution of images) to allow for appropriate medical decision making by the Practitioner; and/or  . In rare instances, security protocols could fail, causing a breach of personal health information.  Furthermore, I acknowledge that it is my responsibility to provide information about my medical history, conditions and care that is complete and accurate to the best of my ability. I acknowledge that Practitioner's advice, recommendations, and/or decision may be based on factors not within their control, such as incomplete or inaccurate data provided by me or distortions of diagnostic images or specimens that may result from electronic transmissions. I understand that the practice of medicine is not an exact science and that Practitioner makes no warranties or guarantees regarding treatment outcomes. I acknowledge that I will receive a copy of  this consent concurrently upon execution via email to the email address I last provided but may also request a printed copy by calling the office of [facility].    I understand that my insurance will be billed for this visit.   I have read or had this consent read to me. . I understand the contents of this consent, which adequately explains the benefits and risks of the Services being provided via telemedicine.  . I have been provided ample opportunity to ask questions regarding this consent and the Services and have had my questions answered to my satisfaction. . I give my informed consent for the services to be provided through the use of telemedicine in my  medical care  By participating in this telemedicine visit I agree to the above.

## 2018-12-15 NOTE — Telephone Encounter (Signed)
Called what I thought to be the patient's mobile number was acutally his daughter's Mardee Postin, who is on the patient's DPR. She informed me that she handles her fathers affairs. Mrs. Candida Peeling was informed of her father's upcoming appointment and switching it to a virtual visit (telephone/video). She stated that we would have to call her and she will 3 way call her father in due to him wearing hearing aids and it would be hard for him to understand. Went over consent and if he has a BP machine in the home and how some will be calling 10-15 minutes before the scheduled time to go over medications and vitals.

## 2018-12-15 NOTE — Telephone Encounter (Signed)
TELEPHONE, CONSENT GIVEN, VITALS, 12/15/18-TA...UNABLE TO LEAVE VM 4-6 week per [employer] Smart phone/Declined MyChart/pre reg complete 12-15-18 ST

## 2018-12-16 ENCOUNTER — Encounter: Payer: Self-pay | Admitting: Physician Assistant

## 2018-12-16 ENCOUNTER — Telehealth (INDEPENDENT_AMBULATORY_CARE_PROVIDER_SITE_OTHER): Payer: Medicare Other | Admitting: Physician Assistant

## 2018-12-16 VITALS — BP 132/60 | HR 97

## 2018-12-16 DIAGNOSIS — I350 Nonrheumatic aortic (valve) stenosis: Secondary | ICD-10-CM

## 2018-12-16 DIAGNOSIS — R079 Chest pain, unspecified: Secondary | ICD-10-CM

## 2018-12-16 DIAGNOSIS — I5042 Chronic combined systolic (congestive) and diastolic (congestive) heart failure: Secondary | ICD-10-CM

## 2018-12-16 DIAGNOSIS — I2510 Atherosclerotic heart disease of native coronary artery without angina pectoris: Secondary | ICD-10-CM

## 2018-12-16 DIAGNOSIS — R0789 Other chest pain: Secondary | ICD-10-CM | POA: Diagnosis not present

## 2018-12-16 NOTE — Patient Instructions (Signed)
Medication Instructions:   Your physician recommends that you continue on your current medications as directed. Please refer to the Current Medication list given to you today.  If you need a refill on your cardiac medications before your next appointment, please call your pharmacy.   Lab work:  NONE ordered at this time of appointment   If you have labs (blood work) drawn today and your tests are completely normal, you will receive your results only by: Marland Kitchen MyChart Message (if you have MyChart) OR . A paper copy in the mail If you have any lab test that is abnormal or we need to change your treatment, we will call you to review the results.  Testing/Procedures:  NONE ordered at this time of appointment   Follow-Up: At [facility], you and your health needs are our priority.  As part of our continuing mission to provide you with exceptional heart care, we have created designated Provider Care Teams.  These Care Teams include your primary Cardiologist (physician) and Advanced Practice Providers (APPs -  Physician Assistants and Nurse Practitioners) who all work together to provide you with the care you need, when you need it. You will need a follow up appointment in 5 months.  Please call our office 2 months in advance to schedule this appointment.  You may see Skeet Latch, MD or one of the following Advanced Practice Providers on your designated Care Team:   Kerin Ransom, PA-C Roby Lofts, [place] . Sande Rives, PA-C  Any Other Special Instructions Will Be Listed Below (If Applicable).

## 2018-12-16 NOTE — Progress Notes (Signed)
Virtual Visit via Telephone Note   This visit type was conducted due to national recommendations for restrictions regarding the COVID-19 Pandemic (e.g. social distancing) in an effort to limit this patient's exposure and mitigate transmission in our community.  Due to his co-morbid illnesses, this patient is at least at moderate risk for complications without adequate follow up.  This format is felt to be most appropriate for this patient at this time.  The patient did not have access to video technology/had technical difficulties with video requiring transitioning to audio format only (telephone).  All issues noted in this document were discussed and addressed.  No physical exam could be performed with this format.  Please refer to the patient's chart for his  consent to telehealth for [facility].   Evaluation Performed:  Follow-up visit  Date:  12/18/2018   ID:  Collin Young, DOB [birth year], MRN [id number]  Patient Location: Home Provider Location: Home  PCP:  Antony Blackbird, MD  Cardiologist:  Skeet Latch, MD  Electrophysiologist:  None   Chief Complaint:  followup  History of Present Illness:    Collin Young is a 67 y.o. 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  He was admitted in May 2018 with progressive shortness of breath.  BNP was greater than 2000.  Echocardiogram revealed EF of 10 to 15% with diffuse hypokinesis, moderate MR and moderate AR, PASP 49 mmHg.  Cardiac catheterization showed 100% proximal left circumflex artery with left to left collateral, 99% proximal to mid RCA lesion, otherwise mild to moderate COPD.  There was consideration for RCA PCI however this will require hemodynamic support and was deferred given his frailty and comorbidities.  He was treated with milrinone and IV Lasix.  Milrinone was weaned  prior to discharge.  He was also treated for MSSA bacteremia during the same hospitalization.  Last echocardiogram obtained in September 2018 showed EF has improved to 45 to 50% with grade 1 DD, moderate aortic regurgitation.  Patient was last seen by Dr. Oval Linsey on 11/09/2018, at which time, he complained of intermittent chest discomfort, he was not sure if this was related to gas.  It was recommended to continue observation.  He was placed on Toprol-XL 12.5 mg daily because of low blood pressure.  Imdur was also stopped as well.  Since then, his blood pressure has certainly improved.  He denies any further chest discomfort or shortness of breath.  He has no lower extremity edema, orthopnea or PND.  The patient does not have symptoms concerning for COVID-19 infection (fever, chills, cough, or new shortness of breath).    Past Medical History:  Diagnosis Date   Chronic combined systolic and diastolic CHF (congestive heart failure) ([place])    followed by [name]   COPD (chronic obstructive pulmonary disease) ([place])    Coronary artery disease cardiologist-- [name]   per cardiac cath 01-05-2017  chronic total occlusion pLCx with collaterals, 99% severe calcified prox. to mid RCA, otherwise mild to moderate CAD (medically managed)   Esophageal cancer, stage IIIB [facility]) oncologist-  dr ennever/  dr moody   dx 2010  SCC Stage IIIB completed chemoradiation;  localized recurrent left piriform sinus 01/ 2015,  completed concurrent chemoradiatoin 04/ 2015   GERD (gastroesophageal reflux disease)    09-07-2018   no issues since Gtube removed  06/ 2019   Headache    History of alcohol abuse    quit 2001   History of cancer chemotherapy    2010;   2015   History of radiation therapy    10-19-2013 to  12-05-2013 pyriform sinus 69.96 Gy/70fx;   Radiation completed 2010 for esophageal cancer   History of seizure 2001   alcohol withdrawal   HOH (hard of hearing)     Hyperlipidemia    Hypertension    Ischemic cardiomyopathy 12/2016   01-03-2017  echo,  ef 10-15%/   echo 05-06-2017 EF improved to 45-50%   Lower urinary tract symptoms (LUTS)    Prostate cancer [facility]) urologist-  dr ottelin/  oncologist-- [name]   dx 05-27-2018--- Stage T1c,  Gleason 4+3,  PSA 9.3--  scheduled for brachytherapy 09-23-2017   Renal cyst, left    Voice hoarseness    secondary to radiation treatment   Past Surgical History:  Procedure Laterality Date   CARDIOVASCULAR STRESS TEST  10/09/09   normal nuclearr stress test, EF 57% ([place])   IR GASTROSTOMY TUBE MOD SED  01/13/2017   IR GASTROSTOMY TUBE REMOVAL  02/03/2018   IR PATIENT EVAL TECH 0-60 MINS  03/25/2017   IR REMOVAL TUN ACCESS W/ PORT W/O FL MOD SED  01/15/2017   IR REPLACE G-TUBE SIMPLE WO FLUORO  01/13/2018   LARYNGOSCOPY N/A 09/15/2013   Procedure: LARYNGOSCOPY;  Surgeon: [name], MD;  Location: [facility];  Service: ENT;  Laterality: N/A;  direct laryngoscopy with biopsy and esophagoscopy   RADIOACTIVE SEED IMPLANT N/A 09/23/2018   Procedure: RADIOACTIVE SEED IMPLANT/BRACHYTHERAPY IMPLANT;  Surgeon: [name], MD;  Location: [facility];  Service: Urology;  Laterality: N/A;   RIGHT/LEFT HEART CATH AND CORONARY ANGIOGRAPHY N/A 01/05/2017   Procedure: Right/Left Heart Cath and Coronary Angiography;  Surgeon: [name], MD;  Location: [facility];  Service: Cardiovascular;  Laterality: N/A;   TRANSTHORACIC ECHOCARDIOGRAM  05/06/2017   ef 45-50%,  grade 1 diastolic dysfunction/  AV severe calcified non coronary cusp with moderate regurg. , no stenosis (valve area per echo 01-05-2017 1.08cm^2)/  mild MR, TR, and PR/  mild LAE     Current Meds  Medication Sig   aspirin EC 81 MG tablet Take 81 mg by mouth daily.   atorvastatin (LIPITOR) 80 MG tablet Take 1 tablet (80 mg total) by mouth daily. (Patient taking differently: Take 80 mg by mouth every morning. )    fentaNYL (DURAGESIC) 12 MCG/HR Place 1 patch onto the skin every 3 (three) days.   HYDROcodone-acetaminophen (NORCO) 10-325 MG tablet Take 1-2 tablets by mouth every 4 (four) hours as needed for moderate pain. Maximum dose per 24 hours - 8 pills   metoprolol succinate (TOPROL XL) 25 MG 24 hr tablet 1/2 TABLET DAILY   sacubitril-valsartan (ENTRESTO) 24-26 MG Take 1 tablet by mouth 2 (two) times daily.     Allergies:   Patient has no known allergies.   Social History   Tobacco Use   Smoking status: Former Smoker    Packs/day: 1.00    Years: 40.00    Pack years: 40.00    Types: Cigarettes    Start date: 11/08/1960    Last attempt to quit: 08/31/2009    Years since quitting: 9.3   Smokeless tobacco: Former Systems developer    Types: Chew    Quit date: 09/01/1999  Substance Use Topics   Alcohol use: Not Currently    Alcohol/week: 0.0  standard drinks    Comment: quit in 2001   Drug use: No    Comment: back in the day used cocaine,alcohol, marijuana     Family Hx: The patient's family history includes Breast cancer in his mother. There is no history of Prostate cancer, Colon cancer, or Pancreatic cancer.  ROS:   Please see the history of present illness.     All other systems reviewed and are negative.   Prior CV studies:   The following studies were reviewed today:  Echo 05/06/2017 LV EF: 45% -   50% Study Conclusions  - Left ventricle: Diffuse hypokinesis slightly worse in inferior   wall. The cavity size was mildly dilated. Systolic function was   mildly reduced. The estimated ejection fraction was in the range   of 45% to 50%. Doppler parameters are consistent with abnormal   left ventricular relaxation (grade 1 diastolic dysfunction). - Aortic valve: Severely calcified non coronary cusp. There was   moderate regurgitation. - Mitral valve: There was mild regurgitation. - Left atrium: The atrium was mildly dilated. - Atrial septum: No defect or patent foramen ovale was  identified.  Labs/Other Tests and Data Reviewed:    EKG:  An ECG dated 07/13/2018 was personally reviewed today and demonstrated:  Normal sinus rhythm with nonspecific T wave changes.  Recent Labs: 11/11/2018: ALT 20; BUN 15; Creatinine 1.43; Hemoglobin 14.8; Platelet Count 204; Potassium 5.2; Sodium 137; TSH 2.199   Recent Lipid Panel Lab Results  Component Value Date/Time   CHOL 159 02/21/2018 08:17 AM   TRIG 40 02/21/2018 08:17 AM   HDL 71 02/21/2018 08:17 AM   CHOLHDL 2.2 02/21/2018 08:17 AM   LDLCALC 80 02/21/2018 08:17 AM    Wt Readings from Last 3 Encounters:  11/11/18 133 lb 6.4 oz (60.5 kg)  11/09/18 136 lb (61.7 kg)  10/12/18 139 lb 3.2 oz (63.1 kg)     Objective:    Vital Signs:  BP 132/60    Pulse 97    VITAL SIGNS:  reviewed  ASSESSMENT & PLAN:    1. Chest pain: Resolved.  Felt to be related to gas pain.  He is able to get around the house without any further chest discomfort.  2. Chronic systolic and diastolic heart failure: Euvolemic without any orthopnea or PND.  3. CAD: Continue aspirin and Lipitor.  4. Moderate aortic stenosis: No sign of heart failure.  COVID-19 Education: The signs and symptoms of COVID-19 were discussed with the patient and how to seek care for testing (follow up with PCP or arrange E-visit).  The importance of social distancing was discussed today.  Time:   Today, I have spent 6 minutes with the patient with telehealth technology discussing the above problems.     Medication Adjustments/Labs and Tests Ordered: Current medicines are reviewed at length with the patient today.  Concerns regarding medicines are outlined above.   Tests Ordered: No orders of the defined types were placed in this encounter.   Medication Changes: No orders of the defined types were placed in this encounter.   Disposition:  Follow up in 5 month(s)  Signed, Almyra Deforest, [place]  12/18/2018 11:45 PM    [facility]

## 2019-01-05 ENCOUNTER — Ambulatory Visit: Payer: Medicare Other | Admitting: Family Medicine

## 2019-01-05 ENCOUNTER — Other Ambulatory Visit: Payer: Self-pay

## 2019-01-28 ENCOUNTER — Other Ambulatory Visit: Payer: Self-pay | Admitting: Family Medicine

## 2019-02-08 NOTE — Progress Notes (Signed)
Per pt he is needing med refill  BP this morning was 164/78

## 2019-02-09 ENCOUNTER — Other Ambulatory Visit: Payer: Self-pay

## 2019-02-09 ENCOUNTER — Encounter: Payer: Self-pay | Admitting: Family Medicine

## 2019-02-09 ENCOUNTER — Ambulatory Visit: Payer: Medicare Other | Attending: Family Medicine | Admitting: Family Medicine

## 2019-02-09 DIAGNOSIS — N183 Chronic kidney disease, stage 3 unspecified: Secondary | ICD-10-CM

## 2019-02-09 DIAGNOSIS — C61 Malignant neoplasm of prostate: Secondary | ICD-10-CM | POA: Diagnosis not present

## 2019-02-09 DIAGNOSIS — E875 Hyperkalemia: Secondary | ICD-10-CM

## 2019-02-09 DIAGNOSIS — E7800 Pure hypercholesterolemia, unspecified: Secondary | ICD-10-CM

## 2019-02-09 MED ORDER — TAMSULOSIN HCL 0.4 MG PO CAPS: 0.4000 mg | ORAL_CAPSULE | ORAL | 6 refills | Status: DC

## 2019-02-09 MED ORDER — ATORVASTATIN CALCIUM 80 MG PO TABS: 80.0000 mg | ORAL_TABLET | Freq: Every day | ORAL | 11 refills | Status: DC

## 2019-02-09 NOTE — Progress Notes (Signed)
Virtual Visit via Telephone Note  I connected with Collin Young on 02/09/19 at  2:10 PM EDT by telephone and verified that I am speaking with the correct person using two identifiers.   I discussed the limitations, risks, security and privacy concerns of performing an evaluation and management service by telephone and the availability of in person appointments. I also discussed with the patient that there may be a patient responsible charge related to this service. The patient expressed understanding and agreed to proceed.  Patient Location: Home Provider Location: Office Others participating in call: Emilio Aspen, [place] who initiated the call to the patient  Today's visit was conducted via telephone due to limitations/restrictions on in-office visits due to the current COVID-19 pandemic  History of Present Illness:       68 year old male with hypertension and CHF who request refill of atorvastatin for continued treatment of hyperlipidemia and he reports the need for refill of medications that helps with his urinary flow.  He denies any issues with increased muscle or joint pain related to his use of atorvastatin.  He feels that the Flomax works well to help with his urination and he denies any difficulty with initiating urination and does not have to strain to initiate urination.  He believes that he gets up about once per night to urinate but sometimes sleeps throughout the night.  He was seen by cardiology in April for a telehealth visit in follow-up of chronic medical issues including some chest pain related to CAD, hypertension, and CHF.  His April cardiology visit was a follow-up of cardiology visit in March.  His last blood work was done in March at the cancer center.  Patient has history of left piriform sinus and esophagus squamous cell cancer for which he has  undergone chemotherapy and radiation in 2010 and he has ongoing follow-up with his oncologist.  He also has prostate cancer  status post brachii therapy with seed implants.  He is aware that he does have some increase in creatinine associated with chronic kidney disease.  He feels that overall he is doing well at this time.  He is taking all medications as prescribed.  He does report that his blood pressure was slightly elevated today when he checked.  He denies any headaches or dizziness related to his blood pressure.  He has had no additional issues with chest pain since his last cardiology tele-visit.  He has had no increased peripheral edema and no shortness of breath.  He feels that his CHF is stable at this time.  He has had no urinary frequency, urgency or dysuria.  He denies any difficulty with swallowing.  Past Medical History:  Diagnosis Date  . Chronic combined systolic and diastolic CHF (congestive heart failure) ([place])    followed by [name]  . COPD (chronic obstructive pulmonary disease) ([place])   . Coronary artery disease cardiologist-- [name]   per cardiac cath 01-05-2017  chronic total occlusion pLCx with collaterals, 99% severe calcified prox. to mid RCA, otherwise mild to moderate CAD (medically managed)  . Esophageal cancer, stage IIIB [facility]) oncologist-  dr ennever/  dr moody   dx 2010  SCC Stage IIIB completed chemoradiation;  localized recurrent left piriform sinus 01/ 2015,  completed concurrent chemoradiatoin 04/ 2015  . GERD (gastroesophageal reflux disease)    09-07-2018   no issues since Gtube removed 06/ 2019  . Headache   . History of alcohol abuse    quit 2001  .  History of cancer chemotherapy    2010;   2015  . History of radiation therapy    10-19-2013 to  12-05-2013 pyriform sinus 69.96 Gy/71fx;   Radiation completed 2010 for esophageal cancer  . History of seizure 2001   alcohol withdrawal  . HOH (hard of hearing)   . Hyperlipidemia   . Hypertension   . Ischemic cardiomyopathy 12/2016   01-03-2017  echo,  ef 10-15%/   echo 05-06-2017 EF improved to 45-50%  .  Lower urinary tract symptoms (LUTS)   . Prostate cancer [facility]) urologist-  dr ottelin/  oncologist-- [name]   dx 05-27-2018--- Stage T1c,  Gleason 4+3,  PSA 9.3--  scheduled for brachytherapy 09-23-2017  . Renal cyst, left   . Voice hoarseness    secondary to radiation treatment    Past Surgical History:  Procedure Laterality Date  . CARDIOVASCULAR STRESS TEST  10/09/09   normal nuclearr stress test, EF 57% [name])  . IR GASTROSTOMY TUBE MOD SED  01/13/2017  . IR GASTROSTOMY TUBE REMOVAL  02/03/2018  . IR PATIENT EVAL TECH 0-60 MINS  03/25/2017  . IR REMOVAL TUN ACCESS W/ PORT W/O FL MOD SED  01/15/2017  . IR REPLACE G-TUBE SIMPLE WO FLUORO  01/13/2018  . LARYNGOSCOPY N/A 09/15/2013   Procedure: LARYNGOSCOPY;  Surgeon: [name], MD;  Location: [place];  Service: ENT;  Laterality: N/A;  direct laryngoscopy with biopsy and esophagoscopy  . RADIOACTIVE SEED IMPLANT N/A 09/23/2018   Procedure: RADIOACTIVE SEED IMPLANT/BRACHYTHERAPY IMPLANT;  Surgeon: [name], MD;  Location: [facility];  Service: Urology;  Laterality: N/A;  . RIGHT/LEFT HEART CATH AND CORONARY ANGIOGRAPHY N/A 01/05/2017   Procedure: Right/Left Heart Cath and Coronary Angiography;  Surgeon: [name], MD;  Location: [place] CV LAB;  Service: Cardiovascular;  Laterality: N/A;  . TRANSTHORACIC ECHOCARDIOGRAM  05/06/2017   ef 45-50%,  grade 1 diastolic dysfunction/  AV severe calcified non coronary cusp with moderate regurg. , no stenosis (valve area per echo 01-05-2017 1.08cm^2)/  mild MR, TR, and PR/  mild LAE    Family History  Problem Relation Age of Onset  . Breast cancer Mother   . Prostate cancer Neg Hx   . Colon cancer Neg Hx   . Pancreatic cancer Neg Hx     Social History   Tobacco Use  . Smoking status: Former Smoker    Packs/day: 1.00    Years: 40.00    Pack years: 40.00    Types: Cigarettes    Start date: 11/08/1960    Quit date: 08/31/2009    Years since quitting: 9.4    . Smokeless tobacco: Former Systems developer    Types: Chew    Quit date: 09/01/1999  Substance Use Topics  . Alcohol use: Not Currently    Alcohol/week: 0.0 standard drinks    Comment: quit in 2001  . Drug use: No    Comment: back in the day used cocaine,alcohol, marijuana     No Known Allergies     Observations/Objective: No vital signs or physical exam conducted as visit was done via telephone  Assessment and Plan: 1. Pure hypercholesterolemia Patient was provided with refill of atorvastatin for continued treatment of hyperlipidemia.  Low-fat diet as well as exercise as tolerated recommended.  He did have CMP in March of this year and LFTs were normal. LIPITOR) 80 MG tablet; Take 1 tablet (80 mg total) by mouth daily. To lower cholesterol  Dispense: 30 tablet; Refill: 11  2. Stage 3 chronic kidney disease ([place]) Patient with stage III chronic kidney disease with most recent creatinine of 1.43 on 11/11/2018.  He is encouraged to make sure that his blood pressure remains well controlled.  He should avoid the use of nonsteroidal anti-inflammatories or other nephrotoxins.  3. Hyperkalemia Patient had mild increase in potassium at 5.2 with normal being 3.5-5.1 on blood work done 11/11/2018.  He is encouraged to try and limit potassium rich foods and he believes that he will have upcoming blood work with upcoming oncology appointment next month.  4.  Prostate cancer He denies any current urinary symptoms and feels the Flomax is working well.  Refill provided of Flomax.  Continue follow-up with urology.  Patient has had placement of radioactive seeds by radiation oncology.  He will also keep follow-up with oncology.  Follow Up Instructions:Return in about 4 months (around 06/11/2019) for Chronic medical issues.    I discussed the assessment and treatment plan with the patient. The patient was provided an opportunity to ask questions and all were answered. The patient agreed with the plan and  demonstrated an understanding of the instructions.   The patient was advised to call back or seek an in-person evaluation if the symptoms worsen or if the condition fails to improve as anticipated.  I provided 11 minutes of non-face-to-face time during this encounter.   Antony Blackbird, MD

## 2019-02-14 ENCOUNTER — Encounter: Payer: Self-pay | Admitting: Family Medicine

## 2019-02-15 ENCOUNTER — Other Ambulatory Visit: Payer: Self-pay | Admitting: *Deleted

## 2019-02-15 ENCOUNTER — Other Ambulatory Visit: Payer: Self-pay | Admitting: Family Medicine

## 2019-02-15 DIAGNOSIS — C12 Malignant neoplasm of pyriform sinus: Secondary | ICD-10-CM

## 2019-02-15 DIAGNOSIS — C61 Malignant neoplasm of prostate: Secondary | ICD-10-CM

## 2019-02-15 MED ORDER — FENTANYL 12 MCG/HR TD PT72: 1.0000 | MEDICATED_PATCH | TRANSDERMAL | 0 refills | Status: DC

## 2019-02-15 MED ORDER — TAMSULOSIN HCL 0.4 MG PO CAPS: 0.4000 mg | ORAL_CAPSULE | ORAL | 6 refills | Status: DC

## 2019-02-15 NOTE — Progress Notes (Signed)
Patient ID: Collin Young, male   DOB: [birth year], 68 y.o.   MRN: [id number]   Patient reports that he did not receive prior RX for Flomax. Medication resent and patient requested [facility] at [facility].

## 2019-02-24 ENCOUNTER — Telehealth: Payer: Self-pay | Admitting: Family Medicine

## 2019-02-24 NOTE — Telephone Encounter (Signed)
New Message  Pt came in to drop of Handicap form. Placed in Fulp's box

## 2019-02-24 NOTE — Telephone Encounter (Signed)
Spoke with patient and informed him that provided did receive his handicap form and it is completed. Informed patient that completed form is ready for pickup. Patient verbalized understanding.

## 2019-03-08 ENCOUNTER — Other Ambulatory Visit: Payer: Self-pay

## 2019-03-08 ENCOUNTER — Ambulatory Visit: Payer: Medicare Other | Attending: Family Medicine | Admitting: Family Medicine

## 2019-03-08 ENCOUNTER — Encounter: Payer: Self-pay | Admitting: Family Medicine

## 2019-03-08 DIAGNOSIS — R109 Unspecified abdominal pain: Secondary | ICD-10-CM | POA: Diagnosis not present

## 2019-03-08 DIAGNOSIS — R10A1 Flank pain, right side: Secondary | ICD-10-CM

## 2019-03-08 DIAGNOSIS — C61 Malignant neoplasm of prostate: Secondary | ICD-10-CM

## 2019-03-08 DIAGNOSIS — R350 Frequency of micturition: Secondary | ICD-10-CM

## 2019-03-08 MED ORDER — SULFAMETHOXAZOLE-TRIMETHOPRIM 800-160 MG PO TABS: 1.0000 | ORAL_TABLET | Freq: Two times a day (BID) | ORAL | 0 refills | Status: AC

## 2019-03-08 NOTE — Progress Notes (Signed)
Virtual Visit via Telephone Note  I connected with  on 03/08/19 at 11:10 AM EDT by telephone and verified that I am speaking with the correct person using two identifiers.   I discussed the limitations, risks, security and privacy concerns of performing an evaluation and management service by telephone and the availability of in person appointments. I also discussed with the patient that there may be a patient responsible charge related to this service. The patient expressed understanding and agreed to proceed.  Patient Location: Home Provider Location: Office Others participating in call: Call initiated by Emilio Aspen, RMA   History of Present Illness:       68 year old male with prostate cancer for which he is currently receiving brachii therapy who complains of approximately 1 month of right flank pain which is dull and about a 5 on a 0-to-10 scale.  He is also developed increased urinary frequency and sometimes feels as if he has difficulty initiating his urinary stream.  Patient sometimes urinates and then has to go again soon afterward.  He denies any current fever or chills.  He believes that he may have a urinary tract infection.  Flank pain is not constant but comes and goes.  No nausea, vomiting, diarrhea or constipation.    Past Medical History:  Diagnosis Date  . Chronic combined systolic and diastolic CHF (congestive heart failure) ([place])    followed by [name]  . COPD (chronic obstructive pulmonary disease) ([place])   . Coronary artery disease cardiologist-- [name]   per cardiac cath 01-05-2017  chronic total occlusion pLCx with collaterals, 99% severe calcified prox. to mid RCA, otherwise mild to moderate CAD (medically managed)  . Esophageal cancer, stage IIIB [facility]) oncologist-  dr ennever/  dr moody   dx 2010  SCC Stage IIIB completed chemoradiation;  localized recurrent left piriform sinus 01/ 2015,  completed concurrent chemoradiatoin 04/ 2015  .  GERD (gastroesophageal reflux disease)    09-07-2018   no issues since Gtube removed 06/ 2019  . Headache   . History of alcohol abuse    quit 2001  . History of cancer chemotherapy    2010;   2015  . History of radiation therapy    10-19-2013 to  12-05-2013 pyriform sinus 69.96 Gy/62fx;   Radiation completed 2010 for esophageal cancer  . History of seizure 2001   alcohol withdrawal  . HOH (hard of hearing)   . Hyperlipidemia   . Hypertension   . Ischemic cardiomyopathy 12/2016   01-03-2017  echo,  ef 10-15%/   echo 05-06-2017 EF improved to 45-50%  . Lower urinary tract symptoms (LUTS)   . Prostate cancer [facility]) urologist-  dr ottelin/  oncologist-- [name]   dx 05-27-2018--- Stage T1c,  Gleason 4+3,  PSA 9.3--  scheduled for brachytherapy 09-23-2017  . Renal cyst, left   . Voice hoarseness    secondary to radiation treatment    Past Surgical History:  Procedure Laterality Date  . CARDIOVASCULAR STRESS TEST  10/09/09   normal nuclearr stress test, EF 57% [name])  . IR GASTROSTOMY TUBE MOD SED  01/13/2017  . IR GASTROSTOMY TUBE REMOVAL  02/03/2018  . IR PATIENT EVAL TECH 0-60 MINS  03/25/2017  . IR REMOVAL TUN ACCESS W/ PORT W/O FL MOD SED  01/15/2017  . IR REPLACE G-TUBE SIMPLE WO FLUORO  01/13/2018  . LARYNGOSCOPY N/A 09/15/2013   Procedure: LARYNGOSCOPY;  Surgeon: [name], MD;  Location: [place];  Service:  ENT;  Laterality: N/A;  direct laryngoscopy with biopsy and esophagoscopy  . RADIOACTIVE SEED IMPLANT N/A 09/23/2018   Procedure: RADIOACTIVE SEED IMPLANT/BRACHYTHERAPY IMPLANT;  Surgeon: [name], MD;  Location: [facility];  Service: Urology;  Laterality: N/A;  . RIGHT/LEFT HEART CATH AND CORONARY ANGIOGRAPHY N/A 01/05/2017   Procedure: Right/Left Heart Cath and Coronary Angiography;  Surgeon: [name], MD;  Location: [place] CV LAB;  Service: Cardiovascular;  Laterality: N/A;  . TRANSTHORACIC ECHOCARDIOGRAM  05/06/2017   ef 45-50%,   grade 1 diastolic dysfunction/  AV severe calcified non coronary cusp with moderate regurg. , no stenosis (valve area per echo 01-05-2017 1.08cm^2)/  mild MR, TR, and PR/  mild LAE    Family History  Problem Relation Age of Onset  . Breast cancer Mother   . Prostate cancer Neg Hx   . Colon cancer Neg Hx   . Pancreatic cancer Neg Hx     Social History   Tobacco Use  . Smoking status: Former Smoker    Packs/day: 1.00    Years: 40.00    Pack years: 40.00    Types: Cigarettes    Start date: 11/08/1960    Quit date: 08/31/2009    Years since quitting: 9.5  . Smokeless tobacco: Former Systems developer    Types: Chew    Quit date: 09/01/1999  Substance Use Topics  . Alcohol use: Not Currently    Alcohol/week: 0.0 standard drinks    Comment: quit in 2001  . Drug use: No    Comment: back in the day used cocaine,alcohol, marijuana     No Known Allergies     Observations/Objective: No vital signs or physical exam conducted as visit was done via telephone  Assessment and Plan: 1. Urinary frequency 2. Prostate cancer ([place]) 3. Right flank pain Discussed with the patient that I believe that he has urinary tract infection versus prostatitis.  Patient will come into the office for lab only visit for urinalysis and urine culture.  He believes that he may be able to come in tomorrow afternoon.  Prescription will be sent to patient's pharmacy to start Septra double strength while urine culture is pending.  If he continues to have flank pain, develops abdominal pain, fever, chills or any concerns then he may need to go to urgent care/ED during nonoffice hours or otherwise schedule an office follow-up visit.  He will also be notified if a change is needed in antibiotic therapy based on the culture results - Urine Culture; Future  Follow Up Instructions: Return in 1 to 2 weeks if current symptoms do not resolve    I discussed the assessment and treatment plan with the patient. The patient was provided an  opportunity to ask questions and all were answered. The patient agreed with the plan and demonstrated an understanding of the instructions.   The patient was advised to call back or seek an in-person evaluation if the symptoms worsen or if the condition fails to improve as anticipated.  I provided 7 minutes of non-face-to-face time during this encounter.   Antony Blackbird, MD

## 2019-03-08 NOTE — Progress Notes (Signed)
Right abd pain and frequent urination

## 2019-03-09 ENCOUNTER — Other Ambulatory Visit: Payer: Self-pay

## 2019-03-09 ENCOUNTER — Ambulatory Visit: Payer: Medicare Other | Attending: Family Medicine

## 2019-03-09 DIAGNOSIS — C61 Malignant neoplasm of prostate: Secondary | ICD-10-CM

## 2019-03-09 DIAGNOSIS — R109 Unspecified abdominal pain: Secondary | ICD-10-CM

## 2019-03-09 DIAGNOSIS — R350 Frequency of micturition: Secondary | ICD-10-CM

## 2019-03-11 LAB — URINE CULTURE: Organism ID, Bacteria: NO GROWTH

## 2019-03-13 ENCOUNTER — Telehealth: Payer: Self-pay | Admitting: Emergency Medicine

## 2019-03-13 NOTE — Telephone Encounter (Signed)
Nurse called the patient's home phone number but received no answer and message was left on the voicemail for the patient to call back.  Return phone number given. 

## 2019-03-16 ENCOUNTER — Inpatient Hospital Stay: Payer: Medicare Other | Attending: Hematology & Oncology | Admitting: Hematology & Oncology

## 2019-03-16 ENCOUNTER — Inpatient Hospital Stay: Payer: Medicare Other

## 2019-03-16 ENCOUNTER — Other Ambulatory Visit: Payer: Self-pay

## 2019-03-16 ENCOUNTER — Encounter: Payer: Self-pay | Admitting: Hematology & Oncology

## 2019-03-16 VITALS — BP 136/67 | HR 70 | Temp 97.7°F | Resp 18 | Wt 127.0 lb

## 2019-03-16 DIAGNOSIS — C12 Malignant neoplasm of pyriform sinus: Secondary | ICD-10-CM

## 2019-03-16 DIAGNOSIS — R109 Unspecified abdominal pain: Secondary | ICD-10-CM | POA: Diagnosis not present

## 2019-03-16 DIAGNOSIS — C61 Malignant neoplasm of prostate: Secondary | ICD-10-CM | POA: Diagnosis present

## 2019-03-16 LAB — CBC WITH DIFFERENTIAL (CANCER CENTER ONLY)
Abs Immature Granulocytes: 0.01 10*3/uL (ref 0.00–0.07)
Basophils Absolute: 0 10*3/uL (ref 0.0–0.1)
Basophils Relative: 1 %
Eosinophils Absolute: 0.1 10*3/uL (ref 0.0–0.5)
Eosinophils Relative: 2 %
HCT: 40.1 % (ref 39.0–52.0)
Hemoglobin: 12.5 g/dL — ABNORMAL LOW (ref 13.0–17.0)
Immature Granulocytes: 0 %
Lymphocytes Relative: 22 %
Lymphs Abs: 0.8 10*3/uL (ref 0.7–4.0)
MCH: 28 pg (ref 26.0–34.0)
MCHC: 31.2 g/dL (ref 30.0–36.0)
MCV: 89.9 fL (ref 80.0–100.0)
Monocytes Absolute: 0.6 10*3/uL (ref 0.1–1.0)
Monocytes Relative: 16 %
Neutro Abs: 2.3 10*3/uL (ref 1.7–7.7)
Neutrophils Relative %: 59 %
Platelet Count: 193 10*3/uL (ref 150–400)
RBC: 4.46 MIL/uL (ref 4.22–5.81)
RDW: 13.6 % (ref 11.5–15.5)
WBC Count: 3.7 10*3/uL — ABNORMAL LOW (ref 4.0–10.5)
nRBC: 0 % (ref 0.0–0.2)

## 2019-03-16 LAB — CMP (CANCER CENTER ONLY)
ALT: 29 U/L (ref 0–44)
AST: 39 U/L (ref 15–41)
Albumin: 4.1 g/dL (ref 3.5–5.0)
Alkaline Phosphatase: 45 U/L (ref 38–126)
Anion gap: 6 (ref 5–15)
BUN: 25 mg/dL — ABNORMAL HIGH (ref 8–23)
CO2: 29 mmol/L (ref 22–32)
Calcium: 8.8 mg/dL — ABNORMAL LOW (ref 8.9–10.3)
Chloride: 99 mmol/L (ref 98–111)
Creatinine: 1.63 mg/dL — ABNORMAL HIGH (ref 0.61–1.24)
GFR, Est AFR Am: 49 mL/min — ABNORMAL LOW (ref >60)
GFR, Estimated: 43 mL/min — ABNORMAL LOW (ref >60)
Glucose, Bld: 110 mg/dL — ABNORMAL HIGH (ref 70–99)
Potassium: 4.9 mmol/L (ref 3.5–5.1)
Sodium: 134 mmol/L — ABNORMAL LOW (ref 135–145)
Total Bilirubin: 0.4 mg/dL (ref 0.3–1.2)
Total Protein: 7 g/dL (ref 6.5–8.1)

## 2019-03-16 NOTE — Progress Notes (Signed)
Hematology and Oncology Follow Up Visit  Collin Young [id number] 11-20-1950 68 y.o. 03/16/2019   Principle Diagnosis:  Stage II (T2 N0M0) squamous cell carcinoma of the left piriform sinus Stage IIIB squamous cell carcinoma of the esophagus-remission Congestive heart failure Stage T1c prostate cancer  Current Therapy:    Status post prostate brachytherapy      Interim History:  Mr.  Young is back for followup.  He is doing pretty well.Marland Kitchen  He is doing a little bit of work outside.  He has occasional pain down the right abdomen.  I am not sure this is from the past radiation that he took for the prostate cancer.  He is having no problems with bowels or bladder.  He has no cough.  He is swallowing well.  He is eating okay.  There is no dysphasia or odynophagia.  He says that he does not really use the Duragesic patches all that much.  He has a pain flareup, that he puts one on and this helps him.  He has had no bleeding.  Overall, his performance status is ECOG 1.    Medications:  Current Outpatient Medications:  .  aspirin EC 81 MG tablet, Take 81 mg by mouth daily., Disp: , Rfl:  .  atorvastatin (LIPITOR) 80 MG tablet, Take 1 tablet (80 mg total) by mouth daily. To lower cholesterol, Disp: 30 tablet, Rfl: 11 .  doxycycline (VIBRA-TABS) 100 MG tablet, Take 1 tablet (100 mg total) by mouth 2 (two) times daily. (Patient taking differently: Take 100 mg by mouth daily. ), Disp: 20 tablet, Rfl: 0 .  fentaNYL (DURAGESIC) 12 MCG/HR, Place 1 patch onto the skin every 3 (three) days., Disp: 10 patch, Rfl: 0 .  HYDROcodone-acetaminophen (NORCO) 10-325 MG tablet, Take 1-2 tablets by mouth every 4 (four) hours as needed for moderate pain. Maximum dose per 24 hours - 8 pills, Disp: 8 tablet, Rfl: 0 .  metoprolol succinate (TOPROL XL) 25 MG 24 hr tablet, 1/2 TABLET DAILY (Patient taking differently: Take 12.5 mg by mouth daily. ), Disp: 15 tablet, Rfl: 5 .  omeprazole (PRILOSEC) 20 MG  capsule, Take 1 capsule (20 mg total) by mouth daily. To reduce stomach acid, stomach protection, Disp: 30 capsule, Rfl: 11 .  sacubitril-valsartan (ENTRESTO) 24-26 MG, Take 1 tablet by mouth 2 (two) times daily. (Patient taking differently: Take 1 tablet by mouth daily. ), Disp: 180 tablet, Rfl: 3 .  tamsulosin (FLOMAX) 0.4 MG CAPS capsule, Take 1 capsule (0.4 mg total) by mouth daily after supper., Disp: 30 capsule, Rfl: 6 No current facility-administered medications for this visit.   Facility-Administered Medications Ordered in Other Visits:  .  topical emolient (BIAFINE) emulsion, , Topical, Daily, Kyung Rudd, MD  Allergies: No Known Allergies  Past Medical History, Surgical history, Social history, and Family History were reviewed and updated.  Review of Systems: Review of Systems  Constitutional: Negative.   HENT: Negative.   Eyes: Negative.   Respiratory: Negative.   Cardiovascular: Negative.   Gastrointestinal: Negative.   Genitourinary: Negative.   Musculoskeletal: Negative.   Skin: Negative.   Neurological: Negative.   Endo/Heme/Allergies: Negative.   Psychiatric/Behavioral: Negative.      Physical Exam:  weight is 127 lb (57.6 kg). His oral temperature is 97.7 F (36.5 C). His blood pressure is 136/67 and his pulse is 70. His respiration is 18 and oxygen saturation is 100%.   Physical Exam Vitals signs reviewed.  HENT:     Head: Normocephalic and  atraumatic.  Eyes:     Pupils: Pupils are equal, round, and reactive to light.  Neck:     Musculoskeletal: Normal range of motion.  Cardiovascular:     Rate and Rhythm: Normal rate and regular rhythm.     Heart sounds: Normal heart sounds.  Pulmonary:     Effort: Pulmonary effort is normal.     Breath sounds: Normal breath sounds.  Abdominal:     General: Bowel sounds are normal.     Palpations: Abdomen is soft.     Comments: He has a feeding tube in place.  There is no erythema or exudate from the feeding tube  entry site.  Musculoskeletal: Normal range of motion.        General: No tenderness or deformity.  Lymphadenopathy:     Cervical: No cervical adenopathy.  Skin:    General: Skin is warm and dry.     Findings: No erythema or rash.  Neurological:     Mental Status: He is alert and oriented to person, place, and time.  Psychiatric:        Behavior: Behavior normal.        Thought Content: Thought content normal.        Judgment: Judgment normal.     Lab Results  Component Value Date   WBC 3.7 (L) 03/16/2019   HGB 12.5 (L) 03/16/2019   HCT 40.1 03/16/2019   MCV 89.9 03/16/2019   PLT 193 03/16/2019     Chemistry      Component Value Date/Time   NA 134 (L) 03/16/2019 0855   NA 139 10/05/2018 1112   NA 139 07/30/2017 0926   NA 137 03/10/2017 1000   K 4.9 03/16/2019 0855   K 3.9 07/30/2017 0926   K 4.3 03/10/2017 1000   CL 99 03/16/2019 0855   CL 96 (L) 07/30/2017 0926   CO2 29 03/16/2019 0855   CO2 33 07/30/2017 0926   CO2 29 03/10/2017 1000   BUN 25 (H) 03/16/2019 0855   BUN 19 10/05/2018 1112   BUN 17 07/30/2017 0926   BUN 14.7 03/10/2017 1000   CREATININE 1.63 (H) 03/16/2019 0855   CREATININE 1.4 (H) 07/30/2017 0926   CREATININE 1.1 03/10/2017 1000      Component Value Date/Time   CALCIUM 8.8 (L) 03/16/2019 0855   CALCIUM 9.6 07/30/2017 0926   CALCIUM 10.0 03/10/2017 1000   ALKPHOS 45 03/16/2019 0855   ALKPHOS 49 07/30/2017 0926   ALKPHOS 51 03/10/2017 1000   AST 39 03/16/2019 0855   AST 23 03/10/2017 1000   ALT 29 03/16/2019 0855   ALT 31 07/30/2017 0926   ALT 18 03/10/2017 1000   BILITOT 0.4 03/16/2019 0855   BILITOT 0.41 03/10/2017 1000         Impression and Plan: Collin Young is 68 year old gentleman. He had a localized recurrence of carcinoma of the left pyriform sinus. He underwent concurrent chemotherapy and radiation therapy. He finished his treatment back in April of 2015.   I would like to see him back in 3 months.  I will be troubled by  the rise in his creatinine.  I told him to make sure that he drink a lot of fluid.  This I think is critical.  If his creatinine is higher we see him back, that he may need to go back to his family doctor to have this looked at.   [name], MD 7/16/202010:25 AM

## 2019-03-20 ENCOUNTER — Other Ambulatory Visit: Payer: Self-pay | Admitting: *Deleted

## 2019-03-20 DIAGNOSIS — C12 Malignant neoplasm of pyriform sinus: Secondary | ICD-10-CM

## 2019-03-20 DIAGNOSIS — C61 Malignant neoplasm of prostate: Secondary | ICD-10-CM

## 2019-03-20 MED ORDER — FENTANYL 12 MCG/HR TD PT72: 1.0000 | MEDICATED_PATCH | TRANSDERMAL | 0 refills | Status: DC

## 2019-03-21 ENCOUNTER — Telehealth: Payer: Self-pay | Admitting: Emergency Medicine

## 2019-03-21 NOTE — Telephone Encounter (Signed)
Patient contacted via phone to be given results of labs.  Patient identified by name and date of birth.  Patient given results of labs.  Patient educated on lab results. Questions answered. Patient acknowledged understanding of labs results.  Patient advised to contact Urology.  Patient acknowledged understanding of advice.

## 2019-05-04 ENCOUNTER — Other Ambulatory Visit: Payer: Self-pay | Admitting: Cardiovascular Disease

## 2019-05-05 ENCOUNTER — Other Ambulatory Visit: Payer: Self-pay | Admitting: Cardiovascular Disease

## 2019-05-22 ENCOUNTER — Ambulatory Visit: Payer: Medicare Other | Admitting: Pharmacist

## 2019-05-22 ENCOUNTER — Other Ambulatory Visit: Payer: Self-pay

## 2019-05-23 ENCOUNTER — Ambulatory Visit: Payer: Medicare Other | Attending: Family Medicine | Admitting: Pharmacist

## 2019-05-23 ENCOUNTER — Other Ambulatory Visit: Payer: Self-pay

## 2019-05-23 DIAGNOSIS — Z23 Encounter for immunization: Secondary | ICD-10-CM

## 2019-05-23 NOTE — Progress Notes (Signed)
Patient presents for vaccination against influenza per orders of Dr. Fulp. Consent given. Counseling provided. No contraindications exists. Vaccine administered without incident.   

## 2019-05-25 ENCOUNTER — Other Ambulatory Visit: Payer: Self-pay | Admitting: Cardiovascular Disease

## 2019-06-08 ENCOUNTER — Other Ambulatory Visit: Payer: Self-pay | Admitting: Cardiovascular Disease

## 2019-06-08 ENCOUNTER — Telehealth: Payer: Self-pay | Admitting: Cardiovascular Disease

## 2019-06-08 NOTE — Telephone Encounter (Signed)
Rx request sent to pharmacy.  

## 2019-06-08 NOTE — Telephone Encounter (Signed)
Pt overdue for 5 month f/u. Please contact pt for future appointment.

## 2019-06-08 NOTE — Telephone Encounter (Signed)
LVM for patient to call and schedule 5 month followup.

## 2019-06-08 NOTE — Telephone Encounter (Signed)
Pt due for 5 month fu. Please contact pt for future appointment.

## 2019-06-15 ENCOUNTER — Other Ambulatory Visit: Payer: Medicare Other

## 2019-06-15 ENCOUNTER — Ambulatory Visit: Payer: Medicare Other | Admitting: Hematology & Oncology

## 2019-06-19 ENCOUNTER — Other Ambulatory Visit: Payer: Self-pay

## 2019-06-19 ENCOUNTER — Encounter: Payer: Self-pay | Admitting: Family

## 2019-06-19 ENCOUNTER — Telehealth: Payer: Self-pay | Admitting: Family

## 2019-06-19 ENCOUNTER — Inpatient Hospital Stay: Payer: Medicare Other | Attending: Family

## 2019-06-19 ENCOUNTER — Inpatient Hospital Stay (HOSPITAL_BASED_OUTPATIENT_CLINIC_OR_DEPARTMENT_OTHER): Payer: Medicare Other | Admitting: Family

## 2019-06-19 VITALS — BP 141/74 | HR 85 | Temp 97.8°F | Resp 17 | Wt 131.1 lb

## 2019-06-19 DIAGNOSIS — C12 Malignant neoplasm of pyriform sinus: Secondary | ICD-10-CM | POA: Insufficient documentation

## 2019-06-19 DIAGNOSIS — Z923 Personal history of irradiation: Secondary | ICD-10-CM | POA: Diagnosis not present

## 2019-06-19 DIAGNOSIS — C61 Malignant neoplasm of prostate: Secondary | ICD-10-CM

## 2019-06-19 LAB — CMP (CANCER CENTER ONLY)
ALT: 25 U/L (ref 0–44)
AST: 31 U/L (ref 15–41)
Albumin: 4.4 g/dL (ref 3.5–5.0)
Alkaline Phosphatase: 39 U/L (ref 38–126)
Anion gap: 5 (ref 5–15)
BUN: 20 mg/dL (ref 8–23)
CO2: 32 mmol/L (ref 22–32)
Calcium: 9.6 mg/dL (ref 8.9–10.3)
Chloride: 98 mmol/L (ref 98–111)
Creatinine: 1.39 mg/dL — ABNORMAL HIGH (ref 0.61–1.24)
GFR, Est AFR Am: 60 mL/min — ABNORMAL LOW (ref >60)
GFR, Estimated: 52 mL/min — ABNORMAL LOW (ref >60)
Glucose, Bld: 113 mg/dL — ABNORMAL HIGH (ref 70–99)
Potassium: 4.5 mmol/L (ref 3.5–5.1)
Sodium: 135 mmol/L (ref 135–145)
Total Bilirubin: 0.5 mg/dL (ref 0.3–1.2)
Total Protein: 7.3 g/dL (ref 6.5–8.1)

## 2019-06-19 LAB — CBC WITH DIFFERENTIAL (CANCER CENTER ONLY)
Abs Immature Granulocytes: 0 10*3/uL (ref 0.00–0.07)
Basophils Absolute: 0 10*3/uL (ref 0.0–0.1)
Basophils Relative: 1 %
Eosinophils Absolute: 0.1 10*3/uL (ref 0.0–0.5)
Eosinophils Relative: 2 %
HCT: 43.2 % (ref 39.0–52.0)
Hemoglobin: 13.6 g/dL (ref 13.0–17.0)
Immature Granulocytes: 0 %
Lymphocytes Relative: 30 %
Lymphs Abs: 1.3 10*3/uL (ref 0.7–4.0)
MCH: 28.5 pg (ref 26.0–34.0)
MCHC: 31.5 g/dL (ref 30.0–36.0)
MCV: 90.6 fL (ref 80.0–100.0)
Monocytes Absolute: 0.8 10*3/uL (ref 0.1–1.0)
Monocytes Relative: 19 %
Neutro Abs: 2 10*3/uL (ref 1.7–7.7)
Neutrophils Relative %: 48 %
Platelet Count: 174 10*3/uL (ref 150–400)
RBC: 4.77 MIL/uL (ref 4.22–5.81)
RDW: 13.8 % (ref 11.5–15.5)
WBC Count: 4.2 10*3/uL (ref 4.0–10.5)
nRBC: 0 % (ref 0.0–0.2)

## 2019-06-19 MED ORDER — FENTANYL 12 MCG/HR TD PT72: 1.0000 | MEDICATED_PATCH | TRANSDERMAL | 0 refills | Status: DC

## 2019-06-19 NOTE — Telephone Encounter (Signed)
Called and advised patient regarding appointments added   / letter/calendar mailed per 10/19 los

## 2019-06-19 NOTE — Progress Notes (Signed)
Hematology and Oncology Follow Up Visit  REDRICK NORDAHL [id number] 1951-07-02 68 y.o. 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 Brouse is here today for follow-up. He is doing quite well and has no complaints at this time.  He states that his neck pain is much better. He uses his fentanyl patch as needed for pain flares.  No problems swallowing. He states that he is eating well and staying hydrated. His weight is stable.  He denies any bleeding. No bruising or petechiae.  No fever, chills, n/v, cough, rash, SOB, chest pain, palpitations, abdominal pain or changes in bowel or bladder habits.  He had an episode of dizziness that lasted only a few seconds recently but has had no other episodes.  No swelling, tenderness, numbness or tingling in his extremities.  No falls or syncope.   ECOG Performance Status: 1 - Symptomatic but completely ambulatory  Medications:  Allergies as of 06/19/2019   No Known Allergies     Medication List       Accurate as of June 19, 2019 12:19 PM. If you have any questions, ask your nurse or doctor.        aspirin EC 81 MG tablet Take 81 mg by mouth daily.   atorvastatin 80 MG tablet Commonly known as: LIPITOR Take 1 tablet (80 mg total) by mouth daily. To lower cholesterol   doxycycline 100 MG tablet Commonly known as: VIBRA-TABS Take 1 tablet (100 mg total) by mouth 2 (two) times daily. What changed: when to take this   Entresto 24-26 MG Generic drug: sacubitril-valsartan Take 1 tablet by mouth 2 (two) times daily.   fentaNYL 12 MCG/HR Commonly known as: [place] 1 patch onto the skin every 3 (three) days.   HYDROcodone-acetaminophen 10-325 MG tablet Commonly known  as: NORCO Take 1-2 tablets by mouth every 4 (four) hours as needed for moderate pain. Maximum dose per 24 hours - 8 pills   metoprolol succinate 25 MG 24 hr tablet Commonly known as: TOPROL-XL TAKE 1/2 TABLET BY MOUTH DAILY. FOLLOW-UP APPOINTMENT*   omeprazole 20 MG capsule Commonly known as: PRILOSEC Take 1 capsule (20 mg total) by mouth daily. To reduce stomach acid, stomach protection   tamsulosin 0.4 MG Caps capsule Commonly known as: FLOMAX Take 1 capsule (0.4 mg total) by mouth daily after supper.       Allergies: No Known Allergies  Past Medical History, Surgical history, Social history, and Family History were reviewed and updated.  Review of Systems: All other 10 point review of systems is negative.   Physical Exam:  weight is 131 lb 1.9 oz (59.5 kg). His temporal temperature is 97.8 F (36.6 C). His blood pressure is 141/74 (abnormal) and his pulse is 85. His respiration is 17 and oxygen saturation is 100%.   Wt Readings from Last 3 Encounters:  06/19/19 131 lb 1.9 oz (59.5 kg)  03/16/19 127 lb (57.6 kg)  11/11/18 133 lb 6.4 oz (60.5 kg)    Ocular: Sclerae unicteric, pupils equal, round and reactive to light Ear-nose-throat: Oropharynx clear, dentition fair Lymphatic: No cervical or supraclavicular adenopathy Lungs no rales or rhonchi, good excursion bilaterally Heart regular rate and rhythm, no murmur appreciated Abd soft, nontender, positive bowel sounds MSK no focal spinal  tenderness, no joint edema, no liver or spleen tip palpated on exam, no fluid wave  Neuro: non-focal, well-oriented, appropriate affect Breasts: Deferred   Lab Results  Component Value Date   WBC 4.2 06/19/2019   HGB 13.6 06/19/2019   HCT 43.2 06/19/2019   MCV 90.6 06/19/2019   PLT 174 06/19/2019   Lab Results  Component Value Date   FERRITIN 172 06/18/2017   IRON 84 06/18/2017   TIBC 291 06/18/2017   UIBC 207 06/18/2017   IRONPCTSAT 29 06/18/2017   Lab Results  Component  Value Date   RBC 4.77 06/19/2019   No results found for: KPAFRELGTCHN, LAMBDASER, KAPLAMBRATIO No results found for: Kandis Cocking, IGMSERUM No results found for: Odetta Pink, SPEI   Chemistry      Component Value Date/Time   NA 135 06/19/2019 1136   NA 139 10/05/2018 1112   NA 139 07/30/2017 0926   NA 137 03/10/2017 1000   K 4.5 06/19/2019 1136   K 3.9 07/30/2017 0926   K 4.3 03/10/2017 1000   CL 98 06/19/2019 1136   CL 96 (L) 07/30/2017 0926   CO2 32 06/19/2019 1136   CO2 33 07/30/2017 0926   CO2 29 03/10/2017 1000   BUN 20 06/19/2019 1136   BUN 19 10/05/2018 1112   BUN 17 07/30/2017 0926   BUN 14.7 03/10/2017 1000   CREATININE 1.39 (H) 06/19/2019 1136   CREATININE 1.4 (H) 07/30/2017 0926   CREATININE 1.1 03/10/2017 1000      Component Value Date/Time   CALCIUM 9.6 06/19/2019 1136   CALCIUM 9.6 07/30/2017 0926   CALCIUM 10.0 03/10/2017 1000   ALKPHOS 39 06/19/2019 1136   ALKPHOS 49 07/30/2017 0926   ALKPHOS 51 03/10/2017 1000   AST 31 06/19/2019 1136   AST 23 03/10/2017 1000   ALT 25 06/19/2019 1136   ALT 31 07/30/2017 0926   ALT 18 03/10/2017 1000   BILITOT 0.5 06/19/2019 1136   BILITOT 0.41 03/10/2017 1000       Impression and Plan: Mr. Drakes is a very pleasant 68 yo gentleman with history of localized recurrence of carcinoma or the left pyriform sinus with concurrent chemotherapy and radiation completed in April 2015.  He continues to do well and has no complaints at this time. His creatinine level is a little improved at 1.39, BUN 20.  We will continue to follow along with him and plan to see him back in another 4 months.  He will contact our office with any questions or concerns. We can certainly see him sooner if needed.   Laverna Peace, NP 10/19/202012:19 PM

## 2019-07-05 ENCOUNTER — Encounter: Payer: Self-pay | Admitting: Cardiology

## 2019-07-05 ENCOUNTER — Other Ambulatory Visit: Payer: Self-pay

## 2019-07-05 ENCOUNTER — Ambulatory Visit (INDEPENDENT_AMBULATORY_CARE_PROVIDER_SITE_OTHER): Payer: Medicare Other | Admitting: Cardiology

## 2019-07-05 DIAGNOSIS — C61 Malignant neoplasm of prostate: Secondary | ICD-10-CM | POA: Diagnosis not present

## 2019-07-05 DIAGNOSIS — N183 Chronic kidney disease, stage 3 unspecified: Secondary | ICD-10-CM

## 2019-07-05 DIAGNOSIS — I2510 Atherosclerotic heart disease of native coronary artery without angina pectoris: Secondary | ICD-10-CM | POA: Insufficient documentation

## 2019-07-05 DIAGNOSIS — C12 Malignant neoplasm of pyriform sinus: Secondary | ICD-10-CM | POA: Diagnosis not present

## 2019-07-05 DIAGNOSIS — E7800 Pure hypercholesterolemia, unspecified: Secondary | ICD-10-CM

## 2019-07-05 DIAGNOSIS — I420 Dilated cardiomyopathy: Secondary | ICD-10-CM | POA: Diagnosis not present

## 2019-07-05 DIAGNOSIS — N1831 Chronic kidney disease, stage 3a: Secondary | ICD-10-CM | POA: Insufficient documentation

## 2019-07-05 MED ORDER — METOPROLOL SUCCINATE ER 25 MG PO TB24: 12.5000 mg | ORAL_TABLET | Freq: Every day | ORAL | 3 refills | Status: DC

## 2019-07-05 NOTE — Assessment & Plan Note (Signed)
High grade pRCA disease and CTO of CFX at cath May 2018- no PCI secondary to CRI and acute illness.  His EF did recover without intervention.  

## 2019-07-05 NOTE — Assessment & Plan Note (Signed)
S/P seed implant Jan 2020 

## 2019-07-05 NOTE — Assessment & Plan Note (Signed)
SCr 1.4- GFR 51

## 2019-07-05 NOTE — Assessment & Plan Note (Signed)
Required tube feeding till June 2019 

## 2019-07-05 NOTE — Patient Instructions (Signed)
Medication Instructions:  Your physician recommends that you continue on your current medications as directed. Please refer to the Current Medication list given to you today. *If you need a refill on your cardiac medications before your next appointment, please call your pharmacy*  Lab Work: None  If you have labs (blood work) drawn today and your tests are completely normal, you will receive your results only by: Marland Kitchen MyChart Message (if you have MyChart) OR . A paper copy in the mail If you have any lab test that is abnormal or we need to change your treatment, we will call you to review the results.  Testing/Procedures: None   Follow-Up: At [facility], you and your health needs are our priority.  As part of our continuing mission to provide you with exceptional heart care, we have created designated Provider Care Teams.  These Care Teams include your primary Cardiologist (physician) and Advanced Practice Providers (APPs -  Physician Assistants and Nurse Practitioners) who all work together to provide you with the care you need, when you need it.  Your next appointment:   6 months  The format for your next appointment:   In Person  Provider:   Kerin Ransom, PA-C  Other Instructions

## 2019-07-05 NOTE — Assessment & Plan Note (Signed)
EF 10-15% by echo May 2018- improved to 45-50% by echo Sept 2018 

## 2019-07-05 NOTE — Progress Notes (Signed)
Cardiology Office Note:    Date:  07/05/2019   ID:  Collin Young, DOB August 14, 1951, MRN [id number]  PCP:  Antony Blackbird, MD  Cardiologist:  Skeet Latch, MD  Electrophysiologist:  None   Referring MD: Antony Blackbird, MD   No chief complaint on file.   History of Present Illness:    Collin Young is a remarkable 68 y.o.AA male with a hx of squamous cell carcinoma of the left puriform sinus and squamous cell carcinoma of the esophagus. He is s/p chemotherapy and radiation 2015 (remission). He was admitted 01/02/17 with shortness of breath and found to have a BNP of 2k and an EF of 10-15% by echo. In addition he had acute on chronic renal insufficiency.  He eventually underwent coronary angiogram which revealed severe 2V CAD- CTO of CFX and high grade RCA.  He was felt to be too ill for CABG.  PCI was an option if his renal function recovered and he was symptomatic.  Fortunately the patient has done well from a cardiac standpoint. His EF improved to 45-50% by echo Sept 2018.  He has been back to work Soil scientist.  He was diagnosed with prostate cancer in Dec 2019 and had seed implant Jan 2020.    He is in the office today for follow up.  He continues to do well, no chest pain.  He did mention Rt sided neck pain but it did not sound like angina.  He has had some chronic pain after her radiation Rx.   Past Medical History:  Diagnosis Date  . Chronic combined systolic and diastolic CHF (congestive heart failure) ([place])    followed by [name]  . COPD (chronic obstructive pulmonary disease) ([place])   . Coronary artery disease cardiologist-- [name]   per cardiac cath 01-05-2017  chronic total occlusion pLCx with collaterals, 99% severe calcified prox. to mid RCA, otherwise mild to moderate CAD (medically managed)  . Esophageal cancer, stage IIIB [facility]) oncologist-  dr ennever/  dr moody   dx 2010  SCC Stage IIIB completed chemoradiation;  localized recurrent left piriform  sinus 01/ 2015,  completed concurrent chemoradiatoin 04/ 2015  . GERD (gastroesophageal reflux disease)    09-07-2018   no issues since Gtube removed 06/ 2019  . Headache   . History of alcohol abuse    quit 2001  . History of cancer chemotherapy    2010;   2015  . History of radiation therapy    10-19-2013 to  12-05-2013 pyriform sinus 69.96 Gy/37fx;   Radiation completed 2010 for esophageal cancer  . History of seizure 2001   alcohol withdrawal  . HOH (hard of hearing)   . Hyperlipidemia   . Hypertension   . Ischemic cardiomyopathy 12/2016   01-03-2017  echo,  ef 10-15%/   echo 05-06-2017 EF improved to 45-50%  . Lower urinary tract symptoms (LUTS)   . Prostate cancer [facility]) urologist-  dr ottelin/  oncologist-- [name]   dx 05-27-2018--- Stage T1c,  Gleason 4+3,  PSA 9.3--  scheduled for brachytherapy 09-23-2017  . Renal cyst, left   . Voice hoarseness    secondary to radiation treatment    Past Surgical History:  Procedure Laterality Date  . CARDIOVASCULAR STRESS TEST  10/09/09   normal nuclearr stress test, EF 57% [name])  . IR GASTROSTOMY TUBE MOD SED  01/13/2017  . IR GASTROSTOMY TUBE REMOVAL  02/03/2018  . IR PATIENT EVAL TECH 0-60 MINS  03/25/2017  .  IR REMOVAL TUN ACCESS W/ PORT W/O FL MOD SED  01/15/2017  . IR REPLACE G-TUBE SIMPLE WO FLUORO  01/13/2018  . LARYNGOSCOPY N/A 09/15/2013   Procedure: LARYNGOSCOPY;  Surgeon: [name], MD;  Location: [place];  Service: ENT;  Laterality: N/A;  direct laryngoscopy with biopsy and esophagoscopy  . RADIOACTIVE SEED IMPLANT N/A 09/23/2018   Procedure: RADIOACTIVE SEED IMPLANT/BRACHYTHERAPY IMPLANT;  Surgeon: [name], MD;  Location[facility];  Service: Urology;  Laterality: N/A;  . RIGHT/LEFT HEART CATH AND CORONARY ANGIOGRAPHY N/A 01/05/2017   Procedure: Right/Left Heart Cath and Coronary Angiography;  Surgeon: [name], MD;  Location: [place] CV LAB;  Service: Cardiovascular;  Laterality:  N/A;  . TRANSTHORACIC ECHOCARDIOGRAM  05/06/2017   ef 45-50%,  grade 1 diastolic dysfunction/  AV severe calcified non coronary cusp with moderate regurg. , no stenosis (valve area per echo 01-05-2017 1.08cm^2)/  mild MR, TR, and PR/  mild LAE    Current Medications: Current Meds  Medication Sig  . aspirin EC 81 MG tablet Take 81 mg by mouth daily.  Marland Kitchen atorvastatin (LIPITOR) 80 MG tablet Take 1 tablet (80 mg total) by mouth daily. To lower cholesterol  . doxycycline (VIBRA-TABS) 100 MG tablet Take 1 tablet (100 mg total) by mouth 2 (two) times daily. (Patient taking differently: Take 100 mg by mouth daily. )  . fentaNYL (DURAGESIC) 12 MCG/HR Place 1 patch onto the skin every 3 (three) days.  Marland Kitchen HYDROcodone-acetaminophen (NORCO) 10-325 MG tablet Take 1-2 tablets by mouth every 4 (four) hours as needed for moderate pain. Maximum dose per 24 hours - 8 pills  . metoprolol succinate (TOPROL-XL) 25 MG 24 hr tablet Take 0.5 tablets (12.5 mg total) by mouth daily.  Marland Kitchen omeprazole (PRILOSEC) 20 MG capsule Take 1 capsule (20 mg total) by mouth daily. To reduce stomach acid, stomach protection  . sacubitril-valsartan (ENTRESTO) 24-26 MG Take 1 tablet by mouth 2 (two) times daily.  . tamsulosin (FLOMAX) 0.4 MG CAPS capsule Take 1 capsule (0.4 mg total) by mouth daily after supper.  . [DISCONTINUED] metoprolol succinate (TOPROL-XL) 25 MG 24 hr tablet TAKE 1/2 TABLET BY MOUTH DAILY. FOLLOW-UP APPOINTMENT*     Allergies:   Patient has no known allergies.   Social History   Socioeconomic History  . Marital status: Legally Separated    Spouse name: Not on file  . Number of children: 2  . Years of education: Not on file  . Highest education level: Not on file  Occupational History  . Occupation: retired  Scientific laboratory technician  . Financial resource strain: Not on file  . Food insecurity    Worry: Not on file    Inability: Not on file  . Transportation needs    Medical: No    Non-medical: No  Tobacco Use  .  Smoking status: Former Smoker    Packs/day: 1.00    Years: 40.00    Pack years: 40.00    Types: Cigarettes    Start date: 11/08/1960    Quit date: 08/31/2009    Years since quitting: 9.8  . Smokeless tobacco: Former Systems developer    Types: [place] date: 09/01/1999  Substance and Sexual Activity  . Alcohol use: Not Currently    Alcohol/week: 0.0 standard drinks    Comment: quit in 2001  . Drug use: No    Comment: back in the day used cocaine,alcohol, marijuana  . Sexual activity: Yes  Lifestyle  . Physical activity  Days per week: Not on file    Minutes per session: Not on file  . Stress: Not on file  Relationships  . Social Herbalist on phone: Not on file    Gets together: Not on file    Attends religious service: Not on file    Active member of club or organization: Not on file    Attends meetings of clubs or organizations: Not on file    Relationship status: Not on file  Other Topics Concern  . Not on file  Social History Narrative  . Not on file     Family History: The patient's family history includes Breast cancer in his mother. There is no history of Prostate cancer, Colon cancer, or Pancreatic cancer.  ROS:   Please see the history of present illness.     All other systems reviewed and are negative.  EKGs/Labs/Other Studies Reviewed:    The following studies were reviewed today: Echo sept 2018  EKG:  EKG is  ordered today.  The ekg ordered today demonstrates NSR, , PVC, LV  Recent Labs: 11/11/2018: TSH 2.199 06/19/2019: ALT 25; BUN 20; Creatinine 1.39; Hemoglobin 13.6; Platelet Count 174; Potassium 4.5; Sodium 135  Recent Lipid Panel    Component Value Date/Time   CHOL 159 02/21/2018 0817   TRIG 40 02/21/2018 0817   HDL 71 02/21/2018 0817   CHOLHDL 2.2 02/21/2018 0817   LDLCALC 80 02/21/2018 0817    Physical Exam:    VS:  BP (!) 150/82   Pulse 78   Ht 5\' 6"  (1.676 m)   Wt 131 lb (59.4 kg)   BMI 21.14 kg/m     Wt Readings from Last 3  Encounters:  07/05/19 131 lb (59.4 kg)  06/19/19 131 lb 1.9 oz (59.5 kg)  03/16/19 127 lb (57.6 kg)     GEN:  Well nourished, well developed in no acute distress HEENT: Normal NECK: No JVD; No carotid bruits LYMPHATICS: No lymphadenopathy CARDIAC: RRR, no murmurs, rubs, gallops RESPIRATORY:  Clear to auscultation without rales, wheezing or rhonchi  ABDOMEN: Soft, non-tender, non-distended MUSCULOSKELETAL:  No edema; No deformity  SKIN: Warm and dry NEUROLOGIC:  Alert and oriented x 3 PSYCHIATRIC:  Normal affect   ASSESSMENT:    CAD (coronary artery disease) High grade pRCA disease and CTO of CFX at cath May 2018- no PCI secondary to CRI and acute illness.  His EF did recover without intervention.   Congestive dilated cardiomyopathy ([place]) EF 10-15% by echo May 2018- improved to 45-50% by echo Sept 2018  CRI (chronic renal insufficiency), stage 3 (moderate) SCr 1.4- GFR 51  Malignant neoplasm of pyriform sinus (HCC) Required tube feeding till June 2019  Malignant neoplasm of prostate ([place]) S/P seed implant Jan 2020  PLAN:    Same Rx- f/u 6 months.   Medication Adjustments/Labs and Tests Ordered: Current medicines are reviewed at length with the patient today.  Concerns regarding medicines are outlined above.  Orders Placed This Encounter  Procedures  . EKG 12-Lead   Meds ordered this encounter  Medications  . metoprolol succinate (TOPROL-XL) 25 MG 24 hr tablet    Sig: Take 0.5 tablets (12.5 mg total) by mouth daily.    Dispense:  45 tablet    Refill:  3    Patient Instructions  Medication Instructions:  Your physician recommends that you continue on your current medications as directed. Please refer to the Current Medication list given to you today. *If you need  a refill on your cardiac medications before your next appointment, please call your pharmacy*  Lab Work: None  If you have labs (blood work) drawn today and your tests are completely normal, you will  receive your results only by: Marland Kitchen MyChart Message (if you have MyChart) OR . A paper copy in the mail If you have any lab test that is abnormal or we need to change your treatment, we will call you to review the results.  Testing/Procedures: None   Follow-Up: At [facility], you and your health needs are our priority.  As part of our continuing mission to provide you with exceptional heart care, we have created designated Provider Care Teams.  These Care Teams include your primary Cardiologist (physician) and Advanced Practice Providers (APPs -  Physician Assistants and Nurse Practitioners) who all work together to provide you with the care you need, when you need it.  Your next appointment:   6 months  The format for your next appointment:   In Person  Provider:   Kerin Ransom, PA-C  Other Instructions     Signed, Kerin Ransom, PA-C  07/05/2019 4:37 PM    [place]

## 2019-07-31 ENCOUNTER — Ambulatory Visit (HOSPITAL_COMMUNITY)
Admission: EM | Admit: 2019-07-31 | Discharge: 2019-07-31 | Disposition: A | Discharge status: Home / Self Care | Payer: Medicare Other | Attending: Family Medicine | Admitting: Family Medicine

## 2019-07-31 ENCOUNTER — Other Ambulatory Visit: Payer: Self-pay

## 2019-07-31 ENCOUNTER — Ambulatory Visit (INDEPENDENT_AMBULATORY_CARE_PROVIDER_SITE_OTHER): Payer: Medicare Other

## 2019-07-31 ENCOUNTER — Encounter (HOSPITAL_COMMUNITY): Payer: Self-pay

## 2019-07-31 ENCOUNTER — Other Ambulatory Visit: Payer: Self-pay | Admitting: *Deleted

## 2019-07-31 DIAGNOSIS — I255 Ischemic cardiomyopathy: Secondary | ICD-10-CM | POA: Insufficient documentation

## 2019-07-31 DIAGNOSIS — I5042 Chronic combined systolic (congestive) and diastolic (congestive) heart failure: Secondary | ICD-10-CM | POA: Diagnosis not present

## 2019-07-31 DIAGNOSIS — Z87891 Personal history of nicotine dependence: Secondary | ICD-10-CM | POA: Insufficient documentation

## 2019-07-31 DIAGNOSIS — R0981 Nasal congestion: Secondary | ICD-10-CM | POA: Diagnosis not present

## 2019-07-31 DIAGNOSIS — J40 Bronchitis, not specified as acute or chronic: Secondary | ICD-10-CM

## 2019-07-31 DIAGNOSIS — R05 Cough: Secondary | ICD-10-CM | POA: Diagnosis present

## 2019-07-31 DIAGNOSIS — J449 Chronic obstructive pulmonary disease, unspecified: Secondary | ICD-10-CM | POA: Diagnosis not present

## 2019-07-31 DIAGNOSIS — I25119 Atherosclerotic heart disease of native coronary artery with unspecified angina pectoris: Secondary | ICD-10-CM | POA: Insufficient documentation

## 2019-07-31 DIAGNOSIS — Z7982 Long term (current) use of aspirin: Secondary | ICD-10-CM | POA: Diagnosis not present

## 2019-07-31 DIAGNOSIS — M791 Myalgia, unspecified site: Secondary | ICD-10-CM

## 2019-07-31 DIAGNOSIS — I130 Hypertensive heart and chronic kidney disease with heart failure and stage 1 through stage 4 chronic kidney disease, or unspecified chronic kidney disease: Secondary | ICD-10-CM | POA: Insufficient documentation

## 2019-07-31 DIAGNOSIS — N183 Chronic kidney disease, stage 3 unspecified: Secondary | ICD-10-CM | POA: Diagnosis not present

## 2019-07-31 DIAGNOSIS — C12 Malignant neoplasm of pyriform sinus: Secondary | ICD-10-CM

## 2019-07-31 DIAGNOSIS — Z79899 Other long term (current) drug therapy: Secondary | ICD-10-CM | POA: Insufficient documentation

## 2019-07-31 DIAGNOSIS — K219 Gastro-esophageal reflux disease without esophagitis: Secondary | ICD-10-CM | POA: Diagnosis not present

## 2019-07-31 DIAGNOSIS — Z20828 Contact with and (suspected) exposure to other viral communicable diseases: Secondary | ICD-10-CM | POA: Diagnosis not present

## 2019-07-31 DIAGNOSIS — R059 Cough, unspecified: Secondary | ICD-10-CM

## 2019-07-31 DIAGNOSIS — E785 Hyperlipidemia, unspecified: Secondary | ICD-10-CM | POA: Diagnosis not present

## 2019-07-31 DIAGNOSIS — C61 Malignant neoplasm of prostate: Secondary | ICD-10-CM

## 2019-07-31 LAB — POC SARS CORONAVIRUS 2 AG: SARS Coronavirus 2 Ag: NEGATIVE

## 2019-07-31 LAB — POC SARS CORONAVIRUS 2 AG -  ED: SARS Coronavirus 2 Ag: NEGATIVE

## 2019-07-31 MED ORDER — FENTANYL 12 MCG/HR TD PT72: 1.0000 | MEDICATED_PATCH | TRANSDERMAL | 0 refills | Status: DC

## 2019-07-31 NOTE — Discharge Instructions (Addendum)
Your Covid test was negative here today. Your x-ray did not show any pneumonia. This is most likely some sort of viral upper respiratory infection.  You can continue the medication for her symptoms.  Make sure you are drinking plenty of water to keep your secretions thin. If your symptoms worsen you will need to follow-up.

## 2019-07-31 NOTE — ED Provider Notes (Signed)
[place]    CSN: [id number] Arrival date & time: 07/31/19  1129      History   Chief Complaint Chief Complaint  Patient presents with  . URI    HPI Collin Young is a 68 y.o. male.   Patient is a 68 year old male with past medical history of CHF, COPD, CAD, esophageal cancer, GERD, alcohol abuse, hypertension, prostate cancer. He presents today with approximately 3 days of productive cough, chest congestion, thick green mucus, nasal drainage and generalized body aches.  Symptoms have been constant, waxing waning.  Denies any shortness of breath.  He has been taking Coricidin with some relief of symptoms.  No fever, chills.  No lower extremity edema.  No known sick contacts or recent traveling.  ROS per HPI      Past Medical History:  Diagnosis Date  . Chronic combined systolic and diastolic CHF (congestive heart failure) ([place])    followed by [name]  . COPD (chronic obstructive pulmonary disease) ([place])   . Coronary artery disease cardiologist-- [name]   per cardiac cath 01-05-2017  chronic total occlusion pLCx with collaterals, 99% severe calcified prox. to mid RCA, otherwise mild to moderate CAD (medically managed)  . Esophageal cancer, stage IIIB [facility]) oncologist-  dr ennever/  dr moody   dx 2010  SCC Stage IIIB completed chemoradiation;  localized recurrent left piriform sinus 01/ 2015,  completed concurrent chemoradiatoin 04/ 2015  . GERD (gastroesophageal reflux disease)    09-07-2018   no issues since Gtube removed 06/ 2019  . Headache   . History of alcohol abuse    quit 2001  . History of cancer chemotherapy    2010;   2015  . History of radiation therapy    10-19-2013 to  12-05-2013 pyriform sinus 69.96 Gy/52fx;   Radiation completed 2010 for esophageal cancer  . History of seizure 2001   alcohol withdrawal  . HOH (hard of hearing)   . Hyperlipidemia   . Hypertension   . Ischemic cardiomyopathy 12/2016   01-03-2017   echo,  ef 10-15%/   echo 05-06-2017 EF improved to 45-50%  . Lower urinary tract symptoms (LUTS)   . Prostate cancer [facility]) urologist-  dr ottelin/  oncologist-- [name]   dx 05-27-2018--- Stage T1c,  Gleason 4+3,  PSA 9.3--  scheduled for brachytherapy 09-23-2017  . Renal cyst, left   . Voice hoarseness    secondary to radiation treatment    Patient Active Problem List   Diagnosis Date Noted  . CAD (coronary artery disease) 07/05/2019  . CRI (chronic renal insufficiency), stage 3 (moderate) 07/05/2019  . Combined systolic and diastolic heart failure ([place]) 09/26/2018  . Malignant neoplasm of prostate ([place]) 06/17/2018  . SOB (shortness of breath)   . Angina decubitus ([place])   . Aspiration pneumonia of both lower lobes due to gastric secretions ([place]) 01/11/2017  . Staphylococcus aureus bacteremia 01/11/2017  . Encounter for feeding tube placement   . Dysphagia   . Palliative care by specialist   . Acute respiratory failure with hypoxia ([place])   . Acute congestive heart failure ([place])   . Ischemic cardiomyopathy   . Congestive dilated cardiomyopathy ([place])   . Acute combined systolic and diastolic heart failure ([place]) 01/02/2017  . AKI (acute kidney injury) ([place]) 01/02/2017  . Malignant neoplasm of pyriform sinus ([place]) 09/28/2013  . 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 NONSPECIFIC ABN FINDING RAD & OTH EXAM GI TRACT 05/14/2009  Past Surgical History:  Procedure Laterality Date  . CARDIOVASCULAR STRESS TEST  10/09/09   normal nuclearr stress test, EF 57% [name])  . IR GASTROSTOMY TUBE MOD SED  01/13/2017  . IR GASTROSTOMY TUBE REMOVAL  02/03/2018  . IR PATIENT EVAL TECH 0-60 MINS  03/25/2017  . IR REMOVAL TUN ACCESS W/ PORT W/O FL MOD SED  01/15/2017  . IR REPLACE G-TUBE SIMPLE WO FLUORO  01/13/2018  . LARYNGOSCOPY N/A 09/15/2013   Procedure: LARYNGOSCOPY;  Surgeon: [name], MD;  Location: [facility];  Service: ENT;  Laterality: N/A;  direct laryngoscopy with biopsy and esophagoscopy   . RADIOACTIVE SEED IMPLANT N/A 09/23/2018   Procedure: RADIOACTIVE SEED IMPLANT/BRACHYTHERAPY IMPLANT;  Surgeon: [name], MD;  Location: [facility];  Service: Urology;  Laterality: N/A;  . RIGHT/LEFT HEART CATH AND CORONARY ANGIOGRAPHY N/A 01/05/2017   Procedure: Right/Left Heart Cath and Coronary Angiography;  Surgeon: [name], MD;  Location: [facility];  Service: Cardiovascular;  Laterality: N/A;  . TRANSTHORACIC ECHOCARDIOGRAM  05/06/2017   ef 45-50%,  grade 1 diastolic dysfunction/  AV severe calcified non coronary cusp with moderate regurg. , no stenosis (valve area per echo 01-05-2017 1.08cm^2)/  mild MR, TR, and PR/  mild LAE       Home Medications    Prior to Admission medications   Medication Sig Start Date End Date Taking? Authorizing Provider  aspirin EC 81 MG tablet Take 81 mg by mouth daily.    [provider]  atorvastatin (LIPITOR) 80 MG tablet Take 1 tablet (80 mg total) by mouth daily. To lower cholesterol 02/09/19   Fulp, Cammie, MD  fentaNYL (DURAGESIC) 12 MCG/HR Place 1 patch onto the skin every 3 (three) days. 07/31/19   [name], MD  HYDROcodone-acetaminophen (NORCO) 10-325 MG tablet Take 1-2 tablets by mouth every 4 (four) hours as needed for moderate pain. Maximum dose per 24 hours - 8 pills 09/23/18   [name], MD  metoprolol succinate (TOPROL-XL) 25 MG 24 hr tablet Take 0.5 tablets (12.5 mg total) by mouth daily. 07/05/19   Erlene Quan, PA-C  omeprazole (PRILOSEC) 20 MG capsule Take 1 capsule (20 mg total) by mouth daily. To reduce stomach acid, stomach protection 09/26/18   Fulp, Cammie, MD  sacubitril-valsartan (ENTRESTO) 24-26 MG Take 1 tablet by mouth 2 (two) times daily. 05/25/19   Skeet Latch, MD  tamsulosin (FLOMAX) 0.4 MG CAPS capsule Take 1 capsule (0.4 mg total) by mouth daily after supper. 02/15/19   Antony Blackbird, MD    Family History Family History  Problem Relation Age of Onset  .  Breast cancer Mother   . Prostate cancer Neg Hx   . Colon cancer Neg Hx   . Pancreatic cancer Neg Hx     Social History Social History   Tobacco Use  . Smoking status: Former Smoker    Packs/day: 1.00    Years: 40.00    Pack years: 40.00    Types: Cigarettes    Start date: 11/08/1960    Quit date: 08/31/2009    Years since quitting: 9.9  . Smokeless tobacco: Former Systems developer    Types: Chew    Quit date: 09/01/1999  Substance Use Topics  . Alcohol use: Not Currently    Alcohol/week: 0.0 standard drinks    Comment: quit in 2001  . Drug use: No    Comment: back in the day used cocaine,alcohol, marijuana     Allergies  Patient has no known allergies.   Review of Systems Review of Systems   Physical Exam Triage Vital Signs ED Triage Vitals  Enc Vitals Group     BP 07/31/19 1225 111/70     Pulse Rate 07/31/19 1225 99     Resp 07/31/19 1225 16     Temp 07/31/19 1225 98.6 F (37 C)     Temp Source 07/31/19 1225 Oral     SpO2 07/31/19 1225 99 %     Weight --      Height --      Head Circumference --      Peak Flow --      Pain Score 07/31/19 1227 5     Pain Loc --      Pain Edu? --      Excl. in [place]? --    No data found.  Updated Vital Signs BP 111/70 (BP Location: Right Arm)   Pulse 99   Temp 98.6 F (37 C) (Oral)   Resp 16   SpO2 99%   Visual Acuity Right Eye Distance:   Left Eye Distance:   Bilateral Distance:    Right Eye Near:   Left Eye Near:    Bilateral Near:     Physical Exam Vitals signs and nursing note reviewed.  Constitutional:      General: He is not in acute distress.    Appearance: He is well-developed. He is not ill-appearing, toxic-appearing or diaphoretic.     Comments: Very pleasant  HENT:     Head: Normocephalic and atraumatic.     Right Ear: Tympanic membrane and ear canal normal.     Left Ear: Tympanic membrane and ear canal normal.     Nose: Nose normal.  Eyes:     Conjunctiva/sclera: Conjunctivae normal.  Neck:      Musculoskeletal: Normal range of motion and neck supple.  Cardiovascular:     Rate and Rhythm: Normal rate and regular rhythm.     Pulses: Normal pulses.     Heart sounds: Normal heart sounds. No murmur.  Pulmonary:     Effort: Pulmonary effort is normal.     Breath sounds: Rhonchi present.     Comments: Bilateral rhonchi in lower lungs.   Abdominal:     Palpations: Abdomen is soft.     Tenderness: There is no abdominal tenderness.  Musculoskeletal: Normal range of motion.  Skin:    General: Skin is warm and dry.  Neurological:     Mental Status: He is alert.  Psychiatric:        Mood and Affect: Mood normal.      UC Treatments / Results  Labs (all labs ordered are listed, but only abnormal results are displayed) Labs Reviewed  NOVEL CORONAVIRUS, NAA (HOSP ORDER, SEND-OUT TO REF LAB; TAT 18-24 HRS)  POC SARS CORONAVIRUS 2 AG -  ED  POC SARS CORONAVIRUS 2 AG    EKG   Radiology Dg Chest 2 View  Result Date: 07/31/2019 CLINICAL DATA:  Cough, abnormal lung sounds, hoarseness, shortness of breath. EXAM: CHEST - 2 VIEW COMPARISON:  09/26/2018. FINDINGS: Trachea is midline. Heart size normal. Thoracic aorta is calcified. Biapical pleural thickening. Lungs are hyperinflated but clear. No pleural fluid. IMPRESSION: Hyperinflation without acute finding. Electronically Signed   By: Lorin Picket M.D.   On: 07/31/2019 13:30    Procedures Procedures (including critical care time)  Medications Ordered in UC Medications - No data to display  Initial Impression / Assessment  and Plan / UC Course  I have reviewed the triage vital signs and the nursing notes.  Pertinent labs & imaging results that were available during my care of the patient were reviewed by me and considered in my medical decision making (see chart for details).      Cough- rhonchi heard on exam, most likely congestion. Reports cough is productive. No dyspnea, distress. No wheezing.  Chest x ray with  hyperinflation and no signs of pneumonia.  Pt can continue using OTC meds as needed.  Deferred from steroids for now based on CHF hx. He does not use daily inhalers.  This should resolve with time Rapid COVID negative    Final Clinical Impressions(s) / UC Diagnoses   Final diagnoses:  Bronchitis     Discharge Instructions     Your Covid test was negative here today. Your x-ray did not show any pneumonia. This is most likely some sort of viral upper respiratory infection.  You can continue the medication for her symptoms.  Make sure you are drinking plenty of water to keep your secretions thin. If your symptoms worsen you will need to follow-up.    ED Prescriptions    None     PDMP not reviewed this encounter.   Orvan July, NP 08/01/19 [phone]

## 2019-07-31 NOTE — ED Triage Notes (Signed)
Pt presents with chest congestion, productive cough with green mucus, nasal drainage, and generalized body aches X 3 days.

## 2019-08-02 LAB — NOVEL CORONAVIRUS, NAA (HOSP ORDER, SEND-OUT TO REF LAB; TAT 18-24 HRS): SARS-CoV-2, NAA: NOT DETECTED

## 2019-08-02 NOTE — Progress Notes (Signed)
Patient ID: Collin Young, male   DOB: [birth year], 68 y.o.   MRN: [id number] Virtual Visit via Telephone Note  I connected with Collin Young on 08/03/19 at  9:10 AM EST by telephone and verified that I am speaking with the correct person using two identifiers.   I discussed the limitations, risks, security and privacy concerns of performing an evaluation and management service by telephone and the availability of in person appointments. I also discussed with the patient that there may be a patient responsible charge related to this service. The patient expressed understanding and agreed to proceed.  Patient location:  home My Location:  [facility] office Persons on the call:  Me and the person   History of Present Illness: After being seen at [facility] for cough on 07/31/2019.  Cough is much better.  He is feeling much better.  No fever.  Has not had labs in a while.  No SOB.  Feeling much better overall.  No new concerns today  From UC A/P: Cough- rhonchi heard on exam, most likely congestion. Reports cough is productive. No dyspnea, distress. No wheezing.  Chest x ray with hyperinflation and no signs of pneumonia.  Pt can continue using OTC meds as needed.  Deferred from steroids for now based on CHF hx. He does not use daily inhalers.  This should resolve with time Rapid COVID negative    Observations/Objective:  NAD.  Voice is hoarse from previous CA   Assessment and Plan: 1. Cough resolved  2. Encounter for examination following treatment at hospital Improved.  No njew concerns today    Follow Up Instructions: See PCP in 4-6 weeks for labs/chronic conditions   I discussed the assessment and treatment plan with the patient. The patient was provided an opportunity to ask questions and all were answered. The patient agreed with the plan and demonstrated an understanding of the instructions.   The patient was advised to call back or seek an in-person evaluation if the symptoms worsen  or if the condition fails to improve as anticipated.  I provided 6 minutes of non-face-to-face time during this encounter.   Freeman Caldron, PA-C

## 2019-08-03 ENCOUNTER — Ambulatory Visit: Payer: Medicare Other | Attending: Family Medicine | Admitting: Physician Assistant

## 2019-08-03 ENCOUNTER — Other Ambulatory Visit: Payer: Self-pay

## 2019-08-03 DIAGNOSIS — R05 Cough: Secondary | ICD-10-CM | POA: Diagnosis not present

## 2019-08-03 DIAGNOSIS — Z09 Encounter for follow-up examination after completed treatment for conditions other than malignant neoplasm: Secondary | ICD-10-CM

## 2019-08-03 DIAGNOSIS — R059 Cough, unspecified: Secondary | ICD-10-CM

## 2019-08-03 NOTE — Progress Notes (Signed)
Patient has been called and DOB has been verified. Patient has been screened and transferred to PCP to start phone visit.  Patient states that he went to the ED and they treated him, he feels much better.  Patient has no concerns today.

## 2019-09-08 ENCOUNTER — Ambulatory Visit: Payer: Medicare Other | Attending: Family Medicine | Admitting: Family Medicine

## 2019-09-08 ENCOUNTER — Other Ambulatory Visit: Payer: Self-pay

## 2019-09-12 ENCOUNTER — Other Ambulatory Visit: Payer: Self-pay | Admitting: *Deleted

## 2019-09-12 DIAGNOSIS — C61 Malignant neoplasm of prostate: Secondary | ICD-10-CM

## 2019-09-12 DIAGNOSIS — C12 Malignant neoplasm of pyriform sinus: Secondary | ICD-10-CM

## 2019-09-13 MED ORDER — FENTANYL 12 MCG/HR TD PT72: 1.0000 | MEDICATED_PATCH | TRANSDERMAL | 0 refills | Status: DC

## 2019-10-19 ENCOUNTER — Other Ambulatory Visit: Payer: Self-pay

## 2019-10-19 ENCOUNTER — Inpatient Hospital Stay: Payer: Medicare Other | Attending: Hematology & Oncology

## 2019-10-19 ENCOUNTER — Inpatient Hospital Stay (HOSPITAL_BASED_OUTPATIENT_CLINIC_OR_DEPARTMENT_OTHER): Payer: Medicare Other | Admitting: Hematology & Oncology

## 2019-10-19 ENCOUNTER — Encounter: Payer: Self-pay | Admitting: Hematology & Oncology

## 2019-10-19 VITALS — BP 145/79 | HR 90 | Temp 96.9°F | Resp 18 | Ht 66.0 in | Wt 140.8 lb

## 2019-10-19 DIAGNOSIS — R42 Dizziness and giddiness: Secondary | ICD-10-CM | POA: Insufficient documentation

## 2019-10-19 DIAGNOSIS — E038 Other specified hypothyroidism: Secondary | ICD-10-CM | POA: Diagnosis not present

## 2019-10-19 DIAGNOSIS — C61 Malignant neoplasm of prostate: Secondary | ICD-10-CM

## 2019-10-19 DIAGNOSIS — C12 Malignant neoplasm of pyriform sinus: Secondary | ICD-10-CM | POA: Diagnosis present

## 2019-10-19 LAB — CMP (CANCER CENTER ONLY)
ALT: 22 U/L (ref 0–44)
AST: 26 U/L (ref 15–41)
Albumin: 4.1 g/dL (ref 3.5–5.0)
Alkaline Phosphatase: 35 U/L — ABNORMAL LOW (ref 38–126)
Anion gap: 6 (ref 5–15)
BUN: 15 mg/dL (ref 8–23)
CO2: 33 mmol/L — ABNORMAL HIGH (ref 22–32)
Calcium: 10.2 mg/dL (ref 8.9–10.3)
Chloride: 98 mmol/L (ref 98–111)
Creatinine: 1.36 mg/dL — ABNORMAL HIGH (ref 0.61–1.24)
GFR, Est AFR Am: >60 mL/min (ref >60)
GFR, Estimated: 53 mL/min — ABNORMAL LOW (ref >60)
Glucose, Bld: 170 mg/dL — ABNORMAL HIGH (ref 70–99)
Potassium: 5 mmol/L (ref 3.5–5.1)
Sodium: 137 mmol/L (ref 135–145)
Total Bilirubin: 0.4 mg/dL (ref 0.3–1.2)
Total Protein: 7.8 g/dL (ref 6.5–8.1)

## 2019-10-19 LAB — CBC WITH DIFFERENTIAL (CANCER CENTER ONLY)
Abs Immature Granulocytes: 0.01 10*3/uL (ref 0.00–0.07)
Basophils Absolute: 0 10*3/uL (ref 0.0–0.1)
Basophils Relative: 1 %
Eosinophils Absolute: 0.1 10*3/uL (ref 0.0–0.5)
Eosinophils Relative: 2 %
HCT: 49.1 % (ref 39.0–52.0)
Hemoglobin: 15.5 g/dL (ref 13.0–17.0)
Immature Granulocytes: 0 %
Lymphocytes Relative: 33 %
Lymphs Abs: 1.4 10*3/uL (ref 0.7–4.0)
MCH: 28.4 pg (ref 26.0–34.0)
MCHC: 31.6 g/dL (ref 30.0–36.0)
MCV: 89.9 fL (ref 80.0–100.0)
Monocytes Absolute: 0.7 10*3/uL (ref 0.1–1.0)
Monocytes Relative: 17 %
Neutro Abs: 2.1 10*3/uL (ref 1.7–7.7)
Neutrophils Relative %: 47 %
Platelet Count: 205 10*3/uL (ref 150–400)
RBC: 5.46 MIL/uL (ref 4.22–5.81)
RDW: 13.3 % (ref 11.5–15.5)
WBC Count: 4.3 10*3/uL (ref 4.0–10.5)
nRBC: 0 % (ref 0.0–0.2)

## 2019-10-19 MED ORDER — FENTANYL 12 MCG/HR TD PT72: 1.0000 | MEDICATED_PATCH | TRANSDERMAL | 0 refills | Status: DC

## 2019-10-19 NOTE — Progress Notes (Signed)
Hematology and Oncology Follow Up Visit  Collin Young [id number] 1951-04-24 69 y.o. 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  Collin Young is back for followup.  We see him every 6 months.  Since we last saw him, he has been doing okay although he does complain of some dizziness.  I think we probably will have to do some carotid Dopplers on him.  I think that he might be at risk for carotid stenosis because of past radiation therapy to the neck.  He is doing well with pain.  He is on a Duragesic patch which really helps.  He is swallowing okay.  He is having no problems with eating.  He has had no change in bowel or bladder habits.  There is been no cough or shortness of breath.  He did lose 2 sisters over the holidays to cancer.    Overall, his performance status is ECOG 1.    Medications:  Current Outpatient Medications:  .  aspirin EC 81 MG tablet, Take 81 mg by mouth daily., Disp: , Rfl:  .  atorvastatin (LIPITOR) 80 MG tablet, Take 1 tablet (80 mg total) by mouth daily. To lower cholesterol, Disp: 30 tablet, Rfl: 11 .  fentaNYL (DURAGESIC) 12 MCG/HR, Place 1 patch onto the skin every 3 (three) days., Disp: 10 patch, Rfl: 0 .  HYDROcodone-acetaminophen (NORCO) 10-325 MG tablet, Take 1-2 tablets by mouth every 4 (four) hours as needed for moderate pain. Maximum dose per 24 hours - 8 pills (Patient not taking: Reported on 08/03/2019), Disp: 8 tablet, Rfl: 0 .  metoprolol succinate (TOPROL-XL) 25 MG 24 hr tablet, Take 0.5 tablets (12.5 mg total) by mouth daily., Disp: 45 tablet, Rfl: 3 .  omeprazole (PRILOSEC) 20 MG capsule, Take 1 capsule (20 mg total) by mouth daily. To reduce stomach acid, stomach protection, Disp: 30  capsule, Rfl: 11 .  sacubitril-valsartan (ENTRESTO) 24-26 MG, Take 1 tablet by mouth 2 (two) times daily., Disp: 180 tablet, Rfl: 3 .  tamsulosin (FLOMAX) 0.4 MG CAPS capsule, Take 1 capsule (0.4 mg total) by mouth daily after supper., Disp: 30 capsule, Rfl: 6 No current facility-administered medications for this visit.  Facility-Administered Medications Ordered in Other Visits:  .  topical emolient (BIAFINE) emulsion, , Topical, Daily, Kyung Rudd, MD, Given at 01/19/14 [id number]  Allergies: No Known Allergies  Past Medical History, Surgical history, Social history, and Family History were reviewed and updated.  Review of Systems: Review of Systems  Constitutional: Negative.   HENT: Negative.   Eyes: Negative.   Respiratory: Negative.   Cardiovascular: Negative.   Gastrointestinal: Negative.   Genitourinary: Negative.   Musculoskeletal: Negative.   Skin: Negative.   Neurological: Negative.   Endo/Heme/Allergies: Negative.   Psychiatric/Behavioral: Negative.      Physical Exam:  vitals were not taken for this visit.   Physical Exam Vitals reviewed.  HENT:     Head: Normocephalic and atraumatic.  Eyes:     Pupils: Pupils are equal, round, and reactive to light.  Cardiovascular:     Rate and Rhythm: Normal rate and regular rhythm.     Heart sounds: Normal heart sounds.  Pulmonary:     Effort: Pulmonary effort is normal.     Breath  sounds: Normal breath sounds.  Abdominal:     General: Bowel sounds are normal.     Palpations: Abdomen is soft.     Comments: Abdominal exam shows a soft abdomen.  Bowel sounds are present.  He has the healed G-tube opening.  There is no exudate.  He has no fluid wave.  There is no palpable liver or spleen tip.  Musculoskeletal:        General: No tenderness or deformity. Normal range of motion.     Cervical back: Normal range of motion.  Lymphadenopathy:     Cervical: No cervical adenopathy.  Skin:    General: Skin is warm and dry.      Findings: No erythema or rash.  Neurological:     Mental Status: He is alert and oriented to person, place, and time.  Psychiatric:        Behavior: Behavior normal.        Thought Content: Thought content normal.        Judgment: Judgment normal.     Lab Results  Component Value Date   WBC 4.3 10/19/2019   HGB 15.5 10/19/2019   HCT 49.1 10/19/2019   MCV 89.9 10/19/2019   PLT 205 10/19/2019     Chemistry      Component Value Date/Time   NA 135 06/19/2019 1136   NA 139 10/05/2018 1112   NA 139 07/30/2017 0926   NA 137 03/10/2017 1000   K 4.5 06/19/2019 1136   K 3.9 07/30/2017 0926   K 4.3 03/10/2017 1000   CL 98 06/19/2019 1136   CL 96 (L) 07/30/2017 0926   CO2 32 06/19/2019 1136   CO2 33 07/30/2017 0926   CO2 29 03/10/2017 1000   BUN 20 06/19/2019 1136   BUN 19 10/05/2018 1112   BUN 17 07/30/2017 0926   BUN 14.7 03/10/2017 1000   CREATININE 1.39 (H) 06/19/2019 1136   CREATININE 1.4 (H) 07/30/2017 0926   CREATININE 1.1 03/10/2017 1000      Component Value Date/Time   CALCIUM 9.6 06/19/2019 1136   CALCIUM 9.6 07/30/2017 0926   CALCIUM 10.0 03/10/2017 1000   ALKPHOS 39 06/19/2019 1136   ALKPHOS 49 07/30/2017 0926   ALKPHOS 51 03/10/2017 1000   AST 31 06/19/2019 1136   AST 23 03/10/2017 1000   ALT 25 06/19/2019 1136   ALT 31 07/30/2017 0926   ALT 18 03/10/2017 1000   BILITOT 0.5 06/19/2019 1136   BILITOT 0.41 03/10/2017 1000         Impression and Plan: [name] is 69 year old gentleman. He had a localized recurrence of carcinoma of the left pyriform sinus. He underwent concurrent chemotherapy and radiation therapy. He finished his treatment back in April of 2015.   Everything seems to be doing quite well with him.  I really feel that his cancer is probably cured.  It has been almost 6 years since he completed treatment.  I am not sure about the dizziness.  We will check his carotids for stenosis.  We will also make sure that his thyroid is not  going out on him.  He is at risk for hypothyroidism.  Typically, we will see him in 6 months.  I think this is reasonable.   [name], MD 2/18/20211:05 PM

## 2019-11-02 ENCOUNTER — Other Ambulatory Visit: Payer: Self-pay

## 2019-11-02 ENCOUNTER — Ambulatory Visit (HOSPITAL_COMMUNITY)
Admission: RE | Admit: 2019-11-02 | Discharge: 2019-11-02 | Disposition: A | Discharge status: Home / Self Care | Payer: Medicare Other | Source: Ambulatory Visit | Attending: Hematology & Oncology | Admitting: Hematology & Oncology

## 2019-11-02 DIAGNOSIS — R42 Dizziness and giddiness: Secondary | ICD-10-CM | POA: Diagnosis not present

## 2019-11-02 NOTE — Progress Notes (Signed)
Carotid artery duplex completed. Refer to "CV Proc" under chart review to view preliminary results.  11/02/2019 10:37 AM Kelby Aline., MHA, RVT, RDCS, RDMS

## 2019-11-03 ENCOUNTER — Encounter: Payer: Self-pay | Admitting: Family

## 2019-11-03 ENCOUNTER — Ambulatory Visit (HOSPITAL_BASED_OUTPATIENT_CLINIC_OR_DEPARTMENT_OTHER): Payer: Medicare Other | Admitting: Family

## 2019-11-03 ENCOUNTER — Other Ambulatory Visit (HOSPITAL_COMMUNITY)
Admission: RE | Admit: 2019-11-03 | Discharge: 2019-11-03 | Disposition: A | Discharge status: Home / Self Care | Payer: Medicare Other | Source: Ambulatory Visit | Attending: Family | Admitting: Family

## 2019-11-03 VITALS — BP 185/88 | HR 99 | Temp 97.2°F | Resp 16 | Wt 140.0 lb

## 2019-11-03 DIAGNOSIS — Z113 Encounter for screening for infections with a predominantly sexual mode of transmission: Secondary | ICD-10-CM | POA: Insufficient documentation

## 2019-11-03 DIAGNOSIS — Z202 Contact with and (suspected) exposure to infections with a predominantly sexual mode of transmission: Secondary | ICD-10-CM

## 2019-11-03 DIAGNOSIS — R360 Urethral discharge without blood: Secondary | ICD-10-CM | POA: Diagnosis not present

## 2019-11-03 MED ORDER — CEFTRIAXONE SODIUM 250 MG IJ SOLR
500.0000 mg | Freq: Once | INTRAMUSCULAR | Status: AC
  Administered 2019-11-03: 500 mg via INTRAMUSCULAR

## 2019-11-03 MED ORDER — METRONIDAZOLE 500 MG PO TABS: 500.0000 mg | ORAL_TABLET | Freq: Two times a day (BID) | ORAL | 0 refills | Status: AC

## 2019-11-03 MED ORDER — AZITHROMYCIN 500 MG PO TABS
1000.0000 mg | ORAL_TABLET | Freq: Once | ORAL | Status: AC
  Administered 2019-11-03: 1000 mg via ORAL

## 2019-11-03 NOTE — Patient Instructions (Addendum)
Urine cytology pending. Ceftriaxone injection and Azithromycin in clinic today. Take Metronidazole at home. Chlamydia, Male  Chlamydia is an STD (sexually transmitted disease). It is a bacterial infection that spreads through sexual contact (is contagious). Chlamydia can occur in different areas of the body, including the tube that moves urine from the bladder out of the body (urethra), the throat, or the rectum. This condition is not difficult to treat. However, if left untreated, chlamydia can lead to more serious health problems. What are the causes? Chlamydia is caused by the bacteria Chlamydia trachomatis. It is passed from an infected partner during sexual activity. Chlamydia can spread through contact with the genitals, mouth, or rectum. What are the signs or symptoms? In some cases, there may not be any symptoms for this condition (asymptomatic), especially early in the infection. If symptoms develop, they may include:  Burning when urinating.  Urinating frequently.  Pain or swelling in the testicles.  Watery, mucus-like discharge from the penis.  Redness, soreness, and swelling (inflammation) of the rectum.  Bleeding or discharge from the rectum.  Abdominal pain.  Itching, burning, or redness in the eyes, or discharge from the eyes. How is this diagnosed? This condition may be diagnosed based on:  Urine tests.  Swab tests. Depending on your symptoms, your health care provider may use a cotton swab to collect discharge from your urethra or rectum to test for the bacteria. How is this treated? This condition is treated with antibiotic medicines. Follow these instructions at home: Medicines  Take over-the-counter and prescription medicines only as told by your health care provider.  Take your antibiotic medicine as told by your health care provider. Do not stop taking the antibiotic even if you start to feel better. Sexual activity  Tell sexual partners about your  infection. This includes any oral, anal, or vaginal sex partners you have had within 60 days of when your symptoms started. Sexual partners should also be treated, even if they have no signs of the disease.  Do not have sex until you and your sexual partners have completed treatment and your health care provider says it is okay. If your health care provider prescribed you a single dose treatment, wait 7 days after taking the treatment before having sex. General instructions  It is your responsibility to get your test results. Ask your health care provider, or the department performing the test, when your results will be ready.  Get plenty of rest.  Eat a healthy, well-balanced diet.  Drink enough fluids to keep your urine clear or pale yellow.  Keep all follow-up visits as told by your health care provider. This is important. You may need to be tested for infection again 3 months after treatment. How is this prevented? The only sure way to prevent chlamydia is to avoid sexual intercourse. However, you can lower your risk by:  Using latex condoms correctly every time you have sexual intercourse.  Not having multiple sexual partners.  Asking if your sexual partner has been tested for STIs and had negative results. Contact a health care provider if:  You develop new symptoms or your symptoms do not get better after completing treatment.  You have a fever or chills.  You have pain during sexual intercourse.  You develop new joint pain or swelling near your joints.  You have pain or soreness in your testicles. Get help right away if:  Your pain gets worse and does not get better with medicine.  You have abnormal discharge.  You develop flu-like symptoms, such as night sweats, sore throat, or muscle aches. Summary  Chlamydia is an STD (sexually transmitted disease). It is a bacterial infection that spreads (is contagious) through sexual contact.  This condition is not difficult  to treat, however, if left untreated, it can lead to more serious health problems.  In some cases, there may not be any symptoms for this condition (asymptomatic).  This condition is treated with antibiotic medicines.  Using latex condoms correctly every time you have sexual intercourse can help prevent chlamydia. This information is not intended to replace advice given to you by your health care provider. Make sure you discuss any questions you have with your health care provider. Document Revised: 09/01/2017 Document Reviewed: 08/03/2016 Elsevier Patient Education  [place].

## 2019-11-03 NOTE — Progress Notes (Signed)
Patient ID: Collin Young, male    DOB: 06-26-1951  MRN: [id number]  CC: STD Concern  Subjective: Collin Young is a 69 y.o. male with history of who presents for concerns of STD exposure.  1. STD Concerns: Reports squeezed glans penis this morning and saw small amount of white discharge. Discharge happened twice this morning and hasn't happened for the rest of the day. Reports told by male partner that she has an infection but not sure what it is. States has 1 male partner and they do not use protection. Last sexual intercourse 2 days ago. Denies discharge odor, burning, itching, genital redness, stomach pain, nausea, and vomiting.   Patient Active Problem List   Diagnosis Date Noted  . CAD (coronary artery disease) 07/05/2019  . CRI (chronic renal insufficiency), stage 3 (moderate) 07/05/2019  . Combined systolic and diastolic heart failure ([place]) 09/26/2018  . Malignant neoplasm of prostate ([place]) 06/17/2018  . SOB (shortness of breath)   . Angina decubitus ([place])   . Aspiration pneumonia of both lower lobes due to gastric secretions ([place]) 01/11/2017  . Staphylococcus aureus bacteremia 01/11/2017  . Encounter for feeding tube placement   . Dysphagia   . Palliative care by specialist   . Acute respiratory failure with hypoxia ([place])   . Acute congestive heart failure ([place])   . Ischemic cardiomyopathy   . Congestive dilated cardiomyopathy ([place])   . Acute combined systolic and diastolic heart failure ([place]) 01/02/2017  . AKI (acute kidney injury) ([place]) 01/02/2017  . Malignant neoplasm of pyriform sinus ([place]) 09/28/2013  . 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 NONSPECIFIC ABN FINDING RAD & OTH EXAM GI TRACT 05/14/2009     Current Outpatient Medications on File Prior to Visit  Medication Sig Dispense Refill  . aspirin EC 81 MG tablet Take 81 mg by mouth daily.    Marland Kitchen atorvastatin (LIPITOR) 80 MG tablet Take 1 tablet (80 mg total) by mouth daily. To lower cholesterol 30 tablet 11   . fentaNYL (DURAGESIC) 12 MCG/HR Place 1 patch onto the skin every 3 (three) days. 10 patch 0  . HYDROcodone-acetaminophen (NORCO) 10-325 MG tablet Take 1-2 tablets by mouth every 4 (four) hours as needed for moderate pain. Maximum dose per 24 hours - 8 pills 8 tablet 0  . metoprolol succinate (TOPROL-XL) 25 MG 24 hr tablet Take 0.5 tablets (12.5 mg total) by mouth daily. 45 tablet 3  . omeprazole (PRILOSEC) 20 MG capsule Take 1 capsule (20 mg total) by mouth daily. To reduce stomach acid, stomach protection 30 capsule 11  . sacubitril-valsartan (ENTRESTO) 24-26 MG Take 1 tablet by mouth 2 (two) times daily. 180 tablet 3  . tamsulosin (FLOMAX) 0.4 MG CAPS capsule Take 1 capsule (0.4 mg total) by mouth daily after supper. 30 capsule 6   Current Facility-Administered Medications on File Prior to Visit  Medication Dose Route Frequency Provider Last Rate Last Admin  . topical emolient (BIAFINE) emulsion   Topical Daily Kyung Rudd, MD   Given at 01/19/14 [id number]    No Known Allergies  Social History   Socioeconomic History  . Marital status: Legally Separated    Spouse name: Not on file  . Number of children: 2  . Years of education: Not on file  . Highest education level: Not on file  Occupational History  . Occupation: retired  Tobacco Use  . Smoking status: Former Smoker    Packs/day: 1.00    Years: 40.00    Pack years:  40.00    Types: Cigarettes    Start date: 11/08/1960    Quit date: 08/31/2009    Years since quitting: 10.1  . Smokeless tobacco: Former Systems developer    Types: [place] date: 09/01/1999  Substance and Sexual Activity  . Alcohol use: Not Currently    Alcohol/week: 0.0 standard drinks    Comment: quit in 2001  . Drug use: No    Comment: back in the day used cocaine,alcohol, marijuana  . Sexual activity: Yes  Other Topics Concern  . Not on file  Social History Narrative  . Not on file   Social Determinants of Health   Financial Resource Strain:   . Difficulty of  Paying Living Expenses: Not on file  Food Insecurity:   . Worried About Charity fundraiser in the Last Year: Not on file  . Ran Out of Food in the Last Year: Not on file  Transportation Needs:   . Lack of Transportation (Medical): Not on file  . Lack of Transportation (Non-Medical): Not on file  Physical Activity:   . Days of Exercise per Week: Not on file  . Minutes of Exercise per Session: Not on file  Stress:   . Feeling of Stress : Not on file  Social Connections:   . Frequency of Communication with Friends and Family: Not on file  . Frequency of Social Gatherings with Friends and Family: Not on file  . Attends Religious Services: Not on file  . Active Member of Clubs or Organizations: Not on file  . Attends Archivist Meetings: Not on file  . Marital Status: Not on file  Intimate Partner Violence:   . Fear of Current or Ex-Partner: Not on file  . Emotionally Abused: Not on file  . Physically Abused: Not on file  . Sexually Abused: Not on file    Family History  Problem Relation Age of Onset  . Breast cancer Mother   . Prostate cancer Neg Hx   . Colon cancer Neg Hx   . Pancreatic cancer Neg Hx     Past Surgical History:  Procedure Laterality Date  . CARDIOVASCULAR STRESS TEST  10/09/09   normal nuclearr stress test, EF 57% [name])  . IR GASTROSTOMY TUBE MOD SED  01/13/2017  . IR GASTROSTOMY TUBE REMOVAL  02/03/2018  . IR PATIENT EVAL TECH 0-60 MINS  03/25/2017  . IR REMOVAL TUN ACCESS W/ PORT W/O FL MOD SED  01/15/2017  . IR REPLACE G-TUBE SIMPLE WO FLUORO  01/13/2018  . LARYNGOSCOPY N/A 09/15/2013   Procedure: LARYNGOSCOPY;  Surgeon: [name], MD;  Location: [facility];  Service: ENT;  Laterality: N/A;  direct laryngoscopy with biopsy and esophagoscopy  . RADIOACTIVE SEED IMPLANT N/A 09/23/2018   Procedure: RADIOACTIVE SEED IMPLANT/BRACHYTHERAPY IMPLANT;  Surgeon: [name], MD;  Location[facility];  Service: Urology;  Laterality: N/A;  .  RIGHT/LEFT HEART CATH AND CORONARY ANGIOGRAPHY N/A 01/05/2017   Procedure: Right/Left Heart Cath and Coronary Angiography;  Surgeon: [name], MD;  Location: [facility];  Service: Cardiovascular;  Laterality: N/A;  . TRANSTHORACIC ECHOCARDIOGRAM  05/06/2017   ef 45-50%,  grade 1 diastolic dysfunction/  AV severe calcified non coronary cusp with moderate regurg. , no stenosis (valve area per echo 01-05-2017 1.08cm^2)/  mild MR, TR, and PR/  mild LAE   ROS: Review of Systems Negative except as stated above  PHYSICAL EXAM: Vitals with BMI 11/03/2019 10/19/2019 07/31/2019  Height - 5\' 6"  -  Weight 140 lbs 140 lbs 13 oz -  BMI [id number] -  Systolic [id number]  Diastolic 88 79 70  Pulse 99 90 99   Physical Exam General appearance - alert, well appearing, and in no distress and oriented to person, place, and time Mental status - alert, oriented to person, place, and time, normal mood, behavior, speech, dress, motor activity, and thought processes Chest - clear to auscultation, no wheezes, rales or rhonchi, symmetric air entry, no tachypnea, retractions or cyanosis Heart - normal rate, regular rhythm, normal S1, S2, no murmurs, rubs, clicks or gallops GU Male - deferred  CMP Latest Ref Rng & Units 10/19/2019 06/19/2019 03/16/2019  Glucose 70 - 99 mg/dL 170(H) 113(H) 110(H)  BUN 8 - 23 mg/dL 15 20 25(H)  Creatinine 0.61 - 1.24 mg/dL 1.36(H) 1.39(H) 1.63(H)  Sodium 135 - 145 mmol/L 137 135 134(L)  Potassium 3.5 - 5.1 mmol/L 5.0 4.5 4.9  Chloride 98 - 111 mmol/L 98 98 99  CO2 22 - 32 mmol/L 33(H) 32 29  Calcium 8.9 - 10.3 mg/dL 10.2 9.6 8.8(L)  Total Protein 6.5 - 8.1 g/dL 7.8 7.3 7.0  Total Bilirubin 0.3 - 1.2 mg/dL 0.4 0.5 0.4  Alkaline Phos 38 - 126 U/L 35(L) 39 45  AST 15 - 41 U/L 26 31 39  ALT 0 - 44 U/L 22 25 29    Lipid Panel     Component Value Date/Time   CHOL 159 02/21/2018 0817   TRIG 40 02/21/2018 0817   HDL 71 02/21/2018 0817   CHOLHDL 2.2 02/21/2018  0817   LDLCALC 80 02/21/2018 0817    CBC    Component Value Date/Time   WBC 4.3 10/19/2019 1242   WBC 2.4 (L) 09/16/2018 1331   RBC 5.46 10/19/2019 1242   HGB 15.5 10/19/2019 1242   HGB 13.3 09/26/2018 1624   HGB 13.9 07/30/2017 0926   HCT 49.1 10/19/2019 1242   HCT 40.5 09/26/2018 1624   HCT 42.3 07/30/2017 0926   PLT 205 10/19/2019 1242   PLT 204 09/26/2018 1624   MCV 89.9 10/19/2019 1242   MCV 85 09/26/2018 1624   MCV 89 07/30/2017 0926   MCH 28.4 10/19/2019 1242   MCHC 31.6 10/19/2019 1242   RDW 13.3 10/19/2019 1242   RDW 12.6 09/26/2018 1624   RDW 13.3 07/30/2017 0926   LYMPHSABS 1.4 10/19/2019 1242   LYMPHSABS 1.4 09/26/2018 1624   LYMPHSABS 0.8 (L) 07/30/2017 0926   MONOABS 0.7 10/19/2019 1242   EOSABS 0.1 10/19/2019 1242   EOSABS 0.1 09/26/2018 1624   EOSABS 0.2 07/30/2017 0926   BASOSABS 0.0 10/19/2019 1242   BASOSABS 0.0 09/26/2018 1624   BASOSABS 0.0 07/30/2017 0926    ASSESSMENT AND PLAN: 1. Screening for STD (sexually transmitted disease): - Urine cytology ancillary only - metroNIDAZOLE (FLAGYL) 500 MG tablet; Take 1 tablet (500 mg total) by mouth 2 (two) times daily for 7 days.  Dispense: 14 tablet; Refill: 0 - cefTRIAXone (ROCEPHIN) injection 500 mg - azithromycin (ZITHROMAX) tablet 1,000 mg -Treating empirically for gonorrhea, chlamydia, and trichomoniasis.    Patient was given the opportunity to ask questions.  Patient verbalized understanding of the plan and was able to repeat key elements of the plan. Patient was given clear instructions to go to Emergency Department or return to medical center if symptoms don't improve, worsen, or new problems develop.The patient verbalized understanding.   Requested Prescriptions    No prescriptions requested or  ordered in this encounter    No follow-ups on file.  Camillia Herter, NP

## 2019-11-09 LAB — URINE CYTOLOGY ANCILLARY ONLY
Candida Urine: NEGATIVE
Chlamydia: NEGATIVE
Comment: NEGATIVE
Comment: NEGATIVE
Comment: NORMAL
Neisseria Gonorrhea: NEGATIVE
Trichomonas: POSITIVE — AB

## 2019-12-06 ENCOUNTER — Other Ambulatory Visit: Payer: Self-pay

## 2019-12-06 DIAGNOSIS — C12 Malignant neoplasm of pyriform sinus: Secondary | ICD-10-CM

## 2019-12-06 DIAGNOSIS — C61 Malignant neoplasm of prostate: Secondary | ICD-10-CM

## 2019-12-06 MED ORDER — FENTANYL 12 MCG/HR TD PT72: 1.0000 | MEDICATED_PATCH | TRANSDERMAL | 0 refills | Status: DC

## 2019-12-25 ENCOUNTER — Telehealth: Payer: Self-pay | Admitting: Family Medicine

## 2019-12-25 NOTE — Telephone Encounter (Signed)
Patient came into the office and requested most recent lab results. Please follow up at your earliest convenience.

## 2019-12-25 NOTE — Telephone Encounter (Signed)
Per pt daughter patient will call office back per previous message

## 2019-12-27 NOTE — Telephone Encounter (Signed)
Patient was calling for results for his urine cytology from March.

## 2019-12-27 NOTE — Telephone Encounter (Signed)
Please let patient know that he did not leave a urine sample at his visit in March. He was only tested for STDs. He was given a Rocephin injection while in clinic. He was sent home with prescriptions for Flagyl and Azithromycin. Patient's STD test was positive for Trichomonas (the Flagyl that he was sent home with should take care of this). If patient would like to schedule an appointment to have urine tested or to be retested for STDs he can make an appointment for a time which is best for him or follow-up with his primary physician.

## 2020-01-03 ENCOUNTER — Encounter: Payer: Self-pay | Admitting: Family Medicine

## 2020-01-03 ENCOUNTER — Other Ambulatory Visit: Payer: Self-pay

## 2020-01-03 ENCOUNTER — Ambulatory Visit (HOSPITAL_BASED_OUTPATIENT_CLINIC_OR_DEPARTMENT_OTHER): Payer: Medicare Other | Admitting: Family Medicine

## 2020-01-03 ENCOUNTER — Other Ambulatory Visit (HOSPITAL_COMMUNITY)
Admission: RE | Admit: 2020-01-03 | Discharge: 2020-01-03 | Disposition: A | Discharge status: Home / Self Care | Payer: Medicare Other | Source: Ambulatory Visit | Attending: Family Medicine | Admitting: Family Medicine

## 2020-01-03 ENCOUNTER — Encounter: Payer: Self-pay | Admitting: Gastroenterology

## 2020-01-03 VITALS — BP 150/88 | HR 88 | Temp 97.9°F | Ht 66.0 in | Wt 135.2 lb

## 2020-01-03 DIAGNOSIS — M79604 Pain in right leg: Secondary | ICD-10-CM

## 2020-01-03 DIAGNOSIS — Z113 Encounter for screening for infections with a predominantly sexual mode of transmission: Secondary | ICD-10-CM

## 2020-01-03 DIAGNOSIS — N289 Disorder of kidney and ureter, unspecified: Secondary | ICD-10-CM | POA: Diagnosis not present

## 2020-01-03 DIAGNOSIS — I10 Essential (primary) hypertension: Secondary | ICD-10-CM | POA: Diagnosis not present

## 2020-01-03 DIAGNOSIS — M79605 Pain in left leg: Secondary | ICD-10-CM | POA: Diagnosis not present

## 2020-01-03 DIAGNOSIS — R159 Full incontinence of feces: Secondary | ICD-10-CM

## 2020-01-03 NOTE — Progress Notes (Signed)
Go over results  Cramps in legs and ankles sometimes feels like its on fire.  Per pt he's Having trouble with BM not wanting to move by its self  36.6 135.2lb

## 2020-01-03 NOTE — Telephone Encounter (Signed)
Given to patient when he came into office'

## 2020-01-03 NOTE — Progress Notes (Signed)
Established Patient Office Visit  Subjective:  Patient ID: Collin Young, male    DOB: Jun 28, 1951  Age: 69 y.o. MRN: [id number]  CC: No chief complaint on file.   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  Patient states that he wants to make sure that any infections have cleared up and that he has been tested for anything that might be sexually transmitted.  At his last visit, he was treated with ceftriaxone injection and azithromycin with a prescription for metronidazole to take at home.  Patient had urine cytology ancillary testing on 11/03/2019 which was positive for trichomonas.  At today's visit, patient denies any issues with burning with urination or urinary frequency.  He denies any abdominal or pelvic pain, no penile discharge and no penile/genital area lesions.  No scrotal/testicular pain.         He does have complaint of some recent issues with leakage of feces without awareness.  He has had no blood in the stool and no black stools.  No abdominal pain.  He also reports that he is having issues with his legs feeling tired/heavy and painful with walking.  Pain is increased when going uphill.  He states that he often goes to a local store and on the return trip, he has to walk uphill which causes him to have increased leg pain and he often has to stop and rest.  Pain sometimes occurs on the thighs but most often in the calves.  Patient no longer smokes but did smoke in the past.  Past Medical History:  Diagnosis Date  . Chronic combined systolic and diastolic CHF (congestive  heart failure) ([place])    followed by [name]  . COPD (chronic obstructive pulmonary disease) ([place])   . Coronary artery disease cardiologist-- [name]   per cardiac cath 01-05-2017  chronic total occlusion pLCx with collaterals, 99% severe calcified prox. to mid RCA, otherwise mild to moderate CAD (medically managed)  . Esophageal cancer, stage IIIB [facility]) oncologist-  dr ennever/  dr moody   dx 2010  SCC Stage IIIB completed chemoradiation;  localized recurrent left piriform sinus 01/ 2015,  completed concurrent chemoradiatoin 04/ 2015  . GERD (gastroesophageal reflux disease)    09-07-2018   no issues since Gtube removed 06/ 2019  . Headache   . History of alcohol abuse    quit 2001  . History of cancer chemotherapy    2010;   2015  . History of radiation therapy    10-19-2013 to  12-05-2013 pyriform sinus 69.96 Gy/69f;   Radiation completed 2010 for esophageal cancer  . History of seizure 2001   alcohol withdrawal  . HOH (hard of hearing)   . Hyperlipidemia   . Hypertension   . Ischemic cardiomyopathy 12/2016   01-03-2017  echo,  ef 10-15%/   echo 05-06-2017 EF improved to 45-50%  . Lower urinary tract symptoms (LUTS)   . Prostate cancer ([facility] urologist-  dr ottelin/  oncologist-- dr mTammi Klippel  dx 05-27-2018--- Stage T1c,  Gleason 4+3,  PSA 9.3--  scheduled for brachytherapy 09-23-2017  . Renal  cyst, left   . Voice hoarseness    secondary to radiation treatment    Past Surgical History:  Procedure Laterality Date  . CARDIOVASCULAR STRESS TEST  10/09/09   normal nuclearr stress test, EF 57% [name])  . IR GASTROSTOMY TUBE MOD SED  01/13/2017  . IR GASTROSTOMY TUBE REMOVAL  02/03/2018  . IR PATIENT EVAL TECH 0-60 MINS  03/25/2017  . IR REMOVAL TUN ACCESS W/ PORT W/O FL MOD SED  01/15/2017  . IR REPLACE G-TUBE SIMPLE WO FLUORO  01/13/2018  . LARYNGOSCOPY N/A 09/15/2013   Procedure: LARYNGOSCOPY;  Surgeon: [name], MD;  Location: [facility];  Service: ENT;   Laterality: N/A;  direct laryngoscopy with biopsy and esophagoscopy  . RADIOACTIVE SEED IMPLANT N/A 09/23/2018   Procedure: RADIOACTIVE SEED IMPLANT/BRACHYTHERAPY IMPLANT;  Surgeon: [name], MD;  Location: [facility];  Service: Urology;  Laterality: N/A;  . RIGHT/LEFT HEART CATH AND CORONARY ANGIOGRAPHY N/A 01/05/2017   Procedure: Right/Left Heart Cath and Coronary Angiography;  Surgeon: [name], MD;  Location: [facility];  Service: Cardiovascular;  Laterality: N/A;  . TRANSTHORACIC ECHOCARDIOGRAM  05/06/2017   ef 45-50%,  grade 1 diastolic dysfunction/  AV severe calcified non coronary cusp with moderate regurg. , no stenosis (valve area per echo 01-05-2017 1.08cm^2)/  mild MR, TR, and PR/  mild LAE    Family History  Problem Relation Age of Onset  . Breast cancer Mother   . Prostate cancer Neg Hx   . Colon cancer Neg Hx   . Pancreatic cancer Neg Hx     Social History   Socioeconomic History  . Marital status: Legally Separated    Spouse name: Not on file  . Number of children: 2  . Years of education: Not on file  . Highest education level: Not on file  Occupational History  . Occupation: retired  Tobacco Use  . Smoking status: Former Smoker    Packs/day: 1.00    Years: 40.00    Pack years: 40.00    Types: Cigarettes    Start date: 11/08/1960    Quit date: 08/31/2009    Years since quitting: 10.3  . Smokeless tobacco: Former Systems developer    Types: [place] date: 09/01/1999  Substance and Sexual Activity  . Alcohol use: Not Currently    Alcohol/week: 0.0 standard drinks    Comment: quit in 2001  . Drug use: No    Comment: back in the day used cocaine,alcohol, marijuana  . Sexual activity: Yes    Partners: Female    Birth control/protection: None  Other Topics Concern  . Not on file  Social History Narrative  . Not on file   Social Determinants of Health   Financial Resource Strain:   . Difficulty of Paying Living Expenses:    Food Insecurity:   . Worried About Charity fundraiser in the Last Year:   . Arboriculturist in the Last Year:   Transportation Needs:   . Film/video editor (Medical):   Marland Kitchen Lack of Transportation (Non-Medical):   Physical Activity:   . Days of Exercise per Week:   . Minutes of Exercise per Session:   Stress:   . Feeling of Stress :   Social Connections:   . Frequency of Communication with Friends and Family:   . Frequency of Social Gatherings with Friends and Family:   . Attends Religious Services:   . Active Member of Clubs  or Organizations:   . Attends Archivist Meetings:   Marland Kitchen Marital Status:   Intimate Partner Violence:   . Fear of Current or Ex-Partner:   . Emotionally Abused:   Marland Kitchen Physically Abused:   . Sexually Abused:     Outpatient Medications Prior to Visit  Medication Sig Dispense Refill  . aspirin EC 81 MG tablet Take 81 mg by mouth daily.    Marland Kitchen atorvastatin (LIPITOR) 80 MG tablet Take 1 tablet (80 mg total) by mouth daily. To lower cholesterol 30 tablet 11  . fentaNYL (DURAGESIC) 12 MCG/HR Place 1 patch onto the skin every 3 (three) days. 10 patch 0  . HYDROcodone-acetaminophen (NORCO) 10-325 MG tablet Take 1-2 tablets by mouth every 4 (four) hours as needed for moderate pain. Maximum dose per 24 hours - 8 pills 8 tablet 0  . metoprolol succinate (TOPROL-XL) 25 MG 24 hr tablet Take 0.5 tablets (12.5 mg total) by mouth daily. 45 tablet 3  . omeprazole (PRILOSEC) 20 MG capsule Take 1 capsule (20 mg total) by mouth daily. To reduce stomach acid, stomach protection 30 capsule 11  . sacubitril-valsartan (ENTRESTO) 24-26 MG Take 1 tablet by mouth 2 (two) times daily. 180 tablet 3  . tamsulosin (FLOMAX) 0.4 MG CAPS capsule Take 1 capsule (0.4 mg total) by mouth daily after supper. (Patient not taking: Reported on 01/15/2020) 30 capsule 6   Facility-Administered Medications Prior to Visit  Medication Dose Route Frequency Provider Last Rate Last Admin  . topical  emolient (BIAFINE) emulsion   Topical Daily Kyung Rudd, MD   Given at 01/19/14 [phone]    No Known Allergies  ROS Review of Systems  Constitutional: Positive for fatigue. Negative for chills and fever.  HENT: Negative for sore throat and trouble swallowing.   Respiratory: Negative for cough and shortness of breath.   Cardiovascular: Negative for chest pain and palpitations.  Gastrointestinal: Negative for abdominal pain, blood in stool, constipation, diarrhea and nausea.       Issues with fecal incontinence  Endocrine: Negative for cold intolerance, heat intolerance, polydipsia, polyphagia and polyuria.  Genitourinary: Negative for difficulty urinating, discharge, dysuria, flank pain, frequency, genital sores, penile swelling, scrotal swelling, testicular pain and urgency.  Musculoskeletal: Positive for arthralgias and gait problem.  Neurological: Negative for dizziness and headaches.  Hematological: Negative for adenopathy. Does not bruise/bleed easily.  Psychiatric/Behavioral: Negative for self-injury and suicidal ideas.      Objective:    Physical Exam  Constitutional: He is oriented to person, place, and time. He appears well-developed and well-nourished.  Thin framed older male with raspy/hoarse speaking voice in NAD wearing a mask as per office COVID-19 protocol  Cardiovascular: Normal rate and regular rhythm.  Peripheral pulses are difficult to palpate  Pulmonary/Chest: Effort normal.  Abdominal: Soft. There is no abdominal tenderness. There is no rebound and no guarding.  Musculoskeletal:     Comments: No CVA tenderness  Neurological: He is alert and oriented to person, place, and time.  Skin: Skin is warm and dry.  Psychiatric: He has a normal mood and affect. His behavior is normal.  Nursing note and vitals reviewed.   BP (!) 150/88   Pulse 88   Temp 97.9 F (36.6 C) (Temporal)   Ht '5\' 6"'  (1.676 m)   Wt 135 lb 3.2 oz (61.3 kg)   SpO2 97%   BMI 21.82 kg/m  Wt  Readings from Last 3 Encounters:  01/15/20 137 lb (62.1 kg)  01/03/20 135 lb 3.2 oz (  61.3 kg)  11/03/19 140 lb (63.5 kg)     Health Maintenance Due  Topic Date Due  . Hepatitis C Screening  Never done  . COVID-19 Vaccine (1) Never done  . COLONOSCOPY  Never done  . PNA vac Low Risk Adult (1 of 2 - PCV13) Never done     Lab Results  Component Value Date   TSH 2.199 11/11/2018   Lab Results  Component Value Date   WBC 4.3 10/19/2019   HGB 15.5 10/19/2019   HCT 49.1 10/19/2019   MCV 89.9 10/19/2019   PLT 205 10/19/2019   Lab Results  Component Value Date   NA 139 01/03/2020   K 4.7 01/03/2020   CHLORIDE 98 03/10/2017   CO2 26 01/03/2020   GLUCOSE 78 01/03/2020   BUN 19 01/03/2020   CREATININE 1.36 (H) 01/03/2020   BILITOT 0.4 10/19/2019   ALKPHOS 35 (L) 10/19/2019   AST 26 10/19/2019   ALT 22 10/19/2019   PROT 7.8 10/19/2019   ALBUMIN 4.1 10/19/2019   CALCIUM 9.9 01/03/2020   ANIONGAP 6 10/19/2019   EGFR 83 (L) 03/10/2017   Lab Results  Component Value Date   CHOL 230 (H) 01/03/2020   Lab Results  Component Value Date   HDL 72 01/03/2020   Lab Results  Component Value Date   LDLCALC 147 (H) 01/03/2020   Lab Results  Component Value Date   TRIG 64 01/03/2020   Lab Results  Component Value Date   CHOLHDL 3.2 01/03/2020   Lab Results  Component Value Date   HGBA1C 5.8 (H) 09/26/2018      Assessment & Plan:  1. Pain in both lower extremities Patient with symptoms suggestive of claudication/peripheral arterial disease.  He will be referred to vascular surgery for further evaluation of leg pain/leg fatigue with activity. - Ambulatory referral to Vascular Surgery  2. Incontinence of feces, unspecified fecal incontinence type GI referral and follow-up with patient's complaints of fecal incontinence.  Patient is on a fentanyl patch for chronic pain and may be having constipation with leakage of loose stool though patient does not feel as if he is  constipated. .- Ambulatory referral to Gastroenterology  3. Essential hypertension Patient has not eaten prior to today's visit therefore lipid panel will be done as patient is hypertensive which increases his risk of cardiovascular disease.  Additionally patient with probable peripheral arterial disease/claudication.  Patient will also have recheck of electrolytes due to renal insufficiency/chronic kidney disease.  Blood pressure remains suboptimally controlled.  Patient is to make sure that he is taking his blood pressure medication on a daily basis and schedule follow-up for blood pressure check or keep/schedule follow-up with cardiology as patient with heart failure. - Lipid panel  4. Screening for STD (sexually transmitted diseases) Patient's office visit note from 11/03/2019 was reviewed and patient had testing for sexually transmitted diseases through urine ancillary testing which was positive for trichomonas for which he received prescription for metronidazole at his last visit.  He denies any urinary symptoms at today's visit but due to concern regarding exposure to sexually transmitted disease that he would like further testing.  Will repeat urine cytology ancillary testing for chlamydia/gonorrhea/trichomonas and yeast infections but patient additionally was offered blood test for HIV and syphilis which he agreed to have done at today's visit.  He will be notified of the results and if further treatment is needed based on the results.  Safe sexual practices discussed. - Urine cytology ancillary only -  RPR - HIV antibody (with reflex)  5. Renal insufficiency/chronic kidney disease On review of chart, patient with creatinine of 1.36 on 10/19/2019 blood work done at the cancer center.  He will have basic metabolic panel at today's visit and follow-up of his renal insufficiency and again discussed the importance of being compliant with blood pressure medication and remaining hydrated. - Basic  Metabolic Panel   An After Visit Summary was printed and given to the patient.   Follow-up: Return in about 3 months (around 04/04/2020) for chronic issues-sooner if needed.    Approximately 35 minutes of face-to-face time was spent with the patient at today's visit as well as an additional 12 minutes for review of chart, and completion of today's visit note  Antony Blackbird, MD

## 2020-01-05 ENCOUNTER — Other Ambulatory Visit: Payer: Self-pay | Admitting: Family Medicine

## 2020-01-05 DIAGNOSIS — A530 Latent syphilis, unspecified as early or late: Secondary | ICD-10-CM

## 2020-01-05 LAB — LIPID PANEL
Chol/HDL Ratio: 3.2 ratio (ref 0.0–5.0)
Cholesterol, Total: 230 mg/dL — ABNORMAL HIGH (ref 100–199)
HDL: 72 mg/dL
LDL Chol Calc (NIH): 147 mg/dL — ABNORMAL HIGH (ref 0–99)
Triglycerides: 64 mg/dL (ref 0–149)
VLDL Cholesterol Cal: 11 mg/dL (ref 5–40)

## 2020-01-05 LAB — BASIC METABOLIC PANEL WITH GFR
BUN/Creatinine Ratio: 14 (ref 10–24)
BUN: 19 mg/dL (ref 8–27)
CO2: 26 mmol/L (ref 20–29)
Calcium: 9.9 mg/dL (ref 8.6–10.2)
Chloride: 100 mmol/L (ref 96–106)
Creatinine, Ser: 1.36 mg/dL — ABNORMAL HIGH (ref 0.76–1.27)
GFR calc Af Amer: 61 mL/min/1.73
GFR calc non Af Amer: 53 mL/min/1.73 — ABNORMAL LOW
Glucose: 78 mg/dL (ref 65–99)
Potassium: 4.7 mmol/L (ref 3.5–5.2)
Sodium: 139 mmol/L (ref 134–144)

## 2020-01-05 LAB — RPR, QUANT+TP ABS (REFLEX)
Rapid Plasma Reagin, Quant: 1:1 {titer} — ABNORMAL HIGH
T Pallidum Abs: REACTIVE — AB

## 2020-01-05 LAB — SYPHILIS: RPR W/REFLEX TO RPR TITER AND TREPONEMAL ANTIBODIES, TRADITIONAL SCREENING AND DIAGNOSIS ALGORITHM: RPR Ser Ql: REACTIVE — AB

## 2020-01-05 LAB — HIV ANTIBODY (ROUTINE TESTING W REFLEX): HIV Screen 4th Generation wRfx: NONREACTIVE

## 2020-01-05 NOTE — Progress Notes (Signed)
Patient ID: Collin Young, male   DOB: [birth year], 69 y.o.   MRN: [id number]   ID referral in follow-up of positive RPR and reactive T. Pallidium antibodies

## 2020-01-09 ENCOUNTER — Other Ambulatory Visit: Payer: Self-pay | Admitting: Family Medicine

## 2020-01-09 DIAGNOSIS — A599 Trichomoniasis, unspecified: Secondary | ICD-10-CM

## 2020-01-09 LAB — URINE CYTOLOGY ANCILLARY ONLY
Candida Urine: NEGATIVE
Chlamydia: NEGATIVE
Comment: NEGATIVE
Comment: NEGATIVE
Comment: NORMAL
Neisseria Gonorrhea: NEGATIVE
Trichomonas: POSITIVE — AB

## 2020-01-09 MED ORDER — METRONIDAZOLE 500 MG PO TABS: 500.0000 mg | ORAL_TABLET | Freq: Two times a day (BID) | ORAL | 0 refills | Status: DC

## 2020-01-15 ENCOUNTER — Other Ambulatory Visit: Payer: Self-pay

## 2020-01-15 ENCOUNTER — Ambulatory Visit (INDEPENDENT_AMBULATORY_CARE_PROVIDER_SITE_OTHER): Payer: Medicare Other | Admitting: Internal Medicine

## 2020-01-15 ENCOUNTER — Encounter: Payer: Self-pay | Admitting: Internal Medicine

## 2020-01-15 DIAGNOSIS — A528 Late syphilis, latent: Secondary | ICD-10-CM | POA: Insufficient documentation

## 2020-01-15 MED ORDER — PENICILLIN G BENZATHINE 1200000 UNIT/2ML IM SUSP
1.2000 10*6.[IU] | Freq: Once | INTRAMUSCULAR | Status: AC
  Administered 2020-01-15: 1.2 10*6.[IU] via INTRAMUSCULAR

## 2020-01-15 NOTE — Progress Notes (Signed)
[facility]      Reason for Consult: positive RPR    Referring Physician: Dr. Chapman Fitch    Patient ID: Collin Young, male    DOB: 1951-08-06, 69 y.o.   MRN: [id number]  HPI:   He was recently seen by his PCP for STI and treated for trichomonas. Screening for syphilis positive with a titer of 1:1 and positive confirmatory testing.  He has not had a recent rash, no penile lesion or oral lesions.  Has not used condoms with sexual activity.  No penile discharge.   Past Medical History:  Diagnosis Date  . Chronic combined systolic and diastolic CHF (congestive heart failure) ([place])    followed by [name]  . COPD (chronic obstructive pulmonary disease) ([place])   . Coronary artery disease cardiologist-- [name]   per cardiac cath 01-05-2017  chronic total occlusion pLCx with collaterals, 99% severe calcified prox. to mid RCA, otherwise mild to moderate CAD (medically managed)  . Esophageal cancer, stage IIIB [facility]) oncologist-  dr ennever/  dr moody   dx 2010  SCC Stage IIIB completed chemoradiation;  localized recurrent left piriform sinus 01/ 2015,  completed concurrent chemoradiatoin 04/ 2015  . GERD (gastroesophageal reflux disease)    09-07-2018   no issues since Gtube removed 06/ 2019  . Headache   . History of alcohol abuse    quit 2001  . History of cancer chemotherapy    2010;   2015  . History of radiation therapy    10-19-2013 to  12-05-2013 pyriform sinus 69.96 Gy/54fx;   Radiation completed 2010 for esophageal cancer  . History of seizure 2001   alcohol withdrawal  . HOH (hard of hearing)   . Hyperlipidemia   . Hypertension   . Ischemic cardiomyopathy 12/2016   01-03-2017  echo,  ef 10-15%/   echo 05-06-2017 EF improved to 45-50%  . Lower urinary tract symptoms (LUTS)   . Prostate cancer [facility]) urologist-  dr ottelin/  oncologist-- [name]   dx 05-27-2018--- Stage T1c,  Gleason 4+3,  PSA 9.3--  scheduled for brachytherapy  09-23-2017  . Renal cyst, left   . Voice hoarseness    secondary to radiation treatment    Prior to Admission medications   Medication Sig Start Date End Date Taking? Authorizing Provider  aspirin EC 81 MG tablet Take 81 mg by mouth daily.   Yes [provider]  atorvastatin (LIPITOR) 80 MG tablet Take 1 tablet (80 mg total) by mouth daily. To lower cholesterol 02/09/19  Yes Fulp, Cammie, MD  fentaNYL (DURAGESIC) 12 MCG/HR Place 1 patch onto the skin every 3 (three) days. 12/06/19  Yes Ennever, Rudell Cobb, MD  HYDROcodone-acetaminophen (NORCO) 10-325 MG tablet Take 1-2 tablets by mouth every 4 (four) hours as needed for moderate pain. Maximum dose per 24 hours - 8 pills 09/23/18  Yes [name], MD  metoprolol succinate (TOPROL-XL) 25 MG 24 hr tablet Take 0.5 tablets (12.5 mg total) by mouth daily. 07/05/19  Yes Kilroy, Luke K, PA-C  metroNIDAZOLE (FLAGYL) 500 MG tablet Take 1 tablet (500 mg total) by mouth 2 (two) times daily. 01/09/20  Yes Fulp, Cammie, MD  omeprazole (PRILOSEC) 20 MG capsule Take 1 capsule (20 mg total) by mouth daily. To reduce stomach acid, stomach protection 09/26/18  Yes Fulp, Cammie, MD  sacubitril-valsartan (ENTRESTO) 24-26 MG Take 1 tablet by mouth 2 (two) times daily. 05/25/19  Yes Skeet Latch, MD  tamsulosin [facility])  0.4 MG CAPS capsule Take 1 capsule (0.4 mg total) by mouth daily after supper. Patient not taking: Reported on 01/15/2020 02/15/19   Antony Blackbird, MD    No Known Allergies  Social History   Tobacco Use  . Smoking status: Former Smoker    Packs/day: 1.00    Years: 40.00    Pack years: 40.00    Types: Cigarettes    Start date: 11/08/1960    Quit date: 08/31/2009    Years since quitting: 10.3  . Smokeless tobacco: Former Systems developer    Types: Chew    Quit date: 09/01/1999  Substance Use Topics  . Alcohol use: Not Currently    Alcohol/week: 0.0 standard drinks    Comment: quit in 2001  . Drug use: No    Comment: back in the day used  cocaine,alcohol, marijuana    Family History  Problem Relation Age of Onset  . Breast cancer Mother   . Prostate cancer Neg Hx   . Colon cancer Neg Hx   . Pancreatic cancer Neg Hx     Review of Systems  Constitutional: negative for fevers and chills Genitourinary: negative for genital lesions and penile discharge Integument/breast: negative for rash All other systems reviewed and are negative    Constitutional: in no apparent distress  Vitals:   01/15/20 1500  BP: (!) 171/88  Pulse: 80  Temp: (!) 97.5 F (36.4 C)  SpO2: 98%   EYES: anicteric Musculoskeletal: no pedal edema noted Skin: negatives: no rash Neuro: non-focal   Labs: Lab Results  Component Value Date   WBC 4.3 10/19/2019   HGB 15.5 10/19/2019   HCT 49.1 10/19/2019   MCV 89.9 10/19/2019   PLT 205 10/19/2019    Lab Results  Component Value Date   CREATININE 1.36 (H) 01/03/2020   BUN 19 01/03/2020   NA 139 01/03/2020   K 4.7 01/03/2020   CL 100 01/03/2020   CO2 26 01/03/2020    Lab Results  Component Value Date   ALT 22 10/19/2019   AST 26 10/19/2019   ALKPHOS 35 (L) 10/19/2019   BILITOT 0.4 10/19/2019   INR 0.93 09/16/2018     Assessment: late latent syphilis.  See problem based charting.  Will give Bicillin x 3.

## 2020-01-15 NOTE — Assessment & Plan Note (Signed)
It is very possible that the positive result is from a new infection from some point.  I will treat him for late latent syphilis with no known previous test. He will receive Bicillin 2.4 million units weekly x 3.

## 2020-01-16 ENCOUNTER — Ambulatory Visit: Payer: Medicare Other | Attending: Family Medicine

## 2020-01-16 DIAGNOSIS — C12 Malignant neoplasm of pyriform sinus: Secondary | ICD-10-CM

## 2020-01-16 DIAGNOSIS — A530 Latent syphilis, unspecified as early or late: Secondary | ICD-10-CM

## 2020-01-16 DIAGNOSIS — E038 Other specified hypothyroidism: Secondary | ICD-10-CM

## 2020-01-17 LAB — RPR W/REFLEX TO TREPSURE: RPR: REACTIVE — AB

## 2020-01-17 LAB — RPR, QUANT: RPR, Quant: 1:1 {titer} — ABNORMAL HIGH

## 2020-01-22 ENCOUNTER — Ambulatory Visit (INDEPENDENT_AMBULATORY_CARE_PROVIDER_SITE_OTHER): Payer: Medicare Other | Admitting: *Deleted

## 2020-01-22 ENCOUNTER — Other Ambulatory Visit: Payer: Self-pay

## 2020-01-22 DIAGNOSIS — A528 Late syphilis, latent: Secondary | ICD-10-CM | POA: Diagnosis not present

## 2020-01-22 MED ORDER — PENICILLIN G BENZATHINE 1200000 UNIT/2ML IM SUSP
1.2000 10*6.[IU] | Freq: Once | INTRAMUSCULAR | Status: AC
  Administered 2020-01-22: 1.2 10*6.[IU] via INTRAMUSCULAR

## 2020-01-22 NOTE — Progress Notes (Signed)
RN offered condoms, advised patient to remain abstinent for 7-10 days after treatment, and that the health department would be following up with him to confirm treatment. Patient's questions answered to their satisfaction. Landis Gandy, RN

## 2020-01-25 ENCOUNTER — Other Ambulatory Visit: Payer: Self-pay

## 2020-01-25 ENCOUNTER — Ambulatory Visit (INDEPENDENT_AMBULATORY_CARE_PROVIDER_SITE_OTHER): Payer: Medicare Other | Admitting: Cardiology

## 2020-01-25 ENCOUNTER — Encounter: Payer: Self-pay | Admitting: Cardiology

## 2020-01-25 VITALS — BP 94/52 | HR 84 | Temp 97.3°F | Ht 66.0 in | Wt 134.0 lb

## 2020-01-25 DIAGNOSIS — I420 Dilated cardiomyopathy: Secondary | ICD-10-CM

## 2020-01-25 DIAGNOSIS — I255 Ischemic cardiomyopathy: Secondary | ICD-10-CM

## 2020-01-25 DIAGNOSIS — I739 Peripheral vascular disease, unspecified: Secondary | ICD-10-CM | POA: Diagnosis not present

## 2020-01-25 DIAGNOSIS — I2510 Atherosclerotic heart disease of native coronary artery without angina pectoris: Secondary | ICD-10-CM

## 2020-01-25 DIAGNOSIS — A528 Late syphilis, latent: Secondary | ICD-10-CM

## 2020-01-25 DIAGNOSIS — I5042 Chronic combined systolic (congestive) and diastolic (congestive) heart failure: Secondary | ICD-10-CM | POA: Diagnosis not present

## 2020-01-25 DIAGNOSIS — C12 Malignant neoplasm of pyriform sinus: Secondary | ICD-10-CM

## 2020-01-25 DIAGNOSIS — C61 Malignant neoplasm of prostate: Secondary | ICD-10-CM

## 2020-01-25 NOTE — Assessment & Plan Note (Signed)
EF 10-15% by echo May 2018- improved to 45-50% by echo Sept 2018

## 2020-01-25 NOTE — Assessment & Plan Note (Signed)
Dr Linus Salmons has treated him

## 2020-01-25 NOTE — Progress Notes (Signed)
Cardiology Office Note:    Date:  01/25/2020   ID:  Collin Young, DOB 10/07/1950, MRN [id number]  PCP:  Antony Blackbird, MD  Cardiologist:  Skeet Latch, MD  Electrophysiologist:  None   Referring MD: Antony Blackbird, MD   Chief Complaint  Patient presents with  . Follow-up    6 months.    History of Present Illness:    Collin Young is a 69 y.o. 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 He is s/p chemotherapy and radiation 2015 (remission). He was admitted 01/02/17 with shortness of breath and found to have a BNP of 2k and an EF of 10-15% by echo. In addition he had acute on chronic renal insufficiency.  He eventually underwent coronary angiogram which revealed severe 2V CAD- CTO of CFX and high grade RCA.  He was felt to be too ill for CABG.  PCI was an option if his renal function recovered and he was symptomatic.  Fortunately the patient has done well from a cardiac standpoint. His EF improved to 45-50% by echo Sept 2018.  He has been back to work Soil scientist.  He was diagnosed with prostate cancer in Dec 2019 and had seed implant Jan 2020. He recently was diagnosed with late latent syphilis and was treated by Dr Linus Salmons.   He is in the office today for routine follow up. He denies any chest pain or unusual dyspnea.  He's not really sure what medications he's taking.  A recent LDL was 147 -supposedly on Lipitor 80 mg. He did complain to Dr Chapman Fitch about leg (calf) pain when he is walking home.  Vascular evaluation was recommended but he says he doesn't have an appointment for this. I offered to get LEA dopplers for further evaluation and he would like this done.   Past Medical History:  Diagnosis Date  . Chronic combined systolic and diastolic CHF (congestive heart failure) ([place])    followed by [name]  . COPD (chronic obstructive pulmonary disease) ([place])   . Coronary artery disease cardiologist-- dr  Skeet Latch   per cardiac cath 01-05-2017  chronic total occlusion pLCx with collaterals, 99% severe calcified prox. to mid RCA, otherwise mild to moderate CAD (medically managed)  . Esophageal cancer, stage IIIB [facility]) oncologist-  dr ennever/  dr moody   dx 2010  SCC Stage IIIB completed chemoradiation;  localized recurrent left piriform sinus 01/ 2015,  completed concurrent chemoradiatoin 04/ 2015  . GERD (gastroesophageal reflux disease)    09-07-2018   no issues since Gtube removed 06/ 2019  . Headache   . History of alcohol abuse    quit 2001  . History of cancer chemotherapy    2010;   2015  . History of radiation therapy    10-19-2013 to  12-05-2013 pyriform sinus 69.96 Gy/49fx;   Radiation completed 2010 for esophageal cancer  . History of seizure 2001   alcohol withdrawal  . HOH (hard of hearing)   . Hyperlipidemia   . Hypertension   . Ischemic cardiomyopathy 12/2016   01-03-2017  echo,  ef 10-15%/   echo 05-06-2017 EF improved to 45-50%  . Lower urinary tract symptoms (LUTS)   . Prostate cancer [facility]) urologist-  dr ottelin/  oncologist-- [name]   dx 05-27-2018--- Stage T1c,  Gleason 4+3,  PSA 9.3--  scheduled for brachytherapy 09-23-2017  . Renal cyst, left   . Voice hoarseness  secondary to radiation treatment    Past Surgical History:  Procedure Laterality Date  . CARDIOVASCULAR STRESS TEST  10/09/09   normal nuclearr stress test, EF 57% [name])  . IR GASTROSTOMY TUBE MOD SED  01/13/2017  . IR GASTROSTOMY TUBE REMOVAL  02/03/2018  . IR PATIENT EVAL TECH 0-60 MINS  03/25/2017  . IR REMOVAL TUN ACCESS W/ PORT W/O FL MOD SED  01/15/2017  . IR REPLACE G-TUBE SIMPLE WO FLUORO  01/13/2018  . LARYNGOSCOPY N/A 09/15/2013   Procedure: LARYNGOSCOPY;  Surgeon: [name], MD;  Location: [facility];  Service: ENT;  Laterality: N/A;  direct laryngoscopy with biopsy and esophagoscopy  . RADIOACTIVE SEED IMPLANT N/A 09/23/2018   Procedure: RADIOACTIVE SEED IMPLANT/BRACHYTHERAPY  IMPLANT;  Surgeon: [name], MD;  Location: [facility];  Service: Urology;  Laterality: N/A;  . RIGHT/LEFT HEART CATH AND CORONARY ANGIOGRAPHY N/A 01/05/2017   Procedure: Right/Left Heart Cath and Coronary Angiography;  Surgeon: [name], MD;  Location: [facility];  Service: Cardiovascular;  Laterality: N/A;  . TRANSTHORACIC ECHOCARDIOGRAM  05/06/2017   ef 45-50%,  grade 1 diastolic dysfunction/  AV severe calcified non coronary cusp with moderate regurg. , no stenosis (valve area per echo 01-05-2017 1.08cm^2)/  mild MR, TR, and PR/  mild LAE    Current Medications: Current Meds  Medication Sig  . aspirin EC 81 MG tablet Take 81 mg by mouth daily.  Marland Kitchen atorvastatin (LIPITOR) 80 MG tablet Take 1 tablet (80 mg total) by mouth daily. To lower cholesterol  . fentaNYL (DURAGESIC) 12 MCG/HR Place 1 patch onto the skin every 3 (three) days.  Marland Kitchen HYDROcodone-acetaminophen (NORCO) 10-325 MG tablet Take 1-2 tablets by mouth every 4 (four) hours as needed for moderate pain. Maximum dose per 24 hours - 8 pills  . metoprolol succinate (TOPROL-XL) 25 MG 24 hr tablet Take 0.5 tablets (12.5 mg total) by mouth daily.  . metroNIDAZOLE (FLAGYL) 500 MG tablet Take 1 tablet (500 mg total) by mouth 2 (two) times daily.  Marland Kitchen omeprazole (PRILOSEC) 20 MG capsule Take 1 capsule (20 mg total) by mouth daily. To reduce stomach acid, stomach protection  . sacubitril-valsartan (ENTRESTO) 24-26 MG Take 1 tablet by mouth 2 (two) times daily.  . tamsulosin (FLOMAX) 0.4 MG CAPS capsule Take 1 capsule (0.4 mg total) by mouth daily after supper.     Allergies:   Patient has no known allergies.   Social History   Socioeconomic History  . Marital status: Legally Separated    Spouse name: Not on file  . Number of children: 2  . Years of education: Not on file  . Highest education level: Not on file  Occupational History  . Occupation: retired  Tobacco Use  . Smoking status: Former  Smoker    Packs/day: 1.00    Years: 40.00    Pack years: 40.00    Types: Cigarettes    Start date: 11/08/1960    Quit date: 08/31/2009    Years since quitting: 10.4  . Smokeless tobacco: Former Systems developer    Types: [place] date: 09/01/1999  Substance and Sexual Activity  . Alcohol use: Not Currently    Alcohol/week: 0.0 standard drinks    Comment: quit in 2001  . Drug use: No    Comment: back in the day used cocaine,alcohol, marijuana  . Sexual activity: Yes    Partners: Female    Birth control/protection: None  Other Topics Concern  . Not  on file  Social History Narrative  . Not on file   Social Determinants of Health   Financial Resource Strain:   . Difficulty of Paying Living Expenses:   Food Insecurity:   . Worried About Charity fundraiser in the Last Year:   . Arboriculturist in the Last Year:   Transportation Needs:   . Film/video editor (Medical):   Marland Kitchen Lack of Transportation (Non-Medical):   Physical Activity:   . Days of Exercise per Week:   . Minutes of Exercise per Session:   Stress:   . Feeling of Stress :   Social Connections:   . Frequency of Communication with Friends and Family:   . Frequency of Social Gatherings with Friends and Family:   . Attends Religious Services:   . Active Member of Clubs or Organizations:   . Attends Archivist Meetings:   Marland Kitchen Marital Status:      Family History: The patient's family history includes Breast cancer in his mother. There is no history of Prostate cancer, Colon cancer, or Pancreatic cancer.  ROS:   Please see the history of present illness.     All other systems reviewed and are negative.  EKGs/Labs/Other Studies Reviewed:    The following studies were reviewed today: Echo 05/06/2017- Study Conclusions   - Left ventricle: Diffuse hypokinesis slightly worse in inferior  wall. The cavity size was mildly dilated. Systolic function was  mildly reduced. The estimated ejection fraction was in the  range  of 45% to 50%. Doppler parameters are consistent with abnormal  left ventricular relaxation (grade 1 diastolic dysfunction).  - Aortic valve: Severely calcified non coronary cusp. There was  moderate regurgitation.  - Mitral valve: There was mild regurgitation.  - Left atrium: The atrium was mildly dilated.  - Atrial septum: No defect or patent foramen ovale was identified.   EKG:  EKG is ordered today.  The ekg ordered today demonstrates NSR, HR 84, QTc 465, lateral TWI  Recent Labs: 10/19/2019: ALT 22; Hemoglobin 15.5; Platelet Count 205 01/03/2020: BUN 19; Creatinine, Ser 1.36; Potassium 4.7; Sodium 139  Recent Lipid Panel    Component Value Date/Time   CHOL 230 (H) 01/03/2020 1502   TRIG 64 01/03/2020 1502   HDL 72 01/03/2020 1502   CHOLHDL 3.2 01/03/2020 1502   LDLCALC 147 (H) 01/03/2020 1502    Physical Exam:    VS:  BP (!) 94/52 (BP Location: Right Arm, Patient Position: Sitting, Cuff Size: Normal)   Pulse 84   Temp (!) 97.3 F (36.3 C)   Ht 5\' 6"  (1.676 m)   Wt 134 lb (60.8 kg)   BMI 21.63 kg/m     Wt Readings from Last 3 Encounters:  01/25/20 134 lb (60.8 kg)  01/15/20 137 lb (62.1 kg)  01/03/20 135 lb 3.2 oz (61.3 kg)     GEN:  Thin AA male, well developed in no acute distress HEENT: Normal NECK: No JVD; bilateral carotid bruits CARDIAC: RRR, 2/6 systolic murmur AOV, no rubs, gallops RESPIRATORY:  Clear to auscultation without rales, wheezing or rhonchi  ABDOMEN: Soft, non-tender, non-distended-mid line abdominal bruit MUSCULOSKELETAL:  No edema; No deformity, bilateral FA bruits with diminished distal pulses bilateral. No hair growth on toes or feet.   SKIN: Warm and dry NEUROLOGIC:  Alert and oriented x 3 PSYCHIATRIC:  Normal affect   ASSESSMENT:    CAD (coronary artery disease) High grade pRCA disease and CTO of CFX at cath May  2018- no PCI secondary to CRI and acute illness.  His EF did recover without intervention.   Congestive dilated  cardiomyopathy ([place]) EF 10-15% by echo May 2018- improved to 45-50% by echo Sept 2018  Claudication in peripheral vascular disease ([place]) Extensive PVD on exam- check LEA dopplers  Late latent syphilis Dr Linus Salmons has treated him  Malignant neoplasm of pyriform sinus [facility]) Required tube feeding till June 2019  Malignant neoplasm of prostate ([place]) S/P seed implant Jan 2020  PLAN:    Check LEA dopplers.  When he comes in for this he will see a pharmacist to review his medications.    Medication Adjustments/Labs and Tests Ordered: Current medicines are reviewed at length with the patient today.  Concerns regarding medicines are outlined above.  Orders Placed This Encounter  Procedures  . EKG 12-Lead  . VAS [place] LOWER EXTREMITY ARTERIAL DUPLEX  . VAS [place] ABI WITH/WO TBI   No orders of the defined types were placed in this encounter.   Patient Instructions  Medication Instructions:  Your physician recommends that you continue on your current medications as directed. Please refer to the Current Medication list given to you today.  *If you need a refill on your cardiac medications before your next appointment, please call your pharmacy*   Testing/Procedures: Your physician has requested that you have a lower extremity arterial duplex. During this test, ultrasound is used to evaluate arterial blood flow in the legs. Allow one hour for this exam. There are no restrictions or special instructions.  Your physician has requested that you have an ankle brachial index (ABI). During this test an ultrasound and blood pressure cuff are used to evaluate the arteries that supply the arms and legs with blood. Allow thirty minutes for this exam. There are no restrictions or special instructions.   Follow-Up: At [facility], you and your health needs are our priority.  As part of our continuing mission to provide you with exceptional heart care, we have created designated Provider Care Teams.   These Care Teams include your primary Cardiologist (physician) and Advanced Practice Providers (APPs -  Physician Assistants and Nurse Practitioners) who all work together to provide you with the care you need, when you need it.  We recommend signing up for the patient portal called "MyChart".  Sign up information is provided on this After Visit Summary.  MyChart is used to connect with patients for Virtual Visits (Telemedicine).  Patients are able to view lab/test results, encounter notes, upcoming appointments, etc.  Non-urgent messages can be sent to your provider as well.   To learn more about what you can do with MyChart, go to [email or web address].    Your next appointment:   6 month(s)  The format for your next appointment:   In Person  Provider:   You may see Skeet Latch, MD or one of the following Advanced Practice Providers on your designated Care Team:    Kerin Ransom, PA-C  [place], [place]  Coletta Memos, [place]   You have been scheduled for an [place] appointment with our Pharmacist to go over your medications on Tuesday, 01/30/20 AT 3:00 PM. Please bring your medications with you to this appointment.   Other Instructions Please call our office 2 months in advance (September) for your follow-up appointment with Dr. Oval Linsey in November.     Angelena Form, PA-C  01/25/2020 4:26 PM    [facility]

## 2020-01-25 NOTE — Assessment & Plan Note (Signed)
Required tube feeding till June 2019

## 2020-01-25 NOTE — Patient Instructions (Addendum)
Medication Instructions:  Your physician recommends that you continue on your current medications as directed. Please refer to the Current Medication list given to you today.  *If you need a refill on your cardiac medications before your next appointment, please call your pharmacy*   Testing/Procedures: Your physician has requested that you have a lower extremity arterial duplex. During this test, ultrasound is used to evaluate arterial blood flow in the legs. Allow one hour for this exam. There are no restrictions or special instructions.  Your physician has requested that you have an ankle brachial index (ABI). During this test an ultrasound and blood pressure cuff are used to evaluate the arteries that supply the arms and legs with blood. Allow thirty minutes for this exam. There are no restrictions or special instructions.   Follow-Up: At [facility], you and your health needs are our priority.  As part of our continuing mission to provide you with exceptional heart care, we have created designated Provider Care Teams.  These Care Teams include your primary Cardiologist (physician) and Advanced Practice Providers (APPs -  Physician Assistants and Nurse Practitioners) who all work together to provide you with the care you need, when you need it.  We recommend signing up for the patient portal called "MyChart".  Sign up information is provided on this After Visit Summary.  MyChart is used to connect with patients for Virtual Visits (Telemedicine).  Patients are able to view lab/test results, encounter notes, upcoming appointments, etc.  Non-urgent messages can be sent to your provider as well.   To learn more about what you can do with MyChart, go to [email or web address].    Your next appointment:   6 month(s)  The format for your next appointment:   In Person  Provider:   You may see Skeet Latch, MD or one of the following Advanced Practice Providers on your designated Care  Team:    Kerin Ransom, PA-C  [place], [place]  Coletta Memos,    You have been scheduled for an [place] appointment with our Pharmacist to go over your medications on Tuesday, 01/30/20 AT 3:00 PM. Please bring your medications with you to this appointment.   Other Instructions Please call our office 2 months in advance (September) for your follow-up appointment with Dr. Oval Linsey in November.

## 2020-01-25 NOTE — Assessment & Plan Note (Signed)
Extensive PVD on exam- check LEA dopplers

## 2020-01-25 NOTE — Assessment & Plan Note (Signed)
S/P seed implant Jan 2020

## 2020-01-25 NOTE — Assessment & Plan Note (Signed)
High grade pRCA disease and CTO of CFX at cath May 2018- no PCI secondary to CRI and acute illness.  His EF did recover without intervention.

## 2020-01-30 ENCOUNTER — Ambulatory Visit: Payer: Medicare Other

## 2020-01-30 ENCOUNTER — Other Ambulatory Visit: Payer: Self-pay

## 2020-01-30 ENCOUNTER — Ambulatory Visit (INDEPENDENT_AMBULATORY_CARE_PROVIDER_SITE_OTHER): Payer: Medicare Other

## 2020-01-30 ENCOUNTER — Ambulatory Visit (INDEPENDENT_AMBULATORY_CARE_PROVIDER_SITE_OTHER): Payer: Medicare Other | Admitting: Pharmacist

## 2020-01-30 VITALS — BP 132/70 | HR 70 | Ht 66.0 in | Wt 136.0 lb

## 2020-01-30 DIAGNOSIS — Z79899 Other long term (current) drug therapy: Secondary | ICD-10-CM | POA: Diagnosis not present

## 2020-01-30 DIAGNOSIS — E7800 Pure hypercholesterolemia, unspecified: Secondary | ICD-10-CM

## 2020-01-30 DIAGNOSIS — A539 Syphilis, unspecified: Secondary | ICD-10-CM | POA: Diagnosis not present

## 2020-01-30 MED ORDER — ATORVASTATIN CALCIUM 80 MG PO TABS: 80.0000 mg | ORAL_TABLET | Freq: Every day | ORAL | 2 refills | Status: DC

## 2020-01-30 MED ORDER — PENICILLIN G BENZATHINE 1200000 UNIT/2ML IM SUSP
1.2000 10*6.[IU] | Freq: Once | INTRAMUSCULAR | Status: AC
  Administered 2020-01-30: 1.2 10*6.[IU] via INTRAMUSCULAR

## 2020-01-30 NOTE — Patient Instructions (Addendum)
Return for a  follow up appointment AS NEEDED  Check your blood pressure at home daily (if able) and keep record of the readings.  Take your BP meds as follows: *NO MEDICATION CHANGES*  *PICK UP CHOLESTEROL MEDICATION FROM PHARMACY*  *Please call our office 2 months in advance (September) for your follow-up appointment with Dr. Oval Linsey in November*  Bring all of your meds, your BP cuff and your record of home blood pressures to your next appointment.  Exercise as you're able, try to walk approximately 30 minutes per day.  Keep salt intake to a minimum, especially watch canned and prepared boxed foods.  Eat more fresh fruits and vegetables and fewer canned items.  Avoid eating in fast food restaurants.    HOW TO TAKE YOUR BLOOD PRESSURE: . Rest 5 minutes before taking your blood pressure. .  Don't smoke or drink caffeinated beverages for at least 30 minutes before. . Take your blood pressure before (not after) you eat. . Sit comfortably with your back supported and both feet on the floor (don't cross your legs). . Elevate your arm to heart level on a table or a desk. . Use the proper sized cuff. It should fit smoothly and snugly around your bare upper arm. There should be enough room to slip a fingertip under the cuff. The bottom edge of the cuff should be 1 inch above the crease of the elbow. . Ideally, take 3 measurements at one sitting and record the average.

## 2020-01-30 NOTE — Progress Notes (Signed)
Patient ID: Collin Young                 DOB: August 25, 1951                      MRN: [id number]     HPI:  Collin Young is a 69 y.o. male patient of Dr Oval Linsey referred by Oswaldo Done PA-C to pharmacist clinic for medication reconciliation.  PMH includes CAD, HFrEF, claudication, latent syphilis, and malignant neoplasm of prostate. Patient was hypotensive during recent OV with [facility] PA, and was confused about medication therapy. He is here today to complete medication reconciliation and adjust therapy as needed.  Family History: The patient's family history includes Breast cancer in his mother. There is no history of Prostate cancer, Colon cancer, or Pancreatic cancer.  Social History: former smoker, former drug user, denier current drug or alcohol use  Exercise: activities of daily living  Home BP readings: none  Wt Readings from Last 3 Encounters:  01/30/20 136 lb (61.7 kg)  01/25/20 134 lb (60.8 kg)  01/15/20 137 lb (62.1 kg)   BP Readings from Last 3 Encounters:  01/30/20 132/70  01/25/20 (!) 94/52  01/15/20 (!) 171/88   Pulse Readings from Last 3 Encounters:  01/30/20 70  01/25/20 84  01/15/20 80     Past Medical History:  Diagnosis Date  . Chronic combined systolic and diastolic CHF (congestive heart failure) ([place])    followed by [name]  . COPD (chronic obstructive pulmonary disease) ([place])   . Coronary artery disease cardiologist-- [name]   per cardiac cath 01-05-2017  chronic total occlusion pLCx with collaterals, 99% severe calcified prox. to mid RCA, otherwise mild to moderate CAD (medically managed)  . Esophageal cancer, stage IIIB [facility]) oncologist-  dr ennever/  dr moody   dx 2010  SCC Stage IIIB completed chemoradiation;  localized recurrent left piriform sinus 01/ 2015,  completed concurrent chemoradiatoin 04/ 2015  . GERD (gastroesophageal reflux disease)    09-07-2018   no issues since Gtube removed 06/ 2019  . Headache   .  History of alcohol abuse    quit 2001  . History of cancer chemotherapy    2010;   2015  . History of radiation therapy    10-19-2013 to  12-05-2013 pyriform sinus 69.96 Gy/20fx;   Radiation completed 2010 for esophageal cancer  . History of seizure 2001   alcohol withdrawal  . HOH (hard of hearing)   . Hyperlipidemia   . Hypertension   . Ischemic cardiomyopathy 12/2016   01-03-2017  echo,  ef 10-15%/   echo 05-06-2017 EF improved to 45-50%  . Lower urinary tract symptoms (LUTS)   . Prostate cancer [facility]) urologist-  dr ottelin/  oncologist-- [name]   dx 05-27-2018--- Stage T1c,  Gleason 4+3,  PSA 9.3--  scheduled for brachytherapy 09-23-2017  . Renal cyst, left   . Voice hoarseness    secondary to radiation treatment    Current Outpatient Medications on File Prior to Visit  Medication Sig Dispense Refill  . aspirin EC 81 MG tablet Take 81 mg by mouth daily.    . fentaNYL (DURAGESIC) 12 MCG/HR Place 1 patch onto the skin every 3 (three) days. 10 patch 0  . metoprolol succinate (TOPROL-XL) 25 MG 24 hr tablet Take 0.5 tablets (12.5 mg total) by mouth daily. 45 tablet 3  . sacubitril-valsartan (ENTRESTO) 24-26 MG Take 1 tablet by mouth 2 (two)  times daily. 180 tablet 3  . HYDROcodone-acetaminophen (NORCO) 10-325 MG tablet Take 1-2 tablets by mouth every 4 (four) hours as needed for moderate pain. Maximum dose per 24 hours - 8 pills (Patient not taking: Reported on 01/30/2020) 8 tablet 0   Current Facility-Administered Medications on File Prior to Visit  Medication Dose Route Frequency Provider Last Rate Last Admin  . topical emolient (BIAFINE) emulsion   Topical Daily Kyung Rudd, MD   Given at 01/19/14 [phone]    No Known Allergies  Blood pressure 132/70, pulse 70, height 5\' 6"  (1.676 m), weight 136 lb (61.7 kg), SpO2 96 %.  Medication management Med list updated. Patient noted he was taking duplicate dose of Entresto by accident; therefore, he was experiencing hypotension.  Not  taking tamsulosin or omeprazole. Patient also stopped taking atorvastatin for unknown reason.   Will resume atorvastatin 80mg  daily, continue all other medication without changes and follow up with DR Oval Linsey in October. Plan to add spironolactone to therapy if BP and renal function remains appropriate.    Alfons Sulkowski Rodriguez-Guzman PharmD, BCPS, [place] [address] [place] [address] 02/01/2020 5:19 PM

## 2020-01-30 NOTE — Progress Notes (Signed)
Patient received # 3/3 bicillin injection today. Tolerated well. Condoms accepted. Chaperone present during medication administration.  Collin Young

## 2020-01-31 ENCOUNTER — Other Ambulatory Visit: Payer: Self-pay | Admitting: *Deleted

## 2020-01-31 DIAGNOSIS — R42 Dizziness and giddiness: Secondary | ICD-10-CM

## 2020-01-31 DIAGNOSIS — I739 Peripheral vascular disease, unspecified: Secondary | ICD-10-CM

## 2020-02-01 ENCOUNTER — Encounter: Payer: Self-pay | Admitting: Pharmacist

## 2020-02-01 NOTE — Assessment & Plan Note (Addendum)
Med list updated. Patient noted he was taking duplicate dose of Entresto by accident; therefore, he was experiencing hypotension.  Not taking tamsulosin or omeprazole. Patient also stopped taking atorvastatin for unknown reason.   Will resume atorvastatin 80mg  daily, continue all other medication without changes and follow up with DR Oval Linsey in October. Plan to add spironolactone to therapy if BP and renal function remains appropriate.

## 2020-02-02 ENCOUNTER — Ambulatory Visit (INDEPENDENT_AMBULATORY_CARE_PROVIDER_SITE_OTHER): Payer: Medicare Other | Admitting: Vascular Surgery

## 2020-02-02 ENCOUNTER — Other Ambulatory Visit: Payer: Self-pay

## 2020-02-02 ENCOUNTER — Ambulatory Visit (HOSPITAL_COMMUNITY)
Admission: RE | Admit: 2020-02-02 | Discharge: 2020-02-02 | Disposition: A | Discharge status: Home / Self Care | Payer: Medicare Other | Source: Ambulatory Visit | Attending: Vascular Surgery | Admitting: Vascular Surgery

## 2020-02-02 ENCOUNTER — Ambulatory Visit (INDEPENDENT_AMBULATORY_CARE_PROVIDER_SITE_OTHER)
Admission: RE | Admit: 2020-02-02 | Discharge: 2020-02-02 | Disposition: A | Discharge status: Home / Self Care | Payer: Medicare Other | Source: Ambulatory Visit | Attending: Vascular Surgery | Admitting: Vascular Surgery

## 2020-02-02 ENCOUNTER — Encounter: Payer: Self-pay | Admitting: Vascular Surgery

## 2020-02-02 VITALS — BP 152/82 | HR 73 | Temp 97.3°F | Resp 20 | Ht 66.0 in | Wt 134.4 lb

## 2020-02-02 DIAGNOSIS — I700 Atherosclerosis of aorta: Secondary | ICD-10-CM | POA: Diagnosis not present

## 2020-02-02 DIAGNOSIS — I739 Peripheral vascular disease, unspecified: Secondary | ICD-10-CM | POA: Insufficient documentation

## 2020-02-02 DIAGNOSIS — R42 Dizziness and giddiness: Secondary | ICD-10-CM | POA: Insufficient documentation

## 2020-02-02 NOTE — Progress Notes (Signed)
Patient ID: Collin Young, male   DOB: Nov 11, 1950, 69 y.o.   MRN: [id number]  Reason for Consult: New Patient (Initial Visit)   Referred by Antony Blackbird, MD  Subjective:     HPI:  Collin Young is a 68 y.o. male history of previous esophageal cancer as well as prostate cancer.  Also has COPD and history of congestive heart failure.  He was a previous smoker but did quit.  He continues to work doing hard work.  He states with walking he gets pain worse in his right leg than his left.  He is able to walk about as much as he needs to the pain mostly in his right calf sometimes in his bilateral hips.  This usually eases off if he stops and rests for 2 to 3 minutes.  He denies any tissue loss or ulceration.  Denies any family history of aneurysms.  He does not have any personal history of stroke, TIA or amaurosis.  Past Medical History:  Diagnosis Date  . Chronic combined systolic and diastolic CHF (congestive heart failure) ([place])    followed by [name]  . COPD (chronic obstructive pulmonary disease) ([place])   . Coronary artery disease cardiologist-- [name]   per cardiac cath 01-05-2017  chronic total occlusion pLCx with collaterals, 99% severe calcified prox. to mid RCA, otherwise mild to moderate CAD (medically managed)  . Esophageal cancer, stage IIIB [facility]) oncologist-  dr ennever/  dr moody   dx 2010  SCC Stage IIIB completed chemoradiation;  localized recurrent left piriform sinus 01/ 2015,  completed concurrent chemoradiatoin 04/ 2015  . GERD (gastroesophageal reflux disease)    09-07-2018   no issues since Gtube removed 06/ 2019  . Headache   . History of alcohol abuse    quit 2001  . History of cancer chemotherapy    2010;   2015  . History of radiation therapy    10-19-2013 to  12-05-2013 pyriform sinus 69.96 Gy/4fx;   Radiation completed 2010 for esophageal cancer  . History of seizure 2001   alcohol withdrawal  . HOH (hard of hearing)   .  Hyperlipidemia   . Hypertension   . Ischemic cardiomyopathy 12/2016   01-03-2017  echo,  ef 10-15%/   echo 05-06-2017 EF improved to 45-50%  . Lower urinary tract symptoms (LUTS)   . Prostate cancer [facility]) urologist-  dr ottelin/  oncologist-- [name]   dx 05-27-2018--- Stage T1c,  Gleason 4+3,  PSA 9.3--  scheduled for brachytherapy 09-23-2017  . Renal cyst, left   . Voice hoarseness    secondary to radiation treatment   Family History  Problem Relation Age of Onset  . Breast cancer Mother   . Prostate cancer Neg Hx   . Colon cancer Neg Hx   . Pancreatic cancer Neg Hx    Past Surgical History:  Procedure Laterality Date  . CARDIOVASCULAR STRESS TEST  10/09/09   normal nuclearr stress test, EF 57% [name])  . IR GASTROSTOMY TUBE MOD SED  01/13/2017  . IR GASTROSTOMY TUBE REMOVAL  02/03/2018  . IR PATIENT EVAL TECH 0-60 MINS  03/25/2017  . IR REMOVAL TUN ACCESS W/ PORT W/O FL MOD SED  01/15/2017  . IR REPLACE G-TUBE SIMPLE WO FLUORO  01/13/2018  . LARYNGOSCOPY N/A 09/15/2013   Procedure: LARYNGOSCOPY;  Surgeon: [name], MD;  Location: [place];  Service: ENT;  Laterality: N/A;  direct laryngoscopy with biopsy and esophagoscopy  .  RADIOACTIVE SEED IMPLANT N/A 09/23/2018   Procedure: RADIOACTIVE SEED IMPLANT/BRACHYTHERAPY IMPLANT;  Surgeon: [name], MD;  Location: [facility];  Service: Urology;  Laterality: N/A;  . RIGHT/LEFT HEART CATH AND CORONARY ANGIOGRAPHY N/A 01/05/2017   Procedure: Right/Left Heart Cath and Coronary Angiography;  Surgeon: [name], MD;  Location: [place] [facility];  Service: Cardiovascular;  Laterality: N/A;  . TRANSTHORACIC ECHOCARDIOGRAM  05/06/2017   ef 45-50%,  grade 1 diastolic dysfunction/  AV severe calcified non coronary cusp with moderate regurg. , no stenosis (valve area per echo 01-05-2017 1.08cm^2)/  mild MR, TR, and PR/  mild LAE    Short Social History:  Social History   Tobacco Use  . Smoking status: Former  Smoker    Packs/day: 1.00    Years: 40.00    Pack years: 40.00    Types: Cigarettes    Start date: 11/08/1960    Quit date: 08/31/2009    Years since quitting: 10.4  . Smokeless tobacco: Former Systems developer    Types: Chew    Quit date: 09/01/1999  Substance Use Topics  . Alcohol use: Not Currently    Alcohol/week: 0.0 standard drinks    Comment: quit in 2001    No Known Allergies  Current Outpatient Medications  Medication Sig Dispense Refill  . aspirin EC 81 MG tablet Take 81 mg by mouth daily.    Marland Kitchen atorvastatin (LIPITOR) 80 MG tablet Take 1 tablet (80 mg total) by mouth daily. To lower cholesterol 90 tablet 2  . fentaNYL (DURAGESIC) 12 MCG/HR Place 1 patch onto the skin every 3 (three) days. 10 patch 0  . metoprolol succinate (TOPROL-XL) 25 MG 24 hr tablet Take 0.5 tablets (12.5 mg total) by mouth daily. 45 tablet 3  . sacubitril-valsartan (ENTRESTO) 24-26 MG Take 1 tablet by mouth 2 (two) times daily. 180 tablet 3  . HYDROcodone-acetaminophen (NORCO) 10-325 MG tablet Take 1-2 tablets by mouth every 4 (four) hours as needed for moderate pain. Maximum dose per 24 hours - 8 pills (Patient not taking: Reported on 02/02/2020) 8 tablet 0   No current facility-administered medications for this visit.   Facility-Administered Medications Ordered in Other Visits  Medication Dose Route Frequency Provider Last Rate Last Admin  . topical emolient (BIAFINE) emulsion   Topical Daily Kyung Rudd, MD   Given at 01/19/14 7619    Review of Systems  Constitutional:  Constitutional negative. HENT: HENT negative.  Eyes: Eyes negative.  Respiratory: Respiratory negative.  Cardiovascular: Positive for claudication.  GI: Gastrointestinal negative.  Musculoskeletal: Positive for leg pain.  Skin: Skin negative.  Neurological: Neurological negative. Hematologic: Hematologic/lymphatic negative.  Psychiatric: Psychiatric negative.        Objective:  Objective   Vitals:   02/02/20 0908  BP: (!) 152/82    Pulse: 73  Resp: 20  Temp: (!) 97.3 F (36.3 C)  SpO2: 96%  Weight: 134 lb 6.4 oz (61 kg)  Height: 5\' 6"  (1.676 m)   Body mass index is 21.69 kg/m.  Physical Exam HENT:     Head: Normocephalic.     Nose: Nose normal.  Eyes:     Pupils: Pupils are equal, round, and reactive to light.  Neck:     Vascular: No carotid bruit.  Cardiovascular:     Rate and Rhythm: Normal rate.     Pulses:          Femoral pulses are 1+ on the right side and 1+ on the left side.  Popliteal pulses are 0 on the right side and 0 on the left side.  Abdominal:     General: Abdomen is flat.     Palpations: Abdomen is soft. There is no mass.  Musculoskeletal:        General: No swelling. Normal range of motion.     Cervical back: Rigidity present.  Skin:    General: Skin is warm and dry.     Capillary Refill: Capillary refill takes 2 to 3 seconds.  Neurological:     General: No focal deficit present.     Mental Status: He is alert.  Psychiatric:        Mood and Affect: Mood normal.        Thought Content: Thought content normal.        Judgment: Judgment normal.     Data: I have independently turbid his ABIs to be 0.75 and monophasic on the right with toe pressure of 65 and on the left is biphasic 0.85 with toe pressure of 75.     Assessment/Plan:     69 year old male with previous history of congestive heart failure with bilateral lower extremity pain right greater than left in his calf also with bilateral hip pain.  ABIs are worse and monophasic on the right relative to the left.  Patient states this is only mildly limiting to his lifestyle and he continues to work daily and is very active.  In reviewing his previous CT scan he definitely has aortic calcifications likely has some component of aortoiliac occlusive disease as well as femoral-popliteal occlusive disease but does not have any wounds at this time.  I discussed with the patient continued walking and if he has worse issues we can  consider angiography from the left common femoral approach.  He will otherwise follow-up in 1 year.  Patient is very satisfied with this plan.     Waynetta Sandy MD [facility]

## 2020-02-05 ENCOUNTER — Ambulatory Visit (INDEPENDENT_AMBULATORY_CARE_PROVIDER_SITE_OTHER): Payer: Medicare Other | Admitting: Gastroenterology

## 2020-02-05 ENCOUNTER — Encounter: Payer: Self-pay | Admitting: Gastroenterology

## 2020-02-05 DIAGNOSIS — R159 Full incontinence of feces: Secondary | ICD-10-CM

## 2020-02-05 DIAGNOSIS — R194 Change in bowel habit: Secondary | ICD-10-CM

## 2020-02-05 MED ORDER — CITRUCEL PO POWD
1.0000 | Freq: Every day | ORAL | Status: DC
Start: 2020-02-05 — End: 2020-05-09

## 2020-02-05 MED ORDER — SUPREP BOWEL PREP KIT 17.5-3.13-1.6 GM/177ML PO SOLN: 1.0000 | ORAL | 0 refills | Status: DC

## 2020-02-05 NOTE — Progress Notes (Signed)
HPI: This is a very pleasant 69 year old man who was referred by his primary care physician Dr. Chapman Fitch for difficulty with his bowels.  He describes leakage with bowel bowel movements for about a year now.  He has not seen any overt GI bleeding.  His symptoms of loose stools.  He does not really have trouble with constipation or severe diarrhea.  He believes the bowel changes started after is prostate surgery about a year ago.  He has had esophagus cancer and also piriform sinus cancer.  These were 2 separate cancers.  Both squamous cell.  Both treated with chemotherapy and radiation.    He has had G-tube feeding in the past.  I think his G-tube was removed about 2 years ago.  Amazingly after all of the neck and esophageal cancer and cancer treatments he is eating quite well.  He eats regular food without significant dysphagia.  He does think he has had a colonoscopy.  Sometime in the last 8 to 10 years.  He has not sure of who did it but he thinks it was done at [facility].  I cannot find any records of that in the epic system.  I do see that he was attached to Dr. Leonie Douglas from [facility] at one point.   Old Data Reviewed:  He was diagnosed with esophagus squamous cell carcinoma, stage IIIb around 2010.  I think this was diagnosed by Dr. Leonie Douglas at [facility] gastroenterology.  He underwent chemotherapy and radiation for it.  He never had surgery.  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  I believe he underwent chemotherapy and radiation for this as well.  He has been under the care of oncologist Dr. Lattie Haw as well as radiation therapy Dr. Lisbeth Renshaw.  I believe he has been under the care of Dr. Leonie Douglas as well from gastroenterology at [facility].  He was recently found to have latent syphilis.  He is under the care of factious disease for this.  Blood work February 2021 shows creatinine 1.4 which is around his usual, normal liver tests.   Normal CBC.  Echocardiogram September 2018 shows a left ventricular ejection fraction of 45 to 50%.  Review of systems: Pertinent positive and negative review of systems were noted in the above HPI section. All other review negative.   Past Medical History:  Diagnosis Date  . Chronic combined systolic and diastolic CHF (congestive heart failure) ([place])    followed by [name]  . COPD (chronic obstructive pulmonary disease) ([place])   . Coronary artery disease cardiologist-- [name]   per cardiac cath 01-05-2017  chronic total occlusion pLCx with collaterals, 99% severe calcified prox. to mid RCA, otherwise mild to moderate CAD (medically managed)  . Esophageal cancer, stage IIIB [facility]) oncologist-  dr ennever/  dr moody   dx 2010  SCC Stage IIIB completed chemoradiation;  localized recurrent left piriform sinus 01/ 2015,  completed concurrent chemoradiatoin 04/ 2015  . GERD (gastroesophageal reflux disease)    09-07-2018   no issues since Gtube removed 06/ 2019  . Headache   . History of alcohol abuse    quit 2001  . History of cancer chemotherapy    2010;   2015  . History of radiation therapy    10-19-2013 to  12-05-2013 pyriform sinus 69.96 Gy/31fx;   Radiation completed 2010 for esophageal cancer  . History of seizure 2001   alcohol withdrawal  .  HOH (hard of hearing)   . Hyperlipidemia   . Hypertension   . Ischemic cardiomyopathy 12/2016   01-03-2017  echo,  ef 10-15%/   echo 05-06-2017 EF improved to 45-50%  . Lower urinary tract symptoms (LUTS)   . Prostate cancer [facility]) urologist-  dr ottelin/  oncologist-- [name]   dx 05-27-2018--- Stage T1c,  Gleason 4+3,  PSA 9.3--  scheduled for brachytherapy 09-23-2017  . Renal cyst, left   . Voice hoarseness    secondary to radiation treatment    Past Surgical History:  Procedure Laterality Date  . CARDIOVASCULAR STRESS TEST  10/09/09   normal nuclearr stress test, EF 57% [name])  . IR GASTROSTOMY TUBE MOD  SED  01/13/2017  . IR GASTROSTOMY TUBE REMOVAL  02/03/2018  . IR PATIENT EVAL TECH 0-60 MINS  03/25/2017  . IR REMOVAL TUN ACCESS W/ PORT W/O FL MOD SED  01/15/2017  . IR REPLACE G-TUBE SIMPLE WO FLUORO  01/13/2018  . LARYNGOSCOPY N/A 09/15/2013   Procedure: LARYNGOSCOPY;  Surgeon: [name], MD;  Location: [place];  Service: ENT;  Laterality: N/A;  direct laryngoscopy with biopsy and esophagoscopy  . RADIOACTIVE SEED IMPLANT N/A 09/23/2018   Procedure: RADIOACTIVE SEED IMPLANT/BRACHYTHERAPY IMPLANT;  Surgeon: [name], MD;  Location: [facility];  Service: Urology;  Laterality: N/A;  . RIGHT/LEFT HEART CATH AND CORONARY ANGIOGRAPHY N/A 01/05/2017   Procedure: Right/Left Heart Cath and Coronary Angiography;  Surgeon: [name], MD;  Location: [place] [facility];  Service: Cardiovascular;  Laterality: N/A;  . TRANSTHORACIC ECHOCARDIOGRAM  05/06/2017   ef 45-50%,  grade 1 diastolic dysfunction/  AV severe calcified non coronary cusp with moderate regurg. , no stenosis (valve area per echo 01-05-2017 1.08cm^2)/  mild MR, TR, and PR/  mild LAE    Current Outpatient Medications  Medication Sig Dispense Refill  . aspirin EC 81 MG tablet Take 81 mg by mouth daily.    Marland Kitchen atorvastatin (LIPITOR) 80 MG tablet Take 1 tablet (80 mg total) by mouth daily. To lower cholesterol 90 tablet 2  . fentaNYL (DURAGESIC) 12 MCG/HR Place 1 patch onto the skin every 3 (three) days. 10 patch 0  . HYDROcodone-acetaminophen (NORCO) 10-325 MG tablet Take 1-2 tablets by mouth every 4 (four) hours as needed for moderate pain. Maximum dose per 24 hours - 8 pills 8 tablet 0  . metoprolol succinate (TOPROL-XL) 25 MG 24 hr tablet Take 0.5 tablets (12.5 mg total) by mouth daily. 45 tablet 3  . sacubitril-valsartan (ENTRESTO) 24-26 MG Take 1 tablet by mouth 2 (two) times daily. (Patient taking differently: Take 1 tablet by mouth daily. ) 180 tablet 3   No current facility-administered medications for this  visit.   Facility-Administered Medications Ordered in Other Visits  Medication Dose Route Frequency Provider Last Rate Last Admin  . topical emolient (BIAFINE) emulsion   Topical Daily Kyung Rudd, MD   Given at 01/19/14 0912    Allergies as of 02/05/2020  . (No Known Allergies)    Family History  Problem Relation Age of Onset  . Breast cancer Mother   . Prostate cancer Neg Hx   . Colon cancer Neg Hx   . Pancreatic cancer Neg Hx     Social History   Socioeconomic History  . Marital status: Legally Separated    Spouse name: Not on file  . Number of children: 2  . Years of education: Not on file  . Highest education level: Not on  file  Occupational History  . Occupation: retired  Tobacco Use  . Smoking status: Former Smoker    Packs/day: 1.00    Years: 40.00    Pack years: 40.00    Types: Cigarettes    Start date: 11/08/1960    Quit date: 08/31/2009    Years since quitting: 10.4  . Smokeless tobacco: Former Systems developer    Types: [place] date: 09/01/1999  Substance and Sexual Activity  . Alcohol use: Not Currently    Alcohol/week: 0.0 standard drinks    Comment: quit in 2001  . Drug use: No    Comment: back in the day used cocaine,alcohol, marijuana  . Sexual activity: Yes    Partners: Female    Birth control/protection: None  Other Topics Concern  . Not on file  Social History Narrative  . Not on file   Social Determinants of Health   Financial Resource Strain:   . Difficulty of Paying Living Expenses:   Food Insecurity:   . Worried About Charity fundraiser in the Last Year:   . Arboriculturist in the Last Year:   Transportation Needs:   . Film/video editor (Medical):   Marland Kitchen Lack of Transportation (Non-Medical):   Physical Activity:   . Days of Exercise per Week:   . Minutes of Exercise per Session:   Stress:   . Feeling of Stress :   Social Connections:   . Frequency of Communication with Friends and Family:   . Frequency of Social Gatherings with  Friends and Family:   . Attends Religious Services:   . Active Member of Clubs or Organizations:   . Attends Archivist Meetings:   Marland Kitchen Marital Status:   Intimate Partner Violence:   . Fear of Current or Ex-Partner:   . Emotionally Abused:   Marland Kitchen Physically Abused:   . Sexually Abused:      Physical Exam: Ht 5\' 6"  (1.676 m)   Wt 134 lb 9.6 oz (61.1 kg)   BMI 21.73 kg/m  Constitutional: generally well-appearing Psychiatric: alert and oriented x3 Eyes: extraocular movements intact Mouth: oral pharynx moist, no lesions Neck: supple no lymphadenopathy Cardiovascular: heart regular rate and rhythm Lungs: clear to auscultation bilaterally Abdomen: soft, nontender, nondistended, no obvious ascites, no peritoneal signs, normal bowel sounds Extremities: no lower extremity edema bilaterally Skin: no lesions on visible extremities   Assessment and plan: 69 y.o. male with bowel leakage, fecal incontinence  We will try to track down his previous colonoscopy.  I think he was a patient of Dr. Leonie Douglas at [facility] at 1 point and we will call over there to see if we get any results sent here for review.  He has certainly noticed a change in his bowels in the past year.  He attributes this to starting around the time of prostate surgery about a year ago.  He has fecal incontinence.  I am going to put him on fiber supplements to see if bulking up his stools will help with that and I recommended a repeat colonoscopy at his soonest convenience.  Quite amazed that after he has had head and neck squamous cancer and also esophageal squamous cancer he looks as good as he does.  After those cancers and radiation treatments for those cancers as well as chemotherapy he is doing remarkably well.  He eats without dysphagia    Please see the "Patient Instructions" section for addition details about the plan.   Collin Young  Ardis Hughs, MD [facility] 02/05/2020, 3:20 PM  Cc: Antony Blackbird,  MD  Total time on date of encounter was 45  minutes (this included time spent preparing to see the patient reviewing records; obtaining and/or reviewing separately obtained history; performing a medically appropriate exam and/or evaluation; counseling and educating the patient and family if present; ordering medications, tests or procedures if applicable; and documenting clinical information in the health record).

## 2020-02-05 NOTE — Patient Instructions (Addendum)
If you are age 69 or older, your body mass index should be between 23-30. Your Body mass index is 21.73 kg/m. If this is out of the aforementioned range listed, please consider follow up with your Primary Care Provider.  If you are age 69 or younger, your body mass index should be between 19-25. Your Body mass index is 21.73 kg/m. If this is out of the aformentioned range listed, please consider follow up with your Primary Care Provider.   You have been scheduled for a colonoscopy. Please follow written instructions given to you at your visit today.  Please pick up your prep supplies at the pharmacy within the next 1-3 days. If you use inhalers (even only as needed), please bring them with you on the day of your procedure.  Due to recent changes in healthcare laws, you may see the results of your imaging and laboratory studies on MyChart before your provider has had a chance to review them.  We understand that in some cases there may be results that are confusing or concerning to you. Not all laboratory results come back in the same time frame and the provider may be waiting for multiple results in order to interpret others.  Please give [place] 48 hours in order for your provider to thoroughly review all the results before contacting the office for clarification of your results.   Please purchase the following medications over the counter and take as directed:  Please start taking citrucel (orange flavored) powder fiber supplement.  This may cause some bloating at first but that usually goes away. Begin with a small spoonful and work your way up to a large, heaping spoonful daily over a week.  We will obtain your GI records from [facility] for review.  Thank you for entrusting me with your care and choosing [facility].  Dr Ardis Hughs

## 2020-02-06 ENCOUNTER — Ambulatory Visit (HOSPITAL_COMMUNITY)
Admission: RE | Admit: 2020-02-06 | Payer: Medicare Other | Source: Ambulatory Visit | Attending: Cardiology | Admitting: Cardiology

## 2020-02-13 ENCOUNTER — Telehealth: Payer: Self-pay | Admitting: Gastroenterology

## 2020-02-13 ENCOUNTER — Other Ambulatory Visit: Payer: Self-pay

## 2020-02-13 ENCOUNTER — Encounter: Payer: Self-pay | Admitting: Gastroenterology

## 2020-02-13 ENCOUNTER — Ambulatory Visit (AMBULATORY_SURGERY_CENTER): Payer: Medicare Other | Admitting: Gastroenterology

## 2020-02-13 VITALS — BP 107/67 | HR 70 | Temp 97.5°F | Resp 16 | Ht 66.0 in | Wt 134.0 lb

## 2020-02-13 DIAGNOSIS — D122 Benign neoplasm of ascending colon: Secondary | ICD-10-CM | POA: Diagnosis not present

## 2020-02-13 DIAGNOSIS — K529 Noninfective gastroenteritis and colitis, unspecified: Secondary | ICD-10-CM | POA: Diagnosis not present

## 2020-02-13 DIAGNOSIS — R194 Change in bowel habit: Secondary | ICD-10-CM

## 2020-02-13 MED ORDER — SODIUM CHLORIDE 0.9 % IV SOLN
500.0000 mL | Freq: Once | INTRAVENOUS | Status: DC
Start: 2020-02-13 — End: 2020-02-13

## 2020-02-13 NOTE — Op Note (Signed)
Collin Young Patient Name: Collin Young Procedure Date: 02/13/2020 11:17 AM MRN: [id number] Endoscopist: Milus Banister , MD Age: 69 Referring MD:  Date of Birth: Feb 22, 1951 Gender: Male Account #: [id number] Procedure:                Colonoscopy Indications:              Change in bowel habits, loose stools, intermittent                            fecal incontinence Medicines:                Monitored Anesthesia Care Procedure:                Pre-Anesthesia Assessment:                           - Prior to the procedure, a History and Physical                            was performed, and patient medications and                            allergies were reviewed. The patient's tolerance of                            previous anesthesia was also reviewed. The risks                            and benefits of the procedure and the sedation                            options and risks were discussed with the patient.                            All questions were answered, and informed consent                            was obtained. Prior Anticoagulants: The patient has                            taken no previous anticoagulant or antiplatelet                            agents. ASA Grade Assessment: III - A patient with                            severe systemic disease. After reviewing the risks                            and benefits, the patient was deemed in                            satisfactory condition to undergo the procedure.  After obtaining informed consent, the colonoscope                            was passed under direct vision. Throughout the                            procedure, the patient's blood pressure, pulse, and                            oxygen saturations were monitored continuously. The                            Colonoscope was introduced through the anus and                            advanced to the the cecum, identified  by                            appendiceal orifice and ileocecal valve. The                            colonoscopy was performed without difficulty. The                            patient tolerated the procedure well. The quality                            of the bowel preparation was good. The ileocecal                            valve, appendiceal orifice, and rectum were                            photographed. Scope In: 11:21:51 AM Scope Out: 11:36:45 AM Scope Withdrawal Time: 0 hours 8 minutes 52 seconds  Total Procedure Duration: 0 hours 14 minutes 54 seconds  Findings:                 Multiple large-mouthed diverticula were found in                            the entire colon.                           A 3 mm polyp was found in the ascending colon. The                            polyp was sessile. The polyp was removed with a                            cold snare. Resection and retrieval were complete.                           The exam was otherwise without abnormality on  direct and retroflexion views.                           Biopsies for histology were taken with a cold                            forceps from the entire colon for evaluation of                            microscopic colitis. Complications:            No immediate complications. Estimated blood loss:                            None. Estimated Blood Loss:     Estimated blood loss: none. Impression:               - Diverticulosis in the entire examined colon.                           - One 3 mm polyp in the ascending colon, removed                            with a cold snare. Resected and retrieved.                           - The examination was otherwise normal on direct                            and retroflexion views.                           - Biopsies were taken with a cold forceps from the                            entire colon for evaluation of microscopic  colitis. Recommendation:           - Patient has a contact number available for                            emergencies. The signs and symptoms of potential                            delayed complications were discussed with the                            patient. Return to normal activities tomorrow.                            Written discharge instructions were provided to the                            patient.                           - Resume previous diet.                           -  Continue present medications.                           - Await pathology results. Milus Banister, MD 02/13/2020 11:39:32 AM This report has been signed electronically.

## 2020-02-13 NOTE — Telephone Encounter (Signed)
  EGD February 2011 Dr. Leonie Douglas at [facility] gastroenterology indication "follow-up of malignant esophageal squamous cell carcinoma, diagnosed by biopsy in mid esophagus and treated with radiation chemotherapy" findings hiatal hernia, no sign of residual cancer.  EGD September 2010 Dr. Leonie Douglas, indication dysphagia.  Findings "a large fungating ulcerated mass in the middle esophagus.  The examination was otherwise normal.  There were no attached colonoscopy reports in this large packet of records from [facility] gastroenterology.

## 2020-02-13 NOTE — Progress Notes (Signed)
A/ox3, pleased with MAC, report to RN 

## 2020-02-13 NOTE — Patient Instructions (Signed)
YOU HAD AN ENDOSCOPIC PROCEDURE TODAY AT THE [facility]:   Refer to the procedure report that was given to you for any specific questions about what was found during the examination.  If the procedure report does not answer your questions, please call your gastroenterologist to clarify.  If you requested that your care partner not be given the details of your procedure findings, then the procedure report has been included in a sealed envelope for you to review at your convenience later.  YOU SHOULD EXPECT: Some feelings of bloating in the abdomen. Passage of more gas than usual.  Walking can help get rid of the air that was put into your GI tract during the procedure and reduce the bloating. If you had a lower endoscopy (such as a colonoscopy or flexible sigmoidoscopy) you may notice spotting of blood in your stool or on the toilet paper. If you underwent a bowel prep for your procedure, you may not have a normal bowel movement for a few days.  Please Note:  You might notice some irritation and congestion in your nose or some drainage.  This is from the oxygen used during your procedure.  There is no need for concern and it should clear up in a day or so.  SYMPTOMS TO REPORT IMMEDIATELY:   Following lower endoscopy (colonoscopy or flexible sigmoidoscopy):  Excessive amounts of blood in the stool  Significant tenderness or worsening of abdominal pains  Swelling of the abdomen that is new, acute  Fever of 100F or higher  For urgent or emergent issues, a gastroenterologist can be reached at any hour by calling [phone]. Do not use MyChart messaging for urgent concerns.    DIET:  We do recommend a small meal at first, but then you may proceed to your regular diet.  Drink plenty of fluids but you should avoid alcoholic beverages for 24 hours.  ACTIVITY:  You should plan to take it easy for the rest of today and you should NOT DRIVE or use heavy machinery until tomorrow (because  of the sedation medicines used during the test).    FOLLOW UP: Our staff will call the number listed on your records 48-72 hours following your procedure to check on you and address any questions or concerns that you may have regarding the information given to you following your procedure. If we do not reach you, we will leave a message.  We will attempt to reach you two times.  During this call, we will ask if you have developed any symptoms of COVID 19. If you develop any symptoms (ie: fever, flu-like symptoms, shortness of breath, cough etc.) before then, please call [phone].  If you test positive for Covid 19 in the 2 weeks post procedure, please call and report this information to us.    If any biopsies were taken you will be contacted by phone or by letter within the next 1-3 weeks.  Please call us at [phone] if you have not heard about the biopsies in 3 weeks.    SIGNATURES/CONFIDENTIALITY: You and/or your care partner have signed paperwork which will be entered into your electronic medical record.  These signatures attest to the fact that that the information above on your After Visit Summary has been reviewed and is understood.  Full responsibility of the confidentiality of this discharge information lies with you and/or your care-partner. 

## 2020-02-13 NOTE — Progress Notes (Signed)
Pt's states no medical or surgical changes since previsit or office visit. 

## 2020-02-13 NOTE — Progress Notes (Signed)
Called to room to assist during endoscopic procedure.  Patient ID and intended procedure confirmed with present staff. Received instructions for my participation in the procedure from the performing physician.  

## 2020-02-15 ENCOUNTER — Telehealth: Payer: Self-pay | Admitting: *Deleted

## 2020-02-15 NOTE — Telephone Encounter (Signed)
  Follow up Call-  Call back number 02/13/2020  Post procedure Call Back phone  # Call Collin Young his daughter [phone]  Permission to leave phone message Yes  Some recent data might be hidden     Patient questions:  Do you have a fever, pain , or abdominal swelling? No. Pain Score  0 *  Have you tolerated food without any problems? Yes.    Have you been able to return to your normal activities? Yes.    Do you have any questions about your discharge instructions: Diet   No. Medications  No. Follow up visit  No.  Do you have questions or concerns about your Care? No.  Actions: * If pain score is 4 or above: No action needed, pain <4.  1. Have you developed a fever since your procedure? no  2.   Have you had an respiratory symptoms (SOB or cough) since your procedure? no  3.   Have you tested positive for COVID 19 since your procedure no  4.   Have you had any family members/close contacts diagnosed with the COVID 19 since your procedure?  no   If yes to any of these questions please route to Joylene John, RN and Erenest Rasher, RN

## 2020-02-27 ENCOUNTER — Ambulatory Visit (HOSPITAL_COMMUNITY)
Admission: RE | Admit: 2020-02-27 | Payer: Medicare Other | Source: Ambulatory Visit | Attending: Cardiology | Admitting: Cardiology

## 2020-02-27 ENCOUNTER — Other Ambulatory Visit: Payer: Self-pay | Admitting: *Deleted

## 2020-02-27 DIAGNOSIS — C61 Malignant neoplasm of prostate: Secondary | ICD-10-CM

## 2020-02-27 DIAGNOSIS — C12 Malignant neoplasm of pyriform sinus: Secondary | ICD-10-CM

## 2020-02-27 MED ORDER — FENTANYL 12 MCG/HR TD PT72: 1.0000 | MEDICATED_PATCH | TRANSDERMAL | 0 refills | Status: DC

## 2020-03-19 ENCOUNTER — Ambulatory Visit (HOSPITAL_COMMUNITY)
Admission: RE | Admit: 2020-03-19 | Discharge: 2020-03-19 | Disposition: A | Discharge status: Home / Self Care | Payer: Medicare Other | Source: Ambulatory Visit | Attending: Cardiovascular Disease | Admitting: Cardiovascular Disease

## 2020-03-19 ENCOUNTER — Other Ambulatory Visit (HOSPITAL_COMMUNITY): Payer: Self-pay | Admitting: Cardiology

## 2020-03-19 ENCOUNTER — Other Ambulatory Visit: Payer: Self-pay

## 2020-03-19 DIAGNOSIS — I739 Peripheral vascular disease, unspecified: Secondary | ICD-10-CM

## 2020-03-27 ENCOUNTER — Other Ambulatory Visit: Payer: Self-pay | Admitting: *Deleted

## 2020-03-27 DIAGNOSIS — C61 Malignant neoplasm of prostate: Secondary | ICD-10-CM

## 2020-03-27 DIAGNOSIS — C12 Malignant neoplasm of pyriform sinus: Secondary | ICD-10-CM

## 2020-03-27 MED ORDER — FENTANYL 12 MCG/HR TD PT72: 1.0000 | MEDICATED_PATCH | TRANSDERMAL | 0 refills | Status: DC

## 2020-04-04 ENCOUNTER — Ambulatory Visit: Payer: Medicare Other | Admitting: Family Medicine

## 2020-04-08 ENCOUNTER — Ambulatory Visit: Payer: Self-pay

## 2020-04-08 NOTE — Telephone Encounter (Addendum)
Three attempts to reach patient. Please see encounters regarding abdominal pain. Collin Young

## 2020-04-17 ENCOUNTER — Other Ambulatory Visit: Payer: Self-pay

## 2020-04-17 ENCOUNTER — Encounter: Payer: Self-pay | Admitting: Hematology & Oncology

## 2020-04-17 ENCOUNTER — Inpatient Hospital Stay: Payer: Medicare Other | Attending: Hematology & Oncology

## 2020-04-17 ENCOUNTER — Ambulatory Visit (HOSPITAL_BASED_OUTPATIENT_CLINIC_OR_DEPARTMENT_OTHER)
Admission: RE | Admit: 2020-04-17 | Discharge: 2020-04-17 | Disposition: A | Discharge status: Home / Self Care | Payer: Medicare Other | Source: Ambulatory Visit | Attending: Hematology & Oncology | Admitting: Hematology & Oncology

## 2020-04-17 ENCOUNTER — Inpatient Hospital Stay (HOSPITAL_BASED_OUTPATIENT_CLINIC_OR_DEPARTMENT_OTHER): Payer: Medicare Other | Admitting: Hematology & Oncology

## 2020-04-17 VITALS — BP 152/74 | HR 84 | Temp 97.8°F | Resp 18 | Wt 128.5 lb

## 2020-04-17 DIAGNOSIS — R0989 Other specified symptoms and signs involving the circulatory and respiratory systems: Secondary | ICD-10-CM | POA: Diagnosis not present

## 2020-04-17 DIAGNOSIS — C12 Malignant neoplasm of pyriform sinus: Secondary | ICD-10-CM | POA: Diagnosis not present

## 2020-04-17 DIAGNOSIS — Z9221 Personal history of antineoplastic chemotherapy: Secondary | ICD-10-CM | POA: Diagnosis not present

## 2020-04-17 DIAGNOSIS — Z923 Personal history of irradiation: Secondary | ICD-10-CM | POA: Diagnosis not present

## 2020-04-17 DIAGNOSIS — R0602 Shortness of breath: Secondary | ICD-10-CM | POA: Diagnosis not present

## 2020-04-17 DIAGNOSIS — E038 Other specified hypothyroidism: Secondary | ICD-10-CM

## 2020-04-17 DIAGNOSIS — R05 Cough: Secondary | ICD-10-CM | POA: Diagnosis not present

## 2020-04-17 DIAGNOSIS — J90 Pleural effusion, not elsewhere classified: Secondary | ICD-10-CM | POA: Diagnosis not present

## 2020-04-17 DIAGNOSIS — Z8522 Personal history of malignant neoplasm of nasal cavities, middle ear, and accessory sinuses: Secondary | ICD-10-CM | POA: Insufficient documentation

## 2020-04-17 LAB — CBC WITH DIFFERENTIAL (CANCER CENTER ONLY)
Abs Immature Granulocytes: 0.01 10*3/uL (ref 0.00–0.07)
Basophils Absolute: 0 10*3/uL (ref 0.0–0.1)
Basophils Relative: 0 %
Eosinophils Absolute: 0 10*3/uL (ref 0.0–0.5)
Eosinophils Relative: 1 %
HCT: 43.9 % (ref 39.0–52.0)
Hemoglobin: 13.9 g/dL (ref 13.0–17.0)
Immature Granulocytes: 0 %
Lymphocytes Relative: 28 %
Lymphs Abs: 0.9 10*3/uL (ref 0.7–4.0)
MCH: 27.9 pg (ref 26.0–34.0)
MCHC: 31.7 g/dL (ref 30.0–36.0)
MCV: 88.2 fL (ref 80.0–100.0)
Monocytes Absolute: 0.6 10*3/uL (ref 0.1–1.0)
Monocytes Relative: 18 %
Neutro Abs: 1.6 10*3/uL — ABNORMAL LOW (ref 1.7–7.7)
Neutrophils Relative %: 53 %
Platelet Count: 171 10*3/uL (ref 150–400)
RBC: 4.98 MIL/uL (ref 4.22–5.81)
RDW: 14.1 % (ref 11.5–15.5)
WBC Count: 3.1 10*3/uL — ABNORMAL LOW (ref 4.0–10.5)
nRBC: 0 % (ref 0.0–0.2)

## 2020-04-17 LAB — CMP (CANCER CENTER ONLY)
ALT: 23 U/L (ref 0–44)
AST: 28 U/L (ref 15–41)
Albumin: 4.1 g/dL (ref 3.5–5.0)
Alkaline Phosphatase: 42 U/L (ref 38–126)
Anion gap: 6 (ref 5–15)
BUN: 17 mg/dL (ref 8–23)
CO2: 30 mmol/L (ref 22–32)
Calcium: 10.1 mg/dL (ref 8.9–10.3)
Chloride: 101 mmol/L (ref 98–111)
Creatinine: 1.43 mg/dL — ABNORMAL HIGH (ref 0.61–1.24)
GFR, Est AFR Am: 58 mL/min — ABNORMAL LOW (ref >60)
GFR, Estimated: 50 mL/min — ABNORMAL LOW (ref >60)
Glucose, Bld: 97 mg/dL (ref 70–99)
Potassium: 5.4 mmol/L — ABNORMAL HIGH (ref 3.5–5.1)
Sodium: 137 mmol/L (ref 135–145)
Total Bilirubin: 0.5 mg/dL (ref 0.3–1.2)
Total Protein: 7.4 g/dL (ref 6.5–8.1)

## 2020-04-17 NOTE — Progress Notes (Signed)
Hematology and Oncology Follow Up Visit  Collin Young [id number] [birth year] 69 y.o. 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  Collin Young is back for followup.  We see him every 6 months.    It sounds like there have been some issues that he has had.  It sounds like he may have some type of peripheral vascular disease.  From what he tells me, it sounds like he had Dopplers of his legs.  These apparently were done a month ago.  He does look like he does have some arterial insufficiency.  I think he sees the vascular doctor or his family doctor within a week.  He sounds a little bit congested when I talk to him.  When I listen to his lungs, he does have some crackling over on the right side.  We will get a chest x-ray on him.  He is not having any problems with pain.  The Duragesic patches seem to work quite well with him.  There has been no issues with nausea or vomiting.  He is not eating as much.  He thinks some of this is from the medications that he is taking.  He has had no bleeding.  He has had no diarrhea.  There has been no problems with the coronavirus.  Overall, I would have to say his performance status is probably ECOG 1-2.    Medications:  Current Outpatient Medications:  .  aspirin EC 81 MG tablet, Take 81 mg by mouth daily., Disp: , Rfl:  .  atorvastatin (LIPITOR) 80 MG tablet, Take 1 tablet (80 mg total) by mouth daily. To lower cholesterol, Disp: 90 tablet, Rfl: 2 .  fentaNYL (DURAGESIC) 12 MCG/HR, Place 1 patch onto the skin every 3 (three) days., Disp: 10 patch, Rfl: 0 .  HYDROcodone-acetaminophen (NORCO) 10-325 MG tablet, Take 1-2 tablets by mouth every 4 (four) hours as needed for moderate pain. Maximum  dose per 24 hours - 8 pills, Disp: 8 tablet, Rfl: 0 .  methylcellulose (CITRUCEL) oral powder, Take 1 packet by mouth daily., Disp: , Rfl:  .  metoprolol succinate (TOPROL-XL) 25 MG 24 hr tablet, Take 0.5 tablets (12.5 mg total) by mouth daily., Disp: 45 tablet, Rfl: 3 .  sacubitril-valsartan (ENTRESTO) 24-26 MG, Take 1 tablet by mouth 2 (two) times daily. (Patient taking differently: Take 1 tablet by mouth daily. ), Disp: 180 tablet, Rfl: 3 No current facility-administered medications for this visit.  Facility-Administered Medications Ordered in Other Visits:  .  topical emolient (BIAFINE) emulsion, , Topical, Daily, Kyung Rudd, MD, Given at 01/19/14 3474  Allergies: No Known Allergies  Past Medical History, Surgical history, Social history, and Family History were reviewed and updated.  Review of Systems: Review of Systems  Constitutional: Negative.   HENT: Negative.   Eyes: Negative.   Respiratory: Negative.   Cardiovascular: Negative.   Gastrointestinal: Negative.   Genitourinary: Negative.   Musculoskeletal: Negative.   Skin: Negative.   Neurological: Negative.   Endo/Heme/Allergies: Negative.   Psychiatric/Behavioral: Negative.      Physical Exam:  weight is 128 lb 8 oz (58.3 kg). His oral temperature is 97.8 F (36.6 C). His blood pressure is 152/74 (abnormal) and his pulse is 84. His respiration is 18  and oxygen saturation is 98%.   Physical Exam Vitals reviewed.  HENT:     Head: Normocephalic and atraumatic.  Eyes:     Pupils: Pupils are equal, round, and reactive to light.  Cardiovascular:     Rate and Rhythm: Normal rate and regular rhythm.     Heart sounds: Normal heart sounds.  Pulmonary:     Effort: Pulmonary effort is normal.     Breath sounds: Normal breath sounds.  Abdominal:     General: Bowel sounds are normal.     Palpations: Abdomen is soft.     Comments: Abdominal exam shows a soft abdomen.  Bowel sounds are present.  He has the healed G-tube  opening.  There is no exudate.  He has no fluid wave.  There is no palpable liver or spleen tip.  Musculoskeletal:        General: No tenderness or deformity. Normal range of motion.     Cervical back: Normal range of motion.  Lymphadenopathy:     Cervical: No cervical adenopathy.  Skin:    General: Skin is warm and dry.     Findings: No erythema or rash.  Neurological:     Mental Status: He is alert and oriented to person, place, and time.  Psychiatric:        Behavior: Behavior normal.        Thought Content: Thought content normal.        Judgment: Judgment normal.     Lab Results  Component Value Date   WBC 3.1 (L) 04/17/2020   HGB 13.9 04/17/2020   HCT 43.9 04/17/2020   MCV 88.2 04/17/2020   PLT 171 04/17/2020     Chemistry      Component Value Date/Time   NA 137 04/17/2020 1415   NA 139 01/03/2020 1502   NA 139 07/30/2017 0926   NA 137 03/10/2017 1000   K 5.4 (H) 04/17/2020 1415   K 3.9 07/30/2017 0926   K 4.3 03/10/2017 1000   CL 101 04/17/2020 1415   CL 96 (L) 07/30/2017 0926   CO2 30 04/17/2020 1415   CO2 33 07/30/2017 0926   CO2 29 03/10/2017 1000   BUN 17 04/17/2020 1415   BUN 19 01/03/2020 1502   BUN 17 07/30/2017 0926   BUN 14.7 03/10/2017 1000   CREATININE 1.43 (H) 04/17/2020 1415   CREATININE 1.4 (H) 07/30/2017 0926   CREATININE 1.1 03/10/2017 1000      Component Value Date/Time   CALCIUM 10.1 04/17/2020 1415   CALCIUM 9.6 07/30/2017 0926   CALCIUM 10.0 03/10/2017 1000   ALKPHOS 42 04/17/2020 1415   ALKPHOS 49 07/30/2017 0926   ALKPHOS 51 03/10/2017 1000   AST 28 04/17/2020 1415   AST 23 03/10/2017 1000   ALT 23 04/17/2020 1415   ALT 31 07/30/2017 0926   ALT 18 03/10/2017 1000   BILITOT 0.5 04/17/2020 1415   BILITOT 0.41 03/10/2017 1000       Impression and Plan: [name] is 69 year old gentleman. He had a localized recurrence of carcinoma of the left pyriform sinus. He underwent concurrent chemotherapy and radiation therapy. He  finished his treatment back in April of 2015.   Hopefully, he will not need surgery for peripheral vascular disease.  It really does not surprise me that he has this however.  I think he might be looking for a new family doctor.  I think he would like to try to have a lot of his medical care  in our building.  I will see about making a referral to one of our excellent internal medicine doctors.  We will see what the chest x-ray shows.  If there is any problem on the chest x-ray, we will have to get a CT scan.  I would like to see him back in 3 months, not 6 months.  He just bothers me that there might be something going on with him and I does want to make sure we follow-up closely.     [name], MD 8/18/20213:01 PM

## 2020-04-19 ENCOUNTER — Encounter: Payer: Self-pay | Admitting: Gastroenterology

## 2020-04-19 ENCOUNTER — Ambulatory Visit (INDEPENDENT_AMBULATORY_CARE_PROVIDER_SITE_OTHER): Payer: Medicare Other | Admitting: Gastroenterology

## 2020-04-19 VITALS — BP 110/60 | HR 84 | Ht 66.0 in | Wt 128.1 lb

## 2020-04-19 DIAGNOSIS — R197 Diarrhea, unspecified: Secondary | ICD-10-CM | POA: Diagnosis not present

## 2020-04-19 NOTE — Patient Instructions (Signed)
If you are age 69 or older, your body mass index should be between 23-30. Your Body mass index is 20.68 kg/m. If this is out of the aforementioned range listed, please consider follow up with your Primary Care Provider.  If you are age 57 or younger, your body mass index should be between 19-25. Your Body mass index is 20.68 kg/m. If this is out of the aformentioned range listed, please consider follow up with your Primary Care Provider.   If you were to have loose stools on a daily basis, please take Immodium over-the-counter everyday.  You will follow up with our office on an as needed basis.   Thank you for entrusting me with your care and choosing [facility].  Dr Ardis Hughs

## 2020-04-19 NOTE — Progress Notes (Signed)
Review of pertinent gastrointestinal problems: 1.  Esophageal squamous cell cancer.  Diagnosed 2010 Dr. Leonie Douglas.  Treated with radiation and chemotherapy.  Never surgery. 2.  Piriform sinus cancer.  Treated with chemo and radiation, never surgery.  At one point needed temporary G-tube, long since removed. 3.  Colonoscopy June 2021 for fecal incontinence, loose stools.  Diverticulosis throughout the entire colon.  A 3 mm polyp was removed.  Biopsies were taken randomly from the colon.  The polyp was a tubular adenoma, recall at 7 years.  Biopsies randomly from the colon suggested "focal active colitis"   HPI: This is a very pleasant 69 year old man whom I last saw the time of colonoscopy about 2 months ago.  He is a bit of a scattered historian.  He actually was not sure why he was even here today.  It seems like he did take Imodium once daily for a week or 2.  He stopped.  He tells me now his bowels are working just fine for him.  He is not very bothered at all by his bowels.  No significant diarrhea anymore.  No bleeding, no abdominal pain  He asked about a penile implant pump and I explained to him that urology would be able to help him with that.  He told me he did not need referral   ROS: complete GI ROS as described in HPI, all other review negative.  Constitutional:  No unintentional weight loss   Past Medical History:  Diagnosis Date  . Chronic combined systolic and diastolic CHF (congestive heart failure) ([place])    followed by [name]  . COPD (chronic obstructive pulmonary disease) ([place])   . Coronary artery disease cardiologist-- [name]   per cardiac cath 01-05-2017  chronic total occlusion pLCx with collaterals, 99% severe calcified prox. to mid RCA, otherwise mild to moderate CAD (medically managed)  . Esophageal cancer, stage IIIB [facility]) oncologist-  dr ennever/  dr moody   dx 2010  SCC Stage IIIB completed chemoradiation;  localized recurrent left  piriform sinus 01/ 2015,  completed concurrent chemoradiatoin 04/ 2015  . GERD (gastroesophageal reflux disease)    09-07-2018   no issues since Gtube removed 06/ 2019  . Headache   . History of alcohol abuse    quit 2001  . History of cancer chemotherapy    2010;   2015  . History of radiation therapy    10-19-2013 to  12-05-2013 pyriform sinus 69.96 Gy/104fx;   Radiation completed 2010 for esophageal cancer  . History of seizure 2001   alcohol withdrawal  . HOH (hard of hearing)   . Hyperlipidemia   . Hypertension   . Ischemic cardiomyopathy 12/2016   01-03-2017  echo,  ef 10-15%/   echo 05-06-2017 EF improved to 45-50%  . Lower urinary tract symptoms (LUTS)   . Prostate cancer [facility]) urologist-  dr ottelin/  oncologist-- [name]   dx 05-27-2018--- Stage T1c,  Gleason 4+3,  PSA 9.3--  scheduled for brachytherapy 09-23-2017  . Renal cyst, left   . Voice hoarseness    secondary to radiation treatment    Past Surgical History:  Procedure Laterality Date  . CARDIOVASCULAR STRESS TEST  10/09/09   normal nuclearr stress test, EF 57% [name])  . IR GASTROSTOMY TUBE MOD SED  01/13/2017  . IR GASTROSTOMY TUBE REMOVAL  02/03/2018  . IR PATIENT EVAL TECH 0-60 MINS  03/25/2017  . IR REMOVAL TUN ACCESS W/ PORT W/O FL MOD  SED  01/15/2017  . IR REPLACE G-TUBE SIMPLE WO FLUORO  01/13/2018  . LARYNGOSCOPY N/A 09/15/2013   Procedure: LARYNGOSCOPY;  Surgeon: [name], MD;  Location: [place];  Service: ENT;  Laterality: N/A;  direct laryngoscopy with biopsy and esophagoscopy  . RADIOACTIVE SEED IMPLANT N/A 09/23/2018   Procedure: RADIOACTIVE SEED IMPLANT/BRACHYTHERAPY IMPLANT;  Surgeon: [name], MD;  Location: [facility];  Service: Urology;  Laterality: N/A;  . RIGHT/LEFT HEART CATH AND CORONARY ANGIOGRAPHY N/A 01/05/2017   Procedure: Right/Left Heart Cath and Coronary Angiography;  Surgeon: [name], MD;  Location: [place] [facility];  Service: Cardiovascular;   Laterality: N/A;  . TRANSTHORACIC ECHOCARDIOGRAM  05/06/2017   ef 45-50%,  grade 1 diastolic dysfunction/  AV severe calcified non coronary cusp with moderate regurg. , no stenosis (valve area per echo 01-05-2017 1.08cm^2)/  mild MR, TR, and PR/  mild LAE    Current Outpatient Medications  Medication Sig Dispense Refill  . aspirin EC 81 MG tablet Take 81 mg by mouth daily.    Marland Kitchen atorvastatin (LIPITOR) 80 MG tablet Take 1 tablet (80 mg total) by mouth daily. To lower cholesterol 90 tablet 2  . fentaNYL (DURAGESIC) 12 MCG/HR Place 1 patch onto the skin every 3 (three) days. 10 patch 0  . HYDROcodone-acetaminophen (NORCO) 10-325 MG tablet Take 1-2 tablets by mouth every 4 (four) hours as needed for moderate pain. Maximum dose per 24 hours - 8 pills 8 tablet 0  . methylcellulose (CITRUCEL) oral powder Take 1 packet by mouth daily.    . metoprolol succinate (TOPROL-XL) 25 MG 24 hr tablet Take 0.5 tablets (12.5 mg total) by mouth daily. 45 tablet 3  . sacubitril-valsartan (ENTRESTO) 24-26 MG Take 1 tablet by mouth 2 (two) times daily. (Patient taking differently: Take 1 tablet by mouth daily. ) 180 tablet 3   No current facility-administered medications for this visit.   Facility-Administered Medications Ordered in Other Visits  Medication Dose Route Frequency Provider Last Rate Last Admin  . topical emolient (BIAFINE) emulsion   Topical Daily Kyung Rudd, MD   Given at 01/19/14 0912    Allergies as of 04/19/2020  . (No Known Allergies)    Family History  Problem Relation Age of Onset  . Breast cancer Mother   . Prostate cancer Neg Hx   . Colon cancer Neg Hx   . Pancreatic cancer Neg Hx     Social History   Socioeconomic History  . Marital status: Legally Separated    Spouse name: Not on file  . Number of children: 2  . Years of education: Not on file  . Highest education level: Not on file  Occupational History  . Occupation: retired  Tobacco Use  . Smoking status: Former  Smoker    Packs/day: 1.00    Years: 40.00    Pack years: 40.00    Types: Cigarettes    Start date: 11/08/1960    Quit date: 08/31/2009    Years since quitting: 10.6  . Smokeless tobacco: Former Systems developer    Types: Chew    Quit date: 09/01/1999  Vaping Use  . Vaping Use: Never used  Substance and Sexual Activity  . Alcohol use: Not Currently    Alcohol/week: 0.0 standard drinks    Comment: quit in 2001  . Drug use: No    Comment: back in the day used cocaine,alcohol, marijuana  . Sexual activity: Yes    Partners: Female    Birth control/protection: None  Other Topics Concern  . Not on file  Social History Narrative  . Not on file   Social Determinants of Health   Financial Resource Strain:   . Difficulty of Paying Living Expenses: Not on file  Food Insecurity:   . Worried About Charity fundraiser in the Last Year: Not on file  . Ran Out of Food in the Last Year: Not on file  Transportation Needs:   . Lack of Transportation (Medical): Not on file  . Lack of Transportation (Non-Medical): Not on file  Physical Activity:   . Days of Exercise per Week: Not on file  . Minutes of Exercise per Session: Not on file  Stress:   . Feeling of Stress : Not on file  Social Connections:   . Frequency of Communication with Friends and Family: Not on file  . Frequency of Social Gatherings with Friends and Family: Not on file  . Attends Religious Services: Not on file  . Active Member of Clubs or Organizations: Not on file  . Attends Archivist Meetings: Not on file  . Marital Status: Not on file  Intimate Partner Violence:   . Fear of Current or Ex-Partner: Not on file  . Emotionally Abused: Not on file  . Physically Abused: Not on file  . Sexually Abused: Not on file     Physical Exam: BP 110/60 (BP Location: Left Arm, Patient Position: Sitting, Cuff Size: Normal)   Pulse 84   Ht 5\' 6"  (1.676 m)   Wt 128 lb 2 oz (58.1 kg)   SpO2 98%   BMI 20.68 kg/m  Constitutional:  generally well-appearing Psychiatric: alert and oriented x3 Abdomen: soft, nontender, nondistended, no obvious ascites, no peritoneal signs, normal bowel sounds No peripheral edema noted in lower extremities  Assessment and plan: 69 y.o. male with improved diarrhea  Colonoscopy June 2021.  His bowels are not bothering him anymore after a brief empiric treatment with Imodium.  He knows to call here if he has any further questions or concerns.  Please see the "Patient Instructions" section for addition details about the plan.  Owens Loffler, MD [facility] 04/19/2020, 1:29 PM   Total time on date of encounter was 21 minutes (this included time spent preparing to see the patient reviewing records; obtaining and/or reviewing separately obtained history; performing a medically appropriate exam and/or evaluation; counseling and educating the patient and family if present; ordering medications, tests or procedures if applicable; and documenting clinical information in the health record).

## 2020-04-22 ENCOUNTER — Telehealth: Payer: Self-pay | Admitting: *Deleted

## 2020-04-22 NOTE — Telephone Encounter (Signed)
Message received from patient's daughter, Linus Orn requesting pt.'s CXR results.  Dr. Marin Olp notified and CXR reviewed from 04/17/20.  Call placed back to Linus Orn and [place] notified per order of Dr. Marin Olp that pt.'s CXR shows "no cancer and underlying COPD."  Linus Orn is appreciative of call back and requests that CXR results be faxed to Dr. Chapman Fitch and Dr. Oval Linsey.  CXR results faxed per [name]'s request.

## 2020-04-23 ENCOUNTER — Other Ambulatory Visit: Payer: Self-pay | Admitting: *Deleted

## 2020-04-23 NOTE — Progress Notes (Unsigned)
bulatory 

## 2020-05-01 ENCOUNTER — Telehealth: Payer: Self-pay | Admitting: Family Medicine

## 2020-05-01 NOTE — Telephone Encounter (Signed)
Patient was called, identified, and informed that his medical records that he requested for earlier this morning was ready for pick up. Patient was informed the records were placed at the front desk for pick up.  Patient verbalized understanding and had no further questions or concerns at this time.

## 2020-05-02 ENCOUNTER — Other Ambulatory Visit: Payer: Self-pay | Admitting: *Deleted

## 2020-05-02 DIAGNOSIS — C61 Malignant neoplasm of prostate: Secondary | ICD-10-CM

## 2020-05-02 DIAGNOSIS — C12 Malignant neoplasm of pyriform sinus: Secondary | ICD-10-CM

## 2020-05-02 MED ORDER — FENTANYL 12 MCG/HR TD PT72: 1.0000 | MEDICATED_PATCH | TRANSDERMAL | 0 refills | Status: DC

## 2020-05-07 ENCOUNTER — Ambulatory Visit: Payer: Medicare Other | Admitting: Critical Care Medicine

## 2020-05-08 NOTE — Progress Notes (Signed)
[facility] [place], [place], [place] [address] [phone]  Date:  05/09/2020   Name:  Collin Young   DOB:  December 18, 1950   MRN:  [id number]  PCP:  Antony Blackbird, MD    Chief Complaint: New Patient (Initial Visit)   History of Present Illness:  Collin Young is a 69 y.o. very pleasant male patient who presents with the following:  New patient here today to establish care-seen on request of his oncologist, Dr. Marin Olp He has history of esophageal cancer, piriform sinus cancer status post chemo and radiation 2015 as well as feeding tube since reversed, prostate cancer, CHF/CAD He did not have to undergo surgery for his head neck cancer per his report  Most recent visit with oncology last month: Principle Diagnosis:  Stage II (T2 N0M0) squamous cell carcinoma of the left piriform sinus Stage IIIB squamous cell carcinoma of the esophagus-remission Congestive heart failure Stage T1c prostate cancer Current Therapy:         Status post prostate brachytherapy   He is using Duragesic for chronic pain-pain is mostly in his head and neck  He is seen by vascular surgery, Dr. Donzetta Matters for history of PAD.  For the time being this is managed conservatively Gastroenterologist is Dr. Oretha Caprice His primary cardiologist is Dr. Skeet Latch, was seen by Kerin Ransom, PA-C in May: CAD (coronary artery disease) High grade pRCA disease and CTO of CFX at cath May 2018- no PCI secondary to CRI and acute illness.  His EF did recover without intervention.  Congestive dilated cardiomyopathy ([place]) EF 10-15% by echo May 2018- improved to 45-50% by echo Sept 2018 Claudication in peripheral vascular disease ([place]) Extensive PVD on exam- check LEA dopplers Late latent syphilis Dr Linus Salmons has treated him Malignant neoplasm of pyriform sinus [facility]) Required tube feeding till June 2019 Malignant neoplasm of prostate ([place]) S/P seed implant  Jan 2020  Hep C screening-can be done at next routine labs Flu vaccine-we do not have the senior dose, encourage patient to have this done in the community Covid vaccine- this is done  Pneumonia vaccine- per pt this was done by his previous PCP, we will look for this record Colon cancer screen up-to-date ?  Urology care- per [facility]- per pt he is still under their care   He is from this area, divorced.  He hs 2 grown daughters and several grands and great- grands  Retired from his work in the concrete business   Pt notes that sometimes his chest may hurt if he coughs, esp if the cough wakes him up at night  He does not have any chest pain or abnormal shortness of breath with activity. However, he did smoke for a long time and notes that his breathing is never great-this is not an acute change He did have a chest film per Dr. Marin Olp last month-suggestive of COPD IMPRESSION: Small right pleural effusion. Stable mild hyperexpansion of the lungs. No edema or airspace opacity. Stable apical pleural thickening bilaterally. Cardiac silhouette within normal limits. No adenopathy evident.  He quit smoking about 10 years ago, also quit drinking He would be interested in trying inhaler for COPD symptoms in case it might help Patient Active Problem List   Diagnosis Date Noted  . Claudication in peripheral vascular disease ([place]) 01/25/2020  . Late latent syphilis 01/15/2020  . CAD (coronary artery disease) 07/05/2019  . CRI (chronic renal insufficiency),  stage 3 (moderate) 07/05/2019  . Combined systolic and diastolic heart failure ([place]) 09/26/2018  . Malignant neoplasm of prostate ([place]) 06/17/2018  . Angina decubitus ([place])   . Medication management   . Dysphagia   . Palliative care by specialist   . Ischemic cardiomyopathy   . Congestive dilated cardiomyopathy ([place])   . Malignant neoplasm of pyriform sinus ([place]) 09/28/2013  . 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 NONSPECIFIC ABN  FINDING RAD & OTH EXAM GI TRACT 05/14/2009    Past Medical History:  Diagnosis Date  . Chronic combined systolic and diastolic CHF (congestive heart failure) ([place])    followed by [name]  . COPD (chronic obstructive pulmonary disease) ([place])   . Coronary artery disease cardiologist-- [name]   per cardiac cath 01-05-2017  chronic total occlusion pLCx with collaterals, 99% severe calcified prox. to mid RCA, otherwise mild to moderate CAD (medically managed)  . Esophageal cancer, stage IIIB [facility]) oncologist-  dr ennever/  dr moody   dx 2010  SCC Stage IIIB completed chemoradiation;  localized recurrent left piriform sinus 01/ 2015,  completed concurrent chemoradiatoin 04/ 2015  . GERD (gastroesophageal reflux disease)    09-07-2018   no issues since Gtube removed 06/ 2019  . Headache   . History of alcohol abuse    quit 2001  . History of cancer chemotherapy    2010;   2015  . History of radiation therapy    10-19-2013 to  12-05-2013 pyriform sinus 69.96 Gy/77fx;   Radiation completed 2010 for esophageal cancer  . History of seizure 2001   alcohol withdrawal  . HOH (hard of hearing)   . Hyperlipidemia   . Hypertension   . Ischemic cardiomyopathy 12/2016   01-03-2017  echo,  ef 10-15%/   echo 05-06-2017 EF improved to 45-50%  . Lower urinary tract symptoms (LUTS)   . Prostate cancer [facility]) urologist-  dr ottelin/  oncologist-- [name]   dx 05-27-2018--- Stage T1c,  Gleason 4+3,  PSA 9.3--  scheduled for brachytherapy 09-23-2017  . Renal cyst, left   . Voice hoarseness    secondary to radiation treatment    Past Surgical History:  Procedure Laterality Date  . CARDIOVASCULAR STRESS TEST  10/09/09   normal nuclearr stress test, EF 57% [name])  . IR GASTROSTOMY TUBE MOD SED  01/13/2017  . IR GASTROSTOMY TUBE REMOVAL  02/03/2018  . IR PATIENT EVAL TECH 0-60 MINS  03/25/2017  . IR REMOVAL TUN ACCESS W/ PORT W/O FL MOD SED  01/15/2017  . IR REPLACE G-TUBE SIMPLE WO  FLUORO  01/13/2018  . LARYNGOSCOPY N/A 09/15/2013   Procedure: LARYNGOSCOPY;  Surgeon: [name], MD;  Location: [place];  Service: ENT;  Laterality: N/A;  direct laryngoscopy with biopsy and esophagoscopy  . RADIOACTIVE SEED IMPLANT N/A 09/23/2018   Procedure: RADIOACTIVE SEED IMPLANT/BRACHYTHERAPY IMPLANT;  Surgeon: [name], MD;  Location: [facility];  Service: Urology;  Laterality: N/A;  . RIGHT/LEFT HEART CATH AND CORONARY ANGIOGRAPHY N/A 01/05/2017   Procedure: Right/Left Heart Cath and Coronary Angiography;  Surgeon: [name], MD;  Location: [place] [facility];  Service: Cardiovascular;  Laterality: N/A;  . TRANSTHORACIC ECHOCARDIOGRAM  05/06/2017   ef 45-50%,  grade 1 diastolic dysfunction/  AV severe calcified non coronary cusp with moderate regurg. , no stenosis (valve area per echo 01-05-2017 1.08cm^2)/  mild MR, TR, and PR/  mild LAE    Social History   Tobacco Use  .  Smoking status: Former Smoker    Packs/day: 1.00    Years: 40.00    Pack years: 40.00    Types: Cigarettes    Start date: 11/08/1960    Quit date: 08/31/2009    Years since quitting: 10.6  . Smokeless tobacco: Former Systems developer    Types: Chew    Quit date: 09/01/1999  Vaping Use  . Vaping Use: Never used  Substance Use Topics  . Alcohol use: Not Currently    Alcohol/week: 0.0 standard drinks    Comment: quit in 2001  . Drug use: No    Comment: back in the day used cocaine,alcohol, marijuana    Family History  Problem Relation Age of Onset  . Breast cancer Mother   . Prostate cancer Neg Hx   . Colon cancer Neg Hx   . Pancreatic cancer Neg Hx     No Known Allergies  Medication list has been reviewed and updated.  Current Outpatient Medications on File Prior to Visit  Medication Sig Dispense Refill  . aspirin EC 81 MG tablet Take 81 mg by mouth daily.    Marland Kitchen atorvastatin (LIPITOR) 80 MG tablet Take 1 tablet (80 mg total) by mouth daily. To lower cholesterol 90 tablet 2  .  fentaNYL (DURAGESIC) 12 MCG/HR Place 1 patch onto the skin every 3 (three) days. 10 patch 0  . metoprolol succinate (TOPROL-XL) 25 MG 24 hr tablet Take 0.5 tablets (12.5 mg total) by mouth daily. 45 tablet 3  . sacubitril-valsartan (ENTRESTO) 24-26 MG Take 1 tablet by mouth 2 (two) times daily. (Patient taking differently: Take 1 tablet by mouth daily. ) 180 tablet 3   Current Facility-Administered Medications on File Prior to Visit  Medication Dose Route Frequency Provider Last Rate Last Admin  . topical emolient (BIAFINE) emulsion   Topical Daily Kyung Rudd, MD   Given at 01/19/14 7893    Review of Systems:  As per HPI- otherwise negative.   Physical Examination: Vitals:   05/09/20 1407  BP: 110/68  Pulse: 82  Resp: 17  SpO2: 96%   Vitals:   05/09/20 1407  Weight: 129 lb (58.5 kg)  Height: 5\' 6"  (1.676 m)   Body mass index is 20.82 kg/m. Ideal Body Weight: Weight in (lb) to have BMI = 25: 154.6  GEN: no acute distress. Slim build, generally looks well. Hoarse voice consistent with prior history of throat cancer and radiation HEENT: Atraumatic, Normocephalic.  Ears and Nose: No external deformity. CV: RRR, No M/G/R. No JVD. No thrill. No extra heart sounds. PULM: CTA B, no wheezes, crackles, rhonchi. No retractions. No resp. distress. No accessory muscle use. ABD: S, NT, ND, +BS. No rebound. No HSM. Well-healed scars from feeding tube placement EXTR: No c/c/e PSYCH: Normally interactive. Conversant.    Assessment and Plan: Coronary artery disease involving native coronary artery of native heart without angina pectoris  Claudication in peripheral vascular disease (HCC)  Malignant neoplasm of pyriform sinus (HCC)  Ischemic cardiomyopathy  SOB (shortness of breath)  Acute bronchitis with COPD ([place]) - Plan: tiotropium (SPIRIVA HANDIHALER) 18 MCG inhalation capsule  Patient today to establish primary care He is also seen by oncology, cardiology, urology, GI His  main concern today is that he sinus will be awoken at night with a cough which can be painful. He also notes some chronic mild shortness of breath. I would like to try him on Spiriva for potential COPD symptoms. We will plan to try this for about 1 month, he  will let me know how it works for him Otherwise, plan to see him back in 3 to 4 months for follow-up visit We discussed health maintenance, he is relatively up-to-date I am not able to find evidence of previous pneumonia vaccine, will offer to give this series  This visit occurred during the SARS-CoV-2 public health emergency.  Safety protocols were in place, including screening questions prior to the visit, additional usage of staff PPE, and extensive cleaning of exam room while observing appropriate contact time as indicated for disinfecting solutions.     Signed Lamar Blinks, MD

## 2020-05-09 ENCOUNTER — Other Ambulatory Visit: Payer: Self-pay

## 2020-05-09 ENCOUNTER — Ambulatory Visit (INDEPENDENT_AMBULATORY_CARE_PROVIDER_SITE_OTHER): Payer: Medicare Other | Admitting: Family Medicine

## 2020-05-09 ENCOUNTER — Encounter: Payer: Self-pay | Admitting: Family Medicine

## 2020-05-09 VITALS — BP 110/68 | HR 82 | Resp 17 | Ht 66.0 in | Wt 129.0 lb

## 2020-05-09 DIAGNOSIS — J440 Chronic obstructive pulmonary disease with acute lower respiratory infection: Secondary | ICD-10-CM

## 2020-05-09 DIAGNOSIS — I2510 Atherosclerotic heart disease of native coronary artery without angina pectoris: Secondary | ICD-10-CM

## 2020-05-09 DIAGNOSIS — J209 Acute bronchitis, unspecified: Secondary | ICD-10-CM

## 2020-05-09 DIAGNOSIS — I255 Ischemic cardiomyopathy: Secondary | ICD-10-CM

## 2020-05-09 DIAGNOSIS — C12 Malignant neoplasm of pyriform sinus: Secondary | ICD-10-CM | POA: Diagnosis not present

## 2020-05-09 DIAGNOSIS — I739 Peripheral vascular disease, unspecified: Secondary | ICD-10-CM

## 2020-05-09 DIAGNOSIS — R0602 Shortness of breath: Secondary | ICD-10-CM | POA: Diagnosis not present

## 2020-05-09 MED ORDER — SPIRIVA HANDIHALER 18 MCG IN CAPS: 18.0000 ug | ORAL_CAPSULE | Freq: Every day | RESPIRATORY_TRACT | 12 refills | Status: DC

## 2020-05-09 NOTE — Patient Instructions (Addendum)
It was very nice to meet you today, I will look forward to working with you  I would like to try and inhaler called Spiriva for your breathing.  Please use this once a day, and let me know how it works for you.  If it does not seem to be helping after about 1 month we can stop using it  Please be sure to get your flu shot this fall  Please see me in the office in about 4 months to check in

## 2020-05-16 ENCOUNTER — Encounter: Payer: Self-pay | Admitting: Family Medicine

## 2020-05-16 DIAGNOSIS — J209 Acute bronchitis, unspecified: Secondary | ICD-10-CM

## 2020-05-16 DIAGNOSIS — R0602 Shortness of breath: Secondary | ICD-10-CM

## 2020-05-17 ENCOUNTER — Encounter: Payer: Self-pay | Admitting: Cardiovascular Disease

## 2020-05-17 ENCOUNTER — Ambulatory Visit (INDEPENDENT_AMBULATORY_CARE_PROVIDER_SITE_OTHER): Payer: Medicare Other | Admitting: Cardiovascular Disease

## 2020-05-17 ENCOUNTER — Telehealth: Payer: Self-pay | Admitting: *Deleted

## 2020-05-17 ENCOUNTER — Other Ambulatory Visit: Payer: Self-pay

## 2020-05-17 VITALS — BP 122/77 | HR 92 | Temp 97.2°F | Ht 66.0 in | Wt 131.6 lb

## 2020-05-17 DIAGNOSIS — I5043 Acute on chronic combined systolic (congestive) and diastolic (congestive) heart failure: Secondary | ICD-10-CM | POA: Diagnosis not present

## 2020-05-17 DIAGNOSIS — I255 Ischemic cardiomyopathy: Secondary | ICD-10-CM | POA: Diagnosis not present

## 2020-05-17 DIAGNOSIS — I2510 Atherosclerotic heart disease of native coronary artery without angina pectoris: Secondary | ICD-10-CM

## 2020-05-17 DIAGNOSIS — R0602 Shortness of breath: Secondary | ICD-10-CM | POA: Diagnosis not present

## 2020-05-17 NOTE — Telephone Encounter (Signed)
Called patient's daughter, Linus Orn to discuss  Her dad is using the Spiriva-it is perhaps somewhat helpful, but no major difference I will have him follow-up with cardiology-we will contact his primary cardiologist, Dr. Oval Linsey and asked her to reach out to patient I will also get him referred to pulmonology Would recommend continuing Spiriva in the meantime

## 2020-05-17 NOTE — Telephone Encounter (Signed)
Spoke with pt appointment made today with Dr Oval Linsey at 3:40 am

## 2020-05-17 NOTE — Progress Notes (Signed)
Cardiology Office Note   Date:  05/19/2020   ID:  SHIGERU LAMPERT, DOB 1951/03/26, MRN [id number]  PCP:  Antony Blackbird, MD  Cardiologist:   Skeet Latch, MD   No chief complaint on file.    History of Present Illness: Collin Young is a 69 y.o. 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  Collin Young was admitted 12/2016 with progressive shortness of breath.  BNP was >2000.  Echo revealed LVEF 10-15% with diffuse hypokinesis, moderate MR and moderate AR.  PASP was 49 mmHg.  He underwent left heart catheterization and was found to have 100% proximal LCx with L-->L collaterals, 99% prox-mid RCA and otherwise mild-moderate CAD.  There was consideration of RCA PCI.  However, this would have required hemodynamic support and was deferred given his frailty and comorbidities.  He was treated with milrinone and IV lasix.  Milrinone was weaned prior to discharge.  During that hospitalization he was also treated for MSSA bacteremia.  He was unable to get a TEE due to his prior cancer.  His Port was removed and a PICC line was placed.   He had a  PEG tube placed due to frank aspiration.  At his last appointment Collin Young was referred for an echo 05/2017 that showed a significant improvement in his LVEF to 45-50% and grade 1 diastolic dysfunction.  There was moderate aortic regurgitation.  He underwent radiation of the prostate.  Lately Collin Young has been feeling more short of breath.  His PCP recommended starting Spiriva.  Since he started it he felt more short of breath.  He notices it walking even short distances.  It is worse at night and he has to sit up to catch his breath.  He had CXR 8/18 that revealed a small R pleural effusion but was otherwise unremarkable.  He notes mild phleghm. This is unchanged since his  cancer treatment.  He has no fever or chills.  He occasionally has chest tightness that occurs both at rest and with exertion.  He denies any lower extremity edema.  He has not had any changes in his diet or sodium intake.  He notes that he has been taking his Delene Loll only once daily due to a misunderstanding.   Past Medical History:  Diagnosis Date  . Chronic combined systolic and diastolic CHF (congestive heart failure) ([place])    followed by [name]  . COPD (chronic obstructive pulmonary disease) ([place])   . Coronary artery disease cardiologist-- [name]   per cardiac cath 01-05-2017  chronic total occlusion pLCx with collaterals, 99% severe calcified prox. to mid RCA, otherwise mild to moderate CAD (medically managed)  . Esophageal cancer, stage IIIB [facility]) oncologist-  dr ennever/  dr moody   dx 2010  SCC Stage IIIB completed chemoradiation;  localized recurrent left piriform sinus 01/ 2015,  completed concurrent chemoradiatoin 04/ 2015  . GERD (gastroesophageal reflux disease)    09-07-2018   no issues since Gtube removed 06/ 2019  . Headache   . History of alcohol abuse    quit 2001  . History of cancer chemotherapy    2010;   2015  . History of radiation therapy    10-19-2013 to  12-05-2013 pyriform sinus 69.96 Gy/79fx;   Radiation completed 2010 for esophageal cancer  .  History of seizure 2001   alcohol withdrawal  . HOH (hard of hearing)   . Hyperlipidemia   . Hypertension   . Ischemic cardiomyopathy 12/2016   01-03-2017  echo,  ef 10-15%/   echo 05-06-2017 EF improved to 45-50%  . Lower urinary tract symptoms (LUTS)   . Prostate cancer [facility]) urologist-  dr ottelin/  oncologist-- [name]   dx 05-27-2018--- Stage T1c,  Gleason 4+3,  PSA 9.3--  scheduled for brachytherapy 09-23-2017  . Renal cyst, left   . Voice hoarseness    secondary to radiation treatment    Past Surgical History:  Procedure Laterality Date  . CARDIOVASCULAR STRESS TEST  10/09/09    normal nuclearr stress test, EF 57% [name])  . IR GASTROSTOMY TUBE MOD SED  01/13/2017  . IR GASTROSTOMY TUBE REMOVAL  02/03/2018  . IR PATIENT EVAL TECH 0-60 MINS  03/25/2017  . IR REMOVAL TUN ACCESS W/ PORT W/O FL MOD SED  01/15/2017  . IR REPLACE G-TUBE SIMPLE WO FLUORO  01/13/2018  . LARYNGOSCOPY N/A 09/15/2013   Procedure: LARYNGOSCOPY;  Surgeon: [name], MD;  Location: [place];  Service: ENT;  Laterality: N/A;  direct laryngoscopy with biopsy and esophagoscopy  . RADIOACTIVE SEED IMPLANT N/A 09/23/2018   Procedure: RADIOACTIVE SEED IMPLANT/BRACHYTHERAPY IMPLANT;  Surgeon: [name], MD;  Location: [facility];  Service: Urology;  Laterality: N/A;  . RIGHT/LEFT HEART CATH AND CORONARY ANGIOGRAPHY N/A 01/05/2017   Procedure: Right/Left Heart Cath and Coronary Angiography;  Surgeon: [name], MD;  Location: [place] CV LAB;  Service: Cardiovascular;  Laterality: N/A;  . TRANSTHORACIC ECHOCARDIOGRAM  05/06/2017   ef 45-50%,  grade 1 diastolic dysfunction/  AV severe calcified non coronary cusp with moderate regurg. , no stenosis (valve area per echo 01-05-2017 1.08cm^2)/  mild MR, TR, and PR/  mild LAE     Current Outpatient Medications  Medication Sig Dispense Refill  . aspirin EC 81 MG tablet Take 81 mg by mouth daily.    Marland Kitchen atorvastatin (LIPITOR) 80 MG tablet Take 1 tablet (80 mg total) by mouth daily. To lower cholesterol 90 tablet 2  . fentaNYL (DURAGESIC) 12 MCG/HR Place 1 patch onto the skin every 3 (three) days. 10 patch 0  . metoprolol succinate (TOPROL-XL) 25 MG 24 hr tablet Take 0.5 tablets (12.5 mg total) by mouth daily. 45 tablet 3  . Multiple Vitamin (MULTIVITAMIN) tablet Take 1 tablet by mouth daily.    . sacubitril-valsartan (ENTRESTO) 24-26 MG Take 1 tablet by mouth 2 (two) times daily. 180 tablet 3  . tiotropium (SPIRIVA HANDIHALER) 18 MCG inhalation capsule Place 1 capsule (18 mcg total) into inhaler and inhale daily. 30 capsule 12   No  current facility-administered medications for this visit.   Facility-Administered Medications Ordered in Other Visits  Medication Dose Route Frequency Provider Last Rate Last Admin  . topical emolient (BIAFINE) emulsion   Topical Daily Kyung Rudd, MD   Given at 01/19/14 8099    Allergies:   Patient has no known allergies.    Social History:  The patient  reports that he quit smoking about 10 years ago. His smoking use included cigarettes. He started smoking about 59 years ago. He has a 40.00 pack-year smoking history. He quit smokeless tobacco use about 20 years ago.  His smokeless tobacco use included chew. He reports previous alcohol use. He reports that he does not use drugs.   Family History:  The patient's family history includes Breast  cancer in his mother.    ROS:  Please see the history of present illness.   Otherwise, review of systems are positive for none.   All other systems are reviewed and negative.    PHYSICAL EXAM: VS:  BP 122/77   Pulse 92   Temp (!) 97.2 F (36.2 C)   Ht 5\' 6"  (1.676 m)   Wt 131 lb 9.6 oz (59.7 kg)   SpO2 98%   BMI 21.24 kg/m  , BMI Body mass index is 21.24 kg/m. GENERAL:  Frail.  No acute distress HEENT: Pupils equal round and reactive, fundi not visualized, oral mucosa unremarkable NECK:  No jugular venous distention, waveform within normal limits, carotid upstroke brisk and symmetric, no bruits LUNGS:  Clear to auscultation bilaterally HEART:  RRR.  PMI not displaced or sustained,S1 and S2 within normal limits, no S3, no S4, no clicks, no rubs, III/VI diastolic murmur at the LUSB.  III/VI holosystolic murmur at the apex ABD:  Flat, positive bowel sounds normal in frequency in pitch, no bruits, no rebound, no guarding, no midline pulsatile mass, no hepatomegaly, no splenomegaly EXT:  2 plus pulses throughout, no edema, no cyanosis no clubbing SKIN:  No rashes no nodules NEURO:  Cranial nerves II through XII grossly intact, motor grossly  intact throughout PSYCH:  Cognitively intact, oriented to person place and time   EKG:  EKG is ordered today. 07/30/17: Sinus rhythm.  Rate 83 bpm.  Lateral TWI.  05/17/20: Sinus rhythm.  Rate 92 bpm.  Poor R wave progression.  LVH.  Nonspecific T wave abnormalities.  Echo 05/06/17: Study Conclusions  - Left ventricle: Diffuse hypokinesis slightly worse in inferior   wall. The cavity size was mildly dilated. Systolic function was   mildly reduced. The estimated ejection fraction was in the range   of 45% to 50%. Doppler parameters are consistent with abnormal   left ventricular relaxation (grade 1 diastolic dysfunction). - Aortic valve: Severely calcified non coronary cusp. There was   moderate regurgitation. - Mitral valve: There was mild regurgitation. - Left atrium: The atrium was mildly dilated. - Atrial septum: No defect or patent foramen ovale was identified.  Echo 01/03/17: Study Conclusions  - Left ventricle: The cavity size was normal. Wall thickness was   normal. The estimated ejection fraction was in the range of 10%   to 15%. Diffuse hypokinesis. DIastolic function is abnormal,   indeterminant grade. - Aortic valve: There was moderate regurgitation. Valve area (VTI):   1.08 cm^2. Valve area (Vmax): 1.5 cm^2. Valve area (Vmean): 1.5   cm^2. - Mitral valve: There was moderate regurgitation. - Left atrium: The atrium was mildly dilated. - Right ventricle: Systolic function was mildly to moderately   reduced. - Tricuspid valve: There was moderate regurgitation. - Pulmonary arteries: Systolic pressure was moderately increased.   PA peak pressure: 49 mm Hg (S).  LHC 01/05/17:  Prox RCA to Mid RCA lesion, 99 %stenosed.  Mid RCA lesion, 30 %stenosed.  Dist RCA lesion, 40 %stenosed.  RPDA lesion, 20 %stenosed.  Prox Cx lesion, 100 %stenosed.  Ost Ramus to Ramus lesion, 50 %stenosed.  Ost 3rd Diag to 3rd Diag lesion, 30 %stenosed.  Mid LAD lesion, 40  %stenosed.  Ost LAD to Prox LAD lesion, 20 %stenosed.  Ost LM to LM lesion, 40 %stenosed.  Hemodynamic findings consistent with mild pulmonary hypertension.   1. Severe calcific disease in the proximal to mid RCA 2. 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 Moderate  left main and mid LAD stenosis 4. Elevated filling pressures   Recent Labs: 04/17/2020: ALT 23; BUN 17; Creatinine 1.43; Hemoglobin 13.9; Platelet Count 171; Potassium 5.4; Sodium 137 05/17/2020: BNP 3,671.5    Lipid Panel    Component Value Date/Time   CHOL 230 (H) 01/03/2020 1502   TRIG 64 01/03/2020 1502   HDL 72 01/03/2020 1502   CHOLHDL 3.2 01/03/2020 1502   LDLCALC 147 (H) 01/03/2020 1502      Wt Readings from Last 3 Encounters:  05/17/20 131 lb 9.6 oz (59.7 kg)  05/09/20 129 lb (58.5 kg)  04/19/20 128 lb 2 oz (58.1 kg)     ASSESSMENT AND PLAN:  # Chronic systolic and diastolic heart failure:  # Moderate MR: # Moderate AR:  LVEF significantly improved from 10-15% to 45-50%.  For the last several days he has been more short of breath.  Spiriva is not helping.  I'm concerned that this is heart failure, though he doesn't appear very volume overloaded on exam and CXR is without edema.  He is taking Entresto daily.  We will switch this to bid.  Check BNP and get an echo.  Continue metoprolol.    # Obstructive CAD:  # Hyperlipidemia:  Medically managed due to acute illness at the time of diagnosis.  100% LCx and 99% RCA.  The RCA would require hemodynamic support.  He has no chest pain so we will continue medical management for now. Continue aspirin and atorvastatin.  He has no chest pain but has complex coronary disease.  If the above echo HF work up is negative consider cath.   Current medicines are reviewed at length with the patient today.  The patient does not have concerns regarding medicines.  The following changes have been made:  Entresto bid.  Labs/ tests ordered today  include:  Orders Placed This Encounter  Procedures  . B Nat Peptide  . EKG 12-Lead  . ECHOCARDIOGRAM COMPLETE     Disposition:   FU with Arleigh Odowd C. Oval Linsey, MD, [facility] in 6 months.  APP in 4-6 weeks.    Signed, Jamarr Treinen C. Oval Linsey, MD, [facility]  05/19/2020 1:49 PM    [facility]

## 2020-05-17 NOTE — Telephone Encounter (Signed)
-----   Message from Erlene Quan, [place] sent at 05/17/2020  8:52 AM EDT ----- Please make an appointment for this patient to see myself or Dr Oval Linsey in the office ASAP- he has had increasing SOB- office visit only, no virtual.  Thanks  Kerin Ransom PA-C 05/17/2020 8:53 AM

## 2020-05-17 NOTE — Patient Instructions (Signed)
Medication Instructions:  START TAKING YOUR ENTRESTO TWICE A DAY   *If you need a refill on your cardiac medications before your next appointment, please call your pharmacy*  Lab Work: BNP TODAY   If you have labs (blood work) drawn today and your tests are completely normal, you will receive your results only by: Marland Kitchen MyChart Message (if you have MyChart) OR . A paper copy in the mail If you have any lab test that is abnormal or we need to change your treatment, we will call you to review the results.  Testing/Procedures: Your physician has requested that you have an echocardiogram. Echocardiography is a painless test that uses sound waves to create images of your heart. It provides your doctor with information about the size and shape of your heart and how well your heart's chambers and valves are working. This procedure takes approximately one hour. There are no restrictions for this procedure. AT HOSPITAL   Follow-Up: At [facility], you and your health needs are our priority.  As part of our continuing mission to provide you with exceptional heart care, we have created designated Provider Care Teams.  These Care Teams include your primary Cardiologist (physician) and Advanced Practice Providers (APPs -  Physician Assistants and Nurse Practitioners) who all work together to provide you with the care you need, when you need it.  We recommend signing up for the patient portal called "MyChart".  Sign up information is provided on this After Visit Summary.  MyChart is used to connect with patients for Virtual Visits (Telemedicine).  Patients are able to view lab/test results, encounter notes, upcoming appointments, etc.  Non-urgent messages can be sent to your provider as well.   To learn more about what you can do with MyChart, go to [email or web address].    Your next appointment:   2 week(s)  The format for your next appointment:   In Person  Provider:   You may see Skeet Latch, MD or one of the following Advanced Practice Providers on your designated Care Team:    Kerin Ransom, PA-C  [place], [place]  Coletta Memos, [place]

## 2020-05-18 LAB — BRAIN NATRIURETIC PEPTIDE: BNP: 3671.5 pg/mL — ABNORMAL HIGH (ref 0.0–100.0)

## 2020-05-19 ENCOUNTER — Encounter: Payer: Self-pay | Admitting: Cardiovascular Disease

## 2020-05-19 ENCOUNTER — Other Ambulatory Visit: Payer: Self-pay | Admitting: Cardiovascular Disease

## 2020-05-19 MED ORDER — FUROSEMIDE 40 MG PO TABS
40.0000 mg | ORAL_TABLET | Freq: Every day | ORAL | 2 refills | Status: DC
Start: 2020-05-19 — End: 2020-08-16

## 2020-05-19 NOTE — Progress Notes (Signed)
Per chart, patient has a new primary care provider, Lamar Blinks, MD.  Please make sure a new primary care provider information is attached to patient/patient's chart.

## 2020-05-19 NOTE — Progress Notes (Signed)
BNP elevated.  Spoke to patient and called his daughter.

## 2020-05-20 ENCOUNTER — Telehealth: Payer: Self-pay | Admitting: *Deleted

## 2020-05-20 DIAGNOSIS — Z5181 Encounter for therapeutic drug level monitoring: Secondary | ICD-10-CM

## 2020-05-20 NOTE — Telephone Encounter (Signed)
-----   Message from Skeet Latch, MD sent at 05/19/2020  2:01 PM EDT ----- BNP very elevated.  Spoke to patient can left VM for his daughter.  He is feeling better after increasing Entresto to bid.  He will get lasix from pharmacy.  40mg  daily.  Check BMP at hte end of the week.

## 2020-05-20 NOTE — Telephone Encounter (Signed)
Orders for BMET placed

## 2020-05-22 ENCOUNTER — Other Ambulatory Visit: Payer: Self-pay

## 2020-05-22 ENCOUNTER — Ambulatory Visit (HOSPITAL_COMMUNITY)
Admission: RE | Admit: 2020-05-22 | Discharge: 2020-05-22 | Disposition: A | Discharge status: Home / Self Care | Payer: Medicare Other | Source: Ambulatory Visit | Attending: Cardiovascular Disease | Admitting: Cardiovascular Disease

## 2020-05-22 DIAGNOSIS — I110 Hypertensive heart disease with heart failure: Secondary | ICD-10-CM | POA: Diagnosis not present

## 2020-05-22 DIAGNOSIS — R0602 Shortness of breath: Secondary | ICD-10-CM

## 2020-05-22 DIAGNOSIS — I509 Heart failure, unspecified: Secondary | ICD-10-CM | POA: Diagnosis not present

## 2020-05-22 DIAGNOSIS — J449 Chronic obstructive pulmonary disease, unspecified: Secondary | ICD-10-CM | POA: Diagnosis not present

## 2020-05-22 DIAGNOSIS — E785 Hyperlipidemia, unspecified: Secondary | ICD-10-CM | POA: Diagnosis not present

## 2020-05-22 DIAGNOSIS — I351 Nonrheumatic aortic (valve) insufficiency: Secondary | ICD-10-CM | POA: Diagnosis not present

## 2020-05-22 DIAGNOSIS — I2510 Atherosclerotic heart disease of native coronary artery without angina pectoris: Secondary | ICD-10-CM | POA: Diagnosis not present

## 2020-05-22 LAB — ECHOCARDIOGRAM COMPLETE
Area-P 1/2: 2.54 cm2
P 1/2 time: 418 msec
S' Lateral: 4.6 cm

## 2020-05-22 NOTE — Progress Notes (Signed)
Echocardiogram 2D Echocardiogram has been performed.  Collin Young 05/22/2020, 1:38 PM

## 2020-05-29 ENCOUNTER — Encounter: Payer: Self-pay | Admitting: Cardiovascular Disease

## 2020-05-29 ENCOUNTER — Other Ambulatory Visit: Payer: Self-pay

## 2020-05-29 ENCOUNTER — Ambulatory Visit (INDEPENDENT_AMBULATORY_CARE_PROVIDER_SITE_OTHER): Payer: Medicare Other | Admitting: Cardiovascular Disease

## 2020-05-29 VITALS — BP 115/60 | HR 81 | Temp 97.3°F | Ht 66.0 in | Wt 125.8 lb

## 2020-05-29 DIAGNOSIS — I5043 Acute on chronic combined systolic (congestive) and diastolic (congestive) heart failure: Secondary | ICD-10-CM

## 2020-05-29 DIAGNOSIS — R350 Frequency of micturition: Secondary | ICD-10-CM

## 2020-05-29 DIAGNOSIS — I2510 Atherosclerotic heart disease of native coronary artery without angina pectoris: Secondary | ICD-10-CM | POA: Diagnosis not present

## 2020-05-29 DIAGNOSIS — Z5181 Encounter for therapeutic drug level monitoring: Secondary | ICD-10-CM

## 2020-05-29 DIAGNOSIS — I255 Ischemic cardiomyopathy: Secondary | ICD-10-CM | POA: Diagnosis not present

## 2020-05-29 DIAGNOSIS — N183 Chronic kidney disease, stage 3 unspecified: Secondary | ICD-10-CM

## 2020-05-29 MED ORDER — EMPAGLIFLOZIN 10 MG PO TABS
10.0000 mg | ORAL_TABLET | Freq: Every day | ORAL | 5 refills | Status: DC
Start: 2020-05-29 — End: 2020-09-23

## 2020-05-29 NOTE — Patient Instructions (Signed)
Medication Instructions:  START JARDIANCE 10 MG DAILY   *If you need a refill on your cardiac medications before your next appointment, please call your pharmacy*  Lab Work: BMET [place]   If you have labs (blood work) drawn today and your tests are completely normal, you will receive your results only by: Marland Kitchen MyChart Message (if you have MyChart) OR . A paper copy in the mail If you have any lab test that is abnormal or we need to change your treatment, we will call you to review the results.  Testing/Procedures: NONE  Follow-Up: At [facility], you and your health needs are our priority.  As part of our continuing mission to provide you with exceptional heart care, we have created designated Provider Care Teams.  These Care Teams include your primary Cardiologist (physician) and Advanced Practice Providers (APPs -  Physician Assistants and Nurse Practitioners) who all work together to provide you with the care you need, when you need it.  We recommend signing up for the patient portal called "MyChart".  Sign up information is provided on this After Visit Summary.  MyChart is used to connect with patients for Virtual Visits (Telemedicine).  Patients are able to view lab/test results, encounter notes, upcoming appointments, etc.  Non-urgent messages can be sent to your provider as well.   To learn more about what you can do with MyChart, go to [email or web address].    Your next appointment:   3 month(s)  The format for your next appointment:   In Person  Provider:   You may see Skeet Latch, MD or one of the following Advanced Practice Providers on your designated Care Team:    Kerin Ransom, PA-C  [place], [place]  Coletta Memos, [place]

## 2020-05-29 NOTE — Progress Notes (Signed)
Cardiology Office Note   Date:  05/29/2020   ID:  Collin Young, DOB 1950-12-15, MRN [id number]  PCP:  Collin Mclean, MD  Cardiologist:   Collin Latch, MD   No chief complaint on file.   History of Present Illness: Collin Young is a 69 y.o. 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  Collin Young was admitted 12/2016 with progressive shortness of breath.  BNP was >2000.  Echo revealed LVEF 10-15% with diffuse hypokinesis, moderate MR and moderate AR.  PASP was 49 mmHg.  He underwent left heart catheterization and was found to have 100% proximal LCx with L-->L collaterals, 99% prox-mid RCA and otherwise mild-moderate CAD.  There was consideration of RCA PCI.  However, this would have required hemodynamic support and was deferred given his frailty and comorbidities.  He was treated with milrinone and IV lasix.  Milrinone was weaned prior to discharge.  During that hospitalization he was also treated for MSSA bacteremia.  He was unable to get a TEE due to his prior cancer.  His Port was removed and a PICC line was placed.   He had a  PEG tube placed due to frank aspiration.  At his last appointment Collin Young was referred for an echo 05/2017 that showed a significant improvement in his LVEF to 45-50% and grade 1 diastolic dysfunction.  There was moderate aortic regurgitation.  He underwent radiation of the prostate.  Collin Young was seen 05/2020 and reported heart failure symptoms.  He had no edema but reported orthopnea, profound shortness of breath, and PND.  At that time he was started on Entresto.  BNP at that appointment was markedly elevated to 3671.  He was called after the visit and was feeling much better.  He reports that he continues to feel well.  He no longer has orthopnea or PND.  His weight  is down 6 pounds.  He is very happy to be feeling so much better.  He has not had any chest pain or pressure.  He denies any lightheadedness or dizziness and feels that his breathing is back to baseline.  He does report some mild discomfort with urination but denies foul-smelling urine or hematuria.  He was referred for an echo that revealed LVEF 35 to 40% with grade 1 diastolic dysfunction.  There is severe hypokinesis of the basal to mid inferoseptal, inferior, and inferolateral myocardium.  Past Medical History:  Diagnosis Date  . Chronic combined systolic and diastolic CHF (congestive heart failure) ([place])    followed by Collin Young  . COPD (chronic obstructive pulmonary disease) ([place])   . Coronary artery disease cardiologist-- Collin Young   per cardiac cath 01-05-2017  chronic total occlusion pLCx with collaterals, 99% severe calcified prox. to mid RCA, otherwise mild to moderate CAD (medically managed)  . Esophageal cancer, stage IIIB [facility]) oncologist-  Collin Young/  Collin Young   dx 2010  SCC Stage IIIB completed chemoradiation;  localized recurrent left piriform sinus 01/ 2015,  completed concurrent chemoradiatoin 04/ 2015  . GERD (gastroesophageal reflux disease)    09-07-2018   no issues since Gtube removed 06/ 2019  . Headache   . History of alcohol abuse    quit 2001  . History of cancer chemotherapy    2010;   2015  .  History of radiation therapy    10-19-2013 to  12-05-2013 pyriform sinus 69.96 Gy/40fx;   Radiation completed 2010 for esophageal cancer  . History of seizure 2001   alcohol withdrawal  . HOH (hard of hearing)   . Hyperlipidemia   . Hypertension   . Ischemic cardiomyopathy 12/2016   01-03-2017  echo,  ef 10-15%/   echo 05-06-2017 EF improved to 45-50%  . Lower urinary tract symptoms (LUTS)   . Prostate cancer [facility]) urologist-  Collin Young/  oncologist-- Collin Young   dx 05-27-2018--- Stage T1c,  Gleason 4+3,  PSA 9.3--  scheduled for brachytherapy  09-23-2017  . Renal cyst, left   . Voice hoarseness    secondary to radiation treatment    Past Surgical History:  Procedure Laterality Date  . CARDIOVASCULAR STRESS TEST  10/09/09   normal nuclearr stress test, EF 57% Collin Young)  . IR GASTROSTOMY TUBE MOD SED  01/13/2017  . IR GASTROSTOMY TUBE REMOVAL  02/03/2018  . IR PATIENT EVAL TECH 0-60 MINS  03/25/2017  . IR REMOVAL TUN ACCESS W/ PORT W/O FL MOD SED  01/15/2017  . IR REPLACE G-TUBE SIMPLE WO FLUORO  01/13/2018  . LARYNGOSCOPY N/A 09/15/2013   Procedure: LARYNGOSCOPY;  Surgeon: Collin Quitter, MD;  Location: [place];  Service: ENT;  Laterality: N/A;  direct laryngoscopy with biopsy and esophagoscopy  . RADIOACTIVE SEED IMPLANT N/A 09/23/2018   Procedure: RADIOACTIVE SEED IMPLANT/BRACHYTHERAPY IMPLANT;  Surgeon: Collin Rhodes, MD;  Location: [facility];  Service: Urology;  Laterality: N/A;  . RIGHT/LEFT HEART CATH AND CORONARY ANGIOGRAPHY N/A 01/05/2017   Procedure: Right/Left Heart Cath and Coronary Angiography;  Surgeon: Collin Blanks, MD;  Location: [place] CV LAB;  Service: Cardiovascular;  Laterality: N/A;  . TRANSTHORACIC ECHOCARDIOGRAM  05/06/2017   ef 45-50%,  grade 1 diastolic dysfunction/  AV severe calcified non coronary cusp with moderate regurg. , no stenosis (valve area per echo 01-05-2017 1.08cm^2)/  mild MR, TR, and PR/  mild LAE     Current Outpatient Medications  Medication Sig Dispense Refill  . aspirin EC 81 MG tablet Take 81 mg by mouth daily.    Marland Kitchen atorvastatin (LIPITOR) 80 MG tablet Take 1 tablet (80 mg total) by mouth daily. To lower cholesterol 90 tablet 2  . fentaNYL (DURAGESIC) 12 MCG/HR Place 1 patch onto the skin every 3 (three) days. 10 patch 0  . furosemide (LASIX) 40 MG tablet Take 1 tablet (40 mg total) by mouth daily. 30 tablet 2  . metoprolol succinate (TOPROL-XL) 25 MG 24 hr tablet Take 0.5 tablets (12.5 mg total) by mouth daily. 45 tablet 3  . Multiple Vitamin (MULTIVITAMIN)  tablet Take 1 tablet by mouth daily.    . sacubitril-valsartan (ENTRESTO) 24-26 MG Take 1 tablet by mouth 2 (two) times daily. 180 tablet 3  . tiotropium (SPIRIVA HANDIHALER) 18 MCG inhalation capsule Place 1 capsule (18 mcg total) into inhaler and inhale daily. 30 capsule 12  . empagliflozin (JARDIANCE) 10 MG TABS tablet Take 1 tablet (10 mg total) by mouth daily before breakfast. 30 tablet 5   No current facility-administered medications for this visit.   Facility-Administered Medications Ordered in Other Visits  Medication Dose Route Frequency Provider Last Rate Last Admin  . topical emolient (BIAFINE) emulsion   Topical Daily Kyung Rudd, MD   Given at 01/19/14 7829    Allergies:   Patient has no known allergies.    Social History:  The patient  reports that  he quit smoking about 10 years ago. His smoking use included cigarettes. He started smoking about 59 years ago. He has a 40.00 pack-year smoking history. He quit smokeless tobacco use about 20 years ago.  His smokeless tobacco use included chew. He reports previous alcohol use. He reports that he does not use drugs.   Family History:  The patient's family history includes Breast cancer in his mother.    ROS:  Please see the history of present illness.   Otherwise, review of systems are positive for none.   All other systems are reviewed and negative.    PHYSICAL EXAM: VS:  BP 115/60   Pulse 81   Temp (!) 97.3 F (36.3 C)   Ht 5\' 6"  (1.676 m)   Wt 125 lb 12.8 oz (57.1 kg)   SpO2 96%   BMI 20.30 kg/m  , BMI Body mass index is 20.3 kg/m. GENERAL:  Well appearing.  Frail, elderly man. HEENT: Pupils equal round and reactive, fundi not visualized, oral mucosa unremarkable NECK:  No jugular venous distention, waveform within normal limits, carotid upstroke brisk and symmetric, no bruits LUNGS:  Clear to auscultation bilaterally HEART:  RRR.  PMI not displaced or sustained,S1 and S2 within normal limits, no S3, no S4, no clicks,  no rubs, no murmurs ABD:  Flat, positive bowel sounds normal in frequency in pitch, no bruits, no rebound, no guarding, no midline pulsatile mass, no hepatomegaly, no splenomegaly EXT:  2 plus pulses throughout, no edema, no cyanosis no clubbing SKIN:  No rashes no nodules NEURO:  Cranial nerves II through XII grossly intact, motor grossly intact throughout PSYCH:  Cognitively intact, oriented to person place and time  EKG:  EKG is not ordered today. 07/30/17: Sinus rhythm.  Rate 83 bpm.  Lateral TWI.  05/17/20: Sinus rhythm.  Rate 92 bpm.  Poor R wave progression.  LVH.  Nonspecific T wave abnormalities.  Echo 05/29/20: 1. Left ventricular ejection fraction, by estimation, is 35 to 40%. The  left ventricle has moderately decreased function. The left ventricle  demonstrates regional wall motion abnormalities (see scoring  diagram/findings for description). Left ventricular  diastolic parameters are consistent with Grade I diastolic dysfunction  (impaired relaxation). There is severe hypokinesis of the left  ventricular, basal-mid inferoseptal wall, inferior wall and inferolateral  wall.  2. Right ventricular systolic function is normal. The right ventricular  size is normal. There is mildly elevated pulmonary artery systolic  pressure.  3. Left atrial size was mildly dilated.  4. The mitral valve is normal in structure. Trivial mitral valve  regurgitation.  5. The aortic valve is tricuspid. There is mild calcification of the  aortic valve. There is mild thickening of the aortic valve. Aortic valve  regurgitation is mild to moderate. Mild aortic valve sclerosis is present,  with no evidence of aortic valve  stenosis.   Echo 05/06/17: Study Conclusions  - Left ventricle: Diffuse hypokinesis slightly worse in inferior   wall. The cavity size was mildly dilated. Systolic function was   mildly reduced. The estimated ejection fraction was in the range   of 45% to 50%. Doppler  parameters are consistent with abnormal   left ventricular relaxation (grade 1 diastolic dysfunction). - Aortic valve: Severely calcified non coronary cusp. There was   moderate regurgitation. - Mitral valve: There was mild regurgitation. - Left atrium: The atrium was mildly dilated. - Atrial septum: No defect or patent foramen ovale was identified.  Echo 01/03/17: Study Conclusions  -  Left ventricle: The cavity size was normal. Wall thickness was   normal. The estimated ejection fraction was in the range of 10%   to 15%. Diffuse hypokinesis. DIastolic function is abnormal,   indeterminant grade. - Aortic valve: There was moderate regurgitation. Valve area (VTI):   1.08 cm^2. Valve area (Vmax): 1.5 cm^2. Valve area (Vmean): 1.5   cm^2. - Mitral valve: There was moderate regurgitation. - Left atrium: The atrium was mildly dilated. - Right ventricle: Systolic function was mildly to moderately   reduced. - Tricuspid valve: There was moderate regurgitation. - Pulmonary arteries: Systolic pressure was moderately increased.   PA peak pressure: 49 mm Hg (S).  LHC 01/05/17:  Prox RCA to Mid RCA lesion, 99 %stenosed.  Mid RCA lesion, 30 %stenosed.  Dist RCA lesion, 40 %stenosed.  RPDA lesion, 20 %stenosed.  Prox Cx lesion, 100 %stenosed.  Ost Ramus to Ramus lesion, 50 %stenosed.  Ost 3rd Diag to 3rd Diag lesion, 30 %stenosed.  Mid LAD lesion, 40 %stenosed.  Ost LAD to Prox LAD lesion, 20 %stenosed.  Ost LM to LM lesion, 40 %stenosed.  Hemodynamic findings consistent with mild pulmonary hypertension.   1. Severe calcific disease in the proximal to mid RCA 2. 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 Moderate left main and mid LAD stenosis 4. Elevated filling pressures   Recent Labs: 04/17/2020: ALT 23; BUN 17; Creatinine 1.43; Hemoglobin 13.9; Platelet Count 171; Potassium 5.4; Sodium 137 05/17/2020: BNP 3,671.5    Lipid Panel    Component Value Date/Time    CHOL 230 (H) 01/03/2020 1502   TRIG 64 01/03/2020 1502   HDL 72 01/03/2020 1502   CHOLHDL 3.2 01/03/2020 1502   LDLCALC 147 (H) 01/03/2020 1502      Wt Readings from Last 3 Encounters:  05/29/20 125 lb 12.8 oz (57.1 kg)  05/17/20 131 lb 9.6 oz (59.7 kg)  05/09/20 129 lb (58.5 kg)     ASSESSMENT AND PLAN:  # Chronic systolic and diastolic heart failure:  # Moderate MR: # Moderate AR:  LVEF significantly improved from 10-15% to 45-50%.  However at his last appointment he had worse heart failure symptoms and was referred for an echo that showed his EF has reduced to 35 to 40%.  He is doing much better symptomatically on Entresto.  There is any room to adjust his Entresto further.  Continue metoprolol and Lasix.  We will check a basic metabolic panel today.  He does appear to be euvolemic.  Given that he does not have any ischemic symptoms we will not refer him for cardiac catheterization again at this time. We will start Jardiance. Check U/A today given his report of dysuria.  He was advised that there is a risk of urinary tract and mycotic infections.  # Obstructive CAD:  # Hyperlipidemia:  Medically managed due to acute illness at the time of diagnosis.  100% LCx and 99% RCA.  The RCA would require hemodynamic support.  He has no chest pain so we will continue medical management for now. Continue aspirin and atorvastatin.  He has no chest pain but has complex coronary disease.  He is still quite frail and would be high risk for surgery.  Would only consider for refractory symptoms of ischemia or heart failure.    Current medicines are reviewed at length with the patient today.  The patient does not have concerns regarding medicines.  The following changes have been made:  Entresto bid.  Labs/ tests ordered today  include:  Orders Placed This Encounter  Procedures  . Basic metabolic panel  . Urinalysis     Disposition:   FU with Kathalene Sporer C. Oval Linsey, MD, [facility] in 3 months.     Signed, Kamori Barbier C. Oval Linsey, MD, [facility]  05/29/2020 4:49 PM    [place]

## 2020-05-30 LAB — URINALYSIS
Bilirubin, UA: NEGATIVE
Glucose, UA: NEGATIVE
Ketones, UA: NEGATIVE
Leukocytes,UA: NEGATIVE
Nitrite, UA: NEGATIVE
Protein,UA: NEGATIVE
RBC, UA: NEGATIVE
Specific Gravity, UA: 1.011 (ref 1.005–1.030)
Urobilinogen, Ur: 1 mg/dL (ref 0.2–1.0)
pH, UA: 6 (ref 5.0–7.5)

## 2020-05-30 LAB — BASIC METABOLIC PANEL
BUN/Creatinine Ratio: 18 (ref 10–24)
BUN: 23 mg/dL (ref 8–27)
CO2: 27 mmol/L (ref 20–29)
Calcium: 9.3 mg/dL (ref 8.6–10.2)
Chloride: 92 mmol/L — ABNORMAL LOW (ref 96–106)
Creatinine, Ser: 1.26 mg/dL (ref 0.76–1.27)
GFR calc Af Amer: 67 mL/min/{1.73_m2} (ref >59)
GFR calc non Af Amer: 58 mL/min/{1.73_m2} — ABNORMAL LOW (ref >59)
Glucose: 89 mg/dL (ref 65–99)
Potassium: 5.1 mmol/L (ref 3.5–5.2)
Sodium: 135 mmol/L (ref 134–144)

## 2020-06-03 ENCOUNTER — Other Ambulatory Visit: Payer: Self-pay

## 2020-06-03 DIAGNOSIS — C12 Malignant neoplasm of pyriform sinus: Secondary | ICD-10-CM

## 2020-06-03 DIAGNOSIS — C61 Malignant neoplasm of prostate: Secondary | ICD-10-CM

## 2020-06-03 MED ORDER — FENTANYL 12 MCG/HR TD PT72: 1.0000 | MEDICATED_PATCH | TRANSDERMAL | 0 refills | Status: DC

## 2020-06-07 ENCOUNTER — Other Ambulatory Visit: Payer: Self-pay

## 2020-06-07 ENCOUNTER — Encounter: Payer: Self-pay | Admitting: Internal Medicine

## 2020-06-07 ENCOUNTER — Ambulatory Visit (INDEPENDENT_AMBULATORY_CARE_PROVIDER_SITE_OTHER): Payer: Medicare Other | Admitting: Internal Medicine

## 2020-06-07 VITALS — BP 120/70 | HR 78 | Temp 97.3°F | Ht 63.0 in | Wt 126.1 lb

## 2020-06-07 DIAGNOSIS — J449 Chronic obstructive pulmonary disease, unspecified: Secondary | ICD-10-CM

## 2020-06-07 MED ORDER — ALBUTEROL SULFATE HFA 108 (90 BASE) MCG/ACT IN AERS: 2.0000 | INHALATION_SPRAY | Freq: Four times a day (QID) | RESPIRATORY_TRACT | 5 refills | Status: DC | PRN

## 2020-06-07 NOTE — Progress Notes (Signed)
Collin Young    [id number]    May 22, 1951  Primary Care Physician:Copland, Gay Filler, MD  Referring Physician: Darreld Mclean, [address] [place],  [place] [address] Reason for Consultation: COPD Date of Consultation: 06/07/2020  Chief complaint:   Chief Complaint  Patient presents with  . Consult    copd evaluation     HPI: Patient is a very pleasant 69 year old gentleman who is extremely hard of hearing despite his hearing aids.  He is here at the request of Dr. Edilia Bo for evaluation of COPD.  History is obtained from chart review, discussion with his daughter [name] over the phone.  He gets short of breath and has been prescribed an albuterol inhaler which he has used twice which he basically stopped using because it did not do much.  He still continues to have some dyspnea on exertion per his daughter.  He additionally has a very complicated cardiac history including recent hospital stay for heart failure reduced ejection fraction was EF is only 10%.  He was diuresed with Lasix extensively, he underwent cardiac revascularization with improvement in his ejection fraction to 45%.  He was also briefly on vasoactive agents with milrinone.  He recently saw Dr. Oval Linsey with cardiology and is doing better.  He appears euvolemic today.  Social history: Smoking history: Former smoker, quit in 2011  Social History   Occupational History  . Occupation: retired  Tobacco Use  . Smoking status: Former Smoker    Packs/day: 1.00    Years: 40.00    Pack years: 40.00    Types: Cigarettes    Start date: 11/08/1960    Quit date: 08/31/2009    Years since quitting: 10.7  . Smokeless tobacco: Former Systems developer    Types: Chew    Quit date: 09/01/1999  Vaping Use  . Vaping Use: Never used  Substance and Sexual Activity  . Alcohol use: Not Currently    Alcohol/week: 0.0 standard drinks    Comment: quit in 2001  . Drug use: No    Comment: back in the day used  cocaine,alcohol, marijuana  . Sexual activity: Yes    Partners: Female    Birth control/protection: None    Relevant family history:  Family History  Problem Relation Age of Onset  . Breast cancer Mother   . Prostate cancer Neg Hx   . Colon cancer Neg Hx   . Pancreatic cancer Neg Hx     Past Medical History:  Diagnosis Date  . Chronic combined systolic and diastolic CHF (congestive heart failure) ([place])    followed by [name]  . COPD (chronic obstructive pulmonary disease) ([place])   . Coronary artery disease cardiologist-- [name]   per cardiac cath 01-05-2017  chronic total occlusion pLCx with collaterals, 99% severe calcified prox. to mid RCA, otherwise mild to moderate CAD (medically managed)  . Esophageal cancer, stage IIIB [facility]) oncologist-  dr ennever/  dr moody   dx 2010  SCC Stage IIIB completed chemoradiation;  localized recurrent left piriform sinus 01/ 2015,  completed concurrent chemoradiatoin 04/ 2015  . GERD (gastroesophageal reflux disease)    09-07-2018   no issues since Gtube removed 06/ 2019  . Headache   . History of alcohol abuse    quit 2001  . History of cancer chemotherapy    2010;   2015  . History of radiation therapy    10-19-2013 to  12-05-2013 pyriform sinus 69.96 Gy/13fx;   Radiation completed 2010 for esophageal cancer  . History of seizure 2001   alcohol withdrawal  . HOH (hard of hearing)   . Hyperlipidemia   . Hypertension   . Ischemic cardiomyopathy 12/2016   01-03-2017  echo,  ef 10-15%/   echo 05-06-2017 EF improved to 45-50%  . Lower urinary tract symptoms (LUTS)   . Prostate cancer [facility]) urologist-  dr ottelin/  oncologist-- [name]   dx 05-27-2018--- Stage T1c,  Gleason 4+3,  PSA 9.3--  scheduled for brachytherapy 09-23-2017  . Renal cyst, left   . Voice hoarseness    secondary to radiation treatment    Past Surgical History:  Procedure Laterality Date  . CARDIOVASCULAR STRESS TEST  10/09/09   normal nuclearr  stress test, EF 57% [name])  . IR GASTROSTOMY TUBE MOD SED  01/13/2017  . IR GASTROSTOMY TUBE REMOVAL  02/03/2018  . IR PATIENT EVAL TECH 0-60 MINS  03/25/2017  . IR REMOVAL TUN ACCESS W/ PORT W/O FL MOD SED  01/15/2017  . IR REPLACE G-TUBE SIMPLE WO FLUORO  01/13/2018  . LARYNGOSCOPY N/A 09/15/2013   Procedure: LARYNGOSCOPY;  Surgeon: [name], MD;  Location: [place];  Service: ENT;  Laterality: N/A;  direct laryngoscopy with biopsy and esophagoscopy  . RADIOACTIVE SEED IMPLANT N/A 09/23/2018   Procedure: RADIOACTIVE SEED IMPLANT/BRACHYTHERAPY IMPLANT;  Surgeon: [name], MD;  Location: [facility];  Service: Urology;  Laterality: N/A;  . RIGHT/LEFT HEART CATH AND CORONARY ANGIOGRAPHY N/A 01/05/2017   Procedure: Right/Left Heart Cath and Coronary Angiography;  Surgeon: [name], MD;  Location: [place] CV LAB;  Service: Cardiovascular;  Laterality: N/A;  . TRANSTHORACIC ECHOCARDIOGRAM  05/06/2017   ef 45-50%,  grade 1 diastolic dysfunction/  AV severe calcified non coronary cusp with moderate regurg. , no stenosis (valve area per echo 01-05-2017 1.08cm^2)/  mild MR, TR, and PR/  mild LAE     Physical Exam: Blood pressure 120/70, pulse 78, temperature (!) 97.3 F (36.3 C), temperature source Oral, height 5\' 3"  (1.6 m), weight 126 lb 2 oz (57.2 kg), SpO2 98 %. Gen:      No acute distress, thin, appears older than stated age ENT:     Hoarse voice, well-healed tracheostomy scar Lungs:    No increased respiratory effort, symmetric chest wall excursion, diminished breath sounds bilaterally, no wheezes or crackles CV:         Regular rate and rhythm; no murmurs, rubs, or gallops.  No pedal edema Abd:      + bowel sounds; soft, non-tender; no distension MSK: no acute synovitis of DIP or PIP joints, no mechanics hands.  Skin:      Warm and dry; no rashes Neuro: normal speech, no focal facial asymmetry Psych: alert and oriented x3, normal mood and affect   Data  Reviewed/Medical Decision Making:  Independent interpretation of tests: Imaging: . Review of patient's chest x-ray from August 18 images revealed trace right-sided pleural effusion, hyperinflation. The patient's images have been independently reviewed by me.    PFTs: None on file  Labs:  Lab Results  Component Value Date   WBC 3.1 (L) 04/17/2020   HGB 13.9 04/17/2020   HCT 43.9 04/17/2020   MCV 88.2 04/17/2020   PLT 171 04/17/2020   Lab Results  Component Value Date   NA 135 05/29/2020   K 5.1 05/29/2020   CL 92 (L) 05/29/2020   CO2 27 05/29/2020  Immunization status:  Immunization History  Administered Date(s) Administered  . Influenza,inj,Quad PF,6+ Mos 05/23/2019  . Influenza-Unspecified 05/31/2017  . Tdap 12/18/2011    . I reviewed prior external note(s) from cardiology, hospital stay . I reviewed the result(s) of the labs and imaging as noted above.  . I have ordered PFT   Assessment:  COPD Heart failure reduced ejection fraction EF 45% Ischemic cardiomyopathy Esophageal cancer status post resection and chemoradiation  Plan/Recommendations: We will obtain some spirometry to better understand his lung disease.  Difficult to ascertain whether his dyspnea is more related to his heart disease or his lungs.  This will help. As far is not helping him he can stop this.  I would recommend a trial of albuterol as needed to see if this helps his episodic dyspnea.  I discussed this with his daughter [name].  I spent 70 minutes in the care of this patient today including pre-charting, chart review, review of results, face-to-face care, coordination of care and communication with consultants etc.).  Return to Care: Return in about 2 months (around 08/07/2020).  Lenice Llamas, MD Pulmonary and [place]  CC: Copland, Gay Filler, MD

## 2020-06-07 NOTE — Patient Instructions (Signed)
The patient should have follow up scheduled with myself in 2 months.   Prior to next visit patient should have: Spirometry   I am starting you on a new inhaler called albuterol. You can stop the spiriva if it is not helping.   Take the albuterol rescue inhaler every 4 to 6 hours as needed for wheezing or shortness of breath. You can also take it 15 minutes before exercise or exertional activity. Side effects include heart racing or pounding, jitters or anxiety. If you have a history of an irregular heart rhythm, it can make this worse. Can also give some patients a hard time sleeping.  To inhale the aerosol using an inhaler, follow these steps:  1. Remove the protective dust cap from the end of the mouthpiece. If the dust cap was not placed on the mouthpiece, check the mouthpiece for dirt or other objects. Be sure that the canister is fully and firmly inserted in the mouthpiece. 2. If you are using the inhaler for the first time or if you have not used the inhaler in more than 14 days, you will need to prime it. You may also need to prime the inhaler if it has been dropped. Ask your pharmacist or check the manufacturer's information if this happens. To prime the inhaler, shake it well and then press down on the canister 4 times to release 4 sprays into the air, away from your face. Be careful not to get albuterol in your eyes. 3. Shake the inhaler well. 4. Breathe out as completely as possible through your mouth. 4. Hold the canister with the mouthpiece on the bottom, facing you and the canister pointing upward. Place the open end of the mouthpiece into your mouth. Close your lips tightly around the mouthpiece. 6. Breathe in slowly and deeply through the mouthpiece.At the same time, press down once on the container to spray the medication into your mouth. 7. Try to hold your breath for 10 seconds. remove the inhaler, and breathe out slowly. 8. If you were told to use 2 puffs, wait 1 minute and  then repeat steps 3-7. 9. Replace the protective cap on the inhaler. 10. Clean your inhaler regularly. Follow the manufacturer's directions carefully and ask your doctor or pharmacist if you have any questions about cleaning your inhaler.  Check the back of the inhaler to keep track of the total number of doses left on the inhaler.     Understanding COPD   What is COPD? COPD stands for chronic obstructive pulmonary (lung) disease. COPD is a general term used for several lung diseases.  COPD is an umbrella term and encompasses other  common diseases in this group like chronic bronchitis and emphysema. Chronic asthma may also be included in this group. While some patients with COPD have only chronic bronchitis or emphysema, most patients have a combination of both.  You might hear these terms used in exchange for one another.   COPD adds to the work of the heart. Diseased lungs may reduce the amount of oxygen that goes to the blood. High blood pressure in blood vessels from the heart to the lungs makes it difficult for the heart to pump. Lung disease can also cause the body to produce too many red blood cells which may make the blood thicker and harder to pump.   Patients who have COPD with low oxygen levels may develop an enlarged heart (cor pulmonale). This condition weakens the heart and causes increased shortness of breath and  swelling in the legs and feet.   Chronic bronchitis Chronic bronchitis is irritation and inflammation (swelling) of the lining in the bronchial tubes (air passages). The irritation causes coughing and an excess amount of mucus in the airways. The swelling makes it difficult to get air in and out of the lungs. The small, hair-like structures on the inside of the airways (called cilia) may be damaged by the irritation. The cilia are then unable to help clean mucus from the airways.  Bronchitis is generally considered to be chronic when you have: a productive cough (cough up  mucus) and shortness of breath that lasts about 3 months or more each year for 2 or more years in a row. Your doctor may define chronic bronchitis differently.   Emphysema Emphysema is the destruction, or breakdown, of the walls of the alveoli (air sacs) located at the end of the bronchial tubes. The damaged alveoli are not able to exchange oxygen and carbon dioxide between the lungs and the blood. The bronchioles lose their elasticity and collapse when you exhale, trapping air in the lungs. The trapped air keeps fresh air and oxygen from entering the lungs.   Who is affected by COPD? Emphysema and chronic bronchitis affect approximately 16 million people in the [place], or close to 11 percent of the population.   Symptoms of COPD   Shortness of breath   Shortness of breath with mild exercise (walking, using the stairs, etc.)   Chronic, productive cough (with mucus)   A feeling of "tightness" in the chest   Wheezing   What causes COPD? The two primary causes of COPD are cigarette smoking and alpha1-antitrypsin (AAT) deficiency. Air pollution and occupational dusts may also contribute to COPD, especially when the person exposed to these substances is a cigarette smoker.  Cigarette smoke causes COPD by irritating the airways and creating inflammation that narrows the airways, making it more difficult to breathe. Cigarette smoke also causes the cilia to stop working properly so mucus and trapped particles are not cleaned from the airways. As a result, chronic cough and excess mucus production develop, leading to chronic bronchitis.  In some people, chronic bronchitis and infections can lead to destruction of the small airways, or emphysema.  AAT deficiency, an inherited disorder, can also lead to emphysema. Alpha antitrypsin (AAT) is a protective material produced in the liver and transported to the lungs to help combat inflammation. When there is not enough of the chemical AAT, the body  is no longer protected from an enzyme in the white blood cells.   How is COPD diagnosed?  To diagnose COPD, the physician needs to know: . Do you smoke?  . Have you had chronic exposure to dust or air pollutants?  . Do other members of your family have lung disease?  Collin Young Are you short of breath?  . Do you get short of breath with exercise?  Collin Kitchen Do you have chronic cough and/or wheezing?  Collin Kitchen Do you cough up excess mucus?  To help with the diagnosis, the physician will conduct a thorough physical exam which includes:  1. Listening to your lungs and heart  2. Checking your blood pressure and pulse  3. Examining your nose and throat  4. Checking your feet and ankles for swelling   Laboratory and other tests Several laboratory and other tests are needed to confirm a diagnosis of COPD. These tests may include:  . Chest X-ray to look for lung changes that could be caused by  COPD  .  Spirometry and pulmonary function tests (PFTs) to determine lung volume and air flow  . Pulse oximetry to measure the saturation of oxygen in the blood  . Arterial blood gases (ABGs) to determine the amount of oxygen and carbon dioxide in the blood  . Exercise testing to determine if the oxygen level in the blood drops during exercise   Treatment In the beginning stages of COPD, there is minimal shortness of breath that may be noticed only during exercise. As the disease progresses, shortness of breath may worsen and you may need to wear an oxygen device.   To help control other symptoms of COPD, the following treatments and lifestyle changes may be prescribed.  . Quitting smoking  . Avoiding cigarette smoke and other irritants  . Taking medications including: a. bronchodilators b. anti-inflammatory agents c. oxygen d. antibiotics  . Maintaining a healthy diet  . Following a structured exercise program such as pulmonary rehabilitation . Preventing respiratory infections  . Controlling stress   If your COPD  progresses, you may be eligible to be evaluated for lung volume reduction surgery or lung transplantation. You may also be eligible to participate in certain clinical trials (research studies). Ask your health care providers about studies being conducted in your hospital.   What is the outlook? Although COPD can not be cured, its symptoms can be treated and your quality of life can be improved. Your prognosis or outlook for the future will depend on how well your lungs are functioning, your symptoms, and how well you respond to and follow your treatment plan.

## 2020-07-05 ENCOUNTER — Other Ambulatory Visit: Payer: Self-pay | Admitting: Cardiovascular Disease

## 2020-07-10 ENCOUNTER — Other Ambulatory Visit: Payer: Self-pay | Admitting: *Deleted

## 2020-07-10 DIAGNOSIS — C61 Malignant neoplasm of prostate: Secondary | ICD-10-CM

## 2020-07-10 DIAGNOSIS — C12 Malignant neoplasm of pyriform sinus: Secondary | ICD-10-CM

## 2020-07-10 MED ORDER — FENTANYL 12 MCG/HR TD PT72: 1.0000 | MEDICATED_PATCH | TRANSDERMAL | 0 refills | Status: DC

## 2020-07-15 ENCOUNTER — Ambulatory Visit (INDEPENDENT_AMBULATORY_CARE_PROVIDER_SITE_OTHER): Payer: Medicare Other | Admitting: Internal Medicine

## 2020-07-15 ENCOUNTER — Other Ambulatory Visit: Payer: Self-pay

## 2020-07-15 ENCOUNTER — Other Ambulatory Visit: Payer: Self-pay | Admitting: Internal Medicine

## 2020-07-15 ENCOUNTER — Encounter: Payer: Self-pay | Admitting: Internal Medicine

## 2020-07-15 VITALS — BP 118/70 | HR 83 | Temp 97.4°F | Ht 64.0 in | Wt 128.1 lb

## 2020-07-15 DIAGNOSIS — Z23 Encounter for immunization: Secondary | ICD-10-CM

## 2020-07-15 DIAGNOSIS — J449 Chronic obstructive pulmonary disease, unspecified: Secondary | ICD-10-CM

## 2020-07-15 LAB — PULMONARY FUNCTION TEST
DL/VA % pred: 90 %
DL/VA: 3.79 ml/min/mmHg/L
DLCO cor % pred: 75 %
DLCO cor: 16.29 ml/min/mmHg
DLCO unc % pred: 75 %
DLCO unc: 16.29 ml/min/mmHg
FEF 25-75 Post: 2.2 L/sec
FEF 25-75 Pre: 2.18 L/sec
FEF2575-%Change-Post: 1 %
FEF2575-%Pred-Post: 110 %
FEF2575-%Pred-Pre: 109 %
FEV1-%Change-Post: 0 %
FEV1-%Pred-Post: 93 %
FEV1-%Pred-Pre: 93 %
FEV1-Post: 2.09 L
FEV1-Pre: 2.08 L
FEV1FVC-%Change-Post: -8 %
FEV1FVC-%Pred-Pre: 106 %
FEV6-%Change-Post: 9 %
FEV6-%Pred-Post: 98 %
FEV6-%Pred-Pre: 90 %
FEV6-Post: 2.79 L
FEV6-Pre: 2.55 L
FEV6FVC-%Change-Post: 0 %
FEV6FVC-%Pred-Post: 104 %
FEV6FVC-%Pred-Pre: 105 %
FVC-%Change-Post: 9 %
FVC-%Pred-Post: 94 %
FVC-%Pred-Pre: 85 %
FVC-Post: 2.81 L
FVC-Pre: 2.56 L
Post FEV1/FVC ratio: 74 %
Post FEV6/FVC ratio: 99 %
Pre FEV1/FVC ratio: 81 %
Pre FEV6/FVC Ratio: 99 %
RV % pred: 220 %
RV: 4.63 L
TLC % pred: 128 %
TLC: 7.46 L

## 2020-07-15 NOTE — Progress Notes (Signed)
Collin Young    [id number]    27-Apr-1951  Primary Care Physician:Copland, Collin Filler, MD Date of Appointment: 07/15/2020 Established Patient Visit  Chief complaint:   Chief Complaint  Patient presents with  . Follow-up    follow up after PFT/spiro today     HPI: Collin Young is a 69 y.o. gentleman with COPD and HFpEF 45%.  Interval Updates: Here for follow up after spirometry. Prescibred a trial of albuterol prn but hasn't used it.  Today he denies any difficulty with breathing.   I have reviewed the patient's family social and past medical history and updated as appropriate.   Past Medical History:  Diagnosis Date  . Chronic combined systolic and diastolic CHF (congestive heart failure) ([place])    followed by Collin Young  . COPD (chronic obstructive pulmonary disease) ([place])   . Coronary artery disease cardiologist-- Collin Young   per cardiac cath 01-05-2017  chronic total occlusion pLCx with collaterals, 99% severe calcified prox. to mid RCA, otherwise mild to moderate CAD (medically managed)  . Esophageal cancer, stage IIIB [facility]) oncologist-  Collin Young/  Collin Young   dx 2010  SCC Stage IIIB completed chemoradiation;  localized recurrent left piriform sinus 01/ 2015,  completed concurrent chemoradiatoin 04/ 2015  . GERD (gastroesophageal reflux disease)    09-07-2018   no issues since Gtube removed 06/ 2019  . Headache   . History of alcohol abuse    quit 2001  . History of cancer chemotherapy    2010;   2015  . History of radiation therapy    10-19-2013 to  12-05-2013 pyriform sinus 69.96 Gy/34fx;   Radiation completed 2010 for esophageal cancer  . History of seizure 2001   alcohol withdrawal  . HOH (hard of hearing)   . Hyperlipidemia   . Hypertension   . Ischemic cardiomyopathy 12/2016   01-03-2017  echo,  ef 10-15%/   echo 05-06-2017 EF improved to 45-50%  . Lower urinary tract symptoms (LUTS)   . Prostate cancer [facility]) urologist-  Collin  Collin Young/  oncologist-- Collin Young   dx 05-27-2018--- Stage T1c,  Gleason 4+3,  PSA 9.3--  scheduled for brachytherapy 09-23-2017  . Renal cyst, left   . Voice hoarseness    secondary to radiation treatment    Past Surgical History:  Procedure Laterality Date  . CARDIOVASCULAR STRESS Collin Young  10/09/09   normal nuclearr stress Collin Young, EF 57% Collin Young)  . IR GASTROSTOMY TUBE MOD SED  01/13/2017  . IR GASTROSTOMY TUBE REMOVAL  02/03/2018  . IR PATIENT EVAL TECH 0-60 MINS  03/25/2017  . IR REMOVAL TUN ACCESS W/ PORT W/O FL MOD SED  01/15/2017  . IR REPLACE G-TUBE SIMPLE WO FLUORO  01/13/2018  . LARYNGOSCOPY N/A 09/15/2013   Procedure: LARYNGOSCOPY;  Surgeon: Collin Quitter, MD;  Location: [facility];  Service: ENT;  Laterality: N/A;  direct laryngoscopy with biopsy and esophagoscopy  . RADIOACTIVE SEED IMPLANT N/A 09/23/2018   Procedure: RADIOACTIVE SEED IMPLANT/BRACHYTHERAPY IMPLANT;  Surgeon: Collin Rhodes, MD;  Location: [facility];  Service: Urology;  Laterality: N/A;  . RIGHT/LEFT HEART CATH AND CORONARY ANGIOGRAPHY N/A 01/05/2017   Procedure: Right/Left Heart Cath and Coronary Angiography;  Surgeon: Collin Blanks, MD;  Location: [place] [facility];  Service: Cardiovascular;  Laterality: N/A;  . TRANSTHORACIC ECHOCARDIOGRAM  05/06/2017   ef 45-50%,  grade 1 diastolic dysfunction/  AV severe calcified non coronary cusp  with moderate regurg. , no stenosis (valve area per echo 01-05-2017 1.08cm^2)/  mild MR, TR, and PR/  mild LAE    Family History  Problem Relation Age of Onset  . Breast cancer Mother   . Prostate cancer Neg Hx   . Colon cancer Neg Hx   . Pancreatic cancer Neg Hx     Social History   Occupational History  . Occupation: retired  Tobacco Use  . Smoking status: Former Smoker    Packs/day: 1.00    Years: 40.00    Pack years: 40.00    Types: Cigarettes    Start date: 11/08/1960    Quit date: 08/31/2009    Years since quitting: 10.8  . Smokeless tobacco: Former  Systems developer    Types: Chew    Quit date: 09/01/1999  Vaping Use  . Vaping Use: Never used  Substance and Sexual Activity  . Alcohol use: Not Currently    Alcohol/week: 0.0 standard drinks    Comment: quit in 2001  . Drug use: No    Comment: back in the day used cocaine,alcohol, marijuana  . Sexual activity: Yes    Partners: Female    Birth control/protection: None     Physical Exam: Blood pressure 118/70, pulse 83, temperature (!) 97.4 F (36.3 C), temperature source Tympanic, height 5\' 4"  (1.626 m), weight 128 lb 2 oz (58.1 kg), SpO2 96 %.  Gen:      No acute distress, hoarse voice.  Lungs:    No increased respiratory effort, symmetric chest wall excursion, clear to auscultation bilaterally, no wheezes or crackles CV:         Regular rate and rhythm; no murmurs, rubs, or gallops.  No pedal edema   Data Reviewed: Imaging: I have personally reviewed the chest xray August 2021 which shows small right pleural effusion  PFTs:  PFT Results Latest Ref Rng & Units 07/15/2020  FVC-Pre L 2.56  FVC-Predicted Pre % 85  FVC-Post L 2.81  FVC-Predicted Post % 94  Pre FEV1/FVC % % 81  Post FEV1/FCV % % 74  FEV1-Pre L 2.08  FEV1-Predicted Pre % 93  FEV1-Post L 2.09  DLCO uncorrected ml/min/mmHg 16.29  DLCO UNC% % 75  DLCO corrected ml/min/mmHg 16.29  DLCO COR %Predicted % 75  DLVA Predicted % 90  TLC L 7.46  TLC % Predicted % 128  RV % Predicted % 220   I have personally reviewed the patient's PFTs and normal spirometry   Labs:  Immunization status: Immunization History  Administered Date(s) Administered  . Influenza,inj,Quad PF,6+ Mos 05/23/2019  . Influenza-Unspecified 05/31/2017  . Tdap 12/18/2011    Assessment:  COPD Heart failure reduced ejection fraction EF 45% Ischemic cardiomyopathy Esophageal cancer status post resection and chemoradiation  Plan/Recommendations: Spirometry is normal. Today he denies any dyspnea. Suspect most of his symptoms are cardiac in  nature.  Fine to stay off spiriva and continue albuterol as needed.  I am happy to see him back as needed if symptoms change.   Return to Care: Return if symptoms worsen or fail to improve.   Collin Llamas, MD Pulmonary and [place]

## 2020-07-15 NOTE — Progress Notes (Signed)
PFT done today. 

## 2020-07-15 NOTE — Patient Instructions (Signed)
Breathing testing was normal.  Ok to take albuterol rescue inhaler as needed for breathing problems. Ok to stay off the spiriva for now.  If breathing problems get worse, come back and see me.

## 2020-07-18 ENCOUNTER — Inpatient Hospital Stay (HOSPITAL_BASED_OUTPATIENT_CLINIC_OR_DEPARTMENT_OTHER): Payer: Medicare Other | Admitting: Hematology & Oncology

## 2020-07-18 ENCOUNTER — Other Ambulatory Visit: Payer: Self-pay

## 2020-07-18 ENCOUNTER — Inpatient Hospital Stay: Payer: Medicare Other | Attending: Hematology & Oncology

## 2020-07-18 ENCOUNTER — Encounter: Payer: Self-pay | Admitting: Hematology & Oncology

## 2020-07-18 VITALS — BP 111/67 | HR 77 | Temp 97.9°F | Resp 18 | Wt 129.0 lb

## 2020-07-18 DIAGNOSIS — Z9221 Personal history of antineoplastic chemotherapy: Secondary | ICD-10-CM | POA: Diagnosis not present

## 2020-07-18 DIAGNOSIS — I255 Ischemic cardiomyopathy: Secondary | ICD-10-CM

## 2020-07-18 DIAGNOSIS — C61 Malignant neoplasm of prostate: Secondary | ICD-10-CM | POA: Insufficient documentation

## 2020-07-18 DIAGNOSIS — I739 Peripheral vascular disease, unspecified: Secondary | ICD-10-CM | POA: Insufficient documentation

## 2020-07-18 DIAGNOSIS — Z923 Personal history of irradiation: Secondary | ICD-10-CM | POA: Diagnosis not present

## 2020-07-18 DIAGNOSIS — C12 Malignant neoplasm of pyriform sinus: Secondary | ICD-10-CM

## 2020-07-18 LAB — CMP (CANCER CENTER ONLY)
ALT: 28 U/L (ref 0–44)
AST: 39 U/L (ref 15–41)
Albumin: 3.9 g/dL (ref 3.5–5.0)
Alkaline Phosphatase: 47 U/L (ref 38–126)
Anion gap: 7 (ref 5–15)
BUN: 20 mg/dL (ref 8–23)
CO2: 34 mmol/L — ABNORMAL HIGH (ref 22–32)
Calcium: 10 mg/dL (ref 8.9–10.3)
Chloride: 94 mmol/L — ABNORMAL LOW (ref 98–111)
Creatinine: 1.34 mg/dL — ABNORMAL HIGH (ref 0.61–1.24)
GFR, Estimated: 57 mL/min — ABNORMAL LOW (ref >60)
Glucose, Bld: 109 mg/dL — ABNORMAL HIGH (ref 70–99)
Potassium: 4.3 mmol/L (ref 3.5–5.1)
Sodium: 135 mmol/L (ref 135–145)
Total Bilirubin: 0.4 mg/dL (ref 0.3–1.2)
Total Protein: 7.8 g/dL (ref 6.5–8.1)

## 2020-07-18 LAB — CBC WITH DIFFERENTIAL (CANCER CENTER ONLY)
Abs Immature Granulocytes: 0.01 10*3/uL (ref 0.00–0.07)
Basophils Absolute: 0 10*3/uL (ref 0.0–0.1)
Basophils Relative: 1 %
Eosinophils Absolute: 0.1 10*3/uL (ref 0.0–0.5)
Eosinophils Relative: 3 %
HCT: 45 % (ref 39.0–52.0)
Hemoglobin: 14.4 g/dL (ref 13.0–17.0)
Immature Granulocytes: 0 %
Lymphocytes Relative: 22 %
Lymphs Abs: 0.9 10*3/uL (ref 0.7–4.0)
MCH: 28.1 pg (ref 26.0–34.0)
MCHC: 32 g/dL (ref 30.0–36.0)
MCV: 87.7 fL (ref 80.0–100.0)
Monocytes Absolute: 0.8 10*3/uL (ref 0.1–1.0)
Monocytes Relative: 20 %
Neutro Abs: 2.1 10*3/uL (ref 1.7–7.7)
Neutrophils Relative %: 54 %
Platelet Count: 208 10*3/uL (ref 150–400)
RBC: 5.13 MIL/uL (ref 4.22–5.81)
RDW: 14.6 % (ref 11.5–15.5)
WBC Count: 3.9 10*3/uL — ABNORMAL LOW (ref 4.0–10.5)
nRBC: 0 % (ref 0.0–0.2)

## 2020-07-18 LAB — LACTATE DEHYDROGENASE: LDH: 200 U/L — ABNORMAL HIGH (ref 98–192)

## 2020-07-18 NOTE — Progress Notes (Signed)
Hematology and Oncology Follow Up Visit  Collin Young [id number] November 15, 1950 69 y.o. 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  Young is back for followup.  We see him every 6 months.  So far, he is doing pretty well.  I think that he had an evaluation by vascular surgery for his legs.  He had arterial studies.  He does have on the right side some moderate peripheral vascular disease.  On the left side also there is seems to be some moderate peripheral vascular disease.  I see that he saw Dr. Donzetta Matters of vascular surgery.  It sounds like they will pursue a conservative approach for right now.  He says that when he walks, he gets a little bit of pain initially but then the pain seems to go away.  He seems to be eating pretty well.  His weight is stable.  He has had no problems with nausea or vomiting.  He has had no dysphagia or odynophagia.  He is going to the bathroom okay.  He  did undergo prostate brachytherapy for prostate cancer.    He does see quite a few doctors.  He sees cardiology.  He sees gastroenterology.  Currently, I would say his performance status is ECOG 1.      Medications:  Current Outpatient Medications:  .  albuterol (VENTOLIN HFA) 108 (90 Base) MCG/ACT inhaler, Inhale 2 puffs into the lungs every 6 (six) hours as needed., Disp: 18 g, Rfl: 5 .  aspirin EC 81 MG tablet, Take 81 mg by mouth daily., Disp: , Rfl:  .  atorvastatin (LIPITOR) 80 MG tablet, Take 1 tablet (80 mg total) by mouth daily. To lower cholesterol, Disp: 90 tablet, Rfl: 2 .  empagliflozin (JARDIANCE) 10 MG TABS tablet, Take 1 tablet (10 mg total) by mouth daily before breakfast., Disp: 30 tablet, Rfl: 5 .  fentaNYL (DURAGESIC) 12  MCG/HR, Place 1 patch onto the skin every 3 (three) days., Disp: 10 patch, Rfl: 0 .  furosemide (LASIX) 40 MG tablet, Take 1 tablet (40 mg total) by mouth daily., Disp: 30 tablet, Rfl: 2 .  metoprolol succinate (TOPROL-XL) 25 MG 24 hr tablet, TAKE 1/2 TABLET BY MOUTH DAILY. FOLLOW-UP APPOINTMENT*, Disp: 90 tablet, Rfl: 3 .  Multiple Vitamin (MULTIVITAMIN) tablet, Take 1 tablet by mouth daily., Disp: , Rfl:  .  sacubitril-valsartan (ENTRESTO) 24-26 MG, Take 1 tablet by mouth 2 (two) times daily., Disp: 180 tablet, Rfl: 3 .  tiotropium (SPIRIVA HANDIHALER) 18 MCG inhalation capsule, Place 1 capsule (18 mcg total) into inhaler and inhale daily., Disp: 30 capsule, Rfl: 12 No current facility-administered medications for this visit.  Facility-Administered Medications Ordered in Other Visits:  .  topical emolient (BIAFINE) emulsion, , Topical, Daily, Kyung Rudd, MD, Given at 01/19/14 9509  Allergies: No Known Allergies  Past Medical History, Surgical history, Social history, and Family History were reviewed and updated.  Review of Systems: Review of Systems  Constitutional: Negative.   HENT: Negative.   Eyes: Negative.   Respiratory: Negative.   Cardiovascular: Negative.   Gastrointestinal: Negative.   Genitourinary: Negative.   Musculoskeletal: Negative.   Skin: Negative.   Neurological: Negative.   Endo/Heme/Allergies: Negative.   Psychiatric/Behavioral: Negative.  Physical Exam:  weight is 129 lb (58.5 kg). His oral temperature is 97.9 F (36.6 C). His blood pressure is 111/67 and his pulse is 77. His respiration is 18 and oxygen saturation is 98%.   Physical Exam Vitals reviewed.  HENT:     Head: Normocephalic and atraumatic.  Eyes:     Pupils: Pupils are equal, round, and reactive to light.  Cardiovascular:     Rate and Rhythm: Normal rate and regular rhythm.     Heart sounds: Normal heart sounds.  Pulmonary:     Effort: Pulmonary effort is normal.     Breath  sounds: Normal breath sounds.  Abdominal:     General: Bowel sounds are normal.     Palpations: Abdomen is soft.     Comments: Abdominal exam shows a soft abdomen.  Bowel sounds are present.  He has the healed G-tube opening.  There is no exudate.  He has no fluid wave.  There is no palpable liver or spleen tip.  Musculoskeletal:        General: No tenderness or deformity. Normal range of motion.     Cervical back: Normal range of motion.  Lymphadenopathy:     Cervical: No cervical adenopathy.  Skin:    General: Skin is warm and dry.     Findings: No erythema or rash.  Neurological:     Mental Status: He is alert and oriented to person, place, and time.  Psychiatric:        Behavior: Behavior normal.        Thought Content: Thought content normal.        Judgment: Judgment normal.     Lab Results  Component Value Date   WBC 3.9 (L) 07/18/2020   HGB 14.4 07/18/2020   HCT 45.0 07/18/2020   MCV 87.7 07/18/2020   PLT 208 07/18/2020     Chemistry      Component Value Date/Time   NA 135 07/18/2020 1505   NA 135 05/29/2020 1556   NA 139 07/30/2017 0926   NA 137 03/10/2017 1000   K 4.3 07/18/2020 1505   K 3.9 07/30/2017 0926   K 4.3 03/10/2017 1000   CL 94 (L) 07/18/2020 1505   CL 96 (L) 07/30/2017 0926   CO2 34 (H) 07/18/2020 1505   CO2 33 07/30/2017 0926   CO2 29 03/10/2017 1000   BUN 20 07/18/2020 1505   BUN 23 05/29/2020 1556   BUN 17 07/30/2017 0926   BUN 14.7 03/10/2017 1000   CREATININE 1.34 (H) 07/18/2020 1505   CREATININE 1.4 (H) 07/30/2017 0926   CREATININE 1.1 03/10/2017 1000      Component Value Date/Time   CALCIUM 10.0 07/18/2020 1505   CALCIUM 9.6 07/30/2017 0926   CALCIUM 10.0 03/10/2017 1000   ALKPHOS 47 07/18/2020 1505   ALKPHOS 49 07/30/2017 0926   ALKPHOS 51 03/10/2017 1000   AST 39 07/18/2020 1505   AST 23 03/10/2017 1000   ALT 28 07/18/2020 1505   ALT 31 07/30/2017 0926   ALT 18 03/10/2017 1000   BILITOT 0.4 07/18/2020 1505   BILITOT 0.41  03/10/2017 1000       Impression and Plan: Collin Young is 69 year old gentleman. He had a localized recurrence of carcinoma of the left pyriform sinus. He underwent concurrent chemotherapy and radiation therapy. He finished his treatment back in April of 2015.   Overall, I would have to say that he is pretty stable.  He does have his health issues.  These seem to be holding their own right now.  I will plan to see him back in another 6 months.  I think this is reasonable.  We do his fentanyl prescriptions.  The patches do seem to help with his pain.  He has a lot of neck pain from surgery and radiation therapy.  He just has a lot of scar tissue in there.  Hopefully, he will not need any surgery for the peripheral vascular disease.  I think he sees the vascular surgeon yearly.  I am just happy that his quality of life is doing well and that he is eating without difficulty.      [name], MD 11/18/20214:19 PM

## 2020-07-19 ENCOUNTER — Telehealth: Payer: Self-pay

## 2020-07-19 LAB — IRON AND TIBC
Iron: 64 ug/dL (ref 42–163)
Saturation Ratios: 22 % (ref 20–55)
TIBC: 294 ug/dL (ref 202–409)
UIBC: 230 ug/dL (ref 117–376)

## 2020-07-19 LAB — FERRITIN: Ferritin: 146 ng/mL (ref 24–336)

## 2020-07-19 NOTE — Telephone Encounter (Signed)
S/w pts daughter, aware of f./u appts per 07/18/20 los  [facility]

## 2020-08-12 ENCOUNTER — Encounter: Payer: Self-pay | Admitting: Cardiovascular Disease

## 2020-08-15 ENCOUNTER — Other Ambulatory Visit: Payer: Self-pay | Admitting: *Deleted

## 2020-08-15 DIAGNOSIS — C12 Malignant neoplasm of pyriform sinus: Secondary | ICD-10-CM

## 2020-08-15 DIAGNOSIS — C61 Malignant neoplasm of prostate: Secondary | ICD-10-CM

## 2020-08-15 MED ORDER — FENTANYL 12 MCG/HR TD PT72: 1.0000 | MEDICATED_PATCH | TRANSDERMAL | 0 refills | Status: DC

## 2020-08-16 ENCOUNTER — Other Ambulatory Visit: Payer: Self-pay | Admitting: Cardiovascular Disease

## 2020-08-16 NOTE — Progress Notes (Addendum)
[facility] [place], [place],  [address] [phone]  Date:  08/19/2020   Name:  Collin Young   DOB:  [birth year]   MRN:  [id number]  PCP:  Darreld Mclean, MD    Chief Complaint: Coronary Artery Disease (Follow up/)   History of Present Illness:  Collin Young is a 69 y.o. very pleasant male patient who presents with the following:  Patient seen today for periodic follow-up visit We last saw each other in September to establish care He has history of esophageal cancer, piriform sinus cancer status post chemo and radiation 2015 as well as feeding tube since reversed, prostate cancer, CHF/CAD  He is using Duragesic for chronic pain-pain is mostly in his head and neck  He is seen by vascular surgery, Dr. Donzetta Matters for history of PAD.  For the time being this is managed conservatively Gastroenterologist is Dr. Oretha Caprice His primary cardiologist is Dr. Skeet Latch  He is divorced, has 2 grown daughters, several children and great-grandchildren He is retired from his work in the [employer] visit with his oncologist, Dr. Marin Olp was in [place] is doing every 6 month follow-up visits He is found to be stable for recurrent carcinoma of left piriform sinus He is maintained on Duragesic patches for pain control- these do work well for him He is feels like he is breathing well and has not needed his inhalers recently  He is eating well No difficulty swallowing   He was seen by pulmonology last month as well Assessment: COPD Heart failure reduced ejection fraction EF 45% Ischemic cardiomyopathy Esophagealcancer status postresection andchemoradiation Plan/Recommendations: Spirometry is normal. Today he denies any dyspnea. Suspect most of his symptoms are cardiac in nature.  Fine to stay off spiriva and continue albuterol as needed.  I am happy to see him back as needed if  symptoms change  Cardiology visit in September  ASSESSMENT AND PLAN: # Chronic systolic and diastolic heart failure:  # Moderate MR: # Moderate AR:  LVEF significantly improved from 10-15% to 45-50%.  However at his last appointment he had worse heart failure symptoms and was referred for an echo that showed his EF has reduced to 35 to 40%.  He is doing much better symptomatically on Entresto.  There is any room to adjust his Entresto further.  Continue metoprolol and Lasix.  We will check a basic metabolic panel today.  He does appear to be euvolemic.  Given that he does not have any ischemic symptoms we will not refer him for cardiac catheterization again at this time. We will start Jardiance. Check U/A today given his report of dysuria.  He was advised that there is a risk of urinary tract and mycotic infections. # Obstructive CAD:  # Hyperlipidemia:  Medically managed due to acute illness at the time of diagnosis.  100% LCx and 99% RCA.  The RCA would require hemodynamic support.  He has no chest pain so we will continue medical management for now. Continue aspirin and atorvastatin.  He has no chest pain but has complex coronary disease.  He is still quite frail and would be high risk for surgery.  Would only consider for refractory symptoms of ischemia or heart failure.    Hepatitis C screening- not done yet, will check today COVID-19 vaccine- his first dose was given (I wonder if this is actually his second dose) Pneumonia vaccine-would like to  have today Flu vaccine is done  Pt notes that he lives in a retirement center and notes that they will give him his covid booster there  Pt notes his weight at home was 127 this am- he is wearing heavy boots and clothing currently   We note that his blood pressure is on the low side.  He does admit to feeling dizzy/lightheaded, especially if he stands up from a seated No lower extremity edema is noted  Wt Readings from Last 3 Encounters:   08/19/20 131 lb 3.2 oz (59.5 kg)  07/18/20 129 lb (58.5 kg)  07/15/20 128 lb 2 oz (58.1 kg)    BP Readings from Last 3 Encounters:  07/18/20 111/67  07/15/20 118/70  06/07/20 120/70    Patient Active Problem List   Diagnosis Date Noted  . Claudication in peripheral vascular disease ([place]) 01/25/2020  . Late latent syphilis 01/15/2020  . CAD (coronary artery disease) 07/05/2019  . CRI (chronic renal insufficiency), stage 3 (moderate) ([place]) 07/05/2019  . Combined systolic and diastolic heart failure ([place]) 09/26/2018  . Malignant neoplasm of prostate ([place]) 06/17/2018  . Angina decubitus ([place])   . Medication management   . Dysphagia   . Palliative care by specialist   . Ischemic cardiomyopathy   . Congestive dilated cardiomyopathy ([place])   . Malignant neoplasm of pyriform sinus ([place]) 09/28/2013  . 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 NONSPECIFIC ABN FINDING RAD & OTH EXAM GI TRACT 05/14/2009    Past Medical History:  Diagnosis Date  . Chronic combined systolic and diastolic CHF (congestive heart failure) ([place])    followed by [name]  . COPD (chronic obstructive pulmonary disease) ([place])   . Coronary artery disease cardiologist-- [name]   per cardiac cath 01-05-2017  chronic total occlusion pLCx with collaterals, 99% severe calcified prox. to mid RCA, otherwise mild to moderate CAD (medically managed)  . Esophageal cancer, stage IIIB [facility]) oncologist-  dr ennever/  dr moody   dx 2010  SCC Stage IIIB completed chemoradiation;  localized recurrent left piriform sinus 01/ 2015,  completed concurrent chemoradiatoin 04/ 2015  . GERD (gastroesophageal reflux disease)    09-07-2018   no issues since Gtube removed 06/ 2019  . Headache   . History of alcohol abuse    quit 2001  . History of cancer chemotherapy    2010;   2015  . History of radiation therapy    10-19-2013 to  12-05-2013 pyriform sinus 69.96 Gy/57fx;   Radiation completed 2010 for esophageal cancer  .  History of seizure 2001   alcohol withdrawal  . HOH (hard of hearing)   . Hyperlipidemia   . Hypertension   . Ischemic cardiomyopathy 12/2016   01-03-2017  echo,  ef 10-15%/   echo 05-06-2017 EF improved to 45-50%  . Lower urinary tract symptoms (LUTS)   . Prostate cancer [facility]) urologist-  dr ottelin/  oncologist-- [name]   dx 05-27-2018--- Stage T1c,  Gleason 4+3,  PSA 9.3--  scheduled for brachytherapy 09-23-2017  . Renal cyst, left   . Voice hoarseness    secondary to radiation treatment    Past Surgical History:  Procedure Laterality Date  . CARDIOVASCULAR STRESS TEST  10/09/09   normal nuclearr stress test, EF 57% [name])  . IR GASTROSTOMY TUBE MOD SED  01/13/2017  . IR GASTROSTOMY TUBE REMOVAL  02/03/2018  . IR PATIENT EVAL TECH 0-60 MINS  03/25/2017  . IR REMOVAL TUN ACCESS W/ PORT  W/O FL MOD SED  01/15/2017  . IR REPLACE G-TUBE SIMPLE WO FLUORO  01/13/2018  . LARYNGOSCOPY N/A 09/15/2013   Procedure: LARYNGOSCOPY;  Surgeon: [name], MD;  Location: [facility];  Service: ENT;  Laterality: N/A;  direct laryngoscopy with biopsy and esophagoscopy  . RADIOACTIVE SEED IMPLANT N/A 09/23/2018   Procedure: RADIOACTIVE SEED IMPLANT/BRACHYTHERAPY IMPLANT;  Surgeon: [name], MD;  Location: [facility];  Service: Urology;  Laterality: N/A;  . RIGHT/LEFT HEART CATH AND CORONARY ANGIOGRAPHY N/A 01/05/2017   Procedure: Right/Left Heart Cath and Coronary Angiography;  Surgeon: [name], MD;  Location: [facility];  Service: Cardiovascular;  Laterality: N/A;  . TRANSTHORACIC ECHOCARDIOGRAM  05/06/2017   ef 45-50%,  grade 1 diastolic dysfunction/  AV severe calcified non coronary cusp with moderate regurg. , no stenosis (valve area per echo 01-05-2017 1.08cm^2)/  mild MR, TR, and PR/  mild LAE    Social History   Tobacco Use  . Smoking status: Former Smoker    Packs/day: 1.00    Years: 40.00    Pack years: 40.00    Types: Cigarettes    Start date:  11/08/1960    Quit date: 08/31/2009    Years since quitting: 10.9  . Smokeless tobacco: Former Systems developer    Types: Chew    Quit date: 09/01/1999  Vaping Use  . Vaping Use: Never used  Substance Use Topics  . Alcohol use: Not Currently    Alcohol/week: 0.0 standard drinks    Comment: quit in 2001  . Drug use: No    Comment: back in the day used cocaine,alcohol, marijuana    Family History  Problem Relation Age of Onset  . Breast cancer Mother   . Prostate cancer Neg Hx   . Colon cancer Neg Hx   . Pancreatic cancer Neg Hx     No Known Allergies  Medication list has been reviewed and updated.  Current Outpatient Medications on File Prior to Visit  Medication Sig Dispense Refill  . albuterol (VENTOLIN HFA) 108 (90 Base) MCG/ACT inhaler Inhale 2 puffs into the lungs every 6 (six) hours as needed. 18 g 5  . aspirin EC 81 MG tablet Take 81 mg by mouth daily.    Marland Kitchen atorvastatin (LIPITOR) 80 MG tablet Take 1 tablet (80 mg total) by mouth daily. To lower cholesterol 90 tablet 2  . empagliflozin (JARDIANCE) 10 MG TABS tablet Take 1 tablet (10 mg total) by mouth daily before breakfast. 30 tablet 5  . fentaNYL (DURAGESIC) 12 MCG/HR Place 1 patch onto the skin every 3 (three) days. 10 patch 0  . furosemide (LASIX) 40 MG tablet TAKE 1 TABLET(40 MG) BY MOUTH DAILY 30 tablet 2  . metoprolol succinate (TOPROL-XL) 25 MG 24 hr tablet TAKE 1/2 TABLET BY MOUTH DAILY. FOLLOW-UP APPOINTMENT* 90 tablet 3  . Multiple Vitamin (MULTIVITAMIN) tablet Take 1 tablet by mouth daily.    . sacubitril-valsartan (ENTRESTO) 24-26 MG Take 1 tablet by mouth 2 (two) times daily. 180 tablet 3  . tiotropium (SPIRIVA HANDIHALER) 18 MCG inhalation capsule Place 1 capsule (18 mcg total) into inhaler and inhale daily. 30 capsule 12   Current Facility-Administered Medications on File Prior to Visit  Medication Dose Route Frequency Provider Last Rate Last Admin  . topical emolient (BIAFINE) emulsion   Topical Daily Kyung Rudd, MD    Given at 01/19/14 0981    Review of Systems:  As per HPI- otherwise negative.   Physical Examination: Vitals:  08/19/20 1312  Resp: 18  SpO2: 98%   Vitals:   08/19/20 1334  Weight: 131 lb 3.2 oz (59.5 kg)  Height:    Body mass index is 22.52 kg/m. Ideal Body Weight: Weight in (lb) to have BMI = 25: 145.3  GEN: no acute distress. Wiry build, looks well  Voice is hoarse due to cancer treatment HEENT: Atraumatic, Normocephalic.  Ears and Nose: No external deformity. CV: RRR, No M/G/R. No JVD. No thrill. No extra heart sounds. PULM: CTA B, no wheezes, crackles, rhonchi. No retractions. No resp. distress. No accessory muscle use. ABD: S, NT, ND, +BS. No rebound. No HSM. EXTR: No c/c/e PSYCH: Normally interactive. Conversant.   Pt's weight corrected to 131 with clothes once he took off his steel toed boots.  He notes home weight this am 127- no sign of fluid retention  Standing BP did drop to 98/52- supine 100/62  Assessment and Plan: Encounter for hepatitis C screening test for low risk patient - Plan: Hepatitis C antibody  Screening for prostate cancer - Plan: PSA  Immunization due - Plan: Pneumococcal conjugate vaccine 13-valent IM  Hypotension due to drugs  Patient today for follow-up visit hepatitis C screening today He has history of prostate cancer on chart, no recent PSA, will check them today Went over immunizations, he plans to get Covid booster at his assisted living facility.  Gave Prevnar today Blood pressure is a bit low, suspect he may be slightly overmedicated at this time.  We will plan to decrease his furosemide from 40 to 20 mg daily-I will check with his cardiologist to make sure this is acceptable Will plan further follow- up pending labs. Plan to visit in 6 months assuming all is well   This visit occurred during the SARS-CoV-2 public health emergency.  Safety protocols were in place, including screening questions prior to the visit, additional  usage of staff PPE, and extensive cleaning of exam room while observing appropriate contact time as indicated for disinfecting solutions.    Signed Lamar Blinks, MD   called 1/3- spoke with his daughter [name] (on hippa form) - pt does have hep C.  Will refer to hepatology for treatment.  Asked her to let me know if we don't hear from them in a couple of weeks [name] reports she does phone messages for her dad as he is [facility]  Results for orders placed or performed in visit on 08/19/20  Hepatitis C antibody  Result Value Ref Range   Hepatitis C Ab REACTIVE (A) NON-REACTI   SIGNAL TO CUT-OFF 36.40 (H) <1.00  PSA  Result Value Ref Range   PSA 0.64 0.10 - 4.00 ng/mL  HCV RNA, Quantitative Real Time PCR  Result Value Ref Range   HCV RNA, PCR, QN 14,800 (H) NOT DETECT IU/mL   HCV Quantitative Log 4.17 (H) NOT DETECT Log IU/mL   Lab Results  Component Value Date   PSA1 3.5 01/08/2017   PSA 0.64 08/19/2020    Letter to pt also

## 2020-08-16 NOTE — Patient Instructions (Addendum)
It was very nice to see you again today, happy holidays.  Please see me in about 6 months assuming all is well I will be in touch with your labs asap  You got a pneumonia vaccine today- 2nd dose due next year Please get your covid booster as planned Please decrease your furosemide (lasix- fluid pill) to a 1/2 tablet as your blood pressure is low.  I will make sure Dr Oval Linsey is ok with this idea and let you know if she wants you to continue 40 mg! Take care

## 2020-08-19 ENCOUNTER — Encounter: Payer: Self-pay | Admitting: Family Medicine

## 2020-08-19 ENCOUNTER — Other Ambulatory Visit: Payer: Self-pay

## 2020-08-19 ENCOUNTER — Ambulatory Visit (INDEPENDENT_AMBULATORY_CARE_PROVIDER_SITE_OTHER): Payer: Medicare Other | Admitting: Family Medicine

## 2020-08-19 VITALS — Resp 18 | Ht 64.0 in | Wt 131.2 lb

## 2020-08-19 DIAGNOSIS — Z23 Encounter for immunization: Secondary | ICD-10-CM

## 2020-08-19 DIAGNOSIS — K732 Chronic active hepatitis, not elsewhere classified: Secondary | ICD-10-CM | POA: Diagnosis not present

## 2020-08-19 DIAGNOSIS — Z1159 Encounter for screening for other viral diseases: Secondary | ICD-10-CM | POA: Diagnosis not present

## 2020-08-19 DIAGNOSIS — Z125 Encounter for screening for malignant neoplasm of prostate: Secondary | ICD-10-CM | POA: Diagnosis not present

## 2020-08-19 DIAGNOSIS — I952 Hypotension due to drugs: Secondary | ICD-10-CM | POA: Diagnosis not present

## 2020-08-20 ENCOUNTER — Encounter: Payer: Self-pay | Admitting: Family Medicine

## 2020-08-20 LAB — PSA: PSA: 0.64 ng/mL (ref 0.10–4.00)

## 2020-09-02 LAB — HCV RNA,QUANTITATIVE REAL TIME PCR
HCV Quantitative Log: 4.17 Log IU/mL — ABNORMAL HIGH
HCV RNA, PCR, QN: 14800 IU/mL — ABNORMAL HIGH

## 2020-09-02 LAB — HEPATITIS C ANTIBODY
Hepatitis C Ab: REACTIVE — AB
SIGNAL TO CUT-OFF: 36.4 — ABNORMAL HIGH (ref <1.00)

## 2020-09-02 NOTE — Addendum Note (Signed)
Addended by: Abbe Amsterdam C on: 09/02/2020 09:54 AM   Modules accepted: Orders

## 2020-09-05 ENCOUNTER — Ambulatory Visit: Payer: Medicare Other | Admitting: Cardiovascular Disease

## 2020-09-23 ENCOUNTER — Ambulatory Visit (INDEPENDENT_AMBULATORY_CARE_PROVIDER_SITE_OTHER): Payer: Medicare Other | Admitting: Cardiovascular Disease

## 2020-09-23 ENCOUNTER — Encounter: Payer: Self-pay | Admitting: Cardiovascular Disease

## 2020-09-23 ENCOUNTER — Other Ambulatory Visit: Payer: Self-pay

## 2020-09-23 VITALS — BP 102/60 | HR 86 | Ht 66.0 in | Wt 136.2 lb

## 2020-09-23 DIAGNOSIS — I5043 Acute on chronic combined systolic (congestive) and diastolic (congestive) heart failure: Secondary | ICD-10-CM | POA: Diagnosis not present

## 2020-09-23 DIAGNOSIS — Z5181 Encounter for therapeutic drug level monitoring: Secondary | ICD-10-CM | POA: Diagnosis not present

## 2020-09-23 DIAGNOSIS — N183 Chronic kidney disease, stage 3 unspecified: Secondary | ICD-10-CM

## 2020-09-23 DIAGNOSIS — I739 Peripheral vascular disease, unspecified: Secondary | ICD-10-CM

## 2020-09-23 DIAGNOSIS — I255 Ischemic cardiomyopathy: Secondary | ICD-10-CM

## 2020-09-23 DIAGNOSIS — E7800 Pure hypercholesterolemia, unspecified: Secondary | ICD-10-CM | POA: Diagnosis not present

## 2020-09-23 DIAGNOSIS — I2510 Atherosclerotic heart disease of native coronary artery without angina pectoris: Secondary | ICD-10-CM

## 2020-09-23 HISTORY — DX: Pure hypercholesterolemia, unspecified: E78.00

## 2020-09-23 NOTE — Progress Notes (Signed)
Cardiology Office Note   Date:  09/23/2020   ID:  Collin Young, DOB Mar 07, 1951, MRN [id number]  PCP:  Collin Mclean, MD  Cardiologist:   Collin Latch, MD   No chief complaint on file.   History of Present Illness: Collin Young is a 70 y.o. 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  Collin Young was admitted 12/2016 with progressive shortness of breath.  BNP was >2000.  Echo revealed LVEF 10-15% with diffuse hypokinesis, moderate MR and moderate AR.  PASP was 49 mmHg.  He underwent left heart catheterization and was found to have 100% proximal LCx with L-->L collaterals, 99% prox-mid RCA and otherwise mild-moderate CAD.  There was consideration of RCA PCI.  However, this would have required hemodynamic support and was deferred given his frailty and comorbidities.  He was treated with milrinone and IV lasix.  Milrinone was weaned prior to discharge.  During that hospitalization he was also treated for MSSA bacteremia.  He was unable to get a TEE due to his prior cancer.  His Port was removed and a PICC line was placed.   He had a  PEG tube placed due to frank aspiration.  At his last appointment Collin Young was referred for an echo 05/2017 that showed a significant improvement in his LVEF to 45-50% and grade 1 diastolic dysfunction.  There was moderate aortic regurgitation.  He underwent radiation of the prostate.  Collin Young was seen 05/2020 and reported heart failure symptoms.  He had no edema but reported orthopnea, profound shortness of breath, and PND.  At that time he was started on Entresto.  BNP at that appointment was markedly elevated to 3671.  He was called after the visit and was feeling much better.  He was referred for an echo that revealed LVEF 35 to 40% with grade 1 diastolic dysfunction.   There is severe hypokinesis of the basal to mid inferoseptal, inferior, and inferolateral myocardium.  Since that time he has been feeling well.  He was struggling with some dizziness and low blood pressure.  He saw Dr. Lorelei Pont on 07/2020 and furosemide was reduced to 20 mg.  Since making that change he has not had any more dizziness.  He denies any lower extremity edema, orthopnea, or PND.  He is not getting formal exercise but does stay active.  He has no exertional symptoms.  He denies any falls.   Past Medical History:  Diagnosis Date  . Chronic combined systolic and diastolic CHF (congestive heart failure) ([place])    followed by dr Collin Young Collin Young  . COPD (chronic obstructive pulmonary disease) ([place])   . Coronary artery disease cardiologist-- dr Collin Young   per cardiac cath 01-05-2017  chronic total occlusion pLCx with collaterals, 99% severe calcified prox. to mid RCA, otherwise mild to moderate CAD (medically managed)  . Esophageal cancer, stage IIIB [facility]) oncologist-  dr ennever/  dr moody   dx 2010  SCC Stage IIIB completed chemoradiation;  localized recurrent left piriform sinus 01/ 2015,  completed concurrent chemoradiatoin 04/ 2015  . GERD (gastroesophageal reflux disease)    09-07-2018   no issues since Gtube removed 06/ 2019  . Headache   . History of alcohol abuse    quit 2001  . History of cancer chemotherapy    2010;  2015  . History of radiation therapy    10-19-2013 to  12-05-2013 pyriform sinus 69.96 Gy/26fx;   Radiation completed 2010 for esophageal cancer  . History of seizure 2001   alcohol withdrawal  . HOH (hard of hearing)   . Hyperlipidemia   . Hypertension   . Ischemic cardiomyopathy 12/2016   01-03-2017  echo,  ef 10-15%/   echo 05-06-2017 EF improved to 45-50%  . Lower urinary tract symptoms (LUTS)   . Prostate cancer [facility]) urologist-  dr ottelin/  oncologist-- [name]   dx 05-27-2018--- Stage T1c,  Gleason 4+3,  PSA 9.3--  scheduled for brachytherapy  09-23-2017  . Pure hypercholesterolemia 09/23/2020  . Renal cyst, left   . Voice hoarseness    secondary to radiation treatment    Past Surgical History:  Procedure Laterality Date  . CARDIOVASCULAR STRESS TEST  10/09/09   normal nuclearr stress test, EF 57% Collin Young)  . IR GASTROSTOMY TUBE MOD SED  01/13/2017  . IR GASTROSTOMY TUBE REMOVAL  02/03/2018  . IR PATIENT EVAL TECH 0-60 MINS  03/25/2017  . IR REMOVAL TUN ACCESS W/ PORT W/O FL MOD SED  01/15/2017  . IR REPLACE G-TUBE SIMPLE WO FLUORO  01/13/2018  . LARYNGOSCOPY N/A 09/15/2013   Procedure: LARYNGOSCOPY;  Surgeon: [name], MD;  Location: [facility];  Service: ENT;  Laterality: N/A;  direct laryngoscopy with biopsy and esophagoscopy  . RADIOACTIVE SEED IMPLANT N/A 09/23/2018   Procedure: RADIOACTIVE SEED IMPLANT/BRACHYTHERAPY IMPLANT;  Surgeon: [name], MD;  Location: [facility];  Service: Urology;  Laterality: N/A;  . RIGHT/LEFT HEART CATH AND CORONARY ANGIOGRAPHY N/A 01/05/2017   Procedure: Right/Left Heart Cath and Coronary Angiography;  Surgeon: [name], MD;  Location: [facility];  Service: Cardiovascular;  Laterality: N/A;  . TRANSTHORACIC ECHOCARDIOGRAM  05/06/2017   ef 45-50%,  grade 1 diastolic dysfunction/  AV severe calcified non coronary cusp with moderate regurg. , no stenosis (valve area per echo 01-05-2017 1.08cm^2)/  mild MR, TR, and PR/  mild LAE     Current Outpatient Medications  Medication Sig Dispense Refill  . albuterol (VENTOLIN HFA) 108 (90 Base) MCG/ACT inhaler Inhale 2 puffs into the lungs every 6 (six) hours as needed. 18 g 5  . aspirin EC 81 MG tablet Take 81 mg by mouth daily.    Marland Kitchen atorvastatin (LIPITOR) 80 MG tablet Take 1 tablet (80 mg total) by mouth daily. To lower cholesterol 90 tablet 2  . fentaNYL (DURAGESIC) 12 MCG/HR Place 1 patch onto the skin every 3 (three) days. 10 patch 0  . furosemide (LASIX) 20 MG tablet Take 20 mg by mouth daily.    . melatonin 5  MG TABS Take 5 mg by mouth at bedtime as needed.    . metoprolol succinate (TOPROL-XL) 25 MG 24 hr tablet TAKE 1/2 TABLET BY MOUTH DAILY. FOLLOW-UP APPOINTMENT* 90 tablet 3  . Multiple Vitamin (MULTIVITAMIN) tablet Take 1 tablet by mouth daily.    . sacubitril-valsartan (ENTRESTO) 24-26 MG Take 1 tablet by mouth 2 (two) times daily. 180 tablet 3  . tiotropium (SPIRIVA HANDIHALER) 18 MCG inhalation capsule Place 1 capsule (18 mcg total) into inhaler and inhale daily. 30 capsule 12   No current facility-administered medications for this visit.   Facility-Administered Medications Ordered in Other Visits  Medication Dose Route Frequency Provider Last Rate Last Admin  . topical emolient (BIAFINE) emulsion   Topical Daily Kyung Rudd, MD   Given at 01/19/14  0912    Allergies:   Patient has no known allergies.    Social History:  The patient  reports that he quit smoking about 11 years ago. His smoking use included cigarettes. He started smoking about 59 years ago. He has a 40.00 pack-year smoking history. He quit smokeless tobacco use about 21 years ago.  His smokeless tobacco use included chew. He reports previous alcohol use. He reports that he does not use drugs.   Family History:  The patient's family history includes Breast cancer in his mother.    ROS:  Please see the history of present illness.   Otherwise, review of systems are positive for none.   All other systems are reviewed and negative.    PHYSICAL EXAM: VS:  BP 102/60   Pulse 86   Ht 5\' 6"  (1.676 m)   Wt 136 lb 3.2 oz (61.8 kg)   BMI 21.98 kg/m  , BMI Body mass index is 21.98 kg/m. GENERAL:  Well appearing HEENT: Pupils equal round and reactive, fundi not visualized, oral mucosa unremarkable NECK:  No jugular venous distention, waveform within normal limits, carotid upstroke brisk and symmetric, no bruits LUNGS:  Clear to auscultation bilaterally HEART:  RRR.  PMI not displaced or sustained,S1 and S2 within normal limits,  no S3, no S4, no clicks, no rubs, no murmurs ABD:  Flat, positive bowel sounds normal in frequency in pitch, no bruits, no rebound, no guarding, no midline pulsatile mass, no hepatomegaly, no splenomegaly EXT:  2 plus pulses throughout, no edema, no cyanosis no clubbing SKIN:  No rashes no nodules NEURO:  Cranial nerves II through XII grossly intact, motor grossly intact throughout PSYCH:  Cognitively intact, oriented to person place and time   EKG:  EKG is not ordered today. 07/30/17: Sinus rhythm.  Rate 83 bpm.  Lateral TWI.  05/17/20: Sinus rhythm.  Rate 92 bpm.  Poor R wave progression.  LVH.  Nonspecific T wave abnormalities.  Echo 05/29/20: 1. Left ventricular ejection fraction, by estimation, is 35 to 40%. The  left ventricle has moderately decreased function. The left ventricle  demonstrates regional wall motion abnormalities (see scoring  diagram/findings for description). Left ventricular  diastolic parameters are consistent with Grade I diastolic dysfunction  (impaired relaxation). There is severe hypokinesis of the left  ventricular, basal-mid inferoseptal wall, inferior wall and inferolateral  wall.  2. Right ventricular systolic function is normal. The right ventricular  size is normal. There is mildly elevated pulmonary artery systolic  pressure.  3. Left atrial size was mildly dilated.  4. The mitral valve is normal in structure. Trivial mitral valve  regurgitation.  5. The aortic valve is tricuspid. There is mild calcification of the  aortic valve. There is mild thickening of the aortic valve. Aortic valve  regurgitation is mild to moderate. Mild aortic valve sclerosis is present,  with no evidence of aortic valve  stenosis.   Echo 05/06/17: Study Conclusions  - Left ventricle: Diffuse hypokinesis slightly worse in inferior   wall. The cavity size was mildly dilated. Systolic function was   mildly reduced. The estimated ejection fraction was in the range    of 45% to 50%. Doppler parameters are consistent with abnormal   left ventricular relaxation (grade 1 diastolic dysfunction). - Aortic valve: Severely calcified non coronary cusp. There was   moderate regurgitation. - Mitral valve: There was mild regurgitation. - Left atrium: The atrium was mildly dilated. - Atrial septum: No defect or patent foramen ovale was  identified.  Echo 01/03/17: Study Conclusions  - Left ventricle: The cavity size was normal. Wall thickness was   normal. The estimated ejection fraction was in the range of 10%   to 15%. Diffuse hypokinesis. DIastolic function is abnormal,   indeterminant grade. - Aortic valve: There was moderate regurgitation. Valve area (VTI):   1.08 cm^2. Valve area (Vmax): 1.5 cm^2. Valve area (Vmean): 1.5   cm^2. - Mitral valve: There was moderate regurgitation. - Left atrium: The atrium was mildly dilated. - Right ventricle: Systolic function was mildly to moderately   reduced. - Tricuspid valve: There was moderate regurgitation. - Pulmonary arteries: Systolic pressure was moderately increased.   PA peak pressure: 49 mm Hg (S).  LHC 01/05/17:  Prox RCA to Mid RCA lesion, 99 %stenosed.  Mid RCA lesion, 30 %stenosed.  Dist RCA lesion, 40 %stenosed.  RPDA lesion, 20 %stenosed.  Prox Cx lesion, 100 %stenosed.  Ost Ramus to Ramus lesion, 50 %stenosed.  Ost 3rd Diag to 3rd Diag lesion, 30 %stenosed.  Mid LAD lesion, 40 %stenosed.  Ost LAD to Prox LAD lesion, 20 %stenosed.  Ost LM to LM lesion, 40 %stenosed.  Hemodynamic findings consistent with mild pulmonary hypertension.   1. Severe calcific disease in the proximal to mid RCA 2. 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 Moderate left main and mid LAD stenosis 4. Elevated filling pressures   Recent Labs: 05/17/2020: BNP 3,671.5 07/18/2020: ALT 28; BUN 20; Creatinine 1.34; Hemoglobin 14.4; Platelet Count 208; Potassium 4.3; Sodium 135    Lipid Panel     Component Value Date/Time   CHOL 230 (H) 01/03/2020 1502   TRIG 64 01/03/2020 1502   HDL 72 01/03/2020 1502   CHOLHDL 3.2 01/03/2020 1502   LDLCALC 147 (H) 01/03/2020 1502      Wt Readings from Last 3 Encounters:  09/23/20 136 lb 3.2 oz (61.8 kg)  08/19/20 131 lb 3.2 oz (59.5 kg)  07/18/20 129 lb (58.5 kg)     ASSESSMENT AND PLAN:  # Chronic systolic and diastolic heart failure:  # Moderate MR: # Moderate AR:  LVEF significantly improved from 10-15% to 45-50%.  However at his last appointment he had worse heart failure symptoms and was referred for an echo that showed his EF has reduced to 35 to 40%.  He is doing much better symptomatically on Entresto.  Furosemide was reduced due to dizziness.  He is doing much better.  Recommended starting Jardiance at his last appointment.  Given that his blood pressure is low and that this can cause orthostatic hypotension, we will defer that for now.  # Obstructive CAD:  # Hyperlipidemia:  Medically managed due to acute illness and frailty at the time of diagnosis.  100% LCx and 99% RCA.  The RCA would require hemodynamic support.  He has no chest pain so we will continue medical management for now. Continue aspirin and atorvastatin.  He has no chest pain but has complex coronary disease.  He is still quite frail and would be high risk for surgery.  Would only consider for refractory symptoms of ischemia or heart failure.  LDL was 147 when checked 12/2019.  Repeat lipids.  Continue atorvastatin.   Current medicines are reviewed at length with the patient today.  The patient does not have concerns regarding medicines.  The following changes have been made:   Labs/ tests ordered today include:  Orders Placed This Encounter  Procedures  . Lipid panel     Disposition:  FU with Dwyane Dupree C. Young Linsey, MD, [facility] in 6 months.    Signed, Leontae Bostock C. Young Linsey, MD, [facility]  09/23/2020 12:37 PM    [facility]

## 2020-09-23 NOTE — Patient Instructions (Addendum)
Medication Instructions:  OK TO TAKE MELATONIN 5 MG AT BEDTIME AS NEEDED   *If you need a refill on your cardiac medications before your next appointment, please call your pharmacy*  Lab Work: FASTING LIPID SOON   Testing/Procedures: NONE   Follow-Up: At [facility], you and your health needs are our priority.  As part of our continuing mission to provide you with exceptional heart care, we have created designated Provider Care Teams.  These Care Teams include your primary Cardiologist (physician) and Advanced Practice Providers (APPs -  Physician Assistants and Nurse Practitioners) who all work together to provide you with the care you need, when you need it.  We recommend signing up for the patient portal called "MyChart".  Sign up information is provided on this After Visit Summary.  MyChart is used to connect with patients for Virtual Visits (Telemedicine).  Patients are able to view lab/test results, encounter notes, upcoming appointments, etc.  Non-urgent messages can be sent to your provider as well.   To learn more about what you can do with MyChart, go to [email or web address].    Your next appointment:   6 month(s)  The format for your next appointment:   In Person  Provider:   You may see Skeet Latch, MD or one of the following Advanced Practice Providers on your designated Care Team:    Kerin Ransom, PA-C  [place], [place]  Coletta Memos, [place]

## 2020-09-24 LAB — LIPID PANEL
Chol/HDL Ratio: 2.9 ratio (ref 0.0–5.0)
Cholesterol, Total: 156 mg/dL (ref 100–199)
HDL: 54 mg/dL (ref >39)
LDL Chol Calc (NIH): 89 mg/dL (ref 0–99)
Triglycerides: 63 mg/dL (ref 0–149)
VLDL Cholesterol Cal: 13 mg/dL (ref 5–40)

## 2020-09-30 ENCOUNTER — Telehealth: Payer: Self-pay | Admitting: Cardiovascular Disease

## 2020-09-30 NOTE — Telephone Encounter (Signed)
Will forward to Dr Oval Linsey so she can reach out to NP

## 2020-09-30 NOTE — Telephone Encounter (Signed)
Roosevelt Locks, NP with [facility] is calling to speak with Dr. Oval Linsey. She states that she is wanting to treat Mr. Gundlach's Hepatitis C but there are some drug interactions with his statin's. I have left her cell number in a staff message for Dr. Oval Linsey and Rip Harbour. She is aware Dr. Oval Linsey is not in the office today.

## 2020-10-03 ENCOUNTER — Other Ambulatory Visit: Payer: Self-pay | Admitting: *Deleted

## 2020-10-03 DIAGNOSIS — C12 Malignant neoplasm of pyriform sinus: Secondary | ICD-10-CM

## 2020-10-03 DIAGNOSIS — C61 Malignant neoplasm of prostate: Secondary | ICD-10-CM

## 2020-10-03 MED ORDER — FENTANYL 12 MCG/HR TD PT72: 1.0000 | MEDICATED_PATCH | TRANSDERMAL | 0 refills | Status: DC

## 2020-10-10 NOTE — Telephone Encounter (Signed)
    Collin Young is calling back to f/u, she said she is trying to treat pt's Hep C but need to change pt's statin. Advised Dr. Oval Linsey will be back on 02/16 she said she will wait for her response until 02/18

## 2020-10-14 NOTE — Telephone Encounter (Signed)
Attempted to call and left a message with my cell phone number.

## 2020-10-15 ENCOUNTER — Other Ambulatory Visit: Payer: Self-pay

## 2020-10-15 ENCOUNTER — Ambulatory Visit (INDEPENDENT_AMBULATORY_CARE_PROVIDER_SITE_OTHER): Payer: Medicare Other | Admitting: Pharmacist

## 2020-10-15 VITALS — BP 126/82 | HR 86 | Resp 16 | Ht 64.0 in | Wt 137.0 lb

## 2020-10-15 DIAGNOSIS — I2510 Atherosclerotic heart disease of native coronary artery without angina pectoris: Secondary | ICD-10-CM | POA: Diagnosis not present

## 2020-10-15 DIAGNOSIS — E7800 Pure hypercholesterolemia, unspecified: Secondary | ICD-10-CM

## 2020-10-15 MED ORDER — REPATHA SURECLICK 140 MG/ML ~~LOC~~ SOAJ: 140.0000 mg | SUBCUTANEOUS | 11 refills | Status: DC

## 2020-10-15 NOTE — Patient Instructions (Addendum)
Your Results:             Your most recent labs Goal  Total Cholesterol 156 < 200  Triglycerides 63 < 150  HDL (good cholesterol) 54 > 40  LDL (bad cholesterol) 89 < 70      Medication changes: *START Repatha 140mg  every 14 days *Continue all other medication as prescribed*  Lab orders: *Repeat fasting blood work after 4th dose of new medication (April 15 to 20th)  Patient Assistance:  The [employer] foundation offers assistance to help pay for medication copays.  They will cover copays for all cholesterol lowering meds, including statins, fibrates, omega-3 oils, ezetimibe, Repatha, Praluent, Nexletol, Nexlizet.  The cards are usually good for $2,500 or 12 months, whichever comes first. 1. Go to healthwellfoundation.org 2. Click on "Apply Now" 3. Answer questions as to whom is applying (patient or representative) 4. Your disease fund will be "hypercholesterolemia - Medicare access" 5. They will ask questions about finances and which medications you are taking for cholesterol 6. When you submit, the approval is usually within minutes.  You will need to print the card information from the site 7. You will need to show this information to your pharmacy, they will bill your Medicare Part D plan first -then bill [employer] Well --for the copay.   You can also call them at [phone], although the hold times can be quite long.   Thank you for choosing [facility]

## 2020-10-15 NOTE — Progress Notes (Signed)
Patient ID: Collin Young                 DOB: 01/30/1951                    MRN: [id number]     HPI: Collin Young is a 70 y.o. male patient referred to lipid clinic by Collin Collin Young. PMH is significant for CAD, squamous cell carcinoma, COPD, headaches, hyperlipidemia, and hypercholesterolemia. Noted Mid RCA 99% stenosed, and  prox Cx lesion 100% stenosed too frail for surgery.   Collin Young presents to the clinic accompany by his daughter. Reports compliance with all medication, and denied problems with any therapy. We will recommend adding PCSK9i to his therapy.  Current Medications:  Atorvastatin 80mg  daily  Intolerances: none  LDL goal: < 70 mg/dL  Diet: balance diet.  Exercise: activities of daily living.   Family History: family history includes Breast cancer in his mother.   Social History: patient  reports that he quit smoking about 11 years ago. His smoking use included cigarettes. He started smoking about 59 years ago. He has a 40.00 pack-year smoking history. He quit smokeless tobacco use about 21 years ago.  His smokeless tobacco use included chew. He reports previous alcohol use. He reports that he does not use drugs.   Labs: 09/24/20: CHO 156, TG 63, HDL 54, LDL-c 89 (on atorvastatin 80mg )  Past Medical History:  Diagnosis Date  . Chronic combined systolic and diastolic CHF (congestive heart failure) ([place])    followed by Collin Young  . COPD (chronic obstructive pulmonary disease) ([place])   . Coronary artery disease cardiologist-- Collin Collin Young   per cardiac cath 01-05-2017  chronic total occlusion pLCx with collaterals, 99% severe calcified prox. to mid RCA, otherwise mild to moderate CAD (medically managed)  . Esophageal cancer, stage IIIB [facility]) oncologist-  Collin Young/  Collin Young   dx 2010  SCC Stage IIIB completed chemoradiation;  localized recurrent left piriform sinus 01/ 2015,  completed concurrent chemoradiatoin 04/ 2015  . GERD (gastroesophageal  reflux disease)    09-07-2018   no issues since Gtube removed 06/ 2019  . Headache   . History of alcohol abuse    quit 2001  . History of cancer chemotherapy    2010;   2015  . History of radiation therapy    10-19-2013 to  12-05-2013 pyriform sinus 69.96 Gy/65fx;   Radiation completed 2010 for esophageal cancer  . History of seizure 2001   alcohol withdrawal  . HOH (hard of hearing)   . Hyperlipidemia   . Hypertension   . Ischemic cardiomyopathy 12/2016   01-03-2017  echo,  ef 10-15%/   echo 05-06-2017 EF improved to 45-50%  . Lower urinary tract symptoms (LUTS)   . Prostate cancer [facility]) urologist-  Collin Young/  oncologist-- Collin Young   dx 05-27-2018--- Stage T1c,  Gleason 4+3,  PSA 9.3--  scheduled for brachytherapy 09-23-2017  . Pure hypercholesterolemia 09/23/2020  . Renal cyst, left   . Voice hoarseness    secondary to radiation treatment    Current Outpatient Medications on File Prior to Visit  Medication Sig Dispense Refill  . albuterol (VENTOLIN HFA) 108 (90 Base) MCG/ACT inhaler Inhale 2 puffs into the lungs every 6 (six) hours as needed. 18 g 5  . aspirin EC 81 MG tablet Take 81 mg by mouth daily.    Marland Kitchen atorvastatin (LIPITOR) 80 MG tablet Take 1 tablet (80 mg total) by  mouth daily. To lower cholesterol 90 tablet 2  . fentaNYL (DURAGESIC) 12 MCG/HR Place 1 patch onto the skin every 3 (three) days. 10 patch 0  . metoprolol succinate (TOPROL-XL) 25 MG 24 hr tablet TAKE 1/2 TABLET BY MOUTH DAILY. FOLLOW-UP APPOINTMENT* 90 tablet 3  . Multiple Vitamin (MULTIVITAMIN) tablet Take 1 tablet by mouth daily.    . sacubitril-valsartan (ENTRESTO) 24-26 MG Take 1 tablet by mouth 2 (two) times daily. 180 tablet 3  . furosemide (LASIX) 20 MG tablet Take 20 mg by mouth daily. (Patient not taking: Reported on 10/15/2020)    . melatonin 5 MG TABS Take 5 mg by mouth at bedtime as needed. (Patient not taking: Reported on 10/15/2020)     Current Facility-Administered Medications on File Prior  to Visit  Medication Dose Route Frequency Provider Last Rate Last Admin  . topical emolient (BIAFINE) emulsion   Topical Daily Collin Rudd, MD   Given at 01/19/14 [phone]    No Known Allergies  Pure hypercholesterolemia LDL remains above goal for secondary prevention. He is complaint with atorvastatin 80mg  daily and is not a candidate for surgical intervention.   Will add Repatha 140mg  every 14 days to therapy and follow up fasting blood work after 4rd dose. We discussed use, storage, monitoring, and potential side effects during visit. Encouraged patient and daughter to call clinic if questions or assistance needed.    Collin Young PharmD, BCPS, [place] Collin Young [id number] 10/18/2020 4:38 PM

## 2020-10-18 ENCOUNTER — Encounter: Payer: Self-pay | Admitting: Pharmacist

## 2020-10-18 NOTE — Assessment & Plan Note (Signed)
LDL remains above goal for secondary prevention. He is complaint with atorvastatin 80mg  daily and is not a candidate for surgical intervention.   Will add Repatha 140mg  every 14 days to therapy and follow up fasting blood work after 4rd dose. We discussed use, storage, monitoring, and potential side effects during visit. Encouraged patient and daughter to call clinic if questions or assistance needed.

## 2020-10-21 MED ORDER — PRAVASTATIN SODIUM 80 MG PO TABS: 80.0000 mg | ORAL_TABLET | Freq: Every evening | ORAL | 5 refills | Status: DC

## 2020-10-21 NOTE — Telephone Encounter (Signed)
-----   Message from Skeet Latch, MD sent at 10/17/2020  4:53 PM EST ----- Regarding: FW: Statin interaction Spoke with Dawn.  Please switch atorvastatin to pravastatin 80mg .  He already has repeat lipids/CMP pending.  Thanks! ----- Message ----- From: Imagene Gurney Sent: 09/30/2020  12:04 PM EST To: Skeet Latch, MD, Earvin Hansen, LPN Subject: Statin interaction                             Roosevelt Locks with [place] is calling to speak with Dr. Oval Linsey in regards to treating Mr. Linderman's hepatitis C but there are some interactions with his statin's. Please advise.  [phone]

## 2020-10-21 NOTE — Telephone Encounter (Signed)
Advised daughter [name], ok per [facility]

## 2020-10-28 ENCOUNTER — Telehealth: Payer: Self-pay

## 2020-10-28 DIAGNOSIS — I255 Ischemic cardiomyopathy: Secondary | ICD-10-CM

## 2020-10-28 DIAGNOSIS — C12 Malignant neoplasm of pyriform sinus: Secondary | ICD-10-CM

## 2020-10-28 DIAGNOSIS — E7800 Pure hypercholesterolemia, unspecified: Secondary | ICD-10-CM

## 2020-10-28 NOTE — Telephone Encounter (Signed)
-----   Message from Merced Rodriguez-Guzman, [place] sent at 10/28/2020  7:40 AM EST ----- Regarding: FW: Lipid Pleas call patient.  Due to repeat fasting blood work after 4th dose of Repatha.  Lipids and LFTs   ----- Message ----- From: Harrington Challenger, RPH-CPP Sent: 10/28/2020  12:00 AM EST To: Raquel Rodriguez-Guzman, RPH-CPP Subject: Lipid                                          Repeat fasting lipids after 4 doses of Repatha

## 2020-10-28 NOTE — Telephone Encounter (Signed)
Called and spoke with mrs tracey and explained that the pt will need fasting lipid and lft after 4th dose repatha and they voiced understanding

## 2020-10-29 DIAGNOSIS — B182 Chronic viral hepatitis C: Secondary | ICD-10-CM | POA: Insufficient documentation

## 2020-11-01 ENCOUNTER — Other Ambulatory Visit: Payer: Self-pay | Admitting: *Deleted

## 2020-11-01 DIAGNOSIS — C61 Malignant neoplasm of prostate: Secondary | ICD-10-CM

## 2020-11-01 DIAGNOSIS — C12 Malignant neoplasm of pyriform sinus: Secondary | ICD-10-CM

## 2020-11-01 MED ORDER — FENTANYL 12 MCG/HR TD PT72: 1.0000 | MEDICATED_PATCH | TRANSDERMAL | 0 refills | Status: DC

## 2020-11-04 ENCOUNTER — Other Ambulatory Visit: Payer: Self-pay | Admitting: Cardiovascular Disease

## 2020-11-04 DIAGNOSIS — E7800 Pure hypercholesterolemia, unspecified: Secondary | ICD-10-CM

## 2020-11-09 ENCOUNTER — Other Ambulatory Visit: Payer: Self-pay | Admitting: Cardiovascular Disease

## 2020-11-14 ENCOUNTER — Telehealth: Payer: Self-pay | Admitting: Cardiovascular Disease

## 2020-11-14 MED ORDER — REPATHA SURECLICK 140 MG/ML ~~LOC~~ SOAJ: 140.0000 mg | SUBCUTANEOUS | 11 refills | Status: DC

## 2020-11-14 NOTE — Telephone Encounter (Signed)
Linus Orn is calling wanting to know when Collin Young needs to come in for lab work in regards to his [place]. She states he is needing a injection, but they are out of the medication so a refill request has been sent to the refill team. Please advise.

## 2020-11-14 NOTE — Telephone Encounter (Signed)
Called and spoke to pt and stated that he is overdue for lipid panel, rx sen, pt voiced understanding

## 2020-11-14 NOTE — Telephone Encounter (Signed)
*  STAT* If patient is at the pharmacy, call can be transferred to refill team.   1. Which medications need to be refilled? (please list name of each medication and dose if known) Evolocumab (REPATHA SURECLICK) 518 MG/ML SOAJ  2. Which pharmacy/location (including street and city if local pharmacy) is medication to be sent to? [facility] on [address]   3. Do they need a 30 day or 90 day supply? 30 day

## 2020-11-15 LAB — HEPATIC FUNCTION PANEL
ALT: 11 IU/L (ref 0–44)
AST: 24 IU/L (ref 0–40)
Albumin: 4.1 g/dL (ref 3.8–4.8)
Alkaline Phosphatase: 51 IU/L (ref 44–121)
Bilirubin Total: 0.3 mg/dL (ref 0.0–1.2)
Bilirubin, Direct: 0.12 mg/dL (ref 0.00–0.40)
Total Protein: 7.5 g/dL (ref 6.0–8.5)

## 2020-11-15 LAB — LIPID PANEL
Chol/HDL Ratio: 1.8 ratio (ref 0.0–5.0)
Cholesterol, Total: 115 mg/dL (ref 100–199)
HDL: 63 mg/dL (ref >39)
LDL Chol Calc (NIH): 41 mg/dL (ref 0–99)
Triglycerides: 44 mg/dL (ref 0–149)
VLDL Cholesterol Cal: 11 mg/dL (ref 5–40)

## 2020-12-10 ENCOUNTER — Other Ambulatory Visit: Payer: Self-pay | Admitting: *Deleted

## 2020-12-10 DIAGNOSIS — C12 Malignant neoplasm of pyriform sinus: Secondary | ICD-10-CM

## 2020-12-10 DIAGNOSIS — C61 Malignant neoplasm of prostate: Secondary | ICD-10-CM

## 2020-12-10 MED ORDER — FENTANYL 12 MCG/HR TD PT72: 1.0000 | MEDICATED_PATCH | TRANSDERMAL | 0 refills | Status: DC

## 2020-12-23 ENCOUNTER — Other Ambulatory Visit: Payer: Self-pay

## 2020-12-23 DIAGNOSIS — E7800 Pure hypercholesterolemia, unspecified: Secondary | ICD-10-CM

## 2021-01-22 ENCOUNTER — Encounter: Payer: Self-pay | Admitting: Hematology & Oncology

## 2021-01-22 ENCOUNTER — Inpatient Hospital Stay: Payer: Medicare Other | Attending: Hematology & Oncology

## 2021-01-22 ENCOUNTER — Other Ambulatory Visit: Payer: Self-pay

## 2021-01-22 ENCOUNTER — Inpatient Hospital Stay (HOSPITAL_BASED_OUTPATIENT_CLINIC_OR_DEPARTMENT_OTHER): Payer: Medicare Other | Admitting: Hematology & Oncology

## 2021-01-22 VITALS — BP 152/77 | HR 80 | Temp 98.0°F | Resp 20 | Wt 135.0 lb

## 2021-01-22 DIAGNOSIS — Z9221 Personal history of antineoplastic chemotherapy: Secondary | ICD-10-CM | POA: Diagnosis not present

## 2021-01-22 DIAGNOSIS — C12 Malignant neoplasm of pyriform sinus: Secondary | ICD-10-CM | POA: Insufficient documentation

## 2021-01-22 DIAGNOSIS — Z8546 Personal history of malignant neoplasm of prostate: Secondary | ICD-10-CM | POA: Diagnosis not present

## 2021-01-22 DIAGNOSIS — Z8501 Personal history of malignant neoplasm of esophagus: Secondary | ICD-10-CM | POA: Insufficient documentation

## 2021-01-22 DIAGNOSIS — I255 Ischemic cardiomyopathy: Secondary | ICD-10-CM

## 2021-01-22 DIAGNOSIS — I739 Peripheral vascular disease, unspecified: Secondary | ICD-10-CM | POA: Insufficient documentation

## 2021-01-22 DIAGNOSIS — G8929 Other chronic pain: Secondary | ICD-10-CM | POA: Diagnosis not present

## 2021-01-22 DIAGNOSIS — Z923 Personal history of irradiation: Secondary | ICD-10-CM | POA: Diagnosis not present

## 2021-01-22 LAB — CMP (CANCER CENTER ONLY)
ALT: 11 U/L (ref 0–44)
AST: 23 U/L (ref 15–41)
Albumin: 4.1 g/dL (ref 3.5–5.0)
Alkaline Phosphatase: 38 U/L (ref 38–126)
Anion gap: 5 (ref 5–15)
BUN: 19 mg/dL (ref 8–23)
CO2: 37 mmol/L — ABNORMAL HIGH (ref 22–32)
Calcium: 10.2 mg/dL (ref 8.9–10.3)
Chloride: 94 mmol/L — ABNORMAL LOW (ref 98–111)
Creatinine: 1.25 mg/dL — ABNORMAL HIGH (ref 0.61–1.24)
GFR, Estimated: >60 mL/min (ref >60)
Glucose, Bld: 88 mg/dL (ref 70–99)
Potassium: 4.6 mmol/L (ref 3.5–5.1)
Sodium: 136 mmol/L (ref 135–145)
Total Bilirubin: 0.3 mg/dL (ref 0.3–1.2)
Total Protein: 7.6 g/dL (ref 6.5–8.1)

## 2021-01-22 LAB — CBC WITH DIFFERENTIAL (CANCER CENTER ONLY)
Abs Immature Granulocytes: 0 10*3/uL (ref 0.00–0.07)
Basophils Absolute: 0 10*3/uL (ref 0.0–0.1)
Basophils Relative: 1 %
Eosinophils Absolute: 0.1 10*3/uL (ref 0.0–0.5)
Eosinophils Relative: 2 %
HCT: 43.8 % (ref 39.0–52.0)
Hemoglobin: 13.8 g/dL (ref 13.0–17.0)
Immature Granulocytes: 0 %
Lymphocytes Relative: 38 %
Lymphs Abs: 1.6 10*3/uL (ref 0.7–4.0)
MCH: 27.5 pg (ref 26.0–34.0)
MCHC: 31.5 g/dL (ref 30.0–36.0)
MCV: 87.3 fL (ref 80.0–100.0)
Monocytes Absolute: 0.8 10*3/uL (ref 0.1–1.0)
Monocytes Relative: 19 %
Neutro Abs: 1.7 10*3/uL (ref 1.7–7.7)
Neutrophils Relative %: 40 %
Platelet Count: 188 10*3/uL (ref 150–400)
RBC: 5.02 MIL/uL (ref 4.22–5.81)
RDW: 14.2 % (ref 11.5–15.5)
WBC Count: 4.2 10*3/uL (ref 4.0–10.5)
nRBC: 0 % (ref 0.0–0.2)

## 2021-01-22 NOTE — Progress Notes (Signed)
Hematology and Oncology Follow Up Visit  Collin Young [id number] November 04, 1950 70 y.o. 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 is back for followup.  We see him every 6 months.  Since we last saw him, he really has had no problems.  He is very active.  He is eating well.  He is gained weight.  He is swallowing without difficulty.  It has been 7 years since he had treatment for the piriform sinus malignancy.  He had radiation and chemotherapy for this.  It has been 12 years since he had treatment for the esophageal cancer.   He has had no issues with thyroid.  We will check his thyroid every time we see him.  His last TSH was 2.2.  He has had no change in bowel or bladder habits.  He has had no problems with COVID.  He does have peripheral vascular disease.  He is being followed by vascular surgery for this.  He does have a history of stage I prostate cancer.  I am unsure if he sees the urologist any longer.  He has had no bleeding.  He has had no headache.  Overall, his performance status is ECOG 1.     Medications:  Current Outpatient Medications:  .  aspirin EC 81 MG tablet, Take 81 mg by mouth daily., Disp: , Rfl:  .  ENTRESTO 24-26 MG, TAKE 1 TABLET BY MOUTH TWICE DAILY, Disp: 180 tablet, Rfl: 3 .  Evolocumab (REPATHA SURECLICK) 802 MG/ML SOAJ, Inject 140 mg into the skin every 14 (fourteen) days., Disp: 2 mL, Rfl: 11 .  fentaNYL (DURAGESIC) 12 MCG/HR, Place 1 patch onto the skin every 3 (three) days., Disp: 10 patch, Rfl: 0 .  furosemide (LASIX) 20 MG tablet, Take 20 mg by mouth daily., Disp: , Rfl:  .  melatonin 5 MG TABS, Take 5 mg by  mouth at bedtime as needed., Disp: , Rfl:  .  metoprolol succinate (TOPROL-XL) 25 MG 24 hr tablet, TAKE 1/2 TABLET BY MOUTH DAILY. FOLLOW-UP APPOINTMENT*, Disp: 90 tablet, Rfl: 3 .  Multiple Vitamin (MULTIVITAMIN) tablet, Take 1 tablet by mouth daily., Disp: , Rfl:  .  Sofosbuvir-Velpatasvir 400-100 MG TABS, Take 1 tablet by mouth daily., Disp: , Rfl:  .  pravastatin (PRAVACHOL) 80 MG tablet, Take 1 tablet (80 mg total) by mouth every evening., Disp: 30 tablet, Rfl: 5 .  SPIRIVA HANDIHALER 18 MCG inhalation capsule, 1 capsule daily. (Patient not taking: Reported on 01/22/2021), Disp: , Rfl:  No current facility-administered medications for this visit.  Facility-Administered Medications Ordered in Other Visits:  .  topical emolient (BIAFINE) emulsion, , Topical, Daily, Kyung Rudd, MD, Given at 01/19/14 2336  Allergies: No Known Allergies  Past Medical History, Surgical history, Social history, and Family History were reviewed and updated.  Review of Systems: Review of Systems  Constitutional: Negative.   HENT: Negative.   Eyes: Negative.   Respiratory: Negative.   Cardiovascular: Negative.   Gastrointestinal: Negative.   Genitourinary: Negative.   Musculoskeletal: Negative.   Skin: Negative.   Neurological: Negative.   Endo/Heme/Allergies: Negative.   Psychiatric/Behavioral: Negative.      Physical Exam:  weight is 135 lb (61.2 kg). His oral temperature is 98 F (36.7 C). His blood pressure is 152/77 (abnormal) and his pulse is 80. His respiration is 20 and oxygen saturation is 100%.   Physical Exam Vitals reviewed.  HENT:     Head: Normocephalic and atraumatic.  Eyes:     Pupils: Pupils are equal, round, and reactive to light.  Cardiovascular:     Rate and Rhythm: Normal rate and regular rhythm.     Heart sounds: Normal heart sounds.  Pulmonary:     Effort: Pulmonary effort is normal.     Breath sounds: Normal breath sounds.  Abdominal:     General: Bowel sounds are  normal.     Palpations: Abdomen is soft.     Comments: Abdominal exam shows a soft abdomen.  Bowel sounds are present.  He has the healed G-tube opening.  There is no exudate.  He has no fluid wave.  There is no palpable liver or spleen tip.  Musculoskeletal:        General: No tenderness or deformity. Normal range of motion.     Cervical back: Normal range of motion.  Lymphadenopathy:     Cervical: No cervical adenopathy.  Skin:    General: Skin is warm and dry.     Findings: No erythema or rash.  Neurological:     Mental Status: He is alert and oriented to person, place, and time.  Psychiatric:        Behavior: Behavior normal.        Thought Content: Thought content normal.        Judgment: Judgment normal.     Lab Results  Component Value Date   WBC 4.2 01/22/2021   HGB 13.8 01/22/2021   HCT 43.8 01/22/2021   MCV 87.3 01/22/2021   PLT 188 01/22/2021     Chemistry      Component Value Date/Time   NA 136 01/22/2021 1423   NA 135 05/29/2020 1556   NA 139 07/30/2017 0926   NA 137 03/10/2017 1000   K 4.6 01/22/2021 1423   K 3.9 07/30/2017 0926   K 4.3 03/10/2017 1000   CL 94 (L) 01/22/2021 1423   CL 96 (L) 07/30/2017 0926   CO2 37 (H) 01/22/2021 1423   CO2 33 07/30/2017 0926   CO2 29 03/10/2017 1000   BUN 19 01/22/2021 1423   BUN 23 05/29/2020 1556   BUN 17 07/30/2017 0926   BUN 14.7 03/10/2017 1000   CREATININE 1.25 (H) 01/22/2021 1423   CREATININE 1.4 (H) 07/30/2017 0926   CREATININE 1.1 03/10/2017 1000      Component Value Date/Time   CALCIUM 10.2 01/22/2021 1423   CALCIUM 9.6 07/30/2017 0926   CALCIUM 10.0 03/10/2017 1000   ALKPHOS 38 01/22/2021 1423   ALKPHOS 49 07/30/2017 0926   ALKPHOS 51 03/10/2017 1000   AST 23 01/22/2021 1423   AST 23 03/10/2017 1000   ALT 11 01/22/2021 1423   ALT 31 07/30/2017 0926   ALT 18 03/10/2017 1000   BILITOT 0.3 01/22/2021 1423   BILITOT 0.41 03/10/2017 1000       Impression and Plan: Collin Young is 70 year old  gentleman. He had a localized recurrence of carcinoma of the left pyriform sinus. He underwent concurrent chemotherapy and radiation therapy. He finished his treatment back in April of 2015.   Overall, I would have to say that he is pretty stable.  He does have his other health issues.  Most  of this is peripheral vascular disease.  Again is being followed closely by vascular surgery.  I really do not not think that we have to do any scans on him.  I just do not think we would have a high yield in finding any recurrent disease.  We will still plan for follow-up in 6 months.  He has a good quality of life.  He is on fentanyl patches for his chronic pain and these really seem to help.       [name], MD 5/25/20223:08 PM

## 2021-01-29 ENCOUNTER — Other Ambulatory Visit: Payer: Self-pay | Admitting: *Deleted

## 2021-01-29 DIAGNOSIS — C61 Malignant neoplasm of prostate: Secondary | ICD-10-CM

## 2021-01-29 DIAGNOSIS — C12 Malignant neoplasm of pyriform sinus: Secondary | ICD-10-CM

## 2021-01-29 MED ORDER — FENTANYL 12 MCG/HR TD PT72: 1.0000 | MEDICATED_PATCH | TRANSDERMAL | 0 refills | Status: DC

## 2021-02-04 ENCOUNTER — Other Ambulatory Visit: Payer: Self-pay | Admitting: Cardiovascular Disease

## 2021-02-04 DIAGNOSIS — K7401 Hepatic fibrosis, early fibrosis: Secondary | ICD-10-CM | POA: Insufficient documentation

## 2021-02-11 NOTE — Progress Notes (Signed)
[facility] [address], [place], [place] [address] [phone]  Date:  02/17/2021   Name:  Collin Young   DOB:  12/23/1950   MRN:  [id number]  PCP:  Darreld Mclean, MD    Chief Complaint: Follow-up (6 month follow up, left foot itching) and Wheezing (Woke up this morning wheezing, rattling in chest/)   History of Present Illness:  Collin Young is a 70 y.o. very pleasant male patient who presents with the following:  Patient seen today for follow-up visit Most recent visit with myself was in December He has history of esophageal cancer, piriform sinus cancer status post chemo and radiation 2015 as well as feeding tube since reversed, prostate cancer, CHF/CAD, chronic hepatitis C  Gastroenterologist is Dr. Oretha Caprice His primary cardiologist is Dr. Skeet Latch   He is divorced, has 2 grown daughters, several children and great-grandchildren He is retired from his work in the [employer] patches for chronic pain and head and neck  He saw his oncologist, Dr. Marin Olp last month-cancer treatment completed 2015, currently stable from a head and neck cancer standpoint  Cardiology pharmacist visit in February-Repatha was added for hyperlipidemia Most recent cardiology visit with Dr. Oval Linsey in January: # Chronic systolic and diastolic heart failure: # Moderate MR: # Moderate AR: LVEF significantly improved from 10-15% to 45-50%.  However at his last appointment he had worse heart failure symptoms and was referred for an echo that showed his EF has reduced to 35 to 40%.  He is doing much better symptomatically on Entresto.  Furosemide was reduced due to dizziness.  He is doing much better.  Recommended starting Jardiance at his last appointment.  Given that his blood pressure is low and that this can cause orthostatic hypotension, we will defer that for now. # Obstructive CAD: #  Hyperlipidemia:  Medically managed due to acute illness and frailty at the time of diagnosis.  100% LCx and 99% RCA.  The RCA would require hemodynamic support.  He has no chest pain so we will continue medical management for now. Continue aspirin and atorvastatin.  He has no chest pain but has complex coronary disease.  He is still quite frail and would be high risk for surgery.  Would only consider for refractory symptoms of ischemia or heart failure.  LDL was 147 when checked 12/2019.  Repeat lipids.  Continue atorvastatin   He is followed by transplant medicine with [facility] due to chronic viral hepatitis-he has completed treatment with Epclusa and has been noted to have undetectable hep C RNA  ?  Urology/prostate cancer history.  He had radioactive seed implant in 2020- he is a pt at [facility] and I will request his records report   Shingles vaccine- encouraged  COVID-19 booster- encouraged him to get the 4th dose  He also has history of chronic syphilis, he received 3 doses of Bicillin in 2021.  I do not see where he has continued to see infectious disease as directed  Wt Readings from Last 3 Encounters:  02/17/21 134 lb (60.8 kg)  01/22/21 135 lb (61.2 kg)  10/15/20 137 lb (62.1 kg)   He has noted some issues with itching of his left foot andtoes for about 2 weeks He is using topical benadryl which does help He is not sure what causes itching, he does spend some time doing work outdoors. Maintaining his weight mostly-  has lost a couple of lbs  He noticed some "ratting" in this chest this am but now improved       Patient Active Problem List   Diagnosis Date Noted   Pure hypercholesterolemia 09/23/2020   Claudication in peripheral vascular disease ([place]) 01/25/2020   Late latent syphilis 01/15/2020   CAD (coronary artery disease) 07/05/2019   CRI (chronic renal insufficiency), stage 3 (moderate) ([place]) 07/05/2019   Combined systolic and diastolic heart failure ([place])  09/26/2018   Malignant neoplasm of prostate ([place]) 06/17/2018   Medication management    Dysphagia    Ischemic cardiomyopathy    Congestive dilated cardiomyopathy ([facility])    Malignant neoplasm of pyriform sinus ([place]) 09/28/2013   Piriform sinus tumor 09/24/2013   NONSPECIFIC ABN FINDING RAD & OTH EXAM GI TRACT 05/14/2009    Past Medical History:  Diagnosis Date   Chronic combined systolic and diastolic CHF (congestive heart failure) ([place])    followed by [name]   COPD (chronic obstructive pulmonary disease) ([place])    Coronary artery disease cardiologist-- [name]   per cardiac cath 01-05-2017  chronic total occlusion pLCx with collaterals, 99% severe calcified prox. to mid RCA, otherwise mild to moderate CAD (medically managed)   Esophageal cancer, stage IIIB [facility]) oncologist-  dr ennever/  dr moody   dx 2010  SCC Stage IIIB completed chemoradiation;  localized recurrent left piriform sinus 01/ 2015,  completed concurrent chemoradiatoin 04/ 2015   GERD (gastroesophageal reflux disease)    09-07-2018   no issues since Gtube removed 06/ 2019   Headache    History of alcohol abuse    quit 2001   History of cancer chemotherapy    2010;   2015   History of radiation therapy    10-19-2013 to  12-05-2013 pyriform sinus 69.96 Gy/39fx;   Radiation completed 2010 for esophageal cancer   History of seizure 2001   alcohol withdrawal   HOH (hard of hearing)    Hyperlipidemia    Hypertension    Ischemic cardiomyopathy 12/2016   01-03-2017  echo,  ef 10-15%/   echo 05-06-2017 EF improved to 45-50%   Lower urinary tract symptoms (LUTS)    Prostate cancer [facility]) urologist-  dr ottelin/  oncologist-- [name]   dx 05-27-2018--- Stage T1c,  Gleason 4+3,  PSA 9.3--  scheduled for brachytherapy 09-23-2017   Pure hypercholesterolemia 09/23/2020   Renal cyst, left    Voice hoarseness    secondary to radiation treatment    Past Surgical History:  Procedure Laterality Date    CARDIOVASCULAR STRESS TEST  10/09/09   normal nuclearr stress test, EF 57% ([place])   IR GASTROSTOMY TUBE MOD SED  01/13/2017   IR GASTROSTOMY TUBE REMOVAL  02/03/2018   IR PATIENT EVAL TECH 0-60 MINS  03/25/2017   IR REMOVAL TUN ACCESS W/ PORT W/O FL MOD SED  01/15/2017   IR REPLACE G-TUBE SIMPLE WO FLUORO  01/13/2018   LARYNGOSCOPY N/A 09/15/2013   Procedure: LARYNGOSCOPY;  Surgeon: [name], MD;  Location: [facility];  Service: ENT;  Laterality: N/A;  direct laryngoscopy with biopsy and esophagoscopy   RADIOACTIVE SEED IMPLANT N/A 09/23/2018   Procedure: RADIOACTIVE SEED IMPLANT/BRACHYTHERAPY IMPLANT;  Surgeon: [name], MD;  Location: [facility];  Service: Urology;  Laterality: N/A;   RIGHT/LEFT HEART CATH AND CORONARY ANGIOGRAPHY N/A 01/05/2017   Procedure: Right/Left Heart Cath and Coronary Angiography;  Surgeon: [name], MD;  Location: [facility] INVASIVE CV  LAB;  Service: Cardiovascular;  Laterality: N/A;   TRANSTHORACIC ECHOCARDIOGRAM  05/06/2017   ef 45-50%,  grade 1 diastolic dysfunction/  AV severe calcified non coronary cusp with moderate regurg. , no stenosis (valve area per echo 01-05-2017 1.08cm^2)/  mild MR, TR, and PR/  mild LAE    Social History   Tobacco Use   Smoking status: Former    Packs/day: 1.00    Years: 40.00    Pack years: 40.00    Types: Cigarettes    Start date: 11/08/1960    Quit date: 08/31/2009    Years since quitting: 11.4   Smokeless tobacco: Former    Types: Chew    Quit date: 09/01/1999  Vaping Use   Vaping Use: Never used  Substance Use Topics   Alcohol use: Not Currently    Alcohol/week: 0.0 standard drinks    Comment: quit in 2001   Drug use: No    Comment: back in the day used cocaine,alcohol, marijuana    Family History  Problem Relation Age of Onset   Breast cancer Mother    Prostate cancer Neg Hx    Colon cancer Neg Hx    Pancreatic cancer Neg Hx     No Known Allergies  Medication list has been reviewed and  updated.  Current Outpatient Medications on File Prior to Visit  Medication Sig Dispense Refill   aspirin EC 81 MG tablet Take 81 mg by mouth daily.     ENTRESTO 24-26 MG TAKE 1 TABLET BY MOUTH TWICE DAILY 180 tablet 3   Evolocumab (REPATHA SURECLICK) 161 MG/ML SOAJ Inject 140 mg into the skin every 14 (fourteen) days. 2 mL 11   fentaNYL (DURAGESIC) 12 MCG/HR Place 1 patch onto the skin every 3 (three) days. 10 patch 0   metoprolol succinate (TOPROL-XL) 25 MG 24 hr tablet TAKE 1/2 TABLET BY MOUTH DAILY. FOLLOW-UP APPOINTMENT* 90 tablet 0   Multiple Vitamin (MULTIVITAMIN) tablet Take 1 tablet by mouth daily.     Sofosbuvir-Velpatasvir 400-100 MG TABS Take 1 tablet by mouth daily.     pravastatin (PRAVACHOL) 80 MG tablet Take 1 tablet (80 mg total) by mouth every evening. 30 tablet 5   Current Facility-Administered Medications on File Prior to Visit  Medication Dose Route Frequency Provider Last Rate Last Admin   topical emolient (BIAFINE) emulsion   Topical Daily Kyung Rudd, MD   Given at 01/19/14 0960    Review of Systems:  As per HPI- otherwise negative.   Physical Examination: Vitals:   02/17/21 1307  BP: (!) 144/82  Pulse: 86  Resp: 18  Temp: 97.7 F (36.5 C)  SpO2: 98%   Vitals:   02/17/21 1307  Weight: 134 lb (60.8 kg)  Height: 5\' 4"  (1.626 m)   Body mass index is 23 kg/m. Ideal Body Weight: Weight in (lb) to have BMI = 25: 145.3  GEN: no acute distress.  Petite build, looks well and his normal self.  He has a hoarse voice which is his baseline HEENT: Atraumatic, Normocephalic.  Ears and Nose: No external deformity. CV: RRR, No M/G/R. No JVD. No thrill. No extra heart sounds. PULM: CTA B, no wheezes, crackles, rhonchi. No retractions. No resp. distress. No accessory muscle use. ABD: S, NT, ND, +BS. No rebound. No HSM. EXTR: No c/c/e PSYCH: Normally interactive. Conversant.  Left foot:  there is one fluid filed blister -approximately 5 mm diameter -on the  lateral foot-got consent from patient, then wiped blister with alcohol pad and  lanced with a needle.  Some clear fluid drained He also has some mild redness and irritated looking skin on the lateral foot, some evidence of tinea pedis between the toes  BP Readings from Last 3 Encounters:  02/17/21 (!) 144/82  01/22/21 (!) 152/77  10/15/20 126/82    Assessment and Plan: Piriform sinus tumor  Coronary artery disease involving native coronary artery of native heart without angina pectoris  CRI (chronic renal insufficiency), stage 3 (moderate) ([place])  Pure hypercholesterolemia  Dermatitis of left foot - Plan: predniSONE (DELTASONE) 20 MG tablet  Immunization due  Patient here today to follow-up on health maintenance He is keeping up with all of his specialist, oncology and cardiology especially Encouraged him to keep up his calorie intake to maintain weight Will treat dermatitis of his foot with a short course of prednisone, also suggest topical antifungals Asked him to let me know if this clears up his dermatitis   Encouraged covid booster and shingles vaccine  This visit occurred during the SARS-CoV-2 public health emergency.  Safety protocols were in place, including screening questions prior to the visit, additional usage of staff PPE, and extensive cleaning of exam room while observing appropriate contact time as indicated for disinfecting solutions.   Signed Lamar Blinks, MD

## 2021-02-13 ENCOUNTER — Other Ambulatory Visit: Payer: Self-pay | Admitting: Cardiovascular Disease

## 2021-02-14 ENCOUNTER — Telehealth: Payer: Self-pay | Admitting: Cardiovascular Disease

## 2021-02-14 MED ORDER — METOPROLOL SUCCINATE ER 25 MG PO TB24: ORAL_TABLET | ORAL | 0 refills | Status: DC

## 2021-02-14 NOTE — Telephone Encounter (Signed)
   *  STAT* If patient is at the pharmacy, call can be transferred to refill team.   1. Which medications need to be refilled? (please list name of each medication and dose if known)   metoprolol succinate (TOPROL-XL) 25 MG 24 hr tablet    2. Which pharmacy/location (including street and city if local pharmacy) is medication to be sent to? [facility] [place], [place]  3. Do they need a 30 day or 90 day supply? 90 days   Pt made an appt with Dr. Oval Linsey. Also need refill today he is out of meds

## 2021-02-17 ENCOUNTER — Encounter: Payer: Self-pay | Admitting: Family Medicine

## 2021-02-17 ENCOUNTER — Other Ambulatory Visit: Payer: Self-pay

## 2021-02-17 ENCOUNTER — Other Ambulatory Visit (HOSPITAL_BASED_OUTPATIENT_CLINIC_OR_DEPARTMENT_OTHER): Payer: Self-pay

## 2021-02-17 ENCOUNTER — Ambulatory Visit: Payer: Medicare Other | Attending: Internal Medicine

## 2021-02-17 ENCOUNTER — Ambulatory Visit (INDEPENDENT_AMBULATORY_CARE_PROVIDER_SITE_OTHER): Payer: Medicare Other | Admitting: Family Medicine

## 2021-02-17 VITALS — BP 144/82 | HR 86 | Temp 97.7°F | Resp 18 | Ht 64.0 in | Wt 134.0 lb

## 2021-02-17 DIAGNOSIS — E7800 Pure hypercholesterolemia, unspecified: Secondary | ICD-10-CM | POA: Diagnosis not present

## 2021-02-17 DIAGNOSIS — Z23 Encounter for immunization: Secondary | ICD-10-CM

## 2021-02-17 DIAGNOSIS — I2510 Atherosclerotic heart disease of native coronary artery without angina pectoris: Secondary | ICD-10-CM

## 2021-02-17 DIAGNOSIS — L309 Dermatitis, unspecified: Secondary | ICD-10-CM

## 2021-02-17 DIAGNOSIS — D490 Neoplasm of unspecified behavior of digestive system: Secondary | ICD-10-CM | POA: Diagnosis not present

## 2021-02-17 DIAGNOSIS — N183 Chronic kidney disease, stage 3 unspecified: Secondary | ICD-10-CM | POA: Diagnosis not present

## 2021-02-17 MED ORDER — COVID-19 MRNA VACC (MODERNA) 100 MCG/0.5ML IM SUSP
INTRAMUSCULAR | 0 refills | Status: DC
  Filled 2021-02-17: qty 0.25, 1d supply, fill #0

## 2021-02-17 MED ORDER — PREDNISONE 20 MG PO TABS: ORAL_TABLET | ORAL | 0 refills | Status: DC

## 2021-02-17 NOTE — Patient Instructions (Addendum)
Good to see you again today- we will request your urology reports from [facility] for you Please consider getting the 2nd covid booster/ 4th dose - this can be given at the pharmacy downstairs today if you like  I would also suggest that you get the shingles vaccine at your convenience   I am going to give you a short course of prednisone for your itchy foot- take one tablet a day for 5 days, ok to continue benadryl cream as desired.  Also, you have some moisture between your toes- try an OTC athlete's foot spray to dry this up  Please let me know if your foot is not doing better in the next few days  Please see me in about 4 months for a recheck and labs

## 2021-02-18 NOTE — Progress Notes (Signed)
   Covid-19 Vaccination Clinic  Name:  Collin Young    MRN: [id number] DOB: 26-Jun-1951  02/18/2021  Mr. Zacharia was observed post Covid-19 immunization for 15 minutes without incident. He was provided with Vaccine Information Sheet and instruction to access the V-Safe system.   Mr. Sawchuk was instructed to call 911 with any severe reactions post vaccine: Difficulty breathing  Swelling of face and throat  A fast heartbeat  A bad rash all over body  Dizziness and weakness   Immunizations Administered     Name Date Dose VIS Date Route   Moderna Covid-19 Booster Vaccine 02/17/2021  1:36 PM 0.25 mL 06/19/2020 Intramuscular   Manufacturer: Moderna   Lot: [id number]   [place]: [id number]

## 2021-03-18 ENCOUNTER — Other Ambulatory Visit: Payer: Self-pay | Admitting: *Deleted

## 2021-03-18 DIAGNOSIS — C61 Malignant neoplasm of prostate: Secondary | ICD-10-CM

## 2021-03-18 DIAGNOSIS — C12 Malignant neoplasm of pyriform sinus: Secondary | ICD-10-CM

## 2021-03-18 MED ORDER — FENTANYL 12 MCG/HR TD PT72: 1.0000 | MEDICATED_PATCH | TRANSDERMAL | 0 refills | Status: DC

## 2021-03-27 NOTE — Patient Instructions (Addendum)
Please consider getting the shingles vaccine at your pharmacy at your convenience Today we will get a chest x-ray to make sure your lungs are clear You can continue using your cough syrup as needed I also prescribed an inhaler you can use as needed for cough or wheezing

## 2021-03-27 NOTE — Progress Notes (Signed)
[facility] [place], [place], [place] [address] [phone]  Date:  03/28/2021   Name:  Collin Young   DOB:  02/26/1951   MRN:  [id number]  PCP:  Darreld Mclean, MD    Chief Complaint: Cough (Phem - sharp pain when coughing )   History of Present Illness:  Collin Young is a 70 y.o. very pleasant male patient who presents with the following:  Patient whom I last saw in June, here today with concern of possible illness He has history of esophageal cancer, piriform sinus cancer status post chemo and radiation 2015 as well as feeding tube since reversed, prostate cancer, CHF/CAD, chronic hepatitis C  [facility] is [facility] for chronic viral hepatitis Most recent visit with oncology was in [place]; at that point he was stable from his left piriform sinus cancer  Today pt notes he developed a cough which was painful He first noticed the cough some time last week No fever He might cough up some phlegm- however clear mucus  He has used a cough syrup the last 2 days and feel better now He notes that he is overall feeling ok now  No runny nose or ST  Patient Active Problem List   Diagnosis Date Noted   Pure hypercholesterolemia 09/23/2020   Claudication in peripheral vascular disease ([place]) 01/25/2020   Late latent syphilis 01/15/2020   CAD (coronary artery disease) 07/05/2019   CRI (chronic renal insufficiency), stage 3 (moderate) ([place]) 07/05/2019   Combined systolic and diastolic heart failure ([place]) 09/26/2018   Malignant neoplasm of prostate ([place]) 06/17/2018   Medication management    Dysphagia    Ischemic cardiomyopathy    Congestive dilated cardiomyopathy ([place])    Malignant neoplasm of pyriform sinus ([place]) 09/28/2013   Piriform sinus tumor 09/24/2013   NONSPECIFIC ABN FINDING RAD & OTH EXAM GI TRACT 05/14/2009    Past Medical History:  Diagnosis Date   Chronic  combined systolic and diastolic CHF (congestive heart failure) ([place])    followed by [name]   COPD (chronic obstructive pulmonary disease) ([place])    Coronary artery disease cardiologist-- [name]   per cardiac cath 01-05-2017  chronic total occlusion pLCx with collaterals, 99% severe calcified prox. to mid RCA, otherwise mild to moderate CAD (medically managed)   Esophageal cancer, stage IIIB [facility]) oncologist-  dr ennever/  dr moody   dx 2010  SCC Stage IIIB completed chemoradiation;  localized recurrent left piriform sinus 01/ 2015,  completed concurrent chemoradiatoin 04/ 2015   GERD (gastroesophageal reflux disease)    09-07-2018   no issues since Gtube removed 06/ 2019   Headache    History of alcohol abuse    quit 2001   History of cancer chemotherapy    2010;   2015   History of radiation therapy    10-19-2013 to  12-05-2013 pyriform sinus 69.96 Gy/50f;   Radiation completed 2010 for esophageal cancer   History of seizure 2001   alcohol withdrawal   HOH (hard of hearing)    Hyperlipidemia    Hypertension    Ischemic cardiomyopathy 12/2016   01-03-2017  echo,  ef 10-15%/   echo 05-06-2017 EF improved to 45-50%   Lower urinary tract symptoms (LUTS)    Prostate cancer ([facility] urologist-  dr ottelin/  oncologist-- dr mTammi Klippel  dx 05-27-2018--- Stage T1c,  Gleason 4+3,  PSA 9.3--  scheduled for brachytherapy 09-23-2017   Pure hypercholesterolemia 09/23/2020   Renal cyst, left    Voice hoarseness    secondary to radiation treatment    Past Surgical History:  Procedure Laterality Date   CARDIOVASCULAR STRESS TEST  10/09/09   normal nuclearr stress test, EF 57% ([place])   IR GASTROSTOMY TUBE MOD SED  01/13/2017   IR GASTROSTOMY TUBE REMOVAL  02/03/2018   IR PATIENT EVAL TECH 0-60 MINS  03/25/2017   IR REMOVAL TUN ACCESS W/ PORT W/O FL MOD SED  01/15/2017   IR REPLACE G-TUBE SIMPLE WO FLUORO  01/13/2018   LARYNGOSCOPY N/A 09/15/2013   Procedure: LARYNGOSCOPY;   Surgeon: [name], MD;  Location: [facility];  Service: ENT;  Laterality: N/A;  direct laryngoscopy with biopsy and esophagoscopy   RADIOACTIVE SEED IMPLANT N/A 09/23/2018   Procedure: RADIOACTIVE SEED IMPLANT/BRACHYTHERAPY IMPLANT;  Surgeon: [name], MD;  Location: [facility];  Service: Urology;  Laterality: N/A;   RIGHT/LEFT HEART CATH AND CORONARY ANGIOGRAPHY N/A 01/05/2017   Procedure: Right/Left Heart Cath and Coronary Angiography;  Surgeon: [name], MD;  Location: [facility];  Service: Cardiovascular;  Laterality: N/A;   TRANSTHORACIC ECHOCARDIOGRAM  05/06/2017   ef 45-50%,  grade 1 diastolic dysfunction/  AV severe calcified non coronary cusp with moderate regurg. , no stenosis (valve area per echo 01-05-2017 1.08cm^2)/  mild MR, TR, and PR/  mild LAE    Social History   Tobacco Use   Smoking status: Former    Packs/day: 1.00    Years: 40.00    Pack years: 40.00    Types: Cigarettes    Start date: 11/08/1960    Quit date: 08/31/2009    Years since quitting: 11.5   Smokeless tobacco: Former    Types: Chew    Quit date: 09/01/1999  Vaping Use   Vaping Use: Never used  Substance Use Topics   Alcohol use: Not Currently    Alcohol/week: 0.0 standard drinks    Comment: quit in 2001   Drug use: No    Comment: back in the day used cocaine,alcohol, marijuana    Family History  Problem Relation Age of Onset   Breast cancer Mother    Prostate cancer Neg Hx    Colon cancer Neg Hx    Pancreatic cancer Neg Hx     No Known Allergies  Medication list has been reviewed and updated.  Current Outpatient Medications on File Prior to Visit  Medication Sig Dispense Refill   aspirin EC 81 MG tablet Take 81 mg by mouth daily.     ENTRESTO 24-26 MG TAKE 1 TABLET BY MOUTH TWICE DAILY 180 tablet 3   Evolocumab (REPATHA SURECLICK) [id number] MG/ML SOAJ Inject 140 mg into the skin every 14 (fourteen) days. 2 mL 11   fentaNYL (DURAGESIC) 12 MCG/HR Place 1  patch onto the skin every 3 (three) days. 10 patch 0   metoprolol succinate (TOPROL-XL) 25 MG 24 hr tablet TAKE 1/2 TABLET BY MOUTH DAILY. FOLLOW-UP APPOINTMENT* 90 tablet 0   Multiple Vitamin (MULTIVITAMIN) tablet Take 1 tablet by mouth daily.     pravastatin (PRAVACHOL) 80 MG tablet Take 1 tablet (80 mg total) by mouth every evening. 30 tablet 5   Sofosbuvir-Velpatasvir 400-100 MG TABS Take 1 tablet by mouth daily. (Patient not taking: Reported on 03/28/2021)     Current Facility-Administered Medications on File Prior to Visit  Medication Dose Route Frequency Provider Last Rate Last  Admin   topical emolient (BIAFINE) emulsion   Topical Daily Kyung Rudd, MD   Given at 01/19/14 [id number]    Review of Systems:  As per HPI- otherwise negative. BP Readings from Last 3 Encounters:  03/28/21 100/80  02/17/21 (!) 144/82  01/22/21 (!) 152/77     Physical Examination: Vitals:   03/28/21 1453  BP: 100/80  Pulse: 85  Temp: (!) 97.3 F (36.3 C)  SpO2: 97%   Vitals:   03/28/21 1453  Weight: 135 lb 12.8 oz (61.6 kg)  Height: '5\' 6"'$  (1.676 m)   Body mass index is 21.92 kg/m. Ideal Body Weight: Weight in (lb) to have BMI = 25: 154.6  GEN: no acute distress.  Slim build, looks his normal self.  He has chronic voice changes due to cancer treatment HEENT: Atraumatic, Normocephalic.  Ears and Nose: No external deformity. CV: RRR, No M/G/R. No JVD. No thrill. No extra heart sounds. PULM: CTA B, no crackles, rhonchi. No retractions. No resp. distress. No accessory muscle use.  Mild wheezing is present bilaterally EXTR: No c/c/e PSYCH: Normally interactive. Conversant.    Assessment and Plan: Cough - Plan: DG Chest 2 View  Wheezing - Plan: DISCONTINUED: albuterol (VENTOLIN HFA) 108 (90 Base) MCG/ACT inhaler Patient seen today with concern of cough.  He has noticed a cough for several days, actually feeling better today.  However, on exam I did note wheezing.  I prescribed albuterol inhaler  for him to use as needed.  We will also obtain a chest film today This visit occurred during the SARS-CoV-2 public health emergency.  Safety protocols were in place, including screening questions prior to the visit, additional usage of staff PPE, and extensive cleaning of exam room while observing appropriate contact time as indicated for disinfecting solutions.   Signed Lamar Blinks, MD  Received his chest film as below Looking back, he was noted to have small right pleural effusion about a year ago DG Chest 2 View  Result Date: 03/28/2021 CLINICAL DATA:  Cough EXAM: CHEST - 2 VIEW COMPARISON:  04/17/2020 FINDINGS: The heart size and mediastinal contours are within normal limits. Trace bilateral pleural effusions. The visualized skeletal structures are unremarkable. IMPRESSION: Trace bilateral pleural effusions. No acute appearing airspace opacity. Electronically Signed   By: Eddie Candle M.D.   On: 03/28/2021 16:33   Message to pt Your lungs are clear, except there is a tiny amount of fluid in the lung bases.  I reviewed your films myself and the amount of fluid is really very small.  As long as you are feeling ok I think we can repeat an x-ray in 2 months or so- however, if you continue to cough or if feeling worse please contact me right away.  Otherwise, please see me in 2 month for follow-up  JC

## 2021-03-28 ENCOUNTER — Encounter: Payer: Self-pay | Admitting: Family Medicine

## 2021-03-28 ENCOUNTER — Other Ambulatory Visit: Payer: Self-pay | Admitting: Family Medicine

## 2021-03-28 ENCOUNTER — Ambulatory Visit (HOSPITAL_BASED_OUTPATIENT_CLINIC_OR_DEPARTMENT_OTHER)
Admission: RE | Admit: 2021-03-28 | Discharge: 2021-03-28 | Disposition: A | Discharge status: Home / Self Care | Payer: Medicare Other | Source: Ambulatory Visit | Attending: Family Medicine | Admitting: Family Medicine

## 2021-03-28 ENCOUNTER — Other Ambulatory Visit: Payer: Self-pay

## 2021-03-28 ENCOUNTER — Ambulatory Visit (INDEPENDENT_AMBULATORY_CARE_PROVIDER_SITE_OTHER): Payer: Medicare Other | Admitting: Family Medicine

## 2021-03-28 VITALS — BP 100/80 | HR 85 | Temp 97.3°F | Ht 66.0 in | Wt 135.8 lb

## 2021-03-28 DIAGNOSIS — R059 Cough, unspecified: Secondary | ICD-10-CM

## 2021-03-28 DIAGNOSIS — R062 Wheezing: Secondary | ICD-10-CM | POA: Diagnosis not present

## 2021-03-28 DIAGNOSIS — J90 Pleural effusion, not elsewhere classified: Secondary | ICD-10-CM | POA: Diagnosis not present

## 2021-03-28 MED ORDER — ALBUTEROL SULFATE HFA 108 (90 BASE) MCG/ACT IN AERS
2.0000 | INHALATION_SPRAY | Freq: Four times a day (QID) | RESPIRATORY_TRACT | 0 refills | Status: DC | PRN
Start: 2021-03-28 — End: 2021-03-28

## 2021-04-01 ENCOUNTER — Telehealth: Payer: Self-pay

## 2021-04-15 DIAGNOSIS — Z79899 Other long term (current) drug therapy: Secondary | ICD-10-CM | POA: Diagnosis not present

## 2021-04-15 DIAGNOSIS — K7401 Hepatic fibrosis, early fibrosis: Secondary | ICD-10-CM | POA: Diagnosis not present

## 2021-04-30 ENCOUNTER — Other Ambulatory Visit (HOSPITAL_BASED_OUTPATIENT_CLINIC_OR_DEPARTMENT_OTHER): Payer: Self-pay | Admitting: *Deleted

## 2021-04-30 MED ORDER — PRAVASTATIN SODIUM 80 MG PO TABS: 80.0000 mg | ORAL_TABLET | Freq: Every evening | ORAL | 3 refills | Status: DC

## 2021-05-07 ENCOUNTER — Emergency Department (HOSPITAL_COMMUNITY): Payer: Medicare Other

## 2021-05-07 ENCOUNTER — Encounter (HOSPITAL_COMMUNITY): Payer: Self-pay | Admitting: Student

## 2021-05-07 ENCOUNTER — Inpatient Hospital Stay (HOSPITAL_COMMUNITY)
Admission: EM | Admit: 2021-05-07 | Discharge: 2021-05-11 | DRG: 291 | Disposition: A | Discharge status: Home / Self Care | Payer: Medicare Other | Attending: Internal Medicine | Admitting: Internal Medicine

## 2021-05-07 DIAGNOSIS — I248 Other forms of acute ischemic heart disease: Secondary | ICD-10-CM | POA: Diagnosis not present

## 2021-05-07 DIAGNOSIS — Z8501 Personal history of malignant neoplasm of esophagus: Secondary | ICD-10-CM | POA: Diagnosis not present

## 2021-05-07 DIAGNOSIS — Z681 Body mass index (BMI) 19 or less, adult: Secondary | ICD-10-CM

## 2021-05-07 DIAGNOSIS — J90 Pleural effusion, not elsewhere classified: Secondary | ICD-10-CM | POA: Diagnosis not present

## 2021-05-07 DIAGNOSIS — I2510 Atherosclerotic heart disease of native coronary artery without angina pectoris: Secondary | ICD-10-CM | POA: Diagnosis not present

## 2021-05-07 DIAGNOSIS — I130 Hypertensive heart and chronic kidney disease with heart failure and stage 1 through stage 4 chronic kidney disease, or unspecified chronic kidney disease: Secondary | ICD-10-CM | POA: Diagnosis not present

## 2021-05-07 DIAGNOSIS — Z743 Need for continuous supervision: Secondary | ICD-10-CM | POA: Diagnosis not present

## 2021-05-07 DIAGNOSIS — I5043 Acute on chronic combined systolic (congestive) and diastolic (congestive) heart failure: Secondary | ICD-10-CM | POA: Diagnosis not present

## 2021-05-07 DIAGNOSIS — H9190 Unspecified hearing loss, unspecified ear: Secondary | ICD-10-CM | POA: Diagnosis not present

## 2021-05-07 DIAGNOSIS — J440 Chronic obstructive pulmonary disease with acute lower respiratory infection: Secondary | ICD-10-CM | POA: Diagnosis present

## 2021-05-07 DIAGNOSIS — Z79899 Other long term (current) drug therapy: Secondary | ICD-10-CM | POA: Diagnosis not present

## 2021-05-07 DIAGNOSIS — I517 Cardiomegaly: Secondary | ICD-10-CM | POA: Diagnosis not present

## 2021-05-07 DIAGNOSIS — I255 Ischemic cardiomyopathy: Secondary | ICD-10-CM | POA: Diagnosis present

## 2021-05-07 DIAGNOSIS — Z803 Family history of malignant neoplasm of breast: Secondary | ICD-10-CM | POA: Diagnosis not present

## 2021-05-07 DIAGNOSIS — I5021 Acute systolic (congestive) heart failure: Secondary | ICD-10-CM | POA: Diagnosis not present

## 2021-05-07 DIAGNOSIS — I160 Hypertensive urgency: Secondary | ICD-10-CM | POA: Diagnosis not present

## 2021-05-07 DIAGNOSIS — E782 Mixed hyperlipidemia: Secondary | ICD-10-CM | POA: Diagnosis not present

## 2021-05-07 DIAGNOSIS — I714 Abdominal aortic aneurysm, without rupture: Secondary | ICD-10-CM | POA: Diagnosis present

## 2021-05-07 DIAGNOSIS — I351 Nonrheumatic aortic (valve) insufficiency: Secondary | ICD-10-CM | POA: Diagnosis not present

## 2021-05-07 DIAGNOSIS — Z923 Personal history of irradiation: Secondary | ICD-10-CM

## 2021-05-07 DIAGNOSIS — R0600 Dyspnea, unspecified: Secondary | ICD-10-CM | POA: Diagnosis not present

## 2021-05-07 DIAGNOSIS — R079 Chest pain, unspecified: Secondary | ICD-10-CM | POA: Diagnosis not present

## 2021-05-07 DIAGNOSIS — Z7982 Long term (current) use of aspirin: Secondary | ICD-10-CM

## 2021-05-07 DIAGNOSIS — J441 Chronic obstructive pulmonary disease with (acute) exacerbation: Secondary | ICD-10-CM | POA: Diagnosis not present

## 2021-05-07 DIAGNOSIS — E871 Hypo-osmolality and hyponatremia: Secondary | ICD-10-CM | POA: Diagnosis not present

## 2021-05-07 DIAGNOSIS — R069 Unspecified abnormalities of breathing: Secondary | ICD-10-CM | POA: Diagnosis not present

## 2021-05-07 DIAGNOSIS — N1831 Chronic kidney disease, stage 3a: Secondary | ICD-10-CM | POA: Diagnosis not present

## 2021-05-07 DIAGNOSIS — Z20822 Contact with and (suspected) exposure to covid-19: Secondary | ICD-10-CM | POA: Diagnosis not present

## 2021-05-07 DIAGNOSIS — K219 Gastro-esophageal reflux disease without esophagitis: Secondary | ICD-10-CM | POA: Diagnosis not present

## 2021-05-07 DIAGNOSIS — E785 Hyperlipidemia, unspecified: Secondary | ICD-10-CM | POA: Diagnosis not present

## 2021-05-07 DIAGNOSIS — R54 Age-related physical debility: Secondary | ICD-10-CM | POA: Diagnosis present

## 2021-05-07 DIAGNOSIS — Z8546 Personal history of malignant neoplasm of prostate: Secondary | ICD-10-CM

## 2021-05-07 DIAGNOSIS — Z9221 Personal history of antineoplastic chemotherapy: Secondary | ICD-10-CM | POA: Diagnosis not present

## 2021-05-07 DIAGNOSIS — T501X5A Adverse effect of loop [high-ceiling] diuretics, initial encounter: Secondary | ICD-10-CM | POA: Diagnosis not present

## 2021-05-07 DIAGNOSIS — I2699 Other pulmonary embolism without acute cor pulmonale: Secondary | ICD-10-CM | POA: Diagnosis not present

## 2021-05-07 DIAGNOSIS — I444 Left anterior fascicular block: Secondary | ICD-10-CM | POA: Diagnosis not present

## 2021-05-07 DIAGNOSIS — R0602 Shortness of breath: Secondary | ICD-10-CM | POA: Diagnosis not present

## 2021-05-07 DIAGNOSIS — R778 Other specified abnormalities of plasma proteins: Secondary | ICD-10-CM | POA: Diagnosis not present

## 2021-05-07 DIAGNOSIS — Z87891 Personal history of nicotine dependence: Secondary | ICD-10-CM

## 2021-05-07 DIAGNOSIS — I10 Essential (primary) hypertension: Secondary | ICD-10-CM | POA: Diagnosis not present

## 2021-05-07 DIAGNOSIS — I5023 Acute on chronic systolic (congestive) heart failure: Secondary | ICD-10-CM | POA: Diagnosis present

## 2021-05-07 DIAGNOSIS — I499 Cardiac arrhythmia, unspecified: Secondary | ICD-10-CM | POA: Diagnosis not present

## 2021-05-07 LAB — TROPONIN I (HIGH SENSITIVITY): Troponin I (High Sensitivity): 57 ng/L — ABNORMAL HIGH (ref <18)

## 2021-05-07 LAB — CBC WITH DIFFERENTIAL/PLATELET
Abs Immature Granulocytes: 0.01 10*3/uL (ref 0.00–0.07)
Basophils Absolute: 0 10*3/uL (ref 0.0–0.1)
Basophils Relative: 1 %
Eosinophils Absolute: 0.1 10*3/uL (ref 0.0–0.5)
Eosinophils Relative: 1 %
HCT: 42.1 % (ref 39.0–52.0)
Hemoglobin: 13.5 g/dL (ref 13.0–17.0)
Immature Granulocytes: 0 %
Lymphocytes Relative: 24 %
Lymphs Abs: 0.9 10*3/uL (ref 0.7–4.0)
MCH: 27.7 pg (ref 26.0–34.0)
MCHC: 32.1 g/dL (ref 30.0–36.0)
MCV: 86.4 fL (ref 80.0–100.0)
Monocytes Absolute: 0.7 10*3/uL (ref 0.1–1.0)
Monocytes Relative: 19 %
Neutro Abs: 2.1 10*3/uL (ref 1.7–7.7)
Neutrophils Relative %: 55 %
Platelets: 193 10*3/uL (ref 150–400)
RBC: 4.87 MIL/uL (ref 4.22–5.81)
RDW: 14.9 % (ref 11.5–15.5)
WBC: 3.8 10*3/uL — ABNORMAL LOW (ref 4.0–10.5)
nRBC: 0 % (ref 0.0–0.2)

## 2021-05-07 LAB — COMPREHENSIVE METABOLIC PANEL
ALT: 16 U/L (ref 0–44)
AST: 31 U/L (ref 15–41)
Albumin: 4 g/dL (ref 3.5–5.0)
Alkaline Phosphatase: 37 U/L — ABNORMAL LOW (ref 38–126)
Anion gap: 10 (ref 5–15)
BUN: 22 mg/dL (ref 8–23)
CO2: 24 mmol/L (ref 22–32)
Calcium: 9.6 mg/dL (ref 8.9–10.3)
Chloride: 104 mmol/L (ref 98–111)
Creatinine, Ser: 1.39 mg/dL — ABNORMAL HIGH (ref 0.61–1.24)
GFR, Estimated: 55 mL/min — ABNORMAL LOW (ref >60)
Glucose, Bld: 107 mg/dL — ABNORMAL HIGH (ref 70–99)
Potassium: 4.7 mmol/L (ref 3.5–5.1)
Sodium: 138 mmol/L (ref 135–145)
Total Bilirubin: 0.6 mg/dL (ref 0.3–1.2)
Total Protein: 7.5 g/dL (ref 6.5–8.1)

## 2021-05-07 LAB — RESP PANEL BY RT-PCR (FLU A&B, COVID) ARPGX2
Influenza A by PCR: NEGATIVE
Influenza B by PCR: NEGATIVE
SARS Coronavirus 2 by RT PCR: NEGATIVE

## 2021-05-07 LAB — BRAIN NATRIURETIC PEPTIDE: B Natriuretic Peptide: 3899.7 pg/mL — ABNORMAL HIGH (ref 0.0–100.0)

## 2021-05-07 MED ORDER — IOHEXOL 350 MG/ML SOLN
80.0000 mL | Freq: Once | INTRAVENOUS | Status: AC | PRN
  Administered 2021-05-07: 80 mL via INTRAVENOUS

## 2021-05-07 MED ORDER — DOXYCYCLINE HYCLATE 100 MG PO TABS
100.0000 mg | ORAL_TABLET | Freq: Once | ORAL | Status: AC
  Administered 2021-05-08: 100 mg via ORAL
  Filled 2021-05-07: qty 1

## 2021-05-07 MED ORDER — IPRATROPIUM-ALBUTEROL 0.5-2.5 (3) MG/3ML IN SOLN
3.0000 mL | Freq: Once | RESPIRATORY_TRACT | Status: AC
  Administered 2021-05-07: 3 mL via RESPIRATORY_TRACT
  Filled 2021-05-07: qty 3

## 2021-05-07 MED ORDER — ALBUTEROL SULFATE HFA 108 (90 BASE) MCG/ACT IN AERS: 4.0000 | INHALATION_SPRAY | Freq: Once | RESPIRATORY_TRACT | Status: DC

## 2021-05-07 MED ORDER — METHYLPREDNISOLONE SODIUM SUCC 125 MG IJ SOLR
125.0000 mg | Freq: Once | INTRAMUSCULAR | Status: AC
  Administered 2021-05-07: 125 mg via INTRAVENOUS
  Filled 2021-05-07: qty 2

## 2021-05-07 MED ORDER — SODIUM CHLORIDE 0.9 % IV SOLN
1.0000 g | Freq: Once | INTRAVENOUS | Status: AC
  Administered 2021-05-08: 1 g via INTRAVENOUS
  Filled 2021-05-07: qty 10

## 2021-05-07 NOTE — ED Triage Notes (Signed)
Shob x 1 week. Took home inhaler with no relief. Pt states he was shob earlier, but feels ok now. Hx of chf, copd, and htn. 100%RA.

## 2021-05-07 NOTE — ED Provider Notes (Signed)
[facility] DEPT Provider Note   CSN: [id number] Arrival date & time: 05/07/21  2203     History Chief Complaint  Patient presents with   Shortness of Breath    Collin Young is a 70 y.o. male with a hx of COPD, CHF with last EF 35-40%, hypercholesterolemia, GERD, hypertension, and prostate & esophageal cancer (residual voice hoarseness) who presents to the ED with complaints of shortness of breath x 1 week. Reports intermittent dyspnea, worse with activity and when trying to get comfortable at night with orthopnea, alleviated some with inhaler use  but tonight tried the inhaler and thought it might be empty due to it not helping much initially therefore he called EMS, upon their arrival breathing had started to improve some. Has had associated cough productive of phlegm sputum, wheezing, and chest tightness/rattling. His abdomen hurts some as well- worse with coughing. Denies fever, diaphoresis, syncope, dizziness, leg pain/swelling, hemoptysis, diarrhea, melena, or dysuria.   HPI     Past Medical History:  Diagnosis Date   Chronic combined systolic and diastolic CHF (congestive heart failure) ([place])    followed by [name]   COPD (chronic obstructive pulmonary disease) ([place])    Coronary artery disease cardiologist-- [name]   per cardiac cath 01-05-2017  chronic total occlusion pLCx with collaterals, 99% severe calcified prox. to mid RCA, otherwise mild to moderate CAD (medically managed)   Esophageal cancer, stage IIIB [facility]) oncologist-  dr ennever/  dr moody   dx 2010  SCC Stage IIIB completed chemoradiation;  localized recurrent left piriform sinus 01/ 2015,  completed concurrent chemoradiatoin 04/ 2015   GERD (gastroesophageal reflux disease)    09-07-2018   no issues since Gtube removed 06/ 2019   Headache    History of alcohol abuse    quit 2001   History of cancer chemotherapy    2010;   2015   History of radiation therapy     10-19-2013 to  12-05-2013 pyriform sinus 69.96 Gy/23f;   Radiation completed 2010 for esophageal cancer   History of seizure 2001   alcohol withdrawal   HOH (hard of hearing)    Hyperlipidemia    Hypertension    Ischemic cardiomyopathy 12/2016   01-03-2017  echo,  ef 10-15%/   echo 05-06-2017 EF improved to 45-50%   Lower urinary tract symptoms (LUTS)    Prostate cancer ([facility] urologist-  dr ottelin/  oncologist-- dr mTammi Klippel  dx 05-27-2018--- Stage T1c,  Gleason 4+3,  PSA 9.3--  scheduled for brachytherapy 09-23-2017   Pure hypercholesterolemia 09/23/2020   Renal cyst, left    Voice hoarseness    secondary to radiation treatment    Patient Active Problem List   Diagnosis Date Noted   Pure hypercholesterolemia 09/23/2020   Claudication in peripheral vascular disease [place] 01/25/2020   Late latent syphilis 01/15/2020   CAD (coronary artery disease) 07/05/2019   CRI (chronic renal insufficiency), stage 3 (moderate) [place] 07/05/2019   Combined systolic and diastolic heart failure ([place] 09/26/2018   Malignant neoplasm of prostate ([place] 06/17/2018   Medication management    Dysphagia    Ischemic cardiomyopathy    Congestive dilated cardiomyopathy ([place]    Malignant ne[place] of [place]iform [place] 09/28/2013   Piriform sinus tumor 09/24/2013   NONSPECIFIC ABN FINDING RAD & OTH EXAM GI TRACT 05/14/2009    Past Surgical History:  Procedure Laterality Date   CARDIOVASCULAR STRESS TEST  10/09/09   normal nuclearr stress test,  EF 57% ([place])   IR GASTROSTOMY TUBE MOD SED  01/13/2017   IR GASTROSTOMY TUBE REMOVAL  02/03/2018   IR PATIENT EVAL TECH 0-60 MINS  03/25/2017   IR REMOVAL TUN ACCESS W/ PORT W/O FL MOD SED  01/15/2017   IR REPLACE G-TUBE SIMPLE WO FLUORO  01/13/2018   LARYNGOSCOPY N/A 09/15/2013   Procedure: LARYNGOSCOPY;  Surgeon: [name], MD;  Location: [facility];  Service: ENT;  Laterality: N/A;  direct laryngoscopy with biopsy and esophagoscopy   RADIOACTIVE SEED IMPLANT  N/A 09/23/2018   Procedure: RADIOACTIVE SEED IMPLANT/BRACHYTHERAPY IMPLANT;  Surgeon: [name], MD;  Location: [place];  Service: Urology;  Laterality: N/A;   RIGHT/LEFT HEART CATH AND CORONARY ANGIOGRAPHY N/A 01/05/2017   Procedure: Right/Left Heart Cath and Coronary Angiography;  Surgeon: [name], MD;  Location: [place] CV LAB;  Service: Cardiovascular;  Laterality: N/A;   TRANSTHORACIC ECHOCARDIOGRAM  05/06/2017   ef 45-50%,  grade 1 diastolic dysfunction/  AV severe calcified non coronary cusp with moderate regurg. , no stenosis (valve area per echo 01-05-2017 1.08cm^2)/  mild MR, TR, and PR/  mild LAE       Family History  Problem Relation Age of Onset   Breast cancer Mother    Prostate cancer Neg Hx    Colon cancer Neg Hx    Pancreatic cancer Neg Hx     Social History   Tobacco Use   Smoking status: Former    Packs/day: 1.00    Years: 40.00    Pack years: 40.00    Types: Cigarettes    Start date: 11/08/1960    Quit date: 08/31/2009    Years since quitting: 11.6   Smokeless tobacco: Former    Types: Chew    Quit date: 09/01/1999  Vaping Use   Vaping Use: Never used  Substance Use Topics   Alcohol use: Not Currently    Alcohol/week: 0.0 standard drinks    Comment: quit in 2001   Drug use: No    Comment: back in the day used cocaine,alcohol, marijuana    Home Medications Prior to Admission medications   Medication Sig Start Date End Date Taking? Authorizing Provider  albuterol (VENTOLIN HFA) 108 (90 Base) MCG/ACT inhaler INHALE 2 PUFFS INTO THE LUNGS EVERY 6 HOURS AS NEEDED FOR WHEEZING OR SHORTNESS OF BREATH 03/28/21   Copland, Gay Filler, MD  aspirin EC 81 MG tablet Take 81 mg by mouth daily.    [provider]  ENTRESTO 24-26 MG TAKE 1 TABLET BY MOUTH TWICE DAILY 11/11/20   Skeet Latch, MD  Evolocumab (REPATHA SURECLICK) [id number] MG/ML SOAJ Inject 140 mg into the skin every 14 (fourteen) days. 11/14/20   Skeet Latch, MD  fentaNYL (DURAGESIC) 12 MCG/HR Place 1 patch onto the skin every 3 (three) days. 03/18/21   [name], MD  metoprolol succinate (TOPROL-XL) 25 MG 24 hr tablet TAKE 1/2 TABLET BY MOUTH DAILY. FOLLOW-UP APPOINTMENT* 02/14/21   Skeet Latch, MD  Multiple Vitamin (MULTIVITAMIN) tablet Take 1 tablet by mouth daily.    [provider]  pravastatin (PRAVACHOL) 80 MG tablet Take 1 tablet (80 mg total) by mouth every evening. 04/30/21 07/29/21  Skeet Latch, MD  Sofosbuvir-Velpatasvir 400-100 MG TABS Take 1 tablet by mouth daily. Patient not taking: Reported on 03/28/2021 12/23/20   [provider]    Allergies    Patient has no known allergies.  Review of Systems   Review of Systems  Constitutional:  Negative for fever.  Respiratory:  Positive for cough, chest tightness, shortness of breath and wheezing.   Cardiovascular:  Negative for leg swelling.  Gastrointestinal:  Positive for abdominal pain. Negative for blood in stool, diarrhea and vomiting.  Genitourinary:  Negative for dysuria.  Neurological:  Negative for syncope.  All other systems reviewed and are negative.  Physical Exam Updated Vital Signs BP (!) 188/91 (BP Location: Right Arm)   Pulse 97   Temp 98.5 F (36.9 C) (Oral)   Resp (!) 26   SpO2 98%   Physical Exam Vitals and nursing note reviewed.  Constitutional:      General: He is not in acute distress.    Appearance: He is well-developed. He is not toxic-appearing.  HENT:     Head: Normocephalic and atraumatic.  Eyes:     General:        Right eye: No discharge.        Left eye: No discharge.     Conjunctiva/sclera: Conjunctivae normal.  Cardiovascular:     Rate and Rhythm: Normal rate and regular rhythm.  Pulmonary:     Effort: Tachypnea present. No respiratory distress.     Breath sounds: Wheezing (expiratory throughout) present. No rhonchi or rales.  Abdominal:     General: There is no distension.     Palpations:  Abdomen is soft.     Tenderness: There is no abdominal tenderness.  Musculoskeletal:     Cervical back: Neck supple.     Right lower leg: No tenderness. No edema.     Left lower leg: No tenderness. No edema.  Skin:    General: Skin is warm and dry.     Findings: No rash.  Neurological:     Mental Status: He is alert.     Comments: Clear speech, voice is hoarse- baseline per aptient.   Psychiatric:        Behavior: Behavior normal.    ED Results / Procedures / Treatments   Labs (all labs ordered are listed, but only abnormal results are displayed) Labs Reviewed  COMPREHENSIVE METABOLIC PANEL - Abnormal; Notable for the following components:      Result Value   Glucose, Bld 107 (*)    Creatinine, Ser 1.39 (*)    Alkaline Phosphatase 37 (*)    GFR, Estimated 55 (*)    All other components within normal limits  CBC WITH DIFFERENTIAL/PLATELET - Abnormal; Notable for the following components:   WBC 3.8 (*)    All other components within normal limits  BRAIN NATRIURETIC PEPTIDE - Abnormal; Notable for the following components:   B Natriuretic Peptide 3,899.7 (*)    All other components within normal limits  URINALYSIS, ROUTINE W REFLEX MICROSCOPIC - Abnormal; Notable for the following components:   APPearance HAZY (*)    Protein, ur 100 (*)    Leukocytes,Ua TRACE (*)    Bacteria, UA RARE (*)    All other components within normal limits  TROPONIN I (HIGH SENSITIVITY) - Abnormal; Notable for the following components:   Troponin I (High Sensitivity) 57 (*)    All other components within normal limits  TROPONIN I (HIGH SENSITIVITY) - Abnormal; Notable for the following components:   Troponin I (High Sensitivity) 50 (*)    All other components within normal limits  RESP PANEL BY RT-PCR (FLU A&B, COVID) [id number]    EKG EKG Interpretation  Date/Time:  Wednesday May 07 2021 22:36:00 EDT Ventricular Rate:  102 PR Interval:  204 QRS Duration:  103 QT Interval:  374 QTC  Calculation: 488 R Axis:   -78 Text Interpretation: Sinus tachycardia Left anterior fascicular block Probable left ventricular hypertrophy Nonspecific T abnormalities, lateral leads Borderline prolonged QT interval Since last tracing rate faster Otherwise no significant change Confirmed by Isla Pence [phone]) on 05/07/2021 10:53:38 PM  Radiology CT Angio Chest PE W/Cm &/Or Wo Cm  Result Date: 05/08/2021 CLINICAL DATA:  Chest pain, shortness of breath, elevated D-dimer EXAM: CT ANGIOGRAPHY CHEST WITH CONTRAST TECHNIQUE: Multidetector CT imaging of the chest was performed using the standard protocol during bolus administration of intravenous contrast. Multiplanar CT image reconstructions and MIPs were obtained to evaluate the vascular anatomy. CONTRAST:  50m OMNIPAQUE IOHEXOL 350 MG/ML SOLN COMPARISON:  01/10/2010 FINDINGS: Cardiovascular: Cardiomegaly. Diffuse coronary artery and aortic calcifications. No evidence of aortic aneurysm. Lack of contrast in the lower lobe pulmonary arteries bilaterally both the common unopacified at a similar level suggesting this may be related to poor cardiac output rather than true pulmonary embolus. Mediastinum/Nodes: No mediastinal, hilar, or axillary adenopathy. Trachea and esophagus are unremarkable. Thyroid unremarkable. Lungs/Pleura: Large right pleural effusion and small left pleural effusion. Bilateral lower lobe airspace opacities could reflect atelectasis or infiltrates/pneumonia. Upper Abdomen: Imaging into the upper abdomen demonstrates no acute findings. Musculoskeletal: Chest wall soft tissues are unremarkable. No acute bony abnormality. Review of the MIP images confirms the above findings. IMPRESSION: Unopacified lower lobe pulmonary arteries bilaterally. Given the symmetric appearance in both lower lobes, this could be related to poor cardiac output and unopacified vessels rather than true pulmonary emboli. Pulmonary emboli however remain in the differential.  Cardiomegaly.  Coronary artery disease. Large right pleural effusion and small left pleural effusion. Bilateral lower lobe atelectasis or infiltrate/pneumonia. Aortic Atherosclerosis (ICD10-I70.0). Electronically Signed   By: KRolm BaptiseM.D.   On: 05/08/2021 00:08   DG Chest Portable 1 View  Result Date: 05/07/2021 CLINICAL DATA:  Dyspnea EXAM: PORTABLE CHEST 1 VIEW COMPARISON:  03/28/2021 FINDINGS: The lungs are symmetrically expanded. Small to moderate right and small left pleural effusions have developed. Mild bilateral perihilar interstitial pulmonary infiltrate has developed, asymmetrically more severe throughout the right lung, most in keeping with asymmetric pulmonary edema, less likely atypical infection. Cardiac size is mildly enlarged, new since prior examination. No pneumothorax. No acute bone abnormality. IMPRESSION: Mild cardiomegaly, new since prior examination, with changes of probable mild cardiogenic failure with perihilar asymmetric interstitial pulmonary edema and bilateral pleural effusions, right greater than left. Electronically Signed   By: AFidela SalisburyM.D.   On: 05/07/2021 23:01    Procedures Procedures   Medications Ordered in ED Medications  ipratropium-albuterol (DUONEB) 0.5-2.5 (3) MG/3ML nebulizer solution 3 mL (has no administration in time range)  methylPREDNISolone sodium succinate (SOLU-MEDROL) 125 mg/2 mL injection 125 mg (has no administration in time range)    ED Course  I have reviewed the triage vital signs and the nursing notes.  Pertinent labs & imaging results that were available during my care of the patient were reviewed by me and considered in my medical decision making (see chart for details).    MDM Rules/Calculators/A&P                           Patient presents to the ED with complaints of dyspnea x 1 week. On arrival hypertensive with tachypnea, expiratory wheeze noted. Plan for duoneb, steroids, and further evaluation at this time.    DDX: Acute COPD exacerbation, acute CHF exacerbation, pneumonia,  COVID-19, critical anemia, PE, atypical ACS.   Additional history obtained:  Additional history obtained from chart review & nursing note review.  Echocardiogram 05/22/2020: EF 35 to 40%.,  Grade 1 diastolic dysfunction.  EKG: Sinus tachycardia Left anterior fascicular block Probable left ventricular hypertrophy Nonspecific T abnormalities, lateral leads Borderline prolonged QT interval Since last tracing rate faster Otherwise no significant change  Imaging Studies ordered:  I ordered imaging studies which included chest x-ray, I independently reviewed, formal radiology impression shows:  Mild cardiomegaly, new since prior examination, with changes of probable mild cardiogenic failure with perihilar asymmetric interstitial pulmonary edema and bilateral pleural effusions, right greater than left.  Chest x-ray findings suggestive of CHF, patient does have a history of this and has been endorsing some orthopnea, he does not have any peripheral edema, will obtain CT angio of the chest for further assessment, will check BNP, and will start antibiotics for pneumonia coverage as this seems clinically more likely at this time per discussion with attending Dr. Gilford Raid who has evaluated the patient a shared visit.   Lab Tests:  I Ordered, reviewed, and interpreted labs, which included:  CBC: mild leukopenia.  CMP: renal function similar to prior.  Troponin: Elevated some, flat with delta.  BNP: Elevated @ 3889.7  CTA: Unopacified lower lobe pulmonary arteries bilaterally. Given the symmetric appearance in both lower lobes, this could be related to poor cardiac output and unopacified vessels rather than true pulmonary emboli. Pulmonary emboli however remain in the differential. Cardiomegaly.  Coronary artery disease. Large right pleural effusion and small left pleural effusion. Bilateral lower lobe atelectasis or infiltrate/pneumonia.  Aortic Atherosclerosis  Suspect patient's dyspnea is multifactorial with COPD, CHF w/ pleural effusions, and possible pneumonia component. Favor CTA findings to be secondary to poor cardiac output, lasix ordered, BP noted to be uptrending 203/113-- will give hydralazine- discussed w/ Dr. Roxanne Mins @ change of shift who is in agreement.  Patient intermittently desaturating into the upper 80s on RA, plan for admission.   02:23: CONSULT: Discussed with hospitalist Dr. Cyd Silence- accepts admission.   I discussed results and plan of care with the patient and his daughter at bedside, they have confirmed understanding and are in agreement.  Portions of this note were generated with [employer]. Dictation errors may occur despite best attempts at proofreading.   Final Clinical Impression(s) / ED Diagnoses Final diagnoses:  Shortness of breath  Pleural effusion, bilateral  COPD exacerbation [facility])    Rx / DC Orders ED Discharge Orders     None        Amaryllis Dyke, PA-C 05/08/21 0524    Isla Pence, MD 05/10/21 1517

## 2021-05-07 NOTE — ED Notes (Signed)
ED Provider at bedside. 

## 2021-05-07 NOTE — ED Notes (Signed)
Patient transported to CT 

## 2021-05-08 ENCOUNTER — Other Ambulatory Visit: Payer: Self-pay

## 2021-05-08 ENCOUNTER — Inpatient Hospital Stay (HOSPITAL_COMMUNITY): Payer: Medicare Other

## 2021-05-08 ENCOUNTER — Encounter (HOSPITAL_COMMUNITY): Payer: Self-pay | Admitting: Internal Medicine

## 2021-05-08 DIAGNOSIS — H9190 Unspecified hearing loss, unspecified ear: Secondary | ICD-10-CM | POA: Diagnosis present

## 2021-05-08 DIAGNOSIS — I2699 Other pulmonary embolism without acute cor pulmonale: Secondary | ICD-10-CM | POA: Diagnosis not present

## 2021-05-08 DIAGNOSIS — K219 Gastro-esophageal reflux disease without esophagitis: Secondary | ICD-10-CM | POA: Diagnosis not present

## 2021-05-08 DIAGNOSIS — Z803 Family history of malignant neoplasm of breast: Secondary | ICD-10-CM | POA: Diagnosis not present

## 2021-05-08 DIAGNOSIS — I517 Cardiomegaly: Secondary | ICD-10-CM | POA: Diagnosis not present

## 2021-05-08 DIAGNOSIS — R778 Other specified abnormalities of plasma proteins: Secondary | ICD-10-CM | POA: Diagnosis present

## 2021-05-08 DIAGNOSIS — I714 Abdominal aortic aneurysm, without rupture: Secondary | ICD-10-CM | POA: Diagnosis present

## 2021-05-08 DIAGNOSIS — J440 Chronic obstructive pulmonary disease with acute lower respiratory infection: Secondary | ICD-10-CM | POA: Diagnosis present

## 2021-05-08 DIAGNOSIS — I444 Left anterior fascicular block: Secondary | ICD-10-CM | POA: Diagnosis present

## 2021-05-08 DIAGNOSIS — J441 Chronic obstructive pulmonary disease with (acute) exacerbation: Secondary | ICD-10-CM | POA: Diagnosis not present

## 2021-05-08 DIAGNOSIS — Z9221 Personal history of antineoplastic chemotherapy: Secondary | ICD-10-CM | POA: Diagnosis not present

## 2021-05-08 DIAGNOSIS — R0602 Shortness of breath: Secondary | ICD-10-CM | POA: Diagnosis not present

## 2021-05-08 DIAGNOSIS — Z79899 Other long term (current) drug therapy: Secondary | ICD-10-CM | POA: Diagnosis not present

## 2021-05-08 DIAGNOSIS — E782 Mixed hyperlipidemia: Secondary | ICD-10-CM

## 2021-05-08 DIAGNOSIS — Z20822 Contact with and (suspected) exposure to covid-19: Secondary | ICD-10-CM | POA: Diagnosis present

## 2021-05-08 DIAGNOSIS — N1831 Chronic kidney disease, stage 3a: Secondary | ICD-10-CM | POA: Diagnosis not present

## 2021-05-08 DIAGNOSIS — R54 Age-related physical debility: Secondary | ICD-10-CM | POA: Diagnosis present

## 2021-05-08 DIAGNOSIS — I5043 Acute on chronic combined systolic (congestive) and diastolic (congestive) heart failure: Secondary | ICD-10-CM | POA: Diagnosis not present

## 2021-05-08 DIAGNOSIS — I10 Essential (primary) hypertension: Secondary | ICD-10-CM | POA: Diagnosis not present

## 2021-05-08 DIAGNOSIS — I5021 Acute systolic (congestive) heart failure: Secondary | ICD-10-CM

## 2021-05-08 DIAGNOSIS — I2510 Atherosclerotic heart disease of native coronary artery without angina pectoris: Secondary | ICD-10-CM | POA: Diagnosis not present

## 2021-05-08 DIAGNOSIS — Z923 Personal history of irradiation: Secondary | ICD-10-CM | POA: Diagnosis not present

## 2021-05-08 DIAGNOSIS — Z681 Body mass index (BMI) 19 or less, adult: Secondary | ICD-10-CM | POA: Diagnosis not present

## 2021-05-08 DIAGNOSIS — R079 Chest pain, unspecified: Secondary | ICD-10-CM | POA: Diagnosis not present

## 2021-05-08 DIAGNOSIS — I248 Other forms of acute ischemic heart disease: Secondary | ICD-10-CM | POA: Diagnosis not present

## 2021-05-08 DIAGNOSIS — I160 Hypertensive urgency: Secondary | ICD-10-CM | POA: Diagnosis not present

## 2021-05-08 DIAGNOSIS — I351 Nonrheumatic aortic (valve) insufficiency: Secondary | ICD-10-CM | POA: Diagnosis not present

## 2021-05-08 DIAGNOSIS — Z8501 Personal history of malignant neoplasm of esophagus: Secondary | ICD-10-CM | POA: Diagnosis not present

## 2021-05-08 DIAGNOSIS — E871 Hypo-osmolality and hyponatremia: Secondary | ICD-10-CM | POA: Diagnosis not present

## 2021-05-08 DIAGNOSIS — J90 Pleural effusion, not elsewhere classified: Secondary | ICD-10-CM | POA: Diagnosis not present

## 2021-05-08 DIAGNOSIS — I130 Hypertensive heart and chronic kidney disease with heart failure and stage 1 through stage 4 chronic kidney disease, or unspecified chronic kidney disease: Secondary | ICD-10-CM | POA: Diagnosis present

## 2021-05-08 DIAGNOSIS — Z8546 Personal history of malignant neoplasm of prostate: Secondary | ICD-10-CM | POA: Diagnosis not present

## 2021-05-08 DIAGNOSIS — E785 Hyperlipidemia, unspecified: Secondary | ICD-10-CM | POA: Diagnosis not present

## 2021-05-08 DIAGNOSIS — T501X5A Adverse effect of loop [high-ceiling] diuretics, initial encounter: Secondary | ICD-10-CM | POA: Diagnosis not present

## 2021-05-08 LAB — CBC WITH DIFFERENTIAL/PLATELET
Abs Immature Granulocytes: 0.02 10*3/uL (ref 0.00–0.07)
Basophils Absolute: 0 10*3/uL (ref 0.0–0.1)
Basophils Relative: 0 %
Eosinophils Absolute: 0 10*3/uL (ref 0.0–0.5)
Eosinophils Relative: 0 %
HCT: 48.4 % (ref 39.0–52.0)
Hemoglobin: 15.3 g/dL (ref 13.0–17.0)
Immature Granulocytes: 1 %
Lymphocytes Relative: 16 %
Lymphs Abs: 0.6 10*3/uL — ABNORMAL LOW (ref 0.7–4.0)
MCH: 27 pg (ref 26.0–34.0)
MCHC: 31.6 g/dL (ref 30.0–36.0)
MCV: 85.5 fL (ref 80.0–100.0)
Monocytes Absolute: 0.4 10*3/uL (ref 0.1–1.0)
Monocytes Relative: 12 %
Neutro Abs: 2.7 10*3/uL (ref 1.7–7.7)
Neutrophils Relative %: 71 %
Platelets: 234 10*3/uL (ref 150–400)
RBC: 5.66 MIL/uL (ref 4.22–5.81)
RDW: 15.1 % (ref 11.5–15.5)
WBC: 3.7 10*3/uL — ABNORMAL LOW (ref 4.0–10.5)
nRBC: 0 % (ref 0.0–0.2)

## 2021-05-08 LAB — URINALYSIS, ROUTINE W REFLEX MICROSCOPIC
Bilirubin Urine: NEGATIVE
Glucose, UA: NEGATIVE mg/dL
Hgb urine dipstick: NEGATIVE
Ketones, ur: NEGATIVE mg/dL
Nitrite: NEGATIVE
Protein, ur: 100 mg/dL — AB
Specific Gravity, Urine: 1.023 (ref 1.005–1.030)
pH: 5 (ref 5.0–8.0)

## 2021-05-08 LAB — COMPREHENSIVE METABOLIC PANEL
ALT: 19 U/L (ref 0–44)
AST: 33 U/L (ref 15–41)
Albumin: 4.4 g/dL (ref 3.5–5.0)
Alkaline Phosphatase: 45 U/L (ref 38–126)
Anion gap: 13 (ref 5–15)
BUN: 23 mg/dL (ref 8–23)
CO2: 27 mmol/L (ref 22–32)
Calcium: 9.8 mg/dL (ref 8.9–10.3)
Chloride: 100 mmol/L (ref 98–111)
Creatinine, Ser: 1.33 mg/dL — ABNORMAL HIGH (ref 0.61–1.24)
GFR, Estimated: 58 mL/min — ABNORMAL LOW (ref >60)
Glucose, Bld: 130 mg/dL — ABNORMAL HIGH (ref 70–99)
Potassium: 3.9 mmol/L (ref 3.5–5.1)
Sodium: 140 mmol/L (ref 135–145)
Total Bilirubin: 0.7 mg/dL (ref 0.3–1.2)
Total Protein: 8.7 g/dL — ABNORMAL HIGH (ref 6.5–8.1)

## 2021-05-08 LAB — TROPONIN I (HIGH SENSITIVITY): Troponin I (High Sensitivity): 50 ng/L — ABNORMAL HIGH (ref <18)

## 2021-05-08 LAB — MAGNESIUM: Magnesium: 2.3 mg/dL (ref 1.7–2.4)

## 2021-05-08 LAB — ECHOCARDIOGRAM COMPLETE
AV Vena cont: 0.4 cm
Calc EF: 24.6 %
MV M vel: 5.26 m/s
MV Peak grad: 110.7 mmHg
P 1/2 time: 291 msec
Radius: 0.3 cm
S' Lateral: 4.5 cm
Single Plane A2C EF: 28.1 %
Single Plane A4C EF: 20.8 %

## 2021-05-08 LAB — HIV ANTIBODY (ROUTINE TESTING W REFLEX): HIV Screen 4th Generation wRfx: NONREACTIVE

## 2021-05-08 MED ORDER — ACETAMINOPHEN 650 MG RE SUPP: 650.0000 mg | Freq: Four times a day (QID) | RECTAL | Status: DC | PRN

## 2021-05-08 MED ORDER — LABETALOL HCL 5 MG/ML IV SOLN: 10.0000 mg | Freq: Once | INTRAVENOUS | Status: DC

## 2021-05-08 MED ORDER — IPRATROPIUM-ALBUTEROL 0.5-2.5 (3) MG/3ML IN SOLN
3.0000 mL | Freq: Once | RESPIRATORY_TRACT | Status: AC
  Administered 2021-05-08: 3 mL via RESPIRATORY_TRACT
  Filled 2021-05-08: qty 3

## 2021-05-08 MED ORDER — ENOXAPARIN SODIUM 40 MG/0.4ML IJ SOSY
40.0000 mg | PREFILLED_SYRINGE | INTRAMUSCULAR | Status: DC
  Administered 2021-05-08 – 2021-05-10 (×3): 40 mg via SUBCUTANEOUS
  Filled 2021-05-08 (×3): qty 0.4

## 2021-05-08 MED ORDER — ONDANSETRON HCL 4 MG/2ML IJ SOLN: 4.0000 mg | Freq: Four times a day (QID) | INTRAMUSCULAR | Status: DC | PRN

## 2021-05-08 MED ORDER — FENTANYL 12 MCG/HR TD PT72
1.0000 | MEDICATED_PATCH | TRANSDERMAL | Status: DC
  Administered 2021-05-08: 1 via TRANSDERMAL
  Filled 2021-05-08: qty 1

## 2021-05-08 MED ORDER — ADULT MULTIVITAMIN W/MINERALS CH
1.0000 | ORAL_TABLET | Freq: Every day | ORAL | Status: DC
  Administered 2021-05-08 – 2021-05-11 (×4): 1 via ORAL
  Filled 2021-05-08 (×4): qty 1

## 2021-05-08 MED ORDER — ONDANSETRON HCL 4 MG PO TABS: 4.0000 mg | ORAL_TABLET | Freq: Four times a day (QID) | ORAL | Status: DC | PRN

## 2021-05-08 MED ORDER — POLYETHYLENE GLYCOL 3350 17 G PO PACK: 17.0000 g | PACK | Freq: Every day | ORAL | Status: DC | PRN

## 2021-05-08 MED ORDER — MELATONIN 5 MG PO TABS: 10.0000 mg | ORAL_TABLET | Freq: Every evening | ORAL | Status: DC | PRN

## 2021-05-08 MED ORDER — PRAVASTATIN SODIUM 40 MG PO TABS
80.0000 mg | ORAL_TABLET | Freq: Every evening | ORAL | Status: DC
  Administered 2021-05-08 – 2021-05-10 (×3): 80 mg via ORAL
  Filled 2021-05-08: qty 2
  Filled 2021-05-08: qty 4
  Filled 2021-05-08: qty 2

## 2021-05-08 MED ORDER — ASPIRIN EC 81 MG PO TBEC
81.0000 mg | DELAYED_RELEASE_TABLET | Freq: Every day | ORAL | Status: DC
  Administered 2021-05-08 – 2021-05-11 (×4): 81 mg via ORAL
  Filled 2021-05-08 (×4): qty 1

## 2021-05-08 MED ORDER — SACUBITRIL-VALSARTAN 24-26 MG PO TABS
1.0000 | ORAL_TABLET | Freq: Two times a day (BID) | ORAL | Status: DC
  Administered 2021-05-08 – 2021-05-11 (×7): 1 via ORAL
  Filled 2021-05-08 (×8): qty 1

## 2021-05-08 MED ORDER — METOPROLOL SUCCINATE ER 25 MG PO TB24
12.5000 mg | ORAL_TABLET | Freq: Every day | ORAL | Status: DC
  Administered 2021-05-08 – 2021-05-09 (×2): 12.5 mg via ORAL
  Filled 2021-05-08: qty 1
  Filled 2021-05-08: qty 0.5

## 2021-05-08 MED ORDER — ALBUTEROL SULFATE (2.5 MG/3ML) 0.083% IN NEBU: 3.0000 mL | INHALATION_SOLUTION | Freq: Four times a day (QID) | RESPIRATORY_TRACT | Status: DC | PRN

## 2021-05-08 MED ORDER — FENTANYL 12 MCG/HR TD PT72: 1.0000 | MEDICATED_PATCH | TRANSDERMAL | Status: DC

## 2021-05-08 MED ORDER — ACETAMINOPHEN 325 MG PO TABS
650.0000 mg | ORAL_TABLET | Freq: Four times a day (QID) | ORAL | Status: DC | PRN
  Administered 2021-05-08 – 2021-05-10 (×2): 650 mg via ORAL
  Filled 2021-05-08 (×2): qty 2

## 2021-05-08 MED ORDER — HYDRALAZINE HCL 20 MG/ML IJ SOLN
10.0000 mg | Freq: Once | INTRAMUSCULAR | Status: AC
  Administered 2021-05-08: 10 mg via INTRAVENOUS
  Filled 2021-05-08: qty 1

## 2021-05-08 MED ORDER — FUROSEMIDE 10 MG/ML IJ SOLN
40.0000 mg | Freq: Two times a day (BID) | INTRAMUSCULAR | Status: DC
  Administered 2021-05-08: 40 mg via INTRAVENOUS
  Filled 2021-05-08: qty 4

## 2021-05-08 MED ORDER — FUROSEMIDE 10 MG/ML IJ SOLN
40.0000 mg | Freq: Once | INTRAMUSCULAR | Status: AC
  Administered 2021-05-08: 40 mg via INTRAVENOUS
  Filled 2021-05-08: qty 4

## 2021-05-08 MED ORDER — HYDRALAZINE HCL 20 MG/ML IJ SOLN: 10.0000 mg | Freq: Four times a day (QID) | INTRAMUSCULAR | Status: DC | PRN

## 2021-05-08 NOTE — ED Notes (Signed)
Provider at the bedside.  

## 2021-05-08 NOTE — H&P (Signed)
History and Physical    Collin Young [id number] DOB: 29-Mar-1951 DOA: 05/07/2021  PCP: Darreld Mclean, MD  Patient coming from: Home   Chief Complaint:  Chief Complaint  Patient presents with   Shortness of Breath     HPI:    70 year old male with past medical history of chronic kidney disease stage IIIa, systolic and diastolic congestive heart failure (Echo 05/2020 EF 35-40% with G1DD), hepatitis C (S/P Tx completed 12/2020), coronary artery disease (100% Lcx lesion, 99% RCA lesion, medical management), gastroesophageal reflux disease, hyperlipidemia, remote history of esophageal cancer status post trach/PEG since removed as well as remote history of prostate cancer and remote history of alcohol abuse who presents to [facility] emergency department with complaints of shortness of breath.  Patient explains that approximately 1 week ago he noticed that he has becoming progressively more short of breath.  Shortness of breath was initially only with exertion but now has occurring with rest as well.  Shortness of breath initially was mild in intensity but is now progressively becoming more and more severe.  Over the span of time patient has noticed associated paroxysmal nocturnal dyspnea and pillow orthopnea.  Patient denies associated peripheral edema or increasing abdominal girth however.  Patient does complain of associated increased cough intermittently productive with white sputum.  Patient additionally admits to an 8 pound weight gain within the past week as well.  Upon further questioning patient denies fevers, sick contacts, recent travel or contact with confirmed COVID-19 infection.  Patient symptoms continue to worsen until he eventually presented to [facility] emergency department for evaluation.  Upon evaluation in the emergency department patient was found to have bilateral pleural effusions with a large right-sided effusion and small left-sided effusion on  chest imaging.  CT angiogram of the chest was performed which did not reveal a pulmonary embolism.  BNP was found to be markedly elevated at 3899.  Troponins were found to be slightly elevated at 57 and 50.  Patient was administered 40 mg of intravenous Lasix for suspected acute congestive heart failure.  Patient was also found to be in hypertensive urgency with blood pressures as high as 203/119 for which 10 mg of intravenous hydralazine was administered.  The hospitalist group was then called to assess the patient for admission to the hospital.  Review of Systems:   Review of Systems  Constitutional:  Positive for malaise/fatigue.  Respiratory:  Positive for cough, sputum production and shortness of breath.   Cardiovascular:  Positive for orthopnea and PND.  All other systems reviewed and are negative.  Past Medical History:  Diagnosis Date   Chronic combined systolic and diastolic CHF (congestive heart failure) ([place])    followed by [name]   COPD (chronic obstructive pulmonary disease) ([place])    Coronary artery disease cardiologist-- [name]   per cardiac cath 01-05-2017  chronic total occlusion pLCx with collaterals, 99% severe calcified prox. to mid RCA, otherwise mild to moderate CAD (medically managed)   Esophageal cancer, stage IIIB [facility]) oncologist-  dr ennever/  dr moody   dx 2010  SCC Stage IIIB completed chemoradiation;  localized recurrent left piriform sinus 01/ 2015,  completed concurrent chemoradiatoin 04/ 2015   GERD (gastroesophageal reflux disease)    09-07-2018   no issues since Gtube removed 06/ 2019   Headache    History of alcohol abuse    quit 2001   History of cancer chemotherapy    2010;   2015  History of radiation therapy    10-19-2013 to  12-05-2013 pyriform sinus 69.96 Gy/64f;   Radiation completed 2010 for esophageal cancer   History of seizure 2001   alcohol withdrawal   HOH (hard of hearing)    Hyperlipidemia    Hypertension     Ischemic cardiomyopathy 12/2016   01-03-2017  echo,  ef 10-15%/   echo 05-06-2017 EF improved to 45-50%   Lower urinary tract symptoms (LUTS)    Prostate cancer ([facility] urologist-  dr ottelin/  oncologist-- dr mTammi Klippel  dx 05-27-2018--- Stage T1c,  Gleason 4+3,  PSA 9.3--  scheduled for brachytherapy 09-23-2017   Pure hypercholesterolemia 09/23/2020   Renal cyst, left    Voice hoarseness    secondary to radiation treatment    Past Surgical History:  Procedure Laterality Date   CARDIOVASCULAR STRESS TEST  10/09/09   normal nuclearr stress test, EF 57% ([place])   IR GASTROSTOMY TUBE MOD SED  01/13/2017   IR GASTROSTOMY TUBE REMOVAL  02/03/2018   IR PATIENT EVAL TECH 0-60 MINS  03/25/2017   IR REMOVAL TUN ACCESS W/ PORT W/O FL MOD SED  01/15/2017   IR REPLACE G-TUBE SIMPLE WO FLUORO  01/13/2018   LARYNGOSCOPY N/A 09/15/2013   Procedure: LARYNGOSCOPY;  Surgeon: DMelida Quitter MD;  [place] [facility]  Service: ENT;  Laterality: N/A;  direct laryngoscopy with biopsy and esophagoscopy   RADIOACTIVE SEED IMPLANT N/A 09/23/2018   Procedure: RADIOACTIVE SEED IMPLANT/BRACHYTHERAPY IMPLANT;  Surgeon: OKathie Rhodes MD;  [place]  Service: Urology;  Laterality: N/A;   RIGHT/LEFT HEART CATH AND CORONARY ANGIOGRAPHY N/A 01/05/2017   Procedure: Right/Left Heart Cath and Coronary Angiography;  Surgeon: MBurnell Blanks MD;  [facility];  Service: Cardiovascular;  Laterality: N/A;   TRANSTHORACIC ECHOCARDIOGRAM  05/06/2017   ef 45-50%,  grade 1 diastolic dysfunction/  AV severe calcified non coronary cusp with moderate regurg. , no stenosis (valve area per echo 01-05-2017 1.08cm^2)/  mild MR, TR, and PR/  mild LAE     reports that he quit smoking about 11 years ago. His smoking use included cigarettes. He started smoking about 60 years ago. He has a 40.00 pack-year smoking history. He quit smokeless tobacco use about 21 years ago.  His smokeless tobacco use included  chew. He reports that he does not currently use alcohol. He reports that he does not use drugs.  No Known Allergies  Family History  Problem Relation Age of Onset   Breast cancer Mother    Prostate cancer Neg Hx    Colon cancer Neg Hx    Pancreatic cancer Neg Hx      Prior to Admission medications   Medication Sig Start Date End Date Taking? Authorizing Provider  albuterol (VENTOLIN HFA) 108 (90 Base) MCG/ACT inhaler INHALE 2 PUFFS INTO THE LUNGS EVERY 6 HOURS AS NEEDED FOR WHEEZING OR SHORTNESS OF BREATH 03/28/21  Yes Copland, JGay Filler MD  aspirin EC 81 MG tablet Take 81 mg by mouth daily.   Yes [provider]  ENTRESTO 24-26 MG TAKE 1 TABLET BY MOUTH TWICE DAILY Patient taking differently: Take 1 tablet by mouth daily. 11/11/20  Yes RSkeet Latch MD  Evolocumab (REPATHA SURECLICK) [id number]/ML SOAJ Inject 140 mg into the skin every 14 (fourteen) days. 11/14/20  Yes RSkeet Latch MD  fentaNYL (DURAGESIC) 12 MCG/HR Place 1 patch onto the skin every 3 (three) days. 03/18/21  Yes EVolanda Napoleon MD  metoprolol succinate (  TOPROL-XL) 25 MG 24 hr tablet TAKE 1/2 TABLET BY MOUTH DAILY. FOLLOW-UP APPOINTMENT* 02/14/21  Yes Skeet Latch, MD  Multiple Vitamin (MULTIVITAMIN) tablet Take 1 tablet by mouth daily.   Yes [provider]  pravastatin (PRAVACHOL) 80 MG tablet Take 1 tablet (80 mg total) by mouth every evening. 04/30/21 07/29/21 Yes Skeet Latch, MD    Physical Exam: Vitals:   05/08/21 0330 05/08/21 0530 05/08/21 0601 05/08/21 0800  BP: (!) 172/81 (!) 182/99 (!) 186/102 (!) 169/94  Pulse: (!) 108 (!) 102 (!) 110 (!) 107  Resp: 20 (!) 22 (!) 22 (!) 21  Temp:    98 F (36.7 C)  TempSrc:    Oral  SpO2: 95% 98% 94% 98%    Constitutional: Awake alert and oriented x3, no associated distress.   Skin: no rashes, no lesions, good skin turgor noted. Eyes: Pupils are equally reactive to light.  No evidence of scleral icterus or conjunctival pallor.   ENMT: Moist mucous membranes noted.  Posterior pharynx clear of any exudate or lesions.   Neck: normal, supple, no masses, no thyromegaly.  Markedly elevated jugular venous pulse at 45 degrees.   Respiratory: Significant bibasilar and mid field rales, worst throughout the right fields but additionally noted in the left lower fields.  No evidence of associated wheezing.  . Normal respiratory effort. No accessory muscle use.  Cardiovascular: Tachycardic rate with regular rhythm, no murmurs / rubs / gallops. No extremity edema. 2+ pedal pulses. No carotid bruits.  Chest:   Nontender without crepitus or deformity.   Back:   Nontender without crepitus or deformity. Abdomen: Abdomen is soft and nontender.  No evidence of intra-abdominal masses.  Positive bowel sounds noted in all quadrants.   Musculoskeletal: No joint deformity upper and lower extremities. Good ROM, no contractures. Normal muscle tone.  Neurologic: CN 2-12 grossly intact. Sensation intact.  Patient moving all 4 extremities spontaneously.  Patient is following all commands.  Patient is responsive to verbal stimuli.   Psychiatric: Patient exhibits normal mood with appropriate affect.  Patient seems to possess insight as to their current situation.     Labs on Admission: I have personally reviewed following labs and imaging studies -   CBC: Recent Labs  Lab 05/07/21 2308  WBC 3.8*  NEUTROABS 2.1  HGB 13.5  HCT 42.1  MCV 86.4  PLT 0000000   Basic Metabolic Panel: Recent Labs  Lab 05/07/21 2308  NA 138  K 4.7  CL 104  CO2 24  GLUCOSE 107*  BUN 22  CREATININE 1.39*  CALCIUM 9.6   GFR: CrCl cannot be calculated (Unknown ideal weight.). Liver Function Tests: Recent Labs  Lab 05/07/21 2308  AST 31  ALT 16  ALKPHOS 37*  BILITOT 0.6  PROT 7.5  ALBUMIN 4.0   No results for input(s): LIPASE, AMYLASE in the last 168 hours. No results for input(s): AMMONIA in the last 168 hours. Coagulation Profile: No results for  input(s): INR, PROTIME in the last 168 hours. Cardiac Enzymes: No results for input(s): CKTOTAL, CKMB, CKMBINDEX, TROPONINI in the last 168 hours. BNP (last 3 results) No results for input(s): PROBNP in the last 8760 hours. HbA1C: No results for input(s): HGBA1C in the last 72 hours. CBG: No results for input(s): GLUCAP in the last 168 hours. Lipid Profile: No results for input(s): CHOL, HDL, LDLCALC, TRIG, CHOLHDL, LDLDIRECT in the last 72 hours. Thyroid Function Tests: No results for input(s): TSH, T4TOTAL, FREET4, T3FREE, THYROIDAB in the last 72 hours.  Anemia Panel: No results for input(s): VITAMINB12, FOLATE, FERRITIN, TIBC, IRON, RETICCTPCT in the last 72 hours. Urine analysis:    Component Value Date/Time   COLORURINE YELLOW 05/07/2021 2345   APPEARANCEUR HAZY (A) 05/07/2021 2345   APPEARANCEUR Clear 05/29/2020 1556   LABSPEC 1.023 05/07/2021 2345   PHURINE 5.0 05/07/2021 2345   GLUCOSEU NEGATIVE 05/07/2021 2345   HGBUR NEGATIVE 05/07/2021 2345   BILIRUBINUR NEGATIVE 05/07/2021 2345   BILIRUBINUR Negative 05/29/2020 1556   KETONESUR NEGATIVE 05/07/2021 2345   PROTEINUR 100 (A) 05/07/2021 2345   UROBILINOGEN 1.0 08/04/2014 2007   NITRITE NEGATIVE 05/07/2021 2345   LEUKOCYTESUR TRACE (A) 05/07/2021 2345    Radiological Exams on Admission - Personally Reviewed: CT Angio Chest PE W/Cm &/Or Wo Cm  Result Date: 05/08/2021 CLINICAL DATA:  Chest pain, shortness of breath, elevated D-dimer EXAM: CT ANGIOGRAPHY CHEST WITH CONTRAST TECHNIQUE: Multidetector CT imaging of the chest was performed using the standard protocol during bolus administration of intravenous contrast. Multiplanar CT image reconstructions and MIPs were obtained to evaluate the vascular anatomy. CONTRAST:  49m OMNIPAQUE IOHEXOL 350 MG/ML SOLN COMPARISON:  01/10/2010 FINDINGS: Cardiovascular: Cardiomegaly. Diffuse coronary artery and aortic calcifications. No evidence of aortic aneurysm. Lack of contrast in the  lower lobe pulmonary arteries bilaterally both the common unopacified at a similar level suggesting this may be related to poor cardiac output rather than true pulmonary embolus. Mediastinum/Nodes: No mediastinal, hilar, or axillary adenopathy. Trachea and esophagus are unremarkable. Thyroid unremarkable. Lungs/Pleura: Large right pleural effusion and small left pleural effusion. Bilateral lower lobe airspace opacities could reflect atelectasis or infiltrates/pneumonia. Upper Abdomen: Imaging into the upper abdomen demonstrates no acute findings. Musculoskeletal: Chest wall soft tissues are unremarkable. No acute bony abnormality. Review of the MIP images confirms the above findings. IMPRESSION: Unopacified lower lobe pulmonary arteries bilaterally. Given the symmetric appearance in both lower lobes, this could be related to poor cardiac output and unopacified vessels rather than true pulmonary emboli. Pulmonary emboli however remain in the differential. Cardiomegaly.  Coronary artery disease. Large right pleural effusion and small left pleural effusion. Bilateral lower lobe atelectasis or infiltrate/pneumonia. Aortic Atherosclerosis (ICD10-I70.0). Electronically Signed   By: KRolm BaptiseM.D.   On: 05/08/2021 00:08   DG Chest Portable 1 View  Result Date: 05/07/2021 CLINICAL DATA:  Dyspnea EXAM: PORTABLE CHEST 1 VIEW COMPARISON:  03/28/2021 FINDINGS: The lungs are symmetrically expanded. Small to moderate right and small left pleural effusions have developed. Mild bilateral perihilar interstitial pulmonary infiltrate has developed, asymmetrically more severe throughout the right lung, most in keeping with asymmetric pulmonary edema, less likely atypical infection. Cardiac size is mildly enlarged, new since prior examination. No pneumothorax. No acute bone abnormality. IMPRESSION: Mild cardiomegaly, new since prior examination, with changes of probable mild cardiogenic failure with perihilar asymmetric  interstitial pulmonary edema and bilateral pleural effusions, right greater than left. Electronically Signed   By: AFidela SalisburyM.D.   On: 05/07/2021 23:01    EKG: Personally reviewed.  Rhythm is sinus tachycardia with heart rate of 102 bpm.  Evidence of left anterior fascicular block, QTC 488.  No dynamic ST segment changes appreciated.  Assessment/Plan Principal Problem:   Acute on chronic combined systolic and diastolic CHF (congestive heart failure) ([place]  Patient presenting with progressively worsening dyspnea on exertion paroxysmal nocturnal dyspnea and pillow orthopnea with evidence of markedly elevated BNP and bilateral pleural effusions on chest x-ray and CT imaging all suggestive of acute congestive heart failure. Initially managing with a combination of Lasix  40 mg IV twice daily as well as reinitiation of home antihypertensives and titration of the antihypertensives to achieve improved BP control If this does not rapidly result in symptomatic improvement in the next 24 hours or so then will consider therapeutic thoracentesis of the larger pleural effusion. Strict input and output monitoring Monitoring renal function and electrolytes with serial chemistries Daily weights Obtaining echocardiogram  Active Problems:   Hypertensive urgency  Patient presenting with markedly elevated blood pressures as high as 203/119 in the emergency department Patient reports compliance with his Activa tensive regimen however making this a surprising finding Will start by reinitiating patient's home regimen of metoprolol and Entresto Will additionally provide patient with as needed intravenous antihypertensives Titrating antihypertensive regimen to achieve excellent BP control as uncontrolled blood pressures may be precipitating patient's volume overload    Coronary artery disease involving native coronary artery of native heart  Known history of advanced coronary disease Patient is currently  chest pain-free Monitoring patient on telemetry Minimally elevated troponins on presentation, unlikely to be secondary to plaque rupture Continuing home regimen of statin therapy, beta-blocker therapy, aspirin    Chronic kidney disease, stage 3a (HCC)  Strict intake and output monitoring Creatinine near baseline Minimizing nephrotoxic agents as much as possible Serial chemistries to monitor renal function and electrolytes    GERD without esophagitis  Continuing home regimen of daily PPI therapy.    Mixed hyperlipidemia  Continuing home regimen of lipid lowering therapy.    Elevated troponin level not due myocardial infarction  Patient currently chest pain-free Serial troponins are 57 and 50 with this flat trajectory of elevation plaque rupture is unlikely Possibly elevated secondary to supply demand mismatch in the setting of acute congestive heart failure Monitoring patient on telemetry  Code Status:  Full code  code status decision has been confirmed with: Patient Family Communication: deferred   Status is: Inpatient  Remains inpatient appropriate because:Ongoing diagnostic testing needed not appropriate for outpatient work up, IV treatments appropriate due to intensity of illness or inability to take PO, and Inpatient level of care appropriate due to severity of illness  Dispo: The patient is from: Home              Anticipated d/c is to: Home              Patient currently is not medically stable to d/c.   Difficult to place patient No        Vernelle Emerald MD [facility] Pager [phone]  If 7PM-7AM, please contact night-coverage [email or web address] Use universal [place] password for that web site. If you do not have the password, please call the hospital operator.  05/08/2021, 9:45 AM

## 2021-05-08 NOTE — ED Notes (Signed)
Pt attached to cardiac monitor x3. A&O x4. Pt hypertensive at 166/102. VS are otherwise stable.

## 2021-05-08 NOTE — Progress Notes (Signed)
70 year old male admitted with acute systolic CHF exacerbation. Echo ejection fraction 25 to 30% which is down from his prior echo a year ago, global hypokinesis.  Continue Lasix, Entresto, beta-blocker. Will consult cardiology in AM. Patient seen in the ED resting in bed complaining of dyspnea on exertion.  Chart reviewed labs reviewed

## 2021-05-08 NOTE — Progress Notes (Signed)
  Echocardiogram 2D Echocardiogram has been performed.  Fidel Levy 05/08/2021, 12:02 PM

## 2021-05-08 NOTE — ED Notes (Signed)
Patient returned from CT

## 2021-05-08 NOTE — ED Notes (Signed)
Pharmacy contacted regarding verifying pt's inpatient meds.

## 2021-05-08 NOTE — ED Notes (Signed)
Echo at the bedside °

## 2021-05-08 NOTE — Plan of Care (Signed)
?  Problem: Education: ?Goal: Ability to demonstrate management of disease process will improve ?Outcome: Progressing ?  ?Problem: Activity: ?Goal: Capacity to carry out activities will improve ?Outcome: Progressing ?  ?Problem: Cardiac: ?Goal: Ability to achieve and maintain adequate cardiopulmonary perfusion will improve ?Outcome: Progressing ?  ?

## 2021-05-09 DIAGNOSIS — I351 Nonrheumatic aortic (valve) insufficiency: Secondary | ICD-10-CM

## 2021-05-09 DIAGNOSIS — I5043 Acute on chronic combined systolic (congestive) and diastolic (congestive) heart failure: Secondary | ICD-10-CM

## 2021-05-09 DIAGNOSIS — I2510 Atherosclerotic heart disease of native coronary artery without angina pectoris: Secondary | ICD-10-CM

## 2021-05-09 DIAGNOSIS — I10 Essential (primary) hypertension: Secondary | ICD-10-CM

## 2021-05-09 DIAGNOSIS — N1831 Chronic kidney disease, stage 3a: Secondary | ICD-10-CM

## 2021-05-09 DIAGNOSIS — E785 Hyperlipidemia, unspecified: Secondary | ICD-10-CM

## 2021-05-09 LAB — BASIC METABOLIC PANEL
Anion gap: 9 (ref 5–15)
BUN: 29 mg/dL — ABNORMAL HIGH (ref 8–23)
CO2: 28 mmol/L (ref 22–32)
Calcium: 8.9 mg/dL (ref 8.9–10.3)
Chloride: 96 mmol/L — ABNORMAL LOW (ref 98–111)
Creatinine, Ser: 1.36 mg/dL — ABNORMAL HIGH (ref 0.61–1.24)
GFR, Estimated: 56 mL/min — ABNORMAL LOW (ref >60)
Glucose, Bld: 102 mg/dL — ABNORMAL HIGH (ref 70–99)
Potassium: 3.9 mmol/L (ref 3.5–5.1)
Sodium: 133 mmol/L — ABNORMAL LOW (ref 135–145)

## 2021-05-09 MED ORDER — FUROSEMIDE 10 MG/ML IJ SOLN
80.0000 mg | Freq: Two times a day (BID) | INTRAMUSCULAR | Status: DC
  Administered 2021-05-09: 80 mg via INTRAVENOUS
  Filled 2021-05-09: qty 8

## 2021-05-09 MED ORDER — METOPROLOL SUCCINATE ER 25 MG PO TB24
25.0000 mg | ORAL_TABLET | Freq: Every day | ORAL | Status: DC
  Administered 2021-05-10 – 2021-05-11 (×2): 25 mg via ORAL
  Filled 2021-05-09 (×2): qty 1

## 2021-05-09 NOTE — Plan of Care (Signed)
  Problem: Education: Goal: Ability to demonstrate management of disease process will improve 05/09/2021 0747 by Marcella Dubs, RN Outcome: Progressing 05/08/2021 1751 by Marcella Dubs, RN Outcome: Progressing   Problem: Activity: Goal: Capacity to carry out activities will improve 05/09/2021 0747 by Marcella Dubs, RN Outcome: Progressing 05/08/2021 1751 by Marcella Dubs, RN Outcome: Progressing   Problem: Cardiac: Goal: Ability to achieve and maintain adequate cardiopulmonary perfusion will improve 05/09/2021 0747 by Marcella Dubs, RN Outcome: Progressing 05/08/2021 1751 by Marcella Dubs, RN Outcome: Progressing

## 2021-05-09 NOTE — Plan of Care (Signed)
  Problem: Education: Goal: Ability to demonstrate management of disease process will improve 05/09/2021 0017 by Theotis Barrio, RN Outcome: Progressing 05/09/2021 0017 by Theotis Barrio, RN Outcome: Progressing Goal: Ability to verbalize understanding of medication therapies will improve Outcome: Progressing Goal: Individualized Educational Video(s) Outcome: Progressing   Problem: Activity: Goal: Capacity to carry out activities will improve Outcome: Progressing   Problem: Cardiac: Goal: Ability to achieve and maintain adequate cardiopulmonary perfusion will improve Outcome: Progressing

## 2021-05-09 NOTE — Progress Notes (Signed)
   05/09/21 1213  Mobility  Activity Ambulated in hall  Range of Motion/Exercises Active;All extremities  Level of Assistance Standby assist, set-up cues, supervision of patient - no hands on  Assistive Device None  Distance Ambulated (ft) 500 ft  Mobility Ambulated with assistance in hallway  Mobility Response Tolerated well  Mobility performed by Mobility specialist  $Mobility charge 1 Mobility    Pt eager to ambulate today. Upon entering, pt ambulated to bathroom independently. Ambulated about 513f in hallway with no device, tolerated well. He stated he feels "better than before". Left pt in chair for lunch, with call bell at side. Notified RN of session.   KBarringtonSpecialist Acute Rehab Services Office: [phone]

## 2021-05-09 NOTE — Consult Note (Signed)
Cardiology Consultation:   Patient ID: METIN OSWALD MRN: [id number]; DOB: 1951-02-23  Admit date: 05/07/2021 Date of Consult: 05/09/2021  PCP:  Darreld Mclean, MD   [facility] Providers Cardiologist:  Skeet Latch, MD  Patient Profile:   DANTWAN HOSBACH is a 70 y.o. 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 Rodena Piety.  History of Present Illness:   Mr. Bicknese was last seen by cardiology at an outpatient visit with Dr. Oval Linsey 09/23/2020 at which time he was doing fairly well from a cardiac standpoint.  He had no volume overload complaints or anginal complaints.  No medication changes occurred at that visit he was recommended to follow-up in 6 months.  His cardiomyopathy history dates back to 2018 when he presented to the hospital with shortness of breath and was found to have EF 10-15% on echo.  Heart catheterization at that time revealed 100% proximal LCx with left > left collaterals, 99% proximal-mid RCA, and otherwise mild-moderate CAD.  He was considered for RCA PCI though this was deferred due to his frailty and comorbidities and anticipated need for hemodynamic support.  He required milrinone support and IV Lasix that admission.  Subsequent echo in 2018 showed improvement in LV function with EF 45-50%.  He has been on metoprolol succinate and Entresto for GDMT.  His most recent echo prior to this admission was 05/2020 showing EF 35 to 40%, G1DD, severe hypokinesis of the LV, basal-mid inferoseptal wall, inferior wall and inferolateral wall, mild LAE, and mild to moderate AR.  Lives at below he presented to [facility] 05/07/2021 with complaints of progressive shortness of breath for the past week.  Initially  only occurring with exertion but has progressed to occurring at rest as well.  He also reports PND, orthopnea, and weight gain.  He had no complaints of chest pain or lower extremity edema.  He was markedly hypertensive on arrival, mildly tachycardic, intermittently tachypneic, otherwise VSS.  Labs notable for electrolytes WNL, creatinine 1.3 (baseline), hemoglobin 13.5, platelet 193, HsTrop 57> 50, BNP 3900.  EKG showed sinus tachycardia, rate 102 bpm, LAFB, nonspecific T wave abnormality, LVH with repol abnormality no ST E/D.  CXR showed mild cardiomegaly, interstitial pulmonary edema and bilateral pleural effusions R>L.  CT PE showed unopacified lower lobe pulmonary arteries bilaterally possibly related to poor cardiac output and unopacified vessels rather than true PE, large right pleural effusion and small left pleural effusion.  He was admitted to medicine for IV diuresis.  Echocardiogram showed further decline in EF to 25-30%, mild LVH, indeterminate LV diastolic function, moderate MR (appears functional), and moderate AR.  I & O's are incomplete, weight is down from 128 pounds to 125 pounds.  Cardiology asked to evaluate for acute on chronic combined CHF.  Post IV lasix patient feels great.  Patient notes that he is feeling back to himself but has not gotten UOB yet.  Has had no chest pain, chest pressure, chest tightness, chest stinging (didn't have in 2018 with CAD eval either).   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  No shortness of breath presently, still awaiting to get up today.  No PND or orthopnea.  No leg  swelling , or abdominal swelling.  No syncope or near syncope . Notes  no palpitations or funny heart beats.   No leg claudication.   Discussed with daughter [name]- she notes that outside of the last few days he has been working without any issues.  No new CP, no new dietary changes.     Past Medical History:  Diagnosis  Date   Chronic combined systolic and diastolic CHF (congestive heart failure) ([place])    followed by [name]   COPD (chronic obstructive pulmonary disease) ([place])    Coronary artery disease cardiologist-- [name]   per cardiac cath 01-05-2017  chronic total occlusion pLCx with collaterals, 99% severe calcified prox. to mid RCA, otherwise mild to moderate CAD (medically managed)   Esophageal cancer, stage IIIB [facility]) oncologist-  dr ennever/  dr moody   dx 2010  SCC Stage IIIB completed chemoradiation;  localized recurrent left piriform sinus 01/ 2015,  completed concurrent chemoradiatoin 04/ 2015   GERD (gastroesophageal reflux disease)    09-07-2018   no issues since Gtube removed 06/ 2019   Headache    History of alcohol abuse    quit 2001   History of cancer chemotherapy    2010;   2015   History of radiation therapy    10-19-2013 to  12-05-2013 pyriform sinus 69.96 Gy/48f;   Radiation completed 2010 for esophageal cancer   History of seizure 2001   alcohol withdrawal   HOH (hard of hearing)    Hyperlipidemia    Hypertension    Ischemic cardiomyopathy 12/2016   01-03-2017  echo,  ef 10-15%/   echo 05-06-2017 EF improved to 45-50%   Lower urinary tract symptoms (LUTS)    Prostate cancer ([facility] urologist-  dr ottelin/  oncologist-- dr mTammi Klippel  dx 05-27-2018--- Stage T1c,  Gleason 4+3,  PSA 9.3--  scheduled for brachytherapy 09-23-2017   Pure hypercholesterolemia 09/23/2020   Renal cyst, left    Voice hoarseness    secondary to radiation treatment    Past Surgical History:  Procedure Laterality Date   CARDIOVASCULAR STRESS TEST  10/09/09   normal nuclearr stress test, EF 57% ([place])   IR GASTROSTOMY TUBE MOD SED  01/13/2017   IR GASTROSTOMY TUBE REMOVAL  02/03/2018   IR PATIENT EVAL TECH 0-60 MINS  03/25/2017   IR REMOVAL TUN ACCESS W/ PORT W/O FL MOD SED  01/15/2017   IR REPLACE G-TUBE SIMPLE WO FLUORO  01/13/2018   LARYNGOSCOPY N/A 09/15/2013   Procedure:  LARYNGOSCOPY;  Surgeon: DMelida Quitter MD;  [place]  Service: ENT;  Laterality: N/A;  direct laryngoscopy with biopsy and esophagoscopy   RADIOACTIVE SEED IMPLANT N/A 09/23/2018   Procedure: RADIOACTIVE SEED IMPLANT/BRACHYTHERAPY IMPLANT;  Surgeon: OKathie Rhodes MD;  [place]  Service: Urology;  Laterality: N/A;   RIGHT/LEFT HEART CATH AND CORONARY ANGIOGRAPHY N/A 01/05/2017   Procedure: Right/Left Heart Cath and Coronary Angiography;  Surgeon: MBurnell Blanks MD;  [place];  Service: Cardiovascular;  Laterality: N/A;   TRANSTHORACIC ECHOCARDIOGRAM  05/06/2017   ef 45-50%,  grade 1 diastolic dysfunction/  AV severe calcified non coronary cusp with moderate regurg. , no stenosis (valve area per echo 01-05-2017 1.08cm^2)/  mild MR, TR, and PR/  mild LAE     Home Medications:  Prior to Admission medications   Medication Sig Start Date End Date Taking? Authorizing Provider  albuterol (VENTOLIN HFA) 108 (90 Base) MCG/ACT inhaler INHALE  2 PUFFS INTO THE LUNGS EVERY 6 HOURS AS NEEDED FOR WHEEZING OR SHORTNESS OF BREATH 03/28/21  Yes Copland, Gay Filler, MD  aspirin EC 81 MG tablet Take 81 mg by mouth daily.   Yes [provider]  ENTRESTO 24-26 MG TAKE 1 TABLET BY MOUTH TWICE DAILY Patient taking differently: Take 1 tablet by mouth daily. 11/11/20  Yes Skeet Latch, MD  Evolocumab (REPATHA SURECLICK) [id number] MG/ML SOAJ Inject 140 mg into the skin every 14 (fourteen) days. 11/14/20  Yes Skeet Latch, MD  fentaNYL (DURAGESIC) 12 MCG/HR Place 1 patch onto the skin every 3 (three) days. 03/18/21  Yes Ennever, Rudell Cobb, MD  metoprolol succinate (TOPROL-XL) 25 MG 24 hr tablet TAKE 1/2 TABLET BY MOUTH DAILY. FOLLOW-UP APPOINTMENT* 02/14/21  Yes Skeet Latch, MD  Multiple Vitamin (MULTIVITAMIN) tablet Take 1 tablet by mouth daily.   Yes [provider]  pravastatin (PRAVACHOL) 80 MG tablet Take 1 tablet (80 mg total) by mouth  every evening. 04/30/21 07/29/21 Yes Skeet Latch, MD    Inpatient Medications: Scheduled Meds:  aspirin EC  81 mg Oral Daily   enoxaparin (LOVENOX) injection  40 mg Subcutaneous Q24H   fentaNYL  1 patch Transdermal Q72H   furosemide  80 mg Intravenous BID   metoprolol succinate  12.5 mg Oral Daily   multivitamin with minerals  1 tablet Oral Daily   pravastatin  80 mg Oral QPM   sacubitril-valsartan  1 tablet Oral BID   Continuous Infusions:  PRN Meds: acetaminophen **OR** acetaminophen, albuterol, hydrALAZINE, melatonin, ondansetron **OR** ondansetron (ZOFRAN) IV, polyethylene glycol  Allergies:   No Known Allergies  Social History:   Social History   Socioeconomic History   Marital status: Legally Separated    Spouse name: Not on file   Number of children: 2   Years of education: Not on file   Highest education level: Not on file  Occupational History   Occupation: retired  Tobacco Use   Smoking status: Former    Packs/day: 1.00    Years: 40.00    Pack years: 40.00    Types: Cigarettes    Start date: 11/08/1960    Quit date: 08/31/2009    Years since quitting: 11.6   Smokeless tobacco: Former    Types: Chew    Quit date: 09/01/1999  Vaping Use   Vaping Use: Never used  Substance and Sexual Activity   Alcohol use: Not Currently    Alcohol/week: 0.0 standard drinks    Comment: quit in 2001   Drug use: No    Comment: back in the day used cocaine,alcohol, marijuana   Sexual activity: Yes    Partners: Female    Birth control/protection: None  Other Topics Concern   Not on file  Social History Narrative   Not on file   Social Determinants of Health   Financial Resource Strain: Not on file  Food Insecurity: Not on file  Transportation Needs: Not on file  Physical Activity: Not on file  Stress: Not on file  Social Connections: Not on file  Intimate Partner Violence: Not on file    Family History:    Family History  Problem Relation Age of Onset    Breast cancer Mother    Prostate cancer Neg Hx    Colon cancer Neg Hx    Pancreatic cancer Neg Hx      ROS:  Please see the history of present illness.   All other ROS reviewed and negative.     Physical  Exam/Data:   Vitals:   05/08/21 1953 05/09/21 0106 05/09/21 0442 05/09/21 0847  BP: (!) 151/90 139/90 137/83 114/63  Pulse:  96 95 94  Resp:  '16 18 16  '$ Temp:  97.7 F (36.5 C) (!) 97.4 F (36.3 C) (!) 97.5 F (36.4 C)  TempSrc:  Oral Oral Oral  SpO2:  100% 93% 96%  Weight:   57 kg   Height:        Intake/Output Summary (Last 24 hours) at 05/09/2021 0852 Last data filed at 05/09/2021 0000 Gross per 24 hour  Intake --  Output 475 ml  Net -475 ml   Last 3 Weights 05/09/2021 05/08/2021 03/28/2021  Weight (lbs) 125 lb 10.6 oz 128 lb 14.4 oz 135 lb 12.8 oz  Weight (kg) 57 kg 58.469 kg 61.598 kg     Body mass index is 20.28 kg/m.  General:  Well nourished, well developed, in no acute distress HEENT: normal Lymph: no adenopathy Neck: no JVD, neck scar noted Endocrine:  No thryomegaly Vascular: No carotid bruits; FA pulses 2+ bilaterally without bruits  Cardiac:  normal S1, S2; RRR; diastolic rumble noted Lungs:  clear to auscultation bilaterally, no wheezing, rhonchi or rales  Abd: soft, nontender, no hepatomegaly  Ext: no edema Musculoskeletal:  No deformities, BUE and BLE strength normal and equal Skin: warm and dry  Neuro:  CNs 2-12 intact, no focal abnormalities noted Psych:  Normal affect   EKG:  The EKG was personally reviewed and demonstrates:  sinus tachycardia rate 102 with anterior infarct pattern (borderline) and LVH Telemetry:  Telemetry was personally reviewed and demonstrates:  Sinus tachy-> SR  Relevant CV Studies: Echocardiogram 05/08/21: 1. Left ventricular ejection fraction, by estimation, is 25 to 30%. The  left ventricle has severely decreased function. The left ventricle  demonstrates global hypokinesis. There is mild left ventricular  hypertrophy.  Left ventricular diastolic parameters   are indeterminate.   2. Right ventricular systolic function was not well visualized. The right  ventricular size is not well visualized. There is normal pulmonary artery  systolic pressure. The estimated right ventricular systolic pressure is  99991111 mmHg.   3. The mitral valve is abnormal. Moderate mitral valve regurgitation.  Appears functional.   4. The aortic valve is tricuspid. Aortic valve regurgitation is moderate.  Mild aortic valve sclerosis is present, with no evidence of aortic valve  stenosis.   5. The inferior vena cava is normal in size with greater than 50%  respiratory variability, suggesting right atrial pressure of 3 mmHg.   Echo 05/29/20:  1. Left ventricular ejection fraction, by estimation, is 35 to 40%. The  left ventricle has moderately decreased function. The left ventricle  demonstrates regional wall motion abnormalities (see scoring  diagram/findings for description). Left ventricular   diastolic parameters are consistent with Grade I diastolic dysfunction  (impaired relaxation). There is severe hypokinesis of the left  ventricular, basal-mid inferoseptal wall, inferior wall and inferolateral  wall.   2. Right ventricular systolic function is normal. The right ventricular  size is normal. There is mildly elevated pulmonary artery systolic  pressure.   3. Left atrial size was mildly dilated.   4. The mitral valve is normal in structure. Trivial mitral valve  regurgitation.   5. The aortic valve is tricuspid. There is mild calcification of the  aortic valve. There is mild thickening of the aortic valve. Aortic valve  regurgitation is mild to moderate. Mild aortic valve sclerosis is present,  with no evidence of  aortic valve  stenosis.    Echo 05/06/17: Study Conclusions   - Left ventricle: Diffuse hypokinesis slightly worse in inferior   wall. The cavity size was mildly dilated. Systolic function was   mildly reduced.  The estimated ejection fraction was in the range   of 45% to 50%. Doppler parameters are consistent with abnormal   left ventricular relaxation (grade 1 diastolic dysfunction). - Aortic valve: Severely calcified non coronary cusp. There was   moderate regurgitation. - Mitral valve: There was mild regurgitation. - Left atrium: The atrium was mildly dilated. - Atrial septum: No defect or patent foramen ovale was identified.   Echo 01/03/17: Study Conclusions   - Left ventricle: The cavity size was normal. Wall thickness was   normal. The estimated ejection fraction was in the range of 10%   to 15%. Diffuse hypokinesis. DIastolic function is abnormal,   indeterminant grade. - Aortic valve: There was moderate regurgitation. Valve area (VTI):   1.08 cm^2. Valve area (Vmax): 1.5 cm^2. Valve area (Vmean): 1.5   cm^2. - Mitral valve: There was moderate regurgitation. - Left atrium: The atrium was mildly dilated. - Right ventricle: Systolic function was mildly to moderately   reduced. - Tricuspid valve: There was moderate regurgitation. - Pulmonary arteries: Systolic pressure was moderately increased.   PA peak pressure: 49 mm Hg (S).   LHC 01/05/17: Prox RCA to Mid RCA lesion, 99 %stenosed. Mid RCA lesion, 30 %stenosed. Dist RCA lesion, 40 %stenosed. RPDA lesion, 20 %stenosed. Prox Cx lesion, 100 %stenosed. Ost Ramus to Ramus lesion, 50 %stenosed. Ost 3rd Diag to 3rd Diag lesion, 30 %stenosed. Mid LAD lesion, 40 %stenosed. Ost LAD to Prox LAD lesion, 20 %stenosed. Ost LM to LM lesion, 40 %stenosed. Hemodynamic findings consistent with mild pulmonary hypertension.   1. Severe calcific disease in the proximal to mid RCA 2. 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 Moderate left main and mid LAD stenosis 4. Elevated filling pressures   Laboratory Data:  High Sensitivity Troponin:   Recent Labs  Lab 05/07/21 2308 05/08/21 0104  TROPONINIHS 57* 50*      Chemistry Recent Labs  Lab 05/07/21 2308 05/08/21 1254 05/09/21 0748  NA 138 140 133*  K 4.7 3.9 3.9  CL 104 100 96*  CO2 '24 27 28  '$ GLUCOSE 107* 130* 102*  BUN 22 23 29*  CREATININE 1.39* 1.33* 1.36*  CALCIUM 9.6 9.8 8.9  GFRNONAA 55* 58* 56*  ANIONGAP '10 13 9    '$ Recent Labs  Lab 05/07/21 2308 05/08/21 1254  PROT 7.5 8.7*  ALBUMIN 4.0 4.4  AST 31 33  ALT 16 19  ALKPHOS 37* 45  BILITOT 0.6 0.7   Hematology Recent Labs  Lab 05/07/21 2308 05/08/21 1254  WBC 3.8* 3.7*  RBC 4.87 5.66  HGB 13.5 15.3  HCT 42.1 48.4  MCV 86.4 85.5  MCH 27.7 27.0  MCHC 32.1 31.6  RDW 14.9 15.1  PLT 193 234   BNP Recent Labs  Lab 05/07/21 2311  BNP 3,899.7*    DDimer No results for input(s): DDIMER in the last 168 hours.   Radiology/Studies:  CT Angio Chest PE W/Cm &/Or Wo Cm  Result Date: 05/08/2021 CLINICAL DATA:  Chest pain, shortness of breath, elevated D-dimer EXAM: CT ANGIOGRAPHY CHEST WITH CONTRAST TECHNIQUE: Multidetector CT imaging of the chest was performed using the standard protocol during bolus administration of intravenous contrast. Multiplanar CT image reconstructions and MIPs were obtained to evaluate the vascular anatomy. CONTRAST:  65m  OMNIPAQUE IOHEXOL 350 MG/ML SOLN COMPARISON:  01/10/2010 FINDINGS: Cardiovascular: Cardiomegaly. Diffuse coronary artery and aortic calcifications. No evidence of aortic aneurysm. Lack of contrast in the lower lobe pulmonary arteries bilaterally both the common unopacified at a similar level suggesting this may be related to poor cardiac output rather than true pulmonary embolus. Mediastinum/Nodes: No mediastinal, hilar, or axillary adenopathy. Trachea and esophagus are unremarkable. Thyroid unremarkable. Lungs/Pleura: Large right pleural effusion and small left pleural effusion. Bilateral lower lobe airspace opacities could reflect atelectasis or infiltrates/pneumonia. Upper Abdomen: Imaging into the upper abdomen demonstrates no acute  findings. Musculoskeletal: Chest wall soft tissues are unremarkable. No acute bony abnormality. Review of the MIP images confirms the above findings. IMPRESSION: Unopacified lower lobe pulmonary arteries bilaterally. Given the symmetric appearance in both lower lobes, this could be related to poor cardiac output and unopacified vessels rather than true pulmonary emboli. Pulmonary emboli however remain in the differential. Cardiomegaly.  Coronary artery disease. Large right pleural effusion and small left pleural effusion. Bilateral lower lobe atelectasis or infiltrate/pneumonia. Aortic Atherosclerosis (ICD10-I70.0). Electronically Signed   By: Rolm Baptise M.D.   On: 05/08/2021 00:08   DG Chest Portable 1 View  Result Date: 05/07/2021 CLINICAL DATA:  Dyspnea EXAM: PORTABLE CHEST 1 VIEW COMPARISON:  03/28/2021 FINDINGS: The lungs are symmetrically expanded. Small to moderate right and small left pleural effusions have developed. Mild bilateral perihilar interstitial pulmonary infiltrate has developed, asymmetrically more severe throughout the right lung, most in keeping with asymmetric pulmonary edema, less likely atypical infection. Cardiac size is mildly enlarged, new since prior examination. No pneumothorax. No acute bone abnormality. IMPRESSION: Mild cardiomegaly, new since prior examination, with changes of probable mild cardiogenic failure with perihilar asymmetric interstitial pulmonary edema and bilateral pleural effusions, right greater than left. Electronically Signed   By: Fidela Salisbury M.D.   On: 05/07/2021 23:01   ECHOCARDIOGRAM COMPLETE  Result Date: 05/08/2021    ECHOCARDIOGRAM REPORT   Patient Name:   SHANDY PORRES Date of Exam: 05/08/2021 Medical Rec #:  [id number]        Height:       66.0 in Accession #:    [id number]       Weight:       135.8 lb Date of Birth:  12/07/1950        BSA:          1.696 m Patient Age:    11 years         BP:           157/90 mmHg Patient Gender: M                 HR:           101 bpm. Exam Location:  Inpatient Procedure: 2D Echo, Cardiac Doppler and Color Doppler  Results communicated to Dr Rodena Piety at 12:45pm. Indications:    CHF-Acute Systolic [id number]  History:        Patient has prior history of Echocardiogram examinations, most                 recent 05/22/2020. CHF and Idiopathic CMP, CAD, COPD; Risk                 Factors:Dyslipidemia, Hypertension and ETOH.  Sonographer:    Bernadene Person RDCS Referring Phys: [id number] [place]  1. Left ventricular ejection fraction, by estimation, is 25 to 30%. The left ventricle has severely decreased function. The left ventricle demonstrates global hypokinesis. There  is mild left ventricular hypertrophy. Left ventricular diastolic parameters  are indeterminate.  2. Right ventricular systolic function was not well visualized. The right ventricular size is not well visualized. There is normal pulmonary artery systolic pressure. The estimated right ventricular systolic pressure is [id number] mmHg.  3. The mitral valve is abnormal. Moderate mitral valve regurgitation. Appears functional.  4. The aortic valve is tricuspid. Aortic valve regurgitation is moderate. Mild aortic valve sclerosis is present, with no evidence of aortic valve stenosis.  5. The inferior vena cava is normal in size with greater than 50% respiratory variability, suggesting right atrial pressure of 3 mmHg. FINDINGS  Left Ventricle: Left ventricular ejection fraction, by estimation, is 25 to 30%. The left ventricle has severely decreased function. The left ventricle demonstrates global hypokinesis. The left ventricular internal cavity size was normal in size. There is mild left ventricular hypertrophy. Left ventricular diastolic parameters are indeterminate. Right Ventricle: The right ventricular size is not well visualized. Right vetricular wall thickness was not well visualized. Right ventricular systolic function was not well visualized. There is  normal pulmonary artery systolic pressure. The tricuspid regurgitant velocity is 2.65 m/s, and with an assumed right atrial pressure of 3 mmHg, the estimated right ventricular systolic pressure is 99991111 mmHg. Left Atrium: Left atrial size was normal in size. Right Atrium: Right atrial size was normal in size. Pericardium: There is no evidence of pericardial effusion. Mitral Valve: The mitral valve is abnormal. Moderate mitral valve regurgitation. Tricuspid Valve: The tricuspid valve is normal in structure. Tricuspid valve regurgitation is mild. Aortic Valve: The aortic valve is tricuspid. Aortic valve regurgitation is moderate. Aortic regurgitation PHT measures 291 msec. Mild aortic valve sclerosis is present, with no evidence of aortic valve stenosis. Pulmonic Valve: The pulmonic valve was not well visualized. Pulmonic valve regurgitation is trivial. Aorta: The aortic root and ascending aorta are structurally normal, with no evidence of dilitation. Venous: The inferior vena cava is normal in size with greater than 50% respiratory variability, suggesting right atrial pressure of 3 mmHg. IAS/Shunts: The interatrial septum was not well visualized. Additional Comments: There is a small pleural effusion in the left lateral region.  LEFT VENTRICLE PLAX 2D LVIDd:         5.20 cm LVIDs:         4.50 cm LV PW:         1.10 cm LV IVS:        0.90 cm LVOT diam:     2.10 cm LV SV:         39 LV SV Index:   23 LVOT Area:     3.46 cm  LV Volumes (MOD) LV vol d, MOD A2C: 134.0 ml LV vol d, MOD A4C: 130.0 ml LV vol s, MOD A2C: 96.4 ml LV vol s, MOD A4C: 103.0 ml LV SV MOD A2C:     37.6 ml LV SV MOD A4C:     130.0 ml LV SV MOD BP:      32.8 ml RIGHT VENTRICLE RV S prime:     6.15 cm/s TAPSE (M-mode): 1.6 cm LEFT ATRIUM             Index       RIGHT ATRIUM           Index LA diam:        3.60 cm 2.12 cm/m  RA Area:     12.80 cm LA Vol (A2C):   46.1 ml 27.18 ml/m RA Volume:  30.60 ml  18.04 ml/m LA Vol (A4C):   43.0 ml 25.35  ml/m LA Biplane Vol: 48.4 ml 28.53 ml/m  AORTIC VALVE LVOT Vmax:         83.65 cm/s LVOT Vmean:        52.400 cm/s LVOT VTI:          0.112 m AI PHT:            291 msec AR Vena Contracta: 0.40 cm  AORTA Ao Root diam: 3.20 cm Ao Asc diam:  2.90 cm MR Peak grad:    110.7 mmHg  TRICUSPID VALVE MR Mean grad:    69.0 mmHg   TR Peak grad:   28.1 mmHg MR Vmax:         526.00 cm/s TR Vmax:        265.00 cm/s MR Vmean:        389.0 cm/s MR PISA:         0.57 cm    SHUNTS MR PISA Eff ROA: 4 mm       Systemic VTI:  0.11 m MR PISA Radius:  0.30 cm     Systemic Diam: 2.10 cm Oswaldo Milian MD Electronically signed by Oswaldo Milian MD Signature Date/Time: 05/08/2021/12:50:38 PM    Final      Assessment and Plan:   1. Acute on chronic combined CHF with moderate aortic regurgitation: patient presented with progressive DOE>SOB at rest, orthopnea, PND, and weight gain. BNP was elevated to 3900. CXR with bilateral pleural effusions R>L, confirmed on CT. Echo this admission shows further decline in EF to 25-30 % from 35-40 % 05/2020.  He has been diuresed with IV Lasix twice daily with improvement in symptoms. - gave 80 IV X 1 today and will hold additional IV lasix; will give lasix as needed for weight gain of 3 pounds overnight or 5 pounds in 1 week - Continue metoprolol succinate (dose increased) and Entresto - will consider outpatient MRA and SGLT2i - Continue to monitor strict I&O's and daily weights - Continue to monitor electrolytes closely and replete as needed to maintain K >4, Mg >2.  - etiology of decompensation unclear.  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 CAD: known severe CAD which has been medically managed. No recent CP complaints. HsTrop with low flat trend in the  50s not c/w ACS. EKG is non-ischemic.  - Continue aspirin and statin - Continue Bblocker  3. HTN: BP markedly elevated on admission, improved this morning with continuation of home medications - Managed in the context of #1  4. HLD: LDL 41 10/2020 - Continue pravastatin  5. Valvulopathy: functional moderate MR and moderate AR on echo this admission.  - Continue diuresis as above  6. CKD stage 3a: Cr stable at 1.3 this admission - Continue routine monitoring  Discussed with patient and daughter [name] who have no further questions   For questions or updates, please contact [place] Please consult [email or web address] for contact info under   [facility], [address] [place], [place] [address] [phone]  11:53 AM

## 2021-05-09 NOTE — Progress Notes (Signed)
PROGRESS NOTE    Collin Young  [id number] DOB: 1951-02-22 DOA: 05/07/2021 PCP: Darreld Mclean, MD   Brief Narrative: 70 year old male with history of CKD stage III A, systolic and diastolic heart failure hepatitis C CAD admitted with dyspnea on exertion shortness of breath found to have fluid overload by physical exam and chest x-ray and CT of the chest with bilateral pleural effusion and elevated BNP of 3899.  Assessment & Plan:   Principal Problem:   Acute on chronic combined systolic and diastolic CHF (congestive heart failure) (HCC) Active Problems:   Coronary artery disease involving native coronary artery of native heart   Chronic kidney disease, stage 3a (HCC)   GERD without esophagitis   Mixed hyperlipidemia   Hypertensive urgency   Elevated troponin level not due myocardial infarction  #1 acute on chronic combined diastolic systolic heart failure with further worsening of systolic function. Echo done this admission with further decrease in ejection fraction. Patient admitted with orthopnea and dyspnea on exertion. Appreciate cardiology input. Continue Lasix metoprolol succinate Entresto follow-up labs in AM. Cr 1.29 stable   #2 hypertensive urgency continue metoprolol and Entresto improved since admission  #3 CKD stage III AAA stable follow-up in a.m. on Lasix  #4 GERD continue PPI  #5 hyperlipidemia on statin  #6 elevated troponin secondary to demand ischemia and CKD  #7 CAD-continue aspirin statin and metoprolol succinate  Estimated body mass index is 20.28 kg/m as calculated from the following:   Height as of this encounter: '5\' 6"'$  (1.676 m).   Weight as of this encounter: 57 kg.  DVT prophylaxis:lovenox Code Status: Full code Family Communication: None at bedside  disposition Plan:  Status is: Inpatient  Remains inpatient appropriate because:IV treatments appropriate due to intensity of illness or inability to take PO  Dispo: The patient is  from: Home              Anticipated d/c is to: Home              Patient currently is not medically stable to d/c.   Difficult to place patient No    Consultants: cardiology  Procedures: none Antimicrobials: none  Subjective: C/o sob and doe    Objective: Vitals:   05/09/21 0106 05/09/21 0442 05/09/21 0847 05/09/21 1236  BP: 139/90 137/83 114/63 118/70  Pulse: 96 95 94 82  Resp: '16 18 16 16  '$ Temp: 97.7 F (36.5 C) (!) 97.4 F (36.3 C) (!) 97.5 F (36.4 C) (!) 97.5 F (36.4 C)  TempSrc: Oral Oral Oral Oral  SpO2: 100% 93% 96% 99%  Weight:  57 kg    Height:        Intake/Output Summary (Last 24 hours) at 05/09/2021 1355 Last data filed at 05/09/2021 1134 Gross per 24 hour  Intake 120 ml  Output 1275 ml  Net -1155 ml   Filed Weights   05/08/21 1736 05/09/21 0442  Weight: 58.5 kg 57 kg    Examination:  General exam: Appears calm and comfortable  Respiratory system: Crackles b/l bases to auscultation. Respiratory effort normal. Cardiovascular system: S1 & S2 heard, RRR. No JVD, murmurs, rubs, gallops or clicks. No pedal edema. Gastrointestinal system: Abdomen is nondistended, soft and nontender. No organomegaly or masses felt. Normal bowel sounds heard. Central nervous system: Alert and oriented. No focal neurological deficits. Extremities: Symmetric 5 x 5 power. Skin: No rashes, lesions or ulcers Psychiatry: Judgement and insight appear normal. Mood & affect appropriate.     Data  Reviewed: I have personally reviewed following labs and imaging studies  CBC: Recent Labs  Lab 05/07/21 2308 05/08/21 1254  WBC 3.8* 3.7*  NEUTROABS 2.1 2.7  HGB 13.5 15.3  HCT 42.1 48.4  MCV 86.4 85.5  PLT 193 [id number]   Basic Metabolic Panel: Recent Labs  Lab 05/07/21 2308 05/08/21 1254 05/09/21 0748  NA 138 140 133*  K 4.7 3.9 3.9  CL 104 100 96*  CO2 '24 27 28  '$ GLUCOSE 107* 130* 102*  BUN 22 23 29*  CREATININE 1.39* 1.33* 1.36*  CALCIUM 9.6 9.8 8.9  MG  --  2.3  --     GFR: Estimated Creatinine Clearance: 40.7 mL/min (A) (by C-G formula based on SCr of 1.36 mg/dL (H)). Liver Function Tests: Recent Labs  Lab 05/07/21 2308 05/08/21 1254  AST 31 33  ALT 16 19  ALKPHOS 37* 45  BILITOT 0.6 0.7  PROT 7.5 8.7*  ALBUMIN 4.0 4.4   No results for input(s): LIPASE, AMYLASE in the last 168 hours. No results for input(s): AMMONIA in the last 168 hours. Coagulation Profile: No results for input(s): INR, PROTIME in the last 168 hours. Cardiac Enzymes: No results for input(s): CKTOTAL, CKMB, CKMBINDEX, TROPONINI in the last 168 hours. BNP (last 3 results) No results for input(s): PROBNP in the last 8760 hours. HbA1C: No results for input(s): HGBA1C in the last 72 hours. CBG: No results for input(s): GLUCAP in the last 168 hours. Lipid Profile: No results for input(s): CHOL, HDL, LDLCALC, TRIG, CHOLHDL, LDLDIRECT in the last 72 hours. Thyroid Function Tests: No results for input(s): TSH, T4TOTAL, FREET4, T3FREE, THYROIDAB in the last 72 hours. Anemia Panel: No results for input(s): VITAMINB12, FOLATE, FERRITIN, TIBC, IRON, RETICCTPCT in the last 72 hours. Sepsis Labs: No results for input(s): PROCALCITON, LATICACIDVEN in the last 168 hours.  Recent Results (from the past 240 hour(s))  Resp Panel by RT-PCR (Flu A&B, Covid) Nasopharyngeal Swab     Status: None   Collection Time: 05/07/21 11:00 PM   Specimen: Nasopharyngeal Swab; Nasopharyngeal(NP) swabs in vial transport medium  Result Value Ref Range Status   SARS Coronavirus 2 by RT PCR NEGATIVE NEGATIVE Final    Comment: (NOTE) SARS-CoV-2 target nucleic acids are NOT DETECTED.  The SARS-CoV-2 RNA is generally detectable in upper respiratory specimens during the acute phase of infection. The lowest concentration of SARS-CoV-2 viral copies this assay can detect is 138 copies/mL. A negative result does not preclude SARS-Cov-2 infection and should not be used as the sole basis for treatment  or other patient management decisions. A negative result may occur with  improper specimen collection/handling, submission of specimen other than nasopharyngeal swab, presence of viral mutation(s) within the areas targeted by this assay, and inadequate number of viral copies(<138 copies/mL). A negative result must be combined with clinical observations, patient history, and epidemiological information. The expected result is Negative.  Fact Sheet for Patients:  EntrepreneurPulse.com.au  Fact Sheet for Healthcare Providers:  IncredibleEmployment.be  This test is no t yet approved or cleared by the [place] FDA and  has been authorized for detection and/or diagnosis of SARS-CoV-2 by FDA under an Emergency Use Authorization (EUA). This EUA will remain  in effect (meaning this test can be used) for the duration of the COVID-19 declaration under Section 564(b)(1) of the Act, 21 U.S.C.section 360bbb-3(b)(1), unless the authorization is terminated  or revoked sooner.       Influenza A by PCR NEGATIVE NEGATIVE Final   Influenza B by  PCR NEGATIVE NEGATIVE Final    Comment: (NOTE) The Xpert Xpress SARS-CoV-2/FLU/RSV plus assay is intended as an aid in the diagnosis of influenza from Nasopharyngeal swab specimens and should not be used as a sole basis for treatment. Nasal washings and aspirates are unacceptable for Xpert Xpress SARS-CoV-2/FLU/RSV testing.  Fact Sheet for Patients: EntrepreneurPulse.com.au  Fact Sheet for Healthcare Providers: IncredibleEmployment.be  This test is not yet approved or cleared by the [place] FDA and has been authorized for detection and/or diagnosis of SARS-CoV-2 by FDA under an Emergency Use Authorization (EUA). This EUA will remain in effect (meaning this test can be used) for the duration of the COVID-19 declaration under Section 564(b)(1) of the Act, 21 U.S.C. section  360bbb-3(b)(1), unless the authorization is terminated or revoked.  Performed at [facility], [place] [address], [place], [place] [address]          Radiology Studies: CT Angio Chest PE W/Cm &/Or Wo Cm  Result Date: 05/08/2021 CLINICAL DATA:  Chest pain, shortness of breath, elevated D-dimer EXAM: CT ANGIOGRAPHY CHEST WITH CONTRAST TECHNIQUE: Multidetector CT imaging of the chest was performed using the standard protocol during bolus administration of intravenous contrast. Multiplanar CT image reconstructions and MIPs were obtained to evaluate the vascular anatomy. CONTRAST:  82m OMNIPAQUE IOHEXOL 350 MG/ML SOLN COMPARISON:  01/10/2010 FINDINGS: Cardiovascular: Cardiomegaly. Diffuse coronary artery and aortic calcifications. No evidence of aortic aneurysm. Lack of contrast in the lower lobe pulmonary arteries bilaterally both the common unopacified at a similar level suggesting this may be related to poor cardiac output rather than true pulmonary embolus. Mediastinum/Nodes: No mediastinal, hilar, or axillary adenopathy. Trachea and esophagus are unremarkable. Thyroid unremarkable. Lungs/Pleura: Large right pleural effusion and small left pleural effusion. Bilateral lower lobe airspace opacities could reflect atelectasis or infiltrates/pneumonia. Upper Abdomen: Imaging into the upper abdomen demonstrates no acute findings. Musculoskeletal: Chest wall soft tissues are unremarkable. No acute bony abnormality. Review of the MIP images confirms the above findings. IMPRESSION: Unopacified lower lobe pulmonary arteries bilaterally. Given the symmetric appearance in both lower lobes, this could be related to poor cardiac output and unopacified vessels rather than true pulmonary emboli. Pulmonary emboli however remain in the differential. Cardiomegaly.  Coronary artery disease. Large right pleural effusion and small left pleural effusion. Bilateral lower lobe atelectasis or  infiltrate/pneumonia. Aortic Atherosclerosis (ICD10-I70.0). Electronically Signed   By: KRolm BaptiseM.D.   On: 05/08/2021 00:08   DG Chest Portable 1 View  Result Date: 05/07/2021 CLINICAL DATA:  Dyspnea EXAM: PORTABLE CHEST 1 VIEW COMPARISON:  03/28/2021 FINDINGS: The lungs are symmetrically expanded. Small to moderate right and small left pleural effusions have developed. Mild bilateral perihilar interstitial pulmonary infiltrate has developed, asymmetrically more severe throughout the right lung, most in keeping with asymmetric pulmonary edema, less likely atypical infection. Cardiac size is mildly enlarged, new since prior examination. No pneumothorax. No acute bone abnormality. IMPRESSION: Mild cardiomegaly, new since prior examination, with changes of probable mild cardiogenic failure with perihilar asymmetric interstitial pulmonary edema and bilateral pleural effusions, right greater than left. Electronically Signed   By: AFidela SalisburyM.D.   On: 05/07/2021 23:01   ECHOCARDIOGRAM COMPLETE  Result Date: 05/08/2021    ECHOCARDIOGRAM REPORT   Patient Name:   Collin TORODate of Exam: 05/08/2021 Medical Rec #:  [id number]       Height:       66.0 in Accession #:    [id number]      Weight:  135.8 lb Date of Birth:  1951/05/14        BSA:          1.696 m Patient Age:    89 years         BP:           157/90 mmHg Patient Gender: M                HR:           101 bpm. Exam Location:  Inpatient Procedure: 2D Echo, Cardiac Doppler and Color Doppler  Results communicated to Dr Rodena Piety at 12:45pm. Indications:    CHF-Acute Systolic [id number]  History:        Patient has prior history of Echocardiogram examinations, most                 recent 05/22/2020. CHF and Idiopathic CMP, CAD, COPD; Risk                 Factors:Dyslipidemia, Hypertension and ETOH.  Sonographer:    Bernadene Person RDCS Referring Phys: [id number] [place]  1. Left ventricular ejection fraction, by estimation, is 25 to  30%. The left ventricle has severely decreased function. The left ventricle demonstrates global hypokinesis. There is mild left ventricular hypertrophy. Left ventricular diastolic parameters  are indeterminate.  2. Right ventricular systolic function was not well visualized. The right ventricular size is not well visualized. There is normal pulmonary artery systolic pressure. The estimated right ventricular systolic pressure is 99991111 mmHg.  3. The mitral valve is abnormal. Moderate mitral valve regurgitation. Appears functional.  4. The aortic valve is tricuspid. Aortic valve regurgitation is moderate. Mild aortic valve sclerosis is present, with no evidence of aortic valve stenosis.  5. The inferior vena cava is normal in size with greater than 50% respiratory variability, suggesting right atrial pressure of 3 mmHg. FINDINGS  Left Ventricle: Left ventricular ejection fraction, by estimation, is 25 to 30%. The left ventricle has severely decreased function. The left ventricle demonstrates global hypokinesis. The left ventricular internal cavity size was normal in size. There is mild left ventricular hypertrophy. Left ventricular diastolic parameters are indeterminate. Right Ventricle: The right ventricular size is not well visualized. Right vetricular wall thickness was not well visualized. Right ventricular systolic function was not well visualized. There is normal pulmonary artery systolic pressure. The tricuspid regurgitant velocity is 2.65 m/s, and with an assumed right atrial pressure of 3 mmHg, the estimated right ventricular systolic pressure is 99991111 mmHg. Left Atrium: Left atrial size was normal in size. Right Atrium: Right atrial size was normal in size. Pericardium: There is no evidence of pericardial effusion. Mitral Valve: The mitral valve is abnormal. Moderate mitral valve regurgitation. Tricuspid Valve: The tricuspid valve is normal in structure. Tricuspid valve regurgitation is mild. Aortic Valve: The  aortic valve is tricuspid. Aortic valve regurgitation is moderate. Aortic regurgitation PHT measures 291 msec. Mild aortic valve sclerosis is present, with no evidence of aortic valve stenosis. Pulmonic Valve: The pulmonic valve was not well visualized. Pulmonic valve regurgitation is trivial. Aorta: The aortic root and ascending aorta are structurally normal, with no evidence of dilitation. Venous: The inferior vena cava is normal in size with greater than 50% respiratory variability, suggesting right atrial pressure of 3 mmHg. IAS/Shunts: The interatrial septum was not well visualized. Additional Comments: There is a small pleural effusion in the left lateral region.  LEFT VENTRICLE PLAX 2D LVIDd:  5.20 cm LVIDs:         4.50 cm LV PW:         1.10 cm LV IVS:        0.90 cm LVOT diam:     2.10 cm LV SV:         39 LV SV Index:   23 LVOT Area:     3.46 cm  LV Volumes (MOD) LV vol d, MOD A2C: 134.0 ml LV vol d, MOD A4C: 130.0 ml LV vol s, MOD A2C: 96.4 ml LV vol s, MOD A4C: 103.0 ml LV SV MOD A2C:     37.6 ml LV SV MOD A4C:     130.0 ml LV SV MOD BP:      32.8 ml RIGHT VENTRICLE RV S prime:     6.15 cm/s TAPSE (M-mode): 1.6 cm LEFT ATRIUM             Index       RIGHT ATRIUM           Index LA diam:        3.60 cm 2.12 cm/m  RA Area:     12.80 cm LA Vol (A2C):   46.1 ml 27.18 ml/m RA Volume:   30.60 ml  18.04 ml/m LA Vol (A4C):   43.0 ml 25.35 ml/m LA Biplane Vol: 48.4 ml 28.53 ml/m  AORTIC VALVE LVOT Vmax:         83.65 cm/s LVOT Vmean:        52.400 cm/s LVOT VTI:          0.112 m AI PHT:            291 msec AR Vena Contracta: 0.40 cm  AORTA Ao Root diam: 3.20 cm Ao Asc diam:  2.90 cm MR Peak grad:    110.7 mmHg  TRICUSPID VALVE MR Mean grad:    69.0 mmHg   TR Peak grad:   28.1 mmHg MR Vmax:         526.00 cm/s TR Vmax:        265.00 cm/s MR Vmean:        389.0 cm/s MR PISA:         0.57 cm    SHUNTS MR PISA Eff ROA: 4 mm       Systemic VTI:  0.11 m MR PISA Radius:  0.30 cm     Systemic Diam: 2.10  cm Oswaldo Milian MD Electronically signed by Oswaldo Milian MD Signature Date/Time: 05/08/2021/12:50:38 PM    Final         Scheduled Meds:  aspirin EC  81 mg Oral Daily   enoxaparin (LOVENOX) injection  40 mg Subcutaneous Q24H   fentaNYL  1 patch Transdermal Q72H   [START ON 05/10/2021] metoprolol succinate  25 mg Oral Daily   multivitamin with minerals  1 tablet Oral Daily   pravastatin  80 mg Oral QPM   sacubitril-valsartan  1 tablet Oral BID   Continuous Infusions:   LOS: 1 day    Time spent: 39 min  Georgette Shell, MD  05/09/2021, 1:55 PM

## 2021-05-10 DIAGNOSIS — N1831 Chronic kidney disease, stage 3a: Secondary | ICD-10-CM | POA: Diagnosis not present

## 2021-05-10 DIAGNOSIS — I2510 Atherosclerotic heart disease of native coronary artery without angina pectoris: Secondary | ICD-10-CM | POA: Diagnosis not present

## 2021-05-10 DIAGNOSIS — R778 Other specified abnormalities of plasma proteins: Secondary | ICD-10-CM

## 2021-05-10 DIAGNOSIS — I5043 Acute on chronic combined systolic (congestive) and diastolic (congestive) heart failure: Secondary | ICD-10-CM | POA: Diagnosis not present

## 2021-05-10 LAB — BRAIN NATRIURETIC PEPTIDE: B Natriuretic Peptide: 2131 pg/mL — ABNORMAL HIGH (ref 0.0–100.0)

## 2021-05-10 LAB — BASIC METABOLIC PANEL
Anion gap: 10 (ref 5–15)
BUN: 34 mg/dL — ABNORMAL HIGH (ref 8–23)
CO2: 28 mmol/L (ref 22–32)
Calcium: 8.9 mg/dL (ref 8.9–10.3)
Chloride: 92 mmol/L — ABNORMAL LOW (ref 98–111)
Creatinine, Ser: 1.38 mg/dL — ABNORMAL HIGH (ref 0.61–1.24)
GFR, Estimated: 55 mL/min — ABNORMAL LOW (ref >60)
Glucose, Bld: 90 mg/dL (ref 70–99)
Potassium: 3.7 mmol/L (ref 3.5–5.1)
Sodium: 130 mmol/L — ABNORMAL LOW (ref 135–145)

## 2021-05-10 MED ORDER — DAPAGLIFLOZIN PROPANEDIOL 10 MG PO TABS
10.0000 mg | ORAL_TABLET | Freq: Every day | ORAL | Status: DC
  Filled 2021-05-10: qty 1

## 2021-05-10 MED ORDER — FUROSEMIDE 10 MG/ML IJ SOLN
80.0000 mg | Freq: Once | INTRAMUSCULAR | Status: AC
  Administered 2021-05-10: 80 mg via INTRAVENOUS
  Filled 2021-05-10: qty 8

## 2021-05-10 MED ORDER — DAPAGLIFLOZIN PROPANEDIOL 10 MG PO TABS
10.0000 mg | ORAL_TABLET | Freq: Every day | ORAL | Status: DC
  Administered 2021-05-10 – 2021-05-11 (×2): 10 mg via ORAL
  Filled 2021-05-10 (×2): qty 1

## 2021-05-10 NOTE — Progress Notes (Signed)
Report received from ongoing nurse. Agreed with nurse assessment of patient and will cont to monitor. 

## 2021-05-10 NOTE — Plan of Care (Signed)
?  Problem: Education: ?Goal: Ability to demonstrate management of disease process will improve ?Outcome: Progressing ?  ?Problem: Activity: ?Goal: Capacity to carry out activities will improve ?Outcome: Progressing ?  ?Problem: Cardiac: ?Goal: Ability to achieve and maintain adequate cardiopulmonary perfusion will improve ?Outcome: Progressing ?  ?

## 2021-05-10 NOTE — Plan of Care (Signed)
  Problem: Education: Goal: Ability to verbalize understanding of medication therapies will improve Outcome: Progressing   

## 2021-05-10 NOTE — Progress Notes (Signed)
PROGRESS NOTE    BECK TESHIMA  [id number] DOB: 07/22/1951 DOA: 05/07/2021 PCP: Darreld Mclean, MD   Brief Narrative: 70 year old male with history of CKD stage III A, systolic and diastolic heart failure hepatitis C CAD admitted with dyspnea on exertion shortness of breath found to have fluid overload by physical exam and chest x-ray and CT of the chest with bilateral pleural effusion and elevated BNP of 3899.  Assessment & Plan:   Principal Problem:   Acute on chronic combined systolic and diastolic CHF (congestive heart failure) (HCC) Active Problems:   Coronary artery disease involving native coronary artery of native heart   Chronic kidney disease, stage 3a (HCC)   GERD without esophagitis   Mixed hyperlipidemia   Hypertensive urgency   Elevated troponin level not due myocardial infarction  #1 acute on chronic combined diastolic systolic heart failure with further worsening of systolic function. Echo done this admission with further decrease in ejection fraction. Patient admitted with orthopnea and dyspnea on exertion. Appreciate cardiology input. Continue metoprolol succinate Entresto follow-up labs in AM. Farxiga started 05/10/2021 Cr 1.38 trending up Lasix 80 mg 1 dose today  #2 hypertensive urgency continue metoprolol and Entresto improved since admission  #3 CKD stage III AAA creatinine slowly trending up follow-up in a.m.  #4 GERD continue PPI  #5 hyperlipidemia on statin  #6 elevated troponin secondary to demand ischemia and CKD  #7 CAD-continue aspirin statin and metoprolol succinate  #8 hyponatremia sodium 130 down from 140.  Likely induced by Lasix.  Estimated body mass index is 20.28 kg/m as calculated from the following:   Height as of this encounter: '5\' 6"'$  (1.676 m).   Weight as of this encounter: 57 kg.  DVT prophylaxis:lovenox Code Status: Full code Family Communication: None at bedside  disposition Plan:  Status is: Inpatient  Remains  inpatient appropriate because:IV treatments appropriate due to intensity of illness or inability to take PO  Dispo: The patient is from: Home              Anticipated d/c is to: Home              Patient currently is not medically stable to d/c.   Difficult to place patient No    Consultants: cardiology  Procedures: none Antimicrobials: none  Subjective: He is resting in bed denies any new complaints BNP today is 2131 Sodium 130 and trending down Creatinine 1.38 and trending up Objective: Vitals:   05/09/21 1236 05/09/21 1942 05/10/21 0512 05/10/21 1323  BP: 118/70 140/82 138/74 126/77  Pulse: 82 89 88 80  Resp: '16 17  18  '$ Temp: (!) 97.5 F (36.4 C) 98.2 F (36.8 C) 97.9 F (36.6 C) 97.7 F (36.5 C)  TempSrc: Oral Oral Oral Oral  SpO2: 99% 98% 96% 96%  Weight:      Height:        Intake/Output Summary (Last 24 hours) at 05/10/2021 1516 Last data filed at 05/10/2021 0500 Gross per 24 hour  Intake 120 ml  Output 1000 ml  Net -880 ml    Filed Weights   05/08/21 1736 05/09/21 0442  Weight: 58.5 kg 57 kg    Examination:  General exam: Appears calm and comfortable  Respiratory system: Crackles b/l bases to auscultation. Respiratory effort normal. Cardiovascular system: S1 & S2 heard, RRR. No JVD, murmurs, rubs, gallops or clicks. No pedal edema. Gastrointestinal system: Abdomen is nondistended, soft and nontender. No organomegaly or masses felt. Normal bowel sounds heard. Central  nervous system: Alert and oriented. No focal neurological deficits. Extremities: Symmetric 5 x 5 power. Skin: No rashes, lesions or ulcers Psychiatry: Judgement and insight appear normal. Mood & affect appropriate.     Data Reviewed: I have personally reviewed following labs and imaging studies  CBC: Recent Labs  Lab 05/07/21 2308 05/08/21 1254  WBC 3.8* 3.7*  NEUTROABS 2.1 2.7  HGB 13.5 15.3  HCT 42.1 48.4  MCV 86.4 85.5  PLT 193 [id number]    Basic Metabolic Panel: Recent Labs   Lab 05/07/21 2308 05/08/21 1254 05/09/21 0748 05/10/21 0514  NA 138 140 133* 130*  K 4.7 3.9 3.9 3.7  CL 104 100 96* 92*  CO2 '24 27 28 28  '$ GLUCOSE 107* 130* 102* 90  BUN 22 23 29* 34*  CREATININE 1.39* 1.33* 1.36* 1.38*  CALCIUM 9.6 9.8 8.9 8.9  MG  --  2.3  --   --     GFR: Estimated Creatinine Clearance: 40.2 mL/min (A) (by C-G formula based on SCr of 1.38 mg/dL (H)). Liver Function Tests: Recent Labs  Lab 05/07/21 2308 05/08/21 1254  AST 31 33  ALT 16 19  ALKPHOS 37* 45  BILITOT 0.6 0.7  PROT 7.5 8.7*  ALBUMIN 4.0 4.4    No results for input(s): LIPASE, AMYLASE in the last 168 hours. No results for input(s): AMMONIA in the last 168 hours. Coagulation Profile: No results for input(s): INR, PROTIME in the last 168 hours. Cardiac Enzymes: No results for input(s): CKTOTAL, CKMB, CKMBINDEX, TROPONINI in the last 168 hours. BNP (last 3 results) No results for input(s): PROBNP in the last 8760 hours. HbA1C: No results for input(s): HGBA1C in the last 72 hours. CBG: No results for input(s): GLUCAP in the last 168 hours. Lipid Profile: No results for input(s): CHOL, HDL, LDLCALC, TRIG, CHOLHDL, LDLDIRECT in the last 72 hours. Thyroid Function Tests: No results for input(s): TSH, T4TOTAL, FREET4, T3FREE, THYROIDAB in the last 72 hours. Anemia Panel: No results for input(s): VITAMINB12, FOLATE, FERRITIN, TIBC, IRON, RETICCTPCT in the last 72 hours. Sepsis Labs: No results for input(s): PROCALCITON, LATICACIDVEN in the last 168 hours.  Recent Results (from the past 240 hour(s))  Resp Panel by RT-PCR (Flu A&B, Covid) Nasopharyngeal Swab     Status: None   Collection Time: 05/07/21 11:00 PM   Specimen: Nasopharyngeal Swab; Nasopharyngeal(NP) swabs in vial transport medium  Result Value Ref Range Status   SARS Coronavirus 2 by RT PCR NEGATIVE NEGATIVE Final    Comment: (NOTE) SARS-CoV-2 target nucleic acids are NOT DETECTED.  The SARS-CoV-2 RNA is generally  detectable in upper respiratory specimens during the acute phase of infection. The lowest concentration of SARS-CoV-2 viral copies this assay can detect is 138 copies/mL. A negative result does not preclude SARS-Cov-2 infection and should not be used as the sole basis for treatment or other patient management decisions. A negative result may occur with  improper specimen collection/handling, submission of specimen other than nasopharyngeal swab, presence of viral mutation(s) within the areas targeted by this assay, and inadequate number of viral copies(<138 copies/mL). A negative result must be combined with clinical observations, patient history, and epidemiological information. The expected result is Negative.  Fact Sheet for Patients:  EntrepreneurPulse.com.au  Fact Sheet for Healthcare Providers:  IncredibleEmployment.be  This test is no t yet approved or cleared by the [place] FDA and  has been authorized for detection and/or diagnosis of SARS-CoV-2 by FDA under an Emergency Use Authorization (EUA). This EUA will remain  in effect (meaning this test can be used) for the duration of the COVID-19 declaration under Section 564(b)(1) of the Act, 21 U.S.C.section 360bbb-3(b)(1), unless the authorization is terminated  or revoked sooner.       Influenza A by PCR NEGATIVE NEGATIVE Final   Influenza B by PCR NEGATIVE NEGATIVE Final    Comment: (NOTE) The Xpert Xpress SARS-CoV-2/FLU/RSV plus assay is intended as an aid in the diagnosis of influenza from Nasopharyngeal swab specimens and should not be used as a sole basis for treatment. Nasal washings and aspirates are unacceptable for Xpert Xpress SARS-CoV-2/FLU/RSV testing.  Fact Sheet for Patients: EntrepreneurPulse.com.au  Fact Sheet for Healthcare Providers: IncredibleEmployment.be  This test is not yet approved or cleared by the [place] FDA  and has been authorized for detection and/or diagnosis of SARS-CoV-2 by FDA under an Emergency Use Authorization (EUA). This EUA will remain in effect (meaning this test can be used) for the duration of the COVID-19 declaration under Section 564(b)(1) of the Act, 21 U.S.C. section 360bbb-3(b)(1), unless the authorization is terminated or revoked.  Performed at [facility], [place] [address], [place], [place] [address]           Radiology Studies: No results found.      Scheduled Meds:  aspirin EC  81 mg Oral Daily   dapagliflozin propanediol  10 mg Oral Daily   enoxaparin (LOVENOX) injection  40 mg Subcutaneous Q24H   fentaNYL  1 patch Transdermal Q72H   furosemide  80 mg Intravenous Once   metoprolol succinate  25 mg Oral Daily   multivitamin with minerals  1 tablet Oral Daily   pravastatin  80 mg Oral QPM   sacubitril-valsartan  1 tablet Oral BID   Continuous Infusions:   LOS: 2 days    Time spent: 39 min  Georgette Shell, MD  05/10/2021, 3:16 PM

## 2021-05-10 NOTE — Progress Notes (Signed)
Progress Note  Patient Name: Collin Young Date of Encounter: 05/10/2021  [facility] Cardiologist: Skeet Latch, MD   Subjective   Feeling much better.  Breathing is improved.  Ambulated without SOB.  Notes that he drinks a lot of water at home.   Inpatient Medications    Scheduled Meds:  aspirin EC  81 mg Oral Daily   enoxaparin (LOVENOX) injection  40 mg Subcutaneous Q24H   fentaNYL  1 patch Transdermal Q72H   metoprolol succinate  25 mg Oral Daily   multivitamin with minerals  1 tablet Oral Daily   pravastatin  80 mg Oral QPM   sacubitril-valsartan  1 tablet Oral BID   Continuous Infusions:  PRN Meds: acetaminophen **OR** acetaminophen, albuterol, hydrALAZINE, melatonin, ondansetron **OR** ondansetron (ZOFRAN) IV, polyethylene glycol   Vital Signs    Vitals:   05/09/21 0847 05/09/21 1236 05/09/21 1942 05/10/21 0512  BP: 114/63 118/70 140/82 138/74  Pulse: 94 82 89 88  Resp: '16 16 17   '$ Temp: (!) 97.5 F (36.4 C) (!) 97.5 F (36.4 C) 98.2 F (36.8 C) 97.9 F (36.6 C)  TempSrc: Oral Oral Oral Oral  SpO2: 96% 99% 98% 96%  Weight:      Height:        Intake/Output Summary (Last 24 hours) at 05/10/2021 1140 Last data filed at 05/10/2021 0500 Gross per 24 hour  Intake 120 ml  Output 1000 ml  Net -880 ml   Last 3 Weights 05/09/2021 05/08/2021 03/28/2021  Weight (lbs) 125 lb 10.6 oz 128 lb 14.4 oz 135 lb 12.8 oz  Weight (kg) 57 kg 58.469 kg 61.598 kg      Telemetry    Sinus rhythm.  PVCs.  Up to 8 beats NSVT.  - Personally Reviewed  ECG    N/a - Personally Reviewed  Physical Exam   VS:  BP 138/74   Pulse 88   Temp 97.9 F (36.6 C) (Oral)   Resp 17   Ht '5\' 6"'$  (1.676 m)   Wt 57 kg   SpO2 96%   BMI 20.28 kg/m  , BMI Body mass index is 20.28 kg/m. GENERAL:  Well appearing.  Frail.  No acute distress. HEENT: Pupils equal round and reactive, fundi not visualized, oral mucosa unremarkable NECK:  No jugular venous distention, waveform within  normal limits, carotid upstroke brisk and symmetric, no bruits LUNGS:  Clear to auscultation bilaterally HEART:  RRR.  PMI not displaced or sustained,S1 and S2 within normal limits, no S3, no S4, no clicks, no rubs, no murmurs ABD:  Flat, positive bowel sounds normal in frequency in pitch, no bruits, no rebound, no guarding, no midline pulsatile mass, no hepatomegaly, no splenomegaly EXT:  2 plus pulses throughout, no edema, no cyanosis no clubbing SKIN:  No rashes no nodules NEURO:  Cranial nerves II through XII grossly intact, motor grossly intact throughout PSYCH:  Cognitively intact, oriented to person place and time   Labs    High Sensitivity Troponin:   Recent Labs  Lab 05/07/21 2308 05/08/21 0104  TROPONINIHS 57* 50*      Chemistry Recent Labs  Lab 05/07/21 2308 05/08/21 1254 05/09/21 0748 05/10/21 0514  NA 138 140 133* 130*  K 4.7 3.9 3.9 3.7  CL 104 100 96* 92*  CO2 '24 27 28 28  '$ GLUCOSE 107* 130* 102* 90  BUN 22 23 29* 34*  CREATININE 1.39* 1.33* 1.36* 1.38*  CALCIUM 9.6 9.8 8.9 8.9  PROT 7.5 8.7*  --   --  ALBUMIN 4.0 4.4  --   --   AST 31 33  --   --   ALT 16 19  --   --   ALKPHOS 37* 45  --   --   BILITOT 0.6 0.7  --   --   GFRNONAA 55* 58* 56* 55*  ANIONGAP '10 13 9 10     '$ Hematology Recent Labs  Lab 05/07/21 2308 05/08/21 1254  WBC 3.8* 3.7*  RBC 4.87 5.66  HGB 13.5 15.3  HCT 42.1 48.4  MCV 86.4 85.5  MCH 27.7 27.0  MCHC 32.1 31.6  RDW 14.9 15.1  PLT 193 234    BNP Recent Labs  Lab 05/07/21 2311  BNP 3,899.7*     DDimer No results for input(s): DDIMER in the last 168 hours.   Radiology    ECHOCARDIOGRAM COMPLETE  Result Date: 05/08/2021    ECHOCARDIOGRAM REPORT   Patient Name:   [name] Date of Exam: 05/08/2021 Medical Rec #:  [id number]        Height:       66.0 in Accession #:    [id number]       Weight:       135.8 lb Date of Birth:  04-Nov-1950        BSA:          1.696 m Patient Age:    69 years         BP:            157/90 mmHg Patient Gender: M                HR:           101 bpm. Exam Location:  Inpatient Procedure: 2D Echo, Cardiac Doppler and Color Doppler  Results communicated to Dr Rodena Piety at 12:45pm. Indications:    CHF-Acute Systolic [id number]  History:        Patient has prior history of Echocardiogram examinations, most                 recent 05/22/2020. CHF and Idiopathic CMP, CAD, COPD; Risk                 Factors:Dyslipidemia, Hypertension and ETOH.  Sonographer:    Bernadene Person RDCS Referring Phys: [id number] [place]  1. Left ventricular ejection fraction, by estimation, is 25 to 30%. The left ventricle has severely decreased function. The left ventricle demonstrates global hypokinesis. There is mild left ventricular hypertrophy. Left ventricular diastolic parameters  are indeterminate.  2. Right ventricular systolic function was not well visualized. The right ventricular size is not well visualized. There is normal pulmonary artery systolic pressure. The estimated right ventricular systolic pressure is 99991111 mmHg.  3. The mitral valve is abnormal. Moderate mitral valve regurgitation. Appears functional.  4. The aortic valve is tricuspid. Aortic valve regurgitation is moderate. Mild aortic valve sclerosis is present, with no evidence of aortic valve stenosis.  5. The inferior vena cava is normal in size with greater than 50% respiratory variability, suggesting right atrial pressure of 3 mmHg. FINDINGS  Left Ventricle: Left ventricular ejection fraction, by estimation, is 25 to 30%. The left ventricle has severely decreased function. The left ventricle demonstrates global hypokinesis. The left ventricular internal cavity size was normal in size. There is mild left ventricular hypertrophy. Left ventricular diastolic parameters are indeterminate. Right Ventricle: The right ventricular size is not well visualized. Right vetricular wall thickness was not well visualized. Right ventricular  systolic  function was not well visualized. There is normal pulmonary artery systolic pressure. The tricuspid regurgitant velocity is 2.65 m/s, and with an assumed right atrial pressure of 3 mmHg, the estimated right ventricular systolic pressure is 99991111 mmHg. Left Atrium: Left atrial size was normal in size. Right Atrium: Right atrial size was normal in size. Pericardium: There is no evidence of pericardial effusion. Mitral Valve: The mitral valve is abnormal. Moderate mitral valve regurgitation. Tricuspid Valve: The tricuspid valve is normal in structure. Tricuspid valve regurgitation is mild. Aortic Valve: The aortic valve is tricuspid. Aortic valve regurgitation is moderate. Aortic regurgitation PHT measures 291 msec. Mild aortic valve sclerosis is present, with no evidence of aortic valve stenosis. Pulmonic Valve: The pulmonic valve was not well visualized. Pulmonic valve regurgitation is trivial. Aorta: The aortic root and ascending aorta are structurally normal, with no evidence of dilitation. Venous: The inferior vena cava is normal in size with greater than 50% respiratory variability, suggesting right atrial pressure of 3 mmHg. IAS/Shunts: The interatrial septum was not well visualized. Additional Comments: There is a small pleural effusion in the left lateral region.  LEFT VENTRICLE PLAX 2D LVIDd:         5.20 cm LVIDs:         4.50 cm LV PW:         1.10 cm LV IVS:        0.90 cm LVOT diam:     2.10 cm LV SV:         39 LV SV Index:   23 LVOT Area:     3.46 cm  LV Volumes (MOD) LV vol d, MOD A2C: 134.0 ml LV vol d, MOD A4C: 130.0 ml LV vol s, MOD A2C: 96.4 ml LV vol s, MOD A4C: 103.0 ml LV SV MOD A2C:     37.6 ml LV SV MOD A4C:     130.0 ml LV SV MOD BP:      32.8 ml RIGHT VENTRICLE RV S prime:     6.15 cm/s TAPSE (M-mode): 1.6 cm LEFT ATRIUM             Index       RIGHT ATRIUM           Index LA diam:        3.60 cm 2.12 cm/m  RA Area:     12.80 cm LA Vol (A2C):   46.1 ml 27.18 ml/m RA Volume:   30.60 ml   18.04 ml/m LA Vol (A4C):   43.0 ml 25.35 ml/m LA Biplane Vol: 48.4 ml 28.53 ml/m  AORTIC VALVE LVOT Vmax:         83.65 cm/s LVOT Vmean:        52.400 cm/s LVOT VTI:          0.112 m AI PHT:            291 msec AR Vena Contracta: 0.40 cm  AORTA Ao Root diam: 3.20 cm Ao Asc diam:  2.90 cm MR Peak grad:    110.7 mmHg  TRICUSPID VALVE MR Mean grad:    69.0 mmHg   TR Peak grad:   28.1 mmHg MR Vmax:         526.00 cm/s TR Vmax:        265.00 cm/s MR Vmean:        389.0 cm/s MR PISA:         0.57 cm    SHUNTS MR PISA Eff ROA:  4 mm       Systemic VTI:  0.11 m MR PISA Radius:  0.30 cm     Systemic Diam: 2.10 cm Oswaldo Milian MD Electronically signed by Oswaldo Milian MD Signature Date/Time: 05/08/2021/12:50:38 PM    Final     Cardiac Studies   Echocardiogram 05/08/21: 1. Left ventricular ejection fraction, by estimation, is 25 to 30%. The  left ventricle has severely decreased function. The left ventricle  demonstrates global hypokinesis. There is mild left ventricular  hypertrophy. Left ventricular diastolic parameters   are indeterminate.   2. Right ventricular systolic function was not well visualized. The right  ventricular size is not well visualized. There is normal pulmonary artery  systolic pressure. The estimated right ventricular systolic pressure is  99991111 mmHg.   3. The mitral valve is abnormal. Moderate mitral valve regurgitation.  Appears functional.   4. The aortic valve is tricuspid. Aortic valve regurgitation is moderate.  Mild aortic valve sclerosis is present, with no evidence of aortic valve  stenosis.   5. The inferior vena cava is normal in size with greater than 50%  respiratory variability, suggesting right atrial pressure of 3 mmHg.    Echo 05/29/20:  1. Left ventricular ejection fraction, by estimation, is 35 to 40%. The  left ventricle has moderately decreased function. The left ventricle  demonstrates regional wall motion abnormalities (see scoring   diagram/findings for description). Left ventricular   diastolic parameters are consistent with Grade I diastolic dysfunction  (impaired relaxation). There is severe hypokinesis of the left  ventricular, basal-mid inferoseptal wall, inferior wall and inferolateral  wall.   2. Right ventricular systolic function is normal. The right ventricular  size is normal. There is mildly elevated pulmonary artery systolic  pressure.   3. Left atrial size was mildly dilated.   4. The mitral valve is normal in structure. Trivial mitral valve  regurgitation.   5. The aortic valve is tricuspid. There is mild calcification of the  aortic valve. There is mild thickening of the aortic valve. Aortic valve  regurgitation is mild to moderate. Mild aortic valve sclerosis is present,  with no evidence of aortic valve  stenosis.    Echo 05/06/17: Study Conclusions   - Left ventricle: Diffuse hypokinesis slightly worse in inferior   wall. The cavity size was mildly dilated. Systolic function was   mildly reduced. The estimated ejection fraction was in the range   of 45% to 50%. Doppler parameters are consistent with abnormal   left ventricular relaxation (grade 1 diastolic dysfunction). - Aortic valve: Severely calcified non coronary cusp. There was   moderate regurgitation. - Mitral valve: There was mild regurgitation. - Left atrium: The atrium was mildly dilated. - Atrial septum: No defect or patent foramen ovale was identified.   Echo 01/03/17: Study Conclusions   - Left ventricle: The cavity size was normal. Wall thickness was   normal. The estimated ejection fraction was in the range of 10%   to 15%. Diffuse hypokinesis. DIastolic function is abnormal,   indeterminant grade. - Aortic valve: There was moderate regurgitation. Valve area (VTI):   1.08 cm^2. Valve area (Vmax): 1.5 cm^2. Valve area (Vmean): 1.5   cm^2. - Mitral valve: There was moderate regurgitation. - Left atrium: The atrium was  mildly dilated. - Right ventricle: Systolic function was mildly to moderately   reduced. - Tricuspid valve: There was moderate regurgitation. - Pulmonary arteries: Systolic pressure was moderately increased.   PA peak pressure: 49 mm Hg (S).  LHC 01/05/17: Prox RCA to Mid RCA lesion, 99 %stenosed. Mid RCA lesion, 30 %stenosed. Dist RCA lesion, 40 %stenosed. RPDA lesion, 20 %stenosed. Prox Cx lesion, 100 %stenosed. Ost Ramus to Ramus lesion, 50 %stenosed. Ost 3rd Diag to 3rd Diag lesion, 30 %stenosed. Mid LAD lesion, 40 %stenosed. Ost LAD to Prox LAD lesion, 20 %stenosed. Ost LM to LM lesion, 40 %stenosed. Hemodynamic findings consistent with mild pulmonary hypertension.   1. Severe calcific disease in the proximal to mid RCA 2. 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 Moderate left main and mid LAD stenosis 4. Elevated filling pressures  Patient Profile     70 y.o. male with chronic systolic and diastolic heart failure, medically managed obstructive CAD, hypertension, hyperlipidemia, mitral regurgitation, aortic regurgitation, and CKD 3 admitted with acute on chronic systolic and diastolic heart failure.    Assessment & Plan    # Acute on chronic systolic and diastolic heart failure: # Hypertension:  # Valvular heart disease: LVEF 25-30% down from 35-40% 05/2020. It has been as low as 10-15% in the past.  He was admitted with volume overload in the setting of drinking a lot of water.  No ischemic symptoms though he has known ischemic cardiomyopathy not revascularized due to acute illness when diagnosed.  Clinically he has done quite well with medical therapy.  He also has moderate AR.  We will plan for outpatient stress MRI. This will help clarify viability and ischemia.  We can also assess the severity of his AR.  For now, continue medical management.  He appears euvolemic, but worsening hyponatremia.  May be dry now.  Will check BNP.  Start Farxiga '10mg'$  daily.   Metoprolol was increased.  Continue Entresto.  Hold off on spironolactone 2/2 frequent hypotension.   # CAD:  # Hyperlipidemia:  Continue Repatha, aspirin, pravastatin and metoprolol. Outpatient stress MRI as above.      For questions or updates, please contact [place] Please consult [email or web address] for contact info under        Signed, Skeet Latch, MD  05/10/2021, 11:40 AM

## 2021-05-11 ENCOUNTER — Other Ambulatory Visit: Payer: Self-pay | Admitting: Physician Assistant

## 2021-05-11 DIAGNOSIS — N1831 Chronic kidney disease, stage 3a: Secondary | ICD-10-CM | POA: Diagnosis not present

## 2021-05-11 DIAGNOSIS — I255 Ischemic cardiomyopathy: Secondary | ICD-10-CM

## 2021-05-11 DIAGNOSIS — I259 Chronic ischemic heart disease, unspecified: Secondary | ICD-10-CM

## 2021-05-11 DIAGNOSIS — I5043 Acute on chronic combined systolic (congestive) and diastolic (congestive) heart failure: Secondary | ICD-10-CM | POA: Diagnosis not present

## 2021-05-11 DIAGNOSIS — R778 Other specified abnormalities of plasma proteins: Secondary | ICD-10-CM | POA: Diagnosis not present

## 2021-05-11 DIAGNOSIS — I2510 Atherosclerotic heart disease of native coronary artery without angina pectoris: Secondary | ICD-10-CM | POA: Diagnosis not present

## 2021-05-11 LAB — BRAIN NATRIURETIC PEPTIDE: B Natriuretic Peptide: 1797.3 pg/mL — ABNORMAL HIGH (ref 0.0–100.0)

## 2021-05-11 LAB — BASIC METABOLIC PANEL
Anion gap: 12 (ref 5–15)
BUN: 39 mg/dL — ABNORMAL HIGH (ref 8–23)
CO2: 29 mmol/L (ref 22–32)
Calcium: 8.9 mg/dL (ref 8.9–10.3)
Chloride: 90 mmol/L — ABNORMAL LOW (ref 98–111)
Creatinine, Ser: 1.69 mg/dL — ABNORMAL HIGH (ref 0.61–1.24)
GFR, Estimated: 43 mL/min — ABNORMAL LOW (ref >60)
Glucose, Bld: 102 mg/dL — ABNORMAL HIGH (ref 70–99)
Potassium: 3.9 mmol/L (ref 3.5–5.1)
Sodium: 131 mmol/L — ABNORMAL LOW (ref 135–145)

## 2021-05-11 MED ORDER — DAPAGLIFLOZIN PROPANEDIOL 10 MG PO TABS: 10.0000 mg | ORAL_TABLET | Freq: Every day | ORAL | 2 refills | Status: DC

## 2021-05-11 MED ORDER — FUROSEMIDE 40 MG PO TABS: ORAL_TABLET | ORAL | 2 refills | Status: DC

## 2021-05-11 MED ORDER — POLYETHYLENE GLYCOL 3350 17 G PO PACK: 17.0000 g | PACK | Freq: Every day | ORAL | 0 refills | Status: AC | PRN

## 2021-05-11 MED ORDER — METOPROLOL SUCCINATE ER 25 MG PO TB24: 12.5000 mg | ORAL_TABLET | Freq: Every day | ORAL | 2 refills | Status: DC

## 2021-05-11 MED ORDER — METOPROLOL SUCCINATE ER 25 MG PO TB24: 12.5000 mg | ORAL_TABLET | Freq: Every day | ORAL | Status: DC

## 2021-05-11 NOTE — Progress Notes (Signed)
Progress Note  Patient Name: Collin Young Date of Encounter: 05/11/2021  [facility] Cardiologist: Skeet Latch, MD   Subjective   He is feeling much better.  His breathing is at baseline and he is eager to go home.  Inpatient Medications    Scheduled Meds:  aspirin EC  81 mg Oral Daily   dapagliflozin propanediol  10 mg Oral Daily   enoxaparin (LOVENOX) injection  40 mg Subcutaneous Q24H   fentaNYL  1 patch Transdermal Q72H   metoprolol succinate  25 mg Oral Daily   multivitamin with minerals  1 tablet Oral Daily   pravastatin  80 mg Oral QPM   sacubitril-valsartan  1 tablet Oral BID   Continuous Infusions:  PRN Meds: acetaminophen **OR** acetaminophen, albuterol, hydrALAZINE, melatonin, ondansetron **OR** ondansetron (ZOFRAN) IV, polyethylene glycol   Vital Signs    Vitals:   05/10/21 1323 05/10/21 1900 05/11/21 0500 05/11/21 0512  BP: 126/77 128/77  133/70  Pulse: 80 82  82  Resp: '18 18  18  '$ Temp: 97.7 F (36.5 C) 98.3 F (36.8 C)  98 F (36.7 C)  TempSrc: Oral Oral  Oral  SpO2: 96% 94%  92%  Weight:   56 kg   Height:        Intake/Output Summary (Last 24 hours) at 05/11/2021 1004 Last data filed at 05/10/2021 2100 Gross per 24 hour  Intake --  Output 1800 ml  Net -1800 ml   Last 3 Weights 05/11/2021 05/09/2021 05/08/2021  Weight (lbs) 123 lb 7.3 oz 125 lb 10.6 oz 128 lb 14.4 oz  Weight (kg) 56 kg 57 kg 58.469 kg      Telemetry    Sinus rhythm.  PVCs.  - Personally Reviewed  ECG    N/a - Personally Reviewed  Physical Exam   VS:  BP 133/70 (BP Location: Right Arm)   Pulse 82   Temp 98 F (36.7 C) (Oral)   Resp 18   Ht '5\' 6"'$  (1.676 m)   Wt 56 kg   SpO2 92%   BMI 19.93 kg/m  , BMI Body mass index is 19.93 kg/m. GENERAL:  Well appearing.  Frail.  No acute distress. HEENT: Pupils equal round and reactive, fundi not visualized, oral mucosa unremarkable NECK:  No jugular venous distention, waveform within normal limits, carotid  upstroke brisk and symmetric, no bruits LUNGS:  Clear to auscultation bilaterally HEART:  RRR.  PMI not displaced or sustained,S1 and S2 within normal limits, no S3, no S4, no clicks, no rubs, no murmurs ABD:  Flat, positive bowel sounds normal in frequency in pitch, no bruits, no rebound, no guarding, no midline pulsatile mass, no hepatomegaly, no splenomegaly EXT:  2 plus pulses throughout, no edema, no cyanosis no clubbing SKIN:  No rashes no nodules NEURO:  Cranial nerves II through XII grossly intact, motor grossly intact throughout PSYCH:  Cognitively intact, oriented to person place and time   Labs    High Sensitivity Troponin:   Recent Labs  Lab 05/07/21 2308 05/08/21 0104  TROPONINIHS 57* 50*      Chemistry Recent Labs  Lab 05/07/21 2308 05/08/21 1254 05/09/21 0748 05/10/21 0514 05/11/21 0523  NA 138 140 133* 130* 131*  K 4.7 3.9 3.9 3.7 3.9  CL 104 100 96* 92* 90*  CO2 '24 27 28 28 29  '$ GLUCOSE 107* 130* 102* 90 102*  BUN 22 23 29* 34* 39*  CREATININE 1.39* 1.33* 1.36* 1.38* 1.69*  CALCIUM 9.6 9.8 8.9 8.9  8.9  PROT 7.5 8.7*  --   --   --   ALBUMIN 4.0 4.4  --   --   --   AST 31 33  --   --   --   ALT 16 19  --   --   --   ALKPHOS 37* 45  --   --   --   BILITOT 0.6 0.7  --   --   --   GFRNONAA 55* 58* 56* 55* 43*  ANIONGAP '10 13 9 10 12     '$ Hematology Recent Labs  Lab 05/07/21 2308 05/08/21 1254  WBC 3.8* 3.7*  RBC 4.87 5.66  HGB 13.5 15.3  HCT 42.1 48.4  MCV 86.4 85.5  MCH 27.7 27.0  MCHC 32.1 31.6  RDW 14.9 15.1  PLT 193 234    BNP Recent Labs  Lab 05/07/21 2311 05/10/21 0514 05/11/21 0523  BNP 3,899.7* 2,131.0* 1,797.3*     DDimer No results for input(s): DDIMER in the last 168 hours.   Radiology    No results found.  Cardiac Studies   Echocardiogram 05/08/21: 1. Left ventricular ejection fraction, by estimation, is 25 to 30%. The  left ventricle has severely decreased function. The left ventricle  demonstrates global  hypokinesis. There is mild left ventricular  hypertrophy. Left ventricular diastolic parameters   are indeterminate.   2. Right ventricular systolic function was not well visualized. The right  ventricular size is not well visualized. There is normal pulmonary artery  systolic pressure. The estimated right ventricular systolic pressure is  99991111 mmHg.   3. The mitral valve is abnormal. Moderate mitral valve regurgitation.  Appears functional.   4. The aortic valve is tricuspid. Aortic valve regurgitation is moderate.  Mild aortic valve sclerosis is present, with no evidence of aortic valve  stenosis.   5. The inferior vena cava is normal in size with greater than 50%  respiratory variability, suggesting right atrial pressure of 3 mmHg.    Echo 05/29/20:  1. Left ventricular ejection fraction, by estimation, is 35 to 40%. The  left ventricle has moderately decreased function. The left ventricle  demonstrates regional wall motion abnormalities (see scoring  diagram/findings for description). Left ventricular   diastolic parameters are consistent with Grade I diastolic dysfunction  (impaired relaxation). There is severe hypokinesis of the left  ventricular, basal-mid inferoseptal wall, inferior wall and inferolateral  wall.   2. Right ventricular systolic function is normal. The right ventricular  size is normal. There is mildly elevated pulmonary artery systolic  pressure.   3. Left atrial size was mildly dilated.   4. The mitral valve is normal in structure. Trivial mitral valve  regurgitation.   5. The aortic valve is tricuspid. There is mild calcification of the  aortic valve. There is mild thickening of the aortic valve. Aortic valve  regurgitation is mild to moderate. Mild aortic valve sclerosis is present,  with no evidence of aortic valve  stenosis.    Echo 05/06/17: Study Conclusions   - Left ventricle: Diffuse hypokinesis slightly worse in inferior   wall. The cavity size  was mildly dilated. Systolic function was   mildly reduced. The estimated ejection fraction was in the range   of 45% to 50%. Doppler parameters are consistent with abnormal   left ventricular relaxation (grade 1 diastolic dysfunction). - Aortic valve: Severely calcified non coronary cusp. There was   moderate regurgitation. - Mitral valve: There was mild regurgitation. - Left atrium: The  atrium was mildly dilated. - Atrial septum: No defect or patent foramen ovale was identified.   Echo 01/03/17: Study Conclusions   - Left ventricle: The cavity size was normal. Wall thickness was   normal. The estimated ejection fraction was in the range of 10%   to 15%. Diffuse hypokinesis. DIastolic function is abnormal,   indeterminant grade. - Aortic valve: There was moderate regurgitation. Valve area (VTI):   1.08 cm^2. Valve area (Vmax): 1.5 cm^2. Valve area (Vmean): 1.5   cm^2. - Mitral valve: There was moderate regurgitation. - Left atrium: The atrium was mildly dilated. - Right ventricle: Systolic function was mildly to moderately   reduced. - Tricuspid valve: There was moderate regurgitation. - Pulmonary arteries: Systolic pressure was moderately increased.   PA peak pressure: 49 mm Hg (S).   LHC 01/05/17: Prox RCA to Mid RCA lesion, 99 %stenosed. Mid RCA lesion, 30 %stenosed. Dist RCA lesion, 40 %stenosed. RPDA lesion, 20 %stenosed. Prox Cx lesion, 100 %stenosed. Ost Ramus to Ramus lesion, 50 %stenosed. Ost 3rd Diag to 3rd Diag lesion, 30 %stenosed. Mid LAD lesion, 40 %stenosed. Ost LAD to Prox LAD lesion, 20 %stenosed. Ost LM to LM lesion, 40 %stenosed. Hemodynamic findings consistent with mild pulmonary hypertension.   1. Severe calcific disease in the proximal to mid RCA 2. 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 Moderate left main and mid LAD stenosis 4. Elevated filling pressures  Patient Profile     70 y.o. male with chronic systolic and diastolic heart  failure, medically managed obstructive CAD, hypertension, hyperlipidemia, mitral regurgitation, aortic regurgitation, and CKD 3 admitted with acute on chronic systolic and diastolic heart failure.    Assessment & Plan    # Acute on chronic systolic and diastolic heart failure: # Hypertension:  # Valvular heart disease: LVEF 25-30% down from 35-40% 05/2020. It has been as low as 10-15% in the past.  He was admitted with volume overload in the setting of drinking a lot of water.  He now understands that he should have no more than 2 L of fluid daily.  He should continue to limit sodium intake.  He has no ischemic symptoms though he has known ischemic cardiomyopathy not revascularized due to acute illness when diagnosed.  Clinically he has done quite well with medical therapy.  He also has moderate AR.  We will plan for outpatient stress MRI. This will help clarify viability and ischemia.  We can also assess the severity of his AR.  For now, continue medical management.  His volume status is very tricky.  Yesterday he appeared to be euvolemic, but his sodium was down to 130 and BNP was 2131.  Therefore he was given another dose of IV Lasix.  Today his BNP is 1797 but his creatinine is up to 1.69.  We will stop diuresis with IV Lasix.  He will be given Lasix 40 mg oral to take as needed for weight gain of 2 pounds in a day or 5 pounds in a week.  He will hold his evening dose of Entresto tonight and resume it tomorrow.  We will make sure that he gets a basic metabolic panel next week.  Wilder Glade was started this admission.  Metoprolol was increased but he gets headaches at 25 mg so we will reduce it to 12.5 mg daily.  I think he would benefit from being seen by advanced heart failure clinic as an outpatient.  # CAD:  # Hyperlipidemia:  Continue Repatha, aspirin, pravastatin and  metoprolol. Outpatient stress MRI as above.     [facility] will sign off.   Medication Recommendations:  Hold Entresto tonight.   Resume 9/12. Other recommendations (labs, testing, etc):  BMP in 1 week.  Outpatient stress MRI Follow up as an outpatient:  TOC after cardiac MRI.  Needs an outpatient Advanced HF Clinic consult   For questions or updates, please contact [place] Please consult [email or web address] for contact info under        Signed, Skeet Latch, MD  05/11/2021, 10:04 AM

## 2021-05-11 NOTE — Discharge Instructions (Signed)
Start taking Entresto from tomorrow 05/12/2021  Do not take [employer] You need blood work checked next week Limit water intake to 2 L/day limit sodium salt intake

## 2021-05-11 NOTE — TOC Transition Note (Signed)
Transition of Care [facility]) - CM/SW Discharge Note   Patient Details  Name: Collin Young MRN: [id number] Date of Birth: 1950/11/18  Transition of Care [facility]) CM/SW Contact:  Illene Regulus, LCSW Phone Number: 05/11/2021, 12:49 PM   Clinical Narrative:    Pt was reviewed for CHF referral, pt only has one admitted in last 6 months does not meet CHF criteria. Pt is being followed by Cardiology in Outpatient setting. TOC CSW sigh off.  Arlie Solomons.Ameet Sandy, MSW, [place]  Transitions of Care Clinical Social Worker I Direct Dial: [phone]  Fax: [phone] [email or web address]     Final next level of care: Home/Self Care Barriers to Discharge: No Barriers Identified   Patient Goals and CMS Choice        Discharge Placement                       Discharge Plan and Services                                     Social Determinants of Health (SDOH) Interventions     Readmission Risk Interventions No flowsheet data found.

## 2021-05-11 NOTE — Discharge Summary (Signed)
Physician Discharge Summary  Collin Young [id number] DOB: May 30, 1951 DOA: 05/07/2021  PCP: Darreld Mclean, MD  Admit date: 05/07/2021 Discharge date: 05/11/2021  Admitted From: Home Disposition: Home  Recommendations for Outpatient Follow-up:  Follow up with PCP in 1-2 weeks Please obtain BMP/CBC in one week Please follow up Dr. Oval Linsey Follow-up with the heart failure clinic Take Lasix 40 mg if weight increases by 2 pounds per day or 5 pounds in 1 week. Restart Entresto 05/12/2021 MRI stress to be done as outpatient Metoprolol dose decreased to 12.5 mg daily due to headache at the dose of 25 mg.  Home Health: None Equipment/Devices none  Discharge Condition: Stable CODE STATUS full code Diet recommendation: Cardiac diet low-salt diet 2 L fluid restriction Brief/Interim Summary: 70 year old male with history of CKD stage III A, systolic and diastolic heart failure hepatitis C CAD admitted with dyspnea on exertion shortness of breath found to have fluid overload by physical exam and chest x-ray and CT of the chest with bilateral pleural effusion and elevated BNP of 3899.   Discharge Diagnoses:  Principal Problem:   Acute on chronic combined systolic and diastolic CHF (congestive heart failure) (HCC) Active Problems:   Coronary artery disease involving native coronary artery of native heart   Chronic kidney disease, stage 3a (HCC)   GERD without esophagitis   Mixed hyperlipidemia   Hypertensive urgency   Elevated troponin level not due myocardial infarction    #1 acute on chronic combined diastolic systolic heart failure with further worsening of systolic function, in the setting of increased water intake.  He was seen by cardiology during this admission.  He was advised to limit sodium and water intake. Echo done this admission with further decrease in ejection fraction to 25 to 30% down from 35 to 40% a year ago. Patient admitted with orthopnea and dyspnea on  exertion. He was started on Farxiga during this admission. Plan is to get a stress MRI as an outpatient to clarify viability and ischemia and to assess the severity of aortic regurgitation.  Cardiology recommended to continue with medical management. He is advised to take Lasix 40 mg if he gains more than 2 pounds per day or more than 5 pounds per week. He will continue Entresto starting tomorrow. Metoprolol dose was decreased to 12.5 daily due to headaches he gets with 25 mg. He will follow-up with advanced heart failure clinic. He will get a BMP done next week. On the day of discharge his creatinine was 1.6 up from yesterday 1.3 after getting an 80 mg of Lasix.  His BNP was 1797 on the day of discharge from 2131 yesterday.     #2 hypertensive urgency continue metoprolol and Entresto improved since admission   #3 CKD stage III AAA creatinine 1.69 on the day of discharge he was asked to repeat a BMP next week at the office.   #4 GERD continue PPI   #5 hyperlipidemia on statin   #6 elevated troponin secondary to demand ischemia and CKD    Estimated body mass index is 19.93 kg/m as calculated from the following:   Height as of this encounter: '5\' 6"'$  (1.676 m).   Weight as of this encounter: 56 kg.  Discharge Instructions  Discharge Instructions     Diet - low sodium heart healthy   Complete by: As directed    Increase activity slowly   Complete by: As directed       Allergies as of 05/11/2021   No Known  Allergies      Medication List     TAKE these medications    albuterol 108 (90 Base) MCG/ACT inhaler Commonly known as: VENTOLIN HFA INHALE 2 PUFFS INTO THE LUNGS EVERY 6 HOURS AS NEEDED FOR WHEEZING OR SHORTNESS OF BREATH   aspirin EC 81 MG tablet Take 81 mg by mouth daily.   dapagliflozin propanediol 10 MG Tabs tablet Commonly known as: FARXIGA Take 1 tablet (10 mg total) by mouth daily. Start taking on: May 12, 2021   Entresto 24-26 MG Generic drug:  sacubitril-valsartan TAKE 1 TABLET BY MOUTH TWICE DAILY What changed: when to take this   fentaNYL 12 MCG/HR Commonly known as: [place] 1 patch onto the skin every 3 (three) days.   furosemide 40 MG tablet Commonly known as: Lasix Take 40 mg of Lasix if you gain weight more than 2 pounds per day or more than 5 pounds per week   metoprolol succinate 25 MG 24 hr tablet Commonly known as: TOPROL-XL Take 0.5 tablets (12.5 mg total) by mouth daily. Start taking on: May 12, 2021 What changed:  how much to take how to take this when to take this additional instructions   multivitamin tablet Take 1 tablet by mouth daily.   polyethylene glycol 17 g packet Commonly known as: MIRALAX / GLYCOLAX Take 17 g by mouth daily as needed for mild constipation.   pravastatin 80 MG tablet Commonly known as: PRAVACHOL Take 1 tablet (80 mg total) by mouth every evening.   Repatha SureClick [id number] MG/ML Soaj Generic drug: Evolocumab Inject 140 mg into the skin every 14 (fourteen) days.        Follow-up Information     Copland, Gay Filler, MD Follow up.   Specialty: Family Medicine Contact information: [address] [place] [address] [phone]         Skeet Latch, MD .   Specialty: Cardiology Contact information: [address] [place] [address] [phone]                No Known Allergies  Consultations: Dr Oval Linsey   Procedures/Studies: CT Angio Chest PE W/Cm &/Or Wo Cm  Result Date: 05/08/2021 CLINICAL DATA:  Chest pain, shortness of breath, elevated D-dimer EXAM: CT ANGIOGRAPHY CHEST WITH CONTRAST TECHNIQUE: Multidetector CT imaging of the chest was performed using the standard protocol during bolus administration of intravenous contrast. Multiplanar CT image reconstructions and MIPs were obtained to evaluate the vascular anatomy. CONTRAST:  33m OMNIPAQUE IOHEXOL 350 MG/ML SOLN COMPARISON:  01/10/2010  FINDINGS: Cardiovascular: Cardiomegaly. Diffuse coronary artery and aortic calcifications. No evidence of aortic aneurysm. Lack of contrast in the lower lobe pulmonary arteries bilaterally both the common unopacified at a similar level suggesting this may be related to poor cardiac output rather than true pulmonary embolus. Mediastinum/Nodes: No mediastinal, hilar, or axillary adenopathy. Trachea and esophagus are unremarkable. Thyroid unremarkable. Lungs/Pleura: Large right pleural effusion and small left pleural effusion. Bilateral lower lobe airspace opacities could reflect atelectasis or infiltrates/pneumonia. Upper Abdomen: Imaging into the upper abdomen demonstrates no acute findings. Musculoskeletal: Chest wall soft tissues are unremarkable. No acute bony abnormality. Review of the MIP images confirms the above findings. IMPRESSION: Unopacified lower lobe pulmonary arteries bilaterally. Given the symmetric appearance in both lower lobes, this could be related to poor cardiac output and unopacified vessels rather than true pulmonary emboli. Pulmonary emboli however remain in the differential. Cardiomegaly.  Coronary artery disease. Large right pleural effusion and small left  pleural effusion. Bilateral lower lobe atelectasis or infiltrate/pneumonia. Aortic Atherosclerosis (ICD10-I70.0). Electronically Signed   By: Rolm Baptise M.D.   On: 05/08/2021 00:08   DG Chest Portable 1 View  Result Date: 05/07/2021 CLINICAL DATA:  Dyspnea EXAM: PORTABLE CHEST 1 VIEW COMPARISON:  03/28/2021 FINDINGS: The lungs are symmetrically expanded. Small to moderate right and small left pleural effusions have developed. Mild bilateral perihilar interstitial pulmonary infiltrate has developed, asymmetrically more severe throughout the right lung, most in keeping with asymmetric pulmonary edema, less likely atypical infection. Cardiac size is mildly enlarged, new since prior examination. No pneumothorax. No acute bone abnormality.  IMPRESSION: Mild cardiomegaly, new since prior examination, with changes of probable mild cardiogenic failure with perihilar asymmetric interstitial pulmonary edema and bilateral pleural effusions, right greater than left. Electronically Signed   By: Fidela Salisbury M.D.   On: 05/07/2021 23:01   ECHOCARDIOGRAM COMPLETE  Result Date: 05/08/2021    ECHOCARDIOGRAM REPORT   Patient Name:   Collin Young Date of Exam: 05/08/2021 Medical Rec #:  [id number]        Height:       66.0 in Accession #:    [id number]       Weight:       135.8 lb Date of Birth:  22-Aug-1951        BSA:          1.696 m Patient Age:    43 years         BP:           157/90 mmHg Patient Gender: M                HR:           101 bpm. Exam Location:  Inpatient Procedure: 2D Echo, Cardiac Doppler and Color Doppler  Results communicated to Dr Rodena Piety at 12:45pm. Indications:    CHF-Acute Systolic [id number]  History:        Patient has prior history of Echocardiogram examinations, most                 recent 05/22/2020. CHF and Idiopathic CMP, CAD, COPD; Risk                 Factors:Dyslipidemia, Hypertension and ETOH.  Sonographer:    Bernadene Person RDCS Referring Phys: [id number] [place]  1. Left ventricular ejection fraction, by estimation, is 25 to 30%. The left ventricle has severely decreased function. The left ventricle demonstrates global hypokinesis. There is mild left ventricular hypertrophy. Left ventricular diastolic parameters  are indeterminate.  2. Right ventricular systolic function was not well visualized. The right ventricular size is not well visualized. There is normal pulmonary artery systolic pressure. The estimated right ventricular systolic pressure is 99991111 mmHg.  3. The mitral valve is abnormal. Moderate mitral valve regurgitation. Appears functional.  4. The aortic valve is tricuspid. Aortic valve regurgitation is moderate. Mild aortic valve sclerosis is present, with no evidence of aortic valve stenosis.  5.  The inferior vena cava is normal in size with greater than 50% respiratory variability, suggesting right atrial pressure of 3 mmHg. FINDINGS  Left Ventricle: Left ventricular ejection fraction, by estimation, is 25 to 30%. The left ventricle has severely decreased function. The left ventricle demonstrates global hypokinesis. The left ventricular internal cavity size was normal in size. There is mild left ventricular hypertrophy. Left ventricular diastolic parameters are indeterminate. Right Ventricle: The right ventricular size is not well visualized. Right vetricular wall  thickness was not well visualized. Right ventricular systolic function was not well visualized. There is normal pulmonary artery systolic pressure. The tricuspid regurgitant velocity is 2.65 m/s, and with an assumed right atrial pressure of 3 mmHg, the estimated right ventricular systolic pressure is 99991111 mmHg. Left Atrium: Left atrial size was normal in size. Right Atrium: Right atrial size was normal in size. Pericardium: There is no evidence of pericardial effusion. Mitral Valve: The mitral valve is abnormal. Moderate mitral valve regurgitation. Tricuspid Valve: The tricuspid valve is normal in structure. Tricuspid valve regurgitation is mild. Aortic Valve: The aortic valve is tricuspid. Aortic valve regurgitation is moderate. Aortic regurgitation PHT measures 291 msec. Mild aortic valve sclerosis is present, with no evidence of aortic valve stenosis. Pulmonic Valve: The pulmonic valve was not well visualized. Pulmonic valve regurgitation is trivial. Aorta: The aortic root and ascending aorta are structurally normal, with no evidence of dilitation. Venous: The inferior vena cava is normal in size with greater than 50% respiratory variability, suggesting right atrial pressure of 3 mmHg. IAS/Shunts: The interatrial septum was not well visualized. Additional Comments: There is a small pleural effusion in the left lateral region.  LEFT VENTRICLE  PLAX 2D LVIDd:         5.20 cm LVIDs:         4.50 cm LV PW:         1.10 cm LV IVS:        0.90 cm LVOT diam:     2.10 cm LV SV:         39 LV SV Index:   23 LVOT Area:     3.46 cm  LV Volumes (MOD) LV vol d, MOD A2C: 134.0 ml LV vol d, MOD A4C: 130.0 ml LV vol s, MOD A2C: 96.4 ml LV vol s, MOD A4C: 103.0 ml LV SV MOD A2C:     37.6 ml LV SV MOD A4C:     130.0 ml LV SV MOD BP:      32.8 ml RIGHT VENTRICLE RV S prime:     6.15 cm/s TAPSE (M-mode): 1.6 cm LEFT ATRIUM             Index       RIGHT ATRIUM           Index LA diam:        3.60 cm 2.12 cm/m  RA Area:     12.80 cm LA Vol (A2C):   46.1 ml 27.18 ml/m RA Volume:   30.60 ml  18.04 ml/m LA Vol (A4C):   43.0 ml 25.35 ml/m LA Biplane Vol: 48.4 ml 28.53 ml/m  AORTIC VALVE LVOT Vmax:         83.65 cm/s LVOT Vmean:        52.400 cm/s LVOT VTI:          0.112 m AI PHT:            291 msec AR Vena Contracta: 0.40 cm  AORTA Ao Root diam: 3.20 cm Ao Asc diam:  2.90 cm MR Peak grad:    110.7 mmHg  TRICUSPID VALVE MR Mean grad:    69.0 mmHg   TR Peak grad:   28.1 mmHg MR Vmax:         526.00 cm/s TR Vmax:        265.00 cm/s MR Vmean:        389.0 cm/s MR PISA:         0.57 cm  SHUNTS MR PISA Eff ROA: 4 mm       Systemic VTI:  0.11 m MR PISA Radius:  0.30 cm     Systemic Diam: 2.10 cm Oswaldo Milian MD Electronically signed by Oswaldo Milian MD Signature Date/Time: 05/08/2021/12:50:38 PM    Final    (Echo, Carotid, EGD, Colonoscopy, ERCP)    Subjective: Patient laying in bed in no acute distress he is anxious to go home he denies any chest pain or shortness of breath  Discharge Exam: Vitals:   05/10/21 1900 05/11/21 0512  BP: 128/77 133/70  Pulse: 82 82  Resp: 18 18  Temp: 98.3 F (36.8 C) 98 F (36.7 C)  SpO2: 94% 92%   Vitals:   05/10/21 1323 05/10/21 1900 05/11/21 0500 05/11/21 0512  BP: 126/77 128/77  133/70  Pulse: 80 82  82  Resp: '18 18  18  '$ Temp: 97.7 F (36.5 C) 98.3 F (36.8 C)  98 F (36.7 C)  TempSrc: Oral Oral   Oral  SpO2: 96% 94%  92%  Weight:   56 kg   Height:        General: Pt is alert, awake, not in acute distress Cardiovascular: RRR, S1/S2 +, no rubs, no gallops Respiratory: CTA bilaterally, no wheezing, no rhonchi Abdominal: Soft, NT, ND, bowel sounds + Extremities: no edema, no cyanosis    The results of significant diagnostics from this hospitalization (including imaging, microbiology, ancillary and laboratory) are listed below for reference.     Microbiology: Recent Results (from the past 240 hour(s))  Resp Panel by RT-PCR (Flu A&B, Covid) Nasopharyngeal Swab     Status: None   Collection Time: 05/07/21 11:00 PM   Specimen: Nasopharyngeal Swab; Nasopharyngeal(NP) swabs in vial transport medium  Result Value Ref Range Status   SARS Coronavirus 2 by RT PCR NEGATIVE NEGATIVE Final    Comment: (NOTE) SARS-CoV-2 target nucleic acids are NOT DETECTED.  The SARS-CoV-2 RNA is generally detectable in upper respiratory specimens during the acute phase of infection. The lowest concentration of SARS-CoV-2 viral copies this assay can detect is 138 copies/mL. A negative result does not preclude SARS-Cov-2 infection and should not be used as the sole basis for treatment or other patient management decisions. A negative result may occur with  improper specimen collection/handling, submission of specimen other than nasopharyngeal swab, presence of viral mutation(s) within the areas targeted by this assay, and inadequate number of viral copies(<138 copies/mL). A negative result must be combined with clinical observations, patient history, and epidemiological information. The expected result is Negative.  Fact Sheet for Patients:  EntrepreneurPulse.com.au  Fact Sheet for Healthcare Providers:  IncredibleEmployment.be  This test is no t yet approved or cleared by the [place] FDA and  has been authorized for detection and/or diagnosis of  SARS-CoV-2 by FDA under an Emergency Use Authorization (EUA). This EUA will remain  in effect (meaning this test can be used) for the duration of the COVID-19 declaration under Section 564(b)(1) of the Act, 21 U.S.C.section 360bbb-3(b)(1), unless the authorization is terminated  or revoked sooner.       Influenza A by PCR NEGATIVE NEGATIVE Final   Influenza B by PCR NEGATIVE NEGATIVE Final    Comment: (NOTE) The Xpert Xpress SARS-CoV-2/FLU/RSV plus assay is intended as an aid in the diagnosis of influenza from Nasopharyngeal swab specimens and should not be used as a sole basis for treatment. Nasal washings and aspirates are unacceptable for Xpert Xpress SARS-CoV-2/FLU/RSV testing.  Fact Sheet for Patients: EntrepreneurPulse.com.au  Fact Sheet for Healthcare Providers: IncredibleEmployment.be  This test is not yet approved or cleared by the [place] FDA and has been authorized for detection and/or diagnosis of SARS-CoV-2 by FDA under an Emergency Use Authorization (EUA). This EUA will remain in effect (meaning this test can be used) for the duration of the COVID-19 declaration under Section 564(b)(1) of the Act, 21 U.S.C. section 360bbb-3(b)(1), unless the authorization is terminated or revoked.  Performed at [facility], [place] [address], [place], [place] [address]      Labs: BNP (last 3 results) Recent Labs    05/07/21 2311 05/10/21 0514 05/11/21 0523  BNP 3,899.7* 2,131.0* [id number]*   Basic Metabolic Panel: Recent Labs  Lab 05/07/21 2308 05/08/21 1254 05/09/21 0748 05/10/21 0514 05/11/21 0523  NA 138 140 133* 130* 131*  K 4.7 3.9 3.9 3.7 3.9  CL 104 100 96* 92* 90*  CO2 '24 27 28 28 29  '$ GLUCOSE 107* 130* 102* 90 102*  BUN 22 23 29* 34* 39*  CREATININE 1.39* 1.33* 1.36* 1.38* 1.69*  CALCIUM 9.6 9.8 8.9 8.9 8.9  MG  --  2.3  --   --   --    Liver Function Tests: Recent Labs  Lab 05/07/21 2308  05/08/21 1254  AST 31 33  ALT 16 19  ALKPHOS 37* 45  BILITOT 0.6 0.7  PROT 7.5 8.7*  ALBUMIN 4.0 4.4   No results for input(s): LIPASE, AMYLASE in the last 168 hours. No results for input(s): AMMONIA in the last 168 hours. CBC: Recent Labs  Lab 05/07/21 2308 05/08/21 1254  WBC 3.8* 3.7*  NEUTROABS 2.1 2.7  HGB 13.5 15.3  HCT 42.1 48.4  MCV 86.4 85.5  PLT 193 234   Cardiac Enzymes: No results for input(s): CKTOTAL, CKMB, CKMBINDEX, TROPONINI in the last 168 hours. BNP: Invalid input(s): POCBNP CBG: No results for input(s): GLUCAP in the last 168 hours. D-Dimer No results for input(s): DDIMER in the last 72 hours. Hgb A1c No results for input(s): HGBA1C in the last 72 hours. Lipid Profile No results for input(s): CHOL, HDL, LDLCALC, TRIG, CHOLHDL, LDLDIRECT in the last 72 hours. Thyroid function studies No results for input(s): TSH, T4TOTAL, T3FREE, THYROIDAB in the last 72 hours.  Invalid input(s): FREET3 Anemia work up No results for input(s): VITAMINB12, FOLATE, FERRITIN, TIBC, IRON, RETICCTPCT in the last 72 hours. Urinalysis    Component Value Date/Time   COLORURINE YELLOW 05/07/2021 2345   APPEARANCEUR HAZY (A) 05/07/2021 2345   APPEARANCEUR Clear 05/29/2020 1556   LABSPEC 1.023 05/07/2021 2345   PHURINE 5.0 05/07/2021 2345   GLUCOSEU NEGATIVE 05/07/2021 2345   HGBUR NEGATIVE 05/07/2021 2345   BILIRUBINUR NEGATIVE 05/07/2021 2345   BILIRUBINUR Negative 05/29/2020 1556   KETONESUR NEGATIVE 05/07/2021 2345   PROTEINUR 100 (A) 05/07/2021 2345   UROBILINOGEN 1.0 08/04/2014 2007   NITRITE NEGATIVE 05/07/2021 2345   LEUKOCYTESUR TRACE (A) 05/07/2021 2345   Sepsis Labs Invalid input(s): PROCALCITONIN,  WBC,  LACTICIDVEN Microbiology Recent Results (from the past 240 hour(s))  Resp Panel by RT-PCR (Flu A&B, Covid) Nasopharyngeal Swab     Status: None   Collection Time: 05/07/21 11:00 PM   Specimen: Nasopharyngeal Swab; Nasopharyngeal(NP) swabs in vial  transport medium  Result Value Ref Range Status   SARS Coronavirus 2 by RT PCR NEGATIVE NEGATIVE Final    Comment: (NOTE) SARS-CoV-2 target nucleic acids are NOT DETECTED.  The SARS-CoV-2 RNA is generally detectable in upper respiratory specimens during the acute phase  of infection. The lowest concentration of SARS-CoV-2 viral copies this assay can detect is 138 copies/mL. A negative result does not preclude SARS-Cov-2 infection and should not be used as the sole basis for treatment or other patient management decisions. A negative result may occur with  improper specimen collection/handling, submission of specimen other than nasopharyngeal swab, presence of viral mutation(s) within the areas targeted by this assay, and inadequate number of viral copies(<138 copies/mL). A negative result must be combined with clinical observations, patient history, and epidemiological information. The expected result is Negative.  Fact Sheet for Patients:  EntrepreneurPulse.com.au  Fact Sheet for Healthcare Providers:  IncredibleEmployment.be  This test is no t yet approved or cleared by the [place] FDA and  has been authorized for detection and/or diagnosis of SARS-CoV-2 by FDA under an Emergency Use Authorization (EUA). This EUA will remain  in effect (meaning this test can be used) for the duration of the COVID-19 declaration under Section 564(b)(1) of the Act, 21 U.S.C.section 360bbb-3(b)(1), unless the authorization is terminated  or revoked sooner.       Influenza A by PCR NEGATIVE NEGATIVE Final   Influenza B by PCR NEGATIVE NEGATIVE Final    Comment: (NOTE) The Xpert Xpress SARS-CoV-2/FLU/RSV plus assay is intended as an aid in the diagnosis of influenza from Nasopharyngeal swab specimens and should not be used as a sole basis for treatment. Nasal washings and aspirates are unacceptable for Xpert Xpress SARS-CoV-2/FLU/RSV testing.  Fact  Sheet for Patients: EntrepreneurPulse.com.au  Fact Sheet for Healthcare Providers: IncredibleEmployment.be  This test is not yet approved or cleared by the [place] FDA and has been authorized for detection and/or diagnosis of SARS-CoV-2 by FDA under an Emergency Use Authorization (EUA). This EUA will remain in effect (meaning this test can be used) for the duration of the COVID-19 declaration under Section 564(b)(1) of the Act, 21 U.S.C. section 360bbb-3(b)(1), unless the authorization is terminated or revoked.  Performed at [facility], [place] [address], [place], [place] [address]      Time coordinating discharge: 39 minutes  SIGNED:   Georgette Shell, MD  [facility] 05/11/2021, 12:17 PM

## 2021-05-11 NOTE — Plan of Care (Signed)
?  Problem: Education: ?Goal: Ability to demonstrate management of disease process will improve ?Outcome: Progressing ?  ?Problem: Activity: ?Goal: Capacity to carry out activities will improve ?Outcome: Progressing ?  ?Problem: Cardiac: ?Goal: Ability to achieve and maintain adequate cardiopulmonary perfusion will improve ?Outcome: Progressing ?  ?

## 2021-05-11 NOTE — Plan of Care (Signed)
  Problem: Education: Goal: Ability to verbalize understanding of medication therapies will improve Outcome: Progressing   

## 2021-05-12 ENCOUNTER — Telehealth: Payer: Self-pay

## 2021-05-12 NOTE — Progress Notes (Addendum)
[facility] at [facility] [place], [place], [place] [address] [phone]  Date:  05/14/2021   Name:  Collin Young   DOB:  [birth year]   MRN:  [id number]  PCP:  Darreld Mclean, MD    Chief Complaint: Hospitalization Follow-up   History of Present Illness:  Collin Young is a 70 y.o. very pleasant male patient who presents with the following:  Pt seen today for hospital follow-up He got out of the hospital on Sunday-today is wednedsay He is feeling well Breathing is fine He notes that yesterday he was doing some things around the house and felt well- no SOB  Our scale today 130 lbs Pt notes his home weight is generally 127- at home today he was at 130 BNP was 1700 on 9/11- down from his high of nearly 4k He knows to take Lasix if his weight increases by 2 pounds.  I I do not believe he is taking Lasix daily otherwise, that this is a bit unclear  Overall Edi is feeling much improved Flu shot given today   Wt Readings from Last 3 Encounters:  05/14/21 130 lb (59 kg)  05/11/21 123 lb 7.3 oz (56 kg)  03/28/21 135 lb 12.8 oz (61.6 kg)     Admit date: 05/07/2021 Discharge date: 05/11/2021  Recommendations for Outpatient Follow-up:  Follow up with PCP in 1-2 weeks Please obtain BMP/CBC in one week Please follow up Dr. Oval Linsey Follow-up with the heart failure clinic Take Lasix 40 mg if weight increases by 2 pounds per day or 5 pounds in 1 week. Restart Entresto 05/12/2021 MRI stress to be done as outpatient Metoprolol dose decreased to 12.5 mg daily due to headache at the dose of 25 mg.   Home Health: None Equipment/Devices none   Discharge Condition: Stable CODE STATUS full code Diet recommendation: Cardiac diet low-salt diet 2 L fluid restriction Brief/Interim Summary: 70 year old male with history of CKD stage III A, systolic and diastolic heart failure hepatitis C CAD admitted with dyspnea on exertion  shortness of breath found to have fluid overload by physical exam and chest x-ray and CT of the chest with bilateral pleural effusion and elevated BNP of 3899.   Discharge Diagnoses:  Principal Problem:   Acute on chronic combined systolic and diastolic CHF (congestive heart failure) (HCC) Active Problems:   Coronary artery disease involving native coronary artery of native heart   Chronic kidney disease, stage 3a (HCC)   GERD without esophagitis   Mixed hyperlipidemia   Hypertensive urgency   Elevated troponin level not due myocardial infarction    #1 acute on chronic combined diastolic systolic heart failure with further worsening of systolic function, in the setting of increased water intake.  He was seen by cardiology during this admission.  He was advised to limit sodium and water intake. Echo done this admission with further decrease in ejection fraction to 25 to 30% down from 35 to 40% a year ago. Patient admitted with orthopnea and dyspnea on exertion. He was started on Farxiga during this admission. Plan is to get a stress MRI as an outpatient to clarify viability and ischemia and to assess the severity of aortic regurgitation.  Cardiology recommended to continue with medical management. He is advised to take Lasix 40 mg if he gains more than 2 pounds per day or more than 5 pounds per week. He will continue Entresto starting tomorrow. Metoprolol dose was decreased  to 12.5 daily due to headaches he gets with 25 mg. He will follow-up with advanced heart failure clinic. He will get a BMP done next week. On the day of discharge his creatinine was 1.6 up from yesterday 1.3 after getting an 80 mg of Lasix.  His BNP was 1797 on the day of discharge from 2131 yesterday.   #2 hypertensive urgency continue metoprolol and Entresto improved since admission   #3 CKD stage III AAA creatinine 1.69 on the day of discharge he was asked to repeat a BMP next week at the office.   #4 GERD continue  PPI   #5 hyperlipidemia on statin   #6 elevated troponin secondary to demand ischemia and CKD      Patient Active Problem List   Diagnosis Date Noted   GERD without esophagitis 05/08/2021   Mixed hyperlipidemia 05/08/2021   Hypertensive urgency 05/08/2021   Elevated troponin level not due myocardial infarction 05/08/2021   Pure hypercholesterolemia 09/23/2020   Claudication in peripheral vascular disease ([place]) 01/25/2020   Late latent syphilis 01/15/2020   Coronary artery disease involving native coronary artery of native heart 07/05/2019   Chronic kidney disease, stage 3a ([place]) 07/05/2019   Combined systolic and diastolic heart failure ([place]) 09/26/2018   Malignant neoplasm of prostate ([place]) 06/17/2018   Medication management    Dysphagia    Ischemic cardiomyopathy    Congestive dilated cardiomyopathy (HCC)    Acute on chronic combined systolic and diastolic CHF (congestive heart failure) ([place]) 01/02/2017   Malignant neoplasm of pyriform sinus ([place]) 09/28/2013   Piriform sinus tumor 09/24/2013   NONSPECIFIC ABN FINDING RAD & OTH EXAM GI TRACT 05/14/2009    Past Medical History:  Diagnosis Date   Chronic combined systolic and diastolic CHF (congestive heart failure) ([place])    followed by [name]   COPD (chronic obstructive pulmonary disease) ([place])    Coronary artery disease cardiologist-- [name]   per cardiac cath 01-05-2017  chronic total occlusion pLCx with collaterals, 99% severe calcified prox. to mid RCA, otherwise mild to moderate CAD (medically managed)   Esophageal cancer, stage IIIB [facility]) oncologist-  dr ennever/  dr moody   dx 2010  SCC Stage IIIB completed chemoradiation;  localized recurrent left piriform sinus 01/ 2015,  completed concurrent chemoradiatoin 04/ 2015   GERD (gastroesophageal reflux disease)    09-07-2018   no issues since Gtube removed 06/ 2019   Headache    History of alcohol abuse    quit 2001   History of cancer  chemotherapy    2010;   2015   History of radiation therapy    10-19-2013 to  12-05-2013 pyriform sinus 69.96 Gy/65f;   Radiation completed 2010 for esophageal cancer   History of seizure 2001   alcohol withdrawal   HOH (hard of hearing)    Hyperlipidemia    Hypertension    Ischemic cardiomyopathy 12/2016   01-03-2017  echo,  ef 10-15%/   echo 05-06-2017 EF improved to 45-50%   Lower urinary tract symptoms (LUTS)    Prostate cancer ([facility] urologist-  dr ottelin/  oncologist-- dr mTammi Klippel  dx 05-27-2018--- Stage T1c,  Gleason 4+3,  PSA 9.3--  scheduled for brachytherapy 09-23-2017   Pure hypercholesterolemia 09/23/2020   Renal cyst, left    Voice hoarseness    secondary to radiation treatment    Past Surgical History:  Procedure Laterality Date   CARDIOVASCULAR STRESS TEST  10/09/09   normal nuclearr stress test, EF  57% ([place])   IR GASTROSTOMY TUBE MOD SED  01/13/2017   IR GASTROSTOMY TUBE REMOVAL  02/03/2018   IR PATIENT EVAL TECH 0-60 MINS  03/25/2017   IR REMOVAL TUN ACCESS W/ PORT W/O FL MOD SED  01/15/2017   IR REPLACE G-TUBE SIMPLE WO FLUORO  01/13/2018   LARYNGOSCOPY N/A 09/15/2013   Procedure: LARYNGOSCOPY;  Surgeon: [name], MD;  Location: [facility];  Service: ENT;  Laterality: N/A;  direct laryngoscopy with biopsy and esophagoscopy   RADIOACTIVE SEED IMPLANT N/A 09/23/2018   Procedure: RADIOACTIVE SEED IMPLANT/BRACHYTHERAPY IMPLANT;  Surgeon: [name], MD;  Location: [facility];  Service: Urology;  Laterality: N/A;   RIGHT/LEFT HEART CATH AND CORONARY ANGIOGRAPHY N/A 01/05/2017   Procedure: Right/Left Heart Cath and Coronary Angiography;  Surgeon: [name], MD;  Location: [facility];  Service: Cardiovascular;  Laterality: N/A;   TRANSTHORACIC ECHOCARDIOGRAM  05/06/2017   ef 45-50%,  grade 1 diastolic dysfunction/  AV severe calcified non coronary cusp with moderate regurg. , no stenosis (valve area per echo 01-05-2017 1.08cm^2)/  mild  MR, TR, and PR/  mild LAE    Social History   Tobacco Use   Smoking status: Former    Packs/day: 1.00    Years: 40.00    Pack years: 40.00    Types: Cigarettes    Start date: 11/08/1960    Quit date: 08/31/2009    Years since quitting: 11.7   Smokeless tobacco: Former    Types: Chew    Quit date: 09/01/1999  Vaping Use   Vaping Use: Never used  Substance Use Topics   Alcohol use: Not Currently    Alcohol/week: 0.0 standard drinks    Comment: quit in 2001   Drug use: No    Comment: back in the day used cocaine,alcohol, marijuana    Family History  Problem Relation Age of Onset   Breast cancer Mother    Prostate cancer Neg Hx    Colon cancer Neg Hx    Pancreatic cancer Neg Hx     No Known Allergies  Medication list has been reviewed and updated.  Current Outpatient Medications on File Prior to Visit  Medication Sig Dispense Refill   albuterol (VENTOLIN HFA) 108 (90 Base) MCG/ACT inhaler INHALE 2 PUFFS INTO THE LUNGS EVERY 6 HOURS AS NEEDED FOR WHEEZING OR SHORTNESS OF BREATH 20.1 g 1   aspirin EC 81 MG tablet Take 81 mg by mouth daily.     dapagliflozin propanediol (FARXIGA) 10 MG TABS tablet Take 1 tablet (10 mg total) by mouth daily. 30 tablet 2   ENTRESTO 24-26 MG TAKE 1 TABLET BY MOUTH TWICE DAILY (Patient taking differently: Take 1 tablet by mouth daily.) 180 tablet 3   Evolocumab (REPATHA SURECLICK) [id number] MG/ML SOAJ Inject 140 mg into the skin every 14 (fourteen) days. 2 mL 11   fentaNYL (DURAGESIC) 12 MCG/HR Place 1 patch onto the skin every 3 (three) days. 10 patch 0   furosemide (LASIX) 40 MG tablet Take 40 mg of Lasix if you gain weight more than 2 pounds per day or more than 5 pounds per week 30 tablet 2   metoprolol succinate (TOPROL-XL) 25 MG 24 hr tablet Take 0.5 tablets (12.5 mg total) by mouth daily. 30 tablet 2   Multiple Vitamin (MULTIVITAMIN) tablet Take 1 tablet by mouth daily.     polyethylene glycol (MIRALAX / GLYCOLAX) 17 g packet Take 17 g by mouth  daily as needed for  mild constipation. 14 each 0   pravastatin (PRAVACHOL) 80 MG tablet Take 1 tablet (80 mg total) by mouth every evening. 90 tablet 3   Current Facility-Administered Medications on File Prior to Visit  Medication Dose Route Frequency Provider Last Rate Last Admin   topical emolient (BIAFINE) emulsion   Topical Daily Kyung Rudd, MD   Given at 01/19/14 [id number]    Review of Systems:  As per HPI- otherwise negative.   Physical Examination: Vitals:   05/14/21 0916  BP: 122/86  Pulse: 82  Resp: 17  Temp: (!) 97.2 F (36.2 C)  SpO2: 98%   Vitals:   05/14/21 0916  Weight: 130 lb (59 kg)  Height: '5\' 6"'$  (1.676 m)   Body mass index is 20.98 kg/m. Ideal Body Weight: Weight in (lb) to have BMI = 25: 154.6  GEN: no acute distress.  Looks well, slim build Hoarse voice as per his baseline HEENT: Atraumatic, Normocephalic.  Ears and Nose: No external deformity. CV: RRR, No M/G/R. No JVD. No thrill. No extra heart sounds. PULM: CTA B, no wheezes, crackles, rhonchi. No retractions. No resp. distress. No accessory muscle use. ABD: S, NT, ND. No rebound. No HSM. EXTR: No c/c/e- ankle show no edema PSYCH: Normally interactive. Conversant.    Assessment and Plan: Hospital discharge follow-up  Acute on chronic combined systolic and diastolic CHF (congestive heart failure) ([place]) - Plan: CBC, Basic metabolic panel, B Nat Peptide  CRI (chronic renal insufficiency), stage 3 (moderate) (HCC) - Plan: CBC, Basic metabolic panel  Immunization due - Plan: Flu vaccine HIGH DOSE PF (Fluzone High dose)  Patient seen today for hospital follow-up.  He was recently admitted with acute on chronic CHF.  He was diuresed and weight seems to be back to baseline. He is doing daily weights and understands plan to take Lasix for weight increase  Confirmed cardiology follow-up later this month  Will plan further follow- up pending labs. High-dose flu shot given  This visit occurred during  the SARS-CoV-2 public health emergency.  Safety protocols were in place, including screening questions prior to the visit, additional usage of staff PPE, and extensive cleaning of exam room while observing appropriate contact time as indicated for disinfecting solutions.   Signed Lamar Blinks, MD  Received labs as below.  His BNP has gone up again.  I will get in touch with his cardiologist for any interim advice Renal function is improved Message to patient  Results for orders placed or performed in visit on 05/14/21  CBC  Result Value Ref Range   WBC 4.2 4.0 - 10.5 K/uL   RBC 5.35 4.22 - 5.81 Mil/uL   Platelets 193.0 150.0 - 400.0 K/uL   Hemoglobin 14.7 13.0 - 17.0 g/dL   HCT 46.4 39.0 - 52.0 %   MCV 86.7 78.0 - 100.0 fl   MCHC 31.6 30.0 - 36.0 g/dL   RDW 15.5 11.5 - [id number] %  Basic metabolic panel  Result Value Ref Range   Sodium 133 (L) 135 - 145 mEq/L   Potassium 4.8 3.5 - 5.1 mEq/L   Chloride 96 96 - 112 mEq/L   CO2 30 19 - 32 mEq/L   Glucose, Bld 80 70 - 99 mg/dL   BUN 17 6 - 23 mg/dL   Creatinine, Ser 1.26 0.40 - 1.50 mg/dL   GFR 57.80 (L) >60.00 mL/min   Calcium 9.9 8.4 - 10.5 mg/dL  B Nat Peptide  Result Value Ref Range   Pro B Natriuretic peptide (  BNP) 4,378.0 (H) 0.0 - 100.0 pg/mL   Addnd 9/15 Queried his primary cardiologist Dr Oval Linsey about his BNP, her advice as follows:  He always looks so much better than his numbers do.  I think he should take lasix '40mg'$  x2 days and then on MWF.  Thanks,  Larose Kells his daughter Linus Orn who is his listed contact and gave her this info, she will call her dad and tell him now

## 2021-05-12 NOTE — Telephone Encounter (Signed)
Transition Care Management Follow-up Telephone Call Date of discharge and from where: 05/11/2021  [facility] How have you been since you were released from the hospital? Feeling better Any questions or concerns? No  Items Reviewed: Did the pt receive and understand the discharge instructions provided? Yes  Medications obtained and verified? Yes  Other? YES-  Did not weigh today. Reviewed importance of daily weights Any new allergies since your discharge? No  Dietary orders reviewed? Yes Do you have support at home? Yes  daughters  Home Care and Equipment/Supplies: Were home health services ordered? not applicable If so, what is the name of the agency?  Has the agency set up a time to come to the patient's home?  Were any new equipment or medical supplies ordered?   What is the name of the medical supply agency?  Were you able to get the supplies/equipment?  Do you have any questions related to the use of the equipment or supplies?   Functional Questionnaire: (I = Independent and D = Dependent) ADLs: I  Bathing/Dressing- I  Meal Prep- I  Eating- I  Maintaining continence- I  Transferring/Ambulation- I  Managing Meds- I  Follow up appointments reviewed:  PCP Hospital f/u appt confirmed? Yes  Scheduled to see Dr. Lorelei Pont on 05/14/2021 @ [facility] f/u appt confirmed? Yes    05/29/2021 at 140pm Are transportation arrangements needed? No  If their condition worsens, is the pt aware to call PCP or go to the Emergency Dept.? Yes Was the patient provided with contact information for the PCP's office or ED? Yes Was to pt encouraged to call back with questions or concerns? Yes  Tomasa Rand, RN, BSN, CEN [facility] [employer] [phone]

## 2021-05-14 ENCOUNTER — Other Ambulatory Visit: Payer: Self-pay

## 2021-05-14 ENCOUNTER — Ambulatory Visit (INDEPENDENT_AMBULATORY_CARE_PROVIDER_SITE_OTHER): Payer: Medicare Other | Admitting: Family Medicine

## 2021-05-14 ENCOUNTER — Encounter: Payer: Self-pay | Admitting: Family Medicine

## 2021-05-14 VITALS — BP 122/86 | HR 82 | Temp 97.2°F | Resp 17 | Ht 66.0 in | Wt 130.0 lb

## 2021-05-14 DIAGNOSIS — N183 Chronic kidney disease, stage 3 unspecified: Secondary | ICD-10-CM

## 2021-05-14 DIAGNOSIS — Z09 Encounter for follow-up examination after completed treatment for conditions other than malignant neoplasm: Secondary | ICD-10-CM | POA: Diagnosis not present

## 2021-05-14 DIAGNOSIS — I5043 Acute on chronic combined systolic (congestive) and diastolic (congestive) heart failure: Secondary | ICD-10-CM | POA: Diagnosis not present

## 2021-05-14 DIAGNOSIS — Z23 Encounter for immunization: Secondary | ICD-10-CM | POA: Diagnosis not present

## 2021-05-14 LAB — CBC
HCT: 46.4 % (ref 39.0–52.0)
Hemoglobin: 14.7 g/dL (ref 13.0–17.0)
MCHC: 31.6 g/dL (ref 30.0–36.0)
MCV: 86.7 fl (ref 78.0–100.0)
Platelets: 193 10*3/uL (ref 150.0–400.0)
RBC: 5.35 Mil/uL (ref 4.22–5.81)
RDW: 15.5 % (ref 11.5–15.5)
WBC: 4.2 10*3/uL (ref 4.0–10.5)

## 2021-05-14 LAB — BASIC METABOLIC PANEL
BUN: 17 mg/dL (ref 6–23)
CO2: 30 mEq/L (ref 19–32)
Calcium: 9.9 mg/dL (ref 8.4–10.5)
Chloride: 96 mEq/L (ref 96–112)
Creatinine, Ser: 1.26 mg/dL (ref 0.40–1.50)
GFR: 57.8 mL/min — ABNORMAL LOW (ref >60.00)
Glucose, Bld: 80 mg/dL (ref 70–99)
Potassium: 4.8 mEq/L (ref 3.5–5.1)
Sodium: 133 mEq/L — ABNORMAL LOW (ref 135–145)

## 2021-05-14 LAB — BRAIN NATRIURETIC PEPTIDE: Pro B Natriuretic peptide (BNP): 4378 pg/mL — ABNORMAL HIGH (ref 0.0–100.0)

## 2021-05-14 NOTE — Patient Instructions (Signed)
Good to see you again today- I am glad you are feeling better!    Please continue to weigh yourself every morning and write it down.  If you weight goes up by 2lbs or more please take your furosemide and let me or Dr Oval Linsey know

## 2021-05-15 ENCOUNTER — Other Ambulatory Visit: Payer: Self-pay | Admitting: *Deleted

## 2021-05-15 DIAGNOSIS — C61 Malignant neoplasm of prostate: Secondary | ICD-10-CM

## 2021-05-15 DIAGNOSIS — C12 Malignant neoplasm of pyriform sinus: Secondary | ICD-10-CM

## 2021-05-15 MED ORDER — FENTANYL 12 MCG/HR TD PT72: 1.0000 | MEDICATED_PATCH | TRANSDERMAL | 0 refills | Status: DC

## 2021-05-29 ENCOUNTER — Ambulatory Visit (HOSPITAL_BASED_OUTPATIENT_CLINIC_OR_DEPARTMENT_OTHER): Payer: Medicare Other | Admitting: Cardiovascular Disease

## 2021-05-29 ENCOUNTER — Telehealth (HOSPITAL_BASED_OUTPATIENT_CLINIC_OR_DEPARTMENT_OTHER): Payer: Self-pay | Admitting: Cardiovascular Disease

## 2021-05-29 NOTE — Telephone Encounter (Signed)
Spoke with daughter (on [place]) regarding new appointment date and time 06/26/21 at 11:40 am.

## 2021-06-02 ENCOUNTER — Emergency Department (HOSPITAL_COMMUNITY): Payer: Medicare Other

## 2021-06-02 ENCOUNTER — Inpatient Hospital Stay (HOSPITAL_BASED_OUTPATIENT_CLINIC_OR_DEPARTMENT_OTHER): Payer: Medicare Other

## 2021-06-02 ENCOUNTER — Other Ambulatory Visit: Payer: Self-pay

## 2021-06-02 ENCOUNTER — Observation Stay (HOSPITAL_COMMUNITY)
Admission: EM | Admit: 2021-06-02 | Discharge: 2021-06-03 | Disposition: A | Discharge status: Home / Self Care | Payer: Medicare Other | Attending: Internal Medicine | Admitting: Internal Medicine

## 2021-06-02 ENCOUNTER — Encounter (HOSPITAL_COMMUNITY): Payer: Self-pay | Admitting: Internal Medicine

## 2021-06-02 DIAGNOSIS — R0600 Dyspnea, unspecified: Secondary | ICD-10-CM | POA: Diagnosis not present

## 2021-06-02 DIAGNOSIS — I130 Hypertensive heart and chronic kidney disease with heart failure and stage 1 through stage 4 chronic kidney disease, or unspecified chronic kidney disease: Secondary | ICD-10-CM | POA: Insufficient documentation

## 2021-06-02 DIAGNOSIS — Z8546 Personal history of malignant neoplasm of prostate: Secondary | ICD-10-CM | POA: Insufficient documentation

## 2021-06-02 DIAGNOSIS — Z7982 Long term (current) use of aspirin: Secondary | ICD-10-CM | POA: Insufficient documentation

## 2021-06-02 DIAGNOSIS — I5021 Acute systolic (congestive) heart failure: Secondary | ICD-10-CM | POA: Diagnosis not present

## 2021-06-02 DIAGNOSIS — J449 Chronic obstructive pulmonary disease, unspecified: Secondary | ICD-10-CM | POA: Insufficient documentation

## 2021-06-02 DIAGNOSIS — I499 Cardiac arrhythmia, unspecified: Secondary | ICD-10-CM | POA: Diagnosis not present

## 2021-06-02 DIAGNOSIS — Z8501 Personal history of malignant neoplasm of esophagus: Secondary | ICD-10-CM | POA: Diagnosis not present

## 2021-06-02 DIAGNOSIS — N1831 Chronic kidney disease, stage 3a: Secondary | ICD-10-CM | POA: Insufficient documentation

## 2021-06-02 DIAGNOSIS — Z79899 Other long term (current) drug therapy: Secondary | ICD-10-CM | POA: Diagnosis not present

## 2021-06-02 DIAGNOSIS — I5023 Acute on chronic systolic (congestive) heart failure: Secondary | ICD-10-CM

## 2021-06-02 DIAGNOSIS — I5043 Acute on chronic combined systolic (congestive) and diastolic (congestive) heart failure: Secondary | ICD-10-CM | POA: Diagnosis not present

## 2021-06-02 DIAGNOSIS — J90 Pleural effusion, not elsewhere classified: Secondary | ICD-10-CM | POA: Diagnosis not present

## 2021-06-02 DIAGNOSIS — I2510 Atherosclerotic heart disease of native coronary artery without angina pectoris: Secondary | ICD-10-CM | POA: Insufficient documentation

## 2021-06-02 DIAGNOSIS — Z743 Need for continuous supervision: Secondary | ICD-10-CM | POA: Diagnosis not present

## 2021-06-02 DIAGNOSIS — Z20822 Contact with and (suspected) exposure to covid-19: Secondary | ICD-10-CM | POA: Diagnosis not present

## 2021-06-02 DIAGNOSIS — R6889 Other general symptoms and signs: Secondary | ICD-10-CM | POA: Diagnosis not present

## 2021-06-02 DIAGNOSIS — R0602 Shortness of breath: Secondary | ICD-10-CM | POA: Diagnosis not present

## 2021-06-02 DIAGNOSIS — I509 Heart failure, unspecified: Secondary | ICD-10-CM

## 2021-06-02 DIAGNOSIS — I110 Hypertensive heart disease with heart failure: Secondary | ICD-10-CM | POA: Diagnosis not present

## 2021-06-02 DIAGNOSIS — Z87891 Personal history of nicotine dependence: Secondary | ICD-10-CM | POA: Diagnosis not present

## 2021-06-02 LAB — ECHOCARDIOGRAM COMPLETE
Area-P 1/2: 2.82 cm2
Height: 66 in
MV M vel: 3.98 m/s
MV Peak grad: 63.4 mmHg
P 1/2 time: 316 msec
Radius: 0.5 cm
S' Lateral: 5.1 cm
Weight: 2080 oz

## 2021-06-02 LAB — TROPONIN I (HIGH SENSITIVITY)
Troponin I (High Sensitivity): 38 ng/L — ABNORMAL HIGH (ref <18)
Troponin I (High Sensitivity): 38 ng/L — ABNORMAL HIGH (ref <18)
Troponin I (High Sensitivity): 46 ng/L — ABNORMAL HIGH (ref <18)

## 2021-06-02 LAB — COMPREHENSIVE METABOLIC PANEL
ALT: 16 U/L (ref 0–44)
AST: 26 U/L (ref 15–41)
Albumin: 3.7 g/dL (ref 3.5–5.0)
Alkaline Phosphatase: 35 U/L — ABNORMAL LOW (ref 38–126)
Anion gap: 12 (ref 5–15)
BUN: 25 mg/dL — ABNORMAL HIGH (ref 8–23)
CO2: 25 mmol/L (ref 22–32)
Calcium: 9.2 mg/dL (ref 8.9–10.3)
Chloride: 99 mmol/L (ref 98–111)
Creatinine, Ser: 1.28 mg/dL — ABNORMAL HIGH (ref 0.61–1.24)
GFR, Estimated: >60 mL/min (ref >60)
Glucose, Bld: 107 mg/dL — ABNORMAL HIGH (ref 70–99)
Potassium: 4.5 mmol/L (ref 3.5–5.1)
Sodium: 136 mmol/L (ref 135–145)
Total Bilirubin: 0.5 mg/dL (ref 0.3–1.2)
Total Protein: 7.3 g/dL (ref 6.5–8.1)

## 2021-06-02 LAB — CBC
HCT: 42 % (ref 39.0–52.0)
Hemoglobin: 13.4 g/dL (ref 13.0–17.0)
MCH: 27.7 pg (ref 26.0–34.0)
MCHC: 31.9 g/dL (ref 30.0–36.0)
MCV: 87 fL (ref 80.0–100.0)
Platelets: 229 10*3/uL (ref 150–400)
RBC: 4.83 MIL/uL (ref 4.22–5.81)
RDW: 15.1 % (ref 11.5–15.5)
WBC: 4.4 10*3/uL (ref 4.0–10.5)
nRBC: 0 % (ref 0.0–0.2)

## 2021-06-02 LAB — CBC WITH DIFFERENTIAL/PLATELET
Abs Immature Granulocytes: 0.01 10*3/uL (ref 0.00–0.07)
Basophils Absolute: 0 10*3/uL (ref 0.0–0.1)
Basophils Relative: 0 %
Eosinophils Absolute: 0 10*3/uL (ref 0.0–0.5)
Eosinophils Relative: 0 %
HCT: 41.4 % (ref 39.0–52.0)
Hemoglobin: 13.4 g/dL (ref 13.0–17.0)
Immature Granulocytes: 0 %
Lymphocytes Relative: 19 %
Lymphs Abs: 0.8 10*3/uL (ref 0.7–4.0)
MCH: 27.9 pg (ref 26.0–34.0)
MCHC: 32.4 g/dL (ref 30.0–36.0)
MCV: 86.1 fL (ref 80.0–100.0)
Monocytes Absolute: 0.6 10*3/uL (ref 0.1–1.0)
Monocytes Relative: 14 %
Neutro Abs: 3.1 10*3/uL (ref 1.7–7.7)
Neutrophils Relative %: 67 %
Platelets: 215 10*3/uL (ref 150–400)
RBC: 4.81 MIL/uL (ref 4.22–5.81)
RDW: 15 % (ref 11.5–15.5)
WBC: 4.5 10*3/uL (ref 4.0–10.5)
nRBC: 0 % (ref 0.0–0.2)

## 2021-06-02 LAB — RESP PANEL BY RT-PCR (FLU A&B, COVID) ARPGX2
Influenza A by PCR: NEGATIVE
Influenza B by PCR: NEGATIVE
SARS Coronavirus 2 by RT PCR: NEGATIVE

## 2021-06-02 LAB — BRAIN NATRIURETIC PEPTIDE: B Natriuretic Peptide: 2342.9 pg/mL — ABNORMAL HIGH (ref 0.0–100.0)

## 2021-06-02 MED ORDER — ENOXAPARIN SODIUM 40 MG/0.4ML IJ SOSY
40.0000 mg | PREFILLED_SYRINGE | INTRAMUSCULAR | Status: DC
  Administered 2021-06-03: 40 mg via SUBCUTANEOUS
  Filled 2021-06-02: qty 0.4

## 2021-06-02 MED ORDER — FUROSEMIDE 10 MG/ML IJ SOLN
40.0000 mg | Freq: Once | INTRAMUSCULAR | Status: AC
  Administered 2021-06-02: 40 mg via INTRAVENOUS
  Filled 2021-06-02: qty 4

## 2021-06-02 MED ORDER — FUROSEMIDE 10 MG/ML IJ SOLN
60.0000 mg | Freq: Two times a day (BID) | INTRAMUSCULAR | Status: DC
  Administered 2021-06-02 – 2021-06-03 (×2): 60 mg via INTRAVENOUS
  Filled 2021-06-02: qty 6
  Filled 2021-06-02: qty 8

## 2021-06-02 MED ORDER — ACETAMINOPHEN 325 MG PO TABS
650.0000 mg | ORAL_TABLET | Freq: Four times a day (QID) | ORAL | Status: DC | PRN
  Administered 2021-06-02 – 2021-06-03 (×2): 650 mg via ORAL
  Filled 2021-06-02 (×2): qty 2

## 2021-06-02 MED ORDER — ACETAMINOPHEN 650 MG RE SUPP: 650.0000 mg | Freq: Four times a day (QID) | RECTAL | Status: DC | PRN

## 2021-06-02 NOTE — ED Provider Notes (Signed)
[place] DEPT Provider Note   CSN: [id number] Arrival date & time: 06/02/21  0216     History Chief Complaint  Patient presents with   Shortness of Breath    Collin Young is a 70 y.o. male.  HPI 70 year old male presents with dyspnea.  He always has some degree of dyspnea but is acutely worse starting this morning.  Has been more dyspneic throughout the day.  He chronically has a little bit of a cough here and there but that is not different than typical.  Tried his inhaler but it did not help.  Called EMS and was brought here.  He denies any swelling though this does feel similar to when he was admitted last month for CHF.  He reports compliance with the Lasix.  No chest pain or fever.  Past Medical History:  Diagnosis Date   Chronic combined systolic and diastolic CHF (congestive heart failure) ([place])    followed by [name]   COPD (chronic obstructive pulmonary disease) ([place])    Coronary artery disease cardiologist-- [name]   per cardiac cath 01-05-2017  chronic total occlusion pLCx with collaterals, 99% severe calcified prox. to mid RCA, otherwise mild to moderate CAD (medically managed)   Esophageal cancer, stage IIIB [facility]) oncologist-  dr ennever/  dr moody   dx 2010  SCC Stage IIIB completed chemoradiation;  localized recurrent left piriform sinus 01/ 2015,  completed concurrent chemoradiatoin 04/ 2015   GERD (gastroesophageal reflux disease)    09-07-2018   no issues since Gtube removed 06/ 2019   Headache    History of alcohol abuse    quit 2001   History of cancer chemotherapy    2010;   2015   History of radiation therapy    10-19-2013 to  12-05-2013 pyriform sinus 69.96 Gy/71fx;   Radiation completed 2010 for esophageal cancer   History of seizure 2001   alcohol withdrawal   HOH (hard of hearing)    Hyperlipidemia    Hypertension    Ischemic cardiomyopathy 12/2016   01-03-2017  echo,  ef 10-15%/   echo  05-06-2017 EF improved to 45-50%   Lower urinary tract symptoms (LUTS)    Prostate cancer [facility]) urologist-  dr ottelin/  oncologist-- [name]   dx 05-27-2018--- Stage T1c,  Gleason 4+3,  PSA 9.3--  scheduled for brachytherapy 09-23-2017   Pure hypercholesterolemia 09/23/2020   Renal cyst, left    Voice hoarseness    secondary to radiation treatment    Patient Active Problem List   Diagnosis Date Noted   GERD without esophagitis 05/08/2021   Mixed hyperlipidemia 05/08/2021   Hypertensive urgency 05/08/2021   Elevated troponin level not due myocardial infarction 05/08/2021   Pure hypercholesterolemia 09/23/2020   Claudication in peripheral vascular disease ([place]) 01/25/2020   Late latent syphilis 01/15/2020   Coronary artery disease involving native coronary artery of native heart 07/05/2019   Chronic kidney disease, stage 3a ([place]) 07/05/2019   Combined systolic and diastolic heart failure ([place]) 09/26/2018   Malignant neoplasm of prostate ([place]) 06/17/2018   Medication management    Dysphagia    Ischemic cardiomyopathy    Congestive dilated cardiomyopathy ([place])    Acute on chronic combined systolic and diastolic CHF (congestive heart failure) ([place]) 01/02/2017   Malignant neoplasm of pyriform sinus ([place]) 09/28/2013   Piriform sinus tumor 09/24/2013   NONSPECIFIC ABN FINDING RAD & OTH EXAM GI TRACT 05/14/2009    Past Surgical  History:  Procedure Laterality Date   CARDIOVASCULAR STRESS TEST  10/09/09   normal nuclearr stress test, EF 57% ([place])   IR GASTROSTOMY TUBE MOD SED  01/13/2017   IR GASTROSTOMY TUBE REMOVAL  02/03/2018   IR PATIENT EVAL TECH 0-60 MINS  03/25/2017   IR REMOVAL TUN ACCESS W/ PORT W/O FL MOD SED  01/15/2017   IR REPLACE G-TUBE SIMPLE WO FLUORO  01/13/2018   LARYNGOSCOPY N/A 09/15/2013   Procedure: LARYNGOSCOPY;  Surgeon: [name], MD;  Location: [place];  Service: ENT;  Laterality: N/A;  direct laryngoscopy with biopsy and esophagoscopy   RADIOACTIVE SEED  IMPLANT N/A 09/23/2018   Procedure: RADIOACTIVE SEED IMPLANT/BRACHYTHERAPY IMPLANT;  Surgeon: [name], MD;  Location: [facility];  Service: Urology;  Laterality: N/A;   RIGHT/LEFT HEART CATH AND CORONARY ANGIOGRAPHY N/A 01/05/2017   Procedure: Right/Left Heart Cath and Coronary Angiography;  Surgeon: [name], MD;  Location: [place] CV LAB;  Service: Cardiovascular;  Laterality: N/A;   TRANSTHORACIC ECHOCARDIOGRAM  05/06/2017   ef 45-50%,  grade 1 diastolic dysfunction/  AV severe calcified non coronary cusp with moderate regurg. , no stenosis (valve area per echo 01-05-2017 1.08cm^2)/  mild MR, TR, and PR/  mild LAE       Family History  Problem Relation Age of Onset   Breast cancer Mother    Prostate cancer Neg Hx    Colon cancer Neg Hx    Pancreatic cancer Neg Hx     Social History   Tobacco Use   Smoking status: Former    Packs/day: 1.00    Years: 40.00    Pack years: 40.00    Types: Cigarettes    Start date: 11/08/1960    Quit date: 08/31/2009    Years since quitting: 11.7   Smokeless tobacco: Former    Types: Chew    Quit date: 09/01/1999  Vaping Use   Vaping Use: Never used  Substance Use Topics   Alcohol use: Not Currently    Alcohol/week: 0.0 standard drinks    Comment: quit in 2001   Drug use: No    Comment: back in the day used cocaine,alcohol, marijuana    Home Medications Prior to Admission medications   Medication Sig Start Date End Date Taking? Authorizing Provider  albuterol (VENTOLIN HFA) 108 (90 Base) MCG/ACT inhaler INHALE 2 PUFFS INTO THE LUNGS EVERY 6 HOURS AS NEEDED FOR WHEEZING OR SHORTNESS OF BREATH 03/28/21   Copland, Gay Filler, MD  aspirin EC 81 MG tablet Take 81 mg by mouth daily.    [provider]  dapagliflozin propanediol (FARXIGA) 10 MG TABS tablet Take 1 tablet (10 mg total) by mouth daily. 05/12/21   Georgette Shell, MD  ENTRESTO 24-26 MG TAKE 1 TABLET BY MOUTH TWICE DAILY Patient taking  differently: Take 1 tablet by mouth daily. 11/11/20   Skeet Latch, MD  Evolocumab (REPATHA SURECLICK) 751 MG/ML SOAJ Inject 140 mg into the skin every 14 (fourteen) days. 11/14/20   Skeet Latch, MD  fentaNYL (DURAGESIC) 12 MCG/HR Place 1 patch onto the skin every 3 (three) days. 05/15/21   [name], MD  furosemide (LASIX) 40 MG tablet Take 40 mg of Lasix if you gain weight more than 2 pounds per day or more than 5 pounds per week 05/11/21   Georgette Shell, MD  metoprolol succinate (TOPROL-XL) 25 MG 24 hr tablet Take 0.5 tablets (12.5 mg total) by mouth daily. 05/12/21   Rodena Piety,  Noland Fordyce, MD  Multiple Vitamin (MULTIVITAMIN) tablet Take 1 tablet by mouth daily.    [provider]  polyethylene glycol (MIRALAX / GLYCOLAX) 17 g packet Take 17 g by mouth daily as needed for mild constipation. 05/11/21   Georgette Shell, MD  pravastatin (PRAVACHOL) 80 MG tablet Take 1 tablet (80 mg total) by mouth every evening. 04/30/21 07/29/21  Skeet Latch, MD    Allergies    Patient has no known allergies.  Review of Systems   Review of Systems  Constitutional:  Negative for fever.  HENT:  Positive for congestion.   Respiratory:  Positive for cough and shortness of breath. Negative for wheezing.   Cardiovascular:  Negative for chest pain and leg swelling.  Gastrointestinal:  Negative for abdominal distention.  All other systems reviewed and are negative.  Physical Exam Updated Vital Signs BP (!) 152/102   Pulse 100   Temp (!) 97.3 F (36.3 C) (Oral)   Resp 20   Ht 5\' 6"  (1.676 m)   Wt 59 kg   SpO2 98%   BMI 20.98 kg/m   Physical Exam Vitals and nursing note reviewed.  Constitutional:      Appearance: He is well-developed.  HENT:     Head: Normocephalic and atraumatic.     Right Ear: External ear normal.     Left Ear: External ear normal.     Nose: Nose normal.  Eyes:     General:        Right eye: No discharge.        Left eye: No discharge.   Cardiovascular:     Rate and Rhythm: Normal rate and regular rhythm.     Heart sounds: Normal heart sounds.  Pulmonary:     Effort: Pulmonary effort is normal. No tachypnea or accessory muscle usage.     Breath sounds: Examination of the right-lower field reveals decreased breath sounds. Examination of the left-lower field reveals decreased breath sounds and rales. Decreased breath sounds and rales present.  Abdominal:     General: There is no distension.     Palpations: Abdomen is soft.  Musculoskeletal:     Cervical back: Neck supple.     Right lower leg: No edema.     Left lower leg: No edema.  Skin:    General: Skin is warm and dry.  Neurological:     Mental Status: He is alert.  Psychiatric:        Mood and Affect: Mood is not anxious.    ED Results / Procedures / Treatments   Labs (all labs ordered are listed, but only abnormal results are displayed) Labs Reviewed  BRAIN NATRIURETIC PEPTIDE - Abnormal; Notable for the following components:      Result Value   B Natriuretic Peptide 2,342.9 (*)    All other components within normal limits  COMPREHENSIVE METABOLIC PANEL - Abnormal; Notable for the following components:   Glucose, Bld 107 (*)    BUN 25 (*)    Creatinine, Ser 1.28 (*)    Alkaline Phosphatase 35 (*)    All other components within normal limits  TROPONIN I (HIGH SENSITIVITY) - Abnormal; Notable for the following components:   Troponin I (High Sensitivity) 38 (*)    All other components within normal limits  TROPONIN I (HIGH SENSITIVITY) - Abnormal; Notable for the following components:   Troponin I (High Sensitivity) 38 (*)    All other components within normal limits  RESP PANEL BY RT-PCR (FLU  A&B, COVID) ARPGX2  CBC WITH DIFFERENTIAL/PLATELET  CBC  CBC WITH DIFFERENTIAL/PLATELET  TROPONIN I (HIGH SENSITIVITY)    EKG EKG Interpretation  Date/Time:  Monday June 02 2021 02:26:18 EDT Ventricular Rate:  100 PR Interval:  212 QRS  Duration: 106 QT Interval:  379 QTC Calculation: 489 R Axis:   -40 Text Interpretation: Sinus tachycardia Atrial premature complex Borderline prolonged PR interval Probable left atrial enlargement Left axis deviation Abnormal R-wave progression, late transition Nonspecific T abnormalities, lateral leads  similar to Sept 2022 Confirmed by Sherwood Gambler [phone]) on 06/02/2021 3:10:07 AM  Radiology DG Chest 2 View  Result Date: 06/02/2021 CLINICAL DATA:  Dyspnea EXAM: CHEST - 2 VIEW COMPARISON:  05/07/2021 FINDINGS: Check shadow is enlarged but stable. Aortic calcifications are again seen. Right-sided pleural effusion is noted stable from the prior exam. Some persistent airspace opacity is noted in the right base although improved from the prior study. Small left effusion is noted as well. IMPRESSION: Bilateral pleural effusions right greater than left. Persistent but improved airspace opacity in the right base. Electronically Signed   By: Inez Catalina M.D.   On: 06/02/2021 03:52    Procedures Procedures   Medications Ordered in ED Medications  furosemide (LASIX) injection 40 mg (40 mg Intravenous Given 06/02/21 7341)    ED Course  I have reviewed the triage vital signs and the nursing notes.  Pertinent labs & imaging results that were available during my care of the patient were reviewed by me and considered in my medical decision making (see chart for details).    MDM Rules/Calculators/A&P                           Patient keeps having intermittent episodes of mild hypoxia and now is requiring 2 L oxygen.  He is nondistressed.  Overall I suspect this is recurrent CHF exacerbation and he will be given IV diuresis.  Troponins are mildly elevated and flat.  Unlikely to be ACS.  Doubt PE.  Discussed with Dr. Alcario Drought for admission. Final Clinical Impression(s) / ED Diagnoses Final diagnoses:  Acute on chronic systolic congestive heart failure [facility])    Rx / DC Orders ED Discharge Orders      None        Sherwood Gambler, MD 06/02/21 [phone]

## 2021-06-02 NOTE — H&P (Signed)
History and Physical    Collin Young CWU:[id number] DOB: [birth year] DOA: 06/02/2021  PCP: Darreld Mclean, MD  Patient coming from: home  Chief Complaint: shortness of breath  HPI: Collin Young is a 70 y.o. male with medical history significant of combined systolic/diastolic HF, esophageal cancer, prostate cancer, HLD. Presenting with dyspnea. He really noticed his symptoms yesterday. He was short of breath all day. He tried his inhalers, but they didn't help. He didn't notice any chest pain or edema. He was short of breath at rest, and worse when he was lying down. His symptoms continued through the night, so he decided to come to the ED. He denies any other aggravating or alleviating factors.  ED Course: BNP was elevated to 2342. CXR was positive for b/l pleural effusions and improving opacities. He was afebrile. White count was normal. He was started on lasix. TRH was called for admission.   Review of Systems:  Denies CP, palpitations, N/V/D, cough, sick contacts, fevers. Review of systems is otherwise negative for all not mentioned in HPI.   PMHx Past Medical History:  Diagnosis Date   Chronic combined systolic and diastolic CHF (congestive heart failure) ([place])    followed by [name]   COPD (chronic obstructive pulmonary disease) ([place])    Coronary artery disease cardiologist-- [name]   per cardiac cath 01-05-2017  chronic total occlusion pLCx with collaterals, 99% severe calcified prox. to mid RCA, otherwise mild to moderate CAD (medically managed)   Esophageal cancer, stage IIIB [facility]) oncologist-  dr ennever/  dr moody   dx 2010  SCC Stage IIIB completed chemoradiation;  localized recurrent left piriform sinus 01/ 2015,  completed concurrent chemoradiatoin 04/ 2015   GERD (gastroesophageal reflux disease)    09-07-2018   no issues since Gtube removed 06/ 2019   Headache    History of alcohol abuse    quit 2001   History of cancer chemotherapy     2010;   2015   History of radiation therapy    10-19-2013 to  12-05-2013 pyriform sinus 69.96 Gy/4fx;   Radiation completed 2010 for esophageal cancer   History of seizure 2001   alcohol withdrawal   HOH (hard of hearing)    Hyperlipidemia    Hypertension    Ischemic cardiomyopathy 12/2016   01-03-2017  echo,  ef 10-15%/   echo 05-06-2017 EF improved to 45-50%   Lower urinary tract symptoms (LUTS)    Prostate cancer [facility]) urologist-  dr ottelin/  oncologist-- [name]   dx 05-27-2018--- Stage T1c,  Gleason 4+3,  PSA 9.3--  scheduled for brachytherapy 09-23-2017   Pure hypercholesterolemia 09/23/2020   Renal cyst, left    Voice hoarseness    secondary to radiation treatment    PSHx Past Surgical History:  Procedure Laterality Date   CARDIOVASCULAR STRESS TEST  10/09/09   normal nuclearr stress test, EF 57% ([place])   IR GASTROSTOMY TUBE MOD SED  01/13/2017   IR GASTROSTOMY TUBE REMOVAL  02/03/2018   IR PATIENT EVAL TECH 0-60 MINS  03/25/2017   IR REMOVAL TUN ACCESS W/ PORT W/O FL MOD SED  01/15/2017   IR REPLACE G-TUBE SIMPLE WO FLUORO  01/13/2018   LARYNGOSCOPY N/A 09/15/2013   Procedure: LARYNGOSCOPY;  Surgeon: [name], MD;  Location: [place];  Service: ENT;  Laterality: N/A;  direct laryngoscopy with biopsy and esophagoscopy   RADIOACTIVE SEED IMPLANT N/A 09/23/2018   Procedure: RADIOACTIVE SEED IMPLANT/BRACHYTHERAPY IMPLANT;  Surgeon:  [name], MD;  Location: [facility];  Service: Urology;  Laterality: N/A;   RIGHT/LEFT HEART CATH AND CORONARY ANGIOGRAPHY N/A 01/05/2017   Procedure: Right/Left Heart Cath and Coronary Angiography;  Surgeon: [name], MD;  Location: [place] CV LAB;  Service: Cardiovascular;  Laterality: N/A;   TRANSTHORACIC ECHOCARDIOGRAM  05/06/2017   ef 45-50%,  grade 1 diastolic dysfunction/  AV severe calcified non coronary cusp with moderate regurg. , no stenosis (valve area per echo 01-05-2017 1.08cm^2)/  mild MR, TR, and  PR/  mild LAE    SocHx  reports that he quit smoking about 11 years ago. His smoking use included cigarettes. He started smoking about 60 years ago. He has a 40.00 pack-year smoking history. He quit smokeless tobacco use about 21 years ago.  His smokeless tobacco use included chew. He reports that he does not currently use alcohol. He reports that he does not use drugs.  No Known Allergies  FamHx Family History  Problem Relation Age of Onset   Breast cancer Mother    Prostate cancer Neg Hx    Colon cancer Neg Hx    Pancreatic cancer Neg Hx     Prior to Admission medications   Medication Sig Start Date End Date Taking? Authorizing Provider  albuterol (VENTOLIN HFA) 108 (90 Base) MCG/ACT inhaler INHALE 2 PUFFS INTO THE LUNGS EVERY 6 HOURS AS NEEDED FOR WHEEZING OR SHORTNESS OF BREATH 03/28/21   Copland, Gay Filler, MD  aspirin EC 81 MG tablet Take 81 mg by mouth daily.    [provider]  dapagliflozin propanediol (FARXIGA) 10 MG TABS tablet Take 1 tablet (10 mg total) by mouth daily. 05/12/21   Georgette Shell, MD  ENTRESTO 24-26 MG TAKE 1 TABLET BY MOUTH TWICE DAILY Patient taking differently: Take 1 tablet by mouth daily. 11/11/20   Skeet Latch, MD  Evolocumab (REPATHA SURECLICK) 326 MG/ML SOAJ Inject 140 mg into the skin every 14 (fourteen) days. 11/14/20   Skeet Latch, MD  fentaNYL (DURAGESIC) 12 MCG/HR Place 1 patch onto the skin every 3 (three) days. 05/15/21   [name], MD  furosemide (LASIX) 40 MG tablet Take 40 mg of Lasix if you gain weight more than 2 pounds per day or more than 5 pounds per week 05/11/21   Georgette Shell, MD  metoprolol succinate (TOPROL-XL) 25 MG 24 hr tablet Take 0.5 tablets (12.5 mg total) by mouth daily. 05/12/21   Georgette Shell, MD  Multiple Vitamin (MULTIVITAMIN) tablet Take 1 tablet by mouth daily.    [provider]  polyethylene glycol (MIRALAX / GLYCOLAX) 17 g packet Take 17 g by mouth daily as needed  for mild constipation. 05/11/21   Georgette Shell, MD  pravastatin (PRAVACHOL) 80 MG tablet Take 1 tablet (80 mg total) by mouth every evening. 04/30/21 07/29/21  Skeet Latch, MD    Physical Exam: Vitals:   06/02/21 0645 06/02/21 0658 06/02/21 0700 06/02/21 0720  BP: (!) 152/102  (!) 150/95   Pulse: (!) 106 100 100   Resp: (!) 24 20 17    Temp:    97.7 F (36.5 C)  TempSrc:    Oral  SpO2: 100% 98% 91%   Weight:      Height:        General: 70 y.o. male resting in bed in NAD Eyes: PERRL, normal sclera ENMT: Nares patent w/o discharge, orophaynx clear, dentition normal, ears w/o discharge/lesions/ulcers Neck: Supple, trachea midline Cardiovascular: RRR, +S1,  S2, no m/g/r, equal pulses throughout Respiratory: decreased at bases no w/r/r, normal WOB GI: BS+, NDNT, no masses noted, no organomegaly noted MSK: No e/c/c Skin: No rashes, bruises, ulcerations noted Neuro: A&O x 3, no focal deficits Psyc: Appropriate interaction and affect, calm/cooperative  Labs on Admission: I have personally reviewed following labs and imaging studies  CBC: Recent Labs  Lab 06/02/21 0411 06/02/21 0507  WBC 4.5 4.4  NEUTROABS 3.1  --   HGB 13.4 13.4  HCT 41.4 42.0  MCV 86.1 87.0  PLT 215 272   Basic Metabolic Panel: Recent Labs  Lab 06/02/21 0507  NA 136  K 4.5  CL 99  CO2 25  GLUCOSE 107*  BUN 25*  CREATININE 1.28*  CALCIUM 9.2   GFR: Estimated Creatinine Clearance: 44.8 mL/min (A) (by C-G formula based on SCr of 1.28 mg/dL (H)). Liver Function Tests: Recent Labs  Lab 06/02/21 0507  AST 26  ALT 16  ALKPHOS 35*  BILITOT 0.5  PROT 7.3  ALBUMIN 3.7   No results for input(s): LIPASE, AMYLASE in the last 168 hours. No results for input(s): AMMONIA in the last 168 hours. Coagulation Profile: No results for input(s): INR, PROTIME in the last 168 hours. Cardiac Enzymes: No results for input(s): CKTOTAL, CKMB, CKMBINDEX, TROPONINI in the last 168 hours. BNP (last 3  results) Recent Labs    05/14/21 0935  PROBNP 4,378.0*   HbA1C: No results for input(s): HGBA1C in the last 72 hours. CBG: No results for input(s): GLUCAP in the last 168 hours. Lipid Profile: No results for input(s): CHOL, HDL, LDLCALC, TRIG, CHOLHDL, LDLDIRECT in the last 72 hours. Thyroid Function Tests: No results for input(s): TSH, T4TOTAL, FREET4, T3FREE, THYROIDAB in the last 72 hours. Anemia Panel: No results for input(s): VITAMINB12, FOLATE, FERRITIN, TIBC, IRON, RETICCTPCT in the last 72 hours. Urine analysis:    Component Value Date/Time   COLORURINE YELLOW 05/07/2021 2345   APPEARANCEUR HAZY (A) 05/07/2021 2345   APPEARANCEUR Clear 05/29/2020 1556   LABSPEC 1.023 05/07/2021 2345   PHURINE 5.0 05/07/2021 2345   GLUCOSEU NEGATIVE 05/07/2021 2345   HGBUR NEGATIVE 05/07/2021 2345   BILIRUBINUR NEGATIVE 05/07/2021 2345   BILIRUBINUR Negative 05/29/2020 [place] 05/07/2021 2345   PROTEINUR 100 (A) 05/07/2021 2345   UROBILINOGEN 1.0 08/04/2014 2007   NITRITE NEGATIVE 05/07/2021 2345   LEUKOCYTESUR TRACE (A) 05/07/2021 2345    Radiological Exams on Admission: DG Chest 2 View  Result Date: 06/02/2021 CLINICAL DATA:  Dyspnea EXAM: CHEST - 2 VIEW COMPARISON:  05/07/2021 FINDINGS: Check shadow is enlarged but stable. Aortic calcifications are again seen. Right-sided pleural effusion is noted stable from the prior exam. Some persistent airspace opacity is noted in the right base although improved from the prior study. Small left effusion is noted as well. IMPRESSION: Bilateral pleural effusions right greater than left. Persistent but improved airspace opacity in the right base. Electronically Signed   By: Inez Catalina M.D.   On: 06/02/2021 03:52    EKG: Independently reviewed. Sinus tach, no st elevations  Assessment/Plan Acute on chronic combined HF     - admit to inpt, tele     - continue lasix     - fluid restriction to 1200cc     - resume home  regimen otherwise     - echo (05/08/21) w/ EF 25-30%     - trp minimally elevated; flat; denies CP; EKG is ok  B/l pleural effusions     -  chronic effusions; no evidence of loculation     - continue with diuresis as above; can have these evaled outpt     - has some opacities on imaging that are improved from last; no fever and white count is normal; hold on abx for now  HLD     - continue home regimen  HTN     - resume home regimen  DVT prophylaxis: lovenox  Code Status: FULL  Family Communication: w/ dtr by phone Consults called: None   Status is: Inpatient  Remains inpatient appropriate because:Inpatient level of care appropriate due to severity of illness  Dispo: The patient is from: Home              Anticipated d/c is to: Home              Patient currently is not medically stable to d/c.   Difficult to place patient No  Jonnie Finner DO [facility]  If 7PM-7AM, please contact night-coverage [email or web address]  06/02/2021, 7:20 AM

## 2021-06-02 NOTE — ED Notes (Signed)
Pt provided meal tray

## 2021-06-02 NOTE — ED Notes (Signed)
Pt c/o increased SHOB. No change in O2 sats nor requirements. Lung sounds remain wet, comparable to earlier in the day. MD made aware, instructed to give lasix early.

## 2021-06-02 NOTE — ED Triage Notes (Signed)
PT [facility] from home. Pt reports respiratory distress starting today. Lung sounds clear without. Coughing up clear phlem. Pt recently started on Lasix and says he has been compliant with meds.

## 2021-06-02 NOTE — ED Notes (Signed)
LAB called and said pt needs recollect on purple and light green.

## 2021-06-03 DIAGNOSIS — I5043 Acute on chronic combined systolic (congestive) and diastolic (congestive) heart failure: Secondary | ICD-10-CM | POA: Diagnosis not present

## 2021-06-03 LAB — COMPREHENSIVE METABOLIC PANEL
ALT: 15 U/L (ref 0–44)
AST: 24 U/L (ref 15–41)
Albumin: 3.8 g/dL (ref 3.5–5.0)
Alkaline Phosphatase: 34 U/L — ABNORMAL LOW (ref 38–126)
Anion gap: 12 (ref 5–15)
BUN: 34 mg/dL — ABNORMAL HIGH (ref 8–23)
CO2: 26 mmol/L (ref 22–32)
Calcium: 9.7 mg/dL (ref 8.9–10.3)
Chloride: 97 mmol/L — ABNORMAL LOW (ref 98–111)
Creatinine, Ser: 1.48 mg/dL — ABNORMAL HIGH (ref 0.61–1.24)
GFR, Estimated: 51 mL/min — ABNORMAL LOW (ref >60)
Glucose, Bld: 101 mg/dL — ABNORMAL HIGH (ref 70–99)
Potassium: 4.2 mmol/L (ref 3.5–5.1)
Sodium: 135 mmol/L (ref 135–145)
Total Bilirubin: 0.6 mg/dL (ref 0.3–1.2)
Total Protein: 7.3 g/dL (ref 6.5–8.1)

## 2021-06-03 LAB — CBC
HCT: 41.9 % (ref 39.0–52.0)
Hemoglobin: 13.6 g/dL (ref 13.0–17.0)
MCH: 27.5 pg (ref 26.0–34.0)
MCHC: 32.5 g/dL (ref 30.0–36.0)
MCV: 84.6 fL (ref 80.0–100.0)
Platelets: 224 10*3/uL (ref 150–400)
RBC: 4.95 MIL/uL (ref 4.22–5.81)
RDW: 14.6 % (ref 11.5–15.5)
WBC: 4.6 10*3/uL (ref 4.0–10.5)
nRBC: 0 % (ref 0.0–0.2)

## 2021-06-03 MED ORDER — SACUBITRIL-VALSARTAN 24-26 MG PO TABS
1.0000 | ORAL_TABLET | Freq: Every day | ORAL | Status: DC
  Administered 2021-06-03: 1 via ORAL
  Filled 2021-06-03: qty 1

## 2021-06-03 MED ORDER — FUROSEMIDE 10 MG/ML IJ SOLN: 40.0000 mg | Freq: Every day | INTRAMUSCULAR | Status: DC

## 2021-06-03 MED ORDER — PRAVASTATIN SODIUM 40 MG PO TABS: 80.0000 mg | ORAL_TABLET | Freq: Every evening | ORAL | Status: DC

## 2021-06-03 MED ORDER — METOPROLOL SUCCINATE ER 25 MG PO TB24
12.5000 mg | ORAL_TABLET | Freq: Every day | ORAL | Status: DC
  Administered 2021-06-03: 12.5 mg via ORAL
  Filled 2021-06-03: qty 1

## 2021-06-03 NOTE — Care Management CC44 (Signed)
Condition Code 44 Documentation Completed  Patient Details  Name: Collin Young MRN: [id number] Date of Birth: 1951-08-02   Condition Code 44 given:  Yes Patient signature on Condition Code 44 notice:  Yes Documentation of 2 MD's agreement:  Yes Code 44 added to claim:  Yes    Leeroy Cha, RN 06/03/2021, 12:00 PM

## 2021-06-03 NOTE — Plan of Care (Signed)
  Problem: Clinical Measurements: Goal: Will remain free from infection Outcome: Progressing Goal: Diagnostic test results will improve Outcome: Progressing Goal: Respiratory complications will improve Outcome: Progressing Goal: Cardiovascular complication will be avoided Outcome: Progressing   

## 2021-06-03 NOTE — Care Management Obs Status (Signed)
[place] NOTIFICATION   Patient Details  Name: Collin Young MRN: [id number] Date of Birth: 1950-12-16   Medicare Observation Status Notification Given:  Yes    Leeroy Cha, RN 06/03/2021, 12:00 PM

## 2021-06-03 NOTE — Discharge Summary (Signed)
Physician Discharge Summary  ESDRAS DELAIR RAQ:[id number] DOB: [birth year] DOA: 06/02/2021  PCP: Darreld Mclean, MD  Admit date: 06/02/2021 Discharge date: 06/03/2021  Admitted From: Home Disposition: Home  Recommendations for Outpatient Follow-up:  Follow up with PCP in 1-2 weeks Follow-up with Dr. Oval Linsey as scheduled on June 26, 2021 rapid 10:40 AM Please obtain BMP in one week to assess renal function Recommend monitoring daily weights, and bring to next PCP/cardiology visit  Home Health: No Equipment/Devices: None  Discharge Condition: Stable CODE STATUS: Full code Diet recommendation: Heart healthy/consistent carbohydrate diet  History of present illness:  Collin Young is a 70 year old male with past medical history significant for chronic systolic and diastolic congestive heart failure, esophageal cancer, prostate cancer, hyperlipidemia, CKD stage IIIa who presents to [facility] ED on 10/3 with complaint of shortness of breath.  Patient attempted to use his home inhalers without any help.  Denies any chest pain or lower extremity edema.  Patient reports dyspnea worse when lying flat.  No other known exacerbating or alleviating factors.  In the ED, temperature 97.3 F, HR 97, RR 18, BP 154/92, SPO2 100% on room air.  Sodium 136, potassium 4.5, CO2 25, chloride 99, BUN 25, creatinine 1.28, glucose 107.  BNP 2342.  High-sensitivity troponin 38, 38, 46.  WBC 4.5, hemoglobin 13.4, platelets 215.  COVID-19 PCR negative.  Influenza A/B PCR negative.  Chest x-ray with bilateral pleural effusions, right greater than left, persistent but improved airspace opacity right base.EKG with sinus tachycardia, rate 100, QTc 49, no concerning dynamic changes.  EDP started on IV Lasix.  Duration consulted for further evaluation management of acute on chronic combined systolic/diastolic congestive heart failure exacerbation.   Hospital course:  Acute on chronic combined systolic/diastolic  congestive heart failure exacerbation Patient presenting to the ED with progressive shortness of breath, even at rest and worse while lying flat.  No significant improvement with his home inhalers.  BNP elevated on admission with chest x-ray with bilateral pleural effusions.  Patient was started on IV diuresis with furosemide 60 mg every 12 hours with improvement of his symptoms and was net -2.5 L during hospitalization.  Patient's weight on admission 60.6 kg down to 58.6 kg on discharge.  Patient feels that he is back at his normal baseline and ready for discharge home.  Patient has scheduled follow-up with his cardiologist, Dr. Oval Linsey on 06/26/2021 at 10:40 AM.  Patient will continue his home Lasix 40 mg p.o. as needed for weight gain of 2 pounds in a day or 5 pounds in a week.  Recommend daily weights.  Continue home Entresto, metoprolol, Farxiga.  Recommend repeat BMP 1 week to assess renal function.  Hyperlipidemia: Continue Repatha and pravastatin  Essential hypertension: Continue metoprolol and Entresto  CKD stage IIIa Baseline creatinine 1.3-1.4.  Creatinine admission 1.28 and up to 1.48 on discharge following aggressive IV diuresis.  Recommend repeat BMP 1 week.  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 Marin Olp   Discharge Diagnoses:  Active Problems:   Acute on chronic heart failure [facility])    Discharge Instructions  Discharge Instructions     Call MD for:  difficulty breathing, headache or visual disturbances   Complete by: As directed    Call MD for:  extreme fatigue   Complete by: As directed    Call MD for:  persistant dizziness or light-headedness   Complete by: As directed  Call MD for:  persistant nausea and vomiting   Complete by: As directed    Call MD for:  severe uncontrolled pain   Complete by: As directed    Call MD for:  temperature >100.4   Complete by: As  directed    Diet - low sodium heart healthy   Complete by: As directed    Increase activity slowly   Complete by: As directed       Allergies as of 06/03/2021   No Known Allergies      Medication List     TAKE these medications    albuterol 108 (90 Base) MCG/ACT inhaler Commonly known as: VENTOLIN HFA INHALE 2 PUFFS INTO THE LUNGS EVERY 6 HOURS AS NEEDED FOR WHEEZING OR SHORTNESS OF BREATH   aspirin EC 81 MG tablet Take 81 mg by mouth daily.   dapagliflozin propanediol 10 MG Tabs tablet Commonly known as: FARXIGA Take 1 tablet (10 mg total) by mouth daily.   Entresto 24-26 MG Generic drug: sacubitril-valsartan TAKE 1 TABLET BY MOUTH TWICE DAILY What changed: when to take this   fentaNYL 12 MCG/HR Commonly known as: [place]ett 1 patch onto the skin every 3 (three) days.   furosemide 40 MG tablet Commonly known as: Lasix Take 40 mg of Lasix if you gain weight more than 2 pounds per day or more than 5 pounds per week   metoprolol succinate 25 MG 24 hr tablet Commonly known as: TOPROL-XL Take 0.5 tablets (12.5 mg total) by mouth daily.   multivitamin tablet Take 1 tablet by mouth daily.   polyethylene glycol 17 g packet Commonly known as: MIRALAX / GLYCOLAX Take 17 g by mouth daily as needed for mild constipation.   pravastatin 80 MG tablet Commonly known as: PRAVACHOL Take 1 tablet (80 mg total) by mouth every evening.   Repatha SureClick 751 MG/ML Soaj Generic drug: Evolocumab Inject 140 mg into the skin every 14 (fourteen) days.        Follow-up Information     Copland, Gay Filler, MD. Schedule an appointment as soon as possible for a visit in 1 week(s).   Specialty: Family Medicine Why: Repeat BMP at visit Contact information: [address] [place] [address] [phone]         Skeet Latch, MD. Go on 06/26/2021.   Specialty: Cardiology Why: [id number] Contact information: [address] [place]  [place] [address] [phone]                No Known Allergies  Consultations: None   Procedures/Studies: DG Chest 2 View  Result Date: 06/02/2021 CLINICAL DATA:  Dyspnea EXAM: CHEST - 2 VIEW COMPARISON:  05/07/2021 FINDINGS: Check shadow is enlarged but stable. Aortic calcifications are again seen. Right-sided pleural effusion is noted stable from the prior exam. Some persistent airspace opacity is noted in the right base although improved from the prior study. Small left effusion is noted as well. IMPRESSION: Bilateral pleural effusions right greater than left. Persistent but improved airspace opacity in the right base. Electronically Signed   By: Inez Catalina M.D.   On: 06/02/2021 03:52   CT Angio Chest PE W/Cm &/Or Wo Cm  Result Date: 05/08/2021 CLINICAL DATA:  Chest pain, shortness of breath, elevated D-dimer EXAM: CT ANGIOGRAPHY CHEST WITH CONTRAST TECHNIQUE: Multidetector CT imaging of the chest was performed using the standard protocol during bolus administration of intravenous contrast. Multiplanar CT image reconstructions and MIPs were obtained to evaluate the  vascular anatomy. CONTRAST:  79mL OMNIPAQUE IOHEXOL 350 MG/ML SOLN COMPARISON:  01/10/2010 FINDINGS: Cardiovascular: Cardiomegaly. Diffuse coronary artery and aortic calcifications. No evidence of aortic aneurysm. Lack of contrast in the lower lobe pulmonary arteries bilaterally both the common unopacified at a similar level suggesting this may be related to poor cardiac output rather than true pulmonary embolus. Mediastinum/Nodes: No mediastinal, hilar, or axillary adenopathy. Trachea and esophagus are unremarkable. Thyroid unremarkable. Lungs/Pleura: Large right pleural effusion and small left pleural effusion. Bilateral lower lobe airspace opacities could reflect atelectasis or infiltrates/pneumonia. Upper Abdomen: Imaging into the upper abdomen demonstrates no acute findings. Musculoskeletal: Chest wall soft  tissues are unremarkable. No acute bony abnormality. Review of the MIP images confirms the above findings. IMPRESSION: Unopacified lower lobe pulmonary arteries bilaterally. Given the symmetric appearance in both lower lobes, this could be related to poor cardiac output and unopacified vessels rather than true pulmonary emboli. Pulmonary emboli however remain in the differential. Cardiomegaly.  Coronary artery disease. Large right pleural effusion and small left pleural effusion. Bilateral lower lobe atelectasis or infiltrate/pneumonia. Aortic Atherosclerosis (ICD10-I70.0). Electronically Signed   By: Rolm Baptise M.D.   On: 05/08/2021 00:08   DG Chest Portable 1 View  Result Date: 05/07/2021 CLINICAL DATA:  Dyspnea EXAM: PORTABLE CHEST 1 VIEW COMPARISON:  03/28/2021 FINDINGS: The lungs are symmetrically expanded. Small to moderate right and small left pleural effusions have developed. Mild bilateral perihilar interstitial pulmonary infiltrate has developed, asymmetrically more severe throughout the right lung, most in keeping with asymmetric pulmonary edema, less likely atypical infection. Cardiac size is mildly enlarged, new since prior examination. No pneumothorax. No acute bone abnormality. IMPRESSION: Mild cardiomegaly, new since prior examination, with changes of probable mild cardiogenic failure with perihilar asymmetric interstitial pulmonary edema and bilateral pleural effusions, right greater than left. Electronically Signed   By: Fidela Salisbury M.D.   On: 05/07/2021 23:01   ECHOCARDIOGRAM COMPLETE  Result Date: 06/02/2021    ECHOCARDIOGRAM REPORT   Patient Name:   KIAAN OVERHOLSER Date of Exam: 06/02/2021 Medical Rec #:  [id number]        Height:       66.0 in Accession #:    [id number]       Weight:       130.0 lb Date of Birth:  Jan 12, 1951        BSA:          1.665 m Patient Age:    31 years         BP:           145/85 mmHg Patient Gender: M                HR:           100 bpm. Exam Location:   Inpatient Procedure: 2D Echo, 3D Echo, Cardiac Doppler and Color Doppler Indications:    CHF-Acute Systolic X52.84  History:        Patient has prior history of Echocardiogram examinations, most                 recent 05/08/2021. CAD, COPD; Risk Factors:Former Smoker,                 Hypertension and Dyslipidemia. GERD. History of cancer.  Sonographer:    Darlina Sicilian RDCS Referring Phys: [id number] [place]  1. Left ventricular ejection fraction, by estimation, is <20%. The left ventricle has severely decreased function. The left ventricle demonstrates global hypokinesis. The left ventricular  internal cavity size was mildly dilated. Left ventricular diastolic parameters are consistent with Grade III diastolic dysfunction (restrictive). Elevated left atrial pressure. The average left ventricular global longitudinal strain is -7.4 %. The global longitudinal strain is abnormal.  2. Right ventricular systolic function is mildly reduced. The right ventricular size is normal. There is moderately elevated pulmonary artery systolic pressure.  3. Left atrial size was mildly dilated.  4. The mitral valve is normal in structure. Moderate mitral valve regurgitation. No evidence of mitral stenosis.  5. Tricuspid valve regurgitation is mild to moderate.  6. The aortic valve is tricuspid. Aortic valve regurgitation is mild. Mild aortic valve sclerosis is present, with no evidence of aortic valve stenosis.  7. The inferior vena cava is normal in size with <50% respiratory variability, suggesting right atrial pressure of 8 mmHg. FINDINGS  Left Ventricle: Left ventricular ejection fraction, by estimation, is <20%. The left ventricle has severely decreased function. The left ventricle demonstrates global hypokinesis. The average left ventricular global longitudinal strain is -7.4 %. The global longitudinal strain is abnormal. The left ventricular internal cavity size was mildly dilated. There is no left ventricular  hypertrophy. Left ventricular diastolic parameters are consistent with Grade III diastolic dysfunction (restrictive). Elevated left atrial pressure. Right Ventricle: The right ventricular size is normal. Right ventricular systolic function is mildly reduced. There is moderately elevated pulmonary artery systolic pressure. The tricuspid regurgitant velocity is 3.20 m/s, and with an assumed right atrial pressure of 8 mmHg, the estimated right ventricular systolic pressure is 66.2 mmHg. Left Atrium: Left atrial size was mildly dilated. Right Atrium: Right atrial size was normal in size. Pericardium: There is no evidence of pericardial effusion. Mitral Valve: The mitral valve is normal in structure. Moderate mitral valve regurgitation. No evidence of mitral valve stenosis. Tricuspid Valve: The tricuspid valve is normal in structure. Tricuspid valve regurgitation is mild to moderate. No evidence of tricuspid stenosis. Aortic Valve: The aortic valve is tricuspid. Aortic valve regurgitation is mild. Aortic regurgitation PHT measures 316 msec. Mild aortic valve sclerosis is present, with no evidence of aortic valve stenosis. Pulmonic Valve: The pulmonic valve was normal in structure. Pulmonic valve regurgitation is mild. No evidence of pulmonic stenosis. Aorta: The aortic root is normal in size and structure. Venous: The inferior vena cava is normal in size with less than 50% respiratory variability, suggesting right atrial pressure of 8 mmHg. IAS/Shunts: No atrial level shunt detected by color flow Doppler.  LEFT VENTRICLE PLAX 2D LVIDd:         5.30 cm  Diastology LVIDs:         5.10 cm  LV e' medial:    4.94 cm/s LV PW:         0.70 cm  LV E/e' medial:  21.3 LV IVS:        0.80 cm  LV e' lateral:   5.43 cm/s LVOT diam:     2.10 cm  LV E/e' lateral: 19.4 LV SV:         30 LV SV Index:   18       2D Longitudinal Strain LVOT Area:     3.46 cm 2D Strain GLS Avg:     -7.4 %                          3D Volume EF:  3D EF:        23 %                         LV EDV:       220 ml                         LV ESV:       169 ml                         LV SV:        51 ml RIGHT VENTRICLE RV S prime:     6.00 cm/s TAPSE (M-mode): 1.0 cm LEFT ATRIUM             Index       RIGHT ATRIUM           Index LA diam:        4.60 cm 2.76 cm/m  RA Area:     17.00 cm LA Vol (A2C):   46.9 ml 28.16 ml/m RA Volume:   51.20 ml  30.75 ml/m LA Vol (A4C):   48.6 ml 29.18 ml/m LA Biplane Vol: 48.8 ml 29.31 ml/m  AORTIC VALVE             PULMONIC VALVE LVOT Vmax:   64.60 cm/s  PR End Diast Vel: 17.47 msec LVOT Vmean:  37.400 cm/s LVOT VTI:    0.087 m AI PHT:      316 msec  AORTA Ao Root diam: 3.00 cm Ao Asc diam:  2.70 cm MITRAL VALVE                 TRICUSPID VALVE MV Area (PHT): 2.82 cm      TR Peak grad:   41.0 mmHg MV Decel Time: 269 msec      TR Vmax:        320.00 cm/s MR Peak grad:    63.4 mmHg MR Mean grad:    39.0 mmHg   SHUNTS MR Vmax:         398.00 cm/s Systemic VTI:  0.09 m MR Vmean:        287.0 cm/s  Systemic Diam: 2.10 cm MR PISA:         1.57 cm MR PISA Eff ROA: 16 mm MR PISA Radius:  0.50 cm MV E velocity: 105.25 cm/s Kirk Ruths MD Electronically signed by Kirk Ruths MD Signature Date/Time: 06/02/2021/4:08:31 PM    Final    ECHOCARDIOGRAM COMPLETE  Result Date: 05/08/2021    ECHOCARDIOGRAM REPORT   Patient Name:   SHREYAN HINZ Date of Exam: 05/08/2021 Medical Rec #:  [id number]        Height:       66.0 in Accession #:    [id number]       Weight:       135.8 lb Date of Birth:  06/10/1951        BSA:          1.696 m Patient Age:    23 years         BP:           157/90 mmHg Patient Gender: M                HR:           101 bpm. Exam Location:  Inpatient Procedure: 2D Echo, Cardiac Doppler  and Color Doppler  Results communicated to Dr Rodena Piety at 12:45pm. Indications:    CHF-Acute Systolic G99.24  History:        Patient has prior history of Echocardiogram examinations, most                 recent  05/22/2020. CHF and Idiopathic CMP, CAD, COPD; Risk                 Factors:Dyslipidemia, Hypertension and ETOH.  Sonographer:    Bernadene Person RDCS Referring Phys: [id number] [place]  1. Left ventricular ejection fraction, by estimation, is 25 to 30%. The left ventricle has severely decreased function. The left ventricle demonstrates global hypokinesis. There is mild left ventricular hypertrophy. Left ventricular diastolic parameters  are indeterminate.  2. Right ventricular systolic function was not well visualized. The right ventricular size is not well visualized. There is normal pulmonary artery systolic pressure. The estimated right ventricular systolic pressure is 62.2 mmHg.  3. The mitral valve is abnormal. Moderate mitral valve regurgitation. Appears functional.  4. The aortic valve is tricuspid. Aortic valve regurgitation is moderate. Mild aortic valve sclerosis is present, with no evidence of aortic valve stenosis.  5. The inferior vena cava is normal in size with greater than 50% respiratory variability, suggesting right atrial pressure of 3 mmHg. FINDINGS  Left Ventricle: Left ventricular ejection fraction, by estimation, is 25 to 30%. The left ventricle has severely decreased function. The left ventricle demonstrates global hypokinesis. The left ventricular internal cavity size was normal in size. There is mild left ventricular hypertrophy. Left ventricular diastolic parameters are indeterminate. Right Ventricle: The right ventricular size is not well visualized. Right vetricular wall thickness was not well visualized. Right ventricular systolic function was not well visualized. There is normal pulmonary artery systolic pressure. The tricuspid regurgitant velocity is 2.65 m/s, and with an assumed right atrial pressure of 3 mmHg, the estimated right ventricular systolic pressure is 29.7 mmHg. Left Atrium: Left atrial size was normal in size. Right Atrium: Right atrial size was normal  in size. Pericardium: There is no evidence of pericardial effusion. Mitral Valve: The mitral valve is abnormal. Moderate mitral valve regurgitation. Tricuspid Valve: The tricuspid valve is normal in structure. Tricuspid valve regurgitation is mild. Aortic Valve: The aortic valve is tricuspid. Aortic valve regurgitation is moderate. Aortic regurgitation PHT measures 291 msec. Mild aortic valve sclerosis is present, with no evidence of aortic valve stenosis. Pulmonic Valve: The pulmonic valve was not well visualized. Pulmonic valve regurgitation is trivial. Aorta: The aortic root and ascending aorta are structurally normal, with no evidence of dilitation. Venous: The inferior vena cava is normal in size with greater than 50% respiratory variability, suggesting right atrial pressure of 3 mmHg. IAS/Shunts: The interatrial septum was not well visualized. Additional Comments: There is a small pleural effusion in the left lateral region.  LEFT VENTRICLE PLAX 2D LVIDd:         5.20 cm LVIDs:         4.50 cm LV PW:         1.10 cm LV IVS:        0.90 cm LVOT diam:     2.10 cm LV SV:         39 LV SV Index:   23 LVOT Area:     3.46 cm  LV Volumes (MOD) LV vol d, MOD A2C: 134.0 ml LV vol d, MOD A4C: 130.0 ml LV vol s, MOD A2C: 96.4 ml  LV vol s, MOD A4C: 103.0 ml LV SV MOD A2C:     37.6 ml LV SV MOD A4C:     130.0 ml LV SV MOD BP:      32.8 ml RIGHT VENTRICLE RV S prime:     6.15 cm/s TAPSE (M-mode): 1.6 cm LEFT ATRIUM             Index       RIGHT ATRIUM           Index LA diam:        3.60 cm 2.12 cm/m  RA Area:     12.80 cm LA Vol (A2C):   46.1 ml 27.18 ml/m RA Volume:   30.60 ml  18.04 ml/m LA Vol (A4C):   43.0 ml 25.35 ml/m LA Biplane Vol: 48.4 ml 28.53 ml/m  AORTIC VALVE LVOT Vmax:         83.65 cm/s LVOT Vmean:        52.400 cm/s LVOT VTI:          0.112 m AI PHT:            291 msec AR Vena Contracta: 0.40 cm  AORTA Ao Root diam: 3.20 cm Ao Asc diam:  2.90 cm MR Peak grad:    110.7 mmHg  TRICUSPID VALVE MR Mean  grad:    69.0 mmHg   TR Peak grad:   28.1 mmHg MR Vmax:         526.00 cm/s TR Vmax:        265.00 cm/s MR Vmean:        389.0 cm/s MR PISA:         0.57 cm    SHUNTS MR PISA Eff ROA: 4 mm       Systemic VTI:  0.11 m MR PISA Radius:  0.30 cm     Systemic Diam: 2.10 cm Oswaldo Milian MD Electronically signed by Oswaldo Milian MD Signature Date/Time: 05/08/2021/12:50:38 PM    Final      Subjective: Patient seen examined at bedside, resting comfortably.  No specific complaints this morning.  Feels his breathing is much improved and back to his normal baseline.  Oxygenating well on room air.  States ambulated in the room this morning to the bathroom without issue.  No other specific complaints or concerns at this time.  Ready for discharge home.  He states has outpatient follow-up rescheduled with Dr. Oval Linsey later this month.  Denies headache, no visual changes, no chest pain, no palpitations, no shortness of breath, no abdominal pain, no weakness, no fatigue, no paresthesias.  No acute events overnight per nursing staff.  Discharge Exam: Vitals:   06/03/21 0918 06/03/21 1000  BP: (!) 142/90 (!) 169/102  Pulse: (!) 102 (!) 102  Resp: 20   Temp: 98.1 F (36.7 C)   SpO2: 97%    Vitals:   06/03/21 0402 06/03/21 0746 06/03/21 0918 06/03/21 1000  BP:  136/82 (!) 142/90 (!) 169/102  Pulse:  98 (!) 102 (!) 102  Resp:  18 20   Temp:  98.2 F (36.8 C) 98.1 F (36.7 C)   TempSrc:  Oral Oral   SpO2:  96% 97%   Weight: 58.6 kg     Height:        General: Pt is alert, awake, not in acute distress, thin/chronically ill in appearance Cardiovascular: RRR, S1/S2 +, no rubs, no gallops Respiratory: CTA bilaterally, no wheezing, no rhonchi, on room air Abdominal: Soft, NT, ND,  bowel sounds + Extremities: no edema, no cyanosis    The results of significant diagnostics from this hospitalization (including imaging, microbiology, ancillary and laboratory) are listed below for reference.      Microbiology: Recent Results (from the past 240 hour(s))  Resp Panel by RT-PCR (Flu A&B, Covid) Nasopharyngeal Swab     Status: None   Collection Time: 06/02/21  3:37 AM   Specimen: Nasopharyngeal Swab; Nasopharyngeal(NP) swabs in vial transport medium  Result Value Ref Range Status   SARS Coronavirus 2 by RT PCR NEGATIVE NEGATIVE Final    Comment: (NOTE) SARS-CoV-2 target nucleic acids are NOT DETECTED.  The SARS-CoV-2 RNA is generally detectable in upper respiratory specimens during the acute phase of infection. The lowest concentration of SARS-CoV-2 viral copies this assay can detect is 138 copies/mL. A negative result does not preclude SARS-Cov-2 infection and should not be used as the sole basis for treatment or other patient management decisions. A negative result may occur with  improper specimen collection/handling, submission of specimen other than nasopharyngeal swab, presence of viral mutation(s) within the areas targeted by this assay, and inadequate number of viral copies(<138 copies/mL). A negative result must be combined with clinical observations, patient history, and epidemiological information. The expected result is Negative.  Fact Sheet for Patients:  EntrepreneurPulse.com.au  Fact Sheet for Healthcare Providers:  IncredibleEmployment.be  This test is no t yet approved or cleared by the [place] FDA and  has been authorized for detection and/or diagnosis of SARS-CoV-2 by FDA under an Emergency Use Authorization (EUA). This EUA will remain  in effect (meaning this test can be used) for the duration of the COVID-19 declaration under Section 564(b)(1) of the Act, 21 U.S.C.section 360bbb-3(b)(1), unless the authorization is terminated  or revoked sooner.       Influenza A by PCR NEGATIVE NEGATIVE Final   Influenza B by PCR NEGATIVE NEGATIVE Final    Comment: (NOTE) The Xpert Xpress SARS-CoV-2/FLU/RSV plus assay is  intended as an aid in the diagnosis of influenza from Nasopharyngeal swab specimens and should not be used as a sole basis for treatment. Nasal washings and aspirates are unacceptable for Xpert Xpress SARS-CoV-2/FLU/RSV testing.  Fact Sheet for Patients: EntrepreneurPulse.com.au  Fact Sheet for Healthcare Providers: IncredibleEmployment.be  This test is not yet approved or cleared by the [place] FDA and has been authorized for detection and/or diagnosis of SARS-CoV-2 by FDA under an Emergency Use Authorization (EUA). This EUA will remain in effect (meaning this test can be used) for the duration of the COVID-19 declaration under Section 564(b)(1) of the Act, 21 U.S.C. section 360bbb-3(b)(1), unless the authorization is terminated or revoked.  Performed at [facility], [place] [address], [place], [place] [address]      Labs: BNP (last 3 results) Recent Labs    05/10/21 0514 05/11/21 0523 06/02/21 0311  BNP 2,131.0* 1,797.3* 6,606.3*   Basic Metabolic Panel: Recent Labs  Lab 06/02/21 0507 06/03/21 0408  NA 136 135  K 4.5 4.2  CL 99 97*  CO2 25 26  GLUCOSE 107* 101*  BUN 25* 34*  CREATININE 1.28* 1.48*  CALCIUM 9.2 9.7   Liver Function Tests: Recent Labs  Lab 06/02/21 0507 06/03/21 0408  AST 26 24  ALT 16 15  ALKPHOS 35* 34*  BILITOT 0.5 0.6  PROT 7.3 7.3  ALBUMIN 3.7 3.8   No results for input(s): LIPASE, AMYLASE in the last 168 hours. No results for input(s): AMMONIA in the last 168 hours. CBC:  Recent Labs  Lab 06/02/21 0411 06/02/21 0507 06/03/21 0408  WBC 4.5 4.4 4.6  NEUTROABS 3.1  --   --   HGB 13.4 13.4 13.6  HCT 41.4 42.0 41.9  MCV 86.1 87.0 84.6  PLT 215 229 224   Cardiac Enzymes: No results for input(s): CKTOTAL, CKMB, CKMBINDEX, TROPONINI in the last 168 hours. BNP: Invalid input(s): POCBNP CBG: No results for input(s): GLUCAP in the last 168 hours. D-Dimer No results  for input(s): DDIMER in the last 72 hours. Hgb A1c No results for input(s): HGBA1C in the last 72 hours. Lipid Profile No results for input(s): CHOL, HDL, LDLCALC, TRIG, CHOLHDL, LDLDIRECT in the last 72 hours. Thyroid function studies No results for input(s): TSH, T4TOTAL, T3FREE, THYROIDAB in the last 72 hours.  Invalid input(s): FREET3 Anemia work up No results for input(s): VITAMINB12, FOLATE, FERRITIN, TIBC, IRON, RETICCTPCT in the last 72 hours. Urinalysis    Component Value Date/Time   COLORURINE YELLOW 05/07/2021 2345   APPEARANCEUR HAZY (A) 05/07/2021 2345   APPEARANCEUR Clear 05/29/2020 1556   LABSPEC 1.023 05/07/2021 2345   PHURINE 5.0 05/07/2021 2345   GLUCOSEU NEGATIVE 05/07/2021 2345   HGBUR NEGATIVE 05/07/2021 2345   BILIRUBINUR NEGATIVE 05/07/2021 2345   BILIRUBINUR Negative 05/29/2020 1556   KETONESUR NEGATIVE 05/07/2021 2345   PROTEINUR 100 (A) 05/07/2021 2345   UROBILINOGEN 1.0 08/04/2014 2007   NITRITE NEGATIVE 05/07/2021 2345   LEUKOCYTESUR TRACE (A) 05/07/2021 2345   Sepsis Labs Invalid input(s): PROCALCITONIN,  WBC,  LACTICIDVEN Microbiology Recent Results (from the past 240 hour(s))  Resp Panel by RT-PCR (Flu A&B, Covid) Nasopharyngeal Swab     Status: None   Collection Time: 06/02/21  3:37 AM   Specimen: Nasopharyngeal Swab; Nasopharyngeal(NP) swabs in vial transport medium  Result Value Ref Range Status   SARS Coronavirus 2 by RT PCR NEGATIVE NEGATIVE Final    Comment: (NOTE) SARS-CoV-2 target nucleic acids are NOT DETECTED.  The SARS-CoV-2 RNA is generally detectable in upper respiratory specimens during the acute phase of infection. The lowest concentration of SARS-CoV-2 viral copies this assay can detect is 138 copies/mL. A negative result does not preclude SARS-Cov-2 infection and should not be used as the sole basis for treatment or other patient management decisions. A negative result may occur with  improper specimen collection/handling,  submission of specimen other than nasopharyngeal swab, presence of viral mutation(s) within the areas targeted by this assay, and inadequate number of viral copies(<138 copies/mL). A negative result must be combined with clinical observations, patient history, and epidemiological information. The expected result is Negative.  Fact Sheet for Patients:  EntrepreneurPulse.com.au  Fact Sheet for Healthcare Providers:  IncredibleEmployment.be  This test is no t yet approved or cleared by the [place] FDA and  has been authorized for detection and/or diagnosis of SARS-CoV-2 by FDA under an Emergency Use Authorization (EUA). This EUA will remain  in effect (meaning this test can be used) for the duration of the COVID-19 declaration under Section 564(b)(1) of the Act, 21 U.S.C.section 360bbb-3(b)(1), unless the authorization is terminated  or revoked sooner.       Influenza A by PCR NEGATIVE NEGATIVE Final   Influenza B by PCR NEGATIVE NEGATIVE Final    Comment: (NOTE) The Xpert Xpress SARS-CoV-2/FLU/RSV plus assay is intended as an aid in the diagnosis of influenza from Nasopharyngeal swab specimens and should not be used as a sole basis for treatment. Nasal washings and aspirates are unacceptable for Xpert Xpress SARS-CoV-2/FLU/RSV testing.  Fact  Sheet for Patients: EntrepreneurPulse.com.au  Fact Sheet for Healthcare Providers: IncredibleEmployment.be  This test is not yet approved or cleared by the [place] FDA and has been authorized for detection and/or diagnosis of SARS-CoV-2 by FDA under an Emergency Use Authorization (EUA). This EUA will remain in effect (meaning this test can be used) for the duration of the COVID-19 declaration under Section 564(b)(1) of the Act, 21 U.S.C. section 360bbb-3(b)(1), unless the authorization is terminated or revoked.  Performed at [facility], [place] [address] [place], [place] [address]      Time coordinating discharge: Over 30 minutes  SIGNED:   Daylan Boggess J [place] DO  [facility] 06/03/2021, 10:48 AM

## 2021-06-03 NOTE — TOC Initial Note (Signed)
Transition of Care [facility]) - Initial/Assessment Note    Patient Details  Name: Collin Young MRN: [id number] Date of Birth: Sep 24, 1950  Transition of Care [facility]) CM/SW Contact:    Leeroy Cha, RN Phone Number: 06/03/2021, 12:53 PM  Clinical Narrative:                 Dcd to return home with self care no toc needs prezent.  Expected Discharge Plan: Home/Self Care Barriers to Discharge: No Barriers Identified   Patient Goals and CMS Choice Patient states their goals for this hospitalization and ongoing recovery are:: to go home CMS Medicare.gov Compare Post Acute Care list provided to:: Patient Choice offered to / list presented to : Patient  Expected Discharge Plan and Services Expected Discharge Plan: Home/Self Care   Discharge Planning Services: CM Consult   Living arrangements for the past 2 months: Apartment Expected Discharge Date: 06/03/21                                    Prior Living Arrangements/Services Living arrangements for the past 2 months: Apartment Lives with:: Self Patient language and need for interpreter reviewed:: Yes Do you feel safe going back to the place where you live?: Yes      Need for Family Participation in Patient Care: No (Comment) Care giver support system in place?: No (comment)   Criminal Activity/Legal Involvement Pertinent to Current Situation/Hospitalization: No - Comment as needed  Activities of Daily Living Home Assistive Devices/Equipment: Dentures (specify type), Hearing aid (upper/lower dentures, bilateral hearing aids) ADL Screening (condition at time of admission) Patient's cognitive ability adequate to safely complete daily activities?: Yes Is the patient deaf or have difficulty hearing?: Yes (wears bilateral haring aids) Does the patient have difficulty seeing, even when wearing glasses/contacts?: No Does the patient have difficulty concentrating, remembering, or making decisions?: No Patient able to  express need for assistance with ADLs?: Yes Does the patient have difficulty dressing or bathing?: No Independently performs ADLs?: Yes (appropriate for developmental age) Does the patient have difficulty walking or climbing stairs?: Yes (secondary to shortness of breath) Weakness of Legs: Both Weakness of Arms/Hands: None  Permission Sought/Granted                  Emotional Assessment Appearance:: Appears stated age     Orientation: : Oriented to Self, Oriented to Place, Oriented to  Time, Oriented to Situation Alcohol / Substance Use: Not Applicable Psych Involvement: No (comment)  Admission diagnosis:  Acute on chronic systolic congestive heart failure ([place]) [I50.23] Acute on chronic heart failure ([place]) [I50.9] Patient Active Problem List   Diagnosis Date Noted   Acute on chronic heart failure ([place]) 06/02/2021   GERD without esophagitis 05/08/2021   Mixed hyperlipidemia 05/08/2021   Hypertensive urgency 05/08/2021   Elevated troponin level not due myocardial infarction 05/08/2021   Pure hypercholesterolemia 09/23/2020   Claudication in peripheral vascular disease ([place]) 01/25/2020   Late latent syphilis 01/15/2020   Coronary artery disease involving native coronary artery of native heart 07/05/2019   Chronic kidney disease, stage 3a ([place]) 07/05/2019   Combined systolic and diastolic heart failure ([place]) 09/26/2018   Malignant neoplasm of prostate ([place]) 06/17/2018   Medication management    Dysphagia    Ischemic cardiomyopathy    Congestive dilated cardiomyopathy ([place])    Acute on chronic combined systolic and diastolic CHF (congestive heart failure) ([place]) 01/02/2017  Malignant neoplasm of pyriform sinus (HCC) 09/28/2013   Piriform sinus tumor 09/24/2013   NONSPECIFIC ABN FINDING RAD & OTH EXAM GI TRACT 05/14/2009   PCP:  Darreld Mclean, MD Pharmacy:   [facility] [place], [address] [place] [address] Phone: [phone] Fax: [phone]  [facility] [phone] - [place], [place] - [address] [place] [address] Phone: [phone] Fax: [phone]     Social Determinants of Health ([place]) Interventions    Readmission Risk Interventions Readmission Risk Prevention Plan 06/03/2021  Transportation Screening Complete  PCP or Specialist Appt within 5-7 Days Complete  Home Care Screening Complete  Medication Review (RN CM) Complete  Some recent data might be hidden

## 2021-06-04 ENCOUNTER — Ambulatory Visit (INDEPENDENT_AMBULATORY_CARE_PROVIDER_SITE_OTHER): Payer: Medicare Other | Admitting: Family Medicine

## 2021-06-04 ENCOUNTER — Other Ambulatory Visit: Payer: Self-pay | Admitting: *Deleted

## 2021-06-04 ENCOUNTER — Other Ambulatory Visit: Payer: Self-pay

## 2021-06-04 ENCOUNTER — Other Ambulatory Visit (HOSPITAL_BASED_OUTPATIENT_CLINIC_OR_DEPARTMENT_OTHER): Payer: Self-pay

## 2021-06-04 VITALS — BP 114/82 | HR 92 | Temp 98.2°F | Resp 18 | Wt 136.4 lb

## 2021-06-04 DIAGNOSIS — I5043 Acute on chronic combined systolic (congestive) and diastolic (congestive) heart failure: Secondary | ICD-10-CM

## 2021-06-04 DIAGNOSIS — N183 Chronic kidney disease, stage 3 unspecified: Secondary | ICD-10-CM

## 2021-06-04 DIAGNOSIS — C12 Malignant neoplasm of pyriform sinus: Secondary | ICD-10-CM

## 2021-06-04 DIAGNOSIS — Z09 Encounter for follow-up examination after completed treatment for conditions other than malignant neoplasm: Secondary | ICD-10-CM

## 2021-06-04 DIAGNOSIS — C61 Malignant neoplasm of prostate: Secondary | ICD-10-CM

## 2021-06-04 MED ORDER — FENTANYL 12 MCG/HR TD PT72
1.0000 | MEDICATED_PATCH | TRANSDERMAL | 0 refills | Status: DC
Start: 2021-06-04 — End: 2021-08-01

## 2021-06-04 NOTE — Progress Notes (Signed)
[place] [facility] [place], [place], [place] [address] [phone]  Date:  06/04/2021   Name:  Collin Young   DOB:  1950-10-06   MRN:  [id number]  PCP:  Darreld Mclean, MD    Chief Complaint: Shortness of Breath [facility]: 06/02/21 CHF/Concerns/ questions: none/Flu shot today: 05/14/2021/)   History of Present Illness:  Collin Young is a 70 y.o. very pleasant male patient who presents with the following:  Pt seen today to follow-up from recent admission for CHF exacerbation He has history of esophageal cancer, piriform sinus cancer status post chemo and radiation 2015 as well as feeding tube since reversed, prostate cancer, CHF/CAD, chronic hepatitis C He notes that sx started the day prior to his admission  BNP on admit 2300 Recent echocardiogram showed that his EF has decreased significantly He does have a heart failure clinic appointment in November Echo 06/02/21:  1. Left ventricular ejection fraction, by estimation, is <20%. The left  ventricle has severely decreased function. The left ventricle demonstrates  global hypokinesis. The left ventricular internal cavity size was mildly  dilated. Left ventricular  diastolic parameters are consistent with Grade III diastolic dysfunction  (restrictive). Elevated left atrial pressure. The average left ventricular  global longitudinal strain is -7.4 %. The global longitudinal strain is  abnormal.   Patient notes his weight was 132 at home this morning.  He is not completely clear on what his "dry weight" is but seems to be around 130. Wt Readings from Last 3 Encounters:  06/04/21 136 lb 6.4 oz (61.9 kg)  06/03/21 129 lb 3 oz (58.6 kg)  05/14/21 130 lb (59 kg)   He is taking lasix 20 BID-he notes that taking 40 mg at once may cause him to feel dizzy He feels like his energy level is improved though not entirely back to baseline  Admit date: 06/02/2021 Discharge  date: 06/03/2021 Recommendations for Outpatient Follow-up:  Follow up with PCP in 1-2 weeks Follow-up with Dr. Oval Linsey as scheduled on June 26, 2021 rapid 10:40 AM Please obtain BMP in one week to assess renal function Recommend monitoring daily weights, and bring to next PCP/cardiology visit History of present illness: Collin Young is a 70 year old male with past medical history significant for chronic systolic and diastolic congestive heart failure, esophageal cancer, prostate cancer, hyperlipidemia, CKD stage IIIa who [facility] with complaint of shortness of breath.  Patient attempted to use his home inhalers without any help.  Denies any chest pain or lower extremity edema.  Patient reports dyspnea worse when lying flat.  No other known exacerbating or alleviating factors.   In the ED, temperature 97.3 F, HR 97, RR 18, BP 154/92, SPO2 100% on room air.  Sodium 136, potassium 4.5, CO2 25, chloride 99, BUN 25, creatinine 1.28, glucose 107.  BNP 2342.  High-sensitivity troponin 38, 38, 46.  WBC 4.5, hemoglobin 13.4, platelets 215.  COVID-19 PCR negative.  Influenza A/B PCR negative.  Chest x-ray with bilateral pleural effusions, right greater than left, persistent but improved airspace opacity right base.EKG with sinus tachycardia, rate 100, QTc 49, no concerning dynamic changes.  EDP started on IV Lasix.  Duration consulted for further evaluation management of acute on chronic combined systolic/diastolic congestive heart failure exacerbation.   Hospital course: Acute on chronic combined systolic/diastolic congestive heart failure exacerbation Patient presenting to the ED with progressive shortness of breath, even  at rest and worse while lying flat.  No significant improvement with his home inhalers.  BNP elevated on admission with chest x-ray with bilateral pleural effusions.  Patient was started on IV diuresis with furosemide 60 mg every 12 hours with improvement of  his symptoms and was net -2.5 L during hospitalization.  Patient's weight on admission 60.6 kg down to 58.6 kg on discharge.  Patient feels that he is back at his normal baseline and ready for discharge home.  Patient has scheduled follow-up with his cardiologist, Dr. Oval Linsey on 06/26/2021 at 10:40 AM.  Patient will continue his home Lasix 40 mg p.o. as needed for weight gain of 2 pounds in a day or 5 pounds in a week.  Recommend daily weights.  Continue home Entresto, metoprolol, Farxiga.  Recommend repeat BMP 1 week to assess renal function.   Hyperlipidemia: Continue Repatha and pravastatin Essential hypertension: Continue metoprolol and Entresto CKD stage IIIa Baseline creatinine 1.3-1.4.  Creatinine admission 1.28 and up to 1.48 on discharge following aggressive IV diuresis.  Recommend repeat BMP 1 week. 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 Marin Olp Discharge Diagnoses:  Active Problems:   Acute on chronic heart failure [facility])     Patient Active Problem List   Diagnosis Date Noted   Acute on chronic heart failure ([place]) 06/02/2021   GERD without esophagitis 05/08/2021   Mixed hyperlipidemia 05/08/2021   Hypertensive urgency 05/08/2021   Elevated troponin level not due myocardial infarction 05/08/2021   Pure hypercholesterolemia 09/23/2020   Claudication in peripheral vascular disease ([place]) 01/25/2020   Late latent syphilis 01/15/2020   Coronary artery disease involving native coronary artery of native heart 07/05/2019   Chronic kidney disease, stage 3a ([place]) 07/05/2019   Combined systolic and diastolic heart failure ([place]) 09/26/2018   Malignant neoplasm of prostate ([place]) 06/17/2018   Medication management    Dysphagia    Ischemic cardiomyopathy    Congestive dilated cardiomyopathy ([place])    Acute on chronic combined systolic and diastolic CHF (congestive heart failure) ([place])  01/02/2017   Malignant neoplasm of pyriform sinus ([place]) 09/28/2013   Piriform sinus tumor 09/24/2013   NONSPECIFIC ABN FINDING RAD & OTH EXAM GI TRACT 05/14/2009    Past Medical History:  Diagnosis Date   Chronic combined systolic and diastolic CHF (congestive heart failure) ([place])    followed by [name]   COPD (chronic obstructive pulmonary disease) ([place])    Coronary artery disease cardiologist-- [name]   per cardiac cath 01-05-2017  chronic total occlusion pLCx with collaterals, 99% severe calcified prox. to mid RCA, otherwise mild to moderate CAD (medically managed)   Esophageal cancer, stage IIIB [facility]) oncologist-  dr ennever/  dr moody   dx 2010  SCC Stage IIIB completed chemoradiation;  localized recurrent left piriform sinus 01/ 2015,  completed concurrent chemoradiatoin 04/ 2015   GERD (gastroesophageal reflux disease)    09-07-2018   no issues since Gtube removed 06/ 2019   Headache    History of alcohol abuse    quit 2001   History of cancer chemotherapy    2010;   2015   History of radiation therapy    10-19-2013 to  12-05-2013 pyriform sinus 69.96 Gy/49fx;   Radiation completed 2010 for esophageal cancer   History of seizure 2001   alcohol withdrawal   HOH (hard of hearing)    Hyperlipidemia    Hypertension  Ischemic cardiomyopathy 12/2016   01-03-2017  echo,  ef 10-15%/   echo 05-06-2017 EF improved to 45-50%   Lower urinary tract symptoms (LUTS)    Prostate cancer [facility]) urologist-  dr ottelin/  oncologist-- [name]   dx 05-27-2018--- Stage T1c,  Gleason 4+3,  PSA 9.3--  scheduled for brachytherapy 09-23-2017   Pure hypercholesterolemia 09/23/2020   Renal cyst, left    Voice hoarseness    secondary to radiation treatment    Past Surgical History:  Procedure Laterality Date   CARDIOVASCULAR STRESS TEST  10/09/09   normal nuclearr stress test, EF 57% ([place])   IR GASTROSTOMY TUBE MOD SED  01/13/2017   IR GASTROSTOMY TUBE REMOVAL   02/03/2018   IR PATIENT EVAL TECH 0-60 MINS  03/25/2017   IR REMOVAL TUN ACCESS W/ PORT W/O FL MOD SED  01/15/2017   IR REPLACE G-TUBE SIMPLE WO FLUORO  01/13/2018   LARYNGOSCOPY N/A 09/15/2013   Procedure: LARYNGOSCOPY;  Surgeon: [name], MD;  Location: [place];  Service: ENT;  Laterality: N/A;  direct laryngoscopy with biopsy and esophagoscopy   RADIOACTIVE SEED IMPLANT N/A 09/23/2018   Procedure: RADIOACTIVE SEED IMPLANT/BRACHYTHERAPY IMPLANT;  Surgeon: [name], MD;  Location: [place];  Service: Urology;  Laterality: N/A;   RIGHT/LEFT HEART CATH AND CORONARY ANGIOGRAPHY N/A 01/05/2017   Procedure: Right/Left Heart Cath and Coronary Angiography;  Surgeon: [name], MD;  Location: [place] CV LAB;  Service: Cardiovascular;  Laterality: N/A;   TRANSTHORACIC ECHOCARDIOGRAM  05/06/2017   ef 45-50%,  grade 1 diastolic dysfunction/  AV severe calcified non coronary cusp with moderate regurg. , no stenosis (valve area per echo 01-05-2017 1.08cm^2)/  mild MR, TR, and PR/  mild LAE    Social History   Tobacco Use   Smoking status: Former    Packs/day: 1.00    Years: 40.00    Pack years: 40.00    Types: Cigarettes    Start date: 11/08/1960    Quit date: 08/31/2009    Years since quitting: 11.7   Smokeless tobacco: Former    Types: Chew    Quit date: 09/01/1999  Vaping Use   Vaping Use: Never used  Substance Use Topics   Alcohol use: Not Currently    Alcohol/week: 0.0 standard drinks    Comment: quit in 2001   Drug use: No    Comment: back in the day used cocaine,alcohol, marijuana    Family History  Problem Relation Age of Onset   Breast cancer Mother    Prostate cancer Neg Hx    Colon cancer Neg Hx    Pancreatic cancer Neg Hx     No Known Allergies  Medication list has been reviewed and updated.  Current Outpatient Medications on File Prior to Visit  Medication Sig Dispense Refill   albuterol (VENTOLIN HFA) 108 (90 Base) MCG/ACT inhaler  INHALE 2 PUFFS INTO THE LUNGS EVERY 6 HOURS AS NEEDED FOR WHEEZING OR SHORTNESS OF BREATH (Patient taking differently: Inhale 2 puffs into the lungs every 6 (six) hours as needed for wheezing or shortness of breath.) 20.1 g 1   aspirin EC 81 MG tablet Take 81 mg by mouth daily.     dapagliflozin propanediol (FARXIGA) 10 MG TABS tablet Take 1 tablet (10 mg total) by mouth daily. 30 tablet 2   ENTRESTO 24-26 MG TAKE 1 TABLET BY MOUTH TWICE DAILY (Patient taking differently: Take 1 tablet by mouth daily.) 180 tablet 3   Evolocumab (  REPATHA SURECLICK) 370 MG/ML SOAJ Inject 140 mg into the skin every 14 (fourteen) days. 2 mL 11   furosemide (LASIX) 40 MG tablet Take 40 mg of Lasix if you gain weight more than 2 pounds per day or more than 5 pounds per week 30 tablet 2   metoprolol succinate (TOPROL-XL) 25 MG 24 hr tablet Take 0.5 tablets (12.5 mg total) by mouth daily. 30 tablet 2   Multiple Vitamin (MULTIVITAMIN) tablet Take 1 tablet by mouth daily.     polyethylene glycol (MIRALAX / GLYCOLAX) 17 g packet Take 17 g by mouth daily as needed for mild constipation. 14 each 0   pravastatin (PRAVACHOL) 80 MG tablet Take 1 tablet (80 mg total) by mouth every evening. 90 tablet 3   Current Facility-Administered Medications on File Prior to Visit  Medication Dose Route Frequency Provider Last Rate Last Admin   topical emolient (BIAFINE) emulsion   Topical Daily Kyung Rudd, MD   Given at 01/19/14 4888    Review of Systems:  As per HPI- otherwise negative.   Physical Examination: Vitals:   06/04/21 1607  BP: 114/82  Pulse: 92  Resp: 18  Temp: 98.2 F (36.8 C)  SpO2: 98%   Vitals:   06/04/21 1607  Weight: 136 lb 6.4 oz (61.9 kg)   Body mass index is 22.02 kg/m. Ideal Body Weight:    GEN: no acute distress.  Looks well and his normal self Chronic voice change due to head and neck cancer HEENT: Atraumatic, Normocephalic.  Ears and Nose: No external deformity. CV: RRR, No M/G/R. No JVD. No  thrill. No extra heart sounds. PULM: CTA B, no wheezes, crackles, rhonchi. No retractions. No resp. distress. No accessory muscle use. ABD: S, NT, ND EXTR: No c/c/e PSYCH: Normally interactive. Conversant.  Appears euvolemic  Assessment and Plan: Hospital discharge follow-up  Acute on chronic combined systolic and diastolic CHF (congestive heart failure) (HCC)  CRI (chronic renal insufficiency), stage 3 (moderate) (HCC)  Kashus is seen today for follow-up from recent overnight hospitalization due to CHF exacerbation He feels better, but his weight is still likely elevated/has gone back up since discharge yesterday The history is not entirely clear, but I think he took 20 mg of Lasix this morning.  I asked him to take another 85 when he gets home this afternoon Then, he can return to his 20 mg twice daily routine; I asked him to please alert me or his cardiologist if he goes up 2 pounds overnight or 5 pounds in a week  I discussed his situation on the phone with his daughter today-explained that his heart failure is serious, we will need to monitor his weight closely, and we plan to follow-up with the advanced heart failure team  Made him an appointment to see me in 1 week for follow-up and labs.  I asked him to please weigh himself every morning before he gets stressed, record his weights and bring with him   Signed Lamar Blinks, MD

## 2021-06-04 NOTE — Patient Instructions (Addendum)
Please take 40 mg of furosemide when you get home from the clinic Please continue to weigh yourself every am- weigh without your clothes first thing and write it down If your weight goes up by 2 lbs in a day or 5 lbs in a week please contact me  I will be in touch with Dr Oval Linsey and the heart failure team so we can get you seen

## 2021-06-08 NOTE — Progress Notes (Deleted)
[place] at [facility] [address] [place], [place] [address] [phone]  Date:  06/11/2021   Name:  Collin Young   DOB:  01/27/1951   MRN:  [id number]  PCP:  Darreld Mclean, MD    Chief Complaint: No chief complaint on file.   History of Present Illness:  Collin Young is a 70 y.o. 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  I asked him to take an extra 20 mg of furosemide, and to continue careful daily weight monitoring with instructions for furosemide dose adjustment  His renal insufficiency has been relatively minor, GFR's still typically in the 37s.  He has a stress test scheduled later this month, then a visit with his primary cardiologist Dr. Oval Linsey, and then a visit with the advanced heart failure team to establish care  Patient Active Problem List   Diagnosis Date Noted   Acute on chronic heart failure ([place]) 06/02/2021   GERD without esophagitis 05/08/2021   Mixed hyperlipidemia 05/08/2021   Hypertensive urgency 05/08/2021   Elevated troponin level not due myocardial infarction 05/08/2021   Pure hypercholesterolemia 09/23/2020   Claudication in peripheral vascular disease ([place]) 01/25/2020   Late latent syphilis 01/15/2020   Coronary artery disease involving native coronary artery of native heart 07/05/2019   Chronic kidney disease, stage 3a ([place]) 07/05/2019   Combined systolic and diastolic heart failure ([place]) 09/26/2018   Malignant neoplasm of  prostate ([place]) 06/17/2018   Medication management    Dysphagia    Ischemic cardiomyopathy    Congestive dilated cardiomyopathy (HCC)    Acute on chronic combined systolic and diastolic CHF (congestive heart failure) ([place]) 01/02/2017   Malignant neoplasm of pyriform sinus ([place]) 09/28/2013   Piriform sinus tumor 09/24/2013   NONSPECIFIC ABN FINDING RAD & OTH EXAM GI TRACT 05/14/2009    Past Medical History:  Diagnosis Date   Chronic combined systolic and diastolic CHF (congestive heart failure) ([place])    followed by [name]   COPD (chronic obstructive pulmonary disease) ([place])    Coronary artery disease cardiologist-- [name]   per cardiac cath 01-05-2017  chronic total occlusion pLCx with collaterals, 99% severe calcified prox. to mid RCA, otherwise mild to moderate CAD (medically managed)   Esophageal cancer, stage IIIB [facility]) oncologist-  dr ennever/  dr moody   dx 2010  SCC Stage IIIB completed chemoradiation;  localized recurrent left piriform sinus 01/ 2015,  completed concurrent chemoradiatoin 04/ 2015   GERD (gastroesophageal reflux disease)    09-07-2018   no issues since Gtube removed 06/ 2019   Headache    History of alcohol abuse    quit 2001   History of cancer chemotherapy    2010;   2015   History of radiation therapy    10-19-2013 to  12-05-2013 pyriform sinus 69.96 Gy/106fx;   Radiation completed 2010 for esophageal cancer   History of seizure 2001   alcohol withdrawal   HOH (hard of hearing)  Hyperlipidemia    Hypertension    Ischemic cardiomyopathy 12/2016   01-03-2017  echo,  ef 10-15%/   echo 05-06-2017 EF improved to 45-50%   Lower urinary tract symptoms (LUTS)    Prostate cancer [facility]) urologist-  dr ottelin/  oncologist-- [name]   dx 05-27-2018--- Stage T1c,  Gleason 4+3,  PSA 9.3--  scheduled for brachytherapy 09-23-2017   Pure hypercholesterolemia 09/23/2020   Renal cyst, left    Voice hoarseness    secondary to radiation treatment     Past Surgical History:  Procedure Laterality Date   CARDIOVASCULAR STRESS TEST  10/09/09   normal nuclearr stress test, EF 57% ([place])   IR GASTROSTOMY TUBE MOD SED  01/13/2017   IR GASTROSTOMY TUBE REMOVAL  02/03/2018   IR PATIENT EVAL TECH 0-60 MINS  03/25/2017   IR REMOVAL TUN ACCESS W/ PORT W/O FL MOD SED  01/15/2017   IR REPLACE G-TUBE SIMPLE WO FLUORO  01/13/2018   LARYNGOSCOPY N/A 09/15/2013   Procedure: LARYNGOSCOPY;  Surgeon: [name], MD;  Location: [facility];  Service: ENT;  Laterality: N/A;  direct laryngoscopy with biopsy and esophagoscopy   RADIOACTIVE SEED IMPLANT N/A 09/23/2018   Procedure: RADIOACTIVE SEED IMPLANT/BRACHYTHERAPY IMPLANT;  Surgeon: [name], MD;  Location: [facility];  Service: Urology;  Laterality: N/A;   RIGHT/LEFT HEART CATH AND CORONARY ANGIOGRAPHY N/A 01/05/2017   Procedure: Right/Left Heart Cath and Coronary Angiography;  Surgeon: [name], MD;  Location: [place] CV LAB;  Service: Cardiovascular;  Laterality: N/A;   TRANSTHORACIC ECHOCARDIOGRAM  05/06/2017   ef 45-50%,  grade 1 diastolic dysfunction/  AV severe calcified non coronary cusp with moderate regurg. , no stenosis (valve area per echo 01-05-2017 1.08cm^2)/  mild MR, TR, and PR/  mild LAE    Social History   Tobacco Use   Smoking status: Former    Packs/day: 1.00    Years: 40.00    Pack years: 40.00    Types: Cigarettes    Start date: 11/08/1960    Quit date: 08/31/2009    Years since quitting: 11.7   Smokeless tobacco: Former    Types: Chew    Quit date: 09/01/1999  Vaping Use   Vaping Use: Never used  Substance Use Topics   Alcohol use: Not Currently    Alcohol/week: 0.0 standard drinks    Comment: quit in 2001   Drug use: No    Comment: back in the day used cocaine,alcohol, marijuana    Family History  Problem Relation Age of Onset   Breast cancer Mother    Prostate cancer Neg Hx    Colon cancer Neg Hx    Pancreatic cancer Neg Hx      No Known Allergies  Medication list has been reviewed and updated.  Current Outpatient Medications on File Prior to Visit  Medication Sig Dispense Refill   albuterol (VENTOLIN HFA) 108 (90 Base) MCG/ACT inhaler INHALE 2 PUFFS INTO THE LUNGS EVERY 6 HOURS AS NEEDED FOR WHEEZING OR SHORTNESS OF BREATH (Patient taking differently: Inhale 2 puffs into the lungs every 6 (six) hours as needed for wheezing or shortness of breath.) 20.1 g 1   aspirin EC 81 MG tablet Take 81 mg by mouth daily.     dapagliflozin propanediol (FARXIGA) 10 MG TABS tablet Take 1 tablet (10 mg total) by mouth daily. 30 tablet 2   ENTRESTO 24-26 MG TAKE 1 TABLET BY MOUTH TWICE DAILY (Patient taking differently: Take 1 tablet by  mouth daily.) 180 tablet 3   Evolocumab (REPATHA SURECLICK) 242 MG/ML SOAJ Inject 140 mg into the skin every 14 (fourteen) days. 2 mL 11   fentaNYL (DURAGESIC) 12 MCG/HR Place 1 patch onto the skin every 3 (three) days. 10 patch 0   furosemide (LASIX) 40 MG tablet Take 40 mg of Lasix if you gain weight more than 2 pounds per day or more than 5 pounds per week 30 tablet 2   metoprolol succinate (TOPROL-XL) 25 MG 24 hr tablet Take 0.5 tablets (12.5 mg total) by mouth daily. 30 tablet 2   Multiple Vitamin (MULTIVITAMIN) tablet Take 1 tablet by mouth daily.     polyethylene glycol (MIRALAX / GLYCOLAX) 17 g packet Take 17 g by mouth daily as needed for mild constipation. 14 each 0   pravastatin (PRAVACHOL) 80 MG tablet Take 1 tablet (80 mg total) by mouth every evening. 90 tablet 3   Current Facility-Administered Medications on File Prior to Visit  Medication Dose Route Frequency Provider Last Rate Last Admin   topical emolient (BIAFINE) emulsion   Topical Daily Kyung Rudd, MD   Given at 01/19/14 3536    Review of Systems:  As per HPI- otherwise negative.   Physical Examination: There were no vitals filed for this visit. There were no vitals filed for this visit. There is no height or weight  on file to calculate BMI. Ideal Body Weight:    GEN: no acute distress. HEENT: Atraumatic, Normocephalic.  Ears and Nose: No external deformity. CV: RRR, No M/G/R. No JVD. No thrill. No extra heart sounds. PULM: CTA B, no wheezes, crackles, rhonchi. No retractions. No resp. distress. No accessory muscle use. ABD: S, NT, ND, +BS. No rebound. No HSM. EXTR: No c/c/e PSYCH: Normally interactive. Conversant.    Assessment and Plan: ***  Signed Lamar Blinks, MD

## 2021-06-11 ENCOUNTER — Emergency Department (HOSPITAL_COMMUNITY): Payer: Medicare Other

## 2021-06-11 ENCOUNTER — Ambulatory Visit: Payer: Medicare Other | Admitting: Family Medicine

## 2021-06-11 ENCOUNTER — Encounter (HOSPITAL_COMMUNITY): Payer: Self-pay

## 2021-06-11 ENCOUNTER — Telehealth: Payer: Self-pay | Admitting: Cardiovascular Disease

## 2021-06-11 ENCOUNTER — Telehealth: Payer: Self-pay | Admitting: Family Medicine

## 2021-06-11 ENCOUNTER — Inpatient Hospital Stay (HOSPITAL_COMMUNITY): Payer: Medicare Other

## 2021-06-11 ENCOUNTER — Inpatient Hospital Stay (HOSPITAL_COMMUNITY)
Admission: EM | Admit: 2021-06-11 | Discharge: 2021-06-24 | DRG: 175 | Disposition: A | Discharge status: Home / Self Care | Payer: Medicare Other | Attending: Family Medicine | Admitting: Family Medicine

## 2021-06-11 ENCOUNTER — Other Ambulatory Visit: Payer: Self-pay

## 2021-06-11 ENCOUNTER — Other Ambulatory Visit (HOSPITAL_COMMUNITY): Payer: Medicare Other

## 2021-06-11 DIAGNOSIS — Z8501 Personal history of malignant neoplasm of esophagus: Secondary | ICD-10-CM

## 2021-06-11 DIAGNOSIS — I5043 Acute on chronic combined systolic (congestive) and diastolic (congestive) heart failure: Secondary | ICD-10-CM

## 2021-06-11 DIAGNOSIS — N179 Acute kidney failure, unspecified: Secondary | ICD-10-CM | POA: Diagnosis present

## 2021-06-11 DIAGNOSIS — R0602 Shortness of breath: Secondary | ICD-10-CM

## 2021-06-11 DIAGNOSIS — Z955 Presence of coronary angioplasty implant and graft: Secondary | ICD-10-CM

## 2021-06-11 DIAGNOSIS — J9601 Acute respiratory failure with hypoxia: Secondary | ICD-10-CM | POA: Diagnosis not present

## 2021-06-11 DIAGNOSIS — Z743 Need for continuous supervision: Secondary | ICD-10-CM | POA: Diagnosis not present

## 2021-06-11 DIAGNOSIS — Z79899 Other long term (current) drug therapy: Secondary | ICD-10-CM

## 2021-06-11 DIAGNOSIS — I2693 Single subsegmental pulmonary embolism without acute cor pulmonale: Secondary | ICD-10-CM | POA: Diagnosis not present

## 2021-06-11 DIAGNOSIS — R739 Hyperglycemia, unspecified: Secondary | ICD-10-CM | POA: Diagnosis present

## 2021-06-11 DIAGNOSIS — D696 Thrombocytopenia, unspecified: Secondary | ICD-10-CM | POA: Diagnosis not present

## 2021-06-11 DIAGNOSIS — R7989 Other specified abnormal findings of blood chemistry: Secondary | ICD-10-CM | POA: Diagnosis not present

## 2021-06-11 DIAGNOSIS — I472 Ventricular tachycardia, unspecified: Secondary | ICD-10-CM | POA: Diagnosis not present

## 2021-06-11 DIAGNOSIS — Z681 Body mass index (BMI) 19 or less, adult: Secondary | ICD-10-CM | POA: Diagnosis not present

## 2021-06-11 DIAGNOSIS — R188 Other ascites: Secondary | ICD-10-CM | POA: Diagnosis not present

## 2021-06-11 DIAGNOSIS — I2699 Other pulmonary embolism without acute cor pulmonale: Secondary | ICD-10-CM | POA: Diagnosis not present

## 2021-06-11 DIAGNOSIS — E7800 Pure hypercholesterolemia, unspecified: Secondary | ICD-10-CM | POA: Diagnosis present

## 2021-06-11 DIAGNOSIS — I5020 Unspecified systolic (congestive) heart failure: Secondary | ICD-10-CM | POA: Diagnosis not present

## 2021-06-11 DIAGNOSIS — E43 Unspecified severe protein-calorie malnutrition: Secondary | ICD-10-CM | POA: Diagnosis present

## 2021-06-11 DIAGNOSIS — J449 Chronic obstructive pulmonary disease, unspecified: Secondary | ICD-10-CM | POA: Diagnosis not present

## 2021-06-11 DIAGNOSIS — D684 Acquired coagulation factor deficiency: Secondary | ICD-10-CM | POA: Diagnosis not present

## 2021-06-11 DIAGNOSIS — I2601 Septic pulmonary embolism with acute cor pulmonale: Secondary | ICD-10-CM | POA: Diagnosis not present

## 2021-06-11 DIAGNOSIS — J440 Chronic obstructive pulmonary disease with acute lower respiratory infection: Secondary | ICD-10-CM | POA: Diagnosis not present

## 2021-06-11 DIAGNOSIS — I5082 Biventricular heart failure: Secondary | ICD-10-CM | POA: Diagnosis present

## 2021-06-11 DIAGNOSIS — I420 Dilated cardiomyopathy: Secondary | ICD-10-CM

## 2021-06-11 DIAGNOSIS — R64 Cachexia: Secondary | ICD-10-CM | POA: Diagnosis present

## 2021-06-11 DIAGNOSIS — D6959 Other secondary thrombocytopenia: Secondary | ICD-10-CM | POA: Diagnosis present

## 2021-06-11 DIAGNOSIS — I5023 Acute on chronic systolic (congestive) heart failure: Secondary | ICD-10-CM

## 2021-06-11 DIAGNOSIS — Z87891 Personal history of nicotine dependence: Secondary | ICD-10-CM

## 2021-06-11 DIAGNOSIS — I255 Ischemic cardiomyopathy: Secondary | ICD-10-CM | POA: Diagnosis present

## 2021-06-11 DIAGNOSIS — J90 Pleural effusion, not elsewhere classified: Secondary | ICD-10-CM

## 2021-06-11 DIAGNOSIS — J918 Pleural effusion in other conditions classified elsewhere: Secondary | ICD-10-CM | POA: Diagnosis present

## 2021-06-11 DIAGNOSIS — J189 Pneumonia, unspecified organism: Secondary | ICD-10-CM | POA: Diagnosis present

## 2021-06-11 DIAGNOSIS — N183 Chronic kidney disease, stage 3 unspecified: Secondary | ICD-10-CM

## 2021-06-11 DIAGNOSIS — I5021 Acute systolic (congestive) heart failure: Secondary | ICD-10-CM | POA: Diagnosis not present

## 2021-06-11 DIAGNOSIS — K219 Gastro-esophageal reflux disease without esophagitis: Secondary | ICD-10-CM | POA: Diagnosis present

## 2021-06-11 DIAGNOSIS — Z923 Personal history of irradiation: Secondary | ICD-10-CM

## 2021-06-11 DIAGNOSIS — I50811 Acute right heart failure: Secondary | ICD-10-CM | POA: Diagnosis not present

## 2021-06-11 DIAGNOSIS — K7200 Acute and subacute hepatic failure without coma: Secondary | ICD-10-CM | POA: Diagnosis present

## 2021-06-11 DIAGNOSIS — Z66 Do not resuscitate: Secondary | ICD-10-CM | POA: Diagnosis not present

## 2021-06-11 DIAGNOSIS — N1831 Chronic kidney disease, stage 3a: Secondary | ICD-10-CM | POA: Diagnosis not present

## 2021-06-11 DIAGNOSIS — R54 Age-related physical debility: Secondary | ICD-10-CM | POA: Diagnosis present

## 2021-06-11 DIAGNOSIS — E782 Mixed hyperlipidemia: Secondary | ICD-10-CM | POA: Diagnosis present

## 2021-06-11 DIAGNOSIS — I272 Pulmonary hypertension, unspecified: Secondary | ICD-10-CM | POA: Diagnosis not present

## 2021-06-11 DIAGNOSIS — D6489 Other specified anemias: Secondary | ICD-10-CM | POA: Diagnosis present

## 2021-06-11 DIAGNOSIS — Z20822 Contact with and (suspected) exposure to covid-19: Secondary | ICD-10-CM | POA: Diagnosis not present

## 2021-06-11 DIAGNOSIS — R091 Pleurisy: Secondary | ICD-10-CM | POA: Diagnosis not present

## 2021-06-11 DIAGNOSIS — Z7982 Long term (current) use of aspirin: Secondary | ICD-10-CM

## 2021-06-11 DIAGNOSIS — E871 Hypo-osmolality and hyponatremia: Secondary | ICD-10-CM | POA: Diagnosis not present

## 2021-06-11 DIAGNOSIS — R531 Weakness: Secondary | ICD-10-CM

## 2021-06-11 DIAGNOSIS — I2609 Other pulmonary embolism with acute cor pulmonale: Secondary | ICD-10-CM | POA: Diagnosis not present

## 2021-06-11 DIAGNOSIS — Z515 Encounter for palliative care: Secondary | ICD-10-CM | POA: Diagnosis not present

## 2021-06-11 DIAGNOSIS — Y95 Nosocomial condition: Secondary | ICD-10-CM | POA: Diagnosis present

## 2021-06-11 DIAGNOSIS — Z85819 Personal history of malignant neoplasm of unspecified site of lip, oral cavity, and pharynx: Secondary | ICD-10-CM

## 2021-06-11 DIAGNOSIS — R069 Unspecified abnormalities of breathing: Secondary | ICD-10-CM | POA: Diagnosis not present

## 2021-06-11 DIAGNOSIS — D649 Anemia, unspecified: Secondary | ICD-10-CM | POA: Diagnosis not present

## 2021-06-11 DIAGNOSIS — Z7901 Long term (current) use of anticoagulants: Secondary | ICD-10-CM

## 2021-06-11 DIAGNOSIS — R570 Cardiogenic shock: Secondary | ICD-10-CM | POA: Diagnosis present

## 2021-06-11 DIAGNOSIS — K5900 Constipation, unspecified: Secondary | ICD-10-CM | POA: Diagnosis not present

## 2021-06-11 DIAGNOSIS — I130 Hypertensive heart and chronic kidney disease with heart failure and stage 1 through stage 4 chronic kidney disease, or unspecified chronic kidney disease: Secondary | ICD-10-CM | POA: Diagnosis present

## 2021-06-11 DIAGNOSIS — I2510 Atherosclerotic heart disease of native coronary artery without angina pectoris: Secondary | ICD-10-CM | POA: Diagnosis present

## 2021-06-11 DIAGNOSIS — I499 Cardiac arrhythmia, unspecified: Secondary | ICD-10-CM | POA: Diagnosis not present

## 2021-06-11 DIAGNOSIS — R0689 Other abnormalities of breathing: Secondary | ICD-10-CM | POA: Diagnosis not present

## 2021-06-11 DIAGNOSIS — I160 Hypertensive urgency: Secondary | ICD-10-CM

## 2021-06-11 DIAGNOSIS — T501X5A Adverse effect of loop [high-ceiling] diuretics, initial encounter: Secondary | ICD-10-CM | POA: Diagnosis present

## 2021-06-11 DIAGNOSIS — I4729 Other ventricular tachycardia: Secondary | ICD-10-CM | POA: Diagnosis not present

## 2021-06-11 DIAGNOSIS — Z803 Family history of malignant neoplasm of breast: Secondary | ICD-10-CM

## 2021-06-11 DIAGNOSIS — R7401 Elevation of levels of liver transaminase levels: Secondary | ICD-10-CM | POA: Diagnosis not present

## 2021-06-11 DIAGNOSIS — G8929 Other chronic pain: Secondary | ICD-10-CM | POA: Diagnosis present

## 2021-06-11 DIAGNOSIS — J939 Pneumothorax, unspecified: Secondary | ICD-10-CM

## 2021-06-11 DIAGNOSIS — J441 Chronic obstructive pulmonary disease with (acute) exacerbation: Secondary | ICD-10-CM | POA: Diagnosis not present

## 2021-06-11 DIAGNOSIS — Z8546 Personal history of malignant neoplasm of prostate: Secondary | ICD-10-CM

## 2021-06-11 DIAGNOSIS — K828 Other specified diseases of gallbladder: Secondary | ICD-10-CM | POA: Diagnosis not present

## 2021-06-11 DIAGNOSIS — I38 Endocarditis, valve unspecified: Secondary | ICD-10-CM | POA: Diagnosis not present

## 2021-06-11 DIAGNOSIS — Z9221 Personal history of antineoplastic chemotherapy: Secondary | ICD-10-CM

## 2021-06-11 DIAGNOSIS — I493 Ventricular premature depolarization: Secondary | ICD-10-CM | POA: Diagnosis present

## 2021-06-11 LAB — CBC WITH DIFFERENTIAL/PLATELET
Abs Immature Granulocytes: 0.01 10*3/uL (ref 0.00–0.07)
Basophils Absolute: 0 10*3/uL (ref 0.0–0.1)
Basophils Relative: 0 %
Eosinophils Absolute: 0 10*3/uL (ref 0.0–0.5)
Eosinophils Relative: 1 %
HCT: 41 % (ref 39.0–52.0)
Hemoglobin: 13 g/dL (ref 13.0–17.0)
Immature Granulocytes: 0 %
Lymphocytes Relative: 13 %
Lymphs Abs: 0.6 10*3/uL — ABNORMAL LOW (ref 0.7–4.0)
MCH: 27 pg (ref 26.0–34.0)
MCHC: 31.7 g/dL (ref 30.0–36.0)
MCV: 85.2 fL (ref 80.0–100.0)
Monocytes Absolute: 0.4 10*3/uL (ref 0.1–1.0)
Monocytes Relative: 10 %
Neutro Abs: 3.2 10*3/uL (ref 1.7–7.7)
Neutrophils Relative %: 76 %
Platelets: 207 10*3/uL (ref 150–400)
RBC: 4.81 MIL/uL (ref 4.22–5.81)
RDW: 14.9 % (ref 11.5–15.5)
WBC: 4.2 10*3/uL (ref 4.0–10.5)
nRBC: 0 % (ref 0.0–0.2)

## 2021-06-11 LAB — HEPARIN LEVEL (UNFRACTIONATED)
Heparin Unfractionated: <0.1 IU/mL — ABNORMAL LOW (ref 0.30–0.70)
Heparin Unfractionated: 0.46 IU/mL (ref 0.30–0.70)

## 2021-06-11 LAB — PROTEIN, PLEURAL OR PERITONEAL FLUID: Total protein, fluid: <3 g/dL

## 2021-06-11 LAB — PROTIME-INR
INR: 1.2 (ref 0.8–1.2)
Prothrombin Time: 15 seconds (ref 11.4–15.2)

## 2021-06-11 LAB — APTT: aPTT: 25 seconds (ref 24–36)

## 2021-06-11 LAB — TROPONIN I (HIGH SENSITIVITY)
Troponin I (High Sensitivity): 37 ng/L — ABNORMAL HIGH (ref <18)
Troponin I (High Sensitivity): 38 ng/L — ABNORMAL HIGH (ref <18)

## 2021-06-11 LAB — COMPREHENSIVE METABOLIC PANEL
ALT: 18 U/L (ref 0–44)
AST: 31 U/L (ref 15–41)
Albumin: 4 g/dL (ref 3.5–5.0)
Alkaline Phosphatase: 38 U/L (ref 38–126)
Anion gap: 11 (ref 5–15)
BUN: 28 mg/dL — ABNORMAL HIGH (ref 8–23)
CO2: 24 mmol/L (ref 22–32)
Calcium: 9.1 mg/dL (ref 8.9–10.3)
Chloride: 97 mmol/L — ABNORMAL LOW (ref 98–111)
Creatinine, Ser: 1.58 mg/dL — ABNORMAL HIGH (ref 0.61–1.24)
GFR, Estimated: 47 mL/min — ABNORMAL LOW (ref >60)
Glucose, Bld: 136 mg/dL — ABNORMAL HIGH (ref 70–99)
Potassium: 3.8 mmol/L (ref 3.5–5.1)
Sodium: 132 mmol/L — ABNORMAL LOW (ref 135–145)
Total Bilirubin: 0.7 mg/dL (ref 0.3–1.2)
Total Protein: 7.4 g/dL (ref 6.5–8.1)

## 2021-06-11 LAB — LACTATE DEHYDROGENASE, PLEURAL OR PERITONEAL FLUID: LD, Fluid: 75 U/L — ABNORMAL HIGH (ref 3–23)

## 2021-06-11 LAB — BODY FLUID CELL COUNT WITH DIFFERENTIAL
Eos, Fluid: 0 %
Lymphs, Fluid: 77 %
Monocyte-Macrophage-Serous Fluid: 14 % — ABNORMAL LOW (ref 50–90)
Neutrophil Count, Fluid: 9 % (ref 0–25)
Total Nucleated Cell Count, Fluid: 405 cu mm (ref 0–1000)

## 2021-06-11 LAB — GLUCOSE, PLEURAL OR PERITONEAL FLUID: Glucose, Fluid: 148 mg/dL

## 2021-06-11 LAB — RESP PANEL BY RT-PCR (FLU A&B, COVID) ARPGX2
Influenza A by PCR: NEGATIVE
Influenza B by PCR: NEGATIVE
SARS Coronavirus 2 by RT PCR: NEGATIVE

## 2021-06-11 LAB — ECHOCARDIOGRAM LIMITED
Height: 66 in
Weight: 2186.96 oz

## 2021-06-11 LAB — BRAIN NATRIURETIC PEPTIDE: B Natriuretic Peptide: >4500 pg/mL — ABNORMAL HIGH (ref 0.0–100.0)

## 2021-06-11 LAB — LACTATE DEHYDROGENASE: LDH: 213 U/L — ABNORMAL HIGH (ref 98–192)

## 2021-06-11 MED ORDER — DOXYCYCLINE HYCLATE 100 MG PO TABS
100.0000 mg | ORAL_TABLET | Freq: Once | ORAL | Status: AC
  Administered 2021-06-11: 100 mg via ORAL
  Filled 2021-06-11: qty 1

## 2021-06-11 MED ORDER — IOHEXOL 350 MG/ML SOLN
80.0000 mL | Freq: Once | INTRAVENOUS | Status: AC | PRN
  Administered 2021-06-11: 80 mL via INTRAVENOUS

## 2021-06-11 MED ORDER — ACETAMINOPHEN 650 MG RE SUPP: 650.0000 mg | Freq: Four times a day (QID) | RECTAL | Status: DC | PRN

## 2021-06-11 MED ORDER — FUROSEMIDE 10 MG/ML IJ SOLN
40.0000 mg | Freq: Two times a day (BID) | INTRAMUSCULAR | Status: DC
  Administered 2021-06-11 – 2021-06-12 (×2): 40 mg via INTRAVENOUS
  Filled 2021-06-11 (×2): qty 4

## 2021-06-11 MED ORDER — HEPARIN BOLUS VIA INFUSION
3000.0000 [IU] | Freq: Once | INTRAVENOUS | Status: AC
  Administered 2021-06-11: 3000 [IU] via INTRAVENOUS
  Filled 2021-06-11: qty 3000

## 2021-06-11 MED ORDER — ONDANSETRON HCL 4 MG/2ML IJ SOLN
4.0000 mg | Freq: Once | INTRAMUSCULAR | Status: AC
  Administered 2021-06-11: 4 mg via INTRAVENOUS
  Filled 2021-06-11: qty 2

## 2021-06-11 MED ORDER — GUAIFENESIN ER 600 MG PO TB12
600.0000 mg | ORAL_TABLET | Freq: Two times a day (BID) | ORAL | Status: DC
  Administered 2021-06-11 – 2021-06-24 (×26): 600 mg via ORAL
  Filled 2021-06-11 (×26): qty 1

## 2021-06-11 MED ORDER — PHENOL 1.4 % MT LIQD
1.0000 | OROMUCOSAL | Status: DC | PRN
  Administered 2021-06-11: 1 via OROMUCOSAL
  Filled 2021-06-11: qty 177

## 2021-06-11 MED ORDER — SODIUM CHLORIDE (PF) 0.9 % IJ SOLN
INTRAMUSCULAR | Status: AC
  Filled 2021-06-11: qty 50

## 2021-06-11 MED ORDER — HEPARIN (PORCINE) 25000 UT/250ML-% IV SOLN
1100.0000 [IU]/h | INTRAVENOUS | Status: DC
  Administered 2021-06-11 – 2021-06-12 (×2): 1100 [IU]/h via INTRAVENOUS
  Filled 2021-06-11 (×2): qty 250

## 2021-06-11 MED ORDER — SODIUM CHLORIDE 0.9 % IV SOLN
1.0000 g | Freq: Once | INTRAVENOUS | Status: AC
  Administered 2021-06-11: 1 g via INTRAVENOUS
  Filled 2021-06-11: qty 10

## 2021-06-11 MED ORDER — ACETAMINOPHEN 325 MG PO TABS
650.0000 mg | ORAL_TABLET | Freq: Four times a day (QID) | ORAL | Status: DC | PRN
  Administered 2021-06-12 – 2021-06-13 (×4): 650 mg via ORAL
  Filled 2021-06-11 (×4): qty 2

## 2021-06-11 MED ORDER — LEVALBUTEROL HCL 0.63 MG/3ML IN NEBU
0.6300 mg | INHALATION_SOLUTION | Freq: Four times a day (QID) | RESPIRATORY_TRACT | Status: DC | PRN
  Administered 2021-06-13: 0.63 mg via RESPIRATORY_TRACT
  Filled 2021-06-11: qty 3

## 2021-06-11 NOTE — Progress Notes (Signed)
  Echocardiogram 2D Echocardiogram has been performed.  Collin Young 06/11/2021, 2:42 PM

## 2021-06-11 NOTE — Consult Note (Signed)
NAME:  Collin Young, MRN:  [id number], DOB:  [birth year], LOS: 0 ADMISSION DATE:  06/11/2021, CONSULTATION DATE:  06/11/2021 REFERRING MD:  Dr. Marylyn Ishihara, CHIEF COMPLAINT:  Pleural effusion    History of Present Illness:  Collin Young is a 70 y.o. male with a PMH significant for systolic and diastolic CHF, COPD, prior smoker, CAD, esophageal and prostate cancer s/p chemo and radiation, GERD, prior ETOH abuse, seizures, HTN, and HLD who presented to the ED  for complaints of shortness of breath.   On ED admit patient was treated for acute COPD exacerbation with BDs and supplemental oxygen. CTA chest was obtained that revealed lobar/subsegmental PE's on right with large right and small left pleural effusions. Given PE and pleural effusion PCCM consult for pulmonary eval.   Of note patient was recently admitted at this facility 10/3 - 10/4 for similar presented and was treated for acute CHF exacerbation.  Pertinent  Medical History  Systolic and diastolic CHF, COPD, prior smoker, CAD, esophageal and prostate cancer s/p chemo and radiation, GERD, prior ETOH abuse, seizures, HTN, and HLD   Significant Hospital Events:  10/12 admitted for SOB seen with small right subsegmental PE and plerual effusions  Interim History / Subjective:  Seen lying in bed in NAD  States dyspnea has improved   Objective   Blood pressure (!) 153/97, pulse (!) 105, temperature 98.6 F (37 C), resp. rate 19, height 5\' 6"  (1.676 m), weight 62 kg, SpO2 100 %.        Intake/Output Summary (Last 24 hours) at 06/11/2021 1200 Last data filed at 06/11/2021 0554 Gross per 24 hour  Intake 100 ml  Output --  Net 100 ml   Filed Weights   06/11/21 0109  Weight: 62 kg    Examination: General: Very pleasant thin elderly male lying on ED stretcher in NAD HEENT: [place]/AT, MM pink/moist, PERRL, hoarse voice Neuro: Alert and oriented x3, non-focal  CV: s1s2 regular rate and rhythm, no murmur, rubs, or gallops,  PULM:   Bilateral expiratory wheeze, no increased work of breathing GI: soft, bowel sounds active in all 4 quadrants, non-tender, non-distended, tolerating oral diet  Extremities: warm/dry, no edema  Skin: no rashes or lesions  Resolved Hospital Problem list     Assessment & Plan:  Right subsegmental PE - CTA chest 10/12 was obtained that revealed lobar/subsegmental PE's on right Right greater than left pleural effusions  -Again seen on chest CT on admit  HX of COPD with concern for acute exacerbation on admit -On EMS arrival patient was seen with rhonchi and wheezing bilaterally Former smoker  -Quit 65yrs ago P: Continue hep drip  Encourage pulmonary hygiene Complete thoracentesis to obtain pleural fluid including cytology and culture Follow pleural studies Mobilize as able Continue bronchodilators Empiric antibiotics per primary Follow-up with outpatient pulmonary upon discharge  PCCM will follow chart peripherally until pleural fluid studies result  Best Practice   Per primary  Labs   CBC: Recent Labs  Lab 06/11/21 0225  WBC 4.2  NEUTROABS 3.2  HGB 13.0  HCT 41.0  MCV 85.2  PLT 706    Basic Metabolic Panel: Recent Labs  Lab 06/11/21 0225  NA 132*  K 3.8  CL 97*  CO2 24  GLUCOSE 136*  BUN 28*  CREATININE 1.58*  CALCIUM 9.1   GFR: Estimated Creatinine Clearance: 38.2 mL/min (A) (by C-G formula based on SCr of 1.58 mg/dL (H)). Recent Labs  Lab 06/11/21 0225  WBC 4.2  Liver Function Tests: Recent Labs  Lab 06/11/21 0225  AST 31  ALT 18  ALKPHOS 38  BILITOT 0.7  PROT 7.4  ALBUMIN 4.0   No results for input(s): LIPASE, AMYLASE in the last 168 hours. No results for input(s): AMMONIA in the last 168 hours.  ABG    Component Value Date/Time   PHART 7.477 (H) 01/07/2017 1605   PCO2ART 25.9 (L) 01/07/2017 1605   PO2ART 70.1 (L) 01/07/2017 1605   HCO3 18.9 (L) 01/07/2017 1605   TCO2 19 01/05/2017 1604   TCO2 29 01/05/2017 1604   ACIDBASEDEF  4.1 (H) 01/07/2017 1605   O2SAT 51.8 01/17/2017 0540     Coagulation Profile: Recent Labs  Lab 06/11/21 0536  INR 1.2    Cardiac Enzymes: No results for input(s): CKTOTAL, CKMB, CKMBINDEX, TROPONINI in the last 168 hours.  HbA1C: Hgb A1c MFr Bld  Date/Time Value Ref Range Status  09/26/2018 04:24 PM 5.8 (H) 4.8 - 5.6 % Final    Comment:             Prediabetes: 5.7 - 6.4          Diabetes: >6.4          Glycemic control for adults with diabetes: <7.0     CBG: No results for input(s): GLUCAP in the last 168 hours.  Review of Systems:   Please see the history of present illness. All other systems reviewed and are negative   Past Medical History:  He,  has a past medical history of Chronic combined systolic and diastolic CHF (congestive heart failure) ([place]), COPD (chronic obstructive pulmonary disease) ([place]), Coronary artery disease (cardiologist-- [name]), Esophageal cancer, stage IIIB [facility]) (oncologist-  dr ennever/  dr moody), GERD (gastroesophageal reflux disease), Headache, History of alcohol abuse, History of cancer chemotherapy, History of radiation therapy, History of seizure (2001), HOH (hard of hearing), Hyperlipidemia, Hypertension, Ischemic cardiomyopathy (12/2016), Lower urinary tract symptoms (LUTS), Prostate cancer [facility]) (urologist-  dr ottelin/  oncologist-- [name]), Pure hypercholesterolemia (09/23/2020), Renal cyst, left, and Voice hoarseness.   Surgical History:   Past Surgical History:  Procedure Laterality Date   CARDIOVASCULAR STRESS TEST  10/09/09   normal nuclearr stress test, EF 57% ([place])   IR GASTROSTOMY TUBE MOD SED  01/13/2017   IR GASTROSTOMY TUBE REMOVAL  02/03/2018   IR PATIENT EVAL TECH 0-60 MINS  03/25/2017   IR REMOVAL TUN ACCESS W/ PORT W/O FL MOD SED  01/15/2017   IR REPLACE G-TUBE SIMPLE WO FLUORO  01/13/2018   LARYNGOSCOPY N/A 09/15/2013   Procedure: LARYNGOSCOPY;  Surgeon: [name], MD;  Location: [place];  Service: ENT;   Laterality: N/A;  direct laryngoscopy with biopsy and esophagoscopy   RADIOACTIVE SEED IMPLANT N/A 09/23/2018   Procedure: RADIOACTIVE SEED IMPLANT/BRACHYTHERAPY IMPLANT;  Surgeon: [name], MD;  Location: [facility];  Service: Urology;  Laterality: N/A;   RIGHT/LEFT HEART CATH AND CORONARY ANGIOGRAPHY N/A 01/05/2017   Procedure: Right/Left Heart Cath and Coronary Angiography;  Surgeon: [name], MD;  Location: [place] CV LAB;  Service: Cardiovascular;  Laterality: N/A;   TRANSTHORACIC ECHOCARDIOGRAM  05/06/2017   ef 45-50%,  grade 1 diastolic dysfunction/  AV severe calcified non coronary cusp with moderate regurg. , no stenosis (valve area per echo 01-05-2017 1.08cm^2)/  mild MR, TR, and PR/  mild LAE     Social History:   reports that he quit smoking about 11 years ago. His smoking use included cigarettes.  He started smoking about 60 years ago. He has a 40.00 pack-year smoking history. He quit smokeless tobacco use about 21 years ago.  His smokeless tobacco use included chew. He reports that he does not currently use alcohol. He reports that he does not use drugs.   Family History:  His family history includes Breast cancer in his mother. There is no history of Prostate cancer, Colon cancer, or Pancreatic cancer.   Allergies No Known Allergies   Home Medications  Prior to Admission medications   Medication Sig Start Date End Date Taking? Authorizing Provider  ENTRESTO 24-26 MG TAKE 1 TABLET BY MOUTH TWICE DAILY Patient taking differently: Take 1 tablet by mouth 2 (two) times daily. 11/11/20  Yes Skeet Latch, MD  furosemide (LASIX) 40 MG tablet Take 40 mg of Lasix if you gain weight more than 2 pounds per day or more than 5 pounds per week Patient taking differently: Take 40 mg by mouth 3 (three) times a week. Monday Wednesday Friday 05/11/21  Yes Georgette Shell, MD  metoprolol succinate (TOPROL-XL) 25 MG 24 hr tablet Take 0.5 tablets (12.5  mg total) by mouth daily. 05/12/21  Yes Georgette Shell, MD  albuterol (VENTOLIN HFA) 108 (90 Base) MCG/ACT inhaler INHALE 2 PUFFS INTO THE LUNGS EVERY 6 HOURS AS NEEDED FOR WHEEZING OR SHORTNESS OF BREATH Patient taking differently: Inhale 2 puffs into the lungs every 6 (six) hours as needed for wheezing or shortness of breath. 03/28/21   Copland, Gay Filler, MD  aspirin EC 81 MG tablet Take 81 mg by mouth daily.    [provider]  dapagliflozin propanediol (FARXIGA) 10 MG TABS tablet Take 1 tablet (10 mg total) by mouth daily. 05/12/21   Georgette Shell, MD  Evolocumab (REPATHA SURECLICK) 585 MG/ML SOAJ Inject 140 mg into the skin every 14 (fourteen) days. 11/14/20   Skeet Latch, MD  fentaNYL (DURAGESIC) 12 MCG/HR Place 1 patch onto the skin every 3 (three) days. 06/04/21   [name], MD  Multiple Vitamin (MULTIVITAMIN) tablet Take 1 tablet by mouth daily.    [provider]  polyethylene glycol (MIRALAX / GLYCOLAX) 17 g packet Take 17 g by mouth daily as needed for mild constipation. 05/11/21   Georgette Shell, MD  pravastatin (PRAVACHOL) 80 MG tablet Take 1 tablet (80 mg total) by mouth every evening. 04/30/21 07/29/21  Skeet Latch, MD     Critical care time: NA  Ramesh Moan D. Kenton Kingfisher, NP-C [facility] Personal contact information can be found on Amion  06/11/2021, 2:31 PM

## 2021-06-11 NOTE — ED Provider Notes (Signed)
[facility] DEPT Provider Note   CSN: [id number] Arrival date & time: 06/11/21  0050     History Chief Complaint  Patient presents with   Shortness of Breath    Collin Young is a 70 y.o. male.  The history is provided by the patient and medical records.  Shortness of Breath Collin Young is a 70 y.o. male who presents to the Emergency Department complaining of sob.  He presents to the ED by EMS for evaluation of SOB that started a few months ago, significantly worsened today.  No fever, chest pain.  Has increased cough from baseline.  No hemoptysis.  Has vomiting or diarrhea.  Not on oxygen.  Lives alone.  No known sick contacts.  Has a hx/o esophageal cancer.  No hx/o DVT/PE.  Received duoneb, solumedrol, mag by EMS.  Reports significant improvement in respiratory sxs since treatment by EMS.    Past Medical History:  Diagnosis Date   Chronic combined systolic and diastolic CHF (congestive heart failure) ([place])    followed by [name]   COPD (chronic obstructive pulmonary disease) ([place])    Coronary artery disease cardiologist-- [name]   per cardiac cath 01-05-2017  chronic total occlusion pLCx with collaterals, 99% severe calcified prox. to mid RCA, otherwise mild to moderate CAD (medically managed)   Esophageal cancer, stage IIIB [facility]) oncologist-  dr ennever/  dr moody   dx 2010  SCC Stage IIIB completed chemoradiation;  localized recurrent left piriform sinus 01/ 2015,  completed concurrent chemoradiatoin 04/ 2015   GERD (gastroesophageal reflux disease)    09-07-2018   no issues since Gtube removed 06/ 2019   Headache    History of alcohol abuse    quit 2001   History of cancer chemotherapy    2010;   2015   History of radiation therapy    10-19-2013 to  12-05-2013 pyriform sinus 69.96 Gy/47fx;   Radiation completed 2010 for esophageal cancer   History of seizure 2001   alcohol withdrawal   HOH (hard of hearing)     Hyperlipidemia    Hypertension    Ischemic cardiomyopathy 12/2016   01-03-2017  echo,  ef 10-15%/   echo 05-06-2017 EF improved to 45-50%   Lower urinary tract symptoms (LUTS)    Prostate cancer [facility]) urologist-  dr ottelin/  oncologist-- [name]   dx 05-27-2018--- Stage T1c,  Gleason 4+3,  PSA 9.3--  scheduled for brachytherapy 09-23-2017   Pure hypercholesterolemia 09/23/2020   Renal cyst, left    Voice hoarseness    secondary to radiation treatment    Patient Active Problem List   Diagnosis Date Noted   Pulmonary embolism ([place]) 06/11/2021   Acute on chronic heart failure ([place]) 06/02/2021   GERD without esophagitis 05/08/2021   Mixed hyperlipidemia 05/08/2021   Hypertensive urgency 05/08/2021   Elevated troponin level not due myocardial infarction 05/08/2021   Pure hypercholesterolemia 09/23/2020   Claudication in peripheral vascular disease ([place]) 01/25/2020   Late latent syphilis 01/15/2020   Coronary artery disease involving native coronary artery of native heart 07/05/2019   Chronic kidney disease, stage 3a ([place]) 07/05/2019   Combined systolic and diastolic heart failure ([place]) 09/26/2018   Malignant neoplasm of prostate ([place]) 06/17/2018   Medication management    Dysphagia    Ischemic cardiomyopathy    Congestive dilated cardiomyopathy ([place])    Acute on chronic combined systolic and diastolic CHF (congestive heart failure) ([place]) 01/02/2017   Malignant  neoplasm of pyriform sinus (HCC) 09/28/2013   Piriform sinus tumor 09/24/2013   NONSPECIFIC ABN FINDING RAD & OTH EXAM GI TRACT 05/14/2009    Past Surgical History:  Procedure Laterality Date   CARDIOVASCULAR STRESS TEST  10/09/09   normal nuclearr stress test, EF 57% ([place])   IR GASTROSTOMY TUBE MOD SED  01/13/2017   IR GASTROSTOMY TUBE REMOVAL  02/03/2018   IR PATIENT EVAL TECH 0-60 MINS  03/25/2017   IR REMOVAL TUN ACCESS W/ PORT W/O FL MOD SED  01/15/2017   IR REPLACE G-TUBE SIMPLE WO FLUORO  01/13/2018    LARYNGOSCOPY N/A 09/15/2013   Procedure: LARYNGOSCOPY;  Surgeon: [name], MD;  Location: [facility];  Service: ENT;  Laterality: N/A;  direct laryngoscopy with biopsy and esophagoscopy   RADIOACTIVE SEED IMPLANT N/A 09/23/2018   Procedure: RADIOACTIVE SEED IMPLANT/BRACHYTHERAPY IMPLANT;  Surgeon: [name], MD;  Location: [facility];  Service: Urology;  Laterality: N/A;   RIGHT/LEFT HEART CATH AND CORONARY ANGIOGRAPHY N/A 01/05/2017   Procedure: Right/Left Heart Cath and Coronary Angiography;  Surgeon: [name], MD;  Location: [facility];  Service: Cardiovascular;  Laterality: N/A;   TRANSTHORACIC ECHOCARDIOGRAM  05/06/2017   ef 45-50%,  grade 1 diastolic dysfunction/  AV severe calcified non coronary cusp with moderate regurg. , no stenosis (valve area per echo 01-05-2017 1.08cm^2)/  mild MR, TR, and PR/  mild LAE       Family History  Problem Relation Age of Onset   Breast cancer Mother    Prostate cancer Neg Hx    Colon cancer Neg Hx    Pancreatic cancer Neg Hx     Social History   Tobacco Use   Smoking status: Former    Packs/day: 1.00    Years: 40.00    Pack years: 40.00    Types: Cigarettes    Start date: 11/08/1960    Quit date: 08/31/2009    Years since quitting: 11.7   Smokeless tobacco: Former    Types: Chew    Quit date: 09/01/1999  Vaping Use   Vaping Use: Never used  Substance Use Topics   Alcohol use: Not Currently    Alcohol/week: 0.0 standard drinks    Comment: quit in 2001   Drug use: No    Comment: back in the day used cocaine,alcohol, marijuana    Home Medications Prior to Admission medications   Medication Sig Start Date End Date Taking? Authorizing Provider  albuterol (VENTOLIN HFA) 108 (90 Base) MCG/ACT inhaler INHALE 2 PUFFS INTO THE LUNGS EVERY 6 HOURS AS NEEDED FOR WHEEZING OR SHORTNESS OF BREATH Patient taking differently: Inhale 2 puffs into the lungs every 6 (six) hours as needed for wheezing or shortness of  breath. 03/28/21   Copland, Gay Filler, MD  aspirin EC 81 MG tablet Take 81 mg by mouth daily.    [provider]  dapagliflozin propanediol (FARXIGA) 10 MG TABS tablet Take 1 tablet (10 mg total) by mouth daily. 05/12/21   Georgette Shell, MD  ENTRESTO 24-26 MG TAKE 1 TABLET BY MOUTH TWICE DAILY Patient taking differently: Take 1 tablet by mouth daily. 11/11/20   Skeet Latch, MD  Evolocumab (REPATHA SURECLICK) 030 MG/ML SOAJ Inject 140 mg into the skin every 14 (fourteen) days. 11/14/20   Skeet Latch, MD  fentaNYL (DURAGESIC) 12 MCG/HR Place 1 patch onto the skin every 3 (three) days. 06/04/21   [name], MD  furosemide (LASIX) 40 MG tablet Take  40 mg of Lasix if you gain weight more than 2 pounds per day or more than 5 pounds per week 05/11/21   Georgette Shell, MD  metoprolol succinate (TOPROL-XL) 25 MG 24 hr tablet Take 0.5 tablets (12.5 mg total) by mouth daily. 05/12/21   Georgette Shell, MD  Multiple Vitamin (MULTIVITAMIN) tablet Take 1 tablet by mouth daily.    [provider]  polyethylene glycol (MIRALAX / GLYCOLAX) 17 g packet Take 17 g by mouth daily as needed for mild constipation. 05/11/21   Georgette Shell, MD  pravastatin (PRAVACHOL) 80 MG tablet Take 1 tablet (80 mg total) by mouth every evening. 04/30/21 07/29/21  Skeet Latch, MD    Allergies    Patient has no known allergies.  Review of Systems   Review of Systems  Respiratory:  Positive for shortness of breath.   All other systems reviewed and are negative.  Physical Exam Updated Vital Signs BP (!) 150/88   Pulse 99   Temp 98.6 F (37 C)   Resp 19   Ht 5\' 6"  (1.676 m)   Wt 62 kg   SpO2 100%   BMI 22.06 kg/m   Physical Exam Vitals and nursing note reviewed.  Constitutional:      Appearance: He is well-developed.  HENT:     Head: Normocephalic and atraumatic.  Cardiovascular:     Rate and Rhythm: Normal rate and regular rhythm.     Heart sounds: No  murmur heard. Pulmonary:     Effort: Pulmonary effort is normal. No respiratory distress.     Breath sounds: Normal breath sounds.     Comments: Hoarse voice.  No stridor Abdominal:     Palpations: Abdomen is soft.     Tenderness: There is no abdominal tenderness. There is no guarding or rebound.  Musculoskeletal:        General: No swelling or tenderness.  Skin:    General: Skin is warm and dry.  Neurological:     Mental Status: He is alert and oriented to person, place, and time.  Psychiatric:        Behavior: Behavior normal.    ED Results / Procedures / Treatments   Labs (all labs ordered are listed, but only abnormal results are displayed) Labs Reviewed  COMPREHENSIVE METABOLIC PANEL - Abnormal; Notable for the following components:      Result Value   Sodium 132 (*)    Chloride 97 (*)    Glucose, Bld 136 (*)    BUN 28 (*)    Creatinine, Ser 1.58 (*)    GFR, Estimated 47 (*)    All other components within normal limits  CBC WITH DIFFERENTIAL/PLATELET - Abnormal; Notable for the following components:   Lymphs Abs 0.6 (*)    All other components within normal limits  BRAIN NATRIURETIC PEPTIDE - Abnormal; Notable for the following components:   B Natriuretic Peptide >4,500.0 (*)    All other components within normal limits  TROPONIN I (HIGH SENSITIVITY) - Abnormal; Notable for the following components:   Troponin I (High Sensitivity) 37 (*)    All other components within normal limits  TROPONIN I (HIGH SENSITIVITY) - Abnormal; Notable for the following components:   Troponin I (High Sensitivity) 38 (*)    All other components within normal limits  RESP PANEL BY RT-PCR (FLU A&B, COVID) ARPGX2  PROTIME-INR  APTT  HEPARIN LEVEL (UNFRACTIONATED)    EKG EKG Interpretation  Date/Time:  Wednesday June 11 2021 01:08:42  EDT Ventricular Rate:  100 PR Interval:  215 QRS Duration: 112 QT Interval:  371 QTC Calculation: 479 R Axis:   14 Text Interpretation: Sinus  tachycardia Prolonged PR interval Probable left atrial enlargement LVH with secondary repolarization abnormality Borderline prolonged QT interval Confirmed by Quintella Reichert [phone]) on 06/11/2021 1:35:37 AM  Radiology DG Chest 2 View  Result Date: 06/11/2021 CLINICAL DATA:  Shortness of breath.  COPD. EXAM: CHEST - 2 VIEW COMPARISON:  06/02/2021 FINDINGS: Shallow inspiration. Heart size and pulmonary vascularity are normal for technique. Small bilateral pleural effusions, greater on the right. Infiltration or atelectasis in the right lung base, likely pneumonia. There is progression since the previous study. Calcified and tortuous aorta. No pneumothorax. IMPRESSION: Small bilateral pleural effusions, greater on the right. Infiltration or atelectasis in the right lung base likely represents pneumonia. Progression since prior study. Electronically Signed   By: Lucienne Capers M.D.   On: 06/11/2021 01:28   CT Angio Chest PE W/Cm &/Or Wo Cm  Result Date: 06/11/2021 CLINICAL DATA:  Shortness of breath.  Pulmonary embolism suspected EXAM: CT ANGIOGRAPHY CHEST WITH CONTRAST TECHNIQUE: Multidetector CT imaging of the chest was performed using the standard protocol during bolus administration of intravenous contrast. Multiplanar CT image reconstructions and MIPs were obtained to evaluate the vascular anatomy. CONTRAST:  69mL OMNIPAQUE IOHEXOL 350 MG/ML SOLN COMPARISON:  05/08/2021 FINDINGS: Cardiovascular: Enlarged heart. Motion degraded study but still positive for a tubular filling defect in lobar to segmental right lower lobe pulmonary arteries, nonocclusive. Bilateral posterior basal segment left lower lobe pulmonary arteries are nonenhancing and small, suspect remote emboli. No RV dilatation when compared to the left ventricle. No enhancement of the systemic arterial tree. Atheromatous calcification of the aorta and coronaries. Mediastinum/Nodes: Negative for adenopathy or mass. Lungs/Pleura: Large right  and small left pleural effusions. Interlobular septal thickening. No pneumothorax. No consolidation. Fibrotic scar-like appearance in the apical lungs. Upper Abdomen: Atheromatous calcification. Left hilar calcification is likely atheromatous based on prior CT. Musculoskeletal: No acute finding. Review of the MIP images confirms the above findings. Critical Value/emergent results were called by telephone at the time of interpretation on 06/11/2021 at 5:18 am to provider [facility] , who verbally acknowledged these results. IMPRESSION: 1. Positive for lobar to segmental pulmonary embolism in the right lower lobe. 2. Chronic poor enhancement of lower lobe segmental vessels which may be related to remote emboli or chronic shunting away from compressive atelectasis. 3. Large right and small left pleural effusion similar to CT 1 month ago. Mild interstitial edema. 4. Aortic Atherosclerosis (ICD10-I70.0).  Coronary atherosclerosis. Electronically Signed   By: Jorje Guild M.D.   On: 06/11/2021 05:20    Procedures Procedures  CRITICAL CARE Performed by: Quintella Reichert   Total critical care time: 35 minutes  Critical care time was exclusive of separately billable procedures and treating other patients.  Critical care was necessary to treat or prevent imminent or life-threatening deterioration.  Critical care was time spent personally by me on the following activities: development of treatment plan with patient and/or surrogate as well as nursing, discussions with consultants, evaluation of patient's response to treatment, examination of patient, obtaining history from patient or surrogate, ordering and performing treatments and interventions, ordering and review of laboratory studies, ordering and review of radiographic studies, pulse oximetry and re-evaluation of patient's condition.  Medications Ordered in ED Medications  sodium chloride (PF) 0.9 % injection (has no administration in time range)   heparin bolus via infusion 3,000 Units (3,000 Units  Intravenous Bolus from Bag 06/11/21 0602)    Followed by  heparin ADULT infusion 100 units/mL (25000 units/272mL) (1,100 Units/hr Intravenous New Bag/Given 06/11/21 0600)  cefTRIAXone (ROCEPHIN) 1 g in sodium chloride 0.9 % 100 mL IVPB (0 g Intravenous Stopped 06/11/21 0554)  doxycycline (VIBRA-TABS) tablet 100 mg (100 mg Oral Given 06/11/21 0525)  iohexol (OMNIPAQUE) 350 MG/ML injection 80 mL (80 mLs Intravenous Contrast Given 06/11/21 0439)    ED Course  I have reviewed the triage vital signs and the nursing notes.  Pertinent labs & imaging results that were available during my care of the patient were reviewed by me and considered in my medical decision making (see chart for details).    MDM Rules/Calculators/A&P                         patient with history of COPD, esophageal cancer, CHF here for evaluation of increase shortness of breath. He received multiple respiratory medications by EMS prior to ED arrival. On assessment in the emergency department he is feeling improved and lungs are clear bilaterally. Chest x-ray with right lower lobe infiltrate concerning for developing infection and was started on antibiotics. Given his recent hospitalization a CTA was obtained, which is significant for lobar/segmental PE. Discussed with patient and daughter findings of studies and recommendation for admission and they are in agreement treatment plan. He was started on heparin drip. He does have episodes of hypoxia to the mid 80s and he started on oxygen. Medicine consulted for admission for ongoing treatment.  Final Clinical Impression(s) / ED Diagnoses Final diagnoses:  None    Rx / DC Orders ED Discharge Orders     None        Quintella Reichert, MD 06/11/21 [phone]

## 2021-06-11 NOTE — Telephone Encounter (Signed)
Spoke with Linus Orn and advised request cardiology consult with hospital if they would like cardiology involved

## 2021-06-11 NOTE — Progress Notes (Signed)
Postthoracentesis chest x-ray reviewed  No pneumothorax 

## 2021-06-11 NOTE — Progress Notes (Signed)
ANTICOAGULATION CONSULT NOTE  Pharmacy Consult for Heparin Indication: pulmonary embolus  No Known Allergies  Patient Measurements: Height: 5\' 6"  (167.6 cm) Weight: 62 kg (136 lb 11 oz) IBW/kg (Calculated) : 63.8 Heparin Dosing Weight: actual body weight  Vital Signs: Temp: 97.8 F (36.6 C) (10/12 1557) BP: 139/83 (10/12 1557) Pulse Rate: 106 (10/12 1557)  Labs: Recent Labs    06/11/21 0225 06/11/21 0416 06/11/21 0536 06/11/21 1500 06/11/21 1837  HGB 13.0  --   --   --   --   HCT 41.0  --   --   --   --   PLT 207  --   --   --   --   APTT  --   --  25  --   --   LABPROT  --   --  15.0  --   --   INR  --   --  1.2  --   --   HEPARINUNFRC  --   --   --  <0.10* 0.46  CREATININE 1.58*  --   --   --   --   TROPONINIHS 37* 38*  --   --   --      Estimated Creatinine Clearance: 38.2 mL/min (A) (by C-G formula based on SCr of 1.58 mg/dL (H)).   Medications:  No oral anticoagulation PTA  Assessment: 70 yr male with shortness of breath CTAngio = + Pulmonary Embolism  Today, 06/11/2021: Initial heparin level undetectable on 1100 units/hr, but upon further questioning, ED RN recalled pt had silenced occlusion alarm not long before labs drawn Repeat heparin level subtherapeutic on 1100 units/hr, suggesting that infusion had truly been interrupted by occluded line No bleeding or current infusion issues per RN (notified RN to monitor routinely for silenced pump) CBC WNL SCr slightly above baseline of ~1.3  Goal of Therapy:  Heparin level 0.3-0.7 units/ml Monitor platelets by anticoagulation protocol: Yes   Plan:  Continue heparin at 1100 units/hr Recheck heparin level in AM Follow daily heparin level & CBC Monitor for s/s of bleeding or worsening thrombosis F/u long-term anticoagulation plans  Terrance Usery A, PharmD 06/11/2021,7:55 PM

## 2021-06-11 NOTE — Telephone Encounter (Signed)
Follow Up:      Patient's daughter is returning Melinda's call from today.

## 2021-06-11 NOTE — Progress Notes (Signed)
ANTICOAGULATION CONSULT NOTE - Initial Consult  Pharmacy Consult for Heparin Indication: pulmonary embolus  No Known Allergies  Patient Measurements: Height: 5\' 6"  (167.6 cm) Weight: 62 kg (136 lb 11 oz) IBW/kg (Calculated) : 63.8 Heparin Dosing Weight: actual body weight  Vital Signs: Temp: 98.6 F (37 C) (10/12 0148) BP: 169/92 (10/12 0330) Pulse Rate: 101 (10/12 0330)  Labs: Recent Labs    06/11/21 0225  HGB 13.0  HCT 41.0  PLT 207  CREATININE 1.58*  TROPONINIHS 37*    Estimated Creatinine Clearance: 38.2 mL/min (A) (by C-G formula based on SCr of 1.58 mg/dL (H)).   Medical History: Past Medical History:  Diagnosis Date   Chronic combined systolic and diastolic CHF (congestive heart failure) ([place])    followed by [name]   COPD (chronic obstructive pulmonary disease) ([place])    Coronary artery disease cardiologist-- [name]   per cardiac cath 01-05-2017  chronic total occlusion pLCx with collaterals, 99% severe calcified prox. to mid RCA, otherwise mild to moderate CAD (medically managed)   Esophageal cancer, stage IIIB [facility]) oncologist-  dr ennever/  dr moody   dx 2010  SCC Stage IIIB completed chemoradiation;  localized recurrent left piriform sinus 01/ 2015,  completed concurrent chemoradiatoin 04/ 2015   GERD (gastroesophageal reflux disease)    09-07-2018   no issues since Gtube removed 06/ 2019   Headache    History of alcohol abuse    quit 2001   History of cancer chemotherapy    2010;   2015   History of radiation therapy    10-19-2013 to  12-05-2013 pyriform sinus 69.96 Gy/18fx;   Radiation completed 2010 for esophageal cancer   History of seizure 2001   alcohol withdrawal   HOH (hard of hearing)    Hyperlipidemia    Hypertension    Ischemic cardiomyopathy 12/2016   01-03-2017  echo,  ef 10-15%/   echo 05-06-2017 EF improved to 45-50%   Lower urinary tract symptoms (LUTS)    Prostate cancer [facility]) urologist-  dr ottelin/   oncologist-- [name]   dx 05-27-2018--- Stage T1c,  Gleason 4+3,  PSA 9.3--  scheduled for brachytherapy 09-23-2017   Pure hypercholesterolemia 09/23/2020   Renal cyst, left    Voice hoarseness    secondary to radiation treatment    Medications:  No oral anticoagulation PTA  Assessment: 70 yr male with shortness of breath CTAngio = + Pulmonary Embolism  Goal of Therapy:  Heparin level 0.3-0.7 units/ml Monitor platelets by anticoagulation protocol: Yes   Plan:  Baseline aPTT and PT/INR Heparin 3000 unit IV bolus x 1 followed by heparin gtt @ 1100 units/hr Check heparin level 8 hr after heparin started Follow daily heparin level & CBC  Yaniv Lage, Toribio Harbour, PharmD 06/11/2021,5:26 AM

## 2021-06-11 NOTE — ED Triage Notes (Signed)
Pt to ED by EMS from home with SOB/ COPD exacerbation which began this morning. Pt has a strong hx of the same. Arrives on 8lpm duoneb. Pt has rhonchi & wheezing throughout. Pt Received by EMS: 125 solumedrol 2 grams mag/sulf 5mg  albuterol 0.5 atrovent Duoneb

## 2021-06-11 NOTE — Telephone Encounter (Signed)
Spoke with Ardyth Gal and advised she was not on DPR   Did explain to her if she wanted cardiology to insist on consultation No patient information given to Ardyth Gal however she did say pulmonologist had come in    Tried to call Linus Orn other daughter who is on DPR, left message to call back

## 2021-06-11 NOTE — Procedures (Addendum)
Thoracentesis  Procedure Note  Collin Young  [id number]  12/22/1950  Date:06/11/21  Time:2:04 PM   Provider Performing:Aimie Wagman D. Harris   Procedure: Thoracentesis with imaging guidance (13143)  Indication(s) Pleural Effusion  Consent Risks of the procedure as well as the alternatives and risks of each were explained to the patient and/or caregiver.  Consent for the procedure was obtained and is signed in the bedside chart  Anesthesia Topical only with 1% lidocaine    Time Out Verified patient identification, verified procedure, site/side was marked, verified correct patient position, special equipment/implants available, medications/allergies/relevant history reviewed, required imaging and test results available.   Sterile Technique Maximal sterile technique including full sterile barrier drape, hand hygiene, sterile gown, sterile gloves, mask, hair covering, sterile ultrasound probe cover (if used).  Procedure Description Ultrasound was used to identify appropriate pleural anatomy for placement and overlying skin marked.  Area of drainage cleaned and draped in sterile fashion. Lidocaine was used to anesthetize the skin and subcutaneous tissue.  1200 cc's of clear yellow appearing fluid was drained from the right pleural space. Catheter then removed and bandaid applied to site.   Complications/Tolerance None; patient tolerated the procedure well. Chest X-ray is ordered to confirm no post-procedural complication.   EBL Minimal   Specimen(s) Pleural fluid  Collin Young D. Kenton Kingfisher, NP-C [facility] Personal contact information can be found on Amion  06/11/2021, 2:04 PM

## 2021-06-11 NOTE — H&P (Addendum)
History and Physical    Collin Young WCB:[id number] DOB: [birth year] DOA: 06/11/2021  PCP: Darreld Mclean, MD  Patient coming from: Home  Chief Complaint: dyspnea  HPI: Collin Young is a 70 y.o. male with medical history significant of combined systolic/diastolic HF, esophageal CA, prostate CA, COPD, HLD, CAD, CKD3a. Presenting with dyspnea. His symptoms started yesterday morning. He felt short of breath and required extra dosing of his inhalers. He was able to complete his activities throughout the day. However by the time the evening came about, his inhalers were not giving him relief. He called his daughter who advised him to take an extra lasix and call her back. He took the lasix and around 10p he still was not improved. He tried to lay down, but found himself needing to be propped up. He called his daughter again around midnight, and she called EMS. He denies any other aggravating or alleviating factors.   ED Course: CTA showed RLL PE. He was started on heparin gtt. Initially he was started on rocephin for presumed PNA. BNP is found to be >4500. [facility] was called for admission.   Review of Systems:  Denies CP, abdominal pain, palpitations, lightheadedness, dizziness, N/V/D, fevers. Reports dyspnea, orthopnea. Review of systems is otherwise negative for all not mentioned in HPI.   PMHx Past Medical History:  Diagnosis Date   Chronic combined systolic and diastolic CHF (congestive heart failure) ([place])    followed by [name]   COPD (chronic obstructive pulmonary disease) ([place])    Coronary artery disease cardiologist-- [name]   per cardiac cath 01-05-2017  chronic total occlusion pLCx with collaterals, 99% severe calcified prox. to mid RCA, otherwise mild to moderate CAD (medically managed)   Esophageal cancer, stage IIIB [facility]) oncologist-  dr ennever/  dr moody   dx 2010  SCC Stage IIIB completed chemoradiation;  localized recurrent left piriform sinus 01/  2015,  completed concurrent chemoradiatoin 04/ 2015   GERD (gastroesophageal reflux disease)    09-07-2018   no issues since Gtube removed 06/ 2019   Headache    History of alcohol abuse    quit 2001   History of cancer chemotherapy    2010;   2015   History of radiation therapy    10-19-2013 to  12-05-2013 pyriform sinus 69.96 Gy/60fx;   Radiation completed 2010 for esophageal cancer   History of seizure 2001   alcohol withdrawal   HOH (hard of hearing)    Hyperlipidemia    Hypertension    Ischemic cardiomyopathy 12/2016   01-03-2017  echo,  ef 10-15%/   echo 05-06-2017 EF improved to 45-50%   Lower urinary tract symptoms (LUTS)    Prostate cancer [facility]) urologist-  dr ottelin/  oncologist-- [name]   dx 05-27-2018--- Stage T1c,  Gleason 4+3,  PSA 9.3--  scheduled for brachytherapy 09-23-2017   Pure hypercholesterolemia 09/23/2020   Renal cyst, left    Voice hoarseness    secondary to radiation treatment    PSHx Past Surgical History:  Procedure Laterality Date   CARDIOVASCULAR STRESS TEST  10/09/09   normal nuclearr stress test, EF 57% ([place])   IR GASTROSTOMY TUBE MOD SED  01/13/2017   IR GASTROSTOMY TUBE REMOVAL  02/03/2018   IR PATIENT EVAL TECH 0-60 MINS  03/25/2017   IR REMOVAL TUN ACCESS W/ PORT W/O FL MOD SED  01/15/2017   IR REPLACE G-TUBE SIMPLE WO FLUORO  01/13/2018   LARYNGOSCOPY N/A 09/15/2013  Procedure: LARYNGOSCOPY;  Surgeon: [name], MD;  Location: [facility] OR;  Service: ENT;  Laterality: N/A;  direct laryngoscopy with biopsy and esophagoscopy   RADIOACTIVE SEED IMPLANT N/A 09/23/2018   Procedure: RADIOACTIVE SEED IMPLANT/BRACHYTHERAPY IMPLANT;  Surgeon: [name], MD;  Location: [place];  Service: Urology;  Laterality: N/A;   RIGHT/LEFT HEART CATH AND CORONARY ANGIOGRAPHY N/A 01/05/2017   Procedure: Right/Left Heart Cath and Coronary Angiography;  Surgeon: [name], MD;  Location: [place] CV LAB;  Service: Cardiovascular;   Laterality: N/A;   TRANSTHORACIC ECHOCARDIOGRAM  05/06/2017   ef 45-50%,  grade 1 diastolic dysfunction/  AV severe calcified non coronary cusp with moderate regurg. , no stenosis (valve area per echo 01-05-2017 1.08cm^2)/  mild MR, TR, and PR/  mild LAE    SocHx  reports that he quit smoking about 11 years ago. His smoking use included cigarettes. He started smoking about 60 years ago. He has a 40.00 pack-year smoking history. He quit smokeless tobacco use about 21 years ago.  His smokeless tobacco use included chew. He reports that he does not currently use alcohol. He reports that he does not use drugs.  No Known Allergies  FamHx Family History  Problem Relation Age of Onset   Breast cancer Mother    Prostate cancer Neg Hx    Colon cancer Neg Hx    Pancreatic cancer Neg Hx     Prior to Admission medications   Medication Sig Start Date End Date Taking? Authorizing Provider  albuterol (VENTOLIN HFA) 108 (90 Base) MCG/ACT inhaler INHALE 2 PUFFS INTO THE LUNGS EVERY 6 HOURS AS NEEDED FOR WHEEZING OR SHORTNESS OF BREATH Patient taking differently: Inhale 2 puffs into the lungs every 6 (six) hours as needed for wheezing or shortness of breath. 03/28/21   Copland, Gay Filler, MD  aspirin EC 81 MG tablet Take 81 mg by mouth daily.    [provider]  dapagliflozin propanediol (FARXIGA) 10 MG TABS tablet Take 1 tablet (10 mg total) by mouth daily. 05/12/21   Georgette Shell, MD  ENTRESTO 24-26 MG TAKE 1 TABLET BY MOUTH TWICE DAILY Patient taking differently: Take 1 tablet by mouth daily. 11/11/20   Skeet Latch, MD  Evolocumab (REPATHA SURECLICK) 644 MG/ML SOAJ Inject 140 mg into the skin every 14 (fourteen) days. 11/14/20   Skeet Latch, MD  fentaNYL (DURAGESIC) 12 MCG/HR Place 1 patch onto the skin every 3 (three) days. 06/04/21   [name], MD  furosemide (LASIX) 40 MG tablet Take 40 mg of Lasix if you gain weight more than 2 pounds per day or more than 5 pounds per  week 05/11/21   Georgette Shell, MD  metoprolol succinate (TOPROL-XL) 25 MG 24 hr tablet Take 0.5 tablets (12.5 mg total) by mouth daily. 05/12/21   Georgette Shell, MD  Multiple Vitamin (MULTIVITAMIN) tablet Take 1 tablet by mouth daily.    [provider]  polyethylene glycol (MIRALAX / GLYCOLAX) 17 g packet Take 17 g by mouth daily as needed for mild constipation. 05/11/21   Georgette Shell, MD  pravastatin (PRAVACHOL) 80 MG tablet Take 1 tablet (80 mg total) by mouth every evening. 04/30/21 07/29/21  Skeet Latch, MD    Physical Exam: Vitals:   06/11/21 0148 06/11/21 0215 06/11/21 0330 06/11/21 0530  BP: (!) 161/99 (!) 151/93 (!) 169/92 (!) 150/88  Pulse: 99 (!) 101 (!) 101 99  Resp: 18   19  Temp: 98.6 F (37  C)     SpO2: 96% 92% 94% 100%  Weight:      Height:        General: 70 y.o. male resting in bed in NAD Eyes: PERRL, normal sclera ENMT: Nares patent w/o discharge, orophaynx clear, dentition normal, ears w/o discharge/lesions/ulcers Neck: Supple, trachea midline Cardiovascular: RRR, +S1, S2, no m/g/r, equal pulses throughout Respiratory: CTABL, no w/r/r, normal WOB GI: BS+, NDNT, no masses noted, no organomegaly noted MSK: No e/c/c Skin: No rashes, bruises, ulcerations noted Neuro: A&O x 3, no focal deficits Psyc: Appropriate interaction and affect, calm/cooperative  Labs on Admission: I have personally reviewed following labs and imaging studies  CBC: Recent Labs  Lab 06/11/21 0225  WBC 4.2  NEUTROABS 3.2  HGB 13.0  HCT 41.0  MCV 85.2  PLT 025   Basic Metabolic Panel: Recent Labs  Lab 06/11/21 0225  NA 132*  K 3.8  CL 97*  CO2 24  GLUCOSE 136*  BUN 28*  CREATININE 1.58*  CALCIUM 9.1   GFR: Estimated Creatinine Clearance: 38.2 mL/min (A) (by C-G formula based on SCr of 1.58 mg/dL (H)). Liver Function Tests: Recent Labs  Lab 06/11/21 0225  AST 31  ALT 18  ALKPHOS 38  BILITOT 0.7  PROT 7.4  ALBUMIN 4.0   No  results for input(s): LIPASE, AMYLASE in the last 168 hours. No results for input(s): AMMONIA in the last 168 hours. Coagulation Profile: Recent Labs  Lab 06/11/21 0536  INR 1.2   Cardiac Enzymes: No results for input(s): CKTOTAL, CKMB, CKMBINDEX, TROPONINI in the last 168 hours. BNP (last 3 results) Recent Labs    05/14/21 0935  PROBNP 4,378.0*   HbA1C: No results for input(s): HGBA1C in the last 72 hours. CBG: No results for input(s): GLUCAP in the last 168 hours. Lipid Profile: No results for input(s): CHOL, HDL, LDLCALC, TRIG, CHOLHDL, LDLDIRECT in the last 72 hours. Thyroid Function Tests: No results for input(s): TSH, T4TOTAL, FREET4, T3FREE, THYROIDAB in the last 72 hours. Anemia Panel: No results for input(s): VITAMINB12, FOLATE, FERRITIN, TIBC, IRON, RETICCTPCT in the last 72 hours. Urine analysis:    Component Value Date/Time   COLORURINE YELLOW 05/07/2021 2345   APPEARANCEUR HAZY (A) 05/07/2021 2345   APPEARANCEUR Clear 05/29/2020 1556   LABSPEC 1.023 05/07/2021 2345   PHURINE 5.0 05/07/2021 2345   GLUCOSEU NEGATIVE 05/07/2021 2345   HGBUR NEGATIVE 05/07/2021 2345   BILIRUBINUR NEGATIVE 05/07/2021 2345   BILIRUBINUR Negative 05/29/2020 [place] 05/07/2021 2345   PROTEINUR 100 (A) 05/07/2021 2345   UROBILINOGEN 1.0 08/04/2014 2007   NITRITE NEGATIVE 05/07/2021 2345   LEUKOCYTESUR TRACE (A) 05/07/2021 2345    Radiological Exams on Admission: DG Chest 2 View  Result Date: 06/11/2021 CLINICAL DATA:  Shortness of breath.  COPD. EXAM: CHEST - 2 VIEW COMPARISON:  06/02/2021 FINDINGS: Shallow inspiration. Heart size and pulmonary vascularity are normal for technique. Small bilateral pleural effusions, greater on the right. Infiltration or atelectasis in the right lung base, likely pneumonia. There is progression since the previous study. Calcified and tortuous aorta. No pneumothorax. IMPRESSION: Small bilateral pleural effusions, greater on the  right. Infiltration or atelectasis in the right lung base likely represents pneumonia. Progression since prior study. Electronically Signed   By: Lucienne Capers M.D.   On: 06/11/2021 01:28   CT Angio Chest PE W/Cm &/Or Wo Cm  Result Date: 06/11/2021 CLINICAL DATA:  Shortness of breath.  Pulmonary embolism suspected EXAM: CT ANGIOGRAPHY CHEST WITH CONTRAST TECHNIQUE:  Multidetector CT imaging of the chest was performed using the standard protocol during bolus administration of intravenous contrast. Multiplanar CT image reconstructions and MIPs were obtained to evaluate the vascular anatomy. CONTRAST:  33mL OMNIPAQUE IOHEXOL 350 MG/ML SOLN COMPARISON:  05/08/2021 FINDINGS: Cardiovascular: Enlarged heart. Motion degraded study but still positive for a tubular filling defect in lobar to segmental right lower lobe pulmonary arteries, nonocclusive. Bilateral posterior basal segment left lower lobe pulmonary arteries are nonenhancing and small, suspect remote emboli. No RV dilatation when compared to the left ventricle. No enhancement of the systemic arterial tree. Atheromatous calcification of the aorta and coronaries. Mediastinum/Nodes: Negative for adenopathy or mass. Lungs/Pleura: Large right and small left pleural effusions. Interlobular septal thickening. No pneumothorax. No consolidation. Fibrotic scar-like appearance in the apical lungs. Upper Abdomen: Atheromatous calcification. Left hilar calcification is likely atheromatous based on prior CT. Musculoskeletal: No acute finding. Review of the MIP images confirms the above findings. Critical Value/emergent results were called by telephone at the time of interpretation on 06/11/2021 at 5:18 am to provider [facility] , who verbally acknowledged these results. IMPRESSION: 1. Positive for lobar to segmental pulmonary embolism in the right lower lobe. 2. Chronic poor enhancement of lower lobe segmental vessels which may be related to remote emboli or chronic  shunting away from compressive atelectasis. 3. Large right and small left pleural effusion similar to CT 1 month ago. Mild interstitial edema. 4. Aortic Atherosclerosis (ICD10-I70.0).  Coronary atherosclerosis. Electronically Signed   By: Jorje Guild M.D.   On: 06/11/2021 05:20    EKG: Independently reviewed. Sinus tachy; no st elevations  Assessment/Plan Acute pulmonary embolism Dyspnea     - admit to inpt, tele     - continue heparin gtt; presenting [place] options, family/pt still deciding     - check echo     - O2 as needed     - was started on rocephin d/t suspicious CXR, but CTA doesn't show PNA; hold abx for now  HLD     - continue statin  CKD3a     - watch nephrotoxin  Acute on chronic combined systolic/diastolic HF Elevated troponins     - check echo     - BNP is up to >4500; up from ~2300 on 06/02/21; could be related to clot; but no right heart strain noted on CTA     - c/o orthopnea     - no peripheral edema     - if related to clot, would want to avoid volume depletion; follow up echo, I have asked PCCM to weigh in      Update: echo w/ global hypokinesis in similar fashion to last stay; no discrete R heart strain; will add lasix; spoke with cards, they will se in AM  Chronic bilateral pleural effusions     - imaging is stable from previous CT; needs pulm follow up (last seen by Dr. Shearon Stalls in 11/21)  Update: now s/p thoracentesis; appreciate pulm assistance  Hx of eosphageal CA Hx of prostate CA     - continue home regimen  DVT prophylaxis: heparin gtt  Code Status: FULL  Family Communication: w/ dtr at bedside  Consults called: PCCM, cards  Status is: Inpatient  Remains inpatient appropriate because:Inpatient level of care appropriate due to severity of illness  Dispo: The patient is from: Home              Anticipated d/c is to: Home  Patient currently is not medically stable to d/c.   Difficult to place patient No  Jonnie Finner DO [facility]  If 7PM-7AM, please contact night-coverage [email or web address]  06/11/2021, 7:10 AM

## 2021-06-11 NOTE — Telephone Encounter (Signed)
Pt. Is currently being admitted to hospital. Daughter would like a call to discuss reasoning's for admission.   [name]: [phone]

## 2021-06-11 NOTE — Telephone Encounter (Signed)
[name] is calling stating [name] is currently admitted in [place]. She reports they have advised he has a blood clot on his right lung and his heart is getting worse. She is wanting to know if Collin Young should be transported to [facility] so he can be closely monitored by our Cardiologist that round. She states he continues to be admitted to [facility] and they give him something then sending him home, for him to be readmitted again a few days later. Please advise.

## 2021-06-12 ENCOUNTER — Encounter (HOSPITAL_COMMUNITY): Payer: Self-pay | Admitting: Family Medicine

## 2021-06-12 ENCOUNTER — Other Ambulatory Visit (HOSPITAL_COMMUNITY): Payer: Self-pay | Admitting: *Deleted

## 2021-06-12 DIAGNOSIS — Z01812 Encounter for preprocedural laboratory examination: Secondary | ICD-10-CM

## 2021-06-12 DIAGNOSIS — I5023 Acute on chronic systolic (congestive) heart failure: Secondary | ICD-10-CM

## 2021-06-12 DIAGNOSIS — I2699 Other pulmonary embolism without acute cor pulmonale: Secondary | ICD-10-CM | POA: Diagnosis not present

## 2021-06-12 DIAGNOSIS — I2609 Other pulmonary embolism with acute cor pulmonale: Secondary | ICD-10-CM

## 2021-06-12 DIAGNOSIS — N1831 Chronic kidney disease, stage 3a: Secondary | ICD-10-CM | POA: Diagnosis not present

## 2021-06-12 DIAGNOSIS — J90 Pleural effusion, not elsewhere classified: Secondary | ICD-10-CM | POA: Diagnosis not present

## 2021-06-12 LAB — COMPREHENSIVE METABOLIC PANEL
ALT: 15 U/L (ref 0–44)
AST: 22 U/L (ref 15–41)
Albumin: 3.5 g/dL (ref 3.5–5.0)
Alkaline Phosphatase: 34 U/L — ABNORMAL LOW (ref 38–126)
Anion gap: 11 (ref 5–15)
BUN: 32 mg/dL — ABNORMAL HIGH (ref 8–23)
CO2: 24 mmol/L (ref 22–32)
Calcium: 9.2 mg/dL (ref 8.9–10.3)
Chloride: 101 mmol/L (ref 98–111)
Creatinine, Ser: 1.74 mg/dL — ABNORMAL HIGH (ref 0.61–1.24)
GFR, Estimated: 42 mL/min — ABNORMAL LOW (ref >60)
Glucose, Bld: 113 mg/dL — ABNORMAL HIGH (ref 70–99)
Potassium: 4.2 mmol/L (ref 3.5–5.1)
Sodium: 136 mmol/L (ref 135–145)
Total Bilirubin: 0.6 mg/dL (ref 0.3–1.2)
Total Protein: 6.7 g/dL (ref 6.5–8.1)

## 2021-06-12 LAB — CBC
HCT: 39 % (ref 39.0–52.0)
Hemoglobin: 13.1 g/dL (ref 13.0–17.0)
MCH: 28 pg (ref 26.0–34.0)
MCHC: 33.6 g/dL (ref 30.0–36.0)
MCV: 83.3 fL (ref 80.0–100.0)
Platelets: 217 10*3/uL (ref 150–400)
RBC: 4.68 MIL/uL (ref 4.22–5.81)
RDW: 14.9 % (ref 11.5–15.5)
WBC: 8.8 10*3/uL (ref 4.0–10.5)
nRBC: 0 % (ref 0.0–0.2)

## 2021-06-12 LAB — HEPARIN LEVEL (UNFRACTIONATED): Heparin Unfractionated: 0.57 IU/mL (ref 0.30–0.70)

## 2021-06-12 MED ORDER — POLYETHYLENE GLYCOL 3350 17 G PO PACK
17.0000 g | PACK | Freq: Every day | ORAL | Status: DC
  Administered 2021-06-12 – 2021-06-19 (×5): 17 g via ORAL
  Filled 2021-06-12 (×5): qty 1

## 2021-06-12 MED ORDER — APIXABAN 5 MG PO TABS: 5.0000 mg | ORAL_TABLET | Freq: Two times a day (BID) | ORAL | Status: DC

## 2021-06-12 MED ORDER — APIXABAN 5 MG PO TABS
10.0000 mg | ORAL_TABLET | Freq: Two times a day (BID) | ORAL | Status: DC
Start: 2021-06-12 — End: 2021-06-15
  Administered 2021-06-12 – 2021-06-15 (×7): 10 mg via ORAL
  Filled 2021-06-12 (×7): qty 2
  Filled 2021-06-12: qty 4

## 2021-06-12 NOTE — Progress Notes (Signed)
NAME:  Collin Young, MRN:  [id number], DOB:  1951/03/17, LOS: 1 ADMISSION DATE:  06/11/2021, CONSULTATION DATE:  06/12/21 REFERRING MD:  Dr Marylyn Ishihara, CHIEF COMPLAINT: Pleural effusion  History of Present Illness:  Collin Young is a 70 y.o. male with a PMH significant for systolic and diastolic CHF, COPD, prior smoker, CAD, esophageal and prostate cancer s/p chemo and radiation, GERD, prior ETOH abuse, seizures, HTN, and HLD who presented to the ED  for complaints of shortness of breath.    On ED admit patient was treated for acute COPD exacerbation with BDs and supplemental oxygen. CTA chest was obtained that revealed lobar/subsegmental PE's on right with large right and small left pleural effusions. Given PE and pleural effusion PCCM consult for pulmonary eval.    Of note patient was recently admitted at this facility 10/3 - 10/4 for similar presented and was treated for acute CHF exacerbation.  Pertinent  Medical History   Past Medical History:  Diagnosis Date   Chronic combined systolic and diastolic CHF (congestive heart failure) ([place])    followed by [name]   COPD (chronic obstructive pulmonary disease) ([place])    Coronary artery disease cardiologist-- [name]   per cardiac cath 01-05-2017  chronic total occlusion pLCx with collaterals, 99% severe calcified prox. to mid RCA, otherwise mild to moderate CAD (medically managed)   Esophageal cancer, stage IIIB [facility]) oncologist-  dr ennever/  dr moody   dx 2010  SCC Stage IIIB completed chemoradiation;  localized recurrent left piriform sinus 01/ 2015,  completed concurrent chemoradiatoin 04/ 2015   GERD (gastroesophageal reflux disease)    09-07-2018   no issues since Gtube removed 06/ 2019   Headache    History of alcohol abuse    quit 2001   History of cancer chemotherapy    2010;   2015   History of radiation therapy    10-19-2013 to  12-05-2013 pyriform sinus 69.96 Gy/7fx;   Radiation completed 2010 for  esophageal cancer   History of seizure 2001   alcohol withdrawal   HOH (hard of hearing)    Hyperlipidemia    Hypertension    Ischemic cardiomyopathy 12/2016   01-03-2017  echo,  ef 10-15%/   echo 05-06-2017 EF improved to 45-50%   Lower urinary tract symptoms (LUTS)    Prostate cancer [facility]) urologist-  dr ottelin/  oncologist-- [name]   dx 05-27-2018--- Stage T1c,  Gleason 4+3,  PSA 9.3--  scheduled for brachytherapy 09-23-2017   Pure hypercholesterolemia 09/23/2020   Renal cyst, left    Voice hoarseness    secondary to radiation treatment     Significant Hospital Events: Including procedures, antibiotic start and stop dates in addition to other pertinent events   Admitted with shortness of breath Postthoracentesis for about 1200cc of serous fluid  Interim History / Subjective:  No significant complaints today More comfortable Dyspnea improved  Objective   Blood pressure (!) 126/94, pulse (!) 106, temperature 98 F (36.7 C), temperature source Oral, resp. rate 20, height 5\' 6"  (1.676 m), weight 52.7 kg, SpO2 91 %.        Intake/Output Summary (Last 24 hours) at 06/12/2021 0901 Last data filed at 06/12/2021 4403 Gross per 24 hour  Intake 134.25 ml  Output 1200 ml  Net -1065.75 ml   Filed Weights   06/11/21 0109 06/12/21 0439  Weight: 62 kg 52.7 kg    Examination: General: Elderly, frail HENT: Moist oral mucosa Lungs: Decreased air  entry at the bases bilaterally Cardiovascular: S1-S2 appreciated Abdomen: Soft, bowel sounds appreciated Extremities: No clubbing, no edema Neuro: Alert and oriented x3 GU: [facility] Hospital Problem list     Assessment & Plan:  Postthoracentesis for 1200 cc of serous fluid -Fluid is transudative, lymphocyte predominant -Cytology pending -Fluid likely related to heart failure  Systolic heart failure  Right subsegmental PE -DOAC will be appropriate  History of COPD -Bronchodilators  Mobilize as  able   Will follow cytology  Sherrilyn Rist, MD [place] PCCM Pager: See Shea Evans

## 2021-06-12 NOTE — Telephone Encounter (Signed)
Hi Dr. Oval Linsey,   I am covering for Trish today. Yes ma'am, he is on our list to see.  Thank you! Aidon Klemens

## 2021-06-12 NOTE — Progress Notes (Addendum)
ANTICOAGULATION CONSULT NOTE  Pharmacy Consult for Heparin Indication: pulmonary embolus  No Known Allergies  Patient Measurements: Height: 5\' 6"  (167.6 cm) Weight: 52.7 kg (116 lb 2.9 oz) IBW/kg (Calculated) : 63.8 Heparin Dosing Weight: actual body weight  Vital Signs: Temp: 98 F (36.7 C) (10/13 0439) Temp Source: Oral (10/13 0439) BP: 126/94 (10/13 0439) Pulse Rate: 106 (10/13 0439)  Labs: Recent Labs    06/11/21 0225 06/11/21 0416 06/11/21 0536 06/11/21 1500 06/11/21 1837 06/12/21 0327  HGB 13.0  --   --   --   --  13.1  HCT 41.0  --   --   --   --  39.0  PLT 207  --   --   --   --  217  APTT  --   --  25  --   --   --   LABPROT  --   --  15.0  --   --   --   INR  --   --  1.2  --   --   --   HEPARINUNFRC  --   --   --  <0.10* 0.46 0.57  CREATININE 1.58*  --   --   --   --  1.74*  TROPONINIHS 37* 38*  --   --   --   --      Estimated Creatinine Clearance: 29.4 mL/min (A) (by C-G formula based on SCr of 1.74 mg/dL (H)).   Medications:  No oral anticoagulation PTA  Assessment: 70 yr male with shortness of breath CTAngio = + Pulmonary Embolism  Today, 06/12/2021: Heparin level therapeutic at 0.57 on heparin 1100 units/hr Confirmed with RN that line has not been interrupted or stopped CBC wnl and stable  Goal of Therapy:  Heparin level 0.3-0.7 units/ml Monitor platelets by anticoagulation protocol: Yes   Plan:  Continue heparin at 1100 units/hr Follow daily heparin level & CBC Monitor for s/s of bleeding or worsening thrombosis F/u long-term anticoagulation plans  Dimple Nanas, PharmD 06/12/2021,7:09 AM  AM UPDATE: -pharmacy consulted to dose Eliquis for PE -Begin Eliquis 10mg  PO BID x 7 days, followed by 5mg  PO BID thereafter -Discontinue heparin drip at time of Eliquis administration -Discussed with RN  Dimple Nanas, PharmD 06/12/2021 10:46 AM

## 2021-06-12 NOTE — Discharge Instructions (Addendum)
Information on my medicine - ELIQUIS (apixaban)  This medication education was reviewed with me or my healthcare representative as part of my discharge preparation.   Why was Eliquis prescribed for you? Eliquis was prescribed to treat blood clots that may have been found in the veins of your legs (deep vein thrombosis) or in your lungs (pulmonary embolism) and to reduce the risk of them occurring again.  What do You need to know about Eliquis ? The starting dose is 10 mg (two 5 mg tablets) taken TWICE daily for the FIRST SEVEN (7) DAYS, then on 06/19/21  the dose is reduced to ONE 5 mg tablet taken TWICE daily.  Eliquis may be taken with or without food.   Try to take the dose about the same time in the morning and in the evening. If you have difficulty swallowing the tablet whole please discuss with your pharmacist how to take the medication safely.  Take Eliquis exactly as prescribed and DO NOT stop taking Eliquis without talking to the doctor who prescribed the medication.  Stopping may increase your risk of developing a new blood clot.  Refill your prescription before you run out.  After discharge, you should have regular check-up appointments with your healthcare provider that is prescribing your Eliquis.    What do you do if you miss a dose? If a dose of ELIQUIS is not taken at the scheduled time, take it as soon as possible on the same day and twice-daily administration should be resumed. The dose should not be doubled to make up for a missed dose.  Important Safety Information A possible side effect of Eliquis is bleeding. You should call your healthcare provider right away if you experience any of the following: Bleeding from an injury or your nose that does not stop. Unusual colored urine (red or dark brown) or unusual colored stools (red or black). Unusual bruising for unknown reasons. A serious fall or if you hit your head (even if there is no bleeding).  Some  medicines may interact with Eliquis and might increase your risk of bleeding or clotting while on Eliquis. To help avoid this, consult your healthcare provider or pharmacist prior to using any new prescription or non-prescription medications, including herbals, vitamins, non-steroidal anti-inflammatory drugs (NSAIDs) and supplements.  This website has more information on Eliquis (apixaban): http://www.eliquis.com/eliquis/home

## 2021-06-12 NOTE — Plan of Care (Signed)
  Problem: Education: Goal: Knowledge of General Education information will improve Description: Including pain rating scale, medication(s)/side effects and non-pharmacologic comfort measures Outcome: Progressing   Problem: Health Behavior/Discharge Planning: Goal: Ability to manage health-related needs will improve Outcome: Progressing   Problem: Clinical Measurements: Goal: Ability to maintain clinical measurements within normal limits will improve Outcome: Progressing Goal: Will remain free from infection Outcome: Progressing Goal: Cardiovascular complication will be avoided Outcome: Progressing   Problem: Clinical Measurements: Goal: Will remain free from infection Outcome: Progressing   Problem: Clinical Measurements: Goal: Cardiovascular complication will be avoided Outcome: Progressing   Problem: Nutrition: Goal: Adequate nutrition will be maintained Outcome: Progressing   Problem: Coping: Goal: Level of anxiety will decrease Outcome: Progressing   Problem: Pain Managment: Goal: General experience of comfort will improve Outcome: Progressing   Problem: Safety: Goal: Ability to remain free from injury will improve Outcome: Progressing   Problem: Skin Integrity: Goal: Risk for impaired skin integrity will decrease Outcome: Progressing

## 2021-06-12 NOTE — Progress Notes (Signed)
PROGRESS NOTE  Collin Young OAC:[id number] DOB: Nov 16, 1950 DOA: 06/11/2021 PCP: Darreld Mclean, MD   LOS: 1 day   Brief Narrative / Interim history: 70 year old male with history of combined systolic diastolic CHF, esophageal cancer, prostate cancer, COPD, HLD, CAD, chronic kidney disease stage IIIa comes to the hospital with dyspnea.  Symptoms started the day prior to admission, not relieved by his home inhalers and eventually ended up requiring to come to the hospital.  In the ED CT angiogram showed pulmonary embolism lobar to segmental in the right lower lobe as well as large right and small left pleural effusion along with mild interstitial edema.  Pulmonary was consulted and he is status post thoracentesis on 10/12  Subjective / 24h Interval events: Feeling better this morning, denies any shortness of breath, no chest pain  Assessment & Plan: Principal Problem Acute pulmonary embolism-patient was maintained on heparin infusion since yesterday, no bleeding and seems to be tolerating well.  I discussed with him the risks/benefits for oral anticoagulation, patient prefers oral anticoagulant that can be crushed, transition to Eliquis today  Active Problems Acute on chronic combined systolic/diastolic CHF-patient had some evidence of fluid overload on admission with large pleural effusion.  Discussed with pulmonary, it appears to be transudate related to his heart failure. -He was given IV Lasix, received a dose this morning but hold this evening dose due to elevation in his creatinine  Acute kidney injury on chronic kidney disease stage IIIa-Baseline creatinine 1.2-1.4, currently elevated to 1.74, hold Lasix this evening  HLD - continue statin   Chronic bilateral pleural effusions - s/p right-sided thoracentesis 10/12   Hx of eosphageal CA Hx of prostate CA -outpatient management  Scheduled Meds:  guaiFENesin  600 mg Oral BID   Continuous Infusions:  heparin 1,100 Units/hr  (06/12/21 0305)   PRN Meds:.acetaminophen **OR** acetaminophen, levalbuterol, phenol  Diet Orders (From admission, onward)     Start     Ordered   06/11/21 1505  Diet Heart Room service appropriate? Yes; Fluid consistency: Thin; Fluid restriction: 1500 mL Fluid  Diet effective now       Question Answer Comment  Room service appropriate? Yes   Fluid consistency: Thin   Fluid restriction: 1500 mL Fluid      06/11/21 1504            DVT prophylaxis:      Code Status: Full Code  Family Communication: no family at bedside   Status is: Inpatient  Remains inpatient appropriate because:Inpatient level of care appropriate due to severity of illness  Dispo: The patient is from: Home              Anticipated d/c is to: Home              Patient currently is not medically stable to d/c.   Difficult to place patient No   Level of care: Telemetry  Consultants:  Pulmonary Cardiology  Procedures:  2D echo  Microbiology  none  Antimicrobials: none    Objective: Vitals:   06/11/21 1500 06/11/21 1557 06/11/21 2008 06/12/21 0439  BP: 135/88 139/83 (!) 151/93 (!) 126/94  Pulse: (!) 105 (!) 106 (!) 109 (!) 106  Resp: 20 20 20 20   Temp:  97.8 F (36.6 C) 98 F (36.7 C) 98 F (36.7 C)  TempSrc:   Oral Oral  SpO2: 97% 100% 100% 91%  Weight:    52.7 kg  Height:        Intake/Output Summary (  Last 24 hours) at 06/12/2021 1040 Last data filed at 06/12/2021 1013 Gross per 24 hour  Intake 134.25 ml  Output 1500 ml  Net -1365.75 ml   Filed Weights   06/11/21 0109 06/12/21 0439  Weight: 62 kg 52.7 kg    Examination:  Constitutional: NAD Eyes: no scleral icterus ENMT: Mucous membranes are moist.  Neck: normal, supple Respiratory: clear to auscultation bilaterally, no wheezing, no crackles. Normal respiratory effort. No accessory muscle use.  Cardiovascular: Regular rate and rhythm, no murmurs / rubs / gallops. No LE edema. Good peripheral pulses Abdomen: non  distended, no tenderness. Bowel sounds positive.  Musculoskeletal: no clubbing / cyanosis.  Skin: no rashes Neurologic: CN 2-12 grossly intact. Strength 5/5 in all 4.    Data Reviewed: I have independently reviewed following labs and imaging studies  CBC: Recent Labs  Lab 06/11/21 0225 06/12/21 0327  WBC 4.2 8.8  NEUTROABS 3.2  --   HGB 13.0 13.1  HCT 41.0 39.0  MCV 85.2 83.3  PLT 207 242   Basic Metabolic Panel: Recent Labs  Lab 06/11/21 0225 06/12/21 0327  NA 132* 136  K 3.8 4.2  CL 97* 101  CO2 24 24  GLUCOSE 136* 113*  BUN 28* 32*  CREATININE 1.58* 1.74*  CALCIUM 9.1 9.2   Liver Function Tests: Recent Labs  Lab 06/11/21 0225 06/12/21 0327  AST 31 22  ALT 18 15  ALKPHOS 38 34*  BILITOT 0.7 0.6  PROT 7.4 6.7  ALBUMIN 4.0 3.5   Coagulation Profile: Recent Labs  Lab 06/11/21 0536  INR 1.2   HbA1C: No results for input(s): HGBA1C in the last 72 hours. CBG: No results for input(s): GLUCAP in the last 168 hours.  Recent Results (from the past 240 hour(s))  Resp Panel by RT-PCR (Flu A&B, Covid) Nasopharyngeal Swab     Status: None   Collection Time: 06/11/21  5:25 AM   Specimen: Nasopharyngeal Swab; Nasopharyngeal(NP) swabs in vial transport medium  Result Value Ref Range Status   SARS Coronavirus 2 by RT PCR NEGATIVE NEGATIVE Final    Comment: (NOTE) SARS-CoV-2 target nucleic acids are NOT DETECTED.  The SARS-CoV-2 RNA is generally detectable in upper respiratory specimens during the acute phase of infection. The lowest concentration of SARS-CoV-2 viral copies this assay can detect is 138 copies/mL. A negative result does not preclude SARS-Cov-2 infection and should not be used as the sole basis for treatment or other patient management decisions. A negative result may occur with  improper specimen collection/handling, submission of specimen other than nasopharyngeal swab, presence of viral mutation(s) within the areas targeted by this assay, and  inadequate number of viral copies(<138 copies/mL). A negative result must be combined with clinical observations, patient history, and epidemiological information. The expected result is Negative.  Fact Sheet for Patients:  EntrepreneurPulse.com.au  Fact Sheet for Healthcare Providers:  IncredibleEmployment.be  This test is no t yet approved or cleared by the [place] FDA and  has been authorized for detection and/or diagnosis of SARS-CoV-2 by FDA under an Emergency Use Authorization (EUA). This EUA will remain  in effect (meaning this test can be used) for the duration of the COVID-19 declaration under Section 564(b)(1) of the Act, 21 U.S.C.section 360bbb-3(b)(1), unless the authorization is terminated  or revoked sooner.       Influenza A by PCR NEGATIVE NEGATIVE Final   Influenza B by PCR NEGATIVE NEGATIVE Final    Comment: (NOTE) The Xpert Xpress SARS-CoV-2/FLU/RSV plus assay is intended  as an aid in the diagnosis of influenza from Nasopharyngeal swab specimens and should not be used as a sole basis for treatment. Nasal washings and aspirates are unacceptable for Xpert Xpress SARS-CoV-2/FLU/RSV testing.  Fact Sheet for Patients: EntrepreneurPulse.com.au  Fact Sheet for Healthcare Providers: IncredibleEmployment.be  This test is not yet approved or cleared by the [place] FDA and has been authorized for detection and/or diagnosis of SARS-CoV-2 by FDA under an Emergency Use Authorization (EUA). This EUA will remain in effect (meaning this test can be used) for the duration of the COVID-19 declaration under Section 564(b)(1) of the Act, 21 U.S.C. section 360bbb-3(b)(1), unless the authorization is terminated or revoked.  Performed at [facility], [place] [address] [place], [place] [address]   Body fluid culture w Gram Stain     Status: None (Preliminary result)    Collection Time: 06/11/21  1:44 PM   Specimen: Pleural Fluid  Result Value Ref Range Status   Specimen Description   Final    PLEURAL Performed at [facility] [address], [place], [place] [address]    Special Requests   Final    NONE Performed at [facility], [place] [address] [place], [place] [address]    Gram Stain   Final    FEW WBC PRESENT, PREDOMINANTLY MONONUCLEAR NO ORGANISMS SEEN    Culture   Final    NO GROWTH < 12 HOURS Performed at [facility] [address], [place], [place] [address]    Report Status PENDING  Incomplete     Radiology Studies: DG Chest Port 1 View  Result Date: 06/11/2021 CLINICAL DATA:  Status post thoracentesis. EXAM: PORTABLE CHEST 1 VIEW COMPARISON:  June 11, 2021. FINDINGS: No pneumothorax is noted. Right pleural effusion is significantly smaller. IMPRESSION: No pneumothorax status post thoracentesis. Electronically Signed   By: Marijo Conception M.D.   On: 06/11/2021 14:33   ECHOCARDIOGRAM LIMITED  Result Date: 06/11/2021    ECHOCARDIOGRAM LIMITED REPORT   Patient Name:   MCLAIN FREER Date of Exam: 06/11/2021 Medical Rec #:  [id number]        Height:       66.0 in Accession #:    [id number]       Weight:       136.7 lb Date of Birth:  [birth year]        BSA:          1.701 m Patient Age:    51 years         BP:           137/87 mmHg Patient Gender: M                HR:           104 bpm. Exam Location:  Inpatient Procedure: Limited Echo, Limited Color Doppler and Cardiac Doppler Indications:    CHF-Acute Systolic I33.82  History:        Patient has prior history of Echocardiogram examinations, most                 recent 06/02/2021. CHF and Idiopathic CMP, CAD, COPD; Risk                 Factors:ETOH, Dyslipidemia and Hypertension.  Sonographer:    Bernadene Person RDCS Referring Phys: [id number] [place]  1. Limited study no complete doppler/color flow. EF 20% global hypokinesis RV  normal size and function Moderate  LAE, mild RAE. IVC dilated Moderate TR mild AR with AV sclerosis ? pleural effusion no pericardial effusion.  2. Left ventricular ejection fraction, by estimation, is 20%. The left ventricle has severely decreased function. The left ventricle has no regional wall motion abnormalities. The left ventricular internal cavity size was severely dilated. Left ventricular diastolic function could not be evaluated.  3. Right ventricular systolic function is normal. The right ventricular size is normal. There is mildly elevated pulmonary artery systolic pressure.  4. Left atrial size was moderately dilated.  5. Right atrial size was mildly dilated.  6. Moderate pleural effusion in the left lateral region.  7. The mitral valve was not assessed. No evidence of mitral valve regurgitation. No evidence of mitral stenosis.  8. Tricuspid valve regurgitation is moderate.  9. The aortic valve is abnormal. Aortic valve regurgitation is mild. Mild to moderate aortic valve sclerosis/calcification is present, without any evidence of aortic stenosis. 10. The inferior vena cava is dilated in size with >50% respiratory variability, suggesting right atrial pressure of 8 mmHg. FINDINGS  Left Ventricle: Left ventricular ejection fraction, by estimation, is 20%. The left ventricle has severely decreased function. The left ventricle has no regional wall motion abnormalities. The left ventricular internal cavity size was severely dilated. There is no left ventricular hypertrophy. Left ventricular diastolic function could not be evaluated. Right Ventricle: The right ventricular size is normal. No increase in right ventricular wall thickness. Right ventricular systolic function is normal. There is mildly elevated pulmonary artery systolic pressure. The tricuspid regurgitant velocity is 3.08  m/s, and with an assumed right atrial pressure of 3 mmHg, the estimated right ventricular systolic pressure is 48.5 mmHg. Left  Atrium: Left atrial size was moderately dilated. Right Atrium: Right atrial size was mildly dilated. Pericardium: There is no evidence of pericardial effusion. Mitral Valve: The mitral valve was not assessed. No evidence of mitral valve stenosis. Tricuspid Valve: The tricuspid valve is normal in structure. Tricuspid valve regurgitation is moderate . No evidence of tricuspid stenosis. Aortic Valve: The aortic valve is abnormal. Aortic valve regurgitation is mild. Mild to moderate aortic valve sclerosis/calcification is present, without any evidence of aortic stenosis. Pulmonic Valve: The pulmonic valve was normal in structure. Pulmonic valve regurgitation is not visualized. No evidence of pulmonic stenosis. Aorta: The aortic root is normal in size and structure and aortic root could not be assessed. Venous: The inferior vena cava is dilated in size with greater than 50% respiratory variability, suggesting right atrial pressure of 8 mmHg. IAS/Shunts: The interatrial septum was not assessed. Additional Comments: Limited study no complete doppler/color flow. EF 20% global hypokinesis RV normal size and function Moderate LAE, mild RAE. IVC dilated Moderate TR mild AR with AV sclerosis ? pleural effusion no pericardial effusion. There is a moderate pleural effusion in the left lateral region. RIGHT VENTRICLE RV S prime:     7.51 cm/s TAPSE (M-mode): 1.3 cm RIGHT ATRIUM           Index RA Area:     17.00 cm RA Volume:   48.50 ml  28.51 ml/m  TRICUSPID VALVE TR Peak grad:   37.9 mmHg TR Vmax:        308.00 cm/s Jenkins Rouge MD Electronically signed by Jenkins Rouge MD Signature Date/Time: 06/11/2021/2:48:25 PM    Final      Marzetta Board, MD, PhD [facility]  Between 7 am - 7 pm I am available, please contact me via Amion (for emergencies) or Securechat (non  urgent messages)  Between 7 pm - 7 am I am not available, please contact night coverage MD/APP via Amion

## 2021-06-12 NOTE — Consult Note (Signed)
Cardiology Consultation:   Patient ID: Collin Young; [id number]; 1951/06/08   Admit date: 06/11/2021 Date of Consult: 06/12/2021  Primary Care Provider: Darreld Mclean, MD Primary Cardiologist: Skeet Latch, MD  Primary Electrophysiologist:  None   Patient Profile:   Collin Young is a 70 y.o. male with a hx of CAD who is being seen today for the evaluation of PE and acute respiratory failure  at the request of Dr. Cruzita Lederer.  History of Present Illness:   Collin Young  has a history of cardiomyopathy dating back to 2018.  At that time he had shortness of breath and acute presentation with an EF of 15%.  Cath revealed occluded proximal circumflex with left to left collaterals.  He had 99% proximal mid RCA.  He was supported with milrinone and Lasix.  He subsequently was found to have ejection fraction improved to 45 to 50%.  Management was compromised by some orthostatic hypotension which limited med titration.  It was decided to manage his coronary disease medically as PCI of his right coronary artery would require hemodynamic support was thought to be high risk.  He was thought to be too frail for surgery at that time.  Last office visit to address this was in January of this year.  He presented in September to [facility] with progressive shortness of breath.  He was hypotensive and tachycardic.  There was a large right pleural effusion.  There was questionable PE on his CT but ultimately this was thought to be just artifact with poor opacification.  His ejection fraction was 25 to 30% with moderate mitral regurgitation and moderate aortic insufficiency.  He was managed with diuresis.  He was just discharged on 10 4.  He presented to the emergency room yesterday.  He was brought by EMS.  He was treated with Solu-Medrol and nebulizer treatment.  CT demonstrated right lower lobe pulmonary embolism.  He was started on heparin.  He is also being managed with antibiotics for possible  pneumonia.  His BNP is elevated greater than 4500.  Echocardiogram is essentially unchanged and listed below.  Did have pleural effusions status post thoracentesis.  This was transudative.  He says that he woke up weak and SOB.  He tried to go to work and he could not walk more than 15 feet.  He denies weight gain or swelling.  He has had no PND or orthopnea.  He denies cough fevers or chills.  He otherwise says he felt OK after discharge until yesterday.    Past Medical History:  Diagnosis Date   Chronic combined systolic and diastolic CHF (congestive heart failure) ([place])    followed by [name]   COPD (chronic obstructive pulmonary disease) ([place])    Coronary artery disease cardiologist-- [name]   per cardiac cath 01-05-2017  chronic total occlusion pLCx with collaterals, 99% severe calcified prox. to mid RCA, otherwise mild to moderate CAD (medically managed)   Esophageal cancer, stage IIIB [facility]) oncologist-  dr ennever/  dr moody   dx 2010  SCC Stage IIIB completed chemoradiation;  localized recurrent left piriform sinus 01/ 2015,  completed concurrent chemoradiatoin 04/ 2015   GERD (gastroesophageal reflux disease)    09-07-2018   no issues since Gtube removed 06/ 2019   History of alcohol abuse    quit 2001   History of cancer chemotherapy    2010;   2015   History of radiation therapy    10-19-2013 to  12-05-2013 pyriform sinus 69.96 Gy/65fx;   Radiation completed 2010 for esophageal cancer   History of seizure 2001   alcohol withdrawal   HOH (hard of hearing)    Hyperlipidemia    Hypertension    Ischemic cardiomyopathy 12/2016   01-03-2017  echo,  ef 10-15%/   echo 05-06-2017 EF improved to 45-50%   Prostate cancer [facility]) urologist-  dr ottelin/  oncologist-- [name]   dx 05-27-2018--- Stage T1c,  Gleason 4+3,  PSA 9.3--  scheduled for brachytherapy 09-23-2017   Renal cyst, left     Past Surgical History:  Procedure Laterality Date   CARDIOVASCULAR  STRESS TEST  10/09/09   normal nuclearr stress test, EF 57% ([place])   IR GASTROSTOMY TUBE MOD SED  01/13/2017   IR GASTROSTOMY TUBE REMOVAL  02/03/2018   IR PATIENT EVAL TECH 0-60 MINS  03/25/2017   IR REMOVAL TUN ACCESS W/ PORT W/O FL MOD SED  01/15/2017   IR REPLACE G-TUBE SIMPLE WO FLUORO  01/13/2018   LARYNGOSCOPY N/A 09/15/2013   Procedure: LARYNGOSCOPY;  Surgeon: [name], MD;  Location: [place];  Service: ENT;  Laterality: N/A;  direct laryngoscopy with biopsy and esophagoscopy   RADIOACTIVE SEED IMPLANT N/A 09/23/2018   Procedure: RADIOACTIVE SEED IMPLANT/BRACHYTHERAPY IMPLANT;  Surgeon: [name], MD;  Location: [place];  Service: Urology;  Laterality: N/A;   RIGHT/LEFT HEART CATH AND CORONARY ANGIOGRAPHY N/A 01/05/2017   Procedure: Right/Left Heart Cath and Coronary Angiography;  Surgeon: [name], MD;  Location: [place] [facility];  Service: Cardiovascular;  Laterality: N/A;   TRANSTHORACIC ECHOCARDIOGRAM  05/06/2017   ef 45-50%,  grade 1 diastolic dysfunction/  AV severe calcified non coronary cusp with moderate regurg. , no stenosis (valve area per echo 01-05-2017 1.08cm^2)/  mild MR, TR, and PR/  mild LAE     Home Medications:  Prior to Admission medications   Medication Sig Start Date End Date Taking? Authorizing Provider  albuterol (VENTOLIN HFA) 108 (90 Base) MCG/ACT inhaler INHALE 2 PUFFS INTO THE LUNGS EVERY 6 HOURS AS NEEDED FOR WHEEZING OR SHORTNESS OF BREATH Patient taking differently: Inhale 2 puffs into the lungs every 6 (six) hours as needed for wheezing or shortness of breath. 03/28/21  Yes Copland, Gay Filler, MD  BAYER LOW DOSE 81 MG EC tablet Take 81 mg by mouth daily. Swallow whole.   Yes [provider]  dapagliflozin propanediol (FARXIGA) 10 MG TABS tablet Take 1 tablet (10 mg total) by mouth daily. 05/12/21  Yes Georgette Shell, MD  ENTRESTO 24-26 MG TAKE 1 TABLET BY MOUTH TWICE DAILY Patient taking differently: Take  0.5 tablets by mouth 2 (two) times daily. 11/11/20  Yes Skeet Latch, MD  Evolocumab (REPATHA SURECLICK) 938 MG/ML SOAJ Inject 140 mg into the skin every 14 (fourteen) days. 11/14/20  Yes Skeet Latch, MD  fentaNYL (DURAGESIC) 12 MCG/HR Place 1 patch onto the skin every 3 (three) days. 06/04/21  Yes [name], MD  furosemide (LASIX) 40 MG tablet Take 40 mg of Lasix if you gain weight more than 2 pounds per day or more than 5 pounds per week Patient taking differently: Take 40 mg by mouth every Monday, Wednesday, and Friday. 05/11/21  Yes Georgette Shell, MD  Multiple Vitamin (MULTIVITAMIN) tablet Take 1 tablet by mouth 3 (three) times a week.   Yes [provider]  polyethylene glycol (MIRALAX / GLYCOLAX) 17 g packet Take 17 g by mouth daily as needed for  mild constipation. Patient taking differently: Take 17 g by mouth daily as needed for mild constipation (MIX AND DRINK AS DIRECTED). 05/11/21  Yes Georgette Shell, MD  pravastatin (PRAVACHOL) 80 MG tablet Take 1 tablet (80 mg total) by mouth every evening. 04/30/21 07/29/21 Yes Skeet Latch, MD  metoprolol succinate (TOPROL-XL) 25 MG 24 hr tablet Take 0.5 tablets (12.5 mg total) by mouth daily. Patient not taking: Reported on 06/11/2021 05/12/21   Georgette Shell, MD    Inpatient Medications: Scheduled Meds:  guaiFENesin  600 mg Oral BID   Continuous Infusions:  heparin 1,100 Units/hr (06/12/21 0305)   PRN Meds: acetaminophen **OR** acetaminophen, levalbuterol, phenol  Allergies:   No Known Allergies  Social History:   Social History   Socioeconomic History   Marital status: Legally Separated    Spouse name: Not on file   Number of children: 2   Years of education: Not on file   Highest education level: Not on file  Occupational History   Occupation: retired  Tobacco Use   Smoking status: Former    Packs/day: 1.00    Years: 40.00    Pack years: 40.00    Types: Cigarettes    Start date:  11/08/1960    Quit date: 08/31/2009    Years since quitting: 11.7   Smokeless tobacco: Former    Types: Chew    Quit date: 09/01/1999  Vaping Use   Vaping Use: Never used  Substance and Sexual Activity   Alcohol use: Not Currently    Alcohol/week: 0.0 standard drinks    Comment: quit in 2001   Drug use: No    Comment: back in the day used cocaine,alcohol, marijuana   Sexual activity: Yes    Partners: Female    Birth control/protection: None  Other Topics Concern   Not on file  Social History Narrative   Not on file   Social Determinants of Health   Financial Resource Strain: Not on file  Food Insecurity: Not on file  Transportation Needs: Not on file  Physical Activity: Not on file  Stress: Not on file  Social Connections: Not on file  Intimate Partner Violence: Not on file    Family History:    Family History  Problem Relation Age of Onset   Breast cancer Mother    Prostate cancer Neg Hx    Colon cancer Neg Hx    Pancreatic cancer Neg Hx      ROS:  Please see the history of present illness.   All other ROS reviewed and negative.     Physical Exam/Data:   Vitals:   06/11/21 1500 06/11/21 1557 06/11/21 2008 06/12/21 0439  BP: 135/88 139/83 (!) 151/93 (!) 126/94  Pulse: (!) 105 (!) 106 (!) 109 (!) 106  Resp: 20 20 20 20   Temp:  97.8 F (36.6 C) 98 F (36.7 C) 98 F (36.7 C)  TempSrc:   Oral Oral  SpO2: 97% 100% 100% 91%  Weight:    52.7 kg  Height:        Intake/Output Summary (Last 24 hours) at 06/12/2021 1011 Last data filed at 06/12/2021 4235 Gross per 24 hour  Intake 134.25 ml  Output 1200 ml  Net -1065.75 ml   Filed Weights   06/11/21 0109 06/12/21 0439  Weight: 62 kg 52.7 kg   Body mass index is 18.75 kg/m.  GENERAL:  Well appearing HEENT:   Pupils equal round and reactive, fundi not visualized, oral mucosa unremarkable NECK:  No  jugular venous distention, waveform within normal limits, carotid upstroke brisk and symmetric, no bruits, no  thyromegaly LYMPHATICS:  No cervical, inguinal adenopathy LUNGS:   Clear to auscultation bilaterally BACK:  No CVA tenderness CHEST:   Unremarkable HEART:  PMI not displaced or sustained,S1 and S2 within normal limits, no S3, no S4, no clicks, no rubs,  murmurs ABD:  Flat, positive bowel sounds normal in frequency in pitch, no bruits, no rebound, no guarding, no midline pulsatile mass, no hepatomegaly, no splenomegaly EXT:  2 plus pulses throughout, no  edema, no cyanosis no clubbing SKIN:  No rashes no nodules NEURO:   Cranial nerves II through XII grossly intact, motor grossly intact throughout PSYCH:    Cognitively intact, oriented to person place and time   EKG:  The EKG was personally reviewed and demonstrates:  NSR, rate 100, axis WNL, LVH with repolarization changes.  No change from previous.    Telemetry:  Telemetry was personally reviewed and demonstrates:  NSR  Relevant CV Studies:  ECHO:     06/11/21  1. Limited study no complete doppler/color flow. EF 20% global  hypokinesis RV normal size and function Moderate LAE, mild RAE. IVC  dilated Moderate TR mild AR with AV sclerosis ? pleural effusion no  pericardial effusion.   2. Left ventricular ejection fraction, by estimation, is 20%. The left  ventricle has severely decreased function. The left ventricle has no  regional wall motion abnormalities. The left ventricular internal cavity  size was severely dilated. Left  ventricular diastolic function could not be evaluated.   3. Right ventricular systolic function is normal. The right ventricular  size is normal. There is mildly elevated pulmonary artery systolic  pressure.   4. Left atrial size was moderately dilated.   5. Right atrial size was mildly dilated.   6. Moderate pleural effusion in the left lateral region.   7. The mitral valve was not assessed. No evidence of mitral valve  regurgitation. No evidence of mitral stenosis.   8. Tricuspid valve regurgitation is  moderate.   9. The aortic valve is abnormal. Aortic valve regurgitation is mild. Mild  to moderate aortic valve sclerosis/calcification is present, without any  evidence of aortic stenosis.  10. The inferior vena cava is dilated in size with >50% respiratory  variability, suggesting right atrial pressure of 8 mmHg.   Laboratory Data:  Chemistry Recent Labs  Lab 06/11/21 0225 06/12/21 0327  NA 132* 136  K 3.8 4.2  CL 97* 101  CO2 24 24  GLUCOSE 136* 113*  BUN 28* 32*  CREATININE 1.58* 1.74*  CALCIUM 9.1 9.2  GFRNONAA 47* 42*  ANIONGAP 11 11    Recent Labs  Lab 06/11/21 0225 06/12/21 0327  PROT 7.4 6.7  ALBUMIN 4.0 3.5  AST 31 22  ALT 18 15  ALKPHOS 38 34*  BILITOT 0.7 0.6   Hematology Recent Labs  Lab 06/11/21 0225 06/12/21 0327  WBC 4.2 8.8  RBC 4.81 4.68  HGB 13.0 13.1  HCT 41.0 39.0  MCV 85.2 83.3  MCH 27.0 28.0  MCHC 31.7 33.6  RDW 14.9 14.9  PLT 207 217   Cardiac EnzymesNo results for input(s): TROPONINI in the last 168 hours. No results for input(s): TROPIPOC in the last 168 hours.  BNP Recent Labs  Lab 06/11/21 0225  BNP >4,500.0*    DDimer No results for input(s): DDIMER in the last 168 hours.  Radiology/Studies:  DG Chest 2 View  Result Date: 06/11/2021 CLINICAL DATA:  Shortness of breath.  COPD. EXAM: CHEST - 2 VIEW COMPARISON:  06/02/2021 FINDINGS: Shallow inspiration. Heart size and pulmonary vascularity are normal for technique. Small bilateral pleural effusions, greater on the right. Infiltration or atelectasis in the right lung base, likely pneumonia. There is progression since the previous study. Calcified and tortuous aorta. No pneumothorax. IMPRESSION: Small bilateral pleural effusions, greater on the right. Infiltration or atelectasis in the right lung base likely represents pneumonia. Progression since prior study. Electronically Signed   By: Lucienne Capers M.D.   On: 06/11/2021 01:28   CT Angio Chest PE W/Cm &/Or Wo Cm  Result  Date: 06/11/2021 CLINICAL DATA:  Shortness of breath.  Pulmonary embolism suspected EXAM: CT ANGIOGRAPHY CHEST WITH CONTRAST TECHNIQUE: Multidetector CT imaging of the chest was performed using the standard protocol during bolus administration of intravenous contrast. Multiplanar CT image reconstructions and MIPs were obtained to evaluate the vascular anatomy. CONTRAST:  42mL OMNIPAQUE IOHEXOL 350 MG/ML SOLN COMPARISON:  05/08/2021 FINDINGS: Cardiovascular: Enlarged heart. Motion degraded study but still positive for a tubular filling defect in lobar to segmental right lower lobe pulmonary arteries, nonocclusive. Bilateral posterior basal segment left lower lobe pulmonary arteries are nonenhancing and small, suspect remote emboli. No RV dilatation when compared to the left ventricle. No enhancement of the systemic arterial tree. Atheromatous calcification of the aorta and coronaries. Mediastinum/Nodes: Negative for adenopathy or mass. Lungs/Pleura: Large right and small left pleural effusions. Interlobular septal thickening. No pneumothorax. No consolidation. Fibrotic scar-like appearance in the apical lungs. Upper Abdomen: Atheromatous calcification. Left hilar calcification is likely atheromatous based on prior CT. Musculoskeletal: No acute finding. Review of the MIP images confirms the above findings. Critical Value/emergent results were called by telephone at the time of interpretation on 06/11/2021 at 5:18 am to provider [facility] , who verbally acknowledged these results. IMPRESSION: 1. Positive for lobar to segmental pulmonary embolism in the right lower lobe. 2. Chronic poor enhancement of lower lobe segmental vessels which may be related to remote emboli or chronic shunting away from compressive atelectasis. 3. Large right and small left pleural effusion similar to CT 1 month ago. Mild interstitial edema. 4. Aortic Atherosclerosis (ICD10-I70.0).  Coronary atherosclerosis. Electronically Signed   By:  Jorje Guild M.D.   On: 06/11/2021 05:20   DG Chest Port 1 View  Result Date: 06/11/2021 CLINICAL DATA:  Status post thoracentesis. EXAM: PORTABLE CHEST 1 VIEW COMPARISON:  June 11, 2021. FINDINGS: No pneumothorax is noted. Right pleural effusion is significantly smaller. IMPRESSION: No pneumothorax status post thoracentesis. Electronically Signed   By: Marijo Conception M.D.   On: 06/11/2021 14:33   ECHOCARDIOGRAM LIMITED  Result Date: 06/11/2021    ECHOCARDIOGRAM LIMITED REPORT   Patient Name:   JERRIK HOUSHOLDER Date of Exam: 06/11/2021 Medical Rec #:  [id number]        Height:       66.0 in Accession #:    [id number]       Weight:       136.7 lb Date of Birth:  08-20-1951        BSA:          1.701 m Patient Age:    68 years         BP:           137/87 mmHg Patient Gender: M                HR:           104  bpm. Exam Location:  Inpatient Procedure: Limited Echo, Limited Color Doppler and Cardiac Doppler Indications:    CHF-Acute Systolic X38.18  History:        Patient has prior history of Echocardiogram examinations, most                 recent 06/02/2021. CHF and Idiopathic CMP, CAD, COPD; Risk                 Factors:ETOH, Dyslipidemia and Hypertension.  Sonographer:    Bernadene Person RDCS Referring Phys: [id number] [place]  1. Limited study no complete doppler/color flow. EF 20% global hypokinesis RV normal size and function Moderate LAE, mild RAE. IVC dilated Moderate TR mild AR with AV sclerosis ? pleural effusion no pericardial effusion.  2. Left ventricular ejection fraction, by estimation, is 20%. The left ventricle has severely decreased function. The left ventricle has no regional wall motion abnormalities. The left ventricular internal cavity size was severely dilated. Left ventricular diastolic function could not be evaluated.  3. Right ventricular systolic function is normal. The right ventricular size is normal. There is mildly elevated pulmonary artery systolic  pressure.  4. Left atrial size was moderately dilated.  5. Right atrial size was mildly dilated.  6. Moderate pleural effusion in the left lateral region.  7. The mitral valve was not assessed. No evidence of mitral valve regurgitation. No evidence of mitral stenosis.  8. Tricuspid valve regurgitation is moderate.  9. The aortic valve is abnormal. Aortic valve regurgitation is mild. Mild to moderate aortic valve sclerosis/calcification is present, without any evidence of aortic stenosis. 10. The inferior vena cava is dilated in size with >50% respiratory variability, suggesting right atrial pressure of 8 mmHg. FINDINGS  Left Ventricle: Left ventricular ejection fraction, by estimation, is 20%. The left ventricle has severely decreased function. The left ventricle has no regional wall motion abnormalities. The left ventricular internal cavity size was severely dilated. There is no left ventricular hypertrophy. Left ventricular diastolic function could not be evaluated. Right Ventricle: The right ventricular size is normal. No increase in right ventricular wall thickness. Right ventricular systolic function is normal. There is mildly elevated pulmonary artery systolic pressure. The tricuspid regurgitant velocity is 3.08  m/s, and with an assumed right atrial pressure of 3 mmHg, the estimated right ventricular systolic pressure is 96.7 mmHg. Left Atrium: Left atrial size was moderately dilated. Right Atrium: Right atrial size was mildly dilated. Pericardium: There is no evidence of pericardial effusion. Mitral Valve: The mitral valve was not assessed. No evidence of mitral valve stenosis. Tricuspid Valve: The tricuspid valve is normal in structure. Tricuspid valve regurgitation is moderate . No evidence of tricuspid stenosis. Aortic Valve: The aortic valve is abnormal. Aortic valve regurgitation is mild. Mild to moderate aortic valve sclerosis/calcification is present, without any evidence of aortic stenosis. Pulmonic  Valve: The pulmonic valve was normal in structure. Pulmonic valve regurgitation is not visualized. No evidence of pulmonic stenosis. Aorta: The aortic root is normal in size and structure and aortic root could not be assessed. Venous: The inferior vena cava is dilated in size with greater than 50% respiratory variability, suggesting right atrial pressure of 8 mmHg. IAS/Shunts: The interatrial septum was not assessed. Additional Comments: Limited study no complete doppler/color flow. EF 20% global hypokinesis RV normal size and function Moderate LAE, mild RAE. IVC dilated Moderate TR mild AR with AV sclerosis ? pleural effusion no pericardial effusion. There is a moderate pleural effusion in the  left lateral region. RIGHT VENTRICLE RV S prime:     7.51 cm/s TAPSE (M-mode): 1.3 cm RIGHT ATRIUM           Index RA Area:     17.00 cm RA Volume:   48.50 ml  28.51 ml/m  TRICUSPID VALVE TR Peak grad:   37.9 mmHg TR Vmax:        308.00 cm/s Jenkins Rouge MD Electronically signed by Jenkins Rouge MD Signature Date/Time: 06/11/2021/2:48:25 PM    Final     Assessment and Plan:   Acute pulmonary embolism:    There was a suggestion of this during his last hospitalization but the CT was not definitive.  Now starting Eliquis.    Holding ASA given the addition of DOAC.  I would suggest that he might be lifelong DOAC given the fact that this might be unprovoked as he is mobile and he has high risk with a history of cancers.    Acute on chronic systolic diastolic heart failure:   He has severe global left ventricular dysfunction with an ischemic etiology.  He was tolerating low dose [place] as an out patient and I would continue these at discharge.  His Lasix I think can be back to 3 x per week at discharge perhaps 20 mg (lower dose).  I think he will need some baseline diuretic. Holding the [place] today with increased creatinine.  Holding the diuretic as he seems euvolemic.  However, he will need to  have these restarted before discharge.   I will resume the low dose beta blocker today.   CKD3a:    Creat is up slightly.     For questions or updates, please contact [place] Please consult [email or web address] for contact info under Cardiology/STEMI.   Signed, Minus Breeding, MD  06/12/2021 10:11 AM

## 2021-06-13 DIAGNOSIS — I5023 Acute on chronic systolic (congestive) heart failure: Secondary | ICD-10-CM | POA: Diagnosis not present

## 2021-06-13 LAB — CBC
HCT: 42.5 % (ref 39.0–52.0)
Hemoglobin: 13.5 g/dL (ref 13.0–17.0)
MCH: 27.3 pg (ref 26.0–34.0)
MCHC: 31.8 g/dL (ref 30.0–36.0)
MCV: 85.9 fL (ref 80.0–100.0)
Platelets: 208 10*3/uL (ref 150–400)
RBC: 4.95 MIL/uL (ref 4.22–5.81)
RDW: 15.2 % (ref 11.5–15.5)
WBC: 7 10*3/uL (ref 4.0–10.5)
nRBC: 0 % (ref 0.0–0.2)

## 2021-06-13 LAB — BASIC METABOLIC PANEL
Anion gap: 12 (ref 5–15)
BUN: 41 mg/dL — ABNORMAL HIGH (ref 8–23)
CO2: 25 mmol/L (ref 22–32)
Calcium: 8.9 mg/dL (ref 8.9–10.3)
Chloride: 94 mmol/L — ABNORMAL LOW (ref 98–111)
Creatinine, Ser: 1.9 mg/dL — ABNORMAL HIGH (ref 0.61–1.24)
GFR, Estimated: 37 mL/min — ABNORMAL LOW (ref >60)
Glucose, Bld: 164 mg/dL — ABNORMAL HIGH (ref 70–99)
Potassium: 3.9 mmol/L (ref 3.5–5.1)
Sodium: 131 mmol/L — ABNORMAL LOW (ref 135–145)

## 2021-06-13 MED ORDER — LIDOCAINE VISCOUS HCL 2 % MT SOLN
15.0000 mL | OROMUCOSAL | Status: DC | PRN
  Filled 2021-06-13: qty 15

## 2021-06-13 MED ORDER — MENTHOL 3 MG MT LOZG
1.0000 | LOZENGE | OROMUCOSAL | Status: DC | PRN
  Administered 2021-06-13: 3 mg via ORAL
  Filled 2021-06-13: qty 9

## 2021-06-13 MED ORDER — METOPROLOL SUCCINATE ER 25 MG PO TB24
25.0000 mg | ORAL_TABLET | Freq: Every day | ORAL | Status: DC
  Administered 2021-06-13 – 2021-06-14 (×2): 25 mg via ORAL
  Filled 2021-06-13 (×2): qty 1

## 2021-06-13 MED ORDER — ONDANSETRON HCL 4 MG/2ML IJ SOLN
4.0000 mg | Freq: Three times a day (TID) | INTRAMUSCULAR | Status: DC | PRN
  Administered 2021-06-13: 4 mg via INTRAVENOUS
  Filled 2021-06-13: qty 2

## 2021-06-13 NOTE — Care Management Important Message (Signed)
Important Message  Patient Details IM Letter given to the Patient. Name: Collin Young MRN: [id number] Date of Birth: 17-Jun-1951   Medicare Important Message Given:  Yes     [name] 06/13/2021, 12:57 PM

## 2021-06-13 NOTE — Progress Notes (Signed)
Progress Note  Patient Name: Collin Young Date of Encounter: 06/13/2021  Primary Cardiologist:   Skeet Latch, MD   Subjective   Complains of sore throat, difficulty swallowing, weak.  No acute shortness of breath or chest pain.  Inpatient Medications    Scheduled Meds:  apixaban  10 mg Oral BID   Followed by   Derrill Memo ON 06/19/2021] apixaban  5 mg Oral BID   guaiFENesin  600 mg Oral BID   polyethylene glycol  17 g Oral Daily   Continuous Infusions:  PRN Meds: acetaminophen **OR** acetaminophen, levalbuterol, phenol   Vital Signs    Vitals:   06/12/21 1559 06/12/21 1629 06/12/21 2034 06/13/21 0529  BP: 129/87 129/89 (!) 114/96 134/82  Pulse: (!) 104 (!) 101 (!) 106 (!) 102  Resp: (!) 26 20 15 18   Temp: 97.6 F (36.4 C) (!) 97.3 F (36.3 C) 98.7 F (37.1 C) 98.4 F (36.9 C)  TempSrc: Axillary Axillary  Oral  SpO2: 100% 100% 100% 100%  Weight:      Height:        Intake/Output Summary (Last 24 hours) at 06/13/2021 0751 Last data filed at 06/13/2021 0424 Gross per 24 hour  Intake 120 ml  Output 1350 ml  Net -1230 ml   Filed Weights   06/11/21 0109 06/12/21 0439  Weight: 62 kg 52.7 kg    Telemetry    Sinus tachycardia with rate 108 with PVCs.   - Personally Reviewed  ECG    NA - Personally Reviewed  Physical Exam   GEN: No acute distress.   Neck: No  JVD Cardiac: RRR, no murmurs, rubs, positive s3.  Respiratory: Clear  to auscultation bilaterally. GI: Soft, nontender, non-distended  MS: No  edema; No deformity. Neuro:  Nonfocal  Psych: Normal affect   Labs    Chemistry Recent Labs  Lab 06/11/21 0225 06/12/21 0327  NA 132* 136  K 3.8 4.2  CL 97* 101  CO2 24 24  GLUCOSE 136* 113*  BUN 28* 32*  CREATININE 1.58* 1.74*  CALCIUM 9.1 9.2  PROT 7.4 6.7  ALBUMIN 4.0 3.5  AST 31 22  ALT 18 15  ALKPHOS 38 34*  BILITOT 0.7 0.6  GFRNONAA 47* 42*  ANIONGAP 11 11     Hematology Recent Labs  Lab 06/11/21 0225 06/12/21 0327  06/13/21 0319  WBC 4.2 8.8 7.0  RBC 4.81 4.68 4.95  HGB 13.0 13.1 13.5  HCT 41.0 39.0 42.5  MCV 85.2 83.3 85.9  MCH 27.0 28.0 27.3  MCHC 31.7 33.6 31.8  RDW 14.9 14.9 15.2  PLT 207 217 208    Cardiac EnzymesNo results for input(s): TROPONINI in the last 168 hours. No results for input(s): TROPIPOC in the last 168 hours.   BNP Recent Labs  Lab 06/11/21 0225  BNP >4,500.0*     DDimer No results for input(s): DDIMER in the last 168 hours.   Radiology    DG Chest Port 1 View  Result Date: 06/11/2021 CLINICAL DATA:  Status post thoracentesis. EXAM: PORTABLE CHEST 1 VIEW COMPARISON:  June 11, 2021. FINDINGS: No pneumothorax is noted. Right pleural effusion is significantly smaller. IMPRESSION: No pneumothorax status post thoracentesis. Electronically Signed   By: Marijo Conception M.D.   On: 06/11/2021 14:33   ECHOCARDIOGRAM LIMITED  Result Date: 06/11/2021    ECHOCARDIOGRAM LIMITED REPORT   Patient Name:   Collin Young Date of Exam: 06/11/2021 Medical Rec #:  [id number]  Height:       66.0 in Accession #:    [id number]       Weight:       136.7 lb Date of Birth:  1950-10-31        BSA:          1.701 m Patient Age:    70 years         BP:           137/87 mmHg Patient Gender: M                HR:           104 bpm. Exam Location:  Inpatient Procedure: Limited Echo, Limited Color Doppler and Cardiac Doppler Indications:    CHF-Acute Systolic D78.24  History:        Patient has prior history of Echocardiogram examinations, most                 recent 06/02/2021. CHF and Idiopathic CMP, CAD, COPD; Risk                 Factors:ETOH, Dyslipidemia and Hypertension.  Sonographer:    Bernadene Person RDCS Referring Phys: [id number] [place]  1. Limited study no complete doppler/color flow. EF 20% global hypokinesis RV normal size and function Moderate LAE, mild RAE. IVC dilated Moderate TR mild AR with AV sclerosis ? pleural effusion no pericardial effusion.  2. Left  ventricular ejection fraction, by estimation, is 20%. The left ventricle has severely decreased function. The left ventricle has no regional wall motion abnormalities. The left ventricular internal cavity size was severely dilated. Left ventricular diastolic function could not be evaluated.  3. Right ventricular systolic function is normal. The right ventricular size is normal. There is mildly elevated pulmonary artery systolic pressure.  4. Left atrial size was moderately dilated.  5. Right atrial size was mildly dilated.  6. Moderate pleural effusion in the left lateral region.  7. The mitral valve was not assessed. No evidence of mitral valve regurgitation. No evidence of mitral stenosis.  8. Tricuspid valve regurgitation is moderate.  9. The aortic valve is abnormal. Aortic valve regurgitation is mild. Mild to moderate aortic valve sclerosis/calcification is present, without any evidence of aortic stenosis. 10. The inferior vena cava is dilated in size with >50% respiratory variability, suggesting right atrial pressure of 8 mmHg. FINDINGS  Left Ventricle: Left ventricular ejection fraction, by estimation, is 20%. The left ventricle has severely decreased function. The left ventricle has no regional wall motion abnormalities. The left ventricular internal cavity size was severely dilated. There is no left ventricular hypertrophy. Left ventricular diastolic function could not be evaluated. Right Ventricle: The right ventricular size is normal. No increase in right ventricular wall thickness. Right ventricular systolic function is normal. There is mildly elevated pulmonary artery systolic pressure. The tricuspid regurgitant velocity is 3.08  m/s, and with an assumed right atrial pressure of 3 mmHg, the estimated right ventricular systolic pressure is 43.1 mmHg. Left Atrium: Left atrial size was moderately dilated. Right Atrium: Right atrial size was mildly dilated. Pericardium: There is no evidence of pericardial  effusion. Mitral Valve: The mitral valve was not assessed. No evidence of mitral valve stenosis. Tricuspid Valve: The tricuspid valve is normal in structure. Tricuspid valve regurgitation is moderate . No evidence of tricuspid stenosis. Aortic Valve: The aortic valve is abnormal. Aortic valve regurgitation is mild. Mild to moderate aortic valve sclerosis/calcification is present, without any evidence of  aortic stenosis. Pulmonic Valve: The pulmonic valve was normal in structure. Pulmonic valve regurgitation is not visualized. No evidence of pulmonic stenosis. Aorta: The aortic root is normal in size and structure and aortic root could not be assessed. Venous: The inferior vena cava is dilated in size with greater than 50% respiratory variability, suggesting right atrial pressure of 8 mmHg. IAS/Shunts: The interatrial septum was not assessed. Additional Comments: Limited study no complete doppler/color flow. EF 20% global hypokinesis RV normal size and function Moderate LAE, mild RAE. IVC dilated Moderate TR mild AR with AV sclerosis ? pleural effusion no pericardial effusion. There is a moderate pleural effusion in the left lateral region. RIGHT VENTRICLE RV S prime:     7.51 cm/s TAPSE (M-mode): 1.3 cm RIGHT ATRIUM           Index RA Area:     17.00 cm RA Volume:   48.50 ml  28.51 ml/m  TRICUSPID VALVE TR Peak grad:   37.9 mmHg TR Vmax:        308.00 cm/s Jenkins Rouge MD Electronically signed by Jenkins Rouge MD Signature Date/Time: 06/11/2021/2:48:25 PM    Final     Cardiac Studies   ECHO 06/11/2021  1. Limited study no complete doppler/color flow. EF 20% global  hypokinesis RV normal size and function Moderate LAE, mild RAE. IVC  dilated Moderate TR mild AR with AV sclerosis ? pleural effusion no  pericardial effusion.   2. Left ventricular ejection fraction, by estimation, is 20%. The left  ventricle has severely decreased function. The left ventricle has no  regional wall motion abnormalities. The  left ventricular internal cavity  size was severely dilated. Left  ventricular diastolic function could not be evaluated.   3. Right ventricular systolic function is normal. The right ventricular  size is normal. There is mildly elevated pulmonary artery systolic  pressure.   4. Left atrial size was moderately dilated.   5. Right atrial size was mildly dilated.   6. Moderate pleural effusion in the left lateral region.   7. The mitral valve was not assessed. No evidence of mitral valve  regurgitation. No evidence of mitral stenosis.   8. Tricuspid valve regurgitation is moderate.   9. The aortic valve is abnormal. Aortic valve regurgitation is mild. Mild  to moderate aortic valve sclerosis/calcification is present, without any  evidence of aortic stenosis.  10. The inferior vena cava is dilated in size with >50% respiratory  variability, suggesting right atrial pressure of 8 mmHg.   Patient Profile     70 y.o. male with a history of ischemic cardiomyopathy with an EF previously of 25% with moderate mitral regurgitation.  He has coronary artery disease.  He is admitted with shortness of breath and found to have pulmonary emboli.  Assessment & Plan    Acute PE: Per the primary team.  He is now on oral anticoagulation.  Acute on chronic systolic and diastolic HF:    HR is up.  Restarted Toprol XL today.  Right now he is home dose of Entresto and Wilder Glade are being held.  Diuretics being held because of some mild renal insufficiency.  I had a long conversation with his daughters on the phone about heart failure.  He does drink too much fluid they said and he is not necessarily restricting his salt.  CKD3A: Creatinine rising.  Continue to follow.  Hold meds as above.  For questions or updates, please contact [place] Please consult [email or web address] for contact info  under Cardiology/STEMI.   Signed, Minus Breeding, MD  06/13/2021, 7:51 AM

## 2021-06-13 NOTE — Telephone Encounter (Signed)
Called and [facility]- apologized that I just got this message today Asked her to let me know if I can do anything to help.  Per notes Dr Percival Spanish had a long talk with her about CHF

## 2021-06-13 NOTE — Telephone Encounter (Signed)
Do you know of anything on particular right now that needs to be addressed once pt is discharged? Daughter also asks how soon after discharge does he need to be see? [name] says she can be reached via pts MyChart.

## 2021-06-13 NOTE — Progress Notes (Signed)
PROGRESS NOTE  Collin Young BZJ:[id number] DOB: 03/07/1951 DOA: 06/11/2021 PCP: Darreld Mclean, MD   LOS: 2 days   Brief Narrative / Interim history: 70 year old male with history of combined systolic diastolic CHF, esophageal cancer, prostate cancer, COPD, HLD, CAD, chronic kidney disease stage IIIa comes to the hospital with dyspnea.  Symptoms started the day prior to admission, not relieved by his home inhalers and eventually ended up requiring to come to the hospital.  In the ED CT angiogram showed pulmonary embolism lobar to segmental in the right lower lobe as well as large right and small left pleural effusion along with mild interstitial edema.  Pulmonary was consulted and he is status post thoracentesis on 10/12  Subjective / 24h Interval events: Feeling weak, complains of a sore throat.  Was able to ambulate in the hallway yesterday  Assessment & Plan: Principal Problem Acute pulmonary embolism-patient was maintained on heparin infusion in the first day, no bleeding and seems to be tolerating well.  I discussed with him the risks/benefits for oral anticoagulation, patient prefers oral anticoagulant that can be crushed, he was transitioned to Eliquis on 10/13, tolerating well  Active Problems Acute on chronic combined systolic/diastolic CHF-patient had some evidence of fluid overload on admission with large pleural effusion.  Discussed with pulmonary, it appears to be transudate related to his heart failure. -He was given IV Lasix, hold on due to acute kidney injury -Restarted on his metoprolol, continue to hold Entresto due to AKI  Acute kidney injury on chronic kidney disease stage IIIa-Baseline creatinine 1.2-1.4, currently getting worse at 1.9, likely due to Lasix on admission.  Hold further diuresis, hold Entresto, monitor.  HLD - continue statin   Chronic bilateral pleural effusions - s/p right-sided thoracentesis 10/12   Hx of eosphageal CA  Hx of prostate CA  -outpatient management  Scheduled Meds:  apixaban  10 mg Oral BID   Followed by   Derrill Memo ON 06/19/2021] apixaban  5 mg Oral BID   guaiFENesin  600 mg Oral BID   metoprolol succinate  25 mg Oral Daily   polyethylene glycol  17 g Oral Daily   Continuous Infusions:   PRN Meds:.acetaminophen **OR** acetaminophen, levalbuterol, lidocaine, menthol-cetylpyridinium, phenol  Diet Orders (From admission, onward)     Start     Ordered   06/11/21 1505  Diet Heart Room service appropriate? Yes; Fluid consistency: Thin; Fluid restriction: 1500 mL Fluid  Diet effective now       Question Answer Comment  Room service appropriate? Yes   Fluid consistency: Thin   Fluid restriction: 1500 mL Fluid      06/11/21 1504            DVT prophylaxis:  apixaban (ELIQUIS) tablet 10 mg  apixaban (ELIQUIS) tablet 5 mg     Code Status: Full Code  Family Communication: no family at bedside   Status is: Inpatient  Remains inpatient appropriate because:Inpatient level of care appropriate due to severity of illness  Dispo: The patient is from: Home              Anticipated d/c is to: Home              Patient currently is not medically stable to d/c.   Difficult to place patient No   Level of care: Telemetry  Consultants:  Pulmonary Cardiology  Procedures:  2D echo  Microbiology  none  Antimicrobials: none    Objective: Vitals:   06/12/21 1559 06/12/21 1629 06/12/21  2034 06/13/21 0529  BP: 129/87 129/89 (!) 114/96 134/82  Pulse: (!) 104 (!) 101 (!) 106 (!) 102  Resp: (!) 26 20 15 18   Temp: 97.6 F (36.4 C) (!) 97.3 F (36.3 C) 98.7 F (37.1 C) 98.4 F (36.9 C)  TempSrc: Axillary Axillary  Oral  SpO2: 100% 100% 100% 100%  Weight:      Height:        Intake/Output Summary (Last 24 hours) at 06/13/2021 1303 Last data filed at 06/13/2021 0424 Gross per 24 hour  Intake 120 ml  Output 850 ml  Net -730 ml    Filed Weights   06/11/21 0109 06/12/21 0439  Weight: 62 kg  52.7 kg    Examination:  Constitutional: He is in no distress, in chair Eyes: No scleral icterus ENMT: mmm Neck: normal, supple Respiratory: Lungs are clear bilaterally, no wheezing or crackles heard.  Normal respiratory effort Cardiovascular: Regular rate and rhythm, no murmurs, no peripheral edema Abdomen: Soft, NT, ND, bowel sounds positive Musculoskeletal: no clubbing / cyanosis.  Skin: No rashes appreciated Neurologic: Nonfocal, equal strength   Data Reviewed: I have independently reviewed following labs and imaging studies  CBC: Recent Labs  Lab 06/11/21 0225 06/12/21 0327 06/13/21 0319  WBC 4.2 8.8 7.0  NEUTROABS 3.2  --   --   HGB 13.0 13.1 13.5  HCT 41.0 39.0 42.5  MCV 85.2 83.3 85.9  PLT 207 217 284    Basic Metabolic Panel: Recent Labs  Lab 06/11/21 0225 06/12/21 0327 06/13/21 1014  NA 132* 136 131*  K 3.8 4.2 3.9  CL 97* 101 94*  CO2 24 24 25   GLUCOSE 136* 113* 164*  BUN 28* 32* 41*  CREATININE 1.58* 1.74* 1.90*  CALCIUM 9.1 9.2 8.9    Liver Function Tests: Recent Labs  Lab 06/11/21 0225 06/12/21 0327  AST 31 22  ALT 18 15  ALKPHOS 38 34*  BILITOT 0.7 0.6  PROT 7.4 6.7  ALBUMIN 4.0 3.5    Coagulation Profile: Recent Labs  Lab 06/11/21 0536  INR 1.2    HbA1C: No results for input(s): HGBA1C in the last 72 hours. CBG: No results for input(s): GLUCAP in the last 168 hours.  Recent Results (from the past 240 hour(s))  Resp Panel by RT-PCR (Flu A&B, Covid) Nasopharyngeal Swab     Status: None   Collection Time: 06/11/21  5:25 AM   Specimen: Nasopharyngeal Swab; Nasopharyngeal(NP) swabs in vial transport medium  Result Value Ref Range Status   SARS Coronavirus 2 by RT PCR NEGATIVE NEGATIVE Final    Comment: (NOTE) SARS-CoV-2 target nucleic acids are NOT DETECTED.  The SARS-CoV-2 RNA is generally detectable in upper respiratory specimens during the acute phase of infection. The lowest concentration of SARS-CoV-2 viral copies  this assay can detect is 138 copies/mL. A negative result does not preclude SARS-Cov-2 infection and should not be used as the sole basis for treatment or other patient management decisions. A negative result may occur with  improper specimen collection/handling, submission of specimen other than nasopharyngeal swab, presence of viral mutation(s) within the areas targeted by this assay, and inadequate number of viral copies(<138 copies/mL). A negative result must be combined with clinical observations, patient history, and epidemiological information. The expected result is Negative.  Fact Sheet for Patients:  EntrepreneurPulse.com.au  Fact Sheet for Healthcare Providers:  IncredibleEmployment.be  This test is no t yet approved or cleared by the [place] FDA and  has been authorized for detection and/or  diagnosis of SARS-CoV-2 by FDA under an Emergency Use Authorization (EUA). This EUA will remain  in effect (meaning this test can be used) for the duration of the COVID-19 declaration under Section 564(b)(1) of the Act, 21 U.S.C.section 360bbb-3(b)(1), unless the authorization is terminated  or revoked sooner.       Influenza A by PCR NEGATIVE NEGATIVE Final   Influenza B by PCR NEGATIVE NEGATIVE Final    Comment: (NOTE) The Xpert Xpress SARS-CoV-2/FLU/RSV plus assay is intended as an aid in the diagnosis of influenza from Nasopharyngeal swab specimens and should not be used as a sole basis for treatment. Nasal washings and aspirates are unacceptable for Xpert Xpress SARS-CoV-2/FLU/RSV testing.  Fact Sheet for Patients: EntrepreneurPulse.com.au  Fact Sheet for Healthcare Providers: IncredibleEmployment.be  This test is not yet approved or cleared by the [place] FDA and has been authorized for detection and/or diagnosis of SARS-CoV-2 by FDA under an Emergency Use Authorization (EUA). This EUA  will remain in effect (meaning this test can be used) for the duration of the COVID-19 declaration under Section 564(b)(1) of the Act, 21 U.S.C. section 360bbb-3(b)(1), unless the authorization is terminated or revoked.  Performed at [facility], [place] [address], [place], [place] [address]   Body fluid culture w Gram Stain     Status: None (Preliminary result)   Collection Time: 06/11/21  1:44 PM   Specimen: Pleural Fluid  Result Value Ref Range Status   Specimen Description   Final    PLEURAL Performed at [place] [address] [place], [place] [address]    Special Requests   Final    NONE Performed at [facility], [place] [address], [place], [place] [address]    Gram Stain   Final    FEW WBC PRESENT, PREDOMINANTLY MONONUCLEAR NO ORGANISMS SEEN    Culture   Final    NO GROWTH 2 DAYS Performed at [place] [address] [place], [place] [address]    Report Status PENDING  Incomplete      Radiology Studies: No results found.   Marzetta Board, MD, PhD [facility]  Between 7 am - 7 pm I am available, please contact me via Amion (for emergencies) or Securechat (non urgent messages)  Between 7 pm - 7 am I am not available, please contact night coverage MD/APP via Amion

## 2021-06-14 ENCOUNTER — Inpatient Hospital Stay (HOSPITAL_COMMUNITY): Payer: Medicare Other

## 2021-06-14 DIAGNOSIS — I5020 Unspecified systolic (congestive) heart failure: Secondary | ICD-10-CM | POA: Diagnosis not present

## 2021-06-14 DIAGNOSIS — I4729 Other ventricular tachycardia: Secondary | ICD-10-CM

## 2021-06-14 DIAGNOSIS — I5023 Acute on chronic systolic (congestive) heart failure: Secondary | ICD-10-CM | POA: Diagnosis not present

## 2021-06-14 LAB — BASIC METABOLIC PANEL
Anion gap: 26 — ABNORMAL HIGH (ref 5–15)
BUN: 53 mg/dL — ABNORMAL HIGH (ref 8–23)
CO2: 12 mmol/L — ABNORMAL LOW (ref 22–32)
Calcium: 8.9 mg/dL (ref 8.9–10.3)
Chloride: 95 mmol/L — ABNORMAL LOW (ref 98–111)
Creatinine, Ser: 2.11 mg/dL — ABNORMAL HIGH (ref 0.61–1.24)
GFR, Estimated: 33 mL/min — ABNORMAL LOW (ref >60)
Glucose, Bld: 90 mg/dL (ref 70–99)
Potassium: 5.2 mmol/L — ABNORMAL HIGH (ref 3.5–5.1)
Sodium: 133 mmol/L — ABNORMAL LOW (ref 135–145)

## 2021-06-14 LAB — CBC
HCT: 50.5 % (ref 39.0–52.0)
Hemoglobin: 15.7 g/dL (ref 13.0–17.0)
MCH: 27.3 pg (ref 26.0–34.0)
MCHC: 31.1 g/dL (ref 30.0–36.0)
MCV: 87.8 fL (ref 80.0–100.0)
Platelets: 203 10*3/uL (ref 150–400)
RBC: 5.75 MIL/uL (ref 4.22–5.81)
RDW: 15.3 % (ref 11.5–15.5)
WBC: 11.8 10*3/uL — ABNORMAL HIGH (ref 4.0–10.5)
nRBC: 0 % (ref 0.0–0.2)

## 2021-06-14 MED ORDER — HYDRALAZINE HCL 25 MG PO TABS
25.0000 mg | ORAL_TABLET | Freq: Four times a day (QID) | ORAL | Status: DC
  Administered 2021-06-14 – 2021-06-16 (×7): 25 mg via ORAL
  Filled 2021-06-14 (×7): qty 1

## 2021-06-14 MED ORDER — NITROGLYCERIN 0.4 MG SL SUBL: 0.4000 mg | SUBLINGUAL_TABLET | SUBLINGUAL | Status: DC | PRN

## 2021-06-14 MED ORDER — METOPROLOL SUCCINATE ER 25 MG PO TB24
25.0000 mg | ORAL_TABLET | Freq: Once | ORAL | Status: AC
  Administered 2021-06-14: 25 mg via ORAL
  Filled 2021-06-14: qty 1

## 2021-06-14 MED ORDER — METOPROLOL SUCCINATE ER 50 MG PO TB24
50.0000 mg | ORAL_TABLET | Freq: Every day | ORAL | Status: DC
  Administered 2021-06-15 – 2021-06-16 (×2): 50 mg via ORAL
  Filled 2021-06-14 (×2): qty 1

## 2021-06-14 MED ORDER — ALBUTEROL SULFATE (2.5 MG/3ML) 0.083% IN NEBU
2.5000 mg | INHALATION_SOLUTION | RESPIRATORY_TRACT | Status: DC | PRN
  Administered 2021-06-14 – 2021-06-15 (×2): 2.5 mg via RESPIRATORY_TRACT
  Filled 2021-06-14 (×2): qty 3

## 2021-06-14 MED ORDER — ALPRAZOLAM 0.25 MG PO TABS
0.2500 mg | ORAL_TABLET | Freq: Once | ORAL | Status: AC
  Administered 2021-06-14: 0.25 mg via ORAL
  Filled 2021-06-14: qty 1

## 2021-06-14 NOTE — Progress Notes (Signed)
Pt with increased shortness of breath. RN place pt on 4L O2 sating at 99%. Pt has no complaints of chest pain. Called respiratory and administered breathing treatment. NP Blount made aware. Will continue to monitor pt.

## 2021-06-14 NOTE — Progress Notes (Signed)
Progress Note  Patient Name: Collin Young Date of Encounter: 06/14/2021  Primary Cardiologist: Skeet Latch, MD   Subjective   Had rough night with CP last night.  No SOB.  No palpitations  Inpatient Medications    Scheduled Meds:  apixaban  10 mg Oral BID   Followed by   Derrill Memo ON 06/19/2021] apixaban  5 mg Oral BID   guaiFENesin  600 mg Oral BID   metoprolol succinate  25 mg Oral Daily   polyethylene glycol  17 g Oral Daily   Continuous Infusions:  PRN Meds: acetaminophen **OR** acetaminophen, albuterol, lidocaine, menthol-cetylpyridinium, ondansetron (ZOFRAN) IV, phenol   Vital Signs    Vitals:   06/13/21 2240 06/14/21 0010 06/14/21 0111 06/14/21 0401  BP:  (!) 156/88  (!) 164/91  Pulse:  88  86  Resp:  (!) 23  (!) 26  Temp:  (!) 97.5 F (36.4 C)  (!) 97.5 F (36.4 C)  TempSrc:  Axillary  Rectal  SpO2: 99% 100% 100% 100%  Weight:      Height:        Intake/Output Summary (Last 24 hours) at 06/14/2021 0801 Last data filed at 06/13/2021 2243 Gross per 24 hour  Intake 480 ml  Output 201 ml  Net 279 ml   Filed Weights   06/11/21 0109 06/12/21 0439  Weight: 62 kg 52.7 kg    Telemetry    One run of NSVT 22 beats rate PVCs - Personally Reviewed  ECG    06/14/21:  SR rate 85 Low voltage limb leads lateral ST depression and occasional PVC; ST depressions also seen in 06/11/21 EKG - Personally Reviewed  Physical Exam   GEN: No acute distress.   Neck: No JVD Cardiac: RRR, no murmurs, rubs, or gallops.  Respiratory: Clear to auscultation bilaterally. GI: Soft, nontender, non-distended  MS: No edema; No deformity. Neuro:  Nonfocal  Psych: Normal affect   Labs    Chemistry Recent Labs  Lab 06/11/21 0225 06/12/21 0327 06/13/21 1014 06/14/21 0402  NA 132* 136 131* 133*  K 3.8 4.2 3.9 5.2*  CL 97* 101 94* 95*  CO2 24 24 25  12*  GLUCOSE 136* 113* 164* 90  BUN 28* 32* 41* 53*  CREATININE 1.58* 1.74* 1.90* 2.11*  CALCIUM 9.1 9.2 8.9  8.9  PROT 7.4 6.7  --   --   ALBUMIN 4.0 3.5  --   --   AST 31 22  --   --   ALT 18 15  --   --   ALKPHOS 38 34*  --   --   BILITOT 0.7 0.6  --   --   GFRNONAA 47* 42* 37* 33*  ANIONGAP 11 11 12  26*     Hematology Recent Labs  Lab 06/12/21 0327 06/13/21 0319 06/14/21 0402  WBC 8.8 7.0 11.8*  RBC 4.68 4.95 5.75  HGB 13.1 13.5 15.7  HCT 39.0 42.5 50.5  MCV 83.3 85.9 87.8  MCH 28.0 27.3 27.3  MCHC 33.6 31.8 31.1  RDW 14.9 15.2 15.3  PLT 217 208 203    Cardiac EnzymesNo results for input(s): TROPONINI in the last 168 hours. No results for input(s): TROPIPOC in the last 168 hours.   BNP Recent Labs  Lab 06/11/21 0225  BNP >4,500.0*     DDimer No results for input(s): DDIMER in the last 168 hours.   Radiology    DG Chest 1 View  Result Date: 06/14/2021 CLINICAL DATA:  New onset shortness  of breath EXAM: PORTABLE CHEST 1 VIEW COMPARISON:  06/11/2021 FINDINGS: Cardiac shadow is enlarged but stable. Aortic calcifications are noted. Recurrent right-sided pleural effusion is noted. No focal infiltrate is seen. No bony abnormality is noted. IMPRESSION: Recurrent small right-sided pleural effusion. Electronically Signed   By: Inez Catalina M.D.   On: 06/14/2021 02:21    Cardiac Studies    Transthoracic Echocardiogram: Date: 06/11/21 Results: EF is severely reduced  1. Limited study no complete doppler/color flow. EF 20% global  hypokinesis RV normal size and function Moderate LAE, mild RAE. IVC  dilated Moderate TR mild AR with AV sclerosis ? pleural effusion no  pericardial effusion.   2. Left ventricular ejection fraction, by estimation, is 20%. The left  ventricle has severely decreased function. The left ventricle has no  regional wall motion abnormalities. The left ventricular internal cavity  size was severely dilated. Left  ventricular diastolic function could not be evaluated.   3. Right ventricular systolic function is normal. The right ventricular  size is  normal. There is mildly elevated pulmonary artery systolic  pressure.   4. Left atrial size was moderately dilated.   5. Right atrial size was mildly dilated.   6. Moderate pleural effusion in the left lateral region.   7. The mitral valve was not assessed. No evidence of mitral valve  regurgitation. No evidence of mitral stenosis.   8. Tricuspid valve regurgitation is moderate.   9. The aortic valve is abnormal. Aortic valve regurgitation is mild. Mild  to moderate aortic valve sclerosis/calcification is present, without any  evidence of aortic stenosis.  10. The inferior vena cava is dilated in size with >50% respiratory  variability, suggesting right atrial pressure of 8 mmHg.    Patient Profile     70 y.o. 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 and esophagus s/p chemo and XRT in 2010, CKD stage III A and COPD found to have new PE in the setting of SOB  Assessment & Plan    AKI on CKD Stage III  HFrEF Mild to Moderate AI NSVT - holding SGLT2i, Entresto and diuretics, worsening despite this - minimal GDMT at this time, with HTN will add hydralazine 25 mg O TID - continue metoprolol succinate 25 mg PO daily (increasing dose for tomorrow and adding additonal for today) - has PRN nitro and may need imdur 30 mg PO daily - discussed with [facility] colleagues, may need lasix 06/15/21 despite AKI (CIN component may be worsening AKI)  Acute PE Hx of CAD - on eliquis - LDL managed with repatha and prasvastatin 80 mg PO daily  COPD Malignancy history(Esophageal and prostate) - managed by primary   Discussed with Daughter [name] and Patient  For questions or updates, please contact [facility] Please consult [email or web address] for contact info under Cardiology/STEMI.      Signed, Werner Lean, MD  06/14/2021, 8:01 AM

## 2021-06-14 NOTE — Progress Notes (Signed)
At around 0430, Pt had a new onset of chest pain scoring 5/10 and worsening SOB. 12 lead EKG done and resulted "normal sinus rhythm with pvc's", soon after the EKG his chest pain went away but is still having an increased work of breathing.NP Blount made aware. At the time of this note, Pt is asleep and in no acute distress. Will continue to monitor.

## 2021-06-14 NOTE — Plan of Care (Signed)
  Problem: Education: Goal: Knowledge of General Education information will improve Description: Including pain rating scale, medication(s)/side effects and non-pharmacologic comfort measures Outcome: Progressing   Problem: Clinical Measurements: Goal: Respiratory complications will improve Outcome: Progressing Goal: Cardiovascular complication will be avoided Outcome: Progressing   Problem: Coping: Goal: Level of anxiety will decrease Outcome: Progressing   Problem: Pain Managment: Goal: General experience of comfort will improve Outcome: Progressing

## 2021-06-14 NOTE — Progress Notes (Signed)
PROGRESS NOTE  Collin Young YYQ:[id number] DOB: 12-Mar-1951 DOA: 06/11/2021 PCP: Darreld Mclean, MD   LOS: 3 days   Brief Narrative / Interim history: 70 year old male with history of combined systolic diastolic CHF, esophageal cancer, prostate cancer, COPD, HLD, CAD, chronic kidney disease stage IIIa comes to the hospital with dyspnea.  Symptoms started the day prior to admission, not relieved by his home inhalers and eventually ended up requiring to come to the hospital.  In the ED CT angiogram showed pulmonary embolism lobar to segmental in the right lower lobe as well as large right and small left pleural effusion along with mild interstitial edema.  Pulmonary was consulted and he is status post thoracentesis on 10/12  Subjective / 24h Interval events: Had an episode of chest pain overnight.  Overall feels like his night was rough.  No shortness of breath this morning  Assessment & Plan: Principal Problem Acute pulmonary embolism-patient was maintained on heparin infusion in the first day, no bleeding and seems to be tolerating well.  I discussed with him the risks/benefits for oral anticoagulation, patient prefers oral anticoagulant that can be crushed, he was transitioned to Eliquis on 10/13, continues to tolerate anticoagulation well without evidence of bleeding  Active Problems Acute on chronic combined systolic/diastolic CHF-patient had some evidence of fluid overload on admission with large pleural effusion.  Discussed with pulmonary, it appears to be transudate related to his heart failure. -He was given IV Lasix, hold on due to acute kidney injury -Restarted on his metoprolol, continue to hold Entresto due to AKI.  Started on hydralazine for persistent hypertension today by cardiology.  Discussed with Dr. Horald Pollen  Acute kidney injury on chronic kidney disease stage IIIa-Baseline creatinine 1.2-1.4, received few doses of Lasix on admission but also CT with contrast.   Creatinine continues to rise today raising suspicion for potentially an early CIN picture -Continue to hold Lasix  HLD - continue statin   Chronic bilateral pleural effusions - s/p right-sided thoracentesis 10/12.  A repeat chest x-ray last night because of chest pain showed small right-sided effusion without any acute infiltrates  Sore throat-appeared yesterday, supportive treatment.  Check for strep throat.   Hx of eosphageal CA  Hx of prostate CA -outpatient management  Scheduled Meds:  apixaban  10 mg Oral BID   Followed by   Derrill Memo ON 06/19/2021] apixaban  5 mg Oral BID   guaiFENesin  600 mg Oral BID   hydrALAZINE  25 mg Oral Q6H   metoprolol succinate  25 mg Oral Once   [START ON 06/15/2021] metoprolol succinate  50 mg Oral Daily   polyethylene glycol  17 g Oral Daily   Continuous Infusions:   PRN Meds:.acetaminophen **OR** acetaminophen, albuterol, lidocaine, menthol-cetylpyridinium, nitroGLYCERIN, ondansetron (ZOFRAN) IV, phenol  Diet Orders (From admission, onward)     Start     Ordered   06/11/21 1505  Diet Heart Room service appropriate? Yes; Fluid consistency: Thin; Fluid restriction: 1500 mL Fluid  Diet effective now       Question Answer Comment  Room service appropriate? Yes   Fluid consistency: Thin   Fluid restriction: 1500 mL Fluid      06/11/21 1504            DVT prophylaxis:  apixaban (ELIQUIS) tablet 10 mg  apixaban (ELIQUIS) tablet 5 mg     Code Status: Full Code  Family Communication: no family at bedside   Status is: Inpatient  Remains inpatient appropriate because:Inpatient level of care  appropriate due to severity of illness  Dispo: The patient is from: Home              Anticipated d/c is to: Home              Patient currently is not medically stable to d/c.   Difficult to place patient No   Level of care: Telemetry  Consultants:  Pulmonary Cardiology  Procedures:  2D echo  Microbiology   none  Antimicrobials: none    Objective: Vitals:   06/14/21 0010 06/14/21 0111 06/14/21 0401 06/14/21 0800  BP: (!) 156/88  (!) 164/91 (!) 152/88  Pulse: 88  86 (!) 101  Resp: (!) 23  (!) 26   Temp: (!) 97.5 F (36.4 C)  (!) 97.5 F (36.4 C) (!) 97 F (36.1 C)  TempSrc: Axillary  Rectal Axillary  SpO2: 100% 100% 100%   Weight:      Height:        Intake/Output Summary (Last 24 hours) at 06/14/2021 1150 Last data filed at 06/14/2021 1010 Gross per 24 hour  Intake 720 ml  Output 201 ml  Net 519 ml    Filed Weights   06/11/21 0109 06/12/21 0439  Weight: 62 kg 52.7 kg    Examination:  Constitutional: No distress, in bed Eyes: Anicteric ENMT: mmm, no erythema to oropharynx, no thrush Neck: normal, supple Respiratory: Diminished at the bases, no wheezing, no crackles Cardiovascular: Regular rate and rhythm, no murmurs, no edema Abdomen: Soft, NT, ND, bowel sounds positive Musculoskeletal: no clubbing / cyanosis.  Skin: No rashes appreciated Neurologic: No focal deficits   Data Reviewed: I have independently reviewed following labs and imaging studies  CBC: Recent Labs  Lab 06/11/21 0225 06/12/21 0327 06/13/21 0319 06/14/21 0402  WBC 4.2 8.8 7.0 11.8*  NEUTROABS 3.2  --   --   --   HGB 13.0 13.1 13.5 15.7  HCT 41.0 39.0 42.5 50.5  MCV 85.2 83.3 85.9 87.8  PLT 207 217 208 427    Basic Metabolic Panel: Recent Labs  Lab 06/11/21 0225 06/12/21 0327 06/13/21 1014 06/14/21 0402  NA 132* 136 131* 133*  K 3.8 4.2 3.9 5.2*  CL 97* 101 94* 95*  CO2 24 24 25  12*  GLUCOSE 136* 113* 164* 90  BUN 28* 32* 41* 53*  CREATININE 1.58* 1.74* 1.90* 2.11*  CALCIUM 9.1 9.2 8.9 8.9    Liver Function Tests: Recent Labs  Lab 06/11/21 0225 06/12/21 0327  AST 31 22  ALT 18 15  ALKPHOS 38 34*  BILITOT 0.7 0.6  PROT 7.4 6.7  ALBUMIN 4.0 3.5    Coagulation Profile: Recent Labs  Lab 06/11/21 0536  INR 1.2    HbA1C: No results for input(s): HGBA1C in  the last 72 hours. CBG: No results for input(s): GLUCAP in the last 168 hours.  Recent Results (from the past 240 hour(s))  Resp Panel by RT-PCR (Flu A&B, Covid) Nasopharyngeal Swab     Status: None   Collection Time: 06/11/21  5:25 AM   Specimen: Nasopharyngeal Swab; Nasopharyngeal(NP) swabs in vial transport medium  Result Value Ref Range Status   SARS Coronavirus 2 by RT PCR NEGATIVE NEGATIVE Final    Comment: (NOTE) SARS-CoV-2 target nucleic acids are NOT DETECTED.  The SARS-CoV-2 RNA is generally detectable in upper respiratory specimens during the acute phase of infection. The lowest concentration of SARS-CoV-2 viral copies this assay can detect is 138 copies/mL. A negative result does not preclude SARS-Cov-2 infection  and should not be used as the sole basis for treatment or other patient management decisions. A negative result may occur with  improper specimen collection/handling, submission of specimen other than nasopharyngeal swab, presence of viral mutation(s) within the areas targeted by this assay, and inadequate number of viral copies(<138 copies/mL). A negative result must be combined with clinical observations, patient history, and epidemiological information. The expected result is Negative.  Fact Sheet for Patients:  EntrepreneurPulse.com.au  Fact Sheet for Healthcare Providers:  IncredibleEmployment.be  This test is no t yet approved or cleared by the [place] FDA and  has been authorized for detection and/or diagnosis of SARS-CoV-2 by FDA under an Emergency Use Authorization (EUA). This EUA will remain  in effect (meaning this test can be used) for the duration of the COVID-19 declaration under Section 564(b)(1) of the Act, 21 U.S.C.section 360bbb-3(b)(1), unless the authorization is terminated  or revoked sooner.       Influenza A by PCR NEGATIVE NEGATIVE Final   Influenza B by PCR NEGATIVE NEGATIVE Final     Comment: (NOTE) The Xpert Xpress SARS-CoV-2/FLU/RSV plus assay is intended as an aid in the diagnosis of influenza from Nasopharyngeal swab specimens and should not be used as a sole basis for treatment. Nasal washings and aspirates are unacceptable for Xpert Xpress SARS-CoV-2/FLU/RSV testing.  Fact Sheet for Patients: EntrepreneurPulse.com.au  Fact Sheet for Healthcare Providers: IncredibleEmployment.be  This test is not yet approved or cleared by the [place] FDA and has been authorized for detection and/or diagnosis of SARS-CoV-2 by FDA under an Emergency Use Authorization (EUA). This EUA will remain in effect (meaning this test can be used) for the duration of the COVID-19 declaration under Section 564(b)(1) of the Act, 21 U.S.C. section 360bbb-3(b)(1), unless the authorization is terminated or revoked.  Performed at [facility], [place] [address] [place], [place] [address]   Body fluid culture w Gram Stain     Status: None (Preliminary result)   Collection Time: 06/11/21  1:44 PM   Specimen: Pleural Fluid  Result Value Ref Range Status   Specimen Description   Final    PLEURAL Performed at [facility] [address] [place], [place] [address]    Special Requests   Final    NONE Performed at [facility], [place] [address] [place], [place] [address]    Gram Stain   Final    FEW WBC PRESENT, PREDOMINANTLY MONONUCLEAR NO ORGANISMS SEEN    Culture   Final    NO GROWTH 3 DAYS Performed at [facility] [address], [place], [place] [address]    Report Status PENDING  Incomplete      Radiology Studies: DG Chest 1 View  Result Date: 06/14/2021 CLINICAL DATA:  New onset shortness of breath EXAM: PORTABLE CHEST 1 VIEW COMPARISON:  06/11/2021 FINDINGS: Cardiac shadow is enlarged but stable. Aortic calcifications are noted. Recurrent right-sided pleural effusion is  noted. No focal infiltrate is seen. No bony abnormality is noted. IMPRESSION: Recurrent small right-sided pleural effusion. Electronically Signed   By: Inez Catalina M.D.   On: 06/14/2021 02:21     Marzetta Board, MD, PhD [facility]  Between 7 am - 7 pm I am available, please contact me via Amion (for emergencies) or Securechat (non urgent messages)  Between 7 pm - 7 am I am not available, please contact night coverage MD/APP via Amion

## 2021-06-15 ENCOUNTER — Inpatient Hospital Stay (HOSPITAL_COMMUNITY): Payer: Medicare Other

## 2021-06-15 DIAGNOSIS — R7989 Other specified abnormal findings of blood chemistry: Secondary | ICD-10-CM | POA: Diagnosis not present

## 2021-06-15 DIAGNOSIS — R7401 Elevation of levels of liver transaminase levels: Secondary | ICD-10-CM | POA: Diagnosis not present

## 2021-06-15 DIAGNOSIS — I5043 Acute on chronic combined systolic (congestive) and diastolic (congestive) heart failure: Secondary | ICD-10-CM

## 2021-06-15 DIAGNOSIS — N179 Acute kidney failure, unspecified: Secondary | ICD-10-CM | POA: Diagnosis not present

## 2021-06-15 DIAGNOSIS — I38 Endocarditis, valve unspecified: Secondary | ICD-10-CM

## 2021-06-15 DIAGNOSIS — I2699 Other pulmonary embolism without acute cor pulmonale: Secondary | ICD-10-CM | POA: Diagnosis not present

## 2021-06-15 LAB — COMPREHENSIVE METABOLIC PANEL
ALT: 2964 U/L — ABNORMAL HIGH (ref 0–44)
AST: 3936 U/L — ABNORMAL HIGH (ref 15–41)
Albumin: 3.8 g/dL (ref 3.5–5.0)
Alkaline Phosphatase: 55 U/L (ref 38–126)
Anion gap: 17 — ABNORMAL HIGH (ref 5–15)
BUN: 69 mg/dL — ABNORMAL HIGH (ref 8–23)
CO2: 22 mmol/L (ref 22–32)
Calcium: 9.3 mg/dL (ref 8.9–10.3)
Chloride: 90 mmol/L — ABNORMAL LOW (ref 98–111)
Creatinine, Ser: 2.07 mg/dL — ABNORMAL HIGH (ref 0.61–1.24)
GFR, Estimated: 34 mL/min — ABNORMAL LOW (ref >60)
Glucose, Bld: 119 mg/dL — ABNORMAL HIGH (ref 70–99)
Potassium: 4.5 mmol/L (ref 3.5–5.1)
Sodium: 129 mmol/L — ABNORMAL LOW (ref 135–145)
Total Bilirubin: 0.8 mg/dL (ref 0.3–1.2)
Total Protein: 7 g/dL (ref 6.5–8.1)

## 2021-06-15 LAB — BODY FLUID CULTURE W GRAM STAIN: Culture: NO GROWTH

## 2021-06-15 LAB — CBC
HCT: 42.2 % (ref 39.0–52.0)
Hemoglobin: 13.7 g/dL (ref 13.0–17.0)
MCH: 27.4 pg (ref 26.0–34.0)
MCHC: 32.5 g/dL (ref 30.0–36.0)
MCV: 84.4 fL (ref 80.0–100.0)
Platelets: 147 10*3/uL — ABNORMAL LOW (ref 150–400)
RBC: 5 MIL/uL (ref 4.22–5.81)
RDW: 14.8 % (ref 11.5–15.5)
WBC: 11.9 10*3/uL — ABNORMAL HIGH (ref 4.0–10.5)
nRBC: 0.8 % — ABNORMAL HIGH (ref 0.0–0.2)

## 2021-06-15 LAB — PROTIME-INR
INR: 10.3 (ref 0.8–1.2)
Prothrombin Time: 81.4 seconds — ABNORMAL HIGH (ref 11.4–15.2)

## 2021-06-15 LAB — CYTOLOGY - NON PAP

## 2021-06-15 LAB — MAGNESIUM: Magnesium: 3 mg/dL — ABNORMAL HIGH (ref 1.7–2.4)

## 2021-06-15 MED ORDER — FUROSEMIDE 10 MG/ML IJ SOLN
80.0000 mg | Freq: Once | INTRAMUSCULAR | Status: AC
  Administered 2021-06-15: 80 mg via INTRAVENOUS
  Filled 2021-06-15: qty 8

## 2021-06-15 MED ORDER — ISOSORBIDE MONONITRATE ER 30 MG PO TB24
30.0000 mg | ORAL_TABLET | Freq: Every day | ORAL | Status: DC
  Administered 2021-06-15 – 2021-06-16 (×2): 30 mg via ORAL
  Filled 2021-06-15 (×2): qty 1

## 2021-06-15 MED ORDER — ASPIRIN 81 MG PO CHEW
81.0000 mg | CHEWABLE_TABLET | ORAL | Status: AC
  Administered 2021-06-16: 81 mg via ORAL
  Filled 2021-06-15: qty 1

## 2021-06-15 NOTE — Progress Notes (Signed)
Lab called to inform this RN of critical INR of 10.3 resulted at 0831. This RN and MD already aware and discussed at morning round.

## 2021-06-15 NOTE — Progress Notes (Signed)
PROGRESS NOTE  Collin Young POE:[id number] DOB: November 08, 1950 DOA: 06/11/2021 PCP: Darreld Mclean, MD   LOS: 4 days   Brief Narrative / Interim history: 70 year old male with history of combined systolic diastolic CHF, esophageal cancer, prostate cancer, COPD, HLD, CAD, chronic kidney disease stage IIIa comes to the hospital with dyspnea.  Symptoms started the day prior to admission, not relieved by his home inhalers and eventually ended up requiring to come to the hospital.  In the ED CT angiogram showed pulmonary embolism lobar to segmental in the right lower lobe as well as large right and small left pleural effusion along with mild interstitial edema.  Pulmonary was consulted and he is status post thoracentesis on 10/12  Subjective / 24h Interval events: Continues to complain of shortness of breath.  No abdominal pain, no nausea or vomiting this morning  Assessment & Plan: Principal Problem Acute pulmonary embolism-patient was maintained on heparin infusion in the first day, no bleeding and seems to be tolerating well.  I discussed with him the risks/benefits for oral anticoagulation, patient prefers oral anticoagulant that can be crushed, he was transitioned to Eliquis on 10/13, continues to tolerate anticoagulation well without evidence of bleeding -INR is elevated today but hard to interpret in the setting of being on heparin and now on Eliquis  Active Problems Acute on chronic combined systolic/diastolic CHF-patient had some evidence of fluid overload on admission with large pleural effusion.  He is status post right-sided thoracentesis on admission, transudative -He was initially given IV Lasix which was held due to rising creatinine.  Currently appears again fluid overloaded, creatinine has stabilized and resume IV Lasix per cardiology -Restarted on his metoprolol, continue to hold Entresto due to AKI.   -Currently on hydralazine as well as Imdur started today.  Discussed with  cardiology this morning  Acute kidney injury on chronic kidney disease stage IIIa-Baseline creatinine 1.2-1.4, received few doses of Lasix on admission but also CT with contrast.   -Creatinine higher than baseline but stabilized since yesterday, given fluid overload clinically resume IV Lasix per cardiology  Elevated liver enzymes-his LFTs were normal on admission but not significantly elevated in the 3000 range.  His RV function appeared to be normal on echo and he was never hypotensive, but there is a suspicion that this is heart failure induced.  Right heart cath tomorrow by cards -I have consulted gastroenterology as well to weigh in given severity of LFT elevation, obtain right upper quadrant ultrasound as well as Dopplers given new PE  HLD - continue statin   Chronic bilateral pleural effusions - s/p right-sided thoracentesis 10/12.  Diuresis as above  Sore throat-appeared 10/14, supportive treatment.  Check for strep throat.   Hx of eosphageal CA  Hx of prostate CA -outpatient management  Scheduled Meds:  apixaban  10 mg Oral BID   Followed by   Derrill Memo ON 06/19/2021] apixaban  5 mg Oral BID   furosemide  80 mg Intravenous Once   guaiFENesin  600 mg Oral BID   hydrALAZINE  25 mg Oral Q6H   isosorbide mononitrate  30 mg Oral Daily   metoprolol succinate  50 mg Oral Daily   polyethylene glycol  17 g Oral Daily   Continuous Infusions:   PRN Meds:.albuterol, lidocaine, menthol-cetylpyridinium, nitroGLYCERIN, ondansetron (ZOFRAN) IV, phenol  Diet Orders (From admission, onward)     Start     Ordered   06/11/21 1505  Diet Heart Room service appropriate? Yes; Fluid consistency: Thin; Fluid restriction: 1500  mL Fluid  Diet effective now       Question Answer Comment  Room service appropriate? Yes   Fluid consistency: Thin   Fluid restriction: 1500 mL Fluid      06/11/21 1504            DVT prophylaxis:  apixaban (ELIQUIS) tablet 10 mg  apixaban (ELIQUIS) tablet 5 mg      Code Status: Full Code  Family Communication: no family at bedside   Status is: Inpatient  Remains inpatient appropriate because:Inpatient level of care appropriate due to severity of illness  Dispo: The patient is from: Home              Anticipated d/c is to: Home              Patient currently is not medically stable to d/c.   Difficult to place patient No   Level of care: Telemetry  Consultants:  Pulmonary Cardiology  Procedures:  2D echo  Microbiology  none  Antimicrobials: none    Objective: Vitals:   06/14/21 2038 06/14/21 2253 06/15/21 0455 06/15/21 0644  BP: 121/81 (!) 141/81 136/82   Pulse: 85 91 85   Resp: 18 18 20    Temp: 97.6 F (36.4 C) (!) 97.5 F (36.4 C) 97.7 F (36.5 C)   TempSrc: Axillary Oral Oral   SpO2: 100% 100% 100% 98%  Weight:      Height:        Intake/Output Summary (Last 24 hours) at 06/15/2021 1037 Last data filed at 06/15/2021 0100 Gross per 24 hour  Intake 240 ml  Output 150 ml  Net 90 ml    Filed Weights   06/11/21 0109 06/12/21 0439  Weight: 62 kg 52.7 kg    Examination:  Constitutional: NAD, in bed Eyes: No scleral icterus ENMT: Moist mucous membranes, clear oropharynx Neck: normal, supple Respiratory: Diminished at the bases, no wheezing, faint crackles Cardiovascular: Regular rate and rhythm, no peripheral edema Abdomen: Soft, NT, ND, bowel sounds positive Musculoskeletal: no clubbing / cyanosis.  Skin: No rash seen Neurologic: Nonfocal, equal strength   Data Reviewed: I have independently reviewed following labs and imaging studies  CBC: Recent Labs  Lab 06/11/21 0225 06/12/21 0327 06/13/21 0319 06/14/21 0402 06/15/21 0329  WBC 4.2 8.8 7.0 11.8* 11.9*  NEUTROABS 3.2  --   --   --   --   HGB 13.0 13.1 13.5 15.7 13.7  HCT 41.0 39.0 42.5 50.5 42.2  MCV 85.2 83.3 85.9 87.8 84.4  PLT 207 217 208 203 147*    Basic Metabolic Panel: Recent Labs  Lab 06/11/21 0225 06/12/21 0327  06/13/21 1014 06/14/21 0402 06/15/21 0329  NA 132* 136 131* 133* 129*  K 3.8 4.2 3.9 5.2* 4.5  CL 97* 101 94* 95* 90*  CO2 24 24 25  12* 22  GLUCOSE 136* 113* 164* 90 119*  BUN 28* 32* 41* 53* 69*  CREATININE 1.58* 1.74* 1.90* 2.11* 2.07*  CALCIUM 9.1 9.2 8.9 8.9 9.3  MG  --   --   --   --  3.0*    Liver Function Tests: Recent Labs  Lab 06/11/21 0225 06/12/21 0327 06/15/21 0329  AST 31 22 3,936*  ALT 18 15 2,964*  ALKPHOS 38 34* 55  BILITOT 0.7 0.6 0.8  PROT 7.4 6.7 7.0  ALBUMIN 4.0 3.5 3.8    Coagulation Profile: Recent Labs  Lab 06/11/21 0536 06/15/21 0831  INR 1.2 10.3*    HbA1C: No results for  input(s): HGBA1C in the last 72 hours. CBG: No results for input(s): GLUCAP in the last 168 hours.  Recent Results (from the past 240 hour(s))  Resp Panel by RT-PCR (Flu A&B, Covid) Nasopharyngeal Swab     Status: None   Collection Time: 06/11/21  5:25 AM   Specimen: Nasopharyngeal Swab; Nasopharyngeal(NP) swabs in vial transport medium  Result Value Ref Range Status   SARS Coronavirus 2 by RT PCR NEGATIVE NEGATIVE Final    Comment: (NOTE) SARS-CoV-2 target nucleic acids are NOT DETECTED.  The SARS-CoV-2 RNA is generally detectable in upper respiratory specimens during the acute phase of infection. The lowest concentration of SARS-CoV-2 viral copies this assay can detect is 138 copies/mL. A negative result does not preclude SARS-Cov-2 infection and should not be used as the sole basis for treatment or other patient management decisions. A negative result may occur with  improper specimen collection/handling, submission of specimen other than nasopharyngeal swab, presence of viral mutation(s) within the areas targeted by this assay, and inadequate number of viral copies(<138 copies/mL). A negative result must be combined with clinical observations, patient history, and epidemiological information. The expected result is Negative.  Fact Sheet for Patients:   EntrepreneurPulse.com.au  Fact Sheet for Healthcare Providers:  IncredibleEmployment.be  This test is no t yet approved or cleared by the [place] FDA and  has been authorized for detection and/or diagnosis of SARS-CoV-2 by FDA under an Emergency Use Authorization (EUA). This EUA will remain  in effect (meaning this test can be used) for the duration of the COVID-19 declaration under Section 564(b)(1) of the Act, 21 U.S.C.section 360bbb-3(b)(1), unless the authorization is terminated  or revoked sooner.       Influenza A by PCR NEGATIVE NEGATIVE Final   Influenza B by PCR NEGATIVE NEGATIVE Final    Comment: (NOTE) The Xpert Xpress SARS-CoV-2/FLU/RSV plus assay is intended as an aid in the diagnosis of influenza from Nasopharyngeal swab specimens and should not be used as a sole basis for treatment. Nasal washings and aspirates are unacceptable for Xpert Xpress SARS-CoV-2/FLU/RSV testing.  Fact Sheet for Patients: EntrepreneurPulse.com.au  Fact Sheet for Healthcare Providers: IncredibleEmployment.be  This test is not yet approved or cleared by the [place] FDA and has been authorized for detection and/or diagnosis of SARS-CoV-2 by FDA under an Emergency Use Authorization (EUA). This EUA will remain in effect (meaning this test can be used) for the duration of the COVID-19 declaration under Section 564(b)(1) of the Act, 21 U.S.C. section 360bbb-3(b)(1), unless the authorization is terminated or revoked.  Performed at [facility], [place] [address] [place], [place] [address]   Body fluid culture w Gram Stain     Status: None (Preliminary result)   Collection Time: 06/11/21  1:44 PM   Specimen: Pleural Fluid  Result Value Ref Range Status   Specimen Description   Final    PLEURAL Performed at [place] [address] [place], [place] [address]     Special Requests   Final    NONE Performed at [facility], [place] [address] [place], [place] [address]    Gram Stain   Final    FEW WBC PRESENT, PREDOMINANTLY MONONUCLEAR NO ORGANISMS SEEN    Culture   Final    NO GROWTH 3 DAYS Performed at [place] [address], [place], [place] [address]    Report Status PENDING  Incomplete      Radiology Studies: No results found.  Marzetta Board, MD, PhD [facility]  Between 7 am - 7 pm I am available, please contact me via Amion (for emergencies) or Securechat (non urgent messages)  Between 7 pm - 7 am I am not available, please contact night coverage MD/APP via Amion

## 2021-06-15 NOTE — Progress Notes (Signed)
Progress Note  Patient Name: Collin Young Date of Encounter: 06/15/2021  Primary Cardiologist: Skeet Latch, MD   Subjective   Interval labs notable for significant transaminitis.  Notes his heart pain has improved but his breathing has worsened.  Inpatient Medications    Scheduled Meds:  apixaban  10 mg Oral BID   Followed by   Derrill Memo ON 06/19/2021] apixaban  5 mg Oral BID   guaiFENesin  600 mg Oral BID   hydrALAZINE  25 mg Oral Q6H   metoprolol succinate  50 mg Oral Daily   polyethylene glycol  17 g Oral Daily   Continuous Infusions:  PRN Meds: albuterol, lidocaine, menthol-cetylpyridinium, nitroGLYCERIN, ondansetron (ZOFRAN) IV, phenol   Vital Signs    Vitals:   06/14/21 2038 06/14/21 2253 06/15/21 0455 06/15/21 0644  BP: 121/81 (!) 141/81 136/82   Pulse: 85 91 85   Resp: 18 18 20    Temp: 97.6 F (36.4 C) (!) 97.5 F (36.4 C) 97.7 F (36.5 C)   TempSrc: Axillary Oral Oral   SpO2: 100% 100% 100% 98%  Weight:      Height:        Intake/Output Summary (Last 24 hours) at 06/15/2021 0802 Last data filed at 06/15/2021 0100 Gross per 24 hour  Intake 480 ml  Output 150 ml  Net 330 ml   Filed Weights   06/11/21 0109 06/12/21 0439  Weight: 62 kg 52.7 kg    Telemetry    SR with PVCs no further NSVT- Personally Reviewed  ECG    No new- Personally Reviewed  Physical Exam   GEN: Mild distress.   Neck: notable JVD Cardiac: RRR, no murmurs, rubs, or gallops.  Respiratory: Coarse breath sounds bilalterally GI: Soft, nontender, non-distended  MS: No edema LE; No deformity. Neuro:  Nonfocal  Psych: Normal affect   Labs    Chemistry Recent Labs  Lab 06/11/21 0225 06/12/21 0327 06/13/21 1014 06/14/21 0402 06/15/21 0329  NA 132* 136 131* 133* 129*  K 3.8 4.2 3.9 5.2* 4.5  CL 97* 101 94* 95* 90*  CO2 24 24 25  12* 22  GLUCOSE 136* 113* 164* 90 119*  BUN 28* 32* 41* 53* 69*  CREATININE 1.58* 1.74* 1.90* 2.11* 2.07*  CALCIUM 9.1 9.2 8.9  8.9 9.3  PROT 7.4 6.7  --   --  7.0  ALBUMIN 4.0 3.5  --   --  3.8  AST 31 22  --   --  3,936*  ALT 18 15  --   --  2,964*  ALKPHOS 38 34*  --   --  55  BILITOT 0.7 0.6  --   --  0.8  GFRNONAA 47* 42* 37* 33* 34*  ANIONGAP 11 11 12  26* 17*     Hematology Recent Labs  Lab 06/13/21 0319 06/14/21 0402 06/15/21 0329  WBC 7.0 11.8* 11.9*  RBC 4.95 5.75 5.00  HGB 13.5 15.7 13.7  HCT 42.5 50.5 42.2  MCV 85.9 87.8 84.4  MCH 27.3 27.3 27.4  MCHC 31.8 31.1 32.5  RDW 15.2 15.3 14.8  PLT 208 203 147*    Cardiac EnzymesNo results for input(s): TROPONINI in the last 168 hours. No results for input(s): TROPIPOC in the last 168 hours.   BNP Recent Labs  Lab 06/11/21 0225  BNP >4,500.0*     DDimer No results for input(s): DDIMER in the last 168 hours.   Radiology    DG Chest 1 View  Result Date: 06/14/2021 CLINICAL DATA:  New onset shortness of breath EXAM: PORTABLE CHEST 1 VIEW COMPARISON:  06/11/2021 FINDINGS: Cardiac shadow is enlarged but stable. Aortic calcifications are noted. Recurrent right-sided pleural effusion is noted. No focal infiltrate is seen. No bony abnormality is noted. IMPRESSION: Recurrent small right-sided pleural effusion. Electronically Signed   By: Inez Catalina M.D.   On: 06/14/2021 02:21    Cardiac Studies    Transthoracic Echocardiogram: Date: 06/11/21 Results: EF is severely reduced  1. Limited study no complete doppler/color flow. EF 20% global  hypokinesis RV normal size and function Moderate LAE, mild RAE. IVC  dilated Moderate TR mild AR with AV sclerosis ? pleural effusion no  pericardial effusion.   2. Left ventricular ejection fraction, by estimation, is 20%. The left  ventricle has severely decreased function. The left ventricle has no  regional wall motion abnormalities. The left ventricular internal cavity  size was severely dilated. Left  ventricular diastolic function could not be evaluated.   3. Right ventricular systolic function  is normal. The right ventricular  size is normal. There is mildly elevated pulmonary artery systolic  pressure.   4. Left atrial size was moderately dilated.   5. Right atrial size was mildly dilated.   6. Moderate pleural effusion in the left lateral region.   7. The mitral valve was not assessed. No evidence of mitral valve  regurgitation. No evidence of mitral stenosis.   8. Tricuspid valve regurgitation is moderate.   9. The aortic valve is abnormal. Aortic valve regurgitation is mild. Mild  to moderate aortic valve sclerosis/calcification is present, without any  evidence of aortic stenosis.  10. The inferior vena cava is dilated in size with >50% respiratory  variability, suggesting right atrial pressure of 8 mmHg.    Patient Profile     70 y.o. 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 and esophagus s/p chemo and XRT in 2010, CKD stage III A and COPD found to have new PE in the setting of SOB  Assessment & Plan    AKI on CKD Stage III  HFrEF Mild to Moderate AI Transaminitis NSVT - holding SGLT2i, Entresto, given SOB will give Lasix 80 IV X1 despite stable kidney fx - hydralazine 25 mg O TID - added Imdur 30 mg PO Daily for afterload reduction and for heart pains  - metoprolol succinate 50 mg PO daily  - I have some concerns there is a low flow state component of patients symptoms, he does not have hypotension but has had hepatic and kidney dysfunction - we will pursue a RHC 06/16/21; potentially with HF team, for evaluation of low flow state and need of temporary inotropes;  - Consented patient and daugther [name], orders are signed and held, discussed risks and benefits, discussed with primary team  Acute PE Hx of CAD - on eliquis - LDL managed with repatha and prasvastatin 80 mg PO daily - was not deemed interventional candidate in  2018; I am unclear about the full details of this  For questions or updates, please contact [place] Please consult [email or web address] for contact info under Cardiology/STEMI.      Signed, Werner Lean, MD  06/15/2021, 8:02 AM

## 2021-06-15 NOTE — Consult Note (Signed)
Consultation  Referring Provider: Dr. Cruzita Lederer     Primary Care Physician:  Darreld Mclean, MD Primary Gastroenterologist: Dr. Ardis Hughs Reason for Consultation: Elevated LFTs             HPI:   Collin Young is a 70 y.o. male with a past medical history as listed below including significant combined systolic/diastolic heart failure, hepatitis C previously treated and cured, esophageal cancer, prostate cancer, COPD, CAD, CKD stage III and multiple others, who presented to the hospital initially on 06/11/2021 with dyspnea.  We are consulted now in regards to an elevation in LFTs since hospitalization.    At time of arrival patient described dyspnea which has started the morning before requiring extra dosing of his inhalers.  In the ER CTA showed right lower lobe PE and he was started on a heparin drip, initially started on Rocephin for presumed PNA, BNP was found to be greater than 4500 and he was admitted.    Today, the patient tells me he is aware of his history of hepatitis C but tells me this was treated and cured.  He has never had any other liver troubles that he is aware of.  Denies alcohol use at all.  Tells me really he has been improving every day he has been here.    Denies fever, chills, weight loss, change in bowel habits, nausea, vomiting, heartburn, reflux, family history of liver disease or IV drug use.  GI history: 04/19/2020 office visit with Dr. Ardis Hughs for diarrhea.  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 Chronic combined systolic and diastolic CHF (congestive heart failure) ([place])    followed by [name]  . COPD (chronic obstructive pulmonary disease) ([place])   . Coronary artery disease cardiologist-- [name]   per cardiac cath 01-05-2017  chronic total occlusion pLCx with collaterals, 99% severe calcified prox. to mid RCA, otherwise mild to moderate CAD (medically managed)  . Esophageal cancer, stage IIIB [facility]) oncologist-  dr ennever/  dr moody   dx 2010  SCC Stage IIIB completed chemoradiation;  localized recurrent left piriform sinus 01/ 2015,  completed concurrent chemoradiatoin 04/ 2015  . GERD (gastroesophageal reflux disease)    09-07-2018   no issues since Gtube removed 06/ 2019  . History of alcohol abuse    quit 2001  . History of cancer chemotherapy    2010;   2015  . History of radiation therapy    10-19-2013 to  12-05-2013 pyriform sinus 69.96 Gy/56f;   Radiation completed 2010 for esophageal cancer  . History of seizure 2001   alcohol withdrawal  . HOH (hard of hearing)   . Hyperlipidemia   . Hypertension   . Ischemic cardiomyopathy 12/2016   01-03-2017  echo,  ef 10-15%/   echo 05-06-2017 EF improved to 45-50%  . Prostate cancer ([facility] urologist-  dr ottelin/  oncologist-- dr mTammi Klippel  dx 05-27-2018--- Stage T1c,  Gleason 4+3,  PSA 9.3--  scheduled for brachytherapy 09-23-2017  . Renal cyst, left     Past Surgical History:  Procedure Laterality Date  . CARDIOVASCULAR STRESS TEST  10/09/09   normal nuclearr stress test, EF 57% [name])  . IR GASTROSTOMY TUBE MOD SED  01/13/2017  . IR GASTROSTOMY TUBE REMOVAL  02/03/2018  . IR PATIENT EVAL TECH 0-60 MINS  03/25/2017  . IR REMOVAL TUN ACCESS W/ PORT W/O FL MOD SED  01/15/2017  . IR REPLACE G-TUBE SIMPLE WO FLUORO  01/13/2018  . LARYNGOSCOPY N/A 09/15/2013   Procedure: LARYNGOSCOPY;  Surgeon: [name], MD;  Location:  [facility];  Service: ENT;  Laterality: N/A;  direct laryngoscopy with biopsy and esophagoscopy  . RADIOACTIVE SEED IMPLANT N/A 09/23/2018   Procedure: RADIOACTIVE SEED IMPLANT/BRACHYTHERAPY IMPLANT;  Surgeon: [name], MD;  Location: [facility];  Service: Urology;  Laterality: N/A;  . RIGHT/LEFT HEART CATH AND CORONARY ANGIOGRAPHY N/A 01/05/2017   Procedure: Right/Left Heart Cath and Coronary Angiography;  Surgeon: [name], MD;  Location: [place] CV LAB;  Service: Cardiovascular;  Laterality: N/A;  . TRANSTHORACIC ECHOCARDIOGRAM  05/06/2017   ef 45-50%,  grade 1 diastolic dysfunction/  AV severe calcified non coronary cusp with moderate regurg. , no stenosis (valve area per echo 01-05-2017 1.08cm^2)/  mild MR, TR, and PR/  mild LAE    Family History  Problem Relation Age of Onset  . Breast cancer Mother   . Prostate cancer Neg Hx   . Colon cancer Neg Hx   . Pancreatic cancer Neg Hx     Social History   Tobacco Use  . Smoking status: Former    Packs/day: 1.00    Years: 40.00    Pack years: 40.00    Types: Cigarettes    Start date: 11/08/1960    Quit date: 08/31/2009    Years since quitting: 11.7  . Smokeless tobacco: Former    Types: Chew    Quit date: 09/01/1999  Vaping Use  . Vaping Use: Never used  Substance Use Topics  . Alcohol use: Not Currently    Alcohol/week: 0.0 standard drinks    Comment: quit in 2001  . Drug use: No    Comment: back in the day used cocaine,alcohol, marijuana    Prior to Admission medications   Medication Sig Start Date End Date Taking? Authorizing Provider  albuterol (VENTOLIN HFA) 108 (90 Base) MCG/ACT inhaler INHALE 2 PUFFS INTO THE LUNGS EVERY 6 HOURS AS NEEDED FOR WHEEZING OR SHORTNESS OF BREATH Patient taking differently: Inhale 2 puffs into the lungs every 6 (six) hours as needed for wheezing or shortness of breath. 03/28/21  Yes Copland, Gay Filler, MD  BAYER LOW DOSE 81 MG EC tablet Take 81 mg by mouth daily.  Swallow whole.   Yes [provider]  dapagliflozin propanediol (FARXIGA) 10 MG TABS tablet Take 1 tablet (10 mg total) by mouth daily. 05/12/21  Yes Georgette Shell, MD  ENTRESTO 24-26 MG TAKE 1 TABLET BY MOUTH TWICE DAILY Patient taking differently: Take 0.5 tablets by mouth 2 (two) times daily. 11/11/20  Yes Skeet Latch, MD  Evolocumab (REPATHA SURECLICK) 627 MG/ML SOAJ Inject 140 mg into the skin every 14 (fourteen) days. 11/14/20  Yes Skeet Latch, MD  fentaNYL (DURAGESIC) 12 MCG/HR Place 1 patch onto the skin every 3 (three) days. 06/04/21  Yes [name], MD  furosemide (LASIX) 40 MG tablet Take 40 mg of Lasix if you gain weight more  than 2 pounds per day or more than 5 pounds per week Patient taking differently: Take 40 mg by mouth every Monday, Wednesday, and Friday. 05/11/21  Yes Georgette Shell, MD  Multiple Vitamin (MULTIVITAMIN) tablet Take 1 tablet by mouth 3 (three) times a week.   Yes [provider]  polyethylene glycol (MIRALAX / GLYCOLAX) 17 g packet Take 17 g by mouth daily as needed for mild constipation. Patient taking differently: Take 17 g by mouth daily as needed for mild constipation (MIX AND DRINK AS DIRECTED). 05/11/21  Yes Georgette Shell, MD  pravastatin (PRAVACHOL) 80 MG tablet Take 1 tablet (80 mg total) by mouth every evening. 04/30/21 07/29/21 Yes Skeet Latch, MD  metoprolol succinate (TOPROL-XL) 25 MG 24 hr tablet Take 0.5 tablets (12.5 mg total) by mouth daily. Patient not taking: Reported on 06/11/2021 05/12/21   Georgette Shell, MD    Current Facility-Administered Medications  Medication Dose Route Frequency Provider Last Rate Last Admin  . albuterol (PROVENTIL) (2.5 MG/3ML) 0.083% nebulizer solution 2.5 mg  2.5 mg Nebulization Q4H PRN Caren Griffins, MD   2.5 mg at 06/15/21 0643  . apixaban (ELIQUIS) tablet 10 mg  10 mg Oral BID Dimple Nanas, RPH   10 mg at 06/14/21 2254   Followed by  . [START  ON 06/19/2021] apixaban (ELIQUIS) tablet 5 mg  5 mg Oral BID Dimple Nanas, RPH      . furosemide (LASIX) injection 80 mg  80 mg Intravenous Once Chandrasekhar, Mahesh A, MD      . guaiFENesin (MUCINEX) 12 hr tablet 600 mg  600 mg Oral BID Marylyn Ishihara, Tyrone A, DO   600 mg at 06/14/21 2254  . hydrALAZINE (APRESOLINE) tablet 25 mg  25 mg Oral Q6H Chandrasekhar, Mahesh A, MD   25 mg at 06/15/21 0637  . isosorbide mononitrate (IMDUR) 24 hr tablet 30 mg  30 mg Oral Daily Chandrasekhar, Mahesh A, MD      . lidocaine (XYLOCAINE) 2 % viscous mouth solution 15 mL  15 mL Mouth/Throat Q4H PRN Caren Griffins, MD      . menthol-cetylpyridinium (CEPACOL) lozenge 3 mg  1 lozenge Oral PRN Caren Griffins, MD   3 mg at 06/13/21 1939  . metoprolol succinate (TOPROL-XL) 24 hr tablet 50 mg  50 mg Oral Daily Chandrasekhar, Mahesh A, MD      . nitroGLYCERIN (NITROSTAT) SL tablet 0.4 mg  0.4 mg Sublingual Q5 Min x 3 PRN Chandrasekhar, Mahesh A, MD      . ondansetron (ZOFRAN) injection 4 mg  4 mg Intravenous Q8H PRN Blount, Scarlette Shorts T, NP   4 mg at 06/13/21 1938  . phenol (CHLORASEPTIC) mouth spray 1 spray  1 spray Mouth/Throat PRN Marylyn Ishihara, Tyrone A, DO   1 spray at 06/11/21 1944  . polyethylene glycol (MIRALAX / GLYCOLAX) packet 17 g  17 g Oral Daily Caren Griffins, MD   17 g at 06/14/21 2836   Facility-Administered Medications Ordered in Other Encounters  Medication Dose Route Frequency Provider Last Rate Last Admin  . topical emolient (BIAFINE) emulsion   Topical Daily Kyung Rudd, MD   Given at 01/19/14 0912    Allergies as of 06/11/2021  . (No Known Allergies)     Review of Systems:    Constitutional: No weight loss, fever or chills Skin: No rash  Cardiovascular: No chest pain  Respiratory: +SOB Gastrointestinal: See HPI and otherwise negative Genitourinary: No dysuria  Neurological: No headache, dizziness or syncope Musculoskeletal:  No new muscle or joint pain Hematologic: No bleeding  Psychiatric:  No history of depression or anxiety    Physical Exam:  Vital signs in last 24 hours: Temp:  [97.4 F (36.3 C)-97.7 F (36.5 C)] 97.7 F (36.5 C) (10/16 0455) Pulse Rate:  [85-92] 85 (10/16 0455) Resp:  [18-20] 20 (10/16 0455) BP: (121-141)/(81-85) 136/82 (10/16 0455) SpO2:  [98 %-100 %] 98 % (10/16 0644) FiO2 (%):  [28 %] 28 % (10/15 1706) Last BM Date: 06/13/21 General:   Pleasant AA male appears to be in NAD, Well developed, Well nourished, alert and cooperative Head:  Normocephalic and atraumatic. Eyes:   PEERL, EOMI. No icterus. Conjunctiva pink. Ears:  +HOH with hearing aides in place Neck:  Supple Throat: Oral cavity and pharynx without inflammation, swelling or lesion.  Lungs: Respirations even and unlabored. Lungs clear to auscultation bilaterally.   No wheezes, crackles, or rhonchi.  Heart: Normal S1, S2. No MRG. Regular rate and rhythm.  Abdomen:  Soft, nondistended, nontender. No rebound or guarding. Normal bowel sounds. No appreciable masses or hepatomegaly. Rectal:  Not performed.  Msk:  Symmetrical without gross deformities. Peripheral pulses intact.  Extremities:  Without edema, no deformity or joint abnormality.  Neurologic:  Alert and  oriented x4;  grossly normal neurologically. Skin:   Dry and intact without significant lesions or rashes. Psychiatric: Demonstrates good judgement and reason without abnormal affect or behaviors.   LAB RESULTS: Recent Labs    06/13/21 0319 06/14/21 0402 06/15/21 0329  WBC 7.0 11.8* 11.9*  HGB 13.5 15.7 13.7  HCT 42.5 50.5 42.2  PLT 208 203 147*   BMET Recent Labs    06/13/21 1014 06/14/21 0402 06/15/21 0329  NA 131* 133* 129*  K 3.9 5.2* 4.5  CL 94* 95* 90*  CO2 25 12* 22  GLUCOSE 164* 90 119*  BUN 41* 53* 69*  CREATININE 1.90* 2.11* 2.07*  CALCIUM 8.9 8.9 9.3   LFT Recent Labs    06/15/21 0329  PROT 7.0  ALBUMIN 3.8  AST 3,936*  ALT 2,964*  ALKPHOS 55  BILITOT 0.8   PT/INR Recent Labs     06/15/21 0831  LABPROT 81.4*  INR 10.3*    STUDIES: DG Chest 1 View  Result Date: 06/14/2021 CLINICAL DATA:  New onset shortness of breath EXAM: PORTABLE CHEST 1 VIEW COMPARISON:  06/11/2021 FINDINGS: Cardiac shadow is enlarged but stable. Aortic calcifications are noted. Recurrent right-sided pleural effusion is noted. No focal infiltrate is seen. No bony abnormality is noted. IMPRESSION: Recurrent small right-sided pleural effusion. Electronically Signed   By: Inez Catalina M.D.   On: 06/14/2021 02:21      Impression / Plan:  Impression: 1.  Elevated LFTs: Dramatic increase in LFTs which were normal on 06/12/2021 and this morning AST 3936, ALT 2964, alk phos and total bilirubin normal, INR 10.3 (normal on 06/11/2021), suspicion that this is heart failure induced and cardiology plans on right heart cath tomorrow; most likely ischemic versus much less likely hydralazine effect 2.  Acute pulmonary embolism: With dyspnea at admission, now after heparin drip and transition to Eliquis on 06/12/2021 3.  CKD stage III 4.  Acute on chronic combined systolic/diastolic heart failure with elevated troponins: 06/11/2021 echo with an EF of 20% and global hypokinesis with right ventricle normal size and function 5.  Chronic bilateral pleural effusions: Status post right-sided thoracentesis 10/12 and on diuresis 6.  History of esophageal and prostate cancer 7.  History of hepatitis C: Previously treated  and cured earlier this year  Plan: 1.  Right upper quadrant ultrasound liver Doppler is normal from this morning which is reassuring 2.  Agree with cardiology assessment with right heart cath tomorrow 3.  Continue to monitor LFTs daily 4.  Continue other supportive measures.  Thank you for your kind consultation, we will continue to follow.  Lavone Nian Emillie Chasen  06/15/2021, 10:54 AM

## 2021-06-16 ENCOUNTER — Encounter (HOSPITAL_COMMUNITY)
Admission: EM | Disposition: A | Discharge status: Home / Self Care | Payer: Self-pay | Source: Home / Self Care | Attending: Critical Care Medicine

## 2021-06-16 ENCOUNTER — Encounter (HOSPITAL_COMMUNITY): Payer: Self-pay | Admitting: Internal Medicine

## 2021-06-16 DIAGNOSIS — K7200 Acute and subacute hepatic failure without coma: Secondary | ICD-10-CM | POA: Diagnosis not present

## 2021-06-16 DIAGNOSIS — I5043 Acute on chronic combined systolic (congestive) and diastolic (congestive) heart failure: Secondary | ICD-10-CM | POA: Diagnosis not present

## 2021-06-16 DIAGNOSIS — R7989 Other specified abnormal findings of blood chemistry: Secondary | ICD-10-CM | POA: Diagnosis not present

## 2021-06-16 DIAGNOSIS — I2601 Septic pulmonary embolism with acute cor pulmonale: Secondary | ICD-10-CM | POA: Diagnosis not present

## 2021-06-16 DIAGNOSIS — I50811 Acute right heart failure: Secondary | ICD-10-CM

## 2021-06-16 DIAGNOSIS — N179 Acute kidney failure, unspecified: Secondary | ICD-10-CM | POA: Diagnosis not present

## 2021-06-16 DIAGNOSIS — I5021 Acute systolic (congestive) heart failure: Secondary | ICD-10-CM | POA: Diagnosis not present

## 2021-06-16 DIAGNOSIS — R7401 Elevation of levels of liver transaminase levels: Secondary | ICD-10-CM | POA: Diagnosis not present

## 2021-06-16 DIAGNOSIS — R570 Cardiogenic shock: Secondary | ICD-10-CM

## 2021-06-16 DIAGNOSIS — I2699 Other pulmonary embolism without acute cor pulmonale: Secondary | ICD-10-CM | POA: Diagnosis not present

## 2021-06-16 HISTORY — PX: RIGHT HEART CATH: CATH118263

## 2021-06-16 HISTORY — PX: CENTRAL LINE INSERTION: CATH118232

## 2021-06-16 LAB — POCT I-STAT EG7
Acid-Base Excess: 5 mmol/L — ABNORMAL HIGH (ref 0.0–2.0)
Acid-Base Excess: 7 mmol/L — ABNORMAL HIGH (ref 0.0–2.0)
Bicarbonate: 30.7 mmol/L — ABNORMAL HIGH (ref 20.0–28.0)
Bicarbonate: 32.1 mmol/L — ABNORMAL HIGH (ref 20.0–28.0)
Calcium, Ion: 1.07 mmol/L — ABNORMAL LOW (ref 1.15–1.40)
Calcium, Ion: 1.08 mmol/L — ABNORMAL LOW (ref 1.15–1.40)
HCT: 39 % (ref 39.0–52.0)
HCT: 39 % (ref 39.0–52.0)
Hemoglobin: 13.3 g/dL (ref 13.0–17.0)
Hemoglobin: 13.3 g/dL (ref 13.0–17.0)
O2 Saturation: 56 %
O2 Saturation: 57 %
Potassium: 3.9 mmol/L (ref 3.5–5.1)
Potassium: 4.2 mmol/L (ref 3.5–5.1)
Sodium: 133 mmol/L — ABNORMAL LOW (ref 135–145)
Sodium: 135 mmol/L (ref 135–145)
TCO2: 32 mmol/L (ref 22–32)
TCO2: 34 mmol/L — ABNORMAL HIGH (ref 22–32)
pCO2, Ven: 47 mmHg (ref 44.0–60.0)
pCO2, Ven: 48.8 mmHg (ref 44.0–60.0)
pH, Ven: 7.423 (ref 7.250–7.430)
pH, Ven: 7.426 (ref 7.250–7.430)
pO2, Ven: 29 mmHg — CL (ref 32.0–45.0)
pO2, Ven: 30 mmHg — CL (ref 32.0–45.0)

## 2021-06-16 LAB — COMPREHENSIVE METABOLIC PANEL
ALT: 3809 U/L — ABNORMAL HIGH (ref 0–44)
AST: 4265 U/L — ABNORMAL HIGH (ref 15–41)
Albumin: 3.8 g/dL (ref 3.5–5.0)
Alkaline Phosphatase: 54 U/L (ref 38–126)
Anion gap: 13 (ref 5–15)
BUN: 80 mg/dL — ABNORMAL HIGH (ref 8–23)
CO2: 26 mmol/L (ref 22–32)
Calcium: 9.3 mg/dL (ref 8.9–10.3)
Chloride: 92 mmol/L — ABNORMAL LOW (ref 98–111)
Creatinine, Ser: 1.98 mg/dL — ABNORMAL HIGH (ref 0.61–1.24)
GFR, Estimated: 36 mL/min — ABNORMAL LOW (ref >60)
Glucose, Bld: 102 mg/dL — ABNORMAL HIGH (ref 70–99)
Potassium: 4.7 mmol/L (ref 3.5–5.1)
Sodium: 131 mmol/L — ABNORMAL LOW (ref 135–145)
Total Bilirubin: 1.5 mg/dL — ABNORMAL HIGH (ref 0.3–1.2)
Total Protein: 6.8 g/dL (ref 6.5–8.1)

## 2021-06-16 LAB — COOXEMETRY PANEL
Carboxyhemoglobin: 1.2 % (ref 0.5–1.5)
Methemoglobin: 0.6 % (ref 0.0–1.5)
O2 Saturation: 49.6 %
Total hemoglobin: 11.9 g/dL — ABNORMAL LOW (ref 12.0–16.0)

## 2021-06-16 LAB — APTT: aPTT: 32 seconds (ref 24–36)

## 2021-06-16 LAB — MAGNESIUM: Magnesium: 3 mg/dL — ABNORMAL HIGH (ref 1.7–2.4)

## 2021-06-16 LAB — CHOLESTEROL, BODY FLUID: Cholesterol, Fluid: 12 mg/dL

## 2021-06-16 LAB — HEPATITIS PANEL, ACUTE
HCV Ab: REACTIVE — AB
Hep A IgM: NONREACTIVE
Hep B C IgM: NONREACTIVE
Hepatitis B Surface Ag: NONREACTIVE

## 2021-06-16 LAB — CBC
HCT: 38.2 % — ABNORMAL LOW (ref 39.0–52.0)
Hemoglobin: 12.5 g/dL — ABNORMAL LOW (ref 13.0–17.0)
MCH: 27.4 pg (ref 26.0–34.0)
MCHC: 32.7 g/dL (ref 30.0–36.0)
MCV: 83.6 fL (ref 80.0–100.0)
Platelets: 83 10*3/uL — ABNORMAL LOW (ref 150–400)
RBC: 4.57 MIL/uL (ref 4.22–5.81)
RDW: 14.7 % (ref 11.5–15.5)
WBC: 9.2 10*3/uL (ref 4.0–10.5)
nRBC: 2 % — ABNORMAL HIGH (ref 0.0–0.2)

## 2021-06-16 LAB — LACTATE DEHYDROGENASE: LDH: 3016 U/L — ABNORMAL HIGH (ref 98–192)

## 2021-06-16 LAB — PROTIME-INR
INR: 5.8 (ref 0.8–1.2)
Prothrombin Time: 51.9 seconds — ABNORMAL HIGH (ref 11.4–15.2)

## 2021-06-16 LAB — MRSA NEXT GEN BY PCR, NASAL: MRSA by PCR Next Gen: NOT DETECTED

## 2021-06-16 SURGERY — RIGHT HEART CATH

## 2021-06-16 MED ORDER — MIDAZOLAM HCL 2 MG/2ML IJ SOLN
INTRAMUSCULAR | Status: DC | PRN
  Administered 2021-06-16: 1 mg via INTRAVENOUS

## 2021-06-16 MED ORDER — CHLORHEXIDINE GLUCONATE CLOTH 2 % EX PADS
6.0000 | MEDICATED_PAD | Freq: Every day | CUTANEOUS | Status: DC
  Administered 2021-06-16 – 2021-06-23 (×9): 6 via TOPICAL

## 2021-06-16 MED ORDER — FENTANYL CITRATE (PF) 100 MCG/2ML IJ SOLN
INTRAMUSCULAR | Status: DC | PRN
  Administered 2021-06-16: 25 ug via INTRAVENOUS

## 2021-06-16 MED ORDER — SODIUM CHLORIDE 0.9 % IV SOLN
INTRAVENOUS | Status: DC
Start: 2021-06-17 — End: 2021-06-16

## 2021-06-16 MED ORDER — MILRINONE LACTATE IN DEXTROSE 20-5 MG/100ML-% IV SOLN
0.2500 ug/kg/min | INTRAVENOUS | Status: DC
  Filled 2021-06-16: qty 100

## 2021-06-16 MED ORDER — LIDOCAINE HCL (PF) 1 % IJ SOLN
INTRAMUSCULAR | Status: DC | PRN
  Administered 2021-06-16 (×2): 2 mL

## 2021-06-16 MED ORDER — HEPARIN (PORCINE) 25000 UT/250ML-% IV SOLN
1000.0000 [IU]/h | INTRAVENOUS | Status: DC
  Administered 2021-06-16: 900 [IU]/h via INTRAVENOUS
  Administered 2021-06-18 – 2021-06-19 (×3): 1000 [IU]/h via INTRAVENOUS
  Filled 2021-06-16 (×4): qty 250

## 2021-06-16 MED ORDER — VITAMIN K1 10 MG/ML IJ SOLN
10.0000 mg | Freq: Once | INTRAVENOUS | Status: DC
  Filled 2021-06-16: qty 1

## 2021-06-16 MED ORDER — SODIUM CHLORIDE 0.9% FLUSH: 3.0000 mL | INTRAVENOUS | Status: DC | PRN

## 2021-06-16 MED ORDER — HEPARIN (PORCINE) IN NACL 1000-0.9 UT/500ML-% IV SOLN
INTRAVENOUS | Status: DC | PRN
  Administered 2021-06-16: 500 mL

## 2021-06-16 MED ORDER — SODIUM CHLORIDE 0.9 % IV SOLN
2.0000 g | INTRAVENOUS | Status: DC
  Administered 2021-06-16 – 2021-06-17 (×2): 2 g via INTRAVENOUS
  Filled 2021-06-16 (×2): qty 2

## 2021-06-16 MED ORDER — MILRINONE LACTATE IN DEXTROSE 20-5 MG/100ML-% IV SOLN
0.1250 ug/kg/min | INTRAVENOUS | Status: DC
  Administered 2021-06-16: 0.25 ug/kg/min via INTRAVENOUS
  Administered 2021-06-18: 0.5 ug/kg/min via INTRAVENOUS
  Administered 2021-06-19: 0.375 ug/kg/min via INTRAVENOUS
  Administered 2021-06-19 (×2): 0.25 ug/kg/min via INTRAVENOUS
  Administered 2021-06-21: 0.125 ug/kg/min via INTRAVENOUS
  Filled 2021-06-16 (×9): qty 100

## 2021-06-16 MED ORDER — NOREPINEPHRINE 4 MG/250ML-% IV SOLN
0.0000 ug/min | INTRAVENOUS | Status: DC
  Filled 2021-06-16: qty 250

## 2021-06-16 MED ORDER — HEPARIN (PORCINE) IN NACL 1000-0.9 UT/500ML-% IV SOLN
INTRAVENOUS | Status: AC
  Filled 2021-06-16: qty 500

## 2021-06-16 MED ORDER — ACETAMINOPHEN 160 MG/5ML PO SOLN
650.0000 mg | Freq: Four times a day (QID) | ORAL | Status: DC | PRN
  Administered 2021-06-16 – 2021-06-18 (×4): 650 mg via ORAL
  Filled 2021-06-16 (×5): qty 20.3

## 2021-06-16 MED ORDER — ACETAMINOPHEN 160 MG/5ML PO SOLN: 650.0000 mg | Freq: Four times a day (QID) | ORAL | Status: DC | PRN

## 2021-06-16 MED ORDER — FENTANYL CITRATE (PF) 100 MCG/2ML IJ SOLN
INTRAMUSCULAR | Status: AC
  Filled 2021-06-16: qty 2

## 2021-06-16 MED ORDER — NYSTATIN 100000 UNIT/ML MT SUSP
5.0000 mL | Freq: Four times a day (QID) | OROMUCOSAL | Status: DC
  Administered 2021-06-16 – 2021-06-17 (×3): 500000 [IU] via ORAL
  Filled 2021-06-16 (×3): qty 5

## 2021-06-16 MED ORDER — LIDOCAINE HCL (PF) 1 % IJ SOLN
INTRAMUSCULAR | Status: AC
  Filled 2021-06-16: qty 30

## 2021-06-16 MED ORDER — SODIUM CHLORIDE 0.9 % IV SOLN: INTRAVENOUS | Status: DC

## 2021-06-16 MED ORDER — SODIUM CHLORIDE 0.9% FLUSH: 3.0000 mL | Freq: Two times a day (BID) | INTRAVENOUS | Status: DC

## 2021-06-16 MED ORDER — ACETAMINOPHEN 325 MG PO TABS
650.0000 mg | ORAL_TABLET | Freq: Four times a day (QID) | ORAL | Status: DC | PRN
  Filled 2021-06-16: qty 2

## 2021-06-16 MED ORDER — MIDAZOLAM HCL 2 MG/2ML IJ SOLN
INTRAMUSCULAR | Status: AC
  Filled 2021-06-16: qty 2

## 2021-06-16 MED ORDER — SODIUM CHLORIDE 0.9 % IV SOLN: 250.0000 mL | INTRAVENOUS | Status: DC | PRN

## 2021-06-16 SURGICAL SUPPLY — 10 items
CATH SWAN GANZ VIP 7.5F (CATHETERS) ×1 IMPLANT
PACK CARDIAC CATHETERIZATION (CUSTOM PROCEDURE TRAY) ×3 IMPLANT
PROTECTION STATION PRESSURIZED (MISCELLANEOUS) ×3
SHEATH GLIDE SLENDER 4/5FR (SHEATH) ×1 IMPLANT
SHEATH PINNACLE 8F 10CM (SHEATH) ×1 IMPLANT
SLEEVE REPOSITIONING LENGTH 30 (MISCELLANEOUS) ×1 IMPLANT
STATION PROTECTION PRESSURIZED (MISCELLANEOUS) IMPLANT
TRANSDUCER W/STOPCOCK (MISCELLANEOUS) ×3 IMPLANT
TUBING ART PRESS 72  MALE/MALE (TUBING) ×1 IMPLANT
WIRE HI TORQ VERSACORE-J 145CM (WIRE) ×1 IMPLANT

## 2021-06-16 NOTE — Progress Notes (Signed)
This RN notified pt daughter, [name], of patient not returning to [facility] from cath lab. Pt belongings gathered and placed at nursing station for family to pick up. Pt also reassured that MD will be in contact with her from [facility] to update her on her fathers status.

## 2021-06-16 NOTE — Consult Note (Addendum)
Advanced Heart Failure Team Consult Note   Primary Physician: Darreld Mclean, MD PCP-Cardiologist:  Skeet Latch, MD  Reason for Consultation: Acute Systolic Heart Failure>>Cardiogenic Shock   HPI:    Collin Young is seen today for evaluation of acute systolic heart failure w/ cardiogenic shock at the request of Dr. Gasper Sells, Cardiology.   70 y/o male w/ chronic combined systolic and diastolic CHF/ ischemic CM, severe multivessel CAD treated medically, 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    HF dates back to 12/2016. Echo then w/ severely reduced LVEF 10-15%, RV mild-mod reduced, mod AI/MR/TR. LHC showed severe calcific disease in the proximal to mid RCA and chronic total occlusion of the proximal circumflex.There was consideration of RCA PCI.  However, this would have required hemodynamic support and was deferred given his frailty and comorbidities. CAD managed medically.   Repeat Echo 05/2017, EF improved up to 45-50%, Mod AI. RV normal.    Echo 05/2020 EF down again, 35-40%. RV normal   Recently admitted to [facility] 9/22 w/ a/c CHF. Echo EF down 25-30%, RV not well visualized. Mod MR and Mod AI. Chest CT also showed  large right pleural effusion.  There was questionable PE on his CT but ultimately this was thought to be just artifact with poor opacification. He was managed w/ IV Lasix and discharged home on 10/4 on Entresto, Farxiga, Toprol XL and PRN lasix.   Presented back to [facility] ED on 10/12 via EMS w/ acute SOB and hypoxic respiratory failure. Repeat chest CT showed rt lower lobe PE + large rt pleural effusion and small left pleural effusion. BNP >4,500. Underwent Rt Thoracentesis by PCCM, fluid analysis c/w transudative effusion. Heparin started for PE, eventually switched to Eliquis. Cardiology consulted for a/c CHF. Started on IV Lasix for diuresis. Limited Echo repeated, EF 20%, global  HK, RV normal.   Developed signs of low output w/ AKI, SCr bump to 2.11 (baseline ~1.3) + Shock liver, AST 4,265, ALT 3,809. INR 10.3. Transferred to [facility] for [place].   RHC hemodynamics today c/w severe CGS w/ R>L sided HF. CI 1.9 by thermo, 1.8 by FICK. PCWP 13.   Started on milrinone and NE. Transferred to CCU .      [place] 06/16/21 Findings:   RA = 15  RV =  35/14 PA = 36/17 (25) PCW = 13 Fick cardiac output/index = 2.9/1.8 Thermo CO/CI = 3.1/1.9 PVR = 4.0 WU SVR = 2,166 Ao sat = 99% PA sat = 55%, 56% PaPI = 1.3   Assessment:   Cardiogenic shock with R>L heart failure   Limited Echo 10/22:  EF 20%, RV normal   LHC 2018  Prox RCA to Mid RCA lesion, 99 %stenosed. Mid RCA lesion, 30 %stenosed. Dist RCA lesion, 40 %stenosed. RPDA lesion, 20 %stenosed. Prox Cx lesion, 100 %stenosed. Ost Ramus to Ramus lesion, 50 %stenosed. Ost 3rd Diag to 3rd Diag lesion, 30 %stenosed. Mid LAD lesion, 40 %stenosed. Ost LAD to Prox LAD lesion, 20 %stenosed. Ost LM to LM lesion, 40 %stenosed. Hemodynamic findings consistent with mild pulmonary hypertension.   1. Severe calcific disease in the proximal to mid RCA 2. 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 Moderate left main and mid LAD stenosis 4. Elevated filling pressures .   Review of Systems: [y] = yes, [ ]  = no   General: Weight gain [ ] ; Weight loss [ ] ;  Anorexia [ ] ; Fatigue [ ] ; Fever [ ] ; Chills [ ] ; Weakness [ ]   Cardiac: Chest pain/pressure [ ] ; Resting SOB [ Y]; Exertional SOB [ Y]; Orthopnea [ ] ; Pedal Edema [Y ]; Palpitations [ ] ; Syncope [ ] ; Presyncope [ ] ; Paroxysmal nocturnal dyspnea[ ]   Pulmonary: Cough [ ] ; Wheezing[ ] ; Hemoptysis[ ] ; Sputum [ ] ; Snoring [ ]   GI: Vomiting[ ] ; Dysphagia[ ] ; Melena[ ] ; Hematochezia [ ] ; Heartburn[ ] ; Abdominal pain [ ] ; Constipation [ ] ; Diarrhea [ ] ; BRBPR [ ]   GU: Hematuria[ ] ; Dysuria [ ] ; Nocturia[ ]   Vascular: Pain in legs with walking [ ] ; Pain in feet with lying flat  [ ] ; Non-healing sores [ ] ; Stroke [ ] ; TIA [ ] ; Slurred speech [ ] ;  Neuro: Headaches[ ] ; Vertigo[ ] ; Seizures[ ] ; Paresthesias[ ] ;Blurred vision [ ] ; Diplopia [ ] ; Vision changes [ ]   Ortho/Skin: Arthritis [ ] ; Joint pain [ ] ; Muscle pain [ ] ; Joint swelling [ ] ; Back Pain [ ] ; Rash [ ]   Psych: Depression[ ] ; Anxiety[ ]   Heme: Bleeding problems [ ] ; Clotting disorders [ ] ; Anemia [ ]   Endocrine: Diabetes [ ] ; Thyroid dysfunction[ ]   Home Medications Prior to Admission medications   Medication Sig Start Date End Date Taking? Authorizing Provider  albuterol (VENTOLIN HFA) 108 (90 Base) MCG/ACT inhaler INHALE 2 PUFFS INTO THE LUNGS EVERY 6 HOURS AS NEEDED FOR WHEEZING OR SHORTNESS OF BREATH Patient taking differently: Inhale 2 puffs into the lungs every 6 (six) hours as needed for wheezing or shortness of breath. 03/28/21  Yes Copland, Gay Filler, MD  BAYER LOW DOSE 81 MG EC tablet Take 81 mg by mouth daily. Swallow whole.   Yes [provider]  dapagliflozin propanediol (FARXIGA) 10 MG TABS tablet Take 1 tablet (10 mg total) by mouth daily. 05/12/21  Yes Georgette Shell, MD  ENTRESTO 24-26 MG TAKE 1 TABLET BY MOUTH TWICE DAILY Patient taking differently: Take 0.5 tablets by mouth 2 (two) times daily. 11/11/20  Yes Skeet Latch, MD  Evolocumab (REPATHA SURECLICK) 409 MG/ML SOAJ Inject 140 mg into the skin every 14 (fourteen) days. 11/14/20  Yes Skeet Latch, MD  fentaNYL (DURAGESIC) 12 MCG/HR Place 1 patch onto the skin every 3 (three) days. 06/04/21  Yes [name], MD  furosemide (LASIX) 40 MG tablet Take 40 mg of Lasix if you gain weight more than 2 pounds per day or more than 5 pounds per week Patient taking differently: Take 40 mg by mouth every Monday, Wednesday, and Friday. 05/11/21  Yes Georgette Shell, MD  Multiple Vitamin (MULTIVITAMIN) tablet Take 1 tablet by mouth 3 (three) times a week.   Yes [provider]  polyethylene glycol (MIRALAX /  GLYCOLAX) 17 g packet Take 17 g by mouth daily as needed for mild constipation. Patient taking differently: Take 17 g by mouth daily as needed for mild constipation (MIX AND DRINK AS DIRECTED). 05/11/21  Yes Georgette Shell, MD  pravastatin (PRAVACHOL) 80 MG tablet Take 1 tablet (80 mg total) by mouth every evening. 04/30/21 07/29/21 Yes Skeet Latch, MD  metoprolol succinate (TOPROL-XL) 25 MG 24 hr tablet Take 0.5 tablets (12.5 mg total) by mouth daily. Patient not taking: Reported on 06/11/2021 05/12/21   Georgette Shell, MD    Past Medical History: Past Medical History:  Diagnosis Date   Chronic combined systolic and diastolic CHF (congestive heart failure) ([place])    followed by [name]   COPD (chronic obstructive  pulmonary disease) [facility])    Coronary artery disease cardiologist-- [name]   per cardiac cath 01-05-2017  chronic total occlusion pLCx with collaterals, 99% severe calcified prox. to mid RCA, otherwise mild to moderate CAD (medically managed)   Esophageal cancer, stage IIIB [facility]) oncologist-  dr ennever/  dr moody   dx 2010  SCC Stage IIIB completed chemoradiation;  localized recurrent left piriform sinus 01/ 2015,  completed concurrent chemoradiatoin 04/ 2015   GERD (gastroesophageal reflux disease)    09-07-2018   no issues since Gtube removed 06/ 2019   History of alcohol abuse    quit 2001   History of cancer chemotherapy    2010;   2015   History of radiation therapy    10-19-2013 to  12-05-2013 pyriform sinus 69.96 Gy/39fx;   Radiation completed 2010 for esophageal cancer   History of seizure 2001   alcohol withdrawal   HOH (hard of hearing)    Hyperlipidemia    Hypertension    Ischemic cardiomyopathy 12/2016   01-03-2017  echo,  ef 10-15%/   echo 05-06-2017 EF improved to 45-50%   Prostate cancer [facility]) urologist-  dr ottelin/  oncologist-- [name]   dx 05-27-2018--- Stage T1c,  Gleason 4+3,  PSA 9.3--  scheduled for brachytherapy  09-23-2017   Renal cyst, left     Past Surgical History: Past Surgical History:  Procedure Laterality Date   CARDIOVASCULAR STRESS TEST  10/09/09   normal nuclearr stress test, EF 57% ([place])   IR GASTROSTOMY TUBE MOD SED  01/13/2017   IR GASTROSTOMY TUBE REMOVAL  02/03/2018   IR PATIENT EVAL TECH 0-60 MINS  03/25/2017   IR REMOVAL TUN ACCESS W/ PORT W/O FL MOD SED  01/15/2017   IR REPLACE G-TUBE SIMPLE WO FLUORO  01/13/2018   LARYNGOSCOPY N/A 09/15/2013   Procedure: LARYNGOSCOPY;  Surgeon: [name], MD;  Location: [place];  Service: ENT;  Laterality: N/A;  direct laryngoscopy with biopsy and esophagoscopy   RADIOACTIVE SEED IMPLANT N/A 09/23/2018   Procedure: RADIOACTIVE SEED IMPLANT/BRACHYTHERAPY IMPLANT;  Surgeon: [name], MD;  Location: [place];  Service: Urology;  Laterality: N/A;   RIGHT/LEFT HEART CATH AND CORONARY ANGIOGRAPHY N/A 01/05/2017   Procedure: Right/Left Heart Cath and Coronary Angiography;  Surgeon: [name], MD;  Location: [place] CV LAB;  Service: Cardiovascular;  Laterality: N/A;   TRANSTHORACIC ECHOCARDIOGRAM  05/06/2017   ef 45-50%,  grade 1 diastolic dysfunction/  AV severe calcified non coronary cusp with moderate regurg. , no stenosis (valve area per echo 01-05-2017 1.08cm^2)/  mild MR, TR, and PR/  mild LAE    Family History: Family History  Problem Relation Age of Onset   Breast cancer Mother    Prostate cancer Neg Hx    Colon cancer Neg Hx    Pancreatic cancer Neg Hx     Social History: Social History   Socioeconomic History   Marital status: Legally Separated    Spouse name: Not on file   Number of children: 2   Years of education: Not on file   Highest education level: Not on file  Occupational History   Occupation: retired  Tobacco Use   Smoking status: Former    Packs/day: 1.00    Years: 40.00    Pack years: 40.00    Types: Cigarettes    Start date: 11/08/1960    Quit date: 08/31/2009    Years  since quitting: 11.8   Smokeless tobacco: Former  Types: Sarina Ser    Quit date: 09/01/1999  Vaping Use   Vaping Use: Never used  Substance and Sexual Activity   Alcohol use: Not Currently    Alcohol/week: 0.0 standard drinks    Comment: quit in 2001   Drug use: No    Comment: back in the day used cocaine,alcohol, marijuana   Sexual activity: Yes    Partners: Female    Birth control/protection: None  Other Topics Concern   Not on file  Social History Narrative   Not on file   Social Determinants of Health   Financial Resource Strain: Not on file  Food Insecurity: Not on file  Transportation Needs: Not on file  Physical Activity: Not on file  Stress: Not on file  Social Connections: Not on file    Allergies:  No Known Allergies  Objective:    Vital Signs:   Temp:  [98.1 F (36.7 C)-98.8 F (37.1 C)] 98.8 F (37.1 C) (10/17 0451) Pulse Rate:  [81-88] 82 (10/17 1012) Resp:  [12-21] 21 (10/17 1012) BP: (126-147)/(69-75) 147/74 (10/17 1012) SpO2:  [95 %-99 %] 98 % (10/17 1045) Last BM Date: 06/15/21  Weight change: Filed Weights   06/11/21 0109 06/12/21 0439  Weight: 62 kg 52.7 kg    Intake/Output:   Intake/Output Summary (Last 24 hours) at 06/16/2021 1227 Last data filed at 06/16/2021 0911 Gross per 24 hour  Intake 240 ml  Output 725 ml  Net -485 ml      Physical Exam    General:  chronically ill/ cachetic appearing. Looks much older than actual age No resp difficulty HEENT: normal Neck: supple. Distended neck veins, JVD elevated to jaw . Carotids 2+ bilat; no bruits. No lymphadenopathy or thyromegaly appreciated. + Swan Lt IJ Cor: PMI nondisplaced. Regular rate & rhythm. No rubs, gallops or murmurs. Lungs: clear Abdomen: soft, nontender, nondistended. No hepatosplenomegaly. No bruits or masses. Good bowel sounds. Extremities: no cyanosis, clubbing, rash, thin extremities no edema Neuro: alert & orientedx3, cranial nerves grossly intact. moves all 4  extremities w/o difficulty. Affect pleasant   Telemetry   NSR 70s  EKG    NSR 85 bpm w/ PVC, LVH, incomplete LBBB   Labs   Basic Metabolic Panel: Recent Labs  Lab 06/12/21 0327 06/13/21 1014 06/14/21 0402 06/15/21 0329 06/16/21 0406  NA 136 131* 133* 129* 131*  K 4.2 3.9 5.2* 4.5 4.7  CL 101 94* 95* 90* 92*  CO2 24 25 12* 22 26  GLUCOSE 113* 164* 90 119* 102*  BUN 32* 41* 53* 69* 80*  CREATININE 1.74* 1.90* 2.11* 2.07* 1.98*  CALCIUM 9.2 8.9 8.9 9.3 9.3  MG  --   --   --  3.0* 3.0*    Liver Function Tests: Recent Labs  Lab 06/11/21 0225 06/12/21 0327 06/15/21 0329 06/16/21 0406  AST 31 22 3,936* 4,265*  ALT 18 15 2,964* 3,809*  ALKPHOS 38 34* 55 54  BILITOT 0.7 0.6 0.8 1.5*  PROT 7.4 6.7 7.0 6.8  ALBUMIN 4.0 3.5 3.8 3.8   No results for input(s): LIPASE, AMYLASE in the last 168 hours. No results for input(s): AMMONIA in the last 168 hours.  CBC: Recent Labs  Lab 06/11/21 0225 06/12/21 0327 06/13/21 0319 06/14/21 0402 06/15/21 0329 06/16/21 0406  WBC 4.2 8.8 7.0 11.8* 11.9* 9.2  NEUTROABS 3.2  --   --   --   --   --   HGB 13.0 13.1 13.5 15.7 13.7 12.5*  HCT 41.0 39.0 42.5  50.5 42.2 38.2*  MCV 85.2 83.3 85.9 87.8 84.4 83.6  PLT 207 217 208 203 147* 83*    Cardiac Enzymes: No results for input(s): CKTOTAL, CKMB, CKMBINDEX, TROPONINI in the last 168 hours.  BNP: BNP (last 3 results) Recent Labs    05/11/21 0523 06/02/21 0311 06/11/21 0225  BNP 1,797.3* 2,342.9* >4,500.0*    ProBNP (last 3 results) Recent Labs    05/14/21 0935  PROBNP 4,378.0*     CBG: No results for input(s): GLUCAP in the last 168 hours.  Coagulation Studies: Recent Labs    06/15/21 0831  LABPROT 81.4*  INR 10.3*     Imaging   No results found.   Medications:     Current Medications:  guaiFENesin  600 mg Oral BID   hydrALAZINE  25 mg Oral Q6H   isosorbide mononitrate  30 mg Oral Daily   metoprolol succinate  50 mg Oral Daily   polyethylene  glycol  17 g Oral Daily    Infusions:  milrinone     norepinephrine (LEVOPHED) Adult infusion        Assessment/Plan   1. Acute on Chronic Systolic Heart Failure w/ Cardiogenic Shock  - Echo 2018 EF 10-15%, RV moderately reduced. LHC w/ MVCAD management medically - Echo 05/2017, EF improved up to 45-50%, Mod AI. RV normal.   - Echo 05/2020 EF down again, 35-40%. RV normal  - Echo 05/2021 EF down 25-30%, RV not well visualized. Mod MR and Mod AI. - Limited Echo 10/22: EF 20%, RV normal  - Admitted w/ a/c CHF w/ cardiogenic shock w/ AKI + Shock liver - RHC w/ R>L HF and low output, CI 1.8. PAPi 1.3  - Start Milrinone 0.25 + NE and monitor response  - Follow hemodynamics off Swan  - GDMT limited by AKI  - Stop Hydaral and Imdur w/ use of milrinone - Stop ? blocker w/ low output  - PCWP 13 on RHC, hold diuretics for now  - Overall prognosis appears quite poor. Not candidate for advanced therapies  2. Acute Pulmonary Embolism  - CT w/ rt lower lobe PE  - Limited echo w/ no RV strain  - Restart a/c once INR drifts down, 10.3 today   3. Shock Liver - AST 4,265, ALT 3,809. INR 10.3 - 2/2 CGS - start inotropic support, follow CMP and INRs  4. AKI on Stage IIIa - Baseline SCr 1.3 - Bumped to 2.11 - Cardiorenal  - start inotropic support, follow CMP   5. Large Rt Pleural Effusion  - s/p thoracentesis by CCM 10/12 - 1200 cc removed, transudative    Length of Stay: [address] PA-C  06/16/2021, 12:27 PM  Advanced Heart Failure Team Pager [phone] (M-F; 7a - 5p)  Please contact [facility] for night-coverage after hours (4p -7a ) and weekends on [email or web address]   Agree with above.   70 y/o male with multiple medical issues including previous head & neck cancer s/p chemo/XRT x 2, CKD, longstanding systolic HF due to severe iCM EF < 20% .  Had been doing well despite low EF until recently. Admitted with cardiogenic shock and also found to have RLL PE.   Taken for  RHC today and found to have low output with R>L HF.   General:  Thin weak appearing. No resp difficulty HEENT: normal Neck: supple. JVP to ear Carotids 2+ bilat; no bruits. No lymphadenopathy or thryomegaly appreciated. Cor: PMI nondisplaced. Regular rate & rhythm. + s3 Lungs: clear  Abdomen: soft, nontender, nondistended. No hepatosplenomegaly. No bruits or masses. Good bowel sounds. Extremities: no cyanosis, clubbing, rash, edema Neuro: alert & orientedx3, cranial nerves grossly intact. moves all 4 extremities w/o difficulty. Affect pleasant  He is critically ill with what I suspect is end-stage HF with profound cardiogenic shock and MSOF. Given previous excellent functional capacity will attempt to optimize with inotropes for several days and see if we can get him back on track. Not candidate for advanced therapies. Suspect PE is secondary due to low flow/stasis from HF. Consider palliative care consultation.   CRITICAL CARE Performed by: Glori Bickers  Total critical care time: 55 minutes  Critical care time was exclusive of separately billable procedures and treating other patients.  Critical care was necessary to treat or prevent imminent or life-threatening deterioration.  Critical care was time spent personally by me (independent of midlevel providers or residents) on the following activities: development of treatment plan with patient and/or surrogate as well as nursing, discussions with consultants, evaluation of patient's response to treatment, examination of patient, obtaining history from patient or surrogate, ordering and performing treatments and interventions, ordering and review of laboratory studies, ordering and review of radiographic studies, pulse oximetry and re-evaluation of patient's condition.  Glori Bickers, MD  2:31 PM

## 2021-06-16 NOTE — Progress Notes (Signed)
Pharmacy Antibiotic Note  Collin Young is a 70 y.o. male admitted on 06/11/2021 with dyspnea and was found to have acute RLL PE without heart strain.  Pt also with acute on chronic systolic HF with cardiogenic shock, shock liver, AI and CKD, R pleural effusion. Pharmacy has been consulted for Zosyn dosing.  WBC 9.2; Tmax 98.8 F; Scr 1.98, CrCl 25.9 ml/min (slightly improved from past couple of days)  Plan: Cefepime 2 gm IV Q 24 hrs Monitor WBC, temp, clinical course, cultures, renal function  Height: 5\' 6"  (167.6 cm) Weight: 52.7 kg (116 lb 2.9 oz) IBW/kg (Calculated) : 63.8  Temp (24hrs), Avg:98.5 F (36.9 C), Min:98.1 F (36.7 C), Max:98.8 F (37.1 C)  Recent Labs  Lab 06/12/21 0327 06/13/21 0319 06/13/21 1014 06/14/21 0402 06/15/21 0329 06/16/21 0406  WBC 8.8 7.0  --  11.8* 11.9* 9.2  CREATININE 1.74*  --  1.90* 2.11* 2.07* 1.98*    Estimated Creatinine Clearance: 25.9 mL/min (A) (by C-G formula based on SCr of 1.98 mg/dL (H)).    No Known Allergies  Antimicrobials this admission: Ceftriaxone X 1 10/12 Doxycycline PO X 1 10/12  Microbiology results: 10/12 COVID, flu A, flu B: negative 10/17 Hepatitis A, B C screen: negative 10/12 Pleural cx: NG/final; fungal cx pending (KOH negative) 10/16 Throat cx: pending  Thank you for allowing pharmacy to be a part of this patient's care.  Gillermina Hu, PharmD, BCPS, [facility] Clinical Pharmacist 06/16/2021 3:43 PM

## 2021-06-16 NOTE — Progress Notes (Signed)
PROGRESS NOTE  Collin Young LOV:[id number] DOB: 03-05-1951 DOA: 06/11/2021 PCP: Darreld Mclean, MD   LOS: 5 days   Brief Narrative / Interim history: 70 year old male with history of combined systolic diastolic CHF, esophageal cancer, prostate cancer, COPD, HLD, CAD, chronic kidney disease stage IIIa comes to the hospital with dyspnea.  Symptoms started the day prior to admission, not relieved by his home inhalers and eventually ended up requiring to come to the hospital.  In the ED CT angiogram showed pulmonary embolism lobar to segmental in the right lower lobe as well as large right and small left pleural effusion along with mild interstitial edema.  Pulmonary was consulted and he is status post thoracentesis on 10/12  Subjective / 24h Interval events: Continues to complain of shortness of breath.  No abdominal pain, no nausea or vomiting this morning  Assessment & Plan: Principal Problem Acute pulmonary embolism-patient was maintained on heparin infusion in the first day, no bleeding and seems to be tolerating well.  I discussed with him the risks/benefits for oral anticoagulation, patient prefers oral anticoagulant that can be crushed, he was transitioned to Eliquis on 10/13, which is now on hold due to right heart cath today.  Active Problems Acute on chronic combined systolic/diastolic CHF-patient had some evidence of fluid overload on admission with large pleural effusion.  He is status post right-sided thoracentesis on admission, transudative -He was initially given IV Lasix which was held due to rising creatinine.  Currently appears again fluid overloaded, creatinine has stabilized   Acute kidney injury on chronic kidney disease stage IIIa-Baseline creatinine 1.2-1.4, received few doses of Lasix on admission but also CT with contrast.   -Creatinine higher than baseline but stabilized, received Lasix yesterday and overall stable today  Elevated liver enzymes,?  Shock liver,  thrombocytopenia, elevated INR-his LFTs were normal on admission but now significantly elevated in the 3000-4000 range, worsening today.  His RV function appeared to be normal on echo and he was never hypotensive, but there is a suspicion that this is heart failure induced.  Awaiting right heart cath results today.  Discussed with gastroenterology as well as cardiology, not entirely clear picture here  HLD - continue statin   Chronic bilateral pleural effusions - s/p right-sided thoracentesis 10/12.  Diuresis as above  Sore throat-appeared 10/14, supportive treatment.    Hx of eosphageal CA  Hx of prostate CA -outpatient management  Scheduled Meds:  [MAR Hold] guaiFENesin  600 mg Oral BID   [MAR Hold] hydrALAZINE  25 mg Oral Q6H   [MAR Hold] isosorbide mononitrate  30 mg Oral Daily   [MAR Hold] metoprolol succinate  50 mg Oral Daily   [MAR Hold] polyethylene glycol  17 g Oral Daily   sodium chloride flush  3 mL Intravenous Q12H   Continuous Infusions:  sodium chloride     sodium chloride      PRN Meds:.sodium chloride, [MAR Hold] albuterol, [MAR Hold] lidocaine, [MAR Hold] menthol-cetylpyridinium, [MAR Hold] nitroGLYCERIN, [MAR Hold] ondansetron (ZOFRAN) IV, [MAR Hold] phenol, sodium chloride flush  Diet Orders (From admission, onward)     Start     Ordered   06/16/21 0001  Diet NPO time specified  Diet effective midnight        06/15/21 1825            DVT prophylaxis:      Code Status: Full Code  Family Communication: no family at bedside   Status is: Inpatient  Remains inpatient appropriate because:Inpatient level  of care appropriate due to severity of illness  Dispo: The patient is from: Home              Anticipated d/c is to: Home              Patient currently is not medically stable to d/c.   Difficult to place patient No   Level of care: Telemetry  Consultants:  Pulmonary Cardiology  Procedures:  2D echo  Microbiology   none  Antimicrobials: none    Objective: Vitals:   06/15/21 2049 06/15/21 2300 06/16/21 0451 06/16/21 1012  BP: 126/75 136/74 132/69 (!) 147/74  Pulse: 88 83 81 82  Resp: 12  14 (!) 21  Temp: 98.1 F (36.7 C)  98.8 F (37.1 C)   TempSrc:   Oral   SpO2: 95% 96% 99% 97%  Weight:      Height:        Intake/Output Summary (Last 24 hours) at 06/16/2021 1039 Last data filed at 06/16/2021 0911 Gross per 24 hour  Intake 360 ml  Output 875 ml  Net -515 ml    Filed Weights   06/11/21 0109 06/12/21 0439  Weight: 62 kg 52.7 kg    Examination:  Constitutional: Appears comfortable, in bed Eyes: No scleral icterus ENMT: mmm Neck: normal, supple Respiratory: Diminished at the bases, no wheezing Cardiovascular: Regular rate and rhythm, no edema Abdomen: Soft, nontender, nondistended, positive bowel sounds Musculoskeletal: no clubbing / cyanosis.  Skin: No rashes seen Neurologic: No focal deficits, equal strength   Data Reviewed: I have independently reviewed following labs and imaging studies  CBC: Recent Labs  Lab 06/11/21 0225 06/12/21 0327 06/13/21 0319 06/14/21 0402 06/15/21 0329 06/16/21 0406  WBC 4.2 8.8 7.0 11.8* 11.9* 9.2  NEUTROABS 3.2  --   --   --   --   --   HGB 13.0 13.1 13.5 15.7 13.7 12.5*  HCT 41.0 39.0 42.5 50.5 42.2 38.2*  MCV 85.2 83.3 85.9 87.8 84.4 83.6  PLT 207 217 208 203 147* 83*    Basic Metabolic Panel: Recent Labs  Lab 06/12/21 0327 06/13/21 1014 06/14/21 0402 06/15/21 0329 06/16/21 0406  NA 136 131* 133* 129* 131*  K 4.2 3.9 5.2* 4.5 4.7  CL 101 94* 95* 90* 92*  CO2 24 25 12* 22 26  GLUCOSE 113* 164* 90 119* 102*  BUN 32* 41* 53* 69* 80*  CREATININE 1.74* 1.90* 2.11* 2.07* 1.98*  CALCIUM 9.2 8.9 8.9 9.3 9.3  MG  --   --   --  3.0* 3.0*    Liver Function Tests: Recent Labs  Lab 06/11/21 0225 06/12/21 0327 06/15/21 0329 06/16/21 0406  AST 31 22 3,936* 4,265*  ALT 18 15 2,964* 3,809*  ALKPHOS 38 34* 55 54   BILITOT 0.7 0.6 0.8 1.5*  PROT 7.4 6.7 7.0 6.8  ALBUMIN 4.0 3.5 3.8 3.8    Coagulation Profile: Recent Labs  Lab 06/11/21 0536 06/15/21 0831  INR 1.2 10.3*    HbA1C: No results for input(s): HGBA1C in the last 72 hours. CBG: No results for input(s): GLUCAP in the last 168 hours.  Recent Results (from the past 240 hour(s))  Resp Panel by RT-PCR (Flu A&B, Covid) Nasopharyngeal Swab     Status: None   Collection Time: 06/11/21  5:25 AM   Specimen: Nasopharyngeal Swab; Nasopharyngeal(NP) swabs in vial transport medium  Result Value Ref Range Status   SARS Coronavirus 2 by RT PCR NEGATIVE NEGATIVE Final  Comment: (NOTE) SARS-CoV-2 target nucleic acids are NOT DETECTED.  The SARS-CoV-2 RNA is generally detectable in upper respiratory specimens during the acute phase of infection. The lowest concentration of SARS-CoV-2 viral copies this assay can detect is 138 copies/mL. A negative result does not preclude SARS-Cov-2 infection and should not be used as the sole basis for treatment or other patient management decisions. A negative result may occur with  improper specimen collection/handling, submission of specimen other than nasopharyngeal swab, presence of viral mutation(s) within the areas targeted by this assay, and inadequate number of viral copies(<138 copies/mL). A negative result must be combined with clinical observations, patient history, and epidemiological information. The expected result is Negative.  Fact Sheet for Patients:  EntrepreneurPulse.com.au  Fact Sheet for Healthcare Providers:  IncredibleEmployment.be  This test is no t yet approved or cleared by the [place] FDA and  has been authorized for detection and/or diagnosis of SARS-CoV-2 by FDA under an Emergency Use Authorization (EUA). This EUA will remain  in effect (meaning this test can be used) for the duration of the COVID-19 declaration under Section  564(b)(1) of the Act, 21 U.S.C.section 360bbb-3(b)(1), unless the authorization is terminated  or revoked sooner.       Influenza A by PCR NEGATIVE NEGATIVE Final   Influenza B by PCR NEGATIVE NEGATIVE Final    Comment: (NOTE) The Xpert Xpress SARS-CoV-2/FLU/RSV plus assay is intended as an aid in the diagnosis of influenza from Nasopharyngeal swab specimens and should not be used as a sole basis for treatment. Nasal washings and aspirates are unacceptable for Xpert Xpress SARS-CoV-2/FLU/RSV testing.  Fact Sheet for Patients: EntrepreneurPulse.com.au  Fact Sheet for Healthcare Providers: IncredibleEmployment.be  This test is not yet approved or cleared by the [place] FDA and has been authorized for detection and/or diagnosis of SARS-CoV-2 by FDA under an Emergency Use Authorization (EUA). This EUA will remain in effect (meaning this test can be used) for the duration of the COVID-19 declaration under Section 564(b)(1) of the Act, 21 U.S.C. section 360bbb-3(b)(1), unless the authorization is terminated or revoked.  Performed at [facility], [place] [address] [place], [place] [address]   Body fluid culture w Gram Stain     Status: None   Collection Time: 06/11/21  1:44 PM   Specimen: Pleural Fluid  Result Value Ref Range Status   Specimen Description   Final    PLEURAL Performed at [facility] [address], [place], [place] [address]    Special Requests   Final    NONE Performed at [facility], [place] [address], [place], [place] [address]    Gram Stain   Final    FEW WBC PRESENT, PREDOMINANTLY MONONUCLEAR NO ORGANISMS SEEN    Culture   Final    NO GROWTH 3 DAYS Performed at [facility] [address], [place], [place] [address]    Report Status 06/15/2021 FINAL  Final  Fungus Culture With Stain     Status: None (Preliminary result)   Collection Time: 06/11/21   1:44 PM   Specimen: Pleural Fluid  Result Value Ref Range Status   Fungus Stain Final report  Final    Comment: (NOTE) Performed At: [facility] [address], [place] [id number] Rush Farmer MD [id number]    Fungus (Mycology) Culture PENDING  Incomplete   Fungal Source PLEURAL  Final    Comment: Performed at [facility], [address] [place], [place]  [address]  Fungus Culture Result     Status: None   Collection Time: 06/11/21  1:44 PM  Result Value Ref Range Status   Result 1 Comment  Final    Comment: (NOTE) KOH/Calcofluor preparation:  no fungus observed. Performed At: [facility] [place], [place] [id number] Rush Farmer MD [id number]       Radiology Studies: No results found.   Marzetta Board, MD, PhD [facility]  Between 7 am - 7 pm I am available, please contact me via Amion (for emergencies) or Securechat (non urgent messages)  Between 7 pm - 7 am I am not available, please contact night coverage MD/APP via Amion

## 2021-06-16 NOTE — Progress Notes (Addendum)
NAME:  Collin Young, MRN:  [id number], DOB:  [birth year], LOS: 5 ADMISSION DATE:  06/11/2021, CONSULTATION DATE:  06/12/21 REFERRING MD:  Dr Marylyn Ishihara, CHIEF COMPLAINT: Pleural effusion  History of Present Illness:  Collin Young is a 70 y.o. male with a PMH significant for systolic and diastolic CHF, COPD, prior smoker, CAD, esophageal and prostate cancer s/p chemo and radiation, GERD, prior ETOH abuse, seizures, HTN, and HLD who presented to the ED  for complaints of shortness of breath.    On ED admit patient was treated for acute COPD exacerbation with BDs and supplemental oxygen. CTA chest was obtained that revealed lobar/subsegmental PE's on right with large right and small left pleural effusions. Given PE and pleural effusion PCCM consult for pulmonary eval.    Of note patient was recently admitted at this facility 10/3 - 10/4 for similar presented and was treated for acute CHF exacerbation.  10/17 PCCM re-consulted   Pertinent  Medical History  Combined systolic and congestive heart failure  COPD CAD Esophageal cancer  GERD Remote ETOH and tobacco use  Hx of seizures  HLD HTN Prostate cancer  Significant Hospital Events:  10/12 Admitted with shortness of breath, underwent thoracentesis with 1.2L clear yellow fluid removed - trasnudative by lights   10/13 cardiology consulted with recommendation to increased diuresing, note LFTs normal    10/15 creatinine increased to 2.11 with decreased urine output, also developed chest pain with frequent PVC 10/16 severally elevated LFTs suggesting acute shock liver  10/16  cytology with reactive mesothelial cells  10/17 transferred to [facility] for heart cath  Cath with cardiogenic shock with R > L heart failure   Interim History / Subjective:  Seen lying in bed with no acute acute complaints   Objective   Blood pressure (!) 147/74, pulse 82, temperature 98.8 F (37.1 C), temperature source Oral, resp. rate (!) 21, height 5\' 6"   (1.676 m), weight 52.7 kg, SpO2 98 %.        Intake/Output Summary (Last 24 hours) at 06/16/2021 1337 Last data filed at 06/16/2021 0911 Gross per 24 hour  Intake 240 ml  Output 500 ml  Net -260 ml    Filed Weights   06/11/21 0109 06/12/21 0439  Weight: 62 kg 52.7 kg    Examination: General: Acute on chronically ill appearing very thin deconditioned male, lying in bed in NAD HEENT: Collin Young/AT, MM pink/moist, PERRL,  Neuro: Alert and oriented x3, non-focal CV: s1s2 regular rate and rhythm, no murmur, rubs, or gallops,  PULM:  Clear to ascultation bilaterally, no increased work of breathing, oxygen saturations appropriate on 2L [place]  GI: soft, bowel sounds active in all 4 quadrants, non-tender, non-distended Extremities: warm/dry, no edema  Skin: no rashes or lesions  Resolved Hospital Problem list     Assessment & Plan:  Cardiogenic shock  -Right and left heart cath performed 10/17 per Dr. Haroldine Laws with estimated EF of 10 to 15% and right greater than left heart failure  Combined systolic and diastolic congestive heart failure  -Echocardiogram on admission 10/12 revealed EF of 20% with global hypokinesis, moderate TR, and mild AR P: Cardiology and heart failure following, appreciate assistance Inotropes including milrinone and norepinephrine started per HF Swans per HF Continuous telemetry  ASA and statin Trend BNP Strict intake and output  Daily weight to assess volume status Closely monitor renal function and electrolytes  Provide supplemtal oxygen as needed to maintain oxygen saturations above 90%  Shock liver  -In the  setting of above  Thrombocytopenia  -Platelets 83 10/17 Coagulopathy  -INR 5.8 10/17 P: Trend CBC and INR Monitor ofr signs of bleeding   Concern for evolving HCAP Right subsegmental PE - CTA chest 10/12 was obtained that revealed lobar/subsegmental PE's on right Right greater than left pleural effusions  -Again seen on chest CT on admit,  underwent thoracentesis with 1.2L clear yellow fluid removed - trasnudative by lights   HX of COPD with concern for acute exacerbation on admit -On EMS arrival patient was seen with rhonchi and wheezing bilaterally Former smoker  -Quit 29yrs ago ? Sleep apnea  P: Heparin drip stopped in the setting of coagulopathy  Can try CPAP at night  Encourage pulmonary hygiene  BDs as needed  Empiric Cefepime  Check MRSA swap   Acute Kidney Injury  -in the setting of cardiogenic shock  P: Follow renal function  Monitor urine output Trend Bmet Avoid nephrotoxins Ensure adequate renal perfusion  IV hydration  Hx of HLD P: Continue statin  Hx of esophageal and Prostate cancer  P: No signs of acute issues, supportive care    Best Practice (right click and "Reselect all SmartList Selections" daily)   Diet/type: Regular consistency (see orders) DVT prophylaxis: systemic heparin GI prophylaxis: PPI Lines: Central line Foley:  Yes, and it is still needed Code Status:  full code Last date of multidisciplinary goals of care discussion: Spoke to patient and both daughters regarding current clinical picture and all agree that Collin Young values his quality of life most of all. He wants to continue all interventions available to prolong his life but if his quality of life is limited he and his daughters would want to discuss possible goals of care vs comfort care.  Critical care time:    Performed by: Whitney D. Harris  Total critical care time: 45 minutes  Critical care time was exclusive of separately billable procedures and treating other patients.  Critical care was necessary to treat or prevent imminent or life-threatening deterioration.  Critical care was time spent personally by me on the following activities: development of treatment plan with patient and/or surrogate as well as nursing, discussions with consultants, evaluation of patient's response to treatment, examination of  patient, obtaining history from patient or surrogate, ordering and performing treatments and interventions, ordering and review of laboratory studies, ordering and review of radiographic studies, pulse oximetry and re-evaluation of patient's condition.  Whitney D. Harris, NP-C [facility] Personal contact information can be found on Amion  06/16/2021, 3:25 PM      Attending attestation:  Collin Young is a 70 y/o gentleman with a history of HFrEF, ICM, esophageal and piriform sinus cancer s/p radiation and chemo, prostate cancer, CKD IIIA, COPD, who was admitted on 10/12 with acute respiratory failure and limited exercise tolerance. He was diagnosed with an acute PE in the ED. At the time of admission he had severely elevated BNP. He has been managed on eliquis until yesterday. He has had diagnostic thora for R pleural effusions with 1200cc transudative fluid removed.  Throughout the admission his respiratory status has failed to improve and his LFTs have significantly worsened. He underwent diagnostic RHC today and was found to be in cardiogenic shock. He was started on milrinone and transferred to the ICU. His main complaint today is having a sore throat for a few days.  BP (!) 147/74   Pulse 82   Temp 98.8 F (37.1 C) (Oral)   Resp (!) 21  Ht 5\' 6"  (1.676 m)   Wt 52.7 kg   SpO2 98%   BMI 18.75 kg/m  Chronically ill appearing, frail elderly man lying in bed in NAD [place]/AT, eyes anicteric. Raspy voice. Throat not red, but a few white plaques present. Breathing comfortably on , reduced basilar breath sounds. Thick brown sputum. S1S2S3, reg rate and rhythm, JVD+ Abd soft, NT No rashes or wounds. Skin warm, dry No clubbing or cyanosis, no significant ankle edema. Awake, alert, hard of hearing but answers questions appropriately.  [place]ox 49% AST 4265 ALT 3809 T bili 1.5 Na+ 131 BUN 80 Cr 1.98 WBC 9.2 H/H 12.5/38.2     RHC 06/16/21 Findings: RA = 15  RV  =  35/14 PA = 36/17 (25) PCW = 13 Fick cardiac output/index = 2.9/1.8 Thermo CO/CI = 3.1/1.9 PVR = 4.0 WU SVR = 2,166 Ao sat = 99% PA sat = 55%, 56% PaPI = 1.3   Assessment & plan: Cardiogenic shock with biventricular heart failure Acute PE likely precipitating acute RV failure ICM, CAD -con't heparin infusion -milrinone, serial coox -holding Bblockers, Imdur, hydralazine -con't swan for serial hemodynamics -heparin gtt for DVT with acute liver failure; eventually can resume Eliquis -Ongoing GOC discussions. Not a candidate for advanced therapies. He wants his previous QOL back and is eager to go home, but understands this may not be possible.  Would recommend against other advanced therapies if he develops worse MOF.  Acute liver injury due to congestive hepatopathy with ascites Coagulopathy due to liver injury -serial LFTs -optimize cardiac function -vitamin K -avoid hepatically metabolized medications  AKI, suspect pre-renal due to low flow state. Baseline CKDIIIa -milrinone to optimize perfusion -strict I/Os -renally dose meds, avoid nephrotoxic meds -monitor  Acute respiratory failure due to acute pulm edema and R pleural effusion Possible pneumonia -milrinone -diuresis on hold given normal PCWP -start empiric antibiotics  Hyperglycemia -SSI PRN  Hyponatremia due to heart failure -monitor -goal BG <180  GoC -Discussion with the patient reveals that he wants to return to his previous quality of life with good performance status and being able to live independently at home. He and his family are aware that he may not be able to achieve this. He wants to continue aggressive care at this time. NP Kenton Kingfisher has spoken to his daughters and the patient at length today to discuss his ongoing care. If he fails to improve and develops new end-organ failures, he will be declaring himself as having irreversible heart failure. We will continue discussions.   This patient is  critically ill with multiple organ system failure which requires frequent high complexity decision making, assessment, support, evaluation, and titration of therapies. This was completed through the application of advanced monitoring technologies and extensive interpretation of multiple databases. During this encounter critical care time was devoted to patient care services described in this note for 37 minutes.  Julian Hy, DO 06/16/21 4:00 PM [facility]

## 2021-06-16 NOTE — Progress Notes (Addendum)
ANTICOAGULATION CONSULT NOTE - Initial Consult  Pharmacy Consult for heparin Indication: pulmonary embolus  No Known Allergies  Patient Measurements: Height: 5\' 6"  (167.6 cm) Weight: 52.7 kg (116 lb 2.9 oz) IBW/kg (Calculated) : 63.8 Heparin Dosing Weight: 52.7 kg  Vital Signs: Temp: 98.8 F (37.1 C) (10/17 0451) Temp Source: Oral (10/17 0451) BP: 147/74 (10/17 1012) Pulse Rate: 82 (10/17 1012)  Labs: Recent Labs    06/14/21 0402 06/15/21 0329 06/15/21 0831 06/16/21 0406  HGB 15.7 13.7  --  12.5*  HCT 50.5 42.2  --  38.2*  PLT 203 147*  --  83*  LABPROT  --   --  81.4*  --   INR  --   --  10.3*  --   CREATININE 2.11* 2.07*  --  1.98*    Estimated Creatinine Clearance: 25.9 mL/min (A) (by C-G formula based on SCr of 1.98 mg/dL (H)).   Medical History: Past Medical History:  Diagnosis Date   Chronic combined systolic and diastolic CHF (congestive heart failure) ([place])    followed by [name]   COPD (chronic obstructive pulmonary disease) ([place])    Coronary artery disease cardiologist-- [name]   per cardiac cath 01-05-2017  chronic total occlusion pLCx with collaterals, 99% severe calcified prox. to mid RCA, otherwise mild to moderate CAD (medically managed)   Esophageal cancer, stage IIIB [facility]) oncologist-  dr ennever/  dr moody   dx 2010  SCC Stage IIIB completed chemoradiation;  localized recurrent left piriform sinus 01/ 2015,  completed concurrent chemoradiatoin 04/ 2015   GERD (gastroesophageal reflux disease)    09-07-2018   no issues since Gtube removed 06/ 2019   History of alcohol abuse    quit 2001   History of cancer chemotherapy    2010;   2015   History of radiation therapy    10-19-2013 to  12-05-2013 pyriform sinus 69.96 Gy/59fx;   Radiation completed 2010 for esophageal cancer   History of seizure 2001   alcohol withdrawal   HOH (hard of hearing)    Hyperlipidemia    Hypertension    Ischemic cardiomyopathy 12/2016    01-03-2017  echo,  ef 10-15%/   echo 05-06-2017 EF improved to 45-50%   Prostate cancer [facility]) urologist-  dr ottelin/  oncologist-- [name]   dx 05-27-2018--- Stage T1c,  Gleason 4+3,  PSA 9.3--  scheduled for brachytherapy 09-23-2017   Renal cyst, left     Medications:  Medications Prior to Admission  Medication Sig Dispense Refill Last Dose   albuterol (VENTOLIN HFA) 108 (90 Base) MCG/ACT inhaler INHALE 2 PUFFS INTO THE LUNGS EVERY 6 HOURS AS NEEDED FOR WHEEZING OR SHORTNESS OF BREATH (Patient taking differently: Inhale 2 puffs into the lungs every 6 (six) hours as needed for wheezing or shortness of breath.) 20.1 g 1 06/10/2021 at pm   BAYER LOW DOSE 81 MG EC tablet Take 81 mg by mouth daily. Swallow whole.   06/10/2021 at 0800   dapagliflozin propanediol (FARXIGA) 10 MG TABS tablet Take 1 tablet (10 mg total) by mouth daily. 30 tablet 2 06/10/2021 at am   ENTRESTO 24-26 MG TAKE 1 TABLET BY MOUTH TWICE DAILY (Patient taking differently: Take 0.5 tablets by mouth 2 (two) times daily.) 180 tablet 3 06/10/2021 at am   Evolocumab (REPATHA SURECLICK) 553 MG/ML SOAJ Inject 140 mg into the skin every 14 (fourteen) days. 2 mL 11 05/28/2021   fentaNYL (DURAGESIC) 12 MCG/HR Place 1 patch onto the skin  every 3 (three) days. 10 patch 0 06/06/2021   furosemide (LASIX) 40 MG tablet Take 40 mg of Lasix if you gain weight more than 2 pounds per day or more than 5 pounds per week (Patient taking differently: Take 40 mg by mouth every Monday, Wednesday, and Friday.) 30 tablet 2 06/10/2021 at pm   Multiple Vitamin (MULTIVITAMIN) tablet Take 1 tablet by mouth 3 (three) times a week.   Past Week   polyethylene glycol (MIRALAX / GLYCOLAX) 17 g packet Take 17 g by mouth daily as needed for mild constipation. (Patient taking differently: Take 17 g by mouth daily as needed for mild constipation (MIX AND DRINK AS DIRECTED).) 14 each 0 unk   pravastatin (PRAVACHOL) 80 MG tablet Take 1 tablet (80 mg total) by mouth every  evening. 90 tablet 3 06/10/2021 at pm   metoprolol succinate (TOPROL-XL) 25 MG 24 hr tablet Take 0.5 tablets (12.5 mg total) by mouth daily. (Patient not taking: Reported on 06/11/2021) 30 tablet 2 Not Taking   Scheduled:   Chlorhexidine Gluconate Cloth  6 each Topical Daily   guaiFENesin  600 mg Oral BID   hydrALAZINE  25 mg Oral Q6H   isosorbide mononitrate  30 mg Oral Daily   metoprolol succinate  50 mg Oral Daily   polyethylene glycol  17 g Oral Daily   Infusions:   milrinone 0.25 mcg/kg/min (06/16/21 1321)   norepinephrine (LEVOPHED) Adult infusion      Assessment: Patient is a 70 year old male who presented with dyspnea, found to have an acute pulmonary embolism. Patient was initially started on heparin at [facility] where he was then switched to apixaban. INR remains elevated 24 hours from the last apixaban dose on 10/16. Shock liver may also be contributing as AST/ALT are significantly elevated. Pharmacy consulted to dose heparin. Platelets low at 83. Hemoglobin and hematocrit trending down to 12.5 and 38.2 respectively. Patient was previously therapeutic at [facility] on 1100 units/hour. Baseline aPTT was 32 on 10/17. Will resume heparin at lower dose due to AKI and low platelet count.  Goal of Therapy:  Heparin level 0.3-0.7 units/ml Monitor platelets by anticoagulation protocol: Yes   Plan:  Start heparin at 900 units/hr Check 8 hour aPTT Daily CBC, aPTT, and Heparin level  Thank you for allowing pharmacy to participate in this patient's care.  Reatha Harps, PharmD PGY1 Pharmacy Resident 06/16/2021 1:24 PM Check [email or web address] for unit specific pharmacy number

## 2021-06-16 NOTE — Consult Note (Signed)
Referral MD  Reason for Referral: Pulmonary embolism-right lung; congestive heart failure with hepatic failure; remote history of esophageal cancer and throat cancer  Chief Complaint  Patient presents with   Shortness of Breath  : I have a hard time breathing.  HPI: Collin Young is well-known to me.  He is a very nice 70 year old African-American male.  I first saw him about 12 years ago when he had esophageal cancer.  He was subsequently treated with radiation and chemotherapy.  He did well with this.  About 6 years ago, he then had throat cancer.  Again, he is treated with radiation and chemotherapy.  He has had no evidence of recurrence of either malignancy.  He was admitted on 06/11/2021.  He came in with shortness of breath.  He was found to have a pulmonary embolism.  He had right pulmonary embolism.  His LFTs were incredibly elevated.  He had a CT scan that did not show any obvious metastatic disease.  Gastroenterology has seen him.  They feel that the elevated liver function studies are secondary to his congestive heart failure..  An echocardiogram that showed ejection fraction of 20%.  I am sure that he has right-sided heart failure.  He has a large right pleural effusion.  He has moderate left pleural effusion.  I am sure this is all from his heart failure.  He was on heparin.  His INR yesterday was 10.6.  This is all from his liver failure.  This morning, his bilirubin is 1.5.  His ALT is 3800.  His AST is 4265.  His BUN is 80 creatinine 1.98.  The white cell count is 9.2.  Hemoglobin 12.5.  Platelet count 83,000.  I am sure his platelet count is low because of his hepatic failure.  He does not have much of an appetite.  He has little bit of nausea but no vomiting.  We last saw him in the office back in May.  He is on fentanyl to help with his chronic pain.  Clearly, his prognosis will be dictated by his heart failure.  Hopefully, his liver will improve.  I would say his  performance status is probably ECOG 3.    Past Medical History:  Diagnosis Date   Chronic combined systolic and diastolic CHF (congestive heart failure) ([place])    followed by [name]   COPD (chronic obstructive pulmonary disease) ([place])    Coronary artery disease cardiologist-- [name]   per cardiac cath 01-05-2017  chronic total occlusion pLCx with collaterals, 99% severe calcified prox. to mid RCA, otherwise mild to moderate CAD (medically managed)   Esophageal cancer, stage IIIB [facility]) oncologist-  dr Tess Potts/  dr moody   dx 2010  SCC Stage IIIB completed chemoradiation;  localized recurrent left piriform sinus 01/ 2015,  completed concurrent chemoradiatoin 04/ 2015   GERD (gastroesophageal reflux disease)    09-07-2018   no issues since Gtube removed 06/ 2019   History of alcohol abuse    quit 2001   History of cancer chemotherapy    2010;   2015   History of radiation therapy    10-19-2013 to  12-05-2013 pyriform sinus 69.96 Gy/21fx;   Radiation completed 2010 for esophageal cancer   History of seizure 2001   alcohol withdrawal   HOH (hard of hearing)    Hyperlipidemia    Hypertension    Ischemic cardiomyopathy 12/2016   01-03-2017  echo,  ef 10-15%/   echo 05-06-2017 EF improved to  45-50%   Prostate cancer [facility]) urologist-  dr ottelin/  oncologist-- [name]   dx 05-27-2018--- Stage T1c,  Gleason 4+3,  PSA 9.3--  scheduled for brachytherapy 09-23-2017   Renal cyst, left   :   Past Surgical History:  Procedure Laterality Date   CARDIOVASCULAR STRESS TEST  10/09/09   normal nuclearr stress test, EF 57% ([place])   IR GASTROSTOMY TUBE MOD SED  01/13/2017   IR GASTROSTOMY TUBE REMOVAL  02/03/2018   IR PATIENT EVAL TECH 0-60 MINS  03/25/2017   IR REMOVAL TUN ACCESS W/ PORT W/O FL MOD SED  01/15/2017   IR REPLACE G-TUBE SIMPLE WO FLUORO  01/13/2018   LARYNGOSCOPY N/A 09/15/2013   Procedure: LARYNGOSCOPY;  Surgeon: [name], MD;  Location: [place];  Service:  ENT;  Laterality: N/A;  direct laryngoscopy with biopsy and esophagoscopy   RADIOACTIVE SEED IMPLANT N/A 09/23/2018   Procedure: RADIOACTIVE SEED IMPLANT/BRACHYTHERAPY IMPLANT;  Surgeon: [name], MD;  Location: [place];  Service: Urology;  Laterality: N/A;   RIGHT/LEFT HEART CATH AND CORONARY ANGIOGRAPHY N/A 01/05/2017   Procedure: Right/Left Heart Cath and Coronary Angiography;  Surgeon: [name], MD;  Location: [place] CV LAB;  Service: Cardiovascular;  Laterality: N/A;   TRANSTHORACIC ECHOCARDIOGRAM  05/06/2017   ef 45-50%,  grade 1 diastolic dysfunction/  AV severe calcified non coronary cusp with moderate regurg. , no stenosis (valve area per echo 01-05-2017 1.08cm^2)/  mild MR, TR, and PR/  mild LAE  :   Current Facility-Administered Medications:    albuterol (PROVENTIL) (2.5 MG/3ML) 0.083% nebulizer solution 2.5 mg, 2.5 mg, Nebulization, Q4H PRN, Caren Griffins, MD, 2.5 mg at 06/15/21 0643   guaiFENesin (MUCINEX) 12 hr tablet 600 mg, 600 mg, Oral, BID, Kyle, Tyrone A, DO, 600 mg at 06/15/21 2300   hydrALAZINE (APRESOLINE) tablet 25 mg, 25 mg, Oral, Q6H, Chandrasekhar, Mahesh A, MD, 25 mg at 06/16/21 0703   isosorbide mononitrate (IMDUR) 24 hr tablet 30 mg, 30 mg, Oral, Daily, Chandrasekhar, Mahesh A, MD, 30 mg at 06/15/21 1331   lidocaine (XYLOCAINE) 2 % viscous mouth solution 15 mL, 15 mL, Mouth/Throat, Q4H PRN, Gherghe, Vella Redhead, MD   menthol-cetylpyridinium (CEPACOL) lozenge 3 mg, 1 lozenge, Oral, PRN, Caren Griffins, MD, 3 mg at 06/13/21 1939   metoprolol succinate (TOPROL-XL) 24 hr tablet 50 mg, 50 mg, Oral, Daily, Chandrasekhar, Mahesh A, MD, 50 mg at 06/15/21 1128   nitroGLYCERIN (NITROSTAT) SL tablet 0.4 mg, 0.4 mg, Sublingual, Q5 Min x 3 PRN, Chandrasekhar, Mahesh A, MD   ondansetron (ZOFRAN) injection 4 mg, 4 mg, Intravenous, Q8H PRN, Blount, Xenia T, NP, 4 mg at 06/13/21 1938   phenol (CHLORASEPTIC) mouth spray 1 spray, 1 spray,  Mouth/Throat, PRN, Kyle, Tyrone A, DO, 1 spray at 06/11/21 1944   polyethylene glycol (MIRALAX / GLYCOLAX) packet 17 g, 17 g, Oral, Daily, Gherghe, Costin M, MD, 17 g at 06/15/21 1129  Facility-Administered Medications Ordered in Other Encounters:    topical emolient (BIAFINE) emulsion, , Topical, Daily, Kyung Rudd, MD, Given at 01/19/14 1610:   guaiFENesin  600 mg Oral BID   hydrALAZINE  25 mg Oral Q6H   isosorbide mononitrate  30 mg Oral Daily   metoprolol succinate  50 mg Oral Daily   polyethylene glycol  17 g Oral Daily  :  No Known Allergies:   Family History  Problem Relation Age of Onset   Breast cancer Mother    Prostate cancer Neg  Hx    Colon cancer Neg Hx    Pancreatic cancer Neg Hx   :   Social History   Socioeconomic History   Marital status: Legally Separated    Spouse name: Not on file   Number of children: 2   Years of education: Not on file   Highest education level: Not on file  Occupational History   Occupation: retired  Tobacco Use   Smoking status: Former    Packs/day: 1.00    Years: 40.00    Pack years: 40.00    Types: Cigarettes    Start date: 11/08/1960    Quit date: 08/31/2009    Years since quitting: 11.8   Smokeless tobacco: Former    Types: Chew    Quit date: 09/01/1999  Vaping Use   Vaping Use: Never used  Substance and Sexual Activity   Alcohol use: Not Currently    Alcohol/week: 0.0 standard drinks    Comment: quit in 2001   Drug use: No    Comment: back in the day used cocaine,alcohol, marijuana   Sexual activity: Yes    Partners: Female    Birth control/protection: None  Other Topics Concern   Not on file  Social History Narrative   Not on file   Social Determinants of Health   Financial Resource Strain: Not on file  Food Insecurity: Not on file  Transportation Needs: Not on file  Physical Activity: Not on file  Stress: Not on file  Social Connections: Not on file  Intimate Partner Violence: Not on file   :  Pertinent items are noted in HPI.  Exam: Patient Vitals for the past 24 hrs:  BP Temp Temp src Pulse Resp SpO2  06/16/21 0451 132/69 98.8 F (37.1 C) Oral 81 14 99 %  06/15/21 2300 136/74 -- -- 83 -- 96 %  06/15/21 2049 126/75 98.1 F (36.7 C) -- 88 12 95 %  06/15/21 1155 125/69 97.7 F (36.5 C) Oral 86 -- 100 %  06/15/21 1128 135/85 -- -- 83 -- --   Physical Exam Vitals reviewed.  HENT:     Head: Normocephalic and atraumatic.  Eyes:     Pupils: Pupils are equal, round, and reactive to light.  Cardiovascular:     Rate and Rhythm: Normal rate and regular rhythm.     Heart sounds: Normal heart sounds.  Pulmonary:     Effort: Pulmonary effort is normal.     Breath sounds: Normal breath sounds.  Abdominal:     General: Bowel sounds are normal.     Palpations: Abdomen is soft.  Musculoskeletal:        General: No tenderness or deformity. Normal range of motion.     Cervical back: Normal range of motion.  Lymphadenopathy:     Cervical: No cervical adenopathy.  Skin:    General: Skin is warm and dry.     Findings: No erythema or rash.  Neurological:     Mental Status: He is alert and oriented to person, place, and time.  Psychiatric:        Behavior: Behavior normal.        Thought Content: Thought content normal.        Judgment: Judgment normal.      Recent Labs    06/15/21 0329 06/16/21 0406  WBC 11.9* 9.2  HGB 13.7 12.5*  HCT 42.2 38.2*  PLT 147* 83*    Recent Labs    06/15/21 0329 06/16/21 0406  NA 129*  131*  K 4.5 4.7  CL 90* 92*  CO2 22 26  GLUCOSE 119* 102*  BUN 69* 80*  CREATININE 2.07* 1.98*  CALCIUM 9.3 9.3    Blood smear review: None  Pathology: None    Assessment and Plan: Collin Young is [name].  He now has pulmonary emboli.  He has significant hepatic failure.  His bilirubin is trending up.  His SGPT/SGOT are incredibly elevated.  He is off anticoagulation because he is "auto  "anticoagulated.  I do not see any evidence of malignancy that is come back.  I suspect that the pulmonary embolism is more related to his congestive heart failure.  There is not much for [place] to do right now.  We will certainly pray for him.  He does have a strong faith.  I just feel bad that this is happened to him.  He is already been so active.  I just hate that he now is dealing with this issue.  I know that he will get outstanding care from all staff up on [address], MD  Penelope Coop 6:2 He now

## 2021-06-16 NOTE — Progress Notes (Addendum)
Daily Rounding Note  06/16/2021, 1:38 PM  LOS: 5 days   SUBJECTIVE:   Chief complaint: Elevated LFTs, presumed shock liver.  Vital signs stable.  Excellent saturations on 2 L nasal cannula oxygen.  Denies dyspnea.  Complaining of burning in his throat and sticking to bland foods such as applesauce and ice cream to avoid triggering the burning.  Did not tolerate tomato soup.  Anorexia also persists.  No nausea, no abdominal pain.  Had a bowel movement this morning, brown.. Of epinephrine, remains on Milrinone  OBJECTIVE:         Vital signs in last 24 hours:    Temp:  [98.1 F (36.7 C)-98.8 F (37.1 C)] 98.8 F (37.1 C) (10/17 0451) Pulse Rate:  [81-88] 82 (10/17 1012) Resp:  [12-21] 21 (10/17 1012) BP: (126-147)/(69-75) 147/74 (10/17 1012) SpO2:  [95 %-99 %] 98 % (10/17 1045) Last BM Date: 06/15/21 Filed Weights   06/11/21 0109 06/12/21 0439  Weight: 62 kg 52.7 kg   General: Looks frail and somewhat ill but comfortable resting in bed. Heart: RRR. Chest: No labored breathing or cough. Abdomen: Soft without tenderness or distention.  Bowel sounds active Extremities: No CCE. Neuro/Psych: Not confused.  No tremors or gross weakness.  Moves all 4 limbs.  Intake/Output from previous day: 10/16 0701 - 10/17 0700 In: 360 [P.O.:360] Out: 675 [Urine:675]  Intake/Output this shift: Total I/O In: -  Out: 200 [Urine:200]  Lab Results: Recent Labs    06/14/21 0402 06/15/21 0329 06/16/21 0406  WBC 11.8* 11.9* 9.2  HGB 15.7 13.7 12.5*  HCT 50.5 42.2 38.2*  PLT 203 147* 83*   BMET Recent Labs    06/14/21 0402 06/15/21 0329 06/16/21 0406  NA 133* 129* 131*  K 5.2* 4.5 4.7  CL 95* 90* 92*  CO2 12* 22 26  GLUCOSE 90 119* 102*  BUN 53* 69* 80*  CREATININE 2.11* 2.07* 1.98*  CALCIUM 8.9 9.3 9.3   LFT Recent Labs    06/15/21 0329 06/16/21 0406  PROT 7.0 6.8  ALBUMIN 3.8 3.8  AST 3,936* 4,265*  ALT  2,964* 3,809*  ALKPHOS 55 54  BILITOT 0.8 1.5*   PT/INR Recent Labs    06/15/21 0831  LABPROT 81.4*  INR 10.3*   Hepatitis Panel No results for input(s): HEPBSAG, HCVAB, HEPAIGM, HEPBIGM in the last 72 hours.  Studies/Results: CARDIAC CATHETERIZATION  Result Date: 06/16/2021 Findings: RA = 15 RV =  35/14 PA = 36/17 (25) PCW = 13 Fick cardiac output/index = 2.9/1.8 Thermo CO/CI = 3.1/1.9 PVR = 4.0 WU SVR = 2,166 Ao sat = 99% PA sat = 55%, 56% PaPI = 1.3 Assessment: Cardiogenic shock with R>L heart failure Plan/Discussion: Overall prognosis appears quite poor. Not candidate for advanced therapies. Will start inotropes and assess response. Glori Bickers, MD 1:21 PM  US LIVER DOPPLER  Result Date: 06/15/2021 CLINICAL DATA:  Elevated LFTs EXAM: DUPLEX ULTRASOUND OF LIVER TECHNIQUE: Color and duplex Doppler ultrasound was performed to evaluate the hepatic in-flow and out-flow vessels. COMPARISON:  06/15/2021 FINDINGS: Liver: Normal parenchymal echogenicity. Normal hepatic contour without nodularity. No focal lesion, mass or intrahepatic biliary ductal dilatation. Main Portal Vein size: 1.3 cm Portal Vein Velocities Main Prox:  27 cm/sec Main Mid: 37 cm/sec Main Dist:  33 cm/sec Right: 25 cm/sec Left: 27 cm/sec Hepatic Vein Velocities Right:  90 cm/sec Middle:  61 cm/sec Left:  81 cm/sec IVC: Present and patent with normal respiratory phasicity.  Hepatic Artery Velocity:  101 cm/sec Splenic Vein Velocity:  38 cm/sec Spleen: 5.6 cm x 6.9 cm x 5.0 cm with a total volume of 101 cm^3 (411 cm^3 is upper limit normal) Portal Vein Occlusion/Thrombus: No Splenic Vein Occlusion/Thrombus: No Ascites: Trace ascites in the upper abdomen Varices: None Patent portal, hepatic and splenic veins with normal directional flow. Negative for portal vein occlusion or thrombus. Trace abdominal upper abdominal ascites IMPRESSION: Normal hepatic venous Doppler. Trace upper abdominal ascites Electronically Signed   By: Jerilynn Mages.   Shick M.D.   On: 06/15/2021 11:00   US Abdomen Limited RUQ (LIVER/GB)  Result Date: 06/15/2021 CLINICAL DATA:  Elevated LFTs EXAM: ULTRASOUND ABDOMEN LIMITED RIGHT UPPER QUADRANT COMPARISON:  None. FINDINGS: Gallbladder: Thickened gallbladder wall measuring 1 cm. This may related to chronic hepatic disease, hypoproteinemia or CHF. No elicited Murphy's sign or pericholecystic fluid. No gallstones. Common bile duct: Diameter: 3.8 mm Liver: No focal lesion identified. Within normal limits in parenchymal echogenicity. Portal vein is patent on color Doppler imaging with normal direction of blood flow towards the liver. Other: None. IMPRESSION: Nonspecific gallbladder wall thickening.  See above comment. No definitive signs of cholecystitis No biliary dilatation Electronically Signed   By: Jerilynn Mages.  Shick M.D.   On: 06/15/2021 11:02    Scheduled Meds:  Chlorhexidine Gluconate Cloth  6 each Topical Daily   guaiFENesin  600 mg Oral BID   isosorbide mononitrate  30 mg Oral Daily   polyethylene glycol  17 g Oral Daily   Continuous Infusions:  milrinone 0.25 mcg/kg/min (06/16/21 1321)   norepinephrine (LEVOPHED) Adult infusion     PRN Meds:.albuterol, lidocaine, menthol-cetylpyridinium, nitroGLYCERIN, ondansetron (ZOFRAN) IV, phenol   ASSESMENT:   Elevated LFTs, elevated INR.  Presumed shock liver.  LFTs continue to climb. INR 1.2 >>  10.3 over last 5 days.  06/15/2021 ultrasound abdomen with Dopplers: Other than trace upper abdominal ascites, nonspecific GB wall thickening, this was normal study with 3.8 mm CBD normal Dopplers.   Hep C hx, treated and cured.  Undetectable virus after 12 weeks Epclusa completed in 12/2020 at [facility]/[place] NP.  Fibroscan 08/2020 w F0-F1 fibrosis.  Multiple current labs for r/O other metabollic and immune related liver dz.  LDH elevated.  IgG, smooth muscle Ab, ANA, acute hepatitis panel all pndg.      Acute pulmonary embolus, on heparin infusion then transitioned to Eliquis  10/13.  S/p R thoracentesis to address pleural effusion.  Right heart cath today shows cardiogenic shock, R >> L heart failure, overall poor prognosis.  Dr. Haroldine Laws initiated milrinone.  AKI with baseline stage 3a CKD.  Hyponatremia.  Na 129 >> 131.    Squamous cell esophageal cancer 2010.  Treated with chemo/radiation.  Temporary G-tube.  Subsequently developed throat cancer also treated with chemoradiation.  No recurrence of either malignancy.  Chronic pain treated with chronic fentanyl.   PLAN   Follow LFTs, INR.  Await results of pndg labs.  Ordered soft diet.      Collin Young  06/16/2021, 1:38 PM Phone [phone]    Attending physician's note   I have taken an interval history, reviewed the chart and examined the patient. I agree with the Advanced Practitioner's note, impression and recommendations.   Cardiogenic shock with R>L heart failure. Very limited options (Dr Haroldine Laws) Acute PE Acute liver failure with coagulopathy (INR 10 -> 5.8), likely d/t shock liver. No hepatic encephalopathy. No significant underlying liver disease.  H/O HepC s/p Rx with epclusa with SVR.  Had F0-F1 fibrosis only. Neg acute hep panel (+ HCV Ab expected). [place] neg for Budd-Chiari's. LDH>3000. Multiorgan failure  Plan: -Not any options from liver standpoint except continued monitoring. Liver Bx will not be helpful -Trend LFTs, INR. -Agree with low-dose vitamin K. -Poor prognosis. -D/W Dr Carlis Abbott.   Carmell Austria, MD [facility] [phone]

## 2021-06-17 DIAGNOSIS — R531 Weakness: Secondary | ICD-10-CM | POA: Diagnosis not present

## 2021-06-17 DIAGNOSIS — Z515 Encounter for palliative care: Secondary | ICD-10-CM | POA: Diagnosis not present

## 2021-06-17 DIAGNOSIS — D696 Thrombocytopenia, unspecified: Secondary | ICD-10-CM

## 2021-06-17 DIAGNOSIS — N179 Acute kidney failure, unspecified: Secondary | ICD-10-CM

## 2021-06-17 DIAGNOSIS — Z66 Do not resuscitate: Secondary | ICD-10-CM | POA: Diagnosis not present

## 2021-06-17 DIAGNOSIS — R570 Cardiogenic shock: Secondary | ICD-10-CM | POA: Diagnosis not present

## 2021-06-17 DIAGNOSIS — I2601 Septic pulmonary embolism with acute cor pulmonale: Secondary | ICD-10-CM | POA: Diagnosis not present

## 2021-06-17 DIAGNOSIS — I5021 Acute systolic (congestive) heart failure: Secondary | ICD-10-CM | POA: Diagnosis not present

## 2021-06-17 DIAGNOSIS — R7989 Other specified abnormal findings of blood chemistry: Secondary | ICD-10-CM | POA: Diagnosis not present

## 2021-06-17 DIAGNOSIS — I5023 Acute on chronic systolic (congestive) heart failure: Secondary | ICD-10-CM | POA: Diagnosis not present

## 2021-06-17 DIAGNOSIS — R64 Cachexia: Secondary | ICD-10-CM

## 2021-06-17 LAB — COMPREHENSIVE METABOLIC PANEL WITH GFR
ALT: 3159 U/L — ABNORMAL HIGH (ref 0–44)
AST: 2280 U/L — ABNORMAL HIGH (ref 15–41)
Albumin: 2.9 g/dL — ABNORMAL LOW (ref 3.5–5.0)
Alkaline Phosphatase: 48 U/L (ref 38–126)
Anion gap: 10 (ref 5–15)
BUN: 69 mg/dL — ABNORMAL HIGH (ref 8–23)
CO2: 30 mmol/L (ref 22–32)
Calcium: 8.6 mg/dL — ABNORMAL LOW (ref 8.9–10.3)
Chloride: 93 mmol/L — ABNORMAL LOW (ref 98–111)
Creatinine, Ser: 1.6 mg/dL — ABNORMAL HIGH (ref 0.61–1.24)
GFR, Estimated: 46 mL/min — ABNORMAL LOW
Glucose, Bld: 138 mg/dL — ABNORMAL HIGH (ref 70–99)
Potassium: 3.6 mmol/L (ref 3.5–5.1)
Sodium: 133 mmol/L — ABNORMAL LOW (ref 135–145)
Total Bilirubin: 1.5 mg/dL — ABNORMAL HIGH (ref 0.3–1.2)
Total Protein: 5.7 g/dL — ABNORMAL LOW (ref 6.5–8.1)

## 2021-06-17 LAB — CBC
HCT: 34.5 % — ABNORMAL LOW (ref 39.0–52.0)
Hemoglobin: 11.2 g/dL — ABNORMAL LOW (ref 13.0–17.0)
MCH: 27.5 pg (ref 26.0–34.0)
MCHC: 32.5 g/dL (ref 30.0–36.0)
MCV: 84.8 fL (ref 80.0–100.0)
Platelets: 68 10*3/uL — ABNORMAL LOW (ref 150–400)
RBC: 4.07 MIL/uL — ABNORMAL LOW (ref 4.22–5.81)
RDW: 14.6 % (ref 11.5–15.5)
WBC: 5.4 10*3/uL (ref 4.0–10.5)
nRBC: 1.1 % — ABNORMAL HIGH (ref 0.0–0.2)

## 2021-06-17 LAB — GLUCOSE, CAPILLARY
Glucose-Capillary: 158 mg/dL — ABNORMAL HIGH (ref 70–99)
Glucose-Capillary: 131 mg/dL — ABNORMAL HIGH (ref 70–99)
Glucose-Capillary: 157 mg/dL — ABNORMAL HIGH (ref 70–99)

## 2021-06-17 LAB — ANTI-SMOOTH MUSCLE ANTIBODY, IGG: F-Actin IgG: 8 Units (ref 0–19)

## 2021-06-17 LAB — MAGNESIUM: Magnesium: 2.8 mg/dL — ABNORMAL HIGH (ref 1.7–2.4)

## 2021-06-17 LAB — COOXEMETRY PANEL
Carboxyhemoglobin: 1.3 % (ref 0.5–1.5)
Carboxyhemoglobin: 1.4 % (ref 0.5–1.5)
Carboxyhemoglobin: 1.4 % (ref 0.5–1.5)
Carboxyhemoglobin: 1.5 % (ref 0.5–1.5)
Methemoglobin: 0.6 % (ref 0.0–1.5)
Methemoglobin: 0.6 % (ref 0.0–1.5)
Methemoglobin: 0.7 % (ref 0.0–1.5)
Methemoglobin: 0.7 % (ref 0.0–1.5)
O2 Saturation: 48.3 %
O2 Saturation: 50.2 %
O2 Saturation: 62.9 %
O2 Saturation: 68.6 %
Total hemoglobin: 11 g/dL — ABNORMAL LOW (ref 12.0–16.0)
Total hemoglobin: 11.6 g/dL — ABNORMAL LOW (ref 12.0–16.0)
Total hemoglobin: 11.8 g/dL — ABNORMAL LOW (ref 12.0–16.0)
Total hemoglobin: 12.5 g/dL (ref 12.0–16.0)

## 2021-06-17 LAB — APTT
aPTT: 38 seconds — ABNORMAL HIGH (ref 24–36)
aPTT: 92 seconds — ABNORMAL HIGH (ref 24–36)
aPTT: 119 seconds — ABNORMAL HIGH (ref 24–36)

## 2021-06-17 LAB — ANA: Anti Nuclear Antibody (ANA): POSITIVE — AB

## 2021-06-17 LAB — PROTIME-INR
INR: 3.9 — ABNORMAL HIGH (ref 0.8–1.2)
Prothrombin Time: 38.6 seconds — ABNORMAL HIGH (ref 11.4–15.2)

## 2021-06-17 LAB — IGG: IgG (Immunoglobin G), Serum: 1309 mg/dL (ref 603–1613)

## 2021-06-17 LAB — HEPARIN LEVEL (UNFRACTIONATED): Heparin Unfractionated: >1.1 IU/mL — ABNORMAL HIGH (ref 0.30–0.70)

## 2021-06-17 MED ORDER — POTASSIUM CHLORIDE CRYS ER 20 MEQ PO TBCR: 20.0000 meq | EXTENDED_RELEASE_TABLET | Freq: Once | ORAL | Status: DC

## 2021-06-17 MED ORDER — THIAMINE HCL 100 MG PO TABS
100.0000 mg | ORAL_TABLET | Freq: Every day | ORAL | Status: DC
  Administered 2021-06-17 – 2021-06-24 (×8): 100 mg via ORAL
  Filled 2021-06-17 (×8): qty 1

## 2021-06-17 MED ORDER — POTASSIUM CHLORIDE CRYS ER 20 MEQ PO TBCR
40.0000 meq | EXTENDED_RELEASE_TABLET | Freq: Once | ORAL | Status: AC
  Administered 2021-06-17: 40 meq via ORAL
  Filled 2021-06-17: qty 2

## 2021-06-17 MED ORDER — POTASSIUM CHLORIDE 20 MEQ PO PACK
40.0000 meq | PACK | ORAL | Status: DC
  Administered 2021-06-17: 40 meq via ORAL
  Filled 2021-06-17: qty 2

## 2021-06-17 MED ORDER — ENSURE ENLIVE PO LIQD
237.0000 mL | Freq: Three times a day (TID) | ORAL | Status: DC
  Administered 2021-06-17 – 2021-06-24 (×20): 237 mL via ORAL

## 2021-06-17 MED ORDER — POTASSIUM CHLORIDE CRYS ER 20 MEQ PO TBCR
20.0000 meq | EXTENDED_RELEASE_TABLET | Freq: Once | ORAL | Status: AC
  Administered 2021-06-17: 20 meq via ORAL
  Filled 2021-06-17: qty 1

## 2021-06-17 MED ORDER — HYDRALAZINE HCL 10 MG PO TABS
10.0000 mg | ORAL_TABLET | Freq: Three times a day (TID) | ORAL | Status: DC
  Administered 2021-06-17 – 2021-06-18 (×4): 10 mg via ORAL
  Filled 2021-06-17 (×4): qty 1

## 2021-06-17 MED ORDER — POTASSIUM CHLORIDE CRYS ER 20 MEQ PO TBCR: 40.0000 meq | EXTENDED_RELEASE_TABLET | Freq: Once | ORAL | Status: DC

## 2021-06-17 MED ORDER — ISOSORBIDE MONONITRATE ER 30 MG PO TB24
30.0000 mg | ORAL_TABLET | Freq: Every day | ORAL | Status: DC
  Administered 2021-06-17 – 2021-06-24 (×8): 30 mg via ORAL
  Filled 2021-06-17 (×8): qty 1

## 2021-06-17 MED ORDER — MAGIC MOUTHWASH W/LIDOCAINE
5.0000 mL | Freq: Four times a day (QID) | ORAL | Status: DC
  Administered 2021-06-17 – 2021-06-24 (×27): 5 mL via ORAL
  Filled 2021-06-17 (×33): qty 5

## 2021-06-17 MED ORDER — FUROSEMIDE 10 MG/ML IJ SOLN
80.0000 mg | Freq: Once | INTRAMUSCULAR | Status: AC
  Administered 2021-06-17: 80 mg via INTRAVENOUS
  Filled 2021-06-17: qty 8

## 2021-06-17 MED ORDER — ADULT MULTIVITAMIN LIQUID CH
15.0000 mL | Freq: Every day | ORAL | Status: DC
  Administered 2021-06-17 – 2021-06-24 (×8): 15 mL via ORAL
  Filled 2021-06-17 (×8): qty 15

## 2021-06-17 MED ORDER — POTASSIUM CHLORIDE 20 MEQ PO PACK: 20.0000 meq | PACK | Freq: Once | ORAL | Status: DC

## 2021-06-17 MED ORDER — DIGOXIN 125 MCG PO TABS
0.1250 mg | ORAL_TABLET | Freq: Every day | ORAL | Status: DC
  Administered 2021-06-17 – 2021-06-24 (×8): 0.125 mg via ORAL
  Filled 2021-06-17 (×8): qty 1

## 2021-06-17 MED ORDER — POTASSIUM CHLORIDE CRYS ER 20 MEQ PO TBCR
40.0000 meq | EXTENDED_RELEASE_TABLET | ORAL | Status: DC
  Administered 2021-06-17: 40 meq via ORAL
  Filled 2021-06-17: qty 2

## 2021-06-17 NOTE — Progress Notes (Signed)
[name] is now over in the cardiac ICU.  He has a Swan-Ganz catheter in.  He underwent a catheterization.  He has significant and severe right-sided heart failure.  He is on IV medication right now.  These on milrinone.  Looks like his liver test might be getting better.  His INR is down to 3.9.  So hopefully the liver is starting to recover a little bit.  He still not eating much.  He still having some difficulties swallowing.  I will know he has a stricture.  I think he has had this in the past from his past radiation.  He may need to have GI look at him and see if there is any type of stricture.  I realize this might be a little bit tricky given his cardiac failure and his liver insufficiency and the coagulopathy from his hepatic failure.  He and I did have a long talk this morning.  We talked about his outlook.  I told him that what is being done is likely just temporary.  Typically with right-sided heart failure, there is really no good therapy for this.  Medication can be a temporary fix.  I think he does understand this.  His faith is incredibly strong.  He certainly is not afraid to pass on because he knows where he is going.  He is going to think about being put through aggressive measures for life support.  Currently, he is on heparin.  He does have some thrombocytopenia which I think is from his hepatic failure.  He actually looks better than when I saw him yesterday.  I know that the staff in the cardiac ICU will do a fantastic job with him.  Lattie Haw, MD  Psalm 23:4

## 2021-06-17 NOTE — Progress Notes (Signed)
Advanced Heart Failure Rounding Note   Subjective:    Had swan placed yesterday. Now on milrinone 0.5. NE off for now.   Co-ox this am 69% -> 48%  Swan #s RA 15 PA 43/25 Thermo 3.1/1.9   Feels a bit better. But still weak. No CP, SOB, orthopnea or PND. LFTs trending down. On heparin for PE.    Objective:   Weight Range:  Vital Signs:   Temp:  [97 F (36.1 C)-98.4 F (36.9 C)] 97.3 F (36.3 C) (10/18 0700) Pulse Rate:  [48-102] 90 (10/18 0700) Resp:  [0-27] 11 (10/18 0700) BP: (105-168)/(58-134) 143/82 (10/18 0700) SpO2:  [84 %-100 %] 95 % (10/18 0700) Weight:  [57.6 kg] 57.6 kg (10/18 0630) Last BM Date: 06/16/21  Weight change: Filed Weights   06/11/21 0109 06/12/21 0439 06/17/21 0630  Weight: 62 kg 52.7 kg 57.6 kg    Intake/Output:   Intake/Output Summary (Last 24 hours) at 06/17/2021 0750 Last data filed at 06/17/2021 0700 Gross per 24 hour  Intake 854.66 ml  Output 995 ml  Net -140.34 ml     Physical Exam: General:  Thin, elderly. Weak appearing. No resp difficulty HEENT: normal Neck: supple. RIJ swan Carotids 2+ bilat; no bruits. No lymphadenopathy or thryomegaly appreciated. Cor: PMI nondisplaced. Regular rate & rhythm. No rubs, gallops or murmurs. Lungs: clear Abdomen: soft, nontender, nondistended. No hepatosplenomegaly. No bruits or masses. Good bowel sounds. Extremities: no cyanosis, clubbing, rash, edema cachetic Neuro: alert & orientedx3, cranial nerves grossly intact. moves all 4 extremities w/o difficulty. Affect pleasant  Telemetry: Sinus 80-90s Personally reviewed   Labs: Basic Metabolic Panel: Recent Labs  Lab 06/13/21 1014 06/14/21 0402 06/15/21 0329 06/16/21 0406 06/16/21 1151 06/17/21 0443  NA 131* 133* 129* 131* 133*  135 133*  K 3.9 5.2* 4.5 4.7 4.2  3.9 3.6  CL 94* 95* 90* 92*  --  93*  CO2 25 12* 22 26  --  30  GLUCOSE 164* 90 119* 102*  --  138*  BUN 41* 53* 69* 80*  --  69*  CREATININE 1.90* 2.11* 2.07*  1.98*  --  1.60*  CALCIUM 8.9 8.9 9.3 9.3  --  8.6*  MG  --   --  3.0* 3.0*  --  2.8*    Liver Function Tests: Recent Labs  Lab 06/11/21 0225 06/12/21 0327 06/15/21 0329 06/16/21 0406 06/17/21 0443  AST 31 22 3,936* 4,265* 2,280*  ALT 18 15 2,964* 3,809* 3,159*  ALKPHOS 38 34* 55 54 48  BILITOT 0.7 0.6 0.8 1.5* 1.5*  PROT 7.4 6.7 7.0 6.8 5.7*  ALBUMIN 4.0 3.5 3.8 3.8 2.9*   No results for input(s): LIPASE, AMYLASE in the last 168 hours. No results for input(s): AMMONIA in the last 168 hours.  CBC: Recent Labs  Lab 06/11/21 0225 06/12/21 0327 06/13/21 0319 06/14/21 0402 06/15/21 0329 06/16/21 0406 06/16/21 1151 06/17/21 0446  WBC 4.2   < > 7.0 11.8* 11.9* 9.2  --  5.4  NEUTROABS 3.2  --   --   --   --   --   --   --   HGB 13.0   < > 13.5 15.7 13.7 12.5* 13.3  13.3 11.2*  HCT 41.0   < > 42.5 50.5 42.2 38.2* 39.0  39.0 34.5*  MCV 85.2   < > 85.9 87.8 84.4 83.6  --  84.8  PLT 207   < > 208 203 147* 83*  --  68*   < > =  values in this interval not displayed.    Cardiac Enzymes: No results for input(s): CKTOTAL, CKMB, CKMBINDEX, TROPONINI in the last 168 hours.  BNP: BNP (last 3 results) Recent Labs    05/11/21 0523 06/02/21 0311 06/11/21 0225  BNP 1,797.3* 2,342.9* >4,500.0*    ProBNP (last 3 results) Recent Labs    05/14/21 0935  PROBNP 4,378.0*      Other results:  Imaging: CARDIAC CATHETERIZATION  Result Date: 06/16/2021 Findings: RA = 15 RV =  35/14 PA = 36/17 (25) PCW = 13 Fick cardiac output/index = 2.9/1.8 Thermo CO/CI = 3.1/1.9 PVR = 4.0 WU SVR = 2,166 Ao sat = 99% PA sat = 55%, 56% PaPI = 1.3 Assessment: Cardiogenic shock with R>L heart failure Plan/Discussion: Overall prognosis appears quite poor. Not candidate for advanced therapies. Will start inotropes and assess response. Glori Bickers, MD 1:21 PM  US LIVER DOPPLER  Result Date: 06/15/2021 CLINICAL DATA:  Elevated LFTs EXAM: DUPLEX ULTRASOUND OF LIVER TECHNIQUE: Color and  duplex Doppler ultrasound was performed to evaluate the hepatic in-flow and out-flow vessels. COMPARISON:  06/15/2021 FINDINGS: Liver: Normal parenchymal echogenicity. Normal hepatic contour without nodularity. No focal lesion, mass or intrahepatic biliary ductal dilatation. Main Portal Vein size: 1.3 cm Portal Vein Velocities Main Prox:  27 cm/sec Main Mid: 37 cm/sec Main Dist:  33 cm/sec Right: 25 cm/sec Left: 27 cm/sec Hepatic Vein Velocities Right:  90 cm/sec Middle:  61 cm/sec Left:  81 cm/sec IVC: Present and patent with normal respiratory phasicity. Hepatic Artery Velocity:  101 cm/sec Splenic Vein Velocity:  38 cm/sec Spleen: 5.6 cm x 6.9 cm x 5.0 cm with a total volume of 101 cm^3 (411 cm^3 is upper limit normal) Portal Vein Occlusion/Thrombus: No Splenic Vein Occlusion/Thrombus: No Ascites: Trace ascites in the upper abdomen Varices: None Patent portal, hepatic and splenic veins with normal directional flow. Negative for portal vein occlusion or thrombus. Trace abdominal upper abdominal ascites IMPRESSION: Normal hepatic venous Doppler. Trace upper abdominal ascites Electronically Signed   By: Jerilynn Mages.  Shick M.D.   On: 06/15/2021 11:00   US Abdomen Limited RUQ (LIVER/GB)  Result Date: 06/15/2021 CLINICAL DATA:  Elevated LFTs EXAM: ULTRASOUND ABDOMEN LIMITED RIGHT UPPER QUADRANT COMPARISON:  None. FINDINGS: Gallbladder: Thickened gallbladder wall measuring 1 cm. This may related to chronic hepatic disease, hypoproteinemia or CHF. No elicited Murphy's sign or pericholecystic fluid. No gallstones. Common bile duct: Diameter: 3.8 mm Liver: No focal lesion identified. Within normal limits in parenchymal echogenicity. Portal vein is patent on color Doppler imaging with normal direction of blood flow towards the liver. Other: None. IMPRESSION: Nonspecific gallbladder wall thickening.  See above comment. No definitive signs of cholecystitis No biliary dilatation Electronically Signed   By: Jerilynn Mages.  Shick M.D.   On:  06/15/2021 11:02     Medications:     Scheduled Medications:  Chlorhexidine Gluconate Cloth  6 each Topical Daily   guaiFENesin  600 mg Oral BID   nystatin  5 mL Oral QID   polyethylene glycol  17 g Oral Daily   potassium chloride  40 mEq Oral Q4H    Infusions:  ceFEPime (MAXIPIME) IV Stopped (06/16/21 1703)   heparin 1,100 Units/hr (06/17/21 0700)   milrinone 0.5 mcg/kg/min (06/17/21 0700)   norepinephrine (LEVOPHED) Adult infusion      PRN Medications: acetaminophen (TYLENOL) oral liquid 160 mg/5 mL, albuterol, lidocaine, menthol-cetylpyridinium, nitroGLYCERIN, ondansetron (ZOFRAN) IV, phenol   Assessment/Plan:   1. Acute on Chronic Systolic Heart Failure -> Cardiogenic Shock  -  Echo 2018 EF 10-15%, RV moderately reduced. LHC w/ MVCAD management medically - Echo 05/2017, EF improved up to 45-50%, Mod AI. RV normal.   - Echo 05/2020 EF down again, 35-40%. RV normal  - Echo 05/2021 EF down 25-30%, RV not well visualized. Mod MR and Mod AI. - Limited Echo 10/22: EF 20%, RV normal  - Admitted w/ a/c CHF w/ cardiogenic shock w/ AKI + Shock liver - RHC 06/16/21 w/ R>L HF and low output, CI 1.8. PAPi 1.3  - Now on milrinone 0.25 with improvement in symptoms but not much change in [place] #s - Will diuresis with IV lasix today - Overall prognosis appears quite poor. Not candidate for advanced therapies - Need to discuss [place]   2. Acute Pulmonary Embolism  - CT w/ rt lower lobe PE  - Limited echo w/ no RV strain  - Suspect incidental due HF - continue heparin   3. Shock Liver - due to shock. Improving with inotropes   4. AKI on Stage IIIa - Baseline SCr 1.3 - Bumped to 2.11 -> improved to 1.6 with support - Cardiorenal  - continue milrinone   5. Large Rt Pleural Effusion  - s/p thoracentesis by CCM 10/12 - 1200 cc removed, transudative    6. H/o esophageal CA and throat CA - s/p chemo and XRT  7. Thrombocytopenia - PLTs falling - no bleeding - if continue to fall  will check HIT  CRITICAL CARE Performed by: Glori Bickers  Total critical care time: 45 minutes  Critical care time was exclusive of separately billable procedures and treating other patients.  Critical care was necessary to treat or prevent imminent or life-threatening deterioration.  Critical care was time spent personally by me (independent of midlevel providers or residents) on the following activities: development of treatment plan with patient and/or surrogate as well as nursing, discussions with consultants, evaluation of patient's response to treatment, examination of patient, obtaining history from patient or surrogate, ordering and performing treatments and interventions, ordering and review of laboratory studies, ordering and review of radiographic studies, pulse oximetry and re-evaluation of patient's condition.   Length of Stay: 6   Glori Bickers MD 06/17/2021, 7:50 AM  Advanced Heart Failure Team Pager [phone] (M-F; [place])  Please contact [facility] for night-coverage after hours (4p -7a ) and weekends on [email or web address]

## 2021-06-17 NOTE — Progress Notes (Signed)
ANTICOAGULATION CONSULT NOTE - Follow Up Consult  Pharmacy Consult for heparin Indication: pulmonary embolus  Labs: Recent Labs    06/14/21 0402 06/15/21 0329 06/15/21 0831 06/16/21 0406 06/16/21 1151 06/16/21 1309 06/16/21 2314  HGB 15.7 13.7  --  12.5* 13.3  13.3  --   --   HCT 50.5 42.2  --  38.2* 39.0  39.0  --   --   PLT 203 147*  --  83*  --   --   --   APTT  --   --   --   --   --  32 38*  LABPROT  --   --  81.4*  --   --  51.9*  --   INR  --   --  10.3*  --   --  5.8*  --   CREATININE 2.11* 2.07*  --  1.98*  --   --   --     Assessment: 70yo male subtherapeutic on heparin after resuming heparin; no infusion issues or signs of bleeding per RN.  Goal of Therapy:  aPTT 66-102 seconds   Plan:  Will increase heparin infusion to previously therapeutic rate of 1100 units/hr and check PTT in 8 hours.    Wynona Neat, PharmD, BCPS  06/17/2021,2:32 AM

## 2021-06-17 NOTE — Consult Note (Addendum)
+                                                                                   Consultation Note Date: 06/17/2021   Patient Name: Collin Young  DOB: 04-14-1951  MRN: [id number]  Age / Sex: 70 y.o., male  PCP: Copland, Gay Filler, MD Referring Physician: Julian Hy, DO  Reason for Consultation: Establishing goals of care  HPI/Patient Profile: 70 y.o. male  admitted on 06/11/2021 with a PMH significant for systolic and diastolic CHF, COPD, prior smoker, CAD, esophageal and prostate cancer s/p chemo and radiation, GERD, prior ETOH abuse, seizures, HTN, and HLD who presented to the ED  for complaints of shortness of breath.    On ED admit patient was treated for acute COPD exacerbation   CTA chest was obtained that revealed lobar/subsegmental PE's on right with large right and small left pleural effusions.    Of note patient was recently admitted at this facility 10/3 - 10/4 for similar presented and was treated for acute CHF exacerbation.    Patient lives alone.  Admitted for treatment and stabilization.   Patient faces treatment option decisions, advanced directive decisions and anticipatory care needs.   Clinical Assessment and Goals of Care:   This NP Wadie Lessen reviewed medical records, received report from team, assessed the patient and then meet at the patient's bedside, along with his daughter/ Mardee Postin  on phone conference, to discuss diagnosis, prognosis, GOC, EOL wishes disposition and options.   Concept of Palliative Care was introduced as specialized medical care for people and their families living with serious illness.  If focuses on providing relief from the symptoms and stress of a serious illness.  The goal is to improve quality of life for both the patient and the family.  Values and goals of care important to patient and family were attempted to be elicited.  Created space and opportunity for patient  and daughter to explore thoughts and feelings  regarding current medical situation.  Both verbalize an understanding of the complexity of his medical situation secondary to multiple comorbidities.  Both verbalize openness to all offered and available medical interventions to prolong life at this time.   Hope is for patient to return home independently. I worry that patient and his family do not realize his high risk for decompensation and the limitation of medical interventions to prolong quality of life when a  body fails to thrive.    A  discussion was had today regarding advanced directives.  Concepts specific to code status, artifical feeding and hydration, continued IV antibiotics and rehospitalization was had.     MOST form introduced, Hard Choices booklet left for review   Questions and concerns addressed.  Patient  encouraged to call with questions or concerns.     PMT will continue to support holistically.          No documented H POA however patient verbalizes that he would like his daughter Davis Gourd to be his main medical decision maker in the event he cannot make his own decisions.  Offered support of spiritual care to establish and document H POA, patient and daughter  in agreement.       SUMMARY OF RECOMMENDATIONS    Code Status/Advance Care Planning:  Limited code- documented today, discussed with both patient and his daughter     -No compression, and no shock, no intubation Re-meet with daughter at bedside on Sunday October 23rd  afternoon at 2pm   Symptom Management:  Per attending  Palliative Prophylaxis:  Aspiration, Bowel Regimen, Delirium Protocol, Frequent Pain Assessment, and Oral Care  Additional Recommendations (Limitations, Scope, Preferences): Full Scope Treatment  Psycho-social/Spiritual:  Desire for further Chaplaincy support:yes Therapeutic listening and emotional support offered  Prognosis:  Unable to determine  Discharge Planning: To Be Determined      Primary  Diagnoses: Present on Admission:  Pulmonary embolism ([place])   I have reviewed the medical record, interviewed the patient and family, and examined the patient. The following aspects are pertinent.  Past Medical History:  Diagnosis Date   Chronic combined systolic and diastolic CHF (congestive heart failure) ([place])    followed by [name]   COPD (chronic obstructive pulmonary disease) ([place])    Coronary artery disease cardiologist-- [name]   per cardiac cath 01-05-2017  chronic total occlusion pLCx with collaterals, 99% severe calcified prox. to mid RCA, otherwise mild to moderate CAD (medically managed)   Esophageal cancer, stage IIIB [facility]) oncologist-  dr ennever/  dr moody   dx 2010  SCC Stage IIIB completed chemoradiation;  localized recurrent left piriform sinus 01/ 2015,  completed concurrent chemoradiatoin 04/ 2015   GERD (gastroesophageal reflux disease)    09-07-2018   no issues since Gtube removed 06/ 2019   History of alcohol abuse    quit 2001   History of cancer chemotherapy    2010;   2015   History of radiation therapy    10-19-2013 to  12-05-2013 pyriform sinus 69.96 Gy/39fx;   Radiation completed 2010 for esophageal cancer   History of seizure 2001   alcohol withdrawal   HOH (hard of hearing)    Hyperlipidemia    Hypertension    Ischemic cardiomyopathy 12/2016   01-03-2017  echo,  ef 10-15%/   echo 05-06-2017 EF improved to 45-50%   Prostate cancer [facility]) urologist-  dr ottelin/  oncologist-- [name]   dx 05-27-2018--- Stage T1c,  Gleason 4+3,  PSA 9.3--  scheduled for brachytherapy 09-23-2017   Renal cyst, left    Social History   Socioeconomic History   Marital status: Legally Separated    Spouse name: Not on file   Number of children: 2   Years of education: Not on file   Highest education level: Not on file  Occupational History   Occupation: retired  Tobacco Use   Smoking status: Former    Packs/day: 1.00    Years: 40.00    Pack  years: 40.00    Types: Cigarettes    Start date: 11/08/1960    Quit date: 08/31/2009    Years since quitting: 11.8   Smokeless tobacco: Former    Types: Chew    Quit date: 09/01/1999  Vaping Use   Vaping Use: Never used  Substance and Sexual Activity   Alcohol use: Not Currently    Alcohol/week: 0.0 standard drinks    Comment: quit in 2001   Drug use: No    Comment: back in the day used cocaine,alcohol, marijuana   Sexual activity: Yes    Partners: Female    Birth control/protection: None  Other Topics Concern   Not on file  Social History Narrative   Not on file   Social Determinants of Health   Financial Resource Strain: Not on file  Food Insecurity: Not on file  Transportation Needs: Not on file  Physical Activity: Not on file  Stress: Not on file  Social Connections: Not on file   Family History  Problem Relation Age of Onset   Breast cancer Mother    Prostate cancer Neg Hx    Colon cancer Neg Hx    Pancreatic cancer Neg Hx    Scheduled Meds:  Chlorhexidine Gluconate Cloth  6 each Topical Daily   digoxin  0.125 mg Oral Daily   feeding supplement  237 mL Oral TID BM   guaiFENesin  600 mg Oral BID   hydrALAZINE  10 mg Oral Q8H   isosorbide mononitrate  30 mg Oral Daily   magic mouthwash w/lidocaine  5 mL Oral QID   multivitamin  15 mL Oral Daily   polyethylene glycol  17 g Oral Daily   potassium chloride  20 mEq Oral Once   potassium chloride  40 mEq Oral Once   thiamine  100 mg Oral Daily   Continuous Infusions:  ceFEPime (MAXIPIME) IV Stopped (06/16/21 1703)   heparin 1,100 Units/hr (06/17/21 1100)   milrinone 0.5 mcg/kg/min (06/17/21 1100)   norepinephrine (LEVOPHED) Adult infusion     PRN Meds:.acetaminophen (TYLENOL) oral liquid 160 mg/5 mL, albuterol, lidocaine, menthol-cetylpyridinium, nitroGLYCERIN, ondansetron (ZOFRAN) IV, phenol Medications Prior to Admission:  Prior to Admission medications   Medication Sig Start Date End Date Taking? Authorizing  Provider  albuterol (VENTOLIN HFA) 108 (90 Base) MCG/ACT inhaler INHALE 2 PUFFS INTO THE LUNGS EVERY 6 HOURS AS NEEDED FOR WHEEZING OR SHORTNESS OF BREATH Patient taking differently: Inhale 2 puffs into the lungs every 6 (six) hours as needed for wheezing or shortness of breath. 03/28/21  Yes Copland, Gay Filler, MD  BAYER LOW DOSE 81 MG EC tablet Take 81 mg by mouth daily. Swallow whole.   Yes [provider]  dapagliflozin propanediol (FARXIGA) 10 MG TABS tablet Take 1 tablet (10 mg total) by mouth daily. 05/12/21  Yes Georgette Shell, MD  ENTRESTO 24-26 MG TAKE 1 TABLET BY MOUTH TWICE DAILY Patient taking differently: Take 0.5 tablets by mouth 2 (two) times daily. 11/11/20  Yes Skeet Latch, MD  Evolocumab (REPATHA SURECLICK) 941 MG/ML SOAJ Inject 140 mg into the skin every 14 (fourteen) days. 11/14/20  Yes Skeet Latch, MD  fentaNYL (DURAGESIC) 12 MCG/HR Place 1 patch onto the skin every 3 (three) days. 06/04/21  Yes [name], MD  furosemide (LASIX) 40 MG tablet Take 40 mg of Lasix if you gain weight more than 2 pounds per day or more than 5 pounds per week Patient taking differently: Take 40 mg by mouth every Monday, Wednesday, and Friday. 05/11/21  Yes Georgette Shell, MD  Multiple Vitamin (MULTIVITAMIN) tablet Take 1 tablet by mouth 3 (three) times a week.   Yes [provider]  polyethylene glycol (MIRALAX / GLYCOLAX) 17 g packet Take 17 g by mouth daily as needed for mild constipation. Patient taking differently: Take 17 g by mouth daily as needed for mild constipation (MIX AND DRINK AS DIRECTED). 05/11/21  Yes Georgette Shell, MD  pravastatin (PRAVACHOL) 80 MG tablet Take 1 tablet (80 mg total) by mouth every evening. 04/30/21 07/29/21 Yes Skeet Latch, MD  metoprolol succinate (TOPROL-XL) 25 MG 24 hr tablet Take 0.5 tablets (12.5 mg total) by mouth daily. Patient not  taking: Reported on 06/11/2021 05/12/21   Georgette Shell, MD   No  Known Allergies Review of Systems  Gastrointestinal:        "Hard to swallow"  Neurological:  Positive for weakness.   Physical Exam Constitutional:      Appearance: He is cachectic. He is ill-appearing.  Cardiovascular:     Rate and Rhythm: Normal rate.  Skin:    General: Skin is warm and dry.  Neurological:     Mental Status: He is alert and oriented to person, place, and time.    Vital Signs: BP 128/90   Pulse 97   Temp (!) 97.2 F (36.2 C)   Resp 17   Ht 5\' 6"  (1.676 m)   Wt 57.6 kg   SpO2 90%   BMI 20.50 kg/m  Pain Scale: 0-10 POSS *See Group Information*: S-Acceptable,Sleep, easy to arouse Pain Score: Asleep   SpO2: SpO2: 90 % O2 Device:SpO2: 90 % O2 Flow Rate: .O2 Flow Rate (L/min): 2 L/min  IO: Intake/output summary:  Intake/Output Summary (Last 24 hours) at 06/17/2021 1216 Last data filed at 06/17/2021 1100 Gross per 24 hour  Intake 1290.4 ml  Output 795 ml  Net 495.4 ml    LBM: Last BM Date: 06/16/21 Baseline Weight: Weight: 62 kg Most recent weight: Weight: 57.6 kg     Palliative Assessment/Data:  40 % at best   Discussed with Dr  Carlis Abbott via secure chat   and bedside RN   Time In: 1300 Time Out: 1415 Time Total: 75 minutes Greater than 50%  of this time was spent counseling and coordinating care related to the above assessment and plan.  Signed by: Wadie Lessen, NP   Please contact Palliative Medicine Team phone at [phone] for questions and concerns.  For individual provider: See Shea Evans

## 2021-06-17 NOTE — Progress Notes (Signed)
ANTICOAGULATION CONSULT NOTE  Pharmacy Consult for heparin Indication: pulmonary embolus  No Known Allergies  Patient Measurements: Height: 5\' 6"  (167.6 cm) Weight: 57.6 kg (126 lb 15.8 oz) IBW/kg (Calculated) : 63.8 Heparin Dosing Weight: 52.7 kg  Vital Signs: Temp: 98.2 F (36.8 C) (10/18 1900) BP: 130/72 (10/18 1800) Pulse Rate: 101 (10/18 1900)  Labs: Recent Labs    06/15/21 0329 06/15/21 0831 06/16/21 0406 06/16/21 1151 06/16/21 1309 06/16/21 1309 06/16/21 2314 06/17/21 0443 06/17/21 0446 06/17/21 1000 06/17/21 1039 06/17/21 1800  HGB 13.7  --  12.5* 13.3  13.3  --   --   --   --  11.2*  --   --   --   HCT 42.2  --  38.2* 39.0  39.0  --   --   --   --  34.5*  --   --   --   PLT 147*  --  83*  --   --   --   --   --  68*  --   --   --   APTT  --   --   --   --  32   < > 38*  --   --   --  92* 119*  LABPROT  --  81.4*  --   --  51.9*  --   --   --  38.6*  --   --   --   INR  --  10.3*  --   --  5.8*  --   --   --  3.9*  --   --   --   HEPARINUNFRC  --   --   --   --   --   --   --   --   --  >1.10*  --   --   CREATININE 2.07*  --  1.98*  --   --   --   --  1.60*  --   --   --   --    < > = values in this interval not displayed.     Estimated Creatinine Clearance: 35 mL/min (A) (by C-G formula based on SCr of 1.6 mg/dL (H)).    Medications:   Scheduled:   Infusions:   ceFEPime (MAXIPIME) IV Stopped (06/17/21 1701)   heparin 1,100 Units/hr (06/17/21 1900)   milrinone 0.5 mcg/kg/min (06/17/21 1900)   norepinephrine (LEVOPHED) Adult infusion      Assessment: Patient is a 70 year old male who presented with dyspnea, found to have an acute pulmonary embolism. Patient was initially started on heparin at [facility] where he was then switched to apixaban. Last apixaban dose on 10/16. Shock liver may also be contributing as AST/ALT are significantly elevated.INR decreased from 10.3 on 10/16 to 3.9 on 10/18. Pharmacy consulted to dose heparin.  Platelets declined gradually to 68k. Hemoglobin and hematocrit trending down to 11.2 and 34.5 respectively.  APTT up to 119 seconds this evening.  No overt bleeding or complications noted.  Goal of Therapy:  Heparin level 0.3-0.7 units/ml Monitor platelets by anticoagulation protocol: Yes   Plan:  Decrease IV heparin to 1000 units/hr. Check 8 hour confirmatory aPTT Daily CBC, aPTT, and Heparin level  Thank you for allowing pharmacy to participate in this patient's care.  [place] Crane, Roylene Reason, BCCP Clinical Pharmacist  06/17/2021 7:51 PM   [facility] [place] pharmacy phone numbers are listed on [email or web address]

## 2021-06-17 NOTE — Progress Notes (Signed)
NAME:  Collin Young, MRN:  [id number], DOB:  03/27/1951, LOS: 6 ADMISSION DATE:  06/11/2021, CONSULTATION DATE:  06/12/21 REFERRING MD:  Dr Marylyn Ishihara, CHIEF COMPLAINT: Pleural effusion  History of Present Illness:  Collin Young is a 70 y.o. male with a PMH significant for systolic and diastolic CHF, COPD, prior smoker, CAD, esophageal and prostate cancer s/p chemo and radiation, GERD, prior ETOH abuse, seizures, HTN, and HLD who presented to the ED  for complaints of shortness of breath.    On ED admit patient was treated for acute COPD exacerbation with BDs and supplemental oxygen. CTA chest was obtained that revealed lobar/subsegmental PE's on right with large right and small left pleural effusions. Given PE and pleural effusion PCCM consult for pulmonary eval.    Of note patient was recently admitted at this facility 10/3 - 10/4 for similar presented and was treated for acute CHF exacerbation.  10/17 PCCM re-consulted   Pertinent  Medical History  Combined systolic and congestive heart failure  COPD CAD Esophageal cancer  GERD Remote ETOH and tobacco use  Hx of seizures  HLD HTN Prostate cancer  Significant Hospital Events:  10/12 Admitted with shortness of breath, underwent thoracentesis with 1.2L clear yellow fluid removed - trasnudative by lights   10/13 cardiology consulted with recommendation to increased diuresing, note LFTs normal    10/15 creatinine increased to 2.11 with decreased urine output, also developed chest pain with frequent PVC 10/16 severally elevated LFTs suggesting acute shock liver  10/16  cytology with reactive mesothelial cells  10/17 transferred to [facility] for heart cath  Cath with cardiogenic shock with R > L heart failure   Interim History / Subjective:  Collin Young complains of sore throat and has had difficulty with taking pills this morning.  He has had minimal appetite and hasn't been eating much.  Objective   Blood pressure 134/68,  pulse 93, temperature (!) 97 F (36.1 C), resp. rate 19, height 5\' 6"  (1.676 m), weight 57.6 kg, SpO2 96 %. PAP: (27-59)/(8-36) 46/21 CVP:  [3 mmHg-35 mmHg] 20 mmHg PCWP:  [16 mmHg-20 mmHg] 20 mmHg CO:  [2 L/min-3.7 L/min] 3.6 L/min CI:  [1.2 L/min/m2-2.2 L/min/m2] 2.2 L/min/m2      Intake/Output Summary (Last 24 hours) at 06/17/2021 0930 Last data filed at 06/17/2021 0800 Gross per 24 hour  Intake 1233.59 ml  Output 795 ml  Net 438.59 ml    Filed Weights   06/11/21 0109 06/12/21 0439 06/17/21 0630  Weight: 62 kg 52.7 kg 57.6 kg    Examination: General: frail elderly man sitting up in the chair in NAD HEENT: temporal wasting, oral mucosa moist, chronic hoarseness Neuro: awake and alert, moving all extremities, answering questions appropriately. Hard of hearing. CV: S1S2S3, reg rate and rhythm. JVD+ PULM:  breathing comfortably on [place], basilar rhales. GI: soft, ND Extremities: no LE edema, no cyanosis Skin: warm, dry, no rashes  Coox 48 Na+ 133 BUN 69 Cr 1.6 AST 2280 ALT 3159 T bili 1.5 H/H 11.2/34.5 Platelets 68 INR 3.9  RA 15 PA 43/25 Thermo 3.1/1.9   Resolved Hospital Problem list     Assessment & Plan:   Cardiogenic shock with acute biventricular HFrEF Acute PE likely precipitating acute RV failure ICM, CAD -con't heparin infusion; may need to check HIT and switch to bival or switch to DOAC once liver function recovers -con't milrinone 0.57mcg -daily coox -con't Imdur, hydralazine   -con't swan for serial hemodynamics -Ongoing GOC discussions as he  progresses. He is not a candidate for invasive support therapies. He wants his previous QOL back and is eager to go home, but understands this may not be possible.  Would recommend against adding other advanced therapies (ie vent) if he develops worse MOF.  Acute liver injury due to congestive hepatopathy with ascites Coagulopathy due to liver injury -daily LFTs -con't milrinone and digoxin to optimize  cardiac function -daily INR -avoid hepatically metabolized medications  Thrombocytopenia Acute anemia -may need to check HIT panel -transfuse for Hb<7 or hemodynamically significant bleeding  AKI, suspect pre-renal due to low flow state. Baseline CKDIIIa -milrinone to optimize perfusion -lasix today -renally dose meds, avoid nephrotoxic meds -monitor daily  Acute respiratory failure due to acute pulm edema and R pleural effusion Possible pneumonia -milrinone gtt -lasix -continue empiric antibiotics due to concern for HAP- cefepime  Hyperglycemia -SSI PRN  Hyponatremia due to heart failure -monitor -goal BG <180  Severe protein energy malnutrition -ensure TID -encourage frequent snacks -MVI, thiamine  History of esophageal, piriform sinus throat, and prostate cancers -OP management  GoC -Daughter updated at bedside. She understands that he is very ill and may not be able to recover from this. We will continue to assess daily. -Palliative care consult.   Best Practice (right click and "Reselect all SmartList Selections" daily)   Diet/type: Regular consistency (see orders) DVT prophylaxis: systemic heparin GI prophylaxis: PPI Lines: Central line Foley:  Yes, and it is still needed Code Status:  full code Last date of multidisciplinary goals of care discussion: 10/18- daughter updated at bedside, full scope for now, but understanding that there are limitations and he wants to go back to his previous QOL  Critical care time:       This patient is critically ill with multiple organ system failure which requires frequent high complexity decision making, assessment, support, evaluation, and titration of therapies. This was completed through the application of advanced monitoring technologies and extensive interpretation of multiple databases. During this encounter critical care time was devoted to patient care services described in this note for 45 minutes.  Julian Hy, DO 06/17/21 10:26 AM [facility]

## 2021-06-17 NOTE — Progress Notes (Addendum)
ANTICOAGULATION CONSULT NOTE - Initial Consult  Pharmacy Consult for heparin Indication: pulmonary embolus  No Known Allergies  Patient Measurements: Height: 5\' 6"  (167.6 cm) Weight: 57.6 kg (126 lb 15.8 oz) IBW/kg (Calculated) : 63.8 Heparin Dosing Weight: 52.7 kg  Vital Signs: Temp: 97.2 F (36.2 C) (10/18 1100) Temp Source: Core (10/18 0400) BP: 128/90 (10/18 1100) Pulse Rate: 97 (10/18 1100)  Labs: Recent Labs    06/15/21 0329 06/15/21 0831 06/16/21 0406 06/16/21 1151 06/16/21 1309 06/16/21 2314 06/17/21 0443 06/17/21 0446 06/17/21 1000 06/17/21 1039  HGB 13.7  --  12.5* 13.3  13.3  --   --   --  11.2*  --   --   HCT 42.2  --  38.2* 39.0  39.0  --   --   --  34.5*  --   --   PLT 147*  --  83*  --   --   --   --  68*  --   --   APTT  --   --   --   --  32 38*  --   --   --  92*  LABPROT  --  81.4*  --   --  51.9*  --   --  38.6*  --   --   INR  --  10.3*  --   --  5.8*  --   --  3.9*  --   --   HEPARINUNFRC  --   --   --   --   --   --   --   --  >1.10*  --   CREATININE 2.07*  --  1.98*  --   --   --  1.60*  --   --   --      Estimated Creatinine Clearance: 35 mL/min (A) (by C-G formula based on SCr of 1.6 mg/dL (H)).   Medical History: Past Medical History:  Diagnosis Date   Chronic combined systolic and diastolic CHF (congestive heart failure) ([place])    followed by [name]   COPD (chronic obstructive pulmonary disease) ([place])    Coronary artery disease cardiologist-- [name]   per cardiac cath 01-05-2017  chronic total occlusion pLCx with collaterals, 99% severe calcified prox. to mid RCA, otherwise mild to moderate CAD (medically managed)   Esophageal cancer, stage IIIB [facility]) oncologist-  dr ennever/  dr moody   dx 2010  SCC Stage IIIB completed chemoradiation;  localized recurrent left piriform sinus 01/ 2015,  completed concurrent chemoradiatoin 04/ 2015   GERD (gastroesophageal reflux disease)    09-07-2018   no issues since Gtube  removed 06/ 2019   History of alcohol abuse    quit 2001   History of cancer chemotherapy    2010;   2015   History of radiation therapy    10-19-2013 to  12-05-2013 pyriform sinus 69.96 Gy/82fx;   Radiation completed 2010 for esophageal cancer   History of seizure 2001   alcohol withdrawal   HOH (hard of hearing)    Hyperlipidemia    Hypertension    Ischemic cardiomyopathy 12/2016   01-03-2017  echo,  ef 10-15%/   echo 05-06-2017 EF improved to 45-50%   Prostate cancer [facility]) urologist-  dr ottelin/  oncologist-- [name]   dx 05-27-2018--- Stage T1c,  Gleason 4+3,  PSA 9.3--  scheduled for brachytherapy 09-23-2017   Renal cyst, left     Medications:  Medications Prior to Admission  Medication  Sig Dispense Refill Last Dose   albuterol (VENTOLIN HFA) 108 (90 Base) MCG/ACT inhaler INHALE 2 PUFFS INTO THE LUNGS EVERY 6 HOURS AS NEEDED FOR WHEEZING OR SHORTNESS OF BREATH (Patient taking differently: Inhale 2 puffs into the lungs every 6 (six) hours as needed for wheezing or shortness of breath.) 20.1 g 1 06/10/2021 at pm   BAYER LOW DOSE 81 MG EC tablet Take 81 mg by mouth daily. Swallow whole.   06/10/2021 at 0800   dapagliflozin propanediol (FARXIGA) 10 MG TABS tablet Take 1 tablet (10 mg total) by mouth daily. 30 tablet 2 06/10/2021 at am   ENTRESTO 24-26 MG TAKE 1 TABLET BY MOUTH TWICE DAILY (Patient taking differently: Take 0.5 tablets by mouth 2 (two) times daily.) 180 tablet 3 06/10/2021 at am   Evolocumab (REPATHA SURECLICK) 992 MG/ML SOAJ Inject 140 mg into the skin every 14 (fourteen) days. 2 mL 11 05/28/2021   fentaNYL (DURAGESIC) 12 MCG/HR Place 1 patch onto the skin every 3 (three) days. 10 patch 0 06/06/2021   furosemide (LASIX) 40 MG tablet Take 40 mg of Lasix if you gain weight more than 2 pounds per day or more than 5 pounds per week (Patient taking differently: Take 40 mg by mouth every Monday, Wednesday, and Friday.) 30 tablet 2 06/10/2021 at pm   Multiple Vitamin  (MULTIVITAMIN) tablet Take 1 tablet by mouth 3 (three) times a week.   Past Week   polyethylene glycol (MIRALAX / GLYCOLAX) 17 g packet Take 17 g by mouth daily as needed for mild constipation. (Patient taking differently: Take 17 g by mouth daily as needed for mild constipation (MIX AND DRINK AS DIRECTED).) 14 each 0 unk   pravastatin (PRAVACHOL) 80 MG tablet Take 1 tablet (80 mg total) by mouth every evening. 90 tablet 3 06/10/2021 at pm   metoprolol succinate (TOPROL-XL) 25 MG 24 hr tablet Take 0.5 tablets (12.5 mg total) by mouth daily. (Patient not taking: Reported on 06/11/2021) 30 tablet 2 Not Taking   Scheduled:   Chlorhexidine Gluconate Cloth  6 each Topical Daily   digoxin  0.125 mg Oral Daily   feeding supplement  237 mL Oral TID BM   guaiFENesin  600 mg Oral BID   hydrALAZINE  10 mg Oral Q8H   isosorbide mononitrate  30 mg Oral Daily   magic mouthwash w/lidocaine  5 mL Oral QID   multivitamin  15 mL Oral Daily   polyethylene glycol  17 g Oral Daily   potassium chloride  20 mEq Oral Once   potassium chloride  40 mEq Oral Once   thiamine  100 mg Oral Daily   Infusions:   ceFEPime (MAXIPIME) IV Stopped (06/16/21 1703)   heparin 1,100 Units/hr (06/17/21 1100)   milrinone 0.5 mcg/kg/min (06/17/21 1100)   norepinephrine (LEVOPHED) Adult infusion      Assessment: Patient is a 70 year old male who presented with dyspnea, found to have an acute pulmonary embolism. Patient was initially started on heparin at [facility] where he was then switched to apixaban. Last apixaban dose on 10/16. Shock liver may also be contributing as AST/ALT are significantly elevated.INR decreased from 10.3 on 10/16 to 3.9 on 10/18. Pharmacy consulted to dose heparin. Platelets declined gradually to 68k. Hemoglobin and hematocrit trending down to 11.2 and 34.5 respectively. Patient was previously therapeutic at [facility] on 1100 units/hour. Baseline aPTT was 32 on 10/17. Heparin increased overnight  to 1100 units/hour and aPTT is now therapeutic at 92 seconds.  Will continue current dose and follow up anticoagulation plans per CCM and heart failure.  Goal of Therapy:  Heparin level 0.3-0.7 units/ml Monitor platelets by anticoagulation protocol: Yes   Plan:  Continue heparin at 1100 units/hr Check 8 hour confirmatory aPTT Daily CBC, aPTT, and Heparin level  Thank you for allowing pharmacy to participate in this patient's care.  Reatha Harps, PharmD PGY1 Pharmacy Resident 06/17/2021 12:18 PM Check [email or web address] for unit specific pharmacy number

## 2021-06-17 NOTE — Progress Notes (Signed)
This chaplain joined PMT NP-Mary in the Pt. education and notarization of the Pt. Advance Directive:  HCPOA. The Pt. requested the chaplain phone the Pt. daughter-Tracy and confirm her permission to move forward with documenting HCPOA. [name] supports documenting HCPOA.  The chaplain joined the Pt., notary, and two witnesses for the notarizing of the Pt. AD:  HCPOA.  The Pt. is naming [name] as his healthcare agent.  The chaplain gave the Pt. the original AD and one copy.  The chaplain scanned the Pt. AD into his EMR.  The chaplain is available for F/U spiritual care as needed.  Chaplain Sallyanne Kuster [phone]

## 2021-06-17 NOTE — Progress Notes (Addendum)
Daily Rounding Note  06/17/2021, 12:49 PM  LOS: 6 days   SUBJECTIVE:   Chief complaint:   Elevated LFTs, shock liver  Overall feels ok.  Throat pain is better Pall care NP in room w pt.    OBJECTIVE:         Vital signs in last 24 hours:    Temp:  [97 F (36.1 C)-98.4 F (36.9 C)] 97.2 F (36.2 C) (10/18 1100) Pulse Rate:  [48-102] 97 (10/18 1100) Resp:  [0-27] 17 (10/18 1100) BP: (105-168)/(58-134) 128/90 (10/18 1100) SpO2:  [84 %-100 %] 90 % (10/18 1100) Weight:  [57.6 kg] 57.6 kg (10/18 0630) Last BM Date: 06/16/21 Filed Weights   06/11/21 0109 06/12/21 0439 06/17/21 0630  Weight: 62 kg 52.7 kg 57.6 kg   General: Looks tired and chronically ill.  Comfortable.  NAD.   Heart: RRR Chest: no cough or dyspnea.  Hoarse vocals (chronic, longstanding).   Abdomen: soft, NT.  Active BS  Extremities: no CCE Neuro/Psych:  alert.  Fully oriented.  Appropriate.  Fully interactive.    Intake/Output from previous day: 10/17 0701 - 10/18 0700 In: 854.7 [P.O.:480; I.V.:274.6; IV Piggyback:100.1] Out: 995 [Urine:995]  Intake/Output this shift: Total I/O In: 435.7 [P.O.:360; I.V.:75.7] Out: -   Lab Results: Recent Labs    06/15/21 0329 06/16/21 0406 06/16/21 1151 06/17/21 0446  WBC 11.9* 9.2  --  5.4  HGB 13.7 12.5* 13.3  13.3 11.2*  HCT 42.2 38.2* 39.0  39.0 34.5*  PLT 147* 83*  --  68*   BMET Recent Labs    06/15/21 0329 06/16/21 0406 06/16/21 1151 06/17/21 0443  NA 129* 131* 133*  135 133*  K 4.5 4.7 4.2  3.9 3.6  CL 90* 92*  --  93*  CO2 22 26  --  30  GLUCOSE 119* 102*  --  138*  BUN 69* 80*  --  69*  CREATININE 2.07* 1.98*  --  1.60*  CALCIUM 9.3 9.3  --  8.6*   LFT Recent Labs    06/15/21 0329 06/16/21 0406 06/17/21 0443  PROT 7.0 6.8 5.7*  ALBUMIN 3.8 3.8 2.9*  AST 3,936* 4,265* 2,280*  ALT 2,964* 3,809* 3,159*  ALKPHOS 55 54 48  BILITOT 0.8 1.5* 1.5*   PT/INR Recent Labs     06/16/21 1309 06/17/21 0446  LABPROT 51.9* 38.6*  INR 5.8* 3.9*   Hepatitis Panel Recent Labs    06/16/21 0406  HEPBSAG NON REACTIVE  HCVAB Reactive*  HEPAIGM NON REACTIVE  HEPBIGM NON REACTIVE    Studies/Results: CARDIAC CATHETERIZATION  Result Date: 06/16/2021 Findings: RA = 15 RV =  35/14 PA = 36/17 (25) PCW = 13 Fick cardiac output/index = 2.9/1.8 Thermo CO/CI = 3.1/1.9 PVR = 4.0 WU SVR = 2,166 Ao sat = 99% PA sat = 55%, 56% PaPI = 1.3 Assessment: Cardiogenic shock with R>L heart failure Plan/Discussion: Overall prognosis appears quite poor. Not candidate for advanced therapies. Will start inotropes and assess response. Glori Bickers, MD 1:21 PM   Scheduled Meds:  Chlorhexidine Gluconate Cloth  6 each Topical Daily   digoxin  0.125 mg Oral Daily   feeding supplement  237 mL Oral TID BM   guaiFENesin  600 mg Oral BID   hydrALAZINE  10 mg Oral Q8H   isosorbide mononitrate  30 mg Oral Daily   magic mouthwash w/lidocaine  5 mL Oral QID   multivitamin  15 mL Oral Daily  polyethylene glycol  17 g Oral Daily   potassium chloride  20 mEq Oral Once   potassium chloride  40 mEq Oral Once   thiamine  100 mg Oral Daily   Continuous Infusions:  ceFEPime (MAXIPIME) IV Stopped (06/16/21 1703)   heparin 1,100 Units/hr (06/17/21 1100)   milrinone 0.5 mcg/kg/min (06/17/21 1100)   norepinephrine (LEVOPHED) Adult infusion     PRN Meds:.acetaminophen (TYLENOL) oral liquid 160 mg/5 mL, albuterol, lidocaine, menthol-cetylpyridinium, nitroGLYCERIN, ondansetron (ZOFRAN) IV, phenol  ASSESMENT:   Elevated LFTs, elevated INR.  Presumed shock liver.  LFTs turned corner, on downward train as of this AM. INR 1.2 >> 10.3 >> 3.9 over last 6 days.  06/15/2021 ultrasound abdomen with Dopplers: Other than trace upper abdominal ascites, nonspecific GB wall thickening, this was normal study with 3.8 mm CBD, normal Dopplers.   Hep C hx, treated and cured.  Undetectable virus after 12 weeks Epclusa  completed in 12/2020 at [facility] NP.  Fibroscan 08/2020 w F0-F1 fibrosis.  LDH elevated.  IgG normal. HCV reactive.   Hep A IgM NR.   Hep B core IgM NR.  Smooth muscle Ab, ANA, pndg.  Not unusual for HCV to remain Positive for many years, despite SVR.      Acute pulmonary embolus, on heparin infusion then transitioned to Eliquis 10/13, now back on Heparin.  S/p R thoracentesis to address pleural effusion.    AcuteRight heart cath shows cardiogenic shock, R >> L heart failure, overall poor prognosis.  Dr. Haroldine Laws initiated milrinone.  Pt off nor epi.     Thrombocytopenia.  Down trending to 52 K today   AKI with baseline stage 3a CKD.  BUN/creat improved.      Hyponatremia.  Na 129 >> 133.     Squamous cell esophageal cancer 2010.  Treated with chemo/radiation.  Temporary G-tube.  Subsequently developed throat cancer also treated with chemoradiation.  No recurrence of either malignancy.     PLAN    Recheck LFTs, INR in AM, if still downtrending, no need to follow until normal.  Check HCV qual in AM.  Will follow peripherally.      Azucena Freed  06/17/2021, 12:49 PM Phone [phone]     Attending physician's note   I have taken an interval history, reviewed the chart and examined the patient. I agree with the Advanced Practitioner's note, impression and recommendations.   LFTs now trending down. INR 3.9 on heparin. No GI intervention planned  Continue supportive care Trend LFTs/INR.  Check HCV quant in AM Pl call if we could assist in any way. Will follow peripherally.   Carmell Austria, MD [facility] [phone]

## 2021-06-18 ENCOUNTER — Ambulatory Visit (HOSPITAL_COMMUNITY): Payer: Medicare Other

## 2021-06-18 DIAGNOSIS — R531 Weakness: Secondary | ICD-10-CM | POA: Diagnosis not present

## 2021-06-18 DIAGNOSIS — J9601 Acute respiratory failure with hypoxia: Secondary | ICD-10-CM | POA: Diagnosis not present

## 2021-06-18 DIAGNOSIS — R570 Cardiogenic shock: Secondary | ICD-10-CM | POA: Diagnosis not present

## 2021-06-18 DIAGNOSIS — N179 Acute kidney failure, unspecified: Secondary | ICD-10-CM | POA: Diagnosis not present

## 2021-06-18 DIAGNOSIS — I5023 Acute on chronic systolic (congestive) heart failure: Secondary | ICD-10-CM | POA: Diagnosis not present

## 2021-06-18 DIAGNOSIS — Z515 Encounter for palliative care: Secondary | ICD-10-CM | POA: Diagnosis not present

## 2021-06-18 DIAGNOSIS — I5021 Acute systolic (congestive) heart failure: Secondary | ICD-10-CM | POA: Diagnosis not present

## 2021-06-18 DIAGNOSIS — I2601 Septic pulmonary embolism with acute cor pulmonale: Secondary | ICD-10-CM | POA: Diagnosis not present

## 2021-06-18 DIAGNOSIS — I2609 Other pulmonary embolism with acute cor pulmonale: Secondary | ICD-10-CM | POA: Diagnosis not present

## 2021-06-18 LAB — APTT: aPTT: 95 s — ABNORMAL HIGH (ref 24–36)

## 2021-06-18 LAB — CBC
HCT: 33.9 % — ABNORMAL LOW (ref 39.0–52.0)
Hemoglobin: 10.6 g/dL — ABNORMAL LOW (ref 13.0–17.0)
MCH: 26.7 pg (ref 26.0–34.0)
MCHC: 31.3 g/dL (ref 30.0–36.0)
MCV: 85.4 fL (ref 80.0–100.0)
Platelets: 82 K/uL — ABNORMAL LOW (ref 150–400)
RBC: 3.97 MIL/uL — ABNORMAL LOW (ref 4.22–5.81)
RDW: 14.5 % (ref 11.5–15.5)
WBC: 6.4 K/uL (ref 4.0–10.5)
nRBC: 0.8 % — ABNORMAL HIGH (ref 0.0–0.2)

## 2021-06-18 LAB — COMPREHENSIVE METABOLIC PANEL WITH GFR
ALT: 2315 U/L — ABNORMAL HIGH (ref 0–44)
AST: 1340 U/L — ABNORMAL HIGH (ref 15–41)
Albumin: 2.8 g/dL — ABNORMAL LOW (ref 3.5–5.0)
Alkaline Phosphatase: 54 U/L (ref 38–126)
Anion gap: 8 (ref 5–15)
BUN: 48 mg/dL — ABNORMAL HIGH (ref 8–23)
CO2: 29 mmol/L (ref 22–32)
Calcium: 8.2 mg/dL — ABNORMAL LOW (ref 8.9–10.3)
Chloride: 94 mmol/L — ABNORMAL LOW (ref 98–111)
Creatinine, Ser: 1.17 mg/dL (ref 0.61–1.24)
GFR, Estimated: >60 mL/min
Glucose, Bld: 94 mg/dL (ref 70–99)
Potassium: 3.6 mmol/L (ref 3.5–5.1)
Sodium: 131 mmol/L — ABNORMAL LOW (ref 135–145)
Total Bilirubin: 1.7 mg/dL — ABNORMAL HIGH (ref 0.3–1.2)
Total Protein: 5.7 g/dL — ABNORMAL LOW (ref 6.5–8.1)

## 2021-06-18 LAB — CULTURE, GROUP A STREP (THRC)

## 2021-06-18 LAB — HEPARIN LEVEL (UNFRACTIONATED): Heparin Unfractionated: >1.1 IU/mL — ABNORMAL HIGH (ref 0.30–0.70)

## 2021-06-18 LAB — COOXEMETRY PANEL
Carboxyhemoglobin: 1.6 % — ABNORMAL HIGH (ref 0.5–1.5)
Methemoglobin: 0.8 % (ref 0.0–1.5)
O2 Saturation: 59.1 %
Total hemoglobin: 11.7 g/dL — ABNORMAL LOW (ref 12.0–16.0)

## 2021-06-18 MED ORDER — FUROSEMIDE 10 MG/ML IJ SOLN
80.0000 mg | Freq: Once | INTRAMUSCULAR | Status: AC
  Administered 2021-06-18: 80 mg via INTRAVENOUS
  Filled 2021-06-18: qty 8

## 2021-06-18 MED ORDER — SODIUM CHLORIDE 0.9 % IV SOLN
2.0000 g | Freq: Two times a day (BID) | INTRAVENOUS | Status: DC
  Administered 2021-06-18 – 2021-06-20 (×5): 2 g via INTRAVENOUS
  Filled 2021-06-18 (×6): qty 2

## 2021-06-18 MED ORDER — POTASSIUM CHLORIDE CRYS ER 20 MEQ PO TBCR
40.0000 meq | EXTENDED_RELEASE_TABLET | ORAL | Status: AC
Start: 2021-06-18 — End: 2021-06-18
  Administered 2021-06-18 (×3): 40 meq via ORAL
  Filled 2021-06-18 (×3): qty 2

## 2021-06-18 MED ORDER — POTASSIUM CHLORIDE CRYS ER 20 MEQ PO TBCR: 40.0000 meq | EXTENDED_RELEASE_TABLET | Freq: Once | ORAL | Status: DC

## 2021-06-18 MED ORDER — SPIRONOLACTONE 12.5 MG HALF TABLET
12.5000 mg | ORAL_TABLET | Freq: Every day | ORAL | Status: DC
  Administered 2021-06-18 – 2021-06-24 (×7): 12.5 mg via ORAL
  Filled 2021-06-18 (×7): qty 1

## 2021-06-18 MED ORDER — HYDRALAZINE HCL 25 MG PO TABS
25.0000 mg | ORAL_TABLET | Freq: Three times a day (TID) | ORAL | Status: DC
  Administered 2021-06-18 – 2021-06-19 (×2): 25 mg via ORAL
  Filled 2021-06-18 (×2): qty 1

## 2021-06-18 NOTE — Progress Notes (Signed)
NAME:  Collin Young, MRN:  [id number], DOB:  December 19, 1950, LOS: 7 ADMISSION DATE:  06/11/2021, CONSULTATION DATE:  06/12/21 REFERRING MD:  Dr Marylyn Ishihara, CHIEF COMPLAINT: Pleural effusion  History of Present Illness:  Collin Young is a 70 y.o. male with a PMH significant for systolic and diastolic CHF, COPD, prior smoker, CAD, esophageal and prostate cancer s/p chemo and radiation, GERD, prior ETOH abuse, seizures, HTN, and HLD who presented to the ED  for complaints of shortness of breath.    On ED admit patient was treated for acute COPD exacerbation with BDs and supplemental oxygen. CTA chest was obtained that revealed lobar/subsegmental PE's on right with large right and small left pleural effusions. Given PE and pleural effusion PCCM consult for pulmonary eval.    Of note patient was recently admitted at this facility 10/3 - 10/4 for similar presented and was treated for acute CHF exacerbation.  10/17 PCCM re-consulted   Pertinent  Medical History  Combined systolic and congestive heart failure  COPD CAD Esophageal cancer  GERD Remote ETOH and tobacco use  Hx of seizures  HLD HTN Prostate cancer  Significant Hospital Events:  10/12 Admitted with shortness of breath, underwent thoracentesis with 1.2L clear yellow fluid removed - trasnudative by lights   10/13 cardiology consulted with recommendation to increased diuresing, note LFTs normal    10/15 creatinine increased to 2.11 with decreased urine output, also developed chest pain with frequent PVC 10/16 severally elevated LFTs suggesting acute shock liver  10/16  cytology with reactive mesothelial cells  10/17 transferred to [facility] for heart cath  Cath with cardiogenic shock with R > L heart failure   Interim History / Subjective:  His throat is feeling better and he denies new complaints.  Objective   Blood pressure (!) 151/83, pulse 97, temperature 97.9 F (36.6 C), resp. rate 13, height 5\' 6"  (1.676 m), weight  56.3 kg, SpO2 98 %. PAP: (36-60)/(13-34) 48/21 CVP:  [6 mmHg-24 mmHg] 10 mmHg PCWP:  [15 mmHg-22 mmHg] 17 mmHg CO:  [3.6 L/min-6 L/min] 4.3 L/min CI:  [2.2 L/min/m2-3.6 L/min/m2] 2.6 L/min/m2      Intake/Output Summary (Last 24 hours) at 06/18/2021 0722 Last data filed at 06/18/2021 0500 Gross per 24 hour  Intake 2058.52 ml  Output 2750 ml  Net -691.48 ml    Filed Weights   06/12/21 0439 06/17/21 0630 06/18/21 0500  Weight: 52.7 kg 57.6 kg 56.3 kg    Examination: General: frail, chronically ill appearing man lying in bed in NAD HEENT: [place]/AT, eyes anicteric Neuro: awake and alert, sitting up in bed in NAD, moving extremities. CV: S1S2S3, +JVD PULM:  breathing comfortably on [place], CTAB, no accessory muscle GI: soft, NT, ND Extremities: no LE edema or clubbing, no cyanosis Skin: warm, dry, no rashes  Coox 59 Na+ 131 BUN 48 Cr 1.17 AST 1340 ALT 2315 T bili 1.7 H/H 10.6/33.9 Platelets 82 INR 3.9  CVP 13-14 PAP 48/20 (29) PCWP 19  CI 2.3 CO 3.8 PVR 211 SVR [facility] Problem list     Assessment & Plan:   Cardiogenic shock with acute biventricular HFrEF Acute PE likely precipitating acute RV failure ICM, CAD -con't heparin infusion; may need to check HIT and switch to bival or switch to DOAC once liver function recovers -con't milrinone 0.81mcg -daily coox -con't Imdur, hydralazine   -Swan for hemodynamics per Cardiology -Ongoing GOC discussions. I am concerned about his quality of life and ability to  recover to his previous baseline.   Acute liver injury due to congestive hepatopathy with ascites Coagulopathy due to liver injury -daily LFTs -con't milrinone and digoxin -recheck INR tomorrow -avoid hepatically metabolized medications  Thrombocytopenia, improving Acute anemia -con't daily CBC -transfuse for Hb<7 or hemodynamically significant bleeding  AKI, suspect pre-renal due to low flow state. Baseline CKDIIIa -con't milrinone to  optimize renal perfusion -lasix once daily -strict I/OS -renally dose meds, avoid nephrotoxic medications -monitor daily  Acute respiratory failure due to acute pulm edema and R pleural effusion Possible pneumonia -milrinone gtt, diuresis -wean O2 as able to maintain SpO2 >90% -con't broad antibiotics for 7 days  Hyperglycemia, improved -can stop accuchecks  Hyponatremia due to heart failure -con't to monitor  Severe protein energy malnutrition-- appetite improving -ensure TID, encourage PO intake -frequent snacks -MVI, thiamine  History of esophageal, piriform sinus throat, and prostate cancers -OP management  GoC -Daughter updated at bedside. She understands that he is very ill and may not be able to recover from this. We will continue to assess daily. -Palliative care consult-- meeting again this weekend.   Best Practice (right click and "Reselect all SmartList Selections" daily)   Diet/type: Regular consistency (see orders) DVT prophylaxis: systemic heparin GI prophylaxis: PPI Lines: Central line Foley:  Yes, and it is still needed Code Status:  full code Last date of multidisciplinary goals of care discussion: 10/18- daughter updated at bedside, full scope for now, but understanding that there are limitations and he wants to go back to his previous QOL  Critical care time:       This patient is critically ill with multiple organ system failure which requires frequent high complexity decision making, assessment, support, evaluation, and titration of therapies. This was completed through the application of advanced monitoring technologies and extensive interpretation of multiple databases. During this encounter critical care time was devoted to patient care services described in this note for 38 minutes.  Julian Hy, DO 06/18/21 2:08 PM [facility]

## 2021-06-18 NOTE — Progress Notes (Signed)
[facility] for heparin Indication: pulmonary embolus  No Known Allergies  Patient Measurements: Height: 5\' 6"  (167.6 cm) Weight: 57.6 kg (126 lb 15.8 oz) IBW/kg (Calculated) : 63.8 Heparin Dosing Weight: 52.7 kg  Vital Signs: Temp: 97.9 F (36.6 C) (10/19 0500) Temp Source: Core (10/19 0400) BP: 151/83 (10/19 0500) Pulse Rate: 97 (10/19 0500)  Labs: Recent Labs    06/15/21 0831 06/16/21 0406 06/16/21 0406 06/16/21 1151 06/16/21 1309 06/16/21 2314 06/17/21 0443 06/17/21 0446 06/17/21 1000 06/17/21 1039 06/17/21 1800 06/18/21 0352  HGB  --  12.5*   < > 13.3  13.3  --   --   --  11.2*  --   --   --  10.6*  HCT  --  38.2*   < > 39.0  39.0  --   --   --  34.5*  --   --   --  33.9*  PLT  --  83*  --   --   --   --   --  68*  --   --   --  82*  APTT  --   --   --   --  32   < >  --   --   --  92* 119* 95*  LABPROT 81.4*  --   --   --  51.9*  --   --  38.6*  --   --   --   --   INR 10.3*  --   --   --  5.8*  --   --  3.9*  --   --   --   --   HEPARINUNFRC  --   --   --   --   --   --   --   --  >1.10*  --   --   --   CREATININE  --  1.98*  --   --   --   --  1.60*  --   --   --   --   --    < > = values in this interval not displayed.     Estimated Creatinine Clearance: 35 mL/min (A) (by C-G formula based on SCr of 1.6 mg/dL (H)).   Assessment: Patient is a 70 year old male who presented with dyspnea, found to have an acute pulmonary embolism. Patient was initially started on heparin at [facility] where he was then switched to apixaban. Last apixaban dose on 10/16. Shock liver may also be contributing as AST/ALT are significantly elevated.INR decreased from 10.3 on 10/16 to 3.9 on 10/18.  aPTT therapeutic (95 sec) on gtt at 1000 units/hr. No heparin level done this morning but likely still affected by apixaban. No bleeding noted.  Goal of Therapy:  Heparin level 0.3-0.7 units/ml Monitor platelets by anticoagulation  protocol: Yes   Plan:  Continue  IV heparin at 1000 units/hr Daily CBC, aPTT, and Heparin level  Sherlon Handing, PharmD, BCPS Please see amion for complete clinical pharmacist phone list  06/18/2021 5:32 AM

## 2021-06-18 NOTE — Progress Notes (Addendum)
Advanced Heart Failure Rounding Note   Subjective:    On milrinone 0.5. Co-ox 59%. CI 2.3     LFTs down-trending w/ support. AKI resolved.   Plts trending back up 83>>68>>82K Hgb stable 11.2>>10.6  2.8L in UOP yesterday w/ IV Lasix. Wt down 2 lb. CVP 13-14   Swan #s  CVP 13-14 PAP 48/20 (29) PCWP 19  CI 2.3 CO 3.8 PVR 211 SVR 1962  Co-ox 59%   OOB, sitting up in chair. Feeling better. O2 sats 95% on RA.    Objective:   Weight Range:  Vital Signs:   Temp:  [97.2 F (36.2 C)-98.4 F (36.9 C)] 97.7 F (36.5 C) (10/19 0726) Pulse Rate:  [94-107] 107 (10/19 0726) Resp:  [12-23] 19 (10/19 0726) BP: (115-165)/(55-97) 142/83 (10/19 0700) SpO2:  [90 %-100 %] 96 % (10/19 0726) Weight:  [56.3 kg] 56.3 kg (10/19 0500) Last BM Date: 06/16/21  Weight change: Filed Weights   06/12/21 0439 06/17/21 0630 06/18/21 0500  Weight: 52.7 kg 57.6 kg 56.3 kg    Intake/Output:   Intake/Output Summary (Last 24 hours) at 06/18/2021 0803 Last data filed at 06/18/2021 0700 Gross per 24 hour  Intake 2075.38 ml  Output 2750 ml  Net -674.62 ml     PHYSICAL EXAM: General:  thin/ chronically appearing male. No respiratory difficulty HEENT: normal Neck: supple. JVD 14 cm. + Lt IJ Swan. Carotids 2+ bilat; no bruits. No lymphadenopathy or thyromegaly appreciated. Cor: PMI nondisplaced. Regular rate & rhythm. 2/6 TR murmur  Lungs: course bilaterally. No wheezing  Abdomen: soft, nontender, nondistended. No hepatosplenomegaly. No bruits or masses. Good bowel sounds. Extremities: no cyanosis, clubbing, rash, edema Neuro: alert & oriented x 3, cranial nerves grossly intact. moves all 4 extremities w/o difficulty. Affect pleasant.   Telemetry: NSR 90s, occasional PVCs Personally reviewed   Labs: Basic Metabolic Panel: Recent Labs  Lab 06/14/21 0402 06/15/21 0329 06/16/21 0406 06/16/21 1151 06/17/21 0443 06/18/21 0352  NA 133* 129* 131* 133*  135 133* 131*  K 5.2* 4.5 4.7  4.2  3.9 3.6 3.6  CL 95* 90* 92*  --  93* 94*  CO2 12* 22 26  --  30 29  GLUCOSE 90 119* 102*  --  138* 94  BUN 53* 69* 80*  --  69* 48*  CREATININE 2.11* 2.07* 1.98*  --  1.60* 1.17  CALCIUM 8.9 9.3 9.3  --  8.6* 8.2*  MG  --  3.0* 3.0*  --  2.8*  --     Liver Function Tests: Recent Labs  Lab 06/12/21 0327 06/15/21 0329 06/16/21 0406 06/17/21 0443 06/18/21 0352  AST 22 3,936* 4,265* 2,280* 1,340*  ALT 15 2,964* 3,809* 3,159* 2,315*  ALKPHOS 34* 55 54 48 54  BILITOT 0.6 0.8 1.5* 1.5* 1.7*  PROT 6.7 7.0 6.8 5.7* 5.7*  ALBUMIN 3.5 3.8 3.8 2.9* 2.8*   No results for input(s): LIPASE, AMYLASE in the last 168 hours. No results for input(s): AMMONIA in the last 168 hours.  CBC: Recent Labs  Lab 06/14/21 0402 06/15/21 0329 06/16/21 0406 06/16/21 1151 06/17/21 0446 06/18/21 0352  WBC 11.8* 11.9* 9.2  --  5.4 6.4  HGB 15.7 13.7 12.5* 13.3  13.3 11.2* 10.6*  HCT 50.5 42.2 38.2* 39.0  39.0 34.5* 33.9*  MCV 87.8 84.4 83.6  --  84.8 85.4  PLT 203 147* 83*  --  68* 82*    Cardiac Enzymes: No results for input(s): CKTOTAL, CKMB, CKMBINDEX, TROPONINI in the  last 168 hours.  BNP: BNP (last 3 results) Recent Labs    05/11/21 0523 06/02/21 0311 06/11/21 0225  BNP 1,797.3* 2,342.9* >4,500.0*    ProBNP (last 3 results) Recent Labs    05/14/21 0935  PROBNP 4,378.0*      Other results:  Imaging: CARDIAC CATHETERIZATION  Result Date: 06/16/2021 Findings: RA = 15 RV =  35/14 PA = 36/17 (25) PCW = 13 Fick cardiac output/index = 2.9/1.8 Thermo CO/CI = 3.1/1.9 PVR = 4.0 WU SVR = 2,166 Ao sat = 99% PA sat = 55%, 56% PaPI = 1.3 Assessment: Cardiogenic shock with R>L heart failure Plan/Discussion: Overall prognosis appears quite poor. Not candidate for advanced therapies. Will start inotropes and assess response. Glori Bickers, MD 1:21 PM    Medications:     Scheduled Medications:  Chlorhexidine Gluconate Cloth  6 each Topical Daily   digoxin  0.125 mg Oral  Daily   feeding supplement  237 mL Oral TID BM   guaiFENesin  600 mg Oral BID   hydrALAZINE  10 mg Oral Q8H   isosorbide mononitrate  30 mg Oral Daily   magic mouthwash w/lidocaine  5 mL Oral QID   multivitamin  15 mL Oral Daily   polyethylene glycol  17 g Oral Daily   potassium chloride  40 mEq Oral Once   thiamine  100 mg Oral Daily    Infusions:  ceFEPime (MAXIPIME) IV     heparin 1,000 Units/hr (06/18/21 0700)   milrinone 0.5 mcg/kg/min (06/18/21 0700)   norepinephrine (LEVOPHED) Adult infusion      PRN Medications: acetaminophen (TYLENOL) oral liquid 160 mg/5 mL, albuterol, lidocaine, menthol-cetylpyridinium, nitroGLYCERIN, ondansetron (ZOFRAN) IV, phenol   Assessment/Plan:   1. Acute on Chronic Systolic Heart Failure -> Cardiogenic Shock  - Echo 2018 EF 10-15%, RV moderately reduced. LHC w/ MVCAD management medically - Echo 05/2017, EF improved up to 45-50%, Mod AI. RV normal.   - Echo 05/2020 EF down again, 35-40%. RV normal  - Echo 05/2021 EF down 25-30%, RV not well visualized. Mod MR and Mod AI. - Limited Echo 10/22: EF 20%, RV normal  - Admitted w/ a/c CHF w/ cardiogenic shock w/ AKI + Shock liver - RHC 06/16/21 w/ R>L HF and low output, CI 1.8. PAPi 1.3  - Now on milrinone 0.5. CI 2.3 today. LFTs improving, AKI resolved   - CVP 14 today, 80 IV Lasix x 1 w/ K Supp  - Overall prognosis appears quite poor. Not candidate for advanced therapies - Need to discuss GOC. PCM following. Family meeting planned later this week   2. Acute Pulmonary Embolism  - CT w/ rt lower lobe PE  - Limited echo w/ no RV strain  - Suspect incidental due HF - continue heparin   3. Shock Liver - due to shock. Improving with inotropes   4. AKI on Stage IIIa - Cardiorenal  - Baseline SCr 1.3 - Bumped to 2.11-> resolved with support, SCr 1.17 today  - continue milrinone   5. Large Rt Pleural Effusion  - s/p thoracentesis by CCM 10/12 - 1200 cc removed, transudative    6. H/o  esophageal CA and throat CA - s/p chemo and XRT  7. Thrombocytopenia - PLTs trending up, 83>>68>>82K - no bleeding  8. Possible HAP - cefepime per PCCM    Length of Stay: Joshua PA-C  06/18/2021, 8:03 AM  Advanced Heart Failure Team Pager [phone] (M-F; 7a - 4p)  Please contact [facility]  for night-coverage after hours (4p -7a ) and weekends on [email or web address]   Agree with above.  He remains on high-dose milrinone. Hemodynamics improving. Feeling a bit better. Less fatigued. Liver function improving. PLTs improving. Remains on heparin. No bleeding  General:  Frail elderly male. Lying inbed  No resp difficulty HEENT: normal Neck: supple. LIJ swan Carotids 2+ bilat; no bruits. No lymphadenopathy or thryomegaly appreciated. Cor: PMI laterally displaced. Regular rate & rhythm. +s3 Lungs: clear Abdomen: soft, nontender, nondistended. No hepatosplenomegaly. No bruits or masses. Good bowel sounds. Extremities: no cyanosis, clubbing, rash, edema Neuro: alert & orientedx3, cranial nerves grossly intact. moves all 4 extremities w/o difficulty. Affect pleasant  He remains very tenuous with severe biventricular HF but somewhat improved. Will drop milrinone to 0.375 and titrate hydralazine. Continue digoxin. Add spiro.   He is not candidate for advanced therapies at this point. The question remains whether we will be able to wean him off of inotropes and back onto orals. If not, likely will be palliative situation.   Need to ambulate him.   CRITICAL CARE Performed by: Glori Bickers  Total critical care time: 40 minutes  Critical care time was exclusive of separately billable procedures and treating other patients.  Critical care was necessary to treat or prevent imminent or life-threatening deterioration.  Critical care was time spent personally by me (independent of midlevel providers or residents) on the following activities: development of treatment plan with  patient and/or surrogate as well as nursing, discussions with consultants, evaluation of patient's response to treatment, examination of patient, obtaining history from patient or surrogate, ordering and performing treatments and interventions, ordering and review of laboratory studies, ordering and review of radiographic studies, pulse oximetry and re-evaluation of patient's condition.  Glori Bickers, MD  10:58 PM

## 2021-06-18 NOTE — Progress Notes (Signed)
Patient ID: DORA CLAUSS, male   DOB: 1950/09/24, 70 y.o.   MRN: [id number]    Progress Note from the Palliative Medicine Team at [facility]   Patient Name: MCGWIRE DASARO        Date: 06/18/2021 DOB: 1950/09/04  Age: 70 y.o. MRN#: [id number] Attending Physician: Julian Hy, DO Primary Care Physician: Darreld Mclean, MD Admit Date: 06/11/2021   Medical records reviewed  Per cardiology note  Acute on Chronic Systolic Heart Failure -> Cardiogenic Shock  - Echo 2018 EF 10-15%, RV moderately reduced. LHC w/ MVCAD management medically - Echo 05/2017, EF improved up to 45-50%, Mod AI. RV normal.   - Echo 05/2020 EF down again, 35-40%. RV normal  - Echo 05/2021 EF down 25-30%, RV not well visualized. Mod MR and Mod AI. - Limited Echo 10/22: EF 20%, RV normal  - Admitted w/ a/c CHF w/ cardiogenic shock w/ AKI + Shock liver - RHC 06/16/21 w/ R>L HF and low output, CI 1.8. PAPi 1.3  - Now on milrinone 0.5. CI 2.3 today. LFTs improving, AKI resolved   - CVP 14 today, 80 IV Lasix x 1 w/ K Supp  - Overall prognosis appears quite poor. Not candidate for advanced therapies  This NP visited patient at the bedside as a follow up to  yesterday's [place].  Mr. Tritschler is awake and alert and pleasantly engages in conversation.  He offers no complaints and tells me he feels comfortable today  Education offered to  patient the importance of continued conversation with his family and his physicians regarding his medical situation, treatment option decisions and advance care planning decisions.  Plan is to meet on Sunday at 2:00 with his daughter/[name] at the bedside.  This nurse practitioner informed  the patient/family that I will be out of the hospital until Sunday morning.    Call palliative medicine team phone # [phone] with questions or concerns.   Total time spent on the unit was 15 minutes  Greater than 50% of the time was spent in counseling and coordination of  care  Wadie Lessen NP  Palliative Medicine Team Team Phone # [phone] Pager [phone]

## 2021-06-18 NOTE — Progress Notes (Signed)
Overall, Collin Young is making slow but steady improvement.  His liver function studies are improving.  He is on milrinone.  Hopefully this is helping his right-sided heart failure.  He is on heparin infusion for his pulmonary emboli.  I do not see a INR back yet today to see how his coagulopathy is doing from the liver failure.  Has been no obvious bleeding.  He might be swallowing a little bit better.  His ALT is 2315.  The AST is 1340.  The bilirubin is 1.7.  His BUN is 48 creatinine 1.17.  The white cell count is 6.4.  Hemoglobin 10.6.  Platelet count 82,000.  The platelet count is improving secondary to his LFTs and liver function improving.  Clearly, his prognosis is based upon the right-sided heart failure.  Again I do not think that malignancy is going to be a problem for him.  Hopefully, he will be able to be converted over to an oral agent.  I would think that this would happen once his INR improves.  Pharmacy is totally on top of this.  Lattie Haw, MD  Oswaldo Milian 35:4

## 2021-06-19 ENCOUNTER — Other Ambulatory Visit (HOSPITAL_COMMUNITY): Payer: Medicare Other

## 2021-06-19 ENCOUNTER — Other Ambulatory Visit (HOSPITAL_COMMUNITY): Payer: Self-pay

## 2021-06-19 ENCOUNTER — Inpatient Hospital Stay: Payer: Self-pay

## 2021-06-19 ENCOUNTER — Ambulatory Visit (HOSPITAL_COMMUNITY)
Admission: RE | Admit: 2021-06-19 | Discharge: 2021-06-19 | Disposition: A | Discharge status: Home / Self Care | Payer: Medicare Other | Source: Ambulatory Visit | Attending: Cardiovascular Disease | Admitting: Cardiovascular Disease

## 2021-06-19 DIAGNOSIS — N179 Acute kidney failure, unspecified: Secondary | ICD-10-CM | POA: Diagnosis not present

## 2021-06-19 DIAGNOSIS — R570 Cardiogenic shock: Secondary | ICD-10-CM | POA: Diagnosis not present

## 2021-06-19 DIAGNOSIS — I5023 Acute on chronic systolic (congestive) heart failure: Secondary | ICD-10-CM | POA: Diagnosis not present

## 2021-06-19 DIAGNOSIS — I50811 Acute right heart failure: Secondary | ICD-10-CM | POA: Diagnosis not present

## 2021-06-19 DIAGNOSIS — R7989 Other specified abnormal findings of blood chemistry: Secondary | ICD-10-CM | POA: Diagnosis not present

## 2021-06-19 LAB — PROTIME-INR
INR: 1.5 — ABNORMAL HIGH (ref 0.8–1.2)
Prothrombin Time: 18.2 seconds — ABNORMAL HIGH (ref 11.4–15.2)

## 2021-06-19 LAB — COOXEMETRY PANEL
Carboxyhemoglobin: 2.1 % — ABNORMAL HIGH (ref 0.5–1.5)
Carboxyhemoglobin: 2.1 % — ABNORMAL HIGH (ref 0.5–1.5)
Methemoglobin: 0.5 % (ref 0.0–1.5)
Methemoglobin: 0.4 % (ref 0.0–1.5)
O2 Saturation: 65.8 %
O2 Saturation: 79.7 %
Total hemoglobin: 10.5 g/dL — ABNORMAL LOW (ref 12.0–16.0)
Total hemoglobin: 11.9 g/dL — ABNORMAL LOW (ref 12.0–16.0)

## 2021-06-19 LAB — COMPREHENSIVE METABOLIC PANEL
ALT: 1812 U/L — ABNORMAL HIGH (ref 0–44)
AST: 790 U/L — ABNORMAL HIGH (ref 15–41)
Albumin: 2.7 g/dL — ABNORMAL LOW (ref 3.5–5.0)
Alkaline Phosphatase: 55 U/L (ref 38–126)
Anion gap: 7 (ref 5–15)
BUN: 29 mg/dL — ABNORMAL HIGH (ref 8–23)
CO2: 29 mmol/L (ref 22–32)
Calcium: 8.1 mg/dL — ABNORMAL LOW (ref 8.9–10.3)
Chloride: 95 mmol/L — ABNORMAL LOW (ref 98–111)
Creatinine, Ser: 0.96 mg/dL (ref 0.61–1.24)
GFR, Estimated: >60 mL/min (ref >60)
Glucose, Bld: 85 mg/dL (ref 70–99)
Potassium: 4.3 mmol/L (ref 3.5–5.1)
Sodium: 131 mmol/L — ABNORMAL LOW (ref 135–145)
Total Bilirubin: 1.4 mg/dL — ABNORMAL HIGH (ref 0.3–1.2)
Total Protein: 5.4 g/dL — ABNORMAL LOW (ref 6.5–8.1)

## 2021-06-19 LAB — CBC
HCT: 34.5 % — ABNORMAL LOW (ref 39.0–52.0)
Hemoglobin: 10.8 g/dL — ABNORMAL LOW (ref 13.0–17.0)
MCH: 26.9 pg (ref 26.0–34.0)
MCHC: 31.3 g/dL (ref 30.0–36.0)
MCV: 85.8 fL (ref 80.0–100.0)
Platelets: 90 10*3/uL — ABNORMAL LOW (ref 150–400)
RBC: 4.02 MIL/uL — ABNORMAL LOW (ref 4.22–5.81)
RDW: 14.6 % (ref 11.5–15.5)
WBC: 7.5 10*3/uL (ref 4.0–10.5)
nRBC: 0 % (ref 0.0–0.2)

## 2021-06-19 LAB — MAGNESIUM: Magnesium: 2.1 mg/dL (ref 1.7–2.4)

## 2021-06-19 LAB — HCV RNA QUANT: HCV Quantitative: NOT DETECTED IU/mL (ref >50)

## 2021-06-19 LAB — HEPARIN LEVEL (UNFRACTIONATED)
Heparin Unfractionated: >1.1 IU/mL — ABNORMAL HIGH (ref 0.30–0.70)
Heparin Unfractionated: >1.1 IU/mL — ABNORMAL HIGH (ref 0.30–0.70)

## 2021-06-19 LAB — APTT: aPTT: 89 seconds — ABNORMAL HIGH (ref 24–36)

## 2021-06-19 MED ORDER — POTASSIUM CHLORIDE CRYS ER 20 MEQ PO TBCR: 20.0000 meq | EXTENDED_RELEASE_TABLET | Freq: Once | ORAL | Status: DC

## 2021-06-19 MED ORDER — SENNOSIDES-DOCUSATE SODIUM 8.6-50 MG PO TABS
2.0000 | ORAL_TABLET | Freq: Two times a day (BID) | ORAL | Status: DC
  Administered 2021-06-19 – 2021-06-24 (×7): 2 via ORAL
  Filled 2021-06-19 (×10): qty 2

## 2021-06-19 MED ORDER — SODIUM CHLORIDE 0.9% FLUSH: 10.0000 mL | INTRAVENOUS | Status: DC | PRN

## 2021-06-19 MED ORDER — SODIUM CHLORIDE 0.9% FLUSH
10.0000 mL | Freq: Two times a day (BID) | INTRAVENOUS | Status: DC
Start: 2021-06-19 — End: 2021-06-24
  Administered 2021-06-19: 20 mL
  Administered 2021-06-19 – 2021-06-24 (×8): 10 mL

## 2021-06-19 MED ORDER — POLYETHYLENE GLYCOL 3350 17 G PO PACK
17.0000 g | PACK | Freq: Two times a day (BID) | ORAL | Status: DC
  Administered 2021-06-19 – 2021-06-22 (×5): 17 g via ORAL
  Filled 2021-06-19 (×9): qty 1

## 2021-06-19 MED ORDER — FUROSEMIDE 10 MG/ML IJ SOLN
80.0000 mg | Freq: Once | INTRAMUSCULAR | Status: AC
  Administered 2021-06-19: 80 mg via INTRAVENOUS
  Filled 2021-06-19: qty 8

## 2021-06-19 MED ORDER — HYDRALAZINE HCL 25 MG PO TABS
37.5000 mg | ORAL_TABLET | Freq: Three times a day (TID) | ORAL | Status: DC
  Administered 2021-06-19 – 2021-06-24 (×16): 37.5 mg via ORAL
  Filled 2021-06-19 (×16): qty 2

## 2021-06-19 MED ORDER — POTASSIUM CHLORIDE CRYS ER 20 MEQ PO TBCR
40.0000 meq | EXTENDED_RELEASE_TABLET | Freq: Once | ORAL | Status: AC
  Administered 2021-06-19: 40 meq via ORAL
  Filled 2021-06-19: qty 2

## 2021-06-19 MED ORDER — SODIUM CHLORIDE 0.9 % IV SOLN: INTRAVENOUS | Status: DC | PRN

## 2021-06-19 NOTE — Progress Notes (Addendum)
Collin Young for heparin Indication: pulmonary embolus  No Known Allergies  Patient Measurements: Height: 5\' 6"  (167.6 cm) Weight: 58.2 kg (128 lb 4.9 oz) IBW/kg (Calculated) : 63.8 Heparin Dosing Weight: 52.7 kg  Vital Signs: Temp: 97.9 F (36.6 C) (10/20 0715) Temp Source: Core (10/20 0400) BP: 142/76 (10/20 0700) Pulse Rate: 111 (10/20 0715)  Labs: Recent Labs    06/16/21 1309 06/16/21 2314 06/17/21 0443 06/17/21 0446 06/17/21 1000 06/17/21 1039 06/17/21 1800 06/18/21 0352 06/18/21 0752 06/19/21 0331  HGB  --   --   --  11.2*  --   --   --  10.6*  --  10.8*  HCT  --   --   --  34.5*  --   --   --  33.9*  --  34.5*  PLT  --   --   --  68*  --   --   --  82*  --  90*  APTT 32   < >  --   --   --    < > 119* 95*  --  89*  LABPROT 51.9*  --   --  38.6*  --   --   --   --   --  18.2*  INR 5.8*  --   --  3.9*  --   --   --   --   --  1.5*  HEPARINUNFRC  --   --   --   --  >1.10*  --   --   --  >1.10* >1.10*  CREATININE  --   --  1.60*  --   --   --   --  1.17  --  0.96   < > = values in this interval not displayed.     Estimated Creatinine Clearance: 58.9 mL/min (by C-G formula based on SCr of 0.96 mg/dL).    Medications:   Scheduled:   Infusions:   ceFEPime (MAXIPIME) IV Stopped (06/18/21 2256)   heparin 1,000 Units/hr (06/19/21 0700)   milrinone 0.375 mcg/kg/min (06/19/21 0748)   norepinephrine (LEVOPHED) Adult infusion      Assessment: Patient is a 70 year old male who presented with dyspnea, found to have an acute pulmonary embolism. Patient was initially started on heparin at [facility] where he was then switched to apixaban. Last apixaban dose on 10/16. Shock liver may also be contributing as AST/ALT are significantly elevated.INR decreased from 10.3 on 10/16 to 3.9 on 10/18. Pharmacy consulted to dose heparin. Platelets declined gradually to 68k. Hemoglobin and hematocrit trending down to 11.2 and 34.5  respectively.  APTT therapeutic at 89 sec this morning. Heparin level not drawn. No overt bleeding or complications noted.  Goal of Therapy:  Heparin level 0.3-0.7 units/ml Monitor platelets by anticoagulation protocol: Yes   Plan:  Continue IV heparin at 1000 units/hr. Check daily aPTT Daily CBC, aPTT, and Heparin level  Thank you for allowing pharmacy to participate in this patient's care.  Reatha Harps, PharmD PGY1 Pharmacy Resident 06/19/2021 8:58 AM Check [email or web address] for unit specific pharmacy number

## 2021-06-19 NOTE — Progress Notes (Addendum)
Advanced Heart Failure Rounding Note   Subjective:    Now on milrinone 0.375. Co-ox stable w/ wean at 66% today. CI 2.9   LFTs continue to drift down w/ support. AKI resolved.   Plts trending back up 83>>68>>82>>90K Hgb stable 10.8   Good diuresis yesterday 3.9L in UOP. CVP 12. PCPW 22   OOB sitting up in chair. Feels better. No resting dyspnea. Complains of constipation. + flatus.      Objective:   Weight Range:  Vital Signs:   Temp:  [97.2 F (36.2 C)-99.5 F (37.5 C)] 97.9 F (36.6 C) (10/20 0715) Pulse Rate:  [98-125] 111 (10/20 0715) Resp:  [12-23] 18 (10/20 0715) BP: (118-162)/(57-85) 142/76 (10/20 0700) SpO2:  [92 %-99 %] 98 % (10/20 0715) Weight:  [58.2 kg] 58.2 kg (10/20 0500) Last BM Date: 06/17/21  Weight change: Filed Weights   06/17/21 0630 06/18/21 0500 06/19/21 0500  Weight: 57.6 kg 56.3 kg 58.2 kg    Intake/Output:   Intake/Output Summary (Last 24 hours) at 06/19/2021 0845 Last data filed at 06/19/2021 0700 Gross per 24 hour  Intake 1659.24 ml  Output 3900 ml  Net -2240.76 ml     PHYSICAL EXAM: CVP 12  General:  Well appearing male. No respiratory difficulty HEENT: normal Neck: supple. JVD 12 cm. + lt IJ swan. Carotids 2+ bilat; no bruits. No lymphadenopathy or thyromegaly appreciated. Cor: PMI nondisplaced. Regular rate & rhythm. No rubs, gallops or murmurs. Lungs: clear Abdomen: soft, nontender, nondistended. No hepatosplenomegaly. No bruits or masses. Good bowel sounds. Extremities: no cyanosis, clubbing, rash, edema Neuro: alert & oriented x 3, cranial nerves grossly intact. moves all 4 extremities w/o difficulty. Affect pleasant.  Telemetry: Sinus tach 102 bpm, occasional PVCs Personally reviewed   Labs: Basic Metabolic Panel: Recent Labs  Lab 06/15/21 0329 06/16/21 0406 06/16/21 1151 06/17/21 0443 06/18/21 0352 06/19/21 0331  NA 129* 131* 133*  135 133* 131* 131*  K 4.5 4.7 4.2  3.9 3.6 3.6 4.3  CL 90* 92*  --   93* 94* 95*  CO2 22 26  --  30 29 29   GLUCOSE 119* 102*  --  138* 94 85  BUN 69* 80*  --  69* 48* 29*  CREATININE 2.07* 1.98*  --  1.60* 1.17 0.96  CALCIUM 9.3 9.3  --  8.6* 8.2* 8.1*  MG 3.0* 3.0*  --  2.8*  --  2.1    Liver Function Tests: Recent Labs  Lab 06/15/21 0329 06/16/21 0406 06/17/21 0443 06/18/21 0352 06/19/21 0331  AST 3,936* 4,265* 2,280* 1,340* 790*  ALT 2,964* 3,809* 3,159* 2,315* 1,812*  ALKPHOS 55 54 48 54 55  BILITOT 0.8 1.5* 1.5* 1.7* 1.4*  PROT 7.0 6.8 5.7* 5.7* 5.4*  ALBUMIN 3.8 3.8 2.9* 2.8* 2.7*   No results for input(s): LIPASE, AMYLASE in the last 168 hours. No results for input(s): AMMONIA in the last 168 hours.  CBC: Recent Labs  Lab 06/15/21 0329 06/16/21 0406 06/16/21 1151 06/17/21 0446 06/18/21 0352 06/19/21 0331  WBC 11.9* 9.2  --  5.4 6.4 7.5  HGB 13.7 12.5* 13.3  13.3 11.2* 10.6* 10.8*  HCT 42.2 38.2* 39.0  39.0 34.5* 33.9* 34.5*  MCV 84.4 83.6  --  84.8 85.4 85.8  PLT 147* 83*  --  68* 82* 90*    Cardiac Enzymes: No results for input(s): CKTOTAL, CKMB, CKMBINDEX, TROPONINI in the last 168 hours.  BNP: BNP (last 3 results) Recent Labs    05/11/21 0523  06/02/21 0311 06/11/21 0225  BNP 1,797.3* 2,342.9* >4,500.0*    ProBNP (last 3 results) Recent Labs    05/14/21 0935  PROBNP 4,378.0*      Other results:  Imaging: No results found.   Medications:     Scheduled Medications:  Chlorhexidine Gluconate Cloth  6 each Topical Daily   digoxin  0.125 mg Oral Daily   feeding supplement  237 mL Oral TID BM   guaiFENesin  600 mg Oral BID   hydrALAZINE  25 mg Oral Q8H   isosorbide mononitrate  30 mg Oral Daily   magic mouthwash w/lidocaine  5 mL Oral QID   multivitamin  15 mL Oral Daily   polyethylene glycol  17 g Oral Daily   spironolactone  12.5 mg Oral Daily   thiamine  100 mg Oral Daily    Infusions:  ceFEPime (MAXIPIME) IV Stopped (06/18/21 2256)   heparin 1,000 Units/hr (06/19/21 0700)   milrinone  0.375 mcg/kg/min (06/19/21 0748)   norepinephrine (LEVOPHED) Adult infusion      PRN Medications: acetaminophen (TYLENOL) oral liquid 160 mg/5 mL, albuterol, lidocaine, menthol-cetylpyridinium, nitroGLYCERIN, ondansetron (ZOFRAN) IV, phenol   Assessment/Plan:   1. Acute on Chronic Systolic Heart Failure -> Cardiogenic Shock  - Echo 2018 EF 10-15%, RV moderately reduced. LHC w/ MVCAD management medically - Echo 05/2017, EF improved up to 45-50%, Mod AI. RV normal.   - Echo 05/2020 EF down again, 35-40%. RV normal  - Echo 05/2021 EF down 25-30%, RV not well visualized. Mod MR and Mod AI. - Limited Echo 10/22: EF 20%, RV normal  - Admitted w/ a/c CHF w/ cardiogenic shock w/ AKI + Shock liver - RHC 06/16/21 w/ R>L HF and low output, CI 1.8. PAPi 1.3  - Now on milrinone 0.375. CI 2.9 today. Co-ox 66%.  LFTs improving, AKI resolved   - Reduced milrinone to 0.25 and advance GDMT  - Increase hydralazine to 37.5 mg tid  - Continue Imdur 30  - Continue Spiro 12.5 daily  - Possible ARB/ARNi addition tomorrow  - CVP 12 today, 80 IV Lasix x 1 w/ K Supp  - Initial prognosis felt to be quite poor. However looking better. ? Potential VAD - Need to discuss GOC. PCM following. Family meeting planned later this week   2. Acute Pulmonary Embolism  - CT w/ rt lower lobe PE  - Limited echo w/ no RV strain  - Suspect incidental due HF - continue heparin   3. Shock Liver - due to shock. Improving with inotropes   4. AKI on Stage IIIa - Cardiorenal  - Baseline SCr 1.3 - Bumped to 2.11-> resolved with support, SCr 0.96 today  - continue milrinone   5. Large Rt Pleural Effusion  - s/p thoracentesis by CCM 10/12 - 1200 cc removed, transudative    6. H/o esophageal CA and throat CA - s/p chemo and XRT  7. Thrombocytopenia - PLTs trending up, 83>>68>>82>>90K - no bleeding  8. Possible HAP - cefepime per PCCM    Length of Stay: [place] PA-C  06/19/2021, 8:45 AM  Advanced  Heart Failure Team Pager [phone] (M-F; 7a - 4p)  Please contact [facility] for night-coverage after hours (4p -7a ) and weekends on [email or web address]   Agree with above.   On milrinone 0.375 (down from 0.5) Co-ox stable. Swan numbers ok. CVP up slightly but good diuresis yesterday. Denies SOB, orthopnea or PND. LFTs continue to improve  General:  Thin elderly male No  resp difficulty HEENT: normal Neck: supple. LIJ swan  Carotids 2+ bilat; no bruits. No lymphadenopathy or thryomegaly appreciated. Cor: PMI laterally displaced. Regular rate & rhythm. +s3 Lungs: clear Abdomen: soft, nontender, nondistended. No hepatosplenomegaly. No bruits or masses. Good bowel sounds. Extremities: no cyanosis, clubbing, rash, edema Neuro: alert & orientedx3, cranial nerves grossly intact. moves all 4 extremities w/o difficulty. Affect pleasant  Remains tenuous but improving with support. Wean milrinone down to 0.25. Increase hydral. Possibly can add Entresto tomorrow.   Long talk with him and his family about how tenuous he remains and that I am not sure that we will be able to wean him off of milrinone fully but we will continue to try.   CRITICAL CARE Performed by: Glori Bickers  Total critical care time: 35 minutes  Critical care time was exclusive of separately billable procedures and treating other patients.  Critical care was necessary to treat or prevent imminent or life-threatening deterioration.  Critical care was time spent personally by me (independent of midlevel providers or residents) on the following activities: development of treatment plan with patient and/or surrogate as well as nursing, discussions with consultants, evaluation of patient's response to treatment, examination of patient, obtaining history from patient or surrogate, ordering and performing treatments and interventions, ordering and review of laboratory studies, ordering and review of radiographic studies, pulse oximetry  and re-evaluation of patient's condition.  Glori Bickers, MD  12:18 PM

## 2021-06-19 NOTE — Progress Notes (Signed)
Peripherally Inserted Central Catheter Placement  The IV Nurse has discussed with the patient and/or persons authorized to consent for the patient, the purpose of this procedure and the potential benefits and risks involved with this procedure.  The benefits include less needle sticks, lab draws from the catheter, and the patient may be discharged home with the catheter. Risks include, but not limited to, infection, bleeding, blood clot (thrombus formation), and puncture of an artery; nerve damage and irregular heartbeat and possibility to perform a PICC exchange if needed/ordered by physician.  Alternatives to this procedure were also discussed.  Bard Power PICC patient education guide, fact sheet on infection prevention and patient information card has been provided to patient /or left at bedside.    PICC Placement Documentation  PICC Triple Lumen 03/49/17 PICC Right Basilic 36 cm 1 cm (Active)  Indication for Insertion or Continuance of Line Prolonged intravenous therapies 06/19/21 1200  Exposed Catheter (cm) 1 cm 06/19/21 1200  Site Assessment Clean;Dry;Intact 06/19/21 1200  Lumen #1 Status Flushed;Saline locked;Blood return noted 06/19/21 1200  Lumen #2 Status Flushed;Saline locked;Blood return noted 06/19/21 1200  Lumen #3 Status Flushed;Saline locked;Blood return noted 06/19/21 1200  Dressing Type Transparent;Securing device 06/19/21 1200  Dressing Status Clean;Dry;Intact 06/19/21 1200  Antimicrobial disc in place? Yes 06/19/21 1200  Safety Lock Not Applicable 91/50/56 9794  Line Care Connections checked and tightened 06/19/21 1200  Dressing Intervention New dressing 06/19/21 1200  Dressing Change Due 06/26/21 06/19/21 1200       Holley Bouche Renee 06/19/2021, 12:45 PM

## 2021-06-19 NOTE — Progress Notes (Deleted)
[place] for heparin Indication: pulmonary embolus  No Known Allergies  Patient Measurements: Height: 5\' 6"  (167.6 cm) Weight: 58.2 kg (128 lb 4.9 oz) IBW/kg (Calculated) : 63.8 Heparin Dosing Weight: 52.7 kg  Vital Signs: Temp: 98.8 F (37.1 C) (10/20 0445) Temp Source: Core (10/20 0400) BP: 149/77 (10/20 0300) Pulse Rate: 103 (10/20 0445)  Labs: Recent Labs    06/16/21 1309 06/16/21 2314 06/17/21 0443 06/17/21 0446 06/17/21 1000 06/17/21 1039 06/17/21 1800 06/18/21 0352 06/18/21 0752 06/19/21 0331  HGB  --   --   --  11.2*  --   --   --  10.6*  --  10.8*  HCT  --   --   --  34.5*  --   --   --  33.9*  --  34.5*  PLT  --   --   --  68*  --   --   --  82*  --  90*  APTT 32   < >  --   --   --    < > 119* 95*  --  89*  LABPROT 51.9*  --   --  38.6*  --   --   --   --   --  18.2*  INR 5.8*  --   --  3.9*  --   --   --   --   --  1.5*  HEPARINUNFRC  --   --   --   --  >1.10*  --   --   --  >1.10* >1.10*  CREATININE  --   --  1.60*  --   --   --   --  1.17  --  0.96   < > = values in this interval not displayed.     Estimated Creatinine Clearance: 58.9 mL/min (by C-G formula based on SCr of 0.96 mg/dL).    Medications:   Scheduled:   Infusions:   ceFEPime (MAXIPIME) IV Stopped (06/18/21 2256)   heparin 1,000 Units/hr (06/19/21 0400)   milrinone 0.375 mcg/kg/min (06/19/21 0400)   norepinephrine (LEVOPHED) Adult infusion      Assessment: Patient is a 70 year old male who presented with dyspnea, found to have an acute pulmonary embolism. Patient was initially started on heparin at [facility] where he was then switched to apixaban. Last apixaban dose on 10/16. Shock liver may also be contributing as AST/ALT are significantly elevated.INR decreased from 10.3 on 10/16 to 3.9 on 10/18. Pharmacy consulted to dose heparin. Platelets declined gradually to 68k. Hemoglobin and hematocrit trending down to 11.2 and 34.5  respectively.  APTT therapeutic at 89 sec this morning.  Heparin level is not correlating. No overt bleeding or complications noted.  Goal of Therapy:  Heparin level 0.3-0.7 units/ml Monitor platelets by anticoagulation protocol: Yes   Plan:  Continue IV heparin at 1000 units/hr. Check daily aPTT Daily CBC, aPTT, and Heparin level  Thank you for allowing pharmacy to participate in this patient's care.  Reatha Harps, PharmD PGY1 Pharmacy Resident 06/19/2021 6:52 AM Check [email or web address] for unit specific pharmacy number

## 2021-06-19 NOTE — Progress Notes (Signed)
NAME:  Collin Young, MRN:  [id number], DOB:  1951/02/27, LOS: 80 ADMISSION DATE:  06/11/2021, CONSULTATION DATE:  06/12/21 REFERRING MD:  Dr Marylyn Ishihara, CHIEF COMPLAINT: Pleural effusion  History of Present Illness:  Collin Young is a 70 y.o. male with a PMH significant for systolic and diastolic CHF, COPD, prior smoker, CAD, esophageal and prostate cancer s/p chemo and radiation, GERD, prior ETOH abuse, seizures, HTN, and HLD who presented to the ED  for complaints of shortness of breath.    On ED admit patient was treated for acute COPD exacerbation with BDs and supplemental oxygen. CTA chest was obtained that revealed lobar/subsegmental PE's on right with large right and small left pleural effusions. Given PE and pleural effusion PCCM consult for pulmonary eval.    Of note patient was recently admitted at this facility 10/3 - 10/4 for similar presented and was treated for acute CHF exacerbation.  10/17 PCCM re-consulted   Pertinent  Medical History  Combined systolic and congestive heart failure  COPD CAD Esophageal cancer  GERD Remote ETOH and tobacco use  Hx of seizures  HLD HTN Prostate cancer  Significant Hospital Events:  10/12 Admitted with shortness of breath, underwent thoracentesis with 1.2L clear yellow fluid removed - trasnudative by lights   10/13 cardiology consulted with recommendation to increased diuresing, note LFTs normal    10/15 creatinine increased to 2.11 with decreased urine output, also developed chest pain with frequent PVC 10/16 severally elevated LFTs suggesting acute shock liver  10/16  cytology with reactive mesothelial cells  10/17 transferred to [facility] for heart cath  Cath with cardiogenic shock with R > L heart failure   Interim History / Subjective:  Throat pain still doing ok, no new complaints. No SOB.  Objective   Blood pressure (!) 142/76, pulse (!) 111, temperature 97.9 F (36.6 C), resp. rate 18, height 5\' 6"  (1.676 m), weight  58.2 kg, SpO2 98 %. PAP: (34-83)/(10-56) 47/21 CVP:  [2 mmHg-34 mmHg] 8 mmHg PCWP:  [13 mmHg-22 mmHg] 22 mmHg CO:  [3.5 L/min-6.8 L/min] 3.5 L/min CI:  [2.1 L/min/m2-4.1 L/min/m2] 2.1 L/min/m2      Intake/Output Summary (Last 24 hours) at 06/19/2021 0949 Last data filed at 06/19/2021 0700 Gross per 24 hour  Intake 1641.34 ml  Output 3900 ml  Net -2258.66 ml    Filed Weights   06/17/21 0630 06/18/21 0500 06/19/21 0500  Weight: 57.6 kg 56.3 kg 58.2 kg    Examination: General: frail elderly man sitting up in the chair HEENT: [place]/AT, oral mucosa moist.  Neuro: awake and alert, moving all extemities CV: S1S2S3, RRR PULM:  breathing comfortably on RA, CTAB. Still coughing up thick brown sputum GI: soft, NT Extremities: mild pedal edema, no pretibial edema,  Skin: warm, dry, no rashes.  Coox 66% > 80% Na+ 131 BUN 29 Cr 0.96 AST 790 ALT 1812 T bili 1.4 H/H 10.8/34.5 Platelets 90 INR 1.5  Resolved Hospital Problem list     Assessment & Plan:  Cardiogenic shock with acute biventricular HFrEF Acute PE likely precipitating acute RV failure ICM, CAD -heparin infusion; recovering platelets so no need to cehck HIT. Can go back on DOAC once LFTs WNL -decrease milrinone per cardiology -daily coox -con't imdur, increase hydralazine -Ongoing GOC discussions. Family is aware he may not be able to get back to his previous functional status, but that remains the goal.  Acute liver injury due to congestive hepatopathy with ascites Coagulopathy due to liver injury -daily LFTs -  con't milrinone, afterload reduction, digoxin -periodic INR monitoring  Thrombocytopenia, improving Acute anemia -con't daily CBC -transfuse for Hb<7 or hemodynamically significant bleeding  AKI, suspect pre-renal due to low flow state. Baseline CKDIIIa -con't to optimize renal perfusion -agree with increased lasix dose -strict I/OS -renally dose meds, avoid nephrotoxic medications -monitor  daily  Acute respiratory failure due to acute pulm edema and R pleural effusion Possible pneumonia -con't to optimize heart failure -supplemental O2 PRN -7 days of cefepime for HAP  Hyperglycemia, improved -can stop accuchecks  Hyponatremia due to heart failure -daily monitoring  Severe protein energy malnutrition-- appetite improving -ensure TID, encourage PO intake -frequent snacks -con't MVI, thiamine  Deconditioning -OOB mobility  History of esophageal, piriform sinus throat, and prostate cancers -OP follow up  GoC -daughter updated at bedside today -Palliative care consult-- meeting again this weekend.   Best Practice (right click and "Reselect all SmartList Selections" daily)   Diet/type: Regular consistency (see orders) DVT prophylaxis: systemic heparin GI prophylaxis: PPI Lines: Central line Foley:  Yes, and it is still needed Code Status:  full code Last date of multidisciplinary goals of care discussion: 10/18- daughter updated at bedside, full scope for now, but understanding that there are limitations and he wants to go back to his previous QOL  Critical care time:       This patient is critically ill with multiple organ system failure which requires frequent high complexity decision making, assessment, support, evaluation, and titration of therapies. This was completed through the application of advanced monitoring technologies and extensive interpretation of multiple databases. During this encounter critical care time was devoted to patient care services described in this note for 34 minutes.  Julian Hy, DO 06/19/21 3:49 PM [facility]

## 2021-06-20 DIAGNOSIS — R64 Cachexia: Secondary | ICD-10-CM | POA: Diagnosis not present

## 2021-06-20 DIAGNOSIS — I2601 Septic pulmonary embolism with acute cor pulmonale: Secondary | ICD-10-CM | POA: Diagnosis not present

## 2021-06-20 DIAGNOSIS — D649 Anemia, unspecified: Secondary | ICD-10-CM

## 2021-06-20 DIAGNOSIS — R570 Cardiogenic shock: Secondary | ICD-10-CM | POA: Diagnosis not present

## 2021-06-20 DIAGNOSIS — I5023 Acute on chronic systolic (congestive) heart failure: Secondary | ICD-10-CM | POA: Diagnosis not present

## 2021-06-20 LAB — PROTIME-INR
INR: 1.2 (ref 0.8–1.2)
Prothrombin Time: 15 seconds (ref 11.4–15.2)

## 2021-06-20 LAB — CBC
HCT: 36 % — ABNORMAL LOW (ref 39.0–52.0)
Hemoglobin: 11.5 g/dL — ABNORMAL LOW (ref 13.0–17.0)
MCH: 27.1 pg (ref 26.0–34.0)
MCHC: 31.9 g/dL (ref 30.0–36.0)
MCV: 84.7 fL (ref 80.0–100.0)
Platelets: 91 10*3/uL — ABNORMAL LOW (ref 150–400)
RBC: 4.25 MIL/uL (ref 4.22–5.81)
RDW: 14.8 % (ref 11.5–15.5)
WBC: 7.2 10*3/uL (ref 4.0–10.5)
nRBC: 0 % (ref 0.0–0.2)

## 2021-06-20 LAB — COOXEMETRY PANEL
Carboxyhemoglobin: 2 % — ABNORMAL HIGH (ref 0.5–1.5)
Methemoglobin: 0.5 % (ref 0.0–1.5)
O2 Saturation: 63.6 %
Total hemoglobin: 11 g/dL — ABNORMAL LOW (ref 12.0–16.0)

## 2021-06-20 LAB — COMPREHENSIVE METABOLIC PANEL
ALT: 1316 U/L — ABNORMAL HIGH (ref 0–44)
AST: 454 U/L — ABNORMAL HIGH (ref 15–41)
Albumin: 2.6 g/dL — ABNORMAL LOW (ref 3.5–5.0)
Alkaline Phosphatase: 53 U/L (ref 38–126)
Anion gap: 7 (ref 5–15)
BUN: 16 mg/dL (ref 8–23)
CO2: 27 mmol/L (ref 22–32)
Calcium: 7.6 mg/dL — ABNORMAL LOW (ref 8.9–10.3)
Chloride: 93 mmol/L — ABNORMAL LOW (ref 98–111)
Creatinine, Ser: 0.82 mg/dL (ref 0.61–1.24)
GFR, Estimated: >60 mL/min (ref >60)
Glucose, Bld: 107 mg/dL — ABNORMAL HIGH (ref 70–99)
Potassium: 4.3 mmol/L (ref 3.5–5.1)
Sodium: 127 mmol/L — ABNORMAL LOW (ref 135–145)
Total Bilirubin: 1.3 mg/dL — ABNORMAL HIGH (ref 0.3–1.2)
Total Protein: 5.4 g/dL — ABNORMAL LOW (ref 6.5–8.1)

## 2021-06-20 LAB — HEPARIN LEVEL (UNFRACTIONATED): Heparin Unfractionated: 0.61 IU/mL (ref 0.30–0.70)

## 2021-06-20 LAB — DIGOXIN LEVEL: Digoxin Level: 0.3 ng/mL — ABNORMAL LOW (ref 0.8–2.0)

## 2021-06-20 LAB — APTT: aPTT: 33 seconds (ref 24–36)

## 2021-06-20 MED ORDER — SACUBITRIL-VALSARTAN 24-26 MG PO TABS
1.0000 | ORAL_TABLET | Freq: Two times a day (BID) | ORAL | Status: DC
  Administered 2021-06-20 – 2021-06-24 (×9): 1 via ORAL
  Filled 2021-06-20 (×9): qty 1

## 2021-06-20 MED ORDER — APIXABAN 5 MG PO TABS
5.0000 mg | ORAL_TABLET | Freq: Two times a day (BID) | ORAL | Status: DC
  Administered 2021-06-20 – 2021-06-24 (×9): 5 mg via ORAL
  Filled 2021-06-20 (×10): qty 1

## 2021-06-20 MED ORDER — PRAVASTATIN SODIUM 40 MG PO TABS
80.0000 mg | ORAL_TABLET | Freq: Every day | ORAL | Status: DC
  Administered 2021-06-20 – 2021-06-23 (×4): 80 mg via ORAL
  Filled 2021-06-20 (×4): qty 2

## 2021-06-20 MED ORDER — DIPHENHYDRAMINE HCL 25 MG PO CAPS
25.0000 mg | ORAL_CAPSULE | Freq: Every evening | ORAL | Status: DC | PRN
  Administered 2021-06-20: 25 mg via ORAL
  Filled 2021-06-20: qty 1

## 2021-06-20 MED ORDER — TORSEMIDE 20 MG PO TABS
20.0000 mg | ORAL_TABLET | Freq: Every day | ORAL | Status: DC
  Administered 2021-06-20: 20 mg via ORAL
  Filled 2021-06-20: qty 1

## 2021-06-20 MED ORDER — SODIUM CHLORIDE 0.9 % IV SOLN
2.0000 g | Freq: Three times a day (TID) | INTRAVENOUS | Status: AC
  Administered 2021-06-20 – 2021-06-22 (×7): 2 g via INTRAVENOUS
  Filled 2021-06-20 (×7): qty 2

## 2021-06-20 NOTE — TOC Initial Note (Signed)
Transition of Care [facility]) - Initial/Assessment Note    Patient Details  Name: Collin Young MRN: [id number] Date of Birth: [birth year]  Transition of [facility] CM/SW Contact:    Erenest Rasher, RN Phone Number: [phone] 06/20/2021, 4:56 PM  Clinical Narrative:                 HF TOC CM spoke to pt at bedside. States he was independent prior to hospital stay. Pt drives to his appts. Has dtr that will assist as needed. Offered choice for [facility]. Pt agreeable to [place]. Will need orders for [facility] RN with F2F. No DME needed. Contacted [facility] rep, Kenzie with new referral. Waiting confirmation.   Expected Discharge Plan: [place] Barriers to Discharge: Continued Medical Work up   Patient Goals and CMS Choice Patient states their goals for this hospitalization and ongoing recovery are:: feel better CMS Medicare.gov Compare Post Acute Care list provided to:: Patient Choice offered to / list presented to : Patient  Expected Discharge Plan and Services Expected Discharge Plan: [place] In-house Referral: Clinical Social Work Discharge Planning Services: CM Consult Post Acute Care Choice: [place] arrangements for the past 2 months: Apartment Expected Discharge Date:  (unknown)                           [place] Agency: [place] ([facility]) Date [place]: 06/20/21 Time [place]: 1655    Prior Living Arrangements/Services Living arrangements for the past 2 months: Apartment Lives with:: Self Patient language and need for interpreter reviewed:: Yes Do you feel safe going back to the place where you live?: Yes      Need for Family Participation in Patient Care: No (Comment) Care giver support system in place?: No (comment)   Criminal Activity/Legal Involvement Pertinent to Current Situation/Hospitalization: No - Comment as needed  Activities of Daily Living Home Assistive  Devices/Equipment: Dentures (specify type), Hearing aid (upper/lower dentures, bilateral hearing aids) ADL Screening (condition at time of admission) Patient's cognitive ability adequate to safely complete daily activities?: Yes Is the patient deaf or have difficulty hearing?: Yes (wears bilateral hearing aids) Does the patient have difficulty seeing, even when wearing glasses/contacts?: No Does the patient have difficulty concentrating, remembering, or making decisions?: No Patient able to express need for assistance with ADLs?: Yes Does the patient have difficulty dressing or bathing?: No Independently performs ADLs?: Yes (appropriate for developmental age) Does the patient have difficulty walking or climbing stairs?: Yes (worsening shortness of breath) Weakness of Legs: Both Weakness of Arms/Hands: None  Permission Sought/Granted Permission sought to share information with : Case Manager, Family Supports, PCP Permission granted to share information with : Yes, Verbal Permission Granted  Share Information with NAME: Vanessa [place]  Permission granted to share info w AGENCY: [place] granted to share info w Relationship: daughter  Permission granted to share info w Contact Information: [phone]  Emotional Assessment Appearance:: Appears stated age Attitude/Demeanor/Rapport: Gracious Affect (typically observed): Accepting Orientation: : Oriented to Self, Oriented to Place, Oriented to  Time, Oriented to Situation   Psych Involvement: No (comment)  Admission diagnosis:  Pulmonary embolism (HCC) [I26.99] Pneumothorax [J93.9] Patient Active Problem List   Diagnosis Date Noted   DNR (do not resuscitate)    Weakness generalized    Elevated LFTs    Pulmonary embolism ([place]) 06/11/2021   Acute on chronic  heart failure ([place]) 06/02/2021   GERD without esophagitis 05/08/2021   Mixed hyperlipidemia 05/08/2021   Hypertensive urgency 05/08/2021   Elevated troponin level  not due myocardial infarction 05/08/2021   Pure hypercholesterolemia 09/23/2020   Claudication in peripheral vascular disease ([place]) 01/25/2020   Late latent syphilis 01/15/2020   Coronary artery disease involving native coronary artery of native heart 07/05/2019   Chronic kidney disease, stage 3a ([place]) 07/05/2019   Combined systolic and diastolic heart failure ([place]) 09/26/2018   Malignant neoplasm of prostate ([place]) 06/17/2018   Medication management    Dysphagia    Palliative care by specialist    Ischemic cardiomyopathy    Congestive dilated cardiomyopathy ([place])    Acute on chronic systolic congestive heart failure ([place]) 01/02/2017   Malignant neoplasm of pyriform sinus ([place]) 09/28/2013   Piriform sinus tumor 09/24/2013   NONSPECIFIC ABN FINDING RAD & OTH EXAM GI TRACT 05/14/2009   PCP:  Darreld Mclean, MD Pharmacy:   [facility] [place], [address] [place] [address] Phone: [phone] Fax: [phone]     Social Determinants of Health ([place]) Interventions    Readmission Risk Interventions Readmission Risk Prevention Plan 06/03/2021  Transportation Screening Complete  PCP or Specialist Appt within 5-7 Days Complete  Home Care Screening Complete  Medication Review (RN CM) Complete  Some recent data might be hidden

## 2021-06-20 NOTE — Progress Notes (Signed)
Pharmacy Antibiotic Note  Collin Young is a 70 y.o. male admitted on 06/11/2021 with dyspnea and was found to have acute RLL PE without heart strain.  Pt also with acute on chronic systolic HF with cardiogenic shock, shock liver, AI and CKD, R pleural effusion. Pharmacy has been consulted for cefepime dosing for HCAP.  Patient is clinically improving, afebrile, WBCs trending down (12>7), and now off pressors and weaning milrinone. Cefepime has been coursed out for 7 days to end 10/23.   Pharmacy has been watch renal function, which has improved during admission. Cefepime dosing was changed from q12h to q8h today, based on CrCl that is now above 60 ml/min.  Of note, patient is also getting magic mouthwash for possible thrush and that is managed by CCM.  Plan: Cefepime 2 gm IV Q8 hours to end 10/23 Monitor WBC, temp, clinical course, cultures, renal function  Height: 5\' 6"  (167.6 cm) Weight: 55 kg (121 lb 4.1 oz) IBW/kg (Calculated) : 63.8  Temp (24hrs), Avg:98.4 F (36.9 C), Min:97.5 F (36.4 C), Max:98.9 F (37.2 C)  Recent Labs  Lab 06/16/21 0406 06/17/21 0443 06/17/21 0446 06/18/21 0352 06/19/21 0331 06/20/21 0328  WBC 9.2  --  5.4 6.4 7.5 7.2  CREATININE 1.98* 1.60*  --  1.17 0.96 0.82     Estimated Creatinine Clearance: 65.2 mL/min (by C-G formula based on SCr of 0.82 mg/dL).    No Known Allergies  Antimicrobials this admission: Ceftriaxone X 1 10/12 Doxycycline PO X 1 10/12 Cefepime 10/17>>10/23  Microbiology results: 10/12 COVID, flu A, flu B: negative 10/17 Hepatitis A, B C screen: negative 10/12 Pleural cx: NG/final; fungal cx pending (KOH negative) 10/16 Throat cx: pending 10/15 group A strep:   CXR showed pleural effusion, no infiltrates  Thank you for allowing pharmacy to be a part of this patient's care.  Cathrine Muster, PharmD PGY2 Cardiology Pharmacy Resident Phone: [phone] 06/20/2021  12:35 PM  Please check [email or web address] for  unit-specific pharmacy phone numbers.

## 2021-06-20 NOTE — Progress Notes (Signed)
Advanced Heart Failure Rounding Note   Subjective:    Now on milrinone 0.25. Co-ox stable w/ wean at 64% today.   Feels better. Scr now normalized. Denies SOB, orthopnea or PND. Weight is down   BP still high. CVP 9   Objective:   Weight Range:  Vital Signs:   Temp:  [97.7 F (36.5 C)-98.9 F (37.2 C)] 98.7 F (37.1 C) (10/21 0800) Pulse Rate:  [96-111] 108 (10/21 0700) Resp:  [10-33] 18 (10/21 0700) BP: (110-156)/(61-119) 155/76 (10/21 0700) SpO2:  [93 %-100 %] 97 % (10/21 0700) Weight:  [55 kg] 55 kg (10/21 0500) Last BM Date: 06/19/21  Weight change: Filed Weights   06/18/21 0500 06/19/21 0500 06/20/21 0500  Weight: 56.3 kg 58.2 kg 55 kg    Intake/Output:   Intake/Output Summary (Last 24 hours) at 06/20/2021 0852 Last data filed at 06/20/2021 0700 Gross per 24 hour  Intake 2038.02 ml  Output 3660 ml  Net -1621.98 ml      PHYSICAL EXAM: General:  Thin elderly male No resp difficulty HEENT: normal Neck: supple. JVP 9  Carotids 2+ bilat; no bruits. No lymphadenopathy or thryomegaly appreciated. Cor: PMI nondisplaced. Regular tachy + s3  Lungs: clear Abdomen: soft, nontender, nondistended. No hepatosplenomegaly. No bruits or masses. Good bowel sounds. Extremities: no cyanosis, clubbing, rash, edema Neuro: alert & orientedx3, cranial nerves grossly intact. moves all 4 extremities w/o difficulty. Affect pleasant   Telemetry: Sinus tach 100-110 Personally reviewed   Labs: Basic Metabolic Panel: Recent Labs  Lab 06/15/21 0329 06/16/21 0406 06/16/21 1151 06/17/21 0443 06/18/21 0352 06/19/21 0331 06/20/21 0328  NA 129* 131* 133*  135 133* 131* 131* 127*  K 4.5 4.7 4.2  3.9 3.6 3.6 4.3 4.3  CL 90* 92*  --  93* 94* 95* 93*  CO2 22 26  --  30 29 29 27   GLUCOSE 119* 102*  --  138* 94 85 107*  BUN 69* 80*  --  69* 48* 29* 16  CREATININE 2.07* 1.98*  --  1.60* 1.17 0.96 0.82  CALCIUM 9.3 9.3  --  8.6* 8.2* 8.1* 7.6*  MG 3.0* 3.0*  --  2.8*  --   2.1  --      Liver Function Tests: Recent Labs  Lab 06/16/21 0406 06/17/21 0443 06/18/21 0352 06/19/21 0331 06/20/21 0328  AST 4,265* 2,280* 1,340* 790* 454*  ALT 3,809* 3,159* 2,315* 1,812* 1,316*  ALKPHOS 54 48 54 55 53  BILITOT 1.5* 1.5* 1.7* 1.4* 1.3*  PROT 6.8 5.7* 5.7* 5.4* 5.4*  ALBUMIN 3.8 2.9* 2.8* 2.7* 2.6*    No results for input(s): LIPASE, AMYLASE in the last 168 hours. No results for input(s): AMMONIA in the last 168 hours.  CBC: Recent Labs  Lab 06/16/21 0406 06/16/21 1151 06/17/21 0446 06/18/21 0352 06/19/21 0331 06/20/21 0328  WBC 9.2  --  5.4 6.4 7.5 7.2  HGB 12.5* 13.3  13.3 11.2* 10.6* 10.8* 11.5*  HCT 38.2* 39.0  39.0 34.5* 33.9* 34.5* 36.0*  MCV 83.6  --  84.8 85.4 85.8 84.7  PLT 83*  --  68* 82* 90* 91*     Cardiac Enzymes: No results for input(s): CKTOTAL, CKMB, CKMBINDEX, TROPONINI in the last 168 hours.  BNP: BNP (last 3 results) Recent Labs    05/11/21 0523 06/02/21 0311 06/11/21 0225  BNP 1,797.3* 2,342.9* >4,500.0*     ProBNP (last 3 results) Recent Labs    05/14/21 0935  PROBNP 4,378.0*  Other results:  Imaging: [place] EKG SITE RITE  Result Date: 06/19/2021 If Site Rite image not attached, placement could not be confirmed due to current cardiac rhythm.  [place] EKG SITE RITE  Result Date: 06/19/2021 If Site Rite image not attached, placement could not be confirmed due to current cardiac rhythm.    Medications:     Scheduled Medications:  Chlorhexidine Gluconate Cloth  6 each Topical Daily   digoxin  0.125 mg Oral Daily   feeding supplement  237 mL Oral TID BM   guaiFENesin  600 mg Oral BID   hydrALAZINE  37.5 mg Oral Q8H   isosorbide mononitrate  30 mg Oral Daily   magic mouthwash w/lidocaine  5 mL Oral QID   multivitamin  15 mL Oral Daily   polyethylene glycol  17 g Oral BID   senna-docusate  2 tablet Oral BID   sodium chloride flush  10-40 mL Intracatheter Q12H   spironolactone  12.5 mg Oral  Daily   thiamine  100 mg Oral Daily    Infusions:  sodium chloride Stopped (06/20/21 0330)   ceFEPime (MAXIPIME) IV Stopped (06/19/21 2308)   heparin 1,000 Units/hr (06/20/21 0700)   milrinone 0.25 mcg/kg/min (06/20/21 0700)    PRN Medications: sodium chloride, acetaminophen (TYLENOL) oral liquid 160 mg/5 mL, albuterol, diphenhydrAMINE, lidocaine, menthol-cetylpyridinium, nitroGLYCERIN, ondansetron (ZOFRAN) IV, phenol, sodium chloride flush   Assessment/Plan:   1. Acute on Chronic Systolic Heart Failure -> Cardiogenic Shock  - Echo 2018 EF 10-15%, RV moderately reduced. LHC w/ MVCAD management medically - Echo 05/2020 EF down again, 35-40%. RV normal  - Echo 05/2021 EF down 25-30%, RV not well visualized. Mod MR and Mod AI. - Limited Echo 10/22: EF 20%, RV normal  - Admitted w/ a/c CHF w/ cardiogenic shock w/ AKI + Shock liver - RHC 06/16/21 w/ R>L HF and low output, CI 1.8. PAPi 1.3  - Now on milrinone 0.25. CI 2.9 today. Co-ox 64%.  LFTs improving, AKI resolved   - Reduced milrinone to 0.125 and advance GDMT  - Continue hydralazine 37.5 mg tid  - Continue Imdur 30  - Continue Spiro 12.5 daily  - Add Entresto 24/26 bid - CVP 9. Start torsemide 20 daily.  - Not felt to be VAD candidate due to cachexia and RV failure. Can reconsider as he improves   2. Acute Pulmonary Embolism  - CT w/ rt lower lobe PE  - Limited echo w/ no RV strain  - Suspect incidental due HF - switch to Eliquis   3. Shock Liver - due to shock. Improving with inotropes   4. AKI on Stage IIIa - Cardiorenal  - Baseline SCr 1.3 - Bumped to 2.11-> resolved with support, SCr 0.82 today    5. Large Rt Pleural Effusion  - s/p thoracentesis by CCM 10/12 - 1200 cc removed, transudative    6. H/o esophageal CA and throat CA - s/p chemo and XRT  7. Thrombocytopenia - PLTs trending up, 83>>68>>82>>90K> 91K - no bleeding  8. Possible HAP - cefepime per PCCM   9. Hyponatremia - Na 127 - restrict free  water  Length of Stay: 9   Derry Kassel MD  06/20/2021, 8:52 AM  Advanced Heart Failure Team Pager [phone] (M-F; [place])  Please contact [facility] for night-coverage after hours (4p -7a ) and weekends on [email or web address]

## 2021-06-20 NOTE — Progress Notes (Signed)
NAME:  Collin Young, MRN:  [id number], DOB:  1951-05-13, LOS: 9 ADMISSION DATE:  06/11/2021, CONSULTATION DATE:  06/12/21 REFERRING MD:  Dr Marylyn Ishihara, CHIEF COMPLAINT: Pleural effusion  History of Present Illness:  Collin Young is a 70 y.o. male with a PMH significant for systolic and diastolic CHF, COPD, prior smoker, CAD, esophageal and prostate cancer s/p chemo and radiation, GERD, prior ETOH abuse, seizures, HTN, and HLD who presented to the ED  for complaints of shortness of breath.    On ED admit patient was treated for acute COPD exacerbation with BDs and supplemental oxygen. CTA chest was obtained that revealed lobar/subsegmental PE's on right with large right and small left pleural effusions. Given PE and pleural effusion PCCM consult for pulmonary eval.    Of note patient was recently admitted at this facility 10/3 - 10/4 for similar presented and was treated for acute CHF exacerbation.  10/17 PCCM re-consulted   Pertinent  Medical History  Combined systolic and congestive heart failure  COPD CAD Esophageal cancer  GERD Remote ETOH and tobacco use  Hx of seizures  HLD HTN Prostate cancer  Significant Hospital Events:  10/12 Admitted with shortness of breath, underwent thoracentesis with 1.2L clear yellow fluid removed - trasnudative by lights   10/13 cardiology consulted with recommendation to increased diuresing, note LFTs normal    10/15 creatinine increased to 2.11 with decreased urine output, also developed chest pain with frequent PVC 10/16 severally elevated LFTs suggesting acute shock liver  10/16  cytology with reactive mesothelial cells  10/17 transferred to [facility] for heart cath  Cath with cardiogenic shock with R > L heart failure  10/20 weaning milrinone, adding hydralazine  Interim History / Subjective:  He denies complaints and has been walking around more in the past day  Objective   Blood pressure (!) 155/76, pulse (!) 108, temperature 98.7 F  (37.1 C), temperature source Oral, resp. rate 18, height 5\' 6"  (1.676 m), weight 55 kg, SpO2 97 %. PAP: (41-66)/(20-34) 42/20 CVP:  [7 mmHg-15 mmHg] 15 mmHg      Intake/Output Summary (Last 24 hours) at 06/20/2021 0904 Last data filed at 06/20/2021 0700 Gross per 24 hour  Intake 2038.02 ml  Output 3660 ml  Net -1621.98 ml    Filed Weights   06/18/21 0500 06/19/21 0500 06/20/21 0500  Weight: 56.3 kg 58.2 kg 55 kg    Examination: General: frail appearing elderly man sitting up in the recliner in NAD HEENT: /AT, eyes anicteric Neuro: awake, alert, hard of hearing but comprehension intact. Answers questions appropriately. Moving all extremities CV: S1S2, RRR PULM:  breathing comfortably on RA, rhales in R base, no wheezing. No conversational dyspnea. GI: soft, NT, ND Extremities:  pedal edema improving, no pretibial edema.  Minimal muscle mass.  No clubbing or cyanosis. RUE PICC. Skin: Warm, dry, no rashes  I/O -1.4L , net -5.7L  Coox 63% on milrinone 0.25 Dig 0.3 Na+ 127 BUN 16 Cr 0.82 AST 454 ALT 1316 T bili 1.3 H/H 10.8/34.5 Platelets 91 INR 1.2  Resolved Hospital Problem list     Assessment & Plan:  Cardiogenic shock with acute biventricular HFrEF Acute PE likely precipitating acute RV failure ICM, CAD -Transition back to [place] -Continue to decrease milrinone today per cardiology -daily coox -Continue Imdur and hydralazine -Ongoing GOC discussions. Family is aware he may not be able to get back to his previous functional status, but that remains the goal.  No family bedside today.  Acute liver injury due to congestive hepatopathy with ascites Coagulopathy due to liver injury -Continue to monitor -Continue heart failure management -periodic INR monitoring  Thrombocytopenia, improving Acute anemia -Daily CBC - Transfuse for hemoglobin less than 7 or hemodynamically significant bleeding. - No need for platelet transfusion at this level without  significant bleeding.  AKI, suspect pre-renal due to low flow state. Baseline CKDIIIa -Diuresis - Continue to optimize renal perfusion - Strict I's/O - Renally dose meds and avoid nephrotoxic meds  Acute respiratory failure due to acute pulm edema and R pleural effusion Possible pneumonia -7 days of Coverage with cefepime - Continue heart failure management  Hyperglycemia, improved -can stop accuchecks  Hyponatremia due to heart failure -con't monitoring  Severe protein energy malnutrition-- appetite improving -encourage PO intake, ensure -con't supplements  Deconditioning -OOB mobility  History of esophageal, piriform sinus throat, and prostate cancers -OP follow up  [facility] -Palliative care consult-- meeting again on 10/23   Best Practice (right click and "Reselect all SmartList Selections" daily)   Diet/type: Regular consistency (see orders) DVT prophylaxis: systemic heparin GI prophylaxis: PPI Lines: Central line Foley:  Yes, and it is still needed Code Status:  full code Last date of multidisciplinary goals of care discussion: 10/18- daughter updated at bedside, full scope for now, but understanding that there are limitations and he wants to go back to his previous QOL  Critical care time:     This patient is critically ill with multiple organ system failure which requires frequent high complexity decision making, assessment, support, evaluation, and titration of therapies. This was completed through the application of advanced monitoring technologies and extensive interpretation of multiple databases. During this encounter critical care time was devoted to patient care services described in this note for 32 minutes.  Julian Hy, DO 06/20/21 11:41 AM [facility]

## 2021-06-20 NOTE — Consult Note (Addendum)
   [facility] Inpatient Consult   06/20/2021  MIQUEAS WHILDEN [birth year] [id number]  [facility] [[facility]] Patient: [employer]  Primary Care Provider:  Darreld Mclean, MD, [place], Embedded  Patient screened for length of stay, ICU, hospitalization with noted high risk score for unplanned readmission risk  with less than 30 days readmission.  Review of patient's medical record briefly and reveals patient is a patient in an Embedded practice that has a chronic care management program and team. Currently, disposition is not completed ongoing consults noted.  Patient is being followed by Advanced HF team noted inpatient.  Plan:  Continue to follow progress and disposition to assess for post hospital care management needs.    For questions contact:   Natividad Brood, RN BSN [facility] Liaison  [phone] business mobile phone Toll free office [phone]  Fax number: [phone] [email or web address]

## 2021-06-21 DIAGNOSIS — I50811 Acute right heart failure: Secondary | ICD-10-CM | POA: Diagnosis not present

## 2021-06-21 DIAGNOSIS — I5023 Acute on chronic systolic (congestive) heart failure: Secondary | ICD-10-CM | POA: Diagnosis not present

## 2021-06-21 DIAGNOSIS — E871 Hypo-osmolality and hyponatremia: Secondary | ICD-10-CM

## 2021-06-21 DIAGNOSIS — I2601 Septic pulmonary embolism with acute cor pulmonale: Secondary | ICD-10-CM | POA: Diagnosis not present

## 2021-06-21 LAB — SODIUM: Sodium: 128 mmol/L — ABNORMAL LOW (ref 135–145)

## 2021-06-21 LAB — BASIC METABOLIC PANEL
Anion gap: 6 (ref 5–15)
BUN: 18 mg/dL (ref 8–23)
CO2: 27 mmol/L (ref 22–32)
Calcium: 7.6 mg/dL — ABNORMAL LOW (ref 8.9–10.3)
Chloride: 92 mmol/L — ABNORMAL LOW (ref 98–111)
Creatinine, Ser: 0.91 mg/dL (ref 0.61–1.24)
GFR, Estimated: >60 mL/min (ref >60)
Glucose, Bld: 123 mg/dL — ABNORMAL HIGH (ref 70–99)
Potassium: 4.2 mmol/L (ref 3.5–5.1)
Sodium: 125 mmol/L — ABNORMAL LOW (ref 135–145)

## 2021-06-21 LAB — CBC
HCT: 37.1 % — ABNORMAL LOW (ref 39.0–52.0)
Hemoglobin: 11.8 g/dL — ABNORMAL LOW (ref 13.0–17.0)
MCH: 26.9 pg (ref 26.0–34.0)
MCHC: 31.8 g/dL (ref 30.0–36.0)
MCV: 84.5 fL (ref 80.0–100.0)
Platelets: 105 10*3/uL — ABNORMAL LOW (ref 150–400)
RBC: 4.39 MIL/uL (ref 4.22–5.81)
RDW: 14.9 % (ref 11.5–15.5)
WBC: 7.5 10*3/uL (ref 4.0–10.5)
nRBC: 0 % (ref 0.0–0.2)

## 2021-06-21 LAB — MAGNESIUM: Magnesium: 2 mg/dL (ref 1.7–2.4)

## 2021-06-21 LAB — COOXEMETRY PANEL
Carboxyhemoglobin: 2 % — ABNORMAL HIGH (ref 0.5–1.5)
Methemoglobin: 0.5 % (ref 0.0–1.5)
O2 Saturation: 74.1 %
Total hemoglobin: 12 g/dL (ref 12.0–16.0)

## 2021-06-21 LAB — APTT: aPTT: 29 seconds (ref 24–36)

## 2021-06-21 MED ORDER — TOLVAPTAN 15 MG PO TABS
15.0000 mg | ORAL_TABLET | Freq: Once | ORAL | Status: AC
  Administered 2021-06-21: 15 mg via ORAL
  Filled 2021-06-21: qty 1

## 2021-06-21 MED ORDER — SODIUM CHLORIDE 0.9 % IV BOLUS
500.0000 mL | Freq: Once | INTRAVENOUS | Status: AC
  Administered 2021-06-21: 500 mL via INTRAVENOUS

## 2021-06-21 NOTE — Progress Notes (Addendum)
Advanced Heart Failure Rounding Note   Subjective:    Now on milrinone 0.125. Co-ox stable w/ wean at 74% today.   Continues to feel better. Walking unit. Appetite good. No CP or SOB. On Eliquis   Serum Na 127 -> 125  CVP 1-2   Objective:   Weight Range:  Vital Signs:   Temp:  [97.5 F (36.4 C)-99.3 F (37.4 C)] 99.3 F (37.4 C) (10/22 0300) Pulse Rate:  [99-112] 112 (10/21 2000) Resp:  [11-27] 20 (10/22 0600) BP: (120-145)/(67-89) 120/81 (10/22 0600) SpO2:  [95 %-99 %] 95 % (10/21 2000) Last BM Date: 06/20/21  Weight change: Filed Weights   06/18/21 0500 06/19/21 0500 06/20/21 0500  Weight: 56.3 kg 58.2 kg 55 kg    Intake/Output:   Intake/Output Summary (Last 24 hours) at 06/21/2021 0710 Last data filed at 06/20/2021 2100 Gross per 24 hour  Intake 1151.44 ml  Output 1710 ml  Net -558.56 ml      PHYSICAL EXAM: General:  Thin elderly male No resp difficulty HEENT: normal Neck: supple. no JVD. Carotids 2+ bilat; no bruits. No lymphadenopathy or thryomegaly appreciated. Cor: PMI laterally displaced. Regular tachy + s3 Lungs: clear Abdomen: soft, nontender, nondistended. No hepatosplenomegaly. No bruits or masses. Good bowel sounds. Extremities: no cyanosis, clubbing, rash, edema Neuro: alert & orientedx3, cranial nerves grossly intact. moves all 4 extremities w/o difficulty. Affect pleasant   Telemetry: Sinus tach 100-115 Personally reviewed   Labs: Basic Metabolic Panel: Recent Labs  Lab 06/15/21 0329 06/16/21 0406 06/16/21 1151 06/17/21 0443 06/18/21 0352 06/19/21 0331 06/20/21 0328 06/21/21 0207  NA 129* 131*   < > 133* 131* 131* 127* 125*  K 4.5 4.7   < > 3.6 3.6 4.3 4.3 4.2  CL 90* 92*  --  93* 94* 95* 93* 92*  CO2 22 26  --  30 29 29 27 27   GLUCOSE 119* 102*  --  138* 94 85 107* 123*  BUN 69* 80*  --  69* 48* 29* 16 18  CREATININE 2.07* 1.98*  --  1.60* 1.17 0.96 0.82 0.91  CALCIUM 9.3 9.3  --  8.6* 8.2* 8.1* 7.6* 7.6*  MG 3.0*  3.0*  --  2.8*  --  2.1  --  2.0   < > = values in this interval not displayed.     Liver Function Tests: Recent Labs  Lab 06/16/21 0406 06/17/21 0443 06/18/21 0352 06/19/21 0331 06/20/21 0328  AST 4,265* 2,280* 1,340* 790* 454*  ALT 3,809* 3,159* 2,315* 1,812* 1,316*  ALKPHOS 54 48 54 55 53  BILITOT 1.5* 1.5* 1.7* 1.4* 1.3*  PROT 6.8 5.7* 5.7* 5.4* 5.4*  ALBUMIN 3.8 2.9* 2.8* 2.7* 2.6*    No results for input(s): LIPASE, AMYLASE in the last 168 hours. No results for input(s): AMMONIA in the last 168 hours.  CBC: Recent Labs  Lab 06/17/21 0446 06/18/21 0352 06/19/21 0331 06/20/21 0328 06/21/21 0207  WBC 5.4 6.4 7.5 7.2 7.5  HGB 11.2* 10.6* 10.8* 11.5* 11.8*  HCT 34.5* 33.9* 34.5* 36.0* 37.1*  MCV 84.8 85.4 85.8 84.7 84.5  PLT 68* 82* 90* 91* 105*     Cardiac Enzymes: No results for input(s): CKTOTAL, CKMB, CKMBINDEX, TROPONINI in the last 168 hours.  BNP: BNP (last 3 results) Recent Labs    05/11/21 0523 06/02/21 0311 06/11/21 0225  BNP 1,797.3* 2,342.9* >4,500.0*     ProBNP (last 3 results) Recent Labs    05/14/21 0935  PROBNP 4,378.0*  Other results:  Imaging: [place] EKG SITE RITE  Result Date: 06/19/2021 If Site Rite image not attached, placement could not be confirmed due to current cardiac rhythm.  [place] EKG SITE RITE  Result Date: 06/19/2021 If Site Rite image not attached, placement could not be confirmed due to current cardiac rhythm.    Medications:     Scheduled Medications:  apixaban  5 mg Oral BID   Chlorhexidine Gluconate Cloth  6 each Topical Daily   digoxin  0.125 mg Oral Daily   feeding supplement  237 mL Oral TID BM   guaiFENesin  600 mg Oral BID   hydrALAZINE  37.5 mg Oral Q8H   isosorbide mononitrate  30 mg Oral Daily   magic mouthwash w/lidocaine  5 mL Oral QID   multivitamin  15 mL Oral Daily   polyethylene glycol  17 g Oral BID   pravastatin  80 mg Oral q1800   sacubitril-valsartan  1 tablet Oral BID    senna-docusate  2 tablet Oral BID   sodium chloride flush  10-40 mL Intracatheter Q12H   spironolactone  12.5 mg Oral Daily   thiamine  100 mg Oral Daily   torsemide  20 mg Oral Daily    Infusions:  sodium chloride Stopped (06/20/21 0330)   ceFEPime (MAXIPIME) IV 2 g (06/21/21 0636)   milrinone 0.125 mcg/kg/min (06/21/21 0206)    PRN Medications: sodium chloride, acetaminophen (TYLENOL) oral liquid 160 mg/5 mL, albuterol, diphenhydrAMINE, lidocaine, menthol-cetylpyridinium, nitroGLYCERIN, ondansetron (ZOFRAN) IV, phenol, sodium chloride flush   Assessment/Plan:   1. Acute on Chronic Systolic Heart Failure -> Cardiogenic Shock  - Echo 2018 EF 10-15%, RV moderately reduced. LHC w/ MVCAD management medically - Echo 05/2020 EF down again, 35-40%. RV normal  - Echo 05/2021 EF down 25-30%, RV not well visualized. Mod MR and Mod AI. - Limited Echo 10/22: EF 20%, RV normal  - Admitted w/ a/c CHF w/ cardiogenic shock w/ AKI + Shock liver - RHC 06/16/21 w/ R>L HF and low output, CI 1.8. PAPi 1.3  - Now on milrinone 0.125. Co-ox 74%.  LFTs improving, AKI resolved   - Stop milrinone - CVP 1-2 - Continue hydral 37.5 mg tid  - Continue Imdur 30  - Continue Spiro 12.5 daily  - Continue Entresto 24/26 bid - Stop torsemide. Give 500cc NS today (he is getting tolvaptan) - Consider restarting Farxiga soon - Will give one dose tolvaptan today - Not felt to be VAD candidate due to cachexia and RV failure. Can reconsider as he improves   2. Acute Pulmonary Embolism  - CT w/ rt lower lobe PE  - Limited echo w/ no RV strain  - Suspect incidental due HF - On Eliquis   3. Shock Liver - due to shock. Improving with inotropes   4. AKI on Stage IIIa - Cardiorenal  - Baseline SCr 1.3 - Bumped to 2.11-> resolved with support, SCr 0.82 today    5. Large Rt Pleural Effusion  - s/p thoracentesis by CCM 10/12 - 1200 cc removed, transudative    6. H/o esophageal CA and throat CA - s/p chemo and  XRT  7. Thrombocytopenia - PLTs trending up, 83>>68>>82>>90K> 91K -> 105k - no bleeding  8. Possible HAP - cefepime per PCCM   9. Hyponatremia - Na 125 - restrict free water - Tolvaptan x 1  Length of Stay: 10   Glori Bickers MD  06/21/2021, 7:10 AM  Advanced Heart Failure Team Pager [phone] (M-F; 7a - 4p)  Please contact [facility] for night-coverage after hours (4p -7a ) and weekends on [email or web address]

## 2021-06-21 NOTE — Progress Notes (Addendum)
NAME:  Collin Young, MRN:  [id number], DOB:  1950/12/10, LOS: 57 ADMISSION DATE:  06/11/2021, CONSULTATION DATE:  06/12/21 REFERRING MD:  Dr Marylyn Ishihara, CHIEF COMPLAINT: Pleural effusion  History of Present Illness:  Collin Young is a 70 y.o. male with a PMH significant for systolic and diastolic CHF, COPD, prior smoker, CAD, esophageal and prostate cancer s/p chemo and radiation, GERD, prior ETOH abuse, seizures, HTN, and HLD who presented to the ED  for complaints of shortness of breath.    On ED admit patient was treated for acute COPD exacerbation with BDs and supplemental oxygen. CTA chest was obtained that revealed lobar/subsegmental PE's on right with large right and small left pleural effusions. Given PE and pleural effusion PCCM consult for pulmonary eval.    Of note patient was recently admitted at this facility 10/3 - 10/4 for similar presented and was treated for acute CHF exacerbation.  10/17 PCCM re-consulted   Pertinent  Medical History  Combined systolic and congestive heart failure  COPD CAD Esophageal cancer  GERD Remote ETOH and tobacco use  Hx of seizures  HLD HTN Prostate cancer  Significant Hospital Events:  10/12 Admitted with shortness of breath, underwent thoracentesis with 1.2L clear yellow fluid removed - trasnudative by lights   10/13 cardiology consulted with recommendation to increased diuresing, note LFTs normal    10/15 creatinine increased to 2.11 with decreased urine output, also developed chest pain with frequent PVC 10/16 severally elevated LFTs suggesting acute shock liver  10/16  cytology with reactive mesothelial cells  10/17 transferred to [facility] for heart cath  Cath with cardiogenic shock with R > L heart failure  10/20 weaning milrinone, adding hydralazine  Interim History / Subjective:  He feels great and walked 3 laps yesterday.  Objective   Blood pressure 114/79, pulse (!) 119, temperature 97.6 F (36.4 C), temperature  source Oral, resp. rate 16, height 5\' 6"  (1.676 m), weight 55 kg, SpO2 96 %. CVP:  [2 mmHg-5 mmHg] 2 mmHg      Intake/Output Summary (Last 24 hours) at 06/21/2021 1059 Last data filed at 06/21/2021 2202 Gross per 24 hour  Intake 1100.39 ml  Output 1710 ml  Net -609.61 ml    Filed Weights   06/18/21 0500 06/19/21 0500 06/20/21 0500  Weight: 56.3 kg 58.2 kg 55 kg    Examination: General: elderly man sitting up in the chair in NAD HEENT: [place]/AT, eyes anicteric Neuro: awake, alert, answering questions appropriately, moving all extremities CV: S2, RRR PULM:  breathing comfortably on RA, CTAB, no conversational dyspnea GI: soft, NT, ND Extremities:  PICC in RUE, no LE edema Skin: warm, dry, no rashes  I/O -500cc , net -6.3L  Coox 74%  Na+ 125 BUN 18 Cr 0.91 H/H 11.8/34.5 Platelets 91 INR 1.2  Resolved Hospital Problem list     Assessment & Plan:  Cardiogenic shock with acute biventricular HFrEF Acute PE likely precipitating acute RV failure ICM, CAD -Con't DOAC -off milrinone -daily coox -Continue Imdur, Entresto, hydralazine, spironolactone. Holding Bblocker until fully compensated. Holding farxiga with low CVP.   Acute liver injury due to congestive hepatopathy with ascites Coagulopathy due to liver injury -periodic monitoring -Continue heart failure management  Thrombocytopenia, improving Acute anemia -Con't monitoring - Transfuse for hemoglobin less than 7 or hemodynamically significant bleeding.  AKI, suspect pre-renal due to low flow state. Baseline CKDIIIa -holding additional diuresis - con't to monitor Cr and K+ with spironolactone and Entresto - Strict I's/O - Renally  dose meds and avoid nephrotoxic meds  Acute respiratory failure due to acute pulm edema and R pleural effusion> resolved clinically. Possible pneumonia - complete 7 days of Coverage with cefepime - Continue heart failure management  Hyperglycemia, resolved  Hyponatremia due to  heart failure -con't monitoring -tolvaptan today  Severe protein energy malnutrition-- appetite & PO intake improving -encourage PO intake, ensure -con't supplements  Deconditioning -OOB mobility  History of esophageal, piriform sinus throat, and prostate cancers -OP follow up  [facility] -Palliative care consult-- meeting again on 10/23   Best Practice (right click and "Reselect all SmartList Selections" daily)   Diet/type: Regular consistency (see orders) DVT prophylaxis: DOAC GI prophylaxis: PPI Lines: Central line Foley:  Yes, and it is still needed Code Status:  full code Last date of multidisciplinary goals of care discussion: 10/18- daughter updated at bedside, full scope for now, but understanding that there are limitations and he wants to go back to his previous QOL  Critical care time:     Julian Hy, DO 06/21/21 11:21 AM [facility]

## 2021-06-22 DIAGNOSIS — R0602 Shortness of breath: Secondary | ICD-10-CM

## 2021-06-22 DIAGNOSIS — R7989 Other specified abnormal findings of blood chemistry: Secondary | ICD-10-CM | POA: Diagnosis not present

## 2021-06-22 DIAGNOSIS — I2601 Septic pulmonary embolism with acute cor pulmonale: Secondary | ICD-10-CM | POA: Diagnosis not present

## 2021-06-22 DIAGNOSIS — I5023 Acute on chronic systolic (congestive) heart failure: Secondary | ICD-10-CM | POA: Diagnosis not present

## 2021-06-22 LAB — CBC
HCT: 40.9 % (ref 39.0–52.0)
Hemoglobin: 12.4 g/dL — ABNORMAL LOW (ref 13.0–17.0)
MCH: 26.2 pg (ref 26.0–34.0)
MCHC: 30.3 g/dL (ref 30.0–36.0)
MCV: 86.3 fL (ref 80.0–100.0)
Platelets: 125 10*3/uL — ABNORMAL LOW (ref 150–400)
RBC: 4.74 MIL/uL (ref 4.22–5.81)
RDW: 15 % (ref 11.5–15.5)
WBC: 7.8 10*3/uL (ref 4.0–10.5)
nRBC: 0 % (ref 0.0–0.2)

## 2021-06-22 LAB — COOXEMETRY PANEL
Carboxyhemoglobin: 1.5 % (ref 0.5–1.5)
Methemoglobin: 0.8 % (ref 0.0–1.5)
O2 Saturation: 71.4 %
Total hemoglobin: 12.8 g/dL (ref 12.0–16.0)

## 2021-06-22 LAB — BASIC METABOLIC PANEL
Anion gap: 6 (ref 5–15)
BUN: 18 mg/dL (ref 8–23)
CO2: 27 mmol/L (ref 22–32)
Calcium: 8.7 mg/dL — ABNORMAL LOW (ref 8.9–10.3)
Chloride: 99 mmol/L (ref 98–111)
Creatinine, Ser: 0.9 mg/dL (ref 0.61–1.24)
GFR, Estimated: >60 mL/min (ref >60)
Glucose, Bld: 138 mg/dL — ABNORMAL HIGH (ref 70–99)
Potassium: 4.6 mmol/L (ref 3.5–5.1)
Sodium: 132 mmol/L — ABNORMAL LOW (ref 135–145)

## 2021-06-22 LAB — MAGNESIUM: Magnesium: 2.1 mg/dL (ref 1.7–2.4)

## 2021-06-22 NOTE — Plan of Care (Signed)

## 2021-06-22 NOTE — Progress Notes (Addendum)
Advanced Heart Failure Rounding Note   Subjective:    Milrinone stopped yesterday. Co-ox stable at 71%  Denies CP or SOB. Walking halls. Appetite good.   Volume was low yesterday. Got 500cc IVF. And tolvaptan for NA 125. Up to 132 today   Objective:   Weight Range:  Vital Signs:   Temp:  [97.8 F (36.6 C)-98.7 F (37.1 C)] 97.8 F (36.6 C) (10/23 0718) Pulse Rate:  [100-119] 109 (10/23 0800) Resp:  [13-28] 17 (10/23 0800) BP: (105-142)/(59-87) 110/59 (10/23 0800) SpO2:  [96 %-100 %] 98 % (10/23 0800) Weight:  [54.4 kg] 54.4 kg (10/23 0632) Last BM Date: 06/21/21  Weight change: Filed Weights   06/19/21 0500 06/20/21 0500 06/22/21 6812  Weight: 58.2 kg 55 kg 54.4 kg    Intake/Output:   Intake/Output Summary (Last 24 hours) at 06/22/2021 1047 Last data filed at 06/22/2021 0800 Gross per 24 hour  Intake 2200.57 ml  Output 4300 ml  Net -2099.43 ml      PHYSICAL EXAM: General:  Thin elderly male No resp difficulty HEENT: normal Neck: supple. no JVD. Carotids 2+ bilat; no bruits. No lymphadenopathy or thryomegaly appreciated. Cor: PMI laterally displaced. Sinus tach No rubs, gallops or murmurs. Lungs: clear Abdomen: soft, nontender, nondistended. No hepatosplenomegaly. No bruits or masses. Good bowel sounds. Extremities: no cyanosis, clubbing, rash, edema Neuro: alert & orientedx3, cranial nerves grossly intact. moves all 4 extremities w/o difficulty. Affect pleasant  Telemetry: Sinus tach 100-110 Personally reviewed   Labs: Basic Metabolic Panel: Recent Labs  Lab 06/16/21 0406 06/16/21 1151 06/17/21 0443 06/18/21 0352 06/19/21 0331 06/20/21 0328 06/21/21 0207 06/21/21 1630 06/22/21 0443  NA 131*   < > 133* 131* 131* 127* 125* 128* 132*  K 4.7   < > 3.6 3.6 4.3 4.3 4.2  --  4.6  CL 92*  --  93* 94* 95* 93* 92*  --  99  CO2 26  --  30 29 29 27 27   --  27  GLUCOSE 102*  --  138* 94 85 107* 123*  --  138*  BUN 80*  --  69* 48* 29* 16 18  --  18   CREATININE 1.98*  --  1.60* 1.17 0.96 0.82 0.91  --  0.90  CALCIUM 9.3  --  8.6* 8.2* 8.1* 7.6* 7.6*  --  8.7*  MG 3.0*  --  2.8*  --  2.1  --  2.0  --  2.1   < > = values in this interval not displayed.     Liver Function Tests: Recent Labs  Lab 06/16/21 0406 06/17/21 0443 06/18/21 0352 06/19/21 0331 06/20/21 0328  AST 4,265* 2,280* 1,340* 790* 454*  ALT 3,809* 3,159* 2,315* 1,812* 1,316*  ALKPHOS 54 48 54 55 53  BILITOT 1.5* 1.5* 1.7* 1.4* 1.3*  PROT 6.8 5.7* 5.7* 5.4* 5.4*  ALBUMIN 3.8 2.9* 2.8* 2.7* 2.6*    No results for input(s): LIPASE, AMYLASE in the last 168 hours. No results for input(s): AMMONIA in the last 168 hours.  CBC: Recent Labs  Lab 06/18/21 0352 06/19/21 0331 06/20/21 0328 06/21/21 0207 06/22/21 0443  WBC 6.4 7.5 7.2 7.5 7.8  HGB 10.6* 10.8* 11.5* 11.8* 12.4*  HCT 33.9* 34.5* 36.0* 37.1* 40.9  MCV 85.4 85.8 84.7 84.5 86.3  PLT 82* 90* 91* 105* 125*     Cardiac Enzymes: No results for input(s): CKTOTAL, CKMB, CKMBINDEX, TROPONINI in the last 168 hours.  BNP: BNP (last 3 results) Recent Labs  05/11/21 0523 06/02/21 0311 06/11/21 0225  BNP 1,797.3* 2,342.9* >4,500.0*     ProBNP (last 3 results) Recent Labs    05/14/21 0935  PROBNP 4,378.0*       Other results:  Imaging: No results found.   Medications:     Scheduled Medications:  apixaban  5 mg Oral BID   Chlorhexidine Gluconate Cloth  6 each Topical Daily   digoxin  0.125 mg Oral Daily   feeding supplement  237 mL Oral TID BM   guaiFENesin  600 mg Oral BID   hydrALAZINE  37.5 mg Oral Q8H   isosorbide mononitrate  30 mg Oral Daily   magic mouthwash w/lidocaine  5 mL Oral QID   multivitamin  15 mL Oral Daily   polyethylene glycol  17 g Oral BID   pravastatin  80 mg Oral q1800   sacubitril-valsartan  1 tablet Oral BID   senna-docusate  2 tablet Oral BID   sodium chloride flush  10-40 mL Intracatheter Q12H   spironolactone  12.5 mg Oral Daily   thiamine  100  mg Oral Daily    Infusions:  sodium chloride Stopped (06/20/21 0330)   ceFEPime (MAXIPIME) IV 2 g (06/22/21 0635)    PRN Medications: sodium chloride, acetaminophen (TYLENOL) oral liquid 160 mg/5 mL, albuterol, diphenhydrAMINE, lidocaine, menthol-cetylpyridinium, nitroGLYCERIN, ondansetron (ZOFRAN) IV, phenol, sodium chloride flush   Assessment/Plan:   1. Acute on Chronic Systolic Heart Failure -> Cardiogenic Shock  - Echo 2018 EF 10-15%, RV moderately reduced. LHC w/ MVCAD management medically - Echo 05/2020 EF down again, 35-40%. RV normal  - Echo 05/2021 EF down 25-30%, RV not well visualized. Mod MR and Mod AI. - Limited Echo 10/22: EF 20%, RV normal  - Admitted w/ a/c CHF w/ cardiogenic shock w/ AKI + Shock liver - RHC 06/16/21 w/ R>L HF and low output, CI 1.8. PAPi 1.3  - Milrinone stopped 10/21 - Continue hydral 37.5 mg tid  - Continue Imdur 30  - Continue Spiro 12.5 daily  - Continue Entresto 24/26 bid - Hold diuretics - Consider restarting Farxiga soon - Possible ivabradine tomorrow - Not felt to be VAD candidate due to cachexia and RV failure. Can reconsider as he improves   2. Acute Pulmonary Embolism  - CT w/ rt lower lobe PE  - Limited echo w/ no RV strain  - Suspect incidental due HF - On Eliquis   3. Shock Liver - due to shock. Improving with inotropes   4. AKI on Stage IIIa - Cardiorenal  - Baseline SCr 1.3 - Bumped to 2.11-> resolved with support, SCr 0.90 today    5. Large Rt Pleural Effusion  - s/p thoracentesis by CCM 10/12 - 1200 cc removed, transudative    6. H/o esophageal CA and throat CA - s/p chemo and XRT  7. Thrombocytopenia - PLTs trending up, 83>>68>>-> 105k -> 125k - no bleeding  8. Possible HAP - cefepime per PCCM. Ends today  9. Hyponatremia - Na 132 - restrict free water  Will consult PT/OT  Length of Stay: 11   Glori Bickers MD  06/22/2021, 10:47 AM  Advanced Heart Failure Team Pager [phone] (M-F; [place])   Please contact [facility] for night-coverage after hours (4p -7a ) and weekends on [email or web address]

## 2021-06-22 NOTE — Progress Notes (Signed)
PROGRESS NOTE    Collin Young  NTI:[id number] DOB: 06-04-1951 DOA: 06/11/2021 PCP: Darreld Mclean, MD  Outpatient Specialists:   Brief Narrative:  Patient is [name] with past medical history significant for systolic and diastolic CHF, COPD, prior smoker, CAD, esophageal and prostate cancer s/p chemo and radiation, GERD, prior ETOH abuse, seizures, HTN, and HLD.  Patient presented to emergency room with shortness of breath.  Patient has been managed for COPD with exacerbation, acute on chronic systolic heart failure with EF of 20%, large right-sided pleural effusion, acute kidney injury versus acute on chronic kidney injury with acute component thought to be cardiorenal and lobar to segmental pulmonary embolism in the right lower lobe.  Patient was transferred back to the hospitalist team yesterday.  Heart failure team is assisting with patient's congestive heart failure management.  Patient is currently on Entresto, Imdur, hydralazine, digoxin and Aldactone.  Patient is also on Eliquis for pulmonary embolism.  Patient will complete course of cefepime for possible hospital associated pneumonia.  CT chest, echocardiogram and other work-up reviewed.  Patient has also undergone thoracentesis during this admission.  Patient was seen today alongside patient's daughter.  No new complaints.   Assessment & Plan:   Active Problems:   Acute on chronic systolic congestive heart failure ([place])   Palliative care by specialist   Pulmonary embolism ([place])   Elevated LFTs   DNR (do not resuscitate)   Weakness generalized  Acute on chronic systolic heart failure: -Heart failure team is directing care. -Milrinone has been discontinued. -Patient is currently on Imdur, hydralazine, Entresto and Aldactone. Wilder Glade will be considered.   -Acute kidney injury has improved with optimization of the heart failure treatment. -Further management depend on hospital course.  Acute  pulmonary embolism: -Patient is on Eliquis. -Echocardiogram did not reveal right ventricular strain.  COPD with exacerbation: -Managed on presentation. -Currently stable.  Acute kidney injury on chronic kidney disease stage IIIa versus AKI: -Current AKI is likely cardiorenal. -AKI has resolved significantly. -Serum creatinine is back to normal.  Possible hospital-acquired pneumonia: -Patient is currently on IV cefepime.*-Patient will complete course of antibiotics.  Shock liver: -Likely secondary to acute congestive heart failure with right heart failure involvement. -AST and ALT have improved significantly.  Large right-sided pleural effusion: -Status postthoracentesis.  Thrombocytopenia: -Improving. -Platelet count has gone up to 125,000. -Continue to monitor closely.  History of esophageal cancer and throat cancer: -Status postchemotherapy and radiotherapy treatment.  Hyponatremia: -Improving. -May have prognostic implications.  DVT prophylaxis: Eliquis Code Status: Partial Family Communication: Daughter Disposition Plan: This will depend on hospital course   Consultants:  Cardiology  Procedures:  Echocardiogram.   Antimicrobials:  IV cefepime   Subjective: No shortness of breath. No chest pain. No fever or chills. No productive sputum.  Objective: Vitals:   06/22/21 0800 06/22/21 1118 06/22/21 1213 06/22/21 1530  BP: (!) 110/59 (!) 99/50 (!) 151/73 132/76  Pulse: (!) 109 (!) 106 (!) 109 98  Resp: 17 20 20 19   Temp:  97.8 F (36.6 C) 97.8 F (36.6 C) 97.9 F (36.6 C)  TempSrc:  Oral Oral Oral  SpO2: 98% 96% 96% 97%  Weight:      Height:        Intake/Output Summary (Last 24 hours) at 06/22/2021 1551 Last data filed at 06/22/2021 0800 Gross per 24 hour  Intake 1560 ml  Output 4100 ml  Net -2540 ml   Filed Weights   06/19/21 0500 06/20/21 0500 06/22/21 8676  Weight: 58.2 kg 55 kg 54.4 kg    Examination:  General exam: Appears  calm and comfortable.  Patient is stable. Respiratory system: Clear to auscultation.  Cardiovascular system: S1 & S2 heard. Gastrointestinal system: Abdomen is nondistended, soft and nontender.  Central nervous system: Alert and oriented.  Patient moves all extremities.   Extremities: No leg edema.  Data Reviewed: I have personally reviewed following labs and imaging studies  CBC: Recent Labs  Lab 06/18/21 0352 06/19/21 0331 06/20/21 0328 06/21/21 0207 06/22/21 0443  WBC 6.4 7.5 7.2 7.5 7.8  HGB 10.6* 10.8* 11.5* 11.8* 12.4*  HCT 33.9* 34.5* 36.0* 37.1* 40.9  MCV 85.4 85.8 84.7 84.5 86.3  PLT 82* 90* 91* 105* 355*   Basic Metabolic Panel: Recent Labs  Lab 06/16/21 0406 06/16/21 1151 06/17/21 0443 06/18/21 0352 06/19/21 0331 06/20/21 0328 06/21/21 0207 06/21/21 1630 06/22/21 0443  NA 131*   < > 133* 131* 131* 127* 125* 128* 132*  K 4.7   < > 3.6 3.6 4.3 4.3 4.2  --  4.6  CL 92*  --  93* 94* 95* 93* 92*  --  99  CO2 26  --  30 29 29 27 27   --  27  GLUCOSE 102*  --  138* 94 85 107* 123*  --  138*  BUN 80*  --  69* 48* 29* 16 18  --  18  CREATININE 1.98*  --  1.60* 1.17 0.96 0.82 0.91  --  0.90  CALCIUM 9.3  --  8.6* 8.2* 8.1* 7.6* 7.6*  --  8.7*  MG 3.0*  --  2.8*  --  2.1  --  2.0  --  2.1   < > = values in this interval not displayed.   GFR: Estimated Creatinine Clearance: 58.8 mL/min (by C-G formula based on SCr of 0.9 mg/dL). Liver Function Tests: Recent Labs  Lab 06/16/21 0406 06/17/21 0443 06/18/21 0352 06/19/21 0331 06/20/21 0328  AST 4,265* 2,280* 1,340* 790* 454*  ALT 3,809* 3,159* 2,315* 1,812* 1,316*  ALKPHOS 54 48 54 55 53  BILITOT 1.5* 1.5* 1.7* 1.4* 1.3*  PROT 6.8 5.7* 5.7* 5.4* 5.4*  ALBUMIN 3.8 2.9* 2.8* 2.7* 2.6*   No results for input(s): LIPASE, AMYLASE in the last 168 hours. No results for input(s): AMMONIA in the last 168 hours. Coagulation Profile: Recent Labs  Lab 06/16/21 1309 06/17/21 0446 06/19/21 0331 06/20/21 0328  INR  5.8* 3.9* 1.5* 1.2   Cardiac Enzymes: No results for input(s): CKTOTAL, CKMB, CKMBINDEX, TROPONINI in the last 168 hours. BNP (last 3 results) Recent Labs    05/14/21 0935  PROBNP 4,378.0*   HbA1C: No results for input(s): HGBA1C in the last 72 hours. CBG: Recent Labs  Lab 06/17/21 1059 06/17/21 1610 06/17/21 1926  GLUCAP 158* 131* 157*   Lipid Profile: No results for input(s): CHOL, HDL, LDLCALC, TRIG, CHOLHDL, LDLDIRECT in the last 72 hours. Thyroid Function Tests: No results for input(s): TSH, T4TOTAL, FREET4, T3FREE, THYROIDAB in the last 72 hours. Anemia Panel: No results for input(s): VITAMINB12, FOLATE, FERRITIN, TIBC, IRON, RETICCTPCT in the last 72 hours. Urine analysis:    Component Value Date/Time   COLORURINE YELLOW 05/07/2021 2345   APPEARANCEUR HAZY (A) 05/07/2021 2345   APPEARANCEUR Clear 05/29/2020 1556   LABSPEC 1.023 05/07/2021 2345   PHURINE 5.0 05/07/2021 2345   GLUCOSEU NEGATIVE 05/07/2021 2345   HGBUR NEGATIVE 05/07/2021 [place] 05/07/2021 2345   BILIRUBINUR Negative 05/29/2020 1556   KETONESUR  NEGATIVE 05/07/2021 2345   PROTEINUR 100 (A) 05/07/2021 2345   UROBILINOGEN 1.0 08/04/2014 2007   NITRITE NEGATIVE 05/07/2021 2345   LEUKOCYTESUR TRACE (A) 05/07/2021 2345   Sepsis Labs: @LABRCNTIP (procalcitonin:4,lacticidven:4)  ) Recent Results (from the past 240 hour(s))  Culture, group A strep     Status: None   Collection Time: 06/15/21  5:04 PM   Specimen: Throat  Result Value Ref Range Status   Specimen Description   Final    THROAT Performed at [facility] [address] [place], [place] [address]    Special Requests   Final    NONE Performed at [facility], [place] [address], [place], [place] [address]    Culture   Final    NO GROUP A STREP (S.PYOGENES) ISOLATED Performed at [facility], [place] [address], [place], [place] [address]    Report Status 06/18/2021 FINAL   Final  MRSA Next Gen by PCR, Nasal     Status: None   Collection Time: 06/16/21  4:24 PM   Specimen: Nasal Mucosa; Nasal Swab  Result Value Ref Range Status   MRSA by PCR Next Gen NOT DETECTED NOT DETECTED Final    Comment: (NOTE) The GeneXpert MRSA Assay (FDA approved for NASAL specimens only), is one component of a comprehensive MRSA colonization surveillance program. It is not intended to diagnose MRSA infection nor to guide or monitor treatment for MRSA infections. Test performance is not FDA approved in patients less than 36 years old. Performed at [facility], [place] [address], [place], [place] [address]          Radiology Studies: No results found.      Scheduled Meds:  apixaban  5 mg Oral BID   Chlorhexidine Gluconate Cloth  6 each Topical Daily   digoxin  0.125 mg Oral Daily   feeding supplement  237 mL Oral TID BM   guaiFENesin  600 mg Oral BID   hydrALAZINE  37.5 mg Oral Q8H   isosorbide mononitrate  30 mg Oral Daily   magic mouthwash w/lidocaine  5 mL Oral QID   multivitamin  15 mL Oral Daily   polyethylene glycol  17 g Oral BID   pravastatin  80 mg Oral q1800   sacubitril-valsartan  1 tablet Oral BID   senna-docusate  2 tablet Oral BID   sodium chloride flush  10-40 mL Intracatheter Q12H   spironolactone  12.5 mg Oral Daily   thiamine  100 mg Oral Daily   Continuous Infusions:  sodium chloride Stopped (06/20/21 0330)   ceFEPime (MAXIPIME) IV 2 g (06/22/21 1352)     LOS: 11 days    Time spent: 39 minutes.    Dana Allan, MD  [facility] Pager #: [phone] 7PM-7AM contact night coverage as above

## 2021-06-22 NOTE — Progress Notes (Signed)
Patient ID: [name], male   DOB: 11-14-1950, 70 y.o.   MRN: [id number]    Progress Note from the Palliative Medicine Team at [facility]   Patient Name: Collin Young        Date: 06/22/2021 DOB: Dec 01, 1950  Age: 70 y.o. MRN#: [id number] Attending Physician: Bonnell Public, MD Primary Care Physician: Darreld Mclean, MD Admit Date: 06/11/2021   Medical records reviewed   Acute on Chronic Systolic Heart Failure -> Cardiogenic Shock  - Echo 2018 EF 10-15%, RV moderately reduced. LHC w/ MVCAD management medically - Echo 05/2017, EF improved up to 45-50%, Mod AI. RV normal.   - Echo 05/2020 EF down again, 35-40%. RV normal  - Echo 05/2021 EF down 25-30%, RV not well visualized. Mod MR and Mod AI. - Limited Echo 10/22: EF 20%, RV normal  - Admitted w/ a/c CHF w/ cardiogenic shock w/ AKI + Shock liver - RHC 06/16/21 w/ R>L HF and low output, CI 1.8. PAPi 1.3  - Now on milrinone 0.5. CI 2.3 today. LFTs improving, AKI resolved   - CVP 14 today, 80 IV Lasix x 1 w/ K Supp  - Overall prognosis poor. Not candidate for advanced therapies  This NP visited patient at the bedside as a follow up to [place].  [name] is awake and alert and pleasantly engages in conversation.  He offers no complaints and tells me he feels comfortable today  Met as scheduled at bedside with daughter and a family friend for continued conversation regarding current medical situation.  All understand the seriousness of his heart disease and igh risk for decompensation.  Patient hopes for full medical support in hopes of continued quality of life.    MOST form completed  Education offered to  patient the importance of continued conversation with his family and his physicians regarding his medical situation, treatment option decisions and advance care planning decisions.  Recommend OP Community based palliative services.  Education offered on hospice benefit and eligibility.   Total time spent  on the unit was minutes 40 minutes  Greater than 50% of the time was spent in counseling and coordination of care  Wadie Lessen NP  Palliative Medicine Team Team Phone # [phone] Pager [phone]

## 2021-06-23 ENCOUNTER — Other Ambulatory Visit (HOSPITAL_COMMUNITY): Payer: Self-pay

## 2021-06-23 DIAGNOSIS — R0602 Shortness of breath: Secondary | ICD-10-CM | POA: Diagnosis not present

## 2021-06-23 DIAGNOSIS — I2601 Septic pulmonary embolism with acute cor pulmonale: Secondary | ICD-10-CM | POA: Diagnosis not present

## 2021-06-23 DIAGNOSIS — I5023 Acute on chronic systolic (congestive) heart failure: Secondary | ICD-10-CM | POA: Diagnosis not present

## 2021-06-23 DIAGNOSIS — R7989 Other specified abnormal findings of blood chemistry: Secondary | ICD-10-CM | POA: Diagnosis not present

## 2021-06-23 LAB — CBC WITH DIFFERENTIAL/PLATELET
Abs Immature Granulocytes: 0.07 10*3/uL (ref 0.00–0.07)
Basophils Absolute: 0 10*3/uL (ref 0.0–0.1)
Basophils Relative: 1 %
Eosinophils Absolute: 0.2 10*3/uL (ref 0.0–0.5)
Eosinophils Relative: 3 %
HCT: 39 % (ref 39.0–52.0)
Hemoglobin: 12.2 g/dL — ABNORMAL LOW (ref 13.0–17.0)
Immature Granulocytes: 1 %
Lymphocytes Relative: 16 %
Lymphs Abs: 1.2 10*3/uL (ref 0.7–4.0)
MCH: 27.1 pg (ref 26.0–34.0)
MCHC: 31.3 g/dL (ref 30.0–36.0)
MCV: 86.7 fL (ref 80.0–100.0)
Monocytes Absolute: 1.3 10*3/uL — ABNORMAL HIGH (ref 0.1–1.0)
Monocytes Relative: 17 %
Neutro Abs: 5 10*3/uL (ref 1.7–7.7)
Neutrophils Relative %: 62 %
Platelets: 142 10*3/uL — ABNORMAL LOW (ref 150–400)
RBC: 4.5 MIL/uL (ref 4.22–5.81)
RDW: 15.5 % (ref 11.5–15.5)
WBC: 7.8 10*3/uL (ref 4.0–10.5)
nRBC: 0 % (ref 0.0–0.2)

## 2021-06-23 LAB — RENAL FUNCTION PANEL
Albumin: 2.7 g/dL — ABNORMAL LOW (ref 3.5–5.0)
Anion gap: 7 (ref 5–15)
BUN: 18 mg/dL (ref 8–23)
CO2: 27 mmol/L (ref 22–32)
Calcium: 8.7 mg/dL — ABNORMAL LOW (ref 8.9–10.3)
Chloride: 96 mmol/L — ABNORMAL LOW (ref 98–111)
Creatinine, Ser: 0.88 mg/dL (ref 0.61–1.24)
GFR, Estimated: >60 mL/min (ref >60)
Glucose, Bld: 93 mg/dL (ref 70–99)
Phosphorus: 2.3 mg/dL — ABNORMAL LOW (ref 2.5–4.6)
Potassium: 4.5 mmol/L (ref 3.5–5.1)
Sodium: 130 mmol/L — ABNORMAL LOW (ref 135–145)

## 2021-06-23 LAB — COOXEMETRY PANEL
Carboxyhemoglobin: 1.6 % — ABNORMAL HIGH (ref 0.5–1.5)
Methemoglobin: 0.7 % (ref 0.0–1.5)
O2 Saturation: 73.9 %
Total hemoglobin: 12.5 g/dL (ref 12.0–16.0)

## 2021-06-23 LAB — MAGNESIUM: Magnesium: 1.8 mg/dL (ref 1.7–2.4)

## 2021-06-23 MED ORDER — IVABRADINE HCL 5 MG PO TABS
2.5000 mg | ORAL_TABLET | Freq: Two times a day (BID) | ORAL | Status: DC
  Administered 2021-06-23 – 2021-06-24 (×3): 2.5 mg via ORAL
  Filled 2021-06-23 (×4): qty 1

## 2021-06-23 MED ORDER — IVABRADINE HCL 5 MG PO TABS: 2.5000 mg | ORAL_TABLET | Freq: Two times a day (BID) | ORAL | Status: DC

## 2021-06-23 MED ORDER — MAGNESIUM SULFATE 2 GM/50ML IV SOLN
2.0000 g | Freq: Once | INTRAVENOUS | Status: AC
  Administered 2021-06-23: 2 g via INTRAVENOUS
  Filled 2021-06-23: qty 50

## 2021-06-23 NOTE — TOC CM/SW Note (Signed)
HF TOC CM received confirmation from [place], [facility]. They will accept referral for [facility]. Will need HH orders for [facility] with F2F. [place], Heart Failure TOC CM [phone]

## 2021-06-23 NOTE — Evaluation (Signed)
Physical Therapy Evaluation Patient Details Name: Collin Young MRN: [id number] DOB: [birth year] Today's Date: 06/23/2021  History of Present Illness  Patient is [name] with past medical history significant for systolic and diastolic CHF, COPD, prior smoker, CAD, esophageal and prostate cancer s/p chemo and radiation, GERD, prior ETOH abuse, seizures, HTN, and HLD.  Admitted with SOB due to CHF exacerbation and R LL PE.  S/p thoracentesis on 10/12 of 1200 cc.  Clinical Impression  Patient presents with mobility close to functional baseline.  He lives alone in ground floor apartment and does his own IADL's including driving, grocery shopping.  Daughters live close and can help PRN.  Currently demonstrating independent mobility with hallway ambulation, sit to stand and general balance testing.  Patient stable for home without PT follow up, but with PRN family support.  No current PT needs.  Discussed with pt/nursing assist for hallway ambulation daily due to IV/heart monitor limiting independence.  PT signing off.        Recommendations for follow up therapy are one component of a multi-disciplinary discharge planning process, led by the attending physician.  Recommendations may be updated based on patient status, additional functional criteria and insurance authorization.  Follow Up Recommendations No PT follow up    Assistance Recommended at Discharge PRN  Functional Status Assessment Patient has not had a recent decline in their functional status  Equipment Recommendations  None recommended by PT    Recommendations for Other Services       Precautions / Restrictions        Mobility  Bed Mobility               General bed mobility comments: up in recliner    Transfers Overall transfer level: Modified independent                      Ambulation/Gait Ambulation/Gait assistance: Modified independent (Device/Increase time) Gait  Distance (Feet): 200 Feet Assistive device: None Gait Pattern/deviations: Step-through pattern;Wide base of support;Knee flexed in stance - right     General Gait Details: some R ankle weakness obvious; no LOB  Stairs            Wheelchair Mobility    Modified Rankin (Stroke Patients Only)       Balance Overall balance assessment: Modified Independent;No apparent balance deficits (not formally assessed)           Standing balance-Leahy Scale: Normal Standing balance comment: stands eyes closed 10 s, SLS at least 3 seconds, turn around and stop no drift or incresaed time, able to look to sides and up and down with ambulation no drift                             Pertinent Vitals/Pain Pain Assessment: No/denies pain    Home Living Family/patient expects to be discharged to:: Private residence Living Arrangements: Alone Available Help at Discharge: Family;Available PRN/intermittently Type of Home: Apartment Home Access: Level entry       Home Layout: One level Home Equipment: None;Grab bars - tub/shower Additional Comments: daughters live close and can help; no falls in 6 months    Prior Function Prior Level of Function : Independent/Modified Independent               ADLs Comments: drives, grocery shops, cooks, cleans     Hand Dominance        Extremity/Trunk Assessment  Upper Extremity Assessment Upper Extremity Assessment: Overall WFL for tasks assessed    Lower Extremity Assessment Lower Extremity Assessment: Overall WFL for tasks assessed       Communication   Communication: HOH (has hearing aides)  Cognition Arousal/Alertness: Awake/alert Behavior During Therapy: WFL for tasks assessed/performed Overall Cognitive Status: Within Functional Limits for tasks assessed                                          General Comments General comments (skin integrity, edema, etc.): SpO2 93% or greater on RA with  ambulation    Exercises     Assessment/Plan    PT Assessment Patient does not need any further PT services  PT Problem List         PT Treatment Interventions      PT Goals (Current goals can be found in the Care Plan section)  Acute Rehab PT Goals Patient Stated Goal: to go home PT Goal Formulation: All assessment and education complete, DC therapy    Frequency     Barriers to discharge        Co-evaluation               AM-PAC PT "6 Clicks" Mobility  Outcome Measure Help needed turning from your back to your side while in a flat bed without using bedrails?: None Help needed moving from lying on your back to sitting on the side of a flat bed without using bedrails?: None Help needed moving to and from a bed to a chair (including a wheelchair)?: None Help needed standing up from a chair using your arms (e.g., wheelchair or bedside chair)?: None Help needed to walk in hospital room?: None Help needed climbing 3-5 steps with a railing? : None 6 Click Score: 24    End of Session   Activity Tolerance: Patient tolerated treatment well Patient left: in chair;with call bell/phone within reach Nurse Communication: Mobility status;Other (comment) (no current PT needs) PT Visit Diagnosis: Muscle weakness (generalized) (M62.81)    Time: 9833-8250 PT Time Calculation (min) (ACUTE ONLY): 24 min   Charges:   PT Evaluation $PT Eval Low Complexity: 1 Low PT Treatments $Gait Training: 8-22 mins        Magda Kiel, PT Acute Rehabilitation Services Pager:[phone] Office:[phone] 06/23/2021   Reginia Naas 06/23/2021, 10:44 AM

## 2021-06-23 NOTE — Progress Notes (Signed)
PROGRESS NOTE    Collin Young  QBH:[id number] DOB: [birth year] DOA: 06/11/2021 PCP: Darreld Mclean, MD  Outpatient Specialists:   Brief Narrative:  Patient is [name] with past medical history significant for systolic and diastolic CHF, COPD, prior smoker, CAD, esophageal and prostate cancer s/p chemo and radiation, GERD, prior ETOH abuse, seizures, HTN, and HLD.  Patient presented to emergency room with shortness of breath.  Patient has been managed for COPD with exacerbation, acute on chronic systolic heart failure with EF of 20%, large right-sided pleural effusion, acute kidney injury versus acute on chronic kidney injury with acute component thought to be cardiorenal and lobar to segmental pulmonary embolism in the right lower lobe.  Patient was transferred back to the hospitalist team yesterday.  Heart failure team is assisting with patient's congestive heart failure management.  Patient is currently on Entresto, Imdur, hydralazine, digoxin and Aldactone.  Patient is also on Eliquis for pulmonary embolism.  Patient will complete course of cefepime for possible hospital associated pneumonia.  CT chest, echocardiogram and other work-up reviewed.  Patient has also undergone thoracentesis during this admission.  Patient was seen today alongside patient's daughter.  No new complaints.   Assessment & Plan:   Active Problems:   Acute on chronic systolic congestive heart failure ([place])   Palliative care by specialist   Pulmonary embolism ([place])   Elevated LFTs   DNR (do not resuscitate)   Weakness generalized   SOB (shortness of breath)  Acute on chronic systolic heart failure: -Heart failure team is directing care. -Milrinone has been discontinued.  Cor-ox stable.  Off milrinone. -Patient is currently on Imdur, hydralazine, Entresto, Aldactone, ivabradine, digoxin. -Wilder Glade will be considered.   -Acute kidney injury has improved with optimization of the heart  failure treatment.  Acute pulmonary embolism: -Patient is on Eliquis. -Echocardiogram did not reveal right ventricular strain.  COPD with exacerbation: Treated and now stable.  Acute kidney injury on chronic kidney disease stage IIIa versus AKI: -Current AKI is likely cardiorenal. -AKI has resolved significantly. -Serum creatinine in fact better than his baseline.  Possible hospital-acquired pneumonia: -Patient completed IV cefepime course.  Shock liver: -Likely secondary to acute congestive heart failure with right heart failure involvement. -AST and ALT have improved significantly.  Large right-sided pleural effusion: -Status postthoracentesis.  Thrombocytopenia: -Improving.  Monitor.  History of esophageal cancer and throat cancer: -Status postchemotherapy and radiotherapy treatment.  Hyponatremia: Improved with tolvaptan.  Restrict free water.  DVT prophylaxis: Eliquis Code Status: Partial Family Communication: None Disposition Plan: This will depend on hospital course.  When cleared by heart failure team.   Consultants:  Cardiology  Procedures:  Echocardiogram.   Antimicrobials:  IV cefepime-completed   Subjective: Patient seen and examined.  He has no complaints.  Objective: Vitals:   06/23/21 0321 06/23/21 0606 06/23/21 0616 06/23/21 0746  BP: 115/69 122/71  (!) 128/59  Pulse: 98   (!) 108  Resp: (!) 25   17  Temp: 98.3 F (36.8 C)   98.3 F (36.8 C)  TempSrc: Oral   Oral  SpO2: 98%   98%  Weight:   55 kg   Height:        Intake/Output Summary (Last 24 hours) at 06/23/2021 1023 Last data filed at 06/23/2021 0918 Gross per 24 hour  Intake 850 ml  Output 1100 ml  Net -250 ml    Filed Weights   06/20/21 0500 06/22/21 0632 06/23/21 0616  Weight: 55 kg 54.4 kg 55  kg    Examination:  General exam: Appears calm and comfortable  Respiratory system: Clear to auscultation. Respiratory effort normal. Cardiovascular system: S1 & S2  heard, RRR. No JVD, murmurs, rubs, gallops or clicks. No pedal edema. Gastrointestinal system: Abdomen is nondistended, soft and nontender. No organomegaly or masses felt. Normal bowel sounds heard. Central nervous system: Alert and oriented. No focal neurological deficits. Extremities: Symmetric 5 x 5 power. Skin: No rashes, lesions or ulcers.  Psychiatry: Judgement and insight appear normal. Mood & affect appropriate.    Data Reviewed: I have personally reviewed following labs and imaging studies  CBC: Recent Labs  Lab 06/19/21 0331 06/20/21 0328 06/21/21 0207 06/22/21 0443 06/23/21 0649  WBC 7.5 7.2 7.5 7.8 7.8  NEUTROABS  --   --   --   --  5.0  HGB 10.8* 11.5* 11.8* 12.4* 12.2*  HCT 34.5* 36.0* 37.1* 40.9 39.0  MCV 85.8 84.7 84.5 86.3 86.7  PLT 90* 91* 105* 125* 142*    Basic Metabolic Panel: Recent Labs  Lab 06/17/21 0443 06/18/21 0352 06/19/21 0331 06/20/21 0328 06/21/21 0207 06/21/21 1630 06/22/21 0443 06/23/21 0648  NA 133*   < > 131* 127* 125* 128* 132* 130*  K 3.6   < > 4.3 4.3 4.2  --  4.6 4.5  CL 93*   < > 95* 93* 92*  --  99 96*  CO2 30   < > 29 27 27   --  27 27  GLUCOSE 138*   < > 85 107* 123*  --  138* 93  BUN 69*   < > 29* 16 18  --  18 18  CREATININE 1.60*   < > 0.96 0.82 0.91  --  0.90 0.88  CALCIUM 8.6*   < > 8.1* 7.6* 7.6*  --  8.7* 8.7*  MG 2.8*  --  2.1  --  2.0  --  2.1 1.8  PHOS  --   --   --   --   --   --   --  2.3*   < > = values in this interval not displayed.    GFR: Estimated Creatinine Clearance: 60.8 mL/min (by C-G formula based on SCr of 0.88 mg/dL). Liver Function Tests: Recent Labs  Lab 06/17/21 0443 06/18/21 0352 06/19/21 0331 06/20/21 0328 06/23/21 0648  AST 2,280* 1,340* 790* 454*  --   ALT 3,159* 2,315* 1,812* 1,316*  --   ALKPHOS 48 54 55 53  --   BILITOT 1.5* 1.7* 1.4* 1.3*  --   PROT 5.7* 5.7* 5.4* 5.4*  --   ALBUMIN 2.9* 2.8* 2.7* 2.6* 2.7*    No results for input(s): LIPASE, AMYLASE in the last 168  hours. No results for input(s): AMMONIA in the last 168 hours. Coagulation Profile: Recent Labs  Lab 06/16/21 1309 06/17/21 0446 06/19/21 0331 06/20/21 0328  INR 5.8* 3.9* 1.5* 1.2    Cardiac Enzymes: No results for input(s): CKTOTAL, CKMB, CKMBINDEX, TROPONINI in the last 168 hours. BNP (last 3 results) Recent Labs    05/14/21 0935  PROBNP 4,378.0*    HbA1C: No results for input(s): HGBA1C in the last 72 hours. CBG: Recent Labs  Lab 06/17/21 1059 06/17/21 1610 06/17/21 1926  GLUCAP 158* 131* 157*    Lipid Profile: No results for input(s): CHOL, HDL, LDLCALC, TRIG, CHOLHDL, LDLDIRECT in the last 72 hours. Thyroid Function Tests: No results for input(s): TSH, T4TOTAL, FREET4, T3FREE, THYROIDAB in the last 72 hours. Anemia Panel: No results for  input(s): VITAMINB12, FOLATE, FERRITIN, TIBC, IRON, RETICCTPCT in the last 72 hours. Urine analysis:    Component Value Date/Time   COLORURINE YELLOW 05/07/2021 2345   APPEARANCEUR HAZY (A) 05/07/2021 2345   APPEARANCEUR Clear 05/29/2020 1556   LABSPEC 1.023 05/07/2021 2345   PHURINE 5.0 05/07/2021 2345   GLUCOSEU NEGATIVE 05/07/2021 2345   HGBUR NEGATIVE 05/07/2021 2345   BILIRUBINUR NEGATIVE 05/07/2021 2345   BILIRUBINUR Negative 05/29/2020 [place] 05/07/2021 2345   PROTEINUR 100 (A) 05/07/2021 2345   UROBILINOGEN 1.0 08/04/2014 2007   NITRITE NEGATIVE 05/07/2021 2345   LEUKOCYTESUR TRACE (A) 05/07/2021 2345   Sepsis Labs: @LABRCNTIP (procalcitonin:4,lacticidven:4)  ) Recent Results (from the past 240 hour(s))  Culture, group A strep     Status: None   Collection Time: 06/15/21  5:04 PM   Specimen: Throat  Result Value Ref Range Status   Specimen Description   Final    THROAT Performed at [facility] [address], [place], [place] [address]    Special Requests   Final    NONE Performed at [facility], [place] [address], [place], [place] [address]     Culture   Final    NO GROUP A STREP (S.PYOGENES) ISOLATED Performed at [facility], [place] [address], [place], [place] [address]    Report Status 06/18/2021 FINAL  Final  MRSA Next Gen by PCR, Nasal     Status: None   Collection Time: 06/16/21  4:24 PM   Specimen: Nasal Mucosa; Nasal Swab  Result Value Ref Range Status   MRSA by PCR Next Gen NOT DETECTED NOT DETECTED Final    Comment: (NOTE) The GeneXpert MRSA Assay (FDA approved for NASAL specimens only), is one component of a comprehensive MRSA colonization surveillance program. It is not intended to diagnose MRSA infection nor to guide or monitor treatment for MRSA infections. Test performance is not FDA approved in patients less than 18 years old. Performed at [facility], [place] [address], [place], [place] [address]          Radiology Studies: No results found.      Scheduled Meds:  apixaban  5 mg Oral BID   Chlorhexidine Gluconate Cloth  6 each Topical Daily   digoxin  0.125 mg Oral Daily   feeding supplement  237 mL Oral TID BM   guaiFENesin  600 mg Oral BID   hydrALAZINE  37.5 mg Oral Q8H   isosorbide mononitrate  30 mg Oral Daily   ivabradine  2.5 mg Oral BID WC   magic mouthwash w/lidocaine  5 mL Oral QID   multivitamin  15 mL Oral Daily   polyethylene glycol  17 g Oral BID   pravastatin  80 mg Oral q1800   sacubitril-valsartan  1 tablet Oral BID   senna-docusate  2 tablet Oral BID   sodium chloride flush  10-40 mL Intracatheter Q12H   spironolactone  12.5 mg Oral Daily   thiamine  100 mg Oral Daily   Continuous Infusions:  sodium chloride Stopped (06/20/21 0330)     LOS: 12 days    Time spent: 35 minutes.  Darliss Cheney, MD  [facility] 7PM-7AM contact night coverage as above

## 2021-06-23 NOTE — Progress Notes (Addendum)
Advanced Heart Failure Rounding Note   Subjective:    Co-ox stable off milrinone, 74%.   Diuretics held over weekend for low volume status. Wt up 2 lb.  Sinus tach, low 100s-110.   Na 130.   Mg 1.8   OOB, sitting up in chair. Eating breakfast. Feels good. No dyspnea.   Objective:   Weight Range:  Vital Signs:   Temp:  [97.8 F (36.6 C)-98.7 F (37.1 C)] 98.3 F (36.8 C) (10/24 0321) Pulse Rate:  [98-112] 98 (10/24 0321) Resp:  [16-25] 25 (10/24 0321) BP: (99-151)/(50-80) 122/71 (10/24 0606) SpO2:  [96 %-99 %] 98 % (10/24 0321) Weight:  [55 kg] 55 kg (10/24 0616) Last BM Date: 06/23/21  Weight change: Filed Weights   06/20/21 0500 06/22/21 0632 06/23/21 0616  Weight: 55 kg 54.4 kg 55 kg    Intake/Output:   Intake/Output Summary (Last 24 hours) at 06/23/2021 0716 Last data filed at 06/22/2021 2326 Gross per 24 hour  Intake 700 ml  Output 500 ml  Net 200 ml     PHYSICAL EXAM: General:  Well appearing, sitting up in chair. No respiratory difficulty HEENT: normal Neck: supple. no JVD. Carotids 2+ bilat; no bruits. No lymphadenopathy or thyromegaly appreciated. Cor: PMI nondisplaced. Regular rhythm tachy rate. No rubs, gallops or murmurs. Lungs: clear Abdomen: soft, nontender, nondistended. No hepatosplenomegaly. No bruits or masses. Good bowel sounds. Extremities: no cyanosis, clubbing, rash, edema Neuro: alert & oriented x 3, cranial nerves grossly intact. moves all 4 extremities w/o difficulty. Affect pleasant.   Telemetry: Sinus tach 100-110s Personally reviewed   Labs: Basic Metabolic Panel: Recent Labs  Lab 06/17/21 0443 06/18/21 0352 06/19/21 0331 06/20/21 0328 06/21/21 0207 06/21/21 1630 06/22/21 0443  NA 133* 131* 131* 127* 125* 128* 132*  K 3.6 3.6 4.3 4.3 4.2  --  4.6  CL 93* 94* 95* 93* 92*  --  99  CO2 30 29 29 27 27   --  27  GLUCOSE 138* 94 85 107* 123*  --  138*  BUN 69* 48* 29* 16 18  --  18  CREATININE 1.60* 1.17 0.96 0.82  0.91  --  0.90  CALCIUM 8.6* 8.2* 8.1* 7.6* 7.6*  --  8.7*  MG 2.8*  --  2.1  --  2.0  --  2.1    Liver Function Tests: Recent Labs  Lab 06/17/21 0443 06/18/21 0352 06/19/21 0331 06/20/21 0328  AST 2,280* 1,340* 790* 454*  ALT 3,159* 2,315* 1,812* 1,316*  ALKPHOS 48 54 55 53  BILITOT 1.5* 1.7* 1.4* 1.3*  PROT 5.7* 5.7* 5.4* 5.4*  ALBUMIN 2.9* 2.8* 2.7* 2.6*   No results for input(s): LIPASE, AMYLASE in the last 168 hours. No results for input(s): AMMONIA in the last 168 hours.  CBC: Recent Labs  Lab 06/19/21 0331 06/20/21 0328 06/21/21 0207 06/22/21 0443 06/23/21 0649  WBC 7.5 7.2 7.5 7.8 7.8  NEUTROABS  --   --   --   --  5.0  HGB 10.8* 11.5* 11.8* 12.4* 12.2*  HCT 34.5* 36.0* 37.1* 40.9 39.0  MCV 85.8 84.7 84.5 86.3 86.7  PLT 90* 91* 105* 125* 142*    Cardiac Enzymes: No results for input(s): CKTOTAL, CKMB, CKMBINDEX, TROPONINI in the last 168 hours.  BNP: BNP (last 3 results) Recent Labs    05/11/21 0523 06/02/21 0311 06/11/21 0225  BNP 1,797.3* 2,342.9* >4,500.0*    ProBNP (last 3 results) Recent Labs    05/14/21 0935  PROBNP 4,378.0*  Other results:  Imaging: No results found.   Medications:     Scheduled Medications:  apixaban  5 mg Oral BID   Chlorhexidine Gluconate Cloth  6 each Topical Daily   digoxin  0.125 mg Oral Daily   feeding supplement  237 mL Oral TID BM   guaiFENesin  600 mg Oral BID   hydrALAZINE  37.5 mg Oral Q8H   isosorbide mononitrate  30 mg Oral Daily   magic mouthwash w/lidocaine  5 mL Oral QID   multivitamin  15 mL Oral Daily   polyethylene glycol  17 g Oral BID   pravastatin  80 mg Oral q1800   sacubitril-valsartan  1 tablet Oral BID   senna-docusate  2 tablet Oral BID   sodium chloride flush  10-40 mL Intracatheter Q12H   spironolactone  12.5 mg Oral Daily   thiamine  100 mg Oral Daily    Infusions:  sodium chloride Stopped (06/20/21 0330)    PRN Medications: sodium chloride, acetaminophen  (TYLENOL) oral liquid 160 mg/5 mL, albuterol, diphenhydrAMINE, lidocaine, menthol-cetylpyridinium, nitroGLYCERIN, ondansetron (ZOFRAN) IV, phenol, sodium chloride flush   Assessment/Plan:   1. Acute on Chronic Systolic Heart Failure -> Cardiogenic Shock  - Echo 2018 EF 10-15%, RV moderately reduced. LHC w/ MVCAD management medically - Echo 05/2020 EF down again, 35-40%. RV normal  - Echo 05/2021 EF down 25-30%, RV not well visualized. Mod MR and Mod AI. - Limited Echo 10/22: EF 20%, RV normal  - Admitted w/ a/c CHF w/ cardiogenic shock w/ AKI + Shock liver - RHC 06/16/21 w/ R>L HF and low output, CI 1.8. PAPi 1.3  - Milrinone stopped 10/21. Co-ox stable 74%.  - Continue hydral 37.5 mg tid  - Continue Imdur 30  - Continue Spiro 12.5 daily  - Continue Entresto 24/26 bid - Hold diuretics again today  - Consider restarting Farxiga soon - Start ivabradine 2.5 mg bid  - Not felt to be VAD candidate due to cachexia and RV failure. Can reconsider as he improves   2. Acute Pulmonary Embolism  - CT w/ rt lower lobe PE  - Limited echo w/ no RV strain  - Suspect incidental due HF - On Eliquis   3. Shock Liver - due to shock. Improving with inotropes   4. AKI on Stage IIIa - Cardiorenal  - Baseline SCr 1.3 - Bumped to 2.11-> resolved with support. SCr 0.88 today    5. Large Rt Pleural Effusion  - s/p thoracentesis by CCM 10/12 - 1200 cc removed, transudative    6. H/o esophageal CA and throat CA - s/p chemo and XRT  7. Thrombocytopenia - PLTs trending up, 83>>68>>-> 105k -> 125->142k - no bleeding  8. Possible HAP - completed cefepime  9. Hyponatremia - improved w/ Tolvaptan 125>>128>>132>>130  - restrict free water  10. Hypomagnesemia - Mg 1.8, supp w/ MgSO4    10. Deconditioning  - OOB and ambulate - PT/OT consult   Length of Stay: [place] PA-C 06/23/2021, 7:16 AM  Advanced Heart Failure Team Pager [phone] (M-F; 7a - 4p)  Please contact [facility] for night-coverage after hours (4p -7a ) and weekends on [email or web address]    Patient seen and examined with the above-signed Advanced Practice Provider and/or Housestaff. I personally reviewed laboratory data, imaging studies and relevant notes. I independently examined the patient and formulated the important aspects of the plan. I have edited the note to reflect any of my changes or salient points. I  have personally discussed the plan with the patient and/or family.  Now off milrinone. Co-ox stable at 74% Still tachy. Denies CP, SOB, orthopnea or PND.   General:  Sitting in chair. No resp difficulty HEENT: normal Neck: supple. no JVD. Carotids 2+ bilat; no bruits. No lymphadenopathy or thryomegaly appreciated. Cor: PMI nondisplaced. Regular tachy No rubs, gallops or murmurs. Lungs: clear Abdomen: soft, nontender, nondistended. No hepatosplenomegaly. No bruits or masses. Good bowel sounds. Extremities: no cyanosis, clubbing, rash, edema Neuro: alert & orientedx3, cranial nerves grossly intact. moves all 4 extremities w/o difficulty. Affect pleasant  Co-ox remains stable off milrinone. Off diuretics weight up 2 pounds. Remains tachy. Will start ivabradine (check with PharmD regarding coverage). Likely add [place] tomorrow. Continue to ambulate.   Glori Bickers, MD  9:23 AM

## 2021-06-23 NOTE — Plan of Care (Signed)

## 2021-06-24 ENCOUNTER — Other Ambulatory Visit (HOSPITAL_COMMUNITY): Payer: Self-pay

## 2021-06-24 DIAGNOSIS — I5023 Acute on chronic systolic (congestive) heart failure: Secondary | ICD-10-CM | POA: Diagnosis not present

## 2021-06-24 LAB — COMPREHENSIVE METABOLIC PANEL
ALT: 537 U/L — ABNORMAL HIGH (ref 0–44)
AST: 107 U/L — ABNORMAL HIGH (ref 15–41)
Albumin: 2.7 g/dL — ABNORMAL LOW (ref 3.5–5.0)
Alkaline Phosphatase: 59 U/L (ref 38–126)
Anion gap: 7 (ref 5–15)
BUN: 17 mg/dL (ref 8–23)
CO2: 26 mmol/L (ref 22–32)
Calcium: 8.6 mg/dL — ABNORMAL LOW (ref 8.9–10.3)
Chloride: 94 mmol/L — ABNORMAL LOW (ref 98–111)
Creatinine, Ser: 0.89 mg/dL (ref 0.61–1.24)
GFR, Estimated: >60 mL/min (ref >60)
Glucose, Bld: 104 mg/dL — ABNORMAL HIGH (ref 70–99)
Potassium: 4.5 mmol/L (ref 3.5–5.1)
Sodium: 127 mmol/L — ABNORMAL LOW (ref 135–145)
Total Bilirubin: 0.8 mg/dL (ref 0.3–1.2)
Total Protein: 6.1 g/dL — ABNORMAL LOW (ref 6.5–8.1)

## 2021-06-24 LAB — MAGNESIUM: Magnesium: 2.3 mg/dL (ref 1.7–2.4)

## 2021-06-24 LAB — CBC
HCT: 39.5 % (ref 39.0–52.0)
Hemoglobin: 12.5 g/dL — ABNORMAL LOW (ref 13.0–17.0)
MCH: 26.9 pg (ref 26.0–34.0)
MCHC: 31.6 g/dL (ref 30.0–36.0)
MCV: 84.9 fL (ref 80.0–100.0)
Platelets: 140 10*3/uL — ABNORMAL LOW (ref 150–400)
RBC: 4.65 MIL/uL (ref 4.22–5.81)
RDW: 15.3 % (ref 11.5–15.5)
WBC: 6.9 10*3/uL (ref 4.0–10.5)
nRBC: 0 % (ref 0.0–0.2)

## 2021-06-24 LAB — COOXEMETRY PANEL
Carboxyhemoglobin: 1.4 % (ref 0.5–1.5)
Methemoglobin: 0.7 % (ref 0.0–1.5)
O2 Saturation: 66.3 %
Total hemoglobin: 12.6 g/dL (ref 12.0–16.0)

## 2021-06-24 MED ORDER — APIXABAN 5 MG PO TABS
5.0000 mg | ORAL_TABLET | Freq: Two times a day (BID) | ORAL | 0 refills | Status: DC
  Filled 2021-06-24: qty 60, 30d supply, fill #0

## 2021-06-24 MED ORDER — IVABRADINE HCL 5 MG PO TABS
2.5000 mg | ORAL_TABLET | Freq: Two times a day (BID) | ORAL | 0 refills | Status: AC
  Filled 2021-06-24 (×2): qty 30, 30d supply, fill #0

## 2021-06-24 MED ORDER — HYDRALAZINE HCL 25 MG PO TABS
37.5000 mg | ORAL_TABLET | Freq: Three times a day (TID) | ORAL | 0 refills | Status: DC
  Filled 2021-06-24: qty 135, 30d supply, fill #0

## 2021-06-24 MED ORDER — ISOSORBIDE MONONITRATE ER 30 MG PO TB24
30.0000 mg | ORAL_TABLET | Freq: Every day | ORAL | 0 refills | Status: DC
  Filled 2021-06-24: qty 30, 30d supply, fill #0

## 2021-06-24 MED ORDER — DIGOXIN 125 MCG PO TABS
0.1250 mg | ORAL_TABLET | Freq: Every day | ORAL | 0 refills | Status: DC
  Filled 2021-06-24: qty 30, 30d supply, fill #0

## 2021-06-24 MED ORDER — SPIRONOLACTONE 25 MG PO TABS
12.5000 mg | ORAL_TABLET | Freq: Every day | ORAL | 0 refills | Status: DC
  Filled 2021-06-24: qty 15, 30d supply, fill #0

## 2021-06-24 NOTE — Consult Note (Signed)
   [facility] CM Inpatient Consult   06/24/2021  BESNIK FEBUS [birth year] [id number]  [place] [facility] [ACO] Patient: [employer]  Update:  Met with patient at the bedside and he endorses Ar[facility].  Explained that he may be eligible for the Embedded Chronic Care Management program.  Patient agrees to have referral made.  Plan:  Will refer to Embedded CCM RN.  For questions, please contact:  Natividad Brood, RN BSN [facility] Liaison  [phone] business mobile phone Toll free office [phone]  Fax number: [phone] [email or web address]

## 2021-06-24 NOTE — TOC Benefit Eligibility Note (Signed)
Patient Advocate Encounter  Prior Authorization for Corlanor 5 mg has been approved.    PA# [id number] Effective dates: 06/24/2021 through 08/30/2022      Lyndel Safe, [place] Patient Advocate Specialist [place] Antimicrobial Stewardship Team Direct Number: [phone]  Fax: [phone]

## 2021-06-24 NOTE — Plan of Care (Signed)

## 2021-06-24 NOTE — Progress Notes (Addendum)
Discharge instructions (including medications) discussed with and copy provided to patient/caregiver. Patient provided with verbal discharge instructions. Pt daughter at bedside during d/c. RN answered all questions. VSS at discharge. PICC removed pt completed 30 min bedrest. Patient belongings sent with patient and family. Patient by NT via wheelchair to [employer] entrance to private vehicle.

## 2021-06-24 NOTE — TOC Benefit Eligibility Note (Signed)
Patient Advocate Encounter   Received notification that prior authorization for Corlanor 5 mg is required.   PA submitted on 06/24/2021 Key [id number] Status is pending       Lyndel Safe, CPhT Pharmacy Patient Advocate Specialist [facility] Antimicrobial Stewardship Team Direct Number: [phone]  Fax: [phone]

## 2021-06-24 NOTE — Progress Notes (Signed)
Advanced Heart Failure Rounding Note   Subjective:    Co-ox stable off milrinone, 6%.   Feels great. Wants to go home. No SOB, orthopnea or PND.   HR has improved.   Objective:   Weight Range:  Vital Signs:   Temp:  [97.7 F (36.5 C)-98.7 F (37.1 C)] 97.7 F (36.5 C) (10/25 0405) Pulse Rate:  [95-108] 103 (10/25 0405) Resp:  [14-22] 14 (10/25 0405) BP: (114-134)/(53-78) 114/53 (10/25 0405) SpO2:  [97 %-98 %] 97 % (10/25 0405) Weight:  [55.1 kg] (P) 55.1 kg (10/25 0405) Last BM Date: 06/22/21  Weight change: Filed Weights   06/22/21 0632 06/23/21 0616 06/24/21 0405  Weight: 54.4 kg 55 kg (P) 55.1 kg    Intake/Output:   Intake/Output Summary (Last 24 hours) at 06/24/2021 4540 Last data filed at 06/24/2021 0405 Gross per 24 hour  Intake 1510 ml  Output 2550 ml  Net -1040 ml      PHYSICAL EXAM: General:  Thin Well appearing. No resp difficulty HEENT: normal Neck: supple. no JVD. Carotids 2+ bilat; no bruits. No lymphadenopathy or thryomegaly appreciated. Cor: PMI nondisplaced. Regular rate & rhythm. No rubs, gallops or murmurs. Lungs: clear Abdomen: soft, nontender, nondistended. No hepatosplenomegaly. No bruits or masses. Good bowel sounds. Extremities: no cyanosis, clubbing, rash, edema Neuro: alert & orientedx3, cranial nerves grossly intact. moves all 4 extremities w/o difficulty. Affect pleasant    Telemetry: Sinus 90-100 Personally reviewed    Labs: Basic Metabolic Panel: Recent Labs  Lab 06/19/21 0331 06/20/21 0328 06/21/21 0207 06/21/21 1630 06/22/21 0443 06/23/21 0648 06/24/21 0405  NA 131* 127* 125* 128* 132* 130* 127*  K 4.3 4.3 4.2  --  4.6 4.5 4.5  CL 95* 93* 92*  --  99 96* 94*  CO2 29 27 27   --  27 27 26   GLUCOSE 85 107* 123*  --  138* 93 104*  BUN 29* 16 18  --  18 18 17   CREATININE 0.96 0.82 0.91  --  0.90 0.88 0.89  CALCIUM 8.1* 7.6* 7.6*  --  8.7* 8.7* 8.6*  MG 2.1  --  2.0  --  2.1 1.8 2.3  PHOS  --   --   --   --    --  2.3*  --      Liver Function Tests: Recent Labs  Lab 06/18/21 0352 06/19/21 0331 06/20/21 0328 06/23/21 0648 06/24/21 0405  AST 1,340* 790* 454*  --  107*  ALT 2,315* 1,812* 1,316*  --  537*  ALKPHOS 54 55 53  --  59  BILITOT 1.7* 1.4* 1.3*  --  0.8  PROT 5.7* 5.4* 5.4*  --  6.1*  ALBUMIN 2.8* 2.7* 2.6* 2.7* 2.7*    No results for input(s): LIPASE, AMYLASE in the last 168 hours. No results for input(s): AMMONIA in the last 168 hours.  CBC: Recent Labs  Lab 06/20/21 0328 06/21/21 0207 06/22/21 0443 06/23/21 0649 06/24/21 0405  WBC 7.2 7.5 7.8 7.8 6.9  NEUTROABS  --   --   --  5.0  --   HGB 11.5* 11.8* 12.4* 12.2* 12.5*  HCT 36.0* 37.1* 40.9 39.0 39.5  MCV 84.7 84.5 86.3 86.7 84.9  PLT 91* 105* 125* 142* 140*     Cardiac Enzymes: No results for input(s): CKTOTAL, CKMB, CKMBINDEX, TROPONINI in the last 168 hours.  BNP: BNP (last 3 results) Recent Labs    05/11/21 0523 06/02/21 0311 06/11/21 0225  BNP 1,797.3* 2,342.9* >4,500.0*  ProBNP (last 3 results) Recent Labs    05/14/21 0935  PROBNP 4,378.0*       Other results:  Imaging: No results found.   Medications:     Scheduled Medications:  apixaban  5 mg Oral BID   Chlorhexidine Gluconate Cloth  6 each Topical Daily   digoxin  0.125 mg Oral Daily   feeding supplement  237 mL Oral TID BM   guaiFENesin  600 mg Oral BID   hydrALAZINE  37.5 mg Oral Q8H   isosorbide mononitrate  30 mg Oral Daily   ivabradine  2.5 mg Oral BID WC   magic mouthwash w/lidocaine  5 mL Oral QID   multivitamin  15 mL Oral Daily   polyethylene glycol  17 g Oral BID   pravastatin  80 mg Oral q1800   sacubitril-valsartan  1 tablet Oral BID   senna-docusate  2 tablet Oral BID   sodium chloride flush  10-40 mL Intracatheter Q12H   spironolactone  12.5 mg Oral Daily   thiamine  100 mg Oral Daily    Infusions:  sodium chloride Stopped (06/20/21 0330)    PRN Medications: sodium chloride, acetaminophen  (TYLENOL) oral liquid 160 mg/5 mL, albuterol, diphenhydrAMINE, lidocaine, menthol-cetylpyridinium, nitroGLYCERIN, ondansetron (ZOFRAN) IV, phenol, sodium chloride flush   Assessment/Plan:   1. Acute on Chronic Systolic Heart Failure -> Cardiogenic Shock  - Echo 2018 EF 10-15%, RV moderately reduced. LHC w/ MVCAD management medically - Echo 05/2020 EF down again, 35-40%. RV normal  - Echo 05/2021 EF down 25-30%, RV not well visualized. Mod MR and Mod AI. - Limited Echo 10/22: EF 20%, RV normal  - Admitted w/ a/c CHF w/ cardiogenic shock w/ AKI + Shock liver - [facility] 06/16/21 w/ R>L HF and low output, CI 1.8. PAPi 1.3  - Milrinone stopped 10/21. Co-ox stable 66%.  - Continue hydral 37.5 mg tid  - Continue Imdur 30  - Continue Spiro 12.5 daily  - Continue Entresto 24/26 bid - Restart Farxiga - Continue  ivabradine 2.5 mg bid  - Not felt to be VAD candidate due to cachexia and RV failure. Can reconsider as he improves   2. Acute Pulmonary Embolism  - CT w/ rt lower lobe PE  - Limited echo w/ no RV strain  - Suspect incidental due HF - On Eliquis. No bleeding   3. Shock Liver - due to shock. Improving with inotropes   4. AKI on Stage IIIa - Cardiorenal  - Baseline SCr 1.3 - Bumped to 2.11-> resolved with support. SCr 0.889today    5. Large Rt Pleural Effusion  - s/p thoracentesis by CCM 10/12 - 1200 cc removed, transudative    6. H/o esophageal CA and throat CA - s/p chemo and XRT  7. Thrombocytopenia - PLTs trending up, 83>>68>>-> 105k -> 125->142k -> 140k - no bleeding  8. Possible HAP - completed cefepime  9. Hyponatremia - improved w/ Tolvaptan 125>>128>>132>>130  - restrict free water  10. Hypomagnesemia - Mg 2.3, supp w/ MgSO4    10. Deconditioning  - OOB and ambulate - PT/OT consult  -> no needs  11. Hyponatremia - free water restrict.  - ok to d/c with NA 127. Will recheck next week  Ok for d/c today on current HF meds F/u HF clinic next week with labs     hydral 37.5 mg tid  Imdur 30  Spiro 12.5 daily  Entresto 24/26 bid Farxiga 10 Eliquis 5 bid Pravastatin 80  Length of Stay: 13  Glori Bickers MD 06/24/2021, 6:21 AM  Advanced Heart Failure Team Pager [phone] (M-F; 7a - 4p)  Please contact [facility] for night-coverage after hours (4p -7a ) and weekends on [email or web address]

## 2021-06-24 NOTE — Evaluation (Signed)
Occupational Therapy Evaluation Patient Details Name: Collin Young MRN: [id number] DOB: [birth year] Today's Date: 06/24/2021   History of Present Illness Patient is a 70 y.o. male presenting to ED 10/12 with SOB secondary to CHF exacerbation and R LL PE s/p thoracentesis 10/12. PMHx significant for systolic and diastolic CHF, COPD, Hx of tobacco use and ETOH use, CAD, esophageal and prostate cancer s/p chemo and radiation, GERD, seizures, HTN, and HLD.  Admitted with SOB due to CHF exacerbation and R LL PE.   Clinical Impression   PTA patient was living alone and was grossly I with ADLs/IADLs including grocery shopping, cooking and cleaning without AD. Reports PRN supervision from his daughter that lives nearby. Patient currently functioning near baseline demonstrating observed ADLs including toileting/hygiene/clothing management, LB dressing and grooming standing at sink level with Mod I. Patient also completed functional mobility 278ft+ without AD and I. 2/4 DOE with light activity and mobility. Patient limited by deficits listed below including decreased cardiopulmonary endurance. Recommendation for continuation of mobility with nursing staff for duration of admission. Patient does not require continued acute occupational therapy services with OT to sign off at this time.      Recommendations for follow up therapy are one component of a multi-disciplinary discharge planning process, led by the attending physician.  Recommendations may be updated based on patient status, additional functional criteria and insurance authorization.   Follow Up Recommendations  No OT follow up    Assistance Recommended at Discharge Intermittent Supervision/Assistance  Functional Status Assessment  Patient has not had a recent decline in their functional status  Equipment Recommendations  None recommended by OT    Recommendations for Other Services       Precautions / Restrictions  Precautions Precautions: Fall Precaution Comments: Low fall risk Restrictions Weight Bearing Restrictions: No      Mobility Bed Mobility               General bed mobility comments: Patient seated in recliner upon entry.    Transfers Overall transfer level: Modified independent                        Balance Overall balance assessment: Modified Independent;No apparent balance deficits (not formally assessed)           Standing balance-Leahy Scale: Normal                             ADL either performed or assessed with clinical judgement   ADL Overall ADL's : Modified independent                                             Vision   Vision Assessment?: No apparent visual deficits     Perception     Praxis      Pertinent Vitals/Pain Pain Assessment: No/denies pain     Hand Dominance Right   Extremity/Trunk Assessment Upper Extremity Assessment Upper Extremity Assessment: Overall WFL for tasks assessed   Lower Extremity Assessment Lower Extremity Assessment: Overall WFL for tasks assessed   Cervical / Trunk Assessment Cervical / Trunk Assessment: Kyphotic   Communication Communication Communication: HOH (bilateral hearing aides)   Cognition Arousal/Alertness: Awake/alert Behavior During Therapy: WFL for tasks assessed/performed Overall Cognitive Status: Within Functional Limits for tasks assessed  General Comments  HR 110's at rest and with mild activity. SpO2 >97% on RA throughout. 2/4 DOE.    Exercises     Shoulder Instructions      Home Living Family/patient expects to be discharged to:: Private residence Living Arrangements: Alone Available Help at Discharge: Family;Available PRN/intermittently Type of Home: Apartment Home Access: Level entry     Home Layout: One level     Bathroom Shower/Tub: Teacher, early years/pre:  Standard     Home Equipment: None;Grab bars - tub/shower   Additional Comments: daughters live close and can help; no falls in 6 months      Prior Functioning/Environment Prior Level of Function : Independent/Modified Independent             Mobility Comments: I with household/community mobility ADLs Comments: drives, grocery shops, cooks, 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        OT Problem List: Cardiopulmonary status limiting activity      OT Treatment/Interventions:      OT Goals(Current goals can be found in the care plan section) Acute Rehab OT Goals Patient Stated Goal: To return home. OT Goal Formulation: With patient  OT Frequency:     Barriers to D/C:            Co-evaluation              AM-PAC OT "6 Clicks" Daily Activity     Outcome Measure Help from another person eating meals?: None Help from another person taking care of personal grooming?: None Help from another person toileting, which includes using toliet, bedpan, or urinal?: None Help from another person bathing (including washing, rinsing, drying)?: A Little Help from another person to put on and taking off regular upper body clothing?: None Help from another person to put on and taking off regular lower body clothing?: None 6 Click Score: 23   End of Session Equipment Utilized During Treatment: Gait belt Nurse Communication: Mobility status  Activity Tolerance: Patient tolerated treatment well Patient left: in chair;with call bell/phone within reach  OT Visit Diagnosis: Other abnormalities of gait and mobility (R26.89)                Time: 9798-9211 OT Time Calculation (min): 23 min Charges:  OT General Charges $OT Visit: 1 Visit OT Evaluation $OT Eval Low Complexity: 1 Low OT Treatments $Self Care/Home Management : 8-22 mins  Glessie Eustice H. OTR/L Supplemental OT, Department of rehab services [phone]  Jacquetta Polhamus R H. 06/24/2021, 8:38 AM

## 2021-06-24 NOTE — Progress Notes (Signed)
Mobility Specialist Progress Note:   06/24/21 1010  Therapy Vitals  Pulse Rate (!) 104  Mobility  Activity Ambulated in hall  Level of Assistance Independent after set-up  Assistive Device None  Distance Ambulated (ft) 715 ft  Mobility Ambulated independently in hallway  Mobility Response Tolerated well  Mobility performed by Mobility specialist  Bed Position Chair  $Mobility charge 1 Mobility   During Mobility: HR 116 bpm Post Mobility: HR 104 bpm  Pt asx during ambulation, eager for d/c.  Nelta Numbers Mobility Specialist  Phone [phone]

## 2021-06-24 NOTE — Discharge Summary (Signed)
Physician Discharge Summary  Collin Young BWL:[id number] DOB: [birth year] DOA: 06/11/2021  PCP: Collin Mclean, MD  Admit date: 06/11/2021 Discharge date: 06/24/2021 30 Day Unplanned Readmission Risk Score    Flowsheet Row ED to Hosp-Admission (Current) from 06/11/2021 in [facility]  30 Day Unplanned Readmission Risk Score (%) 27.1 Filed at 06/24/2021 0801       This score is the patient's risk of an unplanned readmission within 30 days of being discharged (0 -100%). The score is based on dignosis, age, lab data, medications, orders, and past utilization.   Low:  0-14.9   Medium: 15-21.9   High: 22-29.9   Extreme: 30 and above          Admitted From: Home Disposition: Home  Recommendations for Outpatient Follow-up:  Follow up with PCP in 1-2 weeks Please obtain BMP/CBC in one week Follow-up with cardiology/heart failure team in 1 week Please follow up with your PCP on the following pending results: Unresulted Labs (From admission, onward)     Start     Ordered   06/21/21 0500  Magnesium  Daily,   R     Question:  Specimen collection method  Answer:  Unit=Unit collect   06/20/21 1614   06/17/21 0500  Cooxemetry Panel (carboxy, met, total hgb, O2 sat)  Daily,   R     Question:  Specimen collection method  Answer:  Lab=Lab collect   06/16/21 1159   06/17/21 0437  CBC  Daily,   R     Question:  Specimen collection method  Answer:  Lab=Lab collect   06/17/21 0436              Home Health: None Equipment/Devices: None  Discharge Condition: Stable CODE STATUS: Full code Diet recommendation: Low-sodium  Subjective: Seen and examined.  No complaints.  No shortness of breath.  He wants to go home.  Heart failure team has cleared the patient for discharge.  I have also discussed discharge plans with patient's daughter over the phone.  Brief/Interim Summary: Patient is [name] with past medical history significant  for systolic and diastolic CHF, COPD, prior smoker, CAD, esophageal and prostate cancer s/p chemo and radiation, GERD, prior ETOH abuse, seizures, HTN, and HLD.  Patient presented to emergency room with shortness of breath.    On ED admit patient was treated for acute COPD exacerbation with BDs and supplemental oxygen. CTA chest was obtained that revealed lobar/subsegmental PE's on right with large right and small left pleural effusions. Given PE and pleural effusion PCCM consult for pulmonary eval. patient was initially started on heparin drip which was later transitioned to Eliquis.  Patient also was found to have large right-sided pleural effusion for which he underwent thoracentesis on 06/11/2021 with a yield of 1.2 clear yellow fluid which was transudate of by lights criteria.  Cardiology was consulted and recommended increasing diuresis.  This led to increasing creatinine.  Also lead to severely elevated LFTs suggesting acute shock liver.  Cytology of pleural fluid showed reactive mesothelial cells.  Patient was eventually transferred to [facility] on 06/16/2021 for cardiac cath.   Acute on chronic systolic heart failure: -Heart failure team is directing care. -Milrinone has been discontinued.  Cor-ox stable.  Off milrinone for past 3 days now. -Patient is currently on Imdur, hydralazine, Entresto, Aldactone, ivabradine, digoxin. -[place] is being resumed per heart failure recommendations. -Acute kidney injury has improved with optimization of the heart failure treatment.  Acute pulmonary embolism: -Patient is on Eliquis. -Echocardiogram did not reveal right ventricular strain.   COPD with exacerbation: Treated with steroids and bronchodilators and now stable.   Acute kidney injury on chronic kidney disease stage IIIa versus AKI: -AKI has resolved significantly. -Serum creatinine in fact better than his baseline.   Possible hospital-acquired pneumonia: -Patient completed IV  cefepime course.   Shock liver: -Likely secondary to acute congestive heart failure with right heart failure involvement.  Patient was evaluated by GI.  Patient came in with normal LFTs which peaked around 4000 level and now AST 107 and ALT 537.  Vital hepatitis ruled out.  Large right-sided pleural effusion: -Status postthoracentesis as mentioned above.  Thrombocytopenia: The lowest was 68 on 06/17/2021.  Improving.  Currently 142.   History of esophageal cancer and throat cancer: -Status postchemotherapy and radiotherapy treatment.  Hyponatremia: Improved with tolvaptan.  Currently 127.  Heart failure team has cleared him for discharge with current sodium level.  Discharge Diagnoses:  Active Problems:   Acute on chronic systolic congestive heart failure ([place])   Palliative care by specialist   Pulmonary embolism ([place])   Elevated LFTs   DNR (do not resuscitate)   Weakness generalized   SOB (shortness of breath)    Discharge Instructions   Allergies as of 06/24/2021   No Known Allergies      Medication List     STOP taking these medications    furosemide 40 MG tablet Commonly known as: Lasix   metoprolol succinate 25 MG 24 hr tablet Commonly known as: TOPROL-XL       TAKE these medications    albuterol 108 (90 Base) MCG/ACT inhaler Commonly known as: VENTOLIN HFA INHALE 2 PUFFS INTO THE LUNGS EVERY 6 HOURS AS NEEDED FOR WHEEZING OR SHORTNESS OF BREATH   apixaban 5 MG Tabs tablet Commonly known as: ELIQUIS Take 1 tablet (5 mg total) by mouth 2 (two) times daily.   Bayer Low Dose 81 MG EC tablet Generic drug: aspirin Take 81 mg by mouth daily. Swallow whole.   dapagliflozin propanediol 10 MG Tabs tablet Commonly known as: FARXIGA Take 1 tablet (10 mg total) by mouth daily.   digoxin 0.125 MG tablet Commonly known as: LANOXIN Take 1 tablet (0.125 mg total) by mouth daily.   Entresto 24-26 MG Generic drug: sacubitril-valsartan TAKE 1 TABLET BY  MOUTH TWICE DAILY What changed: how much to take   fentaNYL 12 MCG/HR Commonly known as: [place] 1 patch onto the skin every 3 (three) days.   hydrALAZINE 25 MG tablet Commonly known as: APRESOLINE Take 1.5 tablets (37.5 mg total) by mouth every 8 (eight) hours.   isosorbide mononitrate 30 MG 24 hr tablet Commonly known as: IMDUR Take 1 tablet (30 mg total) by mouth daily.   ivabradine 5 MG Tabs tablet Commonly known as: CORLANOR Take 0.5 tablets (2.5 mg total) by mouth 2 (two) times daily with a meal.   multivitamin tablet Take 1 tablet by mouth 3 (three) times a week.   polyethylene glycol 17 g packet Commonly known as: MIRALAX / GLYCOLAX Take 17 g by mouth daily as needed for mild constipation. What changed: reasons to take this   pravastatin 80 MG tablet Commonly known as: PRAVACHOL Take 1 tablet (80 mg total) by mouth every evening.   Repatha SureClick 997 MG/ML [place] Generic drug: Evolocumab Inject 140 mg into the skin every 14 (fourteen) days.   spironolactone 25 MG tablet Commonly known as: ALDACTONE Take 0.5 tablets (  12.5 mg total) by mouth daily.        Follow-up Information     Copland, Gay Filler, MD Follow up in 1 week(s).   Specialty: Family Medicine Contact information: [address] [place] [address] [phone]         Skeet Latch, MD .   Specialty: Cardiology Contact information: [address] [place] [address] [phone]                No Known Allergies  Consultations: GI, PCCM, cardiology, heart failure team   Procedures/Studies: DG Chest 1 View  Result Date: 06/14/2021 CLINICAL DATA:  New onset shortness of breath EXAM: PORTABLE CHEST 1 VIEW COMPARISON:  06/11/2021 FINDINGS: Cardiac shadow is enlarged but stable. Aortic calcifications are noted. Recurrent right-sided pleural effusion is noted. No focal infiltrate is seen. No bony abnormality is noted. IMPRESSION:  Recurrent small right-sided pleural effusion. Electronically Signed   By: Inez Catalina M.D.   On: 06/14/2021 02:21   DG Chest 2 View  Result Date: 06/11/2021 CLINICAL DATA:  Shortness of breath.  COPD. EXAM: CHEST - 2 VIEW COMPARISON:  06/02/2021 FINDINGS: Shallow inspiration. Heart size and pulmonary vascularity are normal for technique. Small bilateral pleural effusions, greater on the right. Infiltration or atelectasis in the right lung base, likely pneumonia. There is progression since the previous study. Calcified and tortuous aorta. No pneumothorax. IMPRESSION: Small bilateral pleural effusions, greater on the right. Infiltration or atelectasis in the right lung base likely represents pneumonia. Progression since prior study. Electronically Signed   By: Lucienne Capers M.D.   On: 06/11/2021 01:28   DG Chest 2 View  Result Date: 06/02/2021 CLINICAL DATA:  Dyspnea EXAM: CHEST - 2 VIEW COMPARISON:  05/07/2021 FINDINGS: Check shadow is enlarged but stable. Aortic calcifications are again seen. Right-sided pleural effusion is noted stable from the prior exam. Some persistent airspace opacity is noted in the right base although improved from the prior study. Small left effusion is noted as well. IMPRESSION: Bilateral pleural effusions right greater than left. Persistent but improved airspace opacity in the right base. Electronically Signed   By: Inez Catalina M.D.   On: 06/02/2021 03:52   CT Angio Chest PE W/Cm &/Or Wo Cm  Result Date: 06/11/2021 CLINICAL DATA:  Shortness of breath.  Pulmonary embolism suspected EXAM: CT ANGIOGRAPHY CHEST WITH CONTRAST TECHNIQUE: Multidetector CT imaging of the chest was performed using the standard protocol during bolus administration of intravenous contrast. Multiplanar CT image reconstructions and MIPs were obtained to evaluate the vascular anatomy. CONTRAST:  21mL OMNIPAQUE IOHEXOL 350 MG/ML SOLN COMPARISON:  05/08/2021 FINDINGS: Cardiovascular: Enlarged heart.  Motion degraded study but still positive for a tubular filling defect in lobar to segmental right lower lobe pulmonary arteries, nonocclusive. Bilateral posterior basal segment left lower lobe pulmonary arteries are nonenhancing and small, suspect remote emboli. No RV dilatation when compared to the left ventricle. No enhancement of the systemic arterial tree. Atheromatous calcification of the aorta and coronaries. Mediastinum/Nodes: Negative for adenopathy or mass. Lungs/Pleura: Large right and small left pleural effusions. Interlobular septal thickening. No pneumothorax. No consolidation. Fibrotic scar-like appearance in the apical lungs. Upper Abdomen: Atheromatous calcification. Left hilar calcification is likely atheromatous based on prior CT. Musculoskeletal: No acute finding. Review of the MIP images confirms the above findings. Critical Value/emergent results were called by telephone at the time of interpretation on 06/11/2021 at 5:18 am to provider [facility] , who verbally acknowledged these results. IMPRESSION:  1. Positive for lobar to segmental pulmonary embolism in the right lower lobe. 2. Chronic poor enhancement of lower lobe segmental vessels which may be related to remote emboli or chronic shunting away from compressive atelectasis. 3. Large right and small left pleural effusion similar to CT 1 month ago. Mild interstitial edema. 4. Aortic Atherosclerosis (ICD10-I70.0).  Coronary atherosclerosis. Electronically Signed   By: Jorje Guild M.D.   On: 06/11/2021 05:20   CARDIAC CATHETERIZATION  Result Date: 06/16/2021 Findings: RA = 15 RV =  35/14 PA = 36/17 (25) PCW = 13 Fick cardiac output/index = 2.9/1.8 Thermo CO/CI = 3.1/1.9 PVR = 4.0 WU SVR = 2,166 Ao sat = 99% PA sat = 55%, 56% PaPI = 1.3 Assessment: Cardiogenic shock with R>L heart failure Plan/Discussion: Overall prognosis appears quite poor. Not candidate for advanced therapies. Will start inotropes and assess response. Glori Bickers, MD 1:21 PM  DG Chest Port 1 View  Result Date: 06/11/2021 CLINICAL DATA:  Status post thoracentesis. EXAM: PORTABLE CHEST 1 VIEW COMPARISON:  June 11, 2021. FINDINGS: No pneumothorax is noted. Right pleural effusion is significantly smaller. IMPRESSION: No pneumothorax status post thoracentesis. Electronically Signed   By: Marijo Conception M.D.   On: 06/11/2021 14:33   ECHOCARDIOGRAM COMPLETE  Result Date: 06/02/2021    ECHOCARDIOGRAM REPORT   Patient Name:   Collin Young Date of Exam: 06/02/2021 Medical Rec #:  [id number]        Height:       66.0 in Accession #:    [id number]       Weight:       130.0 lb Date of Birth:  02-04-1951        BSA:          1.665 m Patient Age:    73 years         BP:           145/85 mmHg Patient Gender: M                HR:           100 bpm. Exam Location:  Inpatient Procedure: 2D Echo, 3D Echo, Cardiac Doppler and Color Doppler Indications:    CHF-Acute Systolic W73.71  History:        Patient has prior history of Echocardiogram examinations, most                 recent 05/08/2021. CAD, COPD; Risk Factors:Former Smoker,                 Hypertension and Dyslipidemia. GERD. History of cancer.  Sonographer:    Darlina Sicilian RDCS Referring Phys: [id number] [place]  1. Left ventricular ejection fraction, by estimation, is <20%. The left ventricle has severely decreased function. The left ventricle demonstrates global hypokinesis. The left ventricular internal cavity size was mildly dilated. Left ventricular diastolic parameters are consistent with Grade III diastolic dysfunction (restrictive). Elevated left atrial pressure. The average left ventricular global longitudinal strain is -7.4 %. The global longitudinal strain is abnormal.  2. Right ventricular systolic function is mildly reduced. The right ventricular size is normal. There is moderately elevated pulmonary artery systolic pressure.  3. Left atrial size was mildly dilated.  4. The mitral  valve is normal in structure. Moderate mitral valve regurgitation. No evidence of mitral stenosis.  5. Tricuspid valve regurgitation is mild to moderate.  6. The aortic valve is tricuspid. Aortic valve regurgitation is mild. Mild aortic  valve sclerosis is present, with no evidence of aortic valve stenosis.  7. The inferior vena cava is normal in size with <50% respiratory variability, suggesting right atrial pressure of 8 mmHg. FINDINGS  Left Ventricle: Left ventricular ejection fraction, by estimation, is <20%. The left ventricle has severely decreased function. The left ventricle demonstrates global hypokinesis. The average left ventricular global longitudinal strain is -7.4 %. The global longitudinal strain is abnormal. The left ventricular internal cavity size was mildly dilated. There is no left ventricular hypertrophy. Left ventricular diastolic parameters are consistent with Grade III diastolic dysfunction (restrictive). Elevated left atrial pressure. Right Ventricle: The right ventricular size is normal. Right ventricular systolic function is mildly reduced. There is moderately elevated pulmonary artery systolic pressure. The tricuspid regurgitant velocity is 3.20 m/s, and with an assumed right atrial pressure of 8 mmHg, the estimated right ventricular systolic pressure is 25.4 mmHg. Left Atrium: Left atrial size was mildly dilated. Right Atrium: Right atrial size was normal in size. Pericardium: There is no evidence of pericardial effusion. Mitral Valve: The mitral valve is normal in structure. Moderate mitral valve regurgitation. No evidence of mitral valve stenosis. Tricuspid Valve: The tricuspid valve is normal in structure. Tricuspid valve regurgitation is mild to moderate. No evidence of tricuspid stenosis. Aortic Valve: The aortic valve is tricuspid. Aortic valve regurgitation is mild. Aortic regurgitation PHT measures 316 msec. Mild aortic valve sclerosis is present, with no evidence of aortic valve  stenosis. Pulmonic Valve: The pulmonic valve was normal in structure. Pulmonic valve regurgitation is mild. No evidence of pulmonic stenosis. Aorta: The aortic root is normal in size and structure. Venous: The inferior vena cava is normal in size with less than 50% respiratory variability, suggesting right atrial pressure of 8 mmHg. IAS/Shunts: No atrial level shunt detected by color flow Doppler.  LEFT VENTRICLE PLAX 2D LVIDd:         5.30 cm  Diastology LVIDs:         5.10 cm  LV e' medial:    4.94 cm/s LV PW:         0.70 cm  LV E/e' medial:  21.3 LV IVS:        0.80 cm  LV e' lateral:   5.43 cm/s LVOT diam:     2.10 cm  LV E/e' lateral: 19.4 LV SV:         30 LV SV Index:   18       2D Longitudinal Strain LVOT Area:     3.46 cm 2D Strain GLS Avg:     -7.4 %                          3D Volume EF:                         3D EF:        23 %                         LV EDV:       220 ml                         LV ESV:       169 ml                         LV SV:  51 ml RIGHT VENTRICLE RV S prime:     6.00 cm/s TAPSE (M-mode): 1.0 cm LEFT ATRIUM             Index       RIGHT ATRIUM           Index LA diam:        4.60 cm 2.76 cm/m  RA Area:     17.00 cm LA Vol (A2C):   46.9 ml 28.16 ml/m RA Volume:   51.20 ml  30.75 ml/m LA Vol (A4C):   48.6 ml 29.18 ml/m LA Biplane Vol: 48.8 ml 29.31 ml/m  AORTIC VALVE             PULMONIC VALVE LVOT Vmax:   64.60 cm/s  PR End Diast Vel: 17.47 msec LVOT Vmean:  37.400 cm/s LVOT VTI:    0.087 m AI PHT:      316 msec  AORTA Ao Root diam: 3.00 cm Ao Asc diam:  2.70 cm MITRAL VALVE                 TRICUSPID VALVE MV Area (PHT): 2.82 cm      TR Peak grad:   41.0 mmHg MV Decel Time: 269 msec      TR Vmax:        320.00 cm/s MR Peak grad:    63.4 mmHg MR Mean grad:    39.0 mmHg   SHUNTS MR Vmax:         398.00 cm/s Systemic VTI:  0.09 m MR Vmean:        287.0 cm/s  Systemic Diam: 2.10 cm MR PISA:         1.57 cm MR PISA Eff ROA: 16 mm MR PISA Radius:  0.50 cm MV E velocity:  105.25 cm/s Kirk Ruths MD Electronically signed by Kirk Ruths MD Signature Date/Time: 06/02/2021/4:08:31 PM    Final    US LIVER DOPPLER  Result Date: 06/15/2021 CLINICAL DATA:  Elevated LFTs EXAM: DUPLEX ULTRASOUND OF LIVER TECHNIQUE: Color and duplex Doppler ultrasound was performed to evaluate the hepatic in-flow and out-flow vessels. COMPARISON:  06/15/2021 FINDINGS: Liver: Normal parenchymal echogenicity. Normal hepatic contour without nodularity. No focal lesion, mass or intrahepatic biliary ductal dilatation. Main Portal Vein size: 1.3 cm Portal Vein Velocities Main Prox:  27 cm/sec Main Mid: 37 cm/sec Main Dist:  33 cm/sec Right: 25 cm/sec Left: 27 cm/sec Hepatic Vein Velocities Right:  90 cm/sec Middle:  61 cm/sec Left:  81 cm/sec IVC: Present and patent with normal respiratory phasicity. Hepatic Artery Velocity:  101 cm/sec Splenic Vein Velocity:  38 cm/sec Spleen: 5.6 cm x 6.9 cm x 5.0 cm with a total volume of 101 cm^3 (411 cm^3 is upper limit normal) Portal Vein Occlusion/Thrombus: No Splenic Vein Occlusion/Thrombus: No Ascites: Trace ascites in the upper abdomen Varices: None Patent portal, hepatic and splenic veins with normal directional flow. Negative for portal vein occlusion or thrombus. Trace abdominal upper abdominal ascites IMPRESSION: Normal hepatic venous Doppler. Trace upper abdominal ascites Electronically Signed   By: Jerilynn Mages.  Shick M.D.   On: 06/15/2021 11:00   ECHOCARDIOGRAM LIMITED  Result Date: 06/11/2021    ECHOCARDIOGRAM LIMITED REPORT   Patient Name:   Collin Young Date of Exam: 06/11/2021 Medical Rec #:  [id number]        Height:       66.0 in Accession #:    [id number]       Weight:  136.7 lb Date of Birth:  September 17, 1950        BSA:          1.701 m Patient Age:    6 years         BP:           137/87 mmHg Patient Gender: M                HR:           104 bpm. Exam Location:  Inpatient Procedure: Limited Echo, Limited Color Doppler and Cardiac Doppler  Indications:    CHF-Acute Systolic B52.08  History:        Patient has prior history of Echocardiogram examinations, most                 recent 06/02/2021. CHF and Idiopathic CMP, CAD, COPD; Risk                 Factors:ETOH, Dyslipidemia and Hypertension.  Sonographer:    Bernadene Person RDCS Referring Phys: [id number] [place]  1. Limited study no complete doppler/color flow. EF 20% global hypokinesis RV normal size and function Moderate LAE, mild RAE. IVC dilated Moderate TR mild AR with AV sclerosis ? pleural effusion no pericardial effusion.  2. Left ventricular ejection fraction, by estimation, is 20%. The left ventricle has severely decreased function. The left ventricle has no regional wall motion abnormalities. The left ventricular internal cavity size was severely dilated. Left ventricular diastolic function could not be evaluated.  3. Right ventricular systolic function is normal. The right ventricular size is normal. There is mildly elevated pulmonary artery systolic pressure.  4. Left atrial size was moderately dilated.  5. Right atrial size was mildly dilated.  6. Moderate pleural effusion in the left lateral region.  7. The mitral valve was not assessed. No evidence of mitral valve regurgitation. No evidence of mitral stenosis.  8. Tricuspid valve regurgitation is moderate.  9. The aortic valve is abnormal. Aortic valve regurgitation is mild. Mild to moderate aortic valve sclerosis/calcification is present, without any evidence of aortic stenosis. 10. The inferior vena cava is dilated in size with >50% respiratory variability, suggesting right atrial pressure of 8 mmHg. FINDINGS  Left Ventricle: Left ventricular ejection fraction, by estimation, is 20%. The left ventricle has severely decreased function. The left ventricle has no regional wall motion abnormalities. The left ventricular internal cavity size was severely dilated. There is no left ventricular hypertrophy. Left ventricular  diastolic function could not be evaluated. Right Ventricle: The right ventricular size is normal. No increase in right ventricular wall thickness. Right ventricular systolic function is normal. There is mildly elevated pulmonary artery systolic pressure. The tricuspid regurgitant velocity is 3.08  m/s, and with an assumed right atrial pressure of 3 mmHg, the estimated right ventricular systolic pressure is 22.4 mmHg. Left Atrium: Left atrial size was moderately dilated. Right Atrium: Right atrial size was mildly dilated. Pericardium: There is no evidence of pericardial effusion. Mitral Valve: The mitral valve was not assessed. No evidence of mitral valve stenosis. Tricuspid Valve: The tricuspid valve is normal in structure. Tricuspid valve regurgitation is moderate . No evidence of tricuspid stenosis. Aortic Valve: The aortic valve is abnormal. Aortic valve regurgitation is mild. Mild to moderate aortic valve sclerosis/calcification is present, without any evidence of aortic stenosis. Pulmonic Valve: The pulmonic valve was normal in structure. Pulmonic valve regurgitation is not visualized. No evidence of pulmonic stenosis. Aorta: The aortic root is normal  in size and structure and aortic root could not be assessed. Venous: The inferior vena cava is dilated in size with greater than 50% respiratory variability, suggesting right atrial pressure of 8 mmHg. IAS/Shunts: The interatrial septum was not assessed. Additional Comments: Limited study no complete doppler/color flow. EF 20% global hypokinesis RV normal size and function Moderate LAE, mild RAE. IVC dilated Moderate TR mild AR with AV sclerosis ? pleural effusion no pericardial effusion. There is a moderate pleural effusion in the left lateral region. RIGHT VENTRICLE RV S prime:     7.51 cm/s TAPSE (M-mode): 1.3 cm RIGHT ATRIUM           Index RA Area:     17.00 cm RA Volume:   48.50 ml  28.51 ml/m  TRICUSPID VALVE TR Peak grad:   37.9 mmHg TR Vmax:         308.00 cm/s Jenkins Rouge MD Electronically signed by Jenkins Rouge MD Signature Date/Time: 06/11/2021/2:48:25 PM    Final    [place] EKG SITE RITE  Result Date: 06/19/2021 If Site Rite image not attached, placement could not be confirmed due to current cardiac rhythm.  [place] EKG SITE RITE  Result Date: 06/19/2021 If Site Rite image not attached, placement could not be confirmed due to current cardiac rhythm.  US Abdomen Limited RUQ (LIVER/GB)  Result Date: 06/15/2021 CLINICAL DATA:  Elevated LFTs EXAM: ULTRASOUND ABDOMEN LIMITED RIGHT UPPER QUADRANT COMPARISON:  None. FINDINGS: Gallbladder: Thickened gallbladder wall measuring 1 cm. This may related to chronic hepatic disease, hypoproteinemia or CHF. No elicited Murphy's sign or pericholecystic fluid. No gallstones. Common bile duct: Diameter: 3.8 mm Liver: No focal lesion identified. Within normal limits in parenchymal echogenicity. Portal vein is patent on color Doppler imaging with normal direction of blood flow towards the liver. Other: None. IMPRESSION: Nonspecific gallbladder wall thickening.  See above comment. No definitive signs of cholecystitis No biliary dilatation Electronically Signed   By: Jerilynn Mages.  Shick M.D.   On: 06/15/2021 11:02     Discharge Exam: Vitals:   06/24/21 0405 06/24/21 0754  BP: (!) 114/53   Pulse: (!) 103   Resp: 14   Temp: 97.7 F (36.5 C) 97.9 F (36.6 C)  SpO2: 97%    Vitals:   06/23/21 1925 06/23/21 2340 06/24/21 0405 06/24/21 0754  BP: 134/70 128/71 (!) 114/53   Pulse: (!) 104 (!) 103 (!) 103   Resp: _0 Temp: 97.9 F (36.6 C) 97.9 F (36.6 C) 97.7 F (36.5 C) 97.9 F (36.6 C)  TempSrc: Oral Oral Oral Oral  SpO2: 98% 98% 97%   Weight:   (P) 55.1 kg   Height:        General: Pt is alert, awake, not in acute distress Cardiovascular: RRR, S1/S2 +, no rubs, no gallops Respiratory: CTA bilaterally, no wheezing, no rhonchi Abdominal: Soft, NT, ND, bowel sounds + Extremities: no edema, no  cyanosis    The results of significant diagnostics from this hospitalization (including imaging, microbiology, ancillary and laboratory) are listed below for reference.     Microbiology: Recent Results (from the past 240 hour(s))  Culture, group A strep     Status: None   Collection Time: 06/15/21  5:04 PM   Specimen: Throat  Result Value Ref Range Status   Specimen Description   Final    THROAT Performed at [facility] [address] [place], [place] [address]    Special Requests   Final  NONE Performed at [facility], [place] [address] [place], [place] [address]    Culture   Final    NO GROUP A STREP (S.PYOGENES) ISOLATED Performed at [facility], [place] [address] [place], [place] [address]    Report Status 06/18/2021 FINAL  Final  MRSA Next Gen by PCR, Nasal     Status: None   Collection Time: 06/16/21  4:24 PM   Specimen: Nasal Mucosa; Nasal Swab  Result Value Ref Range Status   MRSA by PCR Next Gen NOT DETECTED NOT DETECTED Final    Comment: (NOTE) The GeneXpert MRSA Assay (FDA approved for NASAL specimens only), is one component of a comprehensive MRSA colonization surveillance program. It is not intended to diagnose MRSA infection nor to guide or monitor treatment for MRSA infections. Test performance is not FDA approved in patients less than 39 years old. Performed at [facility], [place] [address], [place], [place] [address]      Labs: BNP (last 3 results) Recent Labs    05/11/21 0523 06/02/21 0311 06/11/21 0225  BNP 1,797.3* 2,342.9* >1,173.5*   Basic Metabolic Panel: Recent Labs  Lab 06/19/21 0331 06/20/21 0328 06/21/21 0207 06/21/21 1630 06/22/21 0443 06/23/21 0648 06/24/21 0405  NA 131* 127* 125* 128* 132* 130* 127*  K 4.3 4.3 4.2  --  4.6 4.5 4.5  CL 95* 93* 92*  --  99 96* 94*  CO2 _0 --  _1 GLUCOSE 85 107* 123*  --  138* 93 104*  BUN 29* 16 18  --  _2 CREATININE  0.96 0.82 0.91  --  0.90 0.88 0.89  CALCIUM 8.1* 7.6* 7.6*  --  8.7* 8.7* 8.6*  MG 2.1  --  2.0  --  2.1 1.8 2.3  PHOS  --   --   --   --   --  2.3*  --    Liver Function Tests: Recent Labs  Lab 06/18/21 0352 06/19/21 0331 06/20/21 0328 06/23/21 0648 06/24/21 0405  AST 1,340* 790* 454*  --  107*  ALT 2,315* 1,812* 1,316*  --  537*  ALKPHOS 54 55 53  --  59  BILITOT 1.7* 1.4* 1.3*  --  0.8  PROT 5.7* 5.4* 5.4*  --  6.1*  ALBUMIN 2.8* 2.7* 2.6* 2.7* 2.7*   No results for input(s): LIPASE, AMYLASE in the last 168 hours. No results for input(s): AMMONIA in the last 168 hours. CBC: Recent Labs  Lab 06/20/21 0328 06/21/21 0207 06/22/21 0443 06/23/21 0649 06/24/21 0405  WBC 7.2 7.5 7.8 7.8 6.9  NEUTROABS  --   --   --  5.0  --   HGB 11.5* 11.8* 12.4* 12.2* 12.5*  HCT 36.0* 37.1* 40.9 39.0 39.5  MCV 84.7 84.5 86.3 86.7 84.9  PLT 91* 105* 125* 142* 140*   Cardiac Enzymes: No results for input(s): CKTOTAL, CKMB, CKMBINDEX, TROPONINI in the last 168 hours. BNP: Invalid input(s): POCBNP CBG: Recent Labs  Lab 06/17/21 1059 06/17/21 1610 06/17/21 1926  GLUCAP 158* 131* 157*   D-Dimer No results for input(s): DDIMER in the last 72 hours. Hgb A1c No results for input(s): HGBA1C in the last 72 hours. Lipid Profile No results for input(s): CHOL, HDL, LDLCALC, TRIG, CHOLHDL, LDLDIRECT in the last 72 hours. Thyroid function studies No results for input(s): TSH, T4TOTAL, T3FREE, THYROIDAB in the last 72 hours.  Invalid input(s): FREET3 Anemia work up No results for  input(s): VITAMINB12, FOLATE, FERRITIN, TIBC, IRON, RETICCTPCT in the last 72 hours. Urinalysis    Component Value Date/Time   COLORURINE YELLOW 05/07/2021 2345   APPEARANCEUR HAZY (A) 05/07/2021 2345   APPEARANCEUR Clear 05/29/2020 1556   LABSPEC 1.023 05/07/2021 2345   PHURINE 5.0 05/07/2021 2345   GLUCOSEU NEGATIVE 05/07/2021 2345   HGBUR NEGATIVE 05/07/2021 2345   BILIRUBINUR NEGATIVE 05/07/2021 2345    BILIRUBINUR Negative 05/29/2020 1556   [place] 05/07/2021 2345   PROTEINUR 100 (A) 05/07/2021 2345   UROBILINOGEN 1.0 08/04/2014 2007   NITRITE NEGATIVE 05/07/2021 2345   LEUKOCYTESUR TRACE (A) 05/07/2021 2345   Sepsis Labs Invalid input(s): PROCALCITONIN,  WBC,  LACTICIDVEN Microbiology Recent Results (from the past 240 hour(s))  Culture, group A strep     Status: None   Collection Time: 06/15/21  5:04 PM   Specimen: Throat  Result Value Ref Range Status   Specimen Description   Final    THROAT Performed at [facility], [place] [address], [place], [place] [address]    Special Requests   Final    NONE Performed at [facility], [place] [address], [place], [place] [address]    Culture   Final    NO GROUP A STREP (S.PYOGENES) ISOLATED Performed at [facility], [place] [address], [place], [place] [address]    Report Status 06/18/2021 FINAL  Final  MRSA Next Gen by PCR, Nasal     Status: None   Collection Time: 06/16/21  4:24 PM   Specimen: Nasal Mucosa; Nasal Swab  Result Value Ref Range Status   MRSA by PCR Next Gen NOT DETECTED NOT DETECTED Final    Comment: (NOTE) The GeneXpert MRSA Assay (FDA approved for NASAL specimens only), is one component of a comprehensive MRSA colonization surveillance program. It is not intended to diagnose MRSA infection nor to guide or monitor treatment for MRSA infections. Test performance is not FDA approved in patients less than 49 years old. Performed at [facility], [place] [address], [place], [place] [address]      Time coordinating discharge: Over 30 minutes  SIGNED:   Darliss Cheney, MD  [facility] 06/24/2021, 9:11 AM  If 7PM-7AM, please contact night-coverage [email or web address]

## 2021-06-25 ENCOUNTER — Telehealth: Payer: Self-pay | Admitting: *Deleted

## 2021-06-25 NOTE — Chronic Care Management (AMB) (Signed)
  Chronic Care Management   Note  06/25/2021 Name: Collin Young MRN: [id number] DOB: 20-Feb-1951  Collin Young is a 70 y.o. year old male who is a primary care patient of Copland, Gay Filler, MD. I reached out to Collin Young by phone today in response to a referral sent by Collin Young's PCP.  Collin Young was given information about Chronic Care Management services today including:  CCM service includes personalized support from designated clinical staff supervised by his physician, including individualized plan of care and coordination with other care providers 24/7 contact phone numbers for assistance for urgent and routine care needs. Service will only be billed when office clinical staff spend 20 minutes or more in a month to coordinate care. Only one practitioner may furnish and bill the service in a calendar month. The patient may stop CCM services at any time (effective at the end of the month) by phone call to the office staff. The patient is responsible for co-pay (up to 20% after annual deductible is met) if co-pay is required by the individual health plan.   Patient agreed to services and verbal consent obtained.   Follow up plan: Telephone appointment with care management team member scheduled for: 07/17/2021  Julian Hy, [place] Management  Direct Dial: [phone]

## 2021-06-26 ENCOUNTER — Telehealth: Payer: Self-pay

## 2021-06-26 ENCOUNTER — Ambulatory Visit (HOSPITAL_BASED_OUTPATIENT_CLINIC_OR_DEPARTMENT_OTHER): Payer: Medicare Other | Admitting: Cardiovascular Disease

## 2021-06-26 NOTE — Telephone Encounter (Signed)
Transition Care Management Follow-up Telephone Call Date of discharge and from where: 06/24/2021-[facility] How have you been since you were released from the hospital? Per daughter, doing great Any questions or concerns? No  Items Reviewed: Did the pt receive and understand the discharge instructions provided? Yes  Medications obtained and verified? Yes  Other? Yes  Any new allergies since your discharge? No  Dietary orders reviewed? Yes Do you have support at home? No but someone checks on patient everyday  Home Care and Equipment/Supplies: Were home health services ordered? yes If so, what is the name of the agency? [facility]  Has the agency set up a time to come to the patient's home? yes Were any new equipment or medical supplies ordered?  No What is the name of the medical supply agency? N/a Were you able to get the supplies/equipment? not applicable Do you have any questions related to the use of the equipment or supplies? N/a  Functional Questionnaire: (I = Independent and D = Dependent) ADLs: I  Bathing/Dressing- I  Meal Prep- I  Eating- I  Maintaining continence- I  Transferring/Ambulation- I  Managing Meds- I WITH ASSISTANCE  Follow up appointments reviewed:  PCP Hospital f/u appt confirmed? Yes  Scheduled to see Dr. Lorelei Pont on 07/03/2021 @ 10:40. [facility] f/u appt confirmed? Yes Scheduled to see Dr. Marigene Ehlers on 07/04/2021 @ 10:40. Are transportation arrangements needed? No  If their condition worsens, is the pt aware to call PCP or go to the Emergency Dept.? Yes Was the patient provided with contact information for the PCP's office or ED? Yes Was to pt encouraged to call back with questions or concerns? Yes

## 2021-07-01 NOTE — Progress Notes (Addendum)
[facility] [facility] [employer] [place], [place], [place] [address] [phone]  Date:  07/03/2021   Name:  Collin Young   DOB:  08-14-1951   MRN:  [id number]  PCP:  Darreld Mclean, MD    Chief Complaint: Hospitalization Follow-up and Cough (Ongoing cough )   History of Present Illness:  Collin Young is a 70 y.o. very pleasant male patient who presents with the following:  Seen today for follow-up from recent hospital admission for worsening CHF and exacerbation of same He has an appointment with CHF team tomorrow His most recent echo on October 12 showed EF of approximately 20%, severe left ventricle dilatation He also had a large right pleural effusion underwent thoracentesis on 10/12  Here today with daughter Collin Young  Admit date: 06/11/2021 Discharge date: 06/24/2021 30 Day Unplanned Readmission Risk Score     Recommendations for Outpatient Follow-up:  Follow up with PCP in 1-2 weeks Please obtain BMP/CBC in one week Follow-up with cardiology/heart failure team in 1 week Please follow up with your PCP on the following pending results: Unresulted Labs (From admission, onward)     Brief/Interim Summary: Patient is [name] with past medical history significant for systolic and diastolic CHF, COPD, prior smoker, CAD, esophageal and prostate cancer s/p chemo and radiation, GERD, prior ETOH abuse, seizures, HTN, and HLD.  Patient presented to emergency room with shortness of breath.     On ED admit patient was treated for acute COPD exacerbation with BDs and supplemental oxygen. CTA chest was obtained that revealed lobar/subsegmental PE's on right with large right and small left pleural effusions. Given PE and pleural effusion PCCM consult for pulmonary eval. patient was initially started on heparin drip which was later transitioned to Eliquis.  Patient also was found to have large right-sided  pleural effusion for which he underwent thoracentesis on 06/11/2021 with a yield of 1.2 clear yellow fluid which was transudate of by lights criteria.  Cardiology was consulted and recommended increasing diuresis.  This led to increasing creatinine.  Also lead to severely elevated LFTs suggesting acute shock liver.  Cytology of pleural fluid showed reactive mesothelial cells.  Patient was eventually transferred to [facility] on 06/16/2021 for cardiac cath.   Acute on chronic systolic heart failure: -Heart failure team is directing care. -Milrinone has been discontinued.  Cor-ox stable.  Off milrinone for past 3 days now. -Patient is currently on Imdur, hydralazine, Entresto, Aldactone, ivabradine, digoxin. -Wilder Glade is being resumed per heart failure recommendations. -Acute kidney injury has improved with optimization of the heart failure treatment Acute pulmonary embolism: -Patient is on Eliquis. -Echocardiogram did not reveal right ventricular strain. COPD with exacerbation: Treated with steroids and bronchodilators and now stable. Acute kidney injury on chronic kidney disease stage IIIa versus AKI: -AKI has resolved significantly. -Serum creatinine in fact better than his baseline.  Possible hospital-acquired pneumonia: -Patient completed IV cefepime course. Shock liver: -Likely secondary to acute congestive heart failure with right heart failure involvement.  Patient was evaluated by GI.  Patient came in with normal LFTs which peaked around 4000 level and now AST 107 and ALT 537.  Vital hepatitis ruled out. Large right-sided pleural effusion: -Status postthoracentesis as mentioned above. Thrombocytopenia: The lowest was 68 on 06/17/2021.  Improving.  Currently 142. History of esophageal cancer and throat cancer: -Status postchemotherapy and radiotherapy treatment. Hyponatremia: Improved with tolvaptan.  Currently 127.  Heart failure team  has cleared him for discharge with  current sodium level.   Discharge Diagnoses:  Active Problems:   Acute on chronic systolic congestive heart failure ([place])   Palliative care by specialist   Pulmonary embolism ([place])   Elevated LFTs   DNR (do not resuscitate)   Weakness generalized   SOB (shortness of breath)   Today Banks states he is feeling well.  He feels like his breathing is okay.  He does notice some congestion in his throat over the last few days, has been using an over-the-counter cold medication.  He denies any history of glaucoma, I will call in Atrovent nasal for him  He has been taking Jardiance prior to hospitalization, was sent home with [place].  I advised that likely either medication is okay, I am not certain why this change was made.  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  He is eager to get back to his work, it gives him a lot of enjoyment.  I advised him that most likely he is okay to work, but avoid exhausting himself  Patient Active Problem List   Diagnosis Date Noted   SOB (shortness of breath)    DNR (do not resuscitate)    Weakness generalized    Elevated LFTs    Pulmonary embolism ([place]) 06/11/2021   Acute on chronic heart failure ([place]) 06/02/2021   GERD without esophagitis 05/08/2021   Mixed hyperlipidemia 05/08/2021   Hypertensive urgency 05/08/2021   Elevated troponin level not due myocardial infarction 05/08/2021   Pure hypercholesterolemia 09/23/2020   Claudication in peripheral vascular disease ([place]) 01/25/2020   Late latent syphilis 01/15/2020   Coronary artery disease involving native coronary artery of native heart 07/05/2019   Chronic kidney disease, stage 3a ([place]) 07/05/2019   Combined systolic and diastolic heart failure ([place]) 09/26/2018   Malignant neoplasm of prostate ([place]) 06/17/2018   Medication management    Dysphagia    Palliative care by specialist    Ischemic cardiomyopathy    Congestive dilated cardiomyopathy  ([place])    Acute on chronic systolic congestive heart failure ([place]) 01/02/2017   Malignant neoplasm of pyriform sinus ([place]) 09/28/2013   Piriform sinus tumor 09/24/2013   NONSPECIFIC ABN FINDING RAD & OTH EXAM GI TRACT 05/14/2009    Past Medical History:  Diagnosis Date   Chronic combined systolic and diastolic CHF (congestive heart failure) ([place])    followed by [name]   COPD (chronic obstructive pulmonary disease) ([place])    Coronary artery disease cardiologist-- [name]   per cardiac cath 01-05-2017  chronic total occlusion pLCx with collaterals, 99% severe calcified prox. to mid RCA, otherwise mild to moderate CAD (medically managed)   Esophageal cancer, stage IIIB [facility]) oncologist-  dr ennever/  dr moody   dx 2010  SCC Stage IIIB completed chemoradiation;  localized recurrent left piriform sinus 01/ 2015,  completed concurrent chemoradiatoin 04/ 2015   GERD (gastroesophageal reflux disease)    09-07-2018   no issues since Gtube removed 06/ 2019   History of alcohol abuse    quit 2001   History of cancer chemotherapy    2010;   2015   History of radiation therapy    10-19-2013 to  12-05-2013 pyriform sinus 69.96 Gy/46fx;   Radiation completed 2010 for esophageal cancer   History of seizure 2001   alcohol withdrawal   HOH (hard of hearing)    Hyperlipidemia    Hypertension  Ischemic cardiomyopathy 12/2016   01-03-2017  echo,  ef 10-15%/   echo 05-06-2017 EF improved to 45-50%   Prostate cancer [facility]) urologist-  dr ottelin/  oncologist-- [name]   dx 05-27-2018--- Stage T1c,  Gleason 4+3,  PSA 9.3--  scheduled for brachytherapy 09-23-2017   Renal cyst, left     Past Surgical History:  Procedure Laterality Date   CARDIOVASCULAR STRESS TEST  10/09/09   normal nuclearr stress test, EF 57% [name])   CENTRAL LINE INSERTION  06/16/2021   Procedure: CENTRAL LINE INSERTION;  Surgeon: Jolaine Artist, MD;  Location: [place] [facility];  Service:  Cardiovascular;;   IR GASTROSTOMY TUBE MOD SED  01/13/2017   IR GASTROSTOMY TUBE REMOVAL  02/03/2018   IR PATIENT EVAL TECH 0-60 MINS  03/25/2017   IR REMOVAL TUN ACCESS W/ PORT W/O FL MOD SED  01/15/2017   IR REPLACE G-TUBE SIMPLE WO FLUORO  01/13/2018   LARYNGOSCOPY N/A 09/15/2013   Procedure: LARYNGOSCOPY;  Surgeon: [name], MD;  Location: [place];  Service: ENT;  Laterality: N/A;  direct laryngoscopy with biopsy and esophagoscopy   RADIOACTIVE SEED IMPLANT N/A 09/23/2018   Procedure: RADIOACTIVE SEED IMPLANT/BRACHYTHERAPY IMPLANT;  Surgeon: [name], MD;  Location: [place];  Service: Urology;  Laterality: N/A;   RIGHT HEART CATH N/A 06/16/2021   Procedure: RIGHT HEART CATH;  Surgeon: Jolaine Artist, MD;  Location: [place] [facility];  Service: Cardiovascular;  Laterality: N/A;   RIGHT/LEFT HEART CATH AND CORONARY ANGIOGRAPHY N/A 01/05/2017   Procedure: Right/Left Heart Cath and Coronary Angiography;  Surgeon: [name], MD;  Location: [place] [facility];  Service: Cardiovascular;  Laterality: N/A;   TRANSTHORACIC ECHOCARDIOGRAM  05/06/2017   ef 45-50%,  grade 1 diastolic dysfunction/  AV severe calcified non coronary cusp with moderate regurg. , no stenosis (valve area per echo 01-05-2017 1.08cm^2)/  mild MR, TR, and PR/  mild LAE    Social History   Tobacco Use   Smoking status: Former    Packs/day: 1.00    Years: 40.00    Pack years: 40.00    Types: Cigarettes    Start date: 11/08/1960    Quit date: 08/31/2009    Years since quitting: 11.8   Smokeless tobacco: Former    Types: Chew    Quit date: 09/01/1999  Vaping Use   Vaping Use: Never used  Substance Use Topics   Alcohol use: Not Currently    Alcohol/week: 0.0 standard drinks    Comment: quit in 2001   Drug use: No    Comment: back in the day used cocaine,alcohol, marijuana    Family History  Problem Relation Age of Onset   Breast cancer Mother    Prostate cancer Neg Hx    Colon  cancer Neg Hx    Pancreatic cancer Neg Hx     No Known Allergies  Medication list has been reviewed and updated.  Current Outpatient Medications on File Prior to Visit  Medication Sig Dispense Refill   apixaban (ELIQUIS) 5 MG TABS tablet Take 1 tablet (5 mg total) by mouth 2 (two) times daily. 60 tablet 0   BAYER LOW DOSE 81 MG EC tablet Take 81 mg by mouth daily. Swallow whole.     dapagliflozin propanediol (FARXIGA) 10 MG TABS tablet Take 1 tablet (10 mg total) by mouth daily. 30 tablet 2   digoxin (LANOXIN) 0.125 MG tablet Take 1 tablet (0.125 mg total) by mouth daily.  30 tablet 0   ENTRESTO 24-26 MG TAKE 1 TABLET BY MOUTH TWICE DAILY (Patient taking differently: Take 0.5 tablets by mouth 2 (two) times daily.) 180 tablet 3   Evolocumab (REPATHA SURECLICK) 314 MG/ML SOAJ Inject 140 mg into the skin every 14 (fourteen) days. 2 mL 11   fentaNYL (DURAGESIC) 12 MCG/HR Place 1 patch onto the skin every 3 (three) days. 10 patch 0   hydrALAZINE (APRESOLINE) 25 MG tablet Take 1.5 tablets (37.5 mg total) by mouth every 8 (eight) hours. 135 tablet 0   isosorbide mononitrate (IMDUR) 30 MG 24 hr tablet Take 1 tablet (30 mg total) by mouth daily. 30 tablet 0   ivabradine (CORLANOR) 5 MG TABS tablet Take 1/2 tablet (2.5 mg total) by mouth 2 (two) times daily with a meal. 30 tablet 0   Multiple Vitamin (MULTIVITAMIN) tablet Take 1 tablet by mouth 3 (three) times a week.     polyethylene glycol (MIRALAX / GLYCOLAX) 17 g packet Take 17 g by mouth daily as needed for mild constipation. (Patient taking differently: Take 17 g by mouth daily as needed for mild constipation (MIX AND DRINK AS DIRECTED).) 14 each 0   pravastatin (PRAVACHOL) 80 MG tablet Take 1 tablet (80 mg total) by mouth every evening. 90 tablet 3   spironolactone (ALDACTONE) 25 MG tablet Take 1/2 tablet (12.5 mg total) by mouth daily. 15 tablet 0   albuterol (VENTOLIN HFA) 108 (90 Base) MCG/ACT inhaler INHALE 2 PUFFS INTO THE LUNGS EVERY 6  HOURS AS NEEDED FOR WHEEZING OR SHORTNESS OF BREATH (Patient not taking: Reported on 07/03/2021) 20.1 g 1   Current Facility-Administered Medications on File Prior to Visit  Medication Dose Route Frequency Provider Last Rate Last Admin   topical emolient (BIAFINE) emulsion   Topical Daily Kyung Rudd, MD   Given at 01/19/14 9702    Review of Systems:  As per HPI- otherwise negative.    Physical Examination: Vitals:   07/03/21 1024  BP: 122/60  Pulse: 98  Temp: 97.6 F (36.4 C)  SpO2: 99%   Vitals:   07/03/21 1024  Weight: 134 lb 9.6 oz (61.1 kg)  Height: 5\' 6"  (1.676 m)   Body mass index is 21.73 kg/m. Ideal Body Weight: Weight in (lb) to have BMI = 25: 154.6  GEN: no acute distress.  Petite build, looks his normal self Some upper airway, throat congestion is present on exam, but lungs are clear HEENT: Atraumatic, Normocephalic.  Ears and Nose: No external deformity. CV: RRR, No M/G/R. No JVD. No thrill. No extra heart sounds. PULM: CTA B, no wheezes, crackles, rhonchi. No retractions. No resp. distress. No accessory muscle use. ABD: S, NT, ND EXTR: No c/c/e PSYCH: Normally interactive. Conversant.   Pt notes home weight is 125 in the am - he has weighed daily, 125 pounds each day this week Clinic weight is elevated but he is fully dressed Wt Readings from Last 3 Encounters:  07/03/21 134 lb 9.6 oz (61.1 kg)  06/24/21 (P) 121 lb 7.6 oz (55.1 kg)  06/04/21 136 lb 6.4 oz (61.9 kg)    Assessment and Plan: Hospital discharge follow-up  Acute on chronic combined systolic and diastolic CHF (congestive heart failure) ([place]) - Plan: CBC, Comprehensive metabolic panel  CRI (chronic renal insufficiency), stage 3 (moderate) (HCC)  Coronary artery disease involving native coronary artery of native heart without angina pectoris  PND (post-nasal drip) - Plan: ipratropium (ATROVENT) 0.03 % nasal spray  Patient seen today for hospital discharge  follow-up.  He unfortunately  has severe heart failure.  We discussed this some today, I explained that our goal will be to minimize his symptoms and maximize his function through medication, daily weights and a restricted salt diet.  He and his daughter understand  Appointment with CHF clinic tomorrow  Follow-up labs as above  Recommended COVID booster, they plan to get today  Atrovent nasal spray as needed for postnasal drainage  Signed Lamar Blinks, MD  Received labs as below, message to pt  Results for orders placed or performed in visit on 07/03/21  CBC  Result Value Ref Range   WBC 4.6 4.0 - 10.5 K/uL   RBC 4.18 (L) 4.22 - 5.81 Mil/uL   Platelets 289.0 150.0 - 400.0 K/uL   Hemoglobin 11.3 (L) 13.0 - 17.0 g/dL   HCT 35.5 (L) 39.0 - 52.0 %   MCV 84.8 78.0 - 100.0 fl   MCHC 31.9 30.0 - 36.0 g/dL   RDW 16.2 (H) 11.5 - 15.5 %  Comprehensive metabolic panel  Result Value Ref Range   Sodium 136 135 - 145 mEq/L   Potassium 4.2 3.5 - 5.1 mEq/L   Chloride 98 96 - 112 mEq/L   CO2 32 19 - 32 mEq/L   Glucose, Bld 86 70 - 99 mg/dL   BUN 19 6 - 23 mg/dL   Creatinine, Ser 1.06 0.40 - 1.50 mg/dL   Total Bilirubin 0.5 0.2 - 1.2 mg/dL   Alkaline Phosphatase 56 39 - 117 U/L   AST 26 0 - 37 U/L   ALT 84 (H) 0 - 53 U/L   Total Protein 6.6 6.0 - 8.3 g/dL   Albumin 3.6 3.5 - 5.2 g/dL   GFR 71.05 >60.00 mL/min   Calcium 9.2 8.4 - 10.5 mg/dL

## 2021-07-02 ENCOUNTER — Telehealth: Payer: Self-pay | Admitting: Pharmacist

## 2021-07-02 ENCOUNTER — Other Ambulatory Visit: Payer: Self-pay

## 2021-07-02 NOTE — Telephone Encounter (Signed)
Transitions of Care Pharmacy  ° °Call attempted for a pharmacy transitions of care follow-up. HIPAA appropriate voicemail was left with call back information provided.  ° °Call attempt #1. Will follow-up in 2-3 days.  °  °

## 2021-07-03 ENCOUNTER — Encounter: Payer: Self-pay | Admitting: Family Medicine

## 2021-07-03 ENCOUNTER — Other Ambulatory Visit: Payer: Self-pay

## 2021-07-03 ENCOUNTER — Ambulatory Visit: Payer: Medicare Other | Attending: Internal Medicine

## 2021-07-03 ENCOUNTER — Ambulatory Visit (INDEPENDENT_AMBULATORY_CARE_PROVIDER_SITE_OTHER): Payer: Medicare Other | Admitting: Family Medicine

## 2021-07-03 VITALS — BP 122/60 | HR 98 | Temp 97.6°F | Ht 66.0 in | Wt 134.6 lb

## 2021-07-03 DIAGNOSIS — Z23 Encounter for immunization: Secondary | ICD-10-CM

## 2021-07-03 DIAGNOSIS — I2510 Atherosclerotic heart disease of native coronary artery without angina pectoris: Secondary | ICD-10-CM | POA: Diagnosis not present

## 2021-07-03 DIAGNOSIS — N183 Chronic kidney disease, stage 3 unspecified: Secondary | ICD-10-CM | POA: Diagnosis not present

## 2021-07-03 DIAGNOSIS — Z09 Encounter for follow-up examination after completed treatment for conditions other than malignant neoplasm: Secondary | ICD-10-CM

## 2021-07-03 DIAGNOSIS — I5043 Acute on chronic combined systolic (congestive) and diastolic (congestive) heart failure: Secondary | ICD-10-CM | POA: Diagnosis not present

## 2021-07-03 DIAGNOSIS — R0982 Postnasal drip: Secondary | ICD-10-CM | POA: Diagnosis not present

## 2021-07-03 LAB — COMPREHENSIVE METABOLIC PANEL
ALT: 84 U/L — ABNORMAL HIGH (ref 0–53)
AST: 26 U/L (ref 0–37)
Albumin: 3.6 g/dL (ref 3.5–5.2)
Alkaline Phosphatase: 56 U/L (ref 39–117)
BUN: 19 mg/dL (ref 6–23)
CO2: 32 mEq/L (ref 19–32)
Calcium: 9.2 mg/dL (ref 8.4–10.5)
Chloride: 98 mEq/L (ref 96–112)
Creatinine, Ser: 1.06 mg/dL (ref 0.40–1.50)
GFR: 71.05 mL/min (ref >60.00)
Glucose, Bld: 86 mg/dL (ref 70–99)
Potassium: 4.2 mEq/L (ref 3.5–5.1)
Sodium: 136 mEq/L (ref 135–145)
Total Bilirubin: 0.5 mg/dL (ref 0.2–1.2)
Total Protein: 6.6 g/dL (ref 6.0–8.3)

## 2021-07-03 LAB — CBC
HCT: 35.5 % — ABNORMAL LOW (ref 39.0–52.0)
Hemoglobin: 11.3 g/dL — ABNORMAL LOW (ref 13.0–17.0)
MCHC: 31.9 g/dL (ref 30.0–36.0)
MCV: 84.8 fl (ref 78.0–100.0)
Platelets: 289 10*3/uL (ref 150.0–400.0)
RBC: 4.18 Mil/uL — ABNORMAL LOW (ref 4.22–5.81)
RDW: 16.2 % — ABNORMAL HIGH (ref 11.5–15.5)
WBC: 4.6 10*3/uL (ref 4.0–10.5)

## 2021-07-03 MED ORDER — IPRATROPIUM BROMIDE 0.03 % NA SOLN: 2.0000 | Freq: Two times a day (BID) | NASAL | 12 refills | Status: AC

## 2021-07-03 NOTE — Progress Notes (Signed)
   Covid-19 Vaccination Clinic  Name:  Collin Young    MRN: [id number] DOB: July 26, 1951  07/03/2021  Mr. [name] was observed post Covid-19 immunization for 15 minutes without incident. He was provided with Vaccine Information Sheet and instruction to access the V-Safe system.   Mr. Fill was instructed to call 911 with any severe reactions post vaccine: Difficulty breathing  Swelling of face and throat  A fast heartbeat  A bad rash all over body  Dizziness and weakness   Immunizations Administered     Name Date Dose VIS Date Route   Moderna Covid-19 vaccine Bivalent Booster 07/03/2021 11:41 AM 0.5 mL 04/12/2021 Intramuscular   Manufacturer: Moderna   Lot: [id number]   [place]: 30104-045-91

## 2021-07-03 NOTE — Patient Instructions (Signed)
Good to see you again today!  Take care and I will be in touch with your labs asap  Continue to record a daily weight every morning  I think you can take either [name] - I don't think there is a major difference here, but please run it by Dr Aundra Dubin

## 2021-07-04 ENCOUNTER — Other Ambulatory Visit: Payer: Self-pay

## 2021-07-04 ENCOUNTER — Ambulatory Visit (HOSPITAL_BASED_OUTPATIENT_CLINIC_OR_DEPARTMENT_OTHER)
Admission: RE | Admit: 2021-07-04 | Discharge: 2021-07-04 | Disposition: A | Discharge status: Home / Self Care | Payer: Medicare Other | Source: Ambulatory Visit | Attending: Cardiology | Admitting: Cardiology

## 2021-07-04 VITALS — BP 146/84 | HR 98 | Ht 66.0 in | Wt 136.2 lb

## 2021-07-04 DIAGNOSIS — I2601 Septic pulmonary embolism with acute cor pulmonale: Secondary | ICD-10-CM | POA: Diagnosis not present

## 2021-07-04 DIAGNOSIS — I255 Ischemic cardiomyopathy: Secondary | ICD-10-CM | POA: Insufficient documentation

## 2021-07-04 DIAGNOSIS — E872 Acidosis, unspecified: Secondary | ICD-10-CM | POA: Diagnosis not present

## 2021-07-04 DIAGNOSIS — R0602 Shortness of breath: Secondary | ICD-10-CM | POA: Diagnosis not present

## 2021-07-04 DIAGNOSIS — Z91119 Patient's noncompliance with dietary regimen due to unspecified reason: Secondary | ICD-10-CM | POA: Diagnosis not present

## 2021-07-04 DIAGNOSIS — I5043 Acute on chronic combined systolic (congestive) and diastolic (congestive) heart failure: Secondary | ICD-10-CM | POA: Diagnosis not present

## 2021-07-04 DIAGNOSIS — Z8546 Personal history of malignant neoplasm of prostate: Secondary | ICD-10-CM | POA: Insufficient documentation

## 2021-07-04 DIAGNOSIS — I5022 Chronic systolic (congestive) heart failure: Secondary | ICD-10-CM | POA: Diagnosis not present

## 2021-07-04 DIAGNOSIS — I2699 Other pulmonary embolism without acute cor pulmonale: Secondary | ICD-10-CM | POA: Insufficient documentation

## 2021-07-04 DIAGNOSIS — Z8501 Personal history of malignant neoplasm of esophagus: Secondary | ICD-10-CM | POA: Insufficient documentation

## 2021-07-04 DIAGNOSIS — I130 Hypertensive heart and chronic kidney disease with heart failure and stage 1 through stage 4 chronic kidney disease, or unspecified chronic kidney disease: Secondary | ICD-10-CM | POA: Insufficient documentation

## 2021-07-04 DIAGNOSIS — I110 Hypertensive heart disease with heart failure: Secondary | ICD-10-CM | POA: Diagnosis not present

## 2021-07-04 DIAGNOSIS — J9601 Acute respiratory failure with hypoxia: Secondary | ICD-10-CM | POA: Diagnosis not present

## 2021-07-04 DIAGNOSIS — I2510 Atherosclerotic heart disease of native coronary artery without angina pectoris: Secondary | ICD-10-CM | POA: Insufficient documentation

## 2021-07-04 DIAGNOSIS — R6889 Other general symptoms and signs: Secondary | ICD-10-CM | POA: Diagnosis not present

## 2021-07-04 DIAGNOSIS — E785 Hyperlipidemia, unspecified: Secondary | ICD-10-CM | POA: Insufficient documentation

## 2021-07-04 DIAGNOSIS — Z743 Need for continuous supervision: Secondary | ICD-10-CM | POA: Diagnosis not present

## 2021-07-04 DIAGNOSIS — E871 Hypo-osmolality and hyponatremia: Secondary | ICD-10-CM | POA: Diagnosis not present

## 2021-07-04 DIAGNOSIS — R64 Cachexia: Secondary | ICD-10-CM | POA: Diagnosis not present

## 2021-07-04 DIAGNOSIS — Z79899 Other long term (current) drug therapy: Secondary | ICD-10-CM | POA: Diagnosis not present

## 2021-07-04 DIAGNOSIS — Z9221 Personal history of antineoplastic chemotherapy: Secondary | ICD-10-CM | POA: Insufficient documentation

## 2021-07-04 DIAGNOSIS — Z8522 Personal history of malignant neoplasm of nasal cavities, middle ear, and accessory sinuses: Secondary | ICD-10-CM | POA: Diagnosis not present

## 2021-07-04 DIAGNOSIS — I5021 Acute systolic (congestive) heart failure: Secondary | ICD-10-CM | POA: Diagnosis not present

## 2021-07-04 DIAGNOSIS — E782 Mixed hyperlipidemia: Secondary | ICD-10-CM | POA: Diagnosis not present

## 2021-07-04 DIAGNOSIS — Z7901 Long term (current) use of anticoagulants: Secondary | ICD-10-CM | POA: Insufficient documentation

## 2021-07-04 DIAGNOSIS — J449 Chronic obstructive pulmonary disease, unspecified: Secondary | ICD-10-CM | POA: Diagnosis not present

## 2021-07-04 DIAGNOSIS — Z20822 Contact with and (suspected) exposure to covid-19: Secondary | ICD-10-CM | POA: Diagnosis not present

## 2021-07-04 DIAGNOSIS — Z923 Personal history of irradiation: Secondary | ICD-10-CM | POA: Insufficient documentation

## 2021-07-04 DIAGNOSIS — Z803 Family history of malignant neoplasm of breast: Secondary | ICD-10-CM | POA: Diagnosis not present

## 2021-07-04 DIAGNOSIS — I5023 Acute on chronic systolic (congestive) heart failure: Secondary | ICD-10-CM | POA: Diagnosis not present

## 2021-07-04 DIAGNOSIS — Z681 Body mass index (BMI) 19 or less, adult: Secondary | ICD-10-CM | POA: Diagnosis not present

## 2021-07-04 DIAGNOSIS — I517 Cardiomegaly: Secondary | ICD-10-CM | POA: Diagnosis not present

## 2021-07-04 DIAGNOSIS — I5042 Chronic combined systolic (congestive) and diastolic (congestive) heart failure: Secondary | ICD-10-CM

## 2021-07-04 DIAGNOSIS — Z85819 Personal history of malignant neoplasm of unspecified site of lip, oral cavity, and pharynx: Secondary | ICD-10-CM | POA: Insufficient documentation

## 2021-07-04 DIAGNOSIS — I509 Heart failure, unspecified: Secondary | ICD-10-CM | POA: Diagnosis not present

## 2021-07-04 DIAGNOSIS — N1831 Chronic kidney disease, stage 3a: Secondary | ICD-10-CM | POA: Insufficient documentation

## 2021-07-04 DIAGNOSIS — Z87891 Personal history of nicotine dependence: Secondary | ICD-10-CM | POA: Insufficient documentation

## 2021-07-04 DIAGNOSIS — Z86711 Personal history of pulmonary embolism: Secondary | ICD-10-CM | POA: Diagnosis not present

## 2021-07-04 DIAGNOSIS — J9811 Atelectasis: Secondary | ICD-10-CM | POA: Diagnosis not present

## 2021-07-04 DIAGNOSIS — J90 Pleural effusion, not elsewhere classified: Secondary | ICD-10-CM | POA: Insufficient documentation

## 2021-07-04 LAB — BASIC METABOLIC PANEL
Anion gap: 8 (ref 5–15)
BUN: 14 mg/dL (ref 8–23)
CO2: 30 mmol/L (ref 22–32)
Calcium: 9.1 mg/dL (ref 8.9–10.3)
Chloride: 98 mmol/L (ref 98–111)
Creatinine, Ser: 1.06 mg/dL (ref 0.61–1.24)
GFR, Estimated: >60 mL/min (ref >60)
Glucose, Bld: 109 mg/dL — ABNORMAL HIGH (ref 70–99)
Potassium: 4.2 mmol/L (ref 3.5–5.1)
Sodium: 136 mmol/L (ref 135–145)

## 2021-07-04 LAB — CBC
HCT: 36 % — ABNORMAL LOW (ref 39.0–52.0)
Hemoglobin: 10.9 g/dL — ABNORMAL LOW (ref 13.0–17.0)
MCH: 27 pg (ref 26.0–34.0)
MCHC: 30.3 g/dL (ref 30.0–36.0)
MCV: 89.3 fL (ref 80.0–100.0)
Platelets: 283 10*3/uL (ref 150–400)
RBC: 4.03 MIL/uL — ABNORMAL LOW (ref 4.22–5.81)
RDW: 15.9 % — ABNORMAL HIGH (ref 11.5–15.5)
WBC: 4.9 10*3/uL (ref 4.0–10.5)
nRBC: 0 % (ref 0.0–0.2)

## 2021-07-04 LAB — MAGNESIUM: Magnesium: 2 mg/dL (ref 1.7–2.4)

## 2021-07-04 LAB — DIGOXIN LEVEL: Digoxin Level: 1 ng/mL (ref 0.8–2.0)

## 2021-07-04 MED ORDER — ENTRESTO 49-51 MG PO TABS
1.0000 | ORAL_TABLET | Freq: Two times a day (BID) | ORAL | 11 refills | Status: DC
Start: 2021-07-04 — End: 2021-12-31

## 2021-07-04 MED ORDER — FUROSEMIDE 20 MG PO TABS: 20.0000 mg | ORAL_TABLET | ORAL | 3 refills | Status: DC

## 2021-07-04 NOTE — Progress Notes (Signed)
Advanced Heart Failure Clinic Note    PCP: Copland, Gay Filler, MD PCP-Cardiologist: Skeet Latch, MD  HF Cardiologist: Dr. Haroldine Laws  HPI: Collin Young is a 70 y.o.male w/ chronic combined systolic and diastolic CHF/ ischemic CM, severe multivessel CAD treated medically, 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     HF dates back to 12/2016. Echo then w/ severely reduced LVEF 10-15%, RV mild-mod reduced, mod AI/MR/TR. LHC showed severe calcific disease in the proximal to mid RCA and chronic total occlusion of the proximal circumflex.There was consideration of RCA PCI.  However, this would have required hemodynamic support and was deferred given his frailty and comorbidities. CAD managed medically.    Repeat Echo 05/2017, EF improved up to 45-50%, Mod AI. RV normal.     Echo 05/2020 EF down again, 35-40%. RV normal    Admitted to [facility] 9/22 w/ a/c CHF. Echo EF down 25-30%, RV not well visualized. Mod MR/AI. Chest CT showed  large right pleural effusion.  There was questionable PE on his CT but ultimately this was thought to be just artifact with poor opacification. Given w/ IV Lasix and discharged home on Entresto, Farxiga, Toprol XL and PRN lasix.    Presented back to [facility] ED on 10/12 via EMS w/ acute SOB and hypoxic respiratory failure. Repeat chest CT showed rt lower lobe PE + large rt pleural effusion and small left pleural effusion. Underwent Rt Thoracentesis, fluid analysis c/w transudative effusion. Heparin started for PE, eventually switched to Eliquis. Cardiology consulted for a/c CHF. Started on IV Lasix for diuresis. Limited Echo repeated, EF 20%, global HK, RV normal. Developed signs of low output and transferred to [facility] for RHC, which showed cardiogenic shock with R>L heart failure. Milrinone started. GDMT titrated and able to wean milrinone off. Not felt to be a VAD candidate due to cachexia and RV  failure. Discharged home, weight 121 lbs.  Today he returns for HF follow up with his daughter. She tells me he is very active and not used to "taking it easy." He does not have significant exertional dyspnea walking on flat ground. Has productive cough, no fever or chills. Overall feeling fine. Denies  CP, palpitations, abnormal  bleeding, dizziness, edema, or PND/Orthopnea. Appetite ok. Weight at home 125 pounds. Taking all medications. He has inhaler he uses for wheezing, non-smoker. Previously worked at [employer].  Review of Systems: [y] = yes, [ ]  = no   General: Weight gain [ ] ; Weight loss [ ] ; Anorexia [ ] ; Fatigue [ ] ; Fever [ ] ; Chills [ ] ; Weakness [ ]   Cardiac: Chest pain/pressure [ ] ; Resting SOB [ ] ; Exertional SOB Blue.Reese ]; Orthopnea [ ] ; Pedal Edema [ ] ; Palpitations [ ] ; Syncope [ ] ; Presyncope [ ] ; Paroxysmal nocturnal dyspnea[ ]   Pulmonary: Cough Blue.Reese ]; Wheezing[ ] ; Hemoptysis[ ] ; Sputum [ ] ; Snoring [ ]   GI: Vomiting[ ] ; Dysphagia[ ] ; Melena[ ] ; Hematochezia [ ] ; Heartburn[ ] ; Abdominal pain [ ] ; Constipation [ ] ; Diarrhea [ ] ; BRBPR [ ]   GU: Hematuria[ ] ; Dysuria [ ] ; Nocturia[ ]   Vascular: Pain in legs with walking [ ] ; Pain in feet with lying flat [ ] ; Non-healing sores [ ] ; Stroke [ ] ; TIA [ ] ; Slurred speech [ ] ;  Neuro: Headaches[ ] ; Vertigo[ ] ; Seizures[ ] ; Paresthesias[ ] ;Blurred vision [ ] ; Diplopia [ ] ; Vision changes [ ]   Ortho/Skin: Arthritis [ ] ; Joint pain [ ] ;  Muscle pain [ ] ; Joint swelling [ ] ; Back Pain [ ] ; Rash [ ]   Psych: Depression[ ] ; Anxiety[ ]   Heme: Bleeding problems [ ] ; Clotting disorders [ ] ; Anemia [ ]   Endocrine: Diabetes [ ] ; Thyroid dysfunction[ ]   Past Medical History:  Diagnosis Date   Chronic combined systolic and diastolic CHF (congestive heart failure) ([place])    followed by [name]   COPD (chronic obstructive pulmonary disease) ([place])    Coronary artery disease cardiologist-- [name]   per cardiac cath 01-05-2017   chronic total occlusion pLCx with collaterals, 99% severe calcified prox. to mid RCA, otherwise mild to moderate CAD (medically managed)   Esophageal cancer, stage IIIB [facility]) oncologist-  dr ennever/  dr moody   dx 2010  SCC Stage IIIB completed chemoradiation;  localized recurrent left piriform sinus 01/ 2015,  completed concurrent chemoradiatoin 04/ 2015   GERD (gastroesophageal reflux disease)    09-07-2018   no issues since Gtube removed 06/ 2019   History of alcohol abuse    quit 2001   History of cancer chemotherapy    2010;   2015   History of radiation therapy    10-19-2013 to  12-05-2013 pyriform sinus 69.96 Gy/37fx;   Radiation completed 2010 for esophageal cancer   History of seizure 2001   alcohol withdrawal   HOH (hard of hearing)    Hyperlipidemia    Hypertension    Ischemic cardiomyopathy 12/2016   01-03-2017  echo,  ef 10-15%/   echo 05-06-2017 EF improved to 45-50%   Prostate cancer [facility]) urologist-  dr ottelin/  oncologist-- [name]   dx 05-27-2018--- Stage T1c,  Gleason 4+3,  PSA 9.3--  scheduled for brachytherapy 09-23-2017   Renal cyst, left     Current Outpatient Medications  Medication Sig Dispense Refill   albuterol (VENTOLIN HFA) 108 (90 Base) MCG/ACT inhaler INHALE 2 PUFFS INTO THE LUNGS EVERY 6 HOURS AS NEEDED FOR WHEEZING OR SHORTNESS OF BREATH 20.1 g 1   apixaban (ELIQUIS) 5 MG TABS tablet Take 1 tablet (5 mg total) by mouth 2 (two) times daily. 60 tablet 0   BAYER LOW DOSE 81 MG EC tablet Take 81 mg by mouth daily. Swallow whole.     dapagliflozin propanediol (FARXIGA) 10 MG TABS tablet Take 1 tablet (10 mg total) by mouth daily. 30 tablet 2   digoxin (LANOXIN) 0.125 MG tablet Take 1 tablet (0.125 mg total) by mouth daily. 30 tablet 0   Evolocumab (REPATHA SURECLICK) 885 MG/ML SOAJ Inject 140 mg into the skin every 14 (fourteen) days. 2 mL 11   fentaNYL (DURAGESIC) 12 MCG/HR Place 1 patch onto the skin every 3 (three) days. (Patient taking differently:  Place 1 patch onto the skin as needed.) 10 patch 0   furosemide (LASIX) 20 MG tablet Take 1 tablet (20 mg total) by mouth 2 (two) times a week. On mondays and fridays 26 tablet 3   hydrALAZINE (APRESOLINE) 25 MG tablet Take 1.5 tablets (37.5 mg total) by mouth every 8 (eight) hours. 135 tablet 0   ipratropium (ATROVENT) 0.03 % nasal spray Place 2 sprays into both nostrils every 12 (twelve) hours. 30 mL 12   isosorbide mononitrate (IMDUR) 30 MG 24 hr tablet Take 1 tablet (30 mg total) by mouth daily. 30 tablet 0   ivabradine (CORLANOR) 5 MG TABS tablet Take 1/2 tablet (2.5 mg total) by mouth 2 (two) times daily with a meal. 30 tablet 0   polyethylene glycol (  MIRALAX / GLYCOLAX) 17 g packet Take 17 g by mouth daily as needed for mild constipation. (Patient taking differently: Take 17 g by mouth daily as needed for mild constipation (MIX AND DRINK AS DIRECTED).) 14 each 0   pravastatin (PRAVACHOL) 80 MG tablet Take 1 tablet (80 mg total) by mouth every evening. 90 tablet 3   sacubitril-valsartan (ENTRESTO) 49-51 MG Take 1 tablet by mouth 2 (two) times daily. 60 tablet 11   spironolactone (ALDACTONE) 25 MG tablet Take 1/2 tablet (12.5 mg total) by mouth daily. 15 tablet 0   Multiple Vitamin (MULTIVITAMIN) tablet Take 1 tablet by mouth 3 (three) times a week. (Patient not taking: Reported on 07/04/2021)     No current facility-administered medications for this encounter.   Facility-Administered Medications Ordered in Other Encounters  Medication Dose Route Frequency Provider Last Rate Last Admin   topical emolient (BIAFINE) emulsion   Topical Daily Kyung Rudd, MD   Given at 01/19/14 0912   No Known Allergies  Social History   Socioeconomic History   Marital status: Legally Separated    Spouse name: Not on file   Number of children: 2   Years of education: Not on file   Highest education level: Not on file  Occupational History   Occupation: retired  Tobacco Use   Smoking status: Former     Packs/day: 1.00    Years: 40.00    Pack years: 40.00    Types: Cigarettes    Start date: 11/08/1960    Quit date: 08/31/2009    Years since quitting: 11.8   Smokeless tobacco: Former    Types: Chew    Quit date: 09/01/1999  Vaping Use   Vaping Use: Never used  Substance and Sexual Activity   Alcohol use: Not Currently    Alcohol/week: 0.0 standard drinks    Comment: quit in 2001   Drug use: No    Comment: back in the day used cocaine,alcohol, marijuana   Sexual activity: Yes    Partners: Female    Birth control/protection: None  Other Topics Concern   Not on file  Social History Narrative   Not on file   Social Determinants of Health   Financial Resource Strain: Not on file  Food Insecurity: Not on file  Transportation Needs: Not on file  Physical Activity: Not on file  Stress: Not on file  Social Connections: Not on file  Intimate Partner Violence: Not on file   Family History  Problem Relation Age of Onset   Breast cancer Mother    Prostate cancer Neg Hx    Colon cancer Neg Hx    Pancreatic cancer Neg Hx    BP (!) 146/84   Pulse 98   Ht 5\' 6"  (1.676 m)   Wt 61.8 kg (136 lb 3.2 oz)   SpO2 99%   BMI 21.98 kg/m   Wt Readings from Last 3 Encounters:  07/04/21 61.8 kg (136 lb 3.2 oz)  07/03/21 61.1 kg (134 lb 9.6 oz)  06/24/21 (P) 55.1 kg (121 lb 7.6 oz)   PHYSICAL EXAM: General:  NAD. No resp difficulty, petite HEENT: Very HOH, hoarse  Neck: Supple. JVP to ear. Carotids 2+ bilat; no bruits. No lymphadenopathy or thryomegaly appreciated. Cor: PMI nondisplaced. Tachy rate & rhythm. No rubs, gallops or murmurs. Lungs: Wheezes upper lobes, faint crackles lower lobes Abdomen: Soft, nontender, nondistended. No hepatosplenomegaly. No bruits or masses. Good bowel sounds. Extremities: No cyanosis, clubbing, rash, edema Neuro: Alert & oriented x 3,  cranial nerves grossly intact. Moves all 4 extremities w/o difficulty. Affect pleasant.  ECG: ST 101 bpm (personally  reviewed)  ReDs: 36%  ASSESSMENT & PLAN: 1. Chronic Systolic Heart Failure -> Cardiogenic Shock  - Echo 2018 EF 10-15%, RV moderately reduced. LHC w/ MVCAD management medically - Echo 05/2020 EF down again, 35-40%. RV normal  - Echo 05/2021 EF down 25-30%, RV not well visualized. Mod MR and Mod AI. - Limited Echo 10/22: EF 20%, RV normal  - Admitted 10/22 w/ a/c CHF w/ cardiogenic shock w/ AKI + Shock liver - RHC 06/16/21 w/ R>L HF and low output, CI 1.8. PAPi 1.3  - NYHA II today, volume up some, ReDs 36% - Increase Entresto to 49/51 mg bid. - Add Lasix 20 mg Mondays and Fridays - Continue hydral 37.5 mg tid + Imdur 30 mg daily.  - Continue Spiro 12.5 daily.  - Continue Farxiga 10 mg daily. - Continue ivabradine 2.5 mg bid.  - Not felt to be VAD candidate due to cachexia and RV failure. Can reconsider as he improves - Labs today, repeat BMET in 10 days.   2. Pulmonary Embolism  - Limited echo w/ no RV strain  - Suspect incidental due HF - On Eliquis. No bleeding. - CBC today.    3. CKD Stage IIIa - Baseline SCr 1.3 - On SGLT2i. - Labs today.   4. Pleural Effusion, right - s/p thoracentesis by CCM 10/12 - 1200 cc removed, transudative  - Faint crackles on exam today, oxygen 99% on room air   5. H/o esophageal CA and throat CA - s/p chemo and XRT    Follow up with PharmD is 3 weeks for further medication titration (consider increasing Entresto or spiro), APP in 6 weeks, and Dr. Haroldine Laws + echo in 12 weeks.  Rafael Bihari, FNP 07/04/21   Patient seen and examined with the above-signed Advanced Practice Provider and/or Housestaff. I personally reviewed laboratory data, imaging studies and relevant notes. I independently examined the patient and formulated the important aspects of the plan. I have edited the note to reflect any of my changes or salient points. I have personally discussed the plan with the patient and/or family.  He is doing amazingly well after recent  admission for cardiogenic shock and subsegmental PE.  NYHA II-early III. Volume status mildly elevated. REDs 36%   General:  Thin male. No resp difficulty. Hoarse voice HEENT: normal Neck: supple.JVP 8-9. Carotids 2+ bilat; no bruits. No lymphadenopathy or thryomegaly appreciated. Cor: PMI nondisplaced. Regular rate & rhythm. No rubs, gallops or murmurs. Lungs: Faint basilar crackles Abdomen: soft, nontender, nondistended. No hepatosplenomegaly. No bruits or masses. Good bowel sounds. Extremities: no cyanosis, clubbing, rash, tr edema Neuro: alert & orientedx3, cranial nerves grossly intact. moves all 4 extremities w/o difficulty. Affect pleasant  Agree with plan to increase Entresto. Add lasix 20mg  M/F. Continue Eliquis for PE. If he can get some stronger and gain some weight may be able to consider VAD.   Glori Bickers, MD  5:53 PM

## 2021-07-04 NOTE — Progress Notes (Signed)
ReDS Vest / Clip - 07/04/21 1100       ReDS Vest / Clip   Station Marker C    Ruler Value 25    ReDS Value Range Moderate volume overload    ReDS Actual Value 36

## 2021-07-04 NOTE — Patient Instructions (Addendum)
EKG done today.  Labs done today. We will contact you only if your labs are abnormal.  INCREASE Entresto to 49-51mg  (1 tablet) by mouth 2 times daily. (A new prescription was sent into the pharmacy for you)  START Lasix 20mg  (1 tablet) by mouth on mondays and fridays.   No other medication changes were made. Please continue all current medications as prescribed.  Your physician recommends that you schedule a follow-up appointment in: 10 days for a lab only appointment, 3 weeks with our Clinic Pharmacist, 6 weeks with our NP/PA Clinic and in 12 weeks with Dr. Haroldine Laws with an echo prior to your appointment(all appointments here in our office)  Your physician has requested that you have an echocardiogram. Echocardiography is a painless test that uses sound waves to create images of your heart. It provides your doctor with information about the size and shape of your heart and how well your heart's chambers and valves are working. This procedure takes approximately one hour. There are no restrictions for this procedure.  If you have any questions or concerns before your next appointment please send [place] a message through [place] or call our office at [phone].    TO LEAVE A MESSAGE FOR THE NURSE SELECT OPTION 2, PLEASE LEAVE A MESSAGE INCLUDING: YOUR NAME DATE OF BIRTH CALL BACK NUMBER REASON FOR CALL**this is important as we prioritize the call backs  YOU WILL RECEIVE A CALL BACK THE SAME DAY AS LONG AS YOU CALL BEFORE 4:00 PM   Do the following things EVERYDAY: Weigh yourself in the morning before breakfast. Write it down and keep it in a log. Take your medicines as prescribed Eat low salt foods--Limit salt (sodium) to 2000 mg per day.  Stay as active as you can everyday Limit all fluids for the day to less than 2 liters   At [facility], you and your health needs are our priority. As part of our continuing mission to provide you with exceptional heart care,  we have created designated Provider Care Teams. These Care Teams include your primary Cardiologist (physician) and Advanced Practice Providers (APPs- Physician Assistants and Nurse Practitioners) who all work together to provide you with the care you need, when you need it.   You may see any of the following providers on your designated Care Team at your next follow up: Dr Glori Bickers Dr Haynes Kerns, NP Lyda Jester, [place] Audry Riles, PharmD   Please be sure to bring in all your medications bottles to every appointment.

## 2021-07-06 ENCOUNTER — Other Ambulatory Visit: Payer: Self-pay

## 2021-07-06 ENCOUNTER — Inpatient Hospital Stay (HOSPITAL_COMMUNITY)
Admission: EM | Admit: 2021-07-06 | Discharge: 2021-07-09 | DRG: 291 | Disposition: A | Discharge status: Home / Self Care | Payer: Medicare Other | Attending: Family Medicine | Admitting: Family Medicine

## 2021-07-06 ENCOUNTER — Encounter (HOSPITAL_COMMUNITY): Payer: Self-pay | Admitting: Emergency Medicine

## 2021-07-06 DIAGNOSIS — Z8522 Personal history of malignant neoplasm of nasal cavities, middle ear, and accessory sinuses: Secondary | ICD-10-CM

## 2021-07-06 DIAGNOSIS — Z803 Family history of malignant neoplasm of breast: Secondary | ICD-10-CM

## 2021-07-06 DIAGNOSIS — Z85819 Personal history of malignant neoplasm of unspecified site of lip, oral cavity, and pharynx: Secondary | ICD-10-CM

## 2021-07-06 DIAGNOSIS — Z91119 Patient's noncompliance with dietary regimen due to unspecified reason: Secondary | ICD-10-CM

## 2021-07-06 DIAGNOSIS — Z681 Body mass index (BMI) 19 or less, adult: Secondary | ICD-10-CM

## 2021-07-06 DIAGNOSIS — J9601 Acute respiratory failure with hypoxia: Secondary | ICD-10-CM | POA: Diagnosis present

## 2021-07-06 DIAGNOSIS — Z7901 Long term (current) use of anticoagulants: Secondary | ICD-10-CM

## 2021-07-06 DIAGNOSIS — I130 Hypertensive heart and chronic kidney disease with heart failure and stage 1 through stage 4 chronic kidney disease, or unspecified chronic kidney disease: Principal | ICD-10-CM | POA: Diagnosis present

## 2021-07-06 DIAGNOSIS — Z20822 Contact with and (suspected) exposure to covid-19: Secondary | ICD-10-CM | POA: Diagnosis present

## 2021-07-06 DIAGNOSIS — I5023 Acute on chronic systolic (congestive) heart failure: Secondary | ICD-10-CM | POA: Diagnosis present

## 2021-07-06 DIAGNOSIS — E782 Mixed hyperlipidemia: Secondary | ICD-10-CM | POA: Diagnosis present

## 2021-07-06 DIAGNOSIS — J449 Chronic obstructive pulmonary disease, unspecified: Secondary | ICD-10-CM | POA: Diagnosis present

## 2021-07-06 DIAGNOSIS — Z86711 Personal history of pulmonary embolism: Secondary | ICD-10-CM

## 2021-07-06 DIAGNOSIS — I2699 Other pulmonary embolism without acute cor pulmonale: Secondary | ICD-10-CM | POA: Diagnosis present

## 2021-07-06 DIAGNOSIS — Z923 Personal history of irradiation: Secondary | ICD-10-CM

## 2021-07-06 DIAGNOSIS — E872 Acidosis, unspecified: Secondary | ICD-10-CM | POA: Diagnosis present

## 2021-07-06 DIAGNOSIS — R64 Cachexia: Secondary | ICD-10-CM | POA: Diagnosis present

## 2021-07-06 DIAGNOSIS — Z7982 Long term (current) use of aspirin: Secondary | ICD-10-CM

## 2021-07-06 DIAGNOSIS — I5043 Acute on chronic combined systolic (congestive) and diastolic (congestive) heart failure: Principal | ICD-10-CM

## 2021-07-06 DIAGNOSIS — N1831 Chronic kidney disease, stage 3a: Secondary | ICD-10-CM | POA: Diagnosis present

## 2021-07-06 DIAGNOSIS — Z8501 Personal history of malignant neoplasm of esophagus: Secondary | ICD-10-CM

## 2021-07-06 DIAGNOSIS — E871 Hypo-osmolality and hyponatremia: Secondary | ICD-10-CM | POA: Diagnosis present

## 2021-07-06 DIAGNOSIS — Z9221 Personal history of antineoplastic chemotherapy: Secondary | ICD-10-CM

## 2021-07-06 DIAGNOSIS — Z79899 Other long term (current) drug therapy: Secondary | ICD-10-CM

## 2021-07-06 DIAGNOSIS — Z87891 Personal history of nicotine dependence: Secondary | ICD-10-CM

## 2021-07-06 DIAGNOSIS — I255 Ischemic cardiomyopathy: Secondary | ICD-10-CM | POA: Diagnosis present

## 2021-07-06 DIAGNOSIS — Z8546 Personal history of malignant neoplasm of prostate: Secondary | ICD-10-CM

## 2021-07-06 NOTE — ED Triage Notes (Signed)
Patient arrived with EMS from home reports worsening SOB onset yesterday , history of CHF , denies chest pain .

## 2021-07-07 ENCOUNTER — Emergency Department (HOSPITAL_COMMUNITY): Payer: Medicare Other

## 2021-07-07 ENCOUNTER — Encounter (HOSPITAL_COMMUNITY): Payer: Self-pay | Admitting: Family Medicine

## 2021-07-07 ENCOUNTER — Telehealth (HOSPITAL_COMMUNITY): Payer: Self-pay

## 2021-07-07 DIAGNOSIS — I2601 Septic pulmonary embolism with acute cor pulmonale: Secondary | ICD-10-CM | POA: Diagnosis not present

## 2021-07-07 DIAGNOSIS — J90 Pleural effusion, not elsewhere classified: Secondary | ICD-10-CM | POA: Diagnosis not present

## 2021-07-07 DIAGNOSIS — E871 Hypo-osmolality and hyponatremia: Secondary | ICD-10-CM | POA: Diagnosis not present

## 2021-07-07 DIAGNOSIS — I5021 Acute systolic (congestive) heart failure: Secondary | ICD-10-CM | POA: Diagnosis not present

## 2021-07-07 DIAGNOSIS — I5023 Acute on chronic systolic (congestive) heart failure: Secondary | ICD-10-CM | POA: Diagnosis not present

## 2021-07-07 DIAGNOSIS — Z7901 Long term (current) use of anticoagulants: Secondary | ICD-10-CM | POA: Diagnosis not present

## 2021-07-07 DIAGNOSIS — Z85819 Personal history of malignant neoplasm of unspecified site of lip, oral cavity, and pharynx: Secondary | ICD-10-CM | POA: Diagnosis not present

## 2021-07-07 DIAGNOSIS — Z923 Personal history of irradiation: Secondary | ICD-10-CM | POA: Diagnosis not present

## 2021-07-07 DIAGNOSIS — Z79899 Other long term (current) drug therapy: Secondary | ICD-10-CM | POA: Diagnosis not present

## 2021-07-07 DIAGNOSIS — Z9221 Personal history of antineoplastic chemotherapy: Secondary | ICD-10-CM | POA: Diagnosis not present

## 2021-07-07 DIAGNOSIS — I130 Hypertensive heart and chronic kidney disease with heart failure and stage 1 through stage 4 chronic kidney disease, or unspecified chronic kidney disease: Secondary | ICD-10-CM | POA: Diagnosis present

## 2021-07-07 DIAGNOSIS — J449 Chronic obstructive pulmonary disease, unspecified: Secondary | ICD-10-CM | POA: Diagnosis not present

## 2021-07-07 DIAGNOSIS — I517 Cardiomegaly: Secondary | ICD-10-CM | POA: Diagnosis not present

## 2021-07-07 DIAGNOSIS — E782 Mixed hyperlipidemia: Secondary | ICD-10-CM | POA: Diagnosis present

## 2021-07-07 DIAGNOSIS — N1831 Chronic kidney disease, stage 3a: Secondary | ICD-10-CM | POA: Diagnosis present

## 2021-07-07 DIAGNOSIS — Z87891 Personal history of nicotine dependence: Secondary | ICD-10-CM | POA: Diagnosis not present

## 2021-07-07 DIAGNOSIS — Z8522 Personal history of malignant neoplasm of nasal cavities, middle ear, and accessory sinuses: Secondary | ICD-10-CM | POA: Diagnosis not present

## 2021-07-07 DIAGNOSIS — J9811 Atelectasis: Secondary | ICD-10-CM | POA: Diagnosis not present

## 2021-07-07 DIAGNOSIS — I255 Ischemic cardiomyopathy: Secondary | ICD-10-CM | POA: Diagnosis present

## 2021-07-07 DIAGNOSIS — I5043 Acute on chronic combined systolic (congestive) and diastolic (congestive) heart failure: Secondary | ICD-10-CM | POA: Diagnosis not present

## 2021-07-07 DIAGNOSIS — Z8546 Personal history of malignant neoplasm of prostate: Secondary | ICD-10-CM | POA: Diagnosis not present

## 2021-07-07 DIAGNOSIS — Z91119 Patient's noncompliance with dietary regimen due to unspecified reason: Secondary | ICD-10-CM | POA: Diagnosis not present

## 2021-07-07 DIAGNOSIS — Z20822 Contact with and (suspected) exposure to covid-19: Secondary | ICD-10-CM | POA: Diagnosis present

## 2021-07-07 DIAGNOSIS — Z681 Body mass index (BMI) 19 or less, adult: Secondary | ICD-10-CM | POA: Diagnosis not present

## 2021-07-07 DIAGNOSIS — J9601 Acute respiratory failure with hypoxia: Secondary | ICD-10-CM | POA: Diagnosis present

## 2021-07-07 DIAGNOSIS — R0602 Shortness of breath: Secondary | ICD-10-CM | POA: Diagnosis not present

## 2021-07-07 DIAGNOSIS — E872 Acidosis, unspecified: Secondary | ICD-10-CM | POA: Diagnosis present

## 2021-07-07 DIAGNOSIS — I110 Hypertensive heart disease with heart failure: Secondary | ICD-10-CM | POA: Diagnosis not present

## 2021-07-07 DIAGNOSIS — I509 Heart failure, unspecified: Secondary | ICD-10-CM | POA: Diagnosis not present

## 2021-07-07 DIAGNOSIS — R64 Cachexia: Secondary | ICD-10-CM | POA: Diagnosis present

## 2021-07-07 DIAGNOSIS — Z86711 Personal history of pulmonary embolism: Secondary | ICD-10-CM | POA: Diagnosis not present

## 2021-07-07 DIAGNOSIS — Z803 Family history of malignant neoplasm of breast: Secondary | ICD-10-CM | POA: Diagnosis not present

## 2021-07-07 DIAGNOSIS — Z8501 Personal history of malignant neoplasm of esophagus: Secondary | ICD-10-CM | POA: Diagnosis not present

## 2021-07-07 LAB — RESP PANEL BY RT-PCR (FLU A&B, COVID) ARPGX2
Influenza A by PCR: NEGATIVE
Influenza B by PCR: NEGATIVE
SARS Coronavirus 2 by RT PCR: NEGATIVE

## 2021-07-07 LAB — MAGNESIUM: Magnesium: 2.1 mg/dL (ref 1.7–2.4)

## 2021-07-07 LAB — CBC WITH DIFFERENTIAL/PLATELET
Abs Immature Granulocytes: 0.03 10*3/uL (ref 0.00–0.07)
Basophils Absolute: 0 10*3/uL (ref 0.0–0.1)
Basophils Relative: 0 %
Eosinophils Absolute: 0.2 10*3/uL (ref 0.0–0.5)
Eosinophils Relative: 4 %
HCT: 36.7 % — ABNORMAL LOW (ref 39.0–52.0)
Hemoglobin: 11.7 g/dL — ABNORMAL LOW (ref 13.0–17.0)
Immature Granulocytes: 1 %
Lymphocytes Relative: 14 %
Lymphs Abs: 0.8 10*3/uL (ref 0.7–4.0)
MCH: 26.8 pg (ref 26.0–34.0)
MCHC: 31.9 g/dL (ref 30.0–36.0)
MCV: 84.2 fL (ref 80.0–100.0)
Monocytes Absolute: 0.9 10*3/uL (ref 0.1–1.0)
Monocytes Relative: 17 %
Neutro Abs: 3.5 10*3/uL (ref 1.7–7.7)
Neutrophils Relative %: 64 %
Platelets: 310 10*3/uL (ref 150–400)
RBC: 4.36 MIL/uL (ref 4.22–5.81)
RDW: 16 % — ABNORMAL HIGH (ref 11.5–15.5)
WBC: 5.3 10*3/uL (ref 4.0–10.5)
nRBC: 0 % (ref 0.0–0.2)

## 2021-07-07 LAB — COMPREHENSIVE METABOLIC PANEL
ALT: 56 U/L — ABNORMAL HIGH (ref 0–44)
AST: 30 U/L (ref 15–41)
Albumin: 3.2 g/dL — ABNORMAL LOW (ref 3.5–5.0)
Alkaline Phosphatase: 57 U/L (ref 38–126)
Anion gap: 10 (ref 5–15)
BUN: 26 mg/dL — ABNORMAL HIGH (ref 8–23)
CO2: 26 mmol/L (ref 22–32)
Calcium: 9.3 mg/dL (ref 8.9–10.3)
Chloride: 96 mmol/L — ABNORMAL LOW (ref 98–111)
Creatinine, Ser: 1.23 mg/dL (ref 0.61–1.24)
GFR, Estimated: >60 mL/min (ref >60)
Glucose, Bld: 130 mg/dL — ABNORMAL HIGH (ref 70–99)
Potassium: 4.7 mmol/L (ref 3.5–5.1)
Sodium: 132 mmol/L — ABNORMAL LOW (ref 135–145)
Total Bilirubin: 0.9 mg/dL (ref 0.3–1.2)
Total Protein: 6.9 g/dL (ref 6.5–8.1)

## 2021-07-07 LAB — BASIC METABOLIC PANEL
Anion gap: 7 (ref 5–15)
BUN: 22 mg/dL (ref 8–23)
CO2: 31 mmol/L (ref 22–32)
Calcium: 8.5 mg/dL — ABNORMAL LOW (ref 8.9–10.3)
Chloride: 93 mmol/L — ABNORMAL LOW (ref 98–111)
Creatinine, Ser: 1.24 mg/dL (ref 0.61–1.24)
GFR, Estimated: >60 mL/min (ref >60)
Glucose, Bld: 99 mg/dL (ref 70–99)
Potassium: 4.1 mmol/L (ref 3.5–5.1)
Sodium: 131 mmol/L — ABNORMAL LOW (ref 135–145)

## 2021-07-07 LAB — TROPONIN I (HIGH SENSITIVITY)
Troponin I (High Sensitivity): 45 ng/L — ABNORMAL HIGH (ref <18)
Troponin I (High Sensitivity): 47 ng/L — ABNORMAL HIGH

## 2021-07-07 LAB — DIGOXIN LEVEL: Digoxin Level: 0.7 ng/mL — ABNORMAL LOW (ref 0.8–2.0)

## 2021-07-07 LAB — BRAIN NATRIURETIC PEPTIDE: B Natriuretic Peptide: >4500 pg/mL — ABNORMAL HIGH (ref 0.0–100.0)

## 2021-07-07 MED ORDER — ALBUTEROL SULFATE (2.5 MG/3ML) 0.083% IN NEBU: 2.5000 mg | INHALATION_SOLUTION | Freq: Four times a day (QID) | RESPIRATORY_TRACT | Status: DC | PRN

## 2021-07-07 MED ORDER — PRAVASTATIN SODIUM 40 MG PO TABS
80.0000 mg | ORAL_TABLET | Freq: Every evening | ORAL | Status: DC
  Administered 2021-07-07 – 2021-07-08 (×2): 80 mg via ORAL
  Filled 2021-07-07 (×3): qty 2

## 2021-07-07 MED ORDER — SENNOSIDES-DOCUSATE SODIUM 8.6-50 MG PO TABS: 1.0000 | ORAL_TABLET | Freq: Every evening | ORAL | Status: DC | PRN

## 2021-07-07 MED ORDER — ACETAMINOPHEN 325 MG PO TABS
650.0000 mg | ORAL_TABLET | Freq: Four times a day (QID) | ORAL | Status: DC | PRN
  Administered 2021-07-07: 650 mg via ORAL
  Filled 2021-07-07: qty 2

## 2021-07-07 MED ORDER — SACUBITRIL-VALSARTAN 49-51 MG PO TABS
1.0000 | ORAL_TABLET | Freq: Two times a day (BID) | ORAL | Status: DC
  Administered 2021-07-07 – 2021-07-09 (×5): 1 via ORAL
  Filled 2021-07-07 (×6): qty 1

## 2021-07-07 MED ORDER — DIGOXIN 125 MCG PO TABS
0.0625 mg | ORAL_TABLET | Freq: Every day | ORAL | Status: DC
  Administered 2021-07-08 – 2021-07-09 (×2): 0.0625 mg via ORAL
  Filled 2021-07-07 (×2): qty 1

## 2021-07-07 MED ORDER — ISOSORBIDE MONONITRATE ER 30 MG PO TB24
30.0000 mg | ORAL_TABLET | Freq: Every day | ORAL | Status: DC
  Administered 2021-07-07 – 2021-07-09 (×3): 30 mg via ORAL
  Filled 2021-07-07 (×3): qty 1

## 2021-07-07 MED ORDER — ACETAMINOPHEN 650 MG RE SUPP: 650.0000 mg | Freq: Four times a day (QID) | RECTAL | Status: DC | PRN

## 2021-07-07 MED ORDER — IVABRADINE HCL 5 MG PO TABS
2.5000 mg | ORAL_TABLET | Freq: Two times a day (BID) | ORAL | Status: DC
  Administered 2021-07-07 – 2021-07-09 (×5): 2.5 mg via ORAL
  Filled 2021-07-07 (×6): qty 1

## 2021-07-07 MED ORDER — DIGOXIN 125 MCG PO TABS
0.1250 mg | ORAL_TABLET | Freq: Every day | ORAL | Status: DC
  Administered 2021-07-07: 0.125 mg via ORAL
  Filled 2021-07-07: qty 1

## 2021-07-07 MED ORDER — DIGOXIN 125 MCG PO TABS: 0.0625 mg | ORAL_TABLET | Freq: Every day | ORAL | 3 refills | Status: DC

## 2021-07-07 MED ORDER — FUROSEMIDE 10 MG/ML IJ SOLN
20.0000 mg | Freq: Every day | INTRAMUSCULAR | Status: DC
  Administered 2021-07-08: 20 mg via INTRAVENOUS
  Filled 2021-07-07: qty 2

## 2021-07-07 MED ORDER — SODIUM CHLORIDE 0.9% FLUSH
3.0000 mL | Freq: Two times a day (BID) | INTRAVENOUS | Status: DC
  Administered 2021-07-07 – 2021-07-09 (×5): 3 mL via INTRAVENOUS

## 2021-07-07 MED ORDER — APIXABAN 5 MG PO TABS
5.0000 mg | ORAL_TABLET | Freq: Two times a day (BID) | ORAL | Status: DC
  Administered 2021-07-07 – 2021-07-09 (×5): 5 mg via ORAL
  Filled 2021-07-07 (×5): qty 1

## 2021-07-07 MED ORDER — FUROSEMIDE 10 MG/ML IJ SOLN
40.0000 mg | Freq: Once | INTRAMUSCULAR | Status: AC
  Administered 2021-07-07: 40 mg via INTRAVENOUS
  Filled 2021-07-07: qty 4

## 2021-07-07 MED ORDER — HYDRALAZINE HCL 25 MG PO TABS
37.5000 mg | ORAL_TABLET | Freq: Three times a day (TID) | ORAL | Status: DC
  Administered 2021-07-07 – 2021-07-09 (×7): 37.5 mg via ORAL
  Filled 2021-07-07 (×8): qty 2

## 2021-07-07 MED ORDER — DAPAGLIFLOZIN PROPANEDIOL 10 MG PO TABS
10.0000 mg | ORAL_TABLET | Freq: Every day | ORAL | Status: DC
  Administered 2021-07-07 – 2021-07-09 (×3): 10 mg via ORAL
  Filled 2021-07-07 (×3): qty 1

## 2021-07-07 MED ORDER — ASPIRIN EC 81 MG PO TBEC
81.0000 mg | DELAYED_RELEASE_TABLET | Freq: Every day | ORAL | Status: DC
  Administered 2021-07-07 – 2021-07-09 (×3): 81 mg via ORAL
  Filled 2021-07-07 (×3): qty 1

## 2021-07-07 MED ORDER — MAGNESIUM OXIDE -MG SUPPLEMENT 400 (240 MG) MG PO TABS
400.0000 mg | ORAL_TABLET | Freq: Once | ORAL | Status: AC
  Administered 2021-07-07: 400 mg via ORAL
  Filled 2021-07-07: qty 1

## 2021-07-07 NOTE — Telephone Encounter (Signed)
-----   Message from Jolaine Artist, MD sent at 07/06/2021  8:52 PM EST ----- Please cut digoxin in half. Repeat level in 2 weeks

## 2021-07-07 NOTE — H&P (Signed)
History and Physical    Collin Young:[id number] DOB: 08-May-1951 DOA: 07/06/2021  PCP: Darreld Mclean, MD   Patient coming from: Home   Chief Complaint: SOB   HPI: Collin Young is a pleasant 70 y.o. male with medical history significant for chronic systolic CHF, PE on Eliquis, esophageal cancer status post chemoradiation, now presenting to the emergency department with shortness of breath.  Patient reports worsening shortness of breath over the past 2 days, becoming severe even while at rest last night.  He reports a mild nonproductive cough associated with this but denies any chest pain or leg swelling.  Denies fevers or chills.  Reports strict adherence with his medications.  Patient was hospitalized 4 weeks ago with cardiogenic shock and shock liver, found to have acute PE, and with treatment complicated by hyponatremia and acute kidney injury.  Hyponatremia improved during the admission with tolvaptan and free water restrictions and then normalized by time of discharge follow-up.  He was started on Eliquis during the recent admission and has been tolerating that well.  Acute kidney injury had resolved.  He followed up with cardiology on 07/04/2021, was found to have mildly elevated volume status and started on twice weekly Lasix at that time.  ED Course: Upon arrival to the ED, patient is found to be afebrile, saturating well on room air, tachypneic, and with stable blood pressure.  Blood work notable for sodium 132, troponin 45 and then 47, and BNP >4500.  He was given 40 mg IV Lasix in the ED and has been diuresing well.  Review of Systems:  All other systems reviewed and apart from HPI, are negative.  Past Medical History:  Diagnosis Date   Chronic combined systolic and diastolic CHF (congestive heart failure) ([place])    followed by [name]   COPD (chronic obstructive pulmonary disease) ([place])    Coronary artery disease cardiologist-- [name]   per  cardiac cath 01-05-2017  chronic total occlusion pLCx with collaterals, 99% severe calcified prox. to mid RCA, otherwise mild to moderate CAD (medically managed)   Esophageal cancer, stage IIIB [facility]) oncologist-  dr ennever/  dr moody   dx 2010  SCC Stage IIIB completed chemoradiation;  localized recurrent left piriform sinus 01/ 2015,  completed concurrent chemoradiatoin 04/ 2015   GERD (gastroesophageal reflux disease)    09-07-2018   no issues since Gtube removed 06/ 2019   History of alcohol abuse    quit 2001   History of cancer chemotherapy    2010;   2015   History of radiation therapy    10-19-2013 to  12-05-2013 pyriform sinus 69.96 Gy/65fx;   Radiation completed 2010 for esophageal cancer   History of seizure 2001   alcohol withdrawal   HOH (hard of hearing)    Hyperlipidemia    Hypertension    Ischemic cardiomyopathy 12/2016   01-03-2017  echo,  ef 10-15%/   echo 05-06-2017 EF improved to 45-50%   Prostate cancer [facility]) urologist-  dr ottelin/  oncologist-- [name]   dx 05-27-2018--- Stage T1c,  Gleason 4+3,  PSA 9.3--  scheduled for brachytherapy 09-23-2017   Renal cyst, left     Past Surgical History:  Procedure Laterality Date   CARDIOVASCULAR STRESS TEST  10/09/09   normal nuclearr stress test, EF 57% [name])   CENTRAL LINE INSERTION  06/16/2021   Procedure: CENTRAL LINE INSERTION;  Surgeon: Jolaine Artist, MD;  Location: [place] [facility];  Service:  Cardiovascular;;   IR GASTROSTOMY TUBE MOD SED  01/13/2017   IR GASTROSTOMY TUBE REMOVAL  02/03/2018   IR PATIENT EVAL TECH 0-60 MINS  03/25/2017   IR REMOVAL TUN ACCESS W/ PORT W/O FL MOD SED  01/15/2017   IR REPLACE G-TUBE SIMPLE WO FLUORO  01/13/2018   LARYNGOSCOPY N/A 09/15/2013   Procedure: LARYNGOSCOPY;  Surgeon: [name], MD;  Location: [place];  Service: ENT;  Laterality: N/A;  direct laryngoscopy with biopsy and esophagoscopy   RADIOACTIVE SEED IMPLANT N/A 09/23/2018   Procedure: RADIOACTIVE SEED  IMPLANT/BRACHYTHERAPY IMPLANT;  Surgeon: [name], MD;  Location: [place];  Service: Urology;  Laterality: N/A;   RIGHT HEART CATH N/A 06/16/2021   Procedure: RIGHT HEART CATH;  Surgeon: Jolaine Artist, MD;  Location: [place] [facility];  Service: Cardiovascular;  Laterality: N/A;   RIGHT/LEFT HEART CATH AND CORONARY ANGIOGRAPHY N/A 01/05/2017   Procedure: Right/Left Heart Cath and Coronary Angiography;  Surgeon: [name], MD;  Location: [place] [facility];  Service: Cardiovascular;  Laterality: N/A;   TRANSTHORACIC ECHOCARDIOGRAM  05/06/2017   ef 45-50%,  grade 1 diastolic dysfunction/  AV severe calcified non coronary cusp with moderate regurg. , no stenosis (valve area per echo 01-05-2017 1.08cm^2)/  mild MR, TR, and PR/  mild LAE    Social History:   reports that he quit smoking about 11 years ago. His smoking use included cigarettes. He started smoking about 60 years ago. He has a 40.00 pack-year smoking history. He quit smokeless tobacco use about 21 years ago.  His smokeless tobacco use included chew. He reports that he does not currently use alcohol. He reports that he does not use drugs.  No Known Allergies  Family History  Problem Relation Age of Onset   Breast cancer Mother    Prostate cancer Neg Hx    Colon cancer Neg Hx    Pancreatic cancer Neg Hx      Prior to Admission medications   Medication Sig Start Date End Date Taking? Authorizing Provider  albuterol (VENTOLIN HFA) 108 (90 Base) MCG/ACT inhaler INHALE 2 PUFFS INTO THE LUNGS EVERY 6 HOURS AS NEEDED FOR WHEEZING OR SHORTNESS OF BREATH 03/28/21   Copland, Gay Filler, MD  apixaban (ELIQUIS) 5 MG TABS tablet Take 1 tablet (5 mg total) by mouth 2 (two) times daily. 06/24/21 07/24/21  Darliss Cheney, MD  BAYER LOW DOSE 81 MG EC tablet Take 81 mg by mouth daily. Swallow whole.    [provider]  dapagliflozin propanediol (FARXIGA) 10 MG TABS tablet Take 1 tablet (10 mg total)  by mouth daily. 05/12/21   Georgette Shell, MD  digoxin (LANOXIN) 0.125 MG tablet Take 1 tablet (0.125 mg total) by mouth daily. 06/24/21 07/24/21  Darliss Cheney, MD  Evolocumab (REPATHA SURECLICK) 355 MG/ML SOAJ Inject 140 mg into the skin every 14 (fourteen) days. 11/14/20   Skeet Latch, MD  fentaNYL (DURAGESIC) 12 MCG/HR Place 1 patch onto the skin every 3 (three) days. Patient taking differently: Place 1 patch onto the skin as needed. 06/04/21   [name], MD  furosemide (LASIX) 20 MG tablet Take 1 tablet (20 mg total) by mouth 2 (two) times a week. On mondays and fridays 07/04/21 07/04/22  Bensimhon, Shaune Pascal, MD  hydrALAZINE (APRESOLINE) 25 MG tablet Take 1.5 tablets (37.5 mg total) by mouth every 8 (eight) hours. 06/24/21 07/24/21  Darliss Cheney, MD  ipratropium (ATROVENT) 0.03 % nasal spray Place 2 sprays into  both nostrils every 12 (twelve) hours. 07/03/21   Copland, Gay Filler, MD  isosorbide mononitrate (IMDUR) 30 MG 24 hr tablet Take 1 tablet (30 mg total) by mouth daily. 06/24/21 07/24/21  Darliss Cheney, MD  ivabradine (CORLANOR) 5 MG TABS tablet Take 1/2 tablet (2.5 mg total) by mouth 2 (two) times daily with a meal. 06/24/21 07/24/21  Darliss Cheney, MD  Multiple Vitamin (MULTIVITAMIN) tablet Take 1 tablet by mouth 3 (three) times a week. Patient not taking: Reported on 07/04/2021    [provider]  polyethylene glycol (MIRALAX / GLYCOLAX) 17 g packet Take 17 g by mouth daily as needed for mild constipation. Patient taking differently: Take 17 g by mouth daily as needed for mild constipation (MIX AND DRINK AS DIRECTED). 05/11/21   Georgette Shell, MD  pravastatin (PRAVACHOL) 80 MG tablet Take 1 tablet (80 mg total) by mouth every evening. 04/30/21 07/29/21  Skeet Latch, MD  sacubitril-valsartan (ENTRESTO) 49-51 MG Take 1 tablet by mouth 2 (two) times daily. 07/04/21   Bensimhon, Shaune Pascal, MD  spironolactone (ALDACTONE) 25 MG tablet Take 1/2 tablet (12.5 mg  total) by mouth daily. 06/24/21 07/24/21  Darliss Cheney, MD    Physical Exam: Vitals:   07/07/21 0315 07/07/21 0345 07/07/21 0415 07/07/21 0445  BP: (!) 149/93 (!) 152/90 (!) 151/90 (!) 159/91  Pulse: 94 97 97 98  Resp: 10 17 20  (!) 23  Temp:      TempSrc:      SpO2: 97% 96% 99% 99%  Weight:      Height:        Constitutional: NAD, calm  Eyes: PERTLA, lids and conjunctivae normal ENMT: Mucous membranes are moist. Posterior pharynx clear of any exudate.   Neck: supple, nontender   Respiratory: dyspneic with speech, mild tachypnea. No wheezing.  Cardiovascular: S1 & S2 heard, regular rate and rhythm. No extremity edema.  Abdomen: No distension, no tenderness, soft. Bowel sounds active.  Musculoskeletal: no clubbing / cyanosis. No joint deformity upper and lower extremities.   Skin: no significant rashes, lesions, ulcers. Warm, dry, well-perfused. Neurologic: CN 2-12 grossly intact. Moving all extremities. Alert and oriented.  Psychiatric: Very pleasant. Cooperative.    Labs and Imaging on Admission: I have personally reviewed following labs and imaging studies  CBC: Recent Labs  Lab 07/03/21 1058 07/04/21 1131 07/06/21 2354  WBC 4.6 4.9 5.3  NEUTROABS  --   --  3.5  HGB 11.3* 10.9* 11.7*  HCT 35.5* 36.0* 36.7*  MCV 84.8 89.3 84.2  PLT 289.0 283 812   Basic Metabolic Panel: Recent Labs  Lab 07/03/21 1058 07/04/21 1131 07/06/21 2354  NA 136 136 132*  K 4.2 4.2 4.7  CL 98 98 96*  CO2 32 30 26  GLUCOSE 86 109* 130*  BUN 19 14 26*  CREATININE 1.06 1.06 1.23  CALCIUM 9.2 9.1 9.3  MG  --  2.0  --    GFR: Estimated Creatinine Clearance: 49 mL/min (by C-G formula based on SCr of 1.23 mg/dL). Liver Function Tests: Recent Labs  Lab 07/03/21 1058 07/06/21 2354  AST 26 30  ALT 84* 56*  ALKPHOS 56 57  BILITOT 0.5 0.9  PROT 6.6 6.9  ALBUMIN 3.6 3.2*   No results for input(s): LIPASE, AMYLASE in the last 168 hours. No results for input(s): AMMONIA in the last  168 hours. Coagulation Profile: No results for input(s): INR, PROTIME in the last 168 hours. Cardiac Enzymes: No results for input(s): CKTOTAL, CKMB, CKMBINDEX, TROPONINI  in the last 168 hours. BNP (last 3 results) Recent Labs    05/14/21 0935  PROBNP 4,378.0*   HbA1C: No results for input(s): HGBA1C in the last 72 hours. CBG: No results for input(s): GLUCAP in the last 168 hours. Lipid Profile: No results for input(s): CHOL, HDL, LDLCALC, TRIG, CHOLHDL, LDLDIRECT in the last 72 hours. Thyroid Function Tests: No results for input(s): TSH, T4TOTAL, FREET4, T3FREE, THYROIDAB in the last 72 hours. Anemia Panel: No results for input(s): VITAMINB12, FOLATE, FERRITIN, TIBC, IRON, RETICCTPCT in the last 72 hours. Urine analysis:    Component Value Date/Time   COLORURINE YELLOW 05/07/2021 2345   APPEARANCEUR HAZY (A) 05/07/2021 2345   APPEARANCEUR Clear 05/29/2020 1556   LABSPEC 1.023 05/07/2021 2345   PHURINE 5.0 05/07/2021 2345   GLUCOSEU NEGATIVE 05/07/2021 2345   HGBUR NEGATIVE 05/07/2021 2345   BILIRUBINUR NEGATIVE 05/07/2021 2345   BILIRUBINUR Negative 05/29/2020 [place] 05/07/2021 2345   PROTEINUR 100 (A) 05/07/2021 2345   UROBILINOGEN 1.0 08/04/2014 2007   NITRITE NEGATIVE 05/07/2021 2345   LEUKOCYTESUR TRACE (A) 05/07/2021 2345   Sepsis Labs: @LABRCNTIP (procalcitonin:4,lacticidven:4) ) Recent Results (from the past 240 hour(s))  Resp Panel by RT-PCR (Flu A&B, Covid) Nasopharyngeal Swab     Status: None   Collection Time: 07/07/21  2:37 AM   Specimen: Nasopharyngeal Swab; Nasopharyngeal(NP) swabs in vial transport medium  Result Value Ref Range Status   SARS Coronavirus 2 by RT PCR NEGATIVE NEGATIVE Final    Comment: (NOTE) SARS-CoV-2 target nucleic acids are NOT DETECTED.  The SARS-CoV-2 RNA is generally detectable in upper respiratory specimens during the acute phase of infection. The lowest concentration of SARS-CoV-2 viral copies this assay  can detect is 138 copies/mL. A negative result does not preclude SARS-Cov-2 infection and should not be used as the sole basis for treatment or other patient management decisions. A negative result may occur with  improper specimen collection/handling, submission of specimen other than nasopharyngeal swab, presence of viral mutation(s) within the areas targeted by this assay, and inadequate number of viral copies(<138 copies/mL). A negative result must be combined with clinical observations, patient history, and epidemiological information. The expected result is Negative.  Fact Sheet for Patients:  EntrepreneurPulse.com.au  Fact Sheet for Healthcare Providers:  IncredibleEmployment.be  This test is no t yet approved or cleared by the [place] FDA and  has been authorized for detection and/or diagnosis of SARS-CoV-2 by FDA under an Emergency Use Authorization (EUA). This EUA will remain  in effect (meaning this test can be used) for the duration of the COVID-19 declaration under Section 564(b)(1) of the Act, 21 U.S.C.section 360bbb-3(b)(1), unless the authorization is terminated  or revoked sooner.       Influenza A by PCR NEGATIVE NEGATIVE Final   Influenza B by PCR NEGATIVE NEGATIVE Final    Comment: (NOTE) The Xpert Xpress SARS-CoV-2/FLU/RSV plus assay is intended as an aid in the diagnosis of influenza from Nasopharyngeal swab specimens and should not be used as a sole basis for treatment. Nasal washings and aspirates are unacceptable for Xpert Xpress SARS-CoV-2/FLU/RSV testing.  Fact Sheet for Patients: EntrepreneurPulse.com.au  Fact Sheet for Healthcare Providers: IncredibleEmployment.be  This test is not yet approved or cleared by the [place] FDA and has been authorized for detection and/or diagnosis of SARS-CoV-2 by FDA under an Emergency Use Authorization (EUA). This EUA will  remain in effect (meaning this test can be used) for the duration of the COVID-19 declaration under Section 564(b)(1)  of the Act, 21 U.S.C. section 360bbb-3(b)(1), unless the authorization is terminated or revoked.  Performed at [facility], [address], [place], [place] [address]      Radiological Exams on Admission: DG Chest 2 View  Result Date: 07/07/2021 CLINICAL DATA:  Shortness of breath.  CHF. EXAM: CHEST - 2 VIEW COMPARISON:  Radiograph 06/14/2021, additional priors, including chest CT 06/11/2021 FINDINGS: Bibasilar volume loss with bilateral pleural effusions and atelectasis. Cardiomegaly is similar. Aortic atherosclerosis. No convincing pulmonary edema. There is biapical pleuroparenchymal scarring. The bones are under mineralized unchanged mild midthoracic compression fractures. IMPRESSION: 1. Bibasilar volume loss with bilateral pleural effusions and atelectasis. Similar findings to prior exams. 2. Cardiomegaly. Electronically Signed   By: Keith Rake M.D.   On: 07/07/2021 00:25    EKG: Independently reviewed. Sinus rhythm, 1st degree AV block, RAD, incomplete RBBB, ST-T abnormality.   Assessment/Plan   1. Acute on chronic systolic CHF  - Presents with 2 days of worsening SOB and found to have JVD and BNP >4500  - EF was 20% on TTE last month  - He was given 40 mg IV Lasix in ED and has already put out 1.5 L  - Continue diuresis with 20 mg IV Lasix daily and adjust as needed (was only on 20 mg twice weekly at home), monitor wt and I/Os, continue Entresto, ivabradine, Farxiga, hydralazine, and digoxin, monitor electrolytes and renal function    2. Pulmonary embolism  - Diagnosed during admission last month - Continue Eliquis   3. Hyponatremia  - Serum sodium is 132 on admission in setting of hypervolemia  - Was as low as 125 during recent admission but improved with tolvaptan and normalized by time of discharge follow-up 07/03/21  - Monitor closely while  diuresing   4. COPD  - No wheezing on admission, continue as-needed albuterol     DVT prophylaxis: Eliquis   Code Status: limited, BiPAP and ACLS meds okay but no intubation, shocks, or chest compressions  Level of Care: Level of care: Telemetry Cardiac Family Communication: Daughter updated by phone  Disposition Plan:  Patient is from: Home  Anticipated d/c is to: Home  Anticipated d/c date is: 11/9 or 07/10/21 Patient currently: Pending improvement in respiratory status and stable labs  Consults called: none  Admission status: Inpatient     Vianne Bulls, MD [facility]  07/07/2021, 6:09 AM

## 2021-07-07 NOTE — ED Notes (Signed)
Pt c/o hand cramping/aches. Physician ordered magnesium. Pt also took tylenol to alleviate symptoms

## 2021-07-07 NOTE — ED Notes (Signed)
Pt has repeatedly desaturated to 86% on room air at rest with good pleth on monitor. RN applied 2LPM per Hickman.

## 2021-07-07 NOTE — Progress Notes (Signed)
Heart Failure Navigator Progress Note  Assessed for Heart & Vascular TOC clinic readiness.  Patient does not meet criteria due to prior to admission pt is established with AHF clinic and Dr. Haroldine Laws. Last appt 07/04/21.  Navigator available for reassessment of patient.   Pricilla Holm, MSN, RN Heart Failure Nurse Navigator [phone]

## 2021-07-07 NOTE — Progress Notes (Addendum)
  PROGRESS NOTE    Collin Young  ZPS:[id number] DOB: 15-Nov-1950 DOA: 07/06/2021  PCP: Darreld Mclean, MD    LOS - 0    Patient admitted after midnight with acute on chronic HFrEF (EF 20% last month).  Started on IV diuresis.  Interval subjective: Pt seen in the ED holding for a bed.  He reports his breathing has improved significantly.  Bedside RN reports pt noted to have O2 desaturations into the 70's, placed on 2 L/min  Exam: awake, alert, NAD Raspy/hoarse voice but normal speech Lungs with coarse crackles bilaterally, normal respiratory effort on 2 L/min [place] O2 Heart RRR, +JVP, no lower extremity edema    Principal Problem:   Acute on chronic systolic CHF (congestive heart failure) (HCC) Active Problems:   Pulmonary embolism (HCC)   COPD (chronic obstructive pulmonary disease) (HCC)   Hyponatremia    I have reviewed the full H&P by Dr. Myna Hidalgo in detail, and I agree with the assessment and plan as outlined therein. In addition:  Acute respiratory failure with hypoxia - Supplement O2 to keep sats >90%  Cardiology paged for consult.  No Charge    Ezekiel Slocumb, DO [facility]   If 7PM-7AM, please contact night-coverage [email or web address] 07/07/2021, 7:44 AM

## 2021-07-07 NOTE — ED Provider Notes (Signed)
MSE was initiated and I personally evaluated the patient and placed orders (if any) at  12:07 AM on July 07, 2021.  Patient with CHF, heart disease recent admission for CHF (d/ch 1 week ago) here with increasing SoB today without pain. No LE swelling. No fever, cough, congestion.  Today's Vitals   07/06/21 2345 07/06/21 2346  BP:  (!) 147/89  Pulse:  99  Resp:  19  Temp:  98.6 F (37 C)  TempSrc:  Oral  SpO2:  100%  Weight: 62 kg   Height: 5\' 6"  (1.676 m)   PainSc: 0-No pain    Body mass index is 22.06 kg/m.  Elderly, frail No difficulty breathing, no extra effort No LE edema   The patient appears stable so that the remainder of the MSE may be completed by another provider.   Charlann Lange, PA-C 07/07/21 0008    Ezequiel Essex, MD 07/07/21 [place]

## 2021-07-07 NOTE — ED Provider Notes (Signed)
[facility] EMERGENCY DEPARTMENT Provider Note   CSN: [id number] Arrival date & time: 07/06/21  2254     History Chief Complaint  Patient presents with   SOB / CHF    Collin Young is a 70 y.o. male.  Patient presents to the emergency department for evaluation of difficulty breathing.  Patient reports that he was recently hospitalized for congestive heart failure.  He reports that he has been taking all of his medications as prescribed and has been trying to limit his water intake to 20 ounces a day.  Patient has developed progressive shortness of breath.  He has not experiencing any chest pain, cough, fever.      Past Medical History:  Diagnosis Date   Chronic combined systolic and diastolic CHF (congestive heart failure) ([place])    followed by [name]   COPD (chronic obstructive pulmonary disease) ([place])    Coronary artery disease cardiologist-- [name]   per cardiac cath 01-05-2017  chronic total occlusion pLCx with collaterals, 99% severe calcified prox. to mid RCA, otherwise mild to moderate CAD (medically managed)   Esophageal cancer, stage IIIB [facility]) oncologist-  dr ennever/  dr moody   dx 2010  SCC Stage IIIB completed chemoradiation;  localized recurrent left piriform sinus 01/ 2015,  completed concurrent chemoradiatoin 04/ 2015   GERD (gastroesophageal reflux disease)    09-07-2018   no issues since Gtube removed 06/ 2019   History of alcohol abuse    quit 2001   History of cancer chemotherapy    2010;   2015   History of radiation therapy    10-19-2013 to  12-05-2013 pyriform sinus 69.96 Gy/3fx;   Radiation completed 2010 for esophageal cancer   History of seizure 2001   alcohol withdrawal   HOH (hard of hearing)    Hyperlipidemia    Hypertension    Ischemic cardiomyopathy 12/2016   01-03-2017  echo,  ef 10-15%/   echo 05-06-2017 EF improved to 45-50%   Prostate cancer [facility]) urologist-  dr ottelin/  oncologist-- [name]    dx 05-27-2018--- Stage T1c,  Gleason 4+3,  PSA 9.3--  scheduled for brachytherapy 09-23-2017   Renal cyst, left     Patient Active Problem List   Diagnosis Date Noted   SOB (shortness of breath)    DNR (do not resuscitate)    Weakness generalized    Elevated LFTs    Pulmonary embolism ([place]) 06/11/2021   Acute on chronic heart failure ([place]) 06/02/2021   GERD without esophagitis 05/08/2021   Mixed hyperlipidemia 05/08/2021   Hypertensive urgency 05/08/2021   Elevated troponin level not due myocardial infarction 05/08/2021   Pure hypercholesterolemia 09/23/2020   Claudication in peripheral vascular disease ([place]) 01/25/2020   Late latent syphilis 01/15/2020   Coronary artery disease involving native coronary artery of native heart 07/05/2019   Chronic kidney disease, stage 3a ([place]) 07/05/2019   Combined systolic and diastolic heart failure ([place]) 09/26/2018   Malignant neoplasm of prostate ([place]) 06/17/2018   Medication management    Dysphagia    Palliative care by specialist    Ischemic cardiomyopathy    Congestive dilated cardiomyopathy ([place])    Acute on chronic systolic congestive heart failure ([place]) 01/02/2017   Malignant neoplasm of pyriform sinus ([place]) 09/28/2013   Piriform sinus tumor 09/24/2013   NONSPECIFIC ABN FINDING RAD & OTH EXAM GI TRACT 05/14/2009    Past Surgical History:  Procedure Laterality Date   CARDIOVASCULAR STRESS TEST  10/09/09   normal nuclearr stress test, EF 57% [name])   CENTRAL LINE INSERTION  06/16/2021   Procedure: CENTRAL LINE INSERTION;  Surgeon: Jolaine Artist, MD;  Location: [place] [facility];  Service: Cardiovascular;;   IR GASTROSTOMY TUBE MOD SED  01/13/2017   IR GASTROSTOMY TUBE REMOVAL  02/03/2018   IR PATIENT EVAL TECH 0-60 MINS  03/25/2017   IR REMOVAL TUN ACCESS W/ PORT W/O FL MOD SED  01/15/2017   IR REPLACE G-TUBE SIMPLE WO FLUORO  01/13/2018   LARYNGOSCOPY N/A 09/15/2013   Procedure: LARYNGOSCOPY;  Surgeon: [name], MD;   Location: [place];  Service: ENT;  Laterality: N/A;  direct laryngoscopy with biopsy and esophagoscopy   RADIOACTIVE SEED IMPLANT N/A 09/23/2018   Procedure: RADIOACTIVE SEED IMPLANT/BRACHYTHERAPY IMPLANT;  Surgeon: [name], MD;  Location: [place];  Service: Urology;  Laterality: N/A;   RIGHT HEART CATH N/A 06/16/2021   Procedure: RIGHT HEART CATH;  Surgeon: Jolaine Artist, MD;  Location: [place] [facility];  Service: Cardiovascular;  Laterality: N/A;   RIGHT/LEFT HEART CATH AND CORONARY ANGIOGRAPHY N/A 01/05/2017   Procedure: Right/Left Heart Cath and Coronary Angiography;  Surgeon: [name], MD;  Location: [place] [facility];  Service: Cardiovascular;  Laterality: N/A;   TRANSTHORACIC ECHOCARDIOGRAM  05/06/2017   ef 45-50%,  grade 1 diastolic dysfunction/  AV severe calcified non coronary cusp with moderate regurg. , no stenosis (valve area per echo 01-05-2017 1.08cm^2)/  mild MR, TR, and PR/  mild LAE       Family History  Problem Relation Age of Onset   Breast cancer Mother    Prostate cancer Neg Hx    Colon cancer Neg Hx    Pancreatic cancer Neg Hx     Social History   Tobacco Use   Smoking status: Former    Packs/day: 1.00    Years: 40.00    Pack years: 40.00    Types: Cigarettes    Start date: 11/08/1960    Quit date: 08/31/2009    Years since quitting: 11.8   Smokeless tobacco: Former    Types: Chew    Quit date: 09/01/1999  Vaping Use   Vaping Use: Never used  Substance Use Topics   Alcohol use: Not Currently    Alcohol/week: 0.0 standard drinks    Comment: quit in 2001   Drug use: No    Comment: back in the day used cocaine,alcohol, marijuana    Home Medications Prior to Admission medications   Medication Sig Start Date End Date Taking? Authorizing Provider  albuterol (VENTOLIN HFA) 108 (90 Base) MCG/ACT inhaler INHALE 2 PUFFS INTO THE LUNGS EVERY 6 HOURS AS NEEDED FOR WHEEZING OR SHORTNESS OF BREATH 03/28/21   Copland,  Gay Filler, MD  apixaban (ELIQUIS) 5 MG TABS tablet Take 1 tablet (5 mg total) by mouth 2 (two) times daily. 06/24/21 07/24/21  Darliss Cheney, MD  BAYER LOW DOSE 81 MG EC tablet Take 81 mg by mouth daily. Swallow whole.    [provider]  dapagliflozin propanediol (FARXIGA) 10 MG TABS tablet Take 1 tablet (10 mg total) by mouth daily. 05/12/21   Georgette Shell, MD  digoxin (LANOXIN) 0.125 MG tablet Take 1 tablet (0.125 mg total) by mouth daily. 06/24/21 07/24/21  Darliss Cheney, MD  Evolocumab (REPATHA SURECLICK) 829 MG/ML SOAJ Inject 140 mg into the skin every 14 (fourteen) days. 11/14/20   Skeet Latch, MD  fentaNYL ([place]) 12 MCG/HR Place 1  patch onto the skin every 3 (three) days. Patient taking differently: Place 1 patch onto the skin as needed. 06/04/21   [name], MD  furosemide (LASIX) 20 MG tablet Take 1 tablet (20 mg total) by mouth 2 (two) times a week. On mondays and fridays 07/04/21 07/04/22  Bensimhon, Shaune Pascal, MD  hydrALAZINE (APRESOLINE) 25 MG tablet Take 1.5 tablets (37.5 mg total) by mouth every 8 (eight) hours. 06/24/21 07/24/21  Darliss Cheney, MD  ipratropium (ATROVENT) 0.03 % nasal spray Place 2 sprays into both nostrils every 12 (twelve) hours. 07/03/21   Copland, Gay Filler, MD  isosorbide mononitrate (IMDUR) 30 MG 24 hr tablet Take 1 tablet (30 mg total) by mouth daily. 06/24/21 07/24/21  Darliss Cheney, MD  ivabradine (CORLANOR) 5 MG TABS tablet Take 1/2 tablet (2.5 mg total) by mouth 2 (two) times daily with a meal. 06/24/21 07/24/21  Darliss Cheney, MD  Multiple Vitamin (MULTIVITAMIN) tablet Take 1 tablet by mouth 3 (three) times a week. Patient not taking: Reported on 07/04/2021    [provider]  polyethylene glycol (MIRALAX / GLYCOLAX) 17 g packet Take 17 g by mouth daily as needed for mild constipation. Patient taking differently: Take 17 g by mouth daily as needed for mild constipation (MIX AND DRINK AS DIRECTED). 05/11/21   Georgette Shell, MD  pravastatin (PRAVACHOL) 80 MG tablet Take 1 tablet (80 mg total) by mouth every evening. 04/30/21 07/29/21  Skeet Latch, MD  sacubitril-valsartan (ENTRESTO) 49-51 MG Take 1 tablet by mouth 2 (two) times daily. 07/04/21   Bensimhon, Shaune Pascal, MD  spironolactone (ALDACTONE) 25 MG tablet Take 1/2 tablet (12.5 mg total) by mouth daily. 06/24/21 07/24/21  Darliss Cheney, MD    Allergies    Patient has no known allergies.  Review of Systems   Review of Systems  Respiratory:  Positive for shortness of breath.   All other systems reviewed and are negative.  Physical Exam Updated Vital Signs BP (!) 170/109 (BP Location: Left Arm)   Pulse 96   Temp 98.3 F (36.8 C) (Oral)   Resp 19   Ht 5\' 6"  (1.676 m)   Wt 62 kg   SpO2 100%   BMI 22.06 kg/m   Physical Exam Vitals and nursing note reviewed.  Constitutional:      General: He is not in acute distress.    Appearance: Normal appearance. He is well-developed.  HENT:     Head: Normocephalic and atraumatic.     Right Ear: Hearing normal.     Left Ear: Hearing normal.     Nose: Nose normal.  Eyes:     Conjunctiva/sclera: Conjunctivae normal.     Pupils: Pupils are equal, round, and reactive to light.  Cardiovascular:     Rate and Rhythm: Regular rhythm.     Heart sounds: S1 normal and S2 normal. No murmur heard.   No friction rub. No gallop.  Pulmonary:     Effort: Pulmonary effort is normal. No respiratory distress.     Breath sounds: Rales present.  Chest:     Chest wall: No tenderness.  Abdominal:     General: Bowel sounds are normal.     Palpations: Abdomen is soft.     Tenderness: There is no abdominal tenderness. There is no guarding or rebound. Negative signs include Murphy's sign and McBurney's sign.     Hernia: No hernia is present.  Musculoskeletal:        General: Normal range of motion.  Cervical back: Normal range of motion and neck supple.     Right lower leg: 1+ Pitting Edema present.      Left lower leg: 1+ Pitting Edema present.  Skin:    General: Skin is warm and dry.     Findings: No rash.  Neurological:     Mental Status: He is alert and oriented to person, place, and time.     GCS: GCS eye subscore is 4. GCS verbal subscore is 5. GCS motor subscore is 6.     Cranial Nerves: No cranial nerve deficit.     Sensory: No sensory deficit.     Coordination: Coordination normal.  Psychiatric:        Speech: Speech normal.        Behavior: Behavior normal.        Thought Content: Thought content normal.    ED Results / Procedures / Treatments   Labs (all labs ordered are listed, but only abnormal results are displayed) Labs Reviewed  CBC WITH DIFFERENTIAL/PLATELET - Abnormal; Notable for the following components:      Result Value   Hemoglobin 11.7 (*)    HCT 36.7 (*)    RDW 16.0 (*)    All other components within normal limits  BRAIN NATRIURETIC PEPTIDE - Abnormal; Notable for the following components:   B Natriuretic Peptide >4,500.0 (*)    All other components within normal limits  COMPREHENSIVE METABOLIC PANEL - Abnormal; Notable for the following components:   Sodium 132 (*)    Chloride 96 (*)    Glucose, Bld 130 (*)    BUN 26 (*)    Albumin 3.2 (*)    ALT 56 (*)    All other components within normal limits  DIGOXIN LEVEL - Abnormal; Notable for the following components:   Digoxin Level 0.7 (*)    All other components within normal limits  TROPONIN I (HIGH SENSITIVITY) - Abnormal; Notable for the following components:   Troponin I (High Sensitivity) 45 (*)    All other components within normal limits  TROPONIN I (HIGH SENSITIVITY)    EKG EKG Interpretation  Date/Time:  Sunday July 06 2021 23:34:27 EST Ventricular Rate:  99 PR Interval:  210 QRS Duration: 94 QT Interval:  332 QTC Calculation: 426 R Axis:   144 Text Interpretation: Sinus rhythm with 1st degree A-V block Right axis deviation Incomplete right bundle branch block Possible Right  ventricular hypertrophy Septal infarct , age undetermined ST & T wave abnormality, consider lateral ischemia Abnormal ECG No significant change since last tracing Confirmed by Orpah Greek [phone]) on 07/07/2021 1:49:36 AM  Radiology DG Chest 2 View  Result Date: 07/07/2021 CLINICAL DATA:  Shortness of breath.  CHF. EXAM: CHEST - 2 VIEW COMPARISON:  Radiograph 06/14/2021, additional priors, including chest CT 06/11/2021 FINDINGS: Bibasilar volume loss with bilateral pleural effusions and atelectasis. Cardiomegaly is similar. Aortic atherosclerosis. No convincing pulmonary edema. There is biapical pleuroparenchymal scarring. The bones are under mineralized unchanged mild midthoracic compression fractures. IMPRESSION: 1. Bibasilar volume loss with bilateral pleural effusions and atelectasis. Similar findings to prior exams. 2. Cardiomegaly. Electronically Signed   By: Keith Rake M.D.   On: 07/07/2021 00:25    Procedures Procedures   Medications Ordered in ED Medications - No data to display  ED Course  I have reviewed the triage vital signs and the nursing notes.  Pertinent labs & imaging results that were available during my care of the patient were reviewed by me and  considered in my medical decision making (see chart for details).    MDM Rules/Calculators/A&P                           Patient with combined systolic and diastolic congestive heart failure, recent echo with ejection fraction of 20%, presents to the ER for evaluation of progressively worsening shortness of breath.  Patient appears volume overloaded.  He does have bilateral lower extremity pitting edema.  Patient with decreased air movement and rales on exam.  Chest x-ray with bilateral pleural effusions and pulmonary edema.  BNP greater than 4500.  Presentation similar to prior presentations for congestive heart failure.  Previous hospitalization required 12 days to get him stabilized.  I do not feel like he is a  candidate for diuresis and discharge at this time.  Final Clinical Impression(s) / ED Diagnoses Final diagnoses:  Acute on chronic combined systolic and diastolic congestive heart failure [facility])    Rx / DC Orders ED Discharge Orders     None        Reeshemah Nazaryan, Gwenyth Allegra, MD 07/07/21 [phone]

## 2021-07-07 NOTE — Telephone Encounter (Signed)
Patients daughter advised and verbalized understanding. Med list updated to reflect changes.   Meds ordered this encounter  Medications   digoxin (LANOXIN) 0.125 MG tablet    Sig: Take 0.5 tablets (0.0625 mg total) by mouth daily.    Dispense:  45 tablet    Refill:  3    Please cancel all previous orders for current medication. Change in dosage or pill size.  Marland Kitchen

## 2021-07-08 ENCOUNTER — Inpatient Hospital Stay: Payer: Self-pay

## 2021-07-08 DIAGNOSIS — I5023 Acute on chronic systolic (congestive) heart failure: Secondary | ICD-10-CM | POA: Diagnosis not present

## 2021-07-08 LAB — BASIC METABOLIC PANEL
Anion gap: 10 (ref 5–15)
BUN: 18 mg/dL (ref 8–23)
CO2: 27 mmol/L (ref 22–32)
Calcium: 8.6 mg/dL — ABNORMAL LOW (ref 8.9–10.3)
Chloride: 94 mmol/L — ABNORMAL LOW (ref 98–111)
Creatinine, Ser: 1.08 mg/dL (ref 0.61–1.24)
GFR, Estimated: >60 mL/min (ref >60)
Glucose, Bld: 82 mg/dL (ref 70–99)
Potassium: 4 mmol/L (ref 3.5–5.1)
Sodium: 131 mmol/L — ABNORMAL LOW (ref 135–145)

## 2021-07-08 LAB — LACTIC ACID, PLASMA
Lactic Acid, Venous: 2 mmol/L (ref 0.5–1.9)
Lactic Acid, Venous: 1.5 mmol/L (ref 0.5–1.9)

## 2021-07-08 LAB — CBC
HCT: 35.2 % — ABNORMAL LOW (ref 39.0–52.0)
Hemoglobin: 11.1 g/dL — ABNORMAL LOW (ref 13.0–17.0)
MCH: 26.2 pg (ref 26.0–34.0)
MCHC: 31.5 g/dL (ref 30.0–36.0)
MCV: 83 fL (ref 80.0–100.0)
Platelets: 311 10*3/uL (ref 150–400)
RBC: 4.24 MIL/uL (ref 4.22–5.81)
RDW: 15.9 % — ABNORMAL HIGH (ref 11.5–15.5)
WBC: 4.5 10*3/uL (ref 4.0–10.5)
nRBC: 0 % (ref 0.0–0.2)

## 2021-07-08 LAB — MAGNESIUM: Magnesium: 2 mg/dL (ref 1.7–2.4)

## 2021-07-08 MED ORDER — FUROSEMIDE 10 MG/ML IJ SOLN
60.0000 mg | Freq: Two times a day (BID) | INTRAMUSCULAR | Status: DC
  Administered 2021-07-08 – 2021-07-09 (×2): 60 mg via INTRAVENOUS
  Filled 2021-07-08 (×2): qty 6

## 2021-07-08 MED ORDER — SPIRONOLACTONE 25 MG PO TABS
25.0000 mg | ORAL_TABLET | Freq: Every day | ORAL | Status: DC
  Administered 2021-07-08 – 2021-07-09 (×2): 25 mg via ORAL
  Filled 2021-07-08 (×2): qty 1

## 2021-07-08 NOTE — Progress Notes (Signed)
Patient called out and said he was coughing up blood. RN assessed the patient and patient shows no sign of distress, no signs of bleeding and has relayed to RN that this is the first time he coughed up a bloody mucus plug. Patient has had productive cough with thin, clear/tan mucus plugs on admission that has been going on for weeks. One moderate size bloody mucus plug located in emesis bin. MD is on the floor and has been made aware.

## 2021-07-08 NOTE — Plan of Care (Signed)
  Problem: Education: Goal: Ability to demonstrate management of disease process will improve Outcome: Progressing Goal: Ability to verbalize understanding of medication therapies will improve Outcome: Progressing   

## 2021-07-08 NOTE — Progress Notes (Signed)
PROGRESS NOTE    Collin Young   GEZ:[id number]  DOB: 04/30/1951  PCP: Darreld Mclean, MD    DOA: 07/06/2021 LOS: 1    Brief Narrative / Hospital Course to Date:   Collin Young is a pleasant 70 y.o. male with medical history significant for chronic systolic CHF (EF < 65%), PE on Eliquis, esophageal cancer status post chemoradiation, presented to the ED on evening of 07/06/21 with worsening shortness of breath x 2 days. Also reported a mild nonproductive cough, no chest pain or leg swelling, no fevers or chills.  Reported strict adherence with his medications.   Of note, pt admitted 4 weeks ago with cardiogenic shock and shock liver, found to have acute PE and was started on Eliquis.  Hospital course was complicated by AKI and hyponatremia which improved with tolvaptan and fluid restriction and had normalized by time of discharge follow-up. He followed up with cardiology on 07/04/2021, was found to have mildly elevated volume status and started on twice weekly Lasix at that time.  Chest xray showed bibasilar volume loss with bilateral pleural effusions and atelectasis. Similar findings to prior exams. Cardiomegaly.  BNP was > 4500.  Admitted to [facility] service and started on IV diuresis with improving symptoms. Heart failure team consulted.   Assessment & Plan   Principal Problem:   Acute on chronic systolic CHF (congestive heart failure) (HCC) Active Problems:   Pulmonary embolism (HCC)   COPD (chronic obstructive pulmonary disease) (HCC)   Hyponatremia   Acute on chronic systolic CHF -EF less than 20%.  Patient presented with 2 days worsening shortness of breath, cough, JVD on exam and BNP > 4500. Responding well to IV Lasix.  Put out 1.5 L urine after 40 mg given in the ED. -- Continuing on IV Lasix 20 mg daily  (Appears home Lasix dose is 20 mg twice weekly) -- Strict I/O's and daily weights --Continue Entresto, ivabradine, Farxiga, hydralazine, digoxin --Monitor renal  function and electrolytes --Echo ordered as it appears 1 was planned for 11/4 not yet done --Heart failure team consulted, appreciate recommendations  Pulmonary embolism -diagnosed during admission last month. --Continue Eliquis  Hyponatremia -mild, present with sodium 132 in the setting of hypervolemia.  During recent admission, sodium was low at 125 but improved after tolvaptan and fluid restriction. --Follow BMP closely  COPD -not acutely exacerbated, no wheezing on exam --Continue as needed albuterol  Patient BMI: Body mass index is 20.64 kg/m.   DVT prophylaxis:  apixaban (ELIQUIS) tablet 5 mg   Diet:  Diet Orders (From admission, onward)     Start     Ordered   07/07/21 0606  Diet Heart Room service appropriate? Yes; Fluid consistency: Thin  Diet effective now       Question Answer Comment  Room service appropriate? Yes   Fluid consistency: Thin      07/07/21 0606              Code Status: Partial Code - BiPAP and ACLS meds okay but no intubation, shocks, or chest compressions   Subjective 07/08/21    Patient up in recliner when seen this morning.  He had recently coughed up a mucous plug that was bloody.  He reported this was a single incident and other phlegm has been clear or white.  Denies fevers or chills or chest pain.  Says he feels like he is breathing better.  Reports he ambulated in the hallway earlier and did well.  No other  acute complaints.   Disposition Plan & Communication   Status is: Inpatient  Remains inpatient appropriate because: Remains on IV diuresis   Consults, Procedures, Significant Events   Consultants:  Heart failure team  Procedures:  Echo pending  Antimicrobials:  Anti-infectives (From admission, onward)    None         Micro    Objective   Vitals:   07/08/21 0741 07/08/21 0859 07/08/21 1149 07/08/21 1344  BP: 123/77 134/71 121/72 (!) 102/54  Pulse: 88 89 87 91  Resp: 16  17   Temp: 98 F (36.7 C)  98.1 F  (36.7 C)   TempSrc: Oral  Oral   SpO2: 98%  100%   Weight:      Height:        Intake/Output Summary (Last 24 hours) at 07/08/2021 1550 Last data filed at 07/08/2021 1147 Gross per 24 hour  Intake 360 ml  Output 1450 ml  Net -1090 ml   Filed Weights   07/06/21 2345 07/08/21 0400  Weight: 62 kg 58 kg    Physical Exam:  General exam: awake, alert, no acute distress HEENT: atraumatic, clear conjunctiva, anicteric sclera, moist mucus membranes, hearing grossly normal  Respiratory system: Diminished bases but overall clear bilaterally, no wheezes or rhonchi, normal respiratory effort.  On 2 L/min nasal cannula O2. Cardiovascular system: normal S1/S2, RRR, +JVD, no peripheral edema.   Central nervous system: A&O x3. no gross focal neurologic deficits, normal speech but raspy hoarse voice Extremities: moves all, no edema, normal tone Skin: dry, intact, normal temperature, No rashes, lesions or ulcers seen on visualized skin Psychiatry: normal mood, congruent affect, judgement and insight appear normal  Labs   Data Reviewed: I have personally reviewed following labs and imaging studies  CBC: Recent Labs  Lab 07/03/21 1058 07/04/21 1131 07/06/21 2354 07/08/21 0409  WBC 4.6 4.9 5.3 4.5  NEUTROABS  --   --  3.5  --   HGB 11.3* 10.9* 11.7* 11.1*  HCT 35.5* 36.0* 36.7* 35.2*  MCV 84.8 89.3 84.2 83.0  PLT 289.0 283 310 916   Basic Metabolic Panel: Recent Labs  Lab 07/03/21 1058 07/04/21 1131 07/06/21 2354 07/07/21 2110 07/08/21 0409  NA 136 136 132* 131* 131*  K 4.2 4.2 4.7 4.1 4.0  CL 98 98 96* 93* 94*  CO2 32 30 26 31 27   GLUCOSE 86 109* 130* 99 82  BUN 19 14 26* 22 18  CREATININE 1.06 1.06 1.23 1.24 1.08  CALCIUM 9.2 9.1 9.3 8.5* 8.6*  MG  --  2.0  --  2.1 2.0   GFR: Estimated Creatinine Clearance: 52.2 mL/min (by C-G formula based on SCr of 1.08 mg/dL). Liver Function Tests: Recent Labs  Lab 07/03/21 1058 07/06/21 2354  AST 26 30  ALT 84* 56*  ALKPHOS 56  57  BILITOT 0.5 0.9  PROT 6.6 6.9  ALBUMIN 3.6 3.2*   No results for input(s): LIPASE, AMYLASE in the last 168 hours. No results for input(s): AMMONIA in the last 168 hours. Coagulation Profile: No results for input(s): INR, PROTIME in the last 168 hours. Cardiac Enzymes: No results for input(s): CKTOTAL, CKMB, CKMBINDEX, TROPONINI in the last 168 hours. BNP (last 3 results) Recent Labs    05/14/21 0935  PROBNP 4,378.0*   HbA1C: No results for input(s): HGBA1C in the last 72 hours. CBG: No results for input(s): GLUCAP in the last 168 hours. Lipid Profile: No results for input(s): CHOL, HDL, LDLCALC, TRIG, CHOLHDL, LDLDIRECT in  the last 72 hours. Thyroid Function Tests: No results for input(s): TSH, T4TOTAL, FREET4, T3FREE, THYROIDAB in the last 72 hours. Anemia Panel: No results for input(s): VITAMINB12, FOLATE, FERRITIN, TIBC, IRON, RETICCTPCT in the last 72 hours. Sepsis Labs: No results for input(s): PROCALCITON, LATICACIDVEN in the last 168 hours.  Recent Results (from the past 240 hour(s))  Resp Panel by RT-PCR (Flu A&B, Covid) Nasopharyngeal Swab     Status: None   Collection Time: 07/07/21  2:37 AM   Specimen: Nasopharyngeal Swab; Nasopharyngeal(NP) swabs in vial transport medium  Result Value Ref Range Status   SARS Coronavirus 2 by RT PCR NEGATIVE NEGATIVE Final    Comment: (NOTE) SARS-CoV-2 target nucleic acids are NOT DETECTED.  The SARS-CoV-2 RNA is generally detectable in upper respiratory specimens during the acute phase of infection. The lowest concentration of SARS-CoV-2 viral copies this assay can detect is 138 copies/mL. A negative result does not preclude SARS-Cov-2 infection and should not be used as the sole basis for treatment or other patient management decisions. A negative result may occur with  improper specimen collection/handling, submission of specimen other than nasopharyngeal swab, presence of viral mutation(s) within the areas targeted by  this assay, and inadequate number of viral copies(<138 copies/mL). A negative result must be combined with clinical observations, patient history, and epidemiological information. The expected result is Negative.  Fact Sheet for Patients:  EntrepreneurPulse.com.au  Fact Sheet for Healthcare Providers:  IncredibleEmployment.be  This test is no t yet approved or cleared by the [place] FDA and  has been authorized for detection and/or diagnosis of SARS-CoV-2 by FDA under an Emergency Use Authorization (EUA). This EUA will remain  in effect (meaning this test can be used) for the duration of the COVID-19 declaration under Section 564(b)(1) of the Act, 21 U.S.C.section 360bbb-3(b)(1), unless the authorization is terminated  or revoked sooner.       Influenza A by PCR NEGATIVE NEGATIVE Final   Influenza B by PCR NEGATIVE NEGATIVE Final    Comment: (NOTE) The Xpert Xpress SARS-CoV-2/FLU/RSV plus assay is intended as an aid in the diagnosis of influenza from Nasopharyngeal swab specimens and should not be used as a sole basis for treatment. Nasal washings and aspirates are unacceptable for Xpert Xpress SARS-CoV-2/FLU/RSV testing.  Fact Sheet for Patients: EntrepreneurPulse.com.au  Fact Sheet for Healthcare Providers: IncredibleEmployment.be  This test is not yet approved or cleared by the [place] FDA and has been authorized for detection and/or diagnosis of SARS-CoV-2 by FDA under an Emergency Use Authorization (EUA). This EUA will remain in effect (meaning this test can be used) for the duration of the COVID-19 declaration under Section 564(b)(1) of the Act, 21 U.S.C. section 360bbb-3(b)(1), unless the authorization is terminated or revoked.  Performed at [facility], [place] [address] [place],  [address]       Imaging Studies   DG Chest 2 View  Result Date: 07/07/2021 CLINICAL  DATA:  Shortness of breath.  CHF. EXAM: CHEST - 2 VIEW COMPARISON:  Radiograph 06/14/2021, additional priors, including chest CT 06/11/2021 FINDINGS: Bibasilar volume loss with bilateral pleural effusions and atelectasis. Cardiomegaly is similar. Aortic atherosclerosis. No convincing pulmonary edema. There is biapical pleuroparenchymal scarring. The bones are under mineralized unchanged mild midthoracic compression fractures. IMPRESSION: 1. Bibasilar volume loss with bilateral pleural effusions and atelectasis. Similar findings to prior exams. 2. Cardiomegaly. Electronically Signed   By: Keith Rake M.D.   On: 07/07/2021 00:25     Medications   Scheduled  Meds:  apixaban  5 mg Oral BID   aspirin EC  81 mg Oral Daily   dapagliflozin propanediol  10 mg Oral Daily   digoxin  0.0625 mg Oral Daily   furosemide  20 mg Intravenous Daily   hydrALAZINE  37.5 mg Oral Q8H   isosorbide mononitrate  30 mg Oral Daily   ivabradine  2.5 mg Oral BID WC   pravastatin  80 mg Oral QPM   sacubitril-valsartan  1 tablet Oral BID   sodium chloride flush  3 mL Intravenous Q12H   Continuous Infusions:     LOS: 1 day    Time spent: 30 minutes    Ezekiel Slocumb, DO [facility]  07/08/2021, 3:50 PM      If 7PM-7AM, please contact night-coverage. How to contact the [facility] Attending or Consulting provider [place] or covering provider during after hours [facility], for this patient?    Check the care team in [facility] and look for a) attending/consulting [facility] provider listed and b) the [facility] team listed Log into [email or web address] and use [employer]'s universal password to access. If you do not have the password, please contact the hospital operator. Locate the [facility] provider you are looking for under [facility] and page to a number that you can be directly reached. If you still have difficulty reaching the provider, please page the [facility] (Director on Call) for the Hospitalists listed on amion for  assistance.

## 2021-07-08 NOTE — Plan of Care (Signed)

## 2021-07-08 NOTE — Consult Note (Addendum)
Advanced Heart Failure Team Consult Note   Primary Physician: Darreld Michie Molnar, MD PCP-Cardiologist:  Skeet Latch, MD [facility]: Dr. Haroldine Laws   Reason for Consultation: Acute on Chronic Systolic Heart Failure   HPI:    Collin Young is seen today for evaluation of acute on chronic systolic heart failure at the request of Dr. Arbutus Ped, Internal Medicine.   Collin Young is a 70 y.o.male w/ chronic combined systolic and diastolic CHF/ ischemic CM, severe multivessel CAD treated medically, 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     HF dates back to 12/2016. Echo then w/ severely reduced LVEF 10-15%, RV mild-mod reduced, mod AI/MR/TR. LHC showed severe calcific disease in the proximal to mid RCA and chronic total occlusion of the proximal circumflex.There was consideration of RCA PCI.  However, this would have required hemodynamic support and was deferred given his frailty and comorbidities. CAD managed medically.    Repeat Echo 05/2017, EF improved up to 45-50%, Mod AI. RV normal.     Echo 05/2020 EF down again, 35-40%. RV normal    Admitted to [facility] 9/22 w/ a/c CHF. Echo EF down 25-30%, RV not well visualized. Mod MR/AI. Chest CT showed  large right pleural effusion.  There was questionable PE on his CT but ultimately this was thought to be just artifact with poor opacification. Given w/ IV Lasix and discharged home on Entresto, Farxiga, Toprol XL and PRN lasix.    Presented back to [facility] ED on 10/12 via EMS w/ acute SOB and hypoxic respiratory failure. Repeat chest CT showed rt lower lobe PE + large rt pleural effusion and small left pleural effusion. Underwent Rt Thoracentesis, fluid analysis c/w transudative effusion. Heparin started for PE, eventually switched to Eliquis. Cardiology consulted for a/c CHF. Started on IV Lasix for diuresis. Limited Echo repeated, EF 20%, global HK, RV normal. Developed  signs of low output and transferred to [facility] for RHC, which showed cardiogenic shock with R>L heart failure. Milrinone started. GDMT titrated and able to wean milrinone off. Not felt to be a VAD candidate due to cachexia and RV failure. Discharged home, weight 121 lbs.  Had post hospital f/u w/ Dr. Haroldine Laws in the [facility] 07/04/21. Doing ok w/ NYHA Class II symptoms. Wt was up 4 lb post hospital and ReDs clip was elevated at 36%. Entresto was increased to 49-51 bid and Lasix was added 2 days a week, qMon + qFri. Digoxin was also reduced to 0.0625 due to elevated level (1.0).  He presented to the ED overnight w/ complaint of recurrent dyspnea and found to be in acute on chronic CHF. BNP >4,500. COVID negative. Afebrile. WBC normal 5.3. Hgb 11.7 (stable). Hs trop 45>>47. Na 132, K 4.7, SCr 1.23 (baseline 0.8-1.0), CO2 26, AST 30, ALT 56. Digoxin level down to 0.7. EKG NSR 99 pbm w/ 1st degree AVB and incomplete RBBB. Admitted and started on IV Lasix. Repeat echo ordered. Not completed yet.   He had 1.9L in UOP overnight. Remains fluid overloaded. Wt still up 6 lb above recent dry wt at 127 lb. SCr improved today, 1.23>>1.08. K 4.0, CO2 27.  He admits to dietary indiscretion w/ sodium. Lives by himself and often eats out. On Friday night he had a [place] cheese steak sandwich and Saturday night ate fried seafood from a restaurant. Dyspnea started Sunday evening. No relief w/ inhalers. He has been compliant w/ all of his  meds including Eliquis.    Echo 10/22 EF 20%, global HK, RV mildly reduced Repeat Echo pending   Review of Systems: [y] = yes, [ ]  = no   General: Weight gain [ Y]; Weight loss [ ] ; Anorexia [ ] ; Fatigue [ ] ; Fever [ ] ; Chills [ ] ; Weakness [ ]   Cardiac: Chest pain/pressure [ ] ; Resting SOB [ Y]; Exertional SOB [ Y]; Orthopnea [ Y]; Pedal Edema [ ] ; Palpitations [ ] ; Syncope [ ] ; Presyncope [ ] ; Paroxysmal nocturnal dyspnea[ ]   Pulmonary: Cough [ ] ; Wheezing[ ] ; Hemoptysis[ ] ; Sputum [ ] ;  Snoring [ ]   GI: Vomiting[ ] ; Dysphagia[ ] ; Melena[ ] ; Hematochezia [ ] ; Heartburn[ ] ; Abdominal pain [ ] ; Constipation [ ] ; Diarrhea [ ] ; BRBPR [ ]   GU: Hematuria[ ] ; Dysuria [ ] ; Nocturia[ ]   Vascular: Pain in legs with walking [ ] ; Pain in feet with lying flat [ ] ; Non-healing sores [ ] ; Stroke [ ] ; TIA [ ] ; Slurred speech [ ] ;  Neuro: Headaches[ ] ; Vertigo[ ] ; Seizures[ ] ; Paresthesias[ ] ;Blurred vision [ ] ; Diplopia [ ] ; Vision changes [ ]   Ortho/Skin: Arthritis [ ] ; Joint pain [ ] ; Muscle pain [ ] ; Joint swelling [ ] ; Back Pain [ ] ; Rash [ ]   Psych: Depression[ ] ; Anxiety[ ]   Heme: Bleeding problems [ ] ; Clotting disorders [ ] ; Anemia [ ]   Endocrine: Diabetes [ ] ; Thyroid dysfunction[ ]   Home Medications Prior to Admission medications   Medication Sig Start Date End Date Taking? Authorizing Provider  albuterol (VENTOLIN HFA) 108 (90 Base) MCG/ACT inhaler INHALE 2 PUFFS INTO THE LUNGS EVERY 6 HOURS AS NEEDED FOR WHEEZING OR SHORTNESS OF BREATH Patient taking differently: Inhale 2 puffs into the lungs every 6 (six) hours as needed for wheezing or shortness of breath. 03/28/21  Yes Copland, Gay Filler, MD  apixaban (ELIQUIS) 5 MG TABS tablet Take 1 tablet (5 mg total) by mouth 2 (two) times daily. 06/24/21 07/24/21 Yes Pahwani, Einar Grad, MD  BAYER LOW DOSE 81 MG EC tablet Take 81 mg by mouth daily. Swallow whole.   Yes [provider]  dapagliflozin propanediol (FARXIGA) 10 MG TABS tablet Take 1 tablet (10 mg total) by mouth daily. 05/12/21  Yes Georgette Shell, MD  Evolocumab (REPATHA SURECLICK) 951 MG/ML SOAJ Inject 140 mg into the skin every 14 (fourteen) days. 11/14/20  Yes Skeet Latch, MD  fentaNYL (DURAGESIC) 12 MCG/HR Place 1 patch onto the skin every 3 (three) days. 06/04/21  Yes [name], MD  hydrALAZINE (APRESOLINE) 25 MG tablet Take 1.5 tablets (37.5 mg total) by mouth every 8 (eight) hours. 06/24/21 07/24/21 Yes Pahwani, Einar Grad, MD  isosorbide mononitrate (IMDUR)  30 MG 24 hr tablet Take 1 tablet (30 mg total) by mouth daily. 06/24/21 07/24/21 Yes Pahwani, Einar Grad, MD  ivabradine (CORLANOR) 5 MG TABS tablet Take 1/2 tablet (2.5 mg total) by mouth 2 (two) times daily with a meal. 06/24/21 07/24/21 Yes Pahwani, Einar Grad, MD  Multiple Vitamin (MULTIVITAMIN) tablet Take 1 tablet by mouth 3 (three) times a week.   Yes [provider]  polyethylene glycol (MIRALAX / GLYCOLAX) 17 g packet Take 17 g by mouth daily as needed for mild constipation. Patient taking differently: Take 17 g by mouth daily as needed for mild constipation (MIX AND DRINK AS DIRECTED). 05/11/21  Yes Georgette Shell, MD  pravastatin (PRAVACHOL) 80 MG tablet Take 1 tablet (80 mg total) by mouth every evening. 04/30/21 07/29/21 Yes Skeet Latch, MD  sacubitril-valsartan [facility])  49-51 MG Take 1 tablet by mouth 2 (two) times daily. 07/04/21  Yes Bensimhon, Shaune Pascal, MD  spironolactone (ALDACTONE) 25 MG tablet Take 1/2 tablet (12.5 mg total) by mouth daily. 06/24/21 07/24/21 Yes Pahwani, Einar Grad, MD  digoxin (LANOXIN) 0.125 MG tablet Take 0.5 tablets (0.0625 mg total) by mouth daily. 07/07/21   Bensimhon, Shaune Pascal, MD  furosemide (LASIX) 20 MG tablet Take 1 tablet (20 mg total) by mouth 2 (two) times a week. On mondays and fridays Patient taking differently: Take 20 mg by mouth 2 (two) times a week. On mondays and Friday 07/04/21 07/04/22  Bensimhon, Shaune Pascal, MD  ipratropium (ATROVENT) 0.03 % nasal spray Place 2 sprays into both nostrils every 12 (twelve) hours. 07/03/21   Copland, Gay Filler, MD    Past Medical History: Past Medical History:  Diagnosis Date   Chronic combined systolic and diastolic CHF (congestive heart failure) ([place])    followed by [name]   COPD (chronic obstructive pulmonary disease) ([place])    Coronary artery disease cardiologist-- [name]   per cardiac cath 01-05-2017  chronic total occlusion pLCx with collaterals, 99% severe calcified prox. to mid  RCA, otherwise mild to moderate CAD (medically managed)   Esophageal cancer, stage IIIB [facility]) oncologist-  dr ennever/  dr moody   dx 2010  SCC Stage IIIB completed chemoradiation;  localized recurrent left piriform sinus 01/ 2015,  completed concurrent chemoradiatoin 04/ 2015   GERD (gastroesophageal reflux disease)    09-07-2018   no issues since Gtube removed 06/ 2019   History of alcohol abuse    quit 2001   History of cancer chemotherapy    2010;   2015   History of radiation therapy    10-19-2013 to  12-05-2013 pyriform sinus 69.96 Gy/22fx;   Radiation completed 2010 for esophageal cancer   History of seizure 2001   alcohol withdrawal   HOH (hard of hearing)    Hyperlipidemia    Hypertension    Ischemic cardiomyopathy 12/2016   01-03-2017  echo,  ef 10-15%/   echo 05-06-2017 EF improved to 45-50%   Prostate cancer [facility]) urologist-  dr ottelin/  oncologist-- [name]   dx 05-27-2018--- Stage T1c,  Gleason 4+3,  PSA 9.3--  scheduled for brachytherapy 09-23-2017   Renal cyst, left     Past Surgical History: Past Surgical History:  Procedure Laterality Date   CARDIOVASCULAR STRESS TEST  10/09/09   normal nuclearr stress test, EF 57% [name])   CENTRAL LINE INSERTION  06/16/2021   Procedure: CENTRAL LINE INSERTION;  Surgeon: Jolaine Artist, MD;  Location: [place] [facility];  Service: Cardiovascular;;   IR GASTROSTOMY TUBE MOD SED  01/13/2017   IR GASTROSTOMY TUBE REMOVAL  02/03/2018   IR PATIENT EVAL TECH 0-60 MINS  03/25/2017   IR REMOVAL TUN ACCESS W/ PORT W/O FL MOD SED  01/15/2017   IR REPLACE G-TUBE SIMPLE WO FLUORO  01/13/2018   LARYNGOSCOPY N/A 09/15/2013   Procedure: LARYNGOSCOPY;  Surgeon: [name], MD;  Location: [place];  Service: ENT;  Laterality: N/A;  direct laryngoscopy with biopsy and esophagoscopy   RADIOACTIVE SEED IMPLANT N/A 09/23/2018   Procedure: RADIOACTIVE SEED IMPLANT/BRACHYTHERAPY IMPLANT;  Surgeon: [name], MD;  Location: [place];  Service: Urology;  Laterality: N/A;   RIGHT HEART CATH N/A 06/16/2021   Procedure: RIGHT HEART CATH;  Surgeon: Jolaine Artist, MD;  Location: [place] [facility];  Service: Cardiovascular;  Laterality:  N/A;   RIGHT/LEFT HEART CATH AND CORONARY ANGIOGRAPHY N/A 01/05/2017   Procedure: Right/Left Heart Cath and Coronary Angiography;  Surgeon: [name], MD;  Location: [place] [facility];  Service: Cardiovascular;  Laterality: N/A;   TRANSTHORACIC ECHOCARDIOGRAM  05/06/2017   ef 45-50%,  grade 1 diastolic dysfunction/  AV severe calcified non coronary cusp with moderate regurg. , no stenosis (valve area per echo 01-05-2017 1.08cm^2)/  mild MR, TR, and PR/  mild LAE    Family History: Family History  Problem Relation Age of Onset   Breast cancer Mother    Prostate cancer Neg Hx    Colon cancer Neg Hx    Pancreatic cancer Neg Hx     Social History: Social History   Socioeconomic History   Marital status: Legally Separated    Spouse name: Not on file   Number of children: 2   Years of education: Not on file   Highest education level: Not on file  Occupational History   Occupation: retired  Tobacco Use   Smoking status: Former    Packs/day: 1.00    Years: 40.00    Pack years: 40.00    Types: Cigarettes    Start date: 11/08/1960    Quit date: 08/31/2009    Years since quitting: 11.8   Smokeless tobacco: Former    Types: Chew    Quit date: 09/01/1999  Vaping Use   Vaping Use: Never used  Substance and Sexual Activity   Alcohol use: Not Currently    Alcohol/week: 0.0 standard drinks    Comment: quit in 2001   Drug use: No    Comment: back in the day used cocaine,alcohol, marijuana   Sexual activity: Yes    Partners: Female    Birth control/protection: None  Other Topics Concern   Not on file  Social History Narrative   Not on file   Social Determinants of Health   Financial Resource Strain: Not on file  Food Insecurity: Not on file  Transportation  Needs: Not on file  Physical Activity: Not on file  Stress: Not on file  Social Connections: Not on file    Allergies:  No Known Allergies  Objective:    Vital Signs:   Temp:  [97.4 F (36.3 C)-98.1 F (36.7 C)] 98.1 F (36.7 C) (11/08 1149) Pulse Rate:  [82-91] 91 (11/08 1344) Resp:  [16-19] 17 (11/08 1149) BP: (102-148)/(54-83) 102/54 (11/08 1344) SpO2:  [98 %-100 %] 100 % (11/08 1149) Weight:  [58 kg] 58 kg (11/08 0400) Last BM Date: 07/07/21  Weight change: Filed Weights   07/06/21 2345 07/08/21 0400  Weight: 62 kg 58 kg    Intake/Output:   Intake/Output Summary (Last 24 hours) at 07/08/2021 1611 Last data filed at 07/08/2021 1147 Gross per 24 hour  Intake 360 ml  Output 1450 ml  Net -1090 ml      Physical Exam    General:  Well appearing, thin AAM. No resp difficulty HEENT: normal Neck: supple. JVP 10 cm . Carotids 2+ bilat; no bruits. No lymphadenopathy or thyromegaly appreciated. Cor: PMI nondisplaced. Regular rate & rhythm. No rubs, gallops or murmurs. Lungs: decreased BS at the bases bilaterally  Abdomen: soft, nontender, nondistended. No hepatosplenomegaly. No bruits or masses. Good bowel sounds. Extremities: no cyanosis, clubbing, rash, edema Neuro: alert & orientedx3, cranial nerves grossly intact. moves all 4 extremities w/o difficulty. Affect pleasant   Telemetry   NSR 60s, one 8 beat run of NSVT  EKG  NSR 99 bpm, incomplete RBBB 1st degree AV block   Labs   Basic Metabolic Panel: Recent Labs  Lab 07/03/21 1058 07/04/21 1131 07/06/21 2354 07/07/21 2110 07/08/21 0409  NA 136 136 132* 131* 131*  K 4.2 4.2 4.7 4.1 4.0  CL 98 98 96* 93* 94*  CO2 32 30 26 31 27   GLUCOSE 86 109* 130* 99 82  BUN 19 14 26* 22 18  CREATININE 1.06 1.06 1.23 1.24 1.08  CALCIUM 9.2 9.1 9.3 8.5* 8.6*  MG  --  2.0  --  2.1 2.0    Liver Function Tests: Recent Labs  Lab 07/03/21 1058 07/06/21 2354  AST 26 30  ALT 84* 56*  ALKPHOS 56 57  BILITOT  0.5 0.9  PROT 6.6 6.9  ALBUMIN 3.6 3.2*   No results for input(s): LIPASE, AMYLASE in the last 168 hours. No results for input(s): AMMONIA in the last 168 hours.  CBC: Recent Labs  Lab 07/03/21 1058 07/04/21 1131 07/06/21 2354 07/08/21 0409  WBC 4.6 4.9 5.3 4.5  NEUTROABS  --   --  3.5  --   HGB 11.3* 10.9* 11.7* 11.1*  HCT 35.5* 36.0* 36.7* 35.2*  MCV 84.8 89.3 84.2 83.0  PLT 289.0 283 310 311    Cardiac Enzymes: No results for input(s): CKTOTAL, CKMB, CKMBINDEX, TROPONINI in the last 168 hours.  BNP: BNP (last 3 results) Recent Labs    06/02/21 0311 06/11/21 0225 07/06/21 2354  BNP 2,342.9* >4,500.0* >4,500.0*    ProBNP (last 3 results) Recent Labs    05/14/21 0935  PROBNP 4,378.0*     CBG: No results for input(s): GLUCAP in the last 168 hours.  Coagulation Studies: No results for input(s): LABPROT, INR in the last 72 hours.   Imaging   No results found.   Medications:     Current Medications:  apixaban  5 mg Oral BID   aspirin EC  81 mg Oral Daily   dapagliflozin propanediol  10 mg Oral Daily   digoxin  0.0625 mg Oral Daily   furosemide  20 mg Intravenous Daily   hydrALAZINE  37.5 mg Oral Q8H   isosorbide mononitrate  30 mg Oral Daily   ivabradine  2.5 mg Oral BID WC   pravastatin  80 mg Oral QPM   sacubitril-valsartan  1 tablet Oral BID   sodium chloride flush  3 mL Intravenous Q12H    Infusions:   Assessment/Plan   1.  Acute on Chronic Systolic Heart Failure  - Echo 2018 EF 10-15%, RV moderately reduced. LHC w/ MVCAD management medically - Echo 05/2020 EF down again, 35-40%. RV normal  - Echo 05/2021 EF down 25-30%, RV not well visualized. Mod MR and Mod AI. - Limited Echo 10/22: EF 20%, RV normal  - Recent admit 10/22 w/ a/c CHF w/ cardiogenic shock w/ AKI + Shock liver - RHC 06/16/21 w/ R>L HF and low output, CI 1.8. PAPi 1.3. Required milrinone but improved and able to wean off - now readmitted w/ a/c CHF in the setting of  dietary indiscretion w/ sodium. NYHA Class IIIb symptoms. BNP >4,500.  - Labs do not suggest low output but will check lactate  - Responding well to IV Lasix but remains 6 lb above previous dry wt - Continue IV Lasix, increase to 60 mg bid (currently 20 mg once daily). Check ReDs in am  - Continue Entresto 49/51 mg bid. - Continue hydral 37.5 mg tid + Imdur 30 mg daily.  -  Increase Spiro to 25 mg daily.  - Continue Farxiga 10 mg daily. - Continue ivabradine 2.5 mg bid.  - Continue Digoxin 0.625 (on reduced dose due to elevated level)  - Not felt to be VAD candidate due to cachexia and RV failure. Can reconsider as he improves - Follow BMP - Dietician consult to help w/ education regarding heart healthy food options  - At discharge, may need to increase Lasix from 2 days a week to daily    2. Pulmonary Embolism  - Diagnosed 10/22 - Limited echo w/ no RV strain  - Suspect incidental due HF - On Eliquis 5 mg bid, reports full compliance     3. CKD Stage IIIa - Baseline SCr 1.3 - SCr 1.08 today - monitor w/ diuresis  - On SGLT2i  4. H/o Pleural Effusion, right - s/p thoracentesis by CCM 06/11/21 - 1200 cc removed, transudative  - CXR this admit w/ bilateral pleural effusions, at least moderate on the right - c/w IV Lasix, if breathing not improved may need repeat US throacentesis     5. H/o esophageal CA and throat CA - s/p chemo and XRT   Length of Stay: 1  Brittainy Simmons, PA-C  07/08/2021, 4:11 PM  Advanced Heart Failure Team Pager [phone] (M-F; 7a - 5p)  Please contact [facility] for night-coverage after hours (4p -7a ) and weekends on [email or web address]   Patient seen with PA, agree with the above note.   See extensive history above.  Patient was recently admitted in 10/22 with low output HF with significant RV failure (PAPi 1.3).  He was started on milrinone but was able to wean off and discharge home.  He was taking Lasix twice a week at home, but it sounds like he  had significant dietary indiscretion (eating out, high sodium diet).  He returned to the ER Sunday night with progressive dyspnea.    He has received Lasix 20 mg IV  with some diuresis.   General: NAD Neck: JVP 16 cm, no thyromegaly or thyroid nodule.  Lungs: Decreased BS at bases.  CV: Nondisplaced PMI.  Heart regular S1/S2, no S3/S4, no murmur.  1+ ankle edema.  No carotid bruit.  Difficult to palpate pedal pulses.  Abdomen: Soft, nontender, no hepatosplenomegaly, no distention.  Skin: Intact without lesions or rashes.  Neurologic: Alert and oriented x 3.  Psych: Normal affect. Extremities: No clubbing or cyanosis.  HEENT: Normal.   Patient is readmitted with acute/chronic systolic CHF (ischemic cardiomyopathy).  He is significantly volume overloaded on exam, sounds like after significant dietary indiscretion.  Recent admission with low output HF but appears warm and wet on exam.  - Continue home GDMT, BP and creatinine (1.08) stable.  - Send lactate.  - Change Lasix to 60 mg IV bid.  Will need higher standing dose when he goes home.  - Not thought to be LVAD candidate with significant RV failure.   Loralie Champagne 07/08/2021 5:09 PM

## 2021-07-08 NOTE — Progress Notes (Signed)
Mobility Specialist Progress Note:   07/08/21 1104  Mobility  Activity Ambulated in hall  Level of Assistance Standby assist, set-up cues, supervision of patient - no hands on  Assistive Device None  Distance Ambulated (ft) 520 ft  Mobility Ambulated with assistance in hallway  Mobility Response Tolerated well  Mobility performed by Mobility specialist  Bed Position Chair  $Mobility charge 1 Mobility   Pt received in bed willing to participate in mobility. No complaints of pain and asymptomatic. Pt returned to chair with call bell in reach and all needs met.  [facility] Health and safety inspector Phone [phone]

## 2021-07-08 NOTE — Progress Notes (Signed)
TRH night cross cover note:  I was contacted by patient's RN who conveyed the patient's complaint of muscle cramps in the bilateral hands, with RN noting no current order for prn analgesia.  Per my chart review, this is a 70 year old male who is here with acute on chronic systolic heart failure, undergoing IV diuresis, raising the possibility of interval electrolyte derangement as a potential contribution to the above muscle cramps.  Consequently, I have ordered stat BMP and serum magnesium levels, although ensuing lab results demonstrated the following: Potassium level 4.0, magnesium level 2.0.  As he is here for acute on chronic systolic heart failure, will refrain from NSAIDs.  Rather, I placed an order for as needed acetaminophen, including for muscle cramps.    Babs Bertin, DO Hospitalist

## 2021-07-09 ENCOUNTER — Inpatient Hospital Stay (HOSPITAL_COMMUNITY): Payer: Medicare Other

## 2021-07-09 DIAGNOSIS — I5021 Acute systolic (congestive) heart failure: Secondary | ICD-10-CM

## 2021-07-09 DIAGNOSIS — I5023 Acute on chronic systolic (congestive) heart failure: Secondary | ICD-10-CM | POA: Diagnosis not present

## 2021-07-09 LAB — CBC
HCT: 39.1 % (ref 39.0–52.0)
Hemoglobin: 12.3 g/dL — ABNORMAL LOW (ref 13.0–17.0)
MCH: 26.4 pg (ref 26.0–34.0)
MCHC: 31.5 g/dL (ref 30.0–36.0)
MCV: 83.9 fL (ref 80.0–100.0)
Platelets: 340 10*3/uL (ref 150–400)
RBC: 4.66 MIL/uL (ref 4.22–5.81)
RDW: 15.9 % — ABNORMAL HIGH (ref 11.5–15.5)
WBC: 7 10*3/uL (ref 4.0–10.5)
nRBC: 0 % (ref 0.0–0.2)

## 2021-07-09 LAB — BASIC METABOLIC PANEL
Anion gap: 11 (ref 5–15)
BUN: 19 mg/dL (ref 8–23)
CO2: 28 mmol/L (ref 22–32)
Calcium: 8.6 mg/dL — ABNORMAL LOW (ref 8.9–10.3)
Chloride: 91 mmol/L — ABNORMAL LOW (ref 98–111)
Creatinine, Ser: 1.1 mg/dL (ref 0.61–1.24)
GFR, Estimated: >60 mL/min (ref >60)
Glucose, Bld: 116 mg/dL — ABNORMAL HIGH (ref 70–99)
Potassium: 4 mmol/L (ref 3.5–5.1)
Sodium: 130 mmol/L — ABNORMAL LOW (ref 135–145)

## 2021-07-09 LAB — ECHOCARDIOGRAM COMPLETE
Area-P 1/2: 3.15 cm2
Height: 66 in
P 1/2 time: 541 msec
S' Lateral: 4.8 cm
Weight: 1956.8 oz

## 2021-07-09 MED ORDER — SPIRONOLACTONE 25 MG PO TABS: 25.0000 mg | ORAL_TABLET | Freq: Every day | ORAL | 0 refills | Status: DC

## 2021-07-09 MED ORDER — FUROSEMIDE 40 MG PO TABS: 40.0000 mg | ORAL_TABLET | Freq: Every day | ORAL | 0 refills | Status: DC

## 2021-07-09 MED ORDER — DIGOXIN 62.5 MCG PO TABS: 0.0625 mg | ORAL_TABLET | Freq: Every day | ORAL | 0 refills | Status: DC

## 2021-07-09 MED ORDER — FUROSEMIDE 40 MG PO TABS
40.0000 mg | ORAL_TABLET | Freq: Every day | ORAL | Status: DC
  Administered 2021-07-09: 40 mg via ORAL
  Filled 2021-07-09: qty 1

## 2021-07-09 NOTE — Progress Notes (Signed)
TRH night cross cover note:  I was notified by RN of elevated lactate of 2.0.  Per chart review, it appears that this was ordered by cardiology exclusively as a component of acute on chronic systolic heart failure evaluation, for which the patient is undergoing IV diuresis.  Repeat lactate subsequently improved to 1.5 in absence of interval IVF's.     Babs Bertin, DO Hospitalist

## 2021-07-09 NOTE — Consult Note (Signed)
   [facility] Inpatient Consult   07/09/2021  Collin Young [birth year] [id number]  [facility] [ACO] Patient: Collin Young  Patient was screened for [place] Management services. Patient will have the transition of care call conducted by the primary care provider. This patient is also in an Embedded practice which has a chronic disease management Embedded Care Management team. He has been active with Embedded CCM team.  1450  Went to speak with patient and patient currently not in the room.  Plan: Notification sent to the [facility] [place] Management team to make aware of ongoing follow up needs.  Please contact for further questions,  Natividad Brood, RN BSN [facility] Liaison  [phone] business mobile phone Toll free office [phone]  Fax number: [phone] [email or web address]

## 2021-07-09 NOTE — Evaluation (Signed)
Physical Therapy Evaluation Patient Details Name: Collin Young MRN: [id number] DOB: [birth year] Today's Date: 07/09/2021  History of Present Illness  70 y.o. male presents to [facility] hospital on 07/06/2021 with SOB. Pt found to be in acute on chronic systolic heart failure. Pt also with recent PE diagnosis on 06/21/2021. PMH includes iCM, stage IIIa CKD, hx pleural effusion, recent PE, hx esophageal and throat cancer.  Clinical Impression  Pt presents to PT at or near his functional baseline. Pt is able to perform all mobility required in the home and community independently and accepts multiple dynamic gait challenges without balance deviation. Pt reports no fatigue or SOB. Pt has no current acute PT needs. Acute PT signing off.       Recommendations for follow up therapy are one component of a multi-disciplinary discharge planning process, led by the attending physician.  Recommendations may be updated based on patient status, additional functional criteria and insurance authorization.  Follow Up Recommendations No PT follow up    Assistance Recommended at Discharge    Functional Status Assessment Patient has not had a recent decline in their functional status  Equipment Recommendations  None recommended by PT    Recommendations for Other Services       Precautions / Restrictions Precautions Precautions: None Restrictions Weight Bearing Restrictions: No      Mobility  Bed Mobility Overal bed mobility: Independent             General bed mobility comments: pt received in recliner    Transfers Overall transfer level: Independent                      Ambulation/Gait Ambulation/Gait assistance: Modified independent (Device/Increase time) Gait Distance (Feet): 600 Feet Assistive device: None Gait Pattern/deviations: WFL(Within Functional Limits) Gait velocity: functional Gait velocity interpretation: >2.62 ft/sec, indicative of community ambulatory   General  Gait Details: pt with steady step-through gait, is able to change speeds and step length, stop abruptly, turn quickly, walk backward, all without loss of balance  Stairs            Wheelchair Mobility    Modified Rankin (Stroke Patients Only)       Balance Overall balance assessment: No apparent balance deficits (not formally assessed)                                           Pertinent Vitals/Pain Pain Assessment: No/denies pain    Home Living Family/patient expects to be discharged to:: Private residence Living Arrangements: Alone Available Help at Discharge: Family;Available PRN/intermittently Type of Home: Apartment Home Access: Level entry       Home Layout: One level Home Equipment: None;Grab bars - tub/shower Additional Comments: family lives close by if needed    Prior Function Prior Level of Function : Independent/Modified Independent             Mobility Comments: I with household/community mobility ADLs Comments: drives, grocery shops, cooks, cleans.     Hand Dominance   Dominant Hand: Right    Extremity/Trunk Assessment   Upper Extremity Assessment Upper Extremity Assessment: Overall WFL for tasks assessed    Lower Extremity Assessment Lower Extremity Assessment: Overall WFL for tasks assessed    Cervical / Trunk Assessment Cervical / Trunk Assessment: Normal  Communication   Communication: HOH  Cognition Arousal/Alertness: Awake/alert Behavior During Therapy: WFL for tasks  assessed/performed Overall Cognitive Status: Within Functional Limits for tasks assessed                                          General Comments General comments (skin integrity, edema, etc.): VSS on RA    Exercises     Assessment/Plan    PT Assessment Patient does not need any further PT services  PT Problem List         PT Treatment Interventions      PT Goals (Current goals can be found in the Care Plan  section)       Frequency     Barriers to discharge        Co-evaluation               AM-PAC PT "6 Clicks" Mobility  Outcome Measure Help needed turning from your back to your side while in a flat bed without using bedrails?: None Help needed moving from lying on your back to sitting on the side of a flat bed without using bedrails?: None Help needed moving to and from a bed to a chair (including a wheelchair)?: None Help needed standing up from a chair using your arms (e.g., wheelchair or bedside chair)?: None Help needed to walk in hospital room?: None Help needed climbing 3-5 steps with a railing? : None 6 Click Score: 24    End of Session   Activity Tolerance: Patient tolerated treatment well Patient left: in chair;with call bell/phone within reach Nurse Communication: Mobility status;Other (comment) PT Visit Diagnosis: Muscle weakness (generalized) (M62.81)    Time: 1139-1150 PT Time Calculation (min) (ACUTE ONLY): 11 min   Charges:   PT Evaluation $PT Eval Low Complexity: [place], PT, DPT Acute Rehabilitation Pager: [phone] Office [phone]   Zenaida Niece 07/09/2021, 12:00 PM

## 2021-07-09 NOTE — Progress Notes (Addendum)
Advanced Heart Failure Rounding Note  PCP-Cardiologist: Skeet Latch, MD   Subjective:   Feeling well today. No dyspnea.   Neg 2.6L yesterday with IV lasix 80 BID. Weight down another 5 lb.   Lactic acid 2.0 > 1.5  Na 136 > 132> 130. Scr stable.     Objective:   Weight Range: 55.5 kg Body mass index is 19.74 kg/m.   Vital Signs:   Temp:  [97.9 F (36.6 C)-98.5 F (36.9 C)] 97.9 F (36.6 C) (11/09 0830) Pulse Rate:  [86-92] 86 (11/09 0830) Resp:  [16-18] 16 (11/09 0830) BP: (102-131)/(54-72) 115/69 (11/09 0830) SpO2:  [95 %-100 %] 95 % (11/09 0830) Weight:  [55.5 kg] 55.5 kg (11/09 0352) Last BM Date: 07/07/21  Weight change: Filed Weights   07/06/21 2345 07/08/21 0400 07/09/21 0352  Weight: 62 kg 58 kg 55.5 kg    Intake/Output:   Intake/Output Summary (Last 24 hours) at 07/09/2021 0925 Last data filed at 07/09/2021 0905 Gross per 24 hour  Intake 963 ml  Output 4100 ml  Net -3137 ml      Physical Exam    General:  Thin, chronically ill appearing. No distress HEENT: Normal Neck: Supple. No JVD . Carotids 2+ bilat; no bruits. No lymphadenopathy or thyromegaly appreciated. Cor: PMI nondisplaced. Regular rate & rhythm. No rubs, gallops or murmurs. Lungs: Decreased at bases Abdomen: Soft, nontender, nondistended. No hepatosplenomegaly. No bruits or masses. Good bowel sounds. Extremities: No cyanosis, clubbing, rash, edema Neuro: Alert & orientedx3, cranial nerves grossly intact. moves all 4 extremities w/o difficulty. Affect pleasant   Telemetry   NSR 60s-70s   Labs    CBC Recent Labs    07/06/21 2354 07/08/21 0409 07/09/21 0329  WBC 5.3 4.5 7.0  NEUTROABS 3.5  --   --   HGB 11.7* 11.1* 12.3*  HCT 36.7* 35.2* 39.1  MCV 84.2 83.0 83.9  PLT 310 311 650   Basic Metabolic Panel Recent Labs    07/07/21 2110 07/08/21 0409 07/09/21 0329  NA 131* 131* 130*  K 4.1 4.0 4.0  CL 93* 94* 91*  CO2 31 27 28   GLUCOSE 99 82 116*  BUN 22 18  19   CREATININE 1.24 1.08 1.10  CALCIUM 8.5* 8.6* 8.6*  MG 2.1 2.0  --    Liver Function Tests Recent Labs    07/06/21 2354  AST 30  ALT 56*  ALKPHOS 57  BILITOT 0.9  PROT 6.9  ALBUMIN 3.2*   No results for input(s): LIPASE, AMYLASE in the last 72 hours. Cardiac Enzymes No results for input(s): CKTOTAL, CKMB, CKMBINDEX, TROPONINI in the last 72 hours.  BNP: BNP (last 3 results) Recent Labs    06/02/21 0311 06/11/21 0225 07/06/21 2354  BNP 2,342.9* >4,500.0* >4,500.0*    ProBNP (last 3 results) Recent Labs    05/14/21 0935  PROBNP 4,378.0*     D-Dimer No results for input(s): DDIMER in the last 72 hours. Hemoglobin A1C No results for input(s): HGBA1C in the last 72 hours. Fasting Lipid Panel No results for input(s): CHOL, HDL, LDLCALC, TRIG, CHOLHDL, LDLDIRECT in the last 72 hours. Thyroid Function Tests No results for input(s): TSH, T4TOTAL, T3FREE, THYROIDAB in the last 72 hours.  Invalid input(s): FREET3  Other results:   Imaging    [place] EKG SITE RITE  Result Date: 07/08/2021 If Site Rite image not attached, placement could not be confirmed due to current cardiac rhythm.    Medications:     Scheduled Medications:  apixaban  5 mg Oral BID   aspirin EC  81 mg Oral Daily   dapagliflozin propanediol  10 mg Oral Daily   digoxin  0.0625 mg Oral Daily   furosemide  60 mg Intravenous BID   hydrALAZINE  37.5 mg Oral Q8H   isosorbide mononitrate  30 mg Oral Daily   ivabradine  2.5 mg Oral BID WC   pravastatin  80 mg Oral QPM   sacubitril-valsartan  1 tablet Oral BID   sodium chloride flush  3 mL Intravenous Q12H   spironolactone  25 mg Oral Daily    Infusions:   PRN Medications: acetaminophen **OR** acetaminophen, albuterol, senna-docusate    Patient Profile   Collin Young is a 70 y.o. male with history of chronic systolic HF, iCM, stage IIIa CKD, hx pleural effusion, recent PE, hx esophageal and throat cancer. Now admitted with a/c  systolic HF in setting of dietary indiscretions.  Assessment/Plan   1.  Acute on Chronic Systolic Heart Failure  - Echo 2018 EF 10-15%, RV moderately reduced. LHC w/ MVCAD management medically - Echo 05/2020 EF down again, 35-40%. RV normal  - Echo 05/2021 EF down 25-30%, RV not well visualized. Mod MR and Mod AI. - Limited Echo 10/22: EF 20%, RV normal  - Recent admit 10/22 w/ a/c CHF w/ cardiogenic shock w/ AKI + Shock liver - RHC 06/16/21 w/ R>L HF and low output, CI 1.8. PAPi 1.3. Required milrinone but improved and able to wean off - now readmitted w/ a/c CHF in the setting of dietary indiscretion w/ sodium. NYHA Class IIIb symptoms. BNP >4,500.  - Echo pending - Lactic acid 2.0 > 1.5. HCO3 okay. Appears warm. - Responding well to IV Lasix, down total of 14 lb. Can switch to po lasix 40 mg daily (previously on 20 mg twice weekly). ReDS 34% - Continue Entresto 49/51 mg bid. - Continue hydral 37.5 mg tid + Imdur 30 mg daily.  - Increase Spiro to 25 mg daily.  - Continue Farxiga 10 mg daily. - Continue ivabradine 2.5 mg bid.  - Continue Digoxin 0.625 (on reduced dose due to elevated level)  - Not felt to be VAD candidate due to cachexia and RV failure. Can reconsider as he improves. May need another RHC at some point. - Dietician consult to help w/ education regarding heart healthy food options    2. Pulmonary Embolism  - Diagnosed 10/22 - Limited echo w/ no RV strain  - Suspect incidental due HF - On Eliquis 5 mg bid, reports full compliance    3. CKD Stage IIIa - SCr stable - monitor w/ diuresis  - On SGLT2i   4. H/o Pleural Effusion, right - s/p thoracentesis by CCM 06/11/21 - 1200 cc removed, transudative  - CXR this admit w/ bilateral pleural effusions, at least moderate on the right - Dyspnea improved with diuresis   5. H/o esophageal CA and throat CA - s/p chemo and XRT  6. Hyponatremia -Likely hypervolemic -Fluid restrict -510>258>527   PT/OT saw. No f/u  needs.  Okay for discharge from HF perspective. HF medications: Apixaban 5 mg BID ASA 81 mg Digoxin 0.0625 mg Furosemide 40 mg daily Imdur 30 mg daily Hydralazine 37.5 mg TID Corlanor 2.5 mg BID Pravastatin 80 mg daily Entresto 49/51 mg BID Spiro 25 mg daily Farxiga 10 mg daily   F/u in HF clinic scheduled.   Length of Stay: 2  Collin Young, Collin N, PA-C  07/09/2021, 9:25 AM  Advanced Heart Failure  Team Pager [phone] (M-F; 7a - 5p)  Please contact [facility] for night-coverage after hours (5p -7a ) and weekends on [email or web address]    Patient seen and examined with the above-signed Advanced Practice Provider and/or Housestaff. I personally reviewed laboratory data, imaging studies and relevant notes. I independently examined the patient and formulated the important aspects of the plan. I have edited the note to reflect any of my changes or salient points. I have personally discussed the plan with the patient and/or family.  Lactic acidosis improved. Breathing better with diuresis. Warm on exam JVP down. ReDS 34%  General:  Thin male sittin gin chair eating . No resp difficulty [place] HEENT: normal Neck: supple. no JVD. Carotids 2+ bilat; no bruits. No lymphadenopathy or thryomegaly appreciated. Cor: PMI nondisplaced. Regular rate & rhythm. No rubs, gallops or murmurs. Lungs: clear Abdomen: soft, nontender, nondistended. No hepatosplenomegaly. No bruits or masses. Good bowel sounds. Extremities: no cyanosis, clubbing, rash, edema Neuro: alert & orientedx3, cranial nerves grossly intact. moves all 4 extremities w/o difficulty. Affect pleasant  He is stable for d/c today. Start lasix 40 mg daily. Wilfollow closely in HF Clinic. Will need formal assessment to determine VAD candidacy but suspect limited due to RV failure and cachexia.   Glori Bickers, MD  12:44 PM

## 2021-07-09 NOTE — Progress Notes (Signed)
Pt d/c to home, family to pick up pt from 5th floor, spoke with pt's daughter and reviewed d/c instructions. Pt ambulatory, no sob no pain, PIV removed, dressing applied and pressure held. No ditress

## 2021-07-09 NOTE — TOC CM/SW Note (Addendum)
Transition of Care [facility]) - Initial/Assessment Note    Patient Details  Name: Collin Young MRN: [id number] Date of Birth: [birth year]  [facility] CM/SW Contact:    Erenest Rasher, RN Phone Number: [phone] 07/09/2021, 5:47 PM  Clinical Narrative:                 HF TOC CM spoke to dtr and states pt is independent at home. She does his meds in the pill planner. States she and her sister will assist him on his diet and following a heart healthy low sodium diet. Pt has scale at home. He drives to his appts or they will assist him. Lives in home alone.   Expected Discharge Plan: Home/Self Care Barriers to Discharge: No Barriers Identified   Patient Goals and CMS Choice   CMS Medicare.gov Compare Post Acute Care list provided to:: Patient Choice offered to / list presented to : Patient  Expected Discharge Plan and Services Expected Discharge Plan: Home/Self Care In-house Referral: Clinical Social Work Discharge Planning Services: CM Consult     Expected Discharge Date: 07/09/21                                    Prior Living Arrangements/Services   Lives with:: Self          Need for Family Participation in Patient Care: No (Comment) Care giver support system in place?: No (comment)   Criminal Activity/Legal Involvement Pertinent to Current Situation/Hospitalization: No - Comment as needed  Activities of Daily Living      Permission Sought/Granted Permission sought to share information with : Case Manager, Family Supports, PCP Permission granted to share information with : Yes, Verbal Permission Granted  Share Information with NAME: Vanessa Lukachukai     Permission granted to share info w Relationship: daugther  Permission granted to share info w Contact Information: [phone]  Emotional Assessment       Orientation: : Oriented to Self, Oriented to Place, Oriented to  Time, Oriented to Situation   Psych Involvement: No  (comment)  Admission diagnosis:  Acute on chronic combined systolic and diastolic congestive heart failure (HCC) [I50.43] Acute on chronic systolic CHF (congestive heart failure) ([place]) [I50.23] Patient Active Problem List   Diagnosis Date Noted   Acute on chronic systolic CHF (congestive heart failure) ([place]) 07/07/2021   COPD (chronic obstructive pulmonary disease) (HCC)    Hyponatremia    SOB (shortness of breath)    DNR (do not resuscitate)    Weakness generalized    Elevated LFTs    Pulmonary embolism ([place]) 06/11/2021   Acute on chronic heart failure ([place]) 06/02/2021   GERD without esophagitis 05/08/2021   Mixed hyperlipidemia 05/08/2021   Hypertensive urgency 05/08/2021   Elevated troponin level not due myocardial infarction 05/08/2021   Pure hypercholesterolemia 09/23/2020   Claudication in peripheral vascular disease ([place]) 01/25/2020   Late latent syphilis 01/15/2020   Coronary artery disease involving native coronary artery of native heart 07/05/2019   Chronic kidney disease, stage 3a ([place]) 07/05/2019   Combined systolic and diastolic heart failure ([place]) 09/26/2018   Malignant neoplasm of prostate ([place]) 06/17/2018   Medication management    Dysphagia    Palliative care by specialist    Ischemic cardiomyopathy    Congestive dilated cardiomyopathy ([place])    Acute on chronic systolic congestive heart failure ([place]) 01/02/2017   Malignant neoplasm of  pyriform sinus ([place]) 09/28/2013   Piriform sinus tumor 09/24/2013   NONSPECIFIC ABN FINDING RAD & OTH EXAM GI TRACT 05/14/2009   PCP:  Darreld Mclean, MD Pharmacy:   [id number] [address], [place] [place] [address] Phone: [phone] Fax: [phone]  [facility] [address] [address] Phone: [phone] Fax: [phone]     Social Determinants of Health (SDOH)  Interventions    Readmission Risk Interventions Readmission Risk Prevention Plan 06/03/2021  Transportation Screening Complete  PCP or Specialist Appt within 5-7 Days Complete  Home Care Screening Complete  Medication Review (RN CM) Complete  Some recent data might be hidden

## 2021-07-09 NOTE — Progress Notes (Signed)
Echocardiogram 2D Echocardiogram has been performed.  Oneal Deputy Bayron Dalto RDCS 07/09/2021, 11:02 AM

## 2021-07-09 NOTE — Evaluation (Signed)
Occupational Therapy Evaluation Patient Details Name: Collin Young MRN: [id number] DOB: August 13, 1951 Today's Date: 07/09/2021   History of Present Illness 70 y.o. male with medical history significant for chronic systolic CHF, PE on Eliquis, esophageal cancer status post chemoradiation, now presenting to the emergency department with shortness of breath.  Patient reports worsening shortness of breath over the past 2 days, becoming severe even while at rest last night.   Clinical Impression   Patient admitted for the diagnosis above.  PTA he lives alone in an apartment, and needs no assist with ADL/IADL, he continues to drive, cares for all his meds, and daughters live nearby if needed.  Currently he is back to his baseline with no acute OT needs identified.  Patient is not interested in [facility] services, and frankly no post acute OT is necessary.      Recommendations for follow up therapy are one component of a multi-disciplinary discharge planning process, led by the attending physician.  Recommendations may be updated based on patient status, additional functional criteria and insurance authorization.   Follow Up Recommendations  No OT follow up    Assistance Recommended at Discharge  None  Functional Status Assessment   At baseline  Equipment Recommendations    None   Recommendations for Other Services       Precautions / Restrictions Precautions Precautions: None Restrictions Weight Bearing Restrictions: No      Mobility Bed Mobility Overal bed mobility: Independent                  Transfers Overall transfer level: Independent                        Balance Overall balance assessment: No apparent balance deficits (not formally assessed)                                         ADL either performed or assessed with clinical judgement   ADL Overall ADL's : At baseline                                              Vision Baseline Vision/History: 1 Wears glasses Patient Visual Report: No change from baseline       Perception Perception Perception: Not tested   Praxis Praxis Praxis: Not tested    Pertinent Vitals/Pain Pain Assessment: No/denies pain     Hand Dominance Right   Extremity/Trunk Assessment Upper Extremity Assessment Upper Extremity Assessment: Overall WFL for tasks assessed   Lower Extremity Assessment Lower Extremity Assessment: Defer to PT evaluation   Cervical / Trunk Assessment Cervical / Trunk Assessment: Normal   Communication Communication Communication: HOH   Cognition Arousal/Alertness: Awake/alert Behavior During Therapy: WFL for tasks assessed/performed Overall Cognitive Status: Within Functional Limits for tasks assessed                                       General Comments  O2 sats steady on RA at 96% with HR to 98 BPM    Exercises     Shoulder Instructions      Home Living Family/patient expects to be discharged to:: Private residence Living Arrangements: Alone  Available Help at Discharge: Family;Available PRN/intermittently Type of Home: Apartment Home Access: Level entry     Home Layout: One level     Bathroom Shower/Tub: Teacher, early years/pre: Standard     Home Equipment: None;Grab bars - tub/shower   Additional Comments: family lives close by if needed      Prior Functioning/Environment Prior Level of Function : Independent/Modified Independent               ADLs Comments: drives, grocery shops, cooks, cleans.        OT Problem List: Decreased activity tolerance      OT Treatment/Interventions:      OT Goals(Current goals can be found in the care plan section) Acute Rehab OT Goals Patient Stated Goal: I'm ready to go home OT Goal Formulation: With patient Time For Goal Achievement: 07/09/21 Potential to Achieve Goals: Good  OT Frequency:     Barriers to D/C:  None noted           Co-evaluation              AM-PAC OT "6 Clicks" Daily Activity     Outcome Measure Help from another person eating meals?: None Help from another person taking care of personal grooming?: None Help from another person toileting, which includes using toliet, bedpan, or urinal?: None Help from another person bathing (including washing, rinsing, drying)?: None Help from another person to put on and taking off regular upper body clothing?: None Help from another person to put on and taking off regular lower body clothing?: None 6 Click Score: 24   End of Session Nurse Communication: Mobility status  Activity Tolerance: Patient tolerated treatment well Patient left: in chair;with call bell/phone within reach  OT Visit Diagnosis: Other (comment) (shortness of breath)                Time: 3382-5053 OT Time Calculation (min): 16 min Charges:  OT General Charges $OT Visit: 1 Visit OT Evaluation $OT Eval Moderate Complexity: 1 Mod  07/09/2021  RP, OTR/L  Acute Rehabilitation Services  Office:  [phone]   Metta Clines 07/09/2021, 10:22 AM

## 2021-07-09 NOTE — Discharge Summary (Signed)
Physician Discharge Summary  Collin Young XVQ:[id number] DOB: 1951/05/15 DOA: 07/06/2021  PCP: Darreld Mclean, MD  Admit date: 07/06/2021 Discharge date: 07/09/2021 30 Day Unplanned Readmission Risk Score    Flowsheet Row ED to Hosp-Admission (Current) from 07/06/2021 in [place] [facility]U  30 Day Unplanned Readmission Risk Score (%) 30.12 Filed at 07/09/2021 1200       This score is the patient's risk of an unplanned readmission within 30 days of being discharged (0 -100%). The score is based on dignosis, age, lab data, medications, orders, and past utilization.   Low:  0-14.9   Medium: 15-21.9   High: 22-29.9   Extreme: 30 and above          Admitted From: Home Disposition: Home  Recommendations for Outpatient Follow-up:  Follow up with PCP in 1-2 weeks Please obtain BMP/CBC in one week Follow-up with cardiology. Please follow up with your PCP on the following pending results: Unresulted Labs (From admission, onward)     Start     Ordered   07/09/21 0600  Cooxemetry Panel (carboxy, met, total hgb, O2 sat)  Once,   R        07/09/21 0600   07/08/21 0932  Basic metabolic panel  Daily,   R      07/07/21 0606   07/08/21 0500  CBC  Daily,   R      07/07/21 0606              Home Health: None  Equipment/Devices: None  Discharge Condition: Stable CODE STATUS: Partial code Diet recommendation: Low-sodium/cardiac  Subjective: Seen and examined.  He has no complaints.  No shortness of breath or chest pain.  He is eager to go home.  Brief/Interim Summary: Collin Young is a pleasant 70 y.o. male with medical history significant for chronic systolic CHF (EF < 67%), PE on Eliquis, esophageal cancer status post chemoradiation, presented to the ED on evening of 07/06/21 with worsening shortness of breath x 2 days. Also reported a mild nonproductive cough, no chest pain or leg swelling, no fevers or chills.  Reported strict adherence with his medications.    Of note, pt admitted 4 weeks ago with cardiogenic shock and shock liver, found to have acute PE and was started on Eliquis.  Hospital course was complicated by AKI and hyponatremia which improved with tolvaptan and fluid restriction and had normalized by time of discharge follow-up. He followed up with cardiology on 07/04/2021, was found to have mildly elevated volume status and started on twice weekly Lasix at that time.   Chest xray showed bibasilar volume loss with bilateral pleural effusions and atelectasis. Similar findings to prior exams. Cardiomegaly.   BNP was > 4500.  Admitted to [facility] service with a diagnosis of acute on chronic systolic CHF and started on IV diuresis, heart failure team consulted-EF less than 20%.  He has improved, seen and cleared by cardiology with recommendations to discharge on Lasix 40 mg p.o. daily and resuming rest of the home medications.  Pulmonary embolism -diagnosed during admission last month. --Continue Eliquis  Hyponatremia -mild, present with sodium 132 in the setting of hypervolemia.  During recent admission, sodium was low at 125 but improved after tolvaptan and fluid restriction.  Currently 130.  This is close to his baseline.   COPD -not acutely exacerbated, no wheezing on exam --Continue as needed albuterol  Discharge Diagnoses:  Principal Problem:   Acute on chronic systolic CHF (congestive heart  failure) ([place]) Active Problems:   Pulmonary embolism (HCC)   COPD (chronic obstructive pulmonary disease) (HCC)   Hyponatremia    Discharge Instructions   Allergies as of 07/09/2021   No Known Allergies      Medication List     TAKE these medications    albuterol 108 (90 Base) MCG/ACT inhaler Commonly known as: VENTOLIN HFA INHALE 2 PUFFS INTO THE LUNGS EVERY 6 HOURS AS NEEDED FOR WHEEZING OR SHORTNESS OF BREATH   Bayer Low Dose 81 MG EC tablet Generic drug: aspirin Take 81 mg by mouth daily. Swallow whole.   Corlanor 5 MG Tabs  tablet Generic drug: ivabradine Take 1/2 tablet (2.5 mg total) by mouth 2 (two) times daily with a meal.   dapagliflozin propanediol 10 MG Tabs tablet Commonly known as: FARXIGA Take 1 tablet (10 mg total) by mouth daily.   Digoxin 62.5 MCG Tabs Take 0.0625 mg by mouth daily. Start taking on: July 10, 2021   Eliquis 5 MG Tabs tablet Generic drug: apixaban Take 1 tablet (5 mg total) by mouth 2 (two) times daily.   Entresto 49-51 MG Generic drug: sacubitril-valsartan Take 1 tablet by mouth 2 (two) times daily.   fentaNYL 12 MCG/HR Commonly known as: [place] 1 patch onto the skin every 3 (three) days.   furosemide 40 MG tablet Commonly known as: LASIX Take 1 tablet (40 mg total) by mouth daily. What changed:  medication strength how much to take when to take this additional instructions   hydrALAZINE 25 MG tablet Commonly known as: APRESOLINE Take 1.5 tablets (37.5 mg total) by mouth every 8 (eight) hours.   ipratropium 0.03 % nasal spray Commonly known as: ATROVENT Place 2 sprays into both nostrils every 12 (twelve) hours.   isosorbide mononitrate 30 MG 24 hr tablet Commonly known as: IMDUR Take 1 tablet (30 mg total) by mouth daily.   multivitamin tablet Take 1 tablet by mouth 3 (three) times a week.   polyethylene glycol 17 g packet Commonly known as: MIRALAX / GLYCOLAX Take 17 g by mouth daily as needed for mild constipation. What changed: reasons to take this   pravastatin 80 MG tablet Commonly known as: PRAVACHOL Take 1 tablet (80 mg total) by mouth every evening.   Repatha SureClick 956 MG/ML Soaj Generic drug: Evolocumab Inject 140 mg into the skin every 14 (fourteen) days.   spironolactone 25 MG tablet Commonly known as: ALDACTONE Take 1 tablet (25 mg total) by mouth daily. Start taking on: July 10, 2021 What changed: how much to take        [place]  Follow up on 07/15/2021.   Specialty: Cardiology Why: [facility] at [facility] 10 am Entrance C, Garage Code 3875 Contact information: [address] [phone] [place]        Darreld Mclean, MD Follow up in 1 week(s).   Specialty: Family Medicine Contact information: [address] [place] [address] [phone]         Skeet Latch, MD .   Specialty: Cardiology Contact information: [address] [place] [address] [phone]                No Known Allergies  Consultations: Cardiology   Procedures/Studies: DG Chest 1 View  Result Date: 06/14/2021 CLINICAL DATA:  New onset shortness of breath EXAM: PORTABLE CHEST 1  VIEW COMPARISON:  06/11/2021 FINDINGS: Cardiac shadow is enlarged but stable. Aortic calcifications are noted. Recurrent right-sided pleural effusion is noted. No focal infiltrate is seen. No bony abnormality is noted. IMPRESSION: Recurrent small right-sided pleural effusion. Electronically Signed   By: Inez Catalina M.D.   On: 06/14/2021 02:21   DG Chest 2 View  Result Date: 07/07/2021 CLINICAL DATA:  Shortness of breath.  CHF. EXAM: CHEST - 2 VIEW COMPARISON:  Radiograph 06/14/2021, additional priors, including chest CT 06/11/2021 FINDINGS: Bibasilar volume loss with bilateral pleural effusions and atelectasis. Cardiomegaly is similar. Aortic atherosclerosis. No convincing pulmonary edema. There is biapical pleuroparenchymal scarring. The bones are under mineralized unchanged mild midthoracic compression fractures. IMPRESSION: 1. Bibasilar volume loss with bilateral pleural effusions and atelectasis. Similar findings to prior exams. 2. Cardiomegaly. Electronically Signed   By: Keith Rake M.D.   On: 07/07/2021 00:25   DG Chest 2 View  Result Date: 06/11/2021 CLINICAL DATA:  Shortness of breath.  COPD. EXAM: CHEST - 2 VIEW  COMPARISON:  06/02/2021 FINDINGS: Shallow inspiration. Heart size and pulmonary vascularity are normal for technique. Small bilateral pleural effusions, greater on the right. Infiltration or atelectasis in the right lung base, likely pneumonia. There is progression since the previous study. Calcified and tortuous aorta. No pneumothorax. IMPRESSION: Small bilateral pleural effusions, greater on the right. Infiltration or atelectasis in the right lung base likely represents pneumonia. Progression since prior study. Electronically Signed   By: Lucienne Capers M.D.   On: 06/11/2021 01:28   CT Angio Chest PE W/Cm &/Or Wo Cm  Result Date: 06/11/2021 CLINICAL DATA:  Shortness of breath.  Pulmonary embolism suspected EXAM: CT ANGIOGRAPHY CHEST WITH CONTRAST TECHNIQUE: Multidetector CT imaging of the chest was performed using the standard protocol during bolus administration of intravenous contrast. Multiplanar CT image reconstructions and MIPs were obtained to evaluate the vascular anatomy. CONTRAST:  40m OMNIPAQUE IOHEXOL 350 MG/ML SOLN COMPARISON:  05/08/2021 FINDINGS: Cardiovascular: Enlarged heart. Motion degraded study but still positive for a tubular filling defect in lobar to segmental right lower lobe pulmonary arteries, nonocclusive. Bilateral posterior basal segment left lower lobe pulmonary arteries are nonenhancing and small, suspect remote emboli. No RV dilatation when compared to the left ventricle. No enhancement of the systemic arterial tree. Atheromatous calcification of the aorta and coronaries. Mediastinum/Nodes: Negative for adenopathy or mass. Lungs/Pleura: Large right and small left pleural effusions. Interlobular septal thickening. No pneumothorax. No consolidation. Fibrotic scar-like appearance in the apical lungs. Upper Abdomen: Atheromatous calcification. Left hilar calcification is likely atheromatous based on prior CT. Musculoskeletal: No acute finding. Review of the MIP images confirms  the above findings. Critical Value/emergent results were called by telephone at the time of interpretation on 06/11/2021 at 5:18 am [facility] who verbally acknowledged these results. IMPRESSION: 1. Positive for lobar to segmental pulmonary embolism in the right lower lobe. 2. Chronic poor enhancement of lower lobe segmental vessels which may be related to remote emboli or chronic shunting away from compressive atelectasis. 3. Large right and small left pleural effusion similar to CT 1 month ago. Mild interstitial edema. 4. Aortic Atherosclerosis (ICD10-I70.0).  Coronary atherosclerosis. Electronically Signed   By: JJorje GuildM.D.   On: 06/11/2021 05:20   CARDIAC CATHETERIZATION  Result Date: 06/16/2021 Findings: RA = 15 RV =  35/14 PA = 36/17 (25) PCW = 13 Fick cardiac output/index = 2.9/1.8 Thermo CO/CI = 3.1/1.9 PVR = 4.0 WU SVR = 2,166 Ao sat = 99% PA sat = 55%,  56% PaPI = 1.3 Assessment: Cardiogenic shock with R>L heart failure Plan/Discussion: Overall prognosis appears quite poor. Not candidate for advanced therapies. Will start inotropes and assess response. Glori Bickers, MD 1:21 PM  DG Chest Port 1 View  Result Date: 06/11/2021 CLINICAL DATA:  Status post thoracentesis. EXAM: PORTABLE CHEST 1 VIEW COMPARISON:  June 11, 2021. FINDINGS: No pneumothorax is noted. Right pleural effusion is significantly smaller. IMPRESSION: No pneumothorax status post thoracentesis. Electronically Signed   By: Marijo Conception M.D.   On: 06/11/2021 14:33   US LIVER DOPPLER  Result Date: 06/15/2021 CLINICAL DATA:  Elevated LFTs EXAM: DUPLEX ULTRASOUND OF LIVER TECHNIQUE: Color and duplex Doppler ultrasound was performed to evaluate the hepatic in-flow and out-flow vessels. COMPARISON:  06/15/2021 FINDINGS: Liver: Normal parenchymal echogenicity. Normal hepatic contour without nodularity. No focal lesion, mass or intrahepatic biliary ductal dilatation. Main Portal Vein size: 1.3 cm  Portal Vein Velocities Main Prox:  27 cm/sec Main Mid: 37 cm/sec Main Dist:  33 cm/sec Right: 25 cm/sec Left: 27 cm/sec Hepatic Vein Velocities Right:  90 cm/sec Middle:  61 cm/sec Left:  81 cm/sec IVC: Present and patent with normal respiratory phasicity. Hepatic Artery Velocity:  101 cm/sec Splenic Vein Velocity:  38 cm/sec Spleen: 5.6 cm x 6.9 cm x 5.0 cm with a total volume of 101 cm^3 (411 cm^3 is upper limit normal) Portal Vein Occlusion/Thrombus: No Splenic Vein Occlusion/Thrombus: No Ascites: Trace ascites in the upper abdomen Varices: None Patent portal, hepatic and splenic veins with normal directional flow. Negative for portal vein occlusion or thrombus. Trace abdominal upper abdominal ascites IMPRESSION: Normal hepatic venous Doppler. Trace upper abdominal ascites Electronically Signed   By: Jerilynn Mages.  Shick M.D.   On: 06/15/2021 11:00   ECHOCARDIOGRAM LIMITED  Result Date: 06/11/2021    ECHOCARDIOGRAM LIMITED REPORT   Patient Name:   Collin Young Date of Exam: 06/11/2021 Medical Rec #:  [id number]        Height:       66.0 in Accession #:    [id number]       Weight:       136.7 lb Date of Birth:  [birth year]        BSA:          1.701 m Patient Age:    3 years         BP:           137/87 mmHg Patient Gender: M                HR:           104 bpm. Exam Location:  Inpatient Procedure: Limited Echo, Limited Color Doppler and Cardiac Doppler Indications:    CHF-Acute Systolic F64.33  History:        Patient has prior history of Echocardiogram examinations, most                 recent 06/02/2021. CHF and Idiopathic CMP, CAD, COPD; Risk                 Factors:ETOH, Dyslipidemia and Hypertension.  Sonographer:    Bernadene Person RDCS Referring Phys: [id number] [place]  1. Limited study no complete doppler/color flow. EF 20% global hypokinesis RV normal size and function Moderate LAE, mild RAE. IVC dilated Moderate TR mild AR with AV sclerosis ? pleural effusion no pericardial effusion.  2.  Left ventricular ejection fraction, by estimation, is 20%. The left ventricle has severely decreased function.  The left ventricle has no regional wall motion abnormalities. The left ventricular internal cavity size was severely dilated. Left ventricular diastolic function could not be evaluated.  3. Right ventricular systolic function is normal. The right ventricular size is normal. There is mildly elevated pulmonary artery systolic pressure.  4. Left atrial size was moderately dilated.  5. Right atrial size was mildly dilated.  6. Moderate pleural effusion in the left lateral region.  7. The mitral valve was not assessed. No evidence of mitral valve regurgitation. No evidence of mitral stenosis.  8. Tricuspid valve regurgitation is moderate.  9. The aortic valve is abnormal. Aortic valve regurgitation is mild. Mild to moderate aortic valve sclerosis/calcification is present, without any evidence of aortic stenosis. 10. The inferior vena cava is dilated in size with >50% respiratory variability, suggesting right atrial pressure of 8 mmHg. FINDINGS  Left Ventricle: Left ventricular ejection fraction, by estimation, is 20%. The left ventricle has severely decreased function. The left ventricle has no regional wall motion abnormalities. The left ventricular internal cavity size was severely dilated. There is no left ventricular hypertrophy. Left ventricular diastolic function could not be evaluated. Right Ventricle: The right ventricular size is normal. No increase in right ventricular wall thickness. Right ventricular systolic function is normal. There is mildly elevated pulmonary artery systolic pressure. The tricuspid regurgitant velocity is 3.08  m/s, and with an assumed right atrial pressure of 3 mmHg, the estimated right ventricular systolic pressure is 77.8 mmHg. Left Atrium: Left atrial size was moderately dilated. Right Atrium: Right atrial size was mildly dilated. Pericardium: There is no evidence of  pericardial effusion. Mitral Valve: The mitral valve was not assessed. No evidence of mitral valve stenosis. Tricuspid Valve: The tricuspid valve is normal in structure. Tricuspid valve regurgitation is moderate . No evidence of tricuspid stenosis. Aortic Valve: The aortic valve is abnormal. Aortic valve regurgitation is mild. Mild to moderate aortic valve sclerosis/calcification is present, without any evidence of aortic stenosis. Pulmonic Valve: The pulmonic valve was normal in structure. Pulmonic valve regurgitation is not visualized. No evidence of pulmonic stenosis. Aorta: The aortic root is normal in size and structure and aortic root could not be assessed. Venous: The inferior vena cava is dilated in size with greater than 50% respiratory variability, suggesting right atrial pressure of 8 mmHg. IAS/Shunts: The interatrial septum was not assessed. Additional Comments: Limited study no complete doppler/color flow. EF 20% global hypokinesis RV normal size and function Moderate LAE, mild RAE. IVC dilated Moderate TR mild AR with AV sclerosis ? pleural effusion no pericardial effusion. There is a moderate pleural effusion in the left lateral region. RIGHT VENTRICLE RV S prime:     7.51 cm/s TAPSE (M-mode): 1.3 cm RIGHT ATRIUM           Index RA Area:     17.00 cm RA Volume:   48.50 ml  28.51 ml/m  TRICUSPID VALVE TR Peak grad:   37.9 mmHg TR Vmax:        308.00 cm/s Jenkins Rouge MD Electronically signed by Jenkins Rouge MD Signature Date/Time: 06/11/2021/2:48:25 PM    Final    [place] EKG SITE RITE  Result Date: 07/08/2021 If Site Rite image not attached, placement could not be confirmed due to current cardiac rhythm.  [place] EKG SITE RITE  Result Date: 06/19/2021 If Site Rite image not attached, placement could not be confirmed due to current cardiac rhythm.  [place] EKG SITE RITE  Result Date: 06/19/2021 If Site Rite image not  attached, placement could not be confirmed due to current cardiac rhythm.  US  Abdomen Limited RUQ (LIVER/GB)  Result Date: 06/15/2021 CLINICAL DATA:  Elevated LFTs EXAM: ULTRASOUND ABDOMEN LIMITED RIGHT UPPER QUADRANT COMPARISON:  None. FINDINGS: Gallbladder: Thickened gallbladder wall measuring 1 cm. This may related to chronic hepatic disease, hypoproteinemia or CHF. No elicited Murphy's sign or pericholecystic fluid. No gallstones. Common bile duct: Diameter: 3.8 mm Liver: No focal lesion identified. Within normal limits in parenchymal echogenicity. Portal vein is patent on color Doppler imaging with normal direction of blood flow towards the liver. Other: None. IMPRESSION: Nonspecific gallbladder wall thickening.  See above comment. No definitive signs of cholecystitis No biliary dilatation Electronically Signed   By: Jerilynn Mages.  Shick M.D.   On: 06/15/2021 11:02     Discharge Exam: Vitals:   07/09/21 0830 07/09/21 1120  BP: 115/69 (!) 92/58  Pulse: 86 81  Resp: 16 17  Temp: 97.9 F (36.6 C) 98.2 F (36.8 C)  SpO2: 95% 96%   Vitals:   07/08/21 2007 07/09/21 0352 07/09/21 0830 07/09/21 1120  BP: 123/65 (!) 105/55 115/69 (!) 92/58  Pulse: 88 90 86 81  Resp: '18 18 16 17  ' Temp: 98.3 F (36.8 C) 98.5 F (36.9 C) 97.9 F (36.6 C) 98.2 F (36.8 C)  TempSrc: Oral Oral Oral Oral  SpO2: 95% 95% 95% 96%  Weight:  55.5 kg    Height:        General: Pt is alert, awake, not in acute distress Cardiovascular: RRR, S1/S2 +, no rubs, no gallops Respiratory: CTA bilaterally, no wheezing, no rhonchi Abdominal: Soft, NT, ND, bowel sounds + Extremities: no edema, no cyanosis    The results of significant diagnostics from this hospitalization (including imaging, microbiology, ancillary and laboratory) are listed below for reference.     Microbiology: Recent Results (from the past 240 hour(s))  Resp Panel by RT-PCR (Flu A&B, Covid) Nasopharyngeal Swab     Status: None   Collection Time: 07/07/21  2:37 AM   Specimen: Nasopharyngeal Swab; Nasopharyngeal(NP) swabs in vial  transport medium  Result Value Ref Range Status   SARS Coronavirus 2 by RT PCR NEGATIVE NEGATIVE Final    Comment: (NOTE) SARS-CoV-2 target nucleic acids are NOT DETECTED.  The SARS-CoV-2 RNA is generally detectable in upper respiratory specimens during the acute phase of infection. The lowest concentration of SARS-CoV-2 viral copies this assay can detect is 138 copies/mL. A negative result does not preclude SARS-Cov-2 infection and should not be used as the sole basis for treatment or other patient management decisions. A negative result may occur with  improper specimen collection/handling, submission of specimen other than nasopharyngeal swab, presence of viral mutation(s) within the areas targeted by this assay, and inadequate number of viral copies(<138 copies/mL). A negative result must be combined with clinical observations, patient history, and epidemiological information. The expected result is Negative.  Fact Sheet for Patients:  EntrepreneurPulse.com.au  Fact Sheet for Healthcare Providers:  IncredibleEmployment.be  This test is no t yet approved or cleared by the [place] FDA and  has been authorized for detection and/or diagnosis of SARS-CoV-2 by FDA under an Emergency Use Authorization (EUA). This EUA will remain  in effect (meaning this test can be used) for the duration of the COVID-19 declaration under Section 564(b)(1) of the Act, 21 U.S.C.section 360bbb-3(b)(1), unless the authorization is terminated  or revoked sooner.       Influenza A by PCR NEGATIVE NEGATIVE Final   Influenza B by PCR  NEGATIVE NEGATIVE Final    Comment: (NOTE) The Xpert Xpress SARS-CoV-2/FLU/RSV plus assay is intended as an aid in the diagnosis of influenza from Nasopharyngeal swab specimens and should not be used as a sole basis for treatment. Nasal washings and aspirates are unacceptable for Xpert Xpress SARS-CoV-2/FLU/RSV testing.  Fact  Sheet for Patients: EntrepreneurPulse.com.au  Fact Sheet for Healthcare Providers: IncredibleEmployment.be  This test is not yet approved or cleared by the [place] FDA and has been authorized for detection and/or diagnosis of SARS-CoV-2 by FDA under an Emergency Use Authorization (EUA). This EUA will remain in effect (meaning this test can be used) for the duration of the COVID-19 declaration under Section 564(b)(1) of the Act, 21 U.S.C. section 360bbb-3(b)(1), unless the authorization is terminated or revoked.  Performed at [facility], [place] [address] [place], [place] [address]      Labs: BNP (last 3 results) Recent Labs    06/02/21 0311 06/11/21 0225 07/06/21 2354  BNP 2,342.9* >4,500.0* >6,045.4*   Basic Metabolic Panel: Recent Labs  Lab 07/04/21 1131 07/06/21 2354 07/07/21 2110 07/08/21 0409 07/09/21 0329  NA 136 132* 131* 131* 130*  K 4.2 4.7 4.1 4.0 4.0  CL 98 96* 93* 94* 91*  CO2 '30 26 31 27 28  ' GLUCOSE 109* 130* 99 82 116*  BUN 14 26* '22 18 19  ' CREATININE 1.06 1.23 1.24 1.08 1.10  CALCIUM 9.1 9.3 8.5* 8.6* 8.6*  MG 2.0  --  2.1 2.0  --    Liver Function Tests: Recent Labs  Lab 07/03/21 1058 07/06/21 2354  AST 26 30  ALT 84* 56*  ALKPHOS 56 57  BILITOT 0.5 0.9  PROT 6.6 6.9  ALBUMIN 3.6 3.2*   No results for input(s): LIPASE, AMYLASE in the last 168 hours. No results for input(s): AMMONIA in the last 168 hours. CBC: Recent Labs  Lab 07/03/21 1058 07/04/21 1131 07/06/21 2354 07/08/21 0409 07/09/21 0329  WBC 4.6 4.9 5.3 4.5 7.0  NEUTROABS  --   --  3.5  --   --   HGB 11.3* 10.9* 11.7* 11.1* 12.3*  HCT 35.5* 36.0* 36.7* 35.2* 39.1  MCV 84.8 89.3 84.2 83.0 83.9  PLT 289.0 283 310 311 340   Cardiac Enzymes: No results for input(s): CKTOTAL, CKMB, CKMBINDEX, TROPONINI in the last 168 hours. BNP: Invalid input(s): POCBNP CBG: No results for input(s): GLUCAP in the last 168  hours. D-Dimer No results for input(s): DDIMER in the last 72 hours. Hgb A1c No results for input(s): HGBA1C in the last 72 hours. Lipid Profile No results for input(s): CHOL, HDL, LDLCALC, TRIG, CHOLHDL, LDLDIRECT in the last 72 hours. Thyroid function studies No results for input(s): TSH, T4TOTAL, T3FREE, THYROIDAB in the last 72 hours.  Invalid input(s): FREET3 Anemia work up No results for input(s): VITAMINB12, FOLATE, FERRITIN, TIBC, IRON, RETICCTPCT in the last 72 hours. Urinalysis    Component Value Date/Time   COLORURINE YELLOW 05/07/2021 2345   APPEARANCEUR HAZY (A) 05/07/2021 2345   APPEARANCEUR Clear 05/29/2020 1556   LABSPEC 1.023 05/07/2021 2345   PHURINE 5.0 05/07/2021 2345   GLUCOSEU NEGATIVE 05/07/2021 2345   HGBUR NEGATIVE 05/07/2021 2345   BILIRUBINUR NEGATIVE 05/07/2021 2345   BILIRUBINUR Negative 05/29/2020 1556   KETONESUR NEGATIVE 05/07/2021 2345   PROTEINUR 100 (A) 05/07/2021 2345   UROBILINOGEN 1.0 08/04/2014 2007   NITRITE NEGATIVE 05/07/2021 2345   LEUKOCYTESUR TRACE (A) 05/07/2021 2345   Sepsis Labs Invalid input(s): PROCALCITONIN,  WBC,  LACTICIDVEN Microbiology  Recent Results (from the past 240 hour(s))  Resp Panel by RT-PCR (Flu A&B, Covid) Nasopharyngeal Swab     Status: None   Collection Time: 07/07/21  2:37 AM   Specimen: Nasopharyngeal Swab; Nasopharyngeal(NP) swabs in vial transport medium  Result Value Ref Range Status   SARS Coronavirus 2 by RT PCR NEGATIVE NEGATIVE Final    Comment: (NOTE) SARS-CoV-2 target nucleic acids are NOT DETECTED.  The SARS-CoV-2 RNA is generally detectable in upper respiratory specimens during the acute phase of infection. The lowest concentration of SARS-CoV-2 viral copies this assay can detect is 138 copies/mL. A negative result does not preclude SARS-Cov-2 infection and should not be used as the sole basis for treatment or other patient management decisions. A negative result may occur with  improper  specimen collection/handling, submission of specimen other than nasopharyngeal swab, presence of viral mutation(s) within the areas targeted by this assay, and inadequate number of viral copies(<138 copies/mL). A negative result must be combined with clinical observations, patient history, and epidemiological information. The expected result is Negative.  Fact Sheet for Patients:  EntrepreneurPulse.com.au  Fact Sheet for Healthcare Providers:  IncredibleEmployment.be  This test is no t yet approved or cleared by the [place] FDA and  has been authorized for detection and/or diagnosis of SARS-CoV-2 by FDA under an Emergency Use Authorization (EUA). This EUA will remain  in effect (meaning this test can be used) for the duration of the COVID-19 declaration under Section 564(b)(1) of the Act, 21 U.S.C.section 360bbb-3(b)(1), unless the authorization is terminated  or revoked sooner.       Influenza A by PCR NEGATIVE NEGATIVE Final   Influenza B by PCR NEGATIVE NEGATIVE Final    Comment: (NOTE) The Xpert Xpress SARS-CoV-2/FLU/RSV plus assay is intended as an aid in the diagnosis of influenza from Nasopharyngeal swab specimens and should not be used as a sole basis for treatment. Nasal washings and aspirates are unacceptable for Xpert Xpress SARS-CoV-2/FLU/RSV testing.  Fact Sheet for Patients: EntrepreneurPulse.com.au  Fact Sheet for Healthcare Providers: IncredibleEmployment.be  This test is not yet approved or cleared by the [place] FDA and has been authorized for detection and/or diagnosis of SARS-CoV-2 by FDA under an Emergency Use Authorization (EUA). This EUA will remain in effect (meaning this test can be used) for the duration of the COVID-19 declaration under Section 564(b)(1) of the Act, 21 U.S.C. section 360bbb-3(b)(1), unless the authorization is terminated or revoked.  Performed at  [facility], [place] [address], [place], [place] [address]      Time coordinating discharge: Over 30 minutes  SIGNED:   Darliss Cheney, MD  [facility] 07/09/2021, 1:09 PM  If 7PM-7AM, please contact night-coverage [email or web address]

## 2021-07-10 ENCOUNTER — Telehealth: Payer: Self-pay

## 2021-07-10 LAB — FUNGUS CULTURE WITH STAIN

## 2021-07-10 LAB — FUNGUS CULTURE RESULT

## 2021-07-10 LAB — FUNGAL ORGANISM REFLEX

## 2021-07-10 NOTE — Telephone Encounter (Signed)
Transition Care Management Follow-up Telephone Call Date of discharge and from where: 07/09/2021-[facility] How have you been since you were released from the hospital? Doing pretty good per daughter Any questions or concerns? No  Items Reviewed: Did the pt receive and understand the discharge instructions provided? Yes  Medications obtained and verified? Yes  Other? Yes  Any new allergies since your discharge? No  Dietary orders reviewed? Yes Do you have support at home? No -Patient lives alone but daughter lives 5 min away & checks on him frequently.  Home Care and Equipment/Supplies: Were home health services ordered? no If so, what is the name of the agency? N/a  Has the agency set up a time to come to the patient's home? not applicable Were any new equipment or medical supplies ordered?  No What is the name of the medical supply agency? N/a Were you able to get the supplies/equipment? not applicable Do you have any questions related to the use of the equipment or supplies? N/a  Functional Questionnaire: (I = Independent and D = Dependent) ADLs: I  Bathing/Dressing- I  Meal Prep- I  Eating- I  Maintaining continence- I  Transferring/Ambulation- I  Managing Meds- I WITH ASSISTANCE  Follow up appointments reviewed:  PCP Hospital f/u appt confirmed? Yes  Scheduled to see Dr. Lorelei Pont on 07/17/2021 @ 2:00. [facility] f/u appt confirmed? Yes  Scheduled to see [facility] V[facility] on 07/15/2021 @ 10:00. Are transportation arrangements needed? No  If their condition worsens, is the pt aware to call PCP or go to the Emergency Dept.? Yes Was the patient provided with contact information for the PCP's office or ED? Yes Was to pt encouraged to call back with questions or concerns? Yes

## 2021-07-11 ENCOUNTER — Telehealth (HOSPITAL_COMMUNITY): Payer: Self-pay | Admitting: Pharmacist

## 2021-07-11 ENCOUNTER — Other Ambulatory Visit (HOSPITAL_COMMUNITY): Payer: Self-pay

## 2021-07-11 NOTE — Telephone Encounter (Signed)
Transitions of Care Pharmacy   Call attempted for a pharmacy transitions of care follow-up. HIPAA appropriate voicemail was left with call back information provided.   Call attempt #2. Will follow-up in 2-3 days.   Hiromi Knodel D. Donneta Romberg, PharmD, BCPS, [place]  [phone]

## 2021-07-14 ENCOUNTER — Inpatient Hospital Stay: Payer: Medicare Other

## 2021-07-14 ENCOUNTER — Inpatient Hospital Stay: Payer: Medicare Other | Admitting: Hematology & Oncology

## 2021-07-14 ENCOUNTER — Encounter (HOSPITAL_COMMUNITY): Payer: Medicare Other

## 2021-07-15 ENCOUNTER — Ambulatory Visit (HOSPITAL_COMMUNITY)
Admit: 2021-07-15 | Discharge: 2021-07-15 | Disposition: A | Discharge status: Home / Self Care | Payer: Medicare Other | Attending: Family Medicine | Admitting: Family Medicine

## 2021-07-15 ENCOUNTER — Encounter (HOSPITAL_COMMUNITY): Payer: Self-pay

## 2021-07-15 VITALS — BP 102/62 | HR 94 | Wt 126.0 lb

## 2021-07-15 DIAGNOSIS — E785 Hyperlipidemia, unspecified: Secondary | ICD-10-CM | POA: Insufficient documentation

## 2021-07-15 DIAGNOSIS — I5043 Acute on chronic combined systolic (congestive) and diastolic (congestive) heart failure: Secondary | ICD-10-CM

## 2021-07-15 DIAGNOSIS — I5022 Chronic systolic (congestive) heart failure: Secondary | ICD-10-CM | POA: Diagnosis not present

## 2021-07-15 DIAGNOSIS — I5042 Chronic combined systolic (congestive) and diastolic (congestive) heart failure: Secondary | ICD-10-CM | POA: Insufficient documentation

## 2021-07-15 DIAGNOSIS — Z9889 Other specified postprocedural states: Secondary | ICD-10-CM | POA: Insufficient documentation

## 2021-07-15 DIAGNOSIS — Z8501 Personal history of malignant neoplasm of esophagus: Secondary | ICD-10-CM | POA: Diagnosis not present

## 2021-07-15 DIAGNOSIS — Z7984 Long term (current) use of oral hypoglycemic drugs: Secondary | ICD-10-CM | POA: Diagnosis not present

## 2021-07-15 DIAGNOSIS — I130 Hypertensive heart and chronic kidney disease with heart failure and stage 1 through stage 4 chronic kidney disease, or unspecified chronic kidney disease: Secondary | ICD-10-CM | POA: Insufficient documentation

## 2021-07-15 DIAGNOSIS — Z85819 Personal history of malignant neoplasm of unspecified site of lip, oral cavity, and pharynx: Secondary | ICD-10-CM | POA: Insufficient documentation

## 2021-07-15 DIAGNOSIS — J449 Chronic obstructive pulmonary disease, unspecified: Secondary | ICD-10-CM | POA: Insufficient documentation

## 2021-07-15 DIAGNOSIS — Z7901 Long term (current) use of anticoagulants: Secondary | ICD-10-CM | POA: Diagnosis not present

## 2021-07-15 DIAGNOSIS — Z8709 Personal history of other diseases of the respiratory system: Secondary | ICD-10-CM | POA: Diagnosis not present

## 2021-07-15 DIAGNOSIS — Z9221 Personal history of antineoplastic chemotherapy: Secondary | ICD-10-CM | POA: Insufficient documentation

## 2021-07-15 DIAGNOSIS — N183 Chronic kidney disease, stage 3 unspecified: Secondary | ICD-10-CM | POA: Diagnosis not present

## 2021-07-15 DIAGNOSIS — I2510 Atherosclerotic heart disease of native coronary artery without angina pectoris: Secondary | ICD-10-CM | POA: Diagnosis not present

## 2021-07-15 DIAGNOSIS — Z8546 Personal history of malignant neoplasm of prostate: Secondary | ICD-10-CM | POA: Insufficient documentation

## 2021-07-15 DIAGNOSIS — Z923 Personal history of irradiation: Secondary | ICD-10-CM | POA: Insufficient documentation

## 2021-07-15 DIAGNOSIS — E871 Hypo-osmolality and hyponatremia: Secondary | ICD-10-CM | POA: Insufficient documentation

## 2021-07-15 DIAGNOSIS — R64 Cachexia: Secondary | ICD-10-CM | POA: Diagnosis not present

## 2021-07-15 DIAGNOSIS — J90 Pleural effusion, not elsewhere classified: Secondary | ICD-10-CM | POA: Diagnosis not present

## 2021-07-15 DIAGNOSIS — I255 Ischemic cardiomyopathy: Secondary | ICD-10-CM | POA: Diagnosis not present

## 2021-07-15 DIAGNOSIS — N1831 Chronic kidney disease, stage 3a: Secondary | ICD-10-CM | POA: Diagnosis not present

## 2021-07-15 DIAGNOSIS — I2699 Other pulmonary embolism without acute cor pulmonale: Secondary | ICD-10-CM | POA: Diagnosis not present

## 2021-07-15 DIAGNOSIS — Z79899 Other long term (current) drug therapy: Secondary | ICD-10-CM | POA: Insufficient documentation

## 2021-07-15 LAB — DIGOXIN LEVEL: Digoxin Level: 0.8 ng/mL (ref 0.8–2.0)

## 2021-07-15 LAB — CBC
HCT: 44.3 % (ref 39.0–52.0)
Hemoglobin: 13.8 g/dL (ref 13.0–17.0)
MCH: 26.2 pg (ref 26.0–34.0)
MCHC: 31.2 g/dL (ref 30.0–36.0)
MCV: 84.2 fL (ref 80.0–100.0)
Platelets: 326 10*3/uL (ref 150–400)
RBC: 5.26 MIL/uL (ref 4.22–5.81)
RDW: 15.9 % — ABNORMAL HIGH (ref 11.5–15.5)
WBC: 5.1 10*3/uL (ref 4.0–10.5)
nRBC: 0 % (ref 0.0–0.2)

## 2021-07-15 LAB — BASIC METABOLIC PANEL
Anion gap: 7 (ref 5–15)
BUN: 21 mg/dL (ref 8–23)
CO2: 30 mmol/L (ref 22–32)
Calcium: 9 mg/dL (ref 8.9–10.3)
Chloride: 94 mmol/L — ABNORMAL LOW (ref 98–111)
Creatinine, Ser: 1.23 mg/dL (ref 0.61–1.24)
GFR, Estimated: >60 mL/min (ref >60)
Glucose, Bld: 101 mg/dL — ABNORMAL HIGH (ref 70–99)
Potassium: 4.3 mmol/L (ref 3.5–5.1)
Sodium: 131 mmol/L — ABNORMAL LOW (ref 135–145)

## 2021-07-15 NOTE — Progress Notes (Signed)
ReDS Vest / Clip - 07/15/21 1000       ReDS Vest / Clip   Station Marker C    Ruler Value 24    ReDS Value Range Low volume    ReDS Actual Value 34

## 2021-07-15 NOTE — Progress Notes (Signed)
ADVANCED HF CLINIC NOTE PCP: Copland, Gay Filler, MD HF Cardiologist: Dr. Haroldine Laws  HPI:   Collin Young is a 70 y.o.male w/ chronic combined systolic and diastolic CHF/ ischemic CM, severe multivessel CAD treated medically, 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     HF dates back to 12/2016. Echo then w/ severely reduced LVEF 10-15%, RV mild-mod reduced, mod AI/MR/TR. LHC showed severe calcific disease in the proximal to mid RCA and chronic total occlusion of the proximal circumflex.There was consideration of RCA PCI.  However, this would have required hemodynamic support and was deferred given his frailty and comorbidities. CAD managed medically.    Repeat Echo 05/2017, EF improved up to 45-50%, Mod AI. RV normal.     Echo 05/2020 EF down again, 35-40%. RV normal    Admitted to [facility] 9/22 w/ a/c CHF. Echo EF down 25-30%, RV not well visualized. Mod MR/AI. Chest CT showed  large right pleural effusion.  There was questionable PE on his CT but ultimately this was thought to be just artifact with poor opacification. Given w/ IV Lasix and discharged home on Entresto, Farxiga, Toprol XL and PRN lasix.    Presented back to [facility] ED on 06/11/21 via EMS w/ acute SOB and hypoxic respiratory failure. Repeat chest CT showed rt lower lobe PE + large rt pleural effusion and small left pleural effusion. Underwent Rt Thoracentesis, fluid analysis c/w transudative effusion. Heparin started for PE, eventually switched to Eliquis. Cardiology consulted for a/c CHF. Started on IV Lasix for diuresis. Limited Echo repeated, EF 20%, global HK, RV normal. Developed signs of low output and transferred to [facility] for RHC, which showed cardiogenic shock with R>L heart failure. Milrinone started. GDMT titrated and able to wean milrinone off. Not felt to be a VAD candidate due to cachexia and RV failure. Discharged home, weight 121 lbs.   Had post  hospital f/u 07/04/21. Doing ok w/ NYHA Class II symptoms. Wt was up 4 lb post hospital and ReDs clip was elevated at 36%. Entresto was increased to 49-51 bid and Lasix was added 2 days a week, qMon + qFri. Digoxin was also reduced to 0.0625 due to elevated level (1.0).    Admitted 07/07/21 with acute on chronic CHF. Admitted and started on IV Lasix. Repeat echo showed ED 25-30%, severe LV dysfunction, grade I DD, RV mildly reduced.  Transitioned to po lasix 40 mg daily. Continued on GDMT, discharge weight 122 lbs. Will need formal assessment to determine VAD candidacy but suspect limited due to RV failure and cachexia.  Today he returns for post hospitalization HF follow up. Overall feeling fine. He is able to walk and go upstairs without SOB. Denies CP, dizziness, abnormal bleeding, palpitations, edema, or PND/Orthopnea. Appetite ok. No fever or chills. Weight at home 117 pounds. Taking all medications. Staying away from salty foods.   Cardiac Studies Echo 10/22 EF 20%, global HK, RV mildly reduced Echo 11/22 EF 25-30%, global HK, RV mildly reduced.  Past Medical History:  Diagnosis Date   Chronic combined systolic and diastolic CHF (congestive heart failure) ([place])    followed by [name]   COPD (chronic obstructive pulmonary disease) ([place])    Coronary artery disease cardiologist-- [name]   per cardiac cath 01-05-2017  chronic total occlusion pLCx with collaterals, 99% severe calcified prox. to mid RCA, otherwise mild to moderate CAD (medically managed)   Esophageal cancer,  stage IIIB [facility]) oncologist-  dr ennever/  dr moody   dx 2010  SCC Stage IIIB completed chemoradiation;  localized recurrent left piriform sinus 01/ 2015,  completed concurrent chemoradiatoin 04/ 2015   GERD (gastroesophageal reflux disease)    09-07-2018   no issues since Gtube removed 06/ 2019   History of alcohol abuse    quit 2001   History of cancer chemotherapy    2010;   2015   History of  radiation therapy    10-19-2013 to  12-05-2013 pyriform sinus 69.96 Gy/70fx;   Radiation completed 2010 for esophageal cancer   History of seizure 2001   alcohol withdrawal   HOH (hard of hearing)    Hyperlipidemia    Hypertension    Ischemic cardiomyopathy 12/2016   01-03-2017  echo,  ef 10-15%/   echo 05-06-2017 EF improved to 45-50%   Prostate cancer [facility]) urologist-  dr ottelin/  oncologist-- [name]   dx 05-27-2018--- Stage T1c,  Gleason 4+3,  PSA 9.3--  scheduled for brachytherapy 09-23-2017   Renal cyst, left     Current Outpatient Medications  Medication Sig Dispense Refill   albuterol (VENTOLIN HFA) 108 (90 Base) MCG/ACT inhaler INHALE 2 PUFFS INTO THE LUNGS EVERY 6 HOURS AS NEEDED FOR WHEEZING OR SHORTNESS OF BREATH 20.1 g 1   apixaban (ELIQUIS) 5 MG TABS tablet Take 1 tablet (5 mg total) by mouth 2 (two) times daily. 60 tablet 0   BAYER LOW DOSE 81 MG EC tablet Take 81 mg by mouth daily. Swallow whole.     dapagliflozin propanediol (FARXIGA) 10 MG TABS tablet Take 1 tablet (10 mg total) by mouth daily. 30 tablet 2   digoxin 62.5 MCG TABS Take 0.0625 mg by mouth daily. 30 tablet 0   Evolocumab (REPATHA SURECLICK) 094 MG/ML SOAJ Inject 140 mg into the skin every 14 (fourteen) days. 2 mL 11   fentaNYL (DURAGESIC) 12 MCG/HR Place 1 patch onto the skin every 3 (three) days. 10 patch 0   furosemide (LASIX) 40 MG tablet Take 1 tablet (40 mg total) by mouth daily. 30 tablet 0   hydrALAZINE (APRESOLINE) 25 MG tablet Take 1.5 tablets (37.5 mg total) by mouth every 8 (eight) hours. 135 tablet 0   ipratropium (ATROVENT) 0.03 % nasal spray Place 2 sprays into both nostrils every 12 (twelve) hours. 30 mL 12   isosorbide mononitrate (IMDUR) 30 MG 24 hr tablet Take 1 tablet (30 mg total) by mouth daily. 30 tablet 0   ivabradine (CORLANOR) 5 MG TABS tablet Take 1/2 tablet (2.5 mg total) by mouth 2 (two) times daily with a meal. 30 tablet 0   Multiple Vitamin (MULTIVITAMIN) tablet Take 1  tablet by mouth 3 (three) times a week.     polyethylene glycol (MIRALAX / GLYCOLAX) 17 g packet Take 17 g by mouth daily as needed for mild constipation. 14 each 0   pravastatin (PRAVACHOL) 80 MG tablet Take 1 tablet (80 mg total) by mouth every evening. 90 tablet 3   sacubitril-valsartan (ENTRESTO) 49-51 MG Take 1 tablet by mouth 2 (two) times daily. 60 tablet 11   spironolactone (ALDACTONE) 25 MG tablet Take 1 tablet (25 mg total) by mouth daily. 30 tablet 0   No current facility-administered medications for this encounter.   Facility-Administered Medications Ordered in Other Encounters  Medication Dose Route Frequency Provider Last Rate Last Admin   topical emolient (BIAFINE) emulsion   Topical Daily Kyung Rudd, MD   Given at 01/19/14 [phone]  No Known Allergies  Social History   Socioeconomic History   Marital status: Legally Separated    Spouse name: Not on file   Number of children: 2   Years of education: Not on file   Highest education level: Not on file  Occupational History   Occupation: retired  Tobacco Use   Smoking status: Former    Packs/day: 1.00    Years: 40.00    Pack years: 40.00    Types: Cigarettes    Start date: 11/08/1960    Quit date: 08/31/2009    Years since quitting: 11.8   Smokeless tobacco: Former    Types: Chew    Quit date: 09/01/1999  Vaping Use   Vaping Use: Never used  Substance and Sexual Activity   Alcohol use: Not Currently    Alcohol/week: 0.0 standard drinks    Comment: quit in 2001   Drug use: No    Comment: back in the day used cocaine,alcohol, marijuana   Sexual activity: Yes    Partners: Female    Birth control/protection: None  Other Topics Concern   Not on file  Social History Narrative   Not on file   Social Determinants of Health   Financial Resource Strain: Not on file  Food Insecurity: Not on file  Transportation Needs: Not on file  Physical Activity: Not on file  Stress: Not on file  Social Connections: Not on file   Intimate Partner Violence: Not on file   Family History  Problem Relation Age of Onset   Breast cancer Mother    Prostate cancer Neg Hx    Colon cancer Neg Hx    Pancreatic cancer Neg Hx    BP 102/62   Pulse 94   Wt 57.2 kg (126 lb)   SpO2 98%   BMI 20.34 kg/m   Wt Readings from Last 3 Encounters:  07/15/21 57.2 kg (126 lb)  07/09/21 55.5 kg (122 lb 4.8 oz)  07/04/21 61.8 kg (136 lb 3.2 oz)   PHYSICAL EXAM: General:  NAD. No resp difficulty, thin, walked into clinic HEENT: Hoarse, [place] bilateral hearing aids. Neck: Supple. No JVD. Carotids 2+ bilat; no bruits. No lymphadenopathy or thryomegaly appreciated. Cor: PMI nondisplaced. Regular rate & rhythm. No rubs, gallops or murmurs. Lungs: Clear Abdomen: Soft, nontender, nondistended. No hepatosplenomegaly. No bruits or masses. Good bowel sounds. Extremities: No cyanosis, clubbing, rash, edema Neuro: Alert & oriented x 3, cranial nerves grossly intact. Moves all 4 extremities w/o difficulty. Affect pleasant.  ECG: SR with PACs 96 bpm (personally reviewed). ReDs: 34%  ASSESSMENT & PLAN:  1.  Chronic Systolic Heart Failure  - Echo 2018 EF 10-15%, RV moderately reduced. LHC w/ MVCAD management medically - Echo 05/2020 EF down again, 35-40%. RV normal  - Echo 05/2021 EF down 25-30%, RV not well visualized. Mod MR and Mod AI. - Limited Echo 10/22: EF 20%, RV normal  - Recent admit 10/22 w/ a/c CHF w/ cardiogenic shock w/ AKI + Shock liver - RHC 06/16/21 w/ R>L HF and low output, CI 1.8. PAPi 1.3. Required milrinone but improved and able to wean off - Echo 11/22: EF 25-30%, global HK, RV mildly reduced. - Better NYHA II symptoms, volume looks good today, ReDs 34%.  - Continue Lasix 40 mg daily.  - Continue Entresto 49/51 mg bid. - Continue hydral 37.5 mg tid + Imdur 30 mg daily.  - Continue spiro 25 mg daily.  - Continue Farxiga 10 mg daily. - Continue ivabradine 2.5 mg  bid. HR 94 today. - Continue Digoxin 0.625 (on reduced  dose due to elevated level)  - Not felt to be VAD candidate due to cachexia and RV failure. Can reconsider as he improves. May need another RHC at some point. - BMET and digoxin level today   2. Pulmonary Embolism  - Diagnosed 10/22 - Limited echo w/ no RV strain  - Suspect incidental due HF - On Eliquis 5 mg bid, reports full compliance  - CBC today.   3. CKD Stage IIIa - On SGLT2i - BMET today.   4. H/o Pleural Effusion, right - s/p thoracentesis by CCM 06/11/21 - 1200 cc removed, transudative  - CXR this admit w/ bilateral pleural effusions, at least moderate on the right - Dyspnea improved with diuresis   5. H/o esophageal CA and throat CA - s/p chemo and XRT.   6. H/o Hyponatremia - Likely hypervolemic - Fluid restrict - BMET today.   Follow up with PharmD as scheduled (increase Entresto if able, may need increase in ivabradine as well), and APP in 6 weeks.  Allena Katz, FNP-BC 07/15/21

## 2021-07-15 NOTE — Progress Notes (Signed)
[name] [address], [place], [place] [address] [phone]  Date:  07/17/2021   Name:  Collin Young   DOB:  [birth year]   MRN:  [id number]  PCP:  Collin Mclean, MD    Chief Complaint: hospital follow up CHF   History of Present Illness:  Collin Young is a 70 y.o. very pleasant male patient who presents with the following:  Pt is seen today for hospital follow-up - unfortunate he has been admitted several times recently with CHF  Admit date: 07/06/2021 Discharge date: 07/09/2021 Brief/Interim Summary: Collin Young is a pleasant 69 y.o. male with medical history significant for chronic systolic CHF (EF < 05%), PE on Eliquis, esophageal cancer status post chemoradiation, presented to the ED on evening of 07/06/21 with worsening shortness of breath x 2 days. Also reported a mild nonproductive cough, no chest pain or leg swelling, no fevers or chills.  Reported strict adherence with his medications. Of note, pt admitted 4 weeks ago with cardiogenic shock and shock liver, found to have acute PE and was started on Eliquis.  Hospital course was complicated by AKI and hyponatremia which improved with tolvaptan and fluid restriction and had normalized by time of discharge follow-up. He followed up with cardiology on 07/04/2021, was found to have mildly elevated volume status and started on twice weekly Lasix at that time. Chest xray showed bibasilar volume loss with bilateral pleural effusions and atelectasis. Similar findings to prior exams. Cardiomegaly. BNP was > 4500.  Admitted to [facility] service with a diagnosis of acute on chronic systolic CHF and started on IV diuresis, heart failure team consulted-EF less than 20%.  He has improved, seen and cleared by cardiology with recommendations to discharge on Lasix 40 mg p.o. daily and resuming rest of the home medications Pulmonary embolism -diagnosed during admission last  month. --Continue Eliquis Hyponatremia -mild, present with sodium 132 in the setting of hypervolemia.  During recent admission, sodium was low at 125 but improved after tolvaptan and fluid restriction.  Currently 130.  This is close to his baseline. COPD -not acutely exacerbated, no wheezing on exam --Continue as needed albuterol   Discharge Diagnoses:  Principal Problem:   Acute on chronic systolic CHF (congestive heart failure) (HCC) Active Problems:   Pulmonary embolism (HCC)   COPD (chronic obstructive pulmonary disease) (HCC)   Hyponatremia  Wt Readings from Last 3 Encounters:  07/17/21 123 lb 6.4 oz (56 kg)  07/15/21 126 lb (57.2 kg)  07/09/21 122 lb 4.8 oz (55.5 kg)   He was already seen for hospital follow-up by cardiology on 11/15-  Today he returns for post hospitalization HF follow up. Overall feeling fine. He is able to walk and go upstairs without SOB. Denies CP, dizziness, abnormal bleeding, palpitations, edema, or PND/Orthopnea. Appetite ok. No fever or chills. Weight at home 117 pounds. Taking all medications. Staying away from salty foods. ASSESSMENT & PLAN:  1.  Chronic Systolic Heart Failure  - Echo 2018 EF 10-15%, RV moderately reduced. LHC w/ MVCAD management medically - Echo 05/2020 EF down again, 35-40%. RV normal  - Echo 05/2021 EF down 25-30%, RV not well visualized. Mod MR and Mod AI. - Limited Echo 10/22: EF 20%, RV normal  - Recent admit 10/22 w/ a/c CHF w/ cardiogenic shock w/ AKI + Shock liver - RHC 06/16/21 w/ R>L HF and low output, CI 1.8. PAPi 1.3. Required milrinone but improved  and able to wean off - Echo 11/22: EF 25-30%, global HK, RV mildly reduced. - Better NYHA II symptoms, volume looks good today, ReDs 34%.  - Continue Lasix 40 mg daily.  - Continue Entresto 49/51 mg bid. - Continue hydral 37.5 mg tid + Imdur 30 mg daily.  - Continue spiro 25 mg daily.  - Continue Farxiga 10 mg daily. - Continue ivabradine 2.5 mg bid. HR 94 today. - Continue  Digoxin 0.625 (on reduced dose due to elevated level)  - Not felt to be VAD candidate due to cachexia and RV failure. Can reconsider as he improves. May need another RHC at some point. - BMET and digoxin level today 2. Pulmonary Embolism  - Diagnosed 10/22 - Limited echo w/ no RV strain  - Suspect incidental due HF - On Eliquis 5 mg bid, reports full compliance  - CBC today. 3. CKD Stage IIIa - On SGLT2i - BMET today. 4. H/o Pleural Effusion, right - s/p thoracentesis by CCM 06/11/21 - 1200 cc removed, transudative  - CXR this admit w/ bilateral pleural effusions, at least moderate on the right - Dyspnea improved with diuresis 5. H/o esophageal CA and throat CA - s/p chemo and XRT. 6. H/o Hyponatremia - Likely hypervolemic - Fluid restrict - BMET today.    Collin Young notes that he is feeling ok- he feels like his throat is a bit sore but overall he is doing well No SOB His energy level is improving  Patient Active Problem List   Diagnosis Date Noted   Acute on chronic systolic CHF (congestive heart failure) ([place]) 07/07/2021   COPD (chronic obstructive pulmonary disease) (HCC)    Hyponatremia    SOB (shortness of breath)    DNR (do not resuscitate)    Weakness generalized    Elevated LFTs    Pulmonary embolism ([place]) 06/11/2021   Acute on chronic heart failure ([place]) 06/02/2021   GERD without esophagitis 05/08/2021   Mixed hyperlipidemia 05/08/2021   Hypertensive urgency 05/08/2021   Elevated troponin level not due myocardial infarction 05/08/2021   Pure hypercholesterolemia 09/23/2020   Claudication in peripheral vascular disease ([place]) 01/25/2020   Late latent syphilis 01/15/2020   Coronary artery disease involving native coronary artery of native heart 07/05/2019   Chronic kidney disease, stage 3a ([place]) 07/05/2019   Combined systolic and diastolic heart failure ([place]) 09/26/2018   Malignant neoplasm of prostate ([place]) 06/17/2018   Medication management    Dysphagia     Palliative care by specialist    Ischemic cardiomyopathy    Congestive dilated cardiomyopathy ([place])    Acute on chronic systolic congestive heart failure ([place]) 01/02/2017   Malignant neoplasm of pyriform sinus ([place]) 09/28/2013   Piriform sinus tumor 09/24/2013   NONSPECIFIC ABN FINDING RAD & OTH EXAM GI TRACT 05/14/2009    Past Medical History:  Diagnosis Date   Chronic combined systolic and diastolic CHF (congestive heart failure) ([place])    followed by [name]   COPD (chronic obstructive pulmonary disease) ([place])    Coronary artery disease cardiologist-- [name]   per cardiac cath 01-05-2017  chronic total occlusion pLCx with collaterals, 99% severe calcified prox. to mid RCA, otherwise mild to moderate CAD (medically managed)   Esophageal cancer, stage IIIB [facility]) oncologist-  dr ennever/  dr moody   dx 2010  SCC Stage IIIB completed chemoradiation;  localized recurrent left piriform sinus 01/ 2015,  completed concurrent chemoradiatoin 04/ 2015   GERD (gastroesophageal reflux disease)  09-07-2018   no issues since Gtube removed 06/ 2019   History of alcohol abuse    quit 2001   History of cancer chemotherapy    2010;   2015   History of radiation therapy    10-19-2013 to  12-05-2013 pyriform sinus 69.96 Gy/58fx;   Radiation completed 2010 for esophageal cancer   History of seizure 2001   alcohol withdrawal   HOH (hard of hearing)    Hyperlipidemia    Hypertension    Ischemic cardiomyopathy 12/2016   01-03-2017  echo,  ef 10-15%/   echo 05-06-2017 EF improved to 45-50%   Prostate cancer [facility]) urologist-  dr ottelin/  oncologist-- [name]   dx 05-27-2018--- Stage T1c,  Gleason 4+3,  PSA 9.3--  scheduled for brachytherapy 09-23-2017   Renal cyst, left     Past Surgical History:  Procedure Laterality Date   CARDIOVASCULAR STRESS TEST  10/09/09   normal nuclearr stress test, EF 57% [name])   CENTRAL LINE INSERTION  06/16/2021   Procedure: CENTRAL LINE  INSERTION;  Surgeon: Jolaine Artist, MD;  Location: [facility];  Service: Cardiovascular;;   IR GASTROSTOMY TUBE MOD SED  01/13/2017   IR GASTROSTOMY TUBE REMOVAL  02/03/2018   IR PATIENT EVAL TECH 0-60 MINS  03/25/2017   IR REMOVAL TUN ACCESS W/ PORT W/O FL MOD SED  01/15/2017   IR REPLACE G-TUBE SIMPLE WO FLUORO  01/13/2018   LARYNGOSCOPY N/A 09/15/2013   Procedure: LARYNGOSCOPY;  Surgeon: [name], MD;  Location: [facility];  Service: ENT;  Laterality: N/A;  direct laryngoscopy with biopsy and esophagoscopy   RADIOACTIVE SEED IMPLANT N/A 09/23/2018   Procedure: RADIOACTIVE SEED IMPLANT/BRACHYTHERAPY IMPLANT;  Surgeon: [name], MD;  Location: [facility];  Service: Urology;  Laterality: N/A;   RIGHT HEART CATH N/A 06/16/2021   Procedure: RIGHT HEART CATH;  Surgeon: Jolaine Artist, MD;  Location: [place];  Service: Cardiovascular;  Laterality: N/A;   RIGHT/LEFT HEART CATH AND CORONARY ANGIOGRAPHY N/A 01/05/2017   Procedure: Right/Left Heart Cath and Coronary Angiography;  Surgeon: [name], MD;  Location: [facility];  Service: Cardiovascular;  Laterality: N/A;   TRANSTHORACIC ECHOCARDIOGRAM  05/06/2017   ef 45-50%,  grade 1 diastolic dysfunction/  AV severe calcified non coronary cusp with moderate regurg. , no stenosis (valve area per echo 01-05-2017 1.08cm^2)/  mild MR, TR, and PR/  mild LAE    Social History   Tobacco Use   Smoking status: Former    Packs/day: 1.00    Years: 40.00    Pack years: 40.00    Types: Cigarettes    Start date: 11/08/1960    Quit date: 08/31/2009    Years since quitting: 11.8   Smokeless tobacco: Former    Types: Chew    Quit date: 09/01/1999  Vaping Use   Vaping Use: Never used  Substance Use Topics   Alcohol use: Not Currently    Alcohol/week: 0.0 standard drinks    Comment: quit in 2001   Drug use: No    Comment: back in the day used cocaine,alcohol, marijuana    Family History   Problem Relation Age of Onset   Breast cancer Mother    Prostate cancer Neg Hx    Colon cancer Neg Hx    Pancreatic cancer Neg Hx     No Known Allergies  Medication list has been reviewed and updated.  Current Outpatient Medications on File Prior  to Visit  Medication Sig Dispense Refill   albuterol (VENTOLIN HFA) 108 (90 Base) MCG/ACT inhaler INHALE 2 PUFFS INTO THE LUNGS EVERY 6 HOURS AS NEEDED FOR WHEEZING OR SHORTNESS OF BREATH 20.1 g 1   apixaban (ELIQUIS) 5 MG TABS tablet Take 1 tablet (5 mg total) by mouth 2 (two) times daily. 60 tablet 0   BAYER LOW DOSE 81 MG EC tablet Take 81 mg by mouth daily. Swallow whole.     dapagliflozin propanediol (FARXIGA) 10 MG TABS tablet Take 1 tablet (10 mg total) by mouth daily. 30 tablet 2   digoxin 62.5 MCG TABS Take 0.0625 mg by mouth daily. 30 tablet 0   Evolocumab (REPATHA SURECLICK) 893 MG/ML SOAJ Inject 140 mg into the skin every 14 (fourteen) days. 2 mL 11   fentaNYL (DURAGESIC) 12 MCG/HR Place 1 patch onto the skin every 3 (three) days. 10 patch 0   furosemide (LASIX) 40 MG tablet Take 1 tablet (40 mg total) by mouth daily. 30 tablet 0   hydrALAZINE (APRESOLINE) 25 MG tablet Take 1.5 tablets (37.5 mg total) by mouth every 8 (eight) hours. 135 tablet 0   ipratropium (ATROVENT) 0.03 % nasal spray Place 2 sprays into both nostrils every 12 (twelve) hours. 30 mL 12   isosorbide mononitrate (IMDUR) 30 MG 24 hr tablet Take 1 tablet (30 mg total) by mouth daily. 30 tablet 0   ivabradine (CORLANOR) 5 MG TABS tablet Take 1/2 tablet (2.5 mg total) by mouth 2 (two) times daily with a meal. 30 tablet 0   Multiple Vitamin (MULTIVITAMIN) tablet Take 1 tablet by mouth 3 (three) times a week.     polyethylene glycol (MIRALAX / GLYCOLAX) 17 g packet Take 17 g by mouth daily as needed for mild constipation. 14 each 0   pravastatin (PRAVACHOL) 80 MG tablet Take 1 tablet (80 mg total) by mouth every evening. 90 tablet 3   sacubitril-valsartan (ENTRESTO)  49-51 MG Take 1 tablet by mouth 2 (two) times daily. 60 tablet 11   spironolactone (ALDACTONE) 25 MG tablet Take 1 tablet (25 mg total) by mouth daily. 30 tablet 0   Current Facility-Administered Medications on File Prior to Visit  Medication Dose Route Frequency Provider Last Rate Last Admin   topical emolient (BIAFINE) emulsion   Topical Daily Kyung Rudd, MD   Given at 01/19/14 7342    Review of Systems:  As per HPI- otherwise negative.   Physical Examination: Vitals:   07/17/21 1403  BP: 118/60  Pulse: 91  Resp: 12  Temp: 98.1 F (36.7 C)  SpO2: 96%   Vitals:   07/17/21 1403  Weight: 123 lb 6.4 oz (56 kg)  Height: 5\' 6"  (1.676 m)   Body mass index is 19.92 kg/m. Ideal Body Weight: Weight in (lb) to have BMI = 25: 154.6  GEN: no acute distress. Thin build, appears his normal self  HEENT: Atraumatic, Normocephalic.  Oropharynx wnl   Ears and Nose: No external deformity. CV: RRR, No M/G/R. No JVD. No thrill. No extra heart sounds. PULM: CTA B, no wheezes, crackles, rhonchi. No retractions. No resp. distress. No accessory muscle use. EXTR: No c/c/e PSYCH: Normally interactive. Conversant.   Results for orders placed or performed in visit on 07/17/21  POCT rapid strep A  Result Value Ref Range   Rapid Strep A Screen Negative Negative    Assessment and Plan: Pharyngitis, unspecified etiology - Plan: POCT rapid [facility] discharge follow-up  Acute on chronic combined systolic  and diastolic CHF (congestive heart failure) [facility] Hospital follow-up today- pt recently inpt with CHF exacerbation.  However he has already seen cardiology earlier this week Complaint of sore throat, on exam throat appears normal, strep is negative.  Offered reassurance.  Patient will continue to closely monitor his condition and seek care if he develops shortness of breath or has weight gain  Signed Lamar Blinks, MD

## 2021-07-15 NOTE — Patient Instructions (Signed)
It was great to see you today! No medication changes are needed at this time.   Labs today We will only contact you if something comes back abnormal or we need to make some changes. Otherwise no news is good news!  Keep follow up appointments as scheduled  Do the following things EVERYDAY: Weigh yourself in the morning before breakfast. Write it down and keep it in a log. Take your medicines as prescribed Eat low salt foods--Limit salt (sodium) to 2000 mg per day.  Stay as active as you can everyday Limit all fluids for the day to less than 2 liters  At the [facility], you and your health needs are our priority. As part of our continuing mission to provide you with exceptional heart care, we have created designated Provider Care Teams. These Care Teams include your primary Cardiologist (physician) and Advanced Practice Providers (APPs- Physician Assistants and Nurse Practitioners) who all work together to provide you with the care you need, when you need it.   You may see any of the following providers on your designated Care Team at your next follow up: Dr Glori Bickers Dr Haynes Kerns, NP Lyda Jester, [place] [facility] [place], [place] Audry Riles, PharmD   Please be sure to bring in all your medications bottles to every appointment.    If you have any questions or concerns before your next appointment please send [place] a message through [place] or call our office at [phone].    TO LEAVE A MESSAGE FOR THE NURSE SELECT OPTION 2, PLEASE LEAVE A MESSAGE INCLUDING: YOUR NAME DATE OF BIRTH CALL BACK NUMBER REASON FOR CALL**this is important as we prioritize the call backs  YOU WILL RECEIVE A CALL BACK THE SAME DAY AS LONG AS YOU CALL BEFORE 4:00 PM

## 2021-07-17 ENCOUNTER — Encounter: Payer: Self-pay | Admitting: Family Medicine

## 2021-07-17 ENCOUNTER — Ambulatory Visit (INDEPENDENT_AMBULATORY_CARE_PROVIDER_SITE_OTHER): Payer: Medicare Other

## 2021-07-17 ENCOUNTER — Ambulatory Visit (INDEPENDENT_AMBULATORY_CARE_PROVIDER_SITE_OTHER): Payer: Medicare Other | Admitting: Family Medicine

## 2021-07-17 ENCOUNTER — Other Ambulatory Visit (HOSPITAL_COMMUNITY): Payer: Medicare Other

## 2021-07-17 ENCOUNTER — Other Ambulatory Visit: Payer: Self-pay

## 2021-07-17 VITALS — BP 118/60 | HR 91 | Temp 98.1°F | Resp 12 | Ht 66.0 in | Wt 123.4 lb

## 2021-07-17 DIAGNOSIS — I5043 Acute on chronic combined systolic (congestive) and diastolic (congestive) heart failure: Secondary | ICD-10-CM

## 2021-07-17 DIAGNOSIS — J029 Acute pharyngitis, unspecified: Secondary | ICD-10-CM | POA: Diagnosis not present

## 2021-07-17 DIAGNOSIS — Z09 Encounter for follow-up examination after completed treatment for conditions other than malignant neoplasm: Secondary | ICD-10-CM

## 2021-07-17 DIAGNOSIS — J449 Chronic obstructive pulmonary disease, unspecified: Secondary | ICD-10-CM

## 2021-07-17 LAB — POCT RAPID STREP A (OFFICE): Rapid Strep A Screen: NEGATIVE

## 2021-07-17 NOTE — Chronic Care Management (AMB) (Signed)
Chronic Care Management   CCM RN Visit Note  07/17/2021 Name: Collin Young MRN: [id number] DOB: [birth year]  Subjective: Collin Young is a 70 y.o. year old male who is a primary care patient of Copland, Gay Filler, MD. The care management team was consulted for assistance with disease management and care coordination needs.    Engaged with patient by telephone for initial visit in response to provider referral for case management and/or care coordination services.   Consent to Services:  The patient was given the following information about Chronic Care Management services today, agreed to services, and gave verbal consent: 1. CCM service includes personalized support from designated clinical staff supervised by the primary care provider, including individualized plan of care and coordination with other care providers 2. 24/7 contact phone numbers for assistance for urgent and routine care needs. 3. Service will only be billed when office clinical staff spend 20 minutes or more in a month to coordinate care. 4. Only one practitioner may furnish and bill the service in a calendar month. 5.The patient may stop CCM services at any time (effective at the end of the month) by phone call to the office staff. 6. The patient will be responsible for cost sharing (co-pay) of up to 20% of the service fee (after annual deductible is met). Patient agreed to services and consent obtained.  Patient agreed to services and verbal consent obtained.   Assessment: Review of patient past medical history, allergies, medications, health status, including review of consultants reports, laboratory and other test data, was performed as part of comprehensive evaluation and provision of chronic care management services.   SDOH (Social Determinants of Health) assessments and interventions performed:  SDOH Interventions    Flowsheet Row Most Recent Value  SDOH Interventions   Food Insecurity Interventions  Intervention Not Indicated  Transportation Interventions Intervention Not Indicated        CCM Care Plan  No Known Allergies  Outpatient Encounter Medications as of 07/17/2021  Medication Sig Note   albuterol (VENTOLIN HFA) 108 (90 Base) MCG/ACT inhaler INHALE 2 PUFFS INTO THE LUNGS EVERY 6 HOURS AS NEEDED FOR WHEEZING OR SHORTNESS OF BREATH    apixaban (ELIQUIS) 5 MG TABS tablet Take 1 tablet (5 mg total) by mouth 2 (two) times daily.    BAYER LOW DOSE 81 MG EC tablet Take 81 mg by mouth daily. Swallow whole.    dapagliflozin propanediol (FARXIGA) 10 MG TABS tablet Take 1 tablet (10 mg total) by mouth daily.    digoxin 62.5 MCG TABS Take 0.0625 mg by mouth daily.    Evolocumab (REPATHA SURECLICK) 321 MG/ML SOAJ Inject 140 mg into the skin every 14 (fourteen) days.    fentaNYL (DURAGESIC) 12 MCG/HR Place 1 patch onto the skin every 3 (three) days.    furosemide (LASIX) 40 MG tablet Take 1 tablet (40 mg total) by mouth daily. 07/17/2021: Have 20 mg tablets and is taking two tablets once a day.   hydrALAZINE (APRESOLINE) 25 MG tablet Take 1.5 tablets (37.5 mg total) by mouth every 8 (eight) hours.    ipratropium (ATROVENT) 0.03 % nasal spray Place 2 sprays into both nostrils every 12 (twelve) hours.    isosorbide mononitrate (IMDUR) 30 MG 24 hr tablet Take 1 tablet (30 mg total) by mouth daily.    ivabradine (CORLANOR) 5 MG TABS tablet Take 1/2 tablet (2.5 mg total) by mouth 2 (two) times daily with a meal.    Multiple Vitamin (MULTIVITAMIN) tablet Take 1  tablet by mouth 3 (three) times a week.    polyethylene glycol (MIRALAX / GLYCOLAX) 17 g packet Take 17 g by mouth daily as needed for mild constipation.    pravastatin (PRAVACHOL) 80 MG tablet Take 1 tablet (80 mg total) by mouth every evening.    sacubitril-valsartan (ENTRESTO) 49-51 MG Take 1 tablet by mouth 2 (two) times daily.    spironolactone (ALDACTONE) 25 MG tablet Take 1 tablet (25 mg total) by mouth daily.     Facility-Administered Encounter Medications as of 07/17/2021  Medication   topical emolient (BIAFINE) emulsion    Patient Active Problem List   Diagnosis Date Noted   Acute on chronic systolic CHF (congestive heart failure) ([place]) 07/07/2021   COPD (chronic obstructive pulmonary disease) ([facility])    Hyponatremia    SOB (shortness of breath)    DNR (do not resuscitate)    Weakness generalized    Elevated LFTs    Pulmonary embolism ([place]) 06/11/2021   Acute on chronic heart failure ([place]) 06/02/2021   GERD without esophagitis 05/08/2021   Mixed hyperlipidemia 05/08/2021   Hypertensive urgency 05/08/2021   Elevated troponin level not due myocardial infarction 05/08/2021   Pure hypercholesterolemia 09/23/2020   Claudication in peripheral vascular disease ([place]) 01/25/2020   Late latent syphilis 01/15/2020   Coronary artery disease involving native coronary artery of native heart 07/05/2019   Chronic kidney disease, stage 3a ([place]) 07/05/2019   Combined systolic and diastolic heart failure ([place]) 09/26/2018   Malignant neoplasm of prostate ([place]) 06/17/2018   Medication management    Dysphagia    Palliative care by specialist    Ischemic cardiomyopathy    Congestive dilated cardiomyopathy ([place])    Acute on chronic systolic congestive heart failure ([place]) 01/02/2017   Malignant neoplasm of pyriform sinus ([place]) 09/28/2013   Piriform sinus tumor 09/24/2013   NONSPECIFIC ABN FINDING RAD & OTH EXAM GI TRACT 05/14/2009    Conditions to be addressed/monitored:CHF and COPD  Care Plan : RN Care Manager Plan of Care  Updates made by Collin Rued, RN since 07/17/2021 12:00 AM     Problem: No Plan of Care established for [name], CHF)   Priority: High     Long-Range Goal: Development of Plan of Care for Chronic Disease Managment   Start Date: 07/17/2021  Expected End Date: 10/17/2021  Priority: High  Note:   Current Barriers: RNCM called to complete initial  assessment. Completed with both Collin Young and Collin daughter, Collin Young (DPR), on speaker phone. Collin Young wears hearing aid to both ears, speaks in whisper. Collin Young was admitted 07/06/21-07/04/21 with acute on chronic congestive heart failure, 06/11/21-06/24/21 with respiratory failure/COPD exacerbation/acute on chronic heart failure/acute Pulmonary embolus; admitted 06/02/21-06/03/21 with congestive heart failure; 05/07/21-05/11/21 with congestive heart failure. Patient lives alone and is independent, but daughter reports she assist patient in managing Collin medications and is involved in Collin care. 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 Reports no difficulty with swallowing, chewing or eating or pain. Daughter reports patient appetite is good. Mr. Sur denies any shortness of breath or edema at this time. He weighs and record weights daily. Ms. Burnadette Pop reports Mr. 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 Knowledge Deficits related to plan of care for management of CHF and COPD  Chronic Disease Management support and education needs related to CHF and COPD  RNCM Clinical Goal(s):  Patient  will verbalize basic understanding of CHF and COPD disease process and self health management plan as evidenced by self report. Attending provider visits, taking medications as prescribed. take all medications exactly as prescribed and will call provider for medication related questions as evidenced by self report and chart notations    attend all scheduled medical appointments: as scheduled/recommended as evidenced by self report, chart notation        through collaboration with RN Care manager, provider, and care team.   Interventions: 1:1 collaboration with primary care provider regarding development and update of comprehensive plan of care as evidenced by provider attestation and  co-signature Inter-disciplinary care team collaboration (see longitudinal plan of care) Evaluation of current treatment plan related to  self management and patient's adherence to plan as established by provider  COPD Interventions: Long Term: new goal  Provided education regarding COPD action plan RNCM reviewed medications. Encouraged to take medications as recommended.  Heart Failure Interventions: Long Term: new goal Provided education on low sodium diet; Reviewed Heart Failure Action Plan in depth and provided written copy; Discussed importance of daily weight and advised patient to weigh and record daily; Discussed signs/symptoms of congestive heart failure and when to call the doctor   Reviewed medications with both Mr. Memmer and Collin daughter.  Patient Goals/Self-Care Activities: Patient will self administer medications as prescribed as evidenced by self report/primary caregiver report  use salt in moderation follow rescue plan if symptoms flare-up eat more whole grains, fruits and vegetables, lean meats and healthy fats know when to call the doctor call for weight gain 3 pounds in a day or 5 pounds in one week; more swelling of your feet, ankles, stomach or hands; it is harder for you to breath when lying down and you need to sit up; Chest discomfort, heaviness or pain; you feel more tired or have less energy than normal, new or worsening dizziness, dry hacky cough, you feel uneasy and you know something is not right. dress right for the weather, hot or cold Weight and record weights daily. Review education provided re: heart failure and COPD and plan to discuss at next telephone call     Plan:Telephone follow up appointment with care management team member scheduled for:  next month The patient has been provided with contact information for the care management team and has been advised to call with any health related questions or concerns.   Thea Silversmith, RN, MSN, BSN,  CCM Care Management Coordinator [facility] [phone]

## 2021-07-17 NOTE — Patient Instructions (Addendum)
Visit Information: Thank you for taking time to visit with me today. Please don't hesitate to contact me if I can be of assistance to you before our next scheduled telephone appointment.  Following is a list of the goals we discussed today:  Patient Goals/Self-Care Activities: Patient will self administer medications as prescribed as evidenced by self report/primary caregiver report  use salt in moderation follow rescue plan if symptoms flare-up eat more whole grains, fruits and vegetables, lean meats and healthy fats know when to call the doctor call for weight gain 3 pounds in a day or 5 pounds in one week; more swelling of your feet, ankles, stomach or hands; it is harder for you to breath when lying down and you need to sit up; Chest discomfort, heaviness or pain; you feel more tired or have less energy than normal, new or worsening dizziness, dry hacky cough, you feel uneasy and you know something is not right. dress right for the weather, hot or cold Weight and record weights daily. Review education provided re: heart failure and COPD and plan to discuss at next telephone call   [place] stands for Dietary Approaches to Stop Hypertension. The DASH eating plan is a healthy eating plan that has been shown to: Reduce high blood pressure (hypertension). Reduce your risk for type 2 diabetes, heart disease, and stroke. Help with weight loss. What are tips for following this plan? Reading food labels Check food labels for the amount of salt (sodium) per serving. Choose foods with less than 5 percent of the Daily Value of sodium. Generally, foods with less than 300 milligrams (mg) of sodium per serving fit into this eating plan. To find whole grains, look for the word "whole" as the first word in the ingredient list. Shopping Buy products labeled as "low-sodium" or "no salt added." Buy fresh foods. Avoid canned foods and pre-made or frozen meals. Cooking Avoid adding salt when  cooking. Use salt-free seasonings or herbs instead of table salt or sea salt. Check with your health care provider or pharmacist before using salt substitutes. Do not fry foods. Cook foods using healthy methods such as baking, boiling, grilling, roasting, and broiling instead. Cook with heart-healthy oils, such as olive, canola, avocado, soybean, or sunflower oil. Meal planning  Eat a balanced diet that includes: 4 or more servings of fruits and 4 or more servings of vegetables each day. Try to fill one-half of your plate with fruits and vegetables. 6-8 servings of whole grains each day. Less than 6 oz (170 g) of lean meat, poultry, or fish each day. A 3-oz (85-g) serving of meat is about the same size as a deck of cards. One egg equals 1 oz (28 g). 2-3 servings of low-fat dairy each day. One serving is 1 cup (237 mL). 1 serving of nuts, seeds, or beans 5 times each week. 2-3 servings of heart-healthy fats. Healthy fats called omega-3 fatty acids are found in foods such as walnuts, flaxseeds, fortified milks, and eggs. These fats are also found in cold-water fish, such as sardines, salmon, and mackerel. Limit how much you eat of: Canned or prepackaged foods. Food that is high in trans fat, such as some fried foods. Food that is high in saturated fat, such as fatty meat. Desserts and other sweets, sugary drinks, and other foods with added sugar. Full-fat dairy products. Do not salt foods before eating. Do not eat more than 4 egg yolks a week. Try to eat at least 2  vegetarian meals a week. Eat more home-cooked food and less restaurant, buffet, and fast food. Lifestyle When eating at a restaurant, ask that your food be prepared with less salt or no salt, if possible. If you drink alcohol: Limit how much you use to: 0-1 drink a day for women who are not pregnant. 0-2 drinks a day for men. Be aware of how much alcohol is in your drink. In the [place] one drink equals one 12 oz bottle of beer  (355 mL), one 5 oz glass of wine (148 mL), or one 1 oz glass of hard liquor (44 mL). General information Avoid eating more than 2,300 mg of salt a day. If you have hypertension, you may need to reduce your sodium intake to 1,500 mg a day. Work with your health care provider to maintain a healthy body weight or to lose weight. Ask what an ideal weight is for you. Get at least 30 minutes of exercise that causes your heart to beat faster (aerobic exercise) most days of the week. Activities may include walking, swimming, or biking. Work with your health care provider or dietitian to adjust your eating plan to your individual calorie needs. What foods should I eat? Fruits All fresh, dried, or frozen fruit. Canned fruit in natural juice (without added sugar). Vegetables Fresh or frozen vegetables (raw, steamed, roasted, or grilled). Low-sodium or reduced-sodium tomato and vegetable juice. Low-sodium or reduced-sodium tomato sauce and tomato paste. Low-sodium or reduced-sodium canned vegetables. Grains Whole-grain or whole-wheat bread. Whole-grain or whole-wheat pasta. Brown rice. [place]. Bulgur. Whole-grain and low-sodium cereals. Pita bread. Low-fat, low-sodium crackers. Whole-wheat flour tortillas. Meats and other proteins Skinless chicken or [place]. Ground chicken or [place]. Pork with fat trimmed off. Fish and seafood. Egg whites. Dried beans, peas, or lentils. Unsalted nuts, nut butters, and seeds. Unsalted canned beans. Lean cuts of beef with fat trimmed off. Low-sodium, lean precooked or cured meat, such as sausages or meat loaves. Dairy Low-fat (1%) or fat-free (skim) milk. Reduced-fat, low-fat, or fat-free cheeses. Nonfat, low-sodium ricotta or cottage cheese. Low-fat or nonfat yogurt. Low-fat, low-sodium cheese. Fats and oils Soft margarine without trans fats. Vegetable oil. Reduced-fat, low-fat, or light mayonnaise and salad dressings (reduced-sodium). Canola, safflower, olive,  avocado, soybean, and sunflower oils. Avocado. Seasonings and condiments Herbs. Spices. Seasoning mixes without salt. Other foods Unsalted popcorn and pretzels. Fat-free sweets. The items listed above may not be a complete list of foods and beverages you can eat. Contact a dietitian for more information. What foods should I avoid? Fruits Canned fruit in a light or heavy syrup. Fried fruit. Fruit in cream or butter sauce. Vegetables Creamed or fried vegetables. Vegetables in a cheese sauce. Regular canned vegetables (not low-sodium or reduced-sodium). Regular canned tomato sauce and paste (not low-sodium or reduced-sodium). Regular tomato and vegetable juice (not low-sodium or reduced-sodium). Angie Fava. Olives. Grains Baked goods made with fat, such as croissants, muffins, or some breads. Dry pasta or rice meal packs. Meats and other proteins Fatty cuts of meat. Ribs. Fried meat. Berniece Salines. Bologna, salami, and other precooked or cured meats, such as sausages or meat loaves. Fat from the back of a pig (fatback). Bratwurst. Salted nuts and seeds. Canned beans with added salt. Canned or smoked fish. Whole eggs or egg yolks. Chicken or [place] with skin. Dairy Whole or 2% milk, cream, and half-and-half. Whole or full-fat cream cheese. Whole-fat or sweetened yogurt. Full-fat cheese. Nondairy creamers. Whipped toppings. Processed cheese and cheese spreads. Fats and oils Butter. Stick margarine.  Lard. Shortening. Ghee. Bacon fat. Tropical oils, such as coconut, palm kernel, or palm oil. Seasonings and condiments Onion salt, garlic salt, seasoned salt, table salt, and sea salt. Worcestershire sauce. Tartar sauce. Barbecue sauce. Teriyaki sauce. Soy sauce, including reduced-sodium. Steak sauce. Canned and packaged gravies. Fish sauce. Oyster sauce. Cocktail sauce. Store-bought horseradish. Ketchup. Mustard. Meat flavorings and tenderizers. Bouillon cubes. Hot sauces. Pre-made or packaged marinades. Pre-made or  packaged taco seasonings. Relishes. Regular salad dressings. Other foods Salted popcorn and pretzels. The items listed above may not be a complete list of foods and beverages you should avoid. Contact a dietitian for more information. Where to find more information National Heart, Lung, and Blood Institute: https://wilson-eaton.com/ American Heart Association: www.heart.org Academy of Nutrition and Dietetics: www.eatright.[place]: www.kidney.org Summary The DASH eating plan is a healthy eating plan that has been shown to reduce high blood pressure (hypertension). It may also reduce your risk for type 2 diabetes, heart disease, and stroke. When on the DASH eating plan, aim to eat more fresh fruits and vegetables, whole grains, lean proteins, low-fat dairy, and heart-healthy fats. With the DASH eating plan, you should limit salt (sodium) intake to 2,300 mg a day. If you have hypertension, you may need to reduce your sodium intake to 1,500 mg a day. Work with your health care provider or dietitian to adjust your eating plan to your individual calorie needs. This information is not intended to replace advice given to you by your health care provider. Make sure you discuss any questions you have with your health care provider. Document Revised: 07/21/2019 Document Reviewed: 07/21/2019 Elsevier Patient Education  2022 [place].   PATIENT GOALS/PLAN OF CARE:  Care Plan : RN Care Manager Plan of Care  Updates made by [name], RN since 07/17/2021 12:00 AM     Problem: No Plan of Care established for [name], CHF)   Priority: High     Long-Range Goal: Development of Plan of Care for Chronic Disease Managment   Start Date: 07/17/2021  Expected End Date: 10/17/2021  Priority: High  Note:   Current Barriers: RNCM called to complete initial assessment. Completed with both Mr. Bolander and his daughter, [name] (DPR), on speaker phone. Mr.  Morozov wears hearing aid to both ears, speaks in whisper. Mr. Tangonan was admitted 07/06/21-07/04/21 with acute on chronic congestive heart failure, 06/11/21-06/24/21 with respiratory failure/COPD exacerbation/acute on chronic heart failure/acute Pulmonary embolus; admitted 06/02/21-06/03/21 with congestive heart failure; 05/07/21-05/11/21 with congestive heart failure. Patient lives alone and is independent, but daughter reports she assist patient in managing his medications and is involved in his care. 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 Reports no difficulty with swallowing, chewing or eating or pain. Daughter reports patient appetite is good. Mr. Mendez denies any shortness of breath or edema at this time. He weighs and record weights daily. Ms. Burnadette Pop reports Mr. 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 Knowledge Deficits related to plan of care for management of CHF and COPD  Chronic Disease Management support and education needs related to CHF and COPD  RNCM Clinical Goal(s):  Patient will verbalize basic understanding of CHF and COPD disease process and self health management plan as evidenced by self report. Attending provider visits, taking medications as prescribed. take all medications exactly as prescribed and will call provider for medication related questions as evidenced by self report  and chart notations    attend all scheduled medical appointments: as scheduled/recommended as evidenced by self report, chart notation        through collaboration with RN Care manager, provider, and care team.   Interventions: 1:1 collaboration with primary care provider regarding development and update of comprehensive plan of care as evidenced by provider attestation and co-signature Inter-disciplinary care team collaboration (see longitudinal plan of care) Evaluation of  current treatment plan related to  self management and patient's adherence to plan as established by provider  COPD Interventions: Long Term: new goal  Provided education regarding COPD action plan RNCM reviewed medications. Encouraged to take medications as recommended.  Heart Failure Interventions: Long Term: new goal Provided education on low sodium diet; Reviewed Heart Failure Action Plan in depth and provided written copy; Discussed importance of daily weight and advised patient to weigh and record daily; Discussed signs/symptoms of congestive heart failure and when to call the doctor   Reviewed medications with both Mr. Claunch and his daughter.  Patient Goals/Self-Care Activities: Patient will self administer medications as prescribed as evidenced by self report/primary caregiver report  use salt in moderation follow rescue plan if symptoms flare-up eat more whole grains, fruits and vegetables, lean meats and healthy fats know when to call the doctor call for weight gain 3 pounds in a day or 5 pounds in one week; more swelling of your feet, ankles, stomach or hands; it is harder for you to breath when lying down and you need to sit up; Chest discomfort, heaviness or pain; you feel more tired or have less energy than normal, new or worsening dizziness, dry hacky cough, you feel uneasy and you know something is not right. dress right for the weather, hot or cold Weight and record weights daily. Review education provided re: heart failure and COPD and plan to discuss at next telephone call       Consent to CCM Services: Mr. Rivero was given information about Chronic Care Management services including:  CCM service includes personalized support from designated clinical staff supervised by his physician, including individualized plan of care and coordination with other care providers 24/7 contact phone numbers for assistance for urgent and routine care needs. Service will only be  billed when office clinical staff spend 20 minutes or more in a month to coordinate care. Only one practitioner may furnish and bill the service in a calendar month. The patient may stop CCM services at any time (effective at the end of the month) by phone call to the office staff. The patient will be responsible for cost sharing (co-pay) of up to 20% of the service fee (after annual deductible is met).  Patient agreed to services and verbal consent obtained.   Patient verbalizes understanding of instructions provided today and agrees to view in [place].   Telephone follow up appointment with care management team member scheduled for: 08/14/21. If you need to cancel or re-schedule our visit, please call [phone] and our care guide team will be happy to assist you.  Thea Silversmith, RN, MSN, BSN, CCM Care Management Coordinator [facility] [phone]

## 2021-07-17 NOTE — Patient Instructions (Signed)
Good to see you again today- I hope that you continue to do ok!  Let me know if I can help you

## 2021-07-22 ENCOUNTER — Other Ambulatory Visit (HOSPITAL_BASED_OUTPATIENT_CLINIC_OR_DEPARTMENT_OTHER): Payer: Self-pay

## 2021-07-22 MED ORDER — MODERNA COVID-19 BIVAL BOOSTER 50 MCG/0.5ML IM SUSP
INTRAMUSCULAR | 0 refills | Status: DC
  Filled 2021-07-22: qty 0.5, 1d supply, fill #0

## 2021-07-22 NOTE — Progress Notes (Incomplete)
***In Progress*** PCP: Copland, Gay Filler, MD HF Cardiologist: Dr. Haroldine Laws  HPI:  Collin Young is a 70 y.o.male w/ chronic combined systolic and diastolic CHF/ ischemic CM, severe multivessel CAD treated medically, 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     HF dates back to 12/2016. Echo then w/ severely reduced LVEF 10-15%, RV mild-mod reduced, mod AI/MR/TR. LHC showed severe calcific disease in the proximal to mid RCA and chronic total occlusion of the proximal circumflex.There was consideration of RCA PCI.  However, this would have required hemodynamic support and was deferred given his frailty and comorbidities. CAD managed medically.    Repeat Echo 05/2017, EF improved up to 45-50%, Mod AI. RV normal.     Echo 05/2020 EF down again, 35-40%. RV normal    Admitted to [facility] 05/2021 w/ a/c CHF. Echo EF down 25-30%, RV not well visualized. Mod MR/AI. Chest CT showed  large right pleural effusion.  There was questionable PE on his CT but ultimately this was thought to be just artifact with poor opacification. Given IV Lasix and discharged home on Entresto, Farxiga, metoprolol XL and PRN Lasix.    Presented back to [facility] ED on 06/11/21 via EMS w/ acute SOB and hypoxic respiratory failure. Repeat chest CT showed rt lower lobe PE + large rt pleural effusion and small left pleural effusion. Underwent Rt Thoracentesis, fluid analysis c/w transudative effusion. Heparin started for PE, eventually switched to Eliquis. Cardiology consulted for a/c CHF. Started on IV Lasix for diuresis. Limited Echo repeated, EF 20%, global HK, RV normal. Developed signs of low output and transferred to [facility] for RHC, which showed cardiogenic shock with R>L heart failure. Milrinone started. GDMT titrated and able to wean milrinone off. Not felt to be a VAD candidate due to cachexia and RV failure. Discharged home, weight 121 lbs.   Had post hospital  f/u 07/04/21. Doing ok w/ NYHA Class II symptoms. Weight was up 4 lbs post hospital and ReDs clip was elevated at 36%. Entresto was increased to 49-51 mg BID and Lasix was added 2 days a week, qMon + qFri. Digoxin was also reduced to 0.0625 due to elevated level (1.0).    Admitted 07/07/21 with acute on chronic CHF. Admitted and started on IV Lasix. Repeat echo showed EF 25-30%, severe LV dysfunction, grade I DD, RV mildly reduced.  Transitioned to po Lasix 40 mg daily. Continued on GDMT, discharge weight 122 lbs. Will need formal assessment to determine VAD candidacy but suspect limited due to RV failure and cachexia.   Recently returned to HF Clinic for post hospitalization HF follow up 07/15/21. Overall was feeling fine. He was able to walk and go upstairs without SOB. Denied CP, dizziness, abnormal bleeding, palpitations, edema, or PND/Orthopnea. Appetite was ok. No fever or chills. Weight at home was 117 pounds. Reported taking all medications. Had been staying away from salty foods.  Today he returns to HF clinic for pharmacist medication titration. At last visit with APP, no medication changes were made.   Today he returns to HF clinic for pharmacist medication titration. At last visit with MD ***.   Overall feeling ***. Dizziness, lightheadedness, fatigue:  Chest pain or palpitations:  How is your breathing?: *** SOB: Able to complete all ADLs. Activity level ***  Weight at home pounds. Takes furosemide/torsemide/bumex *** mg *** daily.  LEE PND/Orthopnea  Appetite *** Low-salt diet:   Physical Exam Cost/affordability of  meds  -dig lvl -inc ivabradine +/- hydral or entresto -APP 12/16  Shortness of breath/dyspnea on exertion? {YES NO:22349}  Orthopnea/PND? {YES NO:22349} Edema? {YES NO:22349} Lightheadedness/dizziness? {YES NO:22349} Daily weights at home? {YES NO:22349} Blood pressure/heart rate monitoring at home? {YES [id number] Following low-sodium/fluid-restricted diet?  {YES NO:22349}  HF Medications: Entresto 49/51 mg BID Spironolactone 25 mg daily Farxiga 10 mg daily Hydralazine 37.5 mg TID Imdur 30 mg daily Digoxin 0.0625 mg daily Ivabradine 2.5 mg BID Furosemide 40 mg daily  Has the patient been experiencing any side effects to the medications prescribed?  {YES NO:22349}  Does the patient have any problems obtaining medications due to transportation or finances?   {YES NO:22349}  Understanding of regimen: {excellent/good/fair/poor:19665} Understanding of indications: {excellent/good/fair/poor:19665} Potential of compliance: {excellent/good/fair/poor:19665} Patient understands to avoid NSAIDs. Patient understands to avoid decongestants.    Pertinent Lab Values: Serum creatinine ***, BUN ***, Potassium ***, Sodium ***, BNP ***, Magnesium ***, Digoxin ***   Vital Signs: Weight: *** (last clinic weight: ***) Blood pressure: ***  Heart rate: ***   Assessment/Plan: 1.  Chronic Systolic Heart Failure  - Echo 2018 EF 10-15%, RV moderately reduced. LHC w/ MVCAD management medically - Echo 05/2020 EF down again, 35-40%. RV normal  - Echo 05/2021 EF down 25-30%, RV not well visualized. Mod MR and Mod AI. - Limited Echo 05/2021: EF 20%, RV normal  - Recent admit 10/22 w/ a/c CHF w/ cardiogenic shock w/ AKI + Shock liver - RHC 06/16/21 w/ R>L HF and low output, CI 1.8. PAPi 1.3. Required milrinone but improved and able to wean off - Echo 07/2021: EF 25-30%, global HK, RV mildly reduced. - Better NYHA II symptoms, euvolemic on exam - Continue Lasix 40 mg daily.  - Continue Entresto 49/51 mg BID. - Continue spironolactone 25 mg daily.  - Continue Farxiga 10 mg daily. - Continue hydralazine 37.5 mg TID + Imdur 30 mg daily.  - Continue ivabradine 2.5 mg BID.  - Continue Digoxin 0.0625 mg daily (on reduced dose due to elevated level)  - Not felt to be VAD candidate due to cachexia and RV failure. Can reconsider as he improves. May need another RHC at  some point.   2. Pulmonary Embolism  - Diagnosed 05/2021 - Limited echo w/ no RV strain  - Suspect incidental due HF - On Eliquis 5 mg BID, reports full compliance    3. CKD Stage IIIa - On SGLT2i (Farxiga)   4. H/o Pleural Effusion, right - s/p thoracentesis by CCM 06/11/21 - 1200 cc removed, transudative  - CXR this admit w/ bilateral pleural effusions, at least moderate on the right - Dyspnea improved with diuresis   5. H/o esophageal CA and throat CA - s/p chemo and XRT.   6. H/o Hyponatremia - Likely hypervolemic - Fluid restrict  Follow up ***   Audry Riles, PharmD, BCPS, BCCP, CPP Heart Failure Clinic Pharmacist [phone]

## 2021-07-25 ENCOUNTER — Encounter (HOSPITAL_COMMUNITY): Payer: Self-pay | Admitting: Internal Medicine

## 2021-07-25 ENCOUNTER — Ambulatory Visit: Payer: Medicare Other | Admitting: Hematology & Oncology

## 2021-07-25 ENCOUNTER — Other Ambulatory Visit: Payer: Medicare Other

## 2021-07-28 ENCOUNTER — Other Ambulatory Visit (HOSPITAL_COMMUNITY): Payer: Self-pay | Admitting: *Deleted

## 2021-07-28 MED ORDER — APIXABAN 5 MG PO TABS: 5.0000 mg | ORAL_TABLET | Freq: Two times a day (BID) | ORAL | 6 refills | Status: DC

## 2021-07-28 MED ORDER — SPIRONOLACTONE 25 MG PO TABS: 25.0000 mg | ORAL_TABLET | Freq: Every day | ORAL | 6 refills | Status: DC

## 2021-07-28 MED ORDER — DIGOXIN 62.5 MCG PO TABS: 0.0625 mg | ORAL_TABLET | Freq: Every day | ORAL | 6 refills | Status: DC

## 2021-07-29 ENCOUNTER — Other Ambulatory Visit: Payer: Self-pay | Admitting: Family Medicine

## 2021-07-29 ENCOUNTER — Other Ambulatory Visit (HOSPITAL_COMMUNITY): Payer: Self-pay | Admitting: *Deleted

## 2021-07-29 ENCOUNTER — Other Ambulatory Visit (HOSPITAL_COMMUNITY): Payer: Self-pay

## 2021-07-29 MED ORDER — ISOSORBIDE MONONITRATE ER 30 MG PO TB24: 30.0000 mg | ORAL_TABLET | Freq: Every day | ORAL | 3 refills | Status: DC

## 2021-07-30 DIAGNOSIS — I5043 Acute on chronic combined systolic (congestive) and diastolic (congestive) heart failure: Secondary | ICD-10-CM

## 2021-07-30 DIAGNOSIS — J449 Chronic obstructive pulmonary disease, unspecified: Secondary | ICD-10-CM | POA: Diagnosis not present

## 2021-07-31 ENCOUNTER — Ambulatory Visit (HOSPITAL_COMMUNITY): Admission: RE | Admit: 2021-07-31 | Payer: Medicare Other | Source: Ambulatory Visit

## 2021-08-01 ENCOUNTER — Inpatient Hospital Stay: Payer: Medicare Other | Attending: Hematology & Oncology

## 2021-08-01 ENCOUNTER — Other Ambulatory Visit: Payer: Self-pay | Admitting: *Deleted

## 2021-08-01 ENCOUNTER — Other Ambulatory Visit: Payer: Self-pay

## 2021-08-01 ENCOUNTER — Inpatient Hospital Stay (HOSPITAL_BASED_OUTPATIENT_CLINIC_OR_DEPARTMENT_OTHER): Payer: Medicare Other | Admitting: Hematology & Oncology

## 2021-08-01 ENCOUNTER — Encounter: Payer: Self-pay | Admitting: Hematology & Oncology

## 2021-08-01 VITALS — BP 71/50 | HR 93 | Temp 97.9°F | Resp 19 | Wt 128.0 lb

## 2021-08-01 DIAGNOSIS — I2699 Other pulmonary embolism without acute cor pulmonale: Secondary | ICD-10-CM | POA: Insufficient documentation

## 2021-08-01 DIAGNOSIS — Z8522 Personal history of malignant neoplasm of nasal cavities, middle ear, and accessory sinuses: Secondary | ICD-10-CM | POA: Insufficient documentation

## 2021-08-01 DIAGNOSIS — C12 Malignant neoplasm of pyriform sinus: Secondary | ICD-10-CM

## 2021-08-01 DIAGNOSIS — Z9221 Personal history of antineoplastic chemotherapy: Secondary | ICD-10-CM | POA: Diagnosis not present

## 2021-08-01 DIAGNOSIS — C61 Malignant neoplasm of prostate: Secondary | ICD-10-CM

## 2021-08-01 DIAGNOSIS — Z79899 Other long term (current) drug therapy: Secondary | ICD-10-CM | POA: Diagnosis not present

## 2021-08-01 DIAGNOSIS — I509 Heart failure, unspecified: Secondary | ICD-10-CM | POA: Insufficient documentation

## 2021-08-01 DIAGNOSIS — Z923 Personal history of irradiation: Secondary | ICD-10-CM | POA: Insufficient documentation

## 2021-08-01 LAB — CBC WITH DIFFERENTIAL (CANCER CENTER ONLY)
Abs Immature Granulocytes: 0.01 10*3/uL (ref 0.00–0.07)
Basophils Absolute: 0 10*3/uL (ref 0.0–0.1)
Basophils Relative: 1 %
Eosinophils Absolute: 0.5 10*3/uL (ref 0.0–0.5)
Eosinophils Relative: 12 %
HCT: 41.8 % (ref 39.0–52.0)
Hemoglobin: 13 g/dL (ref 13.0–17.0)
Immature Granulocytes: 0 %
Lymphocytes Relative: 36 %
Lymphs Abs: 1.6 10*3/uL (ref 0.7–4.0)
MCH: 25.9 pg — ABNORMAL LOW (ref 26.0–34.0)
MCHC: 31.1 g/dL (ref 30.0–36.0)
MCV: 83.4 fL (ref 80.0–100.0)
Monocytes Absolute: 0.8 10*3/uL (ref 0.1–1.0)
Monocytes Relative: 17 %
Neutro Abs: 1.5 10*3/uL — ABNORMAL LOW (ref 1.7–7.7)
Neutrophils Relative %: 34 %
Platelet Count: 188 10*3/uL (ref 150–400)
RBC: 5.01 MIL/uL (ref 4.22–5.81)
RDW: 16.7 % — ABNORMAL HIGH (ref 11.5–15.5)
WBC Count: 4.4 10*3/uL (ref 4.0–10.5)
nRBC: 0 % (ref 0.0–0.2)

## 2021-08-01 LAB — CMP (CANCER CENTER ONLY)
ALT: 12 U/L (ref 0–44)
AST: 20 U/L (ref 15–41)
Albumin: 3.9 g/dL (ref 3.5–5.0)
Alkaline Phosphatase: 51 U/L (ref 38–126)
Anion gap: 9 (ref 5–15)
BUN: 33 mg/dL — ABNORMAL HIGH (ref 8–23)
CO2: 31 mmol/L (ref 22–32)
Calcium: 10.2 mg/dL (ref 8.9–10.3)
Chloride: 96 mmol/L — ABNORMAL LOW (ref 98–111)
Creatinine: 1.62 mg/dL — ABNORMAL HIGH (ref 0.61–1.24)
GFR, Estimated: 45 mL/min — ABNORMAL LOW (ref >60)
Glucose, Bld: 111 mg/dL — ABNORMAL HIGH (ref 70–99)
Potassium: 4.8 mmol/L (ref 3.5–5.1)
Sodium: 136 mmol/L (ref 135–145)
Total Bilirubin: 0.4 mg/dL (ref 0.3–1.2)
Total Protein: 7.3 g/dL (ref 6.5–8.1)

## 2021-08-01 LAB — LACTATE DEHYDROGENASE: LDH: 182 U/L (ref 98–192)

## 2021-08-01 LAB — TSH: TSH: 3.586 u[IU]/mL (ref 0.320–4.118)

## 2021-08-01 MED ORDER — FENTANYL 12 MCG/HR TD PT72: 1.0000 | MEDICATED_PATCH | TRANSDERMAL | 0 refills | Status: DC

## 2021-08-01 NOTE — Telephone Encounter (Signed)
Opened in error

## 2021-08-01 NOTE — Progress Notes (Signed)
Hematology and Oncology Follow Up Visit  Collin Young [id number] 1950/10/29 70 y.o. 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  Collin Young is back for followup.  Since we last had seen him, he has been in the hospital.  He has been hospitalized a couple times.  He had a pulmonary embolism back in September.  He had a large right pleural effusion that was drained.  This was not malignant.    He has congestive heart failure.  He had an echocardiogram done a month ago.  This showed an ejection fraction of 20-25%.  He does see cardiology next week.  Blood pressure is on the low side.  I am sure this is from his cardiac meds.  He did have a nice Thanksgiving.  He was with his family.  He is able to eat okay.  He is able to swallow okay.  His pain control is doing well on low-dose fentanyl patches.  He has had no problems with bowels or bladder.  He has had some issues with his "manly functioning."    Currently, I would have to say his performance status is probably ECOG 2.      Medications:  Current Outpatient Medications:    albuterol (VENTOLIN HFA) 108 (90 Base) MCG/ACT inhaler, INHALE 2 PUFFS INTO THE LUNGS EVERY 6 HOURS AS NEEDED FOR WHEEZING OR SHORTNESS OF BREATH, Disp: 20.1 g, Rfl: 1   apixaban (ELIQUIS) 5 MG TABS tablet, Take 1 tablet (5 mg total) by mouth 2 (two) times daily., Disp: 60 tablet, Rfl: 6   BAYER LOW DOSE 81 MG EC tablet, Take 81 mg by mouth daily. Swallow whole., Disp: , Rfl:    COVID-19 mRNA bivalent vaccine, Moderna, (MODERNA COVID-19 BIVAL BOOSTER) 50 MCG/0.5ML injection, Inject into the muscle., Disp: 0.5  mL, Rfl: 0   dapagliflozin propanediol (FARXIGA) 10 MG TABS tablet, Take 1 tablet (10 mg total) by mouth daily., Disp: 30 tablet, Rfl: 2   Digoxin 62.5 MCG TABS, Take 0.0625 mg by mouth daily., Disp: 30 tablet, Rfl: 6   Evolocumab (REPATHA SURECLICK) 258 MG/ML SOAJ, Inject 140 mg into the skin every 14 (fourteen) days., Disp: 2 mL, Rfl: 11   fentaNYL (DURAGESIC) 12 MCG/HR, Place 1 patch onto the skin every 3 (three) days., Disp: 10 patch, Rfl: 0   furosemide (LASIX) 40 MG tablet, Take 1 tablet (40 mg total) by mouth daily., Disp: 30 tablet, Rfl: 0   hydrALAZINE (APRESOLINE) 25 MG tablet, Take 1.5 tablets (37.5 mg total) by mouth every 8 (eight) hours., Disp: 135 tablet, Rfl: 0   ipratropium (ATROVENT) 0.03 % nasal spray, Place 2 sprays into both nostrils every 12 (twelve) hours., Disp: 30 mL, Rfl: 12   isosorbide mononitrate (IMDUR) 30 MG 24 hr tablet, Take 1 tablet (30 mg total) by mouth daily., Disp: 30 tablet, Rfl: 3   Multiple Vitamin (MULTIVITAMIN) tablet, Take 1 tablet by mouth 3 (three) times a week., Disp: , Rfl:    polyethylene glycol (MIRALAX / GLYCOLAX) 17 g packet, Take 17 g by mouth daily as needed for mild constipation., Disp: 14 each, Rfl: 0   pravastatin (PRAVACHOL) 80 MG  tablet, Take 1 tablet (80 mg total) by mouth every evening., Disp: 90 tablet, Rfl: 3   sacubitril-valsartan (ENTRESTO) 49-51 MG, Take 1 tablet by mouth 2 (two) times daily., Disp: 60 tablet, Rfl: 11   spironolactone (ALDACTONE) 25 MG tablet, Take 1 tablet (25 mg total) by mouth daily., Disp: 30 tablet, Rfl: 6 No current facility-administered medications for this visit.  Facility-Administered Medications Ordered in Other Visits:    topical emolient (BIAFINE) emulsion, , Topical, Daily, Kyung Rudd, MD, Given at 01/19/14 0912  Allergies: No Known Allergies  Past Medical History, Surgical history, Social history, and Family History were reviewed and updated.  Review of Systems: Review of Systems   Constitutional: Negative.   HENT: Negative.    Eyes: Negative.   Respiratory: Negative.    Cardiovascular: Negative.   Gastrointestinal: Negative.   Genitourinary: Negative.   Musculoskeletal: Negative.   Skin: Negative.   Neurological: Negative.   Endo/Heme/Allergies: Negative.   Psychiatric/Behavioral: Negative.      Physical Exam:  weight is 128 lb (58.1 kg). His oral temperature is 97.9 F (36.6 C). His blood pressure is 71/50 (abnormal) and his pulse is 93. His respiration is 19 and oxygen saturation is 100%.   Physical Exam Vitals reviewed.  HENT:     Head: Normocephalic and atraumatic.  Eyes:     Pupils: Pupils are equal, round, and reactive to light.  Cardiovascular:     Rate and Rhythm: Normal rate and regular rhythm.     Heart sounds: Normal heart sounds.  Pulmonary:     Effort: Pulmonary effort is normal.     Breath sounds: Normal breath sounds.  Abdominal:     General: Bowel sounds are normal.     Palpations: Abdomen is soft.     Comments: Abdominal exam shows a soft abdomen.  Bowel sounds are present.  He has the healed G-tube opening.  There is no exudate.  He has no fluid wave.  There is no palpable liver or spleen tip.  Musculoskeletal:        General: No tenderness or deformity. Normal range of motion.     Cervical back: Normal range of motion.  Lymphadenopathy:     Cervical: No cervical adenopathy.  Skin:    General: Skin is warm and dry.     Findings: No erythema or rash.  Neurological:     Mental Status: He is alert and oriented to person, place, and time.  Psychiatric:        Behavior: Behavior normal.        Thought Content: Thought content normal.        Judgment: Judgment normal.    Lab Results  Component Value Date   WBC 4.4 08/01/2021   HGB 13.0 08/01/2021   HCT 41.8 08/01/2021   MCV 83.4 08/01/2021   PLT 188 08/01/2021     Chemistry      Component Value Date/Time   NA 136 08/01/2021 0839   NA 135 05/29/2020 1556   NA 139  07/30/2017 0926   NA 137 03/10/2017 1000   K 4.8 08/01/2021 0839   K 3.9 07/30/2017 0926   K 4.3 03/10/2017 1000   CL 96 (L) 08/01/2021 0839   CL 96 (L) 07/30/2017 0926   CO2 31 08/01/2021 0839   CO2 33 07/30/2017 0926   CO2 29 03/10/2017 1000   BUN 33 (H) 08/01/2021 0839   BUN 23 05/29/2020 1556   BUN 17 07/30/2017 0926   BUN 14.7 03/10/2017 1000  CREATININE 1.62 (H) 08/01/2021 0839   CREATININE 1.4 (H) 07/30/2017 0926   CREATININE 1.1 03/10/2017 1000      Component Value Date/Time   CALCIUM 10.2 08/01/2021 0839   CALCIUM 9.6 07/30/2017 0926   CALCIUM 10.0 03/10/2017 1000   ALKPHOS 51 08/01/2021 0839   ALKPHOS 49 07/30/2017 0926   ALKPHOS 51 03/10/2017 1000   AST 20 08/01/2021 0839   AST 23 03/10/2017 1000   ALT 12 08/01/2021 0839   ALT 31 07/30/2017 0926   ALT 18 03/10/2017 1000   BILITOT 0.4 08/01/2021 0839   BILITOT 0.41 03/10/2017 1000       Impression and Plan: Mr. Gallina is 70 year old gentleman. He had a localized recurrence of carcinoma of the left pyriform sinus. He underwent concurrent chemotherapy and radiation therapy. He finished his treatment back in April of 2015.   I just hate that he has had the problem pulmonary embolism.  I think he is going to need to be on lifelong anticoagulation given his cardiac function.  I am sure cardiology will do a fantastic job with his heart function to try to keep him active.  We will still plan for follow-up in 6 months     12/2/20229:27 AM

## 2021-08-06 ENCOUNTER — Other Ambulatory Visit (HOSPITAL_COMMUNITY): Payer: Self-pay | Admitting: *Deleted

## 2021-08-06 ENCOUNTER — Telehealth (HOSPITAL_COMMUNITY): Payer: Self-pay | Admitting: Emergency Medicine

## 2021-08-06 DIAGNOSIS — Z01810 Encounter for preprocedural cardiovascular examination: Secondary | ICD-10-CM

## 2021-08-06 NOTE — Telephone Encounter (Signed)
Reaching out to patient to offer assistance regarding upcoming cardiac imaging study; pt verbalizes understanding of appt date/time, parking situation and where to check in, pre-test NPO status and medications ordered, and verified current allergies; name and call back number provided for further questions should they arise Marchia Bond RN [facility] [phone] office [phone] cell  Spoke with daughter because patient is hard of hearing. Denies claustro Only implants include hearing aids and dentures Arrival time 1130a

## 2021-08-07 ENCOUNTER — Other Ambulatory Visit (HOSPITAL_COMMUNITY): Payer: Medicare Other

## 2021-08-07 ENCOUNTER — Other Ambulatory Visit: Payer: Self-pay

## 2021-08-07 ENCOUNTER — Ambulatory Visit (HOSPITAL_COMMUNITY)
Admission: RE | Admit: 2021-08-07 | Discharge: 2021-08-07 | Disposition: A | Discharge status: Home / Self Care | Payer: Medicare Other | Source: Ambulatory Visit | Attending: Cardiovascular Disease | Admitting: Cardiovascular Disease

## 2021-08-07 DIAGNOSIS — I259 Chronic ischemic heart disease, unspecified: Secondary | ICD-10-CM | POA: Insufficient documentation

## 2021-08-07 DIAGNOSIS — I255 Ischemic cardiomyopathy: Secondary | ICD-10-CM

## 2021-08-07 MED ORDER — GADOBUTROL 1 MMOL/ML IV SOLN
6.0000 mL | Freq: Once | INTRAVENOUS | Status: AC | PRN
  Administered 2021-08-07: 6 mL via INTRAVENOUS

## 2021-08-14 ENCOUNTER — Ambulatory Visit (INDEPENDENT_AMBULATORY_CARE_PROVIDER_SITE_OTHER): Payer: Medicare Other

## 2021-08-14 ENCOUNTER — Telehealth (HOSPITAL_COMMUNITY): Payer: Self-pay

## 2021-08-14 DIAGNOSIS — J449 Chronic obstructive pulmonary disease, unspecified: Secondary | ICD-10-CM

## 2021-08-14 DIAGNOSIS — I5023 Acute on chronic systolic (congestive) heart failure: Secondary | ICD-10-CM

## 2021-08-14 NOTE — Telephone Encounter (Signed)
Called to confirm/remind patient of their appointment at the [facility] on 08/15/21.   Patient reminded to bring all medications and/or complete list.  Confirmed patient has transportation. Gave directions, instructed to utilize [place] parking.  Confirmed appointment prior to ending call.

## 2021-08-14 NOTE — Patient Instructions (Signed)
Visit Information  Thank you for taking time to visit with me today. Please don't hesitate to contact me if I can be of assistance to you before our next scheduled telephone appointment.  Following are the goals we discussed today:  Patient Goals/Self-Care Activities: Take medications as prescribed   follow rescue plan if symptoms flare-up eat more whole grains, fruits and vegetables, lean meats and healthy fats know when to call the doctor call for weight gain 3 pounds in a day or 5 pounds in one week; more swelling of your feet, ankles, stomach or hands; it is harder for you to breath when lying down and you need to sit up; Chest discomfort, heaviness or pain; you feel more tired or have less energy than normal, new or worsening dizziness, dry hacky cough, you feel uneasy and you know something is not right. dress right for the weather, hot or cold Weight and record weights daily. Attend follow up appointments as scheduled  Our next appointment is by telephone on 09/18/21 at 11:00 am  Please call the care guide team at [phone] if you need to cancel or reschedule your appointment.   If you are experiencing a Mental Health or [place] or need someone to talk to, please call the Suicide and Crisis Lifeline: 988 call 1-800-273-TALK (toll free, 24 hour hotline)   Patient verbalizes understanding of instructions provided today and agrees to view in [place].   Thea Silversmith, RN, MSN, BSN, CCM Care Management Coordinator [facility] [phone]

## 2021-08-14 NOTE — Chronic Care Management (AMB) (Signed)
Chronic Care Management   CCM RN Visit Note  08/14/2021 Name: Collin Young MRN: [id number] DOB: August 03, 1951  Subjective: Collin Young is a 70 y.o. year old male who is a primary care patient of Collin Young, Collin Filler, MD. The care management team was consulted for assistance with disease management and care coordination needs.    Engaged with patient by telephone for follow up visit in response to provider referral for case management and/or care coordination services.   Consent to Services:  The patient was given information about Chronic Care Management services, agreed to services, and gave verbal consent prior to initiation of services.  Please see initial visit note for detailed documentation.   Patient agreed to services and verbal consent obtained.   Assessment: Review of patient past medical history, allergies, medications, health status, including review of consultants reports, laboratory and other test data, was performed as part of comprehensive evaluation and provision of chronic care management services.   SDOH (Social Determinants of Health) assessments and interventions performed:    CCM Care Plan  No Known Allergies  Outpatient Encounter Medications as of 08/14/2021  Medication Sig Note   albuterol (VENTOLIN HFA) 108 (90 Base) MCG/ACT inhaler INHALE 2 PUFFS INTO THE LUNGS EVERY 6 HOURS AS NEEDED FOR WHEEZING OR SHORTNESS OF BREATH    apixaban (ELIQUIS) 5 MG TABS tablet Take 1 tablet (5 mg total) by mouth 2 (two) times daily.    BAYER LOW DOSE 81 MG EC tablet Take 81 mg by mouth daily. Swallow whole.    COVID-19 mRNA bivalent vaccine, Moderna, (MODERNA COVID-19 BIVAL BOOSTER) 50 MCG/0.5ML injection Inject into the muscle.    dapagliflozin propanediol (FARXIGA) 10 MG TABS tablet Take 1 tablet (10 mg total) by mouth daily.    Digoxin 62.5 MCG TABS Take 0.0625 mg by mouth daily.    Evolocumab (REPATHA SURECLICK) 465 MG/ML SOAJ Inject 140 mg into the skin every 14  (fourteen) days.    fentaNYL (DURAGESIC) 12 MCG/HR Place 1 patch onto the skin every 3 (three) days.    furosemide (LASIX) 40 MG tablet Take 1 tablet (40 mg total) by mouth daily. 07/17/2021: Have 20 mg tablets and is taking two tablets once a day.   hydrALAZINE (APRESOLINE) 25 MG tablet Take 1.5 tablets (37.5 mg total) by mouth every 8 (eight) hours.    ipratropium (ATROVENT) 0.03 % nasal spray Place 2 sprays into both nostrils every 12 (twelve) hours.    isosorbide mononitrate (IMDUR) 30 MG 24 hr tablet Take 1 tablet (30 mg total) by mouth daily.    Multiple Vitamin (MULTIVITAMIN) tablet Take 1 tablet by mouth 3 (three) times a week.    polyethylene glycol (MIRALAX / GLYCOLAX) 17 g packet Take 17 g by mouth daily as needed for mild constipation.    pravastatin (PRAVACHOL) 80 MG tablet Take 1 tablet (80 mg total) by mouth every evening.    sacubitril-valsartan (ENTRESTO) 49-51 MG Take 1 tablet by mouth 2 (two) times daily.    spironolactone (ALDACTONE) 25 MG tablet Take 1 tablet (25 mg total) by mouth daily.    Facility-Administered Encounter Medications as of 08/14/2021  Medication   topical emolient (BIAFINE) emulsion    Patient Active Problem List   Diagnosis Date Noted   Acute on chronic systolic CHF (congestive heart failure) ([place]) 07/07/2021   COPD (chronic obstructive pulmonary disease) ([facility])    Hyponatremia    SOB (shortness of breath)    DNR (do not resuscitate)    Weakness  generalized    Elevated LFTs    Pulmonary embolism ([place]) 06/11/2021   Acute on chronic heart failure ([place]) 06/02/2021   GERD without esophagitis 05/08/2021   Mixed hyperlipidemia 05/08/2021   Hypertensive urgency 05/08/2021   Elevated troponin level not due myocardial infarction 05/08/2021   Pure hypercholesterolemia 09/23/2020   Claudication in peripheral vascular disease ([place]) 01/25/2020   Late latent syphilis 01/15/2020   Coronary artery disease involving native coronary artery of native heart  07/05/2019   Chronic kidney disease, stage 3a ([place]) 07/05/2019   Combined systolic and diastolic heart failure ([place]) 09/26/2018   Malignant neoplasm of prostate ([place]) 06/17/2018   Medication management    Dysphagia    Palliative care by specialist    Ischemic cardiomyopathy    Congestive dilated cardiomyopathy ([place])    Acute on chronic systolic congestive heart failure ([place]) 01/02/2017   Malignant neoplasm of pyriform sinus ([place]) 09/28/2013   Piriform sinus tumor 09/24/2013   NONSPECIFIC ABN FINDING RAD & OTH EXAM GI TRACT 05/14/2009    Conditions to be addressed/monitored:CHF and COPD  Care Plan : RN Care Manager Plan of Care  Updates made by Collin Rued, RN since 08/14/2021 12:00 AM     Problem: No Plan of Care established for [name], CHF)   Priority: High     Long-Range Goal: Development of Plan of Care for Chronic Disease Managment   Start Date: 07/17/2021  Expected End Date: 10/17/2021  Priority: High  Note:   Current Barriers: [name] wears hearing aid to both ears, speaks in whisper. admitted 07/06/21-07/04/21 with acute on chronic congestive heart failure, 06/11/21-06/24/21 with respiratory failure/COPD exacerbation/acute on chronic heart failure/acute Pulmonary embolus; admitted 06/02/21-06/03/21 with congestive heart failure; 05/07/21-05/11/21 with congestive heart failure. Patient lives alone and is independent. Daughter assist with medication management.  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 RNCM follow up call completed with [name]. Daughter Collin Young) conference [name] into the call and RNCM spoke directly with [name]. 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 Continues to weigh and record weight daily. Denies any signs/symptoms of HF exacerbation or COPD exacerbation.  Without questions or concerns. Daughter continues to manage patient medications.  Knowledge Deficits related to plan of care for management of CHF and COPD  Chronic Disease Management support and education needs related to CHF and COPD  RNCM Clinical Goal(s):  Patient will verbalize basic understanding of CHF and COPD disease process and self health management plan as evidenced by self report. Attending provider visits, taking medications as prescribed. take all medications exactly as prescribed and will call provider for medication related questions as evidenced by self report and chart notations    attend all scheduled medical appointments: as scheduled/recommended as evidenced by self report, chart notation        through collaboration with RN Care manager, provider, and care team.   Interventions: 1:1 collaboration with primary care provider regarding development and update of comprehensive plan of care as evidenced by provider attestation and co-signature Inter-disciplinary care team collaboration (see longitudinal plan of care) Evaluation of current treatment plan related to  self management and patient's adherence to plan as established by provider  COPD Interventions: Long Term: Goal on track: Yes  Reviewed upcoming appointments Encouraged to continue to take medications as recommended.  Heart Failure Interventions: Long Term: Goal on track: yes Discussed importance  of daily weight and advised patient to weigh and record daily Discussed signs/symptoms of congestive heart failure and when to call the doctor   Discussed upcoming appointment with cardiology scheduled for tomorrow 08/15/21 Encouraged to continue to eat Healthy and minimize salt  Patient Goals/Self-Care Activities: Take medications as prescribed   follow rescue plan if symptoms flare-up eat more whole grains, fruits and vegetables, lean meats and healthy fats know when to call the doctor call for weight gain 3 pounds  in a day or 5 pounds in one week; more swelling of your feet, ankles, stomach or hands; it is harder for you to breath when lying down and you need to sit up; Chest discomfort, heaviness or pain; you feel more tired or have less energy than normal, new or worsening dizziness, dry hacky cough, you feel uneasy and you know something is not right. dress right for the weather, hot or cold Weight and record weights daily. Attend follow up appointments as scheduled     Plan:Telephone follow up appointment with care management team member scheduled for:  09/18/21 The patient has been provided with contact information for the care management team and has been advised to call with any health related questions or concerns.   Thea Silversmith, RN, MSN, BSN, CCM Care Management Coordinator [facility] [phone]

## 2021-08-14 NOTE — Progress Notes (Signed)
ADVANCED HF CLINIC NOTE PCP: Copland, Gay Filler, MD HF Cardiologist: Dr. Haroldine Laws  HPI: Collin Young is a 70 y.o.male w/ chronic combined systolic and diastolic CHF/ ischemic CM, severe multivessel CAD treated medically, 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     HF dates back to 12/2016. Echo then w/ severely reduced LVEF 10-15%, RV mild-mod reduced, mod AI/MR/TR. LHC showed severe calcific disease in the proximal to mid RCA and chronic total occlusion of the proximal circumflex.There was consideration of RCA PCI.  However, this would have required hemodynamic support and was deferred given his frailty and comorbidities. CAD managed medically.    Repeat Echo 05/2017, EF improved up to 45-50%, Mod AI. RV normal.     Echo 05/2020 EF down again, 35-40%. RV normal    Admitted to [facility] 9/22 w/ a/c CHF. Echo EF down 25-30%, RV not well visualized. Mod MR/AI. Chest CT showed  large right pleural effusion.  There was questionable PE on his CT but ultimately this was thought to be just artifact with poor opacification. Given w/ IV Lasix and discharged home on Entresto, Farxiga, Toprol XL and PRN lasix.    Presented back to [facility] ED on 06/11/21 via EMS w/ acute SOB and hypoxic respiratory failure. Repeat chest CT showed rt lower lobe PE + large rt pleural effusion and small left pleural effusion. Underwent Rt Thoracentesis, fluid analysis c/w transudative effusion. Heparin started for PE, eventually switched to Eliquis. Cardiology consulted for a/c CHF. Started on IV Lasix for diuresis. Limited Echo repeated, EF 20%, global HK, RV normal. Concern for low output and transferred to [facility] for RHC, which showed cardiogenic shock with R>L heart failure. Milrinone started. GDMT titrated and able to wean milrinone off. Not felt to be a VAD candidate due to cachexia and RV failure. Discharged home, weight 121 lbs.   Had post hospital  f/u 07/04/21. Doing ok w/ NYHA Class II symptoms. Wt was up 4 lb post hospital and ReDs clip was elevated at 36%. Entresto was increased to 49-51 bid and Lasix was added 2 days a week, qMon + qFri. Digoxin was also reduced to 0.0625 due to elevated level (1.0).    Admitted 07/07/21 with acute on chronic CHF and started on IV Lasix. Repeat echo showed ED 25-30%, severe LV dysfunction, grade I DD, RV mildly reduced.  Transitioned to po lasix 40 mg daily. Continued on GDMT, discharge weight 122 lbs. Will need formal assessment to determine VAD candidacy but suspect limited due to RV failure and cachexia.   Today he returns for HF follow up. Overall feeling fine. He is not SOB with stairs or walking on flat ground. Denies palpitations, abnormal bleeding, CP, dizziness, edema, or PND/Orthopnea. Appetite ok. No fever or chills. Weight at home 119-120 pounds. Has been off ivabradine for a couple weeks, ran out of refills.   Cardiac Studies Echo 10/22 EF 20%, global HK, RV mildly reduced Echo 11/22 EF 25-30%, global HK, RV mildly reduced.  Past Medical History:  Diagnosis Date   Chronic combined systolic and diastolic CHF (congestive heart failure) ([place])    followed by [name]   COPD (chronic obstructive pulmonary disease) ([place])    Coronary artery disease cardiologist-- [name]   per cardiac cath 01-05-2017  chronic total occlusion pLCx with collaterals, 99% severe calcified prox. to mid RCA, otherwise mild to moderate CAD (medically managed)   Esophageal cancer,  stage IIIB [facility]) oncologist-  dr ennever/  dr moody   dx 2010  SCC Stage IIIB completed chemoradiation;  localized recurrent left piriform sinus 01/ 2015,  completed concurrent chemoradiatoin 04/ 2015   GERD (gastroesophageal reflux disease)    09-07-2018   no issues since Gtube removed 06/ 2019   History of alcohol abuse    quit 2001   History of cancer chemotherapy    2010;   2015   History of radiation therapy     10-19-2013 to  12-05-2013 pyriform sinus 69.96 Gy/41fx;   Radiation completed 2010 for esophageal cancer   History of seizure 2001   alcohol withdrawal   HOH (hard of hearing)    Hyperlipidemia    Hypertension    Ischemic cardiomyopathy 12/2016   01-03-2017  echo,  ef 10-15%/   echo 05-06-2017 EF improved to 45-50%   Prostate cancer [facility]) urologist-  dr ottelin/  oncologist-- [name]   dx 05-27-2018--- Stage T1c,  Gleason 4+3,  PSA 9.3--  scheduled for brachytherapy 09-23-2017   Renal cyst, left    Current Outpatient Medications  Medication Sig Dispense Refill   albuterol (VENTOLIN HFA) 108 (90 Base) MCG/ACT inhaler INHALE 2 PUFFS INTO THE LUNGS EVERY 6 HOURS AS NEEDED FOR WHEEZING OR SHORTNESS OF BREATH 20.1 g 1   apixaban (ELIQUIS) 5 MG TABS tablet Take 1 tablet (5 mg total) by mouth 2 (two) times daily. 60 tablet 6   BAYER LOW DOSE 81 MG EC tablet Take 81 mg by mouth daily. Swallow whole.     dapagliflozin propanediol (FARXIGA) 10 MG TABS tablet Take 1 tablet (10 mg total) by mouth daily. 30 tablet 2   Digoxin 62.5 MCG TABS Take 0.0625 mg by mouth daily. 30 tablet 6   Evolocumab (REPATHA SURECLICK) 992 MG/ML SOAJ Inject 140 mg into the skin every 14 (fourteen) days. 2 mL 11   fentaNYL (DURAGESIC) 12 MCG/HR Place 1 patch onto the skin every 3 (three) days. 10 patch 0   furosemide (LASIX) 40 MG tablet Take 1 tablet (40 mg total) by mouth daily. 30 tablet 0   hydrALAZINE (APRESOLINE) 25 MG tablet Take 1.5 tablets (37.5 mg total) by mouth every 8 (eight) hours. 135 tablet 0   ipratropium (ATROVENT) 0.03 % nasal spray Place 2 sprays into both nostrils every 12 (twelve) hours. 30 mL 12   isosorbide mononitrate (IMDUR) 30 MG 24 hr tablet Take 1 tablet (30 mg total) by mouth daily. 30 tablet 3   Multiple Vitamin (MULTIVITAMIN) tablet Take 1 tablet by mouth 3 (three) times a week.     polyethylene glycol (MIRALAX / GLYCOLAX) 17 g packet Take 17 g by mouth daily as needed for mild constipation.  14 each 0   pravastatin (PRAVACHOL) 80 MG tablet Take 1 tablet (80 mg total) by mouth every evening. 90 tablet 3   sacubitril-valsartan (ENTRESTO) 49-51 MG Take 1 tablet by mouth 2 (two) times daily. 60 tablet 11   spironolactone (ALDACTONE) 25 MG tablet Take 1 tablet (25 mg total) by mouth daily. 30 tablet 6   No current facility-administered medications for this encounter.   Facility-Administered Medications Ordered in Other Encounters  Medication Dose Route Frequency Provider Last Rate Last Admin   topical emolient (BIAFINE) emulsion   Topical Daily Kyung Rudd, MD   Given at 01/19/14 0912   No Known Allergies  Social History   Socioeconomic History   Marital status: Legally Separated    Spouse name: Not on file  Number of children: 2   Years of education: Not on file   Highest education level: Not on file  Occupational History   Occupation: retired  Tobacco Use   Smoking status: Former    Packs/day: 1.00    Years: 40.00    Pack years: 40.00    Types: Cigarettes    Start date: 11/08/1960    Quit date: 08/31/2009    Years since quitting: 11.9   Smokeless tobacco: Former    Types: Chew    Quit date: 09/01/1999  Vaping Use   Vaping Use: Never used  Substance and Sexual Activity   Alcohol use: Not Currently    Alcohol/week: 0.0 standard drinks    Comment: quit in 2001   Drug use: No    Comment: back in the day used cocaine,alcohol, marijuana   Sexual activity: Yes    Partners: Female    Birth control/protection: None  Other Topics Concern   Not on file  Social History Narrative   Not on file   Social Determinants of Health   Financial Resource Strain: Not on file  Food Insecurity: No Food Insecurity   Worried About Charity fundraiser in the Last Year: Never true   [place] in the Last Year: Never true  Transportation Needs: No Transportation Needs   Lack of Transportation (Medical): No   Lack of Transportation (Non-Medical): No  Physical Activity: Not on  file  Stress: Not on file  Social Connections: Not on file  Intimate Partner Violence: Not on file   Family History  Problem Relation Age of Onset   Breast cancer Mother    Prostate cancer Neg Hx    Colon cancer Neg Hx    Pancreatic cancer Neg Hx    BP 100/60    Pulse 96    Wt 57.2 kg    SpO2 98%    BMI 20.34 kg/m   Wt Readings from Last 3 Encounters:  08/15/21 57.2 kg  08/01/21 58.1 kg  07/17/21 56 kg   PHYSICAL EXAM: General:  NAD. No resp difficulty, walked into clinic, thin HEENT: Hoarse, +HOH/bilateral hearing aids Neck: Supple. No JVD. Carotids 2+ bilat; no bruits. No lymphadenopathy or thryomegaly appreciated. Cor: PMI nondisplaced. Regular rate & rhythm. No rubs, gallops or murmurs. Lungs: Clear Abdomen: Soft, nontender, nondistended. No hepatosplenomegaly. No bruits or masses. Good bowel sounds. Extremities: No cyanosis, clubbing, rash, edema Neuro: Alert & oriented x 3, cranial nerves grossly intact. Moves all 4 extremities w/o difficulty. Affect pleasant.   ASSESSMENT & PLAN:  1.  Chronic Systolic Heart Failure  - Echo 2018 EF 10-15%, RV moderately reduced. LHC w/ MVCAD management medically - Echo 05/2020 EF down again, 35-40%. RV normal  - Echo 05/2021 EF down 25-30%, RV not well visualized. Mod MR and Mod AI. - Limited Echo 10/22: EF 20%, RV normal  - Recent admit 10/22 w/ a/c CHF w/ cardiogenic shock w/ AKI + Shock liver - RHC 06/16/21 w/ R>L HF and low output, CI 1.8. PAPi 1.3. Required milrinone but improved and able to wean off - Echo 11/22: EF 25-30%, global HK, RV mildly reduced. - cMRI 12/22, results pending. - Better NYHA II symptoms, volume looks good today. - Restart ivabradine 2.5 mg bid. HR 96 today. - Continue Lasix 40 mg daily.  - Continue Entresto 49/51 mg bid. - Continue hydral 37.5 mg tid + Imdur 30 mg daily.  - Continue spiro 25 mg daily.  - Continue Farxiga 10 mg  daily. - Continue digoxin 0.0625 (on reduced dose due to elevated level)  -  Not felt to be VAD candidate due to cachexia and RV failure. Can reconsider as he improves. May need another RHC at some point. - Repeat Echo - BMET and digoxin level today.   2. Pulmonary Embolism  - Diagnosed 10/22 - Limited echo w/ no RV strain  - Suspect incidental due HF. - On Eliquis 5 mg bid, reports full compliance.  - No bleeding issues, recent CBC stable.   3. CKD Stage IIIa - On SGLT2i - BMET today.   4. H/o Pleural Effusion, right - s/p thoracentesis by 06/11/21, 1200 cc removed, transudative  - CXR this admit w/ bilateral pleural effusions, at least moderate on the right - Dyspnea improved with diuresis.   5. H/o esophageal CA and throat CA - s/p chemo and XRT.   6. H/o Hyponatremia - Likely 2/2 previous hypervolemia - BMET today.   Follow up with Dr. Haroldine Laws + echo in 2 months as scheduled.   Allena Katz, FNP-BC 08/15/21

## 2021-08-15 ENCOUNTER — Other Ambulatory Visit: Payer: Self-pay

## 2021-08-15 ENCOUNTER — Ambulatory Visit (HOSPITAL_COMMUNITY)
Admission: RE | Admit: 2021-08-15 | Discharge: 2021-08-15 | Disposition: A | Discharge status: Home / Self Care | Payer: Medicare Other | Source: Ambulatory Visit | Attending: Family Medicine | Admitting: Family Medicine

## 2021-08-15 ENCOUNTER — Encounter (HOSPITAL_COMMUNITY): Payer: Self-pay

## 2021-08-15 VITALS — BP 100/60 | HR 96 | Wt 126.0 lb

## 2021-08-15 DIAGNOSIS — Z85819 Personal history of malignant neoplasm of unspecified site of lip, oral cavity, and pharynx: Secondary | ICD-10-CM | POA: Insufficient documentation

## 2021-08-15 DIAGNOSIS — I5042 Chronic combined systolic (congestive) and diastolic (congestive) heart failure: Secondary | ICD-10-CM | POA: Diagnosis not present

## 2021-08-15 DIAGNOSIS — Z9889 Other specified postprocedural states: Secondary | ICD-10-CM | POA: Insufficient documentation

## 2021-08-15 DIAGNOSIS — I5040 Unspecified combined systolic (congestive) and diastolic (congestive) heart failure: Secondary | ICD-10-CM

## 2021-08-15 DIAGNOSIS — I130 Hypertensive heart and chronic kidney disease with heart failure and stage 1 through stage 4 chronic kidney disease, or unspecified chronic kidney disease: Secondary | ICD-10-CM | POA: Diagnosis not present

## 2021-08-15 DIAGNOSIS — Z923 Personal history of irradiation: Secondary | ICD-10-CM | POA: Insufficient documentation

## 2021-08-15 DIAGNOSIS — Z8709 Personal history of other diseases of the respiratory system: Secondary | ICD-10-CM

## 2021-08-15 DIAGNOSIS — I2510 Atherosclerotic heart disease of native coronary artery without angina pectoris: Secondary | ICD-10-CM | POA: Diagnosis not present

## 2021-08-15 DIAGNOSIS — Z8501 Personal history of malignant neoplasm of esophagus: Secondary | ICD-10-CM | POA: Diagnosis not present

## 2021-08-15 DIAGNOSIS — Z7984 Long term (current) use of oral hypoglycemic drugs: Secondary | ICD-10-CM | POA: Diagnosis not present

## 2021-08-15 DIAGNOSIS — Z8546 Personal history of malignant neoplasm of prostate: Secondary | ICD-10-CM | POA: Diagnosis not present

## 2021-08-15 DIAGNOSIS — N183 Chronic kidney disease, stage 3 unspecified: Secondary | ICD-10-CM

## 2021-08-15 DIAGNOSIS — R64 Cachexia: Secondary | ICD-10-CM | POA: Diagnosis not present

## 2021-08-15 DIAGNOSIS — I255 Ischemic cardiomyopathy: Secondary | ICD-10-CM | POA: Diagnosis not present

## 2021-08-15 DIAGNOSIS — Z6820 Body mass index (BMI) 20.0-20.9, adult: Secondary | ICD-10-CM | POA: Diagnosis not present

## 2021-08-15 DIAGNOSIS — I2699 Other pulmonary embolism without acute cor pulmonale: Secondary | ICD-10-CM

## 2021-08-15 DIAGNOSIS — Z7901 Long term (current) use of anticoagulants: Secondary | ICD-10-CM | POA: Diagnosis not present

## 2021-08-15 DIAGNOSIS — J90 Pleural effusion, not elsewhere classified: Secondary | ICD-10-CM | POA: Insufficient documentation

## 2021-08-15 DIAGNOSIS — N1831 Chronic kidney disease, stage 3a: Secondary | ICD-10-CM | POA: Diagnosis not present

## 2021-08-15 DIAGNOSIS — Z79899 Other long term (current) drug therapy: Secondary | ICD-10-CM | POA: Insufficient documentation

## 2021-08-15 DIAGNOSIS — E785 Hyperlipidemia, unspecified: Secondary | ICD-10-CM | POA: Diagnosis not present

## 2021-08-15 DIAGNOSIS — J449 Chronic obstructive pulmonary disease, unspecified: Secondary | ICD-10-CM | POA: Insufficient documentation

## 2021-08-15 DIAGNOSIS — Z9221 Personal history of antineoplastic chemotherapy: Secondary | ICD-10-CM | POA: Insufficient documentation

## 2021-08-15 DIAGNOSIS — E871 Hypo-osmolality and hyponatremia: Secondary | ICD-10-CM | POA: Diagnosis not present

## 2021-08-15 DIAGNOSIS — I5022 Chronic systolic (congestive) heart failure: Secondary | ICD-10-CM

## 2021-08-15 LAB — BASIC METABOLIC PANEL
Anion gap: 7 (ref 5–15)
BUN: 22 mg/dL (ref 8–23)
CO2: 34 mmol/L — ABNORMAL HIGH (ref 22–32)
Calcium: 9.4 mg/dL (ref 8.9–10.3)
Chloride: 95 mmol/L — ABNORMAL LOW (ref 98–111)
Creatinine, Ser: 1.27 mg/dL — ABNORMAL HIGH (ref 0.61–1.24)
GFR, Estimated: >60 mL/min (ref >60)
Glucose, Bld: 94 mg/dL (ref 70–99)
Potassium: 4.7 mmol/L (ref 3.5–5.1)
Sodium: 136 mmol/L (ref 135–145)

## 2021-08-15 LAB — DIGOXIN LEVEL: Digoxin Level: 0.5 ng/mL — ABNORMAL LOW (ref 0.8–2.0)

## 2021-08-15 MED ORDER — HYDRALAZINE HCL 25 MG PO TABS: 37.5000 mg | ORAL_TABLET | Freq: Three times a day (TID) | ORAL | 3 refills | Status: DC

## 2021-08-15 MED ORDER — IVABRADINE HCL 5 MG PO TABS: 2.5000 mg | ORAL_TABLET | Freq: Two times a day (BID) | ORAL | 3 refills | Status: DC

## 2021-08-15 MED ORDER — DAPAGLIFLOZIN PROPANEDIOL 10 MG PO TABS: 10.0000 mg | ORAL_TABLET | Freq: Every day | ORAL | 2 refills | Status: DC

## 2021-08-15 MED ORDER — FUROSEMIDE 40 MG PO TABS: 40.0000 mg | ORAL_TABLET | Freq: Every day | ORAL | 3 refills | Status: DC

## 2021-08-15 NOTE — Patient Instructions (Addendum)
RESTART Corlanor 2.5 mg one tab twice a day'  Labs today We will only contact you if something comes back abnormal or we need to make some changes. Otherwise no news is good news!   Your physician has requested that you regularly monitor and record your blood pressure readings at home. Please use the same machine at the same time of day to check your readings and record them to bring to your follow-up visit.   Keep follow up as scheduled with cardiology  Do the following things EVERYDAY: Weigh yourself in the morning before breakfast. Write it down and keep it in a log. Take your medicines as prescribed Eat low salt foods--Limit salt (sodium) to 2000 mg per day.  Stay as active as you can everyday Limit all fluids for the day to less than 2 liters   At the [facility], you and your health needs are our priority. As part of our continuing mission to provide you with exceptional heart care, we have created designated Provider Care Teams. These Care Teams include your primary Cardiologist (physician) and Advanced Practice Providers (APPs- Physician Assistants and Nurse Practitioners) who all work together to provide you with the care you need, when you need it.   You may see any of the following providers on your designated Care Team at your next follow up: Dr Glori Bickers Dr Haynes Kerns, NP Lyda Jester, [place] [facility] [place], [place] Audry Riles, PharmD   Please be sure to bring in all your medications bottles to every appointment.

## 2021-08-29 ENCOUNTER — Ambulatory Visit (INDEPENDENT_AMBULATORY_CARE_PROVIDER_SITE_OTHER): Payer: Medicare Other

## 2021-08-29 VITALS — BP 100/58 | HR 96 | Temp 97.4°F | Resp 16 | Ht 66.0 in | Wt 132.6 lb

## 2021-08-29 DIAGNOSIS — Z0000 Encounter for general adult medical examination without abnormal findings: Secondary | ICD-10-CM

## 2021-08-29 NOTE — Patient Instructions (Addendum)
Collin Young , Thank you for taking time to come for your Medicare Wellness Visit. I appreciate your ongoing commitment to your health goals. Please review the following plan we discussed and let me know if I can assist you in the future.   Screening recommendations/referrals: Colonoscopy: Completed 02/13/2020-Due 02/12/2030 Recommended yearly ophthalmology/optometry visit for glaucoma screening and checkup Recommended yearly dental visit for hygiene and checkup  Vaccinations: Influenza vaccine: Up to date Pneumococcal vaccine: Due-May obtain vaccine at our office or your local pharmacy. Tdap vaccine: Up to date Shingles vaccine: Discuss with pharmacy   Covid-19: Up to date  Advanced directives: Copy in chart  Conditions/risks identified: See problem list  Next appointment: Follow up in one year for your annual wellness visit. 09/03/2022 @ 1:00.  Preventive Care 70 Years and Older, Male Preventive care refers to lifestyle choices and visits with your health care provider that can promote health and wellness. What does preventive care include? A yearly physical exam. This is also called an annual well check. Dental exams once or twice a year. Routine eye exams. Ask your health care provider how often you should have your eyes checked. Personal lifestyle choices, including: Daily care of your teeth and gums. Regular physical activity. Eating a healthy diet. Avoiding tobacco and drug use. Limiting alcohol use. Practicing safe sex. Taking low doses of aspirin every day. Taking vitamin and mineral supplements as recommended by your health care provider. What happens during an annual well check? The services and screenings done by your health care provider during your annual well check will depend on your age, overall health, lifestyle risk factors, and family history of disease. Counseling  Your health care provider may ask you questions about your: Alcohol use. Tobacco use. Drug  use. Emotional well-being. Home and relationship well-being. Sexual activity. Eating habits. History of falls. Memory and ability to understand (cognition). Work and work Statistician. Screening  You may have the following tests or measurements: Height, weight, and BMI. Blood pressure. Lipid and cholesterol levels. These may be checked every 5 years, or more frequently if you are over 2 years old. Skin check. Lung cancer screening. You may have this screening every year starting at age 70 if you have a 30-pack-year history of smoking and currently smoke or have quit within the past 15 years. Fecal occult blood test (FOBT) of the stool. You may have this test every year starting at age 70. Flexible sigmoidoscopy or colonoscopy. You may have a sigmoidoscopy every 5 years or a colonoscopy every 10 years starting at age 70. Prostate cancer screening. Recommendations will vary depending on your family history and other risks. Hepatitis C blood test. Hepatitis B blood test. Sexually transmitted disease (STD) testing. Diabetes screening. This is done by checking your blood sugar (glucose) after you have not eaten for a while (fasting). You may have this done every 1-3 years. Abdominal aortic aneurysm (AAA) screening. You may need this if you are a current or former smoker. Osteoporosis. You may be screened starting at age 70 if you are at high risk. Talk with your health care provider about your test results, treatment options, and if necessary, the need for more tests. Vaccines  Your health care provider may recommend certain vaccines, such as: Influenza vaccine. This is recommended every year. Tetanus, diphtheria, and acellular pertussis (Tdap, Td) vaccine. You may need a Td booster every 10 years. Zoster vaccine. You may need this after age 70. Pneumococcal 13-valent conjugate (PCV13) vaccine. One dose is recommended after  age 70. Pneumococcal polysaccharide (PPSV23) vaccine. One dose is  recommended after age 70. Talk to your health care provider about which screenings and vaccines you need and how often you need them. This information is not intended to replace advice given to you by your health care provider. Make sure you discuss any questions you have with your health care provider. Document Released: 09/13/2015 Document Revised: 05/06/2016 Document Reviewed: 06/18/2015 Elsevier Interactive Patient Education  2017 [place] Prevention in the Home Falls can cause injuries. They can happen to people of all ages. There are many things you can do to make your home safe and to help prevent falls. What can I do on the outside of my home? Regularly fix the edges of walkways and driveways and fix any cracks. Remove anything that might make you trip as you walk through a door, such as a raised step or threshold. Trim any bushes or trees on the path to your home. Use bright outdoor lighting. Clear any walking paths of anything that might make someone trip, such as rocks or tools. Regularly check to see if handrails are loose or broken. Make sure that both sides of any steps have handrails. Any raised decks and porches should have guardrails on the edges. Have any leaves, snow, or ice cleared regularly. Use sand or salt on walking paths during winter. Clean up any spills in your garage right away. This includes oil or grease spills. What can I do in the bathroom? Use night lights. Install grab bars by the toilet and in the tub and shower. Do not use towel bars as grab bars. Use non-skid mats or decals in the tub or shower. If you need to sit down in the shower, use a plastic, non-slip stool. Keep the floor dry. Clean up any water that spills on the floor as soon as it happens. Remove soap buildup in the tub or shower regularly. Attach bath mats securely with double-sided non-slip rug tape. Do not have throw rugs and other things on the floor that can make you  trip. What can I do in the bedroom? Use night lights. Make sure that you have a light by your bed that is easy to reach. Do not use any sheets or blankets that are too big for your bed. They should not hang down onto the floor. Have a firm chair that has side arms. You can use this for support while you get dressed. Do not have throw rugs and other things on the floor that can make you trip. What can I do in the kitchen? Clean up any spills right away. Avoid walking on wet floors. Keep items that you use a lot in easy-to-reach places. If you need to reach something above you, use a strong step stool that has a grab bar. Keep electrical cords out of the way. Do not use floor polish or wax that makes floors slippery. If you must use wax, use non-skid floor wax. Do not have throw rugs and other things on the floor that can make you trip. What can I do with my stairs? Do not leave any items on the stairs. Make sure that there are handrails on both sides of the stairs and use them. Fix handrails that are broken or loose. Make sure that handrails are as long as the stairways. Check any carpeting to make sure that it is firmly attached to the stairs. Fix any carpet that is loose or worn. Avoid having throw rugs  at the top or bottom of the stairs. If you do have throw rugs, attach them to the floor with carpet tape. Make sure that you have a light switch at the top of the stairs and the bottom of the stairs. If you do not have them, ask someone to add them for you. What else can I do to help prevent falls? Wear shoes that: Do not have high heels. Have rubber bottoms. Are comfortable and fit you well. Are closed at the toe. Do not wear sandals. If you use a stepladder: Make sure that it is fully opened. Do not climb a closed stepladder. Make sure that both sides of the stepladder are locked into place. Ask someone to hold it for you, if possible. Clearly mark and make sure that you can  see: Any grab bars or handrails. First and last steps. Where the edge of each step is. Use tools that help you move around (mobility aids) if they are needed. These include: Canes. Walkers. Scooters. Crutches. Turn on the lights when you go into a dark area. Replace any light bulbs as soon as they burn out. Set up your furniture so you have a clear path. Avoid moving your furniture around. If any of your floors are uneven, fix them. If there are any pets around you, be aware of where they are. Review your medicines with your doctor. Some medicines can make you feel dizzy. This can increase your chance of falling. Ask your doctor what other things that you can do to help prevent falls. This information is not intended to replace advice given to you by your health care provider. Make sure you discuss any questions you have with your health care provider. Document Released: 06/13/2009 Document Revised: 01/23/2016 Document Reviewed: 09/21/2014 Elsevier Interactive Patient Education  2017 [employer].

## 2021-08-29 NOTE — Progress Notes (Signed)
Subjective:   Collin Young is a 70 y.o. male who presents for an Initial Medicare Annual Wellness Visit.   Review of Systems     Cardiac Risk Factors include: advanced age (>88men, >30 women);hypertension;male gender;dyslipidemia     Objective:    Today's Vitals   08/29/21 1256  BP: (!) 100/58  Pulse: 96  Resp: 16  Temp: (!) 97.4 F (36.3 C)  TempSrc: Oral  SpO2: 97%  Weight: 132 lb 9.6 oz (60.1 kg)  Height: 5\' 6"  (1.676 m)   Body mass index is 21.4 kg/m.  Advanced Directives 08/29/2021 08/01/2021 07/17/2021 06/11/2021 06/02/2021 06/02/2021 05/08/2021  Does Patient Have a Medical Advance Directive? Yes No Yes No No No No  Type of Paramedic of [place];Living will - [place];Living will - - - -  Does patient want to make changes to medical advance directive? - No - Patient declined No - Patient declined - - - -  Copy of [place] in Chart? Yes - validated most recent copy scanned in chart (See row information) - - - - - -  Would patient like information on creating a medical advance directive? - - - No - Patient declined No - Patient declined - No - Patient declined  Pre-existing out of facility DNR order (yellow form or pink MOST form) - - - - - - -    Current Medications (verified) Outpatient Encounter Medications as of 08/29/2021  Medication Sig   albuterol (VENTOLIN HFA) 108 (90 Base) MCG/ACT inhaler INHALE 2 PUFFS INTO THE LUNGS EVERY 6 HOURS AS NEEDED FOR WHEEZING OR SHORTNESS OF BREATH   BAYER LOW DOSE 81 MG EC tablet Take 81 mg by mouth daily. Swallow whole.   dapagliflozin propanediol (FARXIGA) 10 MG TABS tablet Take 1 tablet (10 mg total) by mouth daily.   Evolocumab (REPATHA SURECLICK) 466 MG/ML SOAJ Inject 140 mg into the skin every 14 (fourteen) days.   fentaNYL (DURAGESIC) 12 MCG/HR Place 1 patch onto the skin every 3 (three) days.   furosemide (LASIX) 40 MG tablet Take 1 tablet (40 mg total) by  mouth daily.   hydrALAZINE (APRESOLINE) 25 MG tablet Take 1.5 tablets (37.5 mg total) by mouth every 8 (eight) hours.   ipratropium (ATROVENT) 0.03 % nasal spray Place 2 sprays into both nostrils every 12 (twelve) hours.   ivabradine (CORLANOR) 5 MG TABS tablet Take 0.5 tablets (2.5 mg total) by mouth 2 (two) times daily with a meal.   Multiple Vitamin (MULTIVITAMIN) tablet Take 1 tablet by mouth 3 (three) times a week.   polyethylene glycol (MIRALAX / GLYCOLAX) 17 g packet Take 17 g by mouth daily as needed for mild constipation.   pravastatin (PRAVACHOL) 80 MG tablet Take 1 tablet (80 mg total) by mouth every evening.   sacubitril-valsartan (ENTRESTO) 49-51 MG Take 1 tablet by mouth 2 (two) times daily.   apixaban (ELIQUIS) 5 MG TABS tablet Take 1 tablet (5 mg total) by mouth 2 (two) times daily.   Digoxin 62.5 MCG TABS Take 0.0625 mg by mouth daily.   isosorbide mononitrate (IMDUR) 30 MG 24 hr tablet Take 1 tablet (30 mg total) by mouth daily.   spironolactone (ALDACTONE) 25 MG tablet Take 1 tablet (25 mg total) by mouth daily.   Facility-Administered Encounter Medications as of 08/29/2021  Medication   topical emolient (BIAFINE) emulsion    Allergies (verified) Patient has no known allergies.   History: Past Medical History:  Diagnosis  Date   Chronic combined systolic and diastolic CHF (congestive heart failure) ([place])    followed by [name]   COPD (chronic obstructive pulmonary disease) ([place])    Coronary artery disease cardiologist-- [name]   per cardiac cath 01-05-2017  chronic total occlusion pLCx with collaterals, 99% severe calcified prox. to mid RCA, otherwise mild to moderate CAD (medically managed)   Esophageal cancer, stage IIIB [facility]) oncologist-  dr ennever/  dr moody   dx 2010  SCC Stage IIIB completed chemoradiation;  localized recurrent left piriform sinus 01/ 2015,  completed concurrent chemoradiatoin 04/ 2015   GERD (gastroesophageal reflux  disease)    09-07-2018   no issues since Gtube removed 06/ 2019   History of alcohol abuse    quit 2001   History of cancer chemotherapy    2010;   2015   History of radiation therapy    10-19-2013 to  12-05-2013 pyriform sinus 69.96 Gy/19fx;   Radiation completed 2010 for esophageal cancer   History of seizure 2001   alcohol withdrawal   HOH (hard of hearing)    Hyperlipidemia    Hypertension    Ischemic cardiomyopathy 12/2016   01-03-2017  echo,  ef 10-15%/   echo 05-06-2017 EF improved to 45-50%   Prostate cancer [facility]) urologist-  dr ottelin/  oncologist-- [name]   dx 05-27-2018--- Stage T1c,  Gleason 4+3,  PSA 9.3--  scheduled for brachytherapy 09-23-2017   Renal cyst, left    Past Surgical History:  Procedure Laterality Date   CARDIOVASCULAR STRESS TEST  10/09/09   normal nuclearr stress test, EF 57% [name])   CENTRAL LINE INSERTION  06/16/2021   Procedure: CENTRAL LINE INSERTION;  Surgeon: Jolaine Artist, MD;  Location: [place] [facility];  Service: Cardiovascular;;   IR GASTROSTOMY TUBE MOD SED  01/13/2017   IR GASTROSTOMY TUBE REMOVAL  02/03/2018   IR PATIENT EVAL TECH 0-60 MINS  03/25/2017   IR REMOVAL TUN ACCESS W/ PORT W/O FL MOD SED  01/15/2017   IR REPLACE G-TUBE SIMPLE WO FLUORO  01/13/2018   LARYNGOSCOPY N/A 09/15/2013   Procedure: LARYNGOSCOPY;  Surgeon: [name], MD;  Location: [facility];  Service: ENT;  Laterality: N/A;  direct laryngoscopy with biopsy and esophagoscopy   RADIOACTIVE SEED IMPLANT N/A 09/23/2018   Procedure: RADIOACTIVE SEED IMPLANT/BRACHYTHERAPY IMPLANT;  Surgeon: [name], MD;  Location: [facility];  Service: Urology;  Laterality: N/A;   RIGHT HEART CATH N/A 06/16/2021   Procedure: RIGHT HEART CATH;  Surgeon: Jolaine Artist, MD;  Location: [facility];  Service: Cardiovascular;  Laterality: N/A;   RIGHT/LEFT HEART CATH AND CORONARY ANGIOGRAPHY N/A 01/05/2017   Procedure: Right/Left Heart Cath and Coronary  Angiography;  Surgeon: [name], MD;  Location: [facility];  Service: Cardiovascular;  Laterality: N/A;   TRANSTHORACIC ECHOCARDIOGRAM  05/06/2017   ef 45-50%,  grade 1 diastolic dysfunction/  AV severe calcified non coronary cusp with moderate regurg. , no stenosis (valve area per echo 01-05-2017 1.08cm^2)/  mild MR, TR, and PR/  mild LAE   Family History  Problem Relation Age of Onset   Breast cancer Mother    Prostate cancer Neg Hx    Colon cancer Neg Hx    Pancreatic cancer Neg Hx    Social History   Socioeconomic History   Marital status: Legally Separated    Spouse name: Not on file   Number of children: 2  Years of education: Not on file   Highest education level: Not on file  Occupational History   Occupation: retired  Tobacco Use   Smoking status: Former    Packs/day: 1.00    Years: 40.00    Pack years: 40.00    Types: Cigarettes    Start date: 11/08/1960    Quit date: 08/31/2009    Years since quitting: 12.0   Smokeless tobacco: Former    Types: Chew    Quit date: 09/01/1999  Vaping Use   Vaping Use: Never used  Substance and Sexual Activity   Alcohol use: Not Currently    Alcohol/week: 0.0 standard drinks    Comment: quit in 2001   Drug use: No    Comment: back in the day used cocaine,alcohol, marijuana   Sexual activity: Yes    Partners: Female    Birth control/protection: None  Other Topics Concern   Not on file  Social History Narrative   Not on file   Social Determinants of Health   Financial Resource Strain: Low Risk    Difficulty of Paying Living Expenses: Not hard at all  Food Insecurity: No Food Insecurity   Worried About Charity fundraiser in the Last Year: Never true   [place] in the Last Year: Never true  Transportation Needs: No Transportation Needs   Lack of Transportation (Medical): No   Lack of Transportation (Non-Medical): No  Physical Activity: Insufficiently Active   Days of Exercise per Week: 7 days    Minutes of Exercise per Session: 20 min  Stress: No Stress Concern Present   Feeling of Stress : Not at all  Social Connections: Moderately Integrated   Frequency of Communication with Friends and Family: More than three times a week   Frequency of Social Gatherings with Friends and Family: More than three times a week   Attends Religious Services: More than 4 times per year   Active Member of [employer] or Organizations: Yes   Attends Music therapist: More than 4 times per year   Marital Status: Separated    Tobacco Counseling Counseling given: Not Answered   Clinical Intake:  Pre-visit preparation completed: Yes  Pain : No/denies pain     BMI - recorded: 21.4 Nutritional Status: BMI of 19-24  Normal Nutritional Risks: None Diabetes: No  How often do you need to have someone help you when you read instructions, pamphlets, or other written materials from your doctor or pharmacy?: 1 - Never  Diabetic?No  Interpreter Needed?: No  Information entered by :: Caroleen Hamman LPN   Activities of Daily Living In your present state of health, do you have any difficulty performing the following activities: 08/29/2021 06/11/2021  Hearing? N -  [place]? N -  Difficulty concentrating or making decisions? N -  Walking or climbing stairs? N -  Comment - -  Dressing or bathing? N -  Doing errands, shopping? N N  Preparing Food and eating ? N -  Using the Toilet? N -  In the past six months, have you accidently leaked urine? N -  Do you have problems with loss of bowel control? N -  Managing your Medications? N -  Managing your Finances? N -  Housekeeping or managing your Housekeeping? N -  Some recent data might be hidden    Patient Care Team: Copland, Gay Filler, MD as PCP - General (Family Medicine) Skeet Latch, MD as PCP - Cardiology (Cardiology)  Erlene Quan, PA-C (Inactive) as Physician Assistant (Cardiology) [name], RN as  Case Manager  Indicate any recent Medical Services you may have received from other than [facility] providers in the past year (date may be approximate).     Assessment:   This is a routine wellness examination for [facility].  Hearing/Vision screen Hearing Screening - Comments:: Bilateral hearing aids Vision Screening - Comments:: Last eye exam-several years ago  Dietary issues and exercise activities discussed: Current Exercise Habits: Home exercise routine, Type of exercise: strength training/weights;stretching, Time (Minutes): 20, Frequency (Times/Week): 7, Weekly Exercise (Minutes/Week): 140, Intensity: Mild, Exercise limited by: None identified   Goals Addressed             This Visit's Progress    Patient Stated       Maintain current health       Depression Screen PHQ 2/9 Scores 08/29/2021 07/03/2021 03/28/2021 02/17/2021 01/03/2020 03/08/2019 09/26/2018  PHQ - 2 Score 0 0 0 0 0 0 0  PHQ- 9 Score - - - - 0 0 -    Fall Risk Fall Risk  08/29/2021 07/17/2021 07/03/2021 03/28/2021 02/17/2021  Falls in the past year? 0 0 0 0 0  Number falls in past yr: 0 - 0 0 -  Injury with Fall? - - 0 0 -  Risk for fall due to : - - No Fall Risks No Fall Risks -  Follow up Falls prevention discussed - Falls evaluation completed Falls evaluation completed -    FALL RISK PREVENTION PERTAINING TO THE HOME:  Any stairs in or around the home? No  Home free of loose throw rugs in walkways, pet beds, electrical cords, etc? Yes  Adequate lighting in your home to reduce risk of falls? Yes   ASSISTIVE DEVICES UTILIZED TO PREVENT FALLS:  Life alert? No  Use of a cane, walker or w/c? No  Grab bars in the bathroom? Yes  Shower chair or bench in shower? No  Elevated toilet seat or a handicapped toilet? No   TIMED UP AND GO:  Was the test performed? Yes .  Length of time to ambulate 10 feet: 11 sec.   Gait steady and fast without use of assistive device  Cognitive Function:Normal cognitive status  assessed by direct observation by this Nurse Health Advisor. No abnormalities found.          Immunizations Immunization History  Administered Date(s) Administered   Fluad Quad(high Dose 65+) 07/15/2020   Influenza, High Dose Seasonal PF 05/14/2021   Influenza,inj,Quad PF,6+ Mos 05/23/2019   Influenza-Unspecified 05/31/2017   Moderna Covid-19 Vaccine Bivalent Booster 56yrs & up 07/03/2021   Moderna SARS-COV2 Booster Vaccination 02/17/2021   Moderna Sars-Covid-2 Vaccination 11/08/2019, 12/06/2019, 09/27/2020   Pneumococcal Conjugate-13 08/19/2020   Tdap 12/18/2011    TDAP status: Up to date  Flu Vaccine status: Up to date  Pneumococcal vaccine status: Declined,  Education has been provided regarding the importance of this vaccine but patient still declined. Advised may receive this vaccine at local pharmacy or Health Dept. Aware to provide a copy of the vaccination record if obtained from local pharmacy or Health Dept. Verbalized acceptance and understanding.   Covid-19 vaccine status: Completed vaccines  Qualifies for Shingles Vaccine? Yes   Zostavax completed No   Shingrix Completed?: No.    Education has been provided regarding the importance of this vaccine. Patient has been advised to call insurance company to determine out of pocket expense if they have not yet received this  vaccine. Advised may also receive vaccine at local pharmacy or Health Dept. Verbalized acceptance and understanding.  Screening Tests Health Maintenance  Topic Date Due   Zoster Vaccines- Shingrix (1 of 2) Never done   Pneumonia Vaccine 52+ Years old (2 - PPSV23 if available, else PCV20) 08/19/2021   TETANUS/TDAP  12/17/2021   COLONOSCOPY (Pts 45-40yrs Insurance coverage will need to be confirmed)  02/12/2030   INFLUENZA VACCINE  Completed   COVID-19 Vaccine  Completed   Hepatitis C Screening  Completed   HPV VACCINES  Aged Out    Health Maintenance  Health Maintenance Due  Topic Date Due    Zoster Vaccines- Shingrix (1 of 2) Never done   Pneumonia Vaccine 8+ Years old (2 - PPSV23 if available, else PCV20) 08/19/2021    Colorectal cancer screening: Type of screening: Colonoscopy. Completed 02/13/2020. Repeat every 10 years  Lung Cancer Screening: (Low Dose CT Chest recommended if Age 68-80 years, 30 pack-year currently smoking OR have quit w/in 15years.) does not qualify.     Additional Screening:  Hepatitis C Screening: Completed 06/16/2021  Vision Screening: Recommended annual ophthalmology exams for early detection of glaucoma and other disorders of the eye. Is the patient up to date with their annual eye exam?  No  Who is the provider or what is the name of the office in which the patient attends annual eye exams? Unknown-patient advised to make an appt.   Dental Screening: Recommended annual dental exams for proper oral hygiene  Community Resource Referral / Chronic Care Management: CRR required this visit?  No   CCM required this visit?  No      Plan:     I have personally reviewed and noted the following in the patients chart:   Medical and social history Use of alcohol, tobacco or illicit drugs  Current medications and supplements including opioid prescriptions. Patient is currently taking opioid prescriptions. Information provided to patient regarding non-opioid alternatives. Patient advised to discuss non-opioid treatment plan with their provider. Functional ability and status Nutritional status Physical activity Advanced directives List of other physicians Hospitalizations, surgeries, and ER visits in previous 12 months Vitals Screenings to include cognitive, depression, and falls Referrals and appointments  In addition, I have reviewed and discussed with patient certain preventive protocols, quality metrics, and best practice recommendations. A written personalized care plan for preventive services as well as general preventive health  recommendations were provided to patient.     Marta Antu, LPN   [id number]  Nurse Health Advisor  Nurse Notes: None

## 2021-08-30 DIAGNOSIS — J449 Chronic obstructive pulmonary disease, unspecified: Secondary | ICD-10-CM | POA: Diagnosis not present

## 2021-08-30 DIAGNOSIS — I5023 Acute on chronic systolic (congestive) heart failure: Secondary | ICD-10-CM

## 2021-09-18 ENCOUNTER — Telehealth: Payer: Self-pay

## 2021-09-18 ENCOUNTER — Telehealth: Payer: Medicare Other

## 2021-09-18 NOTE — Telephone Encounter (Signed)
°  Care Management   Follow Up Note   09/18/2021 Name: Collin Young MRN: [id number] DOB: 1950/11/14   Referred by: Darreld Mclean, MD Reason for referral : Chronic Care Management   An unsuccessful telephone outreach was attempted today. The patient was referred to the case management team for assistance with care management and care coordination.  A HIPPA compliant phone message was left for the patient providing contact information and requesting a return call.   Follow Up Plan: The Care Management Team will reach out to patient again within the next 30 days.  Thea Silversmith, RN, MSN, BSN, CCM Care Management Coordinator [facility] [phone]

## 2021-09-22 ENCOUNTER — Ambulatory Visit (INDEPENDENT_AMBULATORY_CARE_PROVIDER_SITE_OTHER): Payer: Medicare Other

## 2021-09-22 DIAGNOSIS — I5040 Unspecified combined systolic (congestive) and diastolic (congestive) heart failure: Secondary | ICD-10-CM

## 2021-09-22 DIAGNOSIS — J449 Chronic obstructive pulmonary disease, unspecified: Secondary | ICD-10-CM

## 2021-09-22 NOTE — Patient Instructions (Addendum)
Visit Information  Thank you for taking time to visit with me today. Please don't hesitate to contact me if I can be of assistance to you before our next scheduled telephone appointment.  Following are the goals we discussed today:  Patient Goals/Self-Care Activities: Check blood pressure and record. Take with you to your provider visits. Ask your Doctor what is your Target blood pressure range Take medications as prescribed   follow rescue plan if symptoms flare-up eat more whole grains, fruits and vegetables, lean meats and healthy fats know when to call the doctor call for weight gain 3 pounds in a day or 5 pounds in one week; more swelling of your feet, ankles, stomach or hands; it is harder for you to breath when lying down and you need to sit up; Chest discomfort, heaviness or pain; you feel more tired or have less energy than normal, new or worsening dizziness, dry hacky cough, you feel uneasy and you know something is not right Continue to weight and record weights daily Attend follow up appointments as scheduled Contact your provider for Health questions or concerns as needed  Our next appointment is by telephone on 10/21/21 at 2:00 pm  Please call the care guide team at [phone] if you need to cancel or reschedule your appointment.   If you are experiencing a Mental Health or [place] or need someone to talk to, please call the Suicide and Crisis Lifeline: 988 call the [place] National Suicide Prevention Lifeline: 306-385-7796 or TTY: 3026485613 TTY 3050665391) to talk to a trained counselor   Patient verbalizes understanding of instructions and care plan provided today and agrees to view in [place]. Active MyChart status confirmed with patient.    Thea Silversmith, RN, MSN, BSN, CCM Care Management Coordinator [facility] [phone]   How to Take Your Blood Pressure Blood pressure measures how strongly your blood is pressing against the  walls of your arteries. Arteries are blood vessels that carry blood from your heart throughout your body. You can take your blood pressure at home with a machine. You may need to check your blood pressure at home: To check if you have high blood pressure (hypertension). To check your blood pressure over time. To make sure your blood pressure medicine is working. Supplies needed: Blood pressure machine, or monitor. Dining room chair to sit in. Table or desk. Small notebook. Pencil or pen. How to prepare Avoid these things for 30 minutes before checking your blood pressure: Having drinks with caffeine in them, such as coffee or tea. Drinking alcohol. Eating. Smoking. Exercising. Do these things five minutes before checking your blood pressure: Go to the bathroom and pee (urinate). Sit in a dining chair. Do not sit on a soft couch or an armchair. Be quiet. Do not talk. How to take your blood pressure Follow the instructions that came with your machine. If you have a digital blood pressure monitor, these may be the instructions: Sit up straight. Place your feet on the floor. Do not cross your ankles or legs. Rest your left arm at the level of your heart. You may rest it on a table, desk, or chair. Pull up your shirt sleeve. Wrap the blood pressure cuff around the upper part of your left arm. The cuff should be 1 inch (2.5 cm) above your elbow. It is best to wrap the cuff around bare skin. Fit the cuff snugly around your arm. You should be able to place only one finger between the  cuff and your arm. Place the cord so that it rests in the bend of your elbow. Press the power button. Sit quietly while the cuff fills with air and loses air. Write down the numbers on the screen. Wait 2-3 minutes and then repeat steps 1-10. What do the numbers mean? Two numbers make up your blood pressure. The first number is called systolic pressure. The second is called diastolic pressure. An example of a  blood pressure reading is "120 over 80" (or 120/80). If you are an adult and do not have a medical condition, use this guide to find out if your blood pressure is normal: Normal First number: below 120. Second number: below 80. Elevated First number: 120-129. Second number: below 80. Hypertension stage 1 First number: 130-139. Second number: 80-89. Hypertension stage 2 First number: 140 or above. Second number: 56 or above. Your blood pressure is above normal even if only the first or only the second number is above normal. Follow these instructions at home: Medicines Take over-the-counter and prescription medicines only as told by your doctor. Tell your doctor if your medicine is causing side effects. General instructions Check your blood pressure as often as your doctor tells you to. Check your blood pressure at the same time every day. Take your monitor to your next doctor's appointment. Your doctor will: Make sure you are using it correctly. Make sure it is working right. Understand what your blood pressure numbers should be. Keep all follow-up visits as told by your doctor. This is important. General tips You will need a blood pressure machine, or monitor. Your doctor can suggest a monitor. You can buy one at a drugstore or online. When choosing one: Choose one with an arm cuff. Choose one that wraps around your upper arm. Only one finger should fit between your arm and the cuff. Do not choose one that measures your blood pressure from your wrist or finger. Where to find more information American Heart Association: www.heart.org Contact a doctor if: Your blood pressure keeps being high. Your blood pressure is suddenly low. Get help right away if: Your first blood pressure number is higher than 180. Your second blood pressure number is higher than 120. Summary Check your blood pressure at the same time every day. Avoid caffeine, alcohol, smoking, and exercise for 30  minutes before checking your blood pressure. Make sure you understand what your blood pressure numbers should be. This information is not intended to replace advice given to you by your health care provider. Make sure you discuss any questions you have with your health care provider. Document Revised: 06/26/2020 Document Reviewed: 08/11/2019 Elsevier Patient Education  2022 [place].  Hypertension, Adult Hypertension is another name for high blood pressure. High blood pressure forces your heart to work harder to pump blood. This can cause problems over time. There are two numbers in a blood pressure reading. There is a top number (systolic) over a bottom number (diastolic). It is best to have a blood pressure that is below 120/80. Healthy choices can help lower your blood pressure, or you may need medicine to help lower it. What are the causes? The cause of this condition is not known. Some conditions may be related to high blood pressure. What increases the risk? Smoking. Having type 2 diabetes mellitus, high cholesterol, or both. Not getting enough exercise or physical activity. Being overweight. Having too much fat, sugar, calories, or salt (sodium) in your diet. Drinking too much alcohol. Having long-term (chronic) kidney  disease. Having a family history of high blood pressure. Age. Risk increases with age. Race. You may be at higher risk if you are African American. Gender. Men are at higher risk than women before age 72. After age 36, women are at higher risk than men. Having obstructive sleep apnea. Stress. What are the signs or symptoms? High blood pressure may not cause symptoms. Very high blood pressure (hypertensive crisis) may cause: Headache. Feelings of worry or nervousness (anxiety). Shortness of breath. Nosebleed. A feeling of being sick to your stomach (nausea). Throwing up (vomiting). Changes in how you see. Very bad chest pain. Seizures. How is this  treated? This condition is treated by making healthy lifestyle changes, such as: Eating healthy foods. Exercising more. Drinking less alcohol. Your health care provider may prescribe medicine if lifestyle changes are not enough to get your blood pressure under control, and if: Your top number is above 130. Your bottom number is above 80. Your personal target blood pressure may vary. Follow these instructions at home: Eating and drinking  If told, follow the DASH eating plan. To follow this plan: Fill one half of your plate at each meal with fruits and vegetables. Fill one fourth of your plate at each meal with whole grains. Whole grains include whole-wheat pasta, brown rice, and whole-grain bread. Eat or drink low-fat dairy products, such as skim milk or low-fat yogurt. Fill one fourth of your plate at each meal with low-fat (lean) proteins. Low-fat proteins include fish, chicken without skin, eggs, beans, and tofu. Avoid fatty meat, cured and processed meat, or chicken with skin. Avoid pre-made or processed food. Eat less than 1,500 mg of salt each day. Do not drink alcohol if: Your doctor tells you not to drink. You are pregnant, may be pregnant, or are planning to become pregnant. If you drink alcohol: Limit how much you use to: 0-1 drink a day for women. 0-2 drinks a day for men. Be aware of how much alcohol is in your drink. In the [place] one drink equals one 12 oz bottle of beer (355 mL), one 5 oz glass of wine (148 mL), or one 1 oz glass of hard liquor (44 mL). Lifestyle  Work with your doctor to stay at a healthy weight or to lose weight. Ask your doctor what the best weight is for you. Get at least 30 minutes of exercise most days of the week. This may include walking, swimming, or biking. Get at least 30 minutes of exercise that strengthens your muscles (resistance exercise) at least 3 days a week. This may include lifting weights or doing Pilates. Do not use any products  that contain nicotine or tobacco, such as cigarettes, e-cigarettes, and chewing tobacco. If you need help quitting, ask your doctor. Check your blood pressure at home as told by your doctor. Keep all follow-up visits as told by your doctor. This is important. Medicines Take over-the-counter and prescription medicines only as told by your doctor. Follow directions carefully. Do not skip doses of blood pressure medicine. The medicine does not work as well if you skip doses. Skipping doses also puts you at risk for problems. Ask your doctor about side effects or reactions to medicines that you should watch for. Contact a doctor if you: Think you are having a reaction to the medicine you are taking. Have headaches that keep coming back (recurring). Feel dizzy. Have swelling in your ankles. Have trouble with your vision. Get help right away if you: Get a very  bad headache. Start to feel mixed up (confused). Feel weak or numb. Feel faint. Have very bad pain in your: Chest. Belly (abdomen). Throw up more than once. Have trouble breathing. Summary Hypertension is another name for high blood pressure. High blood pressure forces your heart to work harder to pump blood. For most people, a normal blood pressure is less than 120/80. Making healthy choices can help lower blood pressure. If your blood pressure does not get lower with healthy choices, you may need to take medicine. This information is not intended to replace advice given to you by your health care provider. Make sure you discuss any questions you have with your health care provider. Document Revised: 04/27/2018 Document Reviewed: 04/27/2018 Elsevier Patient Education  [place].

## 2021-09-22 NOTE — Chronic Care Management (AMB) (Signed)
Chronic Care Management   CCM RN Visit Note  09/22/2021 Name: Collin Young MRN: [id number] DOB: [birth year]  Subjective: Collin Young is a 71 y.o. year old male who is a primary care patient of Copland, Gay Filler, MD. The care management team was consulted for assistance with disease management and care coordination needs.    Engaged with patient by telephone for follow up visit in response to provider referral for case management and/or care coordination services.   Consent to Services:  The patient was given information about Chronic Care Management services, agreed to services, and gave verbal consent prior to initiation of services.  Please see initial visit note for detailed documentation.   Patient agreed to services and verbal consent obtained.   Assessment: Review of patient past medical history, allergies, medications, health status, including review of consultants reports, laboratory and other test data, was performed as part of comprehensive evaluation and provision of chronic care management services.   SDOH (Social Determinants of Health) assessments and interventions performed:    CCM Care Plan  No Known Allergies  Outpatient Encounter Medications as of 09/22/2021  Medication Sig   albuterol (VENTOLIN HFA) 108 (90 Base) MCG/ACT inhaler INHALE 2 PUFFS INTO THE LUNGS EVERY 6 HOURS AS NEEDED FOR WHEEZING OR SHORTNESS OF BREATH   apixaban (ELIQUIS) 5 MG TABS tablet Take 1 tablet (5 mg total) by mouth 2 (two) times daily.   BAYER LOW DOSE 81 MG EC tablet Take 81 mg by mouth daily. Swallow whole.   dapagliflozin propanediol (FARXIGA) 10 MG TABS tablet Take 1 tablet (10 mg total) by mouth daily.   Digoxin 62.5 MCG TABS Take 0.0625 mg by mouth daily.   Evolocumab (REPATHA SURECLICK) 973 MG/ML SOAJ Inject 140 mg into the skin every 14 (fourteen) days.   fentaNYL (DURAGESIC) 12 MCG/HR Place 1 patch onto the skin every 3 (three) days.   furosemide (LASIX) 40 MG tablet Take 1  tablet (40 mg total) by mouth daily.   hydrALAZINE (APRESOLINE) 25 MG tablet Take 1.5 tablets (37.5 mg total) by mouth every 8 (eight) hours.   ipratropium (ATROVENT) 0.03 % nasal spray Place 2 sprays into both nostrils every 12 (twelve) hours.   isosorbide mononitrate (IMDUR) 30 MG 24 hr tablet Take 1 tablet (30 mg total) by mouth daily.   ivabradine (CORLANOR) 5 MG TABS tablet Take 0.5 tablets (2.5 mg total) by mouth 2 (two) times daily with a meal.   Multiple Vitamin (MULTIVITAMIN) tablet Take 1 tablet by mouth 3 (three) times a week.   polyethylene glycol (MIRALAX / GLYCOLAX) 17 g packet Take 17 g by mouth daily as needed for mild constipation.   pravastatin (PRAVACHOL) 80 MG tablet Take 1 tablet (80 mg total) by mouth every evening.   sacubitril-valsartan (ENTRESTO) 49-51 MG Take 1 tablet by mouth 2 (two) times daily.   spironolactone (ALDACTONE) 25 MG tablet Take 1 tablet (25 mg total) by mouth daily.   Facility-Administered Encounter Medications as of 09/22/2021  Medication   topical emolient (BIAFINE) emulsion    Patient Active Problem List   Diagnosis Date Noted   Acute on chronic systolic CHF (congestive heart failure) ([place]) 07/07/2021   COPD (chronic obstructive pulmonary disease) ([facility])    Hyponatremia    SOB (shortness of breath)    DNR (do not resuscitate)    Weakness generalized    Elevated LFTs    Pulmonary embolism ([place]) 06/11/2021   Acute on chronic heart failure ([place]) 06/02/2021   GERD  without esophagitis 05/08/2021   Mixed hyperlipidemia 05/08/2021   Hypertensive urgency 05/08/2021   Elevated troponin level not due myocardial infarction 05/08/2021   Pure hypercholesterolemia 09/23/2020   Claudication in peripheral vascular disease ([place]) 01/25/2020   Late latent syphilis 01/15/2020   Coronary artery disease involving native coronary artery of native heart 07/05/2019   Chronic kidney disease, stage 3a ([place]) 07/05/2019   Combined systolic and diastolic heart failure  ([place]) 09/26/2018   Malignant neoplasm of prostate ([place]) 06/17/2018   Medication management    Dysphagia    Palliative care by specialist    Ischemic cardiomyopathy    Congestive dilated cardiomyopathy ([place])    Acute on chronic systolic congestive heart failure ([place]) 01/02/2017   Malignant neoplasm of pyriform sinus ([place]) 09/28/2013   Piriform sinus tumor 09/24/2013   NONSPECIFIC ABN FINDING RAD & OTH EXAM GI TRACT 05/14/2009    Conditions to be addressed/monitored:CHF, HTN, and COPD  Care Plan : RN Care Manager Plan of Care  Updates made by [name], RN since 09/22/2021 12:00 AM     Problem: No Plan of Care established for [name], CHF)   Priority: High     Long-Range Goal: Development of Plan of Care for Chronic Disease Managment   Start Date: 07/17/2021  Expected End Date: 10/17/2021  Priority: High  Note:   Current Barriers: Collin Young wears hearing aid to both ears, speaks in whisper. admitted 07/06/21-07/04/21 with acute on chronic congestive heart failure, 06/11/21-06/24/21 with respiratory failure/COPD exacerbation/acute on chronic heart failure/acute Pulmonary embolus; admitted 06/02/21-06/03/21 with congestive heart failure; 05/07/21-05/11/21 with congestive heart failure. Patient lives alone and is independent. Daughter assist with medication management.  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 RNCM spoke with Collin Young. He reports he is doing well. He continues to weigh self daily. Denies signs/symptoms of HF exacerbations. Reports he had some shortness of breath for a couple of days, but states has resolved and denies any issues at this time. Mrs. Rougeau reports recent blood pressure of 170/60. Blood pressure taken during telephone assessment was 136/89. He is using a wrist monitor. RNCM called and spoke with daughter, Collin Young), who reports Collin Young has  all his medications and is taking as prescribed with recent refills on prescriptions. She is unable to review medications with RNCM at this time. Patient with question regarding a "sexual problem" and states he or his daughter will call provider to schedule an appointment. Mr. Kneisel with no other concerns at this time. Knowledge Deficits related to plan of care for management of CHF and COPD  Chronic Disease Management support and education needs related to CHF and COPD  RNCM Clinical Goal(s):  Patient will verbalize basic understanding of CHF and COPD disease process and self health management plan as evidenced by self report. Attending provider visits, taking medications as prescribed. take all medications exactly as prescribed and will call provider for medication related questions as evidenced by self report and chart notations    attend all scheduled medical appointments: as scheduled/recommended as evidenced by self report, chart notation        through collaboration with RN Care manager, provider, and care team.   Interventions: 1:1 collaboration with primary care provider regarding development and update of comprehensive plan of care as evidenced by provider attestation and co-signature Inter-disciplinary care team collaboration (see longitudinal plan of care) Evaluation of current treatment plan related to  self management and patient's adherence  to plan as established by provider  COPD Interventions: Long Term: Goal on track: Yes  Reviewed upcoming appointments Encouraged to continue to take medications as recommended Encouraged to use rescue inhaler as needed  Heart Failure Interventions: Long Term: Goal on track: yes Discussed importance of daily weight and advised patient to weigh and record daily Reviewed signs/symptoms of congestive heart failure and when to call the doctor   Encouraged to continue to eat Healthy and minimize salt  Hypertension Interventions:  (Status:  New  goal.) Long Term Goal Last practice recorded BP readings:  BP Readings from Last 3 Encounters:  08/29/21 (!) 100/58  08/15/21 100/60  08/01/21 (!) 71/50  Most recent eGFR/CrCl:  Lab Results  Component Value Date   EGFR 83 (L) 03/10/2017    No components found for: CRCL  Evaluation of current treatment plan related to hypertension self management and patient's adherence to plan as established by provider Provided education to patient re: stroke prevention, s/s of heart attack and stroke Provided instructions for getting most accurate reading with a wrist blood pressure monitor Encouraged patient to check and record reading and take to provider office  Encouraged to take medications as prescription  Patient Goals/Self-Care Activities: Check blood pressure and record. Take with you to your provider visits. Ask your Doctor what is your Target blood pressure range Take medications as prescribed   follow rescue plan if symptoms flare-up eat more whole grains, fruits and vegetables, lean meats and healthy fats know when to call the doctor call for weight gain 3 pounds in a day or 5 pounds in one week; more swelling of your feet, ankles, stomach or hands; it is harder for you to breath when lying down and you need to sit up; Chest discomfort, heaviness or pain; you feel more tired or have less energy than normal, new or worsening dizziness, dry hacky cough, you feel uneasy and you know something is not right Continue to weight and record weights daily Attend follow up appointments as scheduled Contact your provider for Health questions or concerns as needed   Plan:Telephone follow up appointment with care management team member scheduled for:  10/21/21 The patient has been provided with contact information for the care management team and has been advised to call with any health related questions or concerns.   Thea Silversmith, RN, MSN, BSN, CCM Care Management Coordinator [facility] [phone]

## 2021-09-30 DIAGNOSIS — I5040 Unspecified combined systolic (congestive) and diastolic (congestive) heart failure: Secondary | ICD-10-CM | POA: Diagnosis not present

## 2021-09-30 DIAGNOSIS — J449 Chronic obstructive pulmonary disease, unspecified: Secondary | ICD-10-CM

## 2021-10-02 ENCOUNTER — Ambulatory Visit (HOSPITAL_COMMUNITY)
Admission: RE | Admit: 2021-10-02 | Discharge: 2021-10-02 | Disposition: A | Discharge status: Home / Self Care | Payer: Medicare Other | Source: Ambulatory Visit | Attending: Internal Medicine | Admitting: Internal Medicine

## 2021-10-02 ENCOUNTER — Other Ambulatory Visit: Payer: Self-pay

## 2021-10-02 ENCOUNTER — Ambulatory Visit (HOSPITAL_BASED_OUTPATIENT_CLINIC_OR_DEPARTMENT_OTHER)
Admission: RE | Admit: 2021-10-02 | Discharge: 2021-10-02 | Disposition: A | Discharge status: Home / Self Care | Payer: Medicare Other | Source: Ambulatory Visit | Attending: Family Medicine | Admitting: Family Medicine

## 2021-10-02 ENCOUNTER — Encounter (HOSPITAL_COMMUNITY): Payer: Self-pay | Admitting: Internal Medicine

## 2021-10-02 VITALS — BP 139/74 | HR 85 | Wt 132.0 lb

## 2021-10-02 DIAGNOSIS — I2510 Atherosclerotic heart disease of native coronary artery without angina pectoris: Secondary | ICD-10-CM

## 2021-10-02 DIAGNOSIS — Z85819 Personal history of malignant neoplasm of unspecified site of lip, oral cavity, and pharynx: Secondary | ICD-10-CM | POA: Insufficient documentation

## 2021-10-02 DIAGNOSIS — E785 Hyperlipidemia, unspecified: Secondary | ICD-10-CM | POA: Insufficient documentation

## 2021-10-02 DIAGNOSIS — Z7901 Long term (current) use of anticoagulants: Secondary | ICD-10-CM | POA: Diagnosis not present

## 2021-10-02 DIAGNOSIS — I5040 Unspecified combined systolic (congestive) and diastolic (congestive) heart failure: Secondary | ICD-10-CM

## 2021-10-02 DIAGNOSIS — I130 Hypertensive heart and chronic kidney disease with heart failure and stage 1 through stage 4 chronic kidney disease, or unspecified chronic kidney disease: Secondary | ICD-10-CM | POA: Diagnosis not present

## 2021-10-02 DIAGNOSIS — I080 Rheumatic disorders of both mitral and aortic valves: Secondary | ICD-10-CM | POA: Diagnosis not present

## 2021-10-02 DIAGNOSIS — I2699 Other pulmonary embolism without acute cor pulmonale: Secondary | ICD-10-CM | POA: Insufficient documentation

## 2021-10-02 DIAGNOSIS — J449 Chronic obstructive pulmonary disease, unspecified: Secondary | ICD-10-CM | POA: Diagnosis not present

## 2021-10-02 DIAGNOSIS — I255 Ischemic cardiomyopathy: Secondary | ICD-10-CM | POA: Insufficient documentation

## 2021-10-02 DIAGNOSIS — Z8501 Personal history of malignant neoplasm of esophagus: Secondary | ICD-10-CM | POA: Diagnosis not present

## 2021-10-02 DIAGNOSIS — Z9221 Personal history of antineoplastic chemotherapy: Secondary | ICD-10-CM | POA: Diagnosis not present

## 2021-10-02 DIAGNOSIS — Z7984 Long term (current) use of oral hypoglycemic drugs: Secondary | ICD-10-CM | POA: Insufficient documentation

## 2021-10-02 DIAGNOSIS — N1831 Chronic kidney disease, stage 3a: Secondary | ICD-10-CM | POA: Diagnosis not present

## 2021-10-02 DIAGNOSIS — I5042 Chronic combined systolic (congestive) and diastolic (congestive) heart failure: Secondary | ICD-10-CM | POA: Diagnosis not present

## 2021-10-02 DIAGNOSIS — N183 Chronic kidney disease, stage 3 unspecified: Secondary | ICD-10-CM | POA: Diagnosis not present

## 2021-10-02 DIAGNOSIS — Z8546 Personal history of malignant neoplasm of prostate: Secondary | ICD-10-CM | POA: Diagnosis not present

## 2021-10-02 DIAGNOSIS — Z79899 Other long term (current) drug therapy: Secondary | ICD-10-CM | POA: Insufficient documentation

## 2021-10-02 LAB — ECHOCARDIOGRAM COMPLETE
AR max vel: 2.92 cm2
AV Area VTI: 2.92 cm2
AV Area mean vel: 2.82 cm2
AV Mean grad: 4 mmHg
AV Peak grad: 7 mmHg
Ao pk vel: 1.32 m/s
Area-P 1/2: 9.37 cm2
MV M vel: 3.51 m/s
MV Peak grad: 49.3 mmHg
P 1/2 time: 506 msec
S' Lateral: 4.9 cm

## 2021-10-02 MED ORDER — SILDENAFIL CITRATE 50 MG PO TABS: 50.0000 mg | ORAL_TABLET | Freq: Every day | ORAL | 0 refills | Status: DC | PRN

## 2021-10-02 MED ORDER — SPIRONOLACTONE 25 MG PO TABS: 25.0000 mg | ORAL_TABLET | Freq: Every day | ORAL | 6 refills | Status: DC

## 2021-10-02 NOTE — Patient Instructions (Signed)
Medication Changes:  Stop Imdur  Restart Spironolactone 25 mg Daily  Sildenafil 50mg  as needed  Lab Work:  In 2 weeks  Testing/Procedures:  none  Referrals:  none  Special Instructions // Education:  none  Follow-Up in: 3 months  At the [facility], you and your health needs are our priority. We have a designated team specialized in the treatment of Heart Failure. This Care Team includes your primary Heart Failure Specialized Cardiologist (physician), Advanced Practice Providers (APPs- Physician Assistants and Nurse Practitioners), and Pharmacist who all work together to provide you with the care you need, when you need it.   You may see any of the following providers on your designated Care Team at your next follow up:  Dr Glori Bickers Dr Haynes Kerns, NP Lyda Jester, [place] [facility] [place], [place] Audry Riles, PharmD   Please be sure to bring in all your medications bottles to every appointment.   Need to Contact us:  If you have any questions or concerns before your next appointment please send [place] a message through [place] or call our office at [phone].    TO LEAVE A MESSAGE FOR THE NURSE SELECT OPTION 2, PLEASE LEAVE A MESSAGE INCLUDING: YOUR NAME DATE OF BIRTH CALL BACK NUMBER REASON FOR CALL**this is important as we prioritize the call backs  YOU WILL RECEIVE A CALL BACK THE SAME DAY AS LONG AS YOU CALL BEFORE 4:00 PM

## 2021-10-02 NOTE — Progress Notes (Addendum)
ADVANCED HF CLINIC NOTE PCP: Copland, Gay Filler, MD HF Cardiologist: Dr. Haroldine Laws  HPI: Collin Young is a 71 y.o.male w/ chronic combined systolic and diastolic CHF/ ischemic CM, severe multivessel CAD treated medically, 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     HF dates back to 12/2016. Echo then w/ severely reduced LVEF 10-15%, RV mild-mod reduced, mod AI/MR/TR. LHC showed severe calcific disease in the proximal to mid RCA and chronic total occlusion of the proximal circumflex.There was consideration of RCA PCI with recommendations for medical management.    Admitted  9/22 with HF. Echo EF down 25-30%, RV not well visualized. Mod MR/AI. Chest CT showed  large right pleural effusion.  There was questionable PE on his CT but ultimately this was thought to be just artifact with poor opacification. Given w/ IV Lasix and discharged home on Entresto, Farxiga, Toprol XL and PRN lasix.    Admitted 06/11/21 with HF and acute hypoxic respiratory failure.Complicated by PE, pleural effusion, and cardiogenic shock. Diuresed with IV lasix.  Limited Echo EF 20%, global HK, RV normal.  RHC cardiogenic shock with R>L heart failure. GDMT titrated. Not felt to be a VAD candidate due to cachexia and RV failure. Discharged home, weight 121 lbs.    Admitted 07/07/21 with HF. Echo EF 25-30%, severe LV dysfunction, grade I DD, RV mildly reduced.  Diuresed with IV lasix and transitioned to po lasix 40 mg daily. Continued on GDMT, discharge weight 122 lbs.    Last visit ivabradine 2.5 mg twice a day started. Overall feeling fine. Denies SOB/PND/Orthopnea.  No bleeding issues. Appetite ok. No fever or chills. Weight at home has been stable. Taking all medications but he does not have spironolactone.   Echo today -EF 40-45% reviewed by Dr Aundra Dubin and Dr Haroldine Laws    Cardiac Studies Echo 2018 EF 15%.  Echo 05/2017 EF improved up to  45-50%, Mod AI. RV normal.   Echo 05/2020 EF down again, 35-40%. RV normal  Echo 10/22 EF 20%, global HK, RV mildly reduced Echo 11/22 EF 25-30%, global HK, RV mildly reduced.  CMRI - 12/22, unable to quanify EF, LGE with prior infarct basal to mid inferior/inferolateral walls. LGE is greater than 50% transmural suggesting territory is not viable  Past Medical History:  Diagnosis Date   Chronic combined systolic and diastolic CHF (congestive heart failure) ([place])    followed by [name]   COPD (chronic obstructive pulmonary disease) ([place])    Coronary artery disease cardiologist-- [name]   per cardiac cath 01-05-2017  chronic total occlusion pLCx with collaterals, 99% severe calcified prox. to mid RCA, otherwise mild to moderate CAD (medically managed)   Esophageal cancer, stage IIIB [facility]) oncologist-  dr ennever/  dr moody   dx 2010  SCC Stage IIIB completed chemoradiation;  localized recurrent left piriform sinus 01/ 2015,  completed concurrent chemoradiatoin 04/ 2015   GERD (gastroesophageal reflux disease)    09-07-2018   no issues since Gtube removed 06/ 2019   History of alcohol abuse    quit 2001   History of cancer chemotherapy    2010;   2015   History of radiation therapy    10-19-2013 to  12-05-2013 pyriform sinus 69.96 Gy/28fx;   Radiation completed 2010 for esophageal cancer   History of seizure 2001   alcohol withdrawal   HOH (hard of hearing)    Hyperlipidemia  Hypertension    Ischemic cardiomyopathy 12/2016   01-03-2017  echo,  ef 10-15%/   echo 05-06-2017 EF improved to 45-50%   Prostate cancer [place]) urologist-  dr ottelin/  oncologist-- [name]   dx 05-27-2018--- Stage T1c,  Gleason 4+3,  PSA 9.3--  scheduled for brachytherapy 09-23-2017   Renal cyst, left    Current Outpatient Medications  Medication Sig Dispense Refill   albuterol (VENTOLIN HFA) 108 (90 Base) MCG/ACT inhaler INHALE 2 PUFFS INTO THE LUNGS EVERY 6 HOURS AS NEEDED FOR  WHEEZING OR SHORTNESS OF BREATH 20.1 g 1   apixaban (ELIQUIS) 5 MG TABS tablet Take 1 tablet (5 mg total) by mouth 2 (two) times daily. 60 tablet 6   BAYER LOW DOSE 81 MG EC tablet Take 81 mg by mouth daily. Swallow whole.     dapagliflozin propanediol (FARXIGA) 10 MG TABS tablet Take 1 tablet (10 mg total) by mouth daily. 30 tablet 2   Digoxin 62.5 MCG TABS Take 0.0625 mg by mouth daily. 30 tablet 6   Evolocumab (REPATHA SURECLICK) 366 MG/ML SOAJ Inject 140 mg into the skin every 14 (fourteen) days. 2 mL 11   fentaNYL (DURAGESIC) 12 MCG/HR Place 1 patch onto the skin every 3 (three) days. 10 patch 0   furosemide (LASIX) 40 MG tablet Take 1 tablet (40 mg total) by mouth daily. 30 tablet 3   hydrALAZINE (APRESOLINE) 25 MG tablet Take 1.5 tablets (37.5 mg total) by mouth every 8 (eight) hours. 135 tablet 3   ipratropium (ATROVENT) 0.03 % nasal spray Place 2 sprays into both nostrils every 12 (twelve) hours. 30 mL 12   isosorbide mononitrate (IMDUR) 30 MG 24 hr tablet Take 1 tablet (30 mg total) by mouth daily. 30 tablet 3   ivabradine (CORLANOR) 5 MG TABS tablet Take 0.5 tablets (2.5 mg total) by mouth 2 (two) times daily with a meal. 30 tablet 3   Multiple Vitamin (MULTIVITAMIN) tablet Take 1 tablet by mouth 3 (three) times a week.     polyethylene glycol (MIRALAX / GLYCOLAX) 17 g packet Take 17 g by mouth daily as needed for mild constipation. 14 each 0   pravastatin (PRAVACHOL) 80 MG tablet Take 1 tablet (80 mg total) by mouth every evening. 90 tablet 3   sacubitril-valsartan (ENTRESTO) 49-51 MG Take 1 tablet by mouth 2 (two) times daily. 60 tablet 11   spironolactone (ALDACTONE) 25 MG tablet Take 1 tablet (25 mg total) by mouth daily. (Patient not taking: Reported on 10/02/2021) 30 tablet 6   No current facility-administered medications for this encounter.   Facility-Administered Medications Ordered in Other Encounters  Medication Dose Route Frequency Provider Last Rate Last Admin   topical  emolient (BIAFINE) emulsion   Topical Daily Kyung Rudd, MD   Given at 01/19/14 0912   No Known Allergies  Social History   Socioeconomic History   Marital status: Legally Separated    Spouse name: Not on file   Number of children: 2   Years of education: Not on file   Highest education level: Not on file  Occupational History   Occupation: retired  Tobacco Use   Smoking status: Former    Packs/day: 1.00    Years: 40.00    Pack years: 40.00    Types: Cigarettes    Start date: 11/08/1960    Quit date: 08/31/2009    Years since quitting: 12.0   Smokeless tobacco: Former    Types: Chew    Quit date: 09/01/1999  Vaping Use   Vaping Use: Never used  Substance and Sexual Activity   Alcohol use: Not Currently    Alcohol/week: 0.0 standard drinks    Comment: quit in 2001   Drug use: No    Comment: back in the day used cocaine,alcohol, marijuana   Sexual activity: Yes    Partners: Female    Birth control/protection: None  Other Topics Concern   Not on file  Social History Narrative   Not on file   Social Determinants of Health   Financial Resource Strain: Low Risk    Difficulty of Paying Living Expenses: Not hard at all  Food Insecurity: No Food Insecurity   Worried About Charity fundraiser in the Last Year: Never true   [place] in the Last Year: Never true  Transportation Needs: No Transportation Needs   Lack of Transportation (Medical): No   Lack of Transportation (Non-Medical): No  Physical Activity: Insufficiently Active   Days of Exercise per Week: 7 days   Minutes of Exercise per Session: 20 min  Stress: No Stress Concern Present   Feeling of Stress : Not at all  Social Connections: Moderately Integrated   Frequency of Communication with Friends and Family: More than three times a week   Frequency of Social Gatherings with Friends and Family: More than three times a week   Attends Religious Services: More than 4 times per year   Active Member of [employer] or  Organizations: Yes   Attends Music therapist: More than 4 times per year   Marital Status: Separated  Intimate Partner Violence: Not At Risk   Fear of Current or Ex-Partner: No   Emotionally Abused: No   Physically Abused: No   Sexually Abused: No   Family History  Problem Relation Age of Onset   Breast cancer Mother    Prostate cancer Neg Hx    Colon cancer Neg Hx    Pancreatic cancer Neg Hx    BP (!) 88/50    Pulse 85    Wt 59.9 kg (132 lb)    SpO2 97%    BMI 21.31 kg/m   Wt Readings from Last 3 Encounters:  10/02/21 59.9 kg (132 lb)  08/29/21 60.1 kg (132 lb 9.6 oz)  08/15/21 57.2 kg (126 lb)   PHYSICAL EXAM: General:  Hard of hearing. No resp difficulty HEENT: normal Neck: supple. no JVD. Carotids 2+ bilat; no bruits. No lymphadenopathy or thryomegaly appreciated. Cor: PMI nondisplaced. Regular rate & rhythm. No rubs, gallops or murmurs. Lungs: clear Abdomen: soft, nontender, nondistended. No hepatosplenomegaly. No bruits or masses. Good bowel sounds. Extremities: no cyanosis, clubbing, rash, edema Neuro: alert & orientedx3, cranial nerves grossly intact. moves all 4 extremities w/o difficulty. Affect pleasant  ASSESSMENT & PLAN:  1.  Chronic Systolic Heart Failure  - Echo 2018 EF 10-15%, RV moderately reduced. LHC w/ MVCAD management medically - Echo 05/2020 EF down again, 35-40%. RV normal  - Echo 05/2021 EF down 25-30%, RV not well visualized. Mod MR and Mod AI. - Limited Echo 10/22: EF 20%, RV normal  - RHC 06/16/21 w/ R>L HF and low output, CI 1.8. PAPi 1.3. - Echo 11/22: EF 25-30%, global HK, RV mildly reduced. - cMRI 12/22, unable to quanify EF, LGE with prior infarct basal to mid inferior/inferolateral walls. LGE is greater than 50% transmural suggesting territory is not viable - ECHO today discussed and reviewed by Dr Haroldine Laws. EF improved 40-45%.  -NYHA II. Volume status  stable. Continue lasix 40 mg daily  - ivabradine 2.5 mg bid.  - Continue  Entresto 49/51 mg bid - Continue hydral 37.5 mg tid. Stop Imdur with addition sildenafil.  - Reorder spiro 25 mg daily. Repeat BMET in 7 -10 days.  - Continue Farxiga 10 mg daily. - Stop digoxin with improving EF.   - Not felt to be VAD candidate due to cachexia and RV failure. C-    2. Pulmonary Embolism  - Diagnosed 10/22 - Limited echo w/ no RV strain  - Suspect incidental due HF. - On Eliquis 5 mg bid, reports full compliance.    3. CKD Stage IIIa   4. H/o Pleural Effusion, right - s/p thoracentesis by 06/11/21, 1200 cc removed, transudative    5. H/o esophageal CA and throat CA - s/p chemo and XRT.   6. H/o Hyponatremia - Likely 2/2 previous hypervolemia  Follow up in 3 months.  Amy Clegg NP-C   10/02/21  Patient seen and examined with the above-signed Advanced Practice Provider and/or Housestaff. I personally reviewed laboratory data, imaging studies and relevant notes. I independently examined the patient and formulated the important aspects of the plan. I have edited the note to reflect any of my changes or salient points. I have personally discussed the plan with the patient and/or family.  Overall much improved. NYHA I-II. Volume status looks good. Echo today EF 40-45%. Wants to use Viagra  General:  Thin. Hoarse voice No resp difficulty HEENT: normal Neck: supple. no JVD. Carotids 2+ bilat; no bruits. No lymphadenopathy or thryomegaly appreciated. Cor: PMI nondisplaced. Regular rate & rhythm. No rubs, gallops or murmurs. Lungs: clear Abdomen: soft, nontender, nondistended. No hepatosplenomegaly. No bruits or masses. Good bowel sounds. Extremities: no cyanosis, clubbing, rash, edema Neuro: alert & orientedx3, cranial nerves grossly intact. moves all 4 extremities w/o difficulty. Affect pleasant  He is much improved NYHA I-II. Echo today with EF 40-45% Volume status looks great.   Will restart spiro. Can stop digoxin. He wants to start using PDE-5i again so will  stop imdur. Continue Eliquis.   Check labs.   D/w his daughter by phone.  Total time spent 35 minutes. Over half that time spent discussing above.    Glori Bickers, MD  12:53 PM

## 2021-10-06 ENCOUNTER — Other Ambulatory Visit: Payer: Self-pay

## 2021-10-06 ENCOUNTER — Other Ambulatory Visit (HOSPITAL_BASED_OUTPATIENT_CLINIC_OR_DEPARTMENT_OTHER): Payer: Self-pay

## 2021-10-06 DIAGNOSIS — C12 Malignant neoplasm of pyriform sinus: Secondary | ICD-10-CM

## 2021-10-06 DIAGNOSIS — C61 Malignant neoplasm of prostate: Secondary | ICD-10-CM

## 2021-10-06 MED ORDER — FENTANYL 12 MCG/HR TD PT72: 1.0000 | MEDICATED_PATCH | TRANSDERMAL | 0 refills | Status: DC

## 2021-10-06 NOTE — Telephone Encounter (Signed)
PT CALLED REQUESTING REFILL ON FENTANYL PATCHES

## 2021-10-07 ENCOUNTER — Emergency Department (HOSPITAL_COMMUNITY)
Admission: EM | Admit: 2021-10-07 | Discharge: 2021-10-08 | Disposition: A | Discharge status: Home / Self Care | Payer: Medicare Other | Attending: Emergency Medicine | Admitting: Emergency Medicine

## 2021-10-07 ENCOUNTER — Telehealth: Payer: Self-pay

## 2021-10-07 DIAGNOSIS — R109 Unspecified abdominal pain: Secondary | ICD-10-CM | POA: Diagnosis not present

## 2021-10-07 DIAGNOSIS — R079 Chest pain, unspecified: Secondary | ICD-10-CM | POA: Insufficient documentation

## 2021-10-07 DIAGNOSIS — R1084 Generalized abdominal pain: Secondary | ICD-10-CM | POA: Diagnosis not present

## 2021-10-07 DIAGNOSIS — Z5321 Procedure and treatment not carried out due to patient leaving prior to being seen by health care provider: Secondary | ICD-10-CM | POA: Diagnosis not present

## 2021-10-07 DIAGNOSIS — R42 Dizziness and giddiness: Secondary | ICD-10-CM | POA: Diagnosis not present

## 2021-10-07 LAB — COMPREHENSIVE METABOLIC PANEL
ALT: 11 U/L (ref 0–44)
AST: 22 U/L (ref 15–41)
Albumin: 3.7 g/dL (ref 3.5–5.0)
Alkaline Phosphatase: 32 U/L — ABNORMAL LOW (ref 38–126)
Anion gap: 9 (ref 5–15)
BUN: 19 mg/dL (ref 8–23)
CO2: 31 mmol/L (ref 22–32)
Calcium: 9.7 mg/dL (ref 8.9–10.3)
Chloride: 95 mmol/L — ABNORMAL LOW (ref 98–111)
Creatinine, Ser: 1.34 mg/dL — ABNORMAL HIGH (ref 0.61–1.24)
GFR, Estimated: 57 mL/min — ABNORMAL LOW (ref >60)
Glucose, Bld: 131 mg/dL — ABNORMAL HIGH (ref 70–99)
Potassium: 4.4 mmol/L (ref 3.5–5.1)
Sodium: 135 mmol/L (ref 135–145)
Total Bilirubin: 0.3 mg/dL (ref 0.3–1.2)
Total Protein: 6.9 g/dL (ref 6.5–8.1)

## 2021-10-07 LAB — CBC
HCT: 39 % (ref 39.0–52.0)
Hemoglobin: 12.2 g/dL — ABNORMAL LOW (ref 13.0–17.0)
MCH: 27.3 pg (ref 26.0–34.0)
MCHC: 31.3 g/dL (ref 30.0–36.0)
MCV: 87.2 fL (ref 80.0–100.0)
Platelets: 204 10*3/uL (ref 150–400)
RBC: 4.47 MIL/uL (ref 4.22–5.81)
RDW: 16.3 % — ABNORMAL HIGH (ref 11.5–15.5)
WBC: 5.6 10*3/uL (ref 4.0–10.5)
nRBC: 0 % (ref 0.0–0.2)

## 2021-10-07 LAB — TYPE AND SCREEN
ABO/RH(D): A POS
Antibody Screen: NEGATIVE

## 2021-10-07 NOTE — ED Provider Triage Note (Signed)
Emergency Medicine Provider Triage Evaluation Note  Collin Young , a 71 y.o. male  was evaluated in triage.  Pt complains of abdominal pain and black stool.  Symptoms have been present for 1 week.  Patient states that abdominal pain is worse when he belches.  Pain is located throughout his entire abdomen and radiates to inferior aspect of his sternum.  Patient reports that he has had black stools over the last week.  Endorses taking Pepto-Bismol last Wednesday and Thursday.  Patient is on Eliquis.  Patient reports feeling lightheaded earlier today.  Review of Systems  Positive: Abdominal pain, melena, lightheadedness Negative: Fever, chills, nausea, vomiting, constipation, diarrhea, blood in stool, dysuria, hematuria, urinary urgency, syncope  Physical Exam  BP (!) 165/73 (BP Location: Left Arm)    Pulse 91    Temp 98 F (36.7 C) (Oral)    Resp 16    SpO2 97%  Gen:   Awake, no distress   Resp:  Normal effort  MSK:   Moves extremities without difficulty  Other:  Abdomen firm, nondistended, nontender.  No guarding or rebound tenderness.  Medical Decision Making  Medically screening exam initiated at 3:36 PM.  Appropriate orders placed.  Collin Young was informed that the remainder of the evaluation will be completed by another provider, this initial triage assessment does not replace that evaluation, and the importance of remaining in the ED until their evaluation is complete.  Patient was evaluated in wheelchair, unable to have abdomen fully relax during physical exam.  Due to reports of black stool concern for GI bleed.  We will move patient back to next available room.   Collin Young, [place] 10/07/21 1538

## 2021-10-07 NOTE — ED Triage Notes (Signed)
Pt c/o CP/abd pain x1wk. States pain is worse when he belches, but after belching, feels better. Also endorses "jet black" stools for same, lightheaded feeling "for a few seconds, then it goes away." Hx GERD, compliant w all home meds

## 2021-10-07 NOTE — Telephone Encounter (Signed)
Pt is at the ED.

## 2021-10-07 NOTE — Telephone Encounter (Signed)
Nurse Assessment Nurse: Linward Headland, RN, Ana Date/Time (Eastern Time): 10/07/2021 2:09:39 PM Confirm and document reason for call. If symptomatic, describe symptoms. ---Caller states her father has black stools, also complaining of chest pain when trying to belch. She is not currently with the patient, she is reaching out to him via 3 way call now. Patient states he first noticed black stool on Saturday, denies any chest pain right now, pain when trying to belch in stomach. Does the patient have any new or worsening symptoms? ---Yes Will a triage be completed? ---Yes Related visit to physician within the last 2 weeks? ---No Does the PT have any chronic conditions? (i.e. diabetes, asthma, this includes High risk factors for pregnancy, etc.) ---Yes List chronic conditions. ---CHF, he is on blood thinner (eliquis she believes) Is this a behavioral health or substance abuse call? ---No Guidelines Guideline Title Affirmed Question Affirmed Notes Nurse Date/Time Eilene Ghazi Time) Rectal Bleeding Black or tarry bowel movements (Exception: chronicGuardia, RN, Ana 10/07/2021 2:13:25 PM PLEASE NOTE: All timestamps contained within this report are represented as [place] Standard Time. CONFIDENTIALTY NOTICE: This fax transmission is intended only for the addressee. It contains information that is legally privileged, confidential or otherwise protected from use or disclosure. If you are not the intended recipient, you are strictly prohibited from reviewing, disclosing, copying using or disseminating any of this information or taking any action in reliance on or regarding this information. If you have received this fax in error, please notify us immediately by telephone so that we can arrange for its return to [place]. Phone: [phone], Toll-Free: [phone], Fax: [phone] Page: 2 of 2 Call Id: [id number] Guidelines Guideline Title Affirmed Question Affirmed Notes Nurse Date/Time  Eilene Ghazi Time) unchanged black-grey bowel movements AND is taking iron pills or PeptoBismol) Disp. Time Eilene Ghazi Time) Disposition Final User 10/07/2021 2:08:07 PM Send to Urgent Leland Her 10/07/2021 2:18:12 PM Go to ED Now Yes Linward Headland, RN, Norma Fredrickson Disagree/Comply Comply Caller Understands Yes PreDisposition InappropriateToAsk Care Advice Given Per Guideline GO TO ED NOW: * Go to the ED at ___________ [place] given per Rectal Bleeding (Adult) guideline. * You need to be seen in the Emergency Department. Referrals [facility] - ED

## 2021-10-07 NOTE — Progress Notes (Addendum)
[facility] [address], [place], [place] [address] [phone]  Date:  10/09/2021   Name:  Collin Young   DOB:  [birth year]   MRN:  [id number]  PCP:  Darreld Mclean, MD    Chief Complaint: dark stools (Patient c/o Black Stool that began on Saturday. He took Peptobismol last week for gas and stools are still dark.)   History of Present Illness:  Collin Young is a 71 y.o. very pleasant male patient who presents with the following:  Patient seen today with concern of black stools and belching Most recent visit with myself was November 17 for hospital follow-up.  At that time he had been admitted several times recently with CHF exacerbation He has stayed out of the hospital since that time, has been following up regularly with CHF team  About one week ago he noted some difficulty getting a burp to come up, and pain when he would try to burp. He tried taking some pepto-bismol for his sx-this may have contributed to black stools he noted recently  He did present to the ER on 2/7 with abdominal pain and black stools for 1 week- he had some labs done but left before being seen as it was too busy   He is no longer using the pepto He states "I will belch but it's not clear," it seems this means he has to strain some to pass the belch He notes epigastric discomfort  No diarrhea or vomiting He notes a mid abdomen pain off and on when he has to cough   He is eating normally  no bright red blood per rectum.  He notes his stools are back to normal color now He is not taking an anti-acid medication at this time   His gastroenterologist is Dr. Oretha Caprice.  He is followed by the advanced heart failure team, recently seen by Pierre Bali He is taking Eliquis currently for pulmonary embolism since October Patient Active Problem List   Diagnosis Date Noted   Acute on chronic systolic CHF (congestive heart failure) ([place])  07/07/2021   COPD (chronic obstructive pulmonary disease) ([facility])    Hyponatremia    SOB (shortness of breath)    DNR (do not resuscitate)    Weakness generalized    Elevated LFTs    Pulmonary embolism ([place]) 06/11/2021   Acute on chronic heart failure ([place]) 06/02/2021   GERD without esophagitis 05/08/2021   Mixed hyperlipidemia 05/08/2021   Hypertensive urgency 05/08/2021   Elevated troponin level not due myocardial infarction 05/08/2021   Pure hypercholesterolemia 09/23/2020   Claudication in peripheral vascular disease ([place]) 01/25/2020   Late latent syphilis 01/15/2020   Coronary artery disease involving native coronary artery of native heart 07/05/2019   Chronic kidney disease, stage 3a ([place]) 07/05/2019   Combined systolic and diastolic heart failure ([place]) 09/26/2018   Malignant neoplasm of prostate ([place]) 06/17/2018   Medication management    Dysphagia    Palliative care by specialist    Ischemic cardiomyopathy    Congestive dilated cardiomyopathy ([place])    Acute on chronic systolic congestive heart failure ([place]) 01/02/2017   Malignant neoplasm of pyriform sinus ([place]) 09/28/2013   Piriform sinus tumor 09/24/2013   NONSPECIFIC ABN FINDING RAD & OTH EXAM GI TRACT 05/14/2009    Past Medical History:  Diagnosis Date   Chronic combined systolic and diastolic CHF (congestive heart failure) ([place])    followed by  [name]   COPD (chronic obstructive pulmonary disease) [facility])    Coronary artery disease cardiologist-- [name]   per cardiac cath 01-05-2017  chronic total occlusion pLCx with collaterals, 99% severe calcified prox. to mid RCA, otherwise mild to moderate CAD (medically managed)   Esophageal cancer, stage IIIB [facility]) oncologist-  dr ennever/  dr moody   dx 2010  SCC Stage IIIB completed chemoradiation;  localized recurrent left piriform sinus 01/ 2015,  completed concurrent chemoradiatoin 04/ 2015   GERD (gastroesophageal reflux disease)    09-07-2018   no  issues since Gtube removed 06/ 2019   History of alcohol abuse    quit 2001   History of cancer chemotherapy    2010;   2015   History of radiation therapy    10-19-2013 to  12-05-2013 pyriform sinus 69.96 Gy/22fx;   Radiation completed 2010 for esophageal cancer   History of seizure 2001   alcohol withdrawal   HOH (hard of hearing)    Hyperlipidemia    Hypertension    Ischemic cardiomyopathy 12/2016   01-03-2017  echo,  ef 10-15%/   echo 05-06-2017 EF improved to 45-50%   Prostate cancer [facility]) urologist-  dr ottelin/  oncologist-- [name]   dx 05-27-2018--- Stage T1c,  Gleason 4+3,  PSA 9.3--  scheduled for brachytherapy 09-23-2017   Renal cyst, left     Past Surgical History:  Procedure Laterality Date   CARDIOVASCULAR STRESS TEST  10/09/09   normal nuclearr stress test, EF 57% [name])   CENTRAL LINE INSERTION  06/16/2021   Procedure: CENTRAL LINE INSERTION;  Surgeon: Jolaine Artist, MD;  Location: [facility];  Service: Cardiovascular;;   IR GASTROSTOMY TUBE MOD SED  01/13/2017   IR GASTROSTOMY TUBE REMOVAL  02/03/2018   IR PATIENT EVAL TECH 0-60 MINS  03/25/2017   IR REMOVAL TUN ACCESS W/ PORT W/O FL MOD SED  01/15/2017   IR REPLACE G-TUBE SIMPLE WO FLUORO  01/13/2018   LARYNGOSCOPY N/A 09/15/2013   Procedure: LARYNGOSCOPY;  Surgeon: [name], MD;  Location: [facility];  Service: ENT;  Laterality: N/A;  direct laryngoscopy with biopsy and esophagoscopy   RADIOACTIVE SEED IMPLANT N/A 09/23/2018   Procedure: RADIOACTIVE SEED IMPLANT/BRACHYTHERAPY IMPLANT;  Surgeon: [name], MD;  Location: [facility];  Service: Urology;  Laterality: N/A;   RIGHT HEART CATH N/A 06/16/2021   Procedure: RIGHT HEART CATH;  Surgeon: Jolaine Artist, MD;  Location: [facility];  Service: Cardiovascular;  Laterality: N/A;   RIGHT/LEFT HEART CATH AND CORONARY ANGIOGRAPHY N/A 01/05/2017   Procedure: Right/Left Heart Cath and Coronary Angiography;  Surgeon: [name], MD;  Location: [facility];  Service: Cardiovascular;  Laterality: N/A;   TRANSTHORACIC ECHOCARDIOGRAM  05/06/2017   ef 45-50%,  grade 1 diastolic dysfunction/  AV severe calcified non coronary cusp with moderate regurg. , no stenosis (valve area per echo 01-05-2017 1.08cm^2)/  mild MR, TR, and PR/  mild LAE    Social History   Tobacco Use   Smoking status: Former    Packs/day: 1.00    Years: 40.00    Pack years: 40.00    Types: Cigarettes    Start date: 11/08/1960    Quit date: 08/31/2009    Years since quitting: 12.1   Smokeless tobacco: Former    Types: Chew    Quit date: 09/01/1999  Vaping Use   Vaping Use: Never used  Substance Use Topics  Alcohol use: Not Currently    Alcohol/week: 0.0 standard drinks    Comment: quit in 2001   Drug use: No    Comment: back in the day used cocaine,alcohol, marijuana    Family History  Problem Relation Age of Onset   Breast cancer Mother    Prostate cancer Neg Hx    Colon cancer Neg Hx    Pancreatic cancer Neg Hx     No Known Allergies  Medication list has been reviewed and updated.  Current Outpatient Medications on File Prior to Visit  Medication Sig Dispense Refill   albuterol (VENTOLIN HFA) 108 (90 Base) MCG/ACT inhaler INHALE 2 PUFFS INTO THE LUNGS EVERY 6 HOURS AS NEEDED FOR WHEEZING OR SHORTNESS OF BREATH 20.1 g 1   apixaban (ELIQUIS) 5 MG TABS tablet Take 1 tablet (5 mg total) by mouth 2 (two) times daily. 60 tablet 6   BAYER LOW DOSE 81 MG EC tablet Take 81 mg by mouth daily. Swallow whole.     dapagliflozin propanediol (FARXIGA) 10 MG TABS tablet Take 1 tablet (10 mg total) by mouth daily. 30 tablet 2   Digoxin 62.5 MCG TABS Take 0.0625 mg by mouth daily. 30 tablet 6   Evolocumab (REPATHA SURECLICK) 409 MG/ML SOAJ Inject 140 mg into the skin every 14 (fourteen) days. 2 mL 11   fentaNYL (DURAGESIC) 12 MCG/HR Place 1 patch onto the skin every 3 (three) days. 10 patch 0   furosemide (LASIX) 40 MG tablet  Take 1 tablet (40 mg total) by mouth daily. 30 tablet 3   hydrALAZINE (APRESOLINE) 25 MG tablet Take 1.5 tablets (37.5 mg total) by mouth every 8 (eight) hours. 135 tablet 3   ipratropium (ATROVENT) 0.03 % nasal spray Place 2 sprays into both nostrils every 12 (twelve) hours. 30 mL 12   ivabradine (CORLANOR) 5 MG TABS tablet Take 0.5 tablets (2.5 mg total) by mouth 2 (two) times daily with a meal. 30 tablet 3   Multiple Vitamin (MULTIVITAMIN) tablet Take 1 tablet by mouth 3 (three) times a week.     polyethylene glycol (MIRALAX / GLYCOLAX) 17 g packet Take 17 g by mouth daily as needed for mild constipation. 14 each 0   pravastatin (PRAVACHOL) 80 MG tablet Take 1 tablet (80 mg total) by mouth every evening. 90 tablet 3   sacubitril-valsartan (ENTRESTO) 49-51 MG Take 1 tablet by mouth 2 (two) times daily. 60 tablet 11   sildenafil (VIAGRA) 50 MG tablet Take 1 tablet (50 mg total) by mouth daily as needed for erectile dysfunction. 10 tablet 0   spironolactone (ALDACTONE) 25 MG tablet Take 1 tablet (25 mg total) by mouth daily. 30 tablet 6   Current Facility-Administered Medications on File Prior to Visit  Medication Dose Route Frequency Provider Last Rate Last Admin   topical emolient (BIAFINE) emulsion   Topical Daily Kyung Rudd, MD   Given at 01/19/14 8119    Review of Systems:  As per HPI- otherwise negative. Pulse Readings from Last 3 Encounters:  10/09/21 82  10/07/21 95  10/02/21 85   BP Readings from Last 3 Encounters:  10/09/21 122/72  10/07/21 128/72  10/02/21 139/74     Physical Examination: Vitals:   10/09/21 1424  BP: 122/72  Pulse: 82  Resp: 18  Temp: 98.2 F (36.8 C)  SpO2: 99%   Vitals:   10/09/21 1424  Weight: 134 lb 6.4 oz (61 kg)   Body mass index is 21.69 kg/m. Ideal Body Weight:  GEN: no acute distress.  Slight build, appears his normal self HEENT: Atraumatic, Normocephalic.  Ears and Nose: No external deformity. CV: RRR, No M/G/R. No JVD. No  thrill. No extra heart sounds. PULM: CTA B, no wheezes, crackles, rhonchi. No retractions. No resp. distress. No accessory muscle use. ABD: S, NT, ND, +BS. No rebound. No HSM.  Belly is benign EXTR: No c/c/e PSYCH: Normally interactive. Conversant.  Brown stool on rectal exam, Hemoccult positive  Results for orders placed or performed in visit on 10/09/21  POCT Occult Blood Stool  Result Value Ref Range   Fecal Occult Blood, POC Positive (A) Negative   Card #1 Date [id number]    Card #2 Fecal Occult Blod, POC     Card #2 Date     Card #3 Fecal Occult Blood, POC     Card #3 Date      Assessment and Plan: Dark stools - Plan: pantoprazole (PROTONIX) 40 MG tablet, POCT Occult Blood Stool  Epigastric pain - Plan: CBC, pantoprazole (PROTONIX) 40 MG tablet, CANCELED: IFOBT POC (occult bld, rslt in office)  Patient seen today for follow-up of epigastric pain and dark stools.  Hemoccult is positive.  No evidence of major GI bleed, no bright red blood per rectum or hemoptysis I touched base with Dr. Haroldine Laws, okay to temporarily hold Eliquis.  I have passed this information to the patient's daughter who manages his pillbox We will start him on Protonix once a day.  CBC is pending. I have cautioned patient if he has evidence of more severe bleeding or if not feeling well please go back to the hospital I will get in touch with his gastroenterologist so patient can come in for a visit  Signed Lamar Blinks, MD  Received labs 2/10- Minimal drop in hemoglobin/hematocrit since 2/7 Patient has an appoint with GI on Tuesday the 14th Called in touch base with patient's daughter, she notes her dad is doing well.  They have started PPI, are holding Eliquis If any worsening over the weekend-return of black stools, bloody stools, pain or malaise-he will go to the ER Results for orders placed or performed in visit on 10/09/21  CBC  Result Value Ref Range   WBC 4.5 4.0 - 10.5 K/uL   RBC 4.42 4.22 -  5.81 Mil/uL   Platelets 214.0 150.0 - 400.0 K/uL   Hemoglobin 11.9 (L) 13.0 - 17.0 g/dL   HCT 37.0 (L) 39.0 - 52.0 %   MCV 83.7 78.0 - 100.0 fl   MCHC 32.1 30.0 - 36.0 g/dL   RDW 17.6 (H) 11.5 - 15.5 %  POCT Occult Blood Stool  Result Value Ref Range   Fecal Occult Blood, POC Positive (A) Negative   Card #1 Date [id number]    Card #2 Fecal Occult Blod, POC     Card #2 Date     Card #3 Fecal Occult Blood, POC     Card #3 Date

## 2021-10-07 NOTE — ED Notes (Signed)
Patient called x2 with no response and not visible in lobby

## 2021-10-09 ENCOUNTER — Ambulatory Visit (INDEPENDENT_AMBULATORY_CARE_PROVIDER_SITE_OTHER): Payer: Medicare Other | Admitting: Family Medicine

## 2021-10-09 VITALS — BP 122/72 | HR 82 | Temp 98.2°F | Resp 18 | Wt 134.4 lb

## 2021-10-09 DIAGNOSIS — R1013 Epigastric pain: Secondary | ICD-10-CM

## 2021-10-09 DIAGNOSIS — R195 Other fecal abnormalities: Secondary | ICD-10-CM

## 2021-10-09 LAB — HEMOCCULT GUIAC POC 1CARD (OFFICE)
Card #1 Date: 2092023
Fecal Occult Blood, POC: POSITIVE — AB

## 2021-10-09 MED ORDER — PANTOPRAZOLE SODIUM 40 MG PO TBEC: 40.0000 mg | DELAYED_RELEASE_TABLET | Freq: Every day | ORAL | 3 refills | Status: DC

## 2021-10-09 NOTE — Patient Instructions (Signed)
It was good to see you today!   I think you may have some stomach irritation that caused you to have blood in your stool Start taking the protonix daily I will check your blood counts today to check for any change  IF you start having red blood in your stool, or if any worsening of your symptoms seek care right away!  I will set you up to see your stomach doctor for the next steps

## 2021-10-10 ENCOUNTER — Telehealth: Payer: Self-pay

## 2021-10-10 LAB — CBC
HCT: 37 % — ABNORMAL LOW (ref 39.0–52.0)
Hemoglobin: 11.9 g/dL — ABNORMAL LOW (ref 13.0–17.0)
MCHC: 32.1 g/dL (ref 30.0–36.0)
MCV: 83.7 fl (ref 78.0–100.0)
Platelets: 214 10*3/uL (ref 150.0–400.0)
RBC: 4.42 Mil/uL (ref 4.22–5.81)
RDW: 17.6 % — ABNORMAL HIGH (ref 11.5–15.5)
WBC: 4.5 10*3/uL (ref 4.0–10.5)

## 2021-10-10 NOTE — Telephone Encounter (Signed)
Patient has been scheduled to see Nevin Bloodgood, NP on 10/14/21 at 1:30 pm. Patient's daughter is aware.

## 2021-10-10 NOTE — Telephone Encounter (Signed)
-----   Message from Milus Banister, MD sent at 10/10/2021  1:79 AM EST ----- Certainly, we will get him in soon   Kevina Piloto, He needs an appointment within the next 2 weeks with extender, myself, any available MD with an opening.  Thanks   DJ ----- Message ----- From: Darreld Mclean, MD Sent: 10/09/2021   9:05 PM EST To: Milus Banister, MD  Jacqulyn Cane, I saw Eulas Post today for follow-up. It seems he is having some GI bleeding.  He has noted epigastric discomfort, dark stools and was Hemoccult positive in the office today.  CBC from today is pending.  No bright red blood, he is hemodynamically stable.  I started him on Nexium and and having him hold Eliquis (taking for small pulmonary embolism, Bensimhon okayed temporary pause)  Could you guys bring him into the office in the next week or 2?  FYI over the last several months he has had a major decrease in cardiac function and is now being followed by the advanced heart failure team  Thank you!! Keane Scrape

## 2021-10-14 ENCOUNTER — Ambulatory Visit: Payer: Medicare Other | Admitting: Nurse Practitioner

## 2021-10-16 ENCOUNTER — Ambulatory Visit (HOSPITAL_COMMUNITY)
Admission: RE | Admit: 2021-10-16 | Discharge: 2021-10-16 | Disposition: A | Discharge status: Home / Self Care | Payer: Medicare Other | Source: Ambulatory Visit | Attending: Cardiology | Admitting: Cardiology

## 2021-10-16 ENCOUNTER — Other Ambulatory Visit: Payer: Self-pay

## 2021-10-16 DIAGNOSIS — I5040 Unspecified combined systolic (congestive) and diastolic (congestive) heart failure: Secondary | ICD-10-CM | POA: Diagnosis not present

## 2021-10-16 LAB — BASIC METABOLIC PANEL
Anion gap: 8 (ref 5–15)
BUN: 25 mg/dL — ABNORMAL HIGH (ref 8–23)
CO2: 30 mmol/L (ref 22–32)
Calcium: 9.3 mg/dL (ref 8.9–10.3)
Chloride: 95 mmol/L — ABNORMAL LOW (ref 98–111)
Creatinine, Ser: 1.86 mg/dL — ABNORMAL HIGH (ref 0.61–1.24)
GFR, Estimated: 38 mL/min — ABNORMAL LOW (ref >60)
Glucose, Bld: 116 mg/dL — ABNORMAL HIGH (ref 70–99)
Potassium: 4.2 mmol/L (ref 3.5–5.1)
Sodium: 133 mmol/L — ABNORMAL LOW (ref 135–145)

## 2021-10-21 ENCOUNTER — Telehealth: Payer: Self-pay

## 2021-10-21 ENCOUNTER — Telehealth: Payer: Medicare Other

## 2021-10-21 NOTE — Telephone Encounter (Signed)
°  Care Management   Follow Up Note   10/21/2021 Name: [name] MRN: [id number] DOB: 1951/07/25   Referred by: Darreld Mclean, MD Reason for referral : No chief complaint on file.   An unsuccessful telephone outreach was attempted today. The patient was referred to the case management team for assistance with care management and care coordination. RNCM called home number-no answer/HIPAA compliant voice message left. RNCM contacted daughter, [name] [facility]). She reports she had cancelled this telephone call because she knew that neither would be available. Request to have care guide call back to reschedule.  Follow Up Plan: The care management team will reach out to the patient again over the next 30 days.   Thea Silversmith, RN, MSN, BSN, CCM Care Management Coordinator [facility] [phone]

## 2021-10-29 ENCOUNTER — Ambulatory Visit (INDEPENDENT_AMBULATORY_CARE_PROVIDER_SITE_OTHER): Payer: Medicare Other | Admitting: Nurse Practitioner

## 2021-10-29 ENCOUNTER — Encounter: Payer: Self-pay | Admitting: Nurse Practitioner

## 2021-10-29 ENCOUNTER — Other Ambulatory Visit (INDEPENDENT_AMBULATORY_CARE_PROVIDER_SITE_OTHER): Payer: Medicare Other

## 2021-10-29 ENCOUNTER — Telehealth: Payer: Self-pay

## 2021-10-29 VITALS — BP 90/60 | HR 88 | Ht 66.0 in | Wt 135.4 lb

## 2021-10-29 DIAGNOSIS — R195 Other fecal abnormalities: Secondary | ICD-10-CM

## 2021-10-29 DIAGNOSIS — R1013 Epigastric pain: Secondary | ICD-10-CM

## 2021-10-29 DIAGNOSIS — D649 Anemia, unspecified: Secondary | ICD-10-CM | POA: Diagnosis not present

## 2021-10-29 LAB — CBC
HCT: 38.7 % — ABNORMAL LOW (ref 39.0–52.0)
Hemoglobin: 12.3 g/dL — ABNORMAL LOW (ref 13.0–17.0)
MCHC: 31.8 g/dL (ref 30.0–36.0)
MCV: 83.4 fl (ref 78.0–100.0)
Platelets: 202 10*3/uL (ref 150.0–400.0)
RBC: 4.64 Mil/uL (ref 4.22–5.81)
RDW: 15.7 % — ABNORMAL HIGH (ref 11.5–15.5)
WBC: 3.7 10*3/uL — ABNORMAL LOW (ref 4.0–10.5)

## 2021-10-29 NOTE — Telephone Encounter (Signed)
Author: Milus Banister, MD Service: Gastroenterology Author Type: Physician  ?Filed: 10/29/2021  3:05 PM Encounter Date: 10/29/2021 Status: Signed  ?Editor: Milus Banister, MD (Physician)  ?   ?   ?   ?   ?   ?I do not think that is endoscopy should be done in our outpatient endoscopy center.  I know his ejection fraction was improved recently but just 2 months ago or 3 months ago he was in cardiogenic shock and his ejection fraction has been low over the past 2 or 3 years in the 20 to 25%.  The dark stools might have been Pepto.  He was Hemoccult positive but his hemoglobin today was better than it was 2 or 3 days ago and so he is not having overt GI bleeding. ?  ?I think it is much safer to put him in line for my next available outpatient hospital based case.  That is not until the end of April.  Certainly if he has overt GI bleeding prior to then he will need to be hospitalized for his care. ?  ?Thanks  ?  ? ?

## 2021-10-29 NOTE — Telephone Encounter (Signed)
-----   Message from Milus Banister, MD sent at 10/29/2021  3:04 PM EST ----- ? ? ? ?----- Message ----- ?From: Willia Craze, NP ?Sent: 10/29/2021   2:21 PM EST ?To: Milus Banister, MD ? ?

## 2021-10-29 NOTE — Progress Notes (Signed)
I do not think that is endoscopy should be done in our outpatient endoscopy center.  I know his ejection fraction was improved recently but just 2 months ago or 3 months ago he was in cardiogenic shock and his ejection fraction has been low over the past 2 or 3 years in the 20 to 25%.  The dark stools might have been Pepto.  He was Hemoccult positive but his hemoglobin today was better than it was 2 or 3 days ago and so he is not having overt GI bleeding. ? ?I think it is much safer to put him in line for my next available outpatient hospital based case.  That is not until the end of April.  Certainly if he has overt GI bleeding prior to then he will need to be hospitalized for his care. ? ?Thanks ? ?

## 2021-10-29 NOTE — Progress Notes (Signed)
Agree with Dr. Ardis Hughs.  Patient's comorbidities make him high risk for sedation in [place], and it does not seem that he is having any active bleeding at this time.  Recommend EGD in hospital setting ?

## 2021-10-29 NOTE — Progress Notes (Signed)
ASSESSMENT AND PLAN    # 71 yo male with epigastric pain, black stool which may be secondary to pepto but heme positive by PCP on 2/8. Associated 1 gram decline in hgb 13 >> 12.2 >> 11.9. On Eliquis. Takes baby asa, no other NSAIDs. Rule out PUD, AVM.  --Eliquis on hold since PCP visit on 2/9.  --Needs EGD. The risks and benefits of EGD with possible biopsies were discussed with the patient and his daughter ( over the phone). Patient agrees to proceed. I am able to get an EGD done with Dr. Candis Schatz within the next week. Based on Echo patient would be able to have the procedure done at the [facility] but will discuss further with his primary GI Dr. Ardis Hughs.  --Continue Pantoprazole --CBC today to make sure hgb is stable.  # History of a colon tubular adenoma June 2021. Needs follow up colonoscopy June 2028                                   # PE , diagnosed October 2022. Eliquis currently on hold out of concern for Gi bleed  #Remote history of esophageal cancer s/p chemotherapy and radiation in 2010. Followed by Dr. Marin Olp  # Very hard of hearing.    HISTORY OF PRESENT ILLNESS    Chief Complaint : recent upper abdominal pain  Collin Young is a 71 y.o. 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   Patient tells me he that in the beginning of February he was having a hard time belching and this led to epigastric pain. He was seen in ED on 2/7 for chest pain , abdominal pain and black stools but had been taking pepto bismuth. In ED Hgb was 12.2, down from 13 two months prior. He left prior to being seen as ED was too busy. He had a follow up with his PCP on 2/9. He was heme  positive. She held Eliquis, reached out to [place] for an appt. Follow up CBC showed heme was stable at 11.9. He hasn't had any black stools in almost 2 weeks. He feels okay. The epigastric pain resolved several days ago. He has a hoarse voice. Taking PPI and not bother with reflux symptoms. He has no dysphagia.   He denies NSAID use other than a daily baby asa    LABORATORY DATA  Hepatic Function Latest Ref Rng & Units 10/07/2021 08/01/2021 07/06/2021  Total Protein 6.5 - 8.1 g/dL 6.9 7.3 6.9  Albumin 3.5 - 5.0 g/dL 3.7 3.9 3.2(L)  AST 15 - 41 U/L '22 20 30  ' ALT 0 - 44 U/L 11 12 56(H)  Alk Phosphatase 38 - 126 U/L 32(L) 51 57  Total Bilirubin 0.3 - 1.2 mg/dL 0.3 0.4 0.9  Bilirubin, Direct 0.00 - 0.40 mg/dL - - -    CBC Latest Ref Rng & Units 10/09/2021 10/07/2021 08/01/2021  WBC 4.0 - 10.5 K/uL 4.5 5.6 4.4  Hemoglobin 13.0 - 17.0 g/dL 11.9(L) 12.2(L) 13.0  Hematocrit 39.0 - 52.0 % 37.0(L) 39.0 41.8  Platelets 150.0 - 400.0 K/uL 214.0 204 188  Lab Results  Component Value Date   LIPASE 31 01/07/2018   10/02/21 echolcardiogram  IMPRESSIONS Left ventricular ejection fraction, by estimation, is 40 to 45%. The left ventricle has mildly decreased function. The left ventricle demonstrates regional wall motion abnormalities (see scoring diagram/findings for description). Left ventricular diastolic parameters are consistent with Grade I diastolic dysfunction (impaired relaxation). There is hypokinesis of the left ventricular, septal wall, anteroseptal wall, inferoseptal wall and inferior wall.  2. Right ventricular systolic function is normal. The right ventricular size is normal. The mitral valve is normal in structure. Mild mitral valve regurgitation. No evidence of mitral stenosis. Moderate mitral annular calcification. 3. The aortic valve is tricuspid. There is mild calcification of the aortic valve. Aortic valve regurgitation is mild. No aortic stenosis is present. 4. The inferior vena  cava is normal in size with greater than 50% respiratory variability, suggesting right atrial pressure of 3 mmHg. 5. FINDINGS Left Ventricle: Left ventricular ejection fraction, by estimation, is 40 to 45%. The left   PREVIOUS ENDOSCOPIC EVALUATIONS    Colonoscopy June 2021 for bowel changes --Diverticulosis in the entire examined colon. - One 3 mm polyp in the ascending colon, removed with a cold snare. Resected and retrieved. - The examination was otherwise normal on direct and retroflexion views. - Biopsies were taken with a cold forceps from the entire colon for evaluation of microscopic colitis.  Diagnosis 1. Surgical [P], colon, random sites - FOCAL ACTIVE COLITIS - NO GRANULOMATA, DYSPLASIA OR MALIGNANCY IDENTIFIED 2. Surgical [P], colon, ascending, polyp - TUBULAR ADENOMA (1 OF 2 FRAGMENTS) - BENIGN COLONIC MUCOSA (1 OF 2 FRAGMENTS) - NO HIGH GRADE DYSPLASIA OR MALIGNANCY IDENTIFIED Microscopic Comment 1. The  Past Medical History:  Diagnosis Date   Chronic combined systolic and diastolic CHF (congestive heart failure) ([place])    followed by [name]   COPD (chronic obstructive pulmonary disease) ([place])    Coronary artery disease cardiologist-- [name]   per cardiac cath 01-05-2017  chronic total occlusion pLCx with collaterals, 99% severe calcified prox. to mid RCA, otherwise mild to moderate CAD (medically managed)   Esophageal cancer, stage IIIB [facility]) oncologist-  dr ennever/  dr moody   dx 2010  SCC Stage IIIB completed chemoradiation;  localized recurrent left piriform sinus 01/ 2015,  completed concurrent chemoradiatoin 04/ 2015   GERD (gastroesophageal reflux disease)    09-07-2018   no issues since Gtube removed 06/ 2019   History of alcohol abuse    quit 2001   History of cancer chemotherapy    2010;   2015   History of radiation therapy    10-19-2013 to  12-05-2013 pyriform sinus 69.96 Gy/20f;   Radiation completed 2010 for esophageal cancer    History of seizure 2001   alcohol withdrawal   HOH (hard of hearing)    Hyperlipidemia    Hypertension    Ischemic cardiomyopathy 12/2016   01-03-2017  echo,  ef 10-15%/   echo 05-06-2017 EF improved to 45-50%   Prostate cancer ([facility] urologist-  dr ottelin/  oncologist-- dr mTammi Klippel  dx 05-27-2018--- Stage T1c,  Gleason 4+3,  PSA 9.3--  scheduled for brachytherapy 09-23-2017   Renal cyst, left     Past Surgical History:  Procedure Laterality Date   CARDIOVASCULAR STRESS TEST  10/09/09   normal nuclearr stress test, EF 57% ([name]   CENTRAL LINE INSERTION  06/16/2021   Procedure: CENTRAL LINE INSERTION;  Surgeon: BJolaine Artist MD;  [facility];  Service: Cardiovascular;;   IR GASTROSTOMY TUBE MOD SED  01/13/2017   IR GASTROSTOMY TUBE REMOVAL  02/03/2018   IR PATIENT EVAL TECH 0-60 MINS  03/25/2017   IR REMOVAL TUN ACCESS W/ PORT W/O FL MOD SED  01/15/2017   IR REPLACE G-TUBE SIMPLE WO FLUORO  01/13/2018   LARYNGOSCOPY N/A 09/15/2013   Procedure: LARYNGOSCOPY;  Surgeon: [name], MD;  Location: [place];  Service: ENT;  Laterality: N/A;  direct laryngoscopy with biopsy and esophagoscopy   RADIOACTIVE SEED IMPLANT N/A 09/23/2018   Procedure: RADIOACTIVE SEED IMPLANT/BRACHYTHERAPY IMPLANT;  Surgeon: [name], MD;  Location: [place];  Service: Urology;  Laterality: N/A;   RIGHT HEART CATH N/A 06/16/2021   Procedure: RIGHT HEART CATH;  Surgeon: Jolaine Artist, MD;  Location: [place] CV LAB;  Service: Cardiovascular;  Laterality: N/A;   RIGHT/LEFT HEART CATH AND CORONARY ANGIOGRAPHY N/A 01/05/2017   Procedure: Right/Left Heart Cath and Coronary Angiography;  Surgeon: [name], MD;  Location: [place] CV LAB;  Service: Cardiovascular;  Laterality: N/A;   TRANSTHORACIC ECHOCARDIOGRAM  05/06/2017   ef 45-50%,  grade 1 diastolic dysfunction/  AV severe calcified non coronary cusp with moderate regurg. , no stenosis (valve  area per echo 01-05-2017 1.08cm^2)/  mild MR, TR, and PR/  mild LAE      Current Medications, Allergies, Family History and Social History were reviewed in [employer] record.     Current Outpatient Medications  Medication Sig Dispense Refill   albuterol (VENTOLIN HFA) 108 (90 Base) MCG/ACT inhaler INHALE 2 PUFFS INTO THE LUNGS EVERY 6 HOURS AS NEEDED FOR WHEEZING OR SHORTNESS OF BREATH 20.1 g 1   apixaban (ELIQUIS) 5 MG TABS tablet Take 1 tablet (5 mg total) by mouth 2 (two) times daily. 60 tablet 6   BAYER LOW DOSE 81 MG EC tablet Take 81 mg by mouth daily. Swallow whole.     dapagliflozin propanediol (FARXIGA) 10 MG TABS tablet Take 1 tablet (10 mg total) by mouth daily. 30 tablet 2   Digoxin 62.5 MCG TABS Take 0.0625 mg by mouth daily. 30 tablet 6   Evolocumab (REPATHA SURECLICK) 588 MG/ML SOAJ Inject 140 mg into the skin every 14 (fourteen) days. 2 mL 11   fentaNYL (DURAGESIC) 12 MCG/HR Place 1 patch onto the skin every 3 (three) days. 10 patch 0   furosemide (LASIX) 40 MG tablet Take 1 tablet (40 mg total) by mouth daily. 30 tablet 3   hydrALAZINE (APRESOLINE) 25 MG tablet Take 1.5 tablets (37.5 mg total) by mouth every 8 (eight) hours. 135 tablet 3   ipratropium (ATROVENT) 0.03 % nasal spray Place 2 sprays into both nostrils every 12 (twelve) hours. 30 mL 12   ivabradine (CORLANOR) 5 MG TABS tablet Take 0.5 tablets (2.5 mg total) by mouth 2 (two) times daily with a meal. 30 tablet 3   Multiple Vitamin (MULTIVITAMIN) tablet Take 1 tablet by mouth 3 (three) times a week.     pantoprazole (PROTONIX) 40 MG tablet Take 1 tablet (40 mg total) by mouth daily. 30 tablet 3   polyethylene glycol (MIRALAX / GLYCOLAX) 17 g packet Take 17 g by mouth daily as needed for mild constipation. 14 each 0   pravastatin (PRAVACHOL) 80 MG tablet Take 1 tablet (80 mg total) by mouth every evening. 90 tablet 3   sacubitril-valsartan (ENTRESTO) 49-51 MG Take 1 tablet by mouth 2 (two)  times daily. 60 tablet 11  sildenafil (VIAGRA) 50 MG tablet Take 1 tablet (50 mg total) by mouth daily as needed for erectile dysfunction. 10 tablet 0   spironolactone (ALDACTONE) 25 MG tablet Take 1 tablet (25 mg total) by mouth daily. 30 tablet 6   No current facility-administered medications for this visit.   Facility-Administered Medications Ordered in Other Visits  Medication Dose Route Frequency Provider Last Rate Last Admin   topical emolient (BIAFINE) emulsion   Topical Daily Kyung Rudd, MD   Given at 01/19/14 3225    Review of Systems: No chest pain. No shortness of breath. No urinary complaints.   PHYSICAL EXAM :    Wt Readings from Last 3 Encounters:  10/29/21 135 lb 6 oz (61.4 kg)  10/09/21 134 lb 6.4 oz (61 kg)  10/02/21 132 lb (59.9 kg)    BP 90/60    Pulse 88    Ht '5\' 6"'  (1.676 m)    Wt 135 lb 6 oz (61.4 kg)    BMI 21.85 kg/m  Constitutional:  Generally well appearing male in no acute distress. Psychiatric: Pleasant. Normal mood and affect. Behavior is normal. EENT: Pupils normal.  Conjunctivae are normal. No scleral icterus. HOH Neck supple.  Cardiovascular: Normal rate, regular rhythm. No edema Pulmonary/chest: Effort normal and breath sounds normal. No wheezing, rales or rhonchi. Abdominal: Soft, nondistended, nontender. Bowel sounds active throughout. There are no masses palpable. No hepatomegaly. Neurological: Alert and oriented to person place and time. Skin: Skin is warm and dry. No rashes noted.  Tye Savoy, NP  10/29/2021, 11:20 AM  Cc:  Darreld Mclean, MD

## 2021-10-29 NOTE — Patient Instructions (Addendum)
You have been scheduled for an endoscopy. Please follow written instructions given to you at your visit today. ?If you use inhalers (even only as needed), please bring them with you on the day of your procedure. ? ?Your provider has requested that you go to the basement level for lab work before leaving today. Press "B" on the elevator. The lab is located at the first door on the left as you exit the elevator. ? ? ?Continue to hold your Eliquis until after your procedure.  ? ?Thank you for choosing me and [facility]. ? ?Tye Savoy NP  ? ?

## 2021-10-30 ENCOUNTER — Other Ambulatory Visit: Payer: Self-pay

## 2021-10-30 DIAGNOSIS — R1013 Epigastric pain: Secondary | ICD-10-CM

## 2021-10-30 DIAGNOSIS — D649 Anemia, unspecified: Secondary | ICD-10-CM

## 2021-10-30 DIAGNOSIS — R195 Other fecal abnormalities: Secondary | ICD-10-CM

## 2021-10-30 NOTE — Telephone Encounter (Signed)
EGD scheduled with DJ at [facility] on 4/20 at 1045 am ?

## 2021-10-30 NOTE — Telephone Encounter (Signed)
--  Eliquis on hold since PCP visit on 2/9.  ?

## 2021-10-30 NOTE — Telephone Encounter (Signed)
EGD scheduled, pt daughter instructed and medications reviewed.  Patient instructions mailed to home and sent to My Chart.  Patient to call with any questions or concerns. ? ?

## 2021-10-31 ENCOUNTER — Ambulatory Visit: Payer: Medicare Other | Admitting: Hematology & Oncology

## 2021-10-31 ENCOUNTER — Inpatient Hospital Stay: Payer: Medicare Other

## 2021-11-03 ENCOUNTER — Encounter: Payer: Medicare Other | Admitting: Gastroenterology

## 2021-11-05 ENCOUNTER — Other Ambulatory Visit: Payer: Self-pay

## 2021-11-05 ENCOUNTER — Other Ambulatory Visit (HOSPITAL_BASED_OUTPATIENT_CLINIC_OR_DEPARTMENT_OTHER): Payer: Self-pay

## 2021-11-05 DIAGNOSIS — C61 Malignant neoplasm of prostate: Secondary | ICD-10-CM

## 2021-11-05 DIAGNOSIS — C12 Malignant neoplasm of pyriform sinus: Secondary | ICD-10-CM

## 2021-11-05 MED ORDER — FENTANYL 12 MCG/HR TD PT72
1.0000 | MEDICATED_PATCH | TRANSDERMAL | 0 refills | Status: DC
  Filled 2021-11-05: qty 10, 30d supply, fill #0

## 2021-11-10 ENCOUNTER — Telehealth: Payer: Self-pay

## 2021-11-10 ENCOUNTER — Other Ambulatory Visit (HOSPITAL_BASED_OUTPATIENT_CLINIC_OR_DEPARTMENT_OTHER): Payer: Self-pay

## 2021-11-10 NOTE — Telephone Encounter (Signed)
[facility] Pre-operative Risk Assessment  ?   ?Request for surgical clearance:     Endoscopy Procedure ? ?What type of surgery is being performed?     EGD ? ?When is this surgery scheduled?     12/18/21 ? ?What type of clearance is required ?   Pharmacy ? ?Are there any medications that need to be held prior to surgery and how long? Eliquis to be held for 2 days prior to EGD ? ?Practice name and name of physician performing surgery?      [facility] with Dr Owens Loffler ? ?What is your office phone and fax number?      Phone- [phone]  Fax- [phone] ? ?Anesthesia type (None, local, MAC, general) ?       [facility]  ?

## 2021-11-12 ENCOUNTER — Encounter: Payer: Self-pay | Admitting: Hematology & Oncology

## 2021-11-12 ENCOUNTER — Other Ambulatory Visit: Payer: Self-pay

## 2021-11-12 ENCOUNTER — Inpatient Hospital Stay (HOSPITAL_BASED_OUTPATIENT_CLINIC_OR_DEPARTMENT_OTHER): Payer: Medicare Other | Admitting: Hematology & Oncology

## 2021-11-12 ENCOUNTER — Inpatient Hospital Stay: Payer: Medicare Other | Attending: Hematology & Oncology

## 2021-11-12 VITALS — BP 136/56 | HR 83 | Temp 97.6°F | Resp 18 | Ht 66.0 in | Wt 136.0 lb

## 2021-11-12 DIAGNOSIS — C12 Malignant neoplasm of pyriform sinus: Secondary | ICD-10-CM

## 2021-11-12 DIAGNOSIS — R079 Chest pain, unspecified: Secondary | ICD-10-CM

## 2021-11-12 DIAGNOSIS — I5023 Acute on chronic systolic (congestive) heart failure: Secondary | ICD-10-CM

## 2021-11-12 DIAGNOSIS — Z8546 Personal history of malignant neoplasm of prostate: Secondary | ICD-10-CM | POA: Diagnosis not present

## 2021-11-12 DIAGNOSIS — C61 Malignant neoplasm of prostate: Secondary | ICD-10-CM

## 2021-11-12 DIAGNOSIS — Z859 Personal history of malignant neoplasm, unspecified: Secondary | ICD-10-CM | POA: Diagnosis not present

## 2021-11-12 DIAGNOSIS — Z79899 Other long term (current) drug therapy: Secondary | ICD-10-CM | POA: Insufficient documentation

## 2021-11-12 DIAGNOSIS — D500 Iron deficiency anemia secondary to blood loss (chronic): Secondary | ICD-10-CM | POA: Diagnosis not present

## 2021-11-12 DIAGNOSIS — Z7901 Long term (current) use of anticoagulants: Secondary | ICD-10-CM | POA: Diagnosis not present

## 2021-11-12 LAB — CBC WITH DIFFERENTIAL (CANCER CENTER ONLY)
Abs Immature Granulocytes: 0.01 10*3/uL (ref 0.00–0.07)
Basophils Absolute: 0 10*3/uL (ref 0.0–0.1)
Basophils Relative: 0 %
Eosinophils Absolute: 0.1 10*3/uL (ref 0.0–0.5)
Eosinophils Relative: 2 %
HCT: 41.7 % (ref 39.0–52.0)
Hemoglobin: 12.8 g/dL — ABNORMAL LOW (ref 13.0–17.0)
Immature Granulocytes: 0 %
Lymphocytes Relative: 35 %
Lymphs Abs: 1.7 10*3/uL (ref 0.7–4.0)
MCH: 26.4 pg (ref 26.0–34.0)
MCHC: 30.7 g/dL (ref 30.0–36.0)
MCV: 86.2 fL (ref 80.0–100.0)
Monocytes Absolute: 0.8 10*3/uL (ref 0.1–1.0)
Monocytes Relative: 16 %
Neutro Abs: 2.3 10*3/uL (ref 1.7–7.7)
Neutrophils Relative %: 47 %
Platelet Count: 215 10*3/uL (ref 150–400)
RBC: 4.84 MIL/uL (ref 4.22–5.81)
RDW: 14.4 % (ref 11.5–15.5)
WBC Count: 4.8 10*3/uL (ref 4.0–10.5)
nRBC: 0 % (ref 0.0–0.2)

## 2021-11-12 LAB — CMP (CANCER CENTER ONLY)
ALT: 9 U/L (ref 0–44)
AST: 18 U/L (ref 15–41)
Albumin: 4.7 g/dL (ref 3.5–5.0)
Alkaline Phosphatase: 31 U/L — ABNORMAL LOW (ref 38–126)
Anion gap: 8 (ref 5–15)
BUN: 22 mg/dL (ref 8–23)
CO2: 32 mmol/L (ref 22–32)
Calcium: 10.6 mg/dL — ABNORMAL HIGH (ref 8.9–10.3)
Chloride: 96 mmol/L — ABNORMAL LOW (ref 98–111)
Creatinine: 1.48 mg/dL — ABNORMAL HIGH (ref 0.61–1.24)
GFR, Estimated: 51 mL/min — ABNORMAL LOW (ref >60)
Glucose, Bld: 99 mg/dL (ref 70–99)
Potassium: 4.4 mmol/L (ref 3.5–5.1)
Sodium: 136 mmol/L (ref 135–145)
Total Bilirubin: 0.3 mg/dL (ref 0.3–1.2)
Total Protein: 7.5 g/dL (ref 6.5–8.1)

## 2021-11-12 LAB — LACTATE DEHYDROGENASE: LDH: 179 U/L (ref 98–192)

## 2021-11-12 NOTE — Progress Notes (Signed)
?Hematology and Oncology Follow Up Visit ? ?Collin Young ?[id number] ?16-Apr-1951 71 y.o. ?11/12/2021 ? ? ?Principle Diagnosis:  ?Stage II (T2 N0M0) squamous cell carcinoma of the left piriform sinus - completed therapy in April 2015 ?Stage IIIB squamous cell carcinoma of the esophagus-remission - completed therapy in 2010 ?Congestive heart failure ?Stage T1c prostate cancer ?Pulmonary Embolism ? ?Current Therapy:   ?Status post prostate brachytherapy  ?Eliquis 5 mg po BID ?    ?Interim History:  Mr.  Young is back for followup.  We last saw him back in December.  Since then, he has been doing pretty well.  He has been having his cardiology follow-ups.  He had an echocardiogram done in February.  This showed an ejection fraction of 40-45%.  This is clearly somewhat better than what he had before.  I suppose that the cardiac medications are helping him. ? ?He is eating okay.  He is having no problems with his bowels or bladder.  He did have history of prostate cancer.  He is urinating okay. ? ?He tells me that he may be undergoing an upper endoscopy.  I will know if this is for any kind of swallowing issues.  I think he has had a stricture in the past. ? ?He continues on the Eliquis.  He is doing okay on the Eliquis.  Is on 5 mg p.o. twice daily. ? ?He has had no obvious bleeding. ? ?There is been no leg swelling.  He has had no rashes. ? ?Overall, I would say his performance status is probably ECOG 2.   ? ?Medications:  ?Current Outpatient Medications:  ?  albuterol (VENTOLIN HFA) 108 (90 Base) MCG/ACT inhaler, INHALE 2 PUFFS INTO THE LUNGS EVERY 6 HOURS AS NEEDED FOR WHEEZING OR SHORTNESS OF BREATH, Disp: 20.1 g, Rfl: 1 ?  apixaban (ELIQUIS) 5 MG TABS tablet, Take 1 tablet (5 mg total) by mouth 2 (two) times daily., Disp: 60 tablet, Rfl: 6 ?  BAYER LOW DOSE 81 MG EC tablet, Take 81 mg by mouth daily. Swallow whole., Disp: , Rfl:  ?  dapagliflozin propanediol (FARXIGA) 10 MG TABS tablet, Take 1 tablet (10 mg  total) by mouth daily., Disp: 30 tablet, Rfl: 2 ?  digoxin (LANOXIN) 0.125 MG tablet, Take by mouth., Disp: , Rfl:  ?  Evolocumab (REPATHA SURECLICK) 732 MG/ML SOAJ, Inject 140 mg into the skin every 14 (fourteen) days., Disp: 2 mL, Rfl: 11 ?  fentaNYL (DURAGESIC) 12 MCG/HR, Place 1 patch onto the skin every 3 (three) days., Disp: 10 patch, Rfl: 0 ?  furosemide (LASIX) 40 MG tablet, Take 1 tablet (40 mg total) by mouth daily., Disp: 30 tablet, Rfl: 3 ?  hydrALAZINE (APRESOLINE) 25 MG tablet, Take 1.5 tablets (37.5 mg total) by mouth every 8 (eight) hours., Disp: 135 tablet, Rfl: 3 ?  ipratropium (ATROVENT) 0.03 % nasal spray, Place 2 sprays into both nostrils every 12 (twelve) hours., Disp: 30 mL, Rfl: 12 ?  ivabradine (CORLANOR) 5 MG TABS tablet, Take 0.5 tablets (2.5 mg total) by mouth 2 (two) times daily with a meal., Disp: 30 tablet, Rfl: 3 ?  Multiple Vitamin (MULTIVITAMIN) tablet, Take 1 tablet by mouth 3 (three) times a week., Disp: , Rfl:  ?  pantoprazole (PROTONIX) 40 MG tablet, Take 1 tablet (40 mg total) by mouth daily., Disp: 30 tablet, Rfl: 3 ?  polyethylene glycol (MIRALAX / GLYCOLAX) 17 g packet, Take 17 g by mouth daily as needed for mild constipation., Disp: 14 each,  Rfl: 0 ?  pravastatin (PRAVACHOL) 80 MG tablet, Take 1 tablet (80 mg total) by mouth every evening., Disp: 90 tablet, Rfl: 3 ?  sacubitril-valsartan (ENTRESTO) 49-51 MG, Take 1 tablet by mouth 2 (two) times daily., Disp: 60 tablet, Rfl: 11 ?  sildenafil (VIAGRA) 50 MG tablet, Take 1 tablet (50 mg total) by mouth daily as needed for erectile dysfunction., Disp: 10 tablet, Rfl: 0 ?  spironolactone (ALDACTONE) 25 MG tablet, Take 1 tablet (25 mg total) by mouth daily., Disp: 30 tablet, Rfl: 6 ?No current facility-administered medications for this visit. ? ?Facility-Administered Medications Ordered in Other Visits:  ?  topical emolient (BIAFINE) emulsion, , Topical, Daily, Kyung Rudd, MD, Given at 01/19/14 [phone] ? ?Allergies: No Known  Allergies ? ?Past Medical History, Surgical history, Social history, and Family History were reviewed and updated. ? ?Review of Systems: ?Review of Systems  ?Constitutional: Negative.   ?HENT: Negative.    ?Eyes: Negative.   ?Respiratory: Negative.    ?Cardiovascular: Negative.   ?Gastrointestinal: Negative.   ?Genitourinary: Negative.   ?Musculoskeletal: Negative.   ?Skin: Negative.   ?Neurological: Negative.   ?Endo/Heme/Allergies: Negative.   ?Psychiatric/Behavioral: Negative.    ? ? ?Physical Exam: ? height is '5\' 6"'$  (1.676 m) and weight is 61.7 kg. His oral temperature is 97.6 ?F (36.4 ?C). His blood pressure is 136/56 (abnormal) and his pulse is 83. His respiration is 18 and oxygen saturation is 100%.  ? ?Physical Exam ?Vitals reviewed.  ?HENT:  ?   Head: Normocephalic and atraumatic.  ?Eyes:  ?   Pupils: Pupils are equal, round, and reactive to light.  ?Cardiovascular:  ?   Rate and Rhythm: Normal rate and regular rhythm.  ?   Heart sounds: Normal heart sounds.  ?Pulmonary:  ?   Effort: Pulmonary effort is normal.  ?   Breath sounds: Normal breath sounds.  ?Abdominal:  ?   General: Bowel sounds are normal.  ?   Palpations: Abdomen is soft.  ?   Comments: Abdominal exam shows a soft abdomen.  Bowel sounds are present.  He has the healed G-tube opening.  There is no exudate.  He has no fluid wave.  There is no palpable liver or spleen tip.  ?Musculoskeletal:     ?   General: No tenderness or deformity. Normal range of motion.  ?   Cervical back: Normal range of motion.  ?Lymphadenopathy:  ?   Cervical: No cervical adenopathy.  ?Skin: ?   General: Skin is warm and dry.  ?   Findings: No erythema or rash.  ?Neurological:  ?   Mental Status: He is alert and oriented to person, place, and time.  ?Psychiatric:     ?   Behavior: Behavior normal.     ?   Thought Content: Thought content normal.     ?   Judgment: Judgment normal.  ? ? ?Lab Results  ?Component Value Date  ? WBC 4.8 11/12/2021  ? HGB 12.8 (L) 11/12/2021   ? HCT 41.7 11/12/2021  ? MCV 86.2 11/12/2021  ? PLT 215 11/12/2021  ? ?  Chemistry   ?   ?Component Value Date/Time  ? NA 136 11/12/2021 1522  ? NA 135 05/29/2020 1556  ? NA 139 07/30/2017 0926  ? NA 137 03/10/2017 1000  ? K 4.4 11/12/2021 1522  ? K 3.9 07/30/2017 0926  ? K 4.3 03/10/2017 1000  ? CL 96 (L) 11/12/2021 1522  ? CL 96 (L) 07/30/2017 0926  ? CO2  32 11/12/2021 1522  ? CO2 33 07/30/2017 0926  ? CO2 29 03/10/2017 1000  ? BUN 22 11/12/2021 1522  ? BUN 23 05/29/2020 1556  ? BUN 17 07/30/2017 0926  ? BUN 14.7 03/10/2017 1000  ? CREATININE 1.48 (H) 11/12/2021 1522  ? CREATININE 1.4 (H) 07/30/2017 0926  ? CREATININE 1.1 03/10/2017 1000  ?    ?Component Value Date/Time  ? CALCIUM 10.6 (H) 11/12/2021 1522  ? CALCIUM 9.6 07/30/2017 0926  ? CALCIUM 10.0 03/10/2017 1000  ? ALKPHOS 31 (L) 11/12/2021 1522  ? ALKPHOS 49 07/30/2017 0926  ? ALKPHOS 51 03/10/2017 1000  ? AST 18 11/12/2021 1522  ? AST 23 03/10/2017 1000  ? ALT 9 11/12/2021 1522  ? ALT 31 07/30/2017 0926  ? ALT 18 03/10/2017 1000  ? BILITOT 0.3 11/12/2021 1522  ? BILITOT 0.41 03/10/2017 1000  ?  ? ? ? ?Impression and Plan: ?Collin Young is 71 year old gentleman. He had a localized recurrence of carcinoma of the left pyriform sinus. He underwent concurrent chemotherapy and radiation therapy. He finished his treatment back in April of 2015.  ? ?I am so happy that his cardiac function is doing better.  He is still on anticoagulation I think will need lifelong anticoagulation even if his cardiac function continues to improve. ? ?At some point, we will have to repeat his CT angiogram to see how everything looks with a pulmonary embolism.  I will get this when I see him back. ? ?I would like to see him back in about 4 months. ? ? ? ?3/15/20234:16 PM ? ?

## 2021-11-12 NOTE — Telephone Encounter (Signed)
Patient with diagnosis of PE 05/2021 on Eliquis for anticoagulation, also hypercoagulable with hx of cancer. ? ?Procedure: EGD ?Date of procedure: 12/18/21 ? ?CrCl 55m/min ?Platelet count 215K ? ?Recommend that pt only hold Eliquis for 1 day prior to procedure given recent PE and resume as soon as safely possible after. ?

## 2021-11-12 NOTE — Telephone Encounter (Signed)
Clinical pharmacist to review Eliquis.  Eliquis is for PE.  Initial CTA obtained on 05/08/2021 revealed unopacified lower lobe pulmonary arteries bilaterally, unclear if due to reduced cardiac output versus PE.  Repeat CTA of the chest obtained on 06/11/2021 confirmed the presence of segmental pulmonary emboli in the right lower lobe.  ? ?Based on Dr. Rosezella [place] previous consultation note on 06/12/2021, " I would suggest that he might be lifelong DOAC given the fact that this might be unprovoked as he is mobile and he has high risk with a history of cancers." ? ? ?

## 2021-11-13 ENCOUNTER — Telehealth: Payer: Self-pay | Admitting: *Deleted

## 2021-11-13 LAB — IRON AND IRON BINDING CAPACITY (CC-WL,HP ONLY)
Iron: 54 ug/dL (ref 45–182)
Saturation Ratios: 11 % — ABNORMAL LOW (ref 17.9–39.5)
TIBC: 472 ug/dL — ABNORMAL HIGH (ref 250–450)
UIBC: 418 ug/dL — ABNORMAL HIGH (ref 117–376)

## 2021-11-13 LAB — TSH: TSH: 3.363 u[IU]/mL (ref 0.320–4.118)

## 2021-11-13 NOTE — Telephone Encounter (Signed)
? ? ?  Patient Name: Collin Young  ?DOB: 1951/07/04 ?MRN: [id number] ? ?Primary Cardiologist: Skeet Latch, MD ? ?Chart reviewed as part of pre-operative protocol coverage. Patient has EGD scheduled for 12/18/2021 and we were asked to give our recommendations for holding Eliquis. Per Pharmacy, "recommend that patient only hold Eliquis for 1 day prior to procedure given recent PE and resume as soon as safely possible after." ? ?I will route this recommendation to the requesting party via Epic fax function and remove from pre-op pool. ? ?Please call with questions. ? ?Darreld Mclean, PA-C ?11/13/2021, 9:52 AM ? ?

## 2021-11-13 NOTE — Telephone Encounter (Signed)
Per the daughter she believes that the Eliquis has been held since his last PCP visit in Feb.  She will double check and if that is not the case she will ensure that the Eliquis is held 1 day prior to the procedure.  ?

## 2021-11-13 NOTE — Telephone Encounter (Signed)
Per Dr. Marin Olp, I informed the daughter that her dad's iron level is low and needs some IV iron. She verbalized understanding. All appointments need to go through her, Mardee Postin...[phone]. ?

## 2021-11-13 NOTE — Telephone Encounter (Signed)
-----   Message from [name], MD sent at 11/13/2021  1:36 PM EDT ----- ?Call and let him know that the iron level is on the low side.  He probably does need to have some IV iron given.  Please set this up.  Thanks.  Pete ?

## 2021-11-14 ENCOUNTER — Encounter: Payer: Self-pay | Admitting: Hematology & Oncology

## 2021-11-14 DIAGNOSIS — D500 Iron deficiency anemia secondary to blood loss (chronic): Secondary | ICD-10-CM

## 2021-11-14 HISTORY — DX: Iron deficiency anemia secondary to blood loss (chronic): D50.0

## 2021-11-14 NOTE — Addendum Note (Signed)
Addended by: Burney Gauze R on: 11/14/2021 08:10 AM ? ? Modules accepted: Orders ? ?

## 2021-11-21 ENCOUNTER — Other Ambulatory Visit (HOSPITAL_COMMUNITY): Payer: Self-pay

## 2021-11-21 MED ORDER — DAPAGLIFLOZIN PROPANEDIOL 10 MG PO TABS: 10.0000 mg | ORAL_TABLET | Freq: Every day | ORAL | 1 refills | Status: DC

## 2021-11-27 ENCOUNTER — Inpatient Hospital Stay: Payer: Medicare Other

## 2021-11-27 VITALS — BP 136/56 | HR 65 | Temp 98.1°F | Resp 18

## 2021-11-27 DIAGNOSIS — Z79899 Other long term (current) drug therapy: Secondary | ICD-10-CM | POA: Diagnosis not present

## 2021-11-27 DIAGNOSIS — D500 Iron deficiency anemia secondary to blood loss (chronic): Secondary | ICD-10-CM

## 2021-11-27 DIAGNOSIS — Z859 Personal history of malignant neoplasm, unspecified: Secondary | ICD-10-CM | POA: Diagnosis not present

## 2021-11-27 DIAGNOSIS — Z7901 Long term (current) use of anticoagulants: Secondary | ICD-10-CM | POA: Diagnosis not present

## 2021-11-27 MED ORDER — SODIUM CHLORIDE 0.9 % IV SOLN
300.0000 mg | Freq: Once | INTRAVENOUS | Status: AC
  Administered 2021-11-27: 300 mg via INTRAVENOUS
  Filled 2021-11-27: qty 300

## 2021-11-27 MED ORDER — SODIUM CHLORIDE 0.9 % IV SOLN: Freq: Once | INTRAVENOUS | Status: AC

## 2021-11-27 NOTE — Progress Notes (Signed)
Patient declined to stay for the post infusion observation period. Patient denies any difficulty with this infusion in the past and is aware to call with any questions or concerns.   Pt verbalized understanding and had no further questions today.   

## 2021-11-27 NOTE — Patient Instructions (Addendum)

## 2021-12-04 ENCOUNTER — Inpatient Hospital Stay: Payer: Medicare Other | Attending: Hematology & Oncology

## 2021-12-04 VITALS — BP 115/55 | HR 75 | Temp 98.1°F | Resp 18

## 2021-12-04 DIAGNOSIS — D500 Iron deficiency anemia secondary to blood loss (chronic): Secondary | ICD-10-CM

## 2021-12-04 DIAGNOSIS — D509 Iron deficiency anemia, unspecified: Secondary | ICD-10-CM | POA: Insufficient documentation

## 2021-12-04 MED ORDER — SODIUM CHLORIDE 0.9 % IV SOLN
300.0000 mg | Freq: Once | INTRAVENOUS | Status: AC
  Administered 2021-12-04: 300 mg via INTRAVENOUS
  Filled 2021-12-04: qty 300

## 2021-12-04 MED ORDER — SODIUM CHLORIDE 0.9 % IV SOLN: Freq: Once | INTRAVENOUS | Status: AC

## 2021-12-04 NOTE — Patient Instructions (Signed)

## 2021-12-11 ENCOUNTER — Inpatient Hospital Stay: Payer: Medicare Other

## 2021-12-11 ENCOUNTER — Encounter (HOSPITAL_COMMUNITY): Payer: Self-pay | Admitting: Gastroenterology

## 2021-12-11 VITALS — BP 132/60 | HR 79 | Temp 97.7°F | Resp 18

## 2021-12-11 DIAGNOSIS — D500 Iron deficiency anemia secondary to blood loss (chronic): Secondary | ICD-10-CM

## 2021-12-11 DIAGNOSIS — D509 Iron deficiency anemia, unspecified: Secondary | ICD-10-CM | POA: Diagnosis not present

## 2021-12-11 MED ORDER — SODIUM CHLORIDE 0.9 % IV SOLN: Freq: Once | INTRAVENOUS | Status: AC

## 2021-12-11 MED ORDER — SODIUM CHLORIDE 0.9 % IV SOLN
300.0000 mg | Freq: Once | INTRAVENOUS | Status: AC
  Administered 2021-12-11: 300 mg via INTRAVENOUS
  Filled 2021-12-11: qty 300

## 2021-12-11 NOTE — Patient Instructions (Signed)

## 2021-12-17 NOTE — Anesthesia Preprocedure Evaluation (Addendum)
Anesthesia Evaluation  ?Patient identified by MRN, date of birth, ID band ?Patient awake ? ? ? ?Reviewed: ?Allergy & Precautions, NPO status , Patient's Chart, lab work & pertinent test results ? ?History of Anesthesia Complications ?Negative for: history of anesthetic complications ? ?Airway ?Mallampati: II ? ?TM Distance: >3 FB ?Neck ROM: Full ? ? ? Dental ? ?(+) Edentulous Lower, Edentulous Upper ?  ?Pulmonary ?COPD, former smoker,  ?  ?Pulmonary exam normal ? ? ? ? ? ? ? Cardiovascular ?hypertension, Pt. on medications ?+ CAD and +CHF  ?Normal cardiovascular exam ? ?TTE 10/02/21: EF 40-45%, grade I DD, hypokinesis of the left ventricular, septal wall, anteroseptal wall, inferoseptal wall and inferior wall, mild MR/AR ?  ?Neuro/Psych ?negative neurological ROS ? negative psych ROS  ? GI/Hepatic ?Neg liver ROS, GERD  Medicated,  ?Endo/Other  ?negative endocrine ROS ? Renal/GU ?Renal InsufficiencyRenal disease  ?negative genitourinary ?  ?Musculoskeletal ?negative musculoskeletal ROS ?(+)  ? Abdominal ?  ?Peds ? Hematology ?negative hematology ROS ?(+)   ?Anesthesia Other Findings ?Day of surgery medications reviewed with patient. ? Reproductive/Obstetrics ?negative OB ROS ? ?  ? ? ? ? ? ? ? ? ? ? ? ? ? ?  ?  ? ? ? ? ? ? ? ?Anesthesia Physical ?Anesthesia Plan ? ?ASA: 3 ? ?Anesthesia Plan: MAC  ? ?Post-op Pain Management: Minimal or no pain anticipated  ? ?Induction:  ? ?PONV Risk Score and Plan: Treatment may vary due to age or medical condition and Propofol infusion ? ?Airway Management Planned: Natural Airway and Nasal Cannula ? ?Additional Equipment: None ? ?Intra-op Plan:  ? ?Post-operative Plan:  ? ?Informed Consent: I have reviewed the patients History and Physical, chart, labs and discussed the procedure including the risks, benefits and alternatives for the proposed anesthesia with the patient or authorized representative who has indicated his/her understanding and  acceptance.  ? ? ? ? ? ?Plan Discussed with: CRNA ? ?Anesthesia Plan Comments:   ? ? ? ? ? ?Anesthesia Quick Evaluation ? ?

## 2021-12-18 ENCOUNTER — Encounter (HOSPITAL_COMMUNITY): Payer: Self-pay | Admitting: Gastroenterology

## 2021-12-18 ENCOUNTER — Encounter (HOSPITAL_COMMUNITY)
Admission: RE | Disposition: A | Discharge status: Home / Self Care | Payer: Self-pay | Source: Ambulatory Visit | Attending: Gastroenterology

## 2021-12-18 ENCOUNTER — Ambulatory Visit (HOSPITAL_COMMUNITY): Payer: Medicare Other | Admitting: Anesthesiology

## 2021-12-18 ENCOUNTER — Ambulatory Visit (HOSPITAL_COMMUNITY)
Admission: RE | Admit: 2021-12-18 | Discharge: 2021-12-18 | Disposition: A | Discharge status: Home / Self Care | Payer: Medicare Other | Source: Ambulatory Visit | Attending: Gastroenterology | Admitting: Gastroenterology

## 2021-12-18 ENCOUNTER — Ambulatory Visit (HOSPITAL_BASED_OUTPATIENT_CLINIC_OR_DEPARTMENT_OTHER): Payer: Medicare Other | Admitting: Anesthesiology

## 2021-12-18 ENCOUNTER — Other Ambulatory Visit: Payer: Self-pay

## 2021-12-18 DIAGNOSIS — K2970 Gastritis, unspecified, without bleeding: Secondary | ICD-10-CM | POA: Diagnosis not present

## 2021-12-18 DIAGNOSIS — K2971 Gastritis, unspecified, with bleeding: Secondary | ICD-10-CM | POA: Diagnosis not present

## 2021-12-18 DIAGNOSIS — Z87891 Personal history of nicotine dependence: Secondary | ICD-10-CM | POA: Diagnosis not present

## 2021-12-18 DIAGNOSIS — K2940 Chronic atrophic gastritis without bleeding: Secondary | ICD-10-CM | POA: Diagnosis not present

## 2021-12-18 DIAGNOSIS — R1013 Epigastric pain: Secondary | ICD-10-CM | POA: Diagnosis present

## 2021-12-18 DIAGNOSIS — Z7901 Long term (current) use of anticoagulants: Secondary | ICD-10-CM | POA: Insufficient documentation

## 2021-12-18 DIAGNOSIS — Z8501 Personal history of malignant neoplasm of esophagus: Secondary | ICD-10-CM | POA: Insufficient documentation

## 2021-12-18 DIAGNOSIS — I509 Heart failure, unspecified: Secondary | ICD-10-CM | POA: Diagnosis not present

## 2021-12-18 DIAGNOSIS — K449 Diaphragmatic hernia without obstruction or gangrene: Secondary | ICD-10-CM

## 2021-12-18 DIAGNOSIS — I110 Hypertensive heart disease with heart failure: Secondary | ICD-10-CM | POA: Diagnosis not present

## 2021-12-18 DIAGNOSIS — K31A0 Gastric intestinal metaplasia, unspecified: Secondary | ICD-10-CM | POA: Diagnosis not present

## 2021-12-18 DIAGNOSIS — I2510 Atherosclerotic heart disease of native coronary artery without angina pectoris: Secondary | ICD-10-CM | POA: Insufficient documentation

## 2021-12-18 DIAGNOSIS — K219 Gastro-esophageal reflux disease without esophagitis: Secondary | ICD-10-CM | POA: Diagnosis not present

## 2021-12-18 DIAGNOSIS — K921 Melena: Secondary | ICD-10-CM | POA: Insufficient documentation

## 2021-12-18 DIAGNOSIS — J449 Chronic obstructive pulmonary disease, unspecified: Secondary | ICD-10-CM | POA: Diagnosis not present

## 2021-12-18 DIAGNOSIS — R195 Other fecal abnormalities: Secondary | ICD-10-CM

## 2021-12-18 DIAGNOSIS — D649 Anemia, unspecified: Secondary | ICD-10-CM

## 2021-12-18 DIAGNOSIS — K2941 Chronic atrophic gastritis with bleeding: Secondary | ICD-10-CM | POA: Diagnosis not present

## 2021-12-18 HISTORY — PX: ESOPHAGOGASTRODUODENOSCOPY (EGD) WITH PROPOFOL: SHX5813

## 2021-12-18 HISTORY — PX: BIOPSY: SHX5522

## 2021-12-18 SURGERY — ESOPHAGOGASTRODUODENOSCOPY (EGD) WITH PROPOFOL
Anesthesia: Monitor Anesthesia Care

## 2021-12-18 MED ORDER — PROPOFOL 10 MG/ML IV BOLUS
INTRAVENOUS | Status: DC | PRN
  Administered 2021-12-18 (×2): 40 mg via INTRAVENOUS

## 2021-12-18 MED ORDER — LIDOCAINE 2% (20 MG/ML) 5 ML SYRINGE
INTRAMUSCULAR | Status: DC | PRN
  Administered 2021-12-18: 40 mg via INTRAVENOUS

## 2021-12-18 MED ORDER — PROPOFOL 500 MG/50ML IV EMUL
INTRAVENOUS | Status: DC | PRN
  Administered 2021-12-18: 100 ug/kg/min via INTRAVENOUS

## 2021-12-18 MED ORDER — SODIUM CHLORIDE 0.9 % IV SOLN: INTRAVENOUS | Status: DC

## 2021-12-18 MED ORDER — LACTATED RINGERS IV SOLN
INTRAVENOUS | Status: DC
  Administered 2021-12-18: 1000 mL via INTRAVENOUS

## 2021-12-18 SURGICAL SUPPLY — 15 items

## 2021-12-18 NOTE — Anesthesia Procedure Notes (Signed)
Procedure Name: [place] ?Date/Time: 12/18/2021 9:27 AM ?Performed by: Cynda Familia, CRNA ?Pre-anesthesia Checklist: Patient identified, Emergency Drugs available, Suction available, Patient being monitored and Timeout performed ?Patient Re-evaluated:Patient Re-evaluated prior to induction ?Oxygen Delivery Method: Simple face mask ?Placement Confirmation: positive ETCO2 and breath sounds checked- equal and bilateral ?Dental Injury: Teeth and Oropharynx as per pre-operative assessment  ? ? ? ? ?

## 2021-12-18 NOTE — H&P (Signed)
?HPI: ?This is a man with dark stools, on eliquis, Hb normal but heme + ? ? ?ROS: complete GI ROS as described in HPI, all other review negative. ? ?Constitutional:  No unintentional weight loss ? ? ?Past Medical History:  ?Diagnosis Date  ? Chronic combined systolic and diastolic CHF (congestive heart failure) ([place])   ? followed by [name]  ? COPD (chronic obstructive pulmonary disease) ([place])   ? Coronary artery disease cardiologist-- [name]  ? per cardiac cath 01-05-2017  chronic total occlusion pLCx with collaterals, 99% severe calcified prox. to mid RCA, otherwise mild to moderate CAD (medically managed)  ? Esophageal cancer, stage IIIB [facility]) oncologist-  dr ennever/  dr moody  ? dx 2010  SCC Stage IIIB completed chemoradiation;  localized recurrent left piriform sinus 01/ 2015,  completed concurrent chemoradiatoin 04/ 2015  ? GERD (gastroesophageal reflux disease)   ? 09-07-2018   no issues since Gtube removed 06/ 2019  ? History of alcohol abuse   ? quit 2001  ? History of cancer chemotherapy   ? 2010;   2015  ? History of radiation therapy   ? 10-19-2013 to  12-05-2013 pyriform sinus 69.96 Gy/27f;   Radiation completed 2010 for esophageal cancer  ? History of seizure 2001  ? alcohol withdrawal  ? HOH (hard of hearing)   ? Hyperlipidemia   ? Hypertension   ? Iron deficiency anemia due to chronic blood loss 11/14/2021  ? Ischemic cardiomyopathy 12/2016  ? 01-03-2017  echo,  ef 10-15%/   echo 05-06-2017 EF improved to 45-50%  ? Prostate cancer ([facility] urologist-  dr ottelin/  oncologist-- dr mTammi Klippel ? dx 05-27-2018--- Stage T1c,  Gleason 4+3,  PSA 9.3--  scheduled for brachytherapy 09-23-2017  ? Renal cyst, left   ? ? ?Past Surgical History:  ?Procedure Laterality Date  ? CARDIOVASCULAR STRESS TEST  10/09/09  ? normal nuclearr stress test, EF 57% ([name]  ? CENTRAL LINE INSERTION  06/16/2021  ? Procedure: CENTRAL LINE INSERTION;  Surgeon: BJolaine Artist MD;  [facility];  Service: Cardiovascular;;  ? IR GASTROSTOMY TUBE MOD SED  01/13/2017  ? IR GASTROSTOMY TUBE REMOVAL  02/03/2018  ? IR PATIENT EVAL TECH 0-60 MINS  03/25/2017  ? IR REMOVAL TUN ACCESS W/ PORT W/O FL MOD SED  01/15/2017  ? IR REPLACE G-TUBE SIMPLE WO FLUORO  01/13/2018  ? LARYNGOSCOPY N/A 09/15/2013  ? Procedure: LARYNGOSCOPY;  Surgeon: DMelida Quitter MD;  [facility]  Service: ENT;  Laterality: N/A;  direct laryngoscopy with biopsy and esophagoscopy  ? RADIOACTIVE SEED IMPLANT N/A 09/23/2018  ? Procedure: RADIOACTIVE SEED IMPLANT/BRACHYTHERAPY IMPLANT;  Surgeon: OKathie Rhodes MD;  [facility]  Service: Urology;  Laterality: N/A;  ? RIGHT HEART CATH N/A 06/16/2021  ? Procedure: RIGHT HEART CATH;  Surgeon: BJolaine Artist MD;  [facility];  Service: Cardiovascular;  Laterality: N/A;  ? RIGHT/LEFT HEART CATH AND CORONARY ANGIOGRAPHY N/A 01/05/2017  ? Procedure: Right/Left Heart Cath and Coronary Angiography;  Surgeon: MBurnell Blanks MD;  [facility];  Service: Cardiovascular;  Laterality: N/A;  ? TRANSTHORACIC ECHOCARDIOGRAM  05/06/2017  ? ef 45-50%,  grade 1 diastolic dysfunction/  AV severe calcified non coronary cusp with moderate regurg. , no stenosis (valve area per echo 01-05-2017 1.08cm^2)/  mild MR, TR, and PR/  mild LAE  ? ? ?Current Outpatient Medications  ?Medication Instructions  ? albuterol (  VENTOLIN HFA) 108 (90 Base) MCG/ACT inhaler INHALE 2 PUFFS INTO THE LUNGS EVERY 6 HOURS AS NEEDED FOR WHEEZING OR SHORTNESS OF BREATH  ? apixaban (ELIQUIS) 5 mg, Oral, 2 times daily  ? Bayer Low Dose 81 mg, Oral, Daily, Swallow whole.  ? dapagliflozin propanediol (FARXIGA) 10 mg, Oral, Daily  ? digoxin (LANOXIN) 0.125 MG tablet Oral  ? fentaNYL (DURAGESIC) 12 MCG/HR 1 patch, Transdermal, every 72 hours  ? furosemide (LASIX) 40 mg, Oral, Daily  ? hydrALAZINE (APRESOLINE) 37.5 mg, Oral, Every 8 hours  ? ipratropium (ATROVENT) 0.03 % nasal spray 2  sprays, Each Nare, Every 12 hours  ? ivabradine (CORLANOR) 2.5 mg, Oral, 2 times daily with meals  ? Multiple Vitamin (MULTIVITAMIN) tablet 1 tablet, Oral, 3 times weekly  ? pantoprazole (PROTONIX) 40 mg, Oral, Daily  ? polyethylene glycol (MIRALAX / GLYCOLAX) 17 g, Oral, Daily PRN  ? pravastatin (PRAVACHOL) 80 mg, Oral, Every evening  ? Repatha SureClick 093 mg, Subcutaneous, Every 14 days  ? sacubitril-valsartan (ENTRESTO) 49-51 MG 1 tablet, Oral, 2 times daily  ? sildenafil (VIAGRA) 50 mg, Oral, Daily PRN  ? spironolactone (ALDACTONE) 25 mg, Oral, Daily  ? ? ?Allergies as of 10/30/2021  ? (No Known Allergies)  ? ? ?Family History  ?Problem Relation Age of Onset  ? Breast cancer Mother   ? Prostate cancer Neg Hx   ? Colon cancer Neg Hx   ? Pancreatic cancer Neg Hx   ? ? ?Social History  ? ?Socioeconomic History  ? Marital status: Legally Separated  ?  Spouse name: Not on file  ? Number of children: 2  ? Years of education: Not on file  ? Highest education level: Not on file  ?Occupational History  ? Occupation: retired  ?Tobacco Use  ? Smoking status: Former  ?  Packs/day: 1.00  ?  Years: 40.00  ?  Pack years: 40.00  ?  Types: Cigarettes  ?  Start date: 11/08/1960  ?  Quit date: 08/31/2009  ?  Years since quitting: 12.3  ? Smokeless tobacco: Former  ?  Types: Chew  ?  Quit date: 09/01/1999  ?Vaping Use  ? Vaping Use: Never used  ?Substance and Sexual Activity  ? Alcohol use: Not Currently  ?  Alcohol/week: 0.0 standard drinks  ?  Comment: quit in 2001  ? Drug use: No  ?  Comment: back in the day used cocaine,alcohol, marijuana  ? Sexual activity: Yes  ?  Partners: Female  ?  Birth control/protection: None  ?Other Topics Concern  ? Not on file  ?Social History Narrative  ? Not on file  ? ?Social Determinants of Health  ? ?Financial Resource Strain: Low Risk   ? Difficulty of Paying Living Expenses: Not hard at all  ?Food Insecurity: No Food Insecurity  ? Worried About Charity fundraiser in the Last Year: Never true  ?  Ran Out of Food in the Last Year: Never true  ?Transportation Needs: No Transportation Needs  ? Lack of Transportation (Medical): No  ? Lack of Transportation (Non-Medical): No  ?Physical Activity: Insufficiently Active  ? Days of Exercise per Week: 7 days  ? Minutes of Exercise per Session: 20 min  ?Stress: No Stress Concern Present  ? Feeling of Stress : Not at all  ?Social Connections: Moderately Integrated  ? Frequency of Communication with Friends and Family: More than three times a week  ? Frequency of Social Gatherings with Friends and Family: More than three times  a week  ? Attends Religious Services: More than 4 times per year  ? Active Member of Clubs or Organizations: Yes  ? Attends Archivist Meetings: More than 4 times per year  ? Marital Status: Separated  ?Intimate Partner Violence: Not At Risk  ? Fear of Current or Ex-Partner: No  ? Emotionally Abused: No  ? Physically Abused: No  ? Sexually Abused: No  ? ? ? ?Physical Exam: ? ?Constitutional: generally well-appearing ?Psychiatric: alert and oriented x3 ?Abdomen: soft, nontender, nondistended, no obvious ascites, no peritoneal signs, normal bowel sounds ?No peripheral edema noted in lower extremities ? ?Assessment and plan: ?71 y.o. male with dark stools, on eliquis, Hb normal but heme + ? ?Egd today ? ?Please see the "Patient Instructions" section for addition details about the plan. ? ?Owens Loffler, MD ?[facility] ?12/18/2021, 8:47 AM ? ? ?

## 2021-12-18 NOTE — Anesthesia Postprocedure Evaluation (Signed)
Anesthesia Post Note ? ?Patient: Collin Young ? ?Procedure(s) Performed: ESOPHAGOGASTRODUODENOSCOPY (EGD) WITH PROPOFOL ?BIOPSY ? ?  ? ?Patient location during evaluation: PACU ?Anesthesia Type: MAC ?Level of consciousness: awake and alert ?Pain management: pain level controlled ?Vital Signs Assessment: post-procedure vital signs reviewed and stable ?Respiratory status: spontaneous breathing, nonlabored ventilation and respiratory function stable ?Cardiovascular status: blood pressure returned to baseline ?Postop Assessment: no apparent nausea or vomiting ?Anesthetic complications: no ? ? ?No notable events documented. ? ?Last Vitals:  ?Vitals:  ? 12/18/21 1015 12/18/21 1020  ?BP:  (!) 147/67  ?Pulse: 70 64  ?Resp: 17 11  ?Temp:    ?SpO2: 98% 99%  ?  ?Last Pain:  ?Vitals:  ? 12/18/21 0955  ?TempSrc:   ?PainSc: 0-No pain  ? ? ?  ?  ?  ?  ?  ?  ? ?Marthenia Rolling ? ? ? ? ?

## 2021-12-18 NOTE — Transfer of Care (Signed)
Immediate Anesthesia Transfer of Care Note ? ?Patient: [name] ? ?Procedure(s) Performed: ESOPHAGOGASTRODUODENOSCOPY (EGD) WITH PROPOFOL ?BIOPSY ? ?Patient Location: PACU and Endoscopy Unit ? ?Anesthesia Type:MAC ? ?Level of Consciousness: sedated ? ?Airway & Oxygen Therapy: Patient Spontanous Breathing and Patient connected to face mask oxygen ? ?Post-op Assessment: Report given to RN and Post -op Vital signs reviewed and stable ? ?Post vital signs: Reviewed and stable ? ?Last Vitals:  ?Vitals Value Taken Time  ?BP    ?Temp    ?Pulse 68 12/18/21 0953  ?Resp 17 12/18/21 0953  ?SpO2 100 % 12/18/21 0953  ?Vitals shown include unvalidated device data. ? ?Last Pain:  ?Vitals:  ? 12/18/21 0901  ?TempSrc: Temporal  ?PainSc: 0-No pain  ?   ? ?  ? ?Complications: No notable events documented. ?

## 2021-12-18 NOTE — Op Note (Signed)
[facility] ?Patient Name: Collin Young ?Procedure Date: 12/18/2021 ?MRN: [id number] ?Attending MD: Milus Banister , MD ?Date of Birth: 1950/10/10 ?CSN: [id number] ?Age: 71 ?Admit Type: Outpatient ?Procedure:                Upper GI endoscopy ?Indications:              Epigastric abdominal pain, dark stools, not anemic.  ?                          Takes eliquis daily. Remote esophageal squamous  ?                          cancer, diagnosed by [facility] 12-15 years ago,  ?                          treated with chemo and XRT. 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 ?Providers:                Milus Banister, MD, Debi Mays, RN, Janie Billups,  ?                          Technician, Glenis Smoker, CRNA ?Referring MD:              ?Medicines:                Monitored Anesthesia Care ?Complications:            No immediate complications. Estimated blood loss:  ?                          None. ?Estimated Blood Loss:     Estimated blood loss: none. ?Procedure:                Pre-Anesthesia Assessment: ?                          - Prior to the procedure, a History and Physical  ?                          was performed, and patient medications and  ?                          allergies were reviewed. The patient's tolerance of  ?                          previous anesthesia was also reviewed. The risks  ?                          and benefits of the procedure and the sedation  ?                          options and risks were discussed with the patient.  ?  All questions were answered, and informed consent  ?                          was obtained. Prior Anticoagulants: The patient has  ?                          taken no previous anticoagulant or antiplatelet  ?                          agents. ASA Grade Assessment: III - A patient with  ?                          severe systemic disease. After reviewing the risks  ?                          and  benefits, the patient was deemed in  ?                          satisfactory condition to undergo the procedure. ?                          After obtaining informed consent, the endoscope was  ?                          passed under direct vision. Throughout the  ?                          procedure, the patient's blood pressure, pulse, and  ?                          oxygen saturations were monitored continuously. The  ?                          [id number]-[id number] ([id number]) Olympus endoscope was introduced  ?                          through the mouth, and advanced to the second part  ?                          of duodenum. The upper GI endoscopy was  ?                          accomplished without difficulty. The patient  ?                          tolerated the procedure well. ?Scope In: ?Scope Out: ?Findings: ?     Tortuous UES, without strictures. ?     Slightly irregular Z line without nodularity above a small hiatal hernia. ?     Mild inflammation characterized by erythema and friability was found in  ?     the gastric antrum. Biopsies were taken with a cold forceps for  ?     histology. ?     The exam was otherwise without abnormality. ?Impression:               -  Tortuous UES, without strictures. ?                          - Slightly irregular Z line without nodularity  ?                          above a small hiatal hernia. ?                          - Mild,non-specific distal gastritis. Biopsied to  ?                          check for H. pylori. ?                          - The examination was otherwise normal. ?Moderate Sedation: ?     Not Applicable - Patient had care per Anesthesia. ?Recommendation:           - Patient has a contact number available for  ?                          emergencies. The signs and symptoms of potential  ?                          delayed complications were discussed with the  ?                          patient. Return to normal activities tomorrow.  ?                          Written  discharge instructions were provided to the  ?                          patient. ?                          - Resume previous diet. ?                          - Continue present medications. OK to resume your  ?                          blood thinner eliquis today. ?                          - Await pathology results. ?Procedure Code(s):        --- Professional --- ?                          [id number], Esophagogastroduodenoscopy, flexible,  ?                          transoral; with biopsy, single or multiple ?Diagnosis Code(s):        --- Professional --- ?                          K29.70, Gastritis,  unspecified, without bleeding ?                          R10.13, Epigastric pain ?CPT copyright 2019 [employer]. All rights reserved. ?The codes documented in this report are preliminary and upon coder review may  ?be revised to meet current compliance requirements. ?Milus Banister, MD ?12/18/2021 9:45:23 AM ?This report has been signed electronically. ?Number of Addenda: 0 ?

## 2021-12-22 LAB — SURGICAL PATHOLOGY

## 2021-12-24 ENCOUNTER — Other Ambulatory Visit (HOSPITAL_COMMUNITY): Payer: Self-pay | Admitting: Cardiology

## 2021-12-24 MED ORDER — FUROSEMIDE 40 MG PO TABS: 40.0000 mg | ORAL_TABLET | Freq: Every day | ORAL | 3 refills | Status: DC

## 2021-12-29 ENCOUNTER — Other Ambulatory Visit: Payer: Self-pay | Admitting: *Deleted

## 2021-12-29 DIAGNOSIS — C12 Malignant neoplasm of pyriform sinus: Secondary | ICD-10-CM

## 2021-12-29 DIAGNOSIS — C61 Malignant neoplasm of prostate: Secondary | ICD-10-CM

## 2021-12-29 MED ORDER — FENTANYL 12 MCG/HR TD PT72: 1.0000 | MEDICATED_PATCH | TRANSDERMAL | 0 refills | Status: DC

## 2021-12-31 ENCOUNTER — Encounter (HOSPITAL_COMMUNITY): Payer: Self-pay | Admitting: Internal Medicine

## 2021-12-31 ENCOUNTER — Ambulatory Visit (HOSPITAL_COMMUNITY)
Admission: RE | Admit: 2021-12-31 | Discharge: 2021-12-31 | Disposition: A | Discharge status: Home / Self Care | Payer: Medicare Other | Source: Ambulatory Visit | Attending: Internal Medicine | Admitting: Internal Medicine

## 2021-12-31 VITALS — BP 86/58 | HR 81 | Wt 133.0 lb

## 2021-12-31 DIAGNOSIS — I2510 Atherosclerotic heart disease of native coronary artery without angina pectoris: Secondary | ICD-10-CM | POA: Diagnosis not present

## 2021-12-31 DIAGNOSIS — I5040 Unspecified combined systolic (congestive) and diastolic (congestive) heart failure: Secondary | ICD-10-CM

## 2021-12-31 DIAGNOSIS — Z85819 Personal history of malignant neoplasm of unspecified site of lip, oral cavity, and pharynx: Secondary | ICD-10-CM | POA: Insufficient documentation

## 2021-12-31 DIAGNOSIS — Z9221 Personal history of antineoplastic chemotherapy: Secondary | ICD-10-CM | POA: Diagnosis not present

## 2021-12-31 DIAGNOSIS — Z7984 Long term (current) use of oral hypoglycemic drugs: Secondary | ICD-10-CM | POA: Diagnosis not present

## 2021-12-31 DIAGNOSIS — Z79899 Other long term (current) drug therapy: Secondary | ICD-10-CM | POA: Diagnosis not present

## 2021-12-31 DIAGNOSIS — Z923 Personal history of irradiation: Secondary | ICD-10-CM | POA: Diagnosis not present

## 2021-12-31 DIAGNOSIS — Z8546 Personal history of malignant neoplasm of prostate: Secondary | ICD-10-CM | POA: Insufficient documentation

## 2021-12-31 DIAGNOSIS — I420 Dilated cardiomyopathy: Secondary | ICD-10-CM | POA: Diagnosis not present

## 2021-12-31 DIAGNOSIS — Z86711 Personal history of pulmonary embolism: Secondary | ICD-10-CM | POA: Diagnosis not present

## 2021-12-31 DIAGNOSIS — E785 Hyperlipidemia, unspecified: Secondary | ICD-10-CM | POA: Diagnosis not present

## 2021-12-31 DIAGNOSIS — I2699 Other pulmonary embolism without acute cor pulmonale: Secondary | ICD-10-CM | POA: Diagnosis not present

## 2021-12-31 DIAGNOSIS — Z7901 Long term (current) use of anticoagulants: Secondary | ICD-10-CM | POA: Diagnosis not present

## 2021-12-31 DIAGNOSIS — J449 Chronic obstructive pulmonary disease, unspecified: Secondary | ICD-10-CM | POA: Insufficient documentation

## 2021-12-31 DIAGNOSIS — I130 Hypertensive heart and chronic kidney disease with heart failure and stage 1 through stage 4 chronic kidney disease, or unspecified chronic kidney disease: Secondary | ICD-10-CM | POA: Insufficient documentation

## 2021-12-31 DIAGNOSIS — I5022 Chronic systolic (congestive) heart failure: Secondary | ICD-10-CM | POA: Diagnosis not present

## 2021-12-31 DIAGNOSIS — Z8501 Personal history of malignant neoplasm of esophagus: Secondary | ICD-10-CM | POA: Diagnosis not present

## 2021-12-31 DIAGNOSIS — I255 Ischemic cardiomyopathy: Secondary | ICD-10-CM | POA: Diagnosis not present

## 2021-12-31 DIAGNOSIS — N1831 Chronic kidney disease, stage 3a: Secondary | ICD-10-CM | POA: Insufficient documentation

## 2021-12-31 DIAGNOSIS — N183 Chronic kidney disease, stage 3 unspecified: Secondary | ICD-10-CM | POA: Diagnosis not present

## 2021-12-31 LAB — BASIC METABOLIC PANEL
Anion gap: 11 (ref 5–15)
BUN: 33 mg/dL — ABNORMAL HIGH (ref 8–23)
CO2: 29 mmol/L (ref 22–32)
Calcium: 9.7 mg/dL (ref 8.9–10.3)
Chloride: 95 mmol/L — ABNORMAL LOW (ref 98–111)
Creatinine, Ser: 1.55 mg/dL — ABNORMAL HIGH (ref 0.61–1.24)
GFR, Estimated: 48 mL/min — ABNORMAL LOW (ref >60)
Glucose, Bld: 104 mg/dL — ABNORMAL HIGH (ref 70–99)
Potassium: 4.6 mmol/L (ref 3.5–5.1)
Sodium: 135 mmol/L (ref 135–145)

## 2021-12-31 LAB — BRAIN NATRIURETIC PEPTIDE: B Natriuretic Peptide: 485.7 pg/mL — ABNORMAL HIGH (ref 0.0–100.0)

## 2021-12-31 MED ORDER — FUROSEMIDE 40 MG PO TABS: 40.0000 mg | ORAL_TABLET | ORAL | 3 refills | Status: DC | PRN

## 2021-12-31 MED ORDER — ENTRESTO 24-26 MG PO TABS: 1.0000 | ORAL_TABLET | Freq: Two times a day (BID) | ORAL | 3 refills | Status: DC

## 2021-12-31 NOTE — Progress Notes (Signed)
? ?ADVANCED HF CLINIC NOTE ?PCP: Darreld Mclean, MD ?HF Cardiologist: Dr. Haroldine Laws ? ?HPI: ?Collin Young is a 71 y.o.male w/ chronic combined systolic and diastolic CHF/ ischemic CM, severe multivessel CAD treated medically, 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   ?  ?HF dates back to 12/2016. Echo then w/ severely reduced LVEF 10-15%, RV mild-mod reduced, mod AI/MR/TR. LHC showed severe calcific disease in the proximal to mid RCA and chronic total occlusion of the proximal circumflex.There was consideration of RCA PCI with recommendations for medical management.  ?  ?Admitted  9/22 with HF. Echo EF down 25-30%, RV not well visualized. Mod MR/AI. Chest CT showed  large right pleural effusion.  There was questionable PE on his CT but ultimately this was thought to be just artifact with poor opacification. Given w/ IV Lasix and discharged home on Entresto, Farxiga, Toprol XL and PRN lasix.  ?  ?Admitted 06/11/21 with HF and acute hypoxic respiratory failure.Complicated by PE, pleural effusion, and cardiogenic shock. Diuresed with IV lasix.  Limited Echo EF 20%, global HK, RV normal.  RHC cardiogenic shock with R>L heart failure. GDMT titrated. Not felt to be a VAD candidate due to cachexia and RV failure. Discharged home, weight 121 lbs. ?   ?Admitted 07/07/21 with HF. Echo EF 25-30%, severe LV dysfunction, grade I DD, RV mildly reduced.  Diuresed with IV lasix and transitioned to po lasix 40 mg daily. Continued on GDMT, discharge weight 122 lbs.  ?  ?Here for routine f/u. Feels good. 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 Occasional lightheadedness when standing. No CP, SOB, orthopnea or PND.  ? ?Echo 2/23 -EF 40-45% ? ?Cardiac Studies ?Echo 2018 EF 15%.  ?Echo 05/2017 EF improved up to 45-50%, Mod AI. RV normal.   ?Echo 05/2020 EF down again, 35-40%. RV normal  ?Echo 10/22 EF 20%, global HK, RV mildly  reduced ?Echo 11/22 EF 25-30%, global HK, RV mildly reduced. ? ?CMRI - 12/22, unable to quanify EF, LGE with prior infarct basal to mid inferior/inferolateral walls. LGE is greater than 50% transmural suggesting territory is not viable ? ?Past Medical History:  ?Diagnosis Date  ? Chronic combined systolic and diastolic CHF (congestive heart failure) ([place])   ? followed by [name]  ? COPD (chronic obstructive pulmonary disease) ([place])   ? Coronary artery disease cardiologist-- [name]  ? per cardiac cath 01-05-2017  chronic total occlusion pLCx with collaterals, 99% severe calcified prox. to mid RCA, otherwise mild to moderate CAD (medically managed)  ? Esophageal cancer, stage IIIB [facility]) oncologist-  dr ennever/  dr moody  ? dx 2010  SCC Stage IIIB completed chemoradiation;  localized recurrent left piriform sinus 01/ 2015,  completed concurrent chemoradiatoin 04/ 2015  ? GERD (gastroesophageal reflux disease)   ? 09-07-2018   no issues since Gtube removed 06/ 2019  ? History of alcohol abuse   ? quit 2001  ? History of cancer chemotherapy   ? 2010;   2015  ? History of radiation therapy   ? 10-19-2013 to  12-05-2013 pyriform sinus 69.96 Gy/57f;   Radiation completed 2010 for esophageal cancer  ? History of seizure 2001  ? alcohol withdrawal  ? HOH (hard of hearing)   ? Hyperlipidemia   ? Hypertension   ? Iron deficiency anemia due to chronic blood loss 11/14/2021  ? Ischemic cardiomyopathy 12/2016  ? 01-03-2017  echo,  ef 10-15%/   echo 05-06-2017 EF improved to 45-50%  ? Prostate cancer [facility]) urologist-  dr ottelin/  oncologist-- [name]  ? dx 05-27-2018--- Stage T1c,  Gleason 4+3,  PSA 9.3--  scheduled for brachytherapy 09-23-2017  ? Renal cyst, left   ? ?Current Outpatient Medications  ?Medication Sig Dispense Refill  ? albuterol (VENTOLIN HFA) 108 (90 Base) MCG/ACT inhaler INHALE 2 PUFFS INTO THE LUNGS EVERY 6 HOURS AS NEEDED FOR WHEEZING OR SHORTNESS OF BREATH 20.1 g 1  ? apixaban (ELIQUIS)  5 MG TABS tablet Take 1 tablet (5 mg total) by mouth 2 (two) times daily. 60 tablet 6  ? BAYER LOW DOSE 81 MG EC tablet Take 81 mg by mouth daily. Swallow whole.    ? dapagliflozin propanediol (FARXIGA) 10 MG TABS tablet Take 1 tablet (10 mg total) by mouth daily. 90 tablet 1  ? digoxin (LANOXIN) 0.125 MG tablet Take by mouth.    ? Evolocumab (REPATHA SURECLICK) 500 MG/ML SOAJ Inject 140 mg into the skin every 14 (fourteen) days. 2 mL 11  ? fentaNYL (DURAGESIC) 12 MCG/HR Place 1 patch onto the skin every 3 (three) days. 10 patch 0  ? furosemide (LASIX) 40 MG tablet Take 1 tablet (40 mg total) by mouth daily. 30 tablet 3  ? hydrALAZINE (APRESOLINE) 25 MG tablet Take 1.5 tablets (37.5 mg total) by mouth every 8 (eight) hours. 135 tablet 3  ? ipratropium (ATROVENT) 0.03 % nasal spray Place 2 sprays into both nostrils every 12 (twelve) hours. 30 mL 12  ? ivabradine (CORLANOR) 5 MG TABS tablet Take 0.5 tablets (2.5 mg total) by mouth 2 (two) times daily with a meal. 30 tablet 3  ? Multiple Vitamin (MULTIVITAMIN) tablet Take 1 tablet by mouth 3 (three) times a week.    ? pantoprazole (PROTONIX) 40 MG tablet Take 1 tablet (40 mg total) by mouth daily. 30 tablet 3  ? polyethylene glycol (MIRALAX / GLYCOLAX) 17 g packet Take 17 g by mouth daily as needed for mild constipation. 14 each 0  ? pravastatin (PRAVACHOL) 80 MG tablet Take 1 tablet (80 mg total) by mouth every evening. 90 tablet 3  ? sacubitril-valsartan (ENTRESTO) 49-51 MG Take 1 tablet by mouth 2 (two) times daily. 60 tablet 11  ? sildenafil (VIAGRA) 50 MG tablet Take 1 tablet (50 mg total) by mouth daily as needed for erectile dysfunction. 10 tablet 0  ? spironolactone (ALDACTONE) 25 MG tablet Take 1 tablet (25 mg total) by mouth daily. 30 tablet 6  ? ?No current facility-administered medications for this encounter.  ? ?Facility-Administered Medications Ordered in Other Encounters  ?Medication Dose Route Frequency Provider Last Rate Last Admin  ? topical emolient  (BIAFINE) emulsion   Topical Daily Kyung Rudd, MD   Given at 01/19/14 [phone]  ? ?No Known Allergies ? ?Social History  ? ?Socioeconomic History  ? Marital status: Legally Separated  ?  Spouse name: Not on file  ? Number of children: 2  ? Years of education: Not on file  ? Highest education level: Not on file  ?Occupational History  ? Occupation: retired  ?Tobacco Use  ? Smoking status: Former  ?  Packs/day: 1.00  ?  Years: 40.00  ?  Pack years: 40.00  ?  Types: Cigarettes  ?  Start date: 11/08/1960  ?  Quit date: 08/31/2009  ?  Years since quitting: 12.3  ? Smokeless tobacco: Former  ?  Types: Chew  ?  Quit date: 09/01/1999  ?  Vaping Use  ? Vaping Use: Never used  ?Substance and Sexual Activity  ? Alcohol use: Not Currently  ?  Alcohol/week: 0.0 standard drinks  ?  Comment: quit in 2001  ? Drug use: No  ?  Comment: back in the day used cocaine,alcohol, marijuana  ? Sexual activity: Yes  ?  Partners: Female  ?  Birth control/protection: None  ?Other Topics Concern  ? Not on file  ?Social History Narrative  ? Not on file  ? ?Social Determinants of Health  ? ?Financial Resource Strain: Low Risk   ? Difficulty of Paying Living Expenses: Not hard at all  ?Food Insecurity: No Food Insecurity  ? Worried About Charity fundraiser in the Last Year: Never true  ? Ran Out of Food in the Last Year: Never true  ?Transportation Needs: No Transportation Needs  ? Lack of Transportation (Medical): No  ? Lack of Transportation (Non-Medical): No  ?Physical Activity: Insufficiently Active  ? Days of Exercise per Week: 7 days  ? Minutes of Exercise per Session: 20 min  ?Stress: No Stress Concern Present  ? Feeling of Stress : Not at all  ?Social Connections: Moderately Integrated  ? Frequency of Communication with Friends and Family: More than three times a week  ? Frequency of Social Gatherings with Friends and Family: More than three times a week  ? Attends Religious Services: More than 4 times per year  ? Active Member of Clubs or  Organizations: Yes  ? Attends Archivist Meetings: More than 4 times per year  ? Marital Status: Separated  ?Intimate Partner Violence: Not At Risk  ? Fear of Current or Ex-Partner: No  ? Emotionall

## 2021-12-31 NOTE — Patient Instructions (Signed)
Good to see you today! ? ?STOP Digoxin   ? ?Change Lasix to as needed ? ?DECREASE Entresto to 24/26 mg Twice daily ? ?Labs done today, your results will be available in MyChart, we will contact you for abnormal readings. ? ?Please keep a b/p log for 2 weeks and record (send through my chart) ? ?Your physician recommends that you schedule a follow-up appointment in: 6 weeks with app clinic ? ?If you have any questions or concerns before your next appointment please send [place] a message through [place] or call our office at [phone].   ? ?TO LEAVE A MESSAGE FOR THE NURSE SELECT OPTION 2, PLEASE LEAVE A MESSAGE INCLUDING: ?YOUR NAME ?DATE OF BIRTH ?CALL BACK NUMBER ?REASON FOR CALL**this is important as we prioritize the call backs ? ?YOU WILL RECEIVE A CALL BACK THE SAME DAY AS LONG AS YOU CALL BEFORE 4:00 PM ? ?At the [facility], you and your health needs are our priority. As part of our continuing mission to provide you with exceptional heart care, we have created designated Provider Care Teams. These Care Teams include your primary Cardiologist (physician) and Advanced Practice Providers (APPs- Physician Assistants and Nurse Practitioners) who all work together to provide you with the care you need, when you need it.  ? ?You may see any of the following providers on your designated Care Team at your next follow up: ?Dr Glori Bickers ?Dr Loralie Champagne ?Darrick Grinder, NP ?Lyda Jester, PA ?Jessica Milford,NP ?Marlyce Huge, PA ?Audry Riles, PharmD ? ? ?Please be sure to bring in all your medications bottles to every appointment.  ? ? ?

## 2022-01-06 ENCOUNTER — Other Ambulatory Visit: Payer: Self-pay

## 2022-01-06 MED ORDER — REPATHA SURECLICK 140 MG/ML ~~LOC~~ SOAJ: 140.0000 mg | SUBCUTANEOUS | 11 refills | Status: DC

## 2022-01-07 ENCOUNTER — Other Ambulatory Visit: Payer: Self-pay | Admitting: Pharmacist

## 2022-01-07 MED ORDER — REPATHA SURECLICK 140 MG/ML ~~LOC~~ SOAJ: 140.0000 mg | SUBCUTANEOUS | 11 refills | Status: DC

## 2022-01-14 ENCOUNTER — Other Ambulatory Visit (HOSPITAL_COMMUNITY): Payer: Self-pay

## 2022-01-14 MED ORDER — IVABRADINE HCL 5 MG PO TABS: 2.5000 mg | ORAL_TABLET | Freq: Two times a day (BID) | ORAL | 3 refills | Status: DC

## 2022-01-27 ENCOUNTER — Other Ambulatory Visit (HOSPITAL_COMMUNITY): Payer: Self-pay | Admitting: *Deleted

## 2022-01-27 MED ORDER — ENTRESTO 49-51 MG PO TABS: 1.0000 | ORAL_TABLET | Freq: Two times a day (BID) | ORAL | 3 refills | Status: DC

## 2022-02-02 ENCOUNTER — Other Ambulatory Visit: Payer: Self-pay | Admitting: Family Medicine

## 2022-02-02 DIAGNOSIS — R1013 Epigastric pain: Secondary | ICD-10-CM

## 2022-02-02 DIAGNOSIS — R195 Other fecal abnormalities: Secondary | ICD-10-CM

## 2022-02-08 ENCOUNTER — Other Ambulatory Visit: Payer: Self-pay | Admitting: Cardiovascular Disease

## 2022-02-11 ENCOUNTER — Ambulatory Visit (HOSPITAL_COMMUNITY)
Admission: RE | Admit: 2022-02-11 | Discharge: 2022-02-11 | Disposition: A | Discharge status: Home / Self Care | Payer: Medicare Other | Source: Ambulatory Visit | Attending: Family Medicine | Admitting: Family Medicine

## 2022-02-11 ENCOUNTER — Encounter (HOSPITAL_COMMUNITY): Payer: Self-pay

## 2022-02-11 VITALS — BP 86/54 | HR 81 | Wt 136.8 lb

## 2022-02-11 DIAGNOSIS — I130 Hypertensive heart and chronic kidney disease with heart failure and stage 1 through stage 4 chronic kidney disease, or unspecified chronic kidney disease: Secondary | ICD-10-CM | POA: Diagnosis not present

## 2022-02-11 DIAGNOSIS — I2510 Atherosclerotic heart disease of native coronary artery without angina pectoris: Secondary | ICD-10-CM | POA: Insufficient documentation

## 2022-02-11 DIAGNOSIS — Z8501 Personal history of malignant neoplasm of esophagus: Secondary | ICD-10-CM | POA: Diagnosis not present

## 2022-02-11 DIAGNOSIS — I5040 Unspecified combined systolic (congestive) and diastolic (congestive) heart failure: Secondary | ICD-10-CM | POA: Diagnosis not present

## 2022-02-11 DIAGNOSIS — I5032 Chronic diastolic (congestive) heart failure: Secondary | ICD-10-CM | POA: Diagnosis not present

## 2022-02-11 DIAGNOSIS — I2699 Other pulmonary embolism without acute cor pulmonale: Secondary | ICD-10-CM | POA: Diagnosis not present

## 2022-02-11 DIAGNOSIS — Z85819 Personal history of malignant neoplasm of unspecified site of lip, oral cavity, and pharynx: Secondary | ICD-10-CM | POA: Diagnosis not present

## 2022-02-11 DIAGNOSIS — Z8546 Personal history of malignant neoplasm of prostate: Secondary | ICD-10-CM | POA: Insufficient documentation

## 2022-02-11 DIAGNOSIS — Z86711 Personal history of pulmonary embolism: Secondary | ICD-10-CM | POA: Diagnosis not present

## 2022-02-11 DIAGNOSIS — Z8709 Personal history of other diseases of the respiratory system: Secondary | ICD-10-CM

## 2022-02-11 DIAGNOSIS — N1831 Chronic kidney disease, stage 3a: Secondary | ICD-10-CM | POA: Insufficient documentation

## 2022-02-11 DIAGNOSIS — Z79899 Other long term (current) drug therapy: Secondary | ICD-10-CM | POA: Diagnosis not present

## 2022-02-11 DIAGNOSIS — Z923 Personal history of irradiation: Secondary | ICD-10-CM | POA: Diagnosis not present

## 2022-02-11 DIAGNOSIS — N183 Chronic kidney disease, stage 3 unspecified: Secondary | ICD-10-CM

## 2022-02-11 DIAGNOSIS — Z7901 Long term (current) use of anticoagulants: Secondary | ICD-10-CM | POA: Diagnosis not present

## 2022-02-11 DIAGNOSIS — R64 Cachexia: Secondary | ICD-10-CM | POA: Insufficient documentation

## 2022-02-11 DIAGNOSIS — E785 Hyperlipidemia, unspecified: Secondary | ICD-10-CM | POA: Insufficient documentation

## 2022-02-11 DIAGNOSIS — Z9221 Personal history of antineoplastic chemotherapy: Secondary | ICD-10-CM | POA: Diagnosis not present

## 2022-02-11 DIAGNOSIS — J449 Chronic obstructive pulmonary disease, unspecified: Secondary | ICD-10-CM | POA: Insufficient documentation

## 2022-02-11 DIAGNOSIS — Z7984 Long term (current) use of oral hypoglycemic drugs: Secondary | ICD-10-CM | POA: Diagnosis not present

## 2022-02-11 DIAGNOSIS — I255 Ischemic cardiomyopathy: Secondary | ICD-10-CM | POA: Insufficient documentation

## 2022-02-11 LAB — BASIC METABOLIC PANEL
Anion gap: 8 (ref 5–15)
BUN: 21 mg/dL (ref 8–23)
CO2: 26 mmol/L (ref 22–32)
Calcium: 9.3 mg/dL (ref 8.9–10.3)
Chloride: 99 mmol/L (ref 98–111)
Creatinine, Ser: 1.23 mg/dL (ref 0.61–1.24)
GFR, Estimated: >60 mL/min (ref >60)
Glucose, Bld: 88 mg/dL (ref 70–99)
Potassium: 4.2 mmol/L (ref 3.5–5.1)
Sodium: 133 mmol/L — ABNORMAL LOW (ref 135–145)

## 2022-02-11 MED ORDER — HYDRALAZINE HCL 25 MG PO TABS: 12.5000 mg | ORAL_TABLET | Freq: Three times a day (TID) | ORAL | 11 refills | Status: DC

## 2022-02-11 MED ORDER — ENTRESTO 24-26 MG PO TABS: 1.0000 | ORAL_TABLET | Freq: Two times a day (BID) | ORAL | 11 refills | Status: DC

## 2022-02-11 NOTE — Patient Instructions (Signed)
DECREASE Hydralazine to 12.5 mg (one half) tab three times day DECREASE Entresto to 24/26 mg one tab twice a day  Labs today We will only contact you if something comes back abnormal or we need to make some changes. Otherwise no news is good news!  Your physician recommends that you schedule a follow-up appointment in: 3-4 months with Dr Haroldine Laws  Do the following things EVERYDAY: Weigh yourself in the morning before breakfast. Write it down and keep it in a log. Take your medicines as prescribed Eat low salt foods--Limit salt (sodium) to 2000 mg per day.  Stay as active as you can everyday Limit all fluids for the day to less than 2 liters  At the [facility], you and your health needs are our priority. As part of our continuing mission to provide you with exceptional heart care, we have created designated Provider Care Teams. These Care Teams include your primary Cardiologist (physician) and Advanced Practice Providers (APPs- Physician Assistants and Nurse Practitioners) who all work together to provide you with the care you need, when you need it.   You may see any of the following providers on your designated Care Team at your next follow up: Dr Glori Bickers Dr Haynes Kerns, NP Lyda Jester, [place] [facility] [place], [place] Audry Riles, PharmD   Please be sure to bring in all your medications bottles to every appointment.   If you have any questions or concerns before your next appointment please send [place] a message through [place] or call our office at [phone].    TO LEAVE A MESSAGE FOR THE NURSE SELECT OPTION 2, PLEASE LEAVE A MESSAGE INCLUDING: YOUR NAME DATE OF BIRTH CALL BACK NUMBER REASON FOR CALL**this is important as we prioritize the call backs  YOU WILL RECEIVE A CALL BACK THE SAME DAY AS LONG AS YOU CALL BEFORE 4:00 PM

## 2022-02-11 NOTE — Progress Notes (Signed)
ADVANCED HF CLINIC NOTE PCP: Copland, Gay Filler, MD HF Cardiologist: Dr. Haroldine Laws  HPI: Collin Young is a 71 y.o.male w/ chronic combined systolic and diastolic CHF/ ischemic CM, severe multivessel CAD treated medically, 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     HF dates back to 12/2016. Echo then w/ severely reduced LVEF 10-15%, RV mild-mod reduced, mod AI/MR/TR. LHC showed severe calcific disease in the proximal to mid RCA and chronic total occlusion of the proximal circumflex.There was consideration of RCA PCI with recommendations for medical management.    Admitted  9/22 with HF. Echo EF down 25-30%, RV not well visualized. Mod MR/AI. Chest CT showed  large right pleural effusion.  There was questionable PE on his CT but ultimately this was thought to be just artifact with poor opacification. Given w/ IV Lasix and discharged home on Entresto, Farxiga, Toprol XL and PRN lasix.    Admitted 06/11/21 with HF and acute hypoxic respiratory failure.Complicated by PE, pleural effusion, and cardiogenic shock. Diuresed with IV lasix.  Limited Echo EF 20%, global HK, RV normal.  RHC cardiogenic shock with R>L heart failure. GDMT titrated. Not felt to be a VAD candidate due to cachexia and RV failure. Discharged home, weight 121 lbs.    Admitted 07/07/21 with HF. Echo EF 25-30%, severe LV dysfunction, grade I DD, RV mildly reduced.  Diuresed with IV lasix and transitioned to po lasix 40 mg daily. Continued on GDMT, discharge weight 122 lbs.   Echo (2/23): EF 40-45%  Follow up 5/23, NYHA II, volume low. Lasix changed to PRN and Entresto decreased to 24/26.  Today she returns for HF follow up. Overall feeling fine. 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 Denies increasing SOB, CP, dizziness, edema, or PND/Orthopnea. Appetite ok. No fever or chills. Weight at home 170 pounds.  Taking all medications, has been splitting Entresto 49/51 tablets and continued to take higher dose of hydralazine, despite recs to decrease. Daughter helps fill his pill box.    Cardiac Studies Echo 2018 EF 15%.  Echo 05/2017 EF improved up to 45-50%, Mod AI. RV normal.   Echo 05/2020 EF down again, 35-40%. RV normal  Echo 10/22 EF 20%, global HK, RV mildly reduced Echo 11/22 EF 25-30%, global HK, RV mildly reduced. Echo 2/23: EF 40-45%  CMRI (12/22,) unable to quanify EF, LGE with prior infarct basal to mid inferior/inferolateral walls. LGE is greater than 50% transmural suggesting territory is not viable  Past Medical History:  Diagnosis Date   Chronic combined systolic and diastolic CHF (congestive heart failure) ([place])    followed by [name]   COPD (chronic obstructive pulmonary disease) ([place])    Coronary artery disease cardiologist-- [name]   per cardiac cath 01-05-2017  chronic total occlusion pLCx with collaterals, 99% severe calcified prox. to mid RCA, otherwise mild to moderate CAD (medically managed)   Esophageal cancer, stage IIIB [facility]) oncologist-  dr ennever/  dr moody   dx 2010  SCC Stage IIIB completed chemoradiation;  localized recurrent left piriform sinus 01/ 2015,  completed concurrent chemoradiatoin 04/ 2015   GERD (gastroesophageal reflux disease)    09-07-2018   no issues since Gtube removed 06/ 2019   History of alcohol abuse    quit 2001   History of cancer chemotherapy    2010;   2015   History of radiation  therapy    10-19-2013 to  12-05-2013 pyriform sinus 69.96 Gy/57f;   Radiation completed 2010 for esophageal cancer   History of seizure 2001   alcohol withdrawal   HOH (hard of hearing)    Hyperlipidemia    Hypertension    Iron deficiency anemia due to chronic blood loss 11/14/2021   Ischemic cardiomyopathy 12/2016   01-03-2017  echo,  ef 10-15%/   echo 05-06-2017 EF improved to 45-50%   Prostate cancer ([facility] urologist-  dr ottelin/   oncologist-- dr mTammi Klippel  dx 05-27-2018--- Stage T1c,  Gleason 4+3,  PSA 9.3--  scheduled for brachytherapy 09-23-2017   Renal cyst, left    Current Outpatient Medications  Medication Sig Dispense Refill   albuterol (VENTOLIN HFA) 108 (90 Base) MCG/ACT inhaler INHALE 2 PUFFS INTO THE LUNGS EVERY 6 HOURS AS NEEDED FOR WHEEZING OR SHORTNESS OF BREATH 20.1 g 1   apixaban (ELIQUIS) 5 MG TABS tablet Take 1 tablet (5 mg total) by mouth 2 (two) times daily. 60 tablet 6   BAYER LOW DOSE 81 MG EC tablet Take 81 mg by mouth daily. Swallow whole.     dapagliflozin propanediol (FARXIGA) 10 MG TABS tablet Take 1 tablet (10 mg total) by mouth daily. 90 tablet 1   Evolocumab (REPATHA SURECLICK) 1034MG/ML SOAJ Inject 140 mg into the skin every 14 (fourteen) days. 2 mL 11   fentaNYL (DURAGESIC) 12 MCG/HR Place 1 patch onto the skin every 3 (three) days. 10 patch 0   furosemide (LASIX) 40 MG tablet Take 1 tablet (40 mg total) by mouth as needed. 30 tablet 3   hydrALAZINE (APRESOLINE) 25 MG tablet Patient takes 1.5  tablets by mouth every eight hours.     ipratropium (ATROVENT) 0.03 % nasal spray Place 2 sprays into both nostrils every 12 (twelve) hours. (Patient taking differently: Place 2 sprays into both nostrils every 12 (twelve) hours. As needed) 30 mL 12   ivabradine (CORLANOR) 5 MG TABS tablet Take 0.5 tablets (2.5 mg total) by mouth 2 (two) times daily with a meal. 30 tablet 3   Multiple Vitamin (MULTIVITAMIN) tablet Take 1 tablet by mouth 3 (three) times a week.     pantoprazole (PROTONIX) 40 MG tablet TAKE 1 TABLET(40 MG) BY MOUTH DAILY 30 tablet 3   polyethylene glycol (MIRALAX / GLYCOLAX) 17 g packet Take 17 g by mouth daily as needed for mild constipation. 14 each 0   pravastatin (PRAVACHOL) 80 MG tablet Take 1 tablet (80 mg total) by mouth every evening. 90 tablet 3   sacubitril-valsartan (ENTRESTO) 49-51 MG Take 1 tablet by mouth 2 (two) times daily. 60 tablet 3   sildenafil (VIAGRA) 50 MG tablet  Take 1 tablet (50 mg total) by mouth daily as needed for erectile dysfunction. 10 tablet 0   spironolactone (ALDACTONE) 25 MG tablet Take 1 tablet (25 mg total) by mouth daily. 30 tablet 6   No current facility-administered medications for this encounter.   Facility-Administered Medications Ordered in Other Encounters  Medication Dose Route Frequency Provider Last Rate Last Admin   topical emolient (BIAFINE) emulsion   Topical Daily MKyung Rudd MD   Given at 01/19/14 0912   No Known Allergies  Social History   Socioeconomic History   Marital status: Legally Separated    Spouse name: Not on file   Number of children: 2   Years of education: Not on file   Highest education level: Not on file  Occupational History   Occupation: retired  Tobacco Use   Smoking status: Former    Packs/day: 1.00    Years: 40.00    Total pack years: 40.00    Types: Cigarettes    Start date: 11/08/1960    Quit date: 08/31/2009    Years since quitting: 12.4   Smokeless tobacco: Former    Types: Chew    Quit date: 09/01/1999  Vaping Use   Vaping Use: Never used  Substance and Sexual Activity   Alcohol use: Not Currently    Alcohol/week: 0.0 standard drinks of alcohol    Comment: quit in 2001   Drug use: No    Comment: back in the day used cocaine,alcohol, marijuana   Sexual activity: Yes    Partners: Female    Birth control/protection: None  Other Topics Concern   Not on file  Social History Narrative   Not on file   Social Determinants of Health   Financial Resource Strain: Low Risk  (08/29/2021)   Overall Financial Resource Strain (CARDIA)    Difficulty of Paying Living Expenses: Not hard at all  Food Insecurity: No Food Insecurity (08/29/2021)   Hunger Vital Sign    Worried About Running Out of Food in the Last Year: Never true    Ran Out of Food in the Last Year: Never true  Transportation Needs: No Transportation Needs (07/17/2021)   PRAPARE - Hydrologist  (Medical): No    Lack of Transportation (Non-Medical): No  Physical Activity: Insufficiently Active (08/29/2021)   Exercise Vital Sign    Days of Exercise per Week: 7 days    Minutes of Exercise per Session: 20 min  Stress: No Stress Concern Present (08/29/2021)   [place]    Feeling of Stress : Not at all  Social Connections: Moderately Integrated (08/29/2021)   Social Connection and Isolation Panel [NHANES]    Frequency of Communication with Friends and Family: More than three times a week    Frequency of Social Gatherings with Friends and Family: More than three times a week    Attends Religious Services: More than 4 times per year    Active Member of [employer] or Organizations: Yes    Attends Archivist Meetings: More than 4 times per year    Marital Status: Separated  Intimate Partner Violence: Not At Risk (08/29/2021)   Humiliation, Afraid, Rape, and Kick questionnaire    Fear of Current or Ex-Partner: No    Emotionally Abused: No    Physically Abused: No    Sexually Abused: No   Family History  Problem Relation Age of Onset   Breast cancer Mother    Prostate cancer Neg Hx    Colon cancer Neg Hx    Pancreatic cancer Neg Hx    Pulse 81   Wt 62.1 kg (136 lb 12.8 oz)   SpO2 97%   BMI 22.08 kg/m   Wt Readings from Last 3 Encounters:  02/11/22 62.1 kg (136 lb 12.8 oz)  12/31/21 60.3 kg (133 lb)  12/18/21 59 kg (130 lb 1.1 oz)   PHYSICAL EXAM: General:  NAD. No resp difficulty HEENT: Hoarse Neck: Supple. No JVD. Carotids 2+ bilat; no bruits. No lymphadenopathy or thryomegaly appreciated. Cor: PMI nondisplaced. Regular rate & rhythm. No rubs, gallops or murmurs. Lungs: Clear Abdomen: Soft, nontender, nondistended. No hepatosplenomegaly. No bruits or masses. Good bowel sounds. Extremities: No cyanosis, clubbing, rash, edema Neuro: Alert & oriented x 3, cranial nerves  grossly intact. Moves all 4  extremities w/o difficulty. Affect pleasant.  ASSESSMENT & PLAN:  1.  Chronic Systolic Heart Failure  - Echo (2018): EF 10-15%, RV moderately reduced. LHC w/ MVCAD management medically - Echo (9/21): EF down again, 35-40%. RV normal  - Echo (9/22): EF down 25-30%, RV not well visualized. Mod MR and Mod AI. - Limited Echo 10/22: EF 20%, RV normal  - RHC (06/16/21): w/ R>L HF and low output, CI 1.8. PAPi 1.3. - Echo (11/22): EF 25-30%, global HK, RV mildly reduced. - cMRI (12/22): unable to quanify EF, LGE with prior infarct basal to mid inferior/inferolateral walls. LGE is greater than 50% transmural suggesting territory is not viable. - Echo (2/23): EF improved 40-45%.  - Stable NYHA II. Volume status ok on Lasix 40 mg PRN.  - BP low. Decrease hydralazine to 12.5 mg bid (No Imdur with sildenafil) - Continue ivabradine 2.5 mg bid.  - Continue Entresto 24/26 mg bid (send in Rx for 24/26, he has been taking 1/2 tab of 49/51). - Continue spiro 25 mg daily. - Continue Farxiga 10 mg daily. - Now off digoxin with improving EF.   - Not felt to be VAD candidate due to cachexia and RV failure.  - Labs today. I personally called his daughter and discussed med changes.    2. Pulmonary Embolism  - Diagnosed 10/22. - Limited echo w/ no RV strain  - Suspect incidental due HF. - Continue Eliquis 5 bid   3. CKD Stage IIIa - BMET today.   4. H/o Pleural Effusion, right - s/p thoracentesis by 06/11/21 - no recurrence.   5. H/o esophageal CA and throat CA - s/p chemo and XRT.  Follow up in 3-4 months with Dr. Haroldine Laws.  Rafael Bihari, FNP  2:19 PM

## 2022-02-13 ENCOUNTER — Encounter (HOSPITAL_COMMUNITY): Payer: Self-pay | Admitting: Internal Medicine

## 2022-02-14 NOTE — Progress Notes (Deleted)
[place] at [facility] [address], [place], [place] [address] [phone]  Date:  02/16/2022   Name:  Collin Young   DOB:  [birth year]   MRN:  [id number]  PCP:  Darreld Mclean, MD    Chief Complaint: No chief complaint on file.   History of Present Illness:  Collin Young is a 71 y.o. 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  Chronic Systolic Heart Failure  - Echo (2018): EF 10-15%, RV moderately reduced. LHC w/ MVCAD management medically - Echo (9/21): EF down again, 35-40%. RV normal  - Echo (9/22): EF down 25-30%, RV not well visualized. Mod MR and Mod AI. - Limited Echo 10/22: EF 20%, RV normal  - RHC (06/16/21): w/ R>L HF and low output, CI 1.8. PAPi 1.3. - Echo (11/22): EF 25-30%, global HK, RV mildly reduced. - cMRI (12/22): unable to quanify EF, LGE with prior infarct basal to mid inferior/inferolateral walls. LGE is greater than 50% transmural suggesting territory is not viable. - Echo (2/23): EF improved 40-45%.  - Stable NYHA II. Volume status ok on Lasix 40 mg PRN.  - BP low. Decrease hydralazine to 12.5 mg bid (No Imdur with sildenafil) - Continue ivabradine 2.5 mg bid.  - Continue Entresto 24/26 mg bid (send in Rx for 24/26, he has been taking 1/2 tab of 49/51). - Continue spiro 25 mg daily. - Continue Farxiga 10 mg daily. - Now off digoxin with improving EF.   - Not  felt to be VAD candidate due to cachexia and RV failure.  - Labs today. I personally called his daughter and discussed med changes.  2. Pulmonary Embolism  - Diagnosed 10/22. - Limited echo w/ no RV strain  - Suspect incidental due HF. - Continue Eliquis 5 bid 3. CKD Stage IIIa - BMET today. 4. H/o Pleural Effusion, right - s/p thoracentesis by 06/11/21 - no recurrence. 5. H/o esophageal CA and throat CA - s/p chemo and XRT.   Patient Active Problem List   Diagnosis Date Noted   Iron deficiency anemia due to chronic blood loss 11/14/2021   Acute on chronic systolic CHF (congestive heart failure) ([place]) 07/07/2021   COPD (chronic obstructive pulmonary disease) (HCC)    Hyponatremia    SOB (shortness of breath)    DNR (do not resuscitate)    Weakness generalized    Elevated LFTs    Pulmonary embolism ([place]) 06/11/2021   Acute on chronic heart failure ([employer]) 06/02/2021   GERD without esophagitis 05/08/2021   Mixed hyperlipidemia 05/08/2021   Hypertensive urgency 05/08/2021   Elevated troponin level not due myocardial infarction 05/08/2021   Pure hypercholesterolemia 09/23/2020   Claudication in peripheral vascular disease ([place]) 01/25/2020   Late latent syphilis 01/15/2020   Coronary artery disease involving native coronary artery of native heart 07/05/2019   Chronic kidney disease, stage 3a ([place]) 07/05/2019  Combined systolic and diastolic heart failure ([place]) 09/26/2018   Malignant neoplasm of prostate ([place]) 06/17/2018   Medication management    Dysphagia    Palliative care by specialist    Ischemic cardiomyopathy    Congestive dilated cardiomyopathy ([place])    Acute on chronic systolic congestive heart failure ([place]) 01/02/2017   Malignant neoplasm of pyriform sinus ([place]) 09/28/2013   Piriform sinus tumor 09/24/2013   NONSPECIFIC ABN FINDING RAD & OTH EXAM GI TRACT 05/14/2009    Past Medical History:  Diagnosis Date   Chronic combined systolic and diastolic CHF  (congestive heart failure) ([place])    followed by [name]   COPD (chronic obstructive pulmonary disease) ([place])    Coronary artery disease cardiologist-- [name]   per cardiac cath 01-05-2017  chronic total occlusion pLCx with collaterals, 99% severe calcified prox. to mid RCA, otherwise mild to moderate CAD (medically managed)   Esophageal cancer, stage IIIB [facility]) oncologist-  dr ennever/  dr moody   dx 2010  SCC Stage IIIB completed chemoradiation;  localized recurrent left piriform sinus 01/ 2015,  completed concurrent chemoradiatoin 04/ 2015   GERD (gastroesophageal reflux disease)    09-07-2018   no issues since Gtube removed 06/ 2019   History of alcohol abuse    quit 2001   History of cancer chemotherapy    2010;   2015   History of radiation therapy    10-19-2013 to  12-05-2013 pyriform sinus 69.96 Gy/23f;   Radiation completed 2010 for esophageal cancer   History of seizure 2001   alcohol withdrawal   HOH (hard of hearing)    Hyperlipidemia    Hypertension    Iron deficiency anemia due to chronic blood loss 11/14/2021   Ischemic cardiomyopathy 12/2016   01-03-2017  echo,  ef 10-15%/   echo 05-06-2017 EF improved to 45-50%   Prostate cancer ([facility] urologist-  dr ottelin/  oncologist-- dr mTammi Klippel  dx 05-27-2018--- Stage T1c,  Gleason 4+3,  PSA 9.3--  scheduled for brachytherapy 09-23-2017   Renal cyst, left     Past Surgical History:  Procedure Laterality Date   BIOPSY  12/18/2021   Procedure: BIOPSY;  Surgeon: JMilus Banister MD;  [facility];  Service: Gastroenterology;;   CARDIOVASCULAR STRESS TEST  10/09/09   normal nuclearr stress test, EF 57% ([name]   CENTRAL LINE INSERTION  06/16/2021   Procedure: CENTRAL LINE INSERTION;  Surgeon: BJolaine Artist MD;  [facility];  Service: Cardiovascular;;   ESOPHAGOGASTRODUODENOSCOPY (EGD) WITH PROPOFOL N/A 12/18/2021   Procedure: ESOPHAGOGASTRODUODENOSCOPY (EGD) WITH PROPOFOL;   Surgeon: JMilus Banister MD;  [facility];  Service: Gastroenterology;  Laterality: N/A;   IR GASTROSTOMY TUBE MOD SED  01/13/2017   IR GASTROSTOMY TUBE REMOVAL  02/03/2018   IR PATIENT EVAL TECH 0-60 MINS  03/25/2017   IR REMOVAL TUN ACCESS W/ PORT W/O FL MOD SED  01/15/2017   IR REPLACE G-TUBE SIMPLE WO FLUORO  01/13/2018   LARYNGOSCOPY N/A 09/15/2013   Procedure: LARYNGOSCOPY;  Surgeon: DMelida Quitter MD;  [facility]  Service: ENT;  Laterality: N/A;  direct laryngoscopy with biopsy and esophagoscopy   RADIOACTIVE SEED IMPLANT N/A 09/23/2018   Procedure: RADIOACTIVE SEED IMPLANT/BRACHYTHERAPY IMPLANT;  Surgeon: OKathie Rhodes MD;  [facility]  Service: Urology;  Laterality: N/A;   RIGHT HEART CATH N/A 06/16/2021   Procedure: RIGHT HEART CATH;  Surgeon: BJolaine Artist MD;  [facility];  Service: Cardiovascular;  Laterality: N/A;   RIGHT/LEFT HEART CATH AND CORONARY ANGIOGRAPHY N/A 01/05/2017   Procedure: Right/Left Heart Cath and Coronary Angiography;  Surgeon: [name], MD;  Location: [place] [facility];  Service: Cardiovascular;  Laterality: N/A;   TRANSTHORACIC ECHOCARDIOGRAM  05/06/2017   ef 45-50%,  grade 1 diastolic dysfunction/  AV severe calcified non coronary cusp with moderate regurg. , no stenosis (valve area per echo 01-05-2017 1.08cm^2)/  mild MR, TR, and PR/  mild LAE    Social History   Tobacco Use   Smoking status: Former    Packs/day: 1.00    Years: 40.00    Total pack years: 40.00    Types: Cigarettes    Start date: 11/08/1960    Quit date: 08/31/2009    Years since quitting: 12.4   Smokeless tobacco: Former    Types: Chew    Quit date: 09/01/1999  Vaping Use   Vaping Use: Never used  Substance Use Topics   Alcohol use: Not Currently    Alcohol/week: 0.0 standard drinks of alcohol    Comment: quit in 2001   Drug use: No    Comment: back in the day used cocaine,alcohol, marijuana    Family  History  Problem Relation Age of Onset   Breast cancer Mother    Prostate cancer Neg Hx    Colon cancer Neg Hx    Pancreatic cancer Neg Hx     No Known Allergies  Medication list has been reviewed and updated.  Current Outpatient Medications on File Prior to Visit  Medication Sig Dispense Refill   albuterol (VENTOLIN HFA) 108 (90 Base) MCG/ACT inhaler INHALE 2 PUFFS INTO THE LUNGS EVERY 6 HOURS AS NEEDED FOR WHEEZING OR SHORTNESS OF BREATH 20.1 g 1   apixaban (ELIQUIS) 5 MG TABS tablet Take 1 tablet (5 mg total) by mouth 2 (two) times daily. 60 tablet 6   BAYER LOW DOSE 81 MG EC tablet Take 81 mg by mouth daily. Swallow whole.     dapagliflozin propanediol (FARXIGA) 10 MG TABS tablet Take 1 tablet (10 mg total) by mouth daily. 90 tablet 1   Evolocumab (REPATHA SURECLICK) 939 MG/ML SOAJ Inject 140 mg into the skin every 14 (fourteen) days. 2 mL 11   fentaNYL (DURAGESIC) 12 MCG/HR Place 1 patch onto the skin every 3 (three) days. 10 patch 0   furosemide (LASIX) 40 MG tablet Take 1 tablet (40 mg total) by mouth as needed. 30 tablet 3   hydrALAZINE (APRESOLINE) 25 MG tablet Take 0.5 tablets (12.5 mg total) by mouth 3 (three) times daily. 42 tablet 11   ipratropium (ATROVENT) 0.03 % nasal spray Place 2 sprays into both nostrils every 12 (twelve) hours. (Patient taking differently: Place 2 sprays into both nostrils every 12 (twelve) hours. As needed) 30 mL 12   ivabradine (CORLANOR) 5 MG TABS tablet Take 0.5 tablets (2.5 mg total) by mouth 2 (two) times daily with a meal. 30 tablet 3   Multiple Vitamin (MULTIVITAMIN) tablet Take 1 tablet by mouth 3 (three) times a week.     pantoprazole (PROTONIX) 40 MG tablet TAKE 1 TABLET(40 MG) BY MOUTH DAILY 30 tablet 3   polyethylene glycol (MIRALAX / GLYCOLAX) 17 g packet Take 17 g by mouth daily as needed for mild constipation. 14 each 0   pravastatin (PRAVACHOL) 80 MG tablet Take 1 tablet (80 mg total) by mouth every evening. 90 tablet 3    sacubitril-valsartan (ENTRESTO) 24-26  MG Take 1 tablet by mouth 2 (two) times daily. 60 tablet 11   sildenafil (VIAGRA) 50 MG tablet Take 1 tablet (50 mg total) by mouth daily as needed for erectile dysfunction. 10 tablet 0   spironolactone (ALDACTONE) 25 MG tablet Take 1 tablet (25 mg total) by mouth daily. 30 tablet 6   Current Facility-Administered Medications on File Prior to Visit  Medication Dose Route Frequency Provider Last Rate Last Admin   topical emolient (BIAFINE) emulsion   Topical Daily Kyung Rudd, MD   Given at 01/19/14 6195    Review of Systems:  As per HPI- otherwise negative.   Physical Examination: There were no vitals filed for this visit. There were no vitals filed for this visit. There is no height or weight on file to calculate BMI. Ideal Body Weight:    GEN: no acute distress. HEENT: Atraumatic, Normocephalic.  Ears and Nose: No external deformity. CV: RRR, No M/G/R. No JVD. No thrill. No extra heart sounds. PULM: CTA B, no wheezes, crackles, rhonchi. No retractions. No resp. distress. No accessory muscle use. ABD: S, NT, ND, +BS. No rebound. No HSM. EXTR: No c/c/e PSYCH: Normally interactive. Conversant.    Assessment and Plan: ***  Signed Lamar Blinks, MD

## 2022-02-16 ENCOUNTER — Ambulatory Visit: Payer: Medicare Other | Admitting: Family Medicine

## 2022-02-22 NOTE — Progress Notes (Signed)
[place] at [facility] [address], [place], [place] [address] [phone]  Date:  02/25/2022   Name:  Collin Young   DOB:  [birth year]   MRN:  [id number]  PCP:  Darreld Mclean, MD    Chief Complaint: Right thumb (Right thumb pain. Pt says it feels like it's out place. Limited ROM. X 1 month. Has used a brace and some tape. )   History of Present Illness:  Collin Young is a 71 y.o. very pleasant male patient who presents with the following:  Patient seen today with concern regarding his right thumb Medically complex patient-seen in heart failure clinic on 5/3 and 6/14-most recent EF has improved to 40-45% He is also currently taking Eliquis for a pulmonary embolism History of esophageal and throat cancer, status post chemo and radiation- no current treatment  Can give pneumonia booster- give today  Tetanus appears to be due- recommended at pharmacy  He notes pain and popping/ getting stuck in his right thumb at IP joint He is not aware of any injury He has tried wearing a brace that he has OTC but it did not help Never had this in the past    DG Hand Complete Right  Result Date: 02/25/2022 CLINICAL DATA:  Chronic thumb pain.  No known injury EXAM: RIGHT HAND - COMPLETE 3+ VIEW COMPARISON:  None Available. FINDINGS: Negative for acute fracture.  No significant arthropathy 1 x 3 mm soft tissue calcification adjacent to the distal aspect of the first proximal phalanx. This may be due to chronic avulsion fracture or ligament injury. Deformity of the tuft of the third distal phalanx likely due to chronic healed fracture. IMPRESSION: No acute fracture Soft tissue calcification at the first interphalangeal joint likely due to prior avulsion fracture or ligament injury. Electronically Signed   By: Franchot Gallo M.D.   On: 02/25/2022 12:54    Patient Active Problem List   Diagnosis Date Noted   Iron deficiency anemia due to  chronic blood loss 11/14/2021   Acute on chronic systolic CHF (congestive heart failure) ([place]) 07/07/2021   COPD (chronic obstructive pulmonary disease) (HCC)    Hyponatremia    SOB (shortness of breath)    DNR (do not resuscitate)    Weakness generalized    Elevated LFTs    Pulmonary embolism ([place]) 06/11/2021   Acute on chronic heart failure ([place]) 06/02/2021   GERD without esophagitis 05/08/2021   Mixed hyperlipidemia 05/08/2021   Hypertensive urgency 05/08/2021   Elevated troponin level not due myocardial infarction 05/08/2021   Pure hypercholesterolemia 09/23/2020   Claudication in peripheral vascular disease ([place]) 01/25/2020   Late latent syphilis 01/15/2020   Coronary artery disease involving native coronary artery of native heart 07/05/2019   Chronic kidney disease, stage 3a ([place]) 07/05/2019   Combined systolic and diastolic heart failure ([place]) 09/26/2018   Malignant neoplasm of prostate ([place]) 06/17/2018   Medication management    Dysphagia    Palliative care by specialist    Ischemic cardiomyopathy    Congestive dilated cardiomyopathy ([place])    Acute on chronic systolic congestive heart failure ([place]) 01/02/2017   Malignant neoplasm of pyriform sinus ([place]) 09/28/2013   Piriform sinus tumor 09/24/2013   NONSPECIFIC ABN FINDING RAD & OTH EXAM GI TRACT 05/14/2009    Past Medical History:  Diagnosis Date   Chronic combined systolic and diastolic CHF (congestive heart failure) ([place])    followed by dr  t. Moffat   COPD (chronic obstructive pulmonary disease) ([place])    Coronary artery disease cardiologist-- [name]   per cardiac cath 01-05-2017  chronic total occlusion pLCx with collaterals, 99% severe calcified prox. to mid RCA, otherwise mild to moderate CAD (medically managed)   Esophageal cancer, stage IIIB [facility]) oncologist-  dr ennever/  dr moody   dx 2010  SCC Stage IIIB completed chemoradiation;  localized recurrent left piriform sinus 01/ 2015,  completed  concurrent chemoradiatoin 04/ 2015   GERD (gastroesophageal reflux disease)    09-07-2018   no issues since Gtube removed 06/ 2019   History of alcohol abuse    quit 2001   History of cancer chemotherapy    2010;   2015   History of radiation therapy    10-19-2013 to  12-05-2013 pyriform sinus 69.96 Gy/84f;   Radiation completed 2010 for esophageal cancer   History of seizure 2001   alcohol withdrawal   HOH (hard of hearing)    Hyperlipidemia    Hypertension    Iron deficiency anemia due to chronic blood loss 11/14/2021   Ischemic cardiomyopathy 12/2016   01-03-2017  echo,  ef 10-15%/   echo 05-06-2017 EF improved to 45-50%   Prostate cancer ([facility] urologist-  dr ottelin/  oncologist-- dr mTammi Klippel  dx 05-27-2018--- Stage T1c,  Gleason 4+3,  PSA 9.3--  scheduled for brachytherapy 09-23-2017   Renal cyst, left     Past Surgical History:  Procedure Laterality Date   BIOPSY  12/18/2021   Procedure: BIOPSY;  Surgeon: JMilus Banister MD;  [facility];  Service: Gastroenterology;;   CARDIOVASCULAR STRESS TEST  10/09/09   normal nuclearr stress test, EF 57% ([name]   CENTRAL LINE INSERTION  06/16/2021   Procedure: CENTRAL LINE INSERTION;  Surgeon: BJolaine Artist MD;  [facility];  Service: Cardiovascular;;   ESOPHAGOGASTRODUODENOSCOPY (EGD) WITH PROPOFOL N/A 12/18/2021   Procedure: ESOPHAGOGASTRODUODENOSCOPY (EGD) WITH PROPOFOL;  Surgeon: JMilus Banister MD;  [facility];  Service: Gastroenterology;  Laterality: N/A;   IR GASTROSTOMY TUBE MOD SED  01/13/2017   IR GASTROSTOMY TUBE REMOVAL  02/03/2018   IR PATIENT EVAL TECH 0-60 MINS  03/25/2017   IR REMOVAL TUN ACCESS W/ PORT W/O FL MOD SED  01/15/2017   IR REPLACE G-TUBE SIMPLE WO FLUORO  01/13/2018   LARYNGOSCOPY N/A 09/15/2013   Procedure: LARYNGOSCOPY;  Surgeon: DMelida Quitter MD;  [facility]  Service: ENT;  Laterality: N/A;  direct laryngoscopy with biopsy and esophagoscopy    RADIOACTIVE SEED IMPLANT N/A 09/23/2018   Procedure: RADIOACTIVE SEED IMPLANT/BRACHYTHERAPY IMPLANT;  Surgeon: OKathie Rhodes MD;  [facility]: [facility]  Service: Urology;  Laterality: N/A;   RIGHT HEART CATH N/A 06/16/2021   Procedure: RIGHT HEART CATH;  Surgeon: BJolaine Artist MD;  [facility];  Service: Cardiovascular;  Laterality: N/A;   RIGHT/LEFT HEART CATH AND CORONARY ANGIOGRAPHY N/A 01/05/2017   Procedure: Right/Left Heart Cath and Coronary Angiography;  Surgeon: MBurnell Blanks MD;  [facility];  Service: Cardiovascular;  Laterality: N/A;   TRANSTHORACIC ECHOCARDIOGRAM  05/06/2017   ef 45-50%,  grade 1 diastolic dysfunction/  AV severe calcified non coronary cusp with moderate regurg. , no stenosis (valve area per echo 01-05-2017 1.08cm^2)/  mild MR, TR, and PR/  mild LAE    Social History   Tobacco Use   Smoking status: Former    Packs/day: 1.00  Years: 40.00    Total pack years: 40.00    Types: Cigarettes    Start date: 11/08/1960    Quit date: 08/31/2009    Years since quitting: 12.4   Smokeless tobacco: Former    Types: Chew    Quit date: 09/01/1999  Vaping Use   Vaping Use: Never used  Substance Use Topics   Alcohol use: Not Currently    Alcohol/week: 0.0 standard drinks of alcohol    Comment: quit in 2001   Drug use: No    Comment: back in the day used cocaine,alcohol, marijuana    Family History  Problem Relation Age of Onset   Breast cancer Mother    Prostate cancer Neg Hx    Colon cancer Neg Hx    Pancreatic cancer Neg Hx     No Known Allergies  Medication list has been reviewed and updated.  Current Outpatient Medications on File Prior to Visit  Medication Sig Dispense Refill   albuterol (VENTOLIN HFA) 108 (90 Base) MCG/ACT inhaler INHALE 2 PUFFS INTO THE LUNGS EVERY 6 HOURS AS NEEDED FOR WHEEZING OR SHORTNESS OF BREATH 20.1 g 1   apixaban (ELIQUIS) 5 MG TABS tablet Take 1 tablet (5 mg  total) by mouth 2 (two) times daily. 60 tablet 6   BAYER LOW DOSE 81 MG EC tablet Take 81 mg by mouth daily. Swallow whole.     dapagliflozin propanediol (FARXIGA) 10 MG TABS tablet Take 1 tablet (10 mg total) by mouth daily. 90 tablet 1   Evolocumab (REPATHA SURECLICK) 759 MG/ML SOAJ Inject 140 mg into the skin every 14 (fourteen) days. 2 mL 11   fentaNYL (DURAGESIC) 12 MCG/HR Place 1 patch onto the skin every 3 (three) days. 10 patch 0   furosemide (LASIX) 40 MG tablet Take 1 tablet (40 mg total) by mouth as needed. 30 tablet 3   hydrALAZINE (APRESOLINE) 25 MG tablet Take 0.5 tablets (12.5 mg total) by mouth 3 (three) times daily. 42 tablet 11   ipratropium (ATROVENT) 0.03 % nasal spray Place 2 sprays into both nostrils every 12 (twelve) hours. (Patient taking differently: Place 2 sprays into both nostrils every 12 (twelve) hours. As needed) 30 mL 12   ivabradine (CORLANOR) 5 MG TABS tablet Take 0.5 tablets (2.5 mg total) by mouth 2 (two) times daily with a meal. 30 tablet 3   Multiple Vitamin (MULTIVITAMIN) tablet Take 1 tablet by mouth 3 (three) times a week.     pantoprazole (PROTONIX) 40 MG tablet TAKE 1 TABLET(40 MG) BY MOUTH DAILY 30 tablet 3   polyethylene glycol (MIRALAX / GLYCOLAX) 17 g packet Take 17 g by mouth daily as needed for mild constipation. 14 each 0   pravastatin (PRAVACHOL) 80 MG tablet Take 1 tablet (80 mg total) by mouth every evening. 90 tablet 3   sacubitril-valsartan (ENTRESTO) 24-26 MG Take 1 tablet by mouth 2 (two) times daily. 60 tablet 11   sildenafil (VIAGRA) 50 MG tablet Take 1 tablet (50 mg total) by mouth daily as needed for erectile dysfunction. 10 tablet 0   spironolactone (ALDACTONE) 25 MG tablet Take 1 tablet (25 mg total) by mouth daily. 30 tablet 6   Current Facility-Administered Medications on File Prior to Visit  Medication Dose Route Frequency Provider Last Rate Last Admin   topical emolient (BIAFINE) emulsion   Topical Daily Kyung Rudd, MD   Given at  01/19/14 1638    Review of Systems:  As per HPI- otherwise negative.   Physical Examination:  Vitals:   02/25/22 1310  BP: 110/60  Pulse: 70  Resp: 18  Temp: 97.6 F (36.4 C)  SpO2: 98%   Vitals:   02/25/22 1310  Weight: 134 lb 3.2 oz (60.9 kg)  Height: '5\' 6"'$  (1.676 m)   Body mass index is 21.66 kg/m. Ideal Body Weight: Weight in (lb) to have BMI = 25: 154.6  GEN: no acute distress. Looks well!  HEENT: Atraumatic, Normocephalic.  Ears and Nose: No external deformity. CV: RRR, No M/G/R. No JVD. No thrill. No extra heart sounds. PULM: CTA B, no wheezes, crackles, rhonchi. No retractions. No resp. distress. No accessory muscle use EXTR: No c/c/e PSYCH: Normally interactive. Conversant.  Some mild joint thickening at the right 1st IP and MCP joints.  Pt has trigger finger at the IP joint    Assessment and Plan: Trigger finger of right thumb - Plan: Ambulatory referral to Hand Surgery  Immunization due - Plan: Pneumococcal polysaccharide vaccine 23-valent greater than or equal to 2yo subcutaneous/IM Trigger finger- referral to hand surgery for treatment- let me know if I can do anything else to help  Update pneumonia vaccine Recommend shingles and tetanus booster   Signed Lamar Blinks, MD

## 2022-02-23 ENCOUNTER — Other Ambulatory Visit: Payer: Self-pay | Admitting: *Deleted

## 2022-02-23 DIAGNOSIS — C12 Malignant neoplasm of pyriform sinus: Secondary | ICD-10-CM

## 2022-02-23 DIAGNOSIS — C61 Malignant neoplasm of prostate: Secondary | ICD-10-CM

## 2022-02-23 MED ORDER — FENTANYL 12 MCG/HR TD PT72: 1.0000 | MEDICATED_PATCH | TRANSDERMAL | 0 refills | Status: DC

## 2022-02-24 ENCOUNTER — Other Ambulatory Visit: Payer: Self-pay | Admitting: Family Medicine

## 2022-02-24 ENCOUNTER — Encounter: Payer: Self-pay | Admitting: Family Medicine

## 2022-02-24 DIAGNOSIS — G8929 Other chronic pain: Secondary | ICD-10-CM

## 2022-02-25 ENCOUNTER — Ambulatory Visit (HOSPITAL_BASED_OUTPATIENT_CLINIC_OR_DEPARTMENT_OTHER)
Admission: RE | Admit: 2022-02-25 | Discharge: 2022-02-25 | Disposition: A | Discharge status: Home / Self Care | Payer: Medicare Other | Source: Ambulatory Visit | Attending: Family Medicine | Admitting: Family Medicine

## 2022-02-25 ENCOUNTER — Ambulatory Visit (INDEPENDENT_AMBULATORY_CARE_PROVIDER_SITE_OTHER): Payer: Medicare Other | Admitting: Family Medicine

## 2022-02-25 VITALS — BP 110/60 | HR 70 | Temp 97.6°F | Resp 18 | Ht 66.0 in | Wt 134.2 lb

## 2022-02-25 DIAGNOSIS — G8929 Other chronic pain: Secondary | ICD-10-CM | POA: Insufficient documentation

## 2022-02-25 DIAGNOSIS — Z23 Encounter for immunization: Secondary | ICD-10-CM | POA: Diagnosis not present

## 2022-02-25 DIAGNOSIS — M79644 Pain in right finger(s): Secondary | ICD-10-CM | POA: Insufficient documentation

## 2022-02-25 DIAGNOSIS — M65311 Trigger thumb, right thumb: Secondary | ICD-10-CM

## 2022-02-25 NOTE — Patient Instructions (Signed)
It was good to see you today- we will be in touch with a hand surgeon appt for you asap.  You appear to have a "trigger finger" which is a tendon problem that we often can resolve!   Pneumonia booster given today I would also suggest getting a tetanus booster and if desired the shingles vaccine series at your pharmacy

## 2022-02-26 ENCOUNTER — Telehealth: Payer: Self-pay | Admitting: *Deleted

## 2022-02-26 ENCOUNTER — Ambulatory Visit: Payer: Self-pay

## 2022-02-26 DIAGNOSIS — I5040 Unspecified combined systolic (congestive) and diastolic (congestive) heart failure: Secondary | ICD-10-CM

## 2022-02-26 DIAGNOSIS — J449 Chronic obstructive pulmonary disease, unspecified: Secondary | ICD-10-CM

## 2022-02-26 NOTE — Chronic Care Management (AMB) (Signed)
Chronic Care Management   CCM RN Visit Note  02/26/2022 Name: Collin Young MRN: [id number] DOB: 1950/10/20  Subjective: Collin Young is a 71 y.o. year old male who is a primary care patient of Copland, Gay Filler, MD. The care management team was consulted for assistance with disease management and care coordination needs.    Received notification from care guide patient/daughter declines further engagement with care management services at this time.  Consent to Services:  The patient was given information about Chronic Care Management services, agreed to services, and gave verbal consent prior to initiation of services.  Please see initial visit note for detailed documentation.   Patient agreed to services and verbal consent obtained.   Assessment: Review of patient past medical history, allergies, medications, health status, including review of consultants reports, laboratory and other test data, was performed as part of comprehensive evaluation and provision of chronic care management services.   SDOH (Social Determinants of Health) assessments and interventions performed:    CCM Care Plan  No Known Allergies  Outpatient Encounter Medications as of 02/26/2022  Medication Sig   albuterol (VENTOLIN HFA) 108 (90 Base) MCG/ACT inhaler INHALE 2 PUFFS INTO THE LUNGS EVERY 6 HOURS AS NEEDED FOR WHEEZING OR SHORTNESS OF BREATH   apixaban (ELIQUIS) 5 MG TABS tablet Take 1 tablet (5 mg total) by mouth 2 (two) times daily.   BAYER LOW DOSE 81 MG EC tablet Take 81 mg by mouth daily. Swallow whole.   dapagliflozin propanediol (FARXIGA) 10 MG TABS tablet Take 1 tablet (10 mg total) by mouth daily.   Evolocumab (REPATHA SURECLICK) 782 MG/ML SOAJ Inject 140 mg into the skin every 14 (fourteen) days.   fentaNYL (DURAGESIC) 12 MCG/HR Place 1 patch onto the skin every 3 (three) days.   furosemide (LASIX) 40 MG tablet Take 1 tablet (40 mg total) by mouth as needed.   hydrALAZINE (APRESOLINE) 25  MG tablet Take 0.5 tablets (12.5 mg total) by mouth 3 (three) times daily.   ipratropium (ATROVENT) 0.03 % nasal spray Place 2 sprays into both nostrils every 12 (twelve) hours. (Patient taking differently: Place 2 sprays into both nostrils every 12 (twelve) hours. As needed)   ivabradine (CORLANOR) 5 MG TABS tablet Take 0.5 tablets (2.5 mg total) by mouth 2 (two) times daily with a meal.   Multiple Vitamin (MULTIVITAMIN) tablet Take 1 tablet by mouth 3 (three) times a week.   pantoprazole (PROTONIX) 40 MG tablet TAKE 1 TABLET(40 MG) BY MOUTH DAILY   polyethylene glycol (MIRALAX / GLYCOLAX) 17 g packet Take 17 g by mouth daily as needed for mild constipation.   pravastatin (PRAVACHOL) 80 MG tablet Take 1 tablet (80 mg total) by mouth every evening.   sacubitril-valsartan (ENTRESTO) 24-26 MG Take 1 tablet by mouth 2 (two) times daily.   sildenafil (VIAGRA) 50 MG tablet Take 1 tablet (50 mg total) by mouth daily as needed for erectile dysfunction.   spironolactone (ALDACTONE) 25 MG tablet Take 1 tablet (25 mg total) by mouth daily.   Facility-Administered Encounter Medications as of 02/26/2022  Medication   topical emolient (BIAFINE) emulsion    Patient Active Problem List   Diagnosis Date Noted   Iron deficiency anemia due to chronic blood loss 11/14/2021   Acute on chronic systolic CHF (congestive heart failure) ([place]) 07/07/2021   COPD (chronic obstructive pulmonary disease) (HCC)    Hyponatremia    SOB (shortness of breath)    DNR (do not resuscitate)    Weakness  generalized    Elevated LFTs    Pulmonary embolism ([place]) 06/11/2021   Acute on chronic heart failure ([place]) 06/02/2021   GERD without esophagitis 05/08/2021   Mixed hyperlipidemia 05/08/2021   Hypertensive urgency 05/08/2021   Elevated troponin level not due myocardial infarction 05/08/2021   Pure hypercholesterolemia 09/23/2020   Claudication in peripheral vascular disease ([place]) 01/25/2020   Late latent syphilis 01/15/2020    Coronary artery disease involving native coronary artery of native heart 07/05/2019   Chronic kidney disease, stage 3a ([place]) 07/05/2019   Combined systolic and diastolic heart failure ([place]) 09/26/2018   Malignant neoplasm of prostate ([place]) 06/17/2018   Medication management    Dysphagia    Palliative care by specialist    Ischemic cardiomyopathy    Congestive dilated cardiomyopathy ([place])    Acute on chronic systolic congestive heart failure ([place]) 01/02/2017   Malignant neoplasm of pyriform sinus ([place]) 09/28/2013   Piriform sinus tumor 09/24/2013   NONSPECIFIC ABN FINDING RAD & OTH EXAM GI TRACT 05/14/2009    Conditions to be addressed/monitored:CHF, HTN, and COPD  Care Plan : RN Care Manager Plan of Care  Updates made by Collin Rued, RN since 02/26/2022 12:00 AM  Completed 02/26/2022   Problem: No Plan of Care established for [name], CHF) Resolved 02/26/2022  Priority: High     Long-Range Goal: Development of Plan of Care for Chronic Disease Managment Completed 02/26/2022  Start Date: 07/17/2021  Expected End Date: 10/17/2021  Priority: High  Note:   Per Care guide:Declines further services. Goals closed Current Barriers: Collin Young wears hearing aid to both ears, speaks in whisper. admitted 07/06/21-07/04/21 with acute on chronic congestive heart failure, 06/11/21-06/24/21 with respiratory failure/COPD exacerbation/acute on chronic heart failure/acute Pulmonary embolus; admitted 06/02/21-06/03/21 with congestive heart failure; 05/07/21-05/11/21 with congestive heart failure. Patient lives alone and is independent. Daughter assist with medication management.  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 RNCM spoke with Collin Young. He reports he is doing well. He continues to weigh self daily. Denies signs/symptoms of HF exacerbations. Reports he had some shortness of breath for a couple  of days, but states has resolved and denies any issues at this time. Collin Young reports recent blood pressure of 170/60. Blood pressure taken during telephone assessment was 136/89. He is using a wrist monitor. RNCM called and spoke with daughter, Collin Young), who reports Collin Young has all his medications and is taking as prescribed with recent refills on prescriptions. She is unable to review medications with RNCM at this time. Patient with question regarding a "sexual problem" and states he or his daughter will call provider to schedule an appointment. Mr. Nied with no other concerns at this time. Knowledge Deficits related to plan of care for management of CHF and COPD  Chronic Disease Management support and education needs related to CHF and COPD  RNCM Clinical Goal(s):  Patient will verbalize basic understanding of CHF and COPD disease process and self health management plan as evidenced by self report. Attending provider visits, taking medications as prescribed. take all medications exactly as prescribed and will call provider for medication related questions as evidenced by self report and chart notations    attend all scheduled medical appointments: as scheduled/recommended as evidenced by self report, chart notation        through collaboration with RN Care manager, provider, and care team.   Interventions: 1:1 collaboration with primary care provider regarding development and  update of comprehensive plan of care as evidenced by provider attestation and co-signature Inter-disciplinary care team collaboration (see longitudinal plan of care) Evaluation of current treatment plan related to  self management and patient's adherence to plan as established by provider  COPD Interventions: Long Term: Goal on track: Yes  Reviewed upcoming appointments Encouraged to continue to take medications as recommended Encouraged to use rescue inhaler as needed  Heart Failure Interventions:  Long Term: Goal on track: yes Discussed importance of daily weight and advised patient to weigh and record daily Reviewed signs/symptoms of congestive heart failure and when to call the doctor   Encouraged to continue to eat Healthy and minimize salt  Hypertension Interventions:  (Status:  New goal.) Long Term Goal Last practice recorded BP readings:  BP Readings from Last 3 Encounters:  08/29/21 (!) 100/58  08/15/21 100/60  08/01/21 (!) 71/50  Most recent eGFR/CrCl:  Lab Results  Component Value Date   EGFR 83 (L) 03/10/2017    No components found for: CRCL  Evaluation of current treatment plan related to hypertension self management and patient's adherence to plan as established by provider Provided education to patient re: stroke prevention, s/s of heart attack and stroke Provided instructions for getting most accurate reading with a wrist blood pressure monitor Encouraged patient to check and record reading and take to provider office  Encouraged to take medications as prescription  Patient Goals/Self-Care Activities: Check blood pressure and record. Take with you to your provider visits. Ask your Doctor what is your Target blood pressure range Take medications as prescribed   follow rescue plan if symptoms flare-up eat more whole grains, fruits and vegetables, lean meats and healthy fats know when to call the doctor call for weight gain 3 pounds in a day or 5 pounds in one week; more swelling of your feet, ankles, stomach or hands; it is harder for you to breath when lying down and you need to sit up; Chest discomfort, heaviness or pain; you feel more tired or have less energy than normal, new or worsening dizziness, dry hacky cough, you feel uneasy and you know something is not right Continue to weight and record weights daily Attend follow up appointments as scheduled Contact your provider for Health questions or concerns as needed   Plan: No further follow up required.    Thea Silversmith, RN, MSN, BSN, CCM Care Management Coordinator [facility] [phone]

## 2022-02-26 NOTE — Chronic Care Management (AMB) (Signed)
  Chronic Care Management Note  02/26/2022 Name: [name] MRN: [id number] DOB: [birth year]  Collin Young is a 71 y.o. year old male who is a primary care patient of Copland, Gay Filler, MD and is actively engaged with the care management team. I reached out to [name] by phone today to assist with re-scheduling a follow up visit with the RN Case Manager  Follow up plan: Patient daughter Linus Orn declines further follow up and engagement by the care management team. Appropriate care team members and provider have been notified via electronic communication.   Julian Hy, [place] Management  Direct Dial: [phone]

## 2022-02-26 NOTE — Patient Instructions (Signed)
Visit Information  Thank you for allowing me to share the care management and care coordination services that are available to you as part of your health plan and services through your primary care provider and medical home. Please reach out to me at [phone] if the care management/care coordination team may be of assistance to you in the future.   Thea Silversmith, RN, MSN, BSN, CCM Care Management Coordinator [facility] [phone]

## 2022-03-06 ENCOUNTER — Inpatient Hospital Stay: Payer: Medicare Other | Attending: Hematology & Oncology

## 2022-03-06 ENCOUNTER — Inpatient Hospital Stay: Payer: Medicare Other | Admitting: Hematology & Oncology

## 2022-03-11 DIAGNOSIS — M65311 Trigger thumb, right thumb: Secondary | ICD-10-CM | POA: Diagnosis not present

## 2022-04-01 ENCOUNTER — Other Ambulatory Visit: Payer: Self-pay | Admitting: *Deleted

## 2022-04-01 DIAGNOSIS — C12 Malignant neoplasm of pyriform sinus: Secondary | ICD-10-CM

## 2022-04-01 DIAGNOSIS — C61 Malignant neoplasm of prostate: Secondary | ICD-10-CM

## 2022-04-01 MED ORDER — FENTANYL 12 MCG/HR TD PT72: 1.0000 | MEDICATED_PATCH | TRANSDERMAL | 0 refills | Status: DC

## 2022-04-29 ENCOUNTER — Other Ambulatory Visit (HOSPITAL_COMMUNITY): Payer: Self-pay

## 2022-04-29 ENCOUNTER — Other Ambulatory Visit (HOSPITAL_COMMUNITY): Payer: Self-pay | Admitting: Family Medicine

## 2022-04-29 MED ORDER — SPIRONOLACTONE 25 MG PO TABS: 25.0000 mg | ORAL_TABLET | Freq: Every day | ORAL | 8 refills | Status: DC

## 2022-05-06 ENCOUNTER — Other Ambulatory Visit: Payer: Self-pay

## 2022-05-06 ENCOUNTER — Other Ambulatory Visit: Payer: Self-pay | Admitting: *Deleted

## 2022-05-06 DIAGNOSIS — C61 Malignant neoplasm of prostate: Secondary | ICD-10-CM

## 2022-05-06 DIAGNOSIS — C12 Malignant neoplasm of pyriform sinus: Secondary | ICD-10-CM

## 2022-05-06 MED ORDER — FENTANYL 12 MCG/HR TD PT72: 1.0000 | MEDICATED_PATCH | TRANSDERMAL | 0 refills | Status: DC

## 2022-05-06 MED ORDER — DAPAGLIFLOZIN PROPANEDIOL 10 MG PO TABS: 10.0000 mg | ORAL_TABLET | Freq: Every day | ORAL | 3 refills | Status: DC

## 2022-05-06 NOTE — Telephone Encounter (Signed)
This is a CHF pt, Dr. Haroldine Laws pt

## 2022-05-07 ENCOUNTER — Encounter: Payer: Self-pay | Admitting: Hematology & Oncology

## 2022-05-14 ENCOUNTER — Ambulatory Visit (HOSPITAL_COMMUNITY)
Admission: RE | Admit: 2022-05-14 | Discharge: 2022-05-14 | Disposition: A | Discharge status: Home / Self Care | Payer: 59 | Source: Ambulatory Visit | Attending: Internal Medicine | Admitting: Internal Medicine

## 2022-05-14 ENCOUNTER — Encounter (HOSPITAL_COMMUNITY): Payer: Self-pay | Admitting: Internal Medicine

## 2022-05-14 VITALS — BP 100/62 | HR 92 | Wt 135.6 lb

## 2022-05-14 DIAGNOSIS — Z86711 Personal history of pulmonary embolism: Secondary | ICD-10-CM | POA: Insufficient documentation

## 2022-05-14 DIAGNOSIS — Z85819 Personal history of malignant neoplasm of unspecified site of lip, oral cavity, and pharynx: Secondary | ICD-10-CM | POA: Diagnosis not present

## 2022-05-14 DIAGNOSIS — E785 Hyperlipidemia, unspecified: Secondary | ICD-10-CM | POA: Diagnosis not present

## 2022-05-14 DIAGNOSIS — I2699 Other pulmonary embolism without acute cor pulmonale: Secondary | ICD-10-CM | POA: Diagnosis not present

## 2022-05-14 DIAGNOSIS — Z8501 Personal history of malignant neoplasm of esophagus: Secondary | ICD-10-CM

## 2022-05-14 DIAGNOSIS — I5042 Chronic combined systolic (congestive) and diastolic (congestive) heart failure: Secondary | ICD-10-CM | POA: Diagnosis present

## 2022-05-14 DIAGNOSIS — Z7901 Long term (current) use of anticoagulants: Secondary | ICD-10-CM | POA: Insufficient documentation

## 2022-05-14 DIAGNOSIS — I130 Hypertensive heart and chronic kidney disease with heart failure and stage 1 through stage 4 chronic kidney disease, or unspecified chronic kidney disease: Secondary | ICD-10-CM | POA: Diagnosis not present

## 2022-05-14 DIAGNOSIS — N1831 Chronic kidney disease, stage 3a: Secondary | ICD-10-CM | POA: Diagnosis not present

## 2022-05-14 DIAGNOSIS — Z923 Personal history of irradiation: Secondary | ICD-10-CM | POA: Insufficient documentation

## 2022-05-14 DIAGNOSIS — Z9221 Personal history of antineoplastic chemotherapy: Secondary | ICD-10-CM | POA: Insufficient documentation

## 2022-05-14 DIAGNOSIS — J449 Chronic obstructive pulmonary disease, unspecified: Secondary | ICD-10-CM | POA: Insufficient documentation

## 2022-05-14 DIAGNOSIS — I2510 Atherosclerotic heart disease of native coronary artery without angina pectoris: Secondary | ICD-10-CM

## 2022-05-14 DIAGNOSIS — Z7984 Long term (current) use of oral hypoglycemic drugs: Secondary | ICD-10-CM | POA: Diagnosis not present

## 2022-05-14 LAB — BASIC METABOLIC PANEL
Anion gap: 11 (ref 5–15)
BUN: 23 mg/dL (ref 8–23)
CO2: 26 mmol/L (ref 22–32)
Calcium: 9.3 mg/dL (ref 8.9–10.3)
Chloride: 101 mmol/L (ref 98–111)
Creatinine, Ser: 1.48 mg/dL — ABNORMAL HIGH (ref 0.61–1.24)
GFR, Estimated: 50 mL/min — ABNORMAL LOW (ref >60)
Glucose, Bld: 108 mg/dL — ABNORMAL HIGH (ref 70–99)
Potassium: 4 mmol/L (ref 3.5–5.1)
Sodium: 138 mmol/L (ref 135–145)

## 2022-05-14 LAB — BRAIN NATRIURETIC PEPTIDE: B Natriuretic Peptide: 730.7 pg/mL — ABNORMAL HIGH (ref 0.0–100.0)

## 2022-05-14 NOTE — Progress Notes (Signed)
ADVANCED HF CLINIC NOTE PCP: Copland, Gay Filler, MD HF Cardiologist: Dr. Haroldine Laws  HPI: Collin Young is a 71 y.o.male w/ chronic combined systolic and diastolic CHF/ ischemic CM, severe multivessel CAD treated medically, 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     HF dates back to 12/2016. Echo then w/ severely reduced LVEF 10-15%, RV mild-mod reduced, mod AI/MR/TR. LHC showed severe calcific disease in the proximal to mid RCA and chronic total occlusion of the proximal circumflex.There was consideration of RCA PCI with recommendations for medical management.    Admitted  9/22 with HF. Echo EF down 25-30%, RV not well visualized. Mod MR/AI. Chest CT showed  large right pleural effusion.  There was questionable PE on his CT but ultimately this was thought to be just artifact with poor opacification. Given w/ IV Lasix and discharged home on Entresto, Farxiga, Toprol XL and PRN lasix.    Admitted 06/11/21 with HF and acute hypoxic respiratory failure.Complicated by PE, pleural effusion, and cardiogenic shock. Diuresed with IV lasix.  Limited Echo EF 20%, global HK, RV normal.  RHC cardiogenic shock with R>L heart failure. GDMT titrated. Not felt to be a VAD candidate due to cachexia and RV failure. Discharged home, weight 121 lbs.    Admitted 07/07/21 with HF. Echo EF 25-30%, severe LV dysfunction, grade I DD, RV mildly reduced.  Diuresed with IV lasix and transitioned to po lasix 40 mg daily. Continued on GDMT, discharge weight 122 lbs.   Echo (2/23): EF 40-45%  Follow up 5/23, NYHA II, volume low. Lasix changed to PRN and Entresto decreased to 24/26.  Today she returns for HF follow up. Overall feeling fine. 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  Says he feels fine. Denies CP or SOB. Occasionally feels lightheaded. No palpitations or presyncope. Not taking any  lasix  Cardiac Studies Echo 2018 EF 15%.  Echo 05/2017 EF improved up to 45-50%, Mod AI. RV normal.   Echo 05/2020 EF down again, 35-40%. RV normal  Echo 10/22 EF 20%, global HK, RV mildly reduced Echo 11/22 EF 25-30%, global HK, RV mildly reduced. Echo 2/23: EF 40-45%  CMRI (12/22,) unable to quanify EF, LGE with prior infarct basal to mid inferior/inferolateral walls. LGE is greater than 50% transmural suggesting territory is not viable  Past Medical History:  Diagnosis Date   Chronic combined systolic and diastolic CHF (congestive heart failure) ([place])    followed by [name]   COPD (chronic obstructive pulmonary disease) ([place])    Coronary artery disease cardiologist-- [name]   per cardiac cath 01-05-2017  chronic total occlusion pLCx with collaterals, 99% severe calcified prox. to mid RCA, otherwise mild to moderate CAD (medically managed)   Esophageal cancer, stage IIIB [facility]) oncologist-  dr ennever/  dr moody   dx 2010  SCC Stage IIIB completed chemoradiation;  localized recurrent left piriform sinus 01/ 2015,  completed concurrent chemoradiatoin 04/ 2015   GERD (gastroesophageal reflux disease)    09-07-2018   no issues since Gtube removed 06/ 2019   History of alcohol abuse    quit 2001   History of cancer chemotherapy    2010;   2015   History of radiation therapy    10-19-2013 to  12-05-2013 pyriform sinus 69.96 Gy/47f;   Radiation completed 2010 for esophageal cancer   History of seizure 2001  alcohol withdrawal   HOH (hard of hearing)    Hyperlipidemia    Hypertension    Iron deficiency anemia due to chronic blood loss 11/14/2021   Ischemic cardiomyopathy 12/2016   01-03-2017  echo,  ef 10-15%/   echo 05-06-2017 EF improved to 45-50%   Prostate cancer [facility]) urologist-  dr ottelin/  oncologist-- [name]   dx 05-27-2018--- Stage T1c,  Gleason 4+3,  PSA 9.3--  scheduled for brachytherapy 09-23-2017   Renal cyst, left    Current Outpatient  Medications  Medication Sig Dispense Refill   albuterol (VENTOLIN HFA) 108 (90 Base) MCG/ACT inhaler INHALE 2 PUFFS INTO THE LUNGS EVERY 6 HOURS AS NEEDED FOR WHEEZING OR SHORTNESS OF BREATH 20.1 g 1   BAYER LOW DOSE 81 MG EC tablet Take 81 mg by mouth daily. Swallow whole.     dapagliflozin propanediol (FARXIGA) 10 MG TABS tablet Take 1 tablet (10 mg total) by mouth daily. 90 tablet 3   ELIQUIS 5 MG TABS tablet TAKE 1 TABLET(5 MG) BY MOUTH TWICE DAILY 60 tablet 6   Evolocumab (REPATHA SURECLICK) 546 MG/ML SOAJ Inject 140 mg into the skin every 14 (fourteen) days. 2 mL 11   fentaNYL (DURAGESIC) 12 MCG/HR Place 1 patch onto the skin every 3 (three) days. 10 patch 0   furosemide (LASIX) 40 MG tablet Take 1 tablet (40 mg total) by mouth as needed. 30 tablet 3   hydrALAZINE (APRESOLINE) 25 MG tablet Take 0.5 tablets (12.5 mg total) by mouth 3 (three) times daily. 42 tablet 11   ipratropium (ATROVENT) 0.03 % nasal spray Place 2 sprays into both nostrils every 12 (twelve) hours. (Patient taking differently: Place 2 sprays into both nostrils every 12 (twelve) hours. As needed) 30 mL 12   ivabradine (CORLANOR) 5 MG TABS tablet Take 0.5 tablets (2.5 mg total) by mouth 2 (two) times daily with a meal. 30 tablet 3   Multiple Vitamin (MULTIVITAMIN) tablet Take 1 tablet by mouth 3 (three) times a week.     pantoprazole (PROTONIX) 40 MG tablet TAKE 1 TABLET(40 MG) BY MOUTH DAILY 30 tablet 3   polyethylene glycol (MIRALAX / GLYCOLAX) 17 g packet Take 17 g by mouth daily as needed for mild constipation. 14 each 0   pravastatin (PRAVACHOL) 80 MG tablet Take 1 tablet (80 mg total) by mouth every evening. 90 tablet 3   sacubitril-valsartan (ENTRESTO) 24-26 MG Take 1 tablet by mouth 2 (two) times daily. 60 tablet 11   sildenafil (VIAGRA) 50 MG tablet Take 1 tablet (50 mg total) by mouth daily as needed for erectile dysfunction. 10 tablet 0   spironolactone (ALDACTONE) 25 MG tablet Take 1 tablet (25 mg total) by mouth  daily. 30 tablet 8   No current facility-administered medications for this encounter.   Facility-Administered Medications Ordered in Other Encounters  Medication Dose Route Frequency Provider Last Rate Last Admin   topical emolient (BIAFINE) emulsion   Topical Daily Kyung Rudd, MD   Given at 01/19/14 0912   No Known Allergies  Social History   Socioeconomic History   Marital status: Legally Separated    Spouse name: Not on file   Number of children: 2   Years of education: Not on file   Highest education level: Not on file  Occupational History   Occupation: retired  Tobacco Use   Smoking status: Former    Packs/day: 1.00    Years: 40.00    Total pack years: 40.00    Types: Cigarettes  Start date: 11/08/1960    Quit date: 08/31/2009    Years since quitting: 12.7   Smokeless tobacco: Former    Types: Chew    Quit date: 09/01/1999  Vaping Use   Vaping Use: Never used  Substance and Sexual Activity   Alcohol use: Not Currently    Alcohol/week: 0.0 standard drinks of alcohol    Comment: quit in 2001   Drug use: No    Comment: back in the day used cocaine,alcohol, marijuana   Sexual activity: Yes    Partners: Female    Birth control/protection: None  Other Topics Concern   Not on file  Social History Narrative   Not on file   Social Determinants of Health   Financial Resource Strain: Low Risk  (08/29/2021)   Overall Financial Resource Strain (CARDIA)    Difficulty of Paying Living Expenses: Not hard at all  Food Insecurity: No Food Insecurity (08/29/2021)   Hunger Vital Sign    Worried About Running Out of Food in the Last Year: Never true    Ran Out of Food in the Last Year: Never true  Transportation Needs: No Transportation Needs (07/17/2021)   PRAPARE - Hydrologist (Medical): No    Lack of Transportation (Non-Medical): No  Physical Activity: Insufficiently Active (08/29/2021)   Exercise Vital Sign    Days of Exercise per Week: 7  days    Minutes of Exercise per Session: 20 min  Stress: No Stress Concern Present (08/29/2021)   [place]    Feeling of Stress : Not at all  Social Connections: Moderately Integrated (08/29/2021)   Social Connection and Isolation Panel [NHANES]    Frequency of Communication with Friends and Family: More than three times a week    Frequency of Social Gatherings with Friends and Family: More than three times a week    Attends Religious Services: More than 4 times per year    Active Member of [employer] or Organizations: Yes    Attends Archivist Meetings: More than 4 times per year    Marital Status: Separated  Intimate Partner Violence: Not At Risk (08/29/2021)   Humiliation, Afraid, Rape, and Kick questionnaire    Fear of Current or Ex-Partner: No    Emotionally Abused: No    Physically Abused: No    Sexually Abused: No   Family History  Problem Relation Age of Onset   Breast cancer Mother    Prostate cancer Neg Hx    Colon cancer Neg Hx    Pancreatic cancer Neg Hx    BP 100/62   Pulse 92   Wt 61.5 kg (135 lb 9.6 oz)   SpO2 96%   BMI 21.89 kg/m   Wt Readings from Last 3 Encounters:  05/14/22 61.5 kg (135 lb 9.6 oz)  02/25/22 60.9 kg (134 lb 3.2 oz)  02/11/22 62.1 kg (136 lb 12.8 oz)   PHYSICAL EXAM: General:  Well appearing. No resp difficulty. Hoarse HEENT: normal Neck: supple. no JVD. Carotids 2+ bilat; no bruits. No lymphadenopathy or thryomegaly appreciated. Cor: PMI nondisplaced. Regular rate & rhythm. No rubs, gallops or murmurs. Lungs: clear Abdomen: soft, nontender, nondistended. No hepatosplenomegaly. No bruits or masses. Good bowel sounds. Extremities: no cyanosis, clubbing, rash, edema Neuro: alert & orientedx3, cranial nerves grossly intact. moves all 4 extremities w/o difficulty. Affect pleasant    ASSESSMENT & PLAN:  1.  Chronic Systolic Heart Failure  - Echo (  2018): EF 10-15%,  RV moderately reduced. LHC w/ MVCAD management medically - Echo (9/21): EF down again, 35-40%. RV normal  - Echo (9/22): EF down 25-30%, RV not well visualized. Mod MR and Mod AI. - Limited Echo 10/22: EF 20%, RV normal  - RHC (06/16/21): w/ R>L HF and low output, CI 1.8. PAPi 1.3. - Echo (11/22): EF 25-30%, global HK, RV mildly reduced. - cMRI (12/22): unable to quanify EF, LGE with prior infarct basal to mid inferior/inferolateral walls. LGE is greater than 50% transmural suggesting territory is not viable. - Echo (2/23): EF improved 40-45%.  - Stable NYHA II. Volume status ok on Lasix 40 mg PRN.  - BP low. Stop hydralazine - Continue ivabradine 2.5 mg bid.  - Continue Entresto 24/26 mg bid (send in Rx for 24/26, he has been taking 1/2 tab of 49/51). - Continue spiro 25 mg daily. - Continue Farxiga 10 mg daily. - Now off digoxin with improving EF.   - Not felt to be VAD candidate due to cachexia and RV failure.  - Continues to do quite well. Will stop hydralazine with low BP   2. Pulmonary Embolism  - Diagnosed 10/22. - Limited echo w/ no RV strain  - Suspect incidental due HF. - Continue Eliquis 5 bid - No bleeding   3. CKD Stage IIIa - BMET today.   4. H/o Pleural Effusion, right - s/p thoracentesis by 06/11/21 - no recurrence.   5. H/o esophageal CA and throat CA - s/p chemo and XRT.   Glori Bickers, MD  3:46 PM

## 2022-05-14 NOTE — Patient Instructions (Signed)
STOP Hydralazine  Labs done today, your results will be available in MyChart, we will contact you for abnormal readings.  Your physician recommends that you schedule a follow-up appointment in: 6 months with an echocardiogram  If you have any questions or concerns before your next appointment please send [place] a message through [place] or call our office at [phone].    TO LEAVE A MESSAGE FOR THE NURSE SELECT OPTION 2, PLEASE LEAVE A MESSAGE INCLUDING: YOUR NAME DATE OF BIRTH CALL BACK NUMBER REASON FOR CALL**this is important as we prioritize the call backs  YOU WILL RECEIVE A CALL BACK THE SAME DAY AS LONG AS YOU CALL BEFORE 4:00 PM  At the [facility], you and your health needs are our priority. As part of our continuing mission to provide you with exceptional heart care, we have created designated Provider Care Teams. These Care Teams include your primary Cardiologist (physician) and Advanced Practice Providers (APPs- Physician Assistants and Nurse Practitioners) who all work together to provide you with the care you need, when you need it.   You may see any of the following providers on your designated Care Team at your next follow up: Dr Glori Bickers Dr Loralie Champagne Dr. Roxana Hires, NP Lyda Jester, [place] [facility] [name], [place] Forestine Na, NP Audry Riles, PharmD   Please be sure to bring in all your medications bottles to every appointment.

## 2022-05-22 ENCOUNTER — Telehealth: Payer: Self-pay | Admitting: Family Medicine

## 2022-05-22 NOTE — Telephone Encounter (Signed)
Pt dropped off document to be filled out by provider (1 page Disability Parking Placard) Pt would like to be called at [phone] when document ready. Document put at front office tray under providers name.

## 2022-05-26 NOTE — Telephone Encounter (Signed)
Parking Placard received and is in folder for completion.

## 2022-05-27 NOTE — Telephone Encounter (Signed)
Tried calling the [phone] number that was on the form, so it could be picked up. No on answered and VM box was full. Will try again.

## 2022-06-01 ENCOUNTER — Other Ambulatory Visit (HOSPITAL_COMMUNITY): Payer: Self-pay | Admitting: Family Medicine

## 2022-06-02 ENCOUNTER — Other Ambulatory Visit (HOSPITAL_BASED_OUTPATIENT_CLINIC_OR_DEPARTMENT_OTHER): Payer: Self-pay

## 2022-06-02 ENCOUNTER — Other Ambulatory Visit: Payer: Self-pay | Admitting: Hematology & Oncology

## 2022-06-02 DIAGNOSIS — C61 Malignant neoplasm of prostate: Secondary | ICD-10-CM

## 2022-06-02 DIAGNOSIS — C12 Malignant neoplasm of pyriform sinus: Secondary | ICD-10-CM

## 2022-06-02 MED ORDER — FENTANYL 12 MCG/HR TD PT72: 1.0000 | MEDICATED_PATCH | TRANSDERMAL | 0 refills | Status: DC

## 2022-06-17 ENCOUNTER — Other Ambulatory Visit (HOSPITAL_COMMUNITY): Payer: Self-pay | Admitting: Family Medicine

## 2022-06-25 ENCOUNTER — Other Ambulatory Visit (HOSPITAL_BASED_OUTPATIENT_CLINIC_OR_DEPARTMENT_OTHER): Payer: Self-pay | Admitting: Cardiovascular Disease

## 2022-06-30 ENCOUNTER — Encounter: Payer: Self-pay | Admitting: Family Medicine

## 2022-06-30 MED ORDER — PRAVASTATIN SODIUM 80 MG PO TABS: ORAL_TABLET | ORAL | 3 refills | Status: DC

## 2022-06-30 NOTE — Telephone Encounter (Signed)
Looks like Cardiology filled this last. Okay to fill?

## 2022-07-08 ENCOUNTER — Other Ambulatory Visit: Payer: Self-pay

## 2022-07-08 DIAGNOSIS — C61 Malignant neoplasm of prostate: Secondary | ICD-10-CM

## 2022-07-08 DIAGNOSIS — C12 Malignant neoplasm of pyriform sinus: Secondary | ICD-10-CM

## 2022-07-08 MED ORDER — FENTANYL 12 MCG/HR TD PT72: 1.0000 | MEDICATED_PATCH | TRANSDERMAL | 0 refills | Status: DC

## 2022-08-03 ENCOUNTER — Other Ambulatory Visit: Payer: Self-pay | Admitting: *Deleted

## 2022-08-03 DIAGNOSIS — C61 Malignant neoplasm of prostate: Secondary | ICD-10-CM

## 2022-08-03 DIAGNOSIS — C12 Malignant neoplasm of pyriform sinus: Secondary | ICD-10-CM

## 2022-08-03 MED ORDER — FENTANYL 12 MCG/HR TD PT72: 1.0000 | MEDICATED_PATCH | TRANSDERMAL | 0 refills | Status: DC

## 2022-08-07 ENCOUNTER — Inpatient Hospital Stay: Payer: 59

## 2022-08-07 ENCOUNTER — Inpatient Hospital Stay: Payer: 59 | Attending: Hematology & Oncology | Admitting: Hematology & Oncology

## 2022-08-30 NOTE — Patient Instructions (Incomplete)
It was good to see you again today!  Recommend the latest COVID-19 booster, shingles vaccine, RSV, tetanus booster Please stop by the lab and then the ground floor imaging dept for your chest film For occasional back pain try tylenol as needed   Assuming all is well please see me in about 6 months Flu shot today

## 2022-08-30 NOTE — Progress Notes (Unsigned)
[place] at [facility] [address], [place], [place] [address] [phone]  Date:  09/02/2022   Name:  Collin Young   DOB:  [birth year]   MRN:  [id number]  PCP:  Darreld Mclean, MD    Chief Complaint: No chief complaint on file.   History of Present Illness:  Collin Young is a 71 y.o. very pleasant male patient who presents with the following:  Patient seen today for periodic follow-up Most recent visit with myself was in June  Medically complex patient-seen in heart failure clinic on 9/14-most recent EF 40 to 45%: - Stable NYHA II. Volume status ok on Lasix 40 mg PRN.  - BP low. Stop hydralazine - Continue ivabradine 2.5 mg bid.  - Continue Entresto 24/26 mg bid (send in Rx for 24/26, he has been taking 1/2 tab of 49/51). - Continue spiro 25 mg daily. - Continue Farxiga 10 mg daily. - Now off digoxin with improving EF.   - Not felt to be VAD candidate due to cachexia and RV failure.  - Continues to do quite well. Will stop hydralazine with low BP He is also currently taking Eliquis for a pulmonary embolism History of esophageal and throat cancer, status post chemo and radiation- no current treatment  Flu shot Recommend COVID booster Recommend tetanus, Shingrix ?  Does he want PSA screening Patient Active Problem List   Diagnosis Date Noted   Iron deficiency anemia due to chronic blood loss 11/14/2021   Acute on chronic systolic CHF (congestive heart failure) ([place]) 07/07/2021   COPD (chronic obstructive pulmonary disease) (HCC)    Hyponatremia    SOB (shortness of breath)    DNR (do not resuscitate)    Weakness generalized    Elevated LFTs    Pulmonary embolism ([place]) 06/11/2021   Acute on chronic heart failure ([place]) 06/02/2021   GERD without esophagitis 05/08/2021   Mixed hyperlipidemia 05/08/2021   Hypertensive urgency 05/08/2021   Elevated troponin level not due myocardial infarction 05/08/2021    Pure hypercholesterolemia 09/23/2020   Claudication in peripheral vascular disease ([place]) 01/25/2020   Late latent syphilis 01/15/2020   Coronary artery disease involving native coronary artery of native heart 07/05/2019   Chronic kidney disease, stage 3a ([place]) 07/05/2019   Combined systolic and diastolic heart failure ([place]) 09/26/2018   Malignant neoplasm of prostate ([place]) 06/17/2018   Medication management    Dysphagia    Palliative care by specialist    Ischemic cardiomyopathy    Congestive dilated cardiomyopathy ([place])    Acute on chronic systolic congestive heart failure ([place]) 01/02/2017   Malignant neoplasm of pyriform sinus ([place]) 09/28/2013   Piriform sinus tumor 09/24/2013   NONSPECIFIC ABN FINDING RAD & OTH EXAM GI TRACT 05/14/2009    Past Medical History:  Diagnosis Date   Chronic combined systolic and diastolic CHF (congestive heart failure) ([place])    followed by [name]   COPD (chronic obstructive pulmonary disease) ([place])    Coronary artery disease cardiologist-- [name]   per cardiac cath 01-05-2017  chronic total occlusion pLCx with collaterals, 99% severe calcified prox. to mid RCA, otherwise mild to moderate CAD (medically managed)   Esophageal cancer, stage IIIB [facility]) oncologist-  dr ennever/  dr moody   dx 2010  SCC Stage IIIB completed chemoradiation;  localized recurrent left piriform sinus 01/ 2015,  completed concurrent chemoradiatoin 04/ 2015   GERD (gastroesophageal reflux disease)  09-07-2018   no issues since Gtube removed 06/ 2019   History of alcohol abuse    quit 2001   History of cancer chemotherapy    2010;   2015   History of radiation therapy    10-19-2013 to  12-05-2013 pyriform sinus 69.96 Gy/85f;   Radiation completed 2010 for esophageal cancer   History of seizure 2001   alcohol withdrawal   HOH (hard of hearing)    Hyperlipidemia    Hypertension    Iron deficiency anemia due to chronic blood loss 11/14/2021   Ischemic  cardiomyopathy 12/2016   01-03-2017  echo,  ef 10-15%/   echo 05-06-2017 EF improved to 45-50%   Prostate cancer ([facility] urologist-  dr ottelin/  oncologist-- dr mTammi Klippel  dx 05-27-2018--- Stage T1c,  Gleason 4+3,  PSA 9.3--  scheduled for brachytherapy 09-23-2017   Renal cyst, left     Past Surgical History:  Procedure Laterality Date   BIOPSY  12/18/2021   Procedure: BIOPSY;  Surgeon: JMilus Banister MD;  [facility];  Service: Gastroenterology;;   CARDIOVASCULAR STRESS TEST  10/09/09   normal nuclearr stress test, EF 57% ([name]   CENTRAL LINE INSERTION  06/16/2021   Procedure: CENTRAL LINE INSERTION;  Surgeon: BJolaine Artist MD;  [facility];  Service: Cardiovascular;;   ESOPHAGOGASTRODUODENOSCOPY (EGD) WITH PROPOFOL N/A 12/18/2021   Procedure: ESOPHAGOGASTRODUODENOSCOPY (EGD) WITH PROPOFOL;  Surgeon: JMilus Banister MD;  [facility];  Service: Gastroenterology;  Laterality: N/A;   IR GASTROSTOMY TUBE MOD SED  01/13/2017   IR GASTROSTOMY TUBE REMOVAL  02/03/2018   IR PATIENT EVAL TECH 0-60 MINS  03/25/2017   IR REMOVAL TUN ACCESS W/ PORT W/O FL MOD SED  01/15/2017   IR REPLACE G-TUBE SIMPLE WO FLUORO  01/13/2018   LARYNGOSCOPY N/A 09/15/2013   Procedure: LARYNGOSCOPY;  Surgeon: DMelida Quitter MD;  [facility]  Service: ENT;  Laterality: N/A;  direct laryngoscopy with biopsy and esophagoscopy   RADIOACTIVE SEED IMPLANT N/A 09/23/2018   Procedure: RADIOACTIVE SEED IMPLANT/BRACHYTHERAPY IMPLANT;  Surgeon: OKathie Rhodes MD;  [facility]  Service: Urology;  Laterality: N/A;   RIGHT HEART CATH N/A 06/16/2021   Procedure: RIGHT HEART CATH;  Surgeon: BJolaine Artist MD;  [facility];  Service: Cardiovascular;  Laterality: N/A;   RIGHT/LEFT HEART CATH AND CORONARY ANGIOGRAPHY N/A 01/05/2017   Procedure: Right/Left Heart Cath and Coronary Angiography;  Surgeon: MBurnell Blanks MD;  [facility];  Service: Cardiovascular;  Laterality: N/A;   TRANSTHORACIC ECHOCARDIOGRAM  05/06/2017   ef 45-50%,  grade 1 diastolic dysfunction/  AV severe calcified non coronary cusp with moderate regurg. , no stenosis (valve area per echo 01-05-2017 1.08cm^2)/  mild MR, TR, and PR/  mild LAE    Social History   Tobacco Use   Smoking status: Former    Packs/day: 1.00    Years: 40.00    Total pack years: 40.00    Types: Cigarettes    Start date: 11/08/1960    Quit date: 08/31/2009    Years since quitting: 13.0   Smokeless tobacco: Former    Types: Chew    Quit date: 09/01/1999  Vaping Use   Vaping Use: Never used  Substance Use Topics   Alcohol use: Not Currently    Alcohol/week: 0.0 standard drinks of alcohol    Comment: quit in 2001   Drug use: No  Comment: back in the day used cocaine,alcohol, marijuana    Family History  Problem Relation Age of Onset   Breast cancer Mother    Prostate cancer Neg Hx    Colon cancer Neg Hx    Pancreatic cancer Neg Hx     No Known Allergies  Medication list has been reviewed and updated.  Current Outpatient Medications on File Prior to Visit  Medication Sig Dispense Refill   albuterol (VENTOLIN HFA) 108 (90 Base) MCG/ACT inhaler INHALE 2 PUFFS INTO THE LUNGS EVERY 6 HOURS AS NEEDED FOR WHEEZING OR SHORTNESS OF BREATH 20.1 g 1   BAYER LOW DOSE 81 MG EC tablet Take 81 mg by mouth daily. Swallow whole.     CORLANOR 5 MG TABS tablet TAKE 1/2 TABLET(2.5 MG) BY MOUTH TWICE DAILY WITH A MEAL 45 tablet 3   dapagliflozin propanediol (FARXIGA) 10 MG TABS tablet Take 1 tablet (10 mg total) by mouth daily. 90 tablet 3   ELIQUIS 5 MG TABS tablet TAKE 1 TABLET(5 MG) BY MOUTH TWICE DAILY 60 tablet 6   ENTRESTO 24-26 MG TAKE 1 TABLET BY MOUTH TWICE DAILY 60 tablet 11   Evolocumab (REPATHA SURECLICK) 841 MG/ML SOAJ Inject 140 mg into the skin every 14 (fourteen) days. 2 mL 11   fentaNYL (DURAGESIC) 12 MCG/HR Place 1 patch onto the skin every 3  (three) days. 10 patch 0   furosemide (LASIX) 40 MG tablet Take 1 tablet (40 mg total) by mouth as needed. 30 tablet 3   ipratropium (ATROVENT) 0.03 % nasal spray Place 2 sprays into both nostrils every 12 (twelve) hours. (Patient taking differently: Place 2 sprays into both nostrils every 12 (twelve) hours. As needed) 30 mL 12   Multiple Vitamin (MULTIVITAMIN) tablet Take 1 tablet by mouth 3 (three) times a week.     pantoprazole (PROTONIX) 40 MG tablet TAKE 1 TABLET(40 MG) BY MOUTH DAILY 30 tablet 3   polyethylene glycol (MIRALAX / GLYCOLAX) 17 g packet Take 17 g by mouth daily as needed for mild constipation. 14 each 0   pravastatin (PRAVACHOL) 80 MG tablet TAKE 1 TABLET(80 MG) BY MOUTH EVERY EVENING 90 tablet 3   sildenafil (VIAGRA) 50 MG tablet Take 1 tablet (50 mg total) by mouth daily as needed for erectile dysfunction. 10 tablet 0   spironolactone (ALDACTONE) 25 MG tablet Take 1 tablet (25 mg total) by mouth daily. 30 tablet 8   Current Facility-Administered Medications on File Prior to Visit  Medication Dose Route Frequency Provider Last Rate Last Admin   topical emolient (BIAFINE) emulsion   Topical Daily Kyung Rudd, MD   Given at 01/19/14 6606    Review of Systems:  As per HPI- otherwise negative.   Physical Examination: There were no vitals filed for this visit. There were no vitals filed for this visit. There is no height or weight on file to calculate BMI. Ideal Body Weight:    GEN: no acute distress. HEENT: Atraumatic, Normocephalic.  Ears and Nose: No external deformity. CV: RRR, No M/G/R. No JVD. No thrill. No extra heart sounds. PULM: CTA B, no wheezes, crackles, rhonchi. No retractions. No resp. distress. No accessory muscle use. ABD: S, NT, ND, +BS. No rebound. No HSM. EXTR: No c/c/e PSYCH: Normally interactive. Conversant.    Assessment and Plan: ***  Signed Lamar Blinks, MD

## 2022-09-01 ENCOUNTER — Other Ambulatory Visit: Payer: Self-pay | Admitting: *Deleted

## 2022-09-01 DIAGNOSIS — C12 Malignant neoplasm of pyriform sinus: Secondary | ICD-10-CM

## 2022-09-01 DIAGNOSIS — C61 Malignant neoplasm of prostate: Secondary | ICD-10-CM

## 2022-09-01 MED ORDER — FENTANYL 12 MCG/HR TD PT72: 1.0000 | MEDICATED_PATCH | TRANSDERMAL | 0 refills | Status: DC

## 2022-09-02 ENCOUNTER — Ambulatory Visit (INDEPENDENT_AMBULATORY_CARE_PROVIDER_SITE_OTHER): Payer: 59 | Admitting: Family Medicine

## 2022-09-02 ENCOUNTER — Other Ambulatory Visit (HOSPITAL_BASED_OUTPATIENT_CLINIC_OR_DEPARTMENT_OTHER): Payer: Self-pay

## 2022-09-02 ENCOUNTER — Encounter: Payer: Self-pay | Admitting: Family Medicine

## 2022-09-02 ENCOUNTER — Encounter: Payer: Self-pay | Admitting: Hematology & Oncology

## 2022-09-02 ENCOUNTER — Ambulatory Visit (HOSPITAL_BASED_OUTPATIENT_CLINIC_OR_DEPARTMENT_OTHER)
Admission: RE | Admit: 2022-09-02 | Discharge: 2022-09-02 | Disposition: A | Discharge status: Home / Self Care | Payer: 59 | Source: Ambulatory Visit | Attending: Family Medicine | Admitting: Family Medicine

## 2022-09-02 VITALS — BP 124/80 | HR 85 | Temp 98.0°F | Resp 18 | Ht 66.0 in | Wt 139.6 lb

## 2022-09-02 DIAGNOSIS — R052 Subacute cough: Secondary | ICD-10-CM

## 2022-09-02 DIAGNOSIS — M545 Low back pain, unspecified: Secondary | ICD-10-CM | POA: Diagnosis not present

## 2022-09-02 DIAGNOSIS — Z23 Encounter for immunization: Secondary | ICD-10-CM

## 2022-09-02 DIAGNOSIS — Z5181 Encounter for therapeutic drug level monitoring: Secondary | ICD-10-CM

## 2022-09-03 ENCOUNTER — Ambulatory Visit: Payer: 59

## 2022-09-03 ENCOUNTER — Encounter: Payer: Self-pay | Admitting: Family Medicine

## 2022-09-03 LAB — CBC
HCT: 41.1 % (ref 39.0–52.0)
Hemoglobin: 13.3 g/dL (ref 13.0–17.0)
MCHC: 32.4 g/dL (ref 30.0–36.0)
MCV: 86.4 fl (ref 78.0–100.0)
Platelets: 261 10*3/uL (ref 150.0–400.0)
RBC: 4.76 Mil/uL (ref 4.22–5.81)
RDW: 14.6 % (ref 11.5–15.5)
WBC: 4.3 10*3/uL (ref 4.0–10.5)

## 2022-09-03 LAB — COMPREHENSIVE METABOLIC PANEL
ALT: 7 U/L (ref 0–53)
AST: 12 U/L (ref 0–37)
Albumin: 3.9 g/dL (ref 3.5–5.2)
Alkaline Phosphatase: 32 U/L — ABNORMAL LOW (ref 39–117)
BUN: 22 mg/dL (ref 6–23)
CO2: 31 mEq/L (ref 19–32)
Calcium: 9.2 mg/dL (ref 8.4–10.5)
Chloride: 96 mEq/L (ref 96–112)
Creatinine, Ser: 1.44 mg/dL (ref 0.40–1.50)
GFR: 48.79 mL/min — ABNORMAL LOW (ref >60.00)
Glucose, Bld: 84 mg/dL (ref 70–99)
Potassium: 4.3 mEq/L (ref 3.5–5.1)
Sodium: 135 mEq/L (ref 135–145)
Total Bilirubin: 0.2 mg/dL (ref 0.2–1.2)
Total Protein: 6.7 g/dL (ref 6.0–8.3)

## 2022-09-16 ENCOUNTER — Inpatient Hospital Stay (HOSPITAL_BASED_OUTPATIENT_CLINIC_OR_DEPARTMENT_OTHER): Payer: 59 | Admitting: Hematology & Oncology

## 2022-09-16 ENCOUNTER — Encounter: Payer: Self-pay | Admitting: Hematology & Oncology

## 2022-09-16 ENCOUNTER — Inpatient Hospital Stay: Payer: 59 | Attending: Hematology & Oncology

## 2022-09-16 VITALS — BP 101/50 | HR 94 | Temp 98.0°F | Resp 20 | Ht 66.0 in | Wt 139.8 lb

## 2022-09-16 DIAGNOSIS — Z79899 Other long term (current) drug therapy: Secondary | ICD-10-CM | POA: Diagnosis not present

## 2022-09-16 DIAGNOSIS — C61 Malignant neoplasm of prostate: Secondary | ICD-10-CM

## 2022-09-16 DIAGNOSIS — C12 Malignant neoplasm of pyriform sinus: Secondary | ICD-10-CM | POA: Diagnosis not present

## 2022-09-16 DIAGNOSIS — I255 Ischemic cardiomyopathy: Secondary | ICD-10-CM | POA: Diagnosis not present

## 2022-09-16 DIAGNOSIS — Z7901 Long term (current) use of anticoagulants: Secondary | ICD-10-CM | POA: Diagnosis not present

## 2022-09-16 DIAGNOSIS — Z8522 Personal history of malignant neoplasm of nasal cavities, middle ear, and accessory sinuses: Secondary | ICD-10-CM | POA: Insufficient documentation

## 2022-09-16 DIAGNOSIS — Z923 Personal history of irradiation: Secondary | ICD-10-CM | POA: Diagnosis not present

## 2022-09-16 DIAGNOSIS — D500 Iron deficiency anemia secondary to blood loss (chronic): Secondary | ICD-10-CM

## 2022-09-16 DIAGNOSIS — I2699 Other pulmonary embolism without acute cor pulmonale: Secondary | ICD-10-CM | POA: Insufficient documentation

## 2022-09-16 DIAGNOSIS — Z9221 Personal history of antineoplastic chemotherapy: Secondary | ICD-10-CM | POA: Diagnosis not present

## 2022-09-16 DIAGNOSIS — I5023 Acute on chronic systolic (congestive) heart failure: Secondary | ICD-10-CM

## 2022-09-16 LAB — CBC WITH DIFFERENTIAL (CANCER CENTER ONLY)
Abs Immature Granulocytes: 0.01 10*3/uL (ref 0.00–0.07)
Basophils Absolute: 0 10*3/uL (ref 0.0–0.1)
Basophils Relative: 0 %
Eosinophils Absolute: 0.1 10*3/uL (ref 0.0–0.5)
Eosinophils Relative: 1 %
HCT: 40.2 % (ref 39.0–52.0)
Hemoglobin: 12.5 g/dL — ABNORMAL LOW (ref 13.0–17.0)
Immature Granulocytes: 0 %
Lymphocytes Relative: 15 %
Lymphs Abs: 0.9 10*3/uL (ref 0.7–4.0)
MCH: 27.3 pg (ref 26.0–34.0)
MCHC: 31.1 g/dL (ref 30.0–36.0)
MCV: 87.8 fL (ref 80.0–100.0)
Monocytes Absolute: 0.9 10*3/uL (ref 0.1–1.0)
Monocytes Relative: 14 %
Neutro Abs: 4.4 10*3/uL (ref 1.7–7.7)
Neutrophils Relative %: 70 %
Platelet Count: 225 10*3/uL (ref 150–400)
RBC: 4.58 MIL/uL (ref 4.22–5.81)
RDW: 13.9 % (ref 11.5–15.5)
WBC Count: 6.4 10*3/uL (ref 4.0–10.5)
nRBC: 0 % (ref 0.0–0.2)

## 2022-09-16 LAB — CMP (CANCER CENTER ONLY)
ALT: 7 U/L (ref 0–44)
AST: 12 U/L — ABNORMAL LOW (ref 15–41)
Albumin: 4 g/dL (ref 3.5–5.0)
Alkaline Phosphatase: 27 U/L — ABNORMAL LOW (ref 38–126)
Anion gap: 8 (ref 5–15)
BUN: 15 mg/dL (ref 8–23)
CO2: 32 mmol/L (ref 22–32)
Calcium: 10.1 mg/dL (ref 8.9–10.3)
Chloride: 100 mmol/L (ref 98–111)
Creatinine: 1.35 mg/dL — ABNORMAL HIGH (ref 0.61–1.24)
GFR, Estimated: 56 mL/min — ABNORMAL LOW (ref >60)
Glucose, Bld: 126 mg/dL — ABNORMAL HIGH (ref 70–99)
Potassium: 4.9 mmol/L (ref 3.5–5.1)
Sodium: 140 mmol/L (ref 135–145)
Total Bilirubin: 0.3 mg/dL (ref 0.3–1.2)
Total Protein: 7.4 g/dL (ref 6.5–8.1)

## 2022-09-16 LAB — FERRITIN: Ferritin: 31 ng/mL (ref 24–336)

## 2022-09-16 NOTE — Progress Notes (Signed)
Hematology and Oncology Follow Up Visit  Collin Young [id number] 11-20-1950 72 y.o. 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  Collin Young is back for followup.  We last saw him back in March 2023.  Since then, he has been doing pretty well.  He has had no real problems.  He has had no issues with his heart.  He says he saw his cardiologist in December.  He did have a upper endoscopy that was done back in April 2023.  This because he had melena and heme positive stools.  He really was not anemic.  The upper endoscopy showed some irritation and friability in the gastric antrum.  Biopsies were taken.  The pathology showed some intestinal metaplasia.  I would suspect that he is can be followed up with Gastroenterology.  He is eating well.  He is having no problems with nausea or vomiting.  He has had no obvious change in bowel or bladder habits.  He has had no problems with weight loss or weight gain.  He does have history of prostate cancer.  To no surprise, he does not follow-up with anybody for his prostate cancer.  We are checking his PSA today.  His last TSH back in March 2023 was 3.4. He has had no problems with COVID or Influenza.  He is on Eliquis.  He is doing well with the Eliquis.  Overall, I would say his performance status is probably ECOG 1.     Medications:  Current Outpatient Medications:    BAYER LOW DOSE 81 MG EC tablet, Take 81 mg by mouth daily. Swallow whole., Disp: , Rfl:    CORLANOR 5 MG TABS tablet, TAKE 1/2 TABLET(2.5 MG) BY MOUTH TWICE DAILY WITH A MEAL, Disp: 45 tablet, Rfl: 3   dapagliflozin  propanediol (FARXIGA) 10 MG TABS tablet, Take 1 tablet (10 mg total) by mouth daily., Disp: 90 tablet, Rfl: 3   ELIQUIS 5 MG TABS tablet, TAKE 1 TABLET(5 MG) BY MOUTH TWICE DAILY, Disp: 60 tablet, Rfl: 6   ENTRESTO 24-26 MG, TAKE 1 TABLET BY MOUTH TWICE DAILY, Disp: 60 tablet, Rfl: 11   Evolocumab (REPATHA SURECLICK) 720 MG/ML SOAJ, Inject 140 mg into the skin every 14 (fourteen) days., Disp: 2 mL, Rfl: 11   fentaNYL (DURAGESIC) 12 MCG/HR, Place 1 patch onto the skin every 3 (three) days., Disp: 10 patch, Rfl: 0   hydrALAZINE (APRESOLINE) 25 MG tablet, Take by mouth., Disp: , Rfl:    pantoprazole (PROTONIX) 40 MG tablet, TAKE 1 TABLET(40 MG) BY MOUTH DAILY, Disp: 30 tablet, Rfl: 3   pravastatin (PRAVACHOL) 80 MG tablet, TAKE 1 TABLET(80 MG) BY MOUTH EVERY EVENING, Disp: 90 tablet, Rfl: 3   albuterol (VENTOLIN HFA) 108 (90 Base) MCG/ACT inhaler, INHALE 2 PUFFS INTO THE LUNGS EVERY 6 HOURS AS NEEDED FOR WHEEZING OR SHORTNESS OF BREATH (Patient not taking: Reported on 09/16/2022), Disp: 20.1 g, Rfl: 1   furosemide (LASIX) 40 MG tablet, Take 1 tablet (40 mg total) by mouth as needed. (Patient not taking: Reported on 09/16/2022), Disp: 30 tablet, Rfl: 3   ipratropium (ATROVENT) 0.03 % nasal spray, Place 2 sprays  into both nostrils every 12 (twelve) hours. (Patient not taking: Reported on 09/16/2022), Disp: 30 mL, Rfl: 12   polyethylene glycol (MIRALAX / GLYCOLAX) 17 g packet, Take 17 g by mouth daily as needed for mild constipation. (Patient not taking: Reported on 09/16/2022), Disp: 14 each, Rfl: 0   sildenafil (VIAGRA) 50 MG tablet, Take 1 tablet (50 mg total) by mouth daily as needed for erectile dysfunction. (Patient not taking: Reported on 09/16/2022), Disp: 10 tablet, Rfl: 0   spironolactone (ALDACTONE) 25 MG tablet, Take 1 tablet (25 mg total) by mouth daily. (Patient not taking: Reported on 09/16/2022), Disp: 30 tablet, Rfl: 8 No current facility-administered medications for this  visit.  Facility-Administered Medications Ordered in Other Visits:    topical emolient (BIAFINE) emulsion, , Topical, Daily, Kyung Rudd, MD, Given at 01/19/14 0912  Allergies: No Known Allergies  Past Medical History, Surgical history, Social history, and Family History were reviewed and updated.  Review of Systems: Review of Systems  Constitutional: Negative.   HENT: Negative.    Eyes: Negative.   Respiratory: Negative.    Cardiovascular: Negative.   Gastrointestinal: Negative.   Genitourinary: Negative.   Musculoskeletal: Negative.   Skin: Negative.   Neurological: Negative.   Endo/Heme/Allergies: Negative.   Psychiatric/Behavioral: Negative.       Physical Exam:  height is '5\' 6"'$  (1.676 m) and weight is 139 lb 12.8 oz (63.4 kg). His oral temperature is 98 F (36.7 C). His blood pressure is 101/50 (abnormal) and his pulse is 94. His respiration is 20 and oxygen saturation is 98%.   Physical Exam Vitals reviewed.  HENT:     Head: Normocephalic and atraumatic.  Eyes:     Pupils: Pupils are equal, round, and reactive to light.  Cardiovascular:     Rate and Rhythm: Normal rate and regular rhythm.     Heart sounds: Normal heart sounds.  Pulmonary:     Effort: Pulmonary effort is normal.     Breath sounds: Normal breath sounds.  Abdominal:     General: Bowel sounds are normal.     Palpations: Abdomen is soft.     Comments: Abdominal exam shows a soft abdomen.  Bowel sounds are present.  He has the healed G-tube opening.  There is no exudate.  He has no fluid wave.  There is no palpable liver or spleen tip.  Musculoskeletal:        General: No tenderness or deformity. Normal range of motion.     Cervical back: Normal range of motion.  Lymphadenopathy:     Cervical: No cervical adenopathy.  Skin:    General: Skin is warm and dry.     Findings: No erythema or rash.  Neurological:     Mental Status: He is alert and oriented to person, place, and time.  Psychiatric:         Behavior: Behavior normal.        Thought Content: Thought content normal.        Judgment: Judgment normal.     Lab Results  Component Value Date   WBC 6.4 09/16/2022   HGB 12.5 (L) 09/16/2022   HCT 40.2 09/16/2022   MCV 87.8 09/16/2022   PLT 225 09/16/2022     Chemistry      Component Value Date/Time   NA 140 09/16/2022 1452   NA 135 05/29/2020 1556   NA 139 07/30/2017 0926   NA 137 03/10/2017 1000   K 4.9 09/16/2022 1452   K 3.9 07/30/2017  0926   K 4.3 03/10/2017 1000   CL 100 09/16/2022 1452   CL 96 (L) 07/30/2017 0926   CO2 32 09/16/2022 1452   CO2 33 07/30/2017 0926   CO2 29 03/10/2017 1000   BUN 15 09/16/2022 1452   BUN 23 05/29/2020 1556   BUN 17 07/30/2017 0926   BUN 14.7 03/10/2017 1000   CREATININE 1.35 (H) 09/16/2022 1452   CREATININE 1.4 (H) 07/30/2017 0926   CREATININE 1.1 03/10/2017 1000      Component Value Date/Time   CALCIUM 10.1 09/16/2022 1452   CALCIUM 9.6 07/30/2017 0926   CALCIUM 10.0 03/10/2017 1000   ALKPHOS 27 (L) 09/16/2022 1452   ALKPHOS 49 07/30/2017 0926   ALKPHOS 51 03/10/2017 1000   AST 12 (L) 09/16/2022 1452   AST 23 03/10/2017 1000   ALT 7 09/16/2022 1452   ALT 31 07/30/2017 0926   ALT 18 03/10/2017 1000   BILITOT 0.3 09/16/2022 1452   BILITOT 0.41 03/10/2017 1000       Impression and Plan: Mr. Corsetti is 72 year old gentleman. He had a localized recurrence of carcinoma of the left pyriform sinus. He underwent concurrent chemotherapy and radiation therapy. He finished his treatment back in April of 2015.   He does look quite good.  Hopefully, his cardiac status is holding out.  I do not see any problems with respect to his esophageal cancer or the head neck cancer.  We will see what his PSA looks like.  We will plan to get him back in another 6 months.  He continues on the Eliquis for his pulmonary embolism.  At some point, we will probably repeat the CT angiogram and see if there is any residual  embolus.    1/17/20243:27 PM

## 2022-09-18 ENCOUNTER — Other Ambulatory Visit (HOSPITAL_COMMUNITY): Payer: Self-pay

## 2022-09-18 ENCOUNTER — Telehealth: Payer: Self-pay | Admitting: Family Medicine

## 2022-09-18 DIAGNOSIS — J449 Chronic obstructive pulmonary disease, unspecified: Secondary | ICD-10-CM

## 2022-09-18 MED ORDER — UMECLIDINIUM BROMIDE 62.5 MCG/ACT IN AEPB: 1.0000 | INHALATION_SPRAY | Freq: Every day | RESPIRATORY_TRACT | 6 refills | Status: DC

## 2022-09-18 NOTE — Telephone Encounter (Signed)
Called and spoke with his daughter who answered the phone.  She does think her down be interested in trying a medication for COPD, I called in an inhaler for him.  However advised we may need to try to figure out which prior to his insurance will approve-he does not need to run out and try to fill it right this moment  Meds ordered this encounter  Medications   umeclidinium bromide (INCRUSE ELLIPTA) 62.5 MCG/ACT AEPB    Sig: Inhale 1 puff into the lungs daily.    Dispense:  30 each    Refill:  6

## 2022-09-19 LAB — PSA, TOTAL AND FREE
PSA, Free Pct: 6.3 %
PSA, Free: 0.05 ng/mL
Prostate Specific Ag, Serum: 0.8 ng/mL (ref 0.0–4.0)

## 2022-09-22 ENCOUNTER — Encounter: Payer: Self-pay | Admitting: Hematology & Oncology

## 2022-10-02 ENCOUNTER — Other Ambulatory Visit: Payer: Self-pay

## 2022-10-02 DIAGNOSIS — C61 Malignant neoplasm of prostate: Secondary | ICD-10-CM

## 2022-10-02 DIAGNOSIS — C12 Malignant neoplasm of pyriform sinus: Secondary | ICD-10-CM

## 2022-10-02 MED ORDER — FENTANYL 12 MCG/HR TD PT72: 1.0000 | MEDICATED_PATCH | TRANSDERMAL | 0 refills | Status: DC

## 2022-10-05 ENCOUNTER — Other Ambulatory Visit: Payer: Self-pay | Admitting: Family Medicine

## 2022-10-05 DIAGNOSIS — R062 Wheezing: Secondary | ICD-10-CM

## 2022-11-04 ENCOUNTER — Other Ambulatory Visit: Payer: Self-pay | Admitting: Hematology & Oncology

## 2022-11-04 ENCOUNTER — Other Ambulatory Visit (HOSPITAL_BASED_OUTPATIENT_CLINIC_OR_DEPARTMENT_OTHER): Payer: Self-pay

## 2022-11-04 ENCOUNTER — Other Ambulatory Visit: Payer: Self-pay

## 2022-11-04 DIAGNOSIS — C61 Malignant neoplasm of prostate: Secondary | ICD-10-CM

## 2022-11-04 DIAGNOSIS — C12 Malignant neoplasm of pyriform sinus: Secondary | ICD-10-CM

## 2022-11-05 MED ORDER — FENTANYL 12 MCG/HR TD PT72: 1.0000 | MEDICATED_PATCH | TRANSDERMAL | 0 refills | Status: DC

## 2022-11-09 ENCOUNTER — Telehealth: Payer: Self-pay | Admitting: Family Medicine

## 2022-11-09 ENCOUNTER — Encounter: Payer: Self-pay | Admitting: Hematology & Oncology

## 2022-11-09 NOTE — Telephone Encounter (Signed)
Contacted Collin Young to schedule their annual wellness visit. Appointment made for 11/12/2022.  Collin Young; Care Guide [facility] Direct Dial: [phone]

## 2022-11-12 ENCOUNTER — Ambulatory Visit: Payer: 59

## 2022-11-23 ENCOUNTER — Other Ambulatory Visit (HOSPITAL_COMMUNITY): Payer: Self-pay | Admitting: Family Medicine

## 2022-11-30 ENCOUNTER — Other Ambulatory Visit (HOSPITAL_COMMUNITY): Payer: Self-pay | Admitting: Internal Medicine

## 2022-12-07 ENCOUNTER — Other Ambulatory Visit: Payer: Self-pay | Admitting: *Deleted

## 2022-12-07 DIAGNOSIS — C12 Malignant neoplasm of pyriform sinus: Secondary | ICD-10-CM

## 2022-12-07 DIAGNOSIS — C61 Malignant neoplasm of prostate: Secondary | ICD-10-CM

## 2022-12-07 MED ORDER — FENTANYL 12 MCG/HR TD PT72
1.0000 | MEDICATED_PATCH | TRANSDERMAL | 0 refills | Status: DC
Start: 2022-12-07 — End: 2022-12-30

## 2022-12-07 NOTE — Telephone Encounter (Signed)
Pt called with request for pain patch refill. Rx refill fwd to MD for approval.

## 2022-12-24 ENCOUNTER — Encounter (HOSPITAL_COMMUNITY): Payer: Self-pay | Admitting: Internal Medicine

## 2022-12-24 ENCOUNTER — Other Ambulatory Visit (HOSPITAL_COMMUNITY): Payer: Self-pay

## 2022-12-24 MED ORDER — ENTRESTO 24-26 MG PO TABS: 1.0000 | ORAL_TABLET | Freq: Two times a day (BID) | ORAL | 11 refills | Status: DC

## 2022-12-30 ENCOUNTER — Other Ambulatory Visit: Payer: Self-pay | Admitting: Hematology & Oncology

## 2022-12-30 DIAGNOSIS — C61 Malignant neoplasm of prostate: Secondary | ICD-10-CM

## 2022-12-30 DIAGNOSIS — C12 Malignant neoplasm of pyriform sinus: Secondary | ICD-10-CM

## 2022-12-30 MED ORDER — FENTANYL 12 MCG/HR TD PT72
1.0000 | MEDICATED_PATCH | TRANSDERMAL | 0 refills | Status: DC
Start: 2022-12-30 — End: 2023-02-03

## 2023-01-08 ENCOUNTER — Encounter (HOSPITAL_COMMUNITY): Payer: Self-pay | Admitting: Internal Medicine

## 2023-01-08 ENCOUNTER — Ambulatory Visit (HOSPITAL_COMMUNITY)
Admission: RE | Admit: 2023-01-08 | Discharge: 2023-01-08 | Disposition: A | Discharge status: Home / Self Care | Payer: 59 | Source: Ambulatory Visit | Attending: Internal Medicine | Admitting: Internal Medicine

## 2023-01-08 ENCOUNTER — Ambulatory Visit (HOSPITAL_BASED_OUTPATIENT_CLINIC_OR_DEPARTMENT_OTHER)
Admission: RE | Admit: 2023-01-08 | Discharge: 2023-01-08 | Disposition: A | Discharge status: Home / Self Care | Payer: 59 | Source: Ambulatory Visit | Attending: Internal Medicine | Admitting: Internal Medicine

## 2023-01-08 VITALS — BP 110/68 | HR 84 | Wt 133.0 lb

## 2023-01-08 DIAGNOSIS — I2699 Other pulmonary embolism without acute cor pulmonale: Secondary | ICD-10-CM | POA: Insufficient documentation

## 2023-01-08 DIAGNOSIS — E785 Hyperlipidemia, unspecified: Secondary | ICD-10-CM | POA: Diagnosis not present

## 2023-01-08 DIAGNOSIS — I255 Ischemic cardiomyopathy: Secondary | ICD-10-CM | POA: Diagnosis not present

## 2023-01-08 DIAGNOSIS — Z7984 Long term (current) use of oral hypoglycemic drugs: Secondary | ICD-10-CM | POA: Insufficient documentation

## 2023-01-08 DIAGNOSIS — I2510 Atherosclerotic heart disease of native coronary artery without angina pectoris: Secondary | ICD-10-CM | POA: Insufficient documentation

## 2023-01-08 DIAGNOSIS — Z8501 Personal history of malignant neoplasm of esophagus: Secondary | ICD-10-CM | POA: Insufficient documentation

## 2023-01-08 DIAGNOSIS — Z7901 Long term (current) use of anticoagulants: Secondary | ICD-10-CM | POA: Diagnosis not present

## 2023-01-08 DIAGNOSIS — Z9221 Personal history of antineoplastic chemotherapy: Secondary | ICD-10-CM | POA: Insufficient documentation

## 2023-01-08 DIAGNOSIS — N183 Chronic kidney disease, stage 3 unspecified: Secondary | ICD-10-CM | POA: Diagnosis not present

## 2023-01-08 DIAGNOSIS — I5042 Chronic combined systolic (congestive) and diastolic (congestive) heart failure: Secondary | ICD-10-CM

## 2023-01-08 DIAGNOSIS — I5040 Unspecified combined systolic (congestive) and diastolic (congestive) heart failure: Secondary | ICD-10-CM

## 2023-01-08 DIAGNOSIS — K219 Gastro-esophageal reflux disease without esophagitis: Secondary | ICD-10-CM | POA: Insufficient documentation

## 2023-01-08 DIAGNOSIS — J449 Chronic obstructive pulmonary disease, unspecified: Secondary | ICD-10-CM | POA: Insufficient documentation

## 2023-01-08 DIAGNOSIS — Z79899 Other long term (current) drug therapy: Secondary | ICD-10-CM | POA: Insufficient documentation

## 2023-01-08 DIAGNOSIS — N1831 Chronic kidney disease, stage 3a: Secondary | ICD-10-CM | POA: Diagnosis not present

## 2023-01-08 DIAGNOSIS — I130 Hypertensive heart and chronic kidney disease with heart failure and stage 1 through stage 4 chronic kidney disease, or unspecified chronic kidney disease: Secondary | ICD-10-CM | POA: Insufficient documentation

## 2023-01-08 DIAGNOSIS — Z85818 Personal history of malignant neoplasm of other sites of lip, oral cavity, and pharynx: Secondary | ICD-10-CM | POA: Diagnosis not present

## 2023-01-08 LAB — CBC
HCT: 37.6 % — ABNORMAL LOW (ref 39.0–52.0)
Hemoglobin: 11.2 g/dL — ABNORMAL LOW (ref 13.0–17.0)
MCH: 23.7 pg — ABNORMAL LOW (ref 26.0–34.0)
MCHC: 29.8 g/dL — ABNORMAL LOW (ref 30.0–36.0)
MCV: 79.5 fL — ABNORMAL LOW (ref 80.0–100.0)
Platelets: 256 10*3/uL (ref 150–400)
RBC: 4.73 MIL/uL (ref 4.22–5.81)
RDW: 15.8 % — ABNORMAL HIGH (ref 11.5–15.5)
WBC: 6 10*3/uL (ref 4.0–10.5)
nRBC: 0 % (ref 0.0–0.2)

## 2023-01-08 LAB — BASIC METABOLIC PANEL
Anion gap: 10 (ref 5–15)
BUN: 21 mg/dL (ref 8–23)
CO2: 27 mmol/L (ref 22–32)
Calcium: 9.1 mg/dL (ref 8.9–10.3)
Chloride: 97 mmol/L — ABNORMAL LOW (ref 98–111)
Creatinine, Ser: 1.23 mg/dL (ref 0.61–1.24)
GFR, Estimated: >60 mL/min (ref >60)
Glucose, Bld: 101 mg/dL — ABNORMAL HIGH (ref 70–99)
Potassium: 4.4 mmol/L (ref 3.5–5.1)
Sodium: 134 mmol/L — ABNORMAL LOW (ref 135–145)

## 2023-01-08 LAB — ECHOCARDIOGRAM COMPLETE
Calc EF: 37.6 %
P 1/2 time: 365 msec
S' Lateral: 4.3 cm
Single Plane A2C EF: 42.4 %
Single Plane A4C EF: 34.8 %

## 2023-01-08 NOTE — Progress Notes (Signed)
ADVANCED HF CLINIC NOTE PCP: Copland, Gwenlyn Found, MD HF Cardiologist: Dr. Gala Romney  HPI: Collin Young is a 72 y.o.male w/ chronic combined systolic and diastolic CHF/ ischemic CM, severe multivessel CAD treated medically, 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     HF dates back to 12/2016. Echo then w/ severely reduced LVEF 10-15%, RV mild-mod reduced, mod AI/MR/TR. LHC showed severe calcific disease in the proximal to mid RCA and chronic total occlusion of the proximal circumflex.There was consideration of RCA PCI with recommendations for medical management.    Admitted  9/22 with HF. Echo EF down 25-30%, RV not well visualized. Mod MR/AI. Chest CT showed  large right pleural effusion.  There was questionable PE on his CT but ultimately this was thought to be just artifact with poor opacification. Given w/ IV Lasix and discharged home on Entresto, Farxiga, Toprol XL and PRN lasix.    Admitted 06/11/21 with HF and acute hypoxic respiratory failure.Complicated by PE, pleural effusion, and cardiogenic shock. Diuresed with IV lasix.  Limited Echo EF 20%, global HK, RV normal.  RHC cardiogenic shock with R>L heart failure. GDMT titrated. Not felt to be a VAD candidate due to cachexia and RV failure. Discharged home, weight 121 lbs.    Admitted 07/07/21 with HF. Echo EF 25-30%, severe LV dysfunction, grade I DD, RV mildly reduced.  Diuresed with IV lasix and transitioned to po lasix 40 mg daily. Continued on GDMT, discharge weight 122 lbs.   Echo (2/23): EF 40-45%  Follow up 5/23, NYHA II, volume low. Lasix changed to PRN and Entresto decreased to 24/26.  Here for f/u/ Doing very well. Working PT. No CP. HJas some gas with eating but no angina. Stable DOE. No edema, orthopnea or PND.No problems with meds  Echo today 01/07/23 EF 35-40% inferior AK mild MR .Personally reviewed   Cardiac Studies Echo 2018 EF 15%.   Echo 2017-06-17 EF improved up to 45-50%, Mod AI. RV normal.   Echo 17-Jun-2020 EF down again, 35-40%. RV normal  Echo 10/22 EF 20%, global HK, RV mildly reduced Echo 11/22 EF 25-30%, global HK, RV mildly reduced. Echo 2/23: EF 40-45%  CMRI (12/22,) unable to quanify EF, LGE with prior infarct basal to mid inferior/inferolateral walls. LGE is greater than 50% transmural suggesting territory is not viable  Past Medical History:  Diagnosis Date   Chronic combined systolic and diastolic CHF (congestive heart failure) (HCC)    followed by dr t. Duke Salvia   COPD (chronic obstructive pulmonary disease) (HCC)    Coronary artery disease cardiologist-- dr Chilton Si   per cardiac cath 01-05-2017  chronic total occlusion pLCx with collaterals, 99% severe calcified prox. to mid RCA, otherwise mild to moderate CAD (medically managed)   Esophageal cancer, stage IIIB [facility]) oncologist-  dr ennever/  dr moody   dx 2010  SCC Stage IIIB completed chemoradiation;  localized recurrent left piriform sinus 01/ 2015,  completed concurrent chemoradiatoin 04/ 2015   GERD (gastroesophageal reflux disease)    09-07-2018   no issues since Gtube removed 06/ 2019   History of alcohol abuse    quit 2001   History of cancer chemotherapy    2010;   2015   History of radiation therapy    10-19-2013 to  12-05-2013 pyriform sinus 69.96 Gy/65fx;   Radiation completed 2010 for esophageal cancer   History of seizure 2001   alcohol withdrawal  HOH (hard of hearing)    Hyperlipidemia    Hypertension    Iron deficiency anemia due to chronic blood loss 11/14/2021   Ischemic cardiomyopathy 12/2016   01-03-2017  echo,  ef 10-15%/   echo 05-06-2017 EF improved to 45-50%   Prostate cancer [facility]) urologist-  dr ottelin/  oncologist-- dr Kathrynn Running   dx 05-27-2018--- Stage T1c,  Gleason 4+3,  PSA 9.3--  scheduled for brachytherapy 09-23-2017   Renal cyst, left    Current Outpatient Medications  Medication Sig Dispense Refill    albuterol (VENTOLIN HFA) 108 (90 Base) MCG/ACT inhaler Inhale 2 puffs into the lungs every 6 (six) hours as needed for wheezing or shortness of breath. 18 g 5   apixaban (ELIQUIS) 5 MG TABS tablet Take 1 tablet (5 mg total) by mouth 2 (two) times daily. NEEDS FOLLOW UP APPOINTMENT FOR ANYMORE REFILLS 60 tablet 2   BAYER LOW DOSE 81 MG EC tablet Take 81 mg by mouth daily. Swallow whole.     CORLANOR 5 MG TABS tablet TAKE 1/2 TABLET(2.5 MG) BY MOUTH TWICE DAILY WITH A MEAL 45 tablet 3   dapagliflozin propanediol (FARXIGA) 10 MG TABS tablet Take 1 tablet (10 mg total) by mouth daily. 90 tablet 3   Evolocumab (REPATHA SURECLICK) 140 MG/ML SOAJ Inject 140 mg into the skin every 14 (fourteen) days. 2 mL 11   fentaNYL (DURAGESIC) 12 MCG/HR Place 1 patch onto the skin every 3 (three) days. 10 patch 0   furosemide (LASIX) 40 MG tablet Take 1 tablet (40 mg total) by mouth as needed. 30 tablet 3   ipratropium (ATROVENT) 0.03 % nasal spray Place 2 sprays into both nostrils every 12 (twelve) hours. 30 mL 12   polyethylene glycol (MIRALAX / GLYCOLAX) 17 g packet Take 17 g by mouth daily as needed for mild constipation. 14 each 0   pravastatin (PRAVACHOL) 80 MG tablet TAKE 1 TABLET(80 MG) BY MOUTH EVERY EVENING 90 tablet 3   sacubitril-valsartan (ENTRESTO) 24-26 MG Take 1 tablet by mouth 2 (two) times daily. 60 tablet 11   spironolactone (ALDACTONE) 25 MG tablet Take 25 mg by mouth as needed.     umeclidinium bromide (INCRUSE ELLIPTA) 62.5 MCG/ACT AEPB Inhale 1 puff into the lungs daily. 30 each 6   No current facility-administered medications for this encounter.   Facility-Administered Medications Ordered in Other Encounters  Medication Dose Route Frequency Provider Last Rate Last Admin   topical emolient (BIAFINE) emulsion   Topical Daily Dorothy Puffer, MD   Given at 01/19/14 0912   No Known Allergies  Social History   Socioeconomic History   Marital status: Legally Separated    Spouse name: Not on file    Number of children: 2   Years of education: Not on file   Highest education level: Not on file  Occupational History   Occupation: retired  Tobacco Use   Smoking status: Former    Packs/day: 1.00    Years: 40.00    Additional pack years: 0.00    Total pack years: 40.00    Types: Cigarettes    Start date: 11/08/1960    Quit date: 08/31/2009    Years since quitting: 13.3   Smokeless tobacco: Former    Types: Chew    Quit date: 09/01/1999  Vaping Use   Vaping Use: Never used  Substance and Sexual Activity   Alcohol use: Not Currently    Alcohol/week: 0.0 standard drinks of alcohol    Comment: quit in 2001  Drug use: No    Comment: back in the day used cocaine,alcohol, marijuana   Sexual activity: Not Currently    Partners: Female    Birth control/protection: None  Other Topics Concern   Not on file  Social History Narrative   Not on file   Social Determinants of Health   Financial Resource Strain: Low Risk  (08/29/2021)   Overall Financial Resource Strain (CARDIA)    Difficulty of Paying Living Expenses: Not hard at all  Food Insecurity: No Food Insecurity (08/29/2021)   Hunger Vital Sign    Worried About Running Out of Food in the Last Year: Never true    Ran Out of Food in the Last Year: Never true  Transportation Needs: No Transportation Needs (07/17/2021)   PRAPARE - Administrator, Civil Service (Medical): No    Lack of Transportation (Non-Medical): No  Physical Activity: Insufficiently Active (08/29/2021)   Exercise Vital Sign    Days of Exercise per Week: 7 days    Minutes of Exercise per Session: 20 min  Stress: No Stress Concern Present (08/29/2021)   [employer] of Occupational Health - Occupational Stress Questionnaire    Feeling of Stress : Not at all  Social Connections: Moderately Integrated (08/29/2021)   Social Connection and Isolation Panel [NHANES]    Frequency of Communication with Friends and Family: More than three times a week     Frequency of Social Gatherings with Friends and Family: More than three times a week    Attends Religious Services: More than 4 times per year    Active Member of [employer] or Organizations: Yes    Attends Banker Meetings: More than 4 times per year    Marital Status: Separated  Intimate Partner Violence: Not At Risk (08/29/2021)   Humiliation, Afraid, Rape, and Kick questionnaire    Fear of Current or Ex-Partner: No    Emotionally Abused: No    Physically Abused: No    Sexually Abused: No   Family History  Problem Relation Age of Onset   Breast cancer Mother    Prostate cancer Neg Hx    Colon cancer Neg Hx    Pancreatic cancer Neg Hx    BP 110/68   Pulse 84   Wt 60.3 kg (133 lb)   SpO2 96%   BMI 21.47 kg/m   Wt Readings from Last 3 Encounters:  01/08/23 60.3 kg (133 lb)  09/16/22 63.4 kg (139 lb 12.8 oz)  09/02/22 63.3 kg (139 lb 9.6 oz)   PHYSICAL EXAM: General:  Elderly No resp difficulty. Hoarse HEENT: normal Neck: supple. no JVD. Carotids 2+ bilat; no bruits. No lymphadenopathy or thryomegaly appreciated. Cor: PMI nondisplaced. Regular rate & rhythm. Soft MR Lungs: clear Abdomen: soft, nontender, nondistended. No hepatosplenomegaly. No bruits or masses. Good bowel sounds. Extremities: no cyanosis, clubbing, rash, edema Neuro: alert & orientedx3, cranial nerves grossly intact. moves all 4 extremities w/o difficulty. Affect pleasant   ASSESSMENT & PLAN:  1.  Chronic Systolic Heart Failure  - Echo (2018): EF 10-15%, RV moderately reduced. LHC w/ MVCAD management medically - Echo (9/21): EF down again, 35-40%. RV normal  - Echo (9/22): EF down 25-30%, RV not well visualized. Mod MR and Mod AI. - Limited Echo 10/22: EF 20%, RV normal  - RHC (06/16/21): w/ R>L HF and low output, CI 1.8. PAPi 1.3. - Echo (11/22): EF 25-30%, global HK, RV mildly reduced. - cMRI (12/22): unable to quanify EF, LGE with prior  infarct basal to mid inferior/inferolateral walls.  LGE is greater than 50% transmural suggesting territory is not viable. - Echo (2/23): EF improved 40-45%.  - Echo today 01/07/23 EF 35-40% inferior AK mild MR .Personally reviewed - Stable NYHA II Volume status looks good. Not needing lasix  - Continue ivabradine 2.5 mg bid.  - Continue Entresto 24/26 mg bid (send in Rx for 24/26, he has been taking 1/2 tab of 49/51). - Continue spiro 25 mg daily. - Continue Farxiga 10 mg daily. - Doing very well. Continue current meds - Check labs today   2. Pulmonary Embolism  - Diagnosed 10/22. - Suspect incidental due HF. - Echo today with no RV strain - Continue Eliquis 5 bid (can consider decreasing dose to 2.5 bid per AMPLIFY-ECT trial) - No bleeding   3. CKD Stage IIIa - Labs today   4. H/o esophageal CA and throat CA - s/p chemo and XRT.   Arvilla Meres, MD  10:36 AM

## 2023-01-08 NOTE — Patient Instructions (Signed)
No changes to your medications.  Labs done today, your results will be available in MyChart, we will contact you for abnormal readings.  Your physician recommends that you schedule a follow-up appointment in: 6 months ( November) ** please call the office in August to arrange your follow up appointment. **  If you have any questions or concerns before your next appointment please send [place] a message through [place] or call our office at [phone].    TO LEAVE A MESSAGE FOR THE NURSE SELECT OPTION 2, PLEASE LEAVE A MESSAGE INCLUDING: YOUR NAME DATE OF BIRTH CALL BACK NUMBER REASON FOR CALL**this is important as we prioritize the call backs  YOU WILL RECEIVE A CALL BACK THE SAME DAY AS LONG AS YOU CALL BEFORE 4:00 PM  At the [facility], you and your health needs are our priority. As part of our continuing mission to provide you with exceptional heart care, we have created designated Provider Care Teams. These Care Teams include your primary Cardiologist (physician) and Advanced Practice Providers (APPs- Physician Assistants and Nurse Practitioners) who all work together to provide you with the care you need, when you need it.   You may see any of the following providers on your designated Care Team at your next follow up: Dr Arvilla Meres Dr Marca Ancona Dr. Marcos Eke, NP Robbie Lis, [place] [facility], [place], [place] Brynda Peon, NP Karle Plumber, PharmD   Please be sure to bring in all your medications bottles to every appointment.    Thank you for choosing  [facility]

## 2023-01-08 NOTE — Progress Notes (Signed)
  Echocardiogram 2D Echocardiogram has been performed.  Collin Young 01/08/2023, 9:41 AM

## 2023-01-12 ENCOUNTER — Other Ambulatory Visit: Payer: Self-pay | Admitting: Cardiovascular Disease

## 2023-01-12 NOTE — Telephone Encounter (Signed)
Please call pt to schedule overdue follow-up visit with Dr. Duke Salvia or APP for refills. Last OV with Dr. Duke Salvia in 2022. Thank you!

## 2023-01-12 NOTE — Telephone Encounter (Signed)
Rx request sent to pharmacy.  

## 2023-01-12 NOTE — Telephone Encounter (Signed)
Scheduled Monday 02/01/23 with Gillian Shields, NP

## 2023-01-18 ENCOUNTER — Other Ambulatory Visit: Payer: Self-pay | Admitting: *Deleted

## 2023-01-18 MED ORDER — SPIRONOLACTONE 25 MG PO TABS: 25.0000 mg | ORAL_TABLET | ORAL | 3 refills | Status: DC | PRN

## 2023-02-01 ENCOUNTER — Encounter (HOSPITAL_BASED_OUTPATIENT_CLINIC_OR_DEPARTMENT_OTHER): Payer: Self-pay | Admitting: Family

## 2023-02-01 ENCOUNTER — Ambulatory Visit (INDEPENDENT_AMBULATORY_CARE_PROVIDER_SITE_OTHER): Payer: 59 | Admitting: Family

## 2023-02-01 VITALS — BP 154/90 | HR 89 | Ht 66.0 in | Wt 132.0 lb

## 2023-02-01 DIAGNOSIS — D6859 Other primary thrombophilia: Secondary | ICD-10-CM

## 2023-02-01 DIAGNOSIS — I2510 Atherosclerotic heart disease of native coronary artery without angina pectoris: Secondary | ICD-10-CM | POA: Diagnosis not present

## 2023-02-01 DIAGNOSIS — E782 Mixed hyperlipidemia: Secondary | ICD-10-CM

## 2023-02-01 DIAGNOSIS — I5040 Unspecified combined systolic (congestive) and diastolic (congestive) heart failure: Secondary | ICD-10-CM | POA: Diagnosis not present

## 2023-02-01 DIAGNOSIS — E785 Hyperlipidemia, unspecified: Secondary | ICD-10-CM

## 2023-02-01 DIAGNOSIS — Z8709 Personal history of other diseases of the respiratory system: Secondary | ICD-10-CM

## 2023-02-01 DIAGNOSIS — N183 Chronic kidney disease, stage 3 unspecified: Secondary | ICD-10-CM

## 2023-02-01 MED ORDER — REPATHA SURECLICK 140 MG/ML ~~LOC~~ SOAJ
140.0000 mg | SUBCUTANEOUS | 3 refills | Status: DC
Start: 2023-02-01 — End: 2024-04-06

## 2023-02-01 NOTE — Patient Instructions (Signed)
Medication Instructions:  Your physician recommends that you continue on your current medications as directed. Please refer to the Current Medication list given to you today.  *If you need a refill on your cardiac medications before your next appointment, please call your pharmacy*   Lab Work: Your physician recommends that you return for lab work today- Direct LDL, lipid panel, hepatic function panel  If you have labs (blood work) drawn today and your tests are completely normal, you will receive your results only by: MyChart Message (if you have MyChart) OR A paper copy in the mail If you have any lab test that is abnormal or we need to change your treatment, we will call you to review the results.   Follow-Up: At [facility], you and your health needs are our priority.  As part of our continuing mission to provide you with exceptional heart care, we have created designated Provider Care Teams.  These Care Teams include your primary Cardiologist (physician) and Advanced Practice Providers (APPs -  Physician Assistants and Nurse Practitioners) who all work together to provide you with the care you need, when you need it.  We recommend signing up for the patient portal called "MyChart".  Sign up information is provided on this After Visit Summary.  MyChart is used to connect with patients for Virtual Visits (Telemedicine).  Patients are able to view lab/test results, encounter notes, upcoming appointments, etc.  Non-urgent messages can be sent to your provider as well.   To learn more about what you can do with MyChart, go to [email or web address].    Your next appointment:   1 year(s)  Provider:   Chilton Si, MD or Gillian Shields, NP

## 2023-02-01 NOTE — Progress Notes (Signed)
Office Visit    Patient Name: Collin Young Date of Encounter: 02/07/2023  PCP:  Pearline Cables, MD   [facility]  Cardiologist:  Chilton Si, MD  Advanced Practice Provider:  Abelino Derrick, PA-C (Inactive) Electrophysiologist:  None      Chief Complaint    Collin Young is a 72 y.o. male presents today for heart failure and CAD follow-up  Past Medical History    Past Medical History:  Diagnosis Date   Chronic combined systolic and diastolic CHF (congestive heart failure) (HCC)    followed by dr t. Duke Salvia   COPD (chronic obstructive pulmonary disease) (HCC)    Coronary artery disease cardiologist-- dr Chilton Si   per cardiac cath 01-05-2017  chronic total occlusion pLCx with collaterals, 99% severe calcified prox. to mid RCA, otherwise mild to moderate CAD (medically managed)   Esophageal cancer, stage IIIB [facility]) oncologist-  dr ennever/  dr moody   dx 2010  SCC Stage IIIB completed chemoradiation;  localized recurrent left piriform sinus 01/ 2015,  completed concurrent chemoradiatoin 04/ 2015   GERD (gastroesophageal reflux disease)    09-07-2018   no issues since Gtube removed 06/ 2019   History of alcohol abuse    quit 2001   History of cancer chemotherapy    2010;   2015   History of radiation therapy    10-19-2013 to  12-05-2013 pyriform sinus 69.96 Gy/75fx;   Radiation completed 2010 for esophageal cancer   History of seizure 2001   alcohol withdrawal   HOH (hard of hearing)    Hyperlipidemia    Hypertension    Iron deficiency anemia due to chronic blood loss 11/14/2021   Ischemic cardiomyopathy 12/2016   01-03-2017  echo,  ef 10-15%/   echo 05-06-2017 EF improved to 45-50%   Prostate cancer [facility]) urologist-  dr ottelin/  oncologist-- dr Kathrynn Running   dx 05-27-2018--- Stage T1c,  Gleason 4+3,  PSA 9.3--  scheduled for brachytherapy 09-23-2017   Renal cyst, left    Past Surgical History:  Procedure Laterality Date    BIOPSY  12/18/2021   Procedure: BIOPSY;  Surgeon: Rachael Fee, MD;  Location: [facility];  Service: Gastroenterology;;   CARDIOVASCULAR STRESS TEST  10/09/09   normal nuclearr stress test, EF 57% [facility])   CENTRAL LINE INSERTION  06/16/2021   Procedure: CENTRAL LINE INSERTION;  Surgeon: Dolores Patty, MD;  Location: [facility] INVASIVE CV LAB;  Service: Cardiovascular;;   ESOPHAGOGASTRODUODENOSCOPY (EGD) WITH PROPOFOL N/A 12/18/2021   Procedure: ESOPHAGOGASTRODUODENOSCOPY (EGD) WITH PROPOFOL;  Surgeon: Rachael Fee, MD;  Location: [facility];  Service: Gastroenterology;  Laterality: N/A;   IR GASTROSTOMY TUBE MOD SED  01/13/2017   IR GASTROSTOMY TUBE REMOVAL  02/03/2018   IR PATIENT EVAL TECH 0-60 MINS  03/25/2017   IR REMOVAL TUN ACCESS W/ PORT W/O FL MOD SED  01/15/2017   IR REPLACE G-TUBE SIMPLE WO FLUORO  01/13/2018   LARYNGOSCOPY N/A 09/15/2013   Procedure: LARYNGOSCOPY;  Surgeon: Christia Reading, MD;  Location: [facility] OR;  Service: ENT;  Laterality: N/A;  direct laryngoscopy with biopsy and esophagoscopy   RADIOACTIVE SEED IMPLANT N/A 09/23/2018   Procedure: RADIOACTIVE SEED IMPLANT/BRACHYTHERAPY IMPLANT;  Surgeon: Ihor Gully, MD;  Location: [facility];  Service: Urology;  Laterality: N/A;   RIGHT HEART CATH N/A 06/16/2021   Procedure: RIGHT HEART CATH;  Surgeon: Dolores Patty, MD;  Location: [facility] INVASIVE CV LAB;  Service: Cardiovascular;  Laterality: N/A;   RIGHT/LEFT HEART CATH AND CORONARY ANGIOGRAPHY N/A 01/05/2017   Procedure: Right/Left Heart Cath and Coronary Angiography;  Surgeon: Kathleene Hazel, MD;  Location: [facility] INVASIVE CV LAB;  Service: Cardiovascular;  Laterality: N/A;   TRANSTHORACIC ECHOCARDIOGRAM  05/06/2017   ef 45-50%,  grade 1 diastolic dysfunction/  AV severe calcified non coronary cusp with moderate regurg. , no stenosis (valve area per echo 01-05-2017 1.08cm^2)/  mild MR, TR, and PR/  mild LAE    Allergies  No Known  Allergies  History of Present Illness    Collin Young is a 72 y.o. male with a hx of chronic combined systolic and diastolic heart failure / ischemic cardiomyopathy, severe multivessel CAD treated medically, esophageal and piriform sinus cancer s/p chemo & radiation, prostate cancer, CKDIIIa, COPD, HTN, HLD last seen by heart failure clinic 01/08/23.  Diagnosed with heart failure 12/2016 with echocardiogram revealing LVEF 10 to 15%, RV mild to moderately reduced, moderate AI/MR/TR.  LHC with severe calcific disease in proximal to mid RCA and CTO of proximal circumflex. Given frailty recommended for medical management.   Admitted 05/2021 with heart failure with echo revealing LVEF 25-40%, mod MR/AI. CT chest with large right pleural effusion.  Treated with IV Lasix and discharged on Entresto, procedure, Toprol, as needed Lasix.  Readmitted 05/2021 with heart failure complicated by PE, pleural effusion, cardiogenic shock. Echo LVEF 20%, hlobal HK, RV normal. RHC with R>L heart failure. Not VAD candidate due to cachexia and RV failure.   Admitted 07/07/21 with heart failure (EF 25-30%), diuresed with IV lasix and discharged weight 122 lbs.   Updated echo 10/2021 LVEF 40-45%. At visit 12/2021 Lasix changed to PRN and Entresto reduced to 24-26mg  BID.   Last seen 01/08/23 by heart failure clinic with echo LVEF 35-40%, inferior AK, mild MR. He was doing well and no changes made.   Presents today for follow up independently. Reports feeling well since last seen. 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 Stable mild dyspnea with more than usual activity.  Reports no chest pain, pressure, or tightness. No edema, orthopnea, PND. Reports no palpitations.     EKGs/Labs/Other Studies Reviewed:   The following studies were reviewed today: Cardiac Studies & Procedures   CARDIAC CATHETERIZATION  CARDIAC CATHETERIZATION 06/16/2021  Narrative Findings:  RA = 15 RV =  35/14 PA = 36/17 (25) PCW = 13 Fick  cardiac output/index = 2.9/1.8 Thermo CO/CI = 3.1/1.9 PVR = 4.0 WU SVR = 2,166 Ao sat = 99% PA sat = 55%, 56% PaPI = 1.3  Assessment:  Cardiogenic shock with R>L heart failure  Plan/Discussion:  Overall prognosis appears quite poor. Not candidate for advanced therapies. Will start inotropes and assess response.  Arvilla Meres, MD 1:21 PM   CARDIAC CATHETERIZATION  CARDIAC CATHETERIZATION 01/06/2017  Narrative  Prox RCA to Mid RCA lesion, 99 %stenosed.  Mid RCA lesion, 30 %stenosed.  Dist RCA lesion, 40 %stenosed.  RPDA lesion, 20 %stenosed.  Prox Cx lesion, 100 %stenosed.  Ost Ramus to Ramus lesion, 50 %stenosed.  Ost 3rd Diag to 3rd Diag lesion, 30 %stenosed.  Mid LAD lesion, 40 %stenosed.  Ost LAD to Prox LAD lesion, 20 %stenosed.  Ost LM to LM lesion, 40 %stenosed.  Hemodynamic findings consistent with mild pulmonary hypertension.  1. Severe calcific disease in the proximal to mid RCA 2. 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 Moderate left main and mid LAD stenosis 4. Elevated filling pressures  Recommendations: He has  multivessel CAD. I think the RCA can be approached with PCI but will likely require atherectomy prior to stenting. I would treat the remainder of his CAD medically. Will stage PCI given renal insufficiency. Hydrate gently tonight. May need diuresis tomorrow given elevated filling pressures. Would anticipate PCI on Thursday or Friday of this week if renal function remains stable.  Findings Coronary Findings Diagnostic  Dominance: Right  Left Main  Left Anterior Descending Vessel is large. The lesion is calcified. The lesion is calcified.  First Diagonal Branch Vessel is small in size.  First Septal Branch Vessel is small in size.  Second Diagonal Branch Vessel is small in size.  Second Septal Branch Vessel is small in size.  Third Diagonal Branch Vessel is moderate in size.  Third Septal Branch Vessel  is small in size.  Ramus Intermedius Vessel is moderate in size.  Left Circumflex The lesion is chronically occluded.  First Obtuse Marginal Branch Vessel is small in size.  Third Obtuse Marginal Branch Vessel is small in size. Collaterals 3rd Mrg filled by collaterals from 3rd Sept.  Right Coronary Artery The lesion is calcified.  Right Posterior Descending Artery  Intervention  No interventions have been documented.     ECHOCARDIOGRAM  ECHOCARDIOGRAM COMPLETE 01/08/2023  Narrative ECHOCARDIOGRAM REPORT    Patient Name:   Collin Young Date of Exam: 01/08/2023 Medical Rec #:  [id number]        Height:       66.0 in Accession #:    [id number]       Weight:       139.8 lb Date of Birth:  Jan 08, 1951        BSA:          1.717 m Patient Age:    72 years         BP:           101/62 mmHg Patient Gender: M                HR:           81 bpm. Exam Location:  Outpatient  Procedure: 2D Echo, Color Doppler and Cardiac Doppler  Indications:    congestive heart failure  History:        Patient has prior history of Echocardiogram examinations, most recent 10/02/2021. CHF and Cardiomyopathy, CAD, COPD; Risk Factors:Hypertension.  Sonographer:    Delcie Roch RDCS Referring Phys: [id number] DANIEL R BENSIMHON  IMPRESSIONS   1. Left ventricular ejection fraction, by estimation, is 35 to 40%. The left ventricle has moderately decreased function. The left ventricle demonstrates regional wall motion abnormalities (see scoring diagram/findings for description). Left ventricular diastolic parameters are consistent with Grade I diastolic dysfunction (impaired relaxation). There is akinesis of the left ventricular, entire inferior wall. 2. Right ventricular systolic function is normal. The right ventricular size is normal. There is normal pulmonary artery systolic pressure. 3. Left atrial size was mild to moderately dilated. 4. The mitral valve is degenerative. Mild mitral valve  regurgitation. No evidence of mitral stenosis. 5. The aortic valve is tricuspid. There is moderate calcification of the aortic valve. Aortic valve regurgitation is mild to moderate. Aortic valve sclerosis/calcification is present, without any evidence of aortic stenosis. 6. The inferior vena cava is normal in size with greater than 50% respiratory variability, suggesting right atrial pressure of 3 mmHg.  FINDINGS Left Ventricle: Left ventricular ejection fraction, by estimation, is 35 to 40%. The left ventricle has moderately decreased function. The left  ventricle demonstrates regional wall motion abnormalities. The left ventricular internal cavity size was normal in size. There is no left ventricular hypertrophy. Left ventricular diastolic parameters are consistent with Grade I diastolic dysfunction (impaired relaxation).  Right Ventricle: The right ventricular size is normal. No increase in right ventricular wall thickness. Right ventricular systolic function is normal. There is normal pulmonary artery systolic pressure. The tricuspid regurgitant velocity is 2.08 m/s, and with an assumed right atrial pressure of 3 mmHg, the estimated right ventricular systolic pressure is 20.3 mmHg.  Left Atrium: Left atrial size was mild to moderately dilated.  Right Atrium: Right atrial size was normal in size.  Pericardium: There is no evidence of pericardial effusion.  Mitral Valve: The mitral valve is degenerative in appearance. Mild mitral valve regurgitation. No evidence of mitral valve stenosis.  Tricuspid Valve: The tricuspid valve is normal in structure. Tricuspid valve regurgitation is mild . No evidence of tricuspid stenosis.  Aortic Valve: The aortic valve is tricuspid. There is moderate calcification of the aortic valve. Aortic valve regurgitation is mild to moderate. Aortic regurgitation PHT measures 365 msec. Aortic valve sclerosis/calcification is present, without any evidence of aortic  stenosis.  Pulmonic Valve: The pulmonic valve was normal in structure. Pulmonic valve regurgitation is trivial. No evidence of pulmonic stenosis.  Aorta: The aortic root is normal in size and structure.  Venous: The inferior vena cava is normal in size with greater than 50% respiratory variability, suggesting right atrial pressure of 3 mmHg.  IAS/Shunts: No atrial level shunt detected by color flow Doppler.   LEFT VENTRICLE PLAX 2D LVIDd:         4.90 cm      Diastology LVIDs:         4.30 cm      LV e' medial:  5.11 cm/s LV PW:         1.00 cm      LV e' lateral: 9.03 cm/s LV IVS:        1.00 cm LVOT diam:     2.10 cm LV SV:         64 LV SV Index:   37 LVOT Area:     3.46 cm  LV Volumes (MOD) LV vol d, MOD A2C: 125.0 ml LV vol d, MOD A4C: 130.0 ml LV vol s, MOD A2C: 72.0 ml LV vol s, MOD A4C: 84.8 ml LV SV MOD A2C:     53.0 ml LV SV MOD A4C:     130.0 ml LV SV MOD BP:      49.7 ml  RIGHT VENTRICLE             IVC RV Basal diam:  2.30 cm     IVC diam: 1.20 cm RV S prime:     11.00 cm/s TAPSE (M-mode): 1.7 cm  LEFT ATRIUM             Index        RIGHT ATRIUM           Index LA diam:        3.80 cm 2.21 cm/m   RA Area:     12.10 cm LA Vol (A2C):   53.7 ml 31.27 ml/m  RA Volume:   26.90 ml  15.66 ml/m LA Vol (A4C):   53.5 ml 31.15 ml/m LA Biplane Vol: 54.2 ml 31.56 ml/m AORTIC VALVE LVOT Vmax:   91.40 cm/s LVOT Vmean:  59.000 cm/s LVOT VTI:    0.184 m AI PHT:  365 msec  AORTA Ao Root diam: 3.10 cm  TRICUSPID VALVE TR Peak grad:   17.3 mmHg TR Vmax:        208.00 cm/s  SHUNTS Systemic VTI:  0.18 m Systemic Diam: 2.10 cm  Arvilla Meres MD Electronically signed by Arvilla Meres MD Signature Date/Time: 01/08/2023/10:28:21 AM    Final      CARDIAC MRI  MR CARDIAC MORPHOLOGY W WO CONTRAST 08/18/2021  Narrative CLINICAL DATA:  89M with chronic combined heart failure (EF 25-30% on echo), severe multivessel CAD  EXAM: CARDIAC  MRI  TECHNIQUE: The patient was scanned on a 1.5 Tesla Siemens magnet. A dedicated cardiac coil was used. Functional imaging was done using Fiesta sequences. 2,3, and 4 chamber views were done to assess for RWMA's. Modified Simpson's rule using a short axis stack was used to calculate an ejection fraction on a dedicated work [employer]. The patient received 6 cc of Gadavist. After 10 minutes inversion recovery sequences were used to assess for infiltration and scar tissue.  CONTRAST:  6 cc  of Gadavist  FINDINGS: Left ventricle:  -Unable to quantify volumes due to artifact/gating issues, but visually severe LV dysfunction  -Elevated ECV (35%)  -Subendocardial LGE consistent with prior infarct in basal to mid inferior/inferolateral walls. LGE >50% transmural suggesting territory is not viable  Right ventricle: Unable to quantify volumes due to artifact/gating issues, but visually mild RV dysfunction  Left atrium: Normal size  Right atrium: Normal size  Mitral valve: Not well visualized  Aortic valve: Not well visualized. Appears at least moderate regurgitation  Tricuspid valve: Not well visualized  Pulmonic valve: Not well visualized  Aorta: Normal proximal ascending aorta  Pericardium: Normal  IMPRESSION: 1. Unable to quantify volumes/EF due to artifact/gating issues, but visually LV function is severely reduced and RV function is mildly reduced  2. Aortic regurgitation was not quantified, and difficult to assess visually due to artifact but appears at least moderate  3. Subendocardial late gadolinium enhancement consistent with prior infarct in basal to mid inferior/inferolateral walls. LGE is greater than 50% transmural suggesting territory is not viable   Electronically Signed By: Epifanio Lesches M.D. On: 08/18/2021 17:28          EKG:  EKG is ordered today.  The ekg ordered today demonstrates NSR 89 bpm with first degree AV  block. No acute St/T wave changes.   Recent Labs: 05/14/2022: B Natriuretic Peptide 730.7 01/08/2023: BUN 21; Creatinine, Ser 1.23; Hemoglobin 11.2; Platelets 256; Potassium 4.4; Sodium 134 02/01/2023: ALT 6  Recent Lipid Panel    Component Value Date/Time   CHOL 120 02/01/2023 1451   TRIG 48 02/01/2023 1451   HDL 76 02/01/2023 1451   CHOLHDL 1.6 02/01/2023 1451   LDLCALC 32 02/01/2023 1451   LDLDIRECT 38 02/01/2023 1451     Home Medications   Current Meds  Medication Sig   albuterol (VENTOLIN HFA) 108 (90 Base) MCG/ACT inhaler Inhale 2 puffs into the lungs every 6 (six) hours as needed for wheezing or shortness of breath.   apixaban (ELIQUIS) 5 MG TABS tablet Take 1 tablet (5 mg total) by mouth 2 (two) times daily. NEEDS FOLLOW UP APPOINTMENT FOR ANYMORE REFILLS   BAYER LOW DOSE 81 MG EC tablet Take 81 mg by mouth daily. Swallow whole.   CORLANOR 5 MG TABS tablet TAKE 1/2 TABLET(2.5 MG) BY MOUTH TWICE DAILY WITH A MEAL   dapagliflozin propanediol (FARXIGA) 10 MG TABS tablet Take 1 tablet (10 mg  total) by mouth daily.   furosemide (LASIX) 40 MG tablet Take 1 tablet (40 mg total) by mouth as needed.   ipratropium (ATROVENT) 0.03 % nasal spray Place 2 sprays into both nostrils every 12 (twelve) hours.   polyethylene glycol (MIRALAX / GLYCOLAX) 17 g packet Take 17 g by mouth daily as needed for mild constipation.   pravastatin (PRAVACHOL) 80 MG tablet TAKE 1 TABLET(80 MG) BY MOUTH EVERY EVENING   sacubitril-valsartan (ENTRESTO) 24-26 MG Take 1 tablet by mouth 2 (two) times daily.   spironolactone (ALDACTONE) 25 MG tablet Take 1 tablet (25 mg total) by mouth as needed.   umeclidinium bromide (INCRUSE ELLIPTA) 62.5 MCG/ACT AEPB Inhale 1 puff into the lungs daily.   [DISCONTINUED] Evolocumab (REPATHA SURECLICK) 140 MG/ML SOAJ Inject 140 mg into the skin every 14 (fourteen) days. Please keep your upcoming appointment for refills.   [DISCONTINUED] fentaNYL (DURAGESIC) 12 MCG/HR Place 1 patch  onto the skin every 3 (three) days.     Review of Systems      All other systems reviewed and are otherwise negative except as noted above.  Physical Exam    VS:  BP (!) 154/90   Pulse 89   Ht 5\' 6"  (1.676 m)   Wt 132 lb (59.9 kg)   BMI 21.31 kg/m  , BMI Body mass index is 21.31 kg/m.  Wt Readings from Last 3 Encounters:  02/01/23 132 lb (59.9 kg)  01/08/23 133 lb (60.3 kg)  09/16/22 139 lb 12.8 oz (63.4 kg)     GEN: Well nourished, well developed, in no acute distress. HEENT: normal. Neck: Supple, no JVD, carotid bruits, or masses. Cardiac: RRR, no murmurs, rubs, or gallops. No clubbing, cyanosis, edema.  Radials/PT 2+ and equal bilaterally.  Respiratory:  Respirations regular and unlabored, clear to auscultation bilaterally. GI: Soft, nontender, nondistended. MS: No deformity or atrophy. Skin: Warm and dry, no rash. Neuro:  Strength and sensation are intact. Psych: Normal affect.  Assessment & Plan    Chronic systolic and diastolic heart failure / MR / AI - Also follows with Heart Failure Clinic. Euvolemic and well compensated on exam. GDMT Ivabradine 2.5mg  BID, Entresto 24-26mg  BID, Spironolactone 25mg  QD, Farxiga 10mg  QD. Low sodium diet, fluid restriction <2L, and daily weights encouraged. Educated to contact our office for weight gain of 2 lbs overnight or 5 lbs in one week.   CAD / HLD, LDL goal <70 - 100% LCx nd 99% RCA stenosis. RCA would require hemodynamic support. Consider only for refractory symptoms of ischemia or heart failure. Stable with no anginal symptoms. No indication for ischemic evaluation.  GDMT aspirin, farxiga, repatha, pravastatin. Heart healthy diet and regular cardiovascular exercise encouraged.  Direct LDL, lipid panel, LFTs today.   Hx of PE / Hypercoagulable state - Diagnosed 05/2021. Continue Eliquis 5mg  BID. Denies bleeding complications.   CKDIIIa - CBC with no evidence of anemia nor infection.        Disposition: Follow up in 1  year(s) with Chilton Si, MD or APP.  Signed, Alver Sorrow, NP 02/07/2023, 3:24 PM [facility]

## 2023-02-02 LAB — LIPID PANEL
Chol/HDL Ratio: 1.6 ratio (ref 0.0–5.0)
Cholesterol, Total: 120 mg/dL (ref 100–199)
HDL: 76 mg/dL (ref >39)
LDL Chol Calc (NIH): 32 mg/dL (ref 0–99)
Triglycerides: 48 mg/dL (ref 0–149)
VLDL Cholesterol Cal: 12 mg/dL (ref 5–40)

## 2023-02-02 LAB — HEPATIC FUNCTION PANEL
ALT: 6 IU/L (ref 0–44)
AST: 13 IU/L (ref 0–40)
Albumin: 4.4 g/dL (ref 3.8–4.8)
Alkaline Phosphatase: 47 IU/L (ref 44–121)
Bilirubin Total: <0.2 mg/dL (ref 0.0–1.2)
Bilirubin, Direct: <0.1 mg/dL (ref 0.00–0.40)
Total Protein: 7.6 g/dL (ref 6.0–8.5)

## 2023-02-02 LAB — LDL CHOLESTEROL, DIRECT: LDL Direct: 38 mg/dL (ref 0–99)

## 2023-02-03 ENCOUNTER — Other Ambulatory Visit: Payer: Self-pay | Admitting: Hematology & Oncology

## 2023-02-03 ENCOUNTER — Other Ambulatory Visit (HOSPITAL_BASED_OUTPATIENT_CLINIC_OR_DEPARTMENT_OTHER): Payer: Self-pay

## 2023-02-03 ENCOUNTER — Other Ambulatory Visit: Payer: Self-pay

## 2023-02-03 DIAGNOSIS — C61 Malignant neoplasm of prostate: Secondary | ICD-10-CM

## 2023-02-03 DIAGNOSIS — C12 Malignant neoplasm of pyriform sinus: Secondary | ICD-10-CM

## 2023-02-03 MED ORDER — FENTANYL 12 MCG/HR TD PT72
1.00 | MEDICATED_PATCH | TRANSDERMAL | 0 refills | Status: DC
Start: 2023-02-03 — End: 2023-03-05

## 2023-02-07 ENCOUNTER — Encounter (HOSPITAL_BASED_OUTPATIENT_CLINIC_OR_DEPARTMENT_OTHER): Payer: Self-pay | Admitting: Family

## 2023-03-01 ENCOUNTER — Other Ambulatory Visit (HOSPITAL_COMMUNITY): Payer: Self-pay | Admitting: Internal Medicine

## 2023-03-05 ENCOUNTER — Other Ambulatory Visit: Payer: Self-pay | Admitting: *Deleted

## 2023-03-05 DIAGNOSIS — C12 Malignant neoplasm of pyriform sinus: Secondary | ICD-10-CM

## 2023-03-05 DIAGNOSIS — C61 Malignant neoplasm of prostate: Secondary | ICD-10-CM

## 2023-03-05 MED ORDER — FENTANYL 12 MCG/HR TD PT72
1.0000 | MEDICATED_PATCH | TRANSDERMAL | 0 refills | Status: DC
Start: 2023-03-05 — End: 2023-04-05

## 2023-03-10 IMAGING — CT CT ANGIO CHEST
2 of 6 series · 18 of 36 positions shown · IV contrast (omnipaque)
Comparison: 01/10/2010

CLINICAL DATA: Chest pain, shortness of breath, elevated D-dimer

EXAM:
CT ANGIOGRAPHY CHEST WITH CONTRAST
TECHNIQUE: Multidetector CT imaging of the chest was performed using the
standard protocol during bolus administration of intravenous
contrast. Multiplanar CT image reconstructions and MIPs were
obtained to evaluate the vascular anatomy.
CONTRAST:  80mL OMNIPAQUE IOHEXOL 350 MG/ML SOLN

[Series 5: thins · axial · 0.71mm/px · z∈[-273,-17]mm · 17 of 288 slices shown]
[im 16/288  lung]
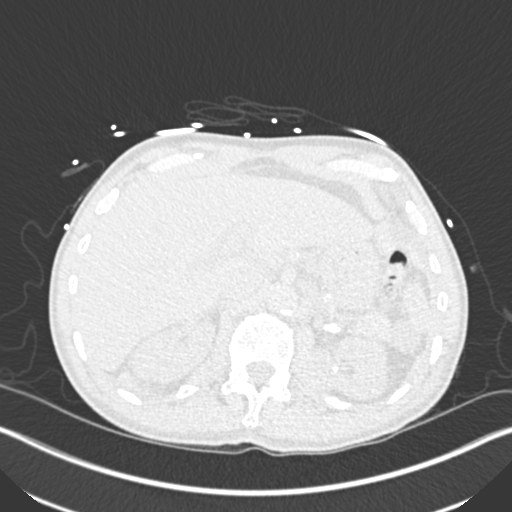
[im 32/288  mediastinal]
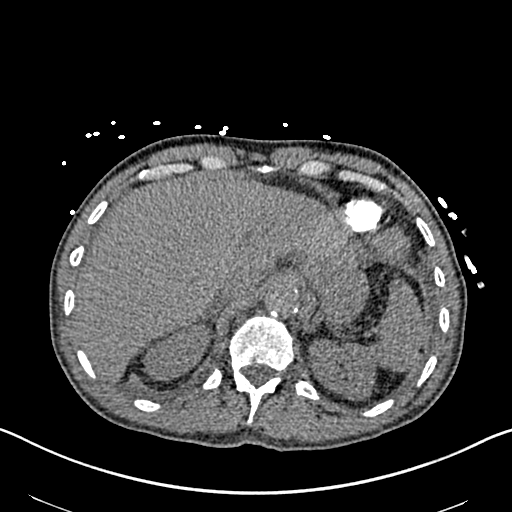
[im 48/288  lung]
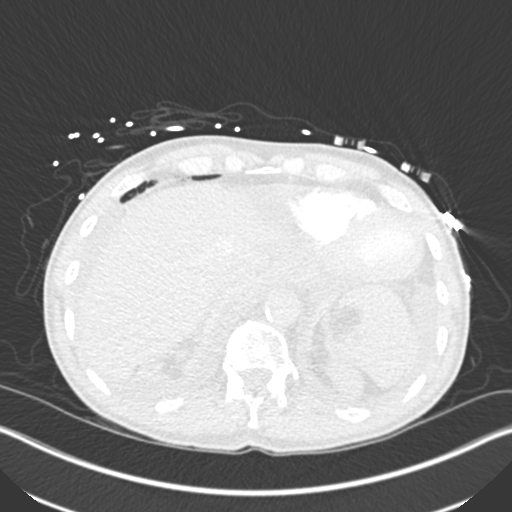
[im 64/288  mediastinal]
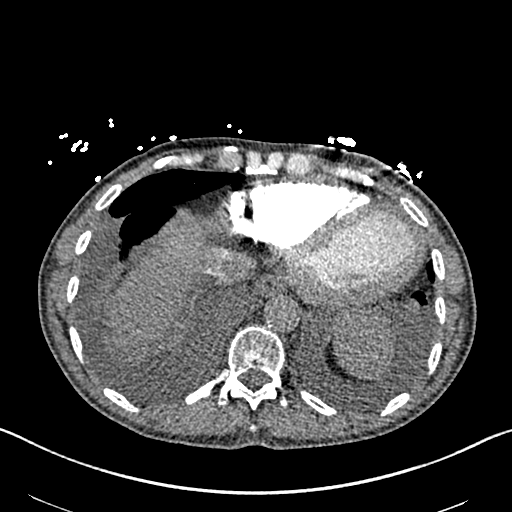
[im 80/288  lung]
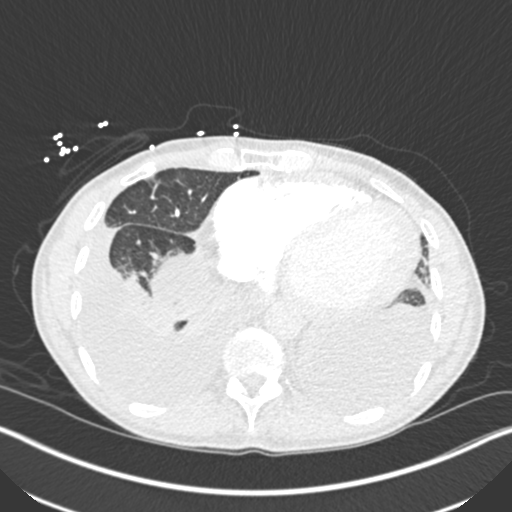
[im 96/288  mediastinal]
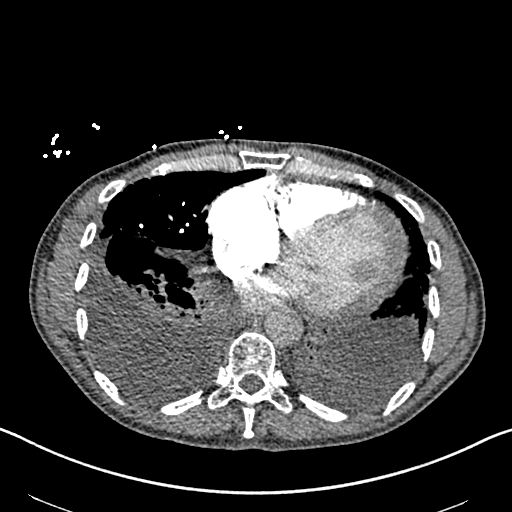
[im 112/288  lung]
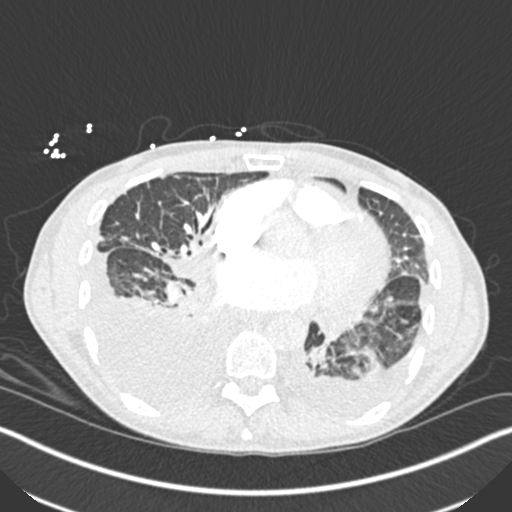
[im 128/288  mediastinal]
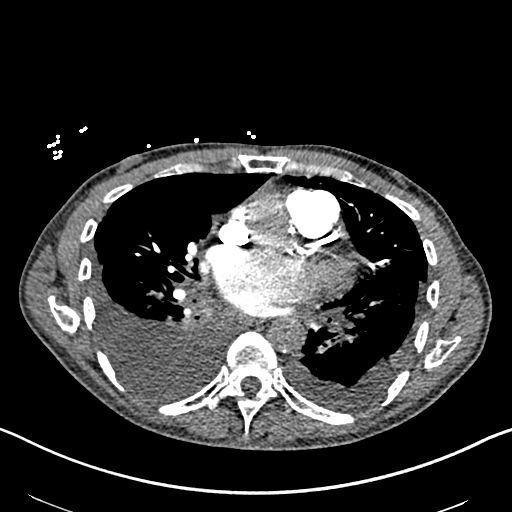
[im 144/288  lung]
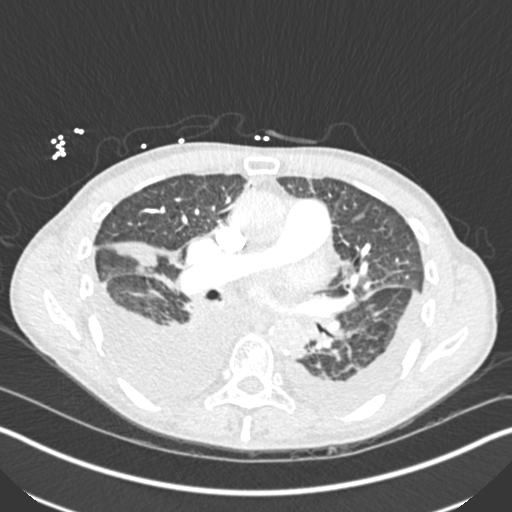
[im 160/288  mediastinal]
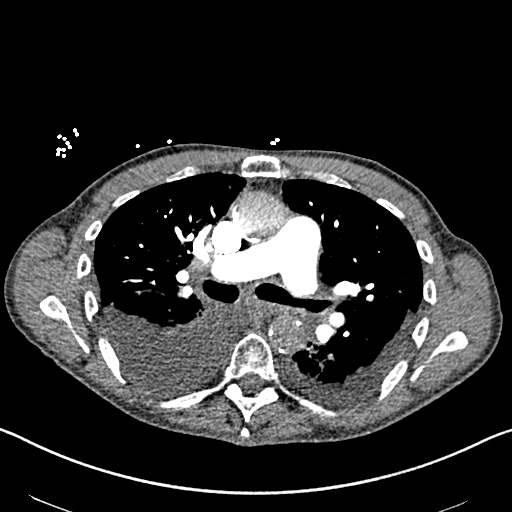
[im 176/288  lung]
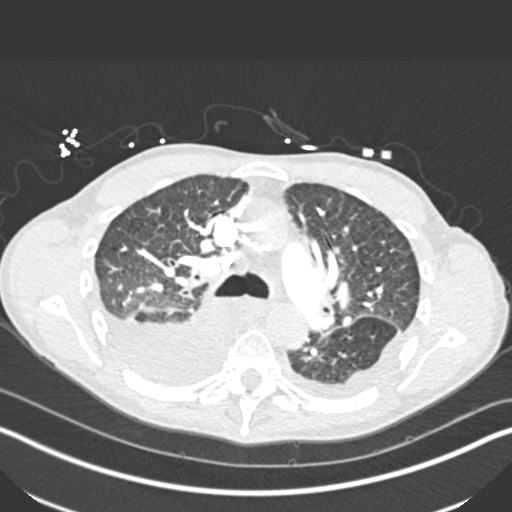
[im 192/288  mediastinal]
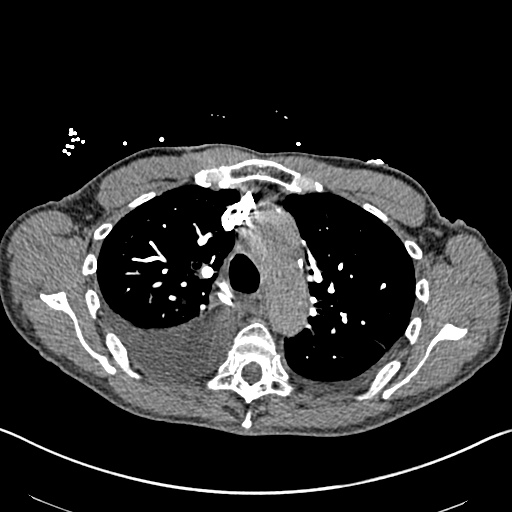
[im 208/288  lung]
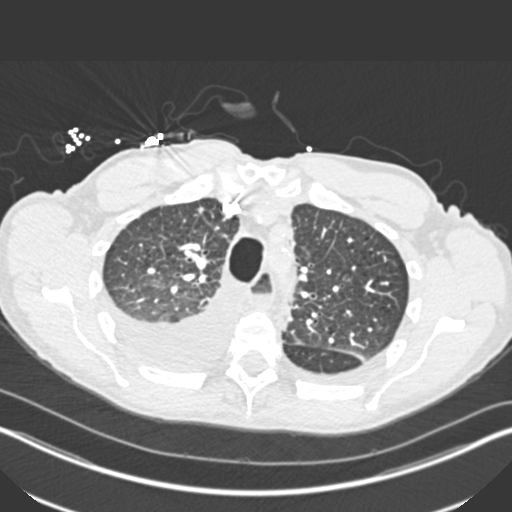
[im 224/288  mediastinal]
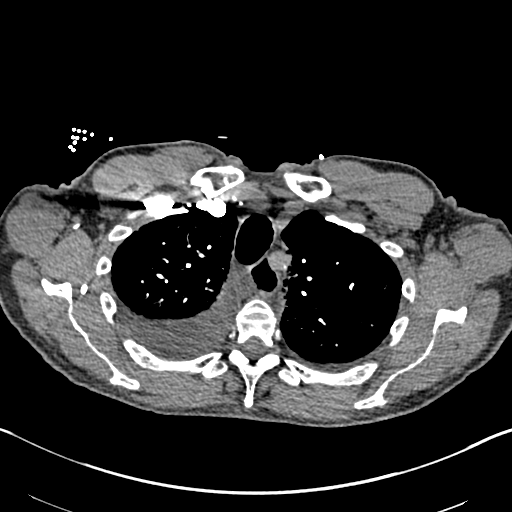
[im 240/288  lung]
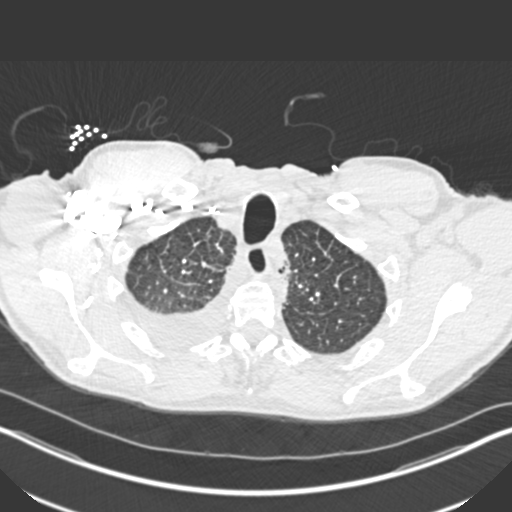
[im 256/288  mediastinal]
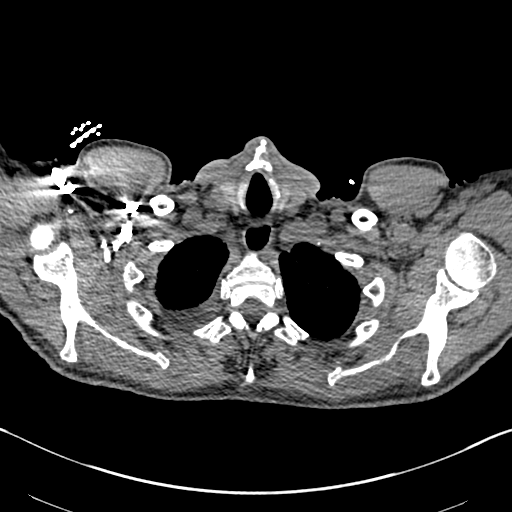
[im 272/288  lung]
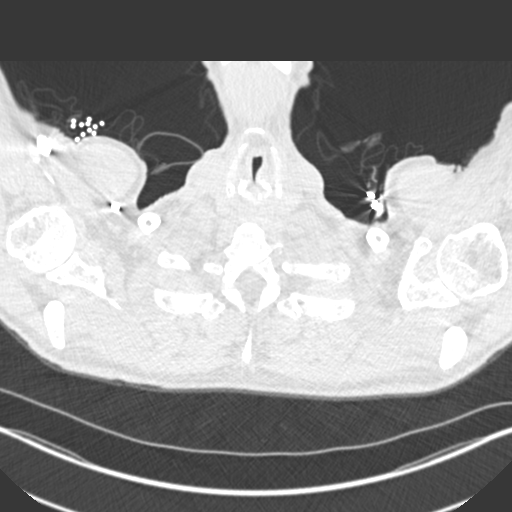

[Series 6: coronal mpr · coronal · 0.61mm/px · 1 of 112 slices shown]
[im 56/112  mediastinal]
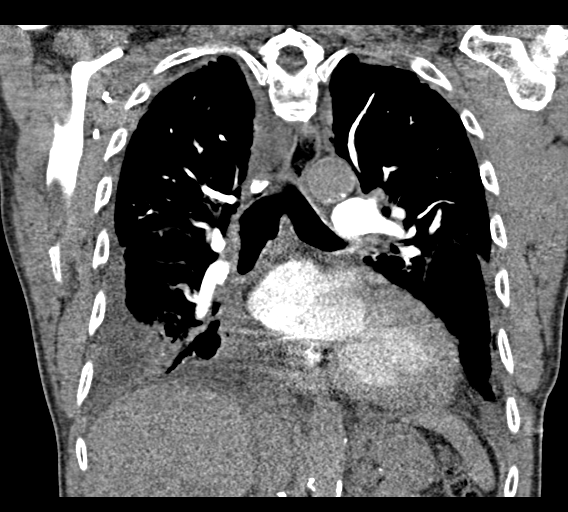

[18 of 36 positions shown; findings below may reference images not displayed]

FINDINGS: Cardiovascular: Cardiomegaly. Diffuse coronary artery and aortic
calcifications. No evidence of aortic aneurysm. Lack of contrast in
the lower lobe pulmonary arteries bilaterally both the common
unopacified at a similar level suggesting this may be related to
poor cardiac output rather than true pulmonary embolus.

Mediastinum/Nodes: No mediastinal, hilar, or axillary adenopathy.
Trachea and esophagus are unremarkable. Thyroid unremarkable.

Lungs/Pleura: Large right pleural effusion and small left pleural
effusion. Bilateral lower lobe airspace opacities could reflect
atelectasis or infiltrates/pneumonia.

Upper Abdomen: Imaging into the upper abdomen demonstrates no acute
findings.

Musculoskeletal: Chest wall soft tissues are unremarkable. No acute
bony abnormality.

Review of the MIP images confirms the above findings.
IMPRESSION: Unopacified lower lobe pulmonary arteries bilaterally. Given the
symmetric appearance in both lower lobes, this could be related to
poor cardiac output and unopacified vessels rather than true
pulmonary emboli. Pulmonary emboli however remain in the
differential.

Cardiomegaly.  Coronary artery disease.

Large right pleural effusion and small left pleural effusion.
Bilateral lower lobe atelectasis or infiltrate/pneumonia.

Aortic Atherosclerosis ([id number]-EEP.P).

## 2023-03-17 ENCOUNTER — Inpatient Hospital Stay: Payer: 59 | Admitting: Hematology & Oncology

## 2023-03-17 ENCOUNTER — Inpatient Hospital Stay: Payer: 59

## 2023-03-19 ENCOUNTER — Inpatient Hospital Stay: Payer: 59 | Admitting: Oncology

## 2023-03-19 ENCOUNTER — Inpatient Hospital Stay: Payer: 59

## 2023-03-19 ENCOUNTER — Other Ambulatory Visit: Payer: Self-pay

## 2023-03-19 ENCOUNTER — Encounter: Payer: Self-pay | Admitting: Oncology

## 2023-03-19 ENCOUNTER — Inpatient Hospital Stay: Payer: 59 | Attending: Hematology & Oncology

## 2023-03-19 ENCOUNTER — Inpatient Hospital Stay (HOSPITAL_BASED_OUTPATIENT_CLINIC_OR_DEPARTMENT_OTHER): Payer: 59 | Admitting: Oncology

## 2023-03-19 VITALS — BP 143/75 | HR 79 | Temp 98.0°F | Resp 19 | Ht 66.0 in | Wt 133.0 lb

## 2023-03-19 DIAGNOSIS — Z79899 Other long term (current) drug therapy: Secondary | ICD-10-CM | POA: Diagnosis not present

## 2023-03-19 DIAGNOSIS — Z79620 Long term (current) use of immunosuppressive biologic: Secondary | ICD-10-CM | POA: Insufficient documentation

## 2023-03-19 DIAGNOSIS — D500 Iron deficiency anemia secondary to blood loss (chronic): Secondary | ICD-10-CM | POA: Diagnosis not present

## 2023-03-19 DIAGNOSIS — Z8546 Personal history of malignant neoplasm of prostate: Secondary | ICD-10-CM | POA: Diagnosis not present

## 2023-03-19 DIAGNOSIS — C12 Malignant neoplasm of pyriform sinus: Secondary | ICD-10-CM | POA: Diagnosis not present

## 2023-03-19 DIAGNOSIS — Z9221 Personal history of antineoplastic chemotherapy: Secondary | ICD-10-CM | POA: Insufficient documentation

## 2023-03-19 DIAGNOSIS — Z86711 Personal history of pulmonary embolism: Secondary | ICD-10-CM | POA: Diagnosis not present

## 2023-03-19 DIAGNOSIS — C61 Malignant neoplasm of prostate: Secondary | ICD-10-CM | POA: Diagnosis not present

## 2023-03-19 DIAGNOSIS — I255 Ischemic cardiomyopathy: Secondary | ICD-10-CM

## 2023-03-19 DIAGNOSIS — Z923 Personal history of irradiation: Secondary | ICD-10-CM | POA: Insufficient documentation

## 2023-03-19 DIAGNOSIS — Z8522 Personal history of malignant neoplasm of nasal cavities, middle ear, and accessory sinuses: Secondary | ICD-10-CM | POA: Diagnosis not present

## 2023-03-19 DIAGNOSIS — D649 Anemia, unspecified: Secondary | ICD-10-CM | POA: Insufficient documentation

## 2023-03-19 LAB — CBC WITH DIFFERENTIAL (CANCER CENTER ONLY)
Abs Immature Granulocytes: 0.01 10*3/uL (ref 0.00–0.07)
Basophils Absolute: 0 10*3/uL (ref 0.0–0.1)
Basophils Relative: 0 %
Eosinophils Absolute: 0.1 10*3/uL (ref 0.0–0.5)
Eosinophils Relative: 2 %
HCT: 38.5 % — ABNORMAL LOW (ref 39.0–52.0)
Hemoglobin: 11.3 g/dL — ABNORMAL LOW (ref 13.0–17.0)
Immature Granulocytes: 0 %
Lymphocytes Relative: 20 %
Lymphs Abs: 1.4 10*3/uL (ref 0.7–4.0)
MCH: 23.2 pg — ABNORMAL LOW (ref 26.0–34.0)
MCHC: 29.4 g/dL — ABNORMAL LOW (ref 30.0–36.0)
MCV: 79.1 fL — ABNORMAL LOW (ref 80.0–100.0)
Monocytes Absolute: 0.8 10*3/uL (ref 0.1–1.0)
Monocytes Relative: 12 %
Neutro Abs: 4.4 10*3/uL (ref 1.7–7.7)
Neutrophils Relative %: 66 %
Platelet Count: 234 10*3/uL (ref 150–400)
RBC: 4.87 MIL/uL (ref 4.22–5.81)
RDW: 16.8 % — ABNORMAL HIGH (ref 11.5–15.5)
WBC Count: 6.7 10*3/uL (ref 4.0–10.5)
nRBC: 0 % (ref 0.0–0.2)

## 2023-03-19 LAB — CMP (CANCER CENTER ONLY)
ALT: 7 U/L (ref 0–44)
AST: 12 U/L — ABNORMAL LOW (ref 15–41)
Albumin: 4.1 g/dL (ref 3.5–5.0)
Alkaline Phosphatase: 33 U/L — ABNORMAL LOW (ref 38–126)
Anion gap: 8 (ref 5–15)
BUN: 22 mg/dL (ref 8–23)
CO2: 31 mmol/L (ref 22–32)
Calcium: 10 mg/dL (ref 8.9–10.3)
Chloride: 96 mmol/L — ABNORMAL LOW (ref 98–111)
Creatinine: 1.21 mg/dL (ref 0.61–1.24)
GFR, Estimated: >60 mL/min (ref >60)
Glucose, Bld: 135 mg/dL — ABNORMAL HIGH (ref 70–99)
Potassium: 4.3 mmol/L (ref 3.5–5.1)
Sodium: 135 mmol/L (ref 135–145)
Total Bilirubin: 0.2 mg/dL — ABNORMAL LOW (ref 0.3–1.2)
Total Protein: 7.5 g/dL (ref 6.5–8.1)

## 2023-03-19 LAB — TSH: TSH: 4.487 u[IU]/mL (ref 0.350–4.500)

## 2023-03-19 LAB — D-DIMER, QUANTITATIVE: D-Dimer, Quant: 0.29 ug/mL-FEU (ref 0.00–0.50)

## 2023-03-19 NOTE — Progress Notes (Signed)
Hematology and Oncology Follow Up Visit  Collin Young [id number] May 18, 1951 72 y.o. 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  We last saw him back in January 2024.  Since then, he has been doing pretty well.  He has had no real problems.  He has had no issues with his heart.  He says he saw his cardiologist in June.   He reports a good appetite. Weight is stable. He is having no problems with nausea or vomiting.  He has had no obvious change in bowel or bladder habits.    He does have history of prostate cancer.  PSA has remained normal. This is being rechecked today.   He is on Eliquis.  He is doing well with the Eliquis. No bleeding reported.   He is on a Fentanyl patch and pain is well-controlled.  Overall, I would say his performance status is probably ECOG 1.     Medications:  Current Outpatient Medications:    albuterol (VENTOLIN HFA) 108 (90 Base) MCG/ACT inhaler, Inhale 2 puffs into the lungs every 6 (six) hours as needed for wheezing or shortness of breath., Disp: 18 g, Rfl: 5   apixaban (ELIQUIS) 5 MG TABS tablet, Take 1 tablet (5 mg total) by mouth 2 (two) times daily., Disp: 60 tablet, Rfl: 5   BAYER LOW DOSE 81 MG EC tablet, Take 81 mg by mouth daily. Swallow whole., Disp: , Rfl:    CORLANOR 5 MG TABS tablet, TAKE 1/2 TABLET(2.5 MG) BY MOUTH TWICE DAILY WITH A MEAL, Disp: 45 tablet, Rfl: 3   dapagliflozin propanediol (FARXIGA) 10 MG TABS tablet, Take 1 tablet (10 mg total) by mouth daily., Disp: 90 tablet, Rfl: 3   Evolocumab (REPATHA SURECLICK) 140 MG/ML SOAJ, Inject 140 mg  into the skin every 14 (fourteen) days. Please keep your upcoming appointment for refills., Disp: 6 mL, Rfl: 3   fentaNYL (DURAGESIC) 12 MCG/HR, Place 1 patch onto the skin every 3 (three) days., Disp: 10 patch, Rfl: 0   furosemide (LASIX) 40 MG tablet, Take 1 tablet (40 mg total) by mouth as needed., Disp: 30 tablet, Rfl: 3   ipratropium (ATROVENT) 0.03 % nasal spray, Place 2 sprays into both nostrils every 12 (twelve) hours., Disp: 30 mL, Rfl: 12   polyethylene glycol (MIRALAX / GLYCOLAX) 17 g packet, Take 17 g by mouth daily as needed for mild constipation., Disp: 14 each, Rfl: 0   pravastatin (PRAVACHOL) 80 MG tablet, TAKE 1 TABLET(80 MG) BY MOUTH EVERY EVENING, Disp: 90 tablet, Rfl: 3   sacubitril-valsartan (ENTRESTO) 24-26 MG, Take 1 tablet by mouth 2 (two) times daily., Disp: 60 tablet, Rfl: 11   spironolactone (ALDACTONE) 25 MG tablet, Take 1 tablet (25 mg total) by mouth as needed., Disp: 90 tablet, Rfl: 3   umeclidinium bromide (INCRUSE ELLIPTA) 62.5 MCG/ACT AEPB, Inhale 1 puff into the lungs daily., Disp: 30 each, Rfl: 6 No current facility-administered medications for this visit.  Facility-Administered Medications Ordered in Other Visits:    topical emolient (BIAFINE) emulsion, , Topical, Daily, Dorothy Puffer, MD, Given at 01/19/14 0912  Allergies: No Known  Allergies  Past Medical History, Surgical history, Social history, and Family History were reviewed and updated.  Review of Systems: Review of Systems  Constitutional: Negative.   HENT: Negative.    Eyes: Negative.   Respiratory: Negative.    Cardiovascular: Negative.   Gastrointestinal: Negative.   Genitourinary: Negative.   Musculoskeletal: Negative.   Skin: Negative.   Neurological: Negative.   Endo/Heme/Allergies: Negative.   Psychiatric/Behavioral: Negative.     Physical Exam:  height is 5\' 6"  (1.676 m) and weight is 60.3 kg. His oral temperature is 98 F (36.7 C). His blood pressure is 143/75 (abnormal) and his  pulse is 79. His respiration is 19 and oxygen saturation is 99%.   Physical Exam Vitals reviewed.  HENT:     Head: Normocephalic and atraumatic.  Eyes:     Pupils: Pupils are equal, round, and reactive to light.  Cardiovascular:     Rate and Rhythm: Normal rate and regular rhythm.     Heart sounds: Normal heart sounds.  Pulmonary:     Effort: Pulmonary effort is normal.     Breath sounds: Normal breath sounds.  Abdominal:     General: Bowel sounds are normal.     Palpations: Abdomen is soft.     Comments: Abdominal exam shows a soft abdomen.  Bowel sounds are present.  He has the healed G-tube opening.  There is no exudate.  He has no fluid wave.  There is no palpable liver or spleen tip.  Musculoskeletal:        General: No tenderness or deformity. Normal range of motion.     Cervical back: Normal range of motion.  Lymphadenopathy:     Cervical: No cervical adenopathy.  Skin:    General: Skin is warm and dry.     Findings: No erythema or rash.  Neurological:     Mental Status: He is alert and oriented to person, place, and time.  Psychiatric:        Behavior: Behavior normal.        Thought Content: Thought content normal.        Judgment: Judgment normal.     Lab Results  Component Value Date   WBC 6.7 03/19/2023   HGB 11.3 (L) 03/19/2023   HCT 38.5 (L) 03/19/2023   MCV 79.1 (L) 03/19/2023   PLT 234 03/19/2023     Chemistry      Component Value Date/Time   NA 135 03/19/2023 1244   NA 135 05/29/2020 1556   NA 139 07/30/2017 0926   NA 137 03/10/2017 1000   K 4.3 03/19/2023 1244   K 3.9 07/30/2017 0926   K 4.3 03/10/2017 1000   CL 96 (L) 03/19/2023 1244   CL 96 (L) 07/30/2017 0926   CO2 31 03/19/2023 1244   CO2 33 07/30/2017 0926   CO2 29 03/10/2017 1000   BUN 22 03/19/2023 1244   BUN 23 05/29/2020 1556   BUN 17 07/30/2017 0926   BUN 14.7 03/10/2017 1000   CREATININE 1.21 03/19/2023 1244   CREATININE 1.4 (H) 07/30/2017 0926   CREATININE 1.1 03/10/2017  1000      Component Value Date/Time   CALCIUM 9.1 01/08/2023 1043   CALCIUM 9.6 07/30/2017 0926   CALCIUM 10.0 03/10/2017 1000   ALKPHOS 47 02/01/2023 1451   ALKPHOS 49 07/30/2017 0926   ALKPHOS 51 03/10/2017 1000   AST 13 02/01/2023 1451   AST 12 (L) 09/16/2022 1452   AST 23 03/10/2017 1000   ALT  6 02/01/2023 1451   ALT 7 09/16/2022 1452   ALT 31 07/30/2017 0926   ALT 18 03/10/2017 1000   BILITOT <0.2 02/01/2023 1451   BILITOT 0.3 09/16/2022 1452   BILITOT 0.41 03/10/2017 1000       Impression and Plan: Mr. Young is 72 year old gentleman. He had a localized recurrence of carcinoma of the left pyriform sinus. He underwent concurrent chemotherapy and radiation therapy. He finished his treatment back in April of 2015.   Clinically, he continues to do well. He will continue on observation.   He also has a history of prostate cancer. PSA pending today.   His CBC shows mild anemia. This is stable. Will continue to follow.   He will follow up in 6 months with repeat lab work. He will continue on Eliquis.   Clenton Pare 7/19/20241:21 PM

## 2023-03-20 LAB — PSA, TOTAL AND FREE
PSA, Free Pct: 4.5 %
PSA, Free: 0.05 ng/mL
Prostate Specific Ag, Serum: 1.1 ng/mL (ref 0.0–4.0)

## 2023-03-23 ENCOUNTER — Encounter: Payer: Self-pay | Admitting: Hematology & Oncology

## 2023-03-23 ENCOUNTER — Other Ambulatory Visit: Payer: Self-pay | Admitting: *Deleted

## 2023-03-23 DIAGNOSIS — E039 Hypothyroidism, unspecified: Secondary | ICD-10-CM

## 2023-04-05 ENCOUNTER — Other Ambulatory Visit: Payer: Self-pay

## 2023-04-05 ENCOUNTER — Other Ambulatory Visit (HOSPITAL_COMMUNITY): Payer: Self-pay

## 2023-04-05 ENCOUNTER — Other Ambulatory Visit: Payer: Self-pay | Admitting: Internal Medicine

## 2023-04-05 DIAGNOSIS — C12 Malignant neoplasm of pyriform sinus: Secondary | ICD-10-CM

## 2023-04-05 DIAGNOSIS — C61 Malignant neoplasm of prostate: Secondary | ICD-10-CM

## 2023-04-05 MED ORDER — FENTANYL 12 MCG/HR TD PT72
1.0000 | MEDICATED_PATCH | TRANSDERMAL | 0 refills | Status: DC
Start: 2023-04-05 — End: 2023-05-05

## 2023-04-05 MED ORDER — DAPAGLIFLOZIN PROPANEDIOL 10 MG PO TABS: 10.0000 mg | ORAL_TABLET | Freq: Every day | ORAL | 3 refills | Status: DC

## 2023-04-05 NOTE — Telephone Encounter (Signed)
Pt called in requesting a refill on his Fentanyl pain patches. Last refilled 03/05/23 #10 with 0 refills. Please advise, thanks!

## 2023-04-17 IMAGING — DX DG CHEST 1V
1 series · 1 of 1 positions shown · non-contrast
Comparison: 06/11/2021

CLINICAL DATA: New onset shortness of breath

EXAM:
PORTABLE CHEST 1 VIEW

[chest ap]
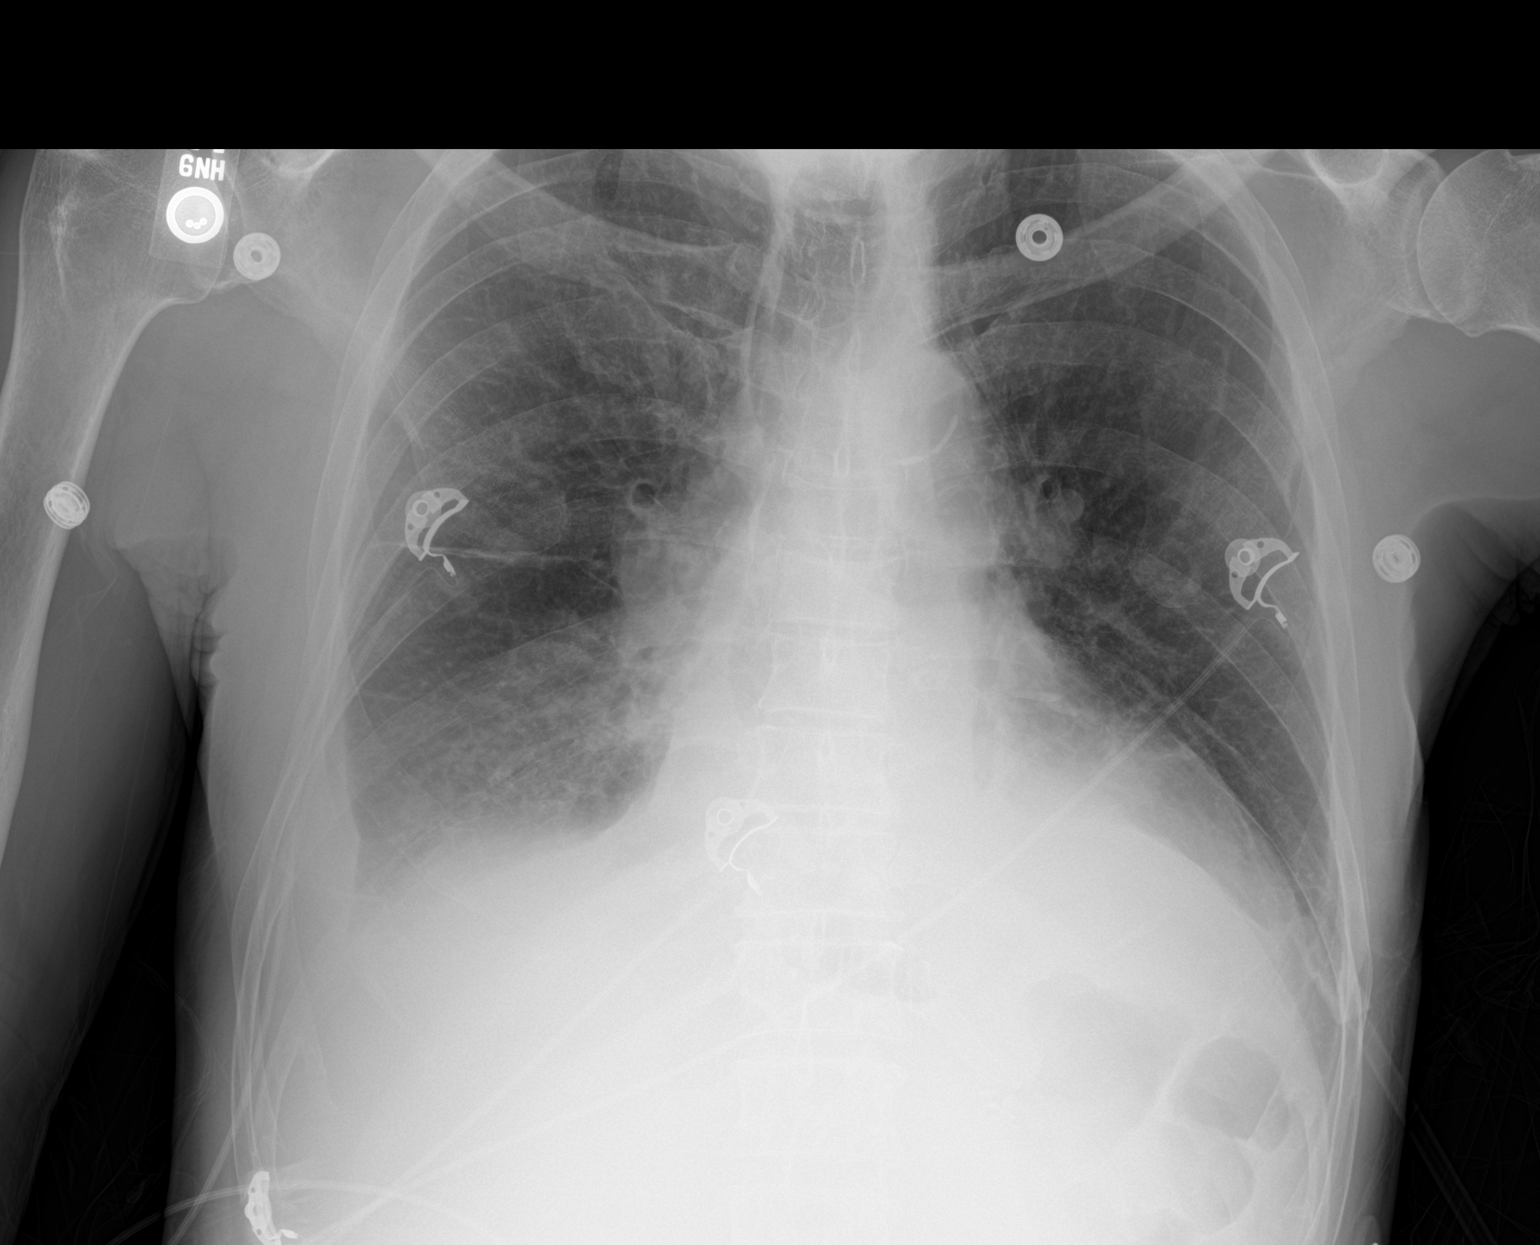

[1 of 1 positions shown; findings below may reference images not displayed]

FINDINGS: Cardiac shadow is enlarged but stable. Aortic calcifications are
noted. Recurrent right-sided pleural effusion is noted. No focal
infiltrate is seen. No bony abnormality is noted.
IMPRESSION: Recurrent small right-sided pleural effusion.

## 2023-04-18 IMAGING — US US HEPATIC LIVER DOPPLER
1 series · 15 of 25 positions shown · non-contrast
Comparison: 06/15/2021

CLINICAL DATA: Elevated LFTs

EXAM:
DUPLEX ULTRASOUND OF LIVER
TECHNIQUE: Color and duplex Doppler ultrasound was performed to evaluate the
hepatic in-flow and out-flow vessels.

[Series 1: us art/ven flow abd pelv doppl mc & wl · 15 of 68 slices shown]
[im 1/68]
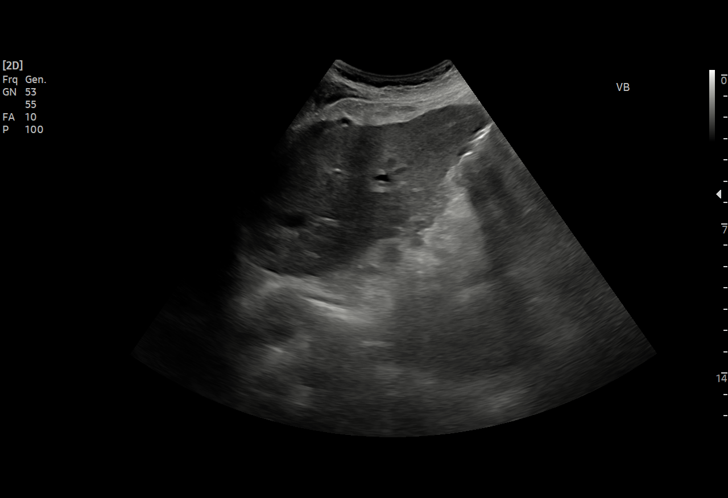
[im 6/68]
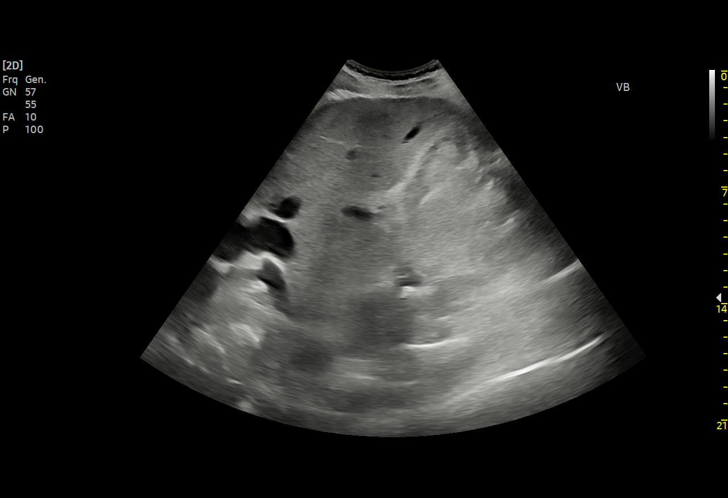
[im 12/68]
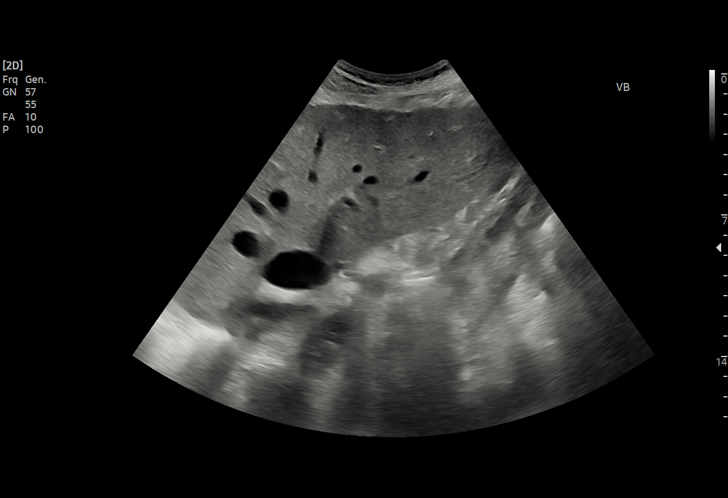
[im 14/68]
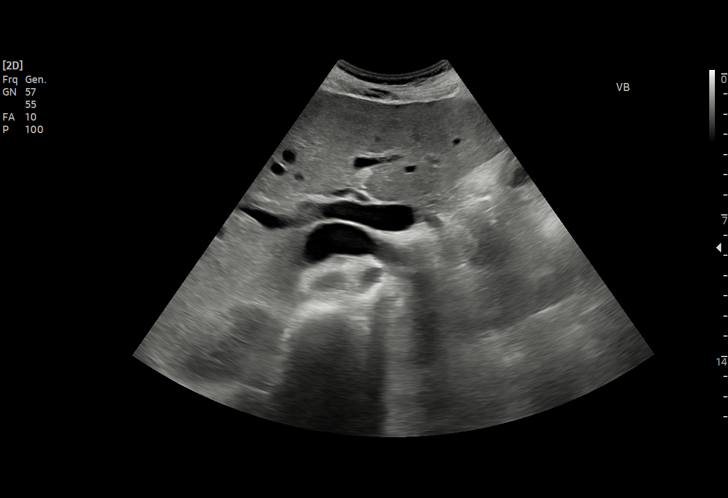
[im 20/68]
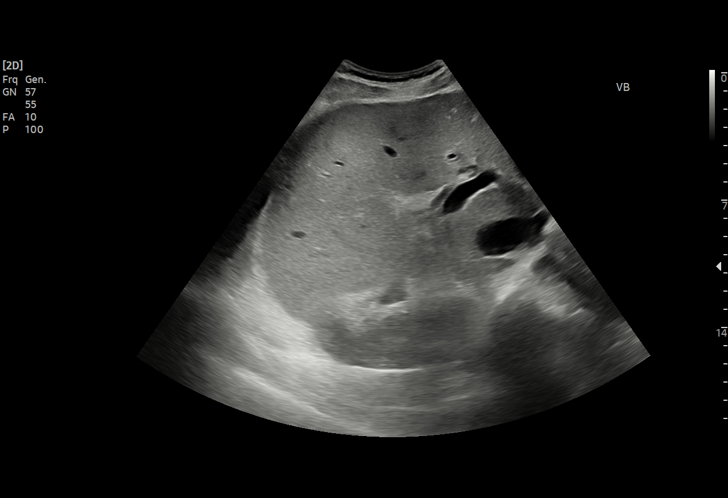
[im 26/68]
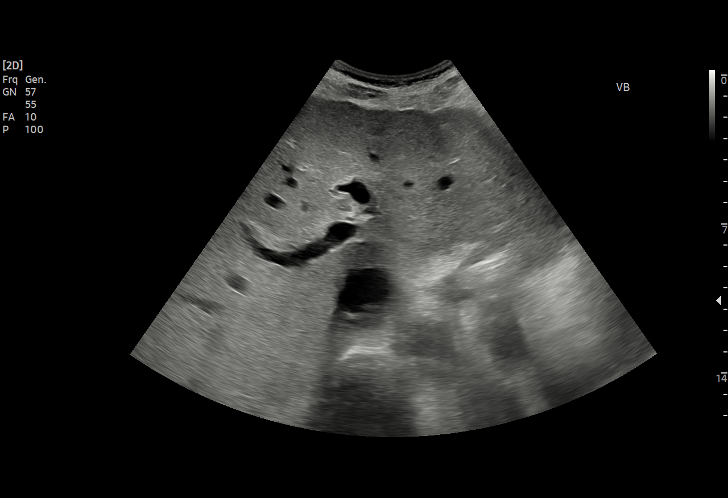
[im 28/68]
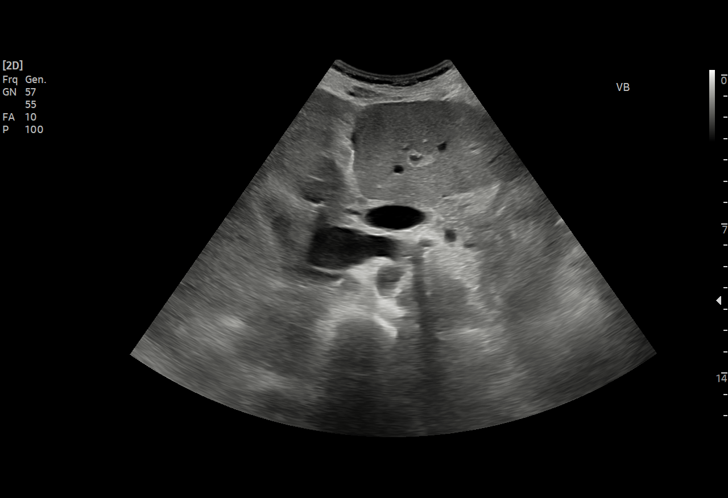
[im 34/68]
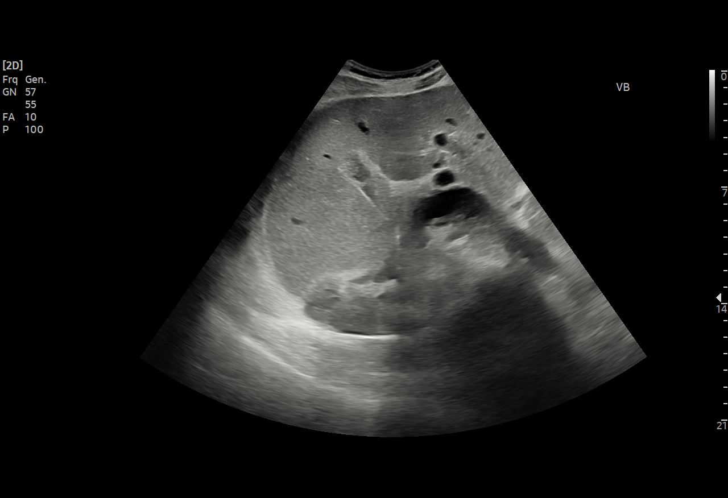
[im 40/68]
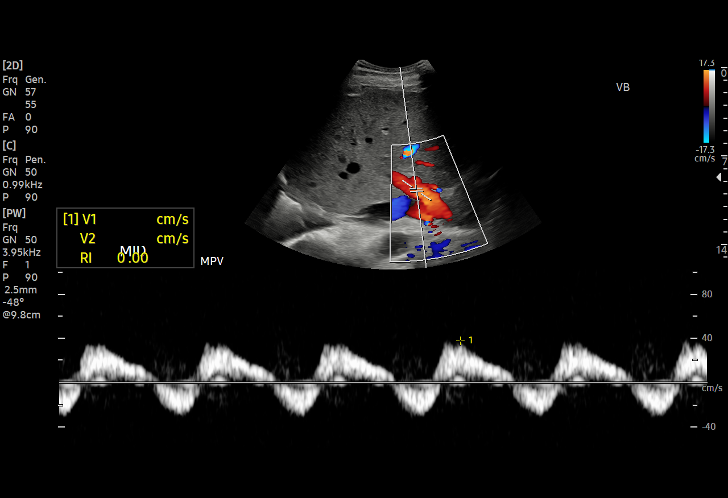
[im 42/68]
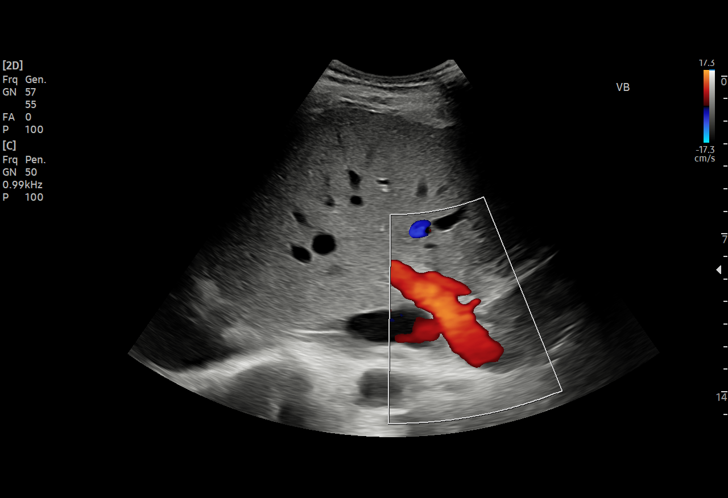
[im 48/68]
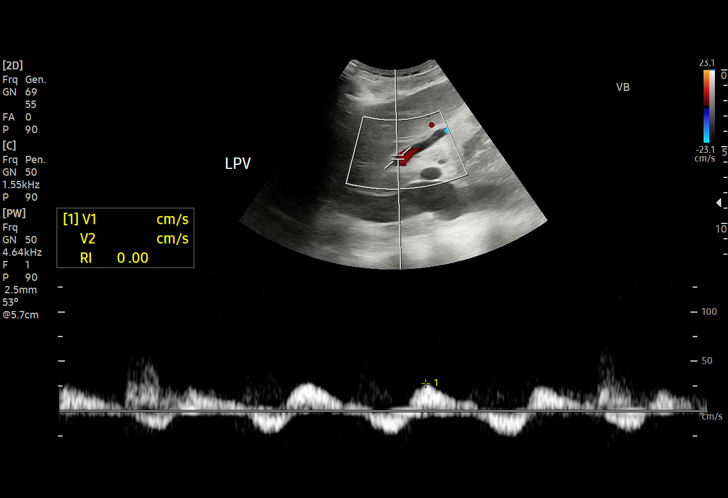
[im 54/68]
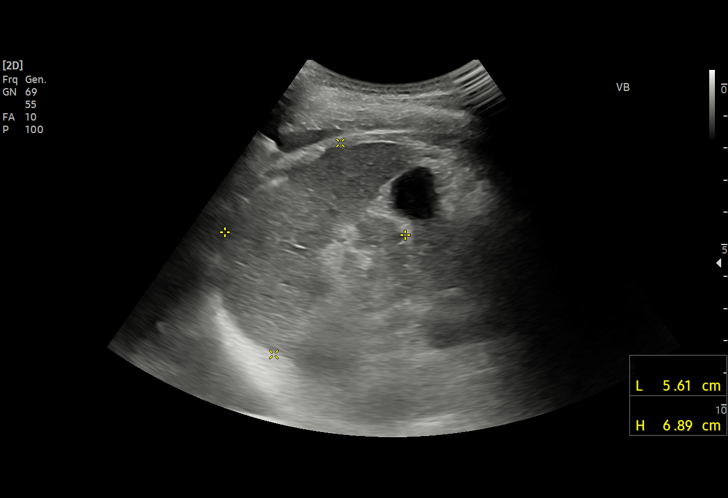
[im 56/68]
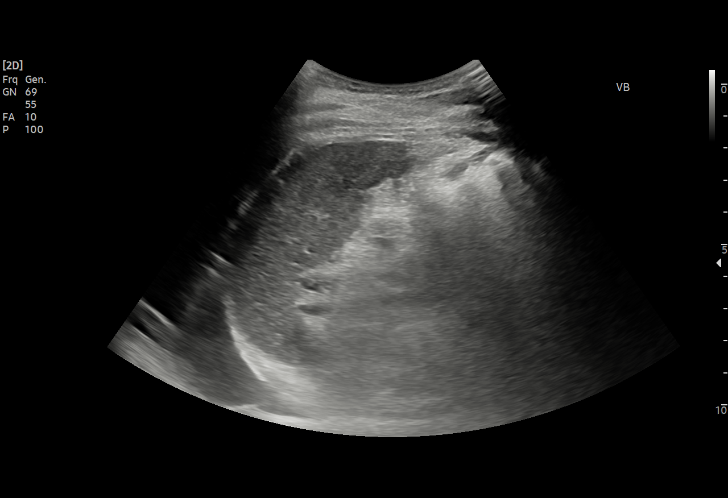
[im 62/68]
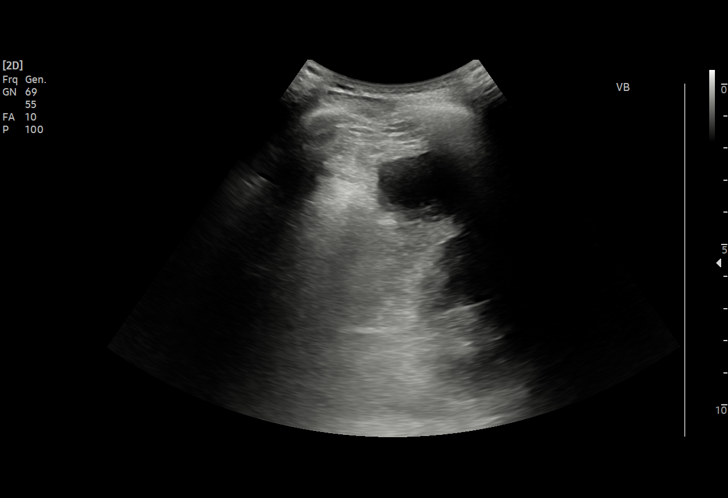
[im 68/68]
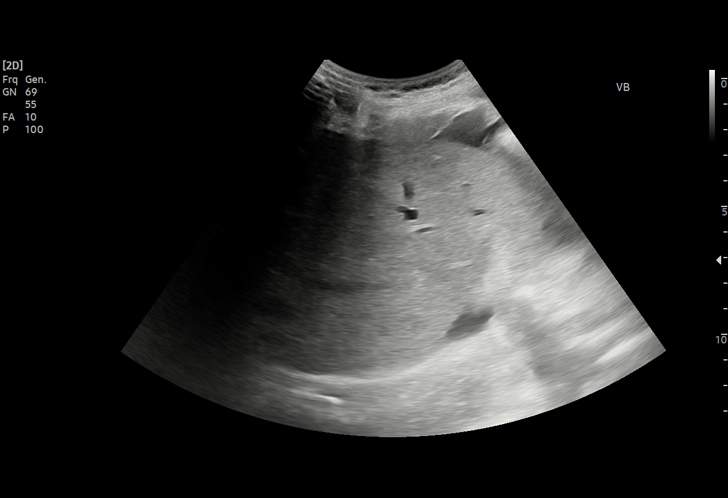

[15 of 25 positions shown; findings below may reference images not displayed]

FINDINGS: Liver: Normal parenchymal echogenicity. Normal hepatic contour
without nodularity.

No focal lesion, mass or intrahepatic biliary ductal dilatation.

Main Portal Vein size: 1.3 cm

Portal Vein Velocities

Main Prox:  27 cm/sec

Main Mid: 37 cm/sec

Main Dist:  33 cm/sec
Right: 25 cm/sec
Left: 27 cm/sec

Hepatic Vein Velocities

Right:  90 cm/sec

Middle:  61 cm/sec

Left:  81 cm/sec

IVC: Present and patent with normal respiratory phasicity.

Hepatic Artery Velocity:  101 cm/sec

Splenic Vein Velocity:  38 cm/sec

Spleen: 5.6 cm x 6.9 cm x 5.0 cm with a total volume of 101 cm^3
(411 cm^3 is upper limit normal)

Portal Vein Occlusion/Thrombus: No

Splenic Vein Occlusion/Thrombus: No

Ascites: Trace ascites in the upper abdomen

Varices: None

Patent portal, hepatic and splenic veins with normal directional
flow. Negative for portal vein occlusion or thrombus.

Trace abdominal upper abdominal ascites
IMPRESSION: Normal hepatic venous Doppler.

Trace upper abdominal ascites

## 2023-04-28 ENCOUNTER — Telehealth: Payer: Self-pay

## 2023-04-28 NOTE — Telephone Encounter (Signed)
[facility] home health nurse Collin Young called to inform she did a PVD screening on patient today and it was abnormal on both legs. Patient reports pain when walking and moving around.

## 2023-04-29 ENCOUNTER — Encounter: Payer: Self-pay | Admitting: Family Medicine

## 2023-04-29 DIAGNOSIS — M79604 Pain in right leg: Secondary | ICD-10-CM

## 2023-05-04 ENCOUNTER — Inpatient Hospital Stay: Payer: 59 | Attending: Hematology & Oncology

## 2023-05-05 ENCOUNTER — Other Ambulatory Visit (HOSPITAL_BASED_OUTPATIENT_CLINIC_OR_DEPARTMENT_OTHER): Payer: Self-pay

## 2023-05-05 ENCOUNTER — Other Ambulatory Visit: Payer: Self-pay | Admitting: *Deleted

## 2023-05-05 DIAGNOSIS — C12 Malignant neoplasm of pyriform sinus: Secondary | ICD-10-CM

## 2023-05-05 DIAGNOSIS — C61 Malignant neoplasm of prostate: Secondary | ICD-10-CM

## 2023-05-05 MED ORDER — FENTANYL 12 MCG/HR TD PT72
1.0000 | MEDICATED_PATCH | TRANSDERMAL | 0 refills | Status: AC
Start: 2023-05-05 — End: ?

## 2023-05-06 ENCOUNTER — Ambulatory Visit (HOSPITAL_COMMUNITY)
Admission: RE | Admit: 2023-05-06 | Discharge: 2023-05-06 | Disposition: A | Discharge status: Home / Self Care | Payer: 59 | Source: Ambulatory Visit | Attending: Family Medicine | Admitting: Family Medicine

## 2023-05-06 DIAGNOSIS — M79604 Pain in right leg: Secondary | ICD-10-CM | POA: Insufficient documentation

## 2023-05-06 DIAGNOSIS — M79605 Pain in left leg: Secondary | ICD-10-CM | POA: Insufficient documentation

## 2023-05-06 LAB — VAS US ABI WITH/WO TBI
Left ABI: 0.66
Right ABI: 0.48

## 2023-05-07 ENCOUNTER — Other Ambulatory Visit: Payer: Self-pay | Admitting: Family Medicine

## 2023-05-07 ENCOUNTER — Encounter: Payer: Self-pay | Admitting: Family Medicine

## 2023-05-07 DIAGNOSIS — I739 Peripheral vascular disease, unspecified: Secondary | ICD-10-CM

## 2023-05-24 ENCOUNTER — Other Ambulatory Visit (HOSPITAL_COMMUNITY): Payer: Self-pay | Admitting: Family Medicine

## 2023-05-27 ENCOUNTER — Encounter: Payer: Self-pay | Admitting: Vascular Surgery

## 2023-05-27 ENCOUNTER — Ambulatory Visit (INDEPENDENT_AMBULATORY_CARE_PROVIDER_SITE_OTHER): Payer: 59 | Admitting: Vascular Surgery

## 2023-05-27 VITALS — BP 86/54 | HR 92 | Temp 97.9°F | Resp 20 | Ht 66.0 in | Wt 133.0 lb

## 2023-05-27 DIAGNOSIS — I70213 Atherosclerosis of native arteries of extremities with intermittent claudication, bilateral legs: Secondary | ICD-10-CM | POA: Diagnosis not present

## 2023-05-27 NOTE — Progress Notes (Signed)
Patient ID: Collin Young, male   DOB: [birth year], 72 y.o.   MRN: [id number]  Reason for Consult: New Patient (Initial Visit)   Referred by Copland, Gwenlyn Found, MD  Subjective:     HPI:  Collin Young is a 72 y.o. male with history of prostate and esophageal cancer with COPD and congestive heart failure.  He has quit smoking.  He has been evaluated here in the remote past for bilateral lower extremity pain although he did not have any lifestyle limitation at that time we had discussed continued walking and he has remained very active.  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  He states that more recently his pain has become more moderate to severe and more frequent.  He cannot walk 100 yards without stopping for at least a minute.  He states that he is able to continue working but that he has to stop throughout the day multiple times and this is lifestyle limiting for him.  He is on aspirin also takes Eliquis.  He does not have any tissue loss or ulceration and denies rest pain.  No previous history of stroke, TIA or amaurosis.  Past Medical History:  Diagnosis Date   Chronic combined systolic and diastolic CHF (congestive heart failure) (HCC)    followed by dr t. Duke Salvia   COPD (chronic obstructive pulmonary disease) (HCC)    Coronary artery disease cardiologist-- dr Chilton Si   per cardiac cath 01-05-2017  chronic total occlusion pLCx with collaterals, 99% severe calcified prox. to mid RCA, otherwise mild to moderate CAD (medically managed)   Esophageal cancer, stage IIIB [facility]) oncologist-  dr ennever/  dr moody   dx 2010  SCC Stage IIIB completed chemoradiation;  localized recurrent left piriform sinus 01/ 2015,  completed concurrent chemoradiatoin 04/ 2015   GERD (gastroesophageal reflux disease)    09-07-2018   no issues since Gtube removed 06/ 2019   History of alcohol abuse    quit 2001   History of cancer chemotherapy     2010;   2015   History of radiation therapy    10-19-2013 to  12-05-2013 pyriform sinus 69.96 Gy/43fx;   Radiation completed 2010 for esophageal cancer   History of seizure 2001   alcohol withdrawal   HOH (hard of hearing)    Hyperlipidemia    Hypertension    Iron deficiency anemia due to chronic blood loss 11/14/2021   Ischemic cardiomyopathy 12/2016   01-03-2017  echo,  ef 10-15%/   echo 05-06-2017 EF improved to 45-50%   Prostate cancer [facility]) urologist-  dr ottelin/  oncologist-- dr Kathrynn Running   dx 05-27-2018--- Stage T1c,  Gleason 4+3,  PSA 9.3--  scheduled for brachytherapy 09-23-2017   Renal cyst, left    Family History  Problem Relation Age of Onset   Breast cancer Mother    Prostate cancer Neg Hx    Colon cancer Neg Hx    Pancreatic cancer Neg Hx    Past Surgical History:  Procedure Laterality Date   BIOPSY  12/18/2021   Procedure: BIOPSY;  Surgeon: Rachael Fee, MD;  Location: [facility];  Service: Gastroenterology;;   CARDIOVASCULAR STRESS TEST  10/09/09   normal nuclearr stress test, EF 57% Adolph Pollack)   CENTRAL LINE INSERTION  06/16/2021   Procedure: CENTRAL LINE INSERTION;  Surgeon: Dolores Patty, MD;  Location: [facility];  Service: Cardiovascular;;   ESOPHAGOGASTRODUODENOSCOPY (  EGD) WITH PROPOFOL N/A 12/18/2021   Procedure: ESOPHAGOGASTRODUODENOSCOPY (EGD) WITH PROPOFOL;  Surgeon: Rachael Fee, MD;  Location: [facility];  Service: Gastroenterology;  Laterality: N/A;   IR GASTROSTOMY TUBE MOD SED  01/13/2017   IR GASTROSTOMY TUBE REMOVAL  02/03/2018   IR PATIENT EVAL TECH 0-60 MINS  03/25/2017   IR REMOVAL TUN ACCESS W/ PORT W/O FL MOD SED  01/15/2017   IR REPLACE G-TUBE SIMPLE WO FLUORO  01/13/2018   LARYNGOSCOPY N/A 09/15/2013   Procedure: LARYNGOSCOPY;  Surgeon: Christia Reading, MD;  Location: [facility] OR;  Service: ENT;  Laterality: N/A;  direct laryngoscopy with biopsy and esophagoscopy   RADIOACTIVE SEED IMPLANT N/A 09/23/2018   Procedure: RADIOACTIVE  SEED IMPLANT/BRACHYTHERAPY IMPLANT;  Surgeon: Ihor Gully, MD;  Location: [facility];  Service: Urology;  Laterality: N/A;   RIGHT HEART CATH N/A 06/16/2021   Procedure: RIGHT HEART CATH;  Surgeon: Dolores Patty, MD;  Location: [facility];  Service: Cardiovascular;  Laterality: N/A;   RIGHT/LEFT HEART CATH AND CORONARY ANGIOGRAPHY N/A 01/05/2017   Procedure: Right/Left Heart Cath and Coronary Angiography;  Surgeon: Kathleene Hazel, MD;  Location: [facility] INVASIVE CV LAB;  Service: Cardiovascular;  Laterality: N/A;   TRANSTHORACIC ECHOCARDIOGRAM  05/06/2017   ef 45-50%,  grade 1 diastolic dysfunction/  AV severe calcified non coronary cusp with moderate regurg. , no stenosis (valve area per echo 01-05-2017 1.08cm^2)/  mild MR, TR, and PR/  mild LAE    Short Social History:  Social History   Tobacco Use   Smoking status: Former    Current packs/day: 0.00    Average packs/day: 1 pack/day for 48.8 years (48.8 ttl pk-yrs)    Types: Cigarettes    Start date: 11/08/1960    Quit date: 08/31/2009    Years since quitting: 13.7   Smokeless tobacco: Former    Types: Chew    Quit date: 09/01/1999  Substance Use Topics   Alcohol use: Not Currently    Alcohol/week: 0.0 standard drinks of alcohol    Comment: quit in 2001    No Known Allergies  Current Outpatient Medications  Medication Sig Dispense Refill   albuterol (VENTOLIN HFA) 108 (90 Base) MCG/ACT inhaler Inhale 2 puffs into the lungs every 6 (six) hours as needed for wheezing or shortness of breath. 18 g 5   apixaban (ELIQUIS) 5 MG TABS tablet Take 1 tablet (5 mg total) by mouth 2 (two) times daily. 60 tablet 5   BAYER LOW DOSE 81 MG EC tablet Take 81 mg by mouth daily. Swallow whole.     CORLANOR 5 MG TABS tablet TAKE 1/2 TABLET(2.5 MG) BY MOUTH TWICE DAILY WITH A MEAL 45 tablet 3   dapagliflozin propanediol (FARXIGA) 10 MG TABS tablet Take 1 tablet (10 mg total) by mouth daily. 90 tablet 3   Evolocumab  (REPATHA SURECLICK) 140 MG/ML SOAJ Inject 140 mg into the skin every 14 (fourteen) days. Please keep your upcoming appointment for refills. 6 mL 3   FARXIGA 10 MG TABS tablet TAKE 1 TABLET(10 MG) BY MOUTH DAILY 90 tablet 3   fentaNYL (DURAGESIC) 12 MCG/HR Place 1 patch onto the skin every 3 (three) days. 10 patch 0   ipratropium (ATROVENT) 0.03 % nasal spray Place 2 sprays into both nostrils every 12 (twelve) hours. 30 mL 12   polyethylene glycol (MIRALAX / GLYCOLAX) 17 g packet Take 17 g by mouth daily as needed for mild constipation. 14 each 0   pravastatin (PRAVACHOL)  80 MG tablet TAKE 1 TABLET(80 MG) BY MOUTH EVERY EVENING 90 tablet 3   sacubitril-valsartan (ENTRESTO) 24-26 MG Take 1 tablet by mouth 2 (two) times daily. 60 tablet 11   spironolactone (ALDACTONE) 25 MG tablet Take 1 tablet (25 mg total) by mouth as needed. 90 tablet 3   umeclidinium bromide (INCRUSE ELLIPTA) 62.5 MCG/ACT AEPB Inhale 1 puff into the lungs daily. 30 each 6   furosemide (LASIX) 40 MG tablet Take 1 tablet (40 mg total) by mouth as needed. 30 tablet 3   No current facility-administered medications for this visit.   Facility-Administered Medications Ordered in Other Visits  Medication Dose Route Frequency Provider Last Rate Last Admin   topical emolient (BIAFINE) emulsion   Topical Daily Dorothy Puffer, MD   Given at 01/19/14 0912    Review of Systems  HENT: HENT negative.  Eyes: Eyes negative.  Respiratory: Respiratory negative.  Cardiovascular: Positive for claudication.  GI: Gastrointestinal negative.  Musculoskeletal: Musculoskeletal negative.  Skin: Skin negative.  Neurological: Neurological negative. Hematologic: Hematologic/lymphatic negative.        Objective:  Objective  Vitals:   05/27/23 0855  BP: (!) 86/54  Pulse: 92  Resp: 20  Temp: 97.9 F (36.6 C)  SpO2: 92%    Physical Exam HENT:     Head: Normocephalic.     Nose: Nose normal.     Mouth/Throat:     Mouth: Mucous membranes are  moist.  Eyes:     Pupils: Pupils are equal, round, and reactive to light.  Cardiovascular:     Pulses:          Radial pulses are 2+ on the right side and 2+ on the left side.       Femoral pulses are 0 on the right side and 0 on the left side. Pulmonary:     Effort: Pulmonary effort is normal.  Abdominal:     General: Abdomen is flat.  Musculoskeletal:        General: Normal range of motion.     Right lower leg: No edema.     Left lower leg: No edema.  Skin:    Capillary Refill: Capillary refill takes 2 to 3 seconds.  Neurological:     General: No focal deficit present.     Mental Status: He is alert.  Psychiatric:        Mood and Affect: Mood normal.        Thought Content: Thought content normal.        Judgment: Judgment normal.     Data: ABI Findings:  +---------+------------------+-----+----------+--------+  Right   Rt Pressure (mmHg)IndexWaveform  Comment   +---------+------------------+-----+----------+--------+  Brachial 128                                        +---------+------------------+-----+----------+--------+  PTA     61                0.48 monophasic          +---------+------------------+-----+----------+--------+  DP      57                0.45 monophasic          +---------+------------------+-----+----------+--------+  Great Toe29                0.23 Abnormal            +---------+------------------+-----+----------+--------+   +---------+------------------+-----+----------+-------+  Left    Lt Pressure (mmHg)IndexWaveform  Comment  +---------+------------------+-----+----------+-------+  Brachial 122                                       +---------+------------------+-----+----------+-------+  PTA     85                0.66 monophasic         +---------+------------------+-----+----------+-------+  DP      82                0.64 monophasic          +---------+------------------+-----+----------+-------+  Great Toe51                0.40 Normal             +---------+------------------+-----+----------+-------+   +-------+-----------+-----------+------------+------------+  ABI/TBIToday's ABIToday's TBIPrevious ABIPrevious TBI  +-------+-----------+-----------+------------+------------+  Right 0.48       0.23       0.66        0.46          +-------+-----------+-----------+------------+------------+  Left  0.66       0.40       0.69        0.60          +-------+-----------+-----------+------------+------------+        Arterial wall calcification precludes accurate ankle pressures and ABIs.  Right ABIs and TBIs appear decreased compared to prior study on 03/19/20.  Left ABIs appear essentially unchanged compared to prior study on 03/19/20.  Left TBI is decreased    Summary:  Right: Resting right ankle-brachial index indicates severe right lower  extremity arterial disease. The right toe-brachial index is abnormal. PPG  tracings appear dampened.   Left: Resting left ankle-brachial index indicates moderate left lower  extremity arterial disease. The left toe-brachial index is abnormal.      Assessment/Plan:    72 year old male with lifestyle limiting claudication with severely depressed ABIs on the right relative to the left and no palpable femoral pulses on today's exam.  We have discussed the need to continue aspirin he is also on Eliquis.  We have discussed dedicated walking regimen and hopefully he will be able to walk further with this although he does have significant lifestyle limitation I am concerned that he has significant aortoiliac disease due to lack of femoral pulses.  I will have him follow-up in 3 months with CT angio of with bilateral lower extremity runoff for further evaluation and the patient would like to avoid any future procedures we did discuss possible angiography with stent placement  given his level of disability.  We will further discuss the CT is performed.     Maeola Harman MD [facility]

## 2023-05-28 ENCOUNTER — Telehealth: Payer: Self-pay

## 2023-05-28 NOTE — Telephone Encounter (Signed)
Spoke with daughter after she had sent several messages regarding clarification of her father's appt this week with Dr. Randie Heinz. She feels pt was confused and did not hear well and she was unable to come to the appt. She thinks her dad would want to proceed with AGM. They have been scheduled for f/u next month on a date she can be present for to discuss scheduling AGM.

## 2023-06-02 ENCOUNTER — Encounter: Payer: 59 | Admitting: Vascular Surgery

## 2023-06-03 ENCOUNTER — Other Ambulatory Visit: Payer: Self-pay | Admitting: *Deleted

## 2023-06-03 DIAGNOSIS — C12 Malignant neoplasm of pyriform sinus: Secondary | ICD-10-CM

## 2023-06-03 DIAGNOSIS — C61 Malignant neoplasm of prostate: Secondary | ICD-10-CM

## 2023-06-03 MED ORDER — FENTANYL 12 MCG/HR TD PT72: 1.0000 | MEDICATED_PATCH | TRANSDERMAL | 0 refills | Status: DC

## 2023-06-29 ENCOUNTER — Other Ambulatory Visit (HOSPITAL_COMMUNITY): Payer: Self-pay

## 2023-06-29 DIAGNOSIS — I5022 Chronic systolic (congestive) heart failure: Secondary | ICD-10-CM

## 2023-06-29 MED ORDER — ENTRESTO 24-26 MG PO TABS
1.0000 | ORAL_TABLET | Freq: Two times a day (BID) | ORAL | 0 refills | Status: DC
Start: 2023-06-29 — End: 2024-01-11

## 2023-06-30 ENCOUNTER — Ambulatory Visit (INDEPENDENT_AMBULATORY_CARE_PROVIDER_SITE_OTHER): Payer: 59 | Admitting: Vascular Surgery

## 2023-06-30 ENCOUNTER — Encounter: Payer: Self-pay | Admitting: Vascular Surgery

## 2023-06-30 VITALS — BP 101/64 | HR 90 | Temp 98.3°F | Resp 20 | Ht 66.0 in | Wt 131.0 lb

## 2023-06-30 DIAGNOSIS — I70213 Atherosclerosis of native arteries of extremities with intermittent claudication, bilateral legs: Secondary | ICD-10-CM | POA: Diagnosis not present

## 2023-06-30 NOTE — Progress Notes (Signed)
Patient ID: Collin Young, male   DOB: 10/08/1950, 72 y.o.   MRN: [id number]  Reason for Consult: No chief complaint on file.   Referred by Copland, Gwenlyn Found, MD  Subjective:     HPI:  Collin Young is a 72 y.o. male I have recently seen for short distance claudication.  Patient does have significant medical history but continues to work.  States that he has not able to walk far enough to complete his work activities specifically could not walk 50 yards at a time without stopping due to cramping.  He continues to take aspirin and Eliquis.  He denies tissue loss or ulceration.  No history of stroke, TIA or amaurosis.  Past Medical History:  Diagnosis Date   Chronic combined systolic and diastolic CHF (congestive heart failure) (HCC)    followed by dr t. Duke Salvia   COPD (chronic obstructive pulmonary disease) (HCC)    Coronary artery disease cardiologist-- dr Chilton Si   per cardiac cath 01-05-2017  chronic total occlusion pLCx with collaterals, 99% severe calcified prox. to mid RCA, otherwise mild to moderate CAD (medically managed)   Esophageal cancer, stage IIIB [facility]) oncologist-  dr ennever/  dr moody   dx 2010  SCC Stage IIIB completed chemoradiation;  localized recurrent left piriform sinus 01/ 2015,  completed concurrent chemoradiatoin 04/ 2015   GERD (gastroesophageal reflux disease)    09-07-2018   no issues since Gtube removed 06/ 2019   History of alcohol abuse    quit 2001   History of cancer chemotherapy    2010;   2015   History of radiation therapy    10-19-2013 to  12-05-2013 pyriform sinus 69.96 Gy/35fx;   Radiation completed 2010 for esophageal cancer   History of seizure 2001   alcohol withdrawal   HOH (hard of hearing)    Hyperlipidemia    Hypertension    Iron deficiency anemia due to chronic blood loss 11/14/2021   Ischemic cardiomyopathy 12/2016   01-03-2017  echo,  ef 10-15%/   echo 05-06-2017 EF improved to 45-50%   Prostate cancer [facility])  urologist-  dr ottelin/  oncologist-- dr Kathrynn Running   dx 05-27-2018--- Stage T1c,  Gleason 4+3,  PSA 9.3--  scheduled for brachytherapy 09-23-2017   Renal cyst, left    Family History  Problem Relation Age of Onset   Breast cancer Mother    Prostate cancer Neg Hx    Colon cancer Neg Hx    Pancreatic cancer Neg Hx    Past Surgical History:  Procedure Laterality Date   BIOPSY  12/18/2021   Procedure: BIOPSY;  Surgeon: Rachael Fee, MD;  Location: [facility];  Service: Gastroenterology;;   CARDIOVASCULAR STRESS TEST  10/09/09   normal nuclearr stress test, EF 57% [facility])   CENTRAL LINE INSERTION  06/16/2021   Procedure: CENTRAL LINE INSERTION;  Surgeon: Dolores Patty, MD;  Location: [facility];  Service: Cardiovascular;;   ESOPHAGOGASTRODUODENOSCOPY (EGD) WITH PROPOFOL N/A 12/18/2021   Procedure: ESOPHAGOGASTRODUODENOSCOPY (EGD) WITH PROPOFOL;  Surgeon: Rachael Fee, MD;  Location: [facility];  Service: Gastroenterology;  Laterality: N/A;   IR GASTROSTOMY TUBE MOD SED  01/13/2017   IR GASTROSTOMY TUBE REMOVAL  02/03/2018   IR PATIENT EVAL TECH 0-60 MINS  03/25/2017   IR REMOVAL TUN ACCESS W/ PORT W/O FL MOD SED  01/15/2017   IR REPLACE G-TUBE SIMPLE WO FLUORO  01/13/2018   LARYNGOSCOPY N/A 09/15/2013   Procedure: LARYNGOSCOPY;  Surgeon:  Christia Reading, MD;  Location: [facility] OR;  Service: ENT;  Laterality: N/A;  direct laryngoscopy with biopsy and esophagoscopy   RADIOACTIVE SEED IMPLANT N/A 09/23/2018   Procedure: RADIOACTIVE SEED IMPLANT/BRACHYTHERAPY IMPLANT;  Surgeon: Ihor Gully, MD;  Location: [facility];  Service: Urology;  Laterality: N/A;   RIGHT HEART CATH N/A 06/16/2021   Procedure: RIGHT HEART CATH;  Surgeon: Dolores Patty, MD;  Location: [facility];  Service: Cardiovascular;  Laterality: N/A;   RIGHT/LEFT HEART CATH AND CORONARY ANGIOGRAPHY N/A 01/05/2017   Procedure: Right/Left Heart Cath and Coronary Angiography;  Surgeon: Kathleene Hazel, MD;  Location: [facility]VASIVE CV LAB;  Service: Cardiovascular;  Laterality: N/A;   TRANSTHORACIC ECHOCARDIOGRAM  05/06/2017   ef 45-50%,  grade 1 diastolic dysfunction/  AV severe calcified non coronary cusp with moderate regurg. , no stenosis (valve area per echo 01-05-2017 1.08cm^2)/  mild MR, TR, and PR/  mild LAE    Short Social History:  Social History   Tobacco Use   Smoking status: Former    Current packs/day: 0.00    Average packs/day: 1 pack/day for 48.8 years (48.8 ttl pk-yrs)    Types: Cigarettes    Start date: 11/08/1960    Quit date: 08/31/2009    Years since quitting: 13.8   Smokeless tobacco: Former    Types: Chew    Quit date: 09/01/1999  Substance Use Topics   Alcohol use: Not Currently    Alcohol/week: 0.0 standard drinks of alcohol    Comment: quit in 2001    No Known Allergies  Current Outpatient Medications  Medication Sig Dispense Refill   albuterol (VENTOLIN HFA) 108 (90 Base) MCG/ACT inhaler Inhale 2 puffs into the lungs every 6 (six) hours as needed for wheezing or shortness of breath. 18 g 5   apixaban (ELIQUIS) 5 MG TABS tablet Take 1 tablet (5 mg total) by mouth 2 (two) times daily. 60 tablet 5   BAYER LOW DOSE 81 MG EC tablet Take 81 mg by mouth daily. Swallow whole.     CORLANOR 5 MG TABS tablet TAKE 1/2 TABLET(2.5 MG) BY MOUTH TWICE DAILY WITH A MEAL 45 tablet 3   dapagliflozin propanediol (FARXIGA) 10 MG TABS tablet Take 1 tablet (10 mg total) by mouth daily. 90 tablet 3   Evolocumab (REPATHA SURECLICK) 140 MG/ML SOAJ Inject 140 mg into the skin every 14 (fourteen) days. Please keep your upcoming appointment for refills. 6 mL 3   FARXIGA 10 MG TABS tablet TAKE 1 TABLET(10 MG) BY MOUTH DAILY 90 tablet 3   fentaNYL (DURAGESIC) 12 MCG/HR Place 1 patch onto the skin every 3 (three) days. 10 patch 0   furosemide (LASIX) 40 MG tablet Take 1 tablet (40 mg total) by mouth as needed. 30 tablet 3   ipratropium (ATROVENT) 0.03 % nasal spray Place 2  sprays into both nostrils every 12 (twelve) hours. 30 mL 12   polyethylene glycol (MIRALAX / GLYCOLAX) 17 g packet Take 17 g by mouth daily as needed for mild constipation. 14 each 0   pravastatin (PRAVACHOL) 80 MG tablet TAKE 1 TABLET(80 MG) BY MOUTH EVERY EVENING 90 tablet 3   sacubitril-valsartan (ENTRESTO) 24-26 MG Take 1 tablet by mouth 2 (two) times daily. 60 tablet 0   spironolactone (ALDACTONE) 25 MG tablet Take 1 tablet (25 mg total) by mouth as needed. 90 tablet 3   umeclidinium bromide (INCRUSE ELLIPTA) 62.5 MCG/ACT AEPB Inhale 1 puff into the lungs daily. 30 each  6   No current facility-administered medications for this visit.   Facility-Administered Medications Ordered in Other Visits  Medication Dose Route Frequency Provider Last Rate Last Admin   topical emolient (BIAFINE) emulsion   Topical Daily Dorothy Puffer, MD   Given at 01/19/14 5784    Review of Systems  Constitutional:  Constitutional negative. HENT: HENT negative.  Eyes: Eyes negative.  Respiratory: Respiratory negative.  Cardiovascular: Positive for claudication.  GI: Gastrointestinal negative.  Musculoskeletal: Musculoskeletal negative.  Skin: Skin negative.  Hematologic: Hematologic/lymphatic negative.  Psychiatric: Psychiatric negative.        Objective:  Objective   Vitals:   06/30/23 1341  BP: 101/64  Pulse: 90  Resp: 20  Temp: 98.3 F (36.8 C)  SpO2: 93%     Physical Exam HENT:     Head: Normocephalic.     Nose: Nose normal.  Eyes:     Pupils: Pupils are equal, round, and reactive to light.  Cardiovascular:     Pulses:          Femoral pulses are 0 on the right side and 0 on the left side. Pulmonary:     Effort: Pulmonary effort is normal.  Abdominal:     General: Abdomen is flat.     Palpations: Abdomen is soft.  Musculoskeletal:     Right lower leg: No edema.     Left lower leg: No edema.  Skin:    Capillary Refill: Capillary refill takes 2 to 3 seconds.  Neurological:      General: No focal deficit present.     Mental Status: He is alert.  Psychiatric:        Mood and Affect: Mood normal.        Thought Content: Thought content normal.        Judgment: Judgment normal.     Data: No new studies     Assessment/Plan:     72 year old male here for further evaluation with short distance claudication although he continues to walk and work every day is much as possible.  No tissue loss or ulceration.  I cannot appreciate femoral pulses today as such we will obtain CT angio abdomen and pelvis with runoff.  He also has known moderate grade left ICA stenosis from the past and will obtain carotid duplex on his follow-up.  We discussed the need for continued walking he demonstrates good understanding I will see him back after CTA.     Maeola Harman MD [facility]

## 2023-07-09 ENCOUNTER — Other Ambulatory Visit: Payer: Self-pay

## 2023-07-09 DIAGNOSIS — I70213 Atherosclerosis of native arteries of extremities with intermittent claudication, bilateral legs: Secondary | ICD-10-CM

## 2023-07-16 ENCOUNTER — Other Ambulatory Visit: Payer: Self-pay

## 2023-07-16 DIAGNOSIS — I6522 Occlusion and stenosis of left carotid artery: Secondary | ICD-10-CM

## 2023-07-20 ENCOUNTER — Ambulatory Visit (INDEPENDENT_AMBULATORY_CARE_PROVIDER_SITE_OTHER): Payer: 59 | Admitting: *Deleted

## 2023-07-20 DIAGNOSIS — Z0000 Encounter for general adult medical examination without abnormal findings: Secondary | ICD-10-CM | POA: Diagnosis not present

## 2023-07-20 NOTE — Progress Notes (Signed)
Subjective:   Collin Young is a 72 y.o. male who presents for Medicare Annual/Subsequent preventive examination.  Visit Complete: Virtual I connected with  Collin Young on 07/20/23 by a audio enabled telemedicine application and verified that I am speaking with the correct person using two identifiers.  Patient Location: Home  Provider Location: Home Office  I discussed the limitations of evaluation and management by telemedicine. The patient expressed understanding and agreed to proceed.  Vital Signs: Because this visit was a virtual/telehealth visit, some criteria may be missing or patient reported. Any vitals not documented were not able to be obtained and vitals that have been documented are patient reported.   Cardiac Risk Factors include: advanced age (>20men, >90 women);male gender;dyslipidemia     Objective:    There were no vitals filed for this visit. There is no height or weight on file to calculate BMI.     07/20/2023    8:49 AM 03/19/2023    1:18 PM 09/16/2022    3:03 PM 12/18/2021    8:38 AM 11/12/2021    3:54 PM 08/29/2021   12:56 PM 08/01/2021    9:09 AM  Advanced Directives  Does Patient Have a Medical Advance Directive? Yes No No No Yes Yes No  Type of Advance Directive Living will;Out of facility DNR (pink MOST or yellow form)    Healthcare Power of [place];Living will Healthcare Power of [place];Living will   Does patient want to make changes to medical advance directive? No - Patient declined      No - Patient declined  Copy of Healthcare Power of Attorney in Chart?     Yes - validated most recent copy scanned in chart (See row information) Yes - validated most recent copy scanned in chart (See row information)   Would patient like information on creating a medical advance directive?  No - Patient declined No - Patient declined No - Patient declined No - Patient declined      Current Medications (verified) Outpatient Encounter Medications as of  07/20/2023  Medication Sig   albuterol (VENTOLIN HFA) 108 (90 Base) MCG/ACT inhaler Inhale 2 puffs into the lungs every 6 (six) hours as needed for wheezing or shortness of breath.   apixaban (ELIQUIS) 5 MG TABS tablet Take 1 tablet (5 mg total) by mouth 2 (two) times daily.   BAYER LOW DOSE 81 MG EC tablet Take 81 mg by mouth daily. Swallow whole.   CORLANOR 5 MG TABS tablet TAKE 1/2 TABLET(2.5 MG) BY MOUTH TWICE DAILY WITH A MEAL   dapagliflozin propanediol (FARXIGA) 10 MG TABS tablet Take 1 tablet (10 mg total) by mouth daily.   Evolocumab (REPATHA SURECLICK) 140 MG/ML SOAJ Inject 140 mg into the skin every 14 (fourteen) days. Please keep your upcoming appointment for refills.   FARXIGA 10 MG TABS tablet TAKE 1 TABLET(10 MG) BY MOUTH DAILY   fentaNYL (DURAGESIC) 12 MCG/HR Place 1 patch onto the skin every 3 (three) days.   furosemide (LASIX) 40 MG tablet Take 1 tablet (40 mg total) by mouth as needed.   ipratropium (ATROVENT) 0.03 % nasal spray Place 2 sprays into both nostrils every 12 (twelve) hours.   polyethylene glycol (MIRALAX / GLYCOLAX) 17 g packet Take 17 g by mouth daily as needed for mild constipation.   pravastatin (PRAVACHOL) 80 MG tablet TAKE 1 TABLET(80 MG) BY MOUTH EVERY EVENING   sacubitril-valsartan (ENTRESTO) 24-26 MG Take 1 tablet by mouth 2 (two) times daily.   spironolactone (  ALDACTONE) 25 MG tablet Take 1 tablet (25 mg total) by mouth as needed.   umeclidinium bromide (INCRUSE ELLIPTA) 62.5 MCG/ACT AEPB Inhale 1 puff into the lungs daily.   Facility-Administered Encounter Medications as of 07/20/2023  Medication   topical emolient (BIAFINE) emulsion    Allergies (verified) Patient has no known allergies.   History: Past Medical History:  Diagnosis Date   Chronic combined systolic and diastolic CHF (congestive heart failure) (HCC)    followed by dr t. Duke Salvia   COPD (chronic obstructive pulmonary disease) (HCC)    Coronary artery disease cardiologist-- dr  Chilton Si   per cardiac cath 01-05-2017  chronic total occlusion pLCx with collaterals, 99% severe calcified prox. to mid RCA, otherwise mild to moderate CAD (medically managed)   Esophageal cancer, stage IIIB [facility]) oncologist-  dr ennever/  dr moody   dx 2010  SCC Stage IIIB completed chemoradiation;  localized recurrent left piriform sinus 01/ 2015,  completed concurrent chemoradiatoin 04/ 2015   GERD (gastroesophageal reflux disease)    09-07-2018   no issues since Gtube removed 06/ 2019   History of alcohol abuse    quit 2001   History of cancer chemotherapy    2010;   2015   History of radiation therapy    10-19-2013 to  12-05-2013 pyriform sinus 69.96 Gy/77fx;   Radiation completed 2010 for esophageal cancer   History of seizure 2001   alcohol withdrawal   HOH (hard of hearing)    Hyperlipidemia    Hypertension    Iron deficiency anemia due to chronic blood loss 11/14/2021   Ischemic cardiomyopathy 12/2016   01-03-2017  echo,  ef 10-15%/   echo 05-06-2017 EF improved to 45-50%   Prostate cancer [facility]) urologist-  dr ottelin/  oncologist-- dr Kathrynn Running   dx 05-27-2018--- Stage T1c,  Gleason 4+3,  PSA 9.3--  scheduled for brachytherapy 09-23-2017   Renal cyst, left    Past Surgical History:  Procedure Laterality Date   BIOPSY  12/18/2021   Procedure: BIOPSY;  Surgeon: Rachael Fee, MD;  Location: [facility];  Service: Gastroenterology;;   CARDIOVASCULAR STRESS TEST  10/09/09   normal nuclearr stress test, EF 57% Adolph Pollack)   CENTRAL LINE INSERTION  06/16/2021   Procedure: CENTRAL LINE INSERTION;  Surgeon: Dolores Patty, MD;  Location: [facility] INVASIVE CV LAB;  Service: Cardiovascular;;   ESOPHAGOGASTRODUODENOSCOPY (EGD) WITH PROPOFOL N/A 12/18/2021   Procedure: ESOPHAGOGASTRODUODENOSCOPY (EGD) WITH PROPOFOL;  Surgeon: Rachael Fee, MD;  Location: [facility];  Service: Gastroenterology;  Laterality: N/A;   IR GASTROSTOMY TUBE MOD SED  01/13/2017   IR GASTROSTOMY TUBE  REMOVAL  02/03/2018   IR PATIENT EVAL TECH 0-60 MINS  03/25/2017   IR REMOVAL TUN ACCESS W/ PORT W/O FL MOD SED  01/15/2017   IR REPLACE G-TUBE SIMPLE WO FLUORO  01/13/2018   LARYNGOSCOPY N/A 09/15/2013   Procedure: LARYNGOSCOPY;  Surgeon: Christia Reading, MD;  Location: [facility] OR;  Service: ENT;  Laterality: N/A;  direct laryngoscopy with biopsy and esophagoscopy   RADIOACTIVE SEED IMPLANT N/A 09/23/2018   Procedure: RADIOACTIVE SEED IMPLANT/BRACHYTHERAPY IMPLANT;  Surgeon: Ihor Gully, MD;  Location: [facility];  Service: Urology;  Laterality: N/A;   RIGHT HEART CATH N/A 06/16/2021   Procedure: RIGHT HEART CATH;  Surgeon: Dolores Patty, MD;  Location: [facility] INVASIVE CV LAB;  Service: Cardiovascular;  Laterality: N/A;   RIGHT/LEFT HEART CATH AND CORONARY ANGIOGRAPHY N/A 01/05/2017   Procedure: Right/Left Heart Cath and Coronary  Angiography;  Surgeon: Kathleene Hazel, MD;  Location: [facility] INVASIVE CV LAB;  Service: Cardiovascular;  Laterality: N/A;   TRANSTHORACIC ECHOCARDIOGRAM  05/06/2017   ef 45-50%,  grade 1 diastolic dysfunction/  AV severe calcified non coronary cusp with moderate regurg. , no stenosis (valve area per echo 01-05-2017 1.08cm^2)/  mild MR, TR, and PR/  mild LAE   Family History  Problem Relation Age of Onset   Breast cancer Mother    Prostate cancer Neg Hx    Colon cancer Neg Hx    Pancreatic cancer Neg Hx    Social History   Socioeconomic History   Marital status: Legally Separated    Spouse name: Not on file   Number of children: 2   Years of education: Not on file   Highest education level: Not on file  Occupational History   Occupation: retired  Tobacco Use   Smoking status: Former    Current packs/day: 0.00    Average packs/day: 1 pack/day for 48.8 years (48.8 ttl pk-yrs)    Types: Cigarettes    Start date: 11/08/1960    Quit date: 08/31/2009    Years since quitting: 13.8   Smokeless tobacco: Former    Types: Chew    Quit date: 09/01/1999   Vaping Use   Vaping status: Never Used  Substance and Sexual Activity   Alcohol use: Not Currently    Alcohol/week: 0.0 standard drinks of alcohol    Comment: quit in 2001   Drug use: No    Comment: back in the day used cocaine,alcohol, marijuana   Sexual activity: Not Currently    Partners: Female    Birth control/protection: None  Other Topics Concern   Not on file  Social History Narrative   Not on file   Social Determinants of Health   Financial Resource Strain: Low Risk  (07/20/2023)   Overall Financial Resource Strain (CARDIA)    Difficulty of Paying Living Expenses: Not hard at all  Food Insecurity: No Food Insecurity (07/20/2023)   Hunger Vital Sign    Worried About Running Out of Food in the Last Year: Never true    Ran Out of Food in the Last Year: Never true  Transportation Needs: No Transportation Needs (07/20/2023)   PRAPARE - Administrator, Civil Service (Medical): No    Lack of Transportation (Non-Medical): No  Physical Activity: Inactive (07/20/2023)   Exercise Vital Sign    Days of Exercise per Week: 0 days    Minutes of Exercise per Session: 0 min  Stress: No Stress Concern Present (07/20/2023)   [employer] of Occupational Health - Occupational Stress Questionnaire    Feeling of Stress : Not at all  Social Connections: Moderately Integrated (07/20/2023)   Social Connection and Isolation Panel [NHANES]    Frequency of Communication with Friends and Family: More than three times a week    Frequency of Social Gatherings with Friends and Family: More than three times a week    Attends Religious Services: More than 4 times per year    Active Member of [employer] or Organizations: Yes    Attends Engineer, structural: More than 4 times per year    Marital Status: Divorced    Tobacco Counseling Counseling given: Not Answered   Clinical Intake:  Pre-visit preparation completed: Yes  Pain : No/denies pain  Nutritional Risks:  None Diabetes: No  How often do you need to have someone help you when you read instructions, pamphlets,  or other written materials from your doctor or pharmacy?: 2 - Rarely  Interpreter Needed?: No  Information entered by :: Donne Anon, CMA   Activities of Daily Living    07/20/2023    9:04 AM  In your present state of health, do you have any difficulty performing the following activities:  Hearing? 1  Comment wears hearing aids  Vision? 1  Difficulty concentrating or making decisions? 0  Walking or climbing stairs? 0  Dressing or bathing? 0  Doing errands, shopping? 0  Preparing Food and eating ? N  Using the Toilet? N  In the past six months, have you accidently leaked urine? N  Do you have problems with loss of bowel control? N  Managing your Medications? N  Managing your Finances? N  Housekeeping or managing your Housekeeping? N    Patient Care Team: Copland, Gwenlyn Found, MD as PCP - General (Family Medicine) Chilton Si, MD as PCP - Cardiology (Cardiology) Abelino Derrick, PA-C (Inactive) as Physician Assistant (Cardiology)  Indicate any recent Medical Services you may have received from other than [facility] providers in the past year (date may be approximate).     Assessment:   This is a routine wellness examination for [facility].  Hearing/Vision screen No results found.   Goals Addressed   None    Depression Screen    07/20/2023    9:10 AM 09/02/2022    1:35 PM 08/29/2021    1:09 PM 07/03/2021   10:28 AM 03/28/2021    2:55 PM 02/17/2021    1:08 PM 01/03/2020    2:04 PM  PHQ 2/9 Scores  PHQ - 2 Score 0 0 0 0 0 0 0  PHQ- 9 Score       0    Fall Risk    07/20/2023    9:08 AM 09/02/2022    1:35 PM 08/29/2021    1:07 PM 07/17/2021   10:16 AM 07/03/2021   10:28 AM  Fall Risk   Falls in the past year? 0 0 0 0 0  Number falls in past yr: 0 0 0  0  Injury with Fall? 0 0   0  Risk for fall due to : No Fall Risks No Fall Risks   No Fall Risks  Follow up  Falls evaluation completed Falls evaluation completed Falls prevention discussed  Falls evaluation completed    MEDICARE RISK AT HOME: Medicare Risk at Home Any stairs in or around the home?: No If so, are there any without handrails?: No Home free of loose throw rugs in walkways, pet beds, electrical cords, etc?: Yes Adequate lighting in your home to reduce risk of falls?: Yes Life alert?: No Use of a cane, walker or w/c?: No Grab bars in the bathroom?: No Shower chair or bench in shower?: No Elevated toilet seat or a handicapped toilet?: No  TIMED UP AND GO:  Was the test performed?  No    Cognitive Function:    07/20/2023    9:10 AM  MMSE - Mini Mental State Exam  Not completed: Unable to complete        Immunizations Immunization History  Administered Date(s) Administered   Fluad Quad(high Dose 65+) 07/15/2020, 09/02/2022   Influenza, High Dose Seasonal PF 05/14/2021   Influenza,inj,Quad PF,6+ Mos 05/23/2019   Influenza-Unspecified 05/31/2017   Moderna Covid-19 Vaccine Bivalent Booster 56yrs & up 07/03/2021   Moderna SARS-COV2 Booster Vaccination 02/17/2021   Moderna Sars-Covid-2 Vaccination 11/08/2019, 12/06/2019, 09/27/2020   Pneumococcal Conjugate-13  08/19/2020   Pneumococcal Polysaccharide-23 02/25/2022   Tdap 12/18/2011    TDAP status: Due, Education has been provided regarding the importance of this vaccine. Advised may receive this vaccine at local pharmacy or Health Dept. Aware to provide a copy of the vaccination record if obtained from local pharmacy or Health Dept. Verbalized acceptance and understanding.  Flu Vaccine status: Due, Education has been provided regarding the importance of this vaccine. Advised may receive this vaccine at local pharmacy or Health Dept. Aware to provide a copy of the vaccination record if obtained from local pharmacy or Health Dept. Verbalized acceptance and understanding.  Pneumococcal vaccine status: Up to date  Covid-19  vaccine status: Information provided on how to obtain vaccines.   Qualifies for Shingles Vaccine? Yes   Zostavax completed No   Shingrix Completed?: No.    Education has been provided regarding the importance of this vaccine. Patient has been advised to call insurance company to determine out of pocket expense if they have not yet received this vaccine. Advised may also receive vaccine at local pharmacy or Health Dept. Verbalized acceptance and understanding.  Screening Tests Health Maintenance  Topic Date Due   Zoster Vaccines- Shingrix (1 of 2) Never done   DTaP/Tdap/Td (2 - Td or Tdap) 12/17/2021   INFLUENZA VACCINE  04/01/2023   COVID-19 Vaccine (5 - 2023-24 season) 05/02/2023   Colonoscopy  02/12/2030   Pneumonia Vaccine 71+ Years old  Completed   Hepatitis C Screening  Completed   HPV VACCINES  Aged Out    Health Maintenance  Health Maintenance Due  Topic Date Due   Zoster Vaccines- Shingrix (1 of 2) Never done   DTaP/Tdap/Td (2 - Td or Tdap) 12/17/2021   INFLUENZA VACCINE  04/01/2023   COVID-19 Vaccine (5 - 2023-24 season) 05/02/2023    Colorectal cancer screening: Type of screening: Colonoscopy. Completed 02/13/20. Repeat every 10 years  Lung Cancer Screening: (Low Dose CT Chest recommended if Age 56-80 years, 20 pack-year currently smoking OR have quit w/in 15years.) does not qualify.   Additional Screening:  Hepatitis C Screening: does qualify; Completed 06/16/21  Vision Screening: Recommended annual ophthalmology exams for early detection of glaucoma and other disorders of the eye. Is the patient up to date with their annual eye exam?  No  Who is the provider or what is the name of the office in which the patient attends annual eye exams? Dr. Dione Booze If pt is not established with a provider, would they like to be referred to a provider to establish care? No .   Dental Screening: Recommended annual dental exams for proper oral hygiene  Diabetic Foot Exam:  N/a  Community Resource Referral / Chronic Care Management: CRR required this visit?  No   CCM required this visit?  No     Plan:     I have personally reviewed and noted the following in the patient's chart:   Medical and social history Use of alcohol, tobacco or illicit drugs  Current medications and supplements including opioid prescriptions. Patient is currently taking opioid prescriptions. Information provided to patient regarding non-opioid alternatives. Patient advised to discuss non-opioid treatment plan with their provider. Functional ability and status Nutritional status Physical activity Advanced directives List of other physicians Hospitalizations, surgeries, and ER visits in previous 12 months Vitals Screenings to include cognitive, depression, and falls Referrals and appointments  In addition, I have reviewed and discussed with patient certain preventive protocols, quality metrics, and best practice recommendations. A written personalized care  plan for preventive services as well as general preventive health recommendations were provided to patient.     Donne Anon, CMA   07/20/2023   After Visit Summary: (MyChart) Due to this being a telephonic visit, the after visit summary with patients personalized plan was offered to patient via MyChart   Nurse Notes: None

## 2023-07-20 NOTE — Patient Instructions (Signed)
Collin Young , Thank you for taking time to come for your Medicare Wellness Visit. I appreciate your ongoing commitment to your health goals. Please review the following plan we discussed and let me know if I can assist you in the future.     This is a list of the screening recommended for you and due dates:  Health Maintenance  Topic Date Due   Zoster (Shingles) Vaccine (1 of 2) Never done   DTaP/Tdap/Td vaccine (2 - Td or Tdap) 12/17/2021   COVID-19 Vaccine (5 - 2023-24 season) 05/02/2023   Flu Shot  11/29/2023*   Colon Cancer Screening  02/12/2030   Pneumonia Vaccine  Completed   Hepatitis C Screening  Completed   HPV Vaccine  Aged Out  *Topic was postponed. The date shown is not the original due date.    Next appointment: Follow up in one year for your annual wellness visit.   Preventive Care 62 Years and Older, Male Preventive care refers to lifestyle choices and visits with your health care provider that can promote health and wellness. What does preventive care include? A yearly physical exam. This is also called an annual well check. Dental exams once or twice a year. Routine eye exams. Ask your health care provider how often you should have your eyes checked. Personal lifestyle choices, including: Daily care of your teeth and gums. Regular physical activity. Eating a healthy diet. Avoiding tobacco and drug use. Limiting alcohol use. Practicing safe sex. Taking low doses of aspirin every day. Taking vitamin and mineral supplements as recommended by your health care provider. What happens during an annual well check? The services and screenings done by your health care provider during your annual well check will depend on your age, overall health, lifestyle risk factors, and family history of disease. Counseling  Your health care provider may ask you questions about your: Alcohol use. Tobacco use. Drug use. Emotional well-being. Home and relationship  well-being. Sexual activity. Eating habits. History of falls. Memory and ability to understand (cognition). Work and work Astronomer. Screening  You may have the following tests or measurements: Height, weight, and BMI. Blood pressure. Lipid and cholesterol levels. These may be checked every 5 years, or more frequently if you are over 55 years old. Skin check. Lung cancer screening. You may have this screening every year starting at age 65 if you have a 30-pack-year history of smoking and currently smoke or have quit within the past 15 years. Fecal occult blood test (FOBT) of the stool. You may have this test every year starting at age 61. Flexible sigmoidoscopy or colonoscopy. You may have a sigmoidoscopy every 5 years or a colonoscopy every 10 years starting at age 20. Prostate cancer screening. Recommendations will vary depending on your family history and other risks. Hepatitis C blood test. Hepatitis B blood test. Sexually transmitted disease (STD) testing. Diabetes screening. This is done by checking your blood sugar (glucose) after you have not eaten for a while (fasting). You may have this done every 1-3 years. Abdominal aortic aneurysm (AAA) screening. You may need this if you are a current or former smoker. Osteoporosis. You may be screened starting at age 47 if you are at high risk. Talk with your health care provider about your test results, treatment options, and if necessary, the need for more tests. Vaccines  Your health care provider may recommend certain vaccines, such as: Influenza vaccine. This is recommended every year. Tetanus, diphtheria, and acellular pertussis (Tdap, Td) vaccine. You  may need a Td booster every 10 years. Zoster vaccine. You may need this after age 34. Pneumococcal 13-valent conjugate (PCV13) vaccine. One dose is recommended after age 40. Pneumococcal polysaccharide (PPSV23) vaccine. One dose is recommended after age 40. Talk to your health care  provider about which screenings and vaccines you need and how often you need them. This information is not intended to replace advice given to you by your health care provider. Make sure you discuss any questions you have with your health care provider. Document Released: 09/13/2015 Document Revised: 05/06/2016 Document Reviewed: 06/18/2015 Elsevier Interactive Patient Education  2017 [employer].  Fall Prevention in the Home Falls can cause injuries. They can happen to people of all ages. There are many things you can do to make your home safe and to help prevent falls. What can I do on the outside of my home? Regularly fix the edges of walkways and driveways and fix any cracks. Remove anything that might make you trip as you walk through a door, such as a raised step or threshold. Trim any bushes or trees on the path to your home. Use bright outdoor lighting. Clear any walking paths of anything that might make someone trip, such as rocks or tools. Regularly check to see if handrails are loose or broken. Make sure that both sides of any steps have handrails. Any raised decks and porches should have guardrails on the edges. Have any leaves, snow, or ice cleared regularly. Use sand or salt on walking paths during winter. Clean up any spills in your garage right away. This includes oil or grease spills. What can I do in the bathroom? Use night lights. Install grab bars by the toilet and in the tub and shower. Do not use towel bars as grab bars. Use non-skid mats or decals in the tub or shower. If you need to sit down in the shower, use a plastic, non-slip stool. Keep the floor dry. Clean up any water that spills on the floor as soon as it happens. Remove soap buildup in the tub or shower regularly. Attach bath mats securely with double-sided non-slip rug tape. Do not have throw rugs and other things on the floor that can make you trip. What can I do in the bedroom? Use night lights. Make  sure that you have a light by your bed that is easy to reach. Do not use any sheets or blankets that are too big for your bed. They should not hang down onto the floor. Have a firm chair that has side arms. You can use this for support while you get dressed. Do not have throw rugs and other things on the floor that can make you trip. What can I do in the kitchen? Clean up any spills right away. Avoid walking on wet floors. Keep items that you use a lot in easy-to-reach places. If you need to reach something above you, use a strong step stool that has a grab bar. Keep electrical cords out of the way. Do not use floor polish or wax that makes floors slippery. If you must use wax, use non-skid floor wax. Do not have throw rugs and other things on the floor that can make you trip. What can I do with my stairs? Do not leave any items on the stairs. Make sure that there are handrails on both sides of the stairs and use them. Fix handrails that are broken or loose. Make sure that handrails are as long as the  stairways. Check any carpeting to make sure that it is firmly attached to the stairs. Fix any carpet that is loose or worn. Avoid having throw rugs at the top or bottom of the stairs. If you do have throw rugs, attach them to the floor with carpet tape. Make sure that you have a light switch at the top of the stairs and the bottom of the stairs. If you do not have them, ask someone to add them for you. What else can I do to help prevent falls? Wear shoes that: Do not have high heels. Have rubber bottoms. Are comfortable and fit you well. Are closed at the toe. Do not wear sandals. If you use a stepladder: Make sure that it is fully opened. Do not climb a closed stepladder. Make sure that both sides of the stepladder are locked into place. Ask someone to hold it for you, if possible. Clearly mark and make sure that you can see: Any grab bars or handrails. First and last steps. Where the  edge of each step is. Use tools that help you move around (mobility aids) if they are needed. These include: Canes. Walkers. Scooters. Crutches. Turn on the lights when you go into a dark area. Replace any light bulbs as soon as they burn out. Set up your furniture so you have a clear path. Avoid moving your furniture around. If any of your floors are uneven, fix them. If there are any pets around you, be aware of where they are. Review your medicines with your doctor. Some medicines can make you feel dizzy. This can increase your chance of falling. Ask your doctor what other things that you can do to help prevent falls. This information is not intended to replace advice given to you by your health care provider. Make sure you discuss any questions you have with your health care provider. Document Released: 06/13/2009 Document Revised: 01/23/2016 Document Reviewed: 09/21/2014 Elsevier Interactive Patient Education  2017 [employer].

## 2023-07-28 ENCOUNTER — Ambulatory Visit
Admission: RE | Admit: 2023-07-28 | Discharge: 2023-07-28 | Disposition: A | Discharge status: Home / Self Care | Payer: 59 | Source: Ambulatory Visit | Attending: Vascular Surgery | Admitting: Vascular Surgery

## 2023-07-28 ENCOUNTER — Encounter: Payer: Self-pay | Admitting: Hematology & Oncology

## 2023-07-28 DIAGNOSIS — I70213 Atherosclerosis of native arteries of extremities with intermittent claudication, bilateral legs: Secondary | ICD-10-CM

## 2023-07-28 MED ORDER — IOPAMIDOL (ISOVUE-370) INJECTION 76%
100.0000 mL | Freq: Once | INTRAVENOUS | Status: AC | PRN
  Administered 2023-07-28: 100 mL via INTRAVENOUS

## 2023-07-30 ENCOUNTER — Encounter: Payer: Self-pay | Admitting: Hematology & Oncology

## 2023-07-30 NOTE — Progress Notes (Unsigned)
Collin Young [address] [place], [place] [address] [phone]  Date:  08/02/2023   Name:  Collin Young   DOB:  16-May-1951   MRN:  [id number]  PCP:  Pearline Cables, MD    Chief Complaint: No chief complaint on file.   History of Present Illness:  Collin Young is a 72 y.o. very pleasant male patient who presents with the following:  Patient seen today for follow-up visit and flu vaccine-  chronic combined systolic and diastolic CHF/ ischemic CM, severe multivessel CAD treated medically, 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  They did check a PSA for him  Seen by heart failure clinic in May -Most recent echo shows EF 35 to 40% -PE diagnosed 10/22, continuing Eliquis  He was seen by vascular surgery 10/30 to discuss claudication-he just had a CT angiogram for further evaluation on 11/27 but I do not believe this has been read as of yet  Flu vaccine Recommend Shingrix, COVID booster  Patient Active Problem List   Diagnosis Date Noted   Iron deficiency anemia due to chronic blood loss 11/14/2021   Acute on chronic systolic CHF (congestive heart failure) (HCC) 07/07/2021   COPD (chronic obstructive pulmonary disease) (HCC)    Hyponatremia    SOB (shortness of breath)    DNR (do not resuscitate)    Weakness generalized    Elevated LFTs    Pulmonary embolism (HCC) 06/11/2021   Acute on chronic heart failure (HCC) 06/02/2021   GERD without esophagitis 05/08/2021   Mixed  hyperlipidemia 05/08/2021   Hypertensive urgency 05/08/2021   Elevated troponin level not due myocardial infarction 05/08/2021   Pure hypercholesterolemia 09/23/2020   Claudication in peripheral vascular disease (HCC) 01/25/2020   Late latent syphilis 01/15/2020   Coronary artery disease involving native coronary artery of native heart 07/05/2019   Chronic kidney disease, stage 3a (HCC) 07/05/2019   Combined systolic and diastolic heart failure (HCC) 09/26/2018   Malignant neoplasm of prostate (HCC) 06/17/2018   Medication management    Dysphagia    Palliative care by specialist    Ischemic cardiomyopathy    Congestive dilated cardiomyopathy (HCC)    Acute on chronic systolic congestive heart failure (HCC) 01/02/2017   Malignant neoplasm of pyriform sinus (HCC) 09/28/2013   Piriform sinus tumor 09/24/2013   NONSPECIFIC ABN FINDING RAD & OTH EXAM GI TRACT 05/14/2009    Past Medical History:  Diagnosis Date   Chronic combined systolic and diastolic CHF (congestive heart failure) (HCC)    followed by dr t. Duke Salvia   COPD (chronic obstructive pulmonary disease) (HCC)    Coronary artery disease cardiologist-- dr Chilton Si   per cardiac cath 01-05-2017  chronic total occlusion pLCx with collaterals, 99% severe calcified prox. to mid RCA, otherwise mild to moderate CAD (medically managed)   Esophageal cancer, stage IIIB [facility]) oncologist-  dr ennever/  dr moody   dx 2010  SCC Stage IIIB completed chemoradiation;  localized recurrent left piriform  sinus 01/ 2015,  completed concurrent chemoradiatoin 04/ 2015   GERD (gastroesophageal reflux disease)    09-07-2018   no issues since Gtube removed 06/ 2019   History of alcohol abuse    quit 2001   History of cancer chemotherapy    2010;   2015   History of radiation therapy    10-19-2013 to  12-05-2013 pyriform sinus 69.96 Gy/43fx;   Radiation completed 2010 for esophageal cancer   History of seizure 2001   alcohol withdrawal   HOH  (hard of hearing)    Hyperlipidemia    Hypertension    Iron deficiency anemia due to chronic blood loss 11/14/2021   Ischemic cardiomyopathy 12/2016   01-03-2017  echo,  ef 10-15%/   echo 05-06-2017 EF improved to 45-50%   Prostate cancer [facility]) urologist-  dr ottelin/  oncologist-- dr Kathrynn Running   dx 05-27-2018--- Stage T1c,  Gleason 4+3,  PSA 9.3--  scheduled for brachytherapy 09-23-2017   Renal cyst, left     Past Surgical History:  Procedure Laterality Date   BIOPSY  12/18/2021   Procedure: BIOPSY;  Surgeon: Rachael Fee, MD;  Location: [facility];  Service: Gastroenterology;;   CARDIOVASCULAR STRESS TEST  10/09/09   normal nuclearr stress test, EF 57% Adolph Pollack)   CENTRAL LINE INSERTION  06/16/2021   Procedure: CENTRAL LINE INSERTION;  Surgeon: Dolores Patty, MD;  Location: [facility];  Service: Cardiovascular;;   ESOPHAGOGASTRODUODENOSCOPY (EGD) WITH PROPOFOL N/A 12/18/2021   Procedure: ESOPHAGOGASTRODUODENOSCOPY (EGD) WITH PROPOFOL;  Surgeon: Rachael Fee, MD;  Location: [facility];  Service: Gastroenterology;  Laterality: N/A;   IR GASTROSTOMY TUBE MOD SED  01/13/2017   IR GASTROSTOMY TUBE REMOVAL  02/03/2018   IR PATIENT EVAL TECH 0-60 MINS  03/25/2017   IR REMOVAL TUN ACCESS W/ PORT W/O FL MOD SED  01/15/2017   IR REPLACE G-TUBE SIMPLE WO FLUORO  01/13/2018   LARYNGOSCOPY N/A 09/15/2013   Procedure: LARYNGOSCOPY;  Surgeon: Christia Reading, MD;  Location: [facility];  Service: ENT;  Laterality: N/A;  direct laryngoscopy with biopsy and esophagoscopy   RADIOACTIVE SEED IMPLANT N/A 09/23/2018   Procedure: RADIOACTIVE SEED IMPLANT/BRACHYTHERAPY IMPLANT;  Surgeon: Ihor Gully, MD;  Location: [facility];  Service: Urology;  Laterality: N/A;   RIGHT HEART CATH N/A 06/16/2021   Procedure: RIGHT HEART CATH;  Surgeon: Dolores Patty, MD;  Location: [facility];  Service: Cardiovascular;  Laterality: N/A;   RIGHT/LEFT HEART CATH AND CORONARY ANGIOGRAPHY  N/A 01/05/2017   Procedure: Right/Left Heart Cath and Coronary Angiography;  Surgeon: Kathleene Hazel, MD;  Location: [facility];  Service: Cardiovascular;  Laterality: N/A;   TRANSTHORACIC ECHOCARDIOGRAM  05/06/2017   ef 45-50%,  grade 1 diastolic dysfunction/  AV severe calcified non coronary cusp with moderate regurg. , no stenosis (valve area per echo 01-05-2017 1.08cm^2)/  mild MR, TR, and PR/  mild LAE    Social History   Tobacco Use   Smoking status: Former    Current packs/day: 0.00    Average packs/day: 1 pack/day for 48.8 years (48.8 ttl pk-yrs)    Types: Cigarettes    Start date: 11/08/1960    Quit date: 08/31/2009    Years since quitting: 13.9   Smokeless tobacco: Former    Types: Chew    Quit date: 09/01/1999  Vaping Use   Vaping status: Never Used  Substance Use Topics   Alcohol use: Not Currently    Alcohol/week:  0.0 standard drinks of alcohol    Comment: quit in 2001   Drug use: No    Comment: back in the day used cocaine,alcohol, marijuana    Family History  Problem Relation Age of Onset   Breast cancer Mother    Prostate cancer Neg Hx    Colon cancer Neg Hx    Pancreatic cancer Neg Hx     No Known Allergies  Medication list has been reviewed and updated.  Current Outpatient Medications on File Prior to Visit  Medication Sig Dispense Refill   albuterol (VENTOLIN HFA) 108 (90 Base) MCG/ACT inhaler Inhale 2 puffs into the lungs every 6 (six) hours as needed for wheezing or shortness of breath. 18 g 5   apixaban (ELIQUIS) 5 MG TABS tablet Take 1 tablet (5 mg total) by mouth 2 (two) times daily. 60 tablet 5   BAYER LOW DOSE 81 MG EC tablet Take 81 mg by mouth daily. Swallow whole.     CORLANOR 5 MG TABS tablet TAKE 1/2 TABLET(2.5 MG) BY MOUTH TWICE DAILY WITH A MEAL 45 tablet 3   dapagliflozin propanediol (FARXIGA) 10 MG TABS tablet Take 1 tablet (10 mg total) by mouth daily. 90 tablet 3   Evolocumab (REPATHA SURECLICK) 140 MG/ML SOAJ Inject 140  mg into the skin every 14 (fourteen) days. Please keep your upcoming appointment for refills. 6 mL 3   FARXIGA 10 MG TABS tablet TAKE 1 TABLET(10 MG) BY MOUTH DAILY 90 tablet 3   fentaNYL (DURAGESIC) 12 MCG/HR Place 1 patch onto the skin every 3 (three) days. 10 patch 0   furosemide (LASIX) 40 MG tablet Take 1 tablet (40 mg total) by mouth as needed. 30 tablet 3   ipratropium (ATROVENT) 0.03 % nasal spray Place 2 sprays into both nostrils every 12 (twelve) hours. 30 mL 12   polyethylene glycol (MIRALAX / GLYCOLAX) 17 g packet Take 17 g by mouth daily as needed for mild constipation. 14 each 0   pravastatin (PRAVACHOL) 80 MG tablet TAKE 1 TABLET(80 MG) BY MOUTH EVERY EVENING 90 tablet 3   sacubitril-valsartan (ENTRESTO) 24-26 MG Take 1 tablet by mouth 2 (two) times daily. 60 tablet 0   spironolactone (ALDACTONE) 25 MG tablet Take 1 tablet (25 mg total) by mouth as needed. 90 tablet 3   umeclidinium bromide (INCRUSE ELLIPTA) 62.5 MCG/ACT AEPB Inhale 1 puff into the lungs daily. 30 each 6   Current Facility-Administered Medications on File Prior to Visit  Medication Dose Route Frequency Provider Last Rate Last Admin   topical emolient (BIAFINE) emulsion   Topical Daily Dorothy Puffer, MD   Given at 01/19/14 1610    Review of Systems:  As per HPI- otherwise negative.   Physical Examination: There were no vitals filed for this visit. There were no vitals filed for this visit. There is no height or weight on file to calculate BMI. Ideal Body Weight:    GEN: no acute distress. HEENT: Atraumatic, Normocephalic.  Ears and Nose: No external deformity. CV: RRR, No M/G/R. No JVD. No thrill. No extra heart sounds. PULM: CTA B, no wheezes, crackles, rhonchi. No retractions. No resp. distress. No accessory muscle use. ABD: S, NT, ND, +BS. No rebound. No HSM. EXTR: No c/c/e PSYCH: Normally interactive. Conversant.    Assessment and Plan: ***  Signed Abbe Amsterdam, MD

## 2023-08-02 ENCOUNTER — Other Ambulatory Visit: Payer: Self-pay

## 2023-08-02 ENCOUNTER — Ambulatory Visit: Payer: 59 | Admitting: Family Medicine

## 2023-08-02 DIAGNOSIS — J449 Chronic obstructive pulmonary disease, unspecified: Secondary | ICD-10-CM

## 2023-08-02 DIAGNOSIS — C61 Malignant neoplasm of prostate: Secondary | ICD-10-CM

## 2023-08-02 DIAGNOSIS — I5040 Unspecified combined systolic (congestive) and diastolic (congestive) heart failure: Secondary | ICD-10-CM

## 2023-08-02 DIAGNOSIS — C12 Malignant neoplasm of pyriform sinus: Secondary | ICD-10-CM

## 2023-08-02 DIAGNOSIS — I739 Peripheral vascular disease, unspecified: Secondary | ICD-10-CM

## 2023-08-02 MED ORDER — FENTANYL 12 MCG/HR TD PT72: 1.0000 | MEDICATED_PATCH | TRANSDERMAL | 0 refills | Status: DC

## 2023-08-05 NOTE — Progress Notes (Signed)
[name] [address], [address] [place], [place] [address] [phone]  Date:  08/09/2023   Name:  Collin Young   DOB:  [birth year]   MRN:  [id number]  PCP:  Pearline Cables, MD    Chief Complaint: Follow-up (Concerns/ questions: pt would like to go over recent results of tests on his legs/Flu shot today: unsure/Shringrix: Medicare pt/Tdap: )   History of Present Illness:  Collin Young is a 72 y.o. very pleasant male patient who presents with the following:  Patient seen today for follow-up visit and flu vaccine-  chronic combined systolic and diastolic CHF/ ischemic CM, severe multivessel CAD treated medically, 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  They did check a PSA for him  Seen by heart failure clinic in May -Most recent echo shows EF 35 to 40% -PE diagnosed 10/22, continuing Eliquis  He was seen by vascular surgery 10/30 to discuss claudication-he just had a CT angiogram for further evaluation on 11/27 but I do not believe this has been read as of yet- update, report is in, went over with pt briefly. Advised vascular surgery will call him   Flu vaccine- give today  Recommend Shingrix, COVID booster  Patient Active Problem List   Diagnosis Date Noted   Iron deficiency anemia due to chronic blood loss 11/14/2021   Acute on chronic systolic CHF (congestive heart failure) (HCC) 07/07/2021   COPD (chronic obstructive pulmonary disease) (HCC)    Hyponatremia    SOB (shortness  of breath)    DNR (do not resuscitate)    Weakness generalized    Elevated LFTs    Pulmonary embolism (HCC) 06/11/2021   Acute on chronic heart failure (HCC) 06/02/2021   GERD without esophagitis 05/08/2021   Mixed hyperlipidemia 05/08/2021   Hypertensive urgency 05/08/2021   Elevated troponin level not due myocardial infarction 05/08/2021   Pure hypercholesterolemia 09/23/2020   Claudication in peripheral vascular disease (HCC) 01/25/2020   Late latent syphilis 01/15/2020   Coronary artery disease involving native coronary artery of native heart 07/05/2019   Chronic kidney disease, stage 3a (HCC) 07/05/2019   Combined systolic and diastolic heart failure (HCC) 09/26/2018   Malignant neoplasm of prostate (HCC) 06/17/2018   Medication management    Dysphagia    Palliative care by specialist    Ischemic cardiomyopathy    Congestive dilated cardiomyopathy (HCC)    Acute on chronic systolic congestive heart failure (HCC) 01/02/2017   Malignant neoplasm of pyriform sinus (HCC) 09/28/2013   Piriform sinus tumor 09/24/2013   NONSPECIFIC ABN FINDING RAD & OTH EXAM GI TRACT 05/14/2009    Past Medical History:  Diagnosis Date   Chronic combined systolic and diastolic CHF (congestive heart failure) (HCC)    followed by dr t. Duke Salvia   COPD (chronic obstructive pulmonary disease) (HCC)    Coronary artery disease cardiologist-- dr Chilton Si   per cardiac cath 01-05-2017  chronic total occlusion pLCx with collaterals, 99% severe calcified prox. to mid RCA,  otherwise mild to moderate CAD (medically managed)   Esophageal cancer, stage IIIB [facility]) oncologist-  dr ennever/  dr moody   dx 2010  SCC Stage IIIB completed chemoradiation;  localized recurrent left piriform sinus 01/ 2015,  completed concurrent chemoradiatoin 04/ 2015   GERD (gastroesophageal reflux disease)    09-07-2018   no issues since Gtube removed 06/ 2019   History of alcohol abuse    quit 2001   History of cancer  chemotherapy    2010;   2015   History of radiation therapy    10-19-2013 to  12-05-2013 pyriform sinus 69.96 Gy/94fx;   Radiation completed 2010 for esophageal cancer   History of seizure 2001   alcohol withdrawal   HOH (hard of hearing)    Hyperlipidemia    Hypertension    Iron deficiency anemia due to chronic blood loss 11/14/2021   Ischemic cardiomyopathy 12/2016   01-03-2017  echo,  ef 10-15%/   echo 05-06-2017 EF improved to 45-50%   Prostate cancer [facility]) urologist-  dr ottelin/  oncologist-- dr Kathrynn Running   dx 05-27-2018--- Stage T1c,  Gleason 4+3,  PSA 9.3--  scheduled for brachytherapy 09-23-2017   Renal cyst, left     Past Surgical History:  Procedure Laterality Date   BIOPSY  12/18/2021   Procedure: BIOPSY;  Surgeon: Rachael Fee, MD;  Location: [facility];  Service: Gastroenterology;;   CARDIOVASCULAR STRESS TEST  10/09/09   normal nuclearr stress test, EF 57% Adolph Pollack)   CENTRAL LINE INSERTION  06/16/2021   Procedure: CENTRAL LINE INSERTION;  Surgeon: Dolores Patty, MD;  Location: [facility]AS[facility] CV [facility];  Service: Cardiovascular;;   ESOPHAGOGASTRODUODENOSCOPY (EGD) WITH PROPOFOL N/A 12/18/2021   Procedure: ESOPHAGOGASTRODUODENOSCOPY (EGD) WITH PROPOFOL;  Surgeon: Rachael Fee, MD;  Location: [facility];  Service: Gastroenterology;  Laterality: N/A;   IR GASTROSTOMY TUBE MOD SED  01/13/2017   IR GASTROSTOMY TUBE REMOVAL  02/03/2018   IR PATIENT EVAL TECH 0-60 MINS  03/25/2017   IR REMOVAL TUN ACCESS W/ PORT W/O FL MOD SED  01/15/2017   IR REPLACE G-TUBE SIMPLE WO FLUORO  01/13/2018   LARYNGOSCOPY N/A 09/15/2013   Procedure: LARYNGOSCOPY;  Surgeon: Christia Reading, MD;  Location: [facility] OR;  Service: ENT;  Laterality: N/A;  direct laryngoscopy with biopsy and esophagoscopy   RADIOACTIVE SEED IMPLANT N/A 09/23/2018   Procedure: RADIOACTIVE SEED IMPLANT/BRACHYTHERAPY IMPLANT;  Surgeon: Ihor Gully, MD;  Location: [facility];  Service: Urology;  Laterality: N/A;    RIGHT HEART CATH N/A 06/16/2021   Procedure: RIGHT HEART CATH;  Surgeon: Dolores Patty, MD;  Location: [facility] CV [facility];  Service: Cardiovascular;  Laterality: N/A;   RIGHT/LEFT HEART CATH AND CORONARY ANGIOGRAPHY N/A 01/05/2017   Procedure: Right/Left Heart Cath and Coronary Angiography;  Surgeon: Kathleene Hazel, MD;  Location[facility] INVASIVE CV LAB;  Service: Cardiovascular;  Laterality: N/A;   TRANSTHORACIC ECHOCARDIOGRAM  05/06/2017   ef 45-50%,  grade 1 diastolic dysfunction/  AV severe calcified non coronary cusp with moderate regurg. , no stenosis (valve area per echo 01-05-2017 1.08cm^2)/  mild MR, TR, and PR/  mild LAE    Social History   Tobacco Use   Smoking status: Former    Current packs/day: 0.00    Average packs/day: 1 pack/day for 48.8 years (48.8 ttl pk-yrs)    Types: Cigarettes    Start date: 11/08/1960    Quit date: 08/31/2009    Years since quitting: 13.9   Smokeless  tobacco: Former    Types: Chew    Quit date: 09/01/1999  Vaping Use   Vaping status: Never Used  Substance Use Topics   Alcohol use: Not Currently    Alcohol/week: 0.0 standard drinks of alcohol    Comment: quit in 2001   Drug use: No    Comment: back in the day used cocaine,alcohol, marijuana    Family History  Problem Relation Age of Onset   Breast cancer Mother    Prostate cancer Neg Hx    Colon cancer Neg Hx    Pancreatic cancer Neg Hx     No Known Allergies  Medication list has been reviewed and updated.  Current Outpatient Medications on File Prior to Visit  Medication Sig Dispense Refill   albuterol (VENTOLIN HFA) 108 (90 Base) MCG/ACT inhaler Inhale 2 puffs into the lungs every 6 (six) hours as needed for wheezing or shortness of breath. 18 g 5   apixaban (ELIQUIS) 5 MG TABS tablet Take 1 tablet (5 mg total) by mouth 2 (two) times daily. 60 tablet 5   BAYER LOW DOSE 81 MG EC tablet Take 81 mg by mouth daily. Swallow whole.     CORLANOR 5 MG TABS tablet TAKE 1/2  TABLET(2.5 MG) BY MOUTH TWICE DAILY WITH A MEAL 45 tablet 3   dapagliflozin propanediol (FARXIGA) 10 MG TABS tablet Take 1 tablet (10 mg total) by mouth daily. 90 tablet 3   Evolocumab (REPATHA SURECLICK) 140 MG/ML SOAJ Inject 140 mg into the skin every 14 (fourteen) days. Please keep your upcoming appointment for refills. 6 mL 3   fentaNYL (DURAGESIC) 12 MCG/HR Place 1 patch onto the skin every 3 (three) days. 10 patch 0   ipratropium (ATROVENT) 0.03 % nasal spray Place 2 sprays into both nostrils every 12 (twelve) hours. 30 mL 12   polyethylene glycol (MIRALAX / GLYCOLAX) 17 g packet Take 17 g by mouth daily as needed for mild constipation. 14 each 0   pravastatin (PRAVACHOL) 80 MG tablet TAKE 1 TABLET(80 MG) BY MOUTH EVERY EVENING 90 tablet 3   sacubitril-valsartan (ENTRESTO) 24-26 MG Take 1 tablet by mouth 2 (two) times daily. 60 tablet 0   spironolactone (ALDACTONE) 25 MG tablet Take 1 tablet (25 mg total) by mouth as needed. 90 tablet 3   umeclidinium bromide (INCRUSE ELLIPTA) 62.5 MCG/ACT AEPB Inhale 1 puff into the lungs daily. 30 each 6   furosemide (LASIX) 40 MG tablet Take 1 tablet (40 mg total) by mouth as needed. 30 tablet 3   Current Facility-Administered Medications on File Prior to Visit  Medication Dose Route Frequency Provider Last Rate Last Admin   topical emolient (BIAFINE) emulsion   Topical Daily Dorothy Puffer, MD   Given at 01/19/14 1610    Review of Systems:  As per HPI- otherwise negative.   Physical Examination: Vitals:   08/09/23 1257  BP: 120/72  Pulse: 95  Resp: 18  Temp: 97.9 F (36.6 C)  SpO2: 95%   Vitals:   08/09/23 1257  Weight: 135 lb (61.2 kg)  Height: 5\' 6"  (1.676 m)   Body mass index is 21.79 kg/m. Ideal Body Weight: Weight in (lb) to have BMI = 25: 154.6  GEN: no acute distress.  Normal weight, appears his usual self HEENT: Atraumatic, Normocephalic.  Ears and Nose: No external deformity. CV: RRR, No M/G/R. No JVD. No thrill. No extra  heart sounds. PULM: CTA B, no wheezes, crackles, rhonchi. No retractions. No resp. distress. No accessory muscle  use. ABD: S, NT, ND, +BS. No rebound. No HSM. EXTR: No c/c/e PSYCH: Normally interactive. Conversant.    Assessment and Plan: Pure hypercholesterolemia - Plan: pravastatin (PRAVACHOL) 80 MG tablet  Peripheral vascular disease (HCC)  Immunization due - Plan: Flu Vaccine Trivalent High Dose (Fluad)  Follow-up.  I refilled his cholesterol medication, gave flu shot.  We discussed his peripheral vascular disease, advised patient I suspect vascular surgery will be calling him soon to go over his results.  I asked him to see me in 4 months for routine blood work  Signed Abbe Amsterdam, MD

## 2023-08-09 ENCOUNTER — Encounter: Payer: Self-pay | Admitting: Vascular Surgery

## 2023-08-09 ENCOUNTER — Ambulatory Visit (INDEPENDENT_AMBULATORY_CARE_PROVIDER_SITE_OTHER): Payer: 59 | Admitting: Family Medicine

## 2023-08-09 ENCOUNTER — Other Ambulatory Visit: Payer: Self-pay

## 2023-08-09 VITALS — BP 120/72 | HR 95 | Temp 97.9°F | Resp 18 | Ht 66.0 in | Wt 135.0 lb

## 2023-08-09 DIAGNOSIS — Z23 Encounter for immunization: Secondary | ICD-10-CM

## 2023-08-09 DIAGNOSIS — I739 Peripheral vascular disease, unspecified: Secondary | ICD-10-CM | POA: Diagnosis not present

## 2023-08-09 DIAGNOSIS — E7800 Pure hypercholesterolemia, unspecified: Secondary | ICD-10-CM

## 2023-08-09 DIAGNOSIS — I70213 Atherosclerosis of native arteries of extremities with intermittent claudication, bilateral legs: Secondary | ICD-10-CM

## 2023-08-09 MED ORDER — PRAVASTATIN SODIUM 80 MG PO TABS: ORAL_TABLET | ORAL | 3 refills | Status: DC

## 2023-08-09 NOTE — Patient Instructions (Addendum)
It was good to see you today- your CT report did come back from Dr Randie Heinz.  I expect his office will call you soon!  Flu shot today  I refilled your cholesterol medication   Please see me in about 4 months to check in and do labs

## 2023-08-11 ENCOUNTER — Ambulatory Visit: Payer: 59 | Admitting: Vascular Surgery

## 2023-08-18 ENCOUNTER — Ambulatory Visit (HOSPITAL_COMMUNITY)
Admission: RE | Admit: 2023-08-18 | Discharge: 2023-08-18 | Disposition: A | Discharge status: Home / Self Care | Payer: 59 | Source: Ambulatory Visit | Attending: Vascular Surgery | Admitting: Vascular Surgery

## 2023-08-18 ENCOUNTER — Encounter: Payer: Self-pay | Admitting: Vascular Surgery

## 2023-08-18 ENCOUNTER — Ambulatory Visit (INDEPENDENT_AMBULATORY_CARE_PROVIDER_SITE_OTHER): Payer: 59 | Admitting: Vascular Surgery

## 2023-08-18 VITALS — BP 105/69 | HR 109 | Temp 98.3°F | Resp 20 | Ht 66.0 in | Wt 128.0 lb

## 2023-08-18 DIAGNOSIS — I6522 Occlusion and stenosis of left carotid artery: Secondary | ICD-10-CM | POA: Insufficient documentation

## 2023-08-18 DIAGNOSIS — I70213 Atherosclerosis of native arteries of extremities with intermittent claudication, bilateral legs: Secondary | ICD-10-CM | POA: Diagnosis not present

## 2023-08-18 NOTE — Progress Notes (Signed)
Patient ID: Collin Young, male   DOB: [birth year], 72 y.o.   MRN: [id number]  Reason for Consult: Follow-up   Referred by Copland, Gwenlyn Found, MD  Subjective:     HPI:  Collin Young is a 72 y.o. male History of bilateral lower extremity claudication on last visit no femoral pulses were palpable and he was sent for CTA.  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  Denies tissue loss or ulceration.  No previous stroke, TIA or amaurosis.  He did have radiation to his neck in the past for cancer and he was a previous smoker but has been quit for many years.  Past Medical History:  Diagnosis Date   Chronic combined systolic and diastolic CHF (congestive heart failure) (HCC)    followed by dr t. Duke Salvia   COPD (chronic obstructive pulmonary disease) (HCC)    Coronary artery disease cardiologist-- dr Chilton Si   per cardiac cath 01-05-2017  chronic total occlusion pLCx with collaterals, 99% severe calcified prox. to mid RCA, otherwise mild to moderate CAD (medically managed)   Esophageal cancer, stage IIIB [facility]) oncologist-  dr ennever/  dr moody   dx 2010  SCC Stage IIIB completed chemoradiation;  localized recurrent left piriform sinus 01/ 2015,  completed concurrent chemoradiatoin 04/ 2015   GERD (gastroesophageal reflux disease)    09-07-2018   no issues since Gtube removed 06/ 2019   History of alcohol abuse    quit 2001   History of cancer chemotherapy    2010;   2015   History of radiation therapy    10-19-2013 to  12-05-2013 pyriform sinus 69.96 Gy/35fx;   Radiation completed 2010 for esophageal cancer   History of seizure 2001   alcohol withdrawal   HOH (hard of hearing)    Hyperlipidemia    Hypertension    Iron deficiency anemia due to chronic blood loss 11/14/2021   Ischemic cardiomyopathy 12/2016   01-03-2017  echo,  ef 10-15%/    echo 05-06-2017 EF improved to 45-50%   Prostate cancer [facility]) urologist-  dr ottelin/  oncologist-- dr Kathrynn Running   dx 05-27-2018--- Stage T1c,  Gleason 4+3,  PSA 9.3--  scheduled for brachytherapy 09-23-2017   Renal cyst, left    Family History  Problem Relation Age of Onset   Breast cancer Mother    Prostate cancer Neg Hx    Colon cancer Neg Hx    Pancreatic cancer Neg Hx    Past Surgical History:  Procedure Laterality Date   BIOPSY  12/18/2021   Procedure: BIOPSY;  Surgeon: Rachael Fee, MD;  Location: [facility];  Service: Gastroenterology;;   CARDIOVASCULAR STRESS TEST  10/09/09   normal nuclearr stress test, EF 57% Adolph Pollack)   CENTRAL LINE INSERTION  06/16/2021   Procedure: CENTRAL LINE INSERTION;  Surgeon: Dolores Patty, MD;  Location: [facility];  Service: Cardiovascular;;   ESOPHAGOGASTRODUODENOSCOPY (EGD) WITH PROPOFOL N/A 12/18/2021   Procedure: ESOPHAGOGASTRODUODENOSCOPY (EGD) WITH PROPOFOL;  Surgeon: Rachael Fee, MD;  Location: [facility];  Service: Gastroenterology;  Laterality: N/A;   IR GASTROSTOMY TUBE MOD SED  01/13/2017   IR GASTROSTOMY TUBE REMOVAL  02/03/2018   IR PATIENT EVAL TECH 0-60 MINS  03/25/2017   IR REMOVAL TUN ACCESS W/ PORT W/O FL MOD SED  01/15/2017  IR REPLACE G-TUBE SIMPLE WO FLUORO  01/13/2018   LARYNGOSCOPY N/A 09/15/2013   Procedure: LARYNGOSCOPY;  Surgeon: Christia Reading, MD;  Location: [facility] OR;  Service: ENT;  Laterality: N/A;  direct laryngoscopy with biopsy and esophagoscopy   RADIOACTIVE SEED IMPLANT N/A 09/23/2018   Procedure: RADIOACTIVE SEED IMPLANT/BRACHYTHERAPY IMPLANT;  Surgeon: Ihor Gully, MD;  Location: [facility];  Service: Urology;  Laterality: N/A;   RIGHT HEART CATH N/A 06/16/2021   Procedure: RIGHT HEART CATH;  Surgeon: Dolores Patty, MD;  Location: [facility];  Service: Cardiovascular;  Laterality: N/A;   RIGHT/LEFT HEART CATH AND CORONARY ANGIOGRAPHY N/A 01/05/2017   Procedure:  Right/Left Heart Cath and Coronary Angiography;  Surgeon: Kathleene Hazel, MD;  Location: [facility]VASIVE CV LAB;  Service: Cardiovascular;  Laterality: N/A;   TRANSTHORACIC ECHOCARDIOGRAM  05/06/2017   ef 45-50%,  grade 1 diastolic dysfunction/  AV severe calcified non coronary cusp with moderate regurg. , no stenosis (valve area per echo 01-05-2017 1.08cm^2)/  mild MR, TR, and PR/  mild LAE    Short Social History:  Social History   Tobacco Use   Smoking status: Former    Current packs/day: 0.00    Average packs/day: 1 pack/day for 48.8 years (48.8 ttl pk-yrs)    Types: Cigarettes    Start date: 11/08/1960    Quit date: 08/31/2009    Years since quitting: 13.9   Smokeless tobacco: Former    Types: Chew    Quit date: 09/01/1999  Substance Use Topics   Alcohol use: Not Currently    Alcohol/week: 0.0 standard drinks of alcohol    Comment: quit in 2001    No Known Allergies  Current Outpatient Medications  Medication Sig Dispense Refill   albuterol (VENTOLIN HFA) 108 (90 Base) MCG/ACT inhaler Inhale 2 puffs into the lungs every 6 (six) hours as needed for wheezing or shortness of breath. 18 g 5   apixaban (ELIQUIS) 5 MG TABS tablet Take 1 tablet (5 mg total) by mouth 2 (two) times daily. 60 tablet 5   BAYER LOW DOSE 81 MG EC tablet Take 81 mg by mouth daily. Swallow whole.     CORLANOR 5 MG TABS tablet TAKE 1/2 TABLET(2.5 MG) BY MOUTH TWICE DAILY WITH A MEAL 45 tablet 3   dapagliflozin propanediol (FARXIGA) 10 MG TABS tablet Take 1 tablet (10 mg total) by mouth daily. 90 tablet 3   Evolocumab (REPATHA SURECLICK) 140 MG/ML SOAJ Inject 140 mg into the skin every 14 (fourteen) days. Please keep your upcoming appointment for refills. 6 mL 3   fentaNYL (DURAGESIC) 12 MCG/HR Place 1 patch onto the skin every 3 (three) days. 10 patch 0   ipratropium (ATROVENT) 0.03 % nasal spray Place 2 sprays into both nostrils every 12 (twelve) hours. 30 mL 12   polyethylene glycol (MIRALAX / GLYCOLAX)  17 g packet Take 17 g by mouth daily as needed for mild constipation. 14 each 0   pravastatin (PRAVACHOL) 80 MG tablet TAKE 1 TABLET(80 MG) BY MOUTH EVERY EVENING 90 tablet 3   sacubitril-valsartan (ENTRESTO) 24-26 MG Take 1 tablet by mouth 2 (two) times daily. 60 tablet 0   spironolactone (ALDACTONE) 25 MG tablet Take 1 tablet (25 mg total) by mouth as needed. 90 tablet 3   umeclidinium bromide (INCRUSE ELLIPTA) 62.5 MCG/ACT AEPB Inhale 1 puff into the lungs daily. 30 each 6   furosemide (LASIX) 40 MG tablet Take 1 tablet (40 mg total) by mouth as needed.  30 tablet 3   No current facility-administered medications for this visit.   Facility-Administered Medications Ordered in Other Visits  Medication Dose Route Frequency Provider Last Rate Last Admin   topical emolient (BIAFINE) emulsion   Topical Daily Dorothy Puffer, MD   Given at 01/19/14 5809    Review of Systems  Constitutional:  Constitutional negative. HENT: HENT negative.  Eyes: Eyes negative.  Cardiovascular: Positive for claudication.  GI: Gastrointestinal negative.  Musculoskeletal: Musculoskeletal negative.  Skin: Skin negative.  Neurological: Neurological negative. Hematologic: Hematologic/lymphatic negative.  Psychiatric: Psychiatric negative.        Objective:  Objective   Vitals:   08/18/23 1347  BP: 105/69  Pulse: (!) 109  Resp: 20  Temp: 98.3 F (36.8 C)  SpO2: 92%  Weight: 128 lb (58.1 kg)  Height: 5\' 6"  (1.676 m)   Body mass index is 20.66 kg/m.  Physical Exam HENT:     Head: Normocephalic.  Eyes:     Pupils: Pupils are equal, round, and reactive to light.  Neck:     Vascular: No carotid bruit.  Cardiovascular:     Rate and Rhythm: Normal rate.     Pulses:          Femoral pulses are 0 on the right side and 0 on the left side. Abdominal:     General: Abdomen is flat.  Musculoskeletal:        General: Normal range of motion.     Right lower leg: No edema.     Left lower leg: No edema.   Skin:    General: Skin is warm and dry.  Neurological:     General: No focal deficit present.     Mental Status: He is alert.  Psychiatric:        Mood and Affect: Mood normal.     Data: Right Carotid Findings:  +----------+--------+--------+--------+------------------+--------+           PSV cm/sEDV cm/sStenosisPlaque DescriptionComments  +----------+--------+--------+--------+------------------+--------+  CCA Prox  76      15                                          +----------+--------+--------+--------+------------------+--------+  CCA Mid   114     29              heterogenous                +----------+--------+--------+--------+------------------+--------+  CCA Distal124     36              heterogenous                +----------+--------+--------+--------+------------------+--------+  ICA Prox  132     25      1-39%   calcific                    +----------+--------+--------+--------+------------------+--------+  ICA Mid   119     38                                          +----------+--------+--------+--------+------------------+--------+  ICA Distal92      37                                          +----------+--------+--------+--------+------------------+--------+  ECA      121     0               heterogenous                +----------+--------+--------+--------+------------------+--------+   +----------+--------+-------+----------------+-------------------+           PSV cm/sEDV cmsDescribe        Arm Pressure (mmHG)  +----------+--------+-------+----------------+-------------------+  [id number]            Multiphasic, WNL                     +----------+--------+-------+----------------+-------------------+   +---------+--------+--+--------+-+---------------------------+  VertebralPSV cm/s13EDV cm/s0Antegrade and pre occlusive   +---------+--------+--+--------+-+---------------------------+      Left Carotid Findings:  +----------+--------+--------+--------+------------------+--------+           PSV cm/sEDV cm/sStenosisPlaque DescriptionComments  +----------+--------+--------+--------+------------------+--------+  CCA Prox  65      17              heterogenous                +----------+--------+--------+--------+------------------+--------+  CCA Mid   71      17              heterogenous                +----------+--------+--------+--------+------------------+--------+  CCA Distal93      27              heterogenous                +----------+--------+--------+--------+------------------+--------+  ICA Prox  325     79      60-79%  calcific                    +----------+--------+--------+--------+------------------+--------+  ICA Mid   148     46                                          +----------+--------+--------+--------+------------------+--------+  ICA Distal109     32                                          +----------+--------+--------+--------+------------------+--------+  ECA      156     22              heterogenous                +----------+--------+--------+--------+------------------+--------+   +----------+--------+--------+----------------+-------------------+           PSV cm/sEDV cm/sDescribe        Arm Pressure (mmHG)  +----------+--------+--------+----------------+-------------------+  [id number]            Multiphasic, WNL                     +----------+--------+--------+----------------+-------------------+   +---------+--------+--+--------+---------+  VertebralPSV cm/s33EDV cm/sAntegrade  +---------+--------+--+--------+---------+         Summary:  Right Carotid: Velocities in the right ICA are consistent with a 1-39%  stenosis.   Left Carotid: Velocities in the left ICA are consistent with a  60-79%  stenosis.   Vertebrals: Left vertebral artery demonstrates antegrade flow. The right               vertebral arery is diminished with pre occlusive waveform.  Subclavians: Normal flow hemodynamics were seen in bilateral subclavian               arteries.    CT IMPRESSION: Vascular Impression:   1. Moderate-to-large amount of slightly irregular mixed calcified and noncalcified atherosclerotic plaque within a normal caliber abdominal aorta, not resulting in hemodynamically significant stenosis. Aortic Atherosclerosis (ICD10-I70.0). 2. Suspected hemodynamically significant narrowing involving the origin of the right renal artery, without associated delayed renal enhancement or asymmetric renal atrophy.   Vascular Impression of the right lower extremity:   1. Short-segment severe (70%) luminal narrowing involving the distal aspect of the right external iliac artery. 2. Subtotal occlusion of the right common femoral artery. 3. Tandem areas of moderate to severe narrowing throughout the right superficial femoral artery with scattered areas of subtotal occlusion. 4. The right above and below-knee popliteal artery is of normal caliber and without hemodynamically significant narrowing. 5. Single-vessel runoff to the right lower leg and foot via the peroneal artery which reconstitutes the posterior artery vascular distribution at the level of the mortise. A patent right-sided dorsalis pedis artery is not identified. No evidence of distal embolism.   Vascular Impression of the left lower extremity.:   1. Short-segment severe (70%) luminal narrowing of the proximal aspect of the left external iliac artery. 2. Moderate-to-large amount of atherosclerotic plaque throughout the left superficial femoral artery resulting in scattered areas of subtotal occlusion. 3. The left above and below-knee popliteal arteries of these though of normal caliber and widely patent without a  hemodynamically significant narrowing. 4. Two-vessel runoff to the left lower leg and foot via the posterior tibial and peroneal arteries. A left-sided dorsalis pedis artery is identified. No evidence of distal embolism.   Nonvascular Impression:   1. Heterogeneous consolidative airspace opacities within the imaged bilateral lower lobes, worse within the medial basilar segment of the right lower lobe with associated air bronchograms, nonspecific though concerning for multifocal pneumonia and/or aspiration. Clinical correlation is advised. 2. Extensive colonic diverticulosis without evidence of superimposed acute diverticulitis. 3. Mild nodularity of the hepatic contour, nonspecific though could be seen in the setting of cirrhosis. Correlation with LFTs is advised. 4.  Emphysema (ICD10-J43.9).     Assessment/Plan:    72 year old male with bilateral carotid artery stenosis follow-up duplex today likely secondary to history of smoking and radiation.  He also has fairly short distance claudication but does not appear life-limiting at this time he does not have any rest pain or tissue loss as such I will have him follow-up in 6 months with repeat carotid duplex and ABIs.  Patient really does not want any procedures that are not absolutely necessary at this time with minimal claudication we will continue watchful waiting and he demonstrates good understanding the presence of his daughter.     Maeola Harman MD [facility]

## 2023-08-27 ENCOUNTER — Other Ambulatory Visit: Payer: Self-pay

## 2023-08-27 DIAGNOSIS — I6522 Occlusion and stenosis of left carotid artery: Secondary | ICD-10-CM

## 2023-08-27 DIAGNOSIS — I70213 Atherosclerosis of native arteries of extremities with intermittent claudication, bilateral legs: Secondary | ICD-10-CM

## 2023-08-30 ENCOUNTER — Other Ambulatory Visit: Payer: 59

## 2023-09-06 ENCOUNTER — Other Ambulatory Visit (HOSPITAL_COMMUNITY): Payer: Self-pay | Admitting: Internal Medicine

## 2023-09-08 ENCOUNTER — Other Ambulatory Visit: Payer: Self-pay

## 2023-09-08 ENCOUNTER — Ambulatory Visit: Payer: 59 | Admitting: Vascular Surgery

## 2023-09-08 DIAGNOSIS — C61 Malignant neoplasm of prostate: Secondary | ICD-10-CM

## 2023-09-08 DIAGNOSIS — C12 Malignant neoplasm of pyriform sinus: Secondary | ICD-10-CM

## 2023-09-08 MED ORDER — FENTANYL 12 MCG/HR TD PT72: 1.0000 | MEDICATED_PATCH | TRANSDERMAL | 0 refills | Status: DC

## 2023-09-20 ENCOUNTER — Inpatient Hospital Stay: Payer: 59 | Admitting: Medical Oncology

## 2023-09-20 ENCOUNTER — Inpatient Hospital Stay: Payer: 59 | Attending: Hematology & Oncology

## 2023-10-02 NOTE — Progress Notes (Signed)
 [facility] [address] [place], [place] [address] [phone]  Date:  10/06/2023   Name:  Collin Young   DOB:  [birth year]   MRN:  [id number]  PCP:  Watt Harlene BROCKS, MD    Chief Complaint: Cough (Pt states he had been coughing up blood, having sweats, and nausea. He had a CXR yesterday 10/05/23. Rx was not ready yesterday, so he will pick them up today. /Concerns/ questions: legs burning in both legs,)   History of Present Illness:  Collin Young is a 73 y.o. very pleasant male patient who presents with the following:  Patient is seen today with concern of not feeling well-history of chronic combined systolic and diastolic CHF/ ischemic CM, severe multivessel CAD treated medically, 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    Most recent visit with myself was in December for routine follow-up  He was seen by oncology yesterday and dx with atypical pneumonia-see chest x-ray below.  Was prescribed azithromycin  and doxycycline  but did not have a chance to start them just yet - he will pick up from [facility] today Today he notes he feels about the same- not worse He is eating ok No vomiting He has not noted a fever   Pt notes his legs were burning last week but this seemed to stop He feels like his breathing is normal  He is UTD on flu vaccine  Pulse Readings from Last 3 Encounters:  10/06/23 100  10/05/23 95  08/18/23 (!) 109     DG Chest 2 View Result Date: 10/05/2023 CLINICAL DATA:  Cough, hemoptysis EXAM: CHEST - 2 VIEW COMPARISON:  09/02/2022 FINDINGS: Heart and mediastinal contours within normal limits. Peribronchial thickening and interstitial prominence, increasing since prior study. There are small bilateral pleural effusions with bibasilar airspace opacities. No acute bony abnormality. IMPRESSION: Interstitial prominence  throughout the lungs could reflect edema or atypical infection. Small bilateral effusions with bibasilar atelectasis or infiltrates. Electronically Signed   By: Franky Crease M.D.   On: 10/05/2023 12:34     Patient Active Problem List   Diagnosis Date Noted   Iron  deficiency anemia due to chronic blood loss 11/14/2021   Acute on chronic systolic CHF (congestive heart failure) (HCC) 07/07/2021   COPD (chronic obstructive pulmonary disease) (HCC)    Hyponatremia    SOB (shortness of breath)    DNR (do not resuscitate)    Weakness generalized    Elevated LFTs    Pulmonary embolism (HCC) 06/11/2021   Acute on chronic heart failure (HCC) 06/02/2021   GERD without esophagitis 05/08/2021   Mixed hyperlipidemia 05/08/2021   Hypertensive urgency 05/08/2021   Elevated troponin level not due myocardial infarction 05/08/2021   Pure hypercholesterolemia 09/23/2020   Claudication in peripheral vascular disease (HCC) 01/25/2020   Late latent syphilis 01/15/2020   Coronary artery disease involving native coronary artery of native heart 07/05/2019   Chronic kidney disease, stage 3a (HCC) 07/05/2019   Combined systolic and diastolic heart failure (HCC) 09/26/2018   Malignant neoplasm of prostate (HCC) 06/17/2018   Medication management    Dysphagia    Palliative care by specialist    Ischemic cardiomyopathy    Congestive dilated cardiomyopathy (HCC)    Acute on chronic systolic congestive heart failure (HCC) 01/02/2017   Malignant neoplasm of pyriform sinus (HCC) 09/28/2013   Piriform sinus tumor  09/24/2013   NONSPECIFIC ABN FINDING RAD & OTH EXAM GI TRACT 05/14/2009    Past Medical History:  Diagnosis Date   Chronic combined systolic and diastolic CHF (congestive heart failure) (HCC)    followed by dr t. raford   COPD (chronic obstructive pulmonary disease) (HCC)    Coronary artery disease cardiologist-- dr annabella raford   per cardiac cath 01-05-2017  chronic total occlusion pLCx with  collaterals, 99% severe calcified prox. to mid RCA, otherwise mild to moderate CAD (medically managed)   Esophageal cancer, stage IIIB [facility]) oncologist-  dr ennever/  dr moody   dx 2010  SCC Stage IIIB completed chemoradiation;  localized recurrent left piriform sinus 01/ 2015,  completed concurrent chemoradiatoin 04/ 2015   GERD (gastroesophageal reflux disease)    09-07-2018   no issues since Gtube removed 06/ 2019   History of alcohol  abuse    quit 2001   History of cancer chemotherapy    2010;   2015   History of radiation therapy    10-19-2013 to  12-05-2013 pyriform sinus 69.96 Gy/3fx;   Radiation completed 2010 for esophageal cancer   History of seizure 2001   alcohol  withdrawal   HOH (hard of hearing)    Hyperlipidemia    Hypertension    Iron  deficiency anemia due to chronic blood loss 11/14/2021   Ischemic cardiomyopathy 12/2016   01-03-2017  echo,  ef 10-15%/   echo 05-06-2017 EF improved to 45-50%   Prostate cancer [facility]) urologist-  dr ottelin/  oncologist-- dr patrcia   dx 05-27-2018--- Stage T1c,  Gleason 4+3,  PSA 9.3--  scheduled for brachytherapy 09-23-2017   Renal cyst, left     Past Surgical History:  Procedure Laterality Date   BIOPSY  12/18/2021   Procedure: BIOPSY;  Surgeon: Teressa Toribio SQUIBB, MD;  Location: [facility];  Service: Gastroenterology;;   CARDIOVASCULAR STRESS TEST  10/09/09   normal nuclearr stress test, EF 57% Rendell First)   CENTRAL LINE INSERTION  06/16/2021   Procedure: CENTRAL LINE INSERTION;  Surgeon: Cherrie Toribio SAUNDERS, MD;  Location: [facility];  Service: Cardiovascular;;   ESOPHAGOGASTRODUODENOSCOPY (EGD) WITH PROPOFOL  N/A 12/18/2021   Procedure: ESOPHAGOGASTRODUODENOSCOPY (EGD) WITH PROPOFOL ;  Surgeon: Teressa Toribio SQUIBB, MD;  Location: [facility];  Service: Gastroenterology;  Laterality: N/A;   IR GASTROSTOMY TUBE MOD SED  01/13/2017   IR GASTROSTOMY TUBE REMOVAL  02/03/2018   IR PATIENT EVAL TECH 0-60 MINS  03/25/2017   IR REMOVAL TUN  ACCESS W/ PORT W/O FL MOD SED  01/15/2017   IR REPLACE G-TUBE SIMPLE WO FLUORO  01/13/2018   LARYNGOSCOPY N/A 09/15/2013   Procedure: LARYNGOSCOPY;  Surgeon: Vaughan Ricker, MD;  Location: [facility];  Service: ENT;  Laterality: N/A;  direct laryngoscopy with biopsy and esophagoscopy   RADIOACTIVE SEED IMPLANT N/A 09/23/2018   Procedure: RADIOACTIVE SEED IMPLANT/BRACHYTHERAPY IMPLANT;  Surgeon: Ottelin, Mark, MD;  Location: [facility];  Service: Urology;  Laterality: N/A;   RIGHT HEART CATH N/A 06/16/2021   Procedure: RIGHT HEART CATH;  Surgeon: Cherrie Toribio SAUNDERS, MD;  Location: [facility];  Service: Cardiovascular;  Laterality: N/A;   RIGHT/LEFT HEART CATH AND CORONARY ANGIOGRAPHY N/A 01/05/2017   Procedure: Right/Left Heart Cath and Coronary Angiography;  Surgeon: [name] Lonni BIRCH, MD;  Location: [facility] INVASIVE CV LAB;  Service: Cardiovascular;  Laterality: N/A;   TRANSTHORACIC ECHOCARDIOGRAM  05/06/2017   ef 45-50%,  grade 1 diastolic dysfunction/  AV severe calcified non coronary cusp with  moderate regurg. , no stenosis (valve area per echo 01-05-2017 1.08cm^2)/  mild MR, TR, and PR/  mild LAE    Social History   Tobacco Use   Smoking status: Former    Current packs/day: 0.00    Average packs/day: 1 pack/day for 48.8 years (48.8 ttl pk-yrs)    Types: Cigarettes    Start date: 11/08/1960    Quit date: 08/31/2009    Years since quitting: 14.1   Smokeless tobacco: Former    Types: Chew    Quit date: 09/01/1999  Vaping Use   Vaping status: Never Used  Substance Use Topics   Alcohol  use: Not Currently    Alcohol /week: 0.0 standard drinks of alcohol     Comment: quit in 2001   Drug use: No    Comment: back in the day used cocaine,alcohol , marijuana    Family History  Problem Relation Age of Onset   Breast cancer Mother    Prostate cancer Neg Hx    Colon cancer Neg Hx    Pancreatic cancer Neg Hx     No Known Allergies  Medication list has been reviewed and  updated.  Current Outpatient Medications on File Prior to Visit  Medication Sig Dispense Refill   albuterol  (VENTOLIN  HFA) 108 (90 Base) MCG/ACT inhaler Inhale 2 puffs into the lungs every 6 (six) hours as needed for wheezing or shortness of breath. 18 g 5   azithromycin  (ZITHROMAX  Z-PAK) 250 MG tablet Take 2 tablets together with food on day 1. Then take one tablet daily for days 2-5. 6 tablet 0   BAYER LOW DOSE 81 MG EC tablet Take 81 mg by mouth daily. Swallow whole.     CORLANOR  5 MG TABS tablet TAKE 1/2 TABLET(2.5 MG) BY MOUTH TWICE DAILY WITH A MEAL 45 tablet 3   dapagliflozin  propanediol (FARXIGA ) 10 MG TABS tablet Take 1 tablet (10 mg total) by mouth daily. 90 tablet 3   doxycycline  (VIBRA -TABS) 100 MG tablet Take 1 tablet (100 mg total) by mouth 2 (two) times daily for 7 days. 14 tablet 0   ELIQUIS  5 MG TABS tablet TAKE 1 TABLET(5 MG) BY MOUTH TWICE DAILY 60 tablet 5   Evolocumab  (REPATHA  SURECLICK) 140 MG/ML SOAJ Inject 140 mg into the skin every 14 (fourteen) days. Please keep your upcoming appointment for refills. 6 mL 3   fentaNYL  (DURAGESIC ) 12 MCG/HR Place 1 patch onto the skin every 3 (three) days. 10 patch 0   furosemide  (LASIX ) 40 MG tablet Take 1 tablet (40 mg total) by mouth as needed. 30 tablet 3   ipratropium (ATROVENT ) 0.03 % nasal spray Place 2 sprays into both nostrils every 12 (twelve) hours. 30 mL 12   polyethylene glycol (MIRALAX  / GLYCOLAX ) 17 g packet Take 17 g by mouth daily as needed for mild constipation. 14 each 0   pravastatin  (PRAVACHOL ) 80 MG tablet TAKE 1 TABLET(80 MG) BY MOUTH EVERY EVENING 90 tablet 3   sacubitril -valsartan  (ENTRESTO ) 24-26 MG Take 1 tablet by mouth 2 (two) times daily. 60 tablet 0   spironolactone  (ALDACTONE ) 25 MG tablet Take 1 tablet (25 mg total) by mouth as needed. 90 tablet 3   umeclidinium bromide  (INCRUSE ELLIPTA ) 62.5 MCG/ACT AEPB Inhale 1 puff into the lungs daily. 30 each 6   Current Facility-Administered Medications on File  Prior to Visit  Medication Dose Route Frequency Provider Last Rate Last Admin   topical emolient (BIAFINE) emulsion   Topical Daily Dewey Rush, MD   Given at 01/19/14  9087    Review of Systems:  As per HPI- otherwise negative.   Physical Examination: Vitals:   10/06/23 1413  BP: 134/82  Pulse: 100  Resp: 18  Temp: 97.9 F (36.6 C)  SpO2: 100%   Vitals:   10/06/23 1413  Weight: 142 lb 6.4 oz (64.6 kg)  Height: 5' 6 (1.676 m)   Body mass index is 22.98 kg/m. Ideal Body Weight: Weight in (lb) to have BMI = 25: 154.6  GEN: no acute distress.  Appears his normal self, hoarse voice secondary to cancer surgery HEENT: Atraumatic, Normocephalic.  Ears and Nose: No external deformity. CV: RRR, No M/G/R. No JVD. No thrill. No extra heart sounds.  PULM: no wheezes, crackles-  He has some rhonchi bilaterallyi. No retractions. No resp. distress. No accessory muscle use. ABD: S, NT, ND, +BS. No rebound. No HSM. EXTR: No c/c/e PSYCH: Normally interactive. Conversant.    Assessment and Plan: Erectile dysfunction, unspecified erectile dysfunction type - Plan: tadalafil  (CIALIS ) 20 MG tablet  Community acquired pneumonia, unspecified laterality Patient seen today-he was seen yesterday and diagnosed with community-acquired pneumonia.  He plans to start his antibiotics today.  He states he is feeling fine in this regard.  His main concern today is actually a prescription for Cialis  20.  He has used tadalafil  in the past with out any adverse effects, he would like to try tadalafil  instead which is fine.  He is not on nitroglycerin   Signed Harlene Schroeder, MD

## 2023-10-04 ENCOUNTER — Telehealth: Payer: Self-pay

## 2023-10-04 NOTE — Telephone Encounter (Signed)
Pt has appt scheduled 10/06/23

## 2023-10-04 NOTE — Telephone Encounter (Signed)
Initial Comment Caller states father had an appt tomorrow and he is coughing up phlegm with blood. States he is not having shortness of breath. Translation No Disp. Time Lamount Cohen Time) Disposition Final User 10/04/2023 9:00:19 AM Attempt made - message left Mayford Knife, RN, Lupe Carney 10/04/2023 9:27:17 AM FINAL ATTEMPT MADE - message left Yes Mayford Knife, RN, Lupe Carney Final Disposition 10/04/2023 9:27:17 AM FINAL ATTEMPT MADE - message left Yes Turner, RN, Lupe Carney

## 2023-10-05 ENCOUNTER — Encounter: Payer: Self-pay | Admitting: Medical Oncology

## 2023-10-05 ENCOUNTER — Inpatient Hospital Stay: Payer: 59 | Attending: Hematology & Oncology

## 2023-10-05 ENCOUNTER — Telehealth: Payer: Self-pay | Admitting: Hematology & Oncology

## 2023-10-05 ENCOUNTER — Other Ambulatory Visit: Payer: Self-pay | Admitting: *Deleted

## 2023-10-05 ENCOUNTER — Other Ambulatory Visit: Payer: Self-pay | Admitting: Medical Oncology

## 2023-10-05 ENCOUNTER — Ambulatory Visit (HOSPITAL_BASED_OUTPATIENT_CLINIC_OR_DEPARTMENT_OTHER)
Admission: RE | Admit: 2023-10-05 | Discharge: 2023-10-05 | Disposition: A | Discharge status: Home / Self Care | Payer: 59 | Source: Ambulatory Visit | Attending: Medical Oncology | Admitting: Medical Oncology

## 2023-10-05 ENCOUNTER — Inpatient Hospital Stay (HOSPITAL_BASED_OUTPATIENT_CLINIC_OR_DEPARTMENT_OTHER): Payer: 59 | Admitting: Medical Oncology

## 2023-10-05 VITALS — BP 132/64 | HR 95 | Temp 97.5°F | Resp 19 | Ht 66.0 in | Wt 140.1 lb

## 2023-10-05 DIAGNOSIS — D509 Iron deficiency anemia, unspecified: Secondary | ICD-10-CM | POA: Insufficient documentation

## 2023-10-05 DIAGNOSIS — Z79620 Long term (current) use of immunosuppressive biologic: Secondary | ICD-10-CM | POA: Insufficient documentation

## 2023-10-05 DIAGNOSIS — E039 Hypothyroidism, unspecified: Secondary | ICD-10-CM

## 2023-10-05 DIAGNOSIS — D500 Iron deficiency anemia secondary to blood loss (chronic): Secondary | ICD-10-CM

## 2023-10-05 DIAGNOSIS — C12 Malignant neoplasm of pyriform sinus: Secondary | ICD-10-CM

## 2023-10-05 DIAGNOSIS — C61 Malignant neoplasm of prostate: Secondary | ICD-10-CM

## 2023-10-05 DIAGNOSIS — R042 Hemoptysis: Secondary | ICD-10-CM | POA: Insufficient documentation

## 2023-10-05 DIAGNOSIS — Z79899 Other long term (current) drug therapy: Secondary | ICD-10-CM | POA: Diagnosis not present

## 2023-10-05 DIAGNOSIS — J189 Pneumonia, unspecified organism: Secondary | ICD-10-CM

## 2023-10-05 DIAGNOSIS — Z86711 Personal history of pulmonary embolism: Secondary | ICD-10-CM

## 2023-10-05 LAB — CBC WITH DIFFERENTIAL (CANCER CENTER ONLY)
Abs Immature Granulocytes: 0.05 10*3/uL (ref 0.00–0.07)
Basophils Absolute: 0 10*3/uL (ref 0.0–0.1)
Basophils Relative: 0 %
Eosinophils Absolute: 0.1 10*3/uL (ref 0.0–0.5)
Eosinophils Relative: 1 %
HCT: 33.8 % — ABNORMAL LOW (ref 39.0–52.0)
Hemoglobin: 9.7 g/dL — ABNORMAL LOW (ref 13.0–17.0)
Immature Granulocytes: 1 %
Lymphocytes Relative: 13 %
Lymphs Abs: 1 10*3/uL (ref 0.7–4.0)
MCH: 21.8 pg — ABNORMAL LOW (ref 26.0–34.0)
MCHC: 28.7 g/dL — ABNORMAL LOW (ref 30.0–36.0)
MCV: 76 fL — ABNORMAL LOW (ref 80.0–100.0)
Monocytes Absolute: 1.3 10*3/uL — ABNORMAL HIGH (ref 0.1–1.0)
Monocytes Relative: 17 %
Neutro Abs: 5.1 10*3/uL (ref 1.7–7.7)
Neutrophils Relative %: 68 %
Platelet Count: 475 10*3/uL — ABNORMAL HIGH (ref 150–400)
RBC: 4.45 MIL/uL (ref 4.22–5.81)
RDW: 17.3 % — ABNORMAL HIGH (ref 11.5–15.5)
WBC Count: 7.6 10*3/uL (ref 4.0–10.5)
nRBC: 0 % (ref 0.0–0.2)

## 2023-10-05 LAB — CMP (CANCER CENTER ONLY)
ALT: 9 U/L (ref 0–44)
AST: 12 U/L — ABNORMAL LOW (ref 15–41)
Albumin: 3.9 g/dL (ref 3.5–5.0)
Alkaline Phosphatase: 45 U/L (ref 38–126)
Anion gap: 10 (ref 5–15)
BUN: 22 mg/dL (ref 8–23)
CO2: 31 mmol/L (ref 22–32)
Calcium: 9.1 mg/dL (ref 8.9–10.3)
Chloride: 94 mmol/L — ABNORMAL LOW (ref 98–111)
Creatinine: 1.12 mg/dL (ref 0.61–1.24)
GFR, Estimated: >60 mL/min (ref >60)
Glucose, Bld: 114 mg/dL — ABNORMAL HIGH (ref 70–99)
Potassium: 4 mmol/L (ref 3.5–5.1)
Sodium: 135 mmol/L (ref 135–145)
Total Bilirubin: 0.2 mg/dL (ref 0.0–1.2)
Total Protein: 7.7 g/dL (ref 6.5–8.1)

## 2023-10-05 LAB — IRON AND IRON BINDING CAPACITY (CC-WL,HP ONLY)
Iron: 40 ug/dL — ABNORMAL LOW (ref 45–182)
Saturation Ratios: 11 % — ABNORMAL LOW (ref 17.9–39.5)
TIBC: 360 ug/dL (ref 250–450)
UIBC: 320 ug/dL (ref 117–376)

## 2023-10-05 LAB — TSH: TSH: 2.723 u[IU]/mL (ref 0.350–4.500)

## 2023-10-05 LAB — T4, FREE: Free T4: 0.83 ng/dL (ref 0.61–1.12)

## 2023-10-05 LAB — FERRITIN: Ferritin: 31 ng/mL (ref 24–336)

## 2023-10-05 LAB — D-DIMER, QUANTITATIVE: D-Dimer, Quant: 1.4 ug{FEU}/mL — ABNORMAL HIGH (ref 0.00–0.50)

## 2023-10-05 MED ORDER — DOXYCYCLINE HYCLATE 100 MG PO TABS: 100.0000 mg | ORAL_TABLET | Freq: Two times a day (BID) | ORAL | 0 refills | Status: AC

## 2023-10-05 MED ORDER — AZITHROMYCIN 250 MG PO TABS: ORAL_TABLET | ORAL | 0 refills | Status: DC

## 2023-10-05 MED ORDER — FENTANYL 12 MCG/HR TD PT72: 1.0000 | MEDICATED_PATCH | TRANSDERMAL | 0 refills | Status: DC

## 2023-10-05 NOTE — Telephone Encounter (Signed)
Called patient and left a voicemail for patient to call back to schedule iron infusions and follow up appointment.

## 2023-10-05 NOTE — Progress Notes (Signed)
 Hematology and Oncology Follow Up Visit  NILAY MANGRUM [id number] 25-Jun-1951 73 y.o. 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  Yurkovich is back for followup.    He states that he is doing really well except for a cough that he has had for about 2 weeks. Occurred with cold symptoms including a runny nose and general malaise. Cough described as deep with occasional hemoptysis. No significant SOB. No fevers. He has taken OTC Coricidin for symptoms with mild relief.   He does have history of prostate cancer.  PSA has remained normal. This is being rechecked today.   He is on Eliquis .  He is doing well with the Eliquis . No bleeding reported other than the few small amounts of scant hemoptysis.   He is on a Fentanyl  patch and pain is well-controlled.   He reports a good appetite. Weight is stable. He is having no problems with nausea or vomiting.  He has had no obvious change in bowel or bladder habits.    Overall, I would say his performance status is probably ECOG 1.    Wt Readings from Last 3 Encounters:  10/05/23 140 lb 1.9 oz (63.6 kg)  08/18/23 128 lb (58.1 kg)  08/09/23 135 lb (61.2 kg)     Medications:  Current Outpatient Medications:    albuterol  (VENTOLIN  HFA) 108 (90 Base) MCG/ACT inhaler, Inhale 2 puffs into the lungs every 6 (six) hours as needed for wheezing or shortness of breath., Disp: 18 g, Rfl: 5   BAYER LOW DOSE 81 MG EC tablet, Take 81 mg by mouth daily. Swallow whole., Disp: , Rfl:    CORLANOR  5 MG TABS tablet, TAKE 1/2 TABLET(2.5 MG) BY MOUTH TWICE DAILY WITH  A MEAL, Disp: 45 tablet, Rfl: 3   dapagliflozin  propanediol (FARXIGA ) 10 MG TABS tablet, Take 1 tablet (10 mg total) by mouth daily., Disp: 90 tablet, Rfl: 3   ELIQUIS  5 MG TABS tablet, TAKE 1 TABLET(5 MG) BY MOUTH TWICE DAILY, Disp: 60 tablet, Rfl: 5   Evolocumab  (REPATHA  SURECLICK) 140 MG/ML SOAJ, Inject 140 mg into the skin every 14 (fourteen) days. Please keep your upcoming appointment for refills., Disp: 6 mL, Rfl: 3   fentaNYL  (DURAGESIC ) 12 MCG/HR, Place 1 patch onto the skin every 3 (three) days., Disp: 10 patch, Rfl: 0   ipratropium (ATROVENT ) 0.03 % nasal spray, Place 2 sprays into both nostrils every 12 (twelve) hours., Disp: 30 mL, Rfl: 12   polyethylene glycol (MIRALAX  / GLYCOLAX ) 17 g packet, Take 17 g by mouth daily as needed for mild constipation., Disp: 14 each, Rfl: 0   pravastatin  (PRAVACHOL ) 80 MG tablet, TAKE 1 TABLET(80 MG) BY MOUTH EVERY EVENING, Disp: 90 tablet, Rfl: 3   sacubitril -valsartan  (ENTRESTO ) 24-26 MG, Take 1 tablet by mouth 2 (two) times daily., Disp: 60 tablet, Rfl: 0   spironolactone  (ALDACTONE ) 25 MG tablet, Take 1 tablet (25 mg total) by mouth as needed., Disp: 90 tablet, Rfl: 3   umeclidinium bromide  (INCRUSE ELLIPTA ) 62.5 MCG/ACT AEPB, Inhale 1 puff into the lungs daily., Disp: 30 each, Rfl: 6   furosemide  (  LASIX ) 40 MG tablet, Take 1 tablet (40 mg total) by mouth as needed., Disp: 30 tablet, Rfl: 3 No current facility-administered medications for this visit.  Facility-Administered Medications Ordered in Other Visits:    topical emolient (BIAFINE) emulsion, , Topical, Daily, Dewey Rush, MD, Given at 01/19/14 0912  Allergies: No Known Allergies  Past Medical History, Surgical history, Social history, and Family History were reviewed and updated.  Review of Systems: Review of Systems  Constitutional: Negative.   HENT: Negative.    Eyes: Negative.   Respiratory: Negative.    Cardiovascular: Negative.   Gastrointestinal: Negative.   Genitourinary:  Negative.   Musculoskeletal: Negative.   Skin: Negative.   Neurological: Negative.   Endo/Heme/Allergies: Negative.   Psychiatric/Behavioral: Negative.     Physical Exam:  height is 5' 6 (1.676 m) and weight is 140 lb 1.9 oz (63.6 kg). His oral temperature is 97.5 F (36.4 C) (abnormal). His blood pressure is 132/64 and his pulse is 95. His respiration is 19 and oxygen saturation is 100%.   Physical Exam Vitals reviewed.  HENT:     Head: Normocephalic and atraumatic.  Eyes:     Pupils: Pupils are equal, round, and reactive to light.  Cardiovascular:     Rate and Rhythm: Normal rate and regular rhythm.     Heart sounds: Normal heart sounds.  Pulmonary:     Effort: Pulmonary effort is normal. No respiratory distress.     Breath sounds: No stridor. Rhonchi (throughout) present. No wheezing or rales.  Abdominal:     General: Bowel sounds are normal.     Palpations: Abdomen is soft.  Musculoskeletal:        General: No tenderness or deformity. Normal range of motion.     Cervical back: Normal range of motion.     Right lower leg: No edema.     Left lower leg: No edema.  Lymphadenopathy:     Cervical: No cervical adenopathy.  Skin:    General: Skin is warm and dry.     Findings: No erythema or rash.  Neurological:     Mental Status: He is alert and oriented to person, place, and time.  Psychiatric:        Behavior: Behavior normal.        Thought Content: Thought content normal.        Judgment: Judgment normal.     Lab Results  Component Value Date   WBC 7.6 10/05/2023   HGB 9.7 (L) 10/05/2023   HCT 33.8 (L) 10/05/2023   MCV 76.0 (L) 10/05/2023   PLT 475 (H) 10/05/2023     Chemistry      Component Value Date/Time   NA 135 10/05/2023 1056   NA 135 05/29/2020 1556   NA 139 07/30/2017 0926   NA 137 03/10/2017 1000   K 4.0 10/05/2023 1056   K 3.9 07/30/2017 0926   K 4.3 03/10/2017 1000   CL 94 (L) 10/05/2023 1056   CL 96 (L) 07/30/2017 0926   CO2 31 10/05/2023  1056   CO2 33 07/30/2017 0926   CO2 29 03/10/2017 1000   BUN 22 10/05/2023 1056   BUN 23 05/29/2020 1556   BUN 17 07/30/2017 0926   BUN 14.7 03/10/2017 1000   CREATININE 1.12 10/05/2023 1056   CREATININE 1.4 (H) 07/30/2017 0926   CREATININE 1.1 03/10/2017 1000      Component Value Date/Time   CALCIUM  9.1 10/05/2023 1056   CALCIUM  9.6 07/30/2017 0926   CALCIUM   10.0 03/10/2017 1000   ALKPHOS 45 10/05/2023 1056   ALKPHOS 49 07/30/2017 0926   ALKPHOS 51 03/10/2017 1000   AST 12 (L) 10/05/2023 1056   AST 23 03/10/2017 1000   ALT 9 10/05/2023 1056   ALT 31 07/30/2017 0926   ALT 18 03/10/2017 1000   BILITOT 0.2 10/05/2023 1056   BILITOT 0.41 03/10/2017 1000     Encounter Diagnosis  Name Primary?   Cough with hemoptysis Yes   Impression and Plan: Mr. Schloemer is 73 year old gentleman. He had a localized recurrence of carcinoma of the left pyriform sinus. He underwent concurrent chemotherapy and radiation therapy. He finished his treatment back in April of 2015.   Clinically, he continues to do well. He will continue on observation.   He also has a history of prostate cancer. PSA pending today.   Hgb is down but I suspect some of this is due to his recent illness and hemoptysis. Iron  suspected to be low given his MCV level. We will supplement with IV iron  if needed.   Chest x ray today to assess for pneumonia vs mass vs other. Will treat accordingly.  - UPDATE: Chest x ray shows atypical pneumonia vs edema. Given history of recent illness likely atypical pneumonia. I will treat with doxycyline and azithromycin . Should symptoms worsen or he have any SOB, chest pain, fever he will go to ER for additional work up. Of note his D-dimer was elevated however this can occur in the setting of pneumonia. This was obtained prior to finding out about his hemoptysis/cough. As he is on Eliquis  and given his history of recent illness suggestive of pneumonia I am less concerned about a PE but we did  discuss this including red flags.   Venofer  300 mg weekly x 3 RTC 2 months MD, labs (CBC w/, iron , ferritin, retic, CMP, B12, folate)  Lauraine HERO [facility] 2/4/202511:44 AM

## 2023-10-06 ENCOUNTER — Ambulatory Visit (INDEPENDENT_AMBULATORY_CARE_PROVIDER_SITE_OTHER): Payer: 59 | Admitting: Family Medicine

## 2023-10-06 ENCOUNTER — Encounter: Payer: Self-pay | Admitting: Hematology & Oncology

## 2023-10-06 ENCOUNTER — Other Ambulatory Visit: Payer: Self-pay | Admitting: Medical Oncology

## 2023-10-06 VITALS — BP 134/82 | HR 100 | Temp 97.9°F | Resp 18 | Ht 66.0 in | Wt 142.4 lb

## 2023-10-06 DIAGNOSIS — N529 Male erectile dysfunction, unspecified: Secondary | ICD-10-CM

## 2023-10-06 DIAGNOSIS — J189 Pneumonia, unspecified organism: Secondary | ICD-10-CM | POA: Diagnosis not present

## 2023-10-06 LAB — PSA, TOTAL AND FREE
PSA, Free Pct: 3.2 %
PSA, Free: 0.1 ng/mL
Prostate Specific Ag, Serum: 3.1 ng/mL (ref 0.0–4.0)

## 2023-10-06 MED ORDER — TADALAFIL 20 MG PO TABS: 10.0000 mg | ORAL_TABLET | ORAL | 11 refills | Status: DC | PRN

## 2023-10-06 NOTE — Patient Instructions (Addendum)
 Please get started on your antibiotics asap- let me know if you are not seeing improvement  Assuming all is well please see me in about 6 months for recheck I also gave you some Cialis  20 mg to try

## 2023-10-14 ENCOUNTER — Inpatient Hospital Stay: Payer: 59

## 2023-10-14 VITALS — BP 124/80 | HR 101 | Temp 97.9°F | Resp 18

## 2023-10-14 DIAGNOSIS — D500 Iron deficiency anemia secondary to blood loss (chronic): Secondary | ICD-10-CM

## 2023-10-14 DIAGNOSIS — D509 Iron deficiency anemia, unspecified: Secondary | ICD-10-CM | POA: Diagnosis not present

## 2023-10-14 MED ORDER — SODIUM CHLORIDE 0.9 % IV SOLN
INTRAVENOUS | Status: DC
Start: 2023-10-14 — End: 2023-10-14

## 2023-10-14 MED ORDER — SODIUM CHLORIDE 0.9 % IV SOLN
300.0000 mg | Freq: Once | INTRAVENOUS | Status: AC
  Administered 2023-10-14: 300 mg via INTRAVENOUS
  Filled 2023-10-14: qty 300

## 2023-10-14 NOTE — Patient Instructions (Signed)

## 2023-10-19 ENCOUNTER — Telehealth: Payer: Self-pay

## 2023-10-19 ENCOUNTER — Encounter: Payer: Self-pay | Admitting: Vascular Surgery

## 2023-10-19 NOTE — Telephone Encounter (Signed)
Returned [employer], with a call as he requested. Left VM for him to return our call if he still needs assistance.

## 2023-10-21 ENCOUNTER — Inpatient Hospital Stay: Payer: 59

## 2023-10-21 VITALS — BP 116/56 | HR 88 | Temp 98.0°F | Resp 18

## 2023-10-21 DIAGNOSIS — D500 Iron deficiency anemia secondary to blood loss (chronic): Secondary | ICD-10-CM

## 2023-10-21 DIAGNOSIS — D509 Iron deficiency anemia, unspecified: Secondary | ICD-10-CM | POA: Diagnosis not present

## 2023-10-21 MED ORDER — SODIUM CHLORIDE 0.9 % IV SOLN: INTRAVENOUS | Status: DC

## 2023-10-21 MED ORDER — SODIUM CHLORIDE 0.9 % IV SOLN
300.0000 mg | Freq: Once | INTRAVENOUS | Status: AC
  Administered 2023-10-21: 300 mg via INTRAVENOUS
  Filled 2023-10-21: qty 300

## 2023-10-21 NOTE — Patient Instructions (Signed)

## 2023-10-25 ENCOUNTER — Encounter (HOSPITAL_BASED_OUTPATIENT_CLINIC_OR_DEPARTMENT_OTHER): Payer: Self-pay

## 2023-10-25 ENCOUNTER — Other Ambulatory Visit: Payer: Self-pay

## 2023-10-25 ENCOUNTER — Emergency Department (HOSPITAL_BASED_OUTPATIENT_CLINIC_OR_DEPARTMENT_OTHER): Payer: 59

## 2023-10-25 ENCOUNTER — Inpatient Hospital Stay (HOSPITAL_BASED_OUTPATIENT_CLINIC_OR_DEPARTMENT_OTHER)
Admission: EM | Admit: 2023-10-25 | Discharge: 2023-10-29 | DRG: 291 | Disposition: A | Discharge status: Home / Self Care | Payer: 59 | Attending: Internal Medicine | Admitting: Internal Medicine

## 2023-10-25 DIAGNOSIS — R64 Cachexia: Secondary | ICD-10-CM | POA: Diagnosis present

## 2023-10-25 DIAGNOSIS — I130 Hypertensive heart and chronic kidney disease with heart failure and stage 1 through stage 4 chronic kidney disease, or unspecified chronic kidney disease: Principal | ICD-10-CM | POA: Diagnosis present

## 2023-10-25 DIAGNOSIS — F1011 Alcohol abuse, in remission: Secondary | ICD-10-CM | POA: Diagnosis present

## 2023-10-25 DIAGNOSIS — C61 Malignant neoplasm of prostate: Secondary | ICD-10-CM | POA: Diagnosis present

## 2023-10-25 DIAGNOSIS — Z8522 Personal history of malignant neoplasm of nasal cavities, middle ear, and accessory sinuses: Secondary | ICD-10-CM

## 2023-10-25 DIAGNOSIS — E875 Hyperkalemia: Secondary | ICD-10-CM | POA: Diagnosis present

## 2023-10-25 DIAGNOSIS — E871 Hypo-osmolality and hyponatremia: Secondary | ICD-10-CM | POA: Diagnosis present

## 2023-10-25 DIAGNOSIS — Z681 Body mass index (BMI) 19 or less, adult: Secondary | ICD-10-CM

## 2023-10-25 DIAGNOSIS — R7989 Other specified abnormal findings of blood chemistry: Secondary | ICD-10-CM

## 2023-10-25 DIAGNOSIS — I5043 Acute on chronic combined systolic (congestive) and diastolic (congestive) heart failure: Secondary | ICD-10-CM | POA: Diagnosis present

## 2023-10-25 DIAGNOSIS — I2699 Other pulmonary embolism without acute cor pulmonale: Secondary | ICD-10-CM | POA: Diagnosis present

## 2023-10-25 DIAGNOSIS — Z923 Personal history of irradiation: Secondary | ICD-10-CM

## 2023-10-25 DIAGNOSIS — Z8546 Personal history of malignant neoplasm of prostate: Secondary | ICD-10-CM

## 2023-10-25 DIAGNOSIS — K219 Gastro-esophageal reflux disease without esophagitis: Secondary | ICD-10-CM | POA: Diagnosis present

## 2023-10-25 DIAGNOSIS — Z66 Do not resuscitate: Secondary | ICD-10-CM | POA: Diagnosis present

## 2023-10-25 DIAGNOSIS — Z79891 Long term (current) use of opiate analgesic: Secondary | ICD-10-CM

## 2023-10-25 DIAGNOSIS — Z79899 Other long term (current) drug therapy: Secondary | ICD-10-CM

## 2023-10-25 DIAGNOSIS — D500 Iron deficiency anemia secondary to blood loss (chronic): Secondary | ICD-10-CM | POA: Diagnosis present

## 2023-10-25 DIAGNOSIS — J9601 Acute respiratory failure with hypoxia: Secondary | ICD-10-CM | POA: Diagnosis present

## 2023-10-25 DIAGNOSIS — Z9221 Personal history of antineoplastic chemotherapy: Secondary | ICD-10-CM

## 2023-10-25 DIAGNOSIS — N179 Acute kidney failure, unspecified: Secondary | ICD-10-CM | POA: Diagnosis not present

## 2023-10-25 DIAGNOSIS — Z7951 Long term (current) use of inhaled steroids: Secondary | ICD-10-CM

## 2023-10-25 DIAGNOSIS — Z87891 Personal history of nicotine dependence: Secondary | ICD-10-CM

## 2023-10-25 DIAGNOSIS — I255 Ischemic cardiomyopathy: Secondary | ICD-10-CM | POA: Diagnosis present

## 2023-10-25 DIAGNOSIS — N1831 Chronic kidney disease, stage 3a: Secondary | ICD-10-CM | POA: Diagnosis present

## 2023-10-25 DIAGNOSIS — Z1152 Encounter for screening for COVID-19: Secondary | ICD-10-CM

## 2023-10-25 DIAGNOSIS — I509 Heart failure, unspecified: Principal | ICD-10-CM

## 2023-10-25 DIAGNOSIS — E782 Mixed hyperlipidemia: Secondary | ICD-10-CM | POA: Diagnosis present

## 2023-10-25 DIAGNOSIS — R1310 Dysphagia, unspecified: Secondary | ICD-10-CM | POA: Diagnosis present

## 2023-10-25 DIAGNOSIS — Z8501 Personal history of malignant neoplasm of esophagus: Secondary | ICD-10-CM

## 2023-10-25 DIAGNOSIS — Z86711 Personal history of pulmonary embolism: Secondary | ICD-10-CM

## 2023-10-25 DIAGNOSIS — Z7982 Long term (current) use of aspirin: Secondary | ICD-10-CM

## 2023-10-25 DIAGNOSIS — K761 Chronic passive congestion of liver: Secondary | ICD-10-CM | POA: Diagnosis present

## 2023-10-25 DIAGNOSIS — A528 Late syphilis, latent: Secondary | ICD-10-CM | POA: Diagnosis present

## 2023-10-25 DIAGNOSIS — R0600 Dyspnea, unspecified: Secondary | ICD-10-CM

## 2023-10-25 DIAGNOSIS — I5023 Acute on chronic systolic (congestive) heart failure: Secondary | ICD-10-CM | POA: Diagnosis present

## 2023-10-25 DIAGNOSIS — I2510 Atherosclerotic heart disease of native coronary artery without angina pectoris: Secondary | ICD-10-CM | POA: Diagnosis present

## 2023-10-25 DIAGNOSIS — I5082 Biventricular heart failure: Secondary | ICD-10-CM | POA: Diagnosis present

## 2023-10-25 DIAGNOSIS — Z7901 Long term (current) use of anticoagulants: Secondary | ICD-10-CM

## 2023-10-25 DIAGNOSIS — J449 Chronic obstructive pulmonary disease, unspecified: Secondary | ICD-10-CM | POA: Diagnosis present

## 2023-10-25 DIAGNOSIS — Z803 Family history of malignant neoplasm of breast: Secondary | ICD-10-CM

## 2023-10-25 LAB — CBC WITH DIFFERENTIAL/PLATELET
Abs Immature Granulocytes: 0.02 10*3/uL (ref 0.00–0.07)
Basophils Absolute: 0 10*3/uL (ref 0.0–0.1)
Basophils Relative: 0 %
Eosinophils Absolute: 0 10*3/uL (ref 0.0–0.5)
Eosinophils Relative: 0 %
HCT: 38.4 % — ABNORMAL LOW (ref 39.0–52.0)
Hemoglobin: 10.9 g/dL — ABNORMAL LOW (ref 13.0–17.0)
Immature Granulocytes: 0 %
Lymphocytes Relative: 20 %
Lymphs Abs: 1 10*3/uL (ref 0.7–4.0)
MCH: 22.2 pg — ABNORMAL LOW (ref 26.0–34.0)
MCHC: 28.4 g/dL — ABNORMAL LOW (ref 30.0–36.0)
MCV: 78.4 fL — ABNORMAL LOW (ref 80.0–100.0)
Monocytes Absolute: 1.1 10*3/uL — ABNORMAL HIGH (ref 0.1–1.0)
Monocytes Relative: 22 %
Neutro Abs: 2.9 10*3/uL (ref 1.7–7.7)
Neutrophils Relative %: 58 %
Platelets: 248 10*3/uL (ref 150–400)
RBC: 4.9 MIL/uL (ref 4.22–5.81)
RDW: 22.2 % — ABNORMAL HIGH (ref 11.5–15.5)
WBC: 5 10*3/uL (ref 4.0–10.5)
nRBC: 1.2 % — ABNORMAL HIGH (ref 0.0–0.2)

## 2023-10-25 LAB — RESP PANEL BY RT-PCR (RSV, FLU A&B, COVID)  RVPGX2
Influenza A by PCR: NEGATIVE
Influenza B by PCR: NEGATIVE
Resp Syncytial Virus by PCR: NEGATIVE
SARS Coronavirus 2 by RT PCR: NEGATIVE

## 2023-10-25 LAB — COMPREHENSIVE METABOLIC PANEL
ALT: 284 U/L — ABNORMAL HIGH (ref 0–44)
AST: 100 U/L — ABNORMAL HIGH (ref 15–41)
Albumin: 4.2 g/dL (ref 3.5–5.0)
Alkaline Phosphatase: 58 U/L (ref 38–126)
Anion gap: 9 (ref 5–15)
BUN: 25 mg/dL — ABNORMAL HIGH (ref 8–23)
CO2: 25 mmol/L (ref 22–32)
Calcium: 9.8 mg/dL (ref 8.9–10.3)
Chloride: 95 mmol/L — ABNORMAL LOW (ref 98–111)
Creatinine, Ser: 1.43 mg/dL — ABNORMAL HIGH (ref 0.61–1.24)
GFR, Estimated: 52 mL/min — ABNORMAL LOW (ref >60)
Glucose, Bld: 119 mg/dL — ABNORMAL HIGH (ref 70–99)
Potassium: 5.4 mmol/L — ABNORMAL HIGH (ref 3.5–5.1)
Sodium: 129 mmol/L — ABNORMAL LOW (ref 135–145)
Total Bilirubin: 0.4 mg/dL (ref 0.0–1.2)
Total Protein: 8.3 g/dL — ABNORMAL HIGH (ref 6.5–8.1)

## 2023-10-25 LAB — POTASSIUM: Potassium: 5.4 mmol/L — ABNORMAL HIGH (ref 3.5–5.1)

## 2023-10-25 LAB — BRAIN NATRIURETIC PEPTIDE: B Natriuretic Peptide: 2130.3 pg/mL — ABNORMAL HIGH (ref 0.0–100.0)

## 2023-10-25 LAB — TROPONIN I (HIGH SENSITIVITY)
Troponin I (High Sensitivity): 114 ng/L (ref <18)
Troponin I (High Sensitivity): 120 ng/L (ref <18)

## 2023-10-25 MED ORDER — IPRATROPIUM-ALBUTEROL 0.5-2.5 (3) MG/3ML IN SOLN
3.0000 mL | Freq: Once | RESPIRATORY_TRACT | Status: AC
  Administered 2023-10-25: 3 mL via RESPIRATORY_TRACT
  Filled 2023-10-25: qty 3

## 2023-10-25 MED ORDER — IOHEXOL 350 MG/ML SOLN
100.0000 mL | Freq: Once | INTRAVENOUS | Status: AC | PRN
  Administered 2023-10-25: 75 mL via INTRAVENOUS

## 2023-10-25 MED ORDER — FUROSEMIDE 10 MG/ML IJ SOLN
40.0000 mg | Freq: Once | INTRAMUSCULAR | Status: AC
  Administered 2023-10-25: 40 mg via INTRAVENOUS
  Filled 2023-10-25: qty 4

## 2023-10-25 NOTE — ED Provider Notes (Signed)
 Collin Young   CSN: [id number] Arrival date & time: 10/25/23  1926     History  Chief Complaint  Patient presents with   Shortness of Breath    Collin Young is a 73 y.o. male, history of [place] ED, prostate cancer, who presents to the ED secondary to shortness of breath that is been progressively getting worse, for the last 2 weeks.  He states he was diagnosed with the flu, and slight pneumonia about 4 weeks ago, finished his antibiotics 2 weeks ago.  Since then has progressively become more short of breath, but denies any swelling of his extremities.  Notes that he has a productive cough that is constant, but has not noticed much of an increase.  Denies any fevers, chills, sore throat, chest pain, abdominal pain, or leg pain    Home Medications Prior to Admission medications   Medication Sig Start Date End Date Taking? Authorizing Provider  albuterol (VENTOLIN HFA) 108 (90 Base) MCG/ACT inhaler Inhale 2 puffs into the lungs every 6 (six) hours as needed for wheezing or shortness of breath. 10/06/22   Copland, Gwenlyn Found, MD  azithromycin (ZITHROMAX Z-PAK) 250 MG tablet Take 2 tablets together with food on day 1. Then take one tablet daily for days 2-5. 10/05/23   Rushie Chestnut, PA-C  BAYER LOW DOSE 81 MG EC tablet Take 81 mg by mouth daily. Swallow whole.    [provider]  CORLANOR 5 MG TABS tablet TAKE 1/2 TABLET(2.5 MG) BY MOUTH TWICE DAILY WITH A MEAL 05/25/23   Milford, Anderson Malta, FNP  dapagliflozin propanediol (FARXIGA) 10 MG TABS tablet Take 1 tablet (10 mg total) by mouth daily. 04/05/23   Bensimhon, Bevelyn Buckles, MD  ELIQUIS 5 MG TABS tablet TAKE 1 TABLET(5 MG) BY MOUTH TWICE DAILY 09/07/23   Bensimhon, Bevelyn Buckles, MD  Evolocumab (REPATHA SURECLICK) 140 MG/ML SOAJ Inject 140 mg into the skin every 14 (fourteen) days. Please keep your upcoming appointment for refills. 02/01/23   Alver Sorrow, NP  fentaNYL (DURAGESIC) 12  MCG/HR Place 1 patch onto the skin every 3 (three) days. 10/05/23   Josph Macho, MD  furosemide (LASIX) 40 MG tablet Take 1 tablet (40 mg total) by mouth as needed. 12/31/21 05/15/23  Bensimhon, Bevelyn Buckles, MD  ipratropium (ATROVENT) 0.03 % nasal spray Place 2 sprays into both nostrils every 12 (twelve) hours. 07/03/21   Copland, Gwenlyn Found, MD  polyethylene glycol (MIRALAX / GLYCOLAX) 17 g packet Take 17 g by mouth daily as needed for mild constipation. 05/11/21   Alwyn Ren, MD  pravastatin (PRAVACHOL) 80 MG tablet TAKE 1 TABLET(80 MG) BY MOUTH EVERY EVENING 08/09/23   Copland, Gwenlyn Found, MD  sacubitril-valsartan (ENTRESTO) 24-26 MG Take 1 tablet by mouth 2 (two) times daily. 06/29/23   Bensimhon, Bevelyn Buckles, MD  spironolactone (ALDACTONE) 25 MG tablet Take 1 tablet (25 mg total) by mouth as needed. 01/18/23   Bensimhon, Bevelyn Buckles, MD  tadalafil (CIALIS) 20 MG tablet Take 0.5-1 tablets (10-20 mg total) by mouth every other day as needed for erectile dysfunction. 10/06/23   Copland, Gwenlyn Found, MD  umeclidinium bromide (INCRUSE ELLIPTA) 62.5 MCG/ACT AEPB Inhale 1 puff into the lungs daily. 09/18/22   Copland, Gwenlyn Found, MD      Allergies    Patient has no known allergies.    Review of Systems   Review of Systems  Respiratory:  Positive for shortness of breath.  Cardiovascular:  Negative for chest pain.    Physical Exam Updated Vital Signs BP (!) 156/86   Pulse (!) 107   Temp 98 F (36.7 C) (Axillary)   Resp (!) 25   SpO2 96%  Physical Exam Vitals and nursing Young reviewed.  Constitutional:      General: He is not in acute distress.    Appearance: He is well-developed.  HENT:     Head: Normocephalic and atraumatic.  Eyes:     Conjunctiva/sclera: Conjunctivae normal.  Cardiovascular:     Rate and Rhythm: Normal rate and regular rhythm.     Heart sounds: No murmur heard. Pulmonary:     Effort: Pulmonary effort is normal. Tachypnea present. No respiratory distress.     Breath  sounds: Examination of the left-upper field reveals rales. Examination of the right-lower field reveals rales. Rales present.  Abdominal:     Palpations: Abdomen is soft.     Tenderness: There is no abdominal tenderness.  Musculoskeletal:        General: No swelling.     Cervical back: Neck supple.  Skin:    General: Skin is warm and dry.     Capillary Refill: Capillary refill takes less than 2 seconds.  Neurological:     Mental Status: He is alert.  Psychiatric:        Mood and Affect: Mood normal.     ED Results / Procedures / Treatments   Labs (all labs ordered are listed, but only abnormal results are displayed) Labs Reviewed  CBC WITH DIFFERENTIAL/PLATELET - Abnormal; Notable for the following components:      Result Value   Hemoglobin 10.9 (*)    HCT 38.4 (*)    MCV 78.4 (*)    MCH 22.2 (*)    MCHC 28.4 (*)    RDW 22.2 (*)    nRBC 1.2 (*)    Monocytes Absolute 1.1 (*)    All other components within normal limits  COMPREHENSIVE METABOLIC PANEL - Abnormal; Notable for the following components:   Sodium 129 (*)    Potassium 5.4 (*)    Chloride 95 (*)    Glucose, Bld 119 (*)    BUN 25 (*)    Creatinine, Ser 1.43 (*)    Total Protein 8.3 (*)    AST 100 (*)    ALT 284 (*)    GFR, Estimated 52 (*)    All other components within normal limits  BRAIN NATRIURETIC PEPTIDE - Abnormal; Notable for the following components:   B Natriuretic Peptide 2,130.3 (*)    All other components within normal limits  POTASSIUM - Abnormal; Notable for the following components:   Potassium 5.4 (*)    All other components within normal limits  TROPONIN I (HIGH SENSITIVITY) - Abnormal; Notable for the following components:   Troponin I (High Sensitivity) 114 (*)    All other components within normal limits  TROPONIN I (HIGH SENSITIVITY) - Abnormal; Notable for the following components:   Troponin I (High Sensitivity) 120 (*)    All other components within normal limits  RESP PANEL BY  RT-PCR (RSV, FLU A&B, COVID)  RVPGX2    EKG None  Radiology CT Angio Chest PE W and/or Wo Contrast Result Date: 10/25/2023 CLINICAL DATA:  Pulmonary embolus suspected with high probability. Shortness of breath for 2 weeks. Recent diagnosis of pneumonia and finished antibiotics 2 weeks ago. EXAM: CT ANGIOGRAPHY CHEST WITH CONTRAST TECHNIQUE: Multidetector CT imaging of the chest was performed using  the standard protocol during bolus administration of intravenous contrast. Multiplanar CT image reconstructions and MIPs were obtained to evaluate the vascular anatomy. RADIATION DOSE REDUCTION: This exam was performed according to the departmental dose-optimization program which includes automated exposure control, adjustment of the mA and/or kV according to patient size and/or use of iterative reconstruction technique. CONTRAST:  75mL OMNIPAQUE IOHEXOL 350 MG/ML SOLN COMPARISON:  Chest radiograph 10/25/2023.  CT chest 06/11/2021 FINDINGS: Cardiovascular: Technically adequate study with good opacification of the central and segmental pulmonary arteries. Basilar pulmonary arteries are not well opacified, likely due to early bolus technique. No definitive focal filling defects are demonstrated to suggest acute pulmonary embolus. Cardiac enlargement. Reflux of contrast material into the hepatic veins likely indicating right heart failure. No pericardial effusions. Normal caliber thoracic aorta. Calcification of the aorta and coronary arteries. Mediastinum/Nodes: Thyroid gland is unremarkable. Esophagus is decompressed. No significant lymphadenopathy. Lungs/Pleura: Moderate bilateral pleural effusions with basilar atelectasis. Diffuse interstitial infiltrates in the lungs likely representing interstitial edema. No pneumothorax. Upper Abdomen: No acute abnormalities. Musculoskeletal: No acute bony abnormalities. Review of the MIP images confirms the above findings. IMPRESSION: 1. No definitive evidence of acute  pulmonary embolus. Non opacification of the basilar pulmonary arteries is likely technical. 2. Cardiac enlargement with evidence of right heart failure. 3. Moderate bilateral pleural effusions. 4. Interstitial edema diffusely. 5. Atelectasis in the lung bases is likely compressive but could indicate pneumonia. 6. Aortic atherosclerosis. Electronically Signed   By: Burman Nieves M.D.   On: 10/25/2023 22:46   DG Chest Port 1 View Result Date: 10/25/2023 CLINICAL DATA:  Shortness of breath x2 weeks, history of CHF, recent pneumonia EXAM: PORTABLE CHEST 1 VIEW COMPARISON:  10/15/2023 FINDINGS: Mild interstitial edema, lower lobe predominant. Jihaad Bruschi bilateral pleural effusions, right greater than left. No pneumothorax. The heart is top-normal in size.  Thoracic aortic atherosclerosis. IMPRESSION: Mild interstitial edema. Kollyn Lingafelter bilateral pleural effusions, right greater than left. Electronically Signed   By: Charline Bills M.D.   On: 10/25/2023 21:48    Procedures Procedures    Medications Ordered in ED Medications  ipratropium-albuterol (DUONEB) 0.5-2.5 (3) MG/3ML nebulizer solution 3 mL (3 mLs Nebulization Given 10/25/23 2002)  iohexol (OMNIPAQUE) 350 MG/ML injection 100 mL (75 mLs Intravenous Contrast Given 10/25/23 2119)  furosemide (LASIX) injection 40 mg (40 mg Intravenous Given 10/25/23 2203)    ED Course/ Medical Decision Making/ A&P                                 Medical Decision Making Patient is a 74 year old male, here for shortness of breath, that is been worsening for the last 2 weeks after being taken off antibiotics for possible pneumonia.  He states it is shortness of breath more on exertion, but denies any chest pain, is on any extremity swelling.  History of CHF with EF of 35%.  We will obtain a troponin, BNP, chest x-ray, give him a DuoNeb, for further evaluation he has no pitting edema, thus we will hold off on Lasix for now.  He is overall tachypneic, with a raspy voice on my  exam, but not hypoxic  Amount and/or Complexity of Data Reviewed Labs: ordered.    Details: Troponin elevated at 114, and 120, BNP, 2000+ Radiology: ordered.    Details: Chest x-ray shows mild interstitial edema, with Schneur Crowson bilateral pleural effusions, right greater than left, CTA shows moderate bilateral pleural effusions, with interstitial edema diffusely, there is  also findings of atelectasis versus pneumonia Discussion of management or test interpretation with external provider(s): Given patient's elevated troponin, BNP, improvement with Lasix, I believe that this likely represents acute on chronic CHF exacerbation.  I believe he also has some degree of cardiorenal syndrome, given his elevated serum creatinine, and his elevated troponin is likely secondary to his CHF exacerbation.  He has mild hyperkalemia, which he was given Lasix for, and he is still very tachypneic on my exam, I spoke with Dr. Joneen Roach, given the patient lives alone, cannot do his daily activities of living, and is still dyspneic, and is likely in acute CHF exacerbation, we will admit him for further diuresis, and management.  Risk Prescription drug management. Decision regarding hospitalization.    Final Clinical Impression(s) / ED Diagnoses Final diagnoses:  Acute on chronic congestive heart failure, unspecified heart failure type (HCC)  Elevated serum creatinine  Dyspnea, unspecified type  Elevated troponin  Hyperkalemia    Rx / DC Orders ED Discharge Orders     None         Vegas Fritze Elbert Ewings, PA 10/25/23 2348    Rozelle Logan, DO 10/26/23 1624

## 2023-10-25 NOTE — ED Triage Notes (Signed)
 Pt presents via POV c/o intermittent SOB x 2 weeks. Reports hx of CHF. Reports takes "fluid pill". Also reports dx with pna and finished abx apx 2 weeks ago.   Pt ambulatory to triage. Tachpneic on arrival.

## 2023-10-26 ENCOUNTER — Encounter: Payer: Self-pay | Admitting: Family Medicine

## 2023-10-26 DIAGNOSIS — R0600 Dyspnea, unspecified: Secondary | ICD-10-CM | POA: Diagnosis not present

## 2023-10-26 DIAGNOSIS — I2510 Atherosclerotic heart disease of native coronary artery without angina pectoris: Secondary | ICD-10-CM | POA: Diagnosis present

## 2023-10-26 DIAGNOSIS — R64 Cachexia: Secondary | ICD-10-CM | POA: Diagnosis present

## 2023-10-26 DIAGNOSIS — Z87891 Personal history of nicotine dependence: Secondary | ICD-10-CM | POA: Diagnosis not present

## 2023-10-26 DIAGNOSIS — R1310 Dysphagia, unspecified: Secondary | ICD-10-CM | POA: Diagnosis present

## 2023-10-26 DIAGNOSIS — E875 Hyperkalemia: Secondary | ICD-10-CM | POA: Diagnosis present

## 2023-10-26 DIAGNOSIS — A528 Late syphilis, latent: Secondary | ICD-10-CM | POA: Diagnosis present

## 2023-10-26 DIAGNOSIS — J9601 Acute respiratory failure with hypoxia: Secondary | ICD-10-CM | POA: Diagnosis present

## 2023-10-26 DIAGNOSIS — F1011 Alcohol abuse, in remission: Secondary | ICD-10-CM | POA: Diagnosis present

## 2023-10-26 DIAGNOSIS — I5023 Acute on chronic systolic (congestive) heart failure: Secondary | ICD-10-CM | POA: Diagnosis not present

## 2023-10-26 DIAGNOSIS — J449 Chronic obstructive pulmonary disease, unspecified: Secondary | ICD-10-CM

## 2023-10-26 DIAGNOSIS — I509 Heart failure, unspecified: Secondary | ICD-10-CM | POA: Diagnosis not present

## 2023-10-26 DIAGNOSIS — D500 Iron deficiency anemia secondary to blood loss (chronic): Secondary | ICD-10-CM | POA: Diagnosis present

## 2023-10-26 DIAGNOSIS — I255 Ischemic cardiomyopathy: Secondary | ICD-10-CM | POA: Diagnosis present

## 2023-10-26 DIAGNOSIS — I5082 Biventricular heart failure: Secondary | ICD-10-CM | POA: Diagnosis present

## 2023-10-26 DIAGNOSIS — N179 Acute kidney failure, unspecified: Secondary | ICD-10-CM | POA: Diagnosis not present

## 2023-10-26 DIAGNOSIS — I5043 Acute on chronic combined systolic (congestive) and diastolic (congestive) heart failure: Secondary | ICD-10-CM

## 2023-10-26 DIAGNOSIS — I130 Hypertensive heart and chronic kidney disease with heart failure and stage 1 through stage 4 chronic kidney disease, or unspecified chronic kidney disease: Secondary | ICD-10-CM | POA: Diagnosis present

## 2023-10-26 DIAGNOSIS — Z1152 Encounter for screening for COVID-19: Secondary | ICD-10-CM | POA: Diagnosis not present

## 2023-10-26 DIAGNOSIS — Z66 Do not resuscitate: Secondary | ICD-10-CM | POA: Diagnosis present

## 2023-10-26 DIAGNOSIS — Z681 Body mass index (BMI) 19 or less, adult: Secondary | ICD-10-CM | POA: Diagnosis not present

## 2023-10-26 DIAGNOSIS — E871 Hypo-osmolality and hyponatremia: Secondary | ICD-10-CM | POA: Diagnosis present

## 2023-10-26 DIAGNOSIS — K761 Chronic passive congestion of liver: Secondary | ICD-10-CM | POA: Diagnosis present

## 2023-10-26 DIAGNOSIS — Z7901 Long term (current) use of anticoagulants: Secondary | ICD-10-CM | POA: Diagnosis not present

## 2023-10-26 DIAGNOSIS — N1831 Chronic kidney disease, stage 3a: Secondary | ICD-10-CM | POA: Diagnosis present

## 2023-10-26 DIAGNOSIS — E782 Mixed hyperlipidemia: Secondary | ICD-10-CM

## 2023-10-26 DIAGNOSIS — K219 Gastro-esophageal reflux disease without esophagitis: Secondary | ICD-10-CM | POA: Diagnosis present

## 2023-10-26 LAB — CBC WITH DIFFERENTIAL/PLATELET
Abs Immature Granulocytes: 0.02 10*3/uL (ref 0.00–0.07)
Basophils Absolute: 0 10*3/uL (ref 0.0–0.1)
Basophils Relative: 1 %
Eosinophils Absolute: 0 10*3/uL (ref 0.0–0.5)
Eosinophils Relative: 1 %
HCT: 34 % — ABNORMAL LOW (ref 39.0–52.0)
Hemoglobin: 10 g/dL — ABNORMAL LOW (ref 13.0–17.0)
Immature Granulocytes: 0 %
Lymphocytes Relative: 23 %
Lymphs Abs: 1.1 10*3/uL (ref 0.7–4.0)
MCH: 22.2 pg — ABNORMAL LOW (ref 26.0–34.0)
MCHC: 29.4 g/dL — ABNORMAL LOW (ref 30.0–36.0)
MCV: 75.6 fL — ABNORMAL LOW (ref 80.0–100.0)
Monocytes Absolute: 0.9 10*3/uL (ref 0.1–1.0)
Monocytes Relative: 19 %
Neutro Abs: 2.9 10*3/uL (ref 1.7–7.7)
Neutrophils Relative %: 56 %
Platelets: 243 10*3/uL (ref 150–400)
RBC: 4.5 MIL/uL (ref 4.22–5.81)
RDW: 21.7 % — ABNORMAL HIGH (ref 11.5–15.5)
WBC: 5 10*3/uL (ref 4.0–10.5)
nRBC: 0.6 % — ABNORMAL HIGH (ref 0.0–0.2)

## 2023-10-26 LAB — C-REACTIVE PROTEIN: CRP: 3.5 mg/dL — ABNORMAL HIGH (ref <1.0)

## 2023-10-26 LAB — MAGNESIUM: Magnesium: 2.3 mg/dL (ref 1.7–2.4)

## 2023-10-26 LAB — PROCALCITONIN: Procalcitonin: <0.1 ng/mL

## 2023-10-26 LAB — BASIC METABOLIC PANEL
Anion gap: 9 (ref 5–15)
BUN: 26 mg/dL — ABNORMAL HIGH (ref 8–23)
CO2: 26 mmol/L (ref 22–32)
Calcium: 9.2 mg/dL (ref 8.9–10.3)
Chloride: 96 mmol/L — ABNORMAL LOW (ref 98–111)
Creatinine, Ser: 1.45 mg/dL — ABNORMAL HIGH (ref 0.61–1.24)
GFR, Estimated: 51 mL/min — ABNORMAL LOW (ref >60)
Glucose, Bld: 110 mg/dL — ABNORMAL HIGH (ref 70–99)
Potassium: 4.8 mmol/L (ref 3.5–5.1)
Sodium: 131 mmol/L — ABNORMAL LOW (ref 135–145)

## 2023-10-26 MED ORDER — SPIRONOLACTONE 25 MG PO TABS: 25.0000 mg | ORAL_TABLET | Freq: Every day | ORAL | Status: DC

## 2023-10-26 MED ORDER — IPRATROPIUM BROMIDE 0.06 % NA SOLN
2.0000 | Freq: Two times a day (BID) | NASAL | Status: DC
  Administered 2023-10-26 – 2023-10-28 (×3): 2 via NASAL
  Filled 2023-10-26: qty 15

## 2023-10-26 MED ORDER — PRAVASTATIN SODIUM 40 MG PO TABS
80.0000 mg | ORAL_TABLET | Freq: Every day | ORAL | Status: DC
  Administered 2023-10-27 – 2023-10-28 (×2): 80 mg via ORAL
  Filled 2023-10-26 (×2): qty 2

## 2023-10-26 MED ORDER — ONDANSETRON HCL 4 MG/2ML IJ SOLN: 4.0000 mg | Freq: Four times a day (QID) | INTRAMUSCULAR | Status: DC | PRN

## 2023-10-26 MED ORDER — IPRATROPIUM BROMIDE 0.03 % NA SOLN
2.0000 | Freq: Two times a day (BID) | NASAL | Status: DC
  Filled 2023-10-26: qty 0.2
  Filled 2023-10-26: qty 30

## 2023-10-26 MED ORDER — IVABRADINE 2.5 MG HALF TABLET
2.5000 mg | ORAL_TABLET | Freq: Two times a day (BID) | ORAL | Status: DC
  Administered 2023-10-27: 2.5 mg via ORAL
  Filled 2023-10-26: qty 1

## 2023-10-26 MED ORDER — ALBUTEROL SULFATE (2.5 MG/3ML) 0.083% IN NEBU: 2.5000 mg | INHALATION_SOLUTION | Freq: Four times a day (QID) | RESPIRATORY_TRACT | Status: DC | PRN

## 2023-10-26 MED ORDER — ACETAMINOPHEN 325 MG PO TABS
650.0000 mg | ORAL_TABLET | Freq: Four times a day (QID) | ORAL | Status: DC | PRN
  Administered 2023-10-27 – 2023-10-28 (×2): 650 mg via ORAL
  Filled 2023-10-26 (×3): qty 2

## 2023-10-26 MED ORDER — PANTOPRAZOLE SODIUM 40 MG PO TBEC
40.0000 mg | DELAYED_RELEASE_TABLET | Freq: Every day | ORAL | Status: DC
  Administered 2023-10-26 – 2023-10-29 (×4): 40 mg via ORAL
  Filled 2023-10-26 (×4): qty 1

## 2023-10-26 MED ORDER — APIXABAN 5 MG PO TABS
5.0000 mg | ORAL_TABLET | Freq: Two times a day (BID) | ORAL | Status: DC
  Administered 2023-10-26 – 2023-10-29 (×7): 5 mg via ORAL
  Filled 2023-10-26 (×3): qty 1
  Filled 2023-10-26: qty 2
  Filled 2023-10-26 (×3): qty 1

## 2023-10-26 MED ORDER — SACUBITRIL-VALSARTAN 24-26 MG PO TABS: 1.0000 | ORAL_TABLET | Freq: Two times a day (BID) | ORAL | Status: DC

## 2023-10-26 MED ORDER — FUROSEMIDE 10 MG/ML IJ SOLN
40.0000 mg | Freq: Two times a day (BID) | INTRAMUSCULAR | Status: DC
  Administered 2023-10-26 (×2): 40 mg via INTRAVENOUS
  Filled 2023-10-26 (×2): qty 4

## 2023-10-26 MED ORDER — POLYETHYLENE GLYCOL 3350 17 G PO PACK: 17.0000 g | PACK | Freq: Every day | ORAL | Status: DC | PRN

## 2023-10-26 MED ORDER — ASPIRIN 81 MG PO TBEC
81.0000 mg | DELAYED_RELEASE_TABLET | Freq: Every day | ORAL | Status: DC
  Administered 2023-10-26 – 2023-10-27 (×2): 81 mg via ORAL
  Filled 2023-10-26 (×2): qty 1

## 2023-10-26 MED ORDER — DOCUSATE SODIUM 100 MG PO CAPS
200.0000 mg | ORAL_CAPSULE | Freq: Two times a day (BID) | ORAL | Status: DC
  Administered 2023-10-26 – 2023-10-29 (×6): 200 mg via ORAL
  Filled 2023-10-26 (×6): qty 2

## 2023-10-26 NOTE — Plan of Care (Signed)

## 2023-10-26 NOTE — H&P (Addendum)
 TRH H&P   Patient Demographics:    Collin Young, is a 73 y.o. male  MRN: [id number]   DOB - 1950-12-24  Admit Date - 10/25/2023  Outpatient Primary MD for the patient is Copland, Gwenlyn Found, MD  Outpatient Specialists: Cardiologist Dr. Gala Romney  Patient coming from: [facility]  Chief Complaint  Patient presents with   Shortness of Breath      HPI:    Collin Young  is a 73 y.o. male, with history of ischemic cardiomyopathy, chronic combined systolic and diastolic CHF EF 40% on last echocardiogram, COPD, CAD, GERD, history of esophageal cancer followed by Dr. Myna Hidalgo, history of prostate cancer, remote history of alcohol abuse quit in 2001, chronic iron deficiency anemia, hypertension, dyslipidemia, CKD stage IIIa baseline creatinine around 1.4, history of PE on Eliquis.  With above history who is at home, fairly independent still drives, was diagnosed with community-acquired pneumonia about 2 weeks ago and was treated with antibiotics, he felt better for a few days but started developing shortness of breath along with orthopnea over the last 2 to 3 days, he presented to [facility] where he had a CT angiogram of the chest along with other workup.  He was diagnosed with acute on chronic CHF, he was given Lasix, placed on oxygen for shortness of breath, subsequently he furred for admission to [facility].  Patient currently after receiving Lasix and 2 L supplemental oxygen is feeling a whole lot better, he has made significant amounts of urine and [facility] yesterday, currently he denies any headache, no fever chills, did have exertional shortness of breath and orthopnea for the last 2 days which has  now significantly improved, no chest or abdominal pain, no blood in stool or urine, no dysuria, no focal weakness.  No significant edema in his legs.    Review of systems:     A full 10 point Review of Systems was done, except as stated above, all other Review of Systems were negative.   With Past History of the following :    Past Medical History:  Diagnosis Date   Chronic combined systolic and diastolic CHF (congestive heart failure) (HCC)    followed by dr t. Duke Salvia   COPD (chronic obstructive pulmonary disease) [facility])    Coronary artery disease cardiologist-- dr Chilton Si  per cardiac cath 01-05-2017  chronic total occlusion pLCx with collaterals, 99% severe calcified prox. to mid RCA, otherwise mild to moderate CAD (medically managed)   Esophageal cancer, stage IIIB [facility]) oncologist-  dr ennever/  dr moody   dx 2010  SCC Stage IIIB completed chemoradiation;  localized recurrent left piriform sinus 01/ 2015,  completed concurrent chemoradiatoin 04/ 2015   GERD (gastroesophageal reflux disease)    09-07-2018   no issues since Gtube removed 06/ 2019   History of alcohol abuse    quit 2001   History of cancer chemotherapy    2010;   2015   History of radiation therapy    10-19-2013 to  12-05-2013 pyriform sinus 69.96 Gy/55fx;   Radiation completed 2010 for esophageal cancer   History of seizure 2001   alcohol withdrawal   HOH (hard of hearing)    Hyperlipidemia    Hypertension    Iron deficiency anemia due to chronic blood loss 11/14/2021   Ischemic cardiomyopathy 12/2016   01-03-2017  echo,  ef 10-15%/   echo 05-06-2017 EF improved to 45-50%   Prostate cancer [facility]) urologist-  dr ottelin/  oncologist-- dr Kathrynn Running   dx 05-27-2018--- Stage T1c,  Gleason 4+3,  PSA 9.3--  scheduled for brachytherapy 09-23-2017   Renal cyst, left       Past Surgical History:  Procedure Laterality Date   BIOPSY  12/18/2021   Procedure: BIOPSY;  Surgeon: Rachael Fee, MD;  Location:  [facility];  Service: Gastroenterology;;   CARDIOVASCULAR STRESS TEST  10/09/09   normal nuclearr stress test, EF 57% Adolph Pollack)   CENTRAL LINE INSERTION  06/16/2021   Procedure: CENTRAL LINE INSERTION;  Surgeon: Dolores Patty, MD;  Location: [facility] INVASIVE CV LAB;  Service: Cardiovascular;;   ESOPHAGOGASTRODUODENOSCOPY (EGD) WITH PROPOFOL N/A 12/18/2021   Procedure: ESOPHAGOGASTRODUODENOSCOPY (EGD) WITH PROPOFOL;  Surgeon: Rachael Fee, MD;  Location: [facility];  Service: Gastroenterology;  Laterality: N/A;   IR GASTROSTOMY TUBE MOD SED  01/13/2017   IR GASTROSTOMY TUBE REMOVAL  02/03/2018   IR PATIENT EVAL TECH 0-60 MINS  03/25/2017   IR REMOVAL TUN ACCESS W/ PORT W/O FL MOD SED  01/15/2017   IR REPLACE G-TUBE SIMPLE WO FLUORO  01/13/2018   LARYNGOSCOPY N/A 09/15/2013   Procedure: LARYNGOSCOPY;  Surgeon: Christia Reading, MD;  [facility];  Service: ENT;  Laterality: N/A;  direct laryngoscopy with biopsy and esophagoscopy   RADIOACTIVE SEED IMPLANT N/A 09/23/2018   Procedure: RADIOACTIVE SEED IMPLANT/BRACHYTHERAPY IMPLANT;  Surgeon: Ihor Gully, MD;  Location: [facility];  Service: Urology;  Laterality: N/A;   RIGHT HEART CATH N/A 06/16/2021   Procedure: RIGHT HEART CATH;  Surgeon: Dolores Patty, MD;  Location: [facility]VASIVE CV LAB;  Service: Cardiovascular;  Laterality: N/A;   RIGHT/LEFT HEART CATH AND CORONARY ANGIOGRAPHY N/A 01/05/2017   Procedure: Right/Left Heart Cath and Coronary Angiography;  Surgeon: Kathleene Hazel, MD;  Location: [facility] INVASIVE CV LAB;  Service: Cardiovascular;  Laterality: N/A;   TRANSTHORACIC ECHOCARDIOGRAM  05/06/2017   ef 45-50%,  grade 1 diastolic dysfunction/  AV severe calcified non coronary cusp with moderate regurg. , no stenosis (valve area per echo 01-05-2017 1.08cm^2)/  mild MR, TR, and PR/  mild LAE      Social History:     Social History   Tobacco Use   Smoking status: Former    Current packs/day: 0.00    Average  packs/day: 1 pack/day for 48.8 years (48.8 ttl  pk-yrs)    Types: Cigarettes    Start date: 11/08/1960    Quit date: 08/31/2009    Years since quitting: 14.1   Smokeless tobacco: Former    Types: Chew    Quit date: 09/01/1999  Substance Use Topics   Alcohol use: Not Currently    Alcohol/week: 0.0 standard drinks of alcohol    Comment: quit in 2001       Family History :     Family History  Problem Relation Age of Onset   Breast cancer Mother    Prostate cancer Neg Hx    Colon cancer Neg Hx    Pancreatic cancer Neg Hx       Home Medications:   Prior to Admission medications   Medication Sig Start Date End Date Taking? Authorizing Provider  albuterol (VENTOLIN HFA) 108 (90 Base) MCG/ACT inhaler Inhale 2 puffs into the lungs every 6 (six) hours as needed for wheezing or shortness of breath. 10/06/22  Yes Copland, Gwenlyn Found, MD  BAYER LOW DOSE 81 MG EC tablet Take 81 mg by mouth daily. Swallow whole.   Yes [provider]  CORLANOR 5 MG TABS tablet TAKE 1/2 TABLET(2.5 MG) BY MOUTH TWICE DAILY WITH A MEAL 05/25/23  Yes Milford, Anderson Malta, FNP  dapagliflozin propanediol (FARXIGA) 10 MG TABS tablet Take 1 tablet (10 mg total) by mouth daily. 04/05/23  Yes Bensimhon, Bevelyn Buckles, MD  ELIQUIS 5 MG TABS tablet TAKE 1 TABLET(5 MG) BY MOUTH TWICE DAILY 09/07/23  Yes Bensimhon, Bevelyn Buckles, MD  Evolocumab (REPATHA SURECLICK) 140 MG/ML SOAJ Inject 140 mg into the skin every 14 (fourteen) days. Please keep your upcoming appointment for refills. 02/01/23  Yes Alver Sorrow, NP  fentaNYL (DURAGESIC) 12 MCG/HR Place 1 patch onto the skin every 3 (three) days. 10/05/23  Yes Josph Macho, MD  furosemide (LASIX) 40 MG tablet Take 1 tablet (40 mg total) by mouth as needed. 12/31/21 10/26/23 Yes Bensimhon, Bevelyn Buckles, MD  ipratropium (ATROVENT) 0.03 % nasal spray Place 2 sprays into both nostrils every 12 (twelve) hours. 07/03/21  Yes Copland, Gwenlyn Found, MD  polyethylene glycol (MIRALAX / GLYCOLAX) 17 g packet  Take 17 g by mouth daily as needed for mild constipation. 05/11/21  Yes Alwyn Ren, MD  pravastatin (PRAVACHOL) 80 MG tablet TAKE 1 TABLET(80 MG) BY MOUTH EVERY EVENING 08/09/23  Yes Copland, Gwenlyn Found, MD  sacubitril-valsartan (ENTRESTO) 24-26 MG Take 1 tablet by mouth 2 (two) times daily. 06/29/23  Yes Bensimhon, Bevelyn Buckles, MD  spironolactone (ALDACTONE) 25 MG tablet Take 1 tablet (25 mg total) by mouth as needed. 01/18/23  Yes Bensimhon, Bevelyn Buckles, MD  azithromycin (ZITHROMAX Z-PAK) 250 MG tablet Take 2 tablets together with food on day 1. Then take one tablet daily for days 2-5. Patient not taking: Reported on 10/26/2023 10/05/23   Rushie Chestnut, PA-C  tadalafil (CIALIS) 20 MG tablet Take 0.5-1 tablets (10-20 mg total) by mouth every other day as needed for erectile dysfunction. 10/06/23   Copland, Gwenlyn Found, MD  umeclidinium bromide (INCRUSE ELLIPTA) 62.5 MCG/ACT AEPB Inhale 1 puff into the lungs daily. Patient not taking: Reported on 10/26/2023 09/18/22   Copland, Gwenlyn Found, MD     Allergies:    No Known Allergies   Physical Exam:   Vitals  Blood pressure (!) 135/96, pulse (!) 104, temperature 98.7 F (37.1 C), resp. rate 18, SpO2 90%.   1. General Elderly African-American gentleman sitting up in hospital bed with  2 L nasal cannula oxygen in no distress  2. Normal affect and insight, Not Suicidal or Homicidal, Awake Alert,   3. No F.N deficits, ALL C.Nerves Intact, Strength 5/5 all 4 extremities, Sensation intact all 4 extremities, Plantars down going.  4. Ears and Eyes appear Normal, Conjunctivae clear, PERRLA. Moist Oral Mucosa.  5. Supple Neck, No JVD, No cervical lymphadenopathy appriciated, No Carotid Bruits.  6. Symmetrical Chest wall movement, Good air movement bilaterally, mild bibasilar crackles,  7. RRR, No Gallops, Rubs or Murmurs, No Parasternal Heave.  8. Positive Bowel Sounds, Abdomen Soft, No tenderness, No organomegaly appriciated,No rebound -guarding or  rigidity.  9.  No Cyanosis, Normal Skin Turgor, No Skin Rash or Bruise.  No lower extremity pitting edema  10. Good muscle tone,  joints appear normal , no effusions, Normal ROM.  11. No Palpable Lymph Nodes in Neck or Axillae     Data Review:   Recent Labs  Lab 10/25/23 1946 10/26/23 0449  WBC 5.0 5.0  HGB 10.9* 10.0*  HCT 38.4* 34.0*  PLT 248 243  MCV 78.4* 75.6*  MCH 22.2* 22.2*  MCHC 28.4* 29.4*  RDW 22.2* 21.7*  LYMPHSABS 1.0 1.1  MONOABS 1.1* 0.9  EOSABS 0.0 0.0  BASOSABS 0.0 0.0    Recent Labs  Lab 10/25/23 1945 10/25/23 1954 10/25/23 2054 10/26/23 0449  NA  --  129*  --  131*  K  --  5.4* 5.4* 4.8  CL  --  95*  --  96*  CO2  --  25  --  26  ANIONGAP  --  9  --  9  GLUCOSE  --  119*  --  110*  BUN  --  25*  --  26*  CREATININE  --  1.43*  --  1.45*  AST  --  100*  --   --   ALT  --  284*  --   --   ALKPHOS  --  58  --   --   BILITOT  --  0.4  --   --   ALBUMIN  --  4.2  --   --   BNP 2,130.3*  --   --   --   MG  --   --   --  2.3  CALCIUM  --  9.8  --  9.2    Lab Results  Component Value Date   CHOL 120 02/01/2023   HDL 76 02/01/2023   LDLCALC 32 02/01/2023   LDLDIRECT 38 02/01/2023   TRIG 48 02/01/2023   CHOLHDL 1.6 02/01/2023    Recent Labs  Lab 10/25/23 1945 10/25/23 1954 10/26/23 0449  BNP 2,130.3*  --   --   MG  --   --  2.3  CALCIUM  --  9.8 9.2    Recent Labs  Lab 10/25/23 1946 10/25/23 1954 10/26/23 0449  WBC 5.0  --  5.0  PLT 248  --  243  CREATININE  --  1.43* 1.45*    Urinalysis    Component Value Date/Time   COLORURINE YELLOW 05/07/2021 2345   APPEARANCEUR HAZY (A) 05/07/2021 2345   APPEARANCEUR Clear 05/29/2020 1556   LABSPEC 1.023 05/07/2021 2345   PHURINE 5.0 05/07/2021 2345   GLUCOSEU NEGATIVE 05/07/2021 2345   HGBUR NEGATIVE 05/07/2021 2345   BILIRUBINUR NEGATIVE 05/07/2021 2345   BILIRUBINUR Negative 05/29/2020 1556   KETONESUR NEGATIVE 05/07/2021 2345   PROTEINUR 100 (A) 05/07/2021 2345    UROBILINOGEN 1.0 08/04/2014 2007  NITRITE NEGATIVE 05/07/2021 2345   LEUKOCYTESUR TRACE (A) 05/07/2021 2345      Imaging Results:    CT Angio Chest PE W and/or Wo Contrast Result Date: 10/25/2023 CLINICAL DATA:  Pulmonary embolus suspected with high probability. Shortness of breath for 2 weeks. Recent diagnosis of pneumonia and finished antibiotics 2 weeks ago. EXAM: CT ANGIOGRAPHY CHEST WITH CONTRAST TECHNIQUE: Multidetector CT imaging of the chest was performed using the standard protocol during bolus administration of intravenous contrast. Multiplanar CT image reconstructions and MIPs were obtained to evaluate the vascular anatomy. RADIATION DOSE REDUCTION: This exam was performed according to the departmental dose-optimization program which includes automated exposure control, adjustment of the mA and/or kV according to patient size and/or use of iterative reconstruction technique. CONTRAST:  75mL OMNIPAQUE IOHEXOL 350 MG/ML SOLN COMPARISON:  Chest radiograph 10/25/2023.  CT chest 06/11/2021 FINDINGS: Cardiovascular: Technically adequate study with good opacification of the central and segmental pulmonary arteries. Basilar pulmonary arteries are not well opacified, likely due to early bolus technique. No definitive focal filling defects are demonstrated to suggest acute pulmonary embolus. Cardiac enlargement. Reflux of contrast material into the hepatic veins likely indicating right heart failure. No pericardial effusions. Normal caliber thoracic aorta. Calcification of the aorta and coronary arteries. Mediastinum/Nodes: Thyroid gland is unremarkable. Esophagus is decompressed. No significant lymphadenopathy. Lungs/Pleura: Moderate bilateral pleural effusions with basilar atelectasis. Diffuse interstitial infiltrates in the lungs likely representing interstitial edema. No pneumothorax. Upper Abdomen: No acute abnormalities. Musculoskeletal: No acute bony abnormalities. Review of the MIP images confirms  the above findings. IMPRESSION: 1. No definitive evidence of acute pulmonary embolus. Non opacification of the basilar pulmonary arteries is likely technical. 2. Cardiac enlargement with evidence of right heart failure. 3. Moderate bilateral pleural effusions. 4. Interstitial edema diffusely. 5. Atelectasis in the lung bases is likely compressive but could indicate pneumonia. 6. Aortic atherosclerosis. Electronically Signed   By: Burman Nieves M.D.   On: 10/25/2023 22:46   DG Chest Port 1 View Result Date: 10/25/2023 CLINICAL DATA:  Shortness of breath x2 weeks, history of CHF, recent pneumonia EXAM: PORTABLE CHEST 1 VIEW COMPARISON:  10/15/2023 FINDINGS: Mild interstitial edema, lower lobe predominant. Small bilateral pleural effusions, right greater than left. No pneumothorax. The heart is top-normal in size.  Thoracic aortic atherosclerosis. IMPRESSION: Mild interstitial edema. Small bilateral pleural effusions, right greater than left. Electronically Signed   By: Charline Bills M.D.   On: 10/25/2023 21:48    My personal review of EKG: Rhythm sinus tachycardia heart rate around 105 bpm, no acute ST changes   Assessment & Plan:   1.  Acute on chronic combined systolic and diastolic CHF EF 84% presenting with acute hypoxic respiratory failure.  Recently recovered from community-acquired pneumonia, viral panel negative, CTA rules out PE, has already responded very well to IV Lasix which will be continued, supplemental oxygen, encouraged to sit in chair with I-S and flutter valve for pulmonary toiletry, continue Entresto, Aldactone, as he is tachycardic and blood pressure is still high we will continue his home Corlanor, placed on fluid restriction, advance activity and titrate down oxygen.  Cardiology to see in the morning.  2.  CAD/PAD with ischemic cardiomyopathy.  No acute issues.  Continue aspirin, statin for secondary prevention, as needed sublingual nitroglycerin, EKG nonacute no acute  issues.  3.  History of PE.  On Eliquis continue.  CTA rules out PE.  4.  Moderate bilateral pleural effusions noted.  For now diurese and monitor, clinically stable, do not think  currently thoracentesis is indicated in the light of risks and benefits.  Continue with diuretics, completely symptom-free on 2 L nasal cannula oxygen.  5.  Dyslipidemia.  Statin here outpatient Repatha.  6.  CKD 3A.  Baseline creatinine around 1.45.  Stable.  Resume Marcelline Deist if creatinine remains stable after diuresis, monitor.  7.  Hyponatremia.  Due to CHF, diurese, fluid restrict and monitor.  8.  GERD.  Placed on PPI.  9.  HX of esophageal and prostate cancer.  No acute issues follow-up with Dr. Myna Hidalgo postdischarge.  10.  Asymptomatic transaminitis upon admission.  Likely due to hepatic congestion from CHF, diurese and monitor, repeat LFTs in the morning.  Total bilirubin stable.  INR likely will be elevated as he is already on Eliquis.  11.  Note patient chronically on fentanyl patch.  He had a patch placed yesterday, readdress on a daily basis.  12.  History of COPD.  Stable no wheezing.  Supportive care.     DVT Prophylaxis Eliquis  AM Labs Ordered, also please review Full Orders  Family Communication: Admission, patients condition and plan of care including tests being ordered have been discussed with the patient and family members who indicate understanding and agree with the plan and Code Status.  Code Status DNR  Likely DC to home in 2 to 3 days  Condition Fair  Consults called: Cards    Admission status: Inpt    Time spent in minutes : 35  Signature  -    Susa Raring M.D on 10/26/2023 at 5:16 PM   -  To page go to [email or web address]

## 2023-10-26 NOTE — Progress Notes (Signed)
 PHARMACY ROUNDING NOTE  Collin Young is a 73 y.o. male awaiting admission. A chart review was completed to evaluate prior to admission medications, antibiotic therapy and labs/vitals. The following interventions were made:  Apixaban restarted  This was discussed with the ED or admitting provider and/or nurse/paramedic.   Lysle Pearl, PharmD, BCPS, BCEMP Clinical Pharmacist Please see AMION for all pharmacy numbers 10/26/2023 8:14 AM

## 2023-10-26 NOTE — Progress Notes (Signed)
 Pt has a Fentanyl pain patch to the back of nis neck.  He stated that it was placed on Sunday 10/24/23

## 2023-10-26 NOTE — ED Notes (Signed)
 Patient placed on [facility] by RN at this time due to desaturations while sleeping.

## 2023-10-26 NOTE — ED Provider Notes (Signed)
 73 year old male history of CHF presented yesterday with dyspnea.  He was seen evaluated here in the ED and felt to have acute CHF exacerbation.  He was admitted to the hospitalist.  He has remained here in the ED waiting bed assignment. Labs are reviewed from today and are stable with hemoglobin of 10 decreased from 10.9 yesterday Glucose stable at 110 Troponin has increased from 1 14-1 20 Odium 131 BUN 26 creatinine 1.45 which appear stable from yesterday Labs reviewed Blood pressure 145/88 Afebrile Heart rate 103 Respiratory 21 Sats 100% Patient has received Lasix 40 mg IV last dose given at 948 He is on his Eliquis Patient remained stable pending admission to [facility], MD 10/26/23 1154

## 2023-10-27 ENCOUNTER — Inpatient Hospital Stay (HOSPITAL_COMMUNITY): Payer: 59

## 2023-10-27 DIAGNOSIS — I5043 Acute on chronic combined systolic (congestive) and diastolic (congestive) heart failure: Secondary | ICD-10-CM | POA: Diagnosis not present

## 2023-10-27 DIAGNOSIS — I5082 Biventricular heart failure: Secondary | ICD-10-CM | POA: Diagnosis not present

## 2023-10-27 DIAGNOSIS — I5023 Acute on chronic systolic (congestive) heart failure: Secondary | ICD-10-CM

## 2023-10-27 LAB — TSH: TSH: 5.003 u[IU]/mL — ABNORMAL HIGH (ref 0.350–4.500)

## 2023-10-27 LAB — COMPREHENSIVE METABOLIC PANEL
ALT: 282 U/L — ABNORMAL HIGH (ref 0–44)
AST: 131 U/L — ABNORMAL HIGH (ref 15–41)
Albumin: 3.3 g/dL — ABNORMAL LOW (ref 3.5–5.0)
Alkaline Phosphatase: 58 U/L (ref 38–126)
Anion gap: 13 (ref 5–15)
BUN: 30 mg/dL — ABNORMAL HIGH (ref 8–23)
CO2: 26 mmol/L (ref 22–32)
Calcium: 9.1 mg/dL (ref 8.9–10.3)
Chloride: 94 mmol/L — ABNORMAL LOW (ref 98–111)
Creatinine, Ser: 1.69 mg/dL — ABNORMAL HIGH (ref 0.61–1.24)
GFR, Estimated: 43 mL/min — ABNORMAL LOW (ref >60)
Glucose, Bld: 110 mg/dL — ABNORMAL HIGH (ref 70–99)
Potassium: 5 mmol/L (ref 3.5–5.1)
Sodium: 133 mmol/L — ABNORMAL LOW (ref 135–145)
Total Bilirubin: 0.5 mg/dL (ref 0.0–1.2)
Total Protein: 7.3 g/dL (ref 6.5–8.1)

## 2023-10-27 LAB — ECHOCARDIOGRAM COMPLETE
AR max vel: 2.03 cm2
AV Area VTI: 2.04 cm2
AV Area mean vel: 2.04 cm2
AV Mean grad: 5 mm[Hg]
AV Peak grad: 9.7 mm[Hg]
Ao pk vel: 1.56 m/s
Area-P 1/2: 4.89 cm2
Calc EF: 21.7 %
Height: 66 in
MV VTI: 1.86 cm2
P 1/2 time: 501 ms
S' Lateral: 5.1 cm
Single Plane A2C EF: 27.2 %
Single Plane A4C EF: 14.5 %
Weight: 2091.72 [oz_av]

## 2023-10-27 LAB — CBC WITH DIFFERENTIAL/PLATELET
Abs Immature Granulocytes: 0.02 10*3/uL (ref 0.00–0.07)
Basophils Absolute: 0 10*3/uL (ref 0.0–0.1)
Basophils Relative: 1 %
Eosinophils Absolute: 0 10*3/uL (ref 0.0–0.5)
Eosinophils Relative: 1 %
HCT: 35.1 % — ABNORMAL LOW (ref 39.0–52.0)
Hemoglobin: 10.3 g/dL — ABNORMAL LOW (ref 13.0–17.0)
Immature Granulocytes: 0 %
Lymphocytes Relative: 19 %
Lymphs Abs: 0.9 10*3/uL (ref 0.7–4.0)
MCH: 22.3 pg — ABNORMAL LOW (ref 26.0–34.0)
MCHC: 29.3 g/dL — ABNORMAL LOW (ref 30.0–36.0)
MCV: 76 fL — ABNORMAL LOW (ref 80.0–100.0)
Monocytes Absolute: 0.9 10*3/uL (ref 0.1–1.0)
Monocytes Relative: 18 %
Neutro Abs: 2.9 10*3/uL (ref 1.7–7.7)
Neutrophils Relative %: 61 %
Platelets: 250 10*3/uL (ref 150–400)
RBC: 4.62 MIL/uL (ref 4.22–5.81)
RDW: 22.1 % — ABNORMAL HIGH (ref 11.5–15.5)
WBC: 4.8 10*3/uL (ref 4.0–10.5)
nRBC: 0 % (ref 0.0–0.2)

## 2023-10-27 LAB — BRAIN NATRIURETIC PEPTIDE: B Natriuretic Peptide: 3497.7 pg/mL — ABNORMAL HIGH (ref 0.0–100.0)

## 2023-10-27 LAB — PHOSPHORUS: Phosphorus: 4.8 mg/dL — ABNORMAL HIGH (ref 2.5–4.6)

## 2023-10-27 LAB — MAGNESIUM: Magnesium: 2.3 mg/dL (ref 1.7–2.4)

## 2023-10-27 LAB — PROTIME-INR
INR: 2.2 — ABNORMAL HIGH (ref 0.8–1.2)
Prothrombin Time: 24.6 s — ABNORMAL HIGH (ref 11.4–15.2)

## 2023-10-27 MED ORDER — FUROSEMIDE 10 MG/ML IJ SOLN
40.0000 mg | Freq: Two times a day (BID) | INTRAMUSCULAR | Status: DC
  Administered 2023-10-27 – 2023-10-28 (×3): 40 mg via INTRAVENOUS
  Filled 2023-10-27 (×3): qty 4

## 2023-10-27 MED ORDER — SACUBITRIL-VALSARTAN 24-26 MG PO TABS
1.0000 | ORAL_TABLET | Freq: Two times a day (BID) | ORAL | Status: DC
  Administered 2023-10-27 – 2023-10-29 (×5): 1 via ORAL
  Filled 2023-10-27 (×5): qty 1

## 2023-10-27 MED ORDER — DAPAGLIFLOZIN PROPANEDIOL 10 MG PO TABS
10.0000 mg | ORAL_TABLET | Freq: Every day | ORAL | Status: DC
  Administered 2023-10-27 – 2023-10-29 (×3): 10 mg via ORAL
  Filled 2023-10-27 (×3): qty 1

## 2023-10-27 MED ORDER — FUROSEMIDE 10 MG/ML IJ SOLN: 40.0000 mg | Freq: Every day | INTRAMUSCULAR | Status: DC

## 2023-10-27 MED ORDER — PERFLUTREN LIPID MICROSPHERE
1.0000 mL | INTRAVENOUS | Status: AC | PRN
Start: 2023-10-27 — End: 2023-10-27
  Administered 2023-10-27: 2 mL via INTRAVENOUS

## 2023-10-27 MED ORDER — SODIUM CHLORIDE 0.9 % IV SOLN: 250.0000 mg | Freq: Every day | INTRAVENOUS | Status: DC

## 2023-10-27 MED ORDER — SPIRONOLACTONE 25 MG PO TABS: 25.0000 mg | ORAL_TABLET | Freq: Every day | ORAL | Status: DC

## 2023-10-27 MED ORDER — FENTANYL 25 MCG/HR TD PT72
1.0000 | MEDICATED_PATCH | TRANSDERMAL | Status: DC
  Administered 2023-10-27: 1 via TRANSDERMAL
  Filled 2023-10-27: qty 1

## 2023-10-27 MED ORDER — SACUBITRIL-VALSARTAN 24-26 MG PO TABS: 1.0000 | ORAL_TABLET | Freq: Two times a day (BID) | ORAL | Status: DC

## 2023-10-27 NOTE — Progress Notes (Signed)
 Mobility Specialist Progress Note:   10/27/23 1145  Mobility  Activity Ambulated independently in room  Level of Assistance Independent  Assistive Device None  Distance Ambulated (ft) 30 ft  Activity Response Tolerated well  Mobility Referral Yes  Mobility visit 1 Mobility  Mobility Specialist Start Time (ACUTE ONLY) 1145  Mobility Specialist Stop Time (ACUTE ONLY) 1200  Mobility Specialist Time Calculation (min) (ACUTE ONLY) 15 min   Pt recently had PT session, agreeable to minimal mobility at this time. Required no physical assistance throughout ambulation. Denies SOB, SpO2 89 once seated in chair, recovered to 94% quickly. Pt back in chair with all needs met.    Addison Lank Mobility Specialist Please contact via SecureChat or  Rehab office at [phone]

## 2023-10-27 NOTE — TOC Initial Note (Signed)
 Transition of Care [facility]) - Initial/Assessment Note    Patient Details  Name: Collin Young MRN: [id number] Date of Birth: 06/19/1951  Transition of Care [facility]) CM/SW Contact:    Nicanor Bake Phone Number: [phone] 10/27/2023, 2:09 PM  Clinical Narrative:    HF CSW met with pt and his pastor at bedside. Pt stated that he live alone. Pt stated that he has not history of HH services. Pt stated that he uses oxygen at home. Pt stated that he has a scale at home. PT stated that he has a PCP. CSW explained that a hospital follow up appointment will be closer to dc. Pt agrees. Pt stated that he will be transported by family at dc. Patients pastor also stated that if transportation is needed he can assist also.   TOC will continue following.                Expected Discharge Plan: Home/Self Care Barriers to Discharge: Continued Medical Work up   Patient Goals and CMS Choice Patient states their goals for this hospitalization and ongoing recovery are:: get better          Expected Discharge Plan and Services       Living arrangements for the past 2 months: Apartment                                      Prior Living Arrangements/Services Living arrangements for the past 2 months: Apartment Lives with:: Self   Do you feel safe going back to the place where you live?: Yes      Need for Family Participation in Patient Care: No (Comment) Care giver support system in place?: Yes (comment)   Criminal Activity/Legal Involvement Pertinent to Current Situation/Hospitalization: No - Comment as needed  Activities of Daily Living   ADL Screening (condition at time of admission) Independently performs ADLs?: Yes (appropriate for developmental age) Is the patient deaf or have difficulty hearing?: Yes Does the patient have difficulty seeing, even when wearing glasses/contacts?: No Does the patient have difficulty concentrating, remembering, or making decisions?:  No  Permission Sought/Granted                  Emotional Assessment Appearance:: Appears stated age Attitude/Demeanor/Rapport: Engaged Affect (typically observed): Appropriate Orientation: : Oriented to Self, Oriented to Place, Oriented to  Time, Oriented to Situation Alcohol / Substance Use: Not Applicable Psych Involvement: No (comment)  Admission diagnosis:  Hyperkalemia [E87.5] Elevated serum creatinine [R79.89] Elevated troponin [R79.89] Acute on chronic congestive heart failure (HCC) [I50.9] Dyspnea, unspecified type [R06.00] Acute on chronic congestive heart failure, unspecified heart failure type (HCC) [I50.9] Patient Active Problem List   Diagnosis Date Noted   Acute on chronic congestive heart failure (HCC) 10/25/2023   Iron deficiency anemia due to chronic blood loss 11/14/2021   Acute on chronic systolic CHF (congestive heart failure) (HCC) 07/07/2021   COPD (chronic obstructive pulmonary disease) (HCC)    Hyponatremia    SOB (shortness of breath)    DNR (do not resuscitate)    Weakness generalized    Elevated LFTs    Pulmonary embolism (HCC) 06/11/2021   Acute on chronic heart failure (HCC) 06/02/2021   GERD without esophagitis 05/08/2021   Mixed hyperlipidemia 05/08/2021   Hypertensive urgency 05/08/2021   Elevated troponin level not due myocardial infarction 05/08/2021   Pure hypercholesterolemia 09/23/2020   Claudication in peripheral vascular  disease (HCC) 01/25/2020   Late latent syphilis 01/15/2020   Coronary artery disease involving native coronary artery of native heart 07/05/2019   Chronic kidney disease, stage 3a (HCC) 07/05/2019   Combined systolic and diastolic heart failure (HCC) 09/26/2018   Malignant neoplasm of prostate (HCC) 06/17/2018   Medication management    Dysphagia    Palliative care by specialist    Ischemic cardiomyopathy    Congestive dilated cardiomyopathy (HCC)    Acute on chronic systolic congestive heart failure (HCC)  01/02/2017   Malignant neoplasm of pyriform sinus (HCC) 09/28/2013   Piriform sinus tumor 09/24/2013   NONSPECIFIC ABN FINDING RAD & OTH EXAM GI TRACT 05/14/2009   PCP:  Pearline Cables, MD Pharmacy:   [id number] [place], [place] - [address] AT [facility] & [address] [place] [address] Phone: [phone] Fax: [phone]  [facility] - [place], [place] - [address] [place] [address] Phone: [phone] Fax: [phone]     Social Drivers of Health (SDOH) Social History: SDOH Screenings   Food Insecurity: No Food Insecurity (10/26/2023)  Housing: Low Risk  (10/26/2023)  Transportation Needs: No Transportation Needs (10/26/2023)  Utilities: Not At Risk (07/20/2023)  Alcohol Screen: Low Risk  (07/20/2023)  Depression (PHQ2-9): Low Risk  (07/20/2023)  Financial Resource Strain: Low Risk  (07/20/2023)  Physical Activity: Inactive (07/20/2023)  Social Connections: Moderately Integrated (10/26/2023)  Stress: No Stress Concern Present (07/20/2023)  Tobacco Use: Medium Risk (10/25/2023)  Health Literacy: Adequate Health Literacy (07/20/2023)   SDOH Interventions:     Readmission Risk Interventions    06/03/2021   12:51 PM  Readmission Risk Prevention Plan  Transportation Screening Complete  PCP or Specialist Appt within 5-7 Days Complete  Home Care Screening Complete  Medication Review (RN CM) Complete

## 2023-10-27 NOTE — Plan of Care (Signed)

## 2023-10-27 NOTE — Evaluation (Signed)
 Clinical/Bedside Swallow Evaluation Patient Details  Name: Collin Young MRN: [id number] Date of Birth: 1951/01/08  Today's Date: 10/27/2023 Time: SLP Start Time (ACUTE ONLY): 4098 SLP Stop Time (ACUTE ONLY): 0854 SLP Time Calculation (min) (ACUTE ONLY): 12 min  Past Medical History:  Past Medical History:  Diagnosis Date   Chronic combined systolic and diastolic CHF (congestive heart failure) (HCC)    followed by dr t. Duke Salvia   COPD (chronic obstructive pulmonary disease) (HCC)    Coronary artery disease cardiologist-- dr Chilton Si   per cardiac cath 01-05-2017  chronic total occlusion pLCx with collaterals, 99% severe calcified prox. to mid RCA, otherwise mild to moderate CAD (medically managed)   Esophageal cancer, stage IIIB [facility]) oncologist-  dr ennever/  dr moody   dx 2010  SCC Stage IIIB completed chemoradiation;  localized recurrent left piriform sinus 01/ 2015,  completed concurrent chemoradiatoin 04/ 2015   GERD (gastroesophageal reflux disease)    09-07-2018   no issues since Gtube removed 06/ 2019   History of alcohol abuse    quit 2001   History of cancer chemotherapy    2010;   2015   History of radiation therapy    10-19-2013 to  12-05-2013 pyriform sinus 69.96 Gy/38fx;   Radiation completed 2010 for esophageal cancer   History of seizure 2001   alcohol withdrawal   HOH (hard of hearing)    Hyperlipidemia    Hypertension    Iron deficiency anemia due to chronic blood loss 11/14/2021   Ischemic cardiomyopathy 12/2016   01-03-2017  echo,  ef 10-15%/   echo 05-06-2017 EF improved to 45-50%   Prostate cancer [facility]) urologist-  dr ottelin/  oncologist-- dr Kathrynn Running   dx 05-27-2018--- Stage T1c,  Gleason 4+3,  PSA 9.3--  scheduled for brachytherapy 09-23-2017   Renal cyst, left    Past Surgical History:  Past Surgical History:  Procedure Laterality Date   BIOPSY  12/18/2021   Procedure: BIOPSY;  Surgeon: Rachael Fee, MD;  Location: [facility];   Service: Gastroenterology;;   CARDIOVASCULAR STRESS TEST  10/09/09   normal nuclearr stress test, EF 57% Adolph Pollack)   CENTRAL LINE INSERTION  06/16/2021   Procedure: CENTRAL LINE INSERTION;  Surgeon: Dolores Patty, MD;  Location: [facility]VASIVE CV [facility];  Service: Cardiovascular;;   ESOPHAGOGASTRODUODENOSCOPY (EGD) WITH PROPOFOL N/A 12/18/2021   Procedure: ESOPHAGOGASTRODUODENOSCOPY (EGD) WITH PROPOFOL;  Surgeon: Rachael Fee, MD;  Location: [facility];  Service: Gastroenterology;  Laterality: N/A;   IR GASTROSTOMY TUBE MOD SED  01/13/2017   IR GASTROSTOMY TUBE REMOVAL  02/03/2018   IR PATIENT EVAL TECH 0-60 MINS  03/25/2017   IR REMOVAL TUN ACCESS W/ PORT W/O FL MOD SED  01/15/2017   IR REPLACE G-TUBE SIMPLE WO FLUORO  01/13/2018   LARYNGOSCOPY N/A 09/15/2013   Procedure: LARYNGOSCOPY;  Surgeon: Christia Reading, MD;  Location: [facility] OR;  Service: ENT;  Laterality: N/A;  direct laryngoscopy with biopsy and esophagoscopy   RADIOACTIVE SEED IMPLANT N/A 09/23/2018   Procedure: RADIOACTIVE SEED IMPLANT/BRACHYTHERAPY IMPLANT;  Surgeon: Ihor Gully, MD;  Location: [facility];  Service: Urology;  Laterality: N/A;   RIGHT HEART CATH N/A 06/16/2021   Procedure: RIGHT HEART CATH;  Surgeon: Dolores Patty, MD;  Location: [facility];  Service: Cardiovascular;  Laterality: N/A;   RIGHT/LEFT HEART CATH AND CORONARY ANGIOGRAPHY N/A 01/05/2017   Procedure: Right/Left Heart Cath and Coronary Angiography;  Surgeon: Kathleene Hazel, MD;  [facility]  INVASIVE CV LAB;  Service: Cardiovascular;  Laterality: N/A;   TRANSTHORACIC ECHOCARDIOGRAM  05/06/2017   ef 45-50%,  grade 1 diastolic dysfunction/  AV severe calcified non coronary cusp with moderate regurg. , no stenosis (valve area per echo 01-05-2017 1.08cm^2)/  mild MR, TR, and PR/  mild LAE   HPI:  Collin Young  is a 73 y.o. male, with history of ischemic cardiomyopathy, chronic combined systolic and diastolic CHF EF 40% on last  echocardiogram, COPD, CAD, GERD, history of esophageal cancer, hx of PEG in 2018, prostate cancer, remote history of alcohol abuse quit in 2001, chronic iron deficiency anemia, hypertension, dyslipidemia, CKD stage IIIa baseline creatinine around 1.4, history of PE on Eliquis. Per chart pt diagnosed with community-acquired pneumonia about 2 weeks ago. Admitted with SOB, orthopnea over the last 2 to 3 days, he presented to [facility] where he had a CT angiogram of the chest, and diagnosed with acute on chronic CHF. CT chest Atelectasis in the lung bases is likely compressive but could indicate pneumonia. MBS in 2018 severe oropharyngeal dysphagia, silent aspiration thin liquids but clear with vollitional cough, regular/thin liq recommended.    Assessment / Plan / Recommendation  Clinical Impression  Pt demonstrating signs of potential pharyngeal dysphagia in pt with history of esophageal cancer with radiation in 2015. His vocal quality is hoarse and high pitched which he says started several weeks ago when he had pna. He states when he eats and takes a sip of water he sometimes coughs. Oromotor abilities are functional and he has upper and lower dentures. There were no concerns with applesauce and mastication of solid texture. Thin liquids produced a consistent immediate and delayed cough concerning for decreased airway protection. Pt cued to tuck chin however he coughed immediately after. Palpation of neck feels somewhat hard/fibrotic? however he states his cancer was confined to esophagus and denied laryngeal/pharyngeal cancer. Recommend continue regular texture, nectar thick liquids, pill whole in applesauce (he takes in applesauce at home) and may have sips thin water only in between meals after oral care. MBS planned for 2/27. SLP Visit Diagnosis: Dysphagia, unspecified (R13.10)    Aspiration Risk  Mild aspiration risk    Diet Recommendation Regular;Nectar-thick liquid;Other (Comment) (may have thin  water in between meals)    Liquid Administration via: Straw;Cup Medication Administration: Whole meds with puree (takes meds in applesauce at home) Supervision: Patient able to self feed Compensations: Slow rate;Small sips/bites Postural Changes: Seated upright at 90 degrees;Remain upright for at least 30 minutes after po intake    Other  Recommendations Oral Care Recommendations: Oral care BID    Recommendations for follow up therapy are one component of a multi-disciplinary discharge planning process, led by the attending physician.  Recommendations may be updated based on patient status, additional functional criteria and insurance authorization.  Follow up Recommendations  (TBD)      Assistance Recommended at Discharge    Functional Status Assessment Patient has had a recent decline in their functional status and demonstrates the ability to make significant improvements in function in a reasonable and predictable amount of time.  Frequency and Duration min 2x/week  2 weeks       Prognosis Prognosis for improved oropharyngeal function: Good      Swallow Study   General Date of Onset: 10/27/23 HPI: Collin Young  is a 73 y.o. male, with history of ischemic cardiomyopathy, chronic combined systolic and diastolic CHF EF 10% on last echocardiogram, COPD, CAD, GERD, history of esophageal cancer,  hx of PEG in 2018, prostate cancer, remote history of alcohol abuse quit in 2001, chronic iron deficiency anemia, hypertension, dyslipidemia, CKD stage IIIa baseline creatinine around 1.4, history of PE on Eliquis. Per chart pt diagnosed with community-acquired pneumonia about 2 weeks ago. Admitted with SOB, orthopnea over the last 2 to 3 days, he presented to [facility] where he had a CT angiogram of the chest, and diagnosed with acute on chronic CHF. CT chest Atelectasis in the lung bases is likely compressive but could indicate pneumonia. MBS in 2018 severe oropharyngeal dysphagia, silent  aspiration thin liquids but clear with vollitional cough, regular/thin liq recommended. Type of Study: Bedside Swallow Evaluation Previous Swallow Assessment:  (see HPI) Diet Prior to this Study: Regular;Thin liquids (Level 0) Temperature Spikes Noted: No Respiratory Status: Room air History of Recent Intubation: No Behavior/Cognition: Alert;Cooperative;Pleasant mood Oral Cavity Assessment: Within Functional Limits Oral Care Completed by SLP: No Oral Cavity - Dentition: Dentures, top;Dentures, bottom Vision: Functional for self-feeding Self-Feeding Abilities: Able to feed self Patient Positioning: Upright in chair Baseline Vocal Quality: Hoarse Volitional Cough: Strong Volitional Swallow: Able to elicit    Oral/Motor/Sensory Function Overall Oral Motor/Sensory Function: Within functional limits   Ice Chips Ice chips: Not tested   Thin Liquid Thin Liquid: Impaired Presentation: Straw;Cup Oral Phase Impairments:  (none) Pharyngeal  Phase Impairments: Cough - Immediate;Cough - Delayed    Nectar Thick Nectar Thick Liquid: Not tested   Honey Thick Honey Thick Liquid: Not tested   Puree Puree: Within functional limits   Solid     Solid: Within functional limits      Collin Young 10/27/2023,9:18 AM

## 2023-10-27 NOTE — Progress Notes (Addendum)
 PROGRESS NOTE    STEPHONE GUM  JYN:[id number] DOB: [birth year] DOA: 10/25/2023 PCP: Pearline Cables, MD  72/M with chronic combined CHF, multivessel CAD on medical management, esophageal CA, remote EtOH abuse, CKD 3 AA, history of PE on Eliquis few weeks ago was diagnosed with pneumonia, treated with antibiotics, briefly improved subsequently started developing shortness of breath and orthopnea X 3 days presented to [facility]. CTA chest with no PE, moderate bilateral pleural effusions, interstitial edema   Subjective: -Feels much better, breathing is improving  Assessment and Plan:  Acute on chronic biventricular failure -Last echo 5/24 with EF 35-40%, inferior akinesis, mild MR -Mildly volume overloaded, continue IV Lasix today, Farxiga -Hold Entresto this a.m. with uptrending creatinine, restart when adequately diuresed  History of PE -Continue Eliquis, current CTA negative  AKI on CKD 3a -Uptrending creatinine noted, hold Entresto today  History of esophageal, left pyriform sinus CA -Completed chemo and XRT in 2010 for esophageal CA -Completed therapy in 2015 for pyriform sinus CA  Early stage prostate CA -Completed prostate brachytherapy -Followed by Dr. Myna Hidalgo  Chronic iron deficiency anemia Stable, add IV iron   DVT prophylaxis: Apixaban Code Status: DNR- Family Communication: none present,  Disposition Plan: Home likely 48h  Consultants:    Procedures:   Antimicrobials:    Objective: Vitals:   10/26/23 2245 10/26/23 2300 10/27/23 0458 10/27/23 0714  BP: 138/83  127/82 (!) 142/84  Pulse: (!) 105 (!) 102 (!) 101 (!) 103  Resp: 16  17 17   Temp: (!) 97.3 F (36.3 C)  97.8 F (36.6 C) 97.7 F (36.5 C)  TempSrc: Oral  Oral Oral  SpO2: 98% 93% 100% 100%  Weight:   59.3 kg   Height:        Intake/Output Summary (Last 24 hours) at 10/27/2023 1048 Last data filed at 10/27/2023 8657 Gross per 24 hour  Intake 480 ml  Output 1400 ml  Net -920  ml   Filed Weights   10/26/23 1756 10/27/23 0458  Weight: 60.1 kg 59.3 kg    Examination:  General exam: Elderly thinly built male sitting up in recliner HEENT: +JVD Respiratory system: Scattered rhonchi Cardiovascular system: S1 & S2 heard, RRR.  Abd: nondistended, soft and nontender.Normal bowel sounds heard. Central nervous system: Alert and oriented. No focal neurological deficits. Extremities: no edema Skin: No rashes Psychiatry:  Mood & affect appropriate.     Data Reviewed:   CBC: Recent Labs  Lab 10/25/23 1946 10/26/23 0449 10/27/23 0328  WBC 5.0 5.0 4.8  NEUTROABS 2.9 2.9 2.9  HGB 10.9* 10.0* 10.3*  HCT 38.4* 34.0* 35.1*  MCV 78.4* 75.6* 76.0*  PLT 248 243 250   Basic Metabolic Panel: Recent Labs  Lab 10/25/23 1954 10/25/23 2054 10/26/23 0449 10/27/23 0328  NA 129*  --  131* 133*  K 5.4* 5.4* 4.8 5.0  CL 95*  --  96* 94*  CO2 25  --  26 26  GLUCOSE 119*  --  110* 110*  BUN 25*  --  26* 30*  CREATININE 1.43*  --  1.45* 1.69*  CALCIUM 9.8  --  9.2 9.1  MG  --   --  2.3 2.3  PHOS  --   --   --  4.8*   GFR: Estimated Creatinine Clearance: 33.1 mL/min (A) (by C-G formula based on SCr of 1.69 mg/dL (H)). Liver Function Tests: Recent Labs  Lab 10/25/23 1954 10/27/23 0328  AST 100* 131*  ALT 284* 282*  ALKPHOS  58 58  BILITOT 0.4 0.5  PROT 8.3* 7.3  ALBUMIN 4.2 3.3*   No results for input(s): "LIPASE", "AMYLASE" in the last 168 hours. No results for input(s): "AMMONIA" in the last 168 hours. Coagulation Profile: Recent Labs  Lab 10/27/23 0328  INR 2.2*   Cardiac Enzymes: No results for input(s): "CKTOTAL", "CKMB", "CKMBINDEX", "TROPONINI" in the last 168 hours. BNP (last 3 results) No results for input(s): "PROBNP" in the last 8760 hours. HbA1C: No results for input(s): "HGBA1C" in the last 72 hours. CBG: No results for input(s): "GLUCAP" in the last 168 hours. Lipid Profile: No results for input(s): "CHOL", "HDL", "LDLCALC",  "TRIG", "CHOLHDL", "LDLDIRECT" in the last 72 hours. Thyroid Function Tests: Recent Labs    10/27/23 0328  TSH 5.003*   Anemia Panel: No results for input(s): "VITAMINB12", "FOLATE", "FERRITIN", "TIBC", "IRON", "RETICCTPCT" in the last 72 hours. Urine analysis:    Component Value Date/Time   COLORURINE YELLOW 05/07/2021 2345   APPEARANCEUR HAZY (A) 05/07/2021 2345   APPEARANCEUR Clear 05/29/2020 1556   LABSPEC 1.023 05/07/2021 2345   PHURINE 5.0 05/07/2021 2345   GLUCOSEU NEGATIVE 05/07/2021 2345   HGBUR NEGATIVE 05/07/2021 2345   BILIRUBINUR NEGATIVE 05/07/2021 2345   BILIRUBINUR Negative 05/29/2020 1556   KETONESUR NEGATIVE 05/07/2021 2345   PROTEINUR 100 (A) 05/07/2021 2345   UROBILINOGEN 1.0 08/04/2014 2007   NITRITE NEGATIVE 05/07/2021 2345   LEUKOCYTESUR TRACE (A) 05/07/2021 2345   Sepsis Labs: @LABRCNTIP (procalcitonin:4,lacticidven:4)  ) Recent Results (from the past 240 hours)  Resp panel by RT-PCR (RSV, Flu A&B, Covid) Anterior Nasal Swab     Status: None   Collection Time: 10/25/23  7:46 PM   Specimen: Anterior Nasal Swab  Result Value Ref Range Status   SARS Coronavirus 2 by RT PCR NEGATIVE NEGATIVE Final    Comment: (NOTE) SARS-CoV-2 target nucleic acids are NOT DETECTED.  The SARS-CoV-2 RNA is generally detectable in upper respiratory specimens during the acute phase of infection. The lowest concentration of SARS-CoV-2 viral copies this assay can detect is 138 copies/mL. A negative result does not preclude SARS-Cov-2 infection and should not be used as the sole basis for treatment or other patient management decisions. A negative result may occur with  improper specimen collection/handling, submission of specimen other than nasopharyngeal swab, presence of viral mutation(s) within the areas targeted by this assay, and inadequate number of viral copies(<138 copies/mL). A negative result must be combined with clinical observations, patient history, and  epidemiological information. The expected result is Negative.  Fact Sheet for Patients:  BloggerCourse.com  Fact Sheet for Healthcare Providers:  SeriousBroker.it  This test is no t yet approved or cleared by the [place] FDA and  has been authorized for detection and/or diagnosis of SARS-CoV-2 by FDA under an Emergency Use Authorization (EUA). This EUA will remain  in effect (meaning this test can be used) for the duration of the COVID-19 declaration under Section 564(b)(1) of the Act, 21 U.S.C.section 360bbb-3(b)(1), unless the authorization is terminated  or revoked sooner.       Influenza A by PCR NEGATIVE NEGATIVE Final   Influenza B by PCR NEGATIVE NEGATIVE Final    Comment: (NOTE) The Xpert Xpress SARS-CoV-2/FLU/RSV plus assay is intended as an aid in the diagnosis of influenza from Nasopharyngeal swab specimens and should not be used as a sole basis for treatment. Nasal washings and aspirates are unacceptable for Xpert Xpress SARS-CoV-2/FLU/RSV testing.  Fact Sheet for Patients: BloggerCourse.com  Fact Sheet for Healthcare Providers: SeriousBroker.it  This test is not yet approved or cleared by the [place] and has been authorized for detection and/or diagnosis of SARS-CoV-2 by FDA under an Emergency Use Authorization (EUA). This EUA will remain in effect (meaning this test can be used) for the duration of the COVID-19 declaration under Section 564(b)(1) of the Act, 21 U.S.C. section 360bbb-3(b)(1), unless the authorization is terminated or revoked.     Resp Syncytial Virus by PCR NEGATIVE NEGATIVE Final    Comment: (NOTE) Fact Sheet for Patients: BloggerCourse.com  Fact Sheet for Healthcare Providers: SeriousBroker.it  This test is not yet approved or cleared by the [place] FDA and has been  authorized for detection and/or diagnosis of SARS-CoV-2 by FDA under an Emergency Use Authorization (EUA). This EUA will remain in effect (meaning this test can be used) for the duration of the COVID-19 declaration under Section 564(b)(1) of the Act, 21 U.S.C. section 360bbb-3(b)(1), unless the authorization is terminated or revoked.  Performed at [facility], [address] [place], [place] [address]      Radiology Studies: CT Angio Chest PE W and/or Wo Contrast Result Date: 10/25/2023 CLINICAL DATA:  Pulmonary embolus suspected with high probability. Shortness of breath for 2 weeks. Recent diagnosis of pneumonia and finished antibiotics 2 weeks ago. EXAM: CT ANGIOGRAPHY CHEST WITH CONTRAST TECHNIQUE: Multidetector CT imaging of the chest was performed using the standard protocol during bolus administration of intravenous contrast. Multiplanar CT image reconstructions and MIPs were obtained to evaluate the vascular anatomy. RADIATION DOSE REDUCTION: This exam was performed according to the departmental dose-optimization program which includes automated exposure control, adjustment of the mA and/or kV according to patient size and/or use of iterative reconstruction technique. CONTRAST:  75mL OMNIPAQUE IOHEXOL 350 MG/ML SOLN COMPARISON:  Chest radiograph 10/25/2023.  CT chest 06/11/2021 FINDINGS: Cardiovascular: Technically adequate study with good opacification of the central and segmental pulmonary arteries. Basilar pulmonary arteries are not well opacified, likely due to early bolus technique. No definitive focal filling defects are demonstrated to suggest acute pulmonary embolus. Cardiac enlargement. Reflux of contrast material into the hepatic veins likely indicating right heart failure. No pericardial effusions. Normal caliber thoracic aorta. Calcification of the aorta and coronary arteries. Mediastinum/Nodes: Thyroid gland is unremarkable. Esophagus is decompressed. No  significant lymphadenopathy. Lungs/Pleura: Moderate bilateral pleural effusions with basilar atelectasis. Diffuse interstitial infiltrates in the lungs likely representing interstitial edema. No pneumothorax. Upper Abdomen: No acute abnormalities. Musculoskeletal: No acute bony abnormalities. Review of the MIP images confirms the above findings. IMPRESSION: 1. No definitive evidence of acute pulmonary embolus. Non opacification of the basilar pulmonary arteries is likely technical. 2. Cardiac enlargement with evidence of right heart failure. 3. Moderate bilateral pleural effusions. 4. Interstitial edema diffusely. 5. Atelectasis in the lung bases is likely compressive but could indicate pneumonia. 6. Aortic atherosclerosis. Electronically Signed   By: Burman Nieves M.D.   On: 10/25/2023 22:46   DG Chest Port 1 View Result Date: 10/25/2023 CLINICAL DATA:  Shortness of breath x2 weeks, history of CHF, recent pneumonia EXAM: PORTABLE CHEST 1 VIEW COMPARISON:  10/15/2023 FINDINGS: Mild interstitial edema, lower lobe predominant. Small bilateral pleural effusions, right greater than left. No pneumothorax. The heart is top-normal in size.  Thoracic aortic atherosclerosis. IMPRESSION: Mild interstitial edema. Small bilateral pleural effusions, right greater than left. Electronically Signed   By: Charline Bills M.D.   On: 10/25/2023 21:48     Scheduled Meds:  apixaban  5 mg Oral BID   aspirin EC  81  mg Oral Daily   dapagliflozin propanediol  10 mg Oral Daily   docusate sodium  200 mg Oral BID   fentaNYL  1 patch Transdermal Q72H   furosemide  40 mg Intravenous BID   ipratropium  2 spray Each Nare Q12H   pantoprazole  40 mg Oral Daily   pravastatin  80 mg Oral q1800   sacubitril-valsartan  1 tablet Oral BID   Continuous Infusions:   LOS: 1 day    Time spent:    Zannie Cove, MD [facility]   10/27/2023, 10:48 AM

## 2023-10-27 NOTE — Consult Note (Addendum)
 Advanced Heart Failure Team Consult Note   Primary Physician: Pearline Cables, MD Cardiologist:  Chilton Si, MD  Reason for Consultation: acute biventricular heart failure  HPI:    Collin Young is seen today for evaluation of acute on chronic systolic heart failure at the request of Dr Jomarie Longs.   Collin Young is a 73 y.o.male w/ chronic combined systolic and diastolic CHF/ ischemic CM, severe multivessel CAD treated medically, 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     HF dates back to 12/2016. Echo then w/ severely reduced LVEF 10-15%, RV mild-mod reduced, mod AI/MR/TR. LHC showed severe calcific disease in the proximal to mid RCA and chronic total occlusion of the proximal circumflex.There was consideration of RCA PCI with recommendations for medical management.    Admitted  9/22 with HF. Echo EF down 25-30%, Chest CT showed  large right pleural effusion.  There was questionable PE on his CT but ultimately this was thought to be just artifact with poor opacification.   Admitted 10/22 with HF and acute hypoxic respiratory failure.Complicated by PE, pleural effusion, and cardiogenic shock. Diuresed with IV lasix.  Limited Echo EF 20%, global HK, RV normal.  RHC cardiogenic shock with R>L heart failure. GDMT titrated. Not felt to be a VAD candidate due to cachexia and RV failure. Discharged home, weight 121 lbs.    Dry weight 122 lbs.   Last seen in AHF Clinic 5/24, had been doing well, without HF symptoms. Did not need Lasix at the time.   Seen by Oncology 10/05/23 for cough with hemoptysis and malaise. CXR performed that was suggestive of atypical pna vs edema. With s/s was thought to be more likely PNA and was treated with doxycycline and azithromycin.  Presented to [facility] and admitted 10/26/23 for cough thought to be CHF exacerbation given symptoms did not improve with treatment of PNA. Labs  notable for WBC, 5, hs-trop 114>120, Na 131, BUN/Cr 26/1.45, BNP 2130, procal <0.10. Vitals notable for BP 145/88, HR 103. He was given Lasix 40 mg IV and resumed on Eliquis. CTPE negative for acute PE. CXR with b/l pleural effusions. EKG ST 102 bpm. RVP negative. Heart Failure consulted.   He is sitting up in the chair, feeling fine this morning. 10lbs above dry weight. Has been eating okay but abdomen is more tight for him. + Productive cough. Weight down 2 lbs overnight.   Home Medications Prior to Admission medications   Medication Sig Start Date End Date Taking? Authorizing Provider  albuterol (VENTOLIN HFA) 108 (90 Base) MCG/ACT inhaler Inhale 2 puffs into the lungs every 6 (six) hours as needed for wheezing or shortness of breath. 10/06/22  Yes Copland, Gwenlyn Found, MD  BAYER LOW DOSE 81 MG EC tablet Take 81 mg by mouth daily. Swallow whole.   Yes [provider]  CORLANOR 5 MG TABS tablet TAKE 1/2 TABLET(2.5 MG) BY MOUTH TWICE DAILY WITH A MEAL 05/25/23  Yes Milford, Anderson Malta, FNP  dapagliflozin propanediol (FARXIGA) 10 MG TABS tablet Take 1 tablet (10 mg total) by mouth daily. 04/05/23  Yes Laiana Fratus, Bevelyn Buckles, MD  ELIQUIS 5 MG TABS tablet TAKE 1 TABLET(5 MG) BY MOUTH TWICE DAILY 09/07/23  Yes Zacari Radick, Bevelyn Buckles, MD  Evolocumab (REPATHA SURECLICK) 140 MG/ML SOAJ Inject 140 mg into the skin every 14 (fourteen) days. Please keep your upcoming appointment for refills. 02/01/23  Yes Alver Sorrow, NP  fentaNYL (DURAGESIC) 12 MCG/HR Place 1 patch onto the skin every 3 (three) days. 10/05/23  Yes Josph Macho, MD  furosemide (LASIX) 40 MG tablet Take 1 tablet (40 mg total) by mouth as needed. 12/31/21 10/26/23 Yes Conn Trombetta, Bevelyn Buckles, MD  ipratropium (ATROVENT) 0.03 % nasal spray Place 2 sprays into both nostrils every 12 (twelve) hours. 07/03/21  Yes Copland, Gwenlyn Found, MD  polyethylene glycol (MIRALAX / GLYCOLAX) 17 g packet Take 17 g by mouth daily as needed for mild constipation. 05/11/21   Yes Alwyn Ren, MD  pravastatin (PRAVACHOL) 80 MG tablet TAKE 1 TABLET(80 MG) BY MOUTH EVERY EVENING 08/09/23  Yes Copland, Gwenlyn Found, MD  sacubitril-valsartan (ENTRESTO) 24-26 MG Take 1 tablet by mouth 2 (two) times daily. 06/29/23  Yes Johanan Skorupski, Bevelyn Buckles, MD  spironolactone (ALDACTONE) 25 MG tablet Take 1 tablet (25 mg total) by mouth as needed. 01/18/23  Yes Ekam Bonebrake, Bevelyn Buckles, MD  azithromycin (ZITHROMAX Z-PAK) 250 MG tablet Take 2 tablets together with food on day 1. Then take one tablet daily for days 2-5. Patient not taking: Reported on 10/26/2023 10/05/23   Rushie Chestnut, PA-C  tadalafil (CIALIS) 20 MG tablet Take 0.5-1 tablets (10-20 mg total) by mouth every other day as needed for erectile dysfunction. 10/06/23   Copland, Gwenlyn Found, MD  umeclidinium bromide (INCRUSE ELLIPTA) 62.5 MCG/ACT AEPB Inhale 1 puff into the lungs daily. Patient not taking: Reported on 10/26/2023 09/18/22   Copland, Gwenlyn Found, MD    Past Medical History: Past Medical History:  Diagnosis Date   Chronic combined systolic and diastolic CHF (congestive heart failure) (HCC)    followed by dr t. [facility] Salvia   COPD (chronic obstructive pulmonary disease) [facility])    Coronary artery disease cardiologist-- dr Chilton Si   per cardiac cath 01-05-2017  chronic total occlusion pLCx with collaterals, 99% severe calcified prox. to mid RCA, otherwise mild to moderate CAD (medically managed)   Esophageal cancer, stage IIIB [facility]) oncologist-  dr ennever/  dr moody   dx 2010  SCC Stage IIIB completed chemoradiation;  localized recurrent left piriform sinus 01/ 2015,  completed concurrent chemoradiatoin 04/ 2015   GERD (gastroesophageal reflux disease)    09-07-2018   no issues since Gtube removed 06/ 2019   History of alcohol abuse    quit 2001   History of cancer chemotherapy    2010;   2015   History of radiation therapy    10-19-2013 to  12-05-2013 pyriform sinus 69.96 Gy/58fx;   Radiation completed 2010 for  esophageal cancer   History of seizure 2001   alcohol withdrawal   HOH (hard of hearing)    Hyperlipidemia    Hypertension    Iron deficiency anemia due to chronic blood loss 11/14/2021   Ischemic cardiomyopathy 12/2016   01-03-2017  echo,  ef 10-15%/   echo 05-06-2017 EF improved to 45-50%   Prostate cancer [facility]) urologist-  dr ottelin/  oncologist-- dr Kathrynn Running   dx 05-27-2018--- Stage T1c,  Gleason 4+3,  PSA 9.3--  scheduled for brachytherapy 09-23-2017   Renal cyst, left     Past Surgical History: Past Surgical History:  Procedure Laterality Date   BIOPSY  12/18/2021   Procedure: BIOPSY;  Surgeon: Rachael Fee, MD;  Location: [facility];  Service: Gastroenterology;;   CARDIOVASCULAR STRESS TEST  10/09/09   normal nuclearr stress test, EF 57% Adolph Pollack)   CENTRAL LINE INSERTION  06/16/2021   Procedure: CENTRAL LINE INSERTION;  Surgeon: Gala Romney,  Bevelyn Buckles, MD;  Location: [facility] INVASIVE CV LAB;  Service: Cardiovascular;;   ESOPHAGOGASTRODUODENOSCOPY (EGD) WITH PROPOFOL N/A 12/18/2021   Procedure: ESOPHAGOGASTRODUODENOSCOPY (EGD) WITH PROPOFOL;  Surgeon: Rachael Fee, MD;  Location: [facility] ENDOSCOPY;  Service: Gastroenterology;  Laterality: N/A;   IR GASTROSTOMY TUBE MOD SED  01/13/2017   IR GASTROSTOMY TUBE REMOVAL  02/03/2018   IR PATIENT EVAL TECH 0-60 MINS  03/25/2017   IR REMOVAL TUN ACCESS W/ PORT W/O FL MOD SED  01/15/2017   IR REPLACE G-TUBE SIMPLE WO FLUORO  01/13/2018   LARYNGOSCOPY N/A 09/15/2013   Procedure: LARYNGOSCOPY;  Surgeon: Christia Reading, MD;  [facility];  Service: ENT;  Laterality: N/A;  direct laryngoscopy with biopsy and esophagoscopy   RADIOACTIVE SEED IMPLANT N/A 09/23/2018   Procedure: RADIOACTIVE SEED IMPLANT/BRACHYTHERAPY IMPLANT;  Surgeon: Ihor Gully, MD;  Location: [facility];  Service: Urology;  Laterality: N/A;   RIGHT HEART CATH N/A 06/16/2021   Procedure: RIGHT HEART CATH;  Surgeon: Dolores Patty, MD;  Location: [facility] INVASIVE CV  LAB;  Service: Cardiovascular;  Laterality: N/A;   RIGHT/LEFT HEART CATH AND CORONARY ANGIOGRAPHY N/A 01/05/2017   Procedure: Right/Left Heart Cath and Coronary Angiography;  Surgeon: Kathleene Hazel, MD;  Location: [facility] INVASIVE CV LAB;  Service: Cardiovascular;  Laterality: N/A;   TRANSTHORACIC ECHOCARDIOGRAM  05/06/2017   ef 45-50%,  grade 1 diastolic dysfunction/  AV severe calcified non coronary cusp with moderate regurg. , no stenosis (valve area per echo 01-05-2017 1.08cm^2)/  mild MR, TR, and PR/  mild LAE    Family History: Family History  Problem Relation Age of Onset   Breast cancer Mother    Prostate cancer Neg Hx    Colon cancer Neg Hx    Pancreatic cancer Neg Hx     Social History: Social History   Socioeconomic History   Marital status: Legally Separated    Spouse name: Not on file   Number of children: 2   Years of education: Not on file   Highest education level: Not on file  Occupational History   Occupation: retired  Tobacco Use   Smoking status: Former    Current packs/day: 0.00    Average packs/day: 1 pack/day for 48.8 years (48.8 ttl pk-yrs)    Types: Cigarettes    Start date: 11/08/1960    Quit date: 08/31/2009    Years since quitting: 14.1   Smokeless tobacco: Former    Types: Chew    Quit date: 09/01/1999  Vaping Use   Vaping status: Never Used  Substance and Sexual Activity   Alcohol use: Not Currently    Alcohol/week: 0.0 standard drinks of alcohol    Comment: quit in 2001   Drug use: No    Comment: back in the day used cocaine,alcohol, marijuana   Sexual activity: Not Currently    Partners: Female    Birth control/protection: None  Other Topics Concern   Not on file  Social History Narrative   Not on file   Social Drivers of Health   Financial Resource Strain: Low Risk  (07/20/2023)   Overall Financial Resource Strain (CARDIA)    Difficulty of Paying Living Expenses: Not hard at all  Food Insecurity: No Food Insecurity (10/26/2023)    Hunger Vital Sign    Worried About Running Out of Food in the Last Year: Never true    Ran Out of Food in the Last Year: Never true  Transportation Needs: No Transportation Needs (10/26/2023)  PRAPARE - Administrator, Civil Service (Medical): No    Lack of Transportation (Non-Medical): No  Physical Activity: Inactive (07/20/2023)   Exercise Vital Sign    Days of Exercise per Week: 0 days    Minutes of Exercise per Session: 0 min  Stress: No Stress Concern Present (07/20/2023)   [employer] of Occupational Health - Occupational Stress Questionnaire    Feeling of Stress : Not at all  Social Connections: Moderately Integrated (10/26/2023)   Social Connection and Isolation Panel [NHANES]    Frequency of Communication with Friends and Family: More than three times a week    Frequency of Social Gatherings with Friends and Family: More than three times a week    Attends Religious Services: More than 4 times per year    Active Member of Clubs or Organizations: Yes    Attends Engineer, structural: More than 4 times per year    Marital Status: Divorced    Allergies:  No Known Allergies  Objective:    Vital Signs:   Temp:  [97.3 F (36.3 C)-98.7 F (37.1 C)] 97.7 F (36.5 C) (02/26 0714) Pulse Rate:  [99-105] 103 (02/26 0714) Resp:  [16-26] 17 (02/26 0714) BP: (127-152)/(82-106) 142/84 (02/26 0714) SpO2:  [90 %-100 %] 100 % (02/26 0714) Weight:  [59.3 kg-60.1 kg] 59.3 kg (02/26 0458) Last BM Date : 10/25/23  Weight change: Filed Weights   10/26/23 1756 10/27/23 0458  Weight: 60.1 kg 59.3 kg    Intake/Output:   Intake/Output Summary (Last 24 hours) at 10/27/2023 0909 Last data filed at 10/27/2023 0459 Gross per 24 hour  Intake 360 ml  Output 1400 ml  Net -1040 ml      Physical Exam    General: Frail, elderly appearing. Cardiac: +HJR. JVP to jaw. S1 and S2 present. No murmurs or rub. Resp: Course rhonci  Abdomen: Firm, non-tender,  distended.  Extremities: Warm and dry. No rash, cyanosis.  No peripheral edema.  Neuro: Alert and oriented x3. Affect pleasant. Weak  Telemetry   ST 100-110s (personally reviewed)  EKG    N/A  Labs   Basic Metabolic Panel: Recent Labs  Lab 10/25/23 1954 10/25/23 2054 10/26/23 0449 10/27/23 0328  NA 129*  --  131* 133*  K 5.4* 5.4* 4.8 5.0  CL 95*  --  96* 94*  CO2 25  --  26 26  GLUCOSE 119*  --  110* 110*  BUN 25*  --  26* 30*  CREATININE 1.43*  --  1.45* 1.69*  CALCIUM 9.8  --  9.2 9.1  MG  --   --  2.3 2.3  PHOS  --   --   --  4.8*    Liver Function Tests: Recent Labs  Lab 10/25/23 1954 10/27/23 0328  AST 100* 131*  ALT 284* 282*  ALKPHOS 58 58  BILITOT 0.4 0.5  PROT 8.3* 7.3  ALBUMIN 4.2 3.3*   No results for input(s): "LIPASE", "AMYLASE" in the last 168 hours. No results for input(s): "AMMONIA" in the last 168 hours.  CBC: Recent Labs  Lab 10/25/23 1946 10/26/23 0449 10/27/23 0328  WBC 5.0 5.0 4.8  NEUTROABS 2.9 2.9 2.9  HGB 10.9* 10.0* 10.3*  HCT 38.4* 34.0* 35.1*  MCV 78.4* 75.6* 76.0*  PLT 248 243 250    Cardiac Enzymes: No results for input(s): "CKTOTAL", "CKMB", "CKMBINDEX", "TROPONINI" in the last 168 hours.  BNP: BNP (last 3 results) Recent Labs    10/25/23 1945  10/27/23 0328  BNP 2,130.3* 3,497.7*    ProBNP (last 3 results) No results for input(s): "PROBNP" in the last 8760 hours.   CBG: No results for input(s): "GLUCAP" in the last 168 hours.  Coagulation Studies: Recent Labs    10/27/23 0328  LABPROT 24.6*  INR 2.2*     Imaging   No results found.   Medications:     Current Medications:  apixaban  5 mg Oral BID   aspirin EC  81 mg Oral Daily   docusate sodium  200 mg Oral BID   fentaNYL  1 patch Transdermal Q72H   furosemide  40 mg Intravenous Daily   ipratropium  2 spray Each Nare Q12H   ivabradine  2.5 mg Oral BID WC   pantoprazole  40 mg Oral Daily   pravastatin  80 mg Oral q1800    spironolactone  25 mg Oral Daily    Infusions:     Patient Profile   Collin Young is a 73 y.o.male w/ chronic combined systolic and diastolic CHF/ ischemic CM, severe multivessel CAD treated medically, 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    Assessment/Plan   1.  Acute on Chronic Biventricular Heart Failure  Predominantly RV Failure - Echo (2018): EF 10-15%, RV moderately reduced. LHC w/ MVCAD management medically - EF remained 20-30% from 2021 to 2023 when it improved to 40-45%.Echo (9/21): EF down again, 35-40%. RV normal  - Last echo 01/07/23 EF 35-40% inferior AK mild MR  - Volume up on exam, 10lb above last dry weight.  - Give Lasix 40 mg IV bid today, may need to increase if response is poor continue to follow UOP.  - Hold ivabradine 2.5 mg bid with acute exacerbation - Resume Entresto 24/26 mg bid  - Resume Farxiga 10 mg daily - Hold spiro with K of 5 and AKI - Repeat echo today - CTPE with enlargement of RA/RV likely predominantly RV failure.    2. Pulmonary Embolism  - Diagnosed 10/22. - Suspect incidental due HF. - CTPE negative - Continue Eliquis 5 bid   3. AKI on CKD Stage IIIa - Labs today   4. H/o esophageal CA and throat CA - s/p chemo and XRT. - speech eval and imaging today  5. Tranaminitis - in the setting of hepatic congestion - AST/ALT 131/282  Length of Stay: 1  [place] Lee, NP  10/27/2023, 9:09 AM  Advanced Heart Failure Team Pager [phone] (M-F; 7a - 5p)  Please contact [facility] for night-coverage after hours (4p -7a ) and weekends on [email or web address]  Patient seen and examined with the above-signed Advanced Practice Provider and/or Housestaff. I personally reviewed laboratory data, imaging studies and relevant notes. I independently examined the patient and formulated the important aspects of the plan. I have edited the note to reflect any of my changes or  salient points. I have personally discussed the plan with the patient and/or family.  73 y/o male with multiple medical problems well known to me me from the HF Clinic. He has advanced systolic HF with biventricualr dysfunction due to iCM with severe 3v CAD that has been managed medically., previous PE, h/o esophageal and throat CA. When I last saw him in Clinic several months ago was doing well EF 35-40% NYHA I-II.   Recently presented with cough and hemoptysis and treated for PNA but nt improve much.   Now admitted with pulmonary edema.  He is improving slowly with lasix. Denies CP. Hstrop and ecg ok   Echo EF 25-30% Personally reviewed  General:  Thin frail appearing. Hoarse voice  No resp difficulty HEENT: normal Neck: supple. JVP to jaw. Cor: PMI nondisplaced. Regular rate & rhythm. No rubs, gallops or murmurs. Lungs:+ crackles Abdomen: soft, nontender, nondistended. No hepatosplenomegaly. No bruits or masses. Good bowel sounds. Extremities: no cyanosis, clubbing, rash, 2+ edema Neuro: alert & orientedx3, cranial nerves grossly intact. moves all 4 extremities w/o difficulty. Affect pleasant  He has significant volume overload with progress LV dysfunction. Now improving with IV lasix. He has not had ACS but I remain concerned for progressive CAD. He is not having angina and is not CABG candidate. Will review films with IC team and consider if there is any role for repeat angiography.   Arvilla Meres, MD  9:55 PM

## 2023-10-27 NOTE — Progress Notes (Signed)
 Heart Failure Navigator Progress Note  Assessed for Heart & Vascular TOC clinic readiness.  Patient does not meet criteria due to Advanced Heart Failure Team consult. .   Navigator will sign off at this time.    Rhae Hammock, BSN, Scientist, clinical (histocompatibility and immunogenetics) Only

## 2023-10-27 NOTE — Evaluation (Signed)
 Physical Therapy Brief Evaluation and Discharge Note Patient Details Name: Collin Young MRN: [id number] DOB: 13-Oct-1950 Today's Date: 10/27/2023   History of Present Illness  Collin Young is a 73 y.o. male admitted 10/25/23 with dyspnea. Pt reports he was diagnosed with flu and pneumonia about 4 weeks ago, finished his antibiotics 2 weeks ago. Chest x-ray shows mild interstitial edema, with small bilateral pleural effusions, right greater than left, CTA shows moderate bilateral pleural effusions, with interstitial edema diffusely, there is also findings of atelectasis versus pneumonia. PMH of ischemic cardiomyopathy, chronic combined systolic and diastolic CHF EF, COPD, CAD, GERD, esophageal cancer, prostate cancer, remote history of alcohol abuse quit in 2001, chronic iron deficiency anemia, HTN, dyslipidemia, and CKD.   Clinical Impression  Pt admitted with above diagnosis. PTA, pt was independent with functional mobility without an AD, independent with all ADLs/IADLs, driving, and working in the concrete business. He performed all functional mobility independently without an AD and demonstrated good safety awareness. Pt appears to be at his baseline function, so no further acute PT needs. Pt feels ready and safe for d/c Home. I have answered all his questions related to mobility. Recommend OOB<>chair 3x/day and walking 2x/day while admitted.        PT Assessment Patient does not need any further PT services  Assistance Needed at Discharge  None    Equipment Recommendations None recommended by PT  Recommendations for Other Services       Precautions/Restrictions Precautions Precautions: Fall Recall of Precautions/Restrictions: Intact Restrictions Weight Bearing Restrictions Per Provider Order: No        Mobility  Bed Mobility       General bed mobility comments: Pt greeted in recliner chair. Not observed. Pt reported he got up from the bed earlier to use the bathroom and  shave without assistance.  Transfers Overall transfer level: Independent Equipment used: None               General transfer comment: STS from recliner chair. Good eccentric control.    Ambulation/Gait Ambulation/Gait assistance: Independent Gait Distance (Feet): 250 Feet Assistive device: None Gait Pattern/deviations: WFL(Within Functional Limits) Gait Speed: Pace WFL General Gait Details: Pt ambulated with a reciprocal gait pattern, good foot clearence, even weight shift. He is unable to dual task during ambulation. He was able to scan the environment and navigate obstacles.  Home Activity Instructions Home Activity Instructions: Encouraged pt to continue his walking program and continue using his pedal bike at home.  Stairs Stairs:  (Pt declined to practice step reporting he doesn't interact with them much.)          Modified Rankin (Stroke Patients Only)        Balance Overall balance assessment: No apparent balance deficits (not formally assessed)                        Pertinent Vitals/Pain PT - Brief Vital Signs All Vital Signs Stable: Yes Pain Assessment Pain Assessment: No/denies pain     Home Living Family/patient expects to be discharged to:: Private residence Living Arrangements: Alone Available Help at Discharge: Available PRN/intermittently;Friend(s) Home Environment: Other (comment);Rail - right (Pt has to go down a hill to get into his apartment)   Home Equipment: None        Prior Function Level of Independence: Independent Comments: Pt works in the concrete business and drives. He manages his own medications, finances, and household.    UE/LE Assessment   UE  ROM/Strength/Tone/Coordination: WFL    LE ROM/Strength/Tone/Coordination: [facility]      Communication   Communication Communication: Impaired Factors Affecting Communication: Hearing impaired     Cognition Overall Cognitive Status: Appears within functional limits  for tasks assessed/performed       General Comments General comments (skin integrity, edema, etc.): VSS on RA.    Exercises     Assessment/Plan    PT Problem List         PT Visit Diagnosis Other abnormalities of gait and mobility (R26.89)    No Skilled PT Patient is independent with all acitivity/mobility;Patient at baseline level of functioning;All education completed   Co-evaluation                AMPAC 6 Clicks Help needed turning from your back to your side while in a flat bed without using bedrails?: None Help needed moving from lying on your back to sitting on the side of a flat bed without using bedrails?: None Help needed moving to and from a bed to a chair (including a wheelchair)?: None Help needed standing up from a chair using your arms (e.g., wheelchair or bedside chair)?: None Help needed to walk in hospital room?: None Help needed climbing 3-5 steps with a railing? : None 6 Click Score: 24      End of Session Equipment Utilized During Treatment: Gait belt Activity Tolerance: Patient tolerated treatment well Patient left: in chair;with call bell/phone within reach Nurse Communication: Mobility status PT Visit Diagnosis: Other abnormalities of gait and mobility (R26.89)     Time: 1610-9604 PT Time Calculation (min) (ACUTE ONLY): 19 min  Charges:   PT Evaluation $PT Eval Low Complexity: 1 Low      Collin Young, PT, DPT Acute Rehabilitation Services Office: [phone] Secure Chat Preferred  Collin Young  10/27/2023, 10:11 AM

## 2023-10-28 ENCOUNTER — Inpatient Hospital Stay: Payer: 59

## 2023-10-28 ENCOUNTER — Inpatient Hospital Stay (HOSPITAL_COMMUNITY): Payer: 59

## 2023-10-28 DIAGNOSIS — I5043 Acute on chronic combined systolic (congestive) and diastolic (congestive) heart failure: Secondary | ICD-10-CM | POA: Diagnosis not present

## 2023-10-28 DIAGNOSIS — I5023 Acute on chronic systolic (congestive) heart failure: Secondary | ICD-10-CM | POA: Diagnosis not present

## 2023-10-28 LAB — CBC
HCT: 35.6 % — ABNORMAL LOW (ref 39.0–52.0)
Hemoglobin: 10.4 g/dL — ABNORMAL LOW (ref 13.0–17.0)
MCH: 22.1 pg — ABNORMAL LOW (ref 26.0–34.0)
MCHC: 29.2 g/dL — ABNORMAL LOW (ref 30.0–36.0)
MCV: 75.7 fL — ABNORMAL LOW (ref 80.0–100.0)
Platelets: 268 10*3/uL (ref 150–400)
RBC: 4.7 MIL/uL (ref 4.22–5.81)
RDW: 22.2 % — ABNORMAL HIGH (ref 11.5–15.5)
WBC: 5.2 10*3/uL (ref 4.0–10.5)
nRBC: 0 % (ref 0.0–0.2)

## 2023-10-28 LAB — BASIC METABOLIC PANEL
Anion gap: 15 (ref 5–15)
BUN: 34 mg/dL — ABNORMAL HIGH (ref 8–23)
CO2: 28 mmol/L (ref 22–32)
Calcium: 8.7 mg/dL — ABNORMAL LOW (ref 8.9–10.3)
Chloride: 89 mmol/L — ABNORMAL LOW (ref 98–111)
Creatinine, Ser: 1.44 mg/dL — ABNORMAL HIGH (ref 0.61–1.24)
GFR, Estimated: 52 mL/min — ABNORMAL LOW (ref >60)
Glucose, Bld: 149 mg/dL — ABNORMAL HIGH (ref 70–99)
Potassium: 4.1 mmol/L (ref 3.5–5.1)
Sodium: 132 mmol/L — ABNORMAL LOW (ref 135–145)

## 2023-10-28 MED ORDER — FUROSEMIDE 10 MG/ML IJ SOLN
80.0000 mg | Freq: Two times a day (BID) | INTRAMUSCULAR | Status: AC
  Administered 2023-10-28: 80 mg via INTRAVENOUS
  Filled 2023-10-28: qty 8

## 2023-10-28 MED ORDER — ENSURE ENLIVE PO LIQD
237.0000 mL | Freq: Two times a day (BID) | ORAL | Status: DC
Start: 2023-10-28 — End: 2023-10-29

## 2023-10-28 MED ORDER — POTASSIUM CHLORIDE CRYS ER 20 MEQ PO TBCR
20.0000 meq | EXTENDED_RELEASE_TABLET | Freq: Once | ORAL | Status: AC
  Administered 2023-10-28: 20 meq via ORAL
  Filled 2023-10-28: qty 1

## 2023-10-28 MED ORDER — SODIUM CHLORIDE 0.9 % IV SOLN
250.0000 mg | Freq: Every day | INTRAVENOUS | Status: AC
  Administered 2023-10-28 – 2023-10-29 (×2): 250 mg via INTRAVENOUS
  Filled 2023-10-28 (×2): qty 20

## 2023-10-28 MED ORDER — ADULT MULTIVITAMIN W/MINERALS CH
1.0000 | ORAL_TABLET | Freq: Every day | ORAL | Status: DC
  Administered 2023-10-28 – 2023-10-29 (×2): 1 via ORAL
  Filled 2023-10-28 (×2): qty 1

## 2023-10-28 MED ORDER — FUROSEMIDE 10 MG/ML IJ SOLN
40.0000 mg | Freq: Once | INTRAMUSCULAR | Status: AC
  Administered 2023-10-28: 40 mg via INTRAVENOUS
  Filled 2023-10-28: qty 4

## 2023-10-28 NOTE — TOC Progression Note (Addendum)
 Transition of Care [facility]) - Progression Note    Patient Details  Name: Collin Young MRN: [id number] Date of Birth: Oct 21, 1950  [facility] CM/SW Contact  Nicanor Bake Phone Number: [phone] 10/28/2023, 11:50 AM  Clinical Narrative:  HF CSW reviewed the pts AVS and his hospital follow up appointment has been scheduled by the NCM for Thursday, November 04, 2023 at 10:20 AM.   TOC will continue following.      Expected Discharge Plan: Home/Self Care Barriers to Discharge: Continued Medical Work up  Expected Discharge Plan and Services       Living arrangements for the past 2 months: Apartment                                       Social Determinants of Health (SDOH) Interventions SDOH Screenings   Food Insecurity: No Food Insecurity (10/26/2023)  Housing: Low Risk  (10/26/2023)  Transportation Needs: No Transportation Needs (10/26/2023)  Utilities: Not At Risk (07/20/2023)  Alcohol Screen: Low Risk  (07/20/2023)  Depression (PHQ2-9): Low Risk  (07/20/2023)  Financial Resource Strain: Low Risk  (07/20/2023)  Physical Activity: Inactive (07/20/2023)  Social Connections: Moderately Integrated (10/26/2023)  Stress: No Stress Concern Present (07/20/2023)  Tobacco Use: Medium Risk (10/25/2023)  Health Literacy: Adequate Health Literacy (07/20/2023)    Readmission Risk Interventions    06/03/2021   12:51 PM  Readmission Risk Prevention Plan  Transportation Screening Complete  PCP or Specialist Appt within 5-7 Days Complete  Home Care Screening Complete  Medication Review (RN CM) Complete

## 2023-10-28 NOTE — Progress Notes (Signed)
 PROGRESS NOTE    Collin Young  TDV:[id number] DOB: Mar 09, 1951 DOA: 10/25/2023 PCP: Pearline Cables, MD  73/M with chronic combined CHF, multivessel CAD on medical management, esophageal CA, remote EtOH abuse, CKD 3 AA, history of PE on Eliquis few weeks ago was diagnosed with pneumonia, treated with antibiotics, briefly improved subsequently started developing shortness of breath and orthopnea X 3 days presented to [facility]. CTA chest with no PE, moderate bilateral pleural effusions, interstitial edema   Subjective: -Feels much better, breathing is improving  Assessment and Plan:  Acute on chronic biventricular failure -Last echo 5/24 with EF 35-40%, inferior akinesis, mild MR -repeat ECHO with EF 25-30%, mod reduced RV and WMA -Continue IV Lasix today, creatinine improving, continue [employer]  -Cards following, reviewed previous cath films, doubtful of targets for PCI, no plan to pursue LHC at this time  History of PE -Continue Eliquis, current CTA negative  AKI on CKD 3a -Improving, monitor  History of esophageal, left pyriform sinus CA -Completed chemo and XRT in 2010 for esophageal CA -Completed therapy in 2015 for pyriform sinus CA -SLP eval completed, mild aspiration risk noted, regular diet with nectar thick liquids  Early stage prostate CA -Completed prostate brachytherapy -Followed by Dr. Myna Hidalgo  Chronic iron deficiency anemia Stable, add IV iron x2 days   DVT prophylaxis: Apixaban Code Status: DNR- Family Communication: none present,  Disposition Plan: Home likely 48h  Consultants:    Procedures:   Antimicrobials:    Objective: Vitals:   10/27/23 2006 10/28/23 0024 10/28/23 0420 10/28/23 0829  BP: 121/74 (!) 153/80 (!) 135/91 100/63  Pulse: 97 99 93 (!) 101  Resp: 16 17 17 14   Temp: 97.7 F (36.5 C) 98.2 F (36.8 C) 97.9 F (36.6 C) 97.7 F (36.5 C)  TempSrc: Oral Oral Oral Oral  SpO2: 96% 97% 96% 97%  Weight:   58.3 kg    Height:        Intake/Output Summary (Last 24 hours) at 10/28/2023 1148 Last data filed at 10/28/2023 0800 Gross per 24 hour  Intake 840 ml  Output 1350 ml  Net -510 ml   Filed Weights   10/26/23 1756 10/27/23 0458 10/28/23 0420  Weight: 60.1 kg 59.3 kg 58.3 kg    Examination:  General exam: Elderly thinly built male sitting up in the recliner, AAOx3 HEENT: Positive JVD CVS: S1-S2, regular rhythm Lungs: Few scattered rhonchi otherwise clear Abdomen: Soft, nontender, bowel sounds present Extremities: No edema  Skin: No rashes Psychiatry:  Mood & affect appropriate.     Data Reviewed:   CBC: Recent Labs  Lab 10/25/23 1946 10/26/23 0449 10/27/23 0328 10/28/23 0257  WBC 5.0 5.0 4.8 5.2  NEUTROABS 2.9 2.9 2.9  --   HGB 10.9* 10.0* 10.3* 10.4*  HCT 38.4* 34.0* 35.1* 35.6*  MCV 78.4* 75.6* 76.0* 75.7*  PLT 248 243 250 268   Basic Metabolic Panel: Recent Labs  Lab 10/25/23 1954 10/25/23 2054 10/26/23 0449 10/27/23 0328 10/28/23 0257  NA 129*  --  131* 133* 132*  K 5.4* 5.4* 4.8 5.0 4.1  CL 95*  --  96* 94* 89*  CO2 25  --  26 26 28   GLUCOSE 119*  --  110* 110* 149*  BUN 25*  --  26* 30* 34*  CREATININE 1.43*  --  1.45* 1.69* 1.44*  CALCIUM 9.8  --  9.2 9.1 8.7*  MG  --   --  2.3 2.3  --   PHOS  --   --   --  4.8*  --    GFR: Estimated Creatinine Clearance: 38.2 mL/min (A) (by C-G formula based on SCr of 1.44 mg/dL (H)). Liver Function Tests: Recent Labs  Lab 10/25/23 1954 10/27/23 0328  AST 100* 131*  ALT 284* 282*  ALKPHOS 58 58  BILITOT 0.4 0.5  PROT 8.3* 7.3  ALBUMIN 4.2 3.3*   No results for input(s): "LIPASE", "AMYLASE" in the last 168 hours. No results for input(s): "AMMONIA" in the last 168 hours. Coagulation Profile: Recent Labs  Lab 10/27/23 0328  INR 2.2*   Cardiac Enzymes: No results for input(s): "CKTOTAL", "CKMB", "CKMBINDEX", "TROPONINI" in the last 168 hours. BNP (last 3 results) No results for input(s): "PROBNP" in the  last 8760 hours. HbA1C: No results for input(s): "HGBA1C" in the last 72 hours. CBG: No results for input(s): "GLUCAP" in the last 168 hours. Lipid Profile: No results for input(s): "CHOL", "HDL", "LDLCALC", "TRIG", "CHOLHDL", "LDLDIRECT" in the last 72 hours. Thyroid Function Tests: Recent Labs    10/27/23 0328  TSH 5.003*   Anemia Panel: No results for input(s): "VITAMINB12", "FOLATE", "FERRITIN", "TIBC", "IRON", "RETICCTPCT" in the last 72 hours. Urine analysis:    Component Value Date/Time   COLORURINE YELLOW 05/07/2021 2345   APPEARANCEUR HAZY (A) 05/07/2021 2345   APPEARANCEUR Clear 05/29/2020 1556   LABSPEC 1.023 05/07/2021 2345   PHURINE 5.0 05/07/2021 2345   GLUCOSEU NEGATIVE 05/07/2021 2345   HGBUR NEGATIVE 05/07/2021 2345   BILIRUBINUR NEGATIVE 05/07/2021 2345   BILIRUBINUR Negative 05/29/2020 1556   KETONESUR NEGATIVE 05/07/2021 2345   PROTEINUR 100 (A) 05/07/2021 2345   UROBILINOGEN 1.0 08/04/2014 2007   NITRITE NEGATIVE 05/07/2021 2345   LEUKOCYTESUR TRACE (A) 05/07/2021 2345   Sepsis Labs: @LABRCNTIP (procalcitonin:4,lacticidven:4)  ) Recent Results (from the past 240 hours)  Resp panel by RT-PCR (RSV, Flu A&B, Covid) Anterior Nasal Swab     Status: None   Collection Time: 10/25/23  7:46 PM   Specimen: Anterior Nasal Swab  Result Value Ref Range Status   SARS Coronavirus 2 by RT PCR NEGATIVE NEGATIVE Final    Comment: (NOTE) SARS-CoV-2 target nucleic acids are NOT DETECTED.  The SARS-CoV-2 RNA is generally detectable in upper respiratory specimens during the acute phase of infection. The lowest concentration of SARS-CoV-2 viral copies this assay can detect is 138 copies/mL. A negative result does not preclude SARS-Cov-2 infection and should not be used as the sole basis for treatment or other patient management decisions. A negative result may occur with  improper specimen collection/handling, submission of specimen other than nasopharyngeal swab,  presence of viral mutation(s) within the areas targeted by this assay, and inadequate number of viral copies(<138 copies/mL). A negative result must be combined with clinical observations, patient history, and epidemiological information. The expected result is Negative.  Fact Sheet for Patients:  BloggerCourse.com  Fact Sheet for Healthcare Providers:  SeriousBroker.it  This test is no t yet approved or cleared by the [place] FDA and  has been authorized for detection and/or diagnosis of SARS-CoV-2 by FDA under an Emergency Use Authorization (EUA). This EUA will remain  in effect (meaning this test can be used) for the duration of the COVID-19 declaration under Section 564(b)(1) of the Act, 21 U.S.C.section 360bbb-3(b)(1), unless the authorization is terminated  or revoked sooner.       Influenza A by PCR NEGATIVE NEGATIVE Final   Influenza B by PCR NEGATIVE NEGATIVE Final    Comment: (NOTE) The Xpert Xpress SARS-CoV-2/FLU/RSV plus assay is intended as an aid  in the diagnosis of influenza from Nasopharyngeal swab specimens and should not be used as a sole basis for treatment. Nasal washings and aspirates are unacceptable for Xpert Xpress SARS-CoV-2/FLU/RSV testing.  Fact Sheet for Patients: BloggerCourse.com  Fact Sheet for Healthcare Providers: SeriousBroker.it  This test is not yet approved or cleared by the [place] FDA and has been authorized for detection and/or diagnosis of SARS-CoV-2 by FDA under an Emergency Use Authorization (EUA). This EUA will remain in effect (meaning this test can be used) for the duration of the COVID-19 declaration under Section 564(b)(1) of the Act, 21 U.S.C. section 360bbb-3(b)(1), unless the authorization is terminated or revoked.     Resp Syncytial Virus by PCR NEGATIVE NEGATIVE Final    Comment: (NOTE) Fact Sheet for  Patients: BloggerCourse.com  Fact Sheet for Healthcare Providers: SeriousBroker.it  This test is not yet approved or cleared by the [place] FDA and has been authorized for detection and/or diagnosis of SARS-CoV-2 by FDA under an Emergency Use Authorization (EUA). This EUA will remain in effect (meaning this test can be used) for the duration of the COVID-19 declaration under Section 564(b)(1) of the Act, 21 U.S.C. section 360bbb-3(b)(1), unless the authorization is terminated or revoked.  Performed at [facility], [address], [place], [place] [address]      Radiology Studies: ECHOCARDIOGRAM COMPLETE Result Date: 10/27/2023    ECHOCARDIOGRAM REPORT   Patient Name:   Collin Young Date of Exam: 10/27/2023 Medical Rec #:  [id number]        Height:       66.0 in Accession #:    [id number]       Weight:       130.7 lb Date of Birth:  Jan 13, 1951        BSA:          1.669 m Patient Age:    72 years         BP:           142/84 mmHg Patient Gender: M                HR:           103 bpm. Exam Location:  Inpatient Procedure: 2D Echo, Color Doppler, Cardiac Doppler and Intracardiac            Opacification Agent (Both Spectral and Color Flow Doppler were            utilized during procedure). Indications:    CHF  History:        Patient has prior history of Echocardiogram examinations, most                 recent 01/08/2023. Cardiomyopathy, CAD; COPD.  Sonographer:    Amy Chionchio Referring Phys: [id number] [place] LEE IMPRESSIONS  1. Left ventricular ejection fraction, by estimation, is 25 to 30%. The left ventricle has severely decreased function. The left ventricle demonstrates regional wall motion abnormalities (see scoring diagram/findings for description). Left ventricular diastolic parameters are indeterminate.  2. Right ventricular systolic function is moderately reduced. The right ventricular size is normal. There is  moderately elevated pulmonary artery systolic pressure. The estimated right ventricular systolic pressure is 47.5 mmHg.  3. Right atrial size was moderately dilated.  4. The mitral valve is abnormal. Mild mitral valve regurgitation. No evidence of mitral stenosis.  5. Tricuspid valve regurgitation is moderate.  6. The aortic valve is tricuspid. There is mild calcification of the aortic valve. Aortic valve  regurgitation is moderate. Aortic valve sclerosis/calcification is present, without any evidence of aortic stenosis. Aortic regurgitation PHT measures 501 msec. Aortic valve area, by VTI measures 2.04 cm. Aortic valve mean gradient measures 5.0 mmHg. Aortic valve Vmax measures 1.56 m/s.  7. The inferior vena cava is dilated in size with <50% respiratory variability, suggesting right atrial pressure of 15 mmHg. FINDINGS  Left Ventricle: Left ventricular ejection fraction, by estimation, is 25 to 30%. The left ventricle has severely decreased function. The left ventricle demonstrates regional wall motion abnormalities. Definity contrast agent was given IV to delineate the left ventricular endocardial borders. Strain imaging was not performed. The left ventricular internal cavity size was normal in size. There is no left ventricular hypertrophy. Left ventricular diastolic parameters are indeterminate.  LV Wall Scoring: The inferior septum, entire inferior wall, posterior wall, and apex are akinetic. The antero-lateral wall and apical lateral segment are hypokinetic. Right Ventricle: The right ventricular size is normal. No increase in right ventricular wall thickness. Right ventricular systolic function is moderately reduced. There is moderately elevated pulmonary artery systolic pressure. The tricuspid regurgitant velocity is 2.85 m/s, and with an assumed right atrial pressure of 15 mmHg, the estimated right ventricular systolic pressure is 47.5 mmHg. Left Atrium: Left atrial size was normal in size. Right Atrium:  Right atrial size was moderately dilated. Pericardium: There is no evidence of pericardial effusion. Mitral Valve: The mitral valve is abnormal. There is moderate thickening of the mitral valve leaflet(s). Mild mitral valve regurgitation. No evidence of mitral valve stenosis. MV peak gradient, 2.9 mmHg. The mean mitral valve gradient is 2.0 mmHg. Tricuspid Valve: The tricuspid valve is normal in structure. Tricuspid valve regurgitation is moderate . No evidence of tricuspid stenosis. Aortic Valve: The aortic valve is tricuspid. There is mild calcification of the aortic valve. Aortic valve regurgitation is moderate. Aortic regurgitation PHT measures 501 msec. Aortic valve sclerosis/calcification is present, without any evidence of aortic stenosis. Aortic valve mean gradient measures 5.0 mmHg. Aortic valve peak gradient measures 9.7 mmHg. Aortic valve area, by VTI measures 2.04 cm. Pulmonic Valve: The pulmonic valve was normal in structure. Pulmonic valve regurgitation is mild. No evidence of pulmonic stenosis. Aorta: The aortic root is normal in size and structure. Venous: The inferior vena cava is dilated in size with less than 50% respiratory variability, suggesting right atrial pressure of 15 mmHg. IAS/Shunts: No atrial level shunt detected by color flow Doppler. Additional Comments: 3D imaging was not performed.  LEFT VENTRICLE PLAX 2D LVIDd:         5.30 cm      Diastology LVIDs:         5.10 cm      LV e' lateral:   11.10 cm/s LV PW:         0.80 cm      LV E/e' lateral: 7.2 LV IVS:        0.90 cm LVOT diam:     2.20 cm LV SV:         45 LV SV Index:   27 LVOT Area:     3.80 cm  LV Volumes (MOD) LV vol d, MOD A2C: 173.0 ml LV vol d, MOD A4C: 152.0 ml LV vol s, MOD A2C: 126.0 ml LV vol s, MOD A4C: 130.0 ml LV SV MOD A2C:     47.0 ml LV SV MOD A4C:     152.0 ml LV SV MOD BP:      35.5 ml RIGHT VENTRICLE  IVC RV Basal diam:  3.90 cm    IVC diam: 2.20 cm RV S prime:     6.31 cm/s TAPSE (M-mode): 1.0 cm  LEFT ATRIUM             Index        RIGHT ATRIUM           Index LA Vol (A2C):   76.7 ml 45.95 ml/m  RA Area:     23.90 cm LA Vol (A4C):   46.4 ml 27.80 ml/m  RA Volume:   86.80 ml  52.00 ml/m LA Biplane Vol: 61.7 ml 36.96 ml/m  AORTIC VALVE                     PULMONIC VALVE AV Area (Vmax):    2.03 cm      PV Vmax:          0.61 m/s AV Area (Vmean):   2.04 cm      PV Peak grad:     1.5 mmHg AV Area (VTI):     2.04 cm      PR End Diast Vel: 11.02 msec AV Vmax:           156.00 cm/s AV Vmean:          102.000 cm/s AV VTI:            0.220 m AV Peak Grad:      9.7 mmHg AV Mean Grad:      5.0 mmHg LVOT Vmax:         83.30 cm/s LVOT Vmean:        54.800 cm/s LVOT VTI:          0.118 m LVOT/AV VTI ratio: 0.54 AI PHT:            501 msec  AORTA Ao Root diam: 2.80 cm Ao Asc diam:  3.30 cm MITRAL VALVE               TRICUSPID VALVE MV Area (PHT): 4.89 cm    TR Peak grad:   32.5 mmHg MV Area VTI:   1.86 cm    TR Vmax:        285.00 cm/s MV Peak grad:  2.9 mmHg MV Mean grad:  2.0 mmHg    SHUNTS MV Vmax:       0.86 m/s    Systemic VTI:  0.12 m MV Vmean:      68.0 cm/s   Systemic Diam: 2.20 cm MV E velocity: 79.90 cm/s MV A velocity: 70.00 cm/s MV E/A ratio:  1.14 Arvilla Meres MD Electronically signed by Arvilla Meres MD Signature Date/Time: 10/27/2023/3:20:16 PM    Final      Scheduled Meds:  apixaban  5 mg Oral BID   dapagliflozin propanediol  10 mg Oral Daily   docusate sodium  200 mg Oral BID   fentaNYL  1 patch Transdermal Q72H   furosemide  80 mg Intravenous BID   ipratropium  2 spray Each Nare Q12H   pantoprazole  40 mg Oral Daily   pravastatin  80 mg Oral q1800   sacubitril-valsartan  1 tablet Oral BID   Continuous Infusions:   LOS: 2 days    Time spent:    Zannie Cove, MD [facility]   10/28/2023, 11:48 AM

## 2023-10-28 NOTE — Progress Notes (Signed)
 Mobility Specialist Progress Note:   10/28/23 0932  Mobility  Activity Ambulated independently in hallway  Level of Assistance Independent  Assistive Device None  Distance Ambulated (ft) 500 ft  Activity Response Tolerated well  Mobility Referral Yes  Mobility visit 1 Mobility  Mobility Specialist Start Time (ACUTE ONLY) 0932  Mobility Specialist Stop Time (ACUTE ONLY) 0942  Mobility Specialist Time Calculation (min) (ACUTE ONLY) 10 min   Pt agreeable to mobility session. Required no physical assistance throughout ambulation. Denies SOB. SpO2 94% on RA. Pt back in chair with all needs met.  Addison Lank Mobility Specialist Please contact via SecureChat or  Rehab office at [phone]

## 2023-10-28 NOTE — Progress Notes (Addendum)
 Advanced Heart Failure Rounding Note  Cardiologist: Chilton Si, MD  Chief Complaint: Heart Failure Subjective:   2/26 Diuresed with IV lasix . GDMT added. Echo EF 25-30%  RV moderately reduced. Regional WMA.   Denies chest pain.    Objective:   Weight Range: 58.3 kg Body mass index is 20.74 kg/m.   Vital Signs:   Temp:  [97.7 F (36.5 C)-98.2 F (36.8 C)] 97.9 F (36.6 C) (02/27 0420) Pulse Rate:  [93-99] 93 (02/27 0420) Resp:  [16-17] 17 (02/27 0420) BP: (121-153)/(74-91) 135/91 (02/27 0420) SpO2:  [96 %-99 %] 96 % (02/27 0420) Weight:  [58.3 kg] 58.3 kg (02/27 0420) Last BM Date : 10/28/23  Weight change: Filed Weights   10/26/23 1756 10/27/23 0458 10/28/23 0420  Weight: 60.1 kg 59.3 kg 58.3 kg    Intake/Output:   Intake/Output Summary (Last 24 hours) at 10/28/2023 0832 Last data filed at 10/28/2023 0300 Gross per 24 hour  Intake 480 ml  Output 1350 ml  Net -870 ml      Physical Exam    General: HOH  NAD . Sitting in the chair Neck: Supple. JVP 9-10 . Carotids 2+ bilat; no bruits. No lymphadenopathy or thyromegaly appreciated. Cor: PMI nondisplaced. Regular rate & rhythm. No rubs, gallops or murmurs. Lungs: IW with crackles in the bases Abdomen: Soft, nontender, distended.  Extremities: No cyanosis, clubbing, rash, edema Neuro: Alert & orientedx3, cranial nerves grossly intact. moves all 4 extremities w/o difficulty. Affect pleasant   Telemetry   SR 90s   EKG    N/A  Labs    CBC Recent Labs    10/26/23 0449 10/27/23 0328 10/28/23 0257  WBC 5.0 4.8 5.2  NEUTROABS 2.9 2.9  --   HGB 10.0* 10.3* 10.4*  HCT 34.0* 35.1* 35.6*  MCV 75.6* 76.0* 75.7*  PLT 243 250 268   Basic Metabolic Panel Recent Labs    16/10/96 0449 10/27/23 0328 10/28/23 0257  NA 131* 133* 132*  K 4.8 5.0 4.1  CL 96* 94* 89*  CO2 26 26 28   GLUCOSE 110* 110* 149*  BUN 26* 30* 34*  CREATININE 1.45* 1.69* 1.44*  CALCIUM 9.2 9.1 8.7*  MG 2.3 2.3  --    PHOS  --  4.8*  --    Liver Function Tests Recent Labs    10/25/23 1954 10/27/23 0328  AST 100* 131*  ALT 284* 282*  ALKPHOS 58 58  BILITOT 0.4 0.5  PROT 8.3* 7.3  ALBUMIN 4.2 3.3*   No results for input(s): "LIPASE", "AMYLASE" in the last 72 hours. Cardiac Enzymes No results for input(s): "CKTOTAL", "CKMB", "CKMBINDEX", "TROPONINI" in the last 72 hours.  BNP: BNP (last 3 results) Recent Labs    10/25/23 1945 10/27/23 0328  BNP 2,130.3* 3,497.7*    ProBNP (last 3 results) No results for input(s): "PROBNP" in the last 8760 hours.   D-Dimer No results for input(s): "DDIMER" in the last 72 hours. Hemoglobin A1C No results for input(s): "HGBA1C" in the last 72 hours. Fasting Lipid Panel No results for input(s): "CHOL", "HDL", "LDLCALC", "TRIG", "CHOLHDL", "LDLDIRECT" in the last 72 hours. Thyroid Function Tests Recent Labs    10/27/23 0328  TSH 5.003*    Other results:   Imaging    ECHOCARDIOGRAM COMPLETE Result Date: 10/27/2023    ECHOCARDIOGRAM REPORT   Patient Name:   JALAN FARISS Date of Exam: 10/27/2023 Medical Rec #:  [id number]        Height:  66.0 in Accession #:    [id number]       Weight:       130.7 lb Date of Birth:  Sep 19, 1950        BSA:          1.669 m Patient Age:    73 years         BP:           142/84 mmHg Patient Gender: M                HR:           103 bpm. Exam Location:  Inpatient Procedure: 2D Echo, Color Doppler, Cardiac Doppler and Intracardiac            Opacification Agent (Both Spectral and Color Flow Doppler were            utilized during procedure). Indications:    CHF  History:        Patient has prior history of Echocardiogram examinations, most                 recent 01/08/2023. Cardiomyopathy, CAD; COPD.  Sonographer:    Amy Chionchio Referring Phys: [id number] [place] LEE IMPRESSIONS  1. Left ventricular ejection fraction, by estimation, is 25 to 30%. The left ventricle has severely decreased function. The left ventricle  demonstrates regional wall motion abnormalities (see scoring diagram/findings for description). Left ventricular diastolic parameters are indeterminate.  2. Right ventricular systolic function is moderately reduced. The right ventricular size is normal. There is moderately elevated pulmonary artery systolic pressure. The estimated right ventricular systolic pressure is 47.5 mmHg.  3. Right atrial size was moderately dilated.  4. The mitral valve is abnormal. Mild mitral valve regurgitation. No evidence of mitral stenosis.  5. Tricuspid valve regurgitation is moderate.  6. The aortic valve is tricuspid. There is mild calcification of the aortic valve. Aortic valve regurgitation is moderate. Aortic valve sclerosis/calcification is present, without any evidence of aortic stenosis. Aortic regurgitation PHT measures 501 msec. Aortic valve area, by VTI measures 2.04 cm. Aortic valve mean gradient measures 5.0 mmHg. Aortic valve Vmax measures 1.56 m/s.  7. The inferior vena cava is dilated in size with <50% respiratory variability, suggesting right atrial pressure of 15 mmHg. FINDINGS  Left Ventricle: Left ventricular ejection fraction, by estimation, is 25 to 30%. The left ventricle has severely decreased function. The left ventricle demonstrates regional wall motion abnormalities. Definity contrast agent was given IV to delineate the left ventricular endocardial borders. Strain imaging was not performed. The left ventricular internal cavity size was normal in size. There is no left ventricular hypertrophy. Left ventricular diastolic parameters are indeterminate.  LV Wall Scoring: The inferior septum, entire inferior wall, posterior wall, and apex are akinetic. The antero-lateral wall and apical lateral segment are hypokinetic. Right Ventricle: The right ventricular size is normal. No increase in right ventricular wall thickness. Right ventricular systolic function is moderately reduced. There is moderately elevated  pulmonary artery systolic pressure. The tricuspid regurgitant velocity is 2.85 m/s, and with an assumed right atrial pressure of 15 mmHg, the estimated right ventricular systolic pressure is 47.5 mmHg. Left Atrium: Left atrial size was normal in size. Right Atrium: Right atrial size was moderately dilated. Pericardium: There is no evidence of pericardial effusion. Mitral Valve: The mitral valve is abnormal. There is moderate thickening of the mitral valve leaflet(s). Mild mitral valve regurgitation. No evidence of mitral valve stenosis. MV peak gradient, 2.9 mmHg. The mean mitral  valve gradient is 2.0 mmHg. Tricuspid Valve: The tricuspid valve is normal in structure. Tricuspid valve regurgitation is moderate . No evidence of tricuspid stenosis. Aortic Valve: The aortic valve is tricuspid. There is mild calcification of the aortic valve. Aortic valve regurgitation is moderate. Aortic regurgitation PHT measures 501 msec. Aortic valve sclerosis/calcification is present, without any evidence of aortic stenosis. Aortic valve mean gradient measures 5.0 mmHg. Aortic valve peak gradient measures 9.7 mmHg. Aortic valve area, by VTI measures 2.04 cm. Pulmonic Valve: The pulmonic valve was normal in structure. Pulmonic valve regurgitation is mild. No evidence of pulmonic stenosis. Aorta: The aortic root is normal in size and structure. Venous: The inferior vena cava is dilated in size with less than 50% respiratory variability, suggesting right atrial pressure of 15 mmHg. IAS/Shunts: No atrial level shunt detected by color flow Doppler. Additional Comments: 3D imaging was not performed.  LEFT VENTRICLE PLAX 2D LVIDd:         5.30 cm      Diastology LVIDs:         5.10 cm      LV e' lateral:   11.10 cm/s LV PW:         0.80 cm      LV E/e' lateral: 7.2 LV IVS:        0.90 cm LVOT diam:     2.20 cm LV SV:         45 LV SV Index:   27 LVOT Area:     3.80 cm  LV Volumes (MOD) LV vol d, MOD A2C: 173.0 ml LV vol d, MOD A4C: 152.0  ml LV vol s, MOD A2C: 126.0 ml LV vol s, MOD A4C: 130.0 ml LV SV MOD A2C:     47.0 ml LV SV MOD A4C:     152.0 ml LV SV MOD BP:      35.5 ml RIGHT VENTRICLE            IVC RV Basal diam:  3.90 cm    IVC diam: 2.20 cm RV S prime:     6.31 cm/s TAPSE (M-mode): 1.0 cm LEFT ATRIUM             Index        RIGHT ATRIUM           Index LA Vol (A2C):   76.7 ml 45.95 ml/m  RA Area:     23.90 cm LA Vol (A4C):   46.4 ml 27.80 ml/m  RA Volume:   86.80 ml  52.00 ml/m LA Biplane Vol: 61.7 ml 36.96 ml/m  AORTIC VALVE                     PULMONIC VALVE AV Area (Vmax):    2.03 cm      PV Vmax:          0.61 m/s AV Area (Vmean):   2.04 cm      PV Peak grad:     1.5 mmHg AV Area (VTI):     2.04 cm      PR End Diast Vel: 11.02 msec AV Vmax:           156.00 cm/s AV Vmean:          102.000 cm/s AV VTI:            0.220 m AV Peak Grad:      9.7 mmHg AV Mean Grad:      5.0 mmHg LVOT  Vmax:         83.30 cm/s LVOT Vmean:        54.800 cm/s LVOT VTI:          0.118 m LVOT/AV VTI ratio: 0.54 AI PHT:            501 msec  AORTA Ao Root diam: 2.80 cm Ao Asc diam:  3.30 cm MITRAL VALVE               TRICUSPID VALVE MV Area (PHT): 4.89 cm    TR Peak grad:   32.5 mmHg MV Area VTI:   1.86 cm    TR Vmax:        285.00 cm/s MV Peak grad:  2.9 mmHg MV Mean grad:  2.0 mmHg    SHUNTS MV Vmax:       0.86 m/s    Systemic VTI:  0.12 m MV Vmean:      68.0 cm/s   Systemic Diam: 2.20 cm MV E velocity: 79.90 cm/s MV A velocity: 70.00 cm/s MV E/A ratio:  1.14 Arvilla Meres MD Electronically signed by Arvilla Meres MD Signature Date/Time: 10/27/2023/3:20:16 PM    Final      Medications:     Scheduled Medications:  apixaban  5 mg Oral BID   dapagliflozin propanediol  10 mg Oral Daily   docusate sodium  200 mg Oral BID   fentaNYL  1 patch Transdermal Q72H   furosemide  40 mg Intravenous BID   ipratropium  2 spray Each Nare Q12H   pantoprazole  40 mg Oral Daily   pravastatin  80 mg Oral q1800   sacubitril-valsartan  1 tablet Oral BID     Infusions:   PRN Medications: acetaminophen, albuterol, ondansetron (ZOFRAN) IV, polyethylene glycol    Patient Profile   Collin Young is a 72 y.o.male w/ chronic combined systolic and diastolic CHF/ ischemic CM, severe multivessel CAD treated medically, 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     Assessment/Plan   1.  Acute on Chronic Biventricular Heart Failure  Predominantly RV Failure - Echo (2018): EF 10-15%, RV moderately reduced. LHC w/ MVCAD management medically - EF remained 20-30% from 2021 to 2023 when it improved to 40-45%.Echo (9/21): EF down again, 35-40%. RV normal  -Echo repeated. EF down 20-25% RV moderately reduced + WMA.  Dr Gala Romney considering cath.  - Needs additional diuresis. Give 80 mg IV twice a day.    - Continue Entresto 24/26 mg bid  - Continue Farxiga 10 mg daily - Hold spiro with K of 5 and AKI -- CTPE with enlargement of RA/RV likely predominantly RV failure.    2. Pulmonary Embolism  - Diagnosed 10/22. - Suspect incidental due HF. - CTPE negative - Continue Eliquis 5 bid   3. AKI on CKD Stage IIIa - Renal function stable.   4. H/o esophageal CA and throat CA - s/p chemo and XRT. - speech eval completed.    5. Tranaminitis - in the setting of hepatic congestion - AST/ALT 131/282  Length of Stay: 2  Tonye Becket, NP  10/28/2023, 8:32 AM  Advanced Heart Failure Team Pager [phone] (M-F; 7a - 5p)  Please contact [facility] for night-coverage after hours (5p -7a ) and weekends on [email or web address]  Patient seen and examined with the above-signed Advanced Practice Provider and/or Housestaff. I personally reviewed laboratory data, imaging studies and relevant notes. I independently examined  the patient and formulated the important aspects of the plan. I have edited the note to reflect any of my changes or salient points. I have personally discussed the plan  with the patient and/or family.  Echo reviewed personally. EF down slightly 25-30% (was 35-40%).   Diuresing modestly. Says breathing is some better. No CP  General:  Thin frail appearing. No resp difficulty Hoarse voice HEENT: normal Neck: supple. JVP 8-9. Carotids 2+ bilat; no bruits. No lymphadenopathy or thryomegaly appreciated. Cor: PMI nondisplaced. Regular rate & rhythm. No rubs, gallops or murmurs. Lungs: decreased throughout Abdomen: soft, nontender, nondistended. No hepatosplenomegaly. No bruits or masses. Good bowel sounds. Extremities: no cyanosis, clubbing, rash, tr-1+ edema Neuro: alert & orientedx3, cranial nerves grossly intact. moves all 4 extremities w/o difficulty. Affect pleasant  Remains mildly volume overload. EF down a bit on echo - concern for worsening CAD  I reviewed previous cath films and doubt there will be any targets for PCI. With lack of ischemic symptoms will not pursue repeat angio at this time.   Increase lasix to 80 IV bid. Continue to adjust GDMT  Follow renal function.   Arvilla Meres, MD  10:31 AM

## 2023-10-28 NOTE — Plan of Care (Signed)
   Problem: Clinical Measurements: Goal: Respiratory complications will improve Outcome: Progressing   Problem: Activity: Goal: Risk for activity intolerance will decrease Outcome: Progressing   Problem: Coping: Goal: Level of anxiety will decrease Outcome: Progressing

## 2023-10-28 NOTE — Plan of Care (Signed)
  Problem: Education: Goal: Knowledge of General Education information will improve Description: Including pain rating scale, medication(s)/side effects and non-pharmacologic comfort measures Outcome: Progressing   Problem: Clinical Measurements: Goal: Respiratory complications will improve Outcome: Progressing   Problem: Activity: Goal: Risk for activity intolerance will decrease Outcome: Progressing   Problem: Coping: Goal: Level of anxiety will decrease Outcome: Progressing   Problem: Pain Managment: Goal: General experience of comfort will improve and/or be controlled Outcome: Progressing   Problem: Safety: Goal: Ability to remain free from injury will improve Outcome: Progressing

## 2023-10-28 NOTE — Discharge Instructions (Addendum)
 Dry Mouth or Thick Saliva  Dry mouth can be caused by certain cancers and their treatments. Radiation  therapy to the mouth can decrease the amount of saliva your body makes,  or it can make the saliva thick and stringy. Dry mouth can sometimes cause  problems with eating, talking, and sleeping. Dry mouth can also raise your  risk for dental cavities and mouth infections.  Oral Care Tips  Brush your teeth with toothpaste and a softbristle toothbrush after each meal and snack. If your gums are inflamed, talk to your dentist about antibacterial toothpastes to try. Rinse your mouth before and after meals with plain water or a mild homemade mouth rinse (1 quart of water mixed with  teaspoon of salt and 1 teaspoon of baking soda). Swish and spit with club soda, lemon-lime soda, or carbonated water to help loosen and remove dry or thick saliva. Alcohol can make dryness worse, so use alcoholfree mouthwashes. Floss your teeth every day to help prevent cavities. Talk to your doctor about using oral moisturizers, saliva substitutes, and salivastimulating medications. Chew sugar-free gum to help reduce inflammation and help keep the mouth moist. Avoid smoking and chewing tobacco. Keep your lips moist with petroleum jelly, cocoa butter, or a mild lip balm.  Food and Drink Choices to Manage Dry Mouth and Thick Saliva Drink 8 to 10 cups of fluid a day to keep your mouth moist and loosen thick saliva. Limit alcoholic beverages. Carry a water bottle with you and sip from it regularly. Eat soft, bland foods that are cold or at room temperature. Puree fruits and vegetables in a blender. Try well-cooked, tender beef, chicken, or fish. Or try thinned, moist cereals, yogurt, or cottage cheese. Moisten foods in broth, soups, sauces, gravy, oils, or butter. You can also use these foods as dips. To increase saliva, try tart foods and drinks, such as lemonade, lemon sorbet, or cranberry juice.  Sweet foods and  drinks may also help. Avoid hot, spicy, and acidic foods and drinks if you have a sore or tender mouth. Enjoy soothing frozen fruits, such as frozen whole grapes, banana pieces, melon balls, peach slices, or mandarin orange slices. Suck on frozen fruit, ice pops, fruit ices, sorbets, ice chips, or other cold foods. Chew sugar-free gum or suck on sugar-free candy to stimulate saliva. Citrus-flavored candies like lemon drops work best   High-Calorie, High-Protein Nutrition Therapy (2021) A high-calorie, high-protein diet has been recommended to you. Your registered dietitian nutritionist (RDN) may have recommended this diet because you are having difficulty eating enough calories throughout the day, you have lost weight, and/or you need to add protein to your diet. Sometimes you may not feel like eating, even if you know the importance of good nutrition. The recommendations in this handout can help you with the following: Regaining your strength and energy Keeping your body healthy Healing and recovering from surgery or illness and fighting infection Tips: Schedule Your Meals and Snacks Several small meals and snacks are often better tolerated and digested than large meals. Strategies Plan to eat 3 meals and 3 snacks daily. Experiment with timing meals to find out when you have a larger appetite. Appetite may be greatest in the morning after not eating all night so you may prefer to eat your larger meals and snacks in the morning and at lunch. Breakfast-type foods are often better tolerated so eat foods such as eggs, pancakes, waffles and cereal for any meal or snack. Carry snacks with you so you  are prepared to eat every 2 to 3 hours. Determine what works best for you if your body's cues for feeling hungry or full are not working. Eat a small meal or snack even if you don't feel hungry. Set a timer to remind you when it is time to eat. Take a walk before you eat (with health care provider's  approval). Light or moderate physical activity can help you maintain muscle and increase your appetite. Make Eating Enjoyable Taking steps to make the experience enjoyable may help to increase your interest in eating and improve your appetite. Strategies: Eat with others whenever possible. Include your favorite foods to make meals more enjoyable. Try new foods. Save your beverage for the end of the meal so that you have more room for food before you get full. Add Calories to Your Meals and Snacks Try adding calorie-dense foods so that each bite provides more nutrition. Strategies Drink milk, chocolate milk, soy milk, or smoothies instead of low-calorie beverages such as diet drinks or water. Cook with milk or soy milk instead of water when making dishes such as hot cereal, cocoa, or pudding. Add jelly, jam, honey, butter or margarine to bread and crackers. Add jam or fruit to ice cream and as a topping over cake. Mix dried fruit, nuts, granola, honey, or dry cereal with yogurt or hot cereals. Enjoy snacks such as milkshakes, smoothies, pudding, ice cream, or custard. Blend a fruit smoothie of a banana, frozen berries, milk or soy milk, and 1 tablespoon nonfat powdered milk or protein powder. Add Protein to Your Meals and Snacks Choose at least one protein food at each meal and snack to increase your daily intake. Strategies Add  cup nonfat dry milk powder or protein powder to make a high-protein milk to drink or to use in recipes that call for milk. Vanilla or peppermint extract or unsweetened cocoa powder could help to boost the flavor. Add hard-cooked eggs, leftover meat, grated cheese, canned beans or tofu to noodles, rice, salads, sandwiches, soups, casseroles, pasta, tuna and other mixed dishes. Add powdered milk or protein powder to hot cereals, meatloaf, casseroles, scrambled eggs, sauces, cream soups, and shakes. Add beans and lentils to salads, soups, casseroles, and vegetable  dishes. Eat cottage cheese or yogurt, especially Greek yogurt, with fruit as a snack or dessert. Eat peanut or other nut butters on crackers, bread, toast, waffles, apples, bananas or celery sticks. Add it to milkshakes, smoothies, or desserts. Consider a ready-made protein shake. Your RDN will make recommendations. Add Fats to Your Meals and Snacks Try adding fats to your meals and snacks. Fat provides more calories in fewer bites than carbohydrate or protein and adds flavors to your foods. Strategies Snack on nuts and seeds or add them to foods like salads, pasta, cereals, yogurt, and ice cream.  Saut or stir-fry vegetables, meats, chicken, fish or tofu in olive or canola oil.  Add olive oil, other vegetable oils, butter or margarine to soups, vegetables, potatoes, cooked cereal, rice, pasta, bread, crackers, pancakes, or waffles. Snack on olives or add to pasta, pizza, or salad. Add avocado or guacamole to your salads, sandwiches, and other entrees. Include fatty fish such as salmon in your weekly meal plan. For general food safety tips, especially for clients with immunocompromised conditions, ask your RDN for the Food Safety Nutrition Therapy handout. Small Meal and Snack Ideas These snacks and meals are recommended when you have to eat but aren't necessarily hungry.  They are good choices because they  are high in protein and high in calories.  2 graham crackers 2 tablespoons peanut or other nut butter 1 cup milk 2 slices whole wheat toast topped with:  avocado, mashed Seasoning of your choice   cup Greek yogurt  cup fruit  cup granola 2 deviled egg halves 5 whole wheat crackers  1 cup cream of tomato soup  grilled cheese sandwich 1 toasted waffle topped with: 2 tablespoons peanut or nut butter 1 tablespoon jam  Trail mix made with:  cup nuts  cup dried fruit  cup cold cereal, any variety  cup oatmeal or cream of wheat cereal 1 tablespoon peanut or nut butter  cup  diced fruit   High-Calorie, High-Protein Sample 1-Day Menu View Nutrient Info Breakfast 1 egg, scrambled 1 ounce cheddar cheese 1 English muffin, whole wheat 1 tablespoon margarine 1 tablespoon jam  cup orange juice, fortified with calcium and vitamin D  Morning Snack 1 tablespoon peanut butter 1 banana 1 cup 1% milk  Lunch Tuna salad sandwich made with: 2 slices bread, whole wheat 3 ounces tuna mixed with: 1 tablespoon mayonnaise  cup pudding  Afternoon Snack  cup hummus  cup carrots 1 pita  Evening Meal Enchilada casserole made with: 2 corn tortillas 3 ounces ground beef, cooked  cup black beans, cooked  cup corn, cooked 1 ounce grated cheddar cheese  cup enchilada sauce  avocado, sliced, topping for enchilada 1 tablespoon sour cream, topping for enchilada Salad:  cup lettuce, shredded  cup tomatoes, chopped, for salad 1 tablespoon olive oil and vinegar dressing, for salad  Evening Snack  cup Greek yogurt  cup blueberries  cup granola

## 2023-10-28 NOTE — Plan of Care (Signed)
  Problem: Education: Goal: Knowledge of General Education information will improve Description: Including pain rating scale, medication(s)/side effects and non-pharmacologic comfort measures Outcome: Progressing   Problem: Clinical Measurements: Goal: Respiratory complications will improve 10/28/2023 0057 by Carolanne Grumbling, RN Outcome: Progressing 10/28/2023 0046 by Carolanne Grumbling, RN Outcome: Progressing   Problem: Activity: Goal: Risk for activity intolerance will decrease 10/28/2023 0057 by Carolanne Grumbling, RN Outcome: Progressing 10/28/2023 0046 by Carolanne Grumbling, RN Outcome: Progressing   Problem: Coping: Goal: Level of anxiety will decrease 10/28/2023 0057 by Carolanne Grumbling, RN Outcome: Progressing 10/28/2023 0046 by Carolanne Grumbling, RN Outcome: Progressing   Problem: Safety: Goal: Ability to remain free from injury will improve Outcome: Progressing

## 2023-10-28 NOTE — Progress Notes (Signed)
 Initial Nutrition Assessment  DOCUMENTATION CODES:  Non-severe (moderate) malnutrition in context of chronic illness  INTERVENTION:  Continue diet per SLP; Regular with thin liquids Ensure Enlive po BID, each supplement provides 350 kcal and 20 grams of protein. MVI with minerals daily "High Calorie, High Protein Nutrition Therapy" handout added to AVS "Dry Mouth or Thick Saliva" Nutrition handout added to AVS  NUTRITION DIAGNOSIS:  Moderate Malnutrition related to chronic illness (heart disease, ETOH, PNA) as evidenced by mild fat depletion, moderate muscle depletion, severe muscle depletion.  GOAL:  Patient will meet greater than or equal to 90% of their needs  MONITOR:  PO intake, Supplement acceptance, Labs, Weight trends, I & O's  REASON FOR ASSESSMENT:  Consult Diet education  ASSESSMENT:  Pt admitted with worsening SOB and orthopnea x3 days PTA. PMH significant for chronic combined CHF, multivessel CAD, esophageal Ca, remote ETOH abuse, CKD 3a, recently diagnosed with PNA.  Pt sitting up in bedside chair in good spirits. Pt is hard of hearing.  He reports that his appetite waxes and wanes but during admission has been really good.   He lives alone. He reports that he gets up and eats breakfast which includes grits, cheerios, apple sauce with meds and an Ensure Enlive.  Lunch is something "snacky" and light.  Dinner is usually a 4 course meal. He has a church friend that prepares meals for him that will last a week. Denies use of added salt with meals.   Pt denies difficulty chewing or swallowing but endorses a tingling in his throat sometimes when he swallows. He mentions that he has lots of mucus that has not subsided since his last chemo/radiation treatment.   Meal completions: 100% x3 recorded meals (2/26-2/27)  Pt reports that his usual weight is around 125 lbs and fluctuates up and down. Cannot recall having significant weight loss recently. Weight is elevated d/t  fluid accumulation.   Pt reports that his weight typically fluctuates but does not recall any significant changes. His typical weight is about 125 lbs. Reports that his weight today was 128 lbs.   Admit weight: 60.1 kg Current weight:  58.3 kg  Medications: farxiga, colace BID, lasix 80mg  BID, ferric gluconate   Labs:  Sodium 132 BUN 34 Cr 1.44 GFR 52  UOP: x24 hours I/O's: - since admit  NUTRITION - FOCUSED PHYSICAL EXAM: Despite pt's report of stable PO intake, based on muscle and fat deficits, it is likely pt has a chronic period of undernutrition.  Flowsheet Row Most Recent Value  Orbital Region Mild depletion  Upper Arm Region Severe depletion  Thoracic and Lumbar Region Mild depletion  Buccal Region Mild depletion  Temple Region No depletion  Clavicle Bone Region Moderate depletion  Clavicle and Acromion Bone Region Moderate depletion  Scapular Bone Region Severe depletion  Dorsal Hand No depletion  Patellar Region Severe depletion  Anterior Thigh Region Severe depletion  Posterior Calf Region Severe depletion  Edema (RD Assessment) None  Hair Reviewed  Eyes Other (Comment)  [corneal arcus]  Mouth Reviewed  Skin Reviewed  Nails Reviewed       Diet Order:   Diet Order             Diet regular Room service appropriate? Yes with Assist; Fluid consistency: Thin  Diet effective now                   EDUCATION NEEDS:  Education needs have been addressed  Skin:  Skin Assessment: Reviewed  RN Assessment  Last BM:  2/27  Height:  Ht Readings from Last 1 Encounters:  10/26/23 5\' 6"  (1.676 m)    Weight:  Wt Readings from Last 1 Encounters:  10/28/23 58.3 kg   BMI:  Body mass index is 20.74 kg/m.  Estimated Nutritional Needs:   Kcal:  1500-1700  Protein:  75-90g  Fluid:  >/=1.5L  Drusilla Kanner, RDN, LDN Clinical Nutrition

## 2023-10-28 NOTE — Progress Notes (Signed)
 Modified Barium Swallow Study  Patient Details  Name: Collin Young MRN: [id number] Date of Birth: 1950-09-23  Today's Date: 10/28/2023  Modified Barium Swallow completed.  Full report located under Chart Review in the Imaging Section.  History of Present Illness Collin Young  is a 73 y.o. male, with history of ischemic cardiomyopathy, chronic combined systolic and diastolic CHF EF 56% on last echocardiogram, COPD, CAD, GERD, history of esophageal cancer, hx of PEG in 2018, prostate cancer, remote history of alcohol abuse quit in 2001, chronic iron deficiency anemia, hypertension, dyslipidemia, CKD stage IIIa baseline creatinine around 1.4, history of PE on Eliquis. Per chart pt diagnosed with community-acquired pneumonia about 2 weeks ago. Admitted with SOB, orthopnea over the last 2 to 3 days, he presented to [facility] where he had a CT angiogram of the chest, and diagnosed with acute on chronic CHF. CT chest Atelectasis in the lung bases is likely compressive but could indicate pneumonia. MBS in 2018 severe oropharyngeal dysphagia, silent aspiration thin liquids but clear with vollitional cough, regular/thin liq recommended.   Clinical Impression MBS completed for pt with history of esophageal cancer s/p radiation in 2015. He demonstrates significant pharyngoesophageal dysphagia with primary impairment of decreased PES opening in addition to decreased anterior hyoid excursion, decreased laryngeal elevation and glottal closure. Impairments resulted in aspiration of thin, nectar and honey thick liquids with immediate and delayed coughs. Maximum vallecular and pyriform sinues residue with solid textures and moderate with thin and mild amounts entering the PES with each swallow. Solid did not enter airway and puree resulted in flash penetration (PAS 2). Multiple strategies attempted with thin including chin tuck, left/right head turn, reclined position, posterior head tilt and Mendelsohn were  ineffective. When pt was cued to tuck chin, hold breath, swallow and produce hard throat clear with thin this led to brief penetration that was consistently ejected by throat clear and pt consistently demonstrated accurate use of strategies. Esophageal scan revealed  stasis mid esophagus. Pt is suspected to be a chronic aspirator and will continue to be at risk however recommendation of regular texture, thin liquids following strategies mitigated risk during MBS. Recommend meds crushed and cup sips. ST will continue to follow while on acute and trial therapy to focus on PES opening (Shakir technique). Factors that may increase risk of adverse event in presence of aspiration Rubye Oaks & Clearance Coots 2021): Respiratory or GI disease;Frequent aspiration of large volumes (COPD)  Swallow Evaluation Recommendations Recommendations: PO diet PO Diet Recommendation: Regular;Thin liquids (Level 0) Liquid Administration via: Cup;No straw Medication Administration: Crushed with puree Supervision: Patient able to self-feed Swallowing strategies  : Slow rate;Small bites/sips;Chin tuck;Hold breath before and during swallow (supraglottic swallow);Hard cough after swallowing Postural changes: Stay upright 30-60 min after meals;Position pt fully upright for meals Oral care recommendations: Oral care BID (2x/day)      Royce Macadamia 10/28/2023,2:42 PM

## 2023-10-29 ENCOUNTER — Other Ambulatory Visit (HOSPITAL_COMMUNITY): Payer: Self-pay

## 2023-10-29 DIAGNOSIS — I509 Heart failure, unspecified: Secondary | ICD-10-CM

## 2023-10-29 DIAGNOSIS — I5023 Acute on chronic systolic (congestive) heart failure: Secondary | ICD-10-CM | POA: Diagnosis not present

## 2023-10-29 LAB — BASIC METABOLIC PANEL
Anion gap: 13 (ref 5–15)
BUN: 29 mg/dL — ABNORMAL HIGH (ref 8–23)
CO2: 31 mmol/L (ref 22–32)
Calcium: 8.7 mg/dL — ABNORMAL LOW (ref 8.9–10.3)
Chloride: 88 mmol/L — ABNORMAL LOW (ref 98–111)
Creatinine, Ser: 1.38 mg/dL — ABNORMAL HIGH (ref 0.61–1.24)
GFR, Estimated: 54 mL/min — ABNORMAL LOW (ref >60)
Glucose, Bld: 136 mg/dL — ABNORMAL HIGH (ref 70–99)
Potassium: 3.9 mmol/L (ref 3.5–5.1)
Sodium: 132 mmol/L — ABNORMAL LOW (ref 135–145)

## 2023-10-29 MED ORDER — POTASSIUM CHLORIDE CRYS ER 20 MEQ PO TBCR
20.0000 meq | EXTENDED_RELEASE_TABLET | Freq: Once | ORAL | Status: AC
  Administered 2023-10-29: 20 meq via ORAL
  Filled 2023-10-29: qty 1

## 2023-10-29 MED ORDER — FUROSEMIDE 40 MG PO TABS
40.0000 mg | ORAL_TABLET | Freq: Every day | ORAL | Status: DC
  Administered 2023-10-29: 40 mg via ORAL
  Filled 2023-10-29: qty 1

## 2023-10-29 MED ORDER — SPIRONOLACTONE 12.5 MG HALF TABLET
12.5000 mg | ORAL_TABLET | Freq: Every day | ORAL | Status: DC
  Administered 2023-10-29: 12.5 mg via ORAL
  Filled 2023-10-29: qty 1

## 2023-10-29 MED ORDER — FUROSEMIDE 40 MG PO TABS
40.0000 mg | ORAL_TABLET | Freq: Every day | ORAL | 1 refills | Status: DC
Start: 2023-10-29 — End: 2023-11-02
  Filled 2023-10-29: qty 30, 30d supply, fill #0

## 2023-10-29 MED ORDER — SPIRONOLACTONE 25 MG PO TABS: 12.5000 mg | ORAL_TABLET | Freq: Every day | ORAL | Status: DC

## 2023-10-29 NOTE — TOC Transition Note (Signed)
 Transition of Care [facility]) - Discharge Note   Patient Details  Name: Collin Young MRN: [id number] Date of Birth: 20-Jun-1951  Transition of Care [facility]) CM/SW Contact:  Nicanor Bake Phone Number: [phone] 10/29/2023, 12:45 PM   Clinical Narrative:   [facility] CSW met with pt at bedside. Pt stated that he has a family friend will be transporting him home today at dc.   CSW informed pt about of upcoming hospital follow up appointments.       Barriers to Discharge: Continued Medical Work up   Patient Goals and CMS Choice Patient states their goals for this hospitalization and ongoing recovery are:: get better          Discharge Placement                       Discharge Plan and Services Additional resources added to the After Visit Summary for                                       Social Drivers of Health (SDOH) Interventions SDOH Screenings   Food Insecurity: No Food Insecurity (10/26/2023)  Housing: Low Risk  (10/26/2023)  Transportation Needs: No Transportation Needs (10/26/2023)  Utilities: Not At Risk (07/20/2023)  Alcohol Screen: Low Risk  (07/20/2023)  Depression (PHQ2-9): Low Risk  (07/20/2023)  Financial Resource Strain: Low Risk  (07/20/2023)  Physical Activity: Inactive (07/20/2023)  Social Connections: Moderately Integrated (10/26/2023)  Stress: No Stress Concern Present (07/20/2023)  Tobacco Use: Medium Risk (10/25/2023)  Health Literacy: Adequate Health Literacy (07/20/2023)     Readmission Risk Interventions    06/03/2021   12:51 PM  Readmission Risk Prevention Plan  Transportation Screening Complete  PCP or Specialist Appt within 5-7 Days Complete  Home Care Screening Complete  Medication Review (RN CM) Complete

## 2023-10-29 NOTE — Progress Notes (Addendum)
 Advanced Heart Failure Rounding Note  Cardiologist: Chilton Si, MD  Chief Complaint: Heart Failure Subjective:   2/26: Diuresed with IV lasix . GDMT added. Echo EF 25-30%  RV moderately reduced. Regional WMA.  2/27 : Cath reviewed by Dr Gala Romney. No targets. Diuresed with IV lasix.   I/O not accurate.    Feels great. Dancing in the room.    Objective:   Weight Range: 53.3 kg Body mass index is 18.98 kg/m.   Vital Signs:   Temp:  [97.7 F (36.5 C)-98.8 F (37.1 C)] 98.8 F (37.1 C) (02/28 0738) Pulse Rate:  [91-103] 103 (02/28 0738) Resp:  [13-20] 20 (02/28 0738) BP: (95-126)/(58-80) 125/73 (02/28 0738) SpO2:  [92 %-100 %] 96 % (02/28 0738) Weight:  [53.3 kg] 53.3 kg (02/28 0408) Last BM Date : 10/28/23  Weight change: Filed Weights   10/27/23 0458 10/28/23 0420 10/29/23 0408  Weight: 59.3 kg 58.3 kg 53.3 kg    Intake/Output:   Intake/Output Summary (Last 24 hours) at 10/29/2023 0744 Last data filed at 10/29/2023 0512 Gross per 24 hour  Intake 1350.06 ml  Output 1700 ml  Net -349.94 ml      Physical Exam   General: HOH  No resp difficulty. Standing in the room  Neck: supple. JVP 5-6  Cor: PMI nondisplaced. Regular rate & rhythm. No rubs, gallops or murmurs. Lungs: clear Abdomen: soft, nontender, nondistended.  Extremities: no cyanosis, clubbing, rash, edema Neuro: alert & oriented x3  Telemetry   SR 90-100s personally checked   EKG    N/A  Labs    CBC Recent Labs    10/27/23 0328 10/28/23 0257  WBC 4.8 5.2  NEUTROABS 2.9  --   HGB 10.3* 10.4*  HCT 35.1* 35.6*  MCV 76.0* 75.7*  PLT 250 268   Basic Metabolic Panel Recent Labs    96/29/52 0328 10/28/23 0257 10/29/23 0311  NA 133* 132* 132*  K 5.0 4.1 3.9  CL 94* 89* 88*  CO2 26 28 31   GLUCOSE 110* 149* 136*  BUN 30* 34* 29*  CREATININE 1.69* 1.44* 1.38*  CALCIUM 9.1 8.7* 8.7*  MG 2.3  --   --   PHOS 4.8*  --   --    Liver Function Tests Recent Labs     10/27/23 0328  AST 131*  ALT 282*  ALKPHOS 58  BILITOT 0.5  PROT 7.3  ALBUMIN 3.3*   No results for input(s): "LIPASE", "AMYLASE" in the last 72 hours. Cardiac Enzymes No results for input(s): "CKTOTAL", "CKMB", "CKMBINDEX", "TROPONINI" in the last 72 hours.  BNP: BNP (last 3 results) Recent Labs    10/25/23 1945 10/27/23 0328  BNP 2,130.3* 3,497.7*    ProBNP (last 3 results) No results for input(s): "PROBNP" in the last 8760 hours.   D-Dimer No results for input(s): "DDIMER" in the last 72 hours. Hemoglobin A1C No results for input(s): "HGBA1C" in the last 72 hours. Fasting Lipid Panel No results for input(s): "CHOL", "HDL", "LDLCALC", "TRIG", "CHOLHDL", "LDLDIRECT" in the last 72 hours. Thyroid Function Tests Recent Labs    10/27/23 0328  TSH 5.003*    Other results:   Imaging    DG Swallowing Func-Speech Pathology Result Date: 10/28/2023 Table formatting from the original result was not included. Modified Barium Swallow Study Patient Details Name: AZAM GERVASI MRN: [id number] Date of Birth: 07/02/1951 Today's Date: 10/28/2023 HPI/PMH: HPI: Leeum Sankey  is a 73 y.o. male, with history of ischemic cardiomyopathy, chronic combined systolic and  diastolic CHF EF 45% on last echocardiogram, COPD, CAD, GERD, history of esophageal cancer, hx of PEG in 2018, prostate cancer, remote history of alcohol abuse quit in 2001, chronic iron deficiency anemia, hypertension, dyslipidemia, CKD stage IIIa baseline creatinine around 1.4, history of PE on Eliquis. Per chart pt diagnosed with community-acquired pneumonia about 2 weeks ago. Admitted with SOB, orthopnea over the last 2 to 3 days, he presented to [facility] where he had a CT angiogram of the chest, and diagnosed with acute on chronic CHF. CT chest Atelectasis in the lung bases is likely compressive but could indicate pneumonia. MBS in 2018 severe oropharyngeal dysphagia, silent aspiration thin liquids but clear with  vollitional cough, regular/thin liq recommended. Clinical Impression: Clinical Impression: MBS completed for pt with history of esophageal cancer s/p radiation in 2015. He demonstrates significant pharyngoesophageal dysphagia with primary impairment of decreased PES opening in addition to decreased anterior hyoid excursion, decreased laryngeal elevation and glottal closure. Impairments resulted in aspiration of thin, nectar and honey thick liquids with immediate and delayed coughs. Maximum vallecular and pyriform sinues residue with solid textures and moderate with thin and mild amounts entering the PES with each swallow. Solid did not enter airway and puree resulted in flash penetration (PAS 2). Multiple strategies attempted with thin including chin tuck, left/right head turn, reclined position, posterior head tilt and Mendelsohn were ineffective. When pt was cued to tuck chin, hold breath, swallow and produce hard throat clear with thin this led to brief penetration that was consistently ejected by throat clear and pt consistently demonstrated accurate use of strategies. Esophageal scan revealed  stasis mid esophagus. Pt is suspected to be a chronic aspirator and will continue to be at risk however recommendation of regular texture, thin liquids following strategies mitigated risk during MBS. Recommend meds crushed and cup sips. ST will continue to follow while on acute and trial therapy to focus on PES opening (Shakir technique). Factors that may increase risk of adverse event in presence of aspiration Rubye Oaks & Clearance Coots 2021): Factors that may increase risk of adverse event in presence of aspiration Rubye Oaks & Clearance Coots 2021): Respiratory or GI disease; Frequent aspiration of large volumes (COPD) Recommendations/Plan: Swallowing Evaluation Recommendations Swallowing Evaluation Recommendations Recommendations: PO diet PO Diet Recommendation: Regular; Thin liquids (Level 0) Liquid Administration via: Cup; No straw  Medication Administration: Crushed with puree Supervision: Patient able to self-feed Swallowing strategies  : Slow rate; Small bites/sips; Chin tuck; Hold breath before and during swallow (supraglottic swallow); Hard cough after swallowing Postural changes: Stay upright 30-60 min after meals; Position pt fully upright for meals Oral care recommendations: Oral care BID (2x/day) Treatment Plan Treatment Plan Treatment recommendations: Therapy as outlined in treatment plan below Follow-up recommendations: Follow physicians's recommendations for discharge plan and follow up therapies Functional status assessment: Patient has had a recent decline in their functional status and demonstrates the ability to make significant improvements in function in a reasonable and predictable amount of time. Treatment frequency: Min 2x/week Treatment duration: 2 weeks Interventions: Aspiration precaution training; Compensatory techniques; Patient/family education; Diet toleration management by SLP Recommendations Recommendations for follow up therapy are one component of a multi-disciplinary discharge planning process, led by the attending physician.  Recommendations may be updated based on patient status, additional functional criteria and insurance authorization. Assessment: Orofacial Exam: Orofacial Exam Oral Cavity: Oral Hygiene: WFL Oral Cavity - Dentition: Other (Comment) (dentures not donned during MBS) Orofacial Anatomy: WFL Oral Motor/Sensory Function: WFL Anatomy: Anatomy: Suspected cervical osteophytes Boluses Administered: Boluses  Administered Boluses Administered: Thin liquids (Level 0); Mildly thick liquids (Level 2, nectar thick); Moderately thick liquids (Level 3, honey thick); Puree; Solid  Oral Impairment Domain: Oral Impairment Domain Lip Closure: No labial escape Tongue control during bolus hold: Cohesive bolus between tongue to palatal seal Bolus preparation/mastication: Slow prolonged chewing/mashing with complete  recollection (likley due to dentures not present) Bolus transport/lingual motion: Brisk tongue motion Oral residue: Complete oral clearance Location of oral residue : N/A Initiation of pharyngeal swallow : Valleculae  Pharyngeal Impairment Domain: Pharyngeal Impairment Domain Soft palate elevation: No bolus between soft palate (SP)/pharyngeal wall (PW) Laryngeal elevation: Partial superior movement of thyroid cartilage/partial approximation of arytenoids to epiglottic petiole Anterior hyoid excursion: Partial anterior movement Epiglottic movement: Partial inversion Laryngeal vestibule closure: Incomplete, narrow column air/contrast in laryngeal vestibule Pharyngeal stripping wave : Present - diminished Pharyngeal contraction (A/P view only): N/A Pharyngoesophageal segment opening: Minimal distention/minimal duration, marked obstruction of flow Tongue base retraction: No contrast between tongue base and posterior pharyngeal wall (PPW) Pharyngeal residue: Majority of contrast within or on pharyngeal structures Location of pharyngeal residue: Valleculae; Pyriform sinuses  Esophageal Impairment Domain: Esophageal Impairment Domain Esophageal clearance upright position: Esophageal retention Pill: No data recorded Penetration/Aspiration Scale Score: Penetration/Aspiration Scale Score 1.  Material does not enter airway: Solid 2.  Material enters airway, remains ABOVE vocal cords then ejected out: Puree 7.  Material enters airway, passes BELOW cords and not ejected out despite cough attempt by patient: Thin liquids (Level 0); Mildly thick liquids (Level 2, nectar thick); Moderately thick liquids (Level 3, honey thick) Compensatory Strategies: Compensatory Strategies Compensatory strategies: Yes Multiple swallows: -- (moderately effective) Chin tuck: Ineffective (ineffective in isolation but effective in combination with other strategies) Ineffective Chin Tuck: Thin liquid (Level 0) Left head turn: Ineffective Ineffective  Left Head Turn: Thin liquid (Level 0) Right head turn: Ineffective Ineffective Right Head Turn: Thin liquid (Level 0) Supraglottic swallow: Ineffective (effective in combination with other strategies) Ineffective Supraglottic Swallow: Thin liquid (Level 0) Mendelsohn : Ineffective Ineffective Mendelsohn: Thin liquid (Level 0) Reclining posture: Ineffective Ineffective Reclining Posture: Thin liquid (Level 0) Posterior head tilt: Ineffective Ineffective Posterior head tilt: Thin liquid (Level 0)   General Information: Caregiver present: No  Diet Prior to this Study: Regular; Mildly thick liquids (Level 2, nectar thick) (SLP)   Temperature : Normal   Respiratory Status: WFL   Supplemental O2: None (Room air)   History of Recent Intubation: No  Behavior/Cognition: Alert; Cooperative; Pleasant mood Self-Feeding Abilities: Able to self-feed Baseline vocal quality/speech: Dysphonic Volitional Cough: Able to elicit Volitional Swallow: Able to elicit Exam Limitations: No limitations Goal Planning: Prognosis for improved oropharyngeal function: Fair Barriers to Reach Goals: Severity of deficits No data recorded No data recorded Consulted and agree with results and recommendations: Patient; Nurse; Physician Pain: Pain Assessment Pain Assessment: No/denies pain Faces Pain Scale: 0 End of Session: Start Time:SLP Start Time (ACUTE ONLY): 1155 Stop Time: SLP Stop Time (ACUTE ONLY): 1227 Time Calculation:SLP Time Calculation (min) (ACUTE ONLY): 32 min Charges: SLP Evaluations $ SLP Speech Visit: 1 Visit SLP Evaluations $BSS Swallow: 1 Procedure $MBS Swallow: 1 Procedure SLP visit diagnosis: SLP Visit Diagnosis: Dysphagia, pharyngoesophageal phase (R13.14) Past Medical History: Past Medical History: Diagnosis Date  Chronic combined systolic and diastolic CHF (congestive heart failure) (HCC)   followed by dr t. Duke Salvia  COPD (chronic obstructive pulmonary disease) (HCC)   Coronary artery disease cardiologist-- dr Chilton Si   per cardiac cath 01-05-2017  chronic total occlusion pLCx with  collaterals, 99% severe calcified prox. to mid RCA, otherwise mild to moderate CAD (medically managed)  Esophageal cancer, stage IIIB [facility]) oncologist-  dr ennever/  dr moody  dx 2010  SCC Stage IIIB completed chemoradiation;  localized recurrent left piriform sinus 01/ 2015,  completed concurrent chemoradiatoin 04/ 2015  GERD (gastroesophageal reflux disease)   09-07-2018   no issues since Gtube removed 06/ 2019  History of alcohol abuse   quit 2001  History of cancer chemotherapy   2010;   2015  History of radiation therapy   10-19-2013 to  12-05-2013 pyriform sinus 69.96 Gy/12fx;   Radiation completed 2010 for esophageal cancer  History of seizure 2001  alcohol withdrawal  HOH (hard of hearing)   Hyperlipidemia   Hypertension   Iron deficiency anemia due to chronic blood loss 11/14/2021  Ischemic cardiomyopathy 12/2016  01-03-2017  echo,  ef 10-15%/   echo 05-06-2017 EF improved to 45-50%  Prostate cancer [facility]) urologist-  dr ottelin/  oncologist-- dr Kathrynn Running  dx 05-27-2018--- Stage T1c,  Gleason 4+3,  PSA 9.3--  scheduled for brachytherapy 09-23-2017  Renal cyst, left  Past Surgical History: Past Surgical History: Procedure Laterality Date  BIOPSY  12/18/2021  Procedure: BIOPSY;  Surgeon: Rachael Fee, MD;  Location: [facility];  Service: Gastroenterology;;  CARDIOVASCULAR STRESS TEST  10/09/09  normal nuclearr stress test, EF 57% Adolph Pollack)  CENTRAL LINE INSERTION  06/16/2021  Procedure: CENTRAL LINE INSERTION;  Surgeon: Dolores Patty, MD;  Location: [facility] INVASIVE CV LAB;  Service: Cardiovascular;;  ESOPHAGOGASTRODUODENOSCOPY (EGD) WITH PROPOFOL N/A 12/18/2021  Procedure: ESOPHAGOGASTRODUODENOSCOPY (EGD) WITH PROPOFOL;  Surgeon: Rachael Fee, MD;  Location: [facility]COPY;  Service: Gastroenterology;  Laterality: N/A;  IR GASTROSTOMY TUBE MOD SED  01/13/2017  IR GASTROSTOMY TUBE REMOVAL  02/03/2018  IR PATIENT EVAL TECH 0-60 MINS  03/25/2017  IR  REMOVAL TUN ACCESS W/ PORT W/O FL MOD SED  01/15/2017  IR REPLACE G-TUBE SIMPLE WO FLUORO  01/13/2018  LARYNGOSCOPY N/A 09/15/2013  Procedure: LARYNGOSCOPY;  Surgeon: Christia Reading, MD;  Location: [facility] OR;  Service: ENT;  Laterality: N/A;  direct laryngoscopy with biopsy and esophagoscopy  RADIOACTIVE SEED IMPLANT N/A 09/23/2018  Procedure: RADIOACTIVE SEED IMPLANT/BRACHYTHERAPY IMPLANT;  Surgeon: Ihor Gully, MD;  Location: [facility];  Service: Urology;  Laterality: N/A;  RIGHT HEART CATH N/A 06/16/2021  Procedure: RIGHT HEART CATH;  Surgeon: Dolores Patty, MD;  Location: [facility] INVASIVE CV LAB;  Service: Cardiovascular;  Laterality: N/A;  RIGHT/LEFT HEART CATH AND CORONARY ANGIOGRAPHY N/A 01/05/2017  Procedure: Right/Left Heart Cath and Coronary Angiography;  Surgeon: Kathleene Hazel, MD;  Location: [facility] INVASIVE CV LAB;  Service: Cardiovascular;  Laterality: N/A;  TRANSTHORACIC ECHOCARDIOGRAM  05/06/2017  ef 45-50%,  grade 1 diastolic dysfunction/  AV severe calcified non coronary cusp with moderate regurg. , no stenosis (valve area per echo 01-05-2017 1.08cm^2)/  mild MR, TR, and PR/  mild LAE Royce Macadamia 10/28/2023, 2:41 PM    Medications:     Scheduled Medications:  apixaban  5 mg Oral BID   dapagliflozin propanediol  10 mg Oral Daily   docusate sodium  200 mg Oral BID   feeding supplement  237 mL Oral BID BM   fentaNYL  1 patch Transdermal Q72H   ipratropium  2 spray Each Nare Q12H   multivitamin with minerals  1 tablet Oral Daily   pantoprazole  40 mg Oral Daily   pravastatin  80 mg Oral q1800   sacubitril-valsartan  1 tablet Oral BID    Infusions:  ferric gluconate (FERRLECIT) IVPB 250 mg (10/28/23 1446)    PRN Medications: acetaminophen, albuterol, ondansetron (ZOFRAN) IV, polyethylene glycol    Patient Profile   Marqui Formby is a 72 y.o.male w/ chronic combined systolic and diastolic CHF/ ischemic CM, severe multivessel CAD treated medically, h/o  esophageal and piriform sinus cancer treated w/ chemo + radiation, prostate cancer, stage IIIa CKD, COPD, HTN and HLD.     Assessment/Plan   1.  Acute on Chronic Biventricular Heart Failure  Predominantly RV Failure - Echo (2018): EF 10-15%, RV moderately reduced. LHC w/ MVCAD management medically - EF remained 20-30% from 2021 to 2023 when it improved to 40-45%.Echo (9/21): EF down again, 35-40%. RV normal  -Echo repeated. EF down 20-25% RV moderately reduced + WMA.  Dr Gala Romney  reviewed previous cath. No targets. .  - Much improved. Appears euvolemic. Stop IV lasix. Start lasix 40 mg daily.  - Continue Entresto 24/26 mg bid  - Continue Farxiga 10 mg daily - Add 12.5 mg spiro daily.  -- CTPE with enlargement of RA/RV likely predominantly RV failure.    2. Pulmonary Embolism  - Diagnosed 10/22. - Suspect incidental due HF. - CTPE negative - Continue Eliquis 5 bid   3. AKI on CKD Stage IIIa - Creatinine peaked at 1.7--> today 1.4.  - Renal function stable.    4. H/o esophageal CA and throat CA - s/p chemo and XRT. - speech eval completed.    5. Tranaminitis - in the setting of hepatic congestion - AST/ALT 131/282  We will set up post hospital follow up.   Length of Stay: 3  Amy Clegg, NP  10/29/2023, 7:44 AM  Advanced Heart Failure Team Pager [phone] (M-F; 7a - 5p)  Please contact [facility] for night-coverage after hours (5p -7a ) and weekends on [email or web address]  Patient seen and examined with the above-signed Advanced Practice Provider and/or Housestaff. I personally reviewed laboratory data, imaging studies and relevant notes. I independently examined the patient and formulated the important aspects of the plan. I have edited the note to reflect any of my changes or salient points. I have personally discussed the plan with the patient and/or family.  Feels much better with diuresis. No further SOB. No CP  General:  Thin male No resp difficulty HEENT: normal Neck:  supple. no JVD. Carotids 2+ bilat; no bruits. No lymphadenopathy or thryomegaly appreciated. Cor: PMI nondisplaced. Regular rate & rhythm. No rubs, gallops or murmurs. Lungs: decreased throughout  Abdomen: soft, nontender, nondistended. No hepatosplenomegaly. No bruits or masses. Good bowel sounds. Extremities: no cyanosis, clubbing, rash, edema Neuro: alert & orientedx3, cranial nerves grossly intact. moves all 4 extremities w/o difficulty. Affect pleasant  He is much improved with diuresis. Agree with d/c home today.  Med regimen reviewed with PharmD.   Will need close f/u in HF Clinic   Arvilla Meres, MD  10:53 AM

## 2023-10-29 NOTE — Progress Notes (Signed)
 AVS reviewed with patient. Awaiting for iron infusion to be completed approximately around 1300. Family aware to have transportation to disposition as well.

## 2023-10-29 NOTE — Discharge Summary (Signed)
 Physician Discharge Summary  ZAKEE DEERMAN ZOX:[id number] DOB: February 12, 1951 DOA: 10/25/2023  PCP: Pearline Cables, MD  Admit date: 10/25/2023 Discharge date: 10/29/2023  Time spent: 45 minutes  Recommendations for Outpatient Follow-up:  Advanced heart failure clinic in 2 weeks Has severe dysphagia, needs to follow strict aspiration precautions   Discharge Diagnoses:  Principal Problem:   Acute on chronic congestive heart failure (HCC) Biventricular failure Severe dysphagia Esophageal CA   GERD without esophagitis   Mixed hyperlipidemia   Ischemic cardiomyopathy   Malignant neoplasm of prostate (HCC)   Coronary artery disease involving native coronary artery of native heart   Chronic kidney disease, stage 3a (HCC)   Late latent syphilis   Pulmonary embolism (HCC)   Acute on chronic systolic CHF (congestive heart failure) (HCC)   COPD (chronic obstructive pulmonary disease) (HCC)   Hyponatremia   Iron deficiency anemia due to chronic blood loss   Discharge Condition: Improved  Diet recommendation: Heart healthy  Filed Weights   10/27/23 0458 10/28/23 0420 10/29/23 0408  Weight: 59.3 kg 58.3 kg 53.3 kg    History of present illness:  72/M with chronic combined CHF, multivessel CAD on medical management, esophageal CA, remote EtOH abuse, CKD 3 AA, history of PE on Eliquis few weeks ago was diagnosed with pneumonia, treated with antibiotics, briefly improved subsequently started developing shortness of breath and orthopnea X 3 days presented to [facility]. CTA chest with no PE, moderate bilateral pleural effusions, interstitial edema  Hospital Course:   Acute on chronic biventricular failure -Last echo 5/24 with EF 35-40%, inferior akinesis, mild MR -repeat ECHO with EF 25-30%, mod reduced RV and WMA -Diuresed with IV Lasix, volume status has improved, transitioned to oral Lasix, continue [employer]  -Cards following, reviewed previous cath films, doubtful of  targets for PCI, no plan to pursue LHC at this time -Follow-up with advanced heart failure clinic   History of PE -Continue Eliquis, current CTA negative   AKI on CKD 3a -Improving  Severe dysphagia -Likely related to history of esophageal CA and radiation -SLP eval completed, significant risk noted, regardless of diet consistencies, felt to be a chronic aspirator, they recommended regular diet with thin liquids and strict aspiration precautions -Able to compensate for the most part at this time   History of esophageal, left pyriform sinus CA -Completed chemo and XRT in 2010 for esophageal CA -Completed therapy in 2015 for pyriform sinus CA -SLP eval completed, significant risk noted, regular diet with thin liquids recommended and strict aspiration precautions  Early stage prostate CA -Completed prostate brachytherapy -Followed by Dr. Myna Hidalgo   Chronic iron deficiency anemia Stable, givenIV iron x2 days  Discharge Exam: Vitals:   10/29/23 0410 10/29/23 0738  BP:  125/73  Pulse: 100 (!) 103  Resp:  20  Temp:  98.8 F (37.1 C)  SpO2: 94% 96%   General exam: Elderly thinly built male sitting up in the recliner, AAOx3 HEENT: Positive JVD CVS: S1-S2, regular rhythm Lungs: Few scattered rhonchi otherwise clear Abdomen: Soft, nontender, bowel sounds present Extremities: No edema  Skin: No rashes Psychiatry:  Mood & affect appropriate.   Discharge Instructions   Discharge Instructions     Diet - low sodium heart healthy   Complete by: As directed    Increase activity slowly   Complete by: As directed       Allergies as of 10/29/2023   No Known Allergies      Medication List     STOP  taking these medications    azithromycin 250 MG tablet Commonly known as: Zithromax Z-Pak   Bayer Low Dose 81 MG tablet Generic drug: aspirin EC   Corlanor 5 MG Tabs tablet Generic drug: ivabradine       TAKE these medications    albuterol 108 (90 Base) MCG/ACT  inhaler Commonly known as: VENTOLIN HFA Inhale 2 puffs into the lungs every 6 (six) hours as needed for wheezing or shortness of breath.   dapagliflozin propanediol 10 MG Tabs tablet Commonly known as: FARXIGA Take 1 tablet (10 mg total) by mouth daily.   Eliquis 5 MG Tabs tablet Generic drug: apixaban TAKE 1 TABLET(5 MG) BY MOUTH TWICE DAILY   Entresto 24-26 MG Generic drug: sacubitril-valsartan Take 1 tablet by mouth 2 (two) times daily.   fentaNYL 12 MCG/HR Commonly known as: DURAGESIC Place 1 patch onto the skin every 3 (three) days.   furosemide 40 MG tablet Commonly known as: LASIX Take 1 tablet (40 mg total) by mouth daily. What changed:  when to take this reasons to take this   ipratropium 0.03 % nasal spray Commonly known as: ATROVENT Place 2 sprays into both nostrils every 12 (twelve) hours.   polyethylene glycol 17 g packet Commonly known as: MIRALAX / GLYCOLAX Take 17 g by mouth daily as needed for mild constipation.   pravastatin 80 MG tablet Commonly known as: PRAVACHOL TAKE 1 TABLET(80 MG) BY MOUTH EVERY EVENING   Repatha SureClick 140 MG/ML Soaj Generic drug: Evolocumab Inject 140 mg into the skin every 14 (fourteen) days. Please keep your upcoming appointment for refills.   spironolactone 25 MG tablet Commonly known as: ALDACTONE Take 0.5 tablets (12.5 mg total) by mouth daily. What changed:  how much to take when to take this reasons to take this   tadalafil 20 MG tablet Commonly known as: CIALIS Take 0.5-1 tablets (10-20 mg total) by mouth every other day as needed for erectile dysfunction.   umeclidinium bromide 62.5 MCG/ACT Aepb Commonly known as: INCRUSE ELLIPTA Inhale 1 puff into the lungs daily.       No Known Allergies  Follow-up Information     Copland, Gwenlyn Found, MD Follow up on 11/04/2023.   Specialty: Family Medicine Why: 10:20 am for hospital follow up Contact information: [address] [place]  [address] [phone]         [facility] Follow up on 11/08/2023.   Specialty: Cardiology Why: at  9:30 Contact information: [address] [place] [address] [phone]                 The results of significant diagnostics from this hospitalization (including imaging, microbiology, ancillary and laboratory) are listed below for reference.    Significant Diagnostic Studies: DG Swallowing Func-Speech Pathology Result Date: 10/28/2023 Table formatting from the original result was not included. Modified Barium Swallow Study Patient Details Name: SAHIB PELLA MRN: [id number] Date of Birth: February 21, 1951 Today's Date: 10/28/2023 HPI/PMH: HPI: Dmarion Perfect  is a 73 y.o. male, with history of ischemic cardiomyopathy, chronic combined systolic and diastolic CHF EF 52% on last echocardiogram, COPD, CAD, GERD, history of esophageal cancer, hx of PEG in 2018, prostate cancer, remote history of alcohol abuse quit in 2001, chronic iron deficiency anemia, hypertension, dyslipidemia, CKD stage IIIa baseline creatinine around 1.4, history of PE on Eliquis. Per chart pt diagnosed with community-acquired pneumonia about 2 weeks ago. Admitted with SOB, orthopnea over  the last 2 to 3 days, he presented to [facility] where he had a CT angiogram of the chest, and diagnosed with acute on chronic CHF. CT chest Atelectasis in the lung bases is likely compressive but could indicate pneumonia. MBS in 2018 severe oropharyngeal dysphagia, silent aspiration thin liquids but clear with vollitional cough, regular/thin liq recommended. Clinical Impression: Clinical Impression: MBS completed for pt with history of esophageal cancer s/p radiation in 2015. He demonstrates significant pharyngoesophageal dysphagia with primary impairment of decreased PES opening in addition to decreased anterior hyoid excursion, decreased laryngeal elevation and glottal  closure. Impairments resulted in aspiration of thin, nectar and honey thick liquids with immediate and delayed coughs. Maximum vallecular and pyriform sinues residue with solid textures and moderate with thin and mild amounts entering the PES with each swallow. Solid did not enter airway and puree resulted in flash penetration (PAS 2). Multiple strategies attempted with thin including chin tuck, left/right head turn, reclined position, posterior head tilt and Mendelsohn were ineffective. When pt was cued to tuck chin, hold breath, swallow and produce hard throat clear with thin this led to brief penetration that was consistently ejected by throat clear and pt consistently demonstrated accurate use of strategies. Esophageal scan revealed  stasis mid esophagus. Pt is suspected to be a chronic aspirator and will continue to be at risk however recommendation of regular texture, thin liquids following strategies mitigated risk during MBS. Recommend meds crushed and cup sips. ST will continue to follow while on acute and trial therapy to focus on PES opening (Shakir technique). Factors that may increase risk of adverse event in presence of aspiration Rubye Oaks & Clearance Coots 2021): Factors that may increase risk of adverse event in presence of aspiration Rubye Oaks & Clearance Coots 2021): Respiratory or GI disease; Frequent aspiration of large volumes (COPD) Recommendations/Plan: Swallowing Evaluation Recommendations Swallowing Evaluation Recommendations Recommendations: PO diet PO Diet Recommendation: Regular; Thin liquids (Level 0) Liquid Administration via: Cup; No straw Medication Administration: Crushed with puree Supervision: Patient able to self-feed Swallowing strategies  : Slow rate; Small bites/sips; Chin tuck; Hold breath before and during swallow (supraglottic swallow); Hard cough after swallowing Postural changes: Stay upright 30-60 min after meals; Position pt fully upright for meals Oral care recommendations: Oral care BID  (2x/day) Treatment Plan Treatment Plan Treatment recommendations: Therapy as outlined in treatment plan below Follow-up recommendations: Follow physicians's recommendations for discharge plan and follow up therapies Functional status assessment: Patient has had a recent decline in their functional status and demonstrates the ability to make significant improvements in function in a reasonable and predictable amount of time. Treatment frequency: Min 2x/week Treatment duration: 2 weeks Interventions: Aspiration precaution training; Compensatory techniques; Patient/family education; Diet toleration management by SLP Recommendations Recommendations for follow up therapy are one component of a multi-disciplinary discharge planning process, led by the attending physician.  Recommendations may be updated based on patient status, additional functional criteria and insurance authorization. Assessment: Orofacial Exam: Orofacial Exam Oral Cavity: Oral Hygiene: WFL Oral Cavity - Dentition: Other (Comment) (dentures not donned during MBS) Orofacial Anatomy: WFL Oral Motor/Sensory Function: WFL Anatomy: Anatomy: Suspected cervical osteophytes Boluses Administered: Boluses Administered Boluses Administered: Thin liquids (Level 0); Mildly thick liquids (Level 2, nectar thick); Moderately thick liquids (Level 3, honey thick); Puree; Solid  Oral Impairment Domain: Oral Impairment Domain Lip Closure: No labial escape Tongue control during bolus hold: Cohesive bolus between tongue to palatal seal Bolus preparation/mastication: Slow prolonged chewing/mashing with complete recollection (likley due to dentures not present) Bolus transport/lingual  motion: Brisk tongue motion Oral residue: Complete oral clearance Location of oral residue : N/A Initiation of pharyngeal swallow : Valleculae  Pharyngeal Impairment Domain: Pharyngeal Impairment Domain Soft palate elevation: No bolus between soft palate (SP)/pharyngeal wall (PW) Laryngeal  elevation: Partial superior movement of thyroid cartilage/partial approximation of arytenoids to epiglottic petiole Anterior hyoid excursion: Partial anterior movement Epiglottic movement: Partial inversion Laryngeal vestibule closure: Incomplete, narrow column air/contrast in laryngeal vestibule Pharyngeal stripping wave : Present - diminished Pharyngeal contraction (A/P view only): N/A Pharyngoesophageal segment opening: Minimal distention/minimal duration, marked obstruction of flow Tongue base retraction: No contrast between tongue base and posterior pharyngeal wall (PPW) Pharyngeal residue: Majority of contrast within or on pharyngeal structures Location of pharyngeal residue: Valleculae; Pyriform sinuses  Esophageal Impairment Domain: Esophageal Impairment Domain Esophageal clearance upright position: Esophageal retention Pill: No data recorded Penetration/Aspiration Scale Score: Penetration/Aspiration Scale Score 1.  Material does not enter airway: Solid 2.  Material enters airway, remains ABOVE vocal cords then ejected out: Puree 7.  Material enters airway, passes BELOW cords and not ejected out despite cough attempt by patient: Thin liquids (Level 0); Mildly thick liquids (Level 2, nectar thick); Moderately thick liquids (Level 3, honey thick) Compensatory Strategies: Compensatory Strategies Compensatory strategies: Yes Multiple swallows: -- (moderately effective) Chin tuck: Ineffective (ineffective in isolation but effective in combination with other strategies) Ineffective Chin Tuck: Thin liquid (Level 0) Left head turn: Ineffective Ineffective Left Head Turn: Thin liquid (Level 0) Right head turn: Ineffective Ineffective Right Head Turn: Thin liquid (Level 0) Supraglottic swallow: Ineffective (effective in combination with other strategies) Ineffective Supraglottic Swallow: Thin liquid (Level 0) Mendelsohn : Ineffective Ineffective Mendelsohn: Thin liquid (Level 0) Reclining posture: Ineffective  Ineffective Reclining Posture: Thin liquid (Level 0) Posterior head tilt: Ineffective Ineffective Posterior head tilt: Thin liquid (Level 0)   General Information: Caregiver present: No  Diet Prior to this Study: Regular; Mildly thick liquids (Level 2, nectar thick) (SLP)   Temperature : Normal   Respiratory Status: WFL   Supplemental O2: None (Room air)   History of Recent Intubation: No  Behavior/Cognition: Alert; Cooperative; Pleasant mood Self-Feeding Abilities: Able to self-feed Baseline vocal quality/speech: Dysphonic Volitional Cough: Able to elicit Volitional Swallow: Able to elicit Exam Limitations: No limitations Goal Planning: Prognosis for improved oropharyngeal function: Fair Barriers to Reach Goals: Severity of deficits No data recorded No data recorded Consulted and agree with results and recommendations: Patient; Nurse; Physician Pain: Pain Assessment Pain Assessment: No/denies pain Faces Pain Scale: 0 End of Session: Start Time:SLP Start Time (ACUTE ONLY): 1155 Stop Time: SLP Stop Time (ACUTE ONLY): 1227 Time Calculation:SLP Time Calculation (min) (ACUTE ONLY): 32 min Charges: SLP Evaluations $ SLP Speech Visit: 1 Visit SLP Evaluations $BSS Swallow: 1 Procedure $MBS Swallow: 1 Procedure SLP visit diagnosis: SLP Visit Diagnosis: Dysphagia, pharyngoesophageal phase (R13.14) Past Medical History: Past Medical History: Diagnosis Date  Chronic combined systolic and diastolic CHF (congestive heart failure) (HCC)   followed by dr t. Duke Salvia  COPD (chronic obstructive pulmonary disease) (HCC)   Coronary artery disease cardiologist-- dr Chilton Si  per cardiac cath 01-05-2017  chronic total occlusion pLCx with collaterals, 99% severe calcified prox. to mid RCA, otherwise mild to moderate CAD (medically managed)  Esophageal cancer, stage IIIB [facility]) oncologist-  dr ennever/  dr moody  dx 2010  SCC Stage IIIB completed chemoradiation;  localized recurrent left piriform sinus 01/ 2015,  completed  concurrent chemoradiatoin 04/ 2015  GERD (gastroesophageal reflux disease)   09-07-2018   no issues  since Gtube removed 06/ 2019  History of alcohol abuse   quit 2001  History of cancer chemotherapy   2010;   2015  History of radiation therapy   10-19-2013 to  12-05-2013 pyriform sinus 69.96 Gy/63fx;   Radiation completed 2010 for esophageal cancer  History of seizure 2001  alcohol withdrawal  HOH (hard of hearing)   Hyperlipidemia   Hypertension   Iron deficiency anemia due to chronic blood loss 11/14/2021  Ischemic cardiomyopathy 12/2016  01-03-2017  echo,  ef 10-15%/   echo 05-06-2017 EF improved to 45-50%  Prostate cancer [facility]) urologist-  dr ottelin/  oncologist-- dr Kathrynn Running  dx 05-27-2018--- Stage T1c,  Gleason 4+3,  PSA 9.3--  scheduled for brachytherapy 09-23-2017  Renal cyst, left  Past Surgical History: Past Surgical History: Procedure Laterality Date  BIOPSY  12/18/2021  Procedure: BIOPSY;  Surgeon: Rachael Fee, MD;  Location: [facility];  Service: Gastroenterology;;  CARDIOVASCULAR STRESS TEST  10/09/09  normal nuclearr stress test, EF 57% Adolph Pollack)  CENTRAL LINE INSERTION  06/16/2021  Procedure: CENTRAL LINE INSERTION;  Surgeon: Dolores Patty, MD;  Location: [facility] INVASIVE CV LAB;  Service: Cardiovascular;;  ESOPHAGOGASTRODUODENOSCOPY (EGD) WITH PROPOFOL N/A 12/18/2021  Procedure: ESOPHAGOGASTRODUODENOSCOPY (EGD) WITH PROPOFOL;  Surgeon: Rachael Fee, MD;  Location: [facility];  Service: Gastroenterology;  Laterality: N/A;  IR GASTROSTOMY TUBE MOD SED  01/13/2017  IR GASTROSTOMY TUBE REMOVAL  02/03/2018  IR PATIENT EVAL TECH 0-60 MINS  03/25/2017  IR REMOVAL TUN ACCESS W/ PORT W/O FL MOD SED  01/15/2017  IR REPLACE G-TUBE SIMPLE WO FLUORO  01/13/2018  LARYNGOSCOPY N/A 09/15/2013  Procedure: LARYNGOSCOPY;  Surgeon: Christia Reading, MD;  Location: [facility] OR;  Service: ENT;  Laterality: N/A;  direct laryngoscopy with biopsy and esophagoscopy  RADIOACTIVE SEED IMPLANT N/A 09/23/2018  Procedure: RADIOACTIVE  SEED IMPLANT/BRACHYTHERAPY IMPLANT;  Surgeon: Ihor Gully, MD;  Location: [facility];  Service: Urology;  Laterality: N/A;  RIGHT HEART CATH N/A 06/16/2021  Procedure: RIGHT HEART CATH;  Surgeon: Dolores Patty, MD;  Location: [facility]VASIVE CV LAB;  Service: Cardiovascular;  Laterality: N/A;  RIGHT/LEFT HEART CATH AND CORONARY ANGIOGRAPHY N/A 01/05/2017  Procedure: Right/Left Heart Cath and Coronary Angiography;  Surgeon: Kathleene Hazel, MD;  Location: [facility] INVASIVE CV LAB;  Service: Cardiovascular;  Laterality: N/A;  TRANSTHORACIC ECHOCARDIOGRAM  05/06/2017  ef 45-50%,  grade 1 diastolic dysfunction/  AV severe calcified non coronary cusp with moderate regurg. , no stenosis (valve area per echo 01-05-2017 1.08cm^2)/  mild MR, TR, and PR/  mild LAE Royce Macadamia 10/28/2023, 2:41 PM  ECHOCARDIOGRAM COMPLETE Result Date: 10/27/2023    ECHOCARDIOGRAM REPORT   Patient Name:   ALMUS WOODHAM Date of Exam: 10/27/2023 Medical Rec #:  [id number]        Height:       66.0 in Accession #:    [id number]       Weight:       130.7 lb Date of Birth:  [birth year]        BSA:          1.669 m Patient Age:    72 years         BP:           142/84 mmHg Patient Gender: M                HR:           103 bpm. Exam Location:  Inpatient Procedure: 2D  Echo, Color Doppler, Cardiac Doppler and Intracardiac            Opacification Agent (Both Spectral and Color Flow Doppler were            utilized during procedure). Indications:    CHF  History:        Patient has prior history of Echocardiogram examinations, most                 recent 01/08/2023. Cardiomyopathy, CAD; COPD.  Sonographer:    Amy Chionchio Referring Phys: [id number] [place] LEE IMPRESSIONS  1. Left ventricular ejection fraction, by estimation, is 25 to 30%. The left ventricle has severely decreased function. The left ventricle demonstrates regional wall motion abnormalities (see scoring diagram/findings for description). Left ventricular  diastolic parameters are indeterminate.  2. Right ventricular systolic function is moderately reduced. The right ventricular size is normal. There is moderately elevated pulmonary artery systolic pressure. The estimated right ventricular systolic pressure is 47.5 mmHg.  3. Right atrial size was moderately dilated.  4. The mitral valve is abnormal. Mild mitral valve regurgitation. No evidence of mitral stenosis.  5. Tricuspid valve regurgitation is moderate.  6. The aortic valve is tricuspid. There is mild calcification of the aortic valve. Aortic valve regurgitation is moderate. Aortic valve sclerosis/calcification is present, without any evidence of aortic stenosis. Aortic regurgitation PHT measures 501 msec. Aortic valve area, by VTI measures 2.04 cm. Aortic valve mean gradient measures 5.0 mmHg. Aortic valve Vmax measures 1.56 m/s.  7. The inferior vena cava is dilated in size with <50% respiratory variability, suggesting right atrial pressure of 15 mmHg. FINDINGS  Left Ventricle: Left ventricular ejection fraction, by estimation, is 25 to 30%. The left ventricle has severely decreased function. The left ventricle demonstrates regional wall motion abnormalities. Definity contrast agent was given IV to delineate the left ventricular endocardial borders. Strain imaging was not performed. The left ventricular internal cavity size was normal in size. There is no left ventricular hypertrophy. Left ventricular diastolic parameters are indeterminate.  LV Wall Scoring: The inferior septum, entire inferior wall, posterior wall, and apex are akinetic. The antero-lateral wall and apical lateral segment are hypokinetic. Right Ventricle: The right ventricular size is normal. No increase in right ventricular wall thickness. Right ventricular systolic function is moderately reduced. There is moderately elevated pulmonary artery systolic pressure. The tricuspid regurgitant velocity is 2.85 m/s, and with an assumed right atrial  pressure of 15 mmHg, the estimated right ventricular systolic pressure is 47.5 mmHg. Left Atrium: Left atrial size was normal in size. Right Atrium: Right atrial size was moderately dilated. Pericardium: There is no evidence of pericardial effusion. Mitral Valve: The mitral valve is abnormal. There is moderate thickening of the mitral valve leaflet(s). Mild mitral valve regurgitation. No evidence of mitral valve stenosis. MV peak gradient, 2.9 mmHg. The mean mitral valve gradient is 2.0 mmHg. Tricuspid Valve: The tricuspid valve is normal in structure. Tricuspid valve regurgitation is moderate . No evidence of tricuspid stenosis. Aortic Valve: The aortic valve is tricuspid. There is mild calcification of the aortic valve. Aortic valve regurgitation is moderate. Aortic regurgitation PHT measures 501 msec. Aortic valve sclerosis/calcification is present, without any evidence of aortic stenosis. Aortic valve mean gradient measures 5.0 mmHg. Aortic valve peak gradient measures 9.7 mmHg. Aortic valve area, by VTI measures 2.04 cm. Pulmonic Valve: The pulmonic valve was normal in structure. Pulmonic valve regurgitation is mild. No evidence of pulmonic stenosis. Aorta: The aortic root is normal in size and  structure. Venous: The inferior vena cava is dilated in size with less than 50% respiratory variability, suggesting right atrial pressure of 15 mmHg. IAS/Shunts: No atrial level shunt detected by color flow Doppler. Additional Comments: 3D imaging was not performed.  LEFT VENTRICLE PLAX 2D LVIDd:         5.30 cm      Diastology LVIDs:         5.10 cm      LV e' lateral:   11.10 cm/s LV PW:         0.80 cm      LV E/e' lateral: 7.2 LV IVS:        0.90 cm LVOT diam:     2.20 cm LV SV:         45 LV SV Index:   27 LVOT Area:     3.80 cm  LV Volumes (MOD) LV vol d, MOD A2C: 173.0 ml LV vol d, MOD A4C: 152.0 ml LV vol s, MOD A2C: 126.0 ml LV vol s, MOD A4C: 130.0 ml LV SV MOD A2C:     47.0 ml LV SV MOD A4C:     152.0 ml LV  SV MOD BP:      35.5 ml RIGHT VENTRICLE            IVC RV Basal diam:  3.90 cm    IVC diam: 2.20 cm RV S prime:     6.31 cm/s TAPSE (M-mode): 1.0 cm LEFT ATRIUM             Index        RIGHT ATRIUM           Index LA Vol (A2C):   76.7 ml 45.95 ml/m  RA Area:     23.90 cm LA Vol (A4C):   46.4 ml 27.80 ml/m  RA Volume:   86.80 ml  52.00 ml/m LA Biplane Vol: 61.7 ml 36.96 ml/m  AORTIC VALVE                     PULMONIC VALVE AV Area (Vmax):    2.03 cm      PV Vmax:          0.61 m/s AV Area (Vmean):   2.04 cm      PV Peak grad:     1.5 mmHg AV Area (VTI):     2.04 cm      PR End Diast Vel: 11.02 msec AV Vmax:           156.00 cm/s AV Vmean:          102.000 cm/s AV VTI:            0.220 m AV Peak Grad:      9.7 mmHg AV Mean Grad:      5.0 mmHg LVOT Vmax:         83.30 cm/s LVOT Vmean:        54.800 cm/s LVOT VTI:          0.118 m LVOT/AV VTI ratio: 0.54 AI PHT:            501 msec  AORTA Ao Root diam: 2.80 cm Ao Asc diam:  3.30 cm MITRAL VALVE               TRICUSPID VALVE MV Area (PHT): 4.89 cm    TR Peak grad:   32.5 mmHg MV Area VTI:   1.86 cm    TR Vmax:  285.00 cm/s MV Peak grad:  2.9 mmHg MV Mean grad:  2.0 mmHg    SHUNTS MV Vmax:       0.86 m/s    Systemic VTI:  0.12 m MV Vmean:      68.0 cm/s   Systemic Diam: 2.20 cm MV E velocity: 79.90 cm/s MV A velocity: 70.00 cm/s MV E/A ratio:  1.14 Arvilla Meres MD Electronically signed by Arvilla Meres MD Signature Date/Time: 10/27/2023/3:20:16 PM    Final    CT Angio Chest PE W and/or Wo Contrast Result Date: 10/25/2023 CLINICAL DATA:  Pulmonary embolus suspected with high probability. Shortness of breath for 2 weeks. Recent diagnosis of pneumonia and finished antibiotics 2 weeks ago. EXAM: CT ANGIOGRAPHY CHEST WITH CONTRAST TECHNIQUE: Multidetector CT imaging of the chest was performed using the standard protocol during bolus administration of intravenous contrast. Multiplanar CT image reconstructions and MIPs were obtained to evaluate the  vascular anatomy. RADIATION DOSE REDUCTION: This exam was performed according to the departmental dose-optimization program which includes automated exposure control, adjustment of the mA and/or kV according to patient size and/or use of iterative reconstruction technique. CONTRAST:  75mL OMNIPAQUE IOHEXOL 350 MG/ML SOLN COMPARISON:  Chest radiograph 10/25/2023.  CT chest 06/11/2021 FINDINGS: Cardiovascular: Technically adequate study with good opacification of the central and segmental pulmonary arteries. Basilar pulmonary arteries are not well opacified, likely due to early bolus technique. No definitive focal filling defects are demonstrated to suggest acute pulmonary embolus. Cardiac enlargement. Reflux of contrast material into the hepatic veins likely indicating right heart failure. No pericardial effusions. Normal caliber thoracic aorta. Calcification of the aorta and coronary arteries. Mediastinum/Nodes: Thyroid gland is unremarkable. Esophagus is decompressed. No significant lymphadenopathy. Lungs/Pleura: Moderate bilateral pleural effusions with basilar atelectasis. Diffuse interstitial infiltrates in the lungs likely representing interstitial edema. No pneumothorax. Upper Abdomen: No acute abnormalities. Musculoskeletal: No acute bony abnormalities. Review of the MIP images confirms the above findings. IMPRESSION: 1. No definitive evidence of acute pulmonary embolus. Non opacification of the basilar pulmonary arteries is likely technical. 2. Cardiac enlargement with evidence of right heart failure. 3. Moderate bilateral pleural effusions. 4. Interstitial edema diffusely. 5. Atelectasis in the lung bases is likely compressive but could indicate pneumonia. 6. Aortic atherosclerosis. Electronically Signed   By: Burman Nieves M.D.   On: 10/25/2023 22:46   DG Chest Port 1 View Result Date: 10/25/2023 CLINICAL DATA:  Shortness of breath x2 weeks, history of CHF, recent pneumonia EXAM: PORTABLE CHEST 1  VIEW COMPARISON:  10/15/2023 FINDINGS: Mild interstitial edema, lower lobe predominant. Small bilateral pleural effusions, right greater than left. No pneumothorax. The heart is top-normal in size.  Thoracic aortic atherosclerosis. IMPRESSION: Mild interstitial edema. Small bilateral pleural effusions, right greater than left. Electronically Signed   By: Charline Bills M.D.   On: 10/25/2023 21:48   DG Chest 2 View Result Date: 10/05/2023 CLINICAL DATA:  Cough, hemoptysis EXAM: CHEST - 2 VIEW COMPARISON:  09/02/2022 FINDINGS: Heart and mediastinal contours within normal limits. Peribronchial thickening and interstitial prominence, increasing since prior study. There are small bilateral pleural effusions with bibasilar airspace opacities. No acute bony abnormality. IMPRESSION: Interstitial prominence throughout the lungs could reflect edema or atypical infection. Small bilateral effusions with bibasilar atelectasis or infiltrates. Electronically Signed   By: Charlett Nose M.D.   On: 10/05/2023 12:34    Microbiology: Recent Results (from the past 240 hours)  Resp panel by RT-PCR (RSV, Flu A&B, Covid) Anterior Nasal Swab     Status: None  Collection Time: 10/25/23  7:46 PM   Specimen: Anterior Nasal Swab  Result Value Ref Range Status   SARS Coronavirus 2 by RT PCR NEGATIVE NEGATIVE Final    Comment: (NOTE) SARS-CoV-2 target nucleic acids are NOT DETECTED.  The SARS-CoV-2 RNA is generally detectable in upper respiratory specimens during the acute phase of infection. The lowest concentration of SARS-CoV-2 viral copies this assay can detect is 138 copies/mL. A negative result does not preclude SARS-Cov-2 infection and should not be used as the sole basis for treatment or other patient management decisions. A negative result may occur with  improper specimen collection/handling, submission of specimen other than nasopharyngeal swab, presence of viral mutation(s) within the areas targeted by this  assay, and inadequate number of viral copies(<138 copies/mL). A negative result must be combined with clinical observations, patient history, and epidemiological information. The expected result is Negative.  Fact Sheet for Patients:  BloggerCourse.com  Fact Sheet for Healthcare Providers:  SeriousBroker.it  This test is no t yet approved or cleared by the [place] FDA and  has been authorized for detection and/or diagnosis of SARS-CoV-2 by FDA under an Emergency Use Authorization (EUA). This EUA will remain  in effect (meaning this test can be used) for the duration of the COVID-19 declaration under Section 564(b)(1) of the Act, 21 U.S.C.section 360bbb-3(b)(1), unless the authorization is terminated  or revoked sooner.       Influenza A by PCR NEGATIVE NEGATIVE Final   Influenza B by PCR NEGATIVE NEGATIVE Final    Comment: (NOTE) The Xpert Xpress SARS-CoV-2/FLU/RSV plus assay is intended as an aid in the diagnosis of influenza from Nasopharyngeal swab specimens and should not be used as a sole basis for treatment. Nasal washings and aspirates are unacceptable for Xpert Xpress SARS-CoV-2/FLU/RSV testing.  Fact Sheet for Patients: BloggerCourse.com  Fact Sheet for Healthcare Providers: SeriousBroker.it  This test is not yet approved or cleared by the [place] FDA and has been authorized for detection and/or diagnosis of SARS-CoV-2 by FDA under an Emergency Use Authorization (EUA). This EUA will remain in effect (meaning this test can be used) for the duration of the COVID-19 declaration under Section 564(b)(1) of the Act, 21 U.S.C. section 360bbb-3(b)(1), unless the authorization is terminated or revoked.     Resp Syncytial Virus by PCR NEGATIVE NEGATIVE Final    Comment: (NOTE) Fact Sheet for Patients: BloggerCourse.com  Fact Sheet for  Healthcare Providers: SeriousBroker.it  This test is not yet approved or cleared by the [place] FDA and has been authorized for detection and/or diagnosis of SARS-CoV-2 by FDA under an Emergency Use Authorization (EUA). This EUA will remain in effect (meaning this test can be used) for the duration of the COVID-19 declaration under Section 564(b)(1) of the Act, 21 U.S.C. section 360bbb-3(b)(1), unless the authorization is terminated or revoked.  Performed at [facility], [address] [place], [place] [address]      Labs: Basic Metabolic Panel: Recent Labs  Lab 10/25/23 1954 10/25/23 2054 10/26/23 0449 10/27/23 0328 10/28/23 0257 10/29/23 0311  NA 129*  --  131* 133* 132* 132*  K 5.4* 5.4* 4.8 5.0 4.1 3.9  CL 95*  --  96* 94* 89* 88*  CO2 25  --  26 26 28 31   GLUCOSE 119*  --  110* 110* 149* 136*  BUN 25*  --  26* 30* 34* 29*  CREATININE 1.43*  --  1.45* 1.69* 1.44* 1.38*  CALCIUM 9.8  --  9.2 9.1  8.7* 8.7*  MG  --   --  2.3 2.3  --   --   PHOS  --   --   --  4.8*  --   --    Liver Function Tests: Recent Labs  Lab 10/25/23 1954 10/27/23 0328  AST 100* 131*  ALT 284* 282*  ALKPHOS 58 58  BILITOT 0.4 0.5  PROT 8.3* 7.3  ALBUMIN 4.2 3.3*   No results for input(s): "LIPASE", "AMYLASE" in the last 168 hours. No results for input(s): "AMMONIA" in the last 168 hours. CBC: Recent Labs  Lab 10/25/23 1946 10/26/23 0449 10/27/23 0328 10/28/23 0257  WBC 5.0 5.0 4.8 5.2  NEUTROABS 2.9 2.9 2.9  --   HGB 10.9* 10.0* 10.3* 10.4*  HCT 38.4* 34.0* 35.1* 35.6*  MCV 78.4* 75.6* 76.0* 75.7*  PLT 248 243 250 268   Cardiac Enzymes: No results for input(s): "CKTOTAL", "CKMB", "CKMBINDEX", "TROPONINI" in the last 168 hours. BNP: BNP (last 3 results) Recent Labs    10/25/23 1945 10/27/23 0328  BNP 2,130.3* 3,497.7*    ProBNP (last 3 results) No results for input(s): "PROBNP" in the last 8760 hours.  CBG: No  results for input(s): "GLUCAP" in the last 168 hours.     Signed:  Zannie Cove MD.  [facility] 10/29/2023, 11:35 AM

## 2023-10-29 NOTE — Care Management Important Message (Signed)
 Important Message  Patient Details  Name: Collin Young MRN: [id number] Date of Birth: September 29, 1950   Important Message Given:  Yes - Medicare IM     Renie Ora 10/29/2023, 11:42 AM

## 2023-10-29 NOTE — Progress Notes (Signed)
 Speech Language Pathology Treatment: Dysphagia  Patient Details Name: Collin Young MRN: [id number] DOB: 06/23/1951 Today's Date: 10/29/2023 Time: 4098-1191 SLP Time Calculation (min) (ACUTE ONLY): 16 min  Assessment / Plan / Recommendation Clinical Impression  Pt seen for part of breakfast meal. He was able to state all 4 swallow strategies recommended from yesterday's MBS. He was able to perform with therapist reminding him to make sure he holds his breath tight before swallowing. He had one episode of throat clearing not related to his strategy and an instance of likely aspiration when performing strategies and said he took a larger sip. All other trials there were no signs of aspiration. Observed with pill whole in applesauce and he had throat clear with globus sensation. Pt states he crushes the larger pills at home and SLP asked RN to crush bigger pills if able. Pt has chronic dysphagia and will have episodes of penetration/aspiration due to his esophageal dysphagia and radiation. Strategies in place that mitigated pt's risk. Taught pt the Shakir technique which facilitates upper esophageal opening. Pt transferred from chair to bed and with verbal and visual demonstration able to perform 3 sets of 10 repetitions. Wrote instructions for pt on same paper as swallow strategies. Pt may be leaving today. Instructed to continue all recommendations at home.    HPI HPI: Collin Young  is a 73 y.o. male, with history of ischemic cardiomyopathy, chronic combined systolic and diastolic CHF EF 47% on last echocardiogram, COPD, CAD, GERD, history of esophageal cancer, hx of PEG in 2018, prostate cancer, remote history of alcohol abuse quit in 2001, chronic iron deficiency anemia, hypertension, dyslipidemia, CKD stage IIIa baseline creatinine around 1.4, history of PE on Eliquis. Per chart pt diagnosed with community-acquired pneumonia about 2 weeks ago. Admitted with SOB, orthopnea over the last 2 to 3 days,  he presented to [facility] where he had a CT angiogram of the chest, and diagnosed with acute on chronic CHF. CT chest Atelectasis in the lung bases is likely compressive but could indicate pneumonia. MBS in 2018 severe oropharyngeal dysphagia, silent aspiration thin liquids but clear with vollitional cough, regular/thin liq recommended.      SLP Plan  Continue with current plan of care      Recommendations for follow up therapy are one component of a multi-disciplinary discharge planning process, led by the attending physician.  Recommendations may be updated based on patient status, additional functional criteria and insurance authorization.    Recommendations  Diet recommendations: Regular;Thin liquid Liquids provided via: Cup;No straw Medication Administration: Crushed with puree (if big) Compensations: Slow rate;Small sips/bites;Chin tuck (chin tuck, breath hold, swallow then hard throat clear) Postural Changes and/or Swallow Maneuvers: Hold breath before and during swallow (Supraglottic swallow);Chin tuck (chin tuck, hold breath, swallow, hard throat clear)                  Oral care BID   Intermittent Supervision/Assistance Dysphagia, pharyngoesophageal phase (R13.14)     Continue with current plan of care     Royce Macadamia  10/29/2023, 9:42 AM

## 2023-10-29 NOTE — Plan of Care (Signed)
  Problem: Education: Goal: Knowledge of General Education information will improve Description: Including pain rating scale, medication(s)/side effects and non-pharmacologic comfort measures Outcome: Adequate for Discharge   

## 2023-10-30 NOTE — Progress Notes (Unsigned)
 [facility] [address] [place], [place] [address] [phone]  Date:  11/04/2023   Name:  Collin Young   DOB:  03-Jun-1951   MRN:  [id number]  PCP:  Pearline Cables, MD    Chief Complaint: No chief complaint on file.   History of Present Illness:  Collin Young is a 73 y.o. very pleasant male patient who presents with the following:  Patient seen today for hospital follow-up-I saw him most recently on February 5. He then was admitted with CHF exacerbation  Admit date: 10/25/2023 Discharge date: 10/29/2023  Recommendations for Outpatient Follow-up:  Advanced heart failure clinic in 2 weeks Has severe dysphagia, needs to follow strict aspiration precautions  Discharge Diagnoses:  Principal Problem:   Acute on chronic congestive heart failure (HCC) Biventricular failure Severe dysphagia Esophageal CA   GERD without esophagitis   Mixed hyperlipidemia   Ischemic cardiomyopathy   Malignant neoplasm of prostate (HCC)   Coronary artery disease involving native coronary artery of native heart   Chronic kidney disease, stage 3a (HCC)   Late latent syphilis   Pulmonary embolism (HCC)   Acute on chronic systolic CHF (congestive heart failure) (HCC)   COPD (chronic obstructive pulmonary disease) (HCC)   Hyponatremia   Iron deficiency anemia due to chronic blood loss History of present illness:  72/M with chronic combined CHF, multivessel CAD on medical management, esophageal CA, remote EtOH abuse, CKD 3 AA, history of PE on Eliquis few weeks ago was diagnosed with pneumonia, treated with antibiotics, briefly improved subsequently started developing shortness of breath and orthopnea X 3 days presented to [facility] ED. CTA chest with no PE, moderate bilateral pleural effusions, interstitial edema Hospital Course:  Acute on chronic biventricular failure -Last echo 5/24 with EF 35-40%, inferior akinesis, mild  MR -repeat ECHO with EF 25-30%, mod reduced RV and WMA -Diuresed with IV Lasix, volume status has improved, transitioned to oral Lasix, continue [employer]  -Cards following, reviewed previous cath films, doubtful of targets for PCI, no plan to pursue LHC at this time -Follow-up with advanced heart failure clinic History of PE -Continue Eliquis, current CTA negative AKI on CKD 3a -Improving Severe dysphagia -Likely related to history of esophageal CA and radiation -SLP eval completed, significant risk noted, regardless of diet consistencies, felt to be a chronic aspirator, they recommended regular diet with thin liquids and strict aspiration precautions -Able to compensate for the most part at this time History of esophageal, left pyriform sinus CA -Completed chemo and XRT in 2010 for esophageal CA -Completed therapy in 2015 for pyriform sinus CA -SLP eval completed, significant risk noted, regular diet with thin liquids recommended and strict aspiration precautions   Early stage prostate CA -Completed prostate brachytherapy -Followed by Dr. Myna Hidalgo   Chronic iron deficiency anemia Stable, givenIV iron x2 days   Patient Active Problem List   Diagnosis Date Noted   Acute on chronic congestive heart failure (HCC) 10/25/2023   Iron deficiency anemia due to chronic blood loss 11/14/2021   Acute on chronic systolic CHF (congestive heart failure) (HCC) 07/07/2021   COPD (chronic obstructive pulmonary disease) (HCC)    Hyponatremia    SOB (shortness of breath)    DNR (do not resuscitate)    Weakness generalized    Elevated LFTs    Pulmonary embolism (HCC) 06/11/2021   Acute on chronic heart failure (HCC) 06/02/2021   GERD without esophagitis 05/08/2021  Mixed hyperlipidemia 05/08/2021   Hypertensive urgency 05/08/2021   Elevated troponin level not due myocardial infarction 05/08/2021   Pure hypercholesterolemia 09/23/2020   Claudication in peripheral vascular disease (HCC)  01/25/2020   Late latent syphilis 01/15/2020   Coronary artery disease involving native coronary artery of native heart 07/05/2019   Chronic kidney disease, stage 3a (HCC) 07/05/2019   Combined systolic and diastolic heart failure (HCC) 09/26/2018   Malignant neoplasm of prostate (HCC) 06/17/2018   Medication management    Dysphagia    Palliative care by specialist    Ischemic cardiomyopathy    Congestive dilated cardiomyopathy (HCC)    Acute on chronic systolic congestive heart failure (HCC) 01/02/2017   Malignant neoplasm of pyriform sinus (HCC) 09/28/2013   Piriform sinus tumor 09/24/2013   NONSPECIFIC ABN FINDING RAD & OTH EXAM GI TRACT 05/14/2009    Past Medical History:  Diagnosis Date   Chronic combined systolic and diastolic CHF (congestive heart failure) (HCC)    followed by dr t. Duke Salvia   COPD (chronic obstructive pulmonary disease) (HCC)    Coronary artery disease cardiologist-- dr Chilton Si   per cardiac cath 01-05-2017  chronic total occlusion pLCx with collaterals, 99% severe calcified prox. to mid RCA, otherwise mild to moderate CAD (medically managed)   Esophageal cancer, stage IIIB [facility]) oncologist-  dr ennever/  dr moody   dx 2010  SCC Stage IIIB completed chemoradiation;  localized recurrent left piriform sinus 01/ 2015,  completed concurrent chemoradiatoin 04/ 2015   GERD (gastroesophageal reflux disease)    09-07-2018   no issues since Gtube removed 06/ 2019   History of alcohol abuse    quit 2001   History of cancer chemotherapy    2010;   2015   History of radiation therapy    10-19-2013 to  12-05-2013 pyriform sinus 69.96 Gy/74fx;   Radiation completed 2010 for esophageal cancer   History of seizure 2001   alcohol withdrawal   HOH (hard of hearing)    Hyperlipidemia    Hypertension    Iron deficiency anemia due to chronic blood loss 11/14/2021   Ischemic cardiomyopathy 12/2016   01-03-2017  echo,  ef 10-15%/   echo 05-06-2017 EF improved to  45-50%   Prostate cancer [facility]) urologist-  dr ottelin/  oncologist-- dr Kathrynn Running   dx 05-27-2018--- Stage T1c,  Gleason 4+3,  PSA 9.3--  scheduled for brachytherapy 09-23-2017   Renal cyst, left     Past Surgical History:  Procedure Laterality Date   BIOPSY  12/18/2021   Procedure: BIOPSY;  Surgeon: Rachael Fee, MD;  Location: [facility];  Service: Gastroenterology;;   CARDIOVASCULAR STRESS TEST  10/09/09   normal nuclearr stress test, EF 57% Adolph Pollack)   CENTRAL LINE INSERTION  06/16/2021   Procedure: CENTRAL LINE INSERTION;  Surgeon: Dolores Patty, MD;  Location: [facility] INVASIVE CV LAB;  Service: Cardiovascular;;   ESOPHAGOGASTRODUODENOSCOPY (EGD) WITH PROPOFOL N/A 12/18/2021   Procedure: ESOPHAGOGASTRODUODENOSCOPY (EGD) WITH PROPOFOL;  Surgeon: Rachael Fee, MD;  Location: [facility];  Service: Gastroenterology;  Laterality: N/A;   IR GASTROSTOMY TUBE MOD SED  01/13/2017   IR GASTROSTOMY TUBE REMOVAL  02/03/2018   IR PATIENT EVAL TECH 0-60 MINS  03/25/2017   IR REMOVAL TUN ACCESS W/ PORT W/O FL MOD SED  01/15/2017   IR REPLACE G-TUBE SIMPLE WO FLUORO  01/13/2018   LARYNGOSCOPY N/A 09/15/2013   Procedure: LARYNGOSCOPY;  Surgeon: Christia Reading, MD;  Location: [facility];  Service: ENT;  Laterality: N/A;  direct laryngoscopy with biopsy and esophagoscopy   RADIOACTIVE SEED IMPLANT N/A 09/23/2018   Procedure: RADIOACTIVE SEED IMPLANT/BRACHYTHERAPY IMPLANT;  Surgeon: Ihor Gully, MD;  Location: [facility];  Service: Urology;  Laterality: N/A;   RIGHT HEART CATH N/A 06/16/2021   Procedure: RIGHT HEART CATH;  Surgeon: Dolores Patty, MD;  Location: [facility] CV [facility];  Service: Cardiovascular;  Laterality: N/A;   RIGHT/LEFT HEART CATH AND CORONARY ANGIOGRAPHY N/A 01/05/2017   Procedure: Right/Left Heart Cath and Coronary Angiography;  Surgeon: Kathleene Hazel, MD;  Location: [facility] INVASIVE CV LAB;  Service: Cardiovascular;  Laterality: N/A;   TRANSTHORACIC  ECHOCARDIOGRAM  05/06/2017   ef 45-50%,  grade 1 diastolic dysfunction/  AV severe calcified non coronary cusp with moderate regurg. , no stenosis (valve area per echo 01-05-2017 1.08cm^2)/  mild MR, TR, and PR/  mild LAE    Social History   Tobacco Use   Smoking status: Former    Current packs/day: 0.00    Average packs/day: 1 pack/day for 48.8 years (48.8 ttl pk-yrs)    Types: Cigarettes    Start date: 11/08/1960    Quit date: 08/31/2009    Years since quitting: 14.1   Smokeless tobacco: Former    Types: Chew    Quit date: 09/01/1999  Vaping Use   Vaping status: Never Used  Substance Use Topics   Alcohol use: Not Currently    Alcohol/week: 0.0 standard drinks of alcohol    Comment: quit in 2001   Drug use: No    Comment: back in the day used cocaine,alcohol, marijuana    Family History  Problem Relation Age of Onset   Breast cancer Mother    Prostate cancer Neg Hx    Colon cancer Neg Hx    Pancreatic cancer Neg Hx     No Known Allergies  Medication list has been reviewed and updated.  Current Outpatient Medications on File Prior to Visit  Medication Sig Dispense Refill   albuterol (VENTOLIN HFA) 108 (90 Base) MCG/ACT inhaler Inhale 2 puffs into the lungs every 6 (six) hours as needed for wheezing or shortness of breath. 18 g 5   dapagliflozin propanediol (FARXIGA) 10 MG TABS tablet Take 1 tablet (10 mg total) by mouth daily. 90 tablet 3   ELIQUIS 5 MG TABS tablet TAKE 1 TABLET(5 MG) BY MOUTH TWICE DAILY 60 tablet 5   Evolocumab (REPATHA SURECLICK) 140 MG/ML SOAJ Inject 140 mg into the skin every 14 (fourteen) days. Please keep your upcoming appointment for refills. 6 mL 3   fentaNYL (DURAGESIC) 12 MCG/HR Place 1 patch onto the skin every 3 (three) days. 10 patch 0   furosemide (LASIX) 40 MG tablet Take 1 tablet (40 mg total) by mouth daily. 30 tablet 1   ipratropium (ATROVENT) 0.03 % nasal spray Place 2 sprays into both nostrils every 12 (twelve) hours. 30 mL 12    polyethylene glycol (MIRALAX / GLYCOLAX) 17 g packet Take 17 g by mouth daily as needed for mild constipation. 14 each 0   pravastatin (PRAVACHOL) 80 MG tablet TAKE 1 TABLET(80 MG) BY MOUTH EVERY EVENING 90 tablet 3   sacubitril-valsartan (ENTRESTO) 24-26 MG Take 1 tablet by mouth 2 (two) times daily. 60 tablet 0   spironolactone (ALDACTONE) 25 MG tablet Take 0.5 tablets (12.5 mg total) by mouth daily.     tadalafil (CIALIS) 20 MG tablet Take 0.5-1 tablets (10-20 mg total) by mouth every other day as needed for erectile  dysfunction. 10 tablet 11   umeclidinium bromide (INCRUSE ELLIPTA) 62.5 MCG/ACT AEPB Inhale 1 puff into the lungs daily. (Patient not taking: Reported on 10/26/2023) 30 each 6   Current Facility-Administered Medications on File Prior to Visit  Medication Dose Route Frequency Provider Last Rate Last Admin   topical emolient (BIAFINE) emulsion   Topical Daily Dorothy Puffer, MD   Given at 01/19/14 9604    Review of Systems:  As per HPI- otherwise negative.   Physical Examination: There were no vitals filed for this visit. There were no vitals filed for this visit. There is no height or weight on file to calculate BMI. Ideal Body Weight:    GEN: no acute distress. HEENT: Atraumatic, Normocephalic.  Ears and Nose: No external deformity. CV: RRR, No M/G/R. No JVD. No thrill. No extra heart sounds. PULM: CTA B, no wheezes, crackles, rhonchi. No retractions. No resp. distress. No accessory muscle use. ABD: S, NT, ND, +BS. No rebound. No HSM. EXTR: No c/c/e PSYCH: Normally interactive. Conversant.    Assessment and Plan: ***  Signed Abbe Amsterdam, MD

## 2023-10-30 NOTE — Patient Instructions (Incomplete)
It was great to see you again today!  

## 2023-11-01 ENCOUNTER — Telehealth: Payer: Self-pay | Admitting: *Deleted

## 2023-11-01 NOTE — Transitions of Care (Post Inpatient/ED Visit) (Signed)
 11/01/2023  Name: Collin Young MRN: [id number] DOB: 1951/05/04  Today's TOC FU Call Status: Today's TOC FU Call Status:: Successful TOC FU Call Completed TOC FU Call Complete Date: 11/01/23 Patient's Name and Date of Birth confirmed.  Transition Care Management Follow-up Telephone Call Date of Discharge: 10/29/23 Discharge Facility: [facility]) Type of Discharge: Inpatient Admission Primary Inpatient Discharge Diagnosis:: Acute on chronic congestive heart failure How have you been since you were released from the hospital?: Better (Much better since he got that fluid off) Any questions or concerns?: No  Items Reviewed: Did you receive and understand the discharge instructions provided?: Yes Medications obtained,verified, and reconciled?: Yes (Medications Reviewed) Any new allergies since your discharge?: Yes Dietary orders reviewed?: No Do you have support at home?: Yes People in Home: alone Name of Support/Comfort Primary Source: [name]  Medications Reviewed Today: Medications Reviewed Today     Reviewed by Luella Cook, RN (Case Manager) on 11/01/23 at 1022  Med List Status: <None>   Medication Order Taking? Sig Documenting Provider Last Dose Status Informant  albuterol (VENTOLIN HFA) 108 (90 Base) MCG/ACT inhaler [id number] Yes Inhale 2 puffs into the lungs every 6 (six) hours as needed for wheezing or shortness of breath. Copland, Gwenlyn Found, MD Taking Active Child  dapagliflozin propanediol (FARXIGA) 10 MG TABS tablet [id number] Yes Take 1 tablet (10 mg total) by mouth daily. Bensimhon, Bevelyn Buckles, MD Taking Active Child  ELIQUIS 5 MG TABS tablet [id number] Yes TAKE 1 TABLET(5 MG) BY MOUTH TWICE DAILY Bensimhon, Bevelyn Buckles, MD Taking Active Child  Evolocumab [facility] SURECLICK) 140 MG/ML Ivory Broad [id number] Yes Inject 140 mg into the skin every 14 (fourteen) days. Please keep your upcoming appointment for refills. Alver Sorrow, NP Taking Active Child           Med  Note Merlene Morse, KIMBERLY L   Tue Oct 26, 2023 11:14 AM) Daughter not sure of his last dose  fentaNYL (DURAGESIC) 12 MCG/HR [id number] Yes Place 1 patch onto the skin every 3 (three) days. Josph Macho, MD Taking Active Child           Med Note Merlene Morse, [facility] L   Tue Oct 26, 2023 11:14 AM) Daughter states he has 1 on now  furosemide (LASIX) 40 MG tablet [id number] Yes Take 1 tablet (40 mg total) by mouth daily. Zannie Cove, MD Taking Active   ipratropium (ATROVENT) 0.03 % nasal spray [id number] Yes Place 2 sprays into both nostrils every 12 (twelve) hours. Copland, Gwenlyn Found, MD Taking Active Child           Med Note Merlene Morse, KIMBERLY L   Tue Oct 26, 2023 11:12 AM) prn  polyethylene glycol (MIRALAX / GLYCOLAX) 17 g packet [id number] Yes Take 17 g by mouth daily as needed for mild constipation. Alwyn Ren, MD Taking Active Child           Med Note Merlene Morse, [facility] L   Tue Oct 26, 2023 11:13 AM) prn  pravastatin (PRAVACHOL) 80 MG tablet [id number] Yes TAKE 1 TABLET(80 MG) BY MOUTH EVERY EVENING Copland, Gwenlyn Found, MD Taking Active Child  sacubitril-valsartan (ENTRESTO) 24-26 MG [id number]  Take 1 tablet by mouth 2 (two) times daily. Bensimhon, Bevelyn Buckles, MD  Active Child  spironolactone (ALDACTONE) 25 MG tablet [id number] Yes Take 0.5 tablets (12.5 mg total) by mouth daily. Zannie Cove, MD Taking Active   tadalafil (CIALIS) 20 MG tablet [id number] Yes Take 0.5-1 tablets (10-20 mg total) by mouth every other  day as needed for erectile dysfunction. Copland, Gwenlyn Found, MD Taking Active Child  umeclidinium bromide (INCRUSE ELLIPTA) 62.5 MCG/ACT AEPB [id number] No Inhale 1 puff into the lungs daily.  Patient not taking: Reported on 10/26/2023   Copland, Gwenlyn Found, MD Not Taking Active Child            Home Care and Equipment/Supplies: Were Home Health Services Ordered?: NA Any new equipment or medical supplies ordered?: NA  Functional Questionnaire: Do you need assistance  with bathing/showering or dressing?: No Do you need assistance with meal preparation?: No Do you need assistance with eating?: No Do you have difficulty maintaining continence: No Do you need assistance with getting out of bed/getting out of a chair/moving?: No Do you have difficulty managing or taking your medications?: No  Follow up appointments reviewed: PCP Follow-up appointment confirmed?: Yes Date of PCP follow-up appointment?: 11/04/23 Follow-up Provider: Dr Shanda Bumps [facility] Follow-up appointment confirmed?: Yes Date of Specialist follow-up appointment?: 11/08/23 Follow-Up Specialty Provider:: Cardiology Do you need transportation to your follow-up appointment?: No Do you understand care options if your condition(s) worsen?: Yes-patient verbalized understanding  SDOH Interventions Today    Flowsheet Row Most Recent Value  SDOH Interventions   Food Insecurity Interventions Intervention Not Indicated  Housing Interventions Intervention Not Indicated  Transportation Interventions Intervention Not Indicated, Patient Resources (Friends/Family)  Utilities Interventions Intervention Not Indicated      Patient daughter declined further outreach calls. Patient is hard of hearing and wears hearing aids.  Gean Maidens BSN RN [facility] Health Care Management Coordinator [email or web address] Direct Dial: [phone]  Fax: [phone] Website: [email or web address]

## 2023-11-02 ENCOUNTER — Other Ambulatory Visit: Payer: Self-pay

## 2023-11-02 ENCOUNTER — Ambulatory Visit (HOSPITAL_COMMUNITY)
Admission: RE | Admit: 2023-11-02 | Discharge: 2023-11-02 | Disposition: A | Discharge status: Home / Self Care | Source: Ambulatory Visit | Attending: Adult Health | Admitting: Adult Health

## 2023-11-02 VITALS — BP 146/90 | HR 108 | Ht 66.0 in | Wt 138.2 lb

## 2023-11-02 DIAGNOSIS — I5082 Biventricular heart failure: Secondary | ICD-10-CM | POA: Diagnosis not present

## 2023-11-02 DIAGNOSIS — Z9221 Personal history of antineoplastic chemotherapy: Secondary | ICD-10-CM | POA: Insufficient documentation

## 2023-11-02 DIAGNOSIS — Z87891 Personal history of nicotine dependence: Secondary | ICD-10-CM | POA: Diagnosis not present

## 2023-11-02 DIAGNOSIS — C61 Malignant neoplasm of prostate: Secondary | ICD-10-CM

## 2023-11-02 DIAGNOSIS — Z7901 Long term (current) use of anticoagulants: Secondary | ICD-10-CM | POA: Diagnosis not present

## 2023-11-02 DIAGNOSIS — I10 Essential (primary) hypertension: Secondary | ICD-10-CM | POA: Diagnosis not present

## 2023-11-02 DIAGNOSIS — R0609 Other forms of dyspnea: Secondary | ICD-10-CM | POA: Diagnosis not present

## 2023-11-02 DIAGNOSIS — I5040 Unspecified combined systolic (congestive) and diastolic (congestive) heart failure: Secondary | ICD-10-CM | POA: Diagnosis not present

## 2023-11-02 DIAGNOSIS — E785 Hyperlipidemia, unspecified: Secondary | ICD-10-CM | POA: Insufficient documentation

## 2023-11-02 DIAGNOSIS — I130 Hypertensive heart and chronic kidney disease with heart failure and stage 1 through stage 4 chronic kidney disease, or unspecified chronic kidney disease: Secondary | ICD-10-CM | POA: Insufficient documentation

## 2023-11-02 DIAGNOSIS — Z923 Personal history of irradiation: Secondary | ICD-10-CM | POA: Diagnosis not present

## 2023-11-02 DIAGNOSIS — I5042 Chronic combined systolic (congestive) and diastolic (congestive) heart failure: Secondary | ICD-10-CM | POA: Diagnosis not present

## 2023-11-02 DIAGNOSIS — Z8501 Personal history of malignant neoplasm of esophagus: Secondary | ICD-10-CM | POA: Insufficient documentation

## 2023-11-02 DIAGNOSIS — I2510 Atherosclerotic heart disease of native coronary artery without angina pectoris: Secondary | ICD-10-CM | POA: Diagnosis present

## 2023-11-02 DIAGNOSIS — I255 Ischemic cardiomyopathy: Secondary | ICD-10-CM | POA: Insufficient documentation

## 2023-11-02 DIAGNOSIS — J449 Chronic obstructive pulmonary disease, unspecified: Secondary | ICD-10-CM | POA: Diagnosis not present

## 2023-11-02 DIAGNOSIS — N1831 Chronic kidney disease, stage 3a: Secondary | ICD-10-CM

## 2023-11-02 DIAGNOSIS — C12 Malignant neoplasm of pyriform sinus: Secondary | ICD-10-CM

## 2023-11-02 LAB — BASIC METABOLIC PANEL WITH GFR
Anion gap: 12 (ref 5–15)
BUN: 30 mg/dL — ABNORMAL HIGH (ref 8–23)
CO2: 26 mmol/L (ref 22–32)
Calcium: 8.5 mg/dL — ABNORMAL LOW (ref 8.9–10.3)
Chloride: 90 mmol/L — ABNORMAL LOW (ref 98–111)
Creatinine, Ser: 1.42 mg/dL — ABNORMAL HIGH (ref 0.61–1.24)
GFR, Estimated: 53 mL/min — ABNORMAL LOW
Glucose, Bld: 149 mg/dL — ABNORMAL HIGH (ref 70–99)
Potassium: 4 mmol/L (ref 3.5–5.1)
Sodium: 128 mmol/L — ABNORMAL LOW (ref 135–145)

## 2023-11-02 LAB — BRAIN NATRIURETIC PEPTIDE: B Natriuretic Peptide: >4500 pg/mL — ABNORMAL HIGH (ref 0.0–100.0)

## 2023-11-02 MED ORDER — SPIRONOLACTONE 25 MG PO TABS: 12.5000 mg | ORAL_TABLET | Freq: Every day | ORAL | 3 refills | Status: AC

## 2023-11-02 MED ORDER — FUROSEMIDE 40 MG PO TABS
40.0000 mg | ORAL_TABLET | Freq: Two times a day (BID) | ORAL | 3 refills | Status: DC
Start: 2023-11-02 — End: 2023-11-08

## 2023-11-02 MED ORDER — FENTANYL 12 MCG/HR TD PT72
1.0000 | MEDICATED_PATCH | TRANSDERMAL | 0 refills | Status: DC
Start: 2023-11-02 — End: 2024-02-03

## 2023-11-02 NOTE — Progress Notes (Signed)
 ReDS Vest / Clip - 11/02/23 1230       ReDS Vest / Clip   Station Marker C    Ruler Value 24    ReDS Value Range High volume overload    ReDS Actual Value 45

## 2023-11-02 NOTE — Progress Notes (Signed)
 HF MD: Dr Gala Romney Chief Complaint: Heart Failure   HPI: Collin Young is a 73 y.o. male w/ chronic combined systolic and diastolic CHF/ ischemic CM, severe multivessel CAD treated medically, 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     HF dates back to 12/2016. Echo then w/ severely reduced LVEF 10-15%, RV mild-mod reduced, mod AI/MR/TR. LHC showed severe calcific disease in the proximal to mid RCA and chronic total occlusion of the proximal circumflex.There was consideration of RCA PCI with recommendations for medical management.    Admitted  9/22 with HF. Echo EF down 25-30%, RV not well visualized. Mod MR/AI. Chest CT showed  large right pleural effusion.  There was questionable PE on his CT but ultimately this was thought to be just artifact with poor opacification. Given w/ IV Lasix and discharged home on Entresto, Farxiga, Toprol XL and PRN lasix.    Admitted 06/11/21 with HF and acute hypoxic respiratory failure.Complicated by PE, pleural effusion, and cardiogenic shock. Diuresed with IV lasix.  Limited Echo EF 20%, global HK, RV normal.  RHC cardiogenic shock with R>L heart failure. GDMT titrated. Not felt to be a VAD candidate due to cachexia and RV failure. Discharged home, weight 121 lbs.    Admitted 07/07/21 with HF. Echo EF 25-30%, severe LV dysfunction, grade I DD, RV mildly reduced.  Diuresed with IV lasix and transitioned to po lasix 40 mg daily. Continued on GDMT, discharge weight 122 lbs.   Admitted 10/25/23 with A/C HFrEF. Diuresed with IV lasix and transitioned to lasix 40 mg po daily. Cath films reviewed. No targets for PCI. Plan for medical management. Placed entresto, farxiga, and spironolactone. Discharged 10/29/23. ? D/C weight 117? Not sure this was accurate given previous day was 128 pounds.   Today he returns for post hospital follow up. Overall feeling fine but thinks he is picking up  fluid.  Over the last couple of days he has noticed a cough at night. Denies PND. Appetite ok. Eating pork skins and neese liver pudding. No fever or chills. He has not been weighing at home. Taking all medications . His daughter helps him with his medications.   ROS: All systems negative except as listed in HPI, PMH and Problem List.  SH:  Social History   Socioeconomic History   Marital status: Legally Separated    Spouse name: Not on file   Number of children: 2   Years of education: Not on file   Highest education level: Not on file  Occupational History   Occupation: retired  Tobacco Use   Smoking status: Former    Current packs/day: 0.00    Average packs/day: 1 pack/day for 48.8 years (48.8 ttl pk-yrs)    Types: Cigarettes    Start date: 11/08/1960    Quit date: 08/31/2009    Years since quitting: 14.1   Smokeless tobacco: Former    Types: Chew    Quit date: 09/01/1999  Vaping Use   Vaping status: Never Used  Substance and Sexual Activity   Alcohol use: Not Currently    Alcohol/week: 0.0 standard drinks of alcohol    Comment: quit in 2001   Drug use: No    Comment: back in the day used cocaine,alcohol, marijuana   Sexual activity: Not Currently    Partners: Female    Birth control/protection: None  Other Topics Concern   Not on file  Social History Narrative  Not on file   Social Drivers of Health   Financial Resource Strain: Low Risk  (07/20/2023)   Overall Financial Resource Strain (CARDIA)    Difficulty of Paying Living Expenses: Not hard at all  Food Insecurity: No Food Insecurity (11/01/2023)   Hunger Vital Sign    Worried About Running Out of Food in the Last Year: Never true    Ran Out of Food in the Last Year: Never true  Transportation Needs: No Transportation Needs (11/01/2023)   PRAPARE - Administrator, Civil Service (Medical): No    Lack of Transportation (Non-Medical): No  Physical Activity: Inactive (07/20/2023)   Exercise Vital Sign     Days of Exercise per Week: 0 days    Minutes of Exercise per Session: 0 min  Stress: No Stress Concern Present (07/20/2023)   [employer] of Occupational Health - Occupational Stress Questionnaire    Feeling of Stress : Not at all  Social Connections: Moderately Integrated (10/26/2023)   Social Connection and Isolation Panel [NHANES]    Frequency of Communication with Friends and Family: More than three times a week    Frequency of Social Gatherings with Friends and Family: More than three times a week    Attends Religious Services: More than 4 times per year    Active Member of [employer] or Organizations: Yes    Attends Engineer, structural: More than 4 times per year    Marital Status: Divorced  Intimate Partner Violence: Not At Risk (11/01/2023)   Humiliation, Afraid, Rape, and Kick questionnaire    Fear of Current or Ex-Partner: No    Emotionally Abused: No    Physically Abused: No    Sexually Abused: No    FH:  Family History  Problem Relation Age of Onset   Breast cancer Mother    Prostate cancer Neg Hx    Colon cancer Neg Hx    Pancreatic cancer Neg Hx     Past Medical History:  Diagnosis Date   Chronic combined systolic and diastolic CHF (congestive heart failure) (HCC)    followed by dr t. Duke Salvia   COPD (chronic obstructive pulmonary disease) (HCC)    Coronary artery disease cardiologist-- dr Chilton Si   per cardiac cath 01-05-2017  chronic total occlusion pLCx with collaterals, 99% severe calcified prox. to mid RCA, otherwise mild to moderate CAD (medically managed)   Esophageal cancer, stage IIIB [facility] oncologist-  dr ennever/  dr moody   dx 2010  SCC Stage IIIB completed chemoradiation;  localized recurrent left piriform sinus 01/ 2015,  completed concurrent chemoradiatoin 04/ 2015   GERD (gastroesophageal reflux disease)    09-07-2018   no issues since Gtube removed 06/ 2019   History of alcohol abuse    quit 2001   History of cancer  chemotherapy    2010;   2015   History of radiation therapy    10-19-2013 to  12-05-2013 pyriform sinus 69.96 Gy/39fx;   Radiation completed 2010 for esophageal cancer   History of seizure 2001   alcohol withdrawal   HOH (hard of hearing)    Hyperlipidemia    Hypertension    Iron deficiency anemia due to chronic blood loss 11/14/2021   Ischemic cardiomyopathy 12/2016   01-03-2017  echo,  ef 10-15%/   echo 05-06-2017 EF improved to 45-50%   Prostate cancer [facility] urologist-  dr ottelin/  oncologist-- dr Kathrynn Running   dx 05-27-2018--- Stage T1c,  Gleason 4+3,  PSA 9.3--  scheduled for brachytherapy 09-23-2017   Renal cyst, left     Current Outpatient Medications  Medication Sig Dispense Refill   albuterol (VENTOLIN HFA) 108 (90 Base) MCG/ACT inhaler Inhale 2 puffs into the lungs every 6 (six) hours as needed for wheezing or shortness of breath. 18 g 5   dapagliflozin propanediol (FARXIGA) 10 MG TABS tablet Take 1 tablet (10 mg total) by mouth daily. 90 tablet 3   ELIQUIS 5 MG TABS tablet TAKE 1 TABLET(5 MG) BY MOUTH TWICE DAILY 60 tablet 5   Evolocumab (REPATHA SURECLICK) 140 MG/ML SOAJ Inject 140 mg into the skin every 14 (fourteen) days. Please keep your upcoming appointment for refills. 6 mL 3   fentaNYL (DURAGESIC) 12 MCG/HR Place 1 patch onto the skin every 3 (three) days. 10 patch 0   furosemide (LASIX) 40 MG tablet Take 1 tablet (40 mg total) by mouth daily. 30 tablet 1   ipratropium (ATROVENT) 0.03 % nasal spray Place 2 sprays into both nostrils every 12 (twelve) hours. 30 mL 12   pravastatin (PRAVACHOL) 80 MG tablet TAKE 1 TABLET(80 MG) BY MOUTH EVERY EVENING 90 tablet 3   sacubitril-valsartan (ENTRESTO) 24-26 MG Take 1 tablet by mouth 2 (two) times daily. 60 tablet 0   umeclidinium bromide (INCRUSE ELLIPTA) 62.5 MCG/ACT AEPB Inhale 1 puff into the lungs daily. 30 each 6   polyethylene glycol (MIRALAX / GLYCOLAX) 17 g packet Take 17 g by mouth daily as needed for mild constipation.  (Patient not taking: Reported on 11/02/2023) 14 each 0   spironolactone (ALDACTONE) 25 MG tablet Take 0.5 tablets (12.5 mg total) by mouth daily. (Patient not taking: Reported on 11/02/2023)     tadalafil (CIALIS) 20 MG tablet Take 0.5-1 tablets (10-20 mg total) by mouth every other day as needed for erectile dysfunction. (Patient not taking: Reported on 11/02/2023) 10 tablet 11   No current facility-administered medications for this encounter.   Facility-Administered Medications Ordered in Other Encounters  Medication Dose Route Frequency Provider Last Rate Last Admin   topical emolient (BIAFINE) emulsion   Topical Daily Dorothy Puffer, MD   Given at 01/19/14 0912    Vitals:   11/02/23 1230  BP: (!) 146/90  Pulse: (!) 108  SpO2: 95%  Weight: 62.7 kg (138 lb 3.2 oz)  Height: 5\' 6"  (1.676 m)   Wt Readings from Last 3 Encounters:  11/02/23 62.7 kg (138 lb 3.2 oz)  10/29/23 53.3 kg (117 lb 9.6 oz)  10/06/23 64.6 kg (142 lb 6.4 oz)    PHYSICAL EXAM: General:  No resp difficulty Neck: supple. JVP 9-10  Cor: PMI normal. Regular rate & rhythm. No rubs, gallops or murmurs. Lungs: LLL crackles  Abdomen: soft, nontender, nondistended. Extremities: no cyanosis, clubbing, rash, edema Neuro: alert & orientedx3   ASSESSMENT & PLAN: 1. Chronic Biventricular HF EF remained 20-30% from 2021 to 2023 when it improved to 40-45%.Echo (9/21): EF down again, 35-40%. RV normal  -Echo 10/2023 EF down 20-25% RV moderately reduced + WMA.  Dr Gala Romney  reviewed previous cath. No targets.  NYHA III. ReDs reading: 45 %, abnormal. Suggestive of fluid accumulation.  On exam he appears volume overloaded. Increase lasix 40 mg twice a day.  - No BB with recent decompensation.  Add spiro 12.5 mg daily  Continue entresto 24-26 mg twice a day  Continue farxiga 10 mg daily.  Check BMET and BNP   2. HTN  Elevated. Continue current regimen and will add spironolactone as noted above.  3. CAD Cath 2018 severe Prox  -Mid  CTO proximal circumflex. Mod left main and mid LAD.  No chest pain. Continue pravastatin and eliquis.   CKD Stage IIIa Creatinine baseline 1.2-1.3 Check BMET   Follow up in 2 weeks to reassess volume status and consider increasing spironolactone at that time.  Will need bmet at that time. I personally talked to his daughter by phone to discuss the plan.   Laquanna Veazey NP-C  12:41 PM

## 2023-11-02 NOTE — Patient Instructions (Addendum)
 Medication Changes:  START: SPIRONOLACTONE 12.5MG  ONCE DAILY   START: LASIX (FUROSEMIDE) 40MG  TWICE DAILY   Lab Work:  Labs done today, your results will be available in MyChart, we will contact you for abnormal readings.  Special Instructions // Education:  WEIGH YOUR SELF EVERY DAY AND WRITE THIS NUMBER DOWN BRING TO YOUR NEXT APPOINTMENT   Follow-Up in: 2 WEEKS AS SCHEDULED   At the [facility], you and your health needs are our priority. We have a designated team specialized in the treatment of Heart Failure. This Care Team includes your primary Heart Failure Specialized Cardiologist (physician), Advanced Practice Providers (APPs- Physician Assistants and Nurse Practitioners), and Pharmacist who all work together to provide you with the care you need, when you need it.   You may see any of the following providers on your designated Care Team at your next follow up:  Dr. Arvilla Meres Dr. Marca Ancona Dr. Dorthula Nettles Dr. Theresia Bough Tonye Becket, NP Robbie Lis, [place] [facility] [place], [place] Brynda Peon, NP [place] Lee, NP Karle Plumber, PharmD   Please be sure to bring in all your medications bottles to every appointment.   Need to Contact us:  If you have any questions or concerns before your next appointment please send [place] a message through [place] or call our office at [phone].    TO LEAVE A MESSAGE FOR THE NURSE SELECT OPTION 2, PLEASE LEAVE A MESSAGE INCLUDING: YOUR NAME DATE OF BIRTH CALL BACK NUMBER REASON FOR CALL**this is important as we prioritize the call backs  YOU WILL RECEIVE A CALL BACK THE SAME DAY AS LONG AS YOU CALL BEFORE 4:00 PM

## 2023-11-04 ENCOUNTER — Ambulatory Visit: Payer: 59 | Admitting: Family Medicine

## 2023-11-04 ENCOUNTER — Ambulatory Visit (HOSPITAL_COMMUNITY)
Admission: RE | Admit: 2023-11-04 | Discharge: 2023-11-04 | Disposition: A | Discharge status: Home / Self Care | Source: Ambulatory Visit | Attending: Adult Health | Admitting: Adult Health

## 2023-11-04 VITALS — BP 130/80 | HR 102 | Wt 136.5 lb

## 2023-11-04 VITALS — BP 142/80 | HR 118 | Temp 98.0°F | Resp 20 | Ht 66.0 in | Wt 139.4 lb

## 2023-11-04 DIAGNOSIS — I2510 Atherosclerotic heart disease of native coronary artery without angina pectoris: Secondary | ICD-10-CM

## 2023-11-04 DIAGNOSIS — I5023 Acute on chronic systolic (congestive) heart failure: Secondary | ICD-10-CM

## 2023-11-04 DIAGNOSIS — Z79899 Other long term (current) drug therapy: Secondary | ICD-10-CM | POA: Insufficient documentation

## 2023-11-04 DIAGNOSIS — R0602 Shortness of breath: Secondary | ICD-10-CM | POA: Diagnosis present

## 2023-11-04 DIAGNOSIS — I5082 Biventricular heart failure: Secondary | ICD-10-CM | POA: Insufficient documentation

## 2023-11-04 DIAGNOSIS — Z7901 Long term (current) use of anticoagulants: Secondary | ICD-10-CM | POA: Insufficient documentation

## 2023-11-04 DIAGNOSIS — Z87891 Personal history of nicotine dependence: Secondary | ICD-10-CM | POA: Diagnosis not present

## 2023-11-04 DIAGNOSIS — I5042 Chronic combined systolic (congestive) and diastolic (congestive) heart failure: Secondary | ICD-10-CM | POA: Diagnosis not present

## 2023-11-04 DIAGNOSIS — Z7984 Long term (current) use of oral hypoglycemic drugs: Secondary | ICD-10-CM | POA: Diagnosis not present

## 2023-11-04 DIAGNOSIS — J449 Chronic obstructive pulmonary disease, unspecified: Secondary | ICD-10-CM | POA: Insufficient documentation

## 2023-11-04 DIAGNOSIS — N1831 Chronic kidney disease, stage 3a: Secondary | ICD-10-CM | POA: Insufficient documentation

## 2023-11-04 DIAGNOSIS — I5043 Acute on chronic combined systolic (congestive) and diastolic (congestive) heart failure: Secondary | ICD-10-CM

## 2023-11-04 DIAGNOSIS — I130 Hypertensive heart and chronic kidney disease with heart failure and stage 1 through stage 4 chronic kidney disease, or unspecified chronic kidney disease: Secondary | ICD-10-CM | POA: Insufficient documentation

## 2023-11-04 DIAGNOSIS — R0609 Other forms of dyspnea: Secondary | ICD-10-CM

## 2023-11-04 DIAGNOSIS — I255 Ischemic cardiomyopathy: Secondary | ICD-10-CM | POA: Insufficient documentation

## 2023-11-04 DIAGNOSIS — E785 Hyperlipidemia, unspecified: Secondary | ICD-10-CM | POA: Insufficient documentation

## 2023-11-04 DIAGNOSIS — I10 Essential (primary) hypertension: Secondary | ICD-10-CM | POA: Diagnosis not present

## 2023-11-04 NOTE — Addendum Note (Signed)
 Encounter addended by: Noralee Space, RN on: 11/04/2023 2:02 PM  Actions taken: Clinical Note Signed

## 2023-11-04 NOTE — Addendum Note (Signed)
 Addended by: Abbe Amsterdam C on: 11/04/2023 09:15 PM   Modules accepted: Orders

## 2023-11-04 NOTE — Progress Notes (Signed)
 HF MD: Dr Gala Romney Chief Complaint: Heart Failure   HPI: Collin Young is a 73 y.o. male w/ chronic combined systolic and diastolic CHF/ ischemic CM, severe multivessel CAD treated medically, 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     HF dates back to 12/2016. Echo then w/ severely reduced LVEF 10-15%, RV mild-mod reduced, mod AI/MR/TR. LHC showed severe calcific disease in the proximal to mid RCA and chronic total occlusion of the proximal circumflex.There was consideration of RCA PCI with recommendations for medical management.    Admitted  9/22 with HF. Echo EF down 25-30%, RV not well visualized. Mod MR/AI. Chest CT showed  large right pleural effusion.  There was questionable PE on his CT but ultimately this was thought to be just artifact with poor opacification. Given w/ IV Lasix and discharged home on Entresto, Farxiga, Toprol XL and PRN lasix.    Admitted 06/11/21 with HF and acute hypoxic respiratory failure.Complicated by PE, pleural effusion, and cardiogenic shock. Diuresed with IV lasix.  Limited Echo EF 20%, global HK, RV normal.  RHC cardiogenic shock with R>L heart failure. GDMT titrated. Not felt to be a VAD candidate due to cachexia and RV failure. Discharged home, weight 121 lbs.    Admitted 07/07/21 with HF. Echo EF 25-30%, severe LV dysfunction, grade I DD, RV mildly reduced.  Diuresed with IV lasix and transitioned to po lasix 40 mg daily. Continued on GDMT, discharge weight 122 lbs.   Admitted 10/25/23 with A/C HFrEF. Diuresed with IV lasix and transitioned to lasix 40 mg po daily. Cath films reviewed. No targets for PCI. Plan for medical management. Placed entresto, farxiga, and spironolactone. Discharged 10/29/23. ? D/C weight 117? Not sure this was accurate given previous day was 128 pounds.   I saw him in the clinic 11/02/23. He was volume overloaded in the setting of high sodium diet. Lasix  increased to 40 mg twice a day and spiro was added. He was unable to pick up spiro at the pharmacy due to previous fill history.  Today he was seen by his PCP and was hypoxic with ambulation and short of breath with exertion. Despite that he denied feeling that bad. PCP requested office visit.   Today he returns for an acute HF follow up. Complaining of fatigue. SOB with exertion. + Orthopnea.  Denies PND. Appetite ok. No fever or chills. Weight at home 133 pounds. Taking all medications. He lives alone. His 2 daughters are very supportive and help him with his medications.   ROS: All systems negative except as listed in HPI, PMH and Problem List.  SH:  Social History   Socioeconomic History   Marital status: Legally Separated    Spouse name: Not on file   Number of children: 2   Years of education: Not on file   Highest education level: Not on file  Occupational History   Occupation: retired  Tobacco Use   Smoking status: Former    Current packs/day: 0.00    Average packs/day: 1 pack/day for 48.8 years (48.8 ttl pk-yrs)    Types: Cigarettes    Start date: 11/08/1960    Quit date: 08/31/2009    Years since quitting: 14.1   Smokeless tobacco: Former    Types: Chew    Quit date: 09/01/1999  Vaping Use   Vaping status: Never Used  Substance and Sexual Activity   Alcohol use: Not Currently  Alcohol/week: 0.0 standard drinks of alcohol    Comment: quit in 2001   Drug use: No    Comment: back in the day used cocaine,alcohol, marijuana   Sexual activity: Not Currently    Partners: Female    Birth control/protection: None  Other Topics Concern   Not on file  Social History Narrative   Not on file   Social Drivers of Health   Financial Resource Strain: Low Risk  (07/20/2023)   Overall Financial Resource Strain (CARDIA)    Difficulty of Paying Living Expenses: Not hard at all  Food Insecurity: No Food Insecurity (11/01/2023)   Hunger Vital Sign    Worried About Running Out of  Food in the Last Year: Never true    Ran Out of Food in the Last Year: Never true  Transportation Needs: No Transportation Needs (11/01/2023)   PRAPARE - Administrator, Civil Service (Medical): No    Lack of Transportation (Non-Medical): No  Physical Activity: Inactive (07/20/2023)   Exercise Vital Sign    Days of Exercise per Week: 0 days    Minutes of Exercise per Session: 0 min  Stress: No Stress Concern Present (07/20/2023)   [employer] of Occupational Health - Occupational Stress Questionnaire    Feeling of Stress : Not at all  Social Connections: Moderately Integrated (10/26/2023)   Social Connection and Isolation Panel [NHANES]    Frequency of Communication with Friends and Family: More than three times a week    Frequency of Social Gatherings with Friends and Family: More than three times a week    Attends Religious Services: More than 4 times per year    Active Member of [employer] or Organizations: Yes    Attends Engineer, structural: More than 4 times per year    Marital Status: Divorced  Intimate Partner Violence: Not At Risk (11/01/2023)   Humiliation, Afraid, Rape, and Kick questionnaire    Fear of Current or Ex-Partner: No    Emotionally Abused: No    Physically Abused: No    Sexually Abused: No    FH:  Family History  Problem Relation Age of Onset   Breast cancer Mother    Prostate cancer Neg Hx    Colon cancer Neg Hx    Pancreatic cancer Neg Hx     Past Medical History:  Diagnosis Date   Chronic combined systolic and diastolic CHF (congestive heart failure) (HCC)    followed by dr t. Duke Salvia   COPD (chronic obstructive pulmonary disease) (HCC)    Coronary artery disease cardiologist-- dr Chilton Si   per cardiac cath 01-05-2017  chronic total occlusion pLCx with collaterals, 99% severe calcified prox. to mid RCA, otherwise mild to moderate CAD (medically managed)   Esophageal cancer, stage IIIB [facility]) oncologist-  dr ennever/  dr  moody   dx 2010  SCC Stage IIIB completed chemoradiation;  localized recurrent left piriform sinus 01/ 2015,  completed concurrent chemoradiatoin 04/ 2015   GERD (gastroesophageal reflux disease)    09-07-2018   no issues since Gtube removed 06/ 2019   History of alcohol abuse    quit 2001   History of cancer chemotherapy    2010;   2015   History of radiation therapy    10-19-2013 to  12-05-2013 pyriform sinus 69.96 Gy/73fx;   Radiation completed 2010 for esophageal cancer   History of seizure 2001   alcohol withdrawal   HOH (hard of hearing)    Hyperlipidemia  Hypertension    Iron deficiency anemia due to chronic blood loss 11/14/2021   Ischemic cardiomyopathy 12/2016   01-03-2017  echo,  ef 10-15%/   echo 05-06-2017 EF improved to 45-50%   Prostate cancer [facility]) urologist-  dr ottelin/  oncologist-- dr Kathrynn Running   dx 05-27-2018--- Stage T1c,  Gleason 4+3,  PSA 9.3--  scheduled for brachytherapy 09-23-2017   Renal cyst, left     Current Outpatient Medications  Medication Sig Dispense Refill   albuterol (VENTOLIN HFA) 108 (90 Base) MCG/ACT inhaler Inhale 2 puffs into the lungs every 6 (six) hours as needed for wheezing or shortness of breath. 18 g 5   dapagliflozin propanediol (FARXIGA) 10 MG TABS tablet Take 1 tablet (10 mg total) by mouth daily. 90 tablet 3   ELIQUIS 5 MG TABS tablet TAKE 1 TABLET(5 MG) BY MOUTH TWICE DAILY 60 tablet 5   Evolocumab (REPATHA SURECLICK) 140 MG/ML SOAJ Inject 140 mg into the skin every 14 (fourteen) days. Please keep your upcoming appointment for refills. 6 mL 3   fentaNYL (DURAGESIC) 12 MCG/HR Place 1 patch onto the skin every 3 (three) days. 10 patch 0   furosemide (LASIX) 40 MG tablet Take 1 tablet (40 mg total) by mouth 2 (two) times daily. 60 tablet 3   ipratropium (ATROVENT) 0.03 % nasal spray Place 2 sprays into both nostrils every 12 (twelve) hours. 30 mL 12   polyethylene glycol (MIRALAX / GLYCOLAX) 17 g packet Take 17 g by mouth daily as  needed for mild constipation. 14 each 0   pravastatin (PRAVACHOL) 80 MG tablet TAKE 1 TABLET(80 MG) BY MOUTH EVERY EVENING 90 tablet 3   sacubitril-valsartan (ENTRESTO) 24-26 MG Take 1 tablet by mouth 2 (two) times daily. 60 tablet 0   tadalafil (CIALIS) 20 MG tablet Take 0.5-1 tablets (10-20 mg total) by mouth every other day as needed for erectile dysfunction. 10 tablet 11   umeclidinium bromide (INCRUSE ELLIPTA) 62.5 MCG/ACT AEPB Inhale 1 puff into the lungs daily. 30 each 6   spironolactone (ALDACTONE) 25 MG tablet Take 0.5 tablets (12.5 mg total) by mouth daily. 45 tablet 3   No current facility-administered medications for this encounter.   Facility-Administered Medications Ordered in Other Encounters  Medication Dose Route Frequency Provider Last Rate Last Admin   topical emolient (BIAFINE) emulsion   Topical Daily Dorothy Puffer, MD   Given at 01/19/14 0912    Vitals:   11/04/23 1140  BP: 130/80  Pulse: (!) 102  SpO2: 99%  Weight: 61.9 kg (136 lb 8 oz)    Wt Readings from Last 3 Encounters:  11/04/23 61.9 kg (136 lb 8 oz)  11/04/23 63.2 kg (139 lb 6.4 oz)  11/02/23 62.7 kg (138 lb 3.2 oz)    PHYSICAL EXAM: General:  Thin, Walked in the clinic.  Wearing layers of clothing.  Neck: supple. JVP 11-12  Cor: PMI nondisplaced. Regular rate & rhythm. No rubs, gallops LLSB 2/6  Lungs: Crackles in the bases on room air.  Abdomen: soft, nontender, nondistended.  Extremities: no cyanosis, clubbing, rash, edema Neuro: alert & oriented x3  ASSESSMENT & PLAN: 1. Acute on Chronic Biventricular HF EF remained 20-30% from 2021 to 2023 when it improved to 40-45%.Echo (9/21): EF down again, 35-40%. RV normal  -Echo 10/2023 EF down 20-25% RV moderately reduced + WMA.  Dr Gala Romney  reviewed previous cath. No targets.  ReDs reading: 49 %, abnormal. Volume overloaded. See below for diuretic plan.  - NYHA III Volume status  elevated.  FUROSCIX prescribed  Patient viewed patient education video  with QR code for Lenor Coffin code for FUROSCIX placed on AVS  Call FUROSCIX Direct at [phone] for questions regarding on body infuser.  Day 1 11/04/23 FUROSCIX 80 mg once daily  via on body infuser  Day 2  11/05/23 FUROSCIX 80 mg once daily  via on body  Day 3 3/8 /25  -Lasix 40 mg po twice a day.  - No BB with recent decompensation.  - Needs to pick spiro up. We called his pharmacy. Unfortunately due to previous 90 day fill he is unable to pick up. I have asked his daughter to verify his medications when she gets home.  -Continue entresto 24-26 mg twice a day  -Continue farxiga 10 mg daily.  - Follow up next week to reassess volume status.   2. HTN  Stable. Continue current regimen.   3. CAD Cath 2018 severe Prox -Mid  CTO proximal circumflex. Mod left main and mid LAD.  - No chest pain.  Continue pravastatin and eliquis.   4. CKD Stage IIIa Creatinine baseline 1.2-1.3  5. GOC He would benefit from outpatient Palliative Care given COPD/HF and declining condition.   Would benefit from [facility]. I reached out to PCP for [facility] orders.   Follow up next week for reassessment. If he gets worse his family will take him to the ED.     Samirah Scarpati NP-C  11:45 AM

## 2023-11-04 NOTE — Progress Notes (Signed)
 Provided patient education on Furoscix using demo kits and Furoscix video, QR code provided on AVS for further viewing. Furoscix order submitted online, ov note and ins card uploaded to [employer].

## 2023-11-04 NOTE — Progress Notes (Signed)
 ReDS Vest / Clip - 11/04/23 1100       ReDS Vest / Clip   Station Marker C    Ruler Value 26    ReDS Value Range High volume overload    ReDS Actual Value 49

## 2023-11-04 NOTE — Patient Instructions (Addendum)
 Medication Changes:  Your provider has order Furoscix for you. This is an on-body infuser that gives you a dose of Furosemide.   We have provided you with 2 sample kits today and more will be shipped to your home from the specialty pharmacy  Furoscix Direct will call you to discuss before shipping so, PLEASE answer unknown calls  For questions regarding the device call Furoscix Direct at [phone]  Ensure you write down the time you start your infusion so that if there is a problem you will know how long the infusion lasted  Use Furoscix only AS DIRECTED by our office  Dosing Directions:   Day 1= TODAY (Thur 3/6) Use 1 Furoscix kit, DO NOT take Furosemide  Day 2= Fri 3/7 Use 1 Furoscix kit, DO NOT take furosemide   Day 3= Sat 3/8 RESTART Furosemide 40 mg (1 tab) Twice daily    **Please look for the bottle of Spironolactone**  If you find it please start taking 1/2 tab Daily,  IF you do not find it please let us know   Special Instructions // Education:  Do the following things EVERYDAY: Weigh yourself in the morning before breakfast. Write it down and keep it in a log. Take your medicines as prescribed Eat low salt foods--Limit salt (sodium) to 2000 mg per day.  Stay as active as you can everyday Limit all fluids for the day to less than 2 liters   Follow-Up in: 4 days--Monday 11/08/23 at 11:30 AM   At the [facility], you and your health needs are our priority. We have a designated team specialized in the treatment of Heart Failure. This Care Team includes your primary Heart Failure Specialized Cardiologist (physician), Advanced Practice Providers (APPs- Physician Assistants and Nurse Practitioners), and Pharmacist who all work together to provide you with the care you need, when you need it.   You may see any of the following providers on your designated Care Team at your next follow up:  Dr. Arvilla Meres Dr. Marca Ancona Dr. Dorthula Nettles Dr. Theresia Bough Tonye Becket, NP Robbie Lis, [place] [facility] [place], [place] Brynda Peon, NP [place] Lee, NP Karle Plumber, PharmD   Please be sure to bring in all your medications bottles to every appointment.   Need to Contact us:  If you have any questions or concerns before your next appointment please send [place] a message through [place] or call our office at [phone].    TO LEAVE A MESSAGE FOR THE NURSE SELECT OPTION 2, PLEASE LEAVE A MESSAGE INCLUDING: YOUR NAME DATE OF BIRTH CALL BACK NUMBER REASON FOR CALL**this is important as we prioritize the call backs  YOU WILL RECEIVE A CALL BACK THE SAME DAY AS LONG AS YOU CALL BEFORE 4:00 PM

## 2023-11-05 ENCOUNTER — Telehealth (HOSPITAL_COMMUNITY): Payer: Self-pay | Admitting: Cardiology

## 2023-11-05 NOTE — Telephone Encounter (Signed)
 Patient presents to office with defective Furoscix  Sample provided  Medication Samples have been provided to the patient.  Drug name: furoscix       Strength: 80mg /70mL        Qty: 1  LOT: [id number]  Exp.Date: 12/28/2024  Dosing instructions: one infuser as directed   The patient has been instructed regarding the correct time, dose, and frequency of taking this medication, including desired effects and most common side effects.   Magda Bernheim M 1:48 PM 11/05/2023

## 2023-11-05 NOTE — Telephone Encounter (Signed)
 Excellent . Thank you!  Joshua Zeringue NP-C  2:50 PM

## 2023-11-05 NOTE — Telephone Encounter (Signed)
 Pts daughter left VM on triage line to report pt HAS been taking spiro. They did find spiro in meds bag at home  Med list updated and message to provider as Lorain Childes

## 2023-11-08 ENCOUNTER — Ambulatory Visit (HOSPITAL_COMMUNITY)
Admission: RE | Admit: 2023-11-08 | Discharge: 2023-11-08 | Disposition: A | Discharge status: Home / Self Care | Source: Ambulatory Visit | Attending: Physician Assistant

## 2023-11-08 ENCOUNTER — Encounter (HOSPITAL_COMMUNITY)

## 2023-11-08 ENCOUNTER — Encounter (HOSPITAL_COMMUNITY): Payer: 59

## 2023-11-08 VITALS — BP 164/90 | HR 105 | Wt 134.0 lb

## 2023-11-08 DIAGNOSIS — I130 Hypertensive heart and chronic kidney disease with heart failure and stage 1 through stage 4 chronic kidney disease, or unspecified chronic kidney disease: Secondary | ICD-10-CM | POA: Diagnosis not present

## 2023-11-08 DIAGNOSIS — I5042 Chronic combined systolic (congestive) and diastolic (congestive) heart failure: Secondary | ICD-10-CM | POA: Diagnosis not present

## 2023-11-08 DIAGNOSIS — Z79899 Other long term (current) drug therapy: Secondary | ICD-10-CM | POA: Insufficient documentation

## 2023-11-08 DIAGNOSIS — Z9221 Personal history of antineoplastic chemotherapy: Secondary | ICD-10-CM | POA: Diagnosis not present

## 2023-11-08 DIAGNOSIS — Z7901 Long term (current) use of anticoagulants: Secondary | ICD-10-CM | POA: Insufficient documentation

## 2023-11-08 DIAGNOSIS — N1831 Chronic kidney disease, stage 3a: Secondary | ICD-10-CM | POA: Insufficient documentation

## 2023-11-08 DIAGNOSIS — Z8546 Personal history of malignant neoplasm of prostate: Secondary | ICD-10-CM | POA: Insufficient documentation

## 2023-11-08 DIAGNOSIS — R0609 Other forms of dyspnea: Secondary | ICD-10-CM

## 2023-11-08 DIAGNOSIS — Z87891 Personal history of nicotine dependence: Secondary | ICD-10-CM | POA: Insufficient documentation

## 2023-11-08 DIAGNOSIS — I2510 Atherosclerotic heart disease of native coronary artery without angina pectoris: Secondary | ICD-10-CM | POA: Diagnosis not present

## 2023-11-08 DIAGNOSIS — I255 Ischemic cardiomyopathy: Secondary | ICD-10-CM | POA: Insufficient documentation

## 2023-11-08 DIAGNOSIS — I10 Essential (primary) hypertension: Secondary | ICD-10-CM

## 2023-11-08 DIAGNOSIS — Z85818 Personal history of malignant neoplasm of other sites of lip, oral cavity, and pharynx: Secondary | ICD-10-CM | POA: Diagnosis not present

## 2023-11-08 DIAGNOSIS — Z7984 Long term (current) use of oral hypoglycemic drugs: Secondary | ICD-10-CM | POA: Insufficient documentation

## 2023-11-08 DIAGNOSIS — J449 Chronic obstructive pulmonary disease, unspecified: Secondary | ICD-10-CM | POA: Insufficient documentation

## 2023-11-08 LAB — BASIC METABOLIC PANEL
Anion gap: 15 (ref 5–15)
BUN: 42 mg/dL — ABNORMAL HIGH (ref 8–23)
CO2: 28 mmol/L (ref 22–32)
Calcium: 9.4 mg/dL (ref 8.9–10.3)
Chloride: 91 mmol/L — ABNORMAL LOW (ref 98–111)
Creatinine, Ser: 1.66 mg/dL — ABNORMAL HIGH (ref 0.61–1.24)
GFR, Estimated: 44 mL/min — ABNORMAL LOW (ref >60)
Glucose, Bld: 117 mg/dL — ABNORMAL HIGH (ref 70–99)
Potassium: 4.1 mmol/L (ref 3.5–5.1)
Sodium: 134 mmol/L — ABNORMAL LOW (ref 135–145)

## 2023-11-08 LAB — BRAIN NATRIURETIC PEPTIDE: B Natriuretic Peptide: >4500 pg/mL — ABNORMAL HIGH (ref 0.0–100.0)

## 2023-11-08 MED ORDER — FUROSEMIDE 40 MG PO TABS: ORAL_TABLET | ORAL | 6 refills | Status: DC

## 2023-11-08 NOTE — Progress Notes (Signed)
 ReDS Vest / Clip - 11/08/23 1100       ReDS Vest / Clip   Station Marker C    Ruler Value 24.5    ReDS Value Range High volume overload    ReDS Actual Value 54

## 2023-11-08 NOTE — Progress Notes (Signed)
 HF MD: Dr Gala Romney Chief Complaint: Heart Failure   HPI: Collin Young is a 73 y.o. male w/ chronic combined systolic and diastolic CHF/ ischemic CM, severe multivessel CAD treated medically, 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     HF dates back to 12/2016. Echo then w/ severely reduced LVEF 10-15%, RV mild-mod reduced, mod AI/MR/TR. LHC showed severe calcific disease in the proximal to mid RCA and chronic total occlusion of the proximal circumflex.There was consideration of RCA PCI with recommendations for medical management.    Admitted  9/22 with HF. Echo EF down 25-30%, RV not well visualized. Mod MR/AI. Chest CT showed  large right pleural effusion.  There was questionable PE on his CT but ultimately this was thought to be just artifact with poor opacification. Given w/ IV Lasix and discharged home on Entresto, Farxiga, Toprol XL and PRN lasix.    Admitted 06/11/21 with HF and acute hypoxic respiratory failure.Complicated by PE, pleural effusion, and cardiogenic shock. Diuresed with IV lasix.  Limited Echo EF 20%, global HK, RV normal.  RHC cardiogenic shock with R>L heart failure. GDMT titrated. Not felt to be a VAD candidate due to cachexia and RV failure. Discharged home, weight 121 lbs.    Admitted 07/07/21 with HF. Echo EF 25-30%, severe LV dysfunction, grade I DD, RV mildly reduced.  Diuresed with IV lasix and transitioned to po lasix 40 mg daily. Continued on GDMT, discharge weight 122 lbs.   Admitted 10/25/23 with A/C HFrEF. Diuresed with IV lasix and transitioned to lasix 40 mg po daily. Cath films reviewed. No targets for PCI. Plan for medical management. Placed entresto, farxiga, and spironolactone. Discharged 10/29/23. ? D/C weight 117? Not sure this was accurate given previous day was 128 pounds.   He was seen in the clinic 11/02/23. He was volume overloaded in the setting of high sodium diet.  Lasix increased to 40 mg twice a day and spiro was added. He was unable to pick up spiro at the pharmacy due to previous fill history.  On 3/6 he was seen in the HF clinic for volume overload. Reds elevated. Given 2 doses of Furoscix. Reports better urine output.He did not weigh over the weekend.   Today he returns for HF follow up.Overall feeling fine. Denies orthopnea/PND.Still with some shortness of breath with exertion but feels better.  Appetite ok.  Following low salt diet. No fever or chills. Taking all medications. Wants to work later this week. His daughters help with his medications.   ROS: All systems negative except as listed in HPI, PMH and Problem List.  SH:  Social History   Socioeconomic History   Marital status: Legally Separated    Spouse name: Not on file   Number of children: 2   Years of education: Not on file   Highest education level: Not on file  Occupational History   Occupation: retired  Tobacco Use   Smoking status: Former    Current packs/day: 0.00    Average packs/day: 1 pack/day for 48.8 years (48.8 ttl pk-yrs)    Types: Cigarettes    Start date: 11/08/1960    Quit date: 08/31/2009    Years since quitting: 14.1   Smokeless tobacco: Former    Types: Chew    Quit date: 09/01/1999  Vaping Use   Vaping status: Never Used  Substance and Sexual Activity   Alcohol use: Not Currently  Alcohol/week: 0.0 standard drinks of alcohol    Comment: quit in 2001   Drug use: No    Comment: back in the day used cocaine,alcohol, marijuana   Sexual activity: Not Currently    Partners: Female    Birth control/protection: None  Other Topics Concern   Not on file  Social History Narrative   Not on file   Social Drivers of Health   Financial Resource Strain: Low Risk  (07/20/2023)   Overall Financial Resource Strain (CARDIA)    Difficulty of Paying Living Expenses: Not hard at all  Food Insecurity: No Food Insecurity (11/01/2023)   Hunger Vital Sign    Worried  About Running Out of Food in the Last Year: Never true    Ran Out of Food in the Last Year: Never true  Transportation Needs: No Transportation Needs (11/01/2023)   PRAPARE - Administrator, Civil Service (Medical): No    Lack of Transportation (Non-Medical): No  Physical Activity: Inactive (07/20/2023)   Exercise Vital Sign    Days of Exercise per Week: 0 days    Minutes of Exercise per Session: 0 min  Stress: No Stress Concern Present (07/20/2023)   [employer] of Occupational Health - Occupational Stress Questionnaire    Feeling of Stress : Not at all  Social Connections: Moderately Integrated (10/26/2023)   Social Connection and Isolation Panel [NHANES]    Frequency of Communication with Friends and Family: More than three times a week    Frequency of Social Gatherings with Friends and Family: More than three times a week    Attends Religious Services: More than 4 times per year    Active Member of [employer] or Organizations: Yes    Attends Engineer, structural: More than 4 times per year    Marital Status: Divorced  Intimate Partner Violence: Not At Risk (11/01/2023)   Humiliation, Afraid, Rape, and Kick questionnaire    Fear of Current or Ex-Partner: No    Emotionally Abused: No    Physically Abused: No    Sexually Abused: No    FH:  Family History  Problem Relation Age of Onset   Breast cancer Mother    Prostate cancer Neg Hx    Colon cancer Neg Hx    Pancreatic cancer Neg Hx     Past Medical History:  Diagnosis Date   Chronic combined systolic and diastolic CHF (congestive heart failure) (HCC)    followed by dr t. Duke Salvia   COPD (chronic obstructive pulmonary disease) (HCC)    Coronary artery disease cardiologist-- dr Chilton Si   per cardiac cath 01-05-2017  chronic total occlusion pLCx with collaterals, 99% severe calcified prox. to mid RCA, otherwise mild to moderate CAD (medically managed)   Esophageal cancer, stage IIIB [facility])  oncologist-  dr ennever/  dr moody   dx 2010  SCC Stage IIIB completed chemoradiation;  localized recurrent left piriform sinus 01/ 2015,  completed concurrent chemoradiatoin 04/ 2015   GERD (gastroesophageal reflux disease)    09-07-2018   no issues since Gtube removed 06/ 2019   History of alcohol abuse    quit 2001   History of cancer chemotherapy    2010;   2015   History of radiation therapy    10-19-2013 to  12-05-2013 pyriform sinus 69.96 Gy/48fx;   Radiation completed 2010 for esophageal cancer   History of seizure 2001   alcohol withdrawal   HOH (hard of hearing)    Hyperlipidemia  Hypertension    Iron deficiency anemia due to chronic blood loss 11/14/2021   Ischemic cardiomyopathy 12/2016   01-03-2017  echo,  ef 10-15%/   echo 05-06-2017 EF improved to 45-50%   Prostate cancer [facility]) urologist-  dr ottelin/  oncologist-- dr Kathrynn Running   dx 05-27-2018--- Stage T1c,  Gleason 4+3,  PSA 9.3--  scheduled for brachytherapy 09-23-2017   Renal cyst, left     Current Outpatient Medications  Medication Sig Dispense Refill   albuterol (VENTOLIN HFA) 108 (90 Base) MCG/ACT inhaler Inhale 2 puffs into the lungs every 6 (six) hours as needed for wheezing or shortness of breath. 18 g 5   dapagliflozin propanediol (FARXIGA) 10 MG TABS tablet Take 1 tablet (10 mg total) by mouth daily. 90 tablet 3   ELIQUIS 5 MG TABS tablet TAKE 1 TABLET(5 MG) BY MOUTH TWICE DAILY 60 tablet 5   Evolocumab (REPATHA SURECLICK) 140 MG/ML SOAJ Inject 140 mg into the skin every 14 (fourteen) days. Please keep your upcoming appointment for refills. 6 mL 3   fentaNYL (DURAGESIC) 12 MCG/HR Place 1 patch onto the skin every 3 (three) days. 10 patch 0   furosemide (LASIX) 40 MG tablet Take 1 tablet (40 mg total) by mouth 2 (two) times daily. 60 tablet 3   ipratropium (ATROVENT) 0.03 % nasal spray Place 2 sprays into both nostrils every 12 (twelve) hours. 30 mL 12   polyethylene glycol (MIRALAX / GLYCOLAX) 17 g packet Take  17 g by mouth daily as needed for mild constipation. 14 each 0   pravastatin (PRAVACHOL) 80 MG tablet TAKE 1 TABLET(80 MG) BY MOUTH EVERY EVENING 90 tablet 3   sacubitril-valsartan (ENTRESTO) 24-26 MG Take 1 tablet by mouth 2 (two) times daily. 60 tablet 0   spironolactone (ALDACTONE) 25 MG tablet Take 0.5 tablets (12.5 mg total) by mouth daily. 45 tablet 3   tadalafil (CIALIS) 20 MG tablet Take 0.5-1 tablets (10-20 mg total) by mouth every other day as needed for erectile dysfunction. 10 tablet 11   umeclidinium bromide (INCRUSE ELLIPTA) 62.5 MCG/ACT AEPB Inhale 1 puff into the lungs daily. 30 each 6   No current facility-administered medications for this encounter.   Facility-Administered Medications Ordered in Other Encounters  Medication Dose Route Frequency Provider Last Rate Last Admin   topical emolient (BIAFINE) emulsion   Topical Daily Dorothy Puffer, MD   Given at 01/19/14 0912    Vitals:   11/08/23 1117  BP: (!) 164/90  Pulse: (!) 105  SpO2: 97%  Weight: 60.8 kg (134 lb)    Wt Readings from Last 3 Encounters:  11/08/23 60.8 kg (134 lb)  11/04/23 61.9 kg (136 lb 8 oz)  11/04/23 63.2 kg (139 lb 6.4 oz)    PHYSICAL EXAM: General:  HOH, Thin, Multiple layers of clothes. No resp difficulty Neck: supple. JVP 8-9 .  Cor: PMI nondisplaced. Tachy Regular rate & rhythm. No rubs, gallops or murmurs. Lungs: Coarse throughout. No wheeze. Abdomen: soft, nontender, nondistended.  Extremities: no cyanosis, clubbing, rash, edema Neuro: alert & oriented x3  ZOX:WRUEA Tach   ASSESSMENT & PLAN: 1. hronic Biventricular HF EF remained 20-30% from 2021 to 2023 when it improved to 40-45%.Echo (9/21): EF down again, 35-40%. RV normal  -Echo 10/2023 EF down 20-25% RV moderately reduced + WMA.  Dr Gala Romney  reviewed previous cath. No targets.  ReDs reading: 54 % Abnormal. On exam he looks better today. Increase lasix 80 mg in am and 40 mg in pm.  NYHA  III. Volume looks a little better.   Increase lasix 80 mg in am an d 40 mg  - No BB with recent decompensation.  - Continue entresto 24-26 mg twice a day  -Continue farxiga 10 mg daily.  -Check BMET and BNP.   2. HTN  Elevated. Just had medications.    3. CAD Cath 2018 severe Prox -Mid  CTO proximal circumflex. Mod left main and mid LAD.  - No chest pain. Continue pravastatin and eliquis.   4. CKD Stage IIIa Creatinine baseline 1.2-1.3 Check BMET   5. GOC He would benefit from outpatient Palliative Care given COPD/HF and declining condition.   Follow up next week with pharmacy team. I called and discussed medication changes with his daughter French Ana.     Tyria Springer NP-C  11:47 AM

## 2023-11-08 NOTE — Patient Instructions (Addendum)
 Increase Lasix to 80 mg in am and 40 mg in pm. Labs today - will call you if abnormal. Keep your scheduled follow up in Heart Failure Clinic as scheduled - see below. Please call us at [phone] if any questions or concerns prior to your next visit.

## 2023-11-13 ENCOUNTER — Other Ambulatory Visit: Payer: Self-pay | Admitting: Family Medicine

## 2023-11-13 DIAGNOSIS — R062 Wheezing: Secondary | ICD-10-CM

## 2023-11-14 NOTE — Progress Notes (Incomplete)
 ***In Progress***    Advanced Heart Failure Clinic Note  Primary Care: Abbe Amsterdam, MD HF Cardiologist: Dr. Gala Romney  HPI:  Collin Young is a 73 y.o. male w/ chronic combined systolic and diastolic CHF/ ischemic CM, severe multivessel CAD treated medically, 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     HF dates back to 12/2016. Echo then w/ severely reduced LVEF 10-15%, RV mild-mod reduced, mod AI/MR/TR. LHC showed severe calcific disease in the proximal to mid RCA and chronic total occlusion of the proximal circumflex.There was consideration of RCA PCI with recommendations for medical management. Admitted  05/2021 with HF. Echo EF down 25-30%, RV not well visualized. Mod MR/AI. Chest CT showed  large right pleural effusion.  There was questionable PE on his CT but ultimately this was thought to be just artifact with poor opacification. Given w/ IV Lasix and discharged home on Entresto, Farxiga, metoprolol XL and PRN lasix. Readmitted for similar, 05/2021 (cardiogenic shock with R > L HF) and 07/2021 - diuresed with IV Lasix and discharged.    Admitted 10/25/23 with A/C HFrEF. Diuresed with IV lasix and transitioned to lasix 40 mg po daily. Cath films reviewed. No targets for PCI. Plan for medical management. Placed on Entresto, Farxiga, and spironolactone. Discharged 10/29/23. He was seen in the clinic 11/02/23. He was volume overloaded in the setting of high sodium diet. Lasix increased to 40 mg twice a day and spironolactone was added. He was unable to pick up spironolactone at the pharmacy due to previous fill history. On 11/04/23 he was seen in the HF clinic for volume overload. Reds elevated. Given 2 doses of Furoscix. Returned for follow-up on 11/08/23 - reported ShOB with exertion. Daughters help with medications. Given ReDs 54%, instructed to increase Lasix to 80 mg in the AM and 40 mg in the PM  Today he returns  to HF clinic for pharmacist medication titration. At last visit with APP, Amy Clegg, Lasix was increased to 80 mg in the AM and 40 mg in the PM.   Last BP 164/90, HR 105, Wt 134 lbs Increase Entresto? Increase Cleda Daub? No BB with RV failure UHC dual complete Why is he on prava 80 Fill history OK  Overall feeling ***. Dizziness, lightheadedness, fatigue:  Chest pain or palpitations:  How is your breathing?: *** SOB: Able to complete all ADLs. Activity level ***  Weight at home pounds. Takes furosemide/torsemide/bumex *** mg *** daily.  LEE PND/Orthopnea  Appetite *** Low-salt diet:   Physical Exam Cost/affordability of meds   HF Medications: Entresto 24-26 mg BID Spironolactone 12.5 mg daily Farxiga 10 mg daily Lasix 80 mg in the AM, 40 mg in the PM  Has the patient been experiencing any side effects to the medications prescribed?  {YES NO:22349}  Does the patient have any problems obtaining medications due to transportation or finances?   {YES NO:22349}  Understanding of regimen: {excellent/good/fair/poor:19665} Understanding of indications: {excellent/good/fair/poor:19665} Potential of compliance: {excellent/good/fair/poor:19665} Patient understands to avoid NSAIDs. Patient understands to avoid decongestants.    Pertinent Lab Values: 11/08/23: Serum creatinine 1.68 (previous 1.42), BUN 42, Potassium 4.1, Sodium 134, BNP >4500  Vital Signs: Weight: *** (last clinic weight: 134 lbs) Blood pressure: ***  Heart rate: ***   Assessment/Plan: 1. Chronic biventricular CHF (EF 20-25%, Feb 2025), due to ischemic cardiomyopathy. NYHA class *** symptoms. EF remained 20-30% from 2021 to 2023 when it improved to 40-45%.Echo (9/21):  EF down again, 35-40%. RV normal  -Echo 10/2023 EF down 20-25% RV moderately reduced + WMA.  Dr Gala Romney  reviewed previous cath. No targets.  ReDs reading: 54 % Abnormal. On exam he looks better today. Increase lasix 80 mg in am and 40 mg in pm.   NYHA III. Volume looks a little better.  Increase lasix 80 mg in am an d 40 mg  - No BB with recent decompensation.  - Continue entresto 24-26 mg twice a day  -Continue farxiga 10 mg daily.  -Check BMET and BNP.    2. HTN  Elevated. Just had medications.     3. CAD Cath 2018 severe Prox -Mid  CTO proximal circumflex. Mod left main and mid LAD.  - No chest pain. Continue pravastatin and eliquis.    4. CKD Stage IIIa Creatinine baseline 1.2-1.3 Check BMET    5. GOC He would benefit from outpatient Palliative Care given COPD/HF and declining condition.    - Basic disease state pathophysiology, medication indication, mechanism and side effects reviewed at length with patient and he verbalized understanding  Follow up APP clinic?  Nils Pyle, PharmD PGY1 Pharmacy Resident  Karle Plumber, PharmD, BCPS, BCCP, CPP Heart Failure Clinic Pharmacist [phone]

## 2023-11-16 ENCOUNTER — Ambulatory Visit (HOSPITAL_COMMUNITY)
Admission: RE | Admit: 2023-11-16 | Discharge: 2023-11-16 | Disposition: A | Discharge status: Home / Self Care | Source: Ambulatory Visit | Attending: Cardiology | Admitting: Cardiology

## 2023-11-16 VITALS — BP 122/90 | HR 102 | Wt 128.8 lb

## 2023-11-16 DIAGNOSIS — I739 Peripheral vascular disease, unspecified: Secondary | ICD-10-CM | POA: Diagnosis not present

## 2023-11-16 DIAGNOSIS — I2510 Atherosclerotic heart disease of native coronary artery without angina pectoris: Secondary | ICD-10-CM | POA: Insufficient documentation

## 2023-11-16 DIAGNOSIS — I130 Hypertensive heart and chronic kidney disease with heart failure and stage 1 through stage 4 chronic kidney disease, or unspecified chronic kidney disease: Secondary | ICD-10-CM | POA: Diagnosis not present

## 2023-11-16 DIAGNOSIS — Z7901 Long term (current) use of anticoagulants: Secondary | ICD-10-CM | POA: Insufficient documentation

## 2023-11-16 DIAGNOSIS — E785 Hyperlipidemia, unspecified: Secondary | ICD-10-CM | POA: Diagnosis not present

## 2023-11-16 DIAGNOSIS — I5023 Acute on chronic systolic (congestive) heart failure: Secondary | ICD-10-CM | POA: Diagnosis present

## 2023-11-16 DIAGNOSIS — Z7984 Long term (current) use of oral hypoglycemic drugs: Secondary | ICD-10-CM | POA: Diagnosis not present

## 2023-11-16 DIAGNOSIS — J449 Chronic obstructive pulmonary disease, unspecified: Secondary | ICD-10-CM | POA: Insufficient documentation

## 2023-11-16 DIAGNOSIS — N1831 Chronic kidney disease, stage 3a: Secondary | ICD-10-CM | POA: Diagnosis not present

## 2023-11-16 DIAGNOSIS — Z79899 Other long term (current) drug therapy: Secondary | ICD-10-CM | POA: Insufficient documentation

## 2023-11-16 DIAGNOSIS — I5042 Chronic combined systolic (congestive) and diastolic (congestive) heart failure: Secondary | ICD-10-CM | POA: Diagnosis not present

## 2023-11-16 DIAGNOSIS — Z8501 Personal history of malignant neoplasm of esophagus: Secondary | ICD-10-CM | POA: Insufficient documentation

## 2023-11-16 DIAGNOSIS — I255 Ischemic cardiomyopathy: Secondary | ICD-10-CM | POA: Diagnosis not present

## 2023-11-16 DIAGNOSIS — I5082 Biventricular heart failure: Secondary | ICD-10-CM | POA: Diagnosis not present

## 2023-11-16 LAB — BASIC METABOLIC PANEL
Anion gap: 14 (ref 5–15)
BUN: 55 mg/dL — ABNORMAL HIGH (ref 8–23)
CO2: 28 mmol/L (ref 22–32)
Calcium: 9.2 mg/dL (ref 8.9–10.3)
Chloride: 94 mmol/L — ABNORMAL LOW (ref 98–111)
Creatinine, Ser: 1.87 mg/dL — ABNORMAL HIGH (ref 0.61–1.24)
GFR, Estimated: 38 mL/min — ABNORMAL LOW (ref >60)
Glucose, Bld: 113 mg/dL — ABNORMAL HIGH (ref 70–99)
Potassium: 4.1 mmol/L (ref 3.5–5.1)
Sodium: 136 mmol/L (ref 135–145)

## 2023-11-16 LAB — BRAIN NATRIURETIC PEPTIDE: B Natriuretic Peptide: >4500 pg/mL — ABNORMAL HIGH (ref 0.0–100.0)

## 2023-11-16 NOTE — Patient Instructions (Signed)
 It was a pleasure seeing you today!  MEDICATIONS: -We are changing your medications today -Call if you have questions about your medications.  LABS: -We will call you if your labs need attention.   NEXT APPOINTMENT: Return to clinic on 11/30/23.   In general, to take care of your heart failure: -Limit your fluid intake to 2 Liters (half-gallon) per day.   -Limit your salt intake to ideally 2-3 grams (2000-3000 mg) per day. -Weigh yourself daily and record, and bring that "weight diary" to your next appointment.  (Weight gain of 2-3 pounds in 1 day typically means fluid weight.) -The medications for your heart are to help your heart and help you live longer.   -Please contact us before stopping any of your heart medications.  Call the clinic at [phone] with questions or to reschedule future appointments.

## 2023-11-16 NOTE — Progress Notes (Signed)
 Advanced Heart Failure Clinic Note   Primary Care: Abbe Amsterdam, MD HF Cardiologist: Dr. Gala Romney  HPI:  Collin Young is a 73 y.o. male w/ chronic combined systolic and diastolic CHF/ ischemic CM, severe multivessel CAD treated medically, h/o esophageal and piriform sinus cancer treated w/ chemo + radiation, prostate cancer, stage IIIa CKD, COPD, severe PAD, HTN and HLD.     HF dates back to 12/2016. Echo then w/ severely reduced LVEF 10-15%, RV mild-mod reduced, mod AI/MR/TR. LHC showed severe calcific disease in the proximal to mid RCA and chronic total occlusion of the proximal circumflex.There was consideration of RCA PCI with recommendations for medical management. Admitted  05/2021 with HF. Echo EF down 25-30%, RV not well visualized. Mod MR/AI. Chest CT showed  large right pleural effusion.  There was questionable PE on his CT but ultimately this was thought to be just artifact with poor opacification. Given w/ IV Lasix and discharged home on Entresto, Farxiga, metoprolol XL and PRN Lasix. Readmitted for similar, 05/2021 (cardiogenic shock with R > L HF) and 07/2021 - diuresed with IV Lasix and discharged.    Admitted 10/25/23 with A/C HFrEF. Diuresed with IV Lasix and transitioned to Lasix 40 mg po daily. Cath films reviewed. No targets for PCI. Plan for medical management. Placed on Entresto, Farxiga, and spironolactone. Discharged 10/29/23. He was seen in the clinic 11/02/23. He was volume overloaded in the setting of high sodium diet. Lasix increased to 40 mg twice a day and spironolactone was added. He was unable to pick up spironolactone at the pharmacy due to previous fill history. On 11/04/23 he was seen in the HF clinic for volume overload. Reds elevated. Given 2 doses of Furoscix. Returned for follow-up on 11/08/23 - reported SOB with exertion. Daughters help with medications. Given ReDs 54%, instructed to increase Lasix to 80 mg in the AM and 40 mg in the PM.  Today he returns to HF  clinic for pharmacist medication titration. He presents with his daughter, Collin Young. At last visit with APP, Amy Clegg, Lasix was increased to 80 mg in the AM and 40 mg in the PM. Patient and daughter confirm that Patient reports that he is feeling ok. Primary complaint is SOB. He will get SOB with minimal activity such as walking to the bathroom, and describes wheezing/huffing and puffing that improves with PRN albuterol. Reports that he recently ran out of albuterol- but appears Rx was resent by PCP yesterday. He reports waking up in the night multiple times gasping for air - he usually starts out in his bed, then moves to the recliner, then the couch due to recurrent PND. Activity is also limited by leg pain due to severe PAD (R leg) and moderate PAD (L leg) per ABI report in Sept 2024. Patient is currently treating this pain with biofreeze gel.  Reports his appetite is ok - he blends his food given difficulty swallowing with hx of throat cancer. Daughter reports that they have been trying to limit his water intake to 2.5 bottles per day, as he previously had a lot of water.   HF Medications: Entresto 24-26 mg BID Spironolactone 12.5 mg daily Farxiga 10 mg daily Lasix 80 mg in the AM, 40 mg in the PM  Has the patient been experiencing any side effects to the medications prescribed? No  Does the patient have any problems obtaining medications due to transportation or finances? No; [facility] Medicare/Medicaid  Understanding of regimen: fair Understanding of indications: fair  Potential of compliance: fair - daughter Collin Young helps with medications Patient understands to avoid NSAIDs. Patient understands to avoid decongestants.    Pertinent Lab Values: 11/08/23: Serum creatinine 1.68 (previous 1.42), BUN 42, Potassium 4.1, Sodium 134, BNP >4500  Vital Signs: Weight: 128.8 lbs (last clinic weight: 134 lbs) Blood pressure: 122/90 lbs  Heart rate: 102   Assessment/Plan: 1. Chronic biventricular CHF (EF  20-25% in Feb 2025), due to ischemic cardiomyopathy. NYHA class III-IV symptoms.ReDs reading improved from 54% on 11/08/23 to 39%. Weight also down ~ 5 lbs in 10 days with increased Lasix. No LEE on exam. SOB is persistent, but likely exacerbated by comorbid COPD. Will monitor BMET and BMP today to monitor fluid status and kidney function with increase in diuretic. Although escalation in MRA or ARNI could help with fluid status, concerned for kidney dysfunction with SCr trending up and tolerability with patient-reported dizziness.  - EF remained 20-30% from 2021 to 2023 when it improved to 40-45%. Echo (05/2020): EF down again, 35-40%. RV normal  - Echo 10/2023 EF down 20-25% RV moderately reduced + WMA.  Dr Gala Romney reviewed previous cath. No targets.  - ReDs reading: 54 % Abnormal on 11/08/23 improved to 39% today (moderate volume overload). Medication Plan - Continue Lasix 80 mg in the AM (2 tabs) and 40 mg in the PM (1 tab). - No beta blocker with biventricular HF and recent decompensation - Continue Entresto 24-26 mg twice daily - Continue spironolactone 12.5 mg daily - Continue Farxiga 10 mg daily - BMET and BNP today. Will outreach one of patient's daughters if medication changes are needed.    2. HTN  - Slightly elevated above goal today, but improved from previous. - Continue current regimen    3. CAD/PAD - Cath 2018 severe Prox -Mid  CTO proximal circumflex. Mod left main and mid LAD.  - ABI Sept 2024 with severe R leg PAD and moderate L leg PAD. Patient reports functional limitation.  - No chest pain. - Continue pravastatin 80 mg daily. Was switched to pravastatin due to concern for drug interaction with previous HepC treatment (completed May 2022). Could consider transitioning back to high intensity statin.  - Continue Repatha 140 mg subcutaneous every 2 weeks - Continue Eliquis 5 mg twice daily   4. CKD Stage IIIa - Creatinine baseline 1.2-1.3 - Check BMET today - Continue  Farxiga 10 mg daily   5. GOC He would benefit from outpatient Palliative Care given COPD/HF and declining condition.   Follow up APP clinic on 11/30/23  Nils Pyle, PharmD PGY1 Pharmacy Resident  Karle Plumber, PharmD, BCPS, BCCP, CPP Heart Failure Clinic Pharmacist [phone]

## 2023-11-16 NOTE — Progress Notes (Signed)
 ReDS Vest / Clip - 11/16/23 1400       ReDS Vest / Clip   Station Marker C    Ruler Value 23.5    ReDS Value Range Moderate volume overload    ReDS Actual Value 39

## 2023-11-24 NOTE — Progress Notes (Addendum)
 [name] [address] [place], [place] [address] [phone]  Date:  11/25/2023   Name:  Collin Young   DOB:  05-May-1951   MRN:  [id number]  PCP:  Pearline Cables, MD    Chief Complaint: Possible UTI (Burning with urination, come and go)   History of Present Illness:  Collin Young is a 73 y.o. very pleasant male patient who presents with the following:  Patient seen today for follow-up, they are concerned about a possible UTI Most recent visit with myself was earlier this month, March 6 Seen today with his granddaughter who is in nursing school  He was admitted from February 24 through February 28 with heart failure exacerbation  At his visit earlier this month we noted his weight was up and he was experiencing orthopnea, oxygen dropping to 70% with walking.  He was seen by advanced heart failure clinic the same day: ASSESSMENT & PLAN: 1. Acute on Chronic Biventricular HF EF remained 20-30% from 2021 to 2023 when it improved to 40-45%.Echo (9/21): EF down again, 35-40%. RV normal  -Echo 10/2023 EF down 20-25% RV moderately reduced + WMA.  Dr Gala Romney  reviewed previous cath. No targets.  ReDs reading: 49 %, abnormal. Volume overloaded. See below for diuretic plan.  - NYHA III Volume status elevated.  FUROSCIX prescribed  Patient viewed patient education video with QR code for Lenor Coffin code for FUROSCIX placed on AVS  Call FUROSCIX Direct at [phone] for questions regarding on body infuser.   Day 1 11/04/23 FUROSCIX 80 mg once daily  via on body infuser  Day 2  11/05/23 FUROSCIX 80 mg once daily  via on body  Day 3 3/8 /25  -Lasix 40 mg po twice a day.  - No BB with recent decompensation.  - Needs to pick spiro up. We called his pharmacy. Unfortunately due to previous 90 day fill he is unable to pick up. I have asked his daughter to verify his medications when she gets home.  -Continue  entresto 24-26 mg twice a day  -Continue farxiga 10 mg daily.  - Follow up next week to reassess volume status. --------------------------  He followed up with CHF clinic on March 10-feeling better His weight seems back to his normal baseline.  He notes his weight is about 120 pounds when he weighs himself at home in the morning, this seems baseline/dry weight down  Wt Readings from Last 3 Encounters:  11/25/23 126 lb 3.2 oz (57.2 kg)  11/16/23 128 lb 12.8 oz (58.4 kg)  11/08/23 134 lb (60.8 kg)   TodayGlenn is concerned that he will feel like he has to urinate frequently, then when he goes there is not much urine there.  He may have a little bit of stinging with urination.  He has not seen any blood.  He is not having any particular back or abdominal pain, no fever, chills, or vomiting.  He notes his appetite is good  Patient Active Problem List   Diagnosis Date Noted   Acute on chronic congestive heart failure (HCC) 10/25/2023   Iron deficiency anemia due to chronic blood loss 11/14/2021   Acute on chronic systolic CHF (congestive heart failure) (HCC) 07/07/2021   COPD (chronic obstructive pulmonary disease) (HCC)    Hyponatremia    SOB (shortness of breath)    DNR (do not resuscitate)    Weakness generalized    Elevated  LFTs    Pulmonary embolism (HCC) 06/11/2021   GERD without esophagitis 05/08/2021   Mixed hyperlipidemia 05/08/2021   Hypertensive urgency 05/08/2021   Elevated troponin level not due myocardial infarction 05/08/2021   Pure hypercholesterolemia 09/23/2020   Claudication in peripheral vascular disease (HCC) 01/25/2020   Late latent syphilis 01/15/2020   Coronary artery disease involving native coronary artery of native heart 07/05/2019   Chronic kidney disease, stage 3a (HCC) 07/05/2019   Combined systolic and diastolic heart failure (HCC) 09/26/2018   Malignant neoplasm of prostate (HCC) 06/17/2018   Medication management    Dysphagia    Palliative care by  specialist    Ischemic cardiomyopathy    Congestive dilated cardiomyopathy (HCC)    Acute on chronic systolic congestive heart failure (HCC) 01/02/2017   Malignant neoplasm of pyriform sinus (HCC) 09/28/2013   Piriform sinus tumor 09/24/2013   NONSPECIFIC ABN FINDING RAD & OTH EXAM GI TRACT 05/14/2009    Past Medical History:  Diagnosis Date   Chronic combined systolic and diastolic CHF (congestive heart failure) (HCC)    followed by dr t. Duke Salvia   COPD (chronic obstructive pulmonary disease) (HCC)    Coronary artery disease cardiologist-- dr Chilton Si   per cardiac cath 01-05-2017  chronic total occlusion pLCx with collaterals, 99% severe calcified prox. to mid RCA, otherwise mild to moderate CAD (medically managed)   Esophageal cancer, stage IIIB [facility]) oncologist-  dr ennever/  dr moody   dx 2010  SCC Stage IIIB completed chemoradiation;  localized recurrent left piriform sinus 01/ 2015,  completed concurrent chemoradiatoin 04/ 2015   GERD (gastroesophageal reflux disease)    09-07-2018   no issues since Gtube removed 06/ 2019   History of alcohol abuse    quit 2001   History of cancer chemotherapy    2010;   2015   History of radiation therapy    10-19-2013 to  12-05-2013 pyriform sinus 69.96 Gy/11fx;   Radiation completed 2010 for esophageal cancer   History of seizure 2001   alcohol withdrawal   HOH (hard of hearing)    Hyperlipidemia    Hypertension    Iron deficiency anemia due to chronic blood loss 11/14/2021   Ischemic cardiomyopathy 12/2016   01-03-2017  echo,  ef 10-15%/   echo 05-06-2017 EF improved to 45-50%   Prostate cancer [facility]) urologist-  dr ottelin/  oncologist-- dr Kathrynn Running   dx 05-27-2018--- Stage T1c,  Gleason 4+3,  PSA 9.3--  scheduled for brachytherapy 09-23-2017   Renal cyst, left     Past Surgical History:  Procedure Laterality Date   BIOPSY  12/18/2021   Procedure: BIOPSY;  Surgeon: Rachael Fee, MD;  Location: [facility];  Service:  Gastroenterology;;   CARDIOVASCULAR STRESS TEST  10/09/09   normal nuclearr stress test, EF 57% Adolph Pollack)   CENTRAL LINE INSERTION  06/16/2021   Procedure: CENTRAL LINE INSERTION;  Surgeon: Dolores Patty, MD;  Location: [facility] INVASIVE CV LAB;  Service: Cardiovascular;;   ESOPHAGOGASTRODUODENOSCOPY (EGD) WITH PROPOFOL N/A 12/18/2021   Procedure: ESOPHAGOGASTRODUODENOSCOPY (EGD) WITH PROPOFOL;  Surgeon: Rachael Fee, MD;  Location: [facility];  Service: Gastroenterology;  Laterality: N/A;   IR GASTROSTOMY TUBE MOD SED  01/13/2017   IR GASTROSTOMY TUBE REMOVAL  02/03/2018   IR PATIENT EVAL TECH 0-60 MINS  03/25/2017   IR REMOVAL TUN ACCESS W/ PORT W/O FL MOD SED  01/15/2017   IR REPLACE G-TUBE SIMPLE WO FLUORO  01/13/2018   LARYNGOSCOPY N/A 09/15/2013  Procedure: LARYNGOSCOPY;  Surgeon: Christia Reading, MD;  [facility] OR;  Service: ENT;  Laterality: N/A;  direct laryngoscopy with biopsy and esophagoscopy   RADIOACTIVE SEED IMPLANT N/A 09/23/2018   Procedure: RADIOACTIVE SEED IMPLANT/BRACHYTHERAPY IMPLANT;  Surgeon: Ihor Gully, MD;  Location: [facility] [place];  Service: Urology;  Laterality: N/A;   RIGHT HEART CATH N/A 06/16/2021   Procedure: RIGHT HEART CATH;  Surgeon: Dolores Patty, MD;  Location: [facility] INVASIVE CV LAB;  Service: Cardiovascular;  Laterality: N/A;   RIGHT/LEFT HEART CATH AND CORONARY ANGIOGRAPHY N/A 01/05/2017   Procedure: Right/Left Heart Cath and Coronary Angiography;  Surgeon: Kathleene Hazel, MD;  Location: [facility] INVASIVE CV LAB;  Service: Cardiovascular;  Laterality: N/A;   TRANSTHORACIC ECHOCARDIOGRAM  05/06/2017   ef 45-50%,  grade 1 diastolic dysfunction/  AV severe calcified non coronary cusp with moderate regurg. , no stenosis (valve area per echo 01-05-2017 1.08cm^2)/  mild MR, TR, and PR/  mild LAE    Social History   Tobacco Use   Smoking status: Former    Current packs/day: 0.00    Average packs/day: 1 pack/day for 48.8 years (48.8 ttl  pk-yrs)    Types: Cigarettes    Start date: 11/08/1960    Quit date: 08/31/2009    Years since quitting: 14.2   Smokeless tobacco: Former    Types: Chew    Quit date: 09/01/1999  Vaping Use   Vaping status: Never Used  Substance Use Topics   Alcohol use: Not Currently    Alcohol/week: 0.0 standard drinks of alcohol    Comment: quit in 2001   Drug use: No    Comment: back in the day used cocaine,alcohol, marijuana    Family History  Problem Relation Age of Onset   Breast cancer Mother    Prostate cancer Neg Hx    Colon cancer Neg Hx    Pancreatic cancer Neg Hx     No Known Allergies  Medication list has been reviewed and updated.  Current Outpatient Medications on File Prior to Visit  Medication Sig Dispense Refill   albuterol (VENTOLIN HFA) 108 (90 Base) MCG/ACT inhaler Inhale 2 puffs into the lungs every 6 (six) hours as needed for wheezing or shortness of breath. 18 g 5   dapagliflozin propanediol (FARXIGA) 10 MG TABS tablet Take 1 tablet (10 mg total) by mouth daily. 90 tablet 3   ELIQUIS 5 MG TABS tablet TAKE 1 TABLET(5 MG) BY MOUTH TWICE DAILY 60 tablet 5   Evolocumab (REPATHA SURECLICK) 140 MG/ML SOAJ Inject 140 mg into the skin every 14 (fourteen) days. Please keep your upcoming appointment for refills. 6 mL 3   fentaNYL (DURAGESIC) 12 MCG/HR Place 1 patch onto the skin every 3 (three) days. 10 patch 0   furosemide (LASIX) 40 MG tablet Take 80 mg in am and 40 mg in pm 90 tablet 6   ipratropium (ATROVENT) 0.03 % nasal spray Place 2 sprays into both nostrils every 12 (twelve) hours. 30 mL 12   polyethylene glycol (MIRALAX / GLYCOLAX) 17 g packet Take 17 g by mouth daily as needed for mild constipation. 14 each 0   pravastatin (PRAVACHOL) 80 MG tablet TAKE 1 TABLET(80 MG) BY MOUTH EVERY EVENING 90 tablet 3   sacubitril-valsartan (ENTRESTO) 24-26 MG Take 1 tablet by mouth 2 (two) times daily. 60 tablet 0   spironolactone (ALDACTONE) 25 MG tablet Take 0.5 tablets (12.5 mg  total) by mouth daily. 45 tablet 3   tadalafil (CIALIS) 20  MG tablet Take 0.5-1 tablets (10-20 mg total) by mouth every other day as needed for erectile dysfunction. 10 tablet 11   umeclidinium bromide (INCRUSE ELLIPTA) 62.5 MCG/ACT AEPB Inhale 1 puff into the lungs daily. 30 each 6   Current Facility-Administered Medications on File Prior to Visit  Medication Dose Route Frequency Provider Last Rate Last Admin   topical emolient (BIAFINE) emulsion   Topical Daily Dorothy Puffer, MD   Given at 01/19/14 6283    Review of Systems:  As per HPI- otherwise negative.   Physical Examination: Vitals:   11/25/23 1325  BP: 118/78  Pulse: (!) 106  Resp: 18  Temp: 97.8 F (36.6 C)  SpO2: 98%   Vitals:   11/25/23 1325  Weight: 126 lb 3.2 oz (57.2 kg)  Height: 5\' 6"  (1.676 m)   Body mass index is 20.37 kg/m. Ideal Body Weight: Weight in (lb) to have BMI = 25: 154.6  GEN: no acute distress. Petite build, looks well in his normal self HEENT: Atraumatic, Normocephalic.  Hearing aids Ears and Nose: No external deformity. CV: RRR, No M/G/R. No JVD. No thrill. No extra heart sounds. PULM: CTA B, no wheezes, crackles, rhonchi. No retractions. No resp. distress. No accessory muscle use. ABD: S, NT, ND, +BS. No rebound. No HSM. No CVA tenderness, belly benign  EXTR: No c/c/e PSYCH: Normally interactive. Conversant.    Pulse Readings from Last 3 Encounters:  11/25/23 (!) 106  11/16/23 (!) 102  11/08/23 (!) 105    He is on farxiga which is likely the cause of glycosuria  Lab Results  Component Value Date   HGBA1C 5.8 (H) 09/26/2018    Results for orders placed or performed in visit on 11/25/23  POCT urinalysis dipstick   Collection Time: 11/25/23  1:31 PM  Result Value Ref Range   Color, UA yellow yellow   Clarity, UA cloudy (A) clear   Glucose, UA >=1,000 (A) negative mg/dL   Bilirubin, UA negative negative   Ketones, POC UA negative negative mg/dL   Spec Grav, UA <=1.517 (A)  1.010 - 1.025   Blood, UA negative negative   pH, UA 7.5 5.0 - 8.0   Protein Ur, POC negative negative mg/dL   Urobilinogen, UA 1.0 0.2 or 1.0 E.U./dL   Nitrite, UA Negative Negative   Leukocytes, UA Trace (A) Negative   *Note: Due to a large number of results and/or encounters for the requested time period, some results have not been displayed. A complete set of results can be found in Results Review.    Assessment and Plan: Dysuria - Plan: Urine Culture, POCT urinalysis dipstick, cephALEXin (KEFLEX) 500 MG capsule  Glycosuria - Plan: Hemoglobin A1c, Basic metabolic panel with GFR Pt seen today with concern of possible UTI Start on keflex while we await urine culture Check A1c and BMP to eval for possible diabetes due to glycosuria, if normal likely due to SGLT2 drug Fluid status looks good today   Signed Abbe Amsterdam, MD  Addendum 3/29, received labs as below Results for orders placed or performed in visit on 11/25/23  POCT urinalysis dipstick   Collection Time: 11/25/23  1:31 PM  Result Value Ref Range   Color, UA yellow yellow   Clarity, UA cloudy (A) clear   Glucose, UA >=1,000 (A) negative mg/dL   Bilirubin, UA negative negative   Ketones, POC UA negative negative mg/dL   Spec Grav, UA <=6.160 (A) 1.010 - 1.025   Blood, UA negative negative  pH, UA 7.5 5.0 - 8.0   Protein Ur, POC negative negative mg/dL   Urobilinogen, UA 1.0 0.2 or 1.0 E.U./dL   Nitrite, UA Negative Negative   Leukocytes, UA Trace (A) Negative  Urine Culture   Collection Time: 11/25/23  1:46 PM   Specimen: Urine  Result Value Ref Range   MICRO NUMBER: [id number]    SPECIMEN QUALITY: Adequate    Sample Source URINE    STATUS: FINAL    Result: No Growth   Hemoglobin A1c   Collection Time: 11/25/23  1:46 PM  Result Value Ref Range   Hgb A1c MFr Bld 6.1 4.6 - 6.5 %  Basic metabolic panel with GFR   Collection Time: 11/25/23  1:46 PM  Result Value Ref Range   Sodium 132 (L) 135 - 145 mEq/L    Potassium 4.2 3.5 - 5.1 mEq/L   Chloride 87 (L) 96 - 112 mEq/L   CO2 31 19 - 32 mEq/L   Glucose, Bld 97 70 - 99 mg/dL   BUN 28 (H) 6 - 23 mg/dL   Creatinine, Ser 5.40 0.40 - 1.50 mg/dL   GFR 98.11 (L) >91.47 mL/min   Calcium 9.1 8.4 - 10.5 mg/dL   *Note: Due to a large number of results and/or encounters for the requested time period, some results have not been displayed. A complete set of results can be found in Results Review.

## 2023-11-25 ENCOUNTER — Ambulatory Visit (INDEPENDENT_AMBULATORY_CARE_PROVIDER_SITE_OTHER): Admitting: Family Medicine

## 2023-11-25 VITALS — BP 118/78 | HR 106 | Temp 97.8°F | Resp 18 | Ht 66.0 in | Wt 126.2 lb

## 2023-11-25 DIAGNOSIS — R300 Dysuria: Secondary | ICD-10-CM | POA: Diagnosis not present

## 2023-11-25 DIAGNOSIS — E871 Hypo-osmolality and hyponatremia: Secondary | ICD-10-CM | POA: Diagnosis not present

## 2023-11-25 DIAGNOSIS — R81 Glycosuria: Secondary | ICD-10-CM

## 2023-11-25 LAB — POCT URINALYSIS DIP (MANUAL ENTRY)
Bilirubin, UA: NEGATIVE
Blood, UA: NEGATIVE
Glucose, UA: >=1000 mg/dL — AB
Ketones, POC UA: NEGATIVE mg/dL
Nitrite, UA: NEGATIVE
Protein Ur, POC: NEGATIVE mg/dL
Spec Grav, UA: <=1.005 — AB (ref 1.010–1.025)
Urobilinogen, UA: 1 U/dL
pH, UA: 7.5 (ref 5.0–8.0)

## 2023-11-25 MED ORDER — CEPHALEXIN 500 MG PO CAPS
500.0000 mg | ORAL_CAPSULE | Freq: Two times a day (BID) | ORAL | 0 refills | Status: DC
Start: 2023-11-25 — End: 2024-01-11

## 2023-11-25 NOTE — Patient Instructions (Signed)
 It was good to see you again today!   You may have an infection in your bladder.  I will know for sure when your urine culture comes back from the lab in 2 or 3 days.  In the meantime we will go ahead and start you on treatment with keflex antibiotic.    I will also check your blood for any evidence of diabetes today.  You do have sugar in your urine- this is likely just due to the [place] you are taking for your heart.  However I want to make sure you have not developed diabetes as well

## 2023-11-26 LAB — BASIC METABOLIC PANEL WITH GFR
BUN: 28 mg/dL — ABNORMAL HIGH (ref 6–23)
CO2: 31 meq/L (ref 19–32)
Calcium: 9.1 mg/dL (ref 8.4–10.5)
Chloride: 87 meq/L — ABNORMAL LOW (ref 96–112)
Creatinine, Ser: 1.36 mg/dL (ref 0.40–1.50)
GFR: 51.81 mL/min — ABNORMAL LOW (ref >60.00)
Glucose, Bld: 97 mg/dL (ref 70–99)
Potassium: 4.2 meq/L (ref 3.5–5.1)
Sodium: 132 meq/L — ABNORMAL LOW (ref 135–145)

## 2023-11-26 LAB — URINE CULTURE
MICRO NUMBER:: 16255872
Result:: NO GROWTH
SPECIMEN QUALITY:: ADEQUATE

## 2023-11-27 ENCOUNTER — Encounter: Payer: Self-pay | Admitting: Family Medicine

## 2023-11-27 LAB — HEMOGLOBIN A1C: Hgb A1c MFr Bld: 6.1 % (ref 4.6–6.5)

## 2023-11-27 NOTE — Addendum Note (Signed)
 Addended by: Abbe Amsterdam C on: 11/27/2023 12:08 PM   Modules accepted: Orders

## 2023-11-29 ENCOUNTER — Telehealth (HOSPITAL_COMMUNITY): Payer: Self-pay

## 2023-11-29 NOTE — Telephone Encounter (Signed)
 Called to confirm/remind patient of their appointment at the [facility] on 11/30/23***.   Appointment:   [x] Confirmed  [] Left mess   [] No answer/No voice mail  [] Phone not in service  Patient reminded to bring all medications and/or complete list.  Confirmed patient has transportation. Gave directions, instructed to utilize valet parking.

## 2023-11-30 ENCOUNTER — Ambulatory Visit (HOSPITAL_COMMUNITY)
Admission: RE | Admit: 2023-11-30 | Discharge: 2023-11-30 | Disposition: A | Discharge status: Home / Self Care | Source: Ambulatory Visit | Attending: Family Medicine | Admitting: Family Medicine

## 2023-11-30 ENCOUNTER — Encounter (HOSPITAL_COMMUNITY): Payer: Self-pay

## 2023-11-30 VITALS — BP 118/70 | HR 109 | Wt 126.0 lb

## 2023-11-30 DIAGNOSIS — I5042 Chronic combined systolic (congestive) and diastolic (congestive) heart failure: Secondary | ICD-10-CM | POA: Diagnosis not present

## 2023-11-30 DIAGNOSIS — Z87891 Personal history of nicotine dependence: Secondary | ICD-10-CM | POA: Diagnosis not present

## 2023-11-30 DIAGNOSIS — I10 Essential (primary) hypertension: Secondary | ICD-10-CM

## 2023-11-30 DIAGNOSIS — I5082 Biventricular heart failure: Secondary | ICD-10-CM | POA: Insufficient documentation

## 2023-11-30 DIAGNOSIS — E785 Hyperlipidemia, unspecified: Secondary | ICD-10-CM | POA: Insufficient documentation

## 2023-11-30 DIAGNOSIS — I255 Ischemic cardiomyopathy: Secondary | ICD-10-CM | POA: Diagnosis not present

## 2023-11-30 DIAGNOSIS — Z7984 Long term (current) use of oral hypoglycemic drugs: Secondary | ICD-10-CM | POA: Insufficient documentation

## 2023-11-30 DIAGNOSIS — I2510 Atherosclerotic heart disease of native coronary artery without angina pectoris: Secondary | ICD-10-CM | POA: Diagnosis not present

## 2023-11-30 DIAGNOSIS — J449 Chronic obstructive pulmonary disease, unspecified: Secondary | ICD-10-CM | POA: Diagnosis not present

## 2023-11-30 DIAGNOSIS — Z79899 Other long term (current) drug therapy: Secondary | ICD-10-CM | POA: Diagnosis not present

## 2023-11-30 DIAGNOSIS — Z7901 Long term (current) use of anticoagulants: Secondary | ICD-10-CM | POA: Insufficient documentation

## 2023-11-30 DIAGNOSIS — I130 Hypertensive heart and chronic kidney disease with heart failure and stage 1 through stage 4 chronic kidney disease, or unspecified chronic kidney disease: Secondary | ICD-10-CM | POA: Diagnosis not present

## 2023-11-30 DIAGNOSIS — N1831 Chronic kidney disease, stage 3a: Secondary | ICD-10-CM | POA: Diagnosis not present

## 2023-11-30 LAB — BRAIN NATRIURETIC PEPTIDE: B Natriuretic Peptide: 2986.1 pg/mL — ABNORMAL HIGH (ref 0.0–100.0)

## 2023-11-30 LAB — BASIC METABOLIC PANEL WITH GFR
Anion gap: 12 (ref 5–15)
BUN: 24 mg/dL — ABNORMAL HIGH (ref 8–23)
CO2: 29 mmol/L (ref 22–32)
Calcium: 9 mg/dL (ref 8.9–10.3)
Chloride: 90 mmol/L — ABNORMAL LOW (ref 98–111)
Creatinine, Ser: 1.27 mg/dL — ABNORMAL HIGH (ref 0.61–1.24)
GFR, Estimated: 60 mL/min — ABNORMAL LOW
Glucose, Bld: 89 mg/dL (ref 70–99)
Potassium: 4 mmol/L (ref 3.5–5.1)
Sodium: 131 mmol/L — ABNORMAL LOW (ref 135–145)

## 2023-11-30 MED ORDER — BISOPROLOL FUMARATE 5 MG PO TABS: 5.0000 mg | ORAL_TABLET | Freq: Every day | ORAL | 5 refills | Status: DC

## 2023-11-30 NOTE — Progress Notes (Signed)
 HF MD: Dr Gala Romney  HPI: Collin Young is a 73 y.o. male w/ chronic combined systolic and diastolic CHF/ ischemic CM, severe multivessel CAD treated medically, 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     HF dates back to 12/2016. Echo then w/ severely reduced LVEF 10-15%, RV mild-mod reduced, mod AI/MR/TR. LHC showed severe calcific disease in the proximal to mid RCA and chronic total occlusion of the proximal circumflex.There was consideration of RCA PCI with recommendations for medical management.    Admitted  9/22 with HF. Echo EF down 25-30%, RV not well visualized. Mod MR/AI. Chest CT showed  large right pleural effusion.  There was questionable PE on his CT but ultimately this was thought to be just artifact with poor opacification. Given w/ IV Lasix and discharged home on Entresto, Farxiga, Toprol XL and PRN lasix.    Admitted 06/11/21 with HF and acute hypoxic respiratory failure.Complicated by PE, pleural effusion, and cardiogenic shock. Diuresed with IV lasix.  Limited Echo EF 20%, global HK, RV normal.  RHC cardiogenic shock with R>L heart failure. GDMT titrated. Not felt to be a VAD candidate due to cachexia and RV failure. Discharged home, weight 121 lbs.    Admitted 07/07/21 with HF. Echo EF 25-30%, severe LV dysfunction, grade I DD, RV mildly reduced.  Diuresed with IV lasix and transitioned to po lasix 40 mg daily. Continued on GDMT, discharge weight 122 lbs.   Echo 2/23: EF 40-45% Echo 5/24 EF 35-40%, inferior AK mild MR  Admitted 2/25 with a/c HF. Diuresed with IV lasix and transitioned to lasix 40 mg po daily. Cath films reviewed. No targets for PCI. Plan for medical management. GDMT titrated and discharged home, weight 117 lbs (Not sure this was accurate given previous day weight was 128 lbs).  Used Furoscix x 2 doses 11/04/23.  Today he returns for HF follow up with his grand-daughter.  Overall feeling "great!" Not requiring inhaler and breathing is OK. Can walk up steps without dyspnea. Working out at home. Denies palpitations, abnormal bleeding, CP, dizziness, edema, or PND/Orthopnea. Appetite ok. No fever or chills. Weight at home 119 pounds. Taking all medications. His daughters help with his medications.   Cardiac Studies Echo 5/24 EF 35-40%, inferior AK mild MR Echo 2/23: EF 40-45% Echo 11/22: EF 25-30%, global HK, RV mildly reduced. Echo 10/22: EF 20%, global HK, RV mildly reduced Echo 9/21: EF down again, 35-40%. RV normal  Echo 9/18: EF improved up to 45-50%, Mod AI. RV normal.  Echo 2018: EF 15%.    - cMRI (12/22) unable to quanify EF, LGE with prior infarct basal to mid inferior/inferolateral walls. LGE is greater than 50% transmural suggesting territory is not viable  ROS: All systems negative except as listed in HPI, PMH and Problem List.  SH:  Social History   Socioeconomic History   Marital status: Legally Separated    Spouse name: Not on file   Number of children: 2   Years of education: Not on file   Highest education level: Not on file  Occupational History   Occupation: retired  Tobacco Use   Smoking status: Former    Current packs/day: 0.00    Average packs/day: 1 pack/day for 48.8 years (48.8 ttl pk-yrs)    Types: Cigarettes    Start date: 11/08/1960    Quit date: 08/31/2009    Years since quitting: 14.2   Smokeless tobacco: Former  Types: Dorna Bloom    Quit date: 09/01/1999  Vaping Use   Vaping status: Never Used  Substance and Sexual Activity   Alcohol use: Not Currently    Alcohol/week: 0.0 standard drinks of alcohol    Comment: quit in 2001   Drug use: No    Comment: back in the day used cocaine,alcohol, marijuana   Sexual activity: Not Currently    Partners: Female    Birth control/protection: None  Other Topics Concern   Not on file  Social History Narrative   Not on file   Social Drivers of Health   Financial Resource Strain:  Low Risk  (07/20/2023)   Overall Financial Resource Strain (CARDIA)    Difficulty of Paying Living Expenses: Not hard at all  Food Insecurity: No Food Insecurity (11/01/2023)   Hunger Vital Sign    Worried About Running Out of Food in the Last Year: Never true    Ran Out of Food in the Last Year: Never true  Transportation Needs: No Transportation Needs (11/01/2023)   PRAPARE - Administrator, Civil Service (Medical): No    Lack of Transportation (Non-Medical): No  Physical Activity: Inactive (07/20/2023)   Exercise Vital Sign    Days of Exercise per Week: 0 days    Minutes of Exercise per Session: 0 min  Stress: No Stress Concern Present (07/20/2023)   [employer] of Occupational Health - Occupational Stress Questionnaire    Feeling of Stress : Not at all  Social Connections: Moderately Integrated (10/26/2023)   Social Connection and Isolation Panel [NHANES]    Frequency of Communication with Friends and Family: More than three times a week    Frequency of Social Gatherings with Friends and Family: More than three times a week    Attends Religious Services: More than 4 times per year    Active Member of [employer] or Organizations: Yes    Attends Engineer, structural: More than 4 times per year    Marital Status: Divorced  Intimate Partner Violence: Not At Risk (11/01/2023)   Humiliation, Afraid, Rape, and Kick questionnaire    Fear of Current or Ex-Partner: No    Emotionally Abused: No    Physically Abused: No    Sexually Abused: No   FH:  Family History  Problem Relation Age of Onset   Breast cancer Mother    Prostate cancer Neg Hx    Colon cancer Neg Hx    Pancreatic cancer Neg Hx    Past Medical History:  Diagnosis Date   Chronic combined systolic and diastolic CHF (congestive heart failure) (HCC)    followed by dr t. Duke Salvia   COPD (chronic obstructive pulmonary disease) (HCC)    Coronary artery disease cardiologist-- dr Chilton Si   per  cardiac cath 01-05-2017  chronic total occlusion pLCx with collaterals, 99% severe calcified prox. to mid RCA, otherwise mild to moderate CAD (medically managed)   Esophageal cancer, stage IIIB [facility]) oncologist-  dr ennever/  dr moody   dx 2010  SCC Stage IIIB completed chemoradiation;  localized recurrent left piriform sinus 01/ 2015,  completed concurrent chemoradiatoin 04/ 2015   GERD (gastroesophageal reflux disease)    09-07-2018   no issues since Gtube removed 06/ 2019   History of alcohol abuse    quit 2001   History of cancer chemotherapy    2010;   2015   History of radiation therapy    10-19-2013 to  12-05-2013 pyriform sinus 69.96 Gy/36fx;  Radiation completed 2010 for esophageal cancer   History of seizure 2001   alcohol withdrawal   HOH (hard of hearing)    Hyperlipidemia    Hypertension    Iron deficiency anemia due to chronic blood loss 11/14/2021   Ischemic cardiomyopathy 12/2016   01-03-2017  echo,  ef 10-15%/   echo 05-06-2017 EF improved to 45-50%   Prostate cancer [facility]) urologist-  dr ottelin/  oncologist-- dr Kathrynn Running   dx 05-27-2018--- Stage T1c,  Gleason 4+3,  PSA 9.3--  scheduled for brachytherapy 09-23-2017   Renal cyst, left    Current Outpatient Medications  Medication Sig Dispense Refill   albuterol (VENTOLIN HFA) 108 (90 Base) MCG/ACT inhaler Inhale 2 puffs into the lungs every 6 (six) hours as needed for wheezing or shortness of breath. 18 g 5   cephALEXin (KEFLEX) 500 MG capsule Take 1 capsule (500 mg total) by mouth 2 (two) times daily. 14 capsule 0   dapagliflozin propanediol (FARXIGA) 10 MG TABS tablet Take 1 tablet (10 mg total) by mouth daily. 90 tablet 3   ELIQUIS 5 MG TABS tablet TAKE 1 TABLET(5 MG) BY MOUTH TWICE DAILY 60 tablet 5   Evolocumab (REPATHA SURECLICK) 140 MG/ML SOAJ Inject 140 mg into the skin every 14 (fourteen) days. Please keep your upcoming appointment for refills. 6 mL 3   furosemide (LASIX) 40 MG tablet Take 80 mg in am and 40 mg  in pm 90 tablet 6   ipratropium (ATROVENT) 0.03 % nasal spray Place 2 sprays into both nostrils every 12 (twelve) hours. (Patient taking differently: Place 2 sprays into both nostrils as needed.) 30 mL 12   polyethylene glycol (MIRALAX / GLYCOLAX) 17 g packet Take 17 g by mouth daily as needed for mild constipation. 14 each 0   pravastatin (PRAVACHOL) 80 MG tablet TAKE 1 TABLET(80 MG) BY MOUTH EVERY EVENING 90 tablet 3   sacubitril-valsartan (ENTRESTO) 24-26 MG Take 1 tablet by mouth 2 (two) times daily. 60 tablet 0   spironolactone (ALDACTONE) 25 MG tablet Take 0.5 tablets (12.5 mg total) by mouth daily. 45 tablet 3   umeclidinium bromide (INCRUSE ELLIPTA) 62.5 MCG/ACT AEPB Inhale 1 puff into the lungs daily. 30 each 6   fentaNYL (DURAGESIC) 12 MCG/HR Place 1 patch onto the skin every 3 (three) days. 10 patch 0   No current facility-administered medications for this encounter.   Facility-Administered Medications Ordered in Other Encounters  Medication Dose Route Frequency Provider Last Rate Last Admin   topical emolient (BIAFINE) emulsion   Topical Daily Dorothy Puffer, MD   Given at 01/19/14 0912   BP 118/70   Pulse (!) 109   Wt 57.2 kg (126 lb)   SpO2 98%   BMI 20.34 kg/m   Wt Readings from Last 3 Encounters:  11/30/23 57.2 kg (126 lb)  11/25/23 57.2 kg (126 lb 3.2 oz)  11/16/23 58.4 kg (128 lb 12.8 oz)   PHYSICAL EXAM: General:  NAD. No resp difficulty HEENT: Normal Neck: Supple. No JVD. Cor: Regular rate & rhythm. No rubs, gallops or murmurs. Lungs: Clear Abdomen: Soft, nontender, nondistended.  Extremities: No cyanosis, clubbing, rash, edema Neuro: Alert & oriented x 3, moves all 4 extremities w/o difficulty. Affect pleasant.  ASSESSMENT & PLAN: 1. Chronic Biventricular HF - EF remained 20-30% from 2021 to 2023 when it improved to 40-45%. - Echo (9/21): EF down again, 35-40%. RV normal  - Echo 2/25 EF down 20-25% RV moderately reduced + WMA. Dr Gala Romney  reviewed previous  cath. No targets.  - Improved NYHA II-IIb. Volume looks good - Start bisoprolol 5 mg daily. - Continue Lasix 80/40 - Continue spiro 12.5 mg daily - Continue Entresto 24-26 mg bid - Continue Farxiga 10 mg daily.  - Labs today - Repeat echo in 3 months.  2. HTN  - BP controlled - Adding beta blocker as above  3. CAD - Cath 2018 severe Prox -Mid  CTO proximal circumflex. Mod left main and mid LAD.  - No chest pain. - Continue pravastatin. - No ASA with Eliquis.    4. CKD Stage IIIa - Baseline SCr 1.2-1.3 - Continue Farxiga - BMET today.  5. GOC - He would benefit from outpatient Palliative Care given COPD/HF and declining condition.   Follow up in 6 weeks with APP and 3 months with Dr. Gala Romney + echo. I discussed med changes today with daughter French Ana, via phone.  Anderson Malta Zakariyah Freimark FNP-BC  2:31 PM

## 2023-11-30 NOTE — Patient Instructions (Signed)
 Medication Changes:  START: BISOPROLOL 5mg  ONCE DAILY   Lab Work:  Labs done today, your results will be available in MyChart, we will contact you for abnormal readings.  Follow-Up in: 6 WEEKS AS SCHEDULED WITH APP   THEN AGAIN IN 3 MONTHS WITH DR. Gala Romney WITH AN ECHO ON THE SAME DAY PLEASE CALL OUR OFFICE AROUND JUNE TO GET SCHEDULED FOR YOUR APPOINTMENT. PHONE NUMBER IS [phone] OPTION 2   At the [facility], you and your health needs are our priority. We have a designated team specialized in the treatment of Heart Failure. This Care Team includes your primary Heart Failure Specialized Cardiologist (physician), Advanced Practice Providers (APPs- Physician Assistants and Nurse Practitioners), and Pharmacist who all work together to provide you with the care you need, when you need it.   You may see any of the following providers on your designated Care Team at your next follow up:  Dr. Arvilla Meres Dr. Marca Ancona Dr. Dorthula Nettles Dr. Theresia Bough Tonye Becket, NP Robbie Lis, [place] [facility] [place], [place] Brynda Peon, NP [place] Lee, NP Karle Plumber, PharmD   Please be sure to bring in all your medications bottles to every appointment.   Need to Contact us:  If you have any questions or concerns before your next appointment please send [place] a message through [place] or call our office at [phone].    TO LEAVE A MESSAGE FOR THE NURSE SELECT OPTION 2, PLEASE LEAVE A MESSAGE INCLUDING: YOUR NAME DATE OF BIRTH CALL BACK NUMBER REASON FOR CALL**this is important as we prioritize the call backs  YOU WILL RECEIVE A CALL BACK THE SAME DAY AS LONG AS YOU CALL BEFORE 4:00 PM

## 2023-12-01 ENCOUNTER — Telehealth (HOSPITAL_COMMUNITY): Payer: Self-pay

## 2023-12-01 DIAGNOSIS — I5042 Chronic combined systolic (congestive) and diastolic (congestive) heart failure: Secondary | ICD-10-CM

## 2023-12-01 MED ORDER — FUROSEMIDE 40 MG PO TABS
80.0000 mg | ORAL_TABLET | Freq: Two times a day (BID) | ORAL | 3 refills | Status: DC
Start: 2023-12-01 — End: 2024-03-13

## 2023-12-01 MED ORDER — POTASSIUM CHLORIDE CRYS ER 20 MEQ PO TBCR: 20.0000 meq | EXTENDED_RELEASE_TABLET | Freq: Every day | ORAL | 3 refills | Status: AC

## 2023-12-01 NOTE — Telephone Encounter (Signed)
 Called and spoke with patients daughter she is aware of all instructions and verbalized understanding. Medication list updated. New prescriptions sent to patient preferred pharmacy. Repeat labs ordered and scheduled she is aware of appointment time and date. Advised her to call with any issues or concerns and she verbalized understanding.

## 2023-12-01 NOTE — Telephone Encounter (Signed)
-----   Message from Ben Wheeler sent at 12/01/2023 11:59 AM EDT ----- BNP improved but still up.  Increase Lasix to 80 mg bid, add 20 KCL daily. Repeat BMET in 2 weeks  Do NOT start bisoprolol as discussed at last visit. Would like to get more fluid off then can consider adding this back.

## 2023-12-09 ENCOUNTER — Other Ambulatory Visit: Payer: Self-pay

## 2023-12-09 ENCOUNTER — Inpatient Hospital Stay (HOSPITAL_BASED_OUTPATIENT_CLINIC_OR_DEPARTMENT_OTHER): Payer: 59 | Admitting: Hematology & Oncology

## 2023-12-09 ENCOUNTER — Encounter: Payer: Self-pay | Admitting: Hematology & Oncology

## 2023-12-09 ENCOUNTER — Inpatient Hospital Stay: Payer: 59 | Attending: Medical Oncology

## 2023-12-09 VITALS — BP 122/71 | HR 100 | Temp 97.9°F | Resp 18 | Ht 66.0 in | Wt 124.0 lb

## 2023-12-09 DIAGNOSIS — I5023 Acute on chronic systolic (congestive) heart failure: Secondary | ICD-10-CM

## 2023-12-09 DIAGNOSIS — I509 Heart failure, unspecified: Secondary | ICD-10-CM | POA: Diagnosis not present

## 2023-12-09 DIAGNOSIS — I429 Cardiomyopathy, unspecified: Secondary | ICD-10-CM | POA: Insufficient documentation

## 2023-12-09 DIAGNOSIS — Z7901 Long term (current) use of anticoagulants: Secondary | ICD-10-CM | POA: Diagnosis not present

## 2023-12-09 DIAGNOSIS — D500 Iron deficiency anemia secondary to blood loss (chronic): Secondary | ICD-10-CM

## 2023-12-09 DIAGNOSIS — D490 Neoplasm of unspecified behavior of digestive system: Secondary | ICD-10-CM | POA: Diagnosis not present

## 2023-12-09 DIAGNOSIS — C12 Malignant neoplasm of pyriform sinus: Secondary | ICD-10-CM | POA: Diagnosis not present

## 2023-12-09 DIAGNOSIS — Z79899 Other long term (current) drug therapy: Secondary | ICD-10-CM | POA: Insufficient documentation

## 2023-12-09 DIAGNOSIS — Z85828 Personal history of other malignant neoplasm of skin: Secondary | ICD-10-CM | POA: Insufficient documentation

## 2023-12-09 DIAGNOSIS — I255 Ischemic cardiomyopathy: Secondary | ICD-10-CM

## 2023-12-09 LAB — CMP (CANCER CENTER ONLY)
ALT: 19 U/L (ref 0–44)
AST: 26 U/L (ref 15–41)
Albumin: 4.1 g/dL (ref 3.5–5.0)
Alkaline Phosphatase: 51 U/L (ref 38–126)
Anion gap: 13 (ref 5–15)
BUN: 29 mg/dL — ABNORMAL HIGH (ref 8–23)
CO2: 29 mmol/L (ref 22–32)
Calcium: 9.7 mg/dL (ref 8.9–10.3)
Chloride: 91 mmol/L — ABNORMAL LOW (ref 98–111)
Creatinine: 1.46 mg/dL — ABNORMAL HIGH (ref 0.61–1.24)
GFR, Estimated: 50 mL/min — ABNORMAL LOW (ref >60)
Glucose, Bld: 151 mg/dL — ABNORMAL HIGH (ref 70–99)
Potassium: 4 mmol/L (ref 3.5–5.1)
Sodium: 133 mmol/L — ABNORMAL LOW (ref 135–145)
Total Bilirubin: 0.3 mg/dL (ref 0.0–1.2)
Total Protein: 7.7 g/dL (ref 6.5–8.1)

## 2023-12-09 LAB — CBC WITH DIFFERENTIAL (CANCER CENTER ONLY)
Abs Immature Granulocytes: 0 10*3/uL (ref 0.00–0.07)
Basophils Absolute: 0 10*3/uL (ref 0.0–0.1)
Basophils Relative: 1 %
Eosinophils Absolute: 0 10*3/uL (ref 0.0–0.5)
Eosinophils Relative: 1 %
HCT: 44.5 % (ref 39.0–52.0)
Hemoglobin: 13.3 g/dL (ref 13.0–17.0)
Immature Granulocytes: 0 %
Lymphocytes Relative: 26 %
Lymphs Abs: 1 10*3/uL (ref 0.7–4.0)
MCH: 24.8 pg — ABNORMAL LOW (ref 26.0–34.0)
MCHC: 29.9 g/dL — ABNORMAL LOW (ref 30.0–36.0)
MCV: 82.9 fL (ref 80.0–100.0)
Monocytes Absolute: 0.7 10*3/uL (ref 0.1–1.0)
Monocytes Relative: 16 %
Neutro Abs: 2.3 10*3/uL (ref 1.7–7.7)
Neutrophils Relative %: 56 %
Platelet Count: 229 10*3/uL (ref 150–400)
RBC: 5.37 MIL/uL (ref 4.22–5.81)
RDW: 21.6 % — ABNORMAL HIGH (ref 11.5–15.5)
WBC Count: 4 10*3/uL (ref 4.0–10.5)
nRBC: 0 % (ref 0.0–0.2)

## 2023-12-09 LAB — RETIC PANEL
Immature Retic Fract: 4.7 % (ref 2.3–15.9)
RBC.: 5.38 MIL/uL (ref 4.22–5.81)
Retic Count, Absolute: 23.1 10*3/uL (ref 19.0–186.0)
Retic Ct Pct: 0.4 % (ref 0.4–3.1)
Reticulocyte Hemoglobin: 29.7 pg (ref >27.9)

## 2023-12-09 LAB — FOLATE: Folate: 18.4 ng/mL (ref >5.9)

## 2023-12-09 LAB — FERRITIN: Ferritin: 183 ng/mL (ref 24–336)

## 2023-12-09 LAB — VITAMIN B12: Vitamin B-12: 644 pg/mL (ref 180–914)

## 2023-12-09 NOTE — Progress Notes (Signed)
 Hematology and Oncology Follow Up Visit  AYDN FERRARA [id number] 06-03-1951 73 y.o. 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  Vossler is back for followup.  We last saw him back in February.  Since then, he has been doing pretty good.  He really has no complaints.  He has not been in the hospital.  His main problem right now is the congestive heart failure.  He says he is medicine to try to get fluid off his lungs.  He has had no problems on the Eliquis.  His last CT angiogram did not show any residual pulmonary embolus.  We will keep him on Eliquis lifelong because of his cardiomyopathy.  There is been no problems with respect to his malignancies.  I do not see any evidence that he has recurrence of any malignancy.  His iron levels have been on the lower side.  When we last saw him, his ferritin was 31 with iron saturation was 11%.  He did get some IV iron.  When we last saw him in February, his PSA was 3.1.  Again we will get have to watch this closely.  It seems to have been trending upward a little bit.  His appetite seems to be doing okay.  He has had no problems with swallowing.  Overall, he has had no change in bowel or bladder habits.  Currently, I would say his performance status is probably ECOG 2.   Wt Readings from Last 3 Encounters:  12/09/23 124 lb (56.2 kg)  11/30/23 126 lb (57.2 kg)  11/25/23 126 lb 3.2 oz (57.2 kg)     Medications:  Current Outpatient Medications:    albuterol (VENTOLIN HFA) 108 (90 Base) MCG/ACT inhaler, Inhale 2 puffs into the  lungs every 6 (six) hours as needed for wheezing or shortness of breath., Disp: 18 g, Rfl: 5   cephALEXin (KEFLEX) 500 MG capsule, Take 1 capsule (500 mg total) by mouth 2 (two) times daily., Disp: 14 capsule, Rfl: 0   dapagliflozin propanediol (FARXIGA) 10 MG TABS tablet, Take 1 tablet (10 mg total) by mouth daily., Disp: 90 tablet, Rfl: 3   ELIQUIS 5 MG TABS tablet, TAKE 1 TABLET(5 MG) BY MOUTH TWICE DAILY, Disp: 60 tablet, Rfl: 5   Evolocumab (REPATHA SURECLICK) 140 MG/ML SOAJ, Inject 140 mg into the skin every 14 (fourteen) days. Please keep your upcoming appointment for refills., Disp: 6 mL, Rfl: 3   fentaNYL (DURAGESIC) 12 MCG/HR, Place 1 patch onto the skin every 3 (three) days., Disp: 10 patch, Rfl: 0   furosemide (LASIX) 40 MG tablet, Take 2 tablets (80 mg total) by mouth 2 (two) times daily., Disp: 120 tablet, Rfl: 3   ipratropium (ATROVENT) 0.03 % nasal spray, Place 2 sprays into both nostrils every 12 (twelve) hours. (Patient taking differently: Place 2 sprays into both nostrils as needed.), Disp: 30 mL, Rfl: 12   polyethylene glycol (MIRALAX / GLYCOLAX) 17 g packet, Take 17 g by mouth daily as needed for mild constipation., Disp: 14 each, Rfl: 0   potassium chloride SA (KLOR-CON M) 20  MEQ tablet, Take 1 tablet (20 mEq total) by mouth daily., Disp: 30 tablet, Rfl: 3   pravastatin (PRAVACHOL) 80 MG tablet, TAKE 1 TABLET(80 MG) BY MOUTH EVERY EVENING, Disp: 90 tablet, Rfl: 3   sacubitril-valsartan (ENTRESTO) 24-26 MG, Take 1 tablet by mouth 2 (two) times daily., Disp: 60 tablet, Rfl: 0   spironolactone (ALDACTONE) 25 MG tablet, Take 0.5 tablets (12.5 mg total) by mouth daily., Disp: 45 tablet, Rfl: 3   umeclidinium bromide (INCRUSE ELLIPTA) 62.5 MCG/ACT AEPB, Inhale 1 puff into the lungs daily., Disp: 30 each, Rfl: 6 No current facility-administered medications for this visit.  Facility-Administered Medications Ordered in Other Visits:    topical emolient (BIAFINE) emulsion, , Topical,  Daily, Dorothy Puffer, MD, Given at 01/19/14 0912  Allergies: No Known Allergies  Past Medical History, Surgical history, Social history, and Family History were reviewed and updated.  Review of Systems: Review of Systems  Constitutional: Negative.   HENT: Negative.    Eyes: Negative.   Respiratory: Negative.    Cardiovascular: Negative.   Gastrointestinal: Negative.   Genitourinary: Negative.   Musculoskeletal: Negative.   Skin: Negative.   Neurological: Negative.   Endo/Heme/Allergies: Negative.   Psychiatric/Behavioral: Negative.     Physical Exam:  height is 5\' 6"  (1.676 m) and weight is 124 lb (56.2 kg). His oral temperature is 97.9 F (36.6 C). His blood pressure is 122/71 and his pulse is 100. His respiration is 18 and oxygen saturation is 98%.   Physical Exam Vitals reviewed.  HENT:     Head: Normocephalic and atraumatic.  Eyes:     Pupils: Pupils are equal, round, and reactive to light.  Cardiovascular:     Rate and Rhythm: Normal rate and regular rhythm.     Heart sounds: Normal heart sounds.  Pulmonary:     Effort: Pulmonary effort is normal. No respiratory distress.     Breath sounds: No stridor. Rhonchi (throughout) present. No wheezing or rales.  Abdominal:     General: Bowel sounds are normal.     Palpations: Abdomen is soft.  Musculoskeletal:        General: No tenderness or deformity. Normal range of motion.     Cervical back: Normal range of motion.     Right lower leg: No edema.     Left lower leg: No edema.  Lymphadenopathy:     Cervical: No cervical adenopathy.  Skin:    General: Skin is warm and dry.     Findings: No erythema or rash.  Neurological:     Mental Status: He is alert and oriented to person, place, and time.  Psychiatric:        Behavior: Behavior normal.        Thought Content: Thought content normal.        Judgment: Judgment normal.     Lab Results  Component Value Date   WBC 4.0 12/09/2023   HGB 13.3 12/09/2023   HCT  44.5 12/09/2023   MCV 82.9 12/09/2023   PLT 229 12/09/2023     Chemistry      Component Value Date/Time   NA 131 (L) 11/30/2023 1459   NA 135 05/29/2020 1556   NA 139 07/30/2017 0926   NA 137 03/10/2017 1000   K 4.0 11/30/2023 1459   K 3.9 07/30/2017 0926   K 4.3 03/10/2017 1000   CL 90 (L) 11/30/2023 1459   CL 96 (L) 07/30/2017 0926   CO2 29 11/30/2023 1459   CO2 33 07/30/2017  0926   CO2 29 03/10/2017 1000   BUN 24 (H) 11/30/2023 1459   BUN 23 05/29/2020 1556   BUN 17 07/30/2017 0926   BUN 14.7 03/10/2017 1000   CREATININE 1.27 (H) 11/30/2023 1459   CREATININE 1.12 10/05/2023 1056   CREATININE 1.4 (H) 07/30/2017 0926   CREATININE 1.1 03/10/2017 1000      Component Value Date/Time   CALCIUM 9.0 11/30/2023 1459   CALCIUM 9.6 07/30/2017 0926   CALCIUM 10.0 03/10/2017 1000   ALKPHOS 58 10/27/2023 0328   ALKPHOS 49 07/30/2017 0926   ALKPHOS 51 03/10/2017 1000   AST 131 (H) 10/27/2023 0328   AST 12 (L) 10/05/2023 1056   AST 23 03/10/2017 1000   ALT 282 (H) 10/27/2023 0328   ALT 9 10/05/2023 1056   ALT 31 07/30/2017 0926   ALT 18 03/10/2017 1000   BILITOT 0.5 10/27/2023 0328   BILITOT 0.2 10/05/2023 1056   BILITOT 0.41 03/10/2017 1000      Impression and Plan: Mr. Sibert is 73 year old gentleman. He had a localized recurrence of carcinoma of the left pyriform sinus. He underwent concurrent chemotherapy and radiation therapy. He finished his treatment back in April of 2015.   Clinically, he continues to do well.   I will be troubled by the fact that his PSA had gone up.  We will see what the level is today.  We will check his iron levels.  His hemoglobin is a whole lot better so would have to think that his iron should be decent.  Again he needs to be on anticoagulation for my point of view given the cardiomyopathy.  We are to follow him little bit more closely.  I will like to see him back probably in another couple months.    Josph Macho 4/10/20253:06  PM

## 2023-12-10 LAB — IRON AND IRON BINDING CAPACITY (CC-WL,HP ONLY)
Iron: 66 ug/dL (ref 45–182)
Saturation Ratios: 18 % (ref 17.9–39.5)
TIBC: 365 ug/dL (ref 250–450)
UIBC: 299 ug/dL (ref 117–376)

## 2023-12-15 ENCOUNTER — Ambulatory Visit (HOSPITAL_COMMUNITY)
Admission: RE | Admit: 2023-12-15 | Discharge: 2023-12-15 | Disposition: A | Discharge status: Home / Self Care | Source: Ambulatory Visit | Attending: Cardiology | Admitting: Cardiology

## 2023-12-15 DIAGNOSIS — I5042 Chronic combined systolic (congestive) and diastolic (congestive) heart failure: Secondary | ICD-10-CM | POA: Diagnosis present

## 2023-12-15 LAB — BASIC METABOLIC PANEL WITH GFR
Anion gap: 12 (ref 5–15)
BUN: 23 mg/dL (ref 8–23)
CO2: 29 mmol/L (ref 22–32)
Calcium: 9.1 mg/dL (ref 8.9–10.3)
Chloride: 91 mmol/L — ABNORMAL LOW (ref 98–111)
Creatinine, Ser: 1.19 mg/dL (ref 0.61–1.24)
GFR, Estimated: >60 mL/min (ref >60)
Glucose, Bld: 103 mg/dL — ABNORMAL HIGH (ref 70–99)
Potassium: 3.8 mmol/L (ref 3.5–5.1)
Sodium: 132 mmol/L — ABNORMAL LOW (ref 135–145)

## 2023-12-23 ENCOUNTER — Ambulatory Visit: Admitting: Dietician

## 2024-01-10 ENCOUNTER — Telehealth (HOSPITAL_COMMUNITY): Payer: Self-pay

## 2024-01-10 NOTE — Progress Notes (Signed)
 HF MD: Dr Julane Ny  HPI: Collin Young is a 73 y.o. male w/ chronic combined systolic and diastolic CHF/ ischemic CM, severe multivessel CAD treated medically, 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     HF dates back to 12/2016. Echo then w/ severely reduced LVEF 10-15%, RV mild-mod reduced, mod AI/MR/TR. LHC showed severe calcific disease in the proximal to mid RCA and chronic total occlusion of the proximal circumflex.There was consideration of RCA PCI with recommendations for medical management.    Admitted  9/22 with HF. Echo EF down 25-30%, RV not well visualized. Mod MR/AI. Chest CT showed  large right pleural effusion.  There was questionable PE on his CT but ultimately this was thought to be just artifact with poor opacification. Given w/ IV Lasix  and discharged home on Entresto , Farxiga , Toprol  XL and PRN lasix .    Admitted 06/11/21 with HF and acute hypoxic respiratory failure.Complicated by PE, pleural effusion, and cardiogenic shock. Diuresed with IV lasix .  Limited Echo EF 20%, global HK, RV normal.  RHC cardiogenic shock with R>L heart failure. GDMT titrated. Not felt to be a VAD candidate due to cachexia and RV failure. Discharged home, weight 121 lbs.    Admitted 07/07/21 with HF. Echo EF 25-30%, severe LV dysfunction, grade I DD, RV mildly reduced.  Diuresed with IV lasix  and transitioned to po lasix  40 mg daily. Continued on GDMT, discharge weight 122 lbs.   Echo 2/23: EF 40-45% Echo 5/24 EF 35-40%, inferior AK mild MR  Admitted 2/25 with a/c HF. Diuresed with IV lasix  and transitioned to lasix  40 mg po daily. Cath films reviewed. No targets for PCI. Plan for medical management. GDMT titrated and discharged home, weight 117 lbs (Not sure this was accurate given previous day weight was 128 lbs).  Used Furoscix  x 2 doses 11/04/23.  Today he returns for HF follow up with his grand-daughter.  Overall feeling "great!" Not requiring inhaler and breathing is OK. Can walk up steps without dyspnea. Working out at home. Denies palpitations, abnormal bleeding, CP, dizziness, edema, or PND/Orthopnea. Appetite ok. No fever or chills. Weight at home 119 pounds. Taking all medications. His daughters help with his medications.   Cardiac Studies Echo 5/24 EF 35-40%, inferior AK mild MR Echo 2/23: EF 40-45% Echo 11/22: EF 25-30%, global HK, RV mildly reduced. Echo 10/22: EF 20%, global HK, RV mildly reduced Echo 9/21: EF down again, 35-40%. RV normal  Echo 9/18: EF improved up to 45-50%, Mod AI. RV normal.  Echo 2018: EF 15%.    - cMRI (12/22) unable to quanify EF, LGE with prior infarct basal to mid inferior/inferolateral walls. LGE is greater than 50% transmural suggesting territory is not viable  ROS: All systems negative except as listed in HPI, PMH and Problem List.  SH:  Social History   Socioeconomic History   Marital status: Legally Separated    Spouse name: Not on file   Number of children: 2   Years of education: Not on file   Highest education level: Not on file  Occupational History   Occupation: retired  Tobacco Use   Smoking status: Former    Current packs/day: 0.00    Average packs/day: 1 pack/day for 48.8 years (48.8 ttl pk-yrs)    Types: Cigarettes    Start date: 11/08/1960    Quit date: 08/31/2009    Years since quitting: 14.3   Smokeless tobacco: Former  Types: Cassie Click    Quit date: 09/01/1999  Vaping Use   Vaping status: Never Used  Substance and Sexual Activity   Alcohol  use: Not Currently    Alcohol /week: 0.0 standard drinks of alcohol     Comment: quit in 2001   Drug use: No    Comment: back in the day used cocaine,alcohol , marijuana   Sexual activity: Not Currently    Partners: Female    Birth control/protection: None  Other Topics Concern   Not on file  Social History Narrative   Not on file   Social Drivers of Health   Financial Resource Strain:  Low Risk  (07/20/2023)   Overall Financial Resource Strain (CARDIA)    Difficulty of Paying Living Expenses: Not hard at all  Food Insecurity: No Food Insecurity (11/01/2023)   Hunger Vital Sign    Worried About Running Out of Food in the Last Year: Never true    Ran Out of Food in the Last Year: Never true  Transportation Needs: No Transportation Needs (11/01/2023)   PRAPARE - Administrator, Civil Service (Medical): No    Lack of Transportation (Non-Medical): No  Physical Activity: Inactive (07/20/2023)   Exercise Vital Sign    Days of Exercise per Week: 0 days    Minutes of Exercise per Session: 0 min  Stress: No Stress Concern Present (07/20/2023)   [employer] of Occupational Health - Occupational Stress Questionnaire    Feeling of Stress : Not at all  Social Connections: Moderately Integrated (10/26/2023)   Social Connection and Isolation Panel [NHANES]    Frequency of Communication with Friends and Family: More than three times a week    Frequency of Social Gatherings with Friends and Family: More than three times a week    Attends Religious Services: More than 4 times per year    Active Member of [employer] or Organizations: Yes    Attends Engineer, structural: More than 4 times per year    Marital Status: Divorced  Intimate Partner Violence: Not At Risk (11/01/2023)   Humiliation, Afraid, Rape, and Kick questionnaire    Fear of Current or Ex-Partner: No    Emotionally Abused: No    Physically Abused: No    Sexually Abused: No   FH:  Family History  Problem Relation Age of Onset   Breast cancer Mother    Prostate cancer Neg Hx    Colon cancer Neg Hx    Pancreatic cancer Neg Hx    Past Medical History:  Diagnosis Date   Chronic combined systolic and diastolic CHF (congestive heart failure) (HCC)    followed by dr t. Theodis Fiscal   COPD (chronic obstructive pulmonary disease) (HCC)    Coronary artery disease cardiologist-- dr Maudine Sos   per  cardiac cath 01-05-2017  chronic total occlusion pLCx with collaterals, 99% severe calcified prox. to mid RCA, otherwise mild to moderate CAD (medically managed)   Esophageal cancer, stage IIIB [facility]) oncologist-  dr ennever/  dr moody   dx 2010  SCC Stage IIIB completed chemoradiation;  localized recurrent left piriform sinus 01/ 2015,  completed concurrent chemoradiatoin 04/ 2015   GERD (gastroesophageal reflux disease)    09-07-2018   no issues since Gtube removed 06/ 2019   History of alcohol  abuse    quit 2001   History of cancer chemotherapy    2010;   2015   History of radiation therapy    10-19-2013 to  12-05-2013 pyriform sinus 69.96 Gy/27fx;  Radiation completed 2010 for esophageal cancer   History of seizure 2001   alcohol  withdrawal   HOH (hard of hearing)    Hyperlipidemia    Hypertension    Iron  deficiency anemia due to chronic blood loss 11/14/2021   Ischemic cardiomyopathy 12/2016   01-03-2017  echo,  ef 10-15%/   echo 05-06-2017 EF improved to 45-50%   Prostate cancer [facility]) urologist-  dr ottelin/  oncologist-- dr Lorri Rota   dx 05-27-2018--- Stage T1c,  Gleason 4+3,  PSA 9.3--  scheduled for brachytherapy 09-23-2017   Renal cyst, left    Current Outpatient Medications  Medication Sig Dispense Refill   albuterol  (VENTOLIN  HFA) 108 (90 Base) MCG/ACT inhaler Inhale 2 puffs into the lungs every 6 (six) hours as needed for wheezing or shortness of breath. 18 g 5   cephALEXin  (KEFLEX ) 500 MG capsule Take 1 capsule (500 mg total) by mouth 2 (two) times daily. 14 capsule 0   dapagliflozin  propanediol (FARXIGA ) 10 MG TABS tablet Take 1 tablet (10 mg total) by mouth daily. 90 tablet 3   ELIQUIS  5 MG TABS tablet TAKE 1 TABLET(5 MG) BY MOUTH TWICE DAILY 60 tablet 5   Evolocumab  (REPATHA  SURECLICK) 140 MG/ML SOAJ Inject 140 mg into the skin every 14 (fourteen) days. Please keep your upcoming appointment for refills. 6 mL 3   fentaNYL  (DURAGESIC ) 12 MCG/HR Place 1 patch onto the skin  every 3 (three) days. 10 patch 0   furosemide  (LASIX ) 40 MG tablet Take 2 tablets (80 mg total) by mouth 2 (two) times daily. 120 tablet 3   ipratropium (ATROVENT ) 0.03 % nasal spray Place 2 sprays into both nostrils every 12 (twelve) hours. (Patient taking differently: Place 2 sprays into both nostrils as needed.) 30 mL 12   polyethylene glycol (MIRALAX  / GLYCOLAX ) 17 g packet Take 17 g by mouth daily as needed for mild constipation. 14 each 0   potassium chloride  SA (KLOR-CON  M) 20 MEQ tablet Take 1 tablet (20 mEq total) by mouth daily. 30 tablet 3   pravastatin  (PRAVACHOL ) 80 MG tablet TAKE 1 TABLET(80 MG) BY MOUTH EVERY EVENING 90 tablet 3   sacubitril -valsartan  (ENTRESTO ) 24-26 MG Take 1 tablet by mouth 2 (two) times daily. 60 tablet 0   spironolactone  (ALDACTONE ) 25 MG tablet Take 0.5 tablets (12.5 mg total) by mouth daily. 45 tablet 3   umeclidinium bromide  (INCRUSE ELLIPTA ) 62.5 MCG/ACT AEPB Inhale 1 puff into the lungs daily. 30 each 6   No current facility-administered medications for this visit.   Facility-Administered Medications Ordered in Other Visits  Medication Dose Route Frequency Provider Last Rate Last Admin   topical emolient (BIAFINE) emulsion   Topical Daily Johna Myers, MD   Given at 01/19/14 0912   There were no vitals taken for this visit.  Wt Readings from Last 3 Encounters:  12/09/23 56.2 kg (124 lb)  11/30/23 57.2 kg (126 lb)  11/25/23 57.2 kg (126 lb 3.2 oz)   PHYSICAL EXAM: General:  NAD. No resp difficulty HEENT: Normal Neck: Supple. No JVD. Cor: Regular rate & rhythm. No rubs, gallops or murmurs. Lungs: Clear Abdomen: Soft, nontender, nondistended.  Extremities: No cyanosis, clubbing, rash, edema Neuro: Alert & oriented x 3, moves all 4 extremities w/o difficulty. Affect pleasant.  ASSESSMENT & PLAN: 1. Chronic Biventricular HF - EF remained 20-30% from 2021 to 2023 when it improved to 40-45%. - Echo (9/21): EF down again, 35-40%. RV normal  - Echo  2/25 EF down 20-25% RV moderately reduced +  WMA. Dr Julane Ny  reviewed previous cath. No targets.  - Improved NYHA II-IIb. Volume looks good - Start bisoprolol  5 mg daily. - Continue Lasix  80/40 - Continue spiro 12.5 mg daily - Continue Entresto  24-26 mg bid - Continue Farxiga  10 mg daily.  - Labs today - Repeat echo in 3 months.  2. HTN  - BP controlled - Adding beta blocker as above  3. CAD - Cath 2018 severe Prox -Mid  CTO proximal circumflex. Mod left main and mid LAD.  - No chest pain. - Continue pravastatin . - No ASA with Eliquis .    4. CKD Stage IIIa - Baseline SCr 1.2-1.3 - Continue Farxiga  - BMET today.  5. GOC - He would benefit from outpatient Palliative Care given COPD/HF and declining condition.   Follow up in 6 weeks with APP and 3 months with Dr. Julane Ny + echo. I discussed med changes today with daughter Sherrlyn Dolores, via phone.  Arlice Bene [facility] FNP-BC  3:44 PM

## 2024-01-10 NOTE — Telephone Encounter (Signed)
 Called to confirm/remind patient of their appointment at the [facility] on 01/11/2024 1430.   Appointment:   [x] Confirmed   Patient reminded to bring all medications and/or complete list.  Confirmed patient has transportation. Gave directions, instructed to utilize valet parking.

## 2024-01-11 ENCOUNTER — Ambulatory Visit (HOSPITAL_COMMUNITY)
Admission: RE | Admit: 2024-01-11 | Discharge: 2024-01-11 | Disposition: A | Discharge status: Home / Self Care | Source: Ambulatory Visit | Attending: Family Medicine | Admitting: Family Medicine

## 2024-01-11 ENCOUNTER — Encounter (HOSPITAL_COMMUNITY): Payer: Self-pay

## 2024-01-11 VITALS — BP 110/72 | HR 95 | Wt 126.6 lb

## 2024-01-11 DIAGNOSIS — I10 Essential (primary) hypertension: Secondary | ICD-10-CM | POA: Diagnosis not present

## 2024-01-11 DIAGNOSIS — N1831 Chronic kidney disease, stage 3a: Secondary | ICD-10-CM | POA: Diagnosis not present

## 2024-01-11 DIAGNOSIS — I5042 Chronic combined systolic (congestive) and diastolic (congestive) heart failure: Secondary | ICD-10-CM | POA: Insufficient documentation

## 2024-01-11 DIAGNOSIS — J90 Pleural effusion, not elsewhere classified: Secondary | ICD-10-CM | POA: Diagnosis not present

## 2024-01-11 DIAGNOSIS — I2510 Atherosclerotic heart disease of native coronary artery without angina pectoris: Secondary | ICD-10-CM | POA: Insufficient documentation

## 2024-01-11 DIAGNOSIS — I5022 Chronic systolic (congestive) heart failure: Secondary | ICD-10-CM | POA: Diagnosis not present

## 2024-01-11 DIAGNOSIS — I130 Hypertensive heart and chronic kidney disease with heart failure and stage 1 through stage 4 chronic kidney disease, or unspecified chronic kidney disease: Secondary | ICD-10-CM | POA: Insufficient documentation

## 2024-01-11 DIAGNOSIS — Z9221 Personal history of antineoplastic chemotherapy: Secondary | ICD-10-CM | POA: Diagnosis not present

## 2024-01-11 DIAGNOSIS — Z87891 Personal history of nicotine dependence: Secondary | ICD-10-CM | POA: Insufficient documentation

## 2024-01-11 DIAGNOSIS — Z7901 Long term (current) use of anticoagulants: Secondary | ICD-10-CM | POA: Insufficient documentation

## 2024-01-11 DIAGNOSIS — E785 Hyperlipidemia, unspecified: Secondary | ICD-10-CM | POA: Diagnosis not present

## 2024-01-11 DIAGNOSIS — I255 Ischemic cardiomyopathy: Secondary | ICD-10-CM | POA: Diagnosis not present

## 2024-01-11 DIAGNOSIS — Z85818 Personal history of malignant neoplasm of other sites of lip, oral cavity, and pharynx: Secondary | ICD-10-CM | POA: Insufficient documentation

## 2024-01-11 DIAGNOSIS — I5082 Biventricular heart failure: Secondary | ICD-10-CM | POA: Diagnosis present

## 2024-01-11 DIAGNOSIS — Z7984 Long term (current) use of oral hypoglycemic drugs: Secondary | ICD-10-CM | POA: Insufficient documentation

## 2024-01-11 DIAGNOSIS — Z79899 Other long term (current) drug therapy: Secondary | ICD-10-CM | POA: Diagnosis not present

## 2024-01-11 DIAGNOSIS — J449 Chronic obstructive pulmonary disease, unspecified: Secondary | ICD-10-CM | POA: Insufficient documentation

## 2024-01-11 DIAGNOSIS — R42 Dizziness and giddiness: Secondary | ICD-10-CM | POA: Diagnosis not present

## 2024-01-11 LAB — BASIC METABOLIC PANEL WITH GFR
Anion gap: 11 (ref 5–15)
BUN: 36 mg/dL — ABNORMAL HIGH (ref 8–23)
CO2: 33 mmol/L — ABNORMAL HIGH (ref 22–32)
Calcium: 9.6 mg/dL (ref 8.9–10.3)
Chloride: 89 mmol/L — ABNORMAL LOW (ref 98–111)
Creatinine, Ser: 1.34 mg/dL — ABNORMAL HIGH (ref 0.61–1.24)
GFR, Estimated: 56 mL/min — ABNORMAL LOW (ref >60)
Glucose, Bld: 92 mg/dL (ref 70–99)
Potassium: 4.4 mmol/L (ref 3.5–5.1)
Sodium: 133 mmol/L — ABNORMAL LOW (ref 135–145)

## 2024-01-11 LAB — BRAIN NATRIURETIC PEPTIDE: B Natriuretic Peptide: 910.5 pg/mL — ABNORMAL HIGH (ref 0.0–100.0)

## 2024-01-11 MED ORDER — ENTRESTO 24-26 MG PO TABS: 1.0000 | ORAL_TABLET | Freq: Two times a day (BID) | ORAL | 6 refills | Status: DC

## 2024-01-11 NOTE — Patient Instructions (Signed)
 Good to see you today!  No medication changes were made  Labs done today, your results will be available in MyChart, we will contact you for abnormal readings. Your physician has requested that you have an echocardiogram. Echocardiography is a painless test that uses sound waves to create images of your heart. It provides your doctor with information about the size and shape of your heart and how well your heart's chambers and valves are working. This procedure takes approximately one hour. There are no restrictions for this procedure. Please do NOT wear cologne, perfume, aftershave, or lotions (deodorant is allowed). Please arrive 15 minutes prior to your appointment time.  Please note: We ask at that you not bring children with you during ultrasound (echo/ vascular) testing. Due to room size and safety concerns, children are not allowed in the ultrasound rooms during exams. Our front office staff cannot provide observation of children in our lobby area while testing is being conducted. An adult accompanying a patient to their appointment will only be allowed in the ultrasound room at the discretion of the ultrasound technician under special circumstances. We apologize for any inconvenience.  Your physician recommends that you schedule a follow-up appointment:as scheduled  If you have any questions or concerns before your next appointment please send us  a message through [place] or call our office at [phone].    TO LEAVE A MESSAGE FOR THE NURSE SELECT OPTION 2, PLEASE LEAVE A MESSAGE INCLUDING: YOUR NAME DATE OF BIRTH CALL BACK NUMBER REASON FOR CALL**this is important as we prioritize the call backs  YOU WILL RECEIVE A CALL BACK THE SAME DAY AS LONG AS YOU CALL BEFORE 4:00 PM At the [facility], you and your health needs are our priority. As part of our continuing mission to provide you with exceptional heart care, we have created designated Provider Care Teams. These  Care Teams include your primary Cardiologist (physician) and Advanced Practice Providers (APPs- Physician Assistants and Nurse Practitioners) who all work together to provide you with the care you need, when you need it.   You may see any of the following providers on your designated Care Team at your next follow up: Dr Jules Oar Dr Peder Bourdon Dr. Alwin Baars Dr. Arta Lark Amy Marijane Shoulders, NP Ruddy Corral, [place] [facility], [place] Dennise Fitz, NP [place] Lee, NP Shawnee Dellen, NP Luster Salters, PharmD Bevely Brush, PharmD   Please be sure to bring in all your medications bottles to every appointment.    Thank you for choosing [facility]

## 2024-01-12 ENCOUNTER — Ambulatory Visit (HOSPITAL_COMMUNITY): Payer: Self-pay | Admitting: Family Medicine

## 2024-02-03 ENCOUNTER — Inpatient Hospital Stay (HOSPITAL_BASED_OUTPATIENT_CLINIC_OR_DEPARTMENT_OTHER): Admitting: Hematology & Oncology

## 2024-02-03 ENCOUNTER — Inpatient Hospital Stay: Attending: Hematology & Oncology

## 2024-02-03 ENCOUNTER — Encounter: Payer: Self-pay | Admitting: Hematology & Oncology

## 2024-02-03 VITALS — BP 69/48 | HR 94 | Resp 20 | Ht 66.0 in | Wt 129.4 lb

## 2024-02-03 DIAGNOSIS — Z923 Personal history of irradiation: Secondary | ICD-10-CM | POA: Insufficient documentation

## 2024-02-03 DIAGNOSIS — Z85828 Personal history of other malignant neoplasm of skin: Secondary | ICD-10-CM | POA: Insufficient documentation

## 2024-02-03 DIAGNOSIS — C12 Malignant neoplasm of pyriform sinus: Secondary | ICD-10-CM

## 2024-02-03 DIAGNOSIS — Z9221 Personal history of antineoplastic chemotherapy: Secondary | ICD-10-CM | POA: Insufficient documentation

## 2024-02-03 DIAGNOSIS — I5023 Acute on chronic systolic (congestive) heart failure: Secondary | ICD-10-CM

## 2024-02-03 DIAGNOSIS — D500 Iron deficiency anemia secondary to blood loss (chronic): Secondary | ICD-10-CM | POA: Diagnosis not present

## 2024-02-03 DIAGNOSIS — Z8546 Personal history of malignant neoplasm of prostate: Secondary | ICD-10-CM | POA: Insufficient documentation

## 2024-02-03 DIAGNOSIS — I255 Ischemic cardiomyopathy: Secondary | ICD-10-CM

## 2024-02-03 DIAGNOSIS — Z8639 Personal history of other endocrine, nutritional and metabolic disease: Secondary | ICD-10-CM | POA: Diagnosis not present

## 2024-02-03 DIAGNOSIS — C61 Malignant neoplasm of prostate: Secondary | ICD-10-CM

## 2024-02-03 DIAGNOSIS — D490 Neoplasm of unspecified behavior of digestive system: Secondary | ICD-10-CM

## 2024-02-03 LAB — CMP (CANCER CENTER ONLY)
ALT: 19 U/L (ref 0–44)
AST: 23 U/L (ref 15–41)
Albumin: 4.5 g/dL (ref 3.5–5.0)
Alkaline Phosphatase: 64 U/L (ref 38–126)
Anion gap: 15 (ref 5–15)
BUN: 37 mg/dL — ABNORMAL HIGH (ref 8–23)
CO2: 32 mmol/L (ref 22–32)
Calcium: 10.4 mg/dL — ABNORMAL HIGH (ref 8.9–10.3)
Chloride: 88 mmol/L — ABNORMAL LOW (ref 98–111)
Creatinine: 1.79 mg/dL — ABNORMAL HIGH (ref 0.61–1.24)
GFR, Estimated: 40 mL/min — ABNORMAL LOW (ref >60)
Glucose, Bld: 157 mg/dL — ABNORMAL HIGH (ref 70–99)
Potassium: 4 mmol/L (ref 3.5–5.1)
Sodium: 135 mmol/L (ref 135–145)
Total Bilirubin: 0.4 mg/dL (ref 0.0–1.2)
Total Protein: 8.3 g/dL — ABNORMAL HIGH (ref 6.5–8.1)

## 2024-02-03 LAB — CBC WITH DIFFERENTIAL (CANCER CENTER ONLY)
Abs Immature Granulocytes: 0.02 10*3/uL (ref 0.00–0.07)
Basophils Absolute: 0 10*3/uL (ref 0.0–0.1)
Basophils Relative: 1 %
Eosinophils Absolute: 0.4 10*3/uL (ref 0.0–0.5)
Eosinophils Relative: 9 %
HCT: 45.4 % (ref 39.0–52.0)
Hemoglobin: 13.6 g/dL (ref 13.0–17.0)
Immature Granulocytes: 0 %
Lymphocytes Relative: 27 %
Lymphs Abs: 1.3 10*3/uL (ref 0.7–4.0)
MCH: 26.1 pg (ref 26.0–34.0)
MCHC: 30 g/dL (ref 30.0–36.0)
MCV: 87.1 fL (ref 80.0–100.0)
Monocytes Absolute: 0.8 10*3/uL (ref 0.1–1.0)
Monocytes Relative: 16 %
Neutro Abs: 2.2 10*3/uL (ref 1.7–7.7)
Neutrophils Relative %: 47 %
Platelet Count: 192 10*3/uL (ref 150–400)
RBC: 5.21 MIL/uL (ref 4.22–5.81)
RDW: 17.2 % — ABNORMAL HIGH (ref 11.5–15.5)
WBC Count: 4.7 10*3/uL (ref 4.0–10.5)
nRBC: 0 % (ref 0.0–0.2)

## 2024-02-03 LAB — RETICULOCYTES
Immature Retic Fract: 10.1 % (ref 2.3–15.9)
RBC.: 5.15 MIL/uL (ref 4.22–5.81)
Retic Count, Absolute: 68 10*3/uL (ref 19.0–186.0)
Retic Ct Pct: 1.3 % (ref 0.4–3.1)

## 2024-02-03 LAB — LACTATE DEHYDROGENASE: LDH: 166 U/L (ref 98–192)

## 2024-02-03 LAB — FERRITIN: Ferritin: 314 ng/mL (ref 24–336)

## 2024-02-03 NOTE — Progress Notes (Signed)
 Hematology and Oncology Follow Up Visit  Collin Young [id number] 04-Jan-1951 73 y.o. 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  We last saw him back in April.  Since then, Collin Young has been doing pretty well.  Surprising, has been off Collin Young Duragesic  patch since March.  Had a very happy form about this.  Collin Young continues on the Eliquis .  I think Collin Young needs to be on lifelong Eliquis  because of congestive heart failure.  Collin Young has had no problems with congestive heart failure exacerbation.  Collin Young has had no issues with nausea or vomiting.  Collin Young has had no obvious change in bowel or bladder habits.  Collin Young last iron  studies that were done back in February showed a ferritin of 183 with an iron  saturation of 18%.  Currently, I would say Collin Young performance status is probably ECOG 2.    Wt Readings from Last 3 Encounters:  02/03/24 129 lb 6.4 oz (58.7 kg)  01/11/24 126 lb 9.6 oz (57.4 kg)  12/09/23 124 lb (56.2 kg)     Medications:  Current Outpatient Medications:    albuterol  (VENTOLIN  HFA) 108 (90 Base) MCG/ACT inhaler, Inhale 2 puffs into the lungs every 6 (six) hours as needed for wheezing or shortness of breath., Disp: 18 g, Rfl: 5   dapagliflozin  propanediol (FARXIGA ) 10 MG TABS tablet, Take 1 tablet (10 mg total) by mouth daily., Disp: 90 tablet, Rfl: 3   ELIQUIS  5 MG TABS tablet, TAKE 1 TABLET(5 MG) BY MOUTH TWICE DAILY, Disp: 60 tablet, Rfl: 5   Evolocumab  (REPATHA  SURECLICK) 140 MG/ML SOAJ, Inject 140 mg into the skin every 14 (fourteen) days. Please keep your  upcoming appointment for refills., Disp: 6 mL, Rfl: 3   furosemide  (LASIX ) 40 MG tablet, Take 2 tablets (80 mg total) by mouth 2 (two) times daily., Disp: 120 tablet, Rfl: 3   polyethylene glycol (MIRALAX  / GLYCOLAX ) 17 g packet, Take 17 g by mouth daily as needed for mild constipation., Disp: 14 each, Rfl: 0   potassium chloride  SA (KLOR-CON  M) 20 MEQ tablet, Take 1 tablet (20 mEq total) by mouth daily., Disp: 30 tablet, Rfl: 3   pravastatin  (PRAVACHOL ) 80 MG tablet, TAKE 1 TABLET(80 MG) BY MOUTH EVERY EVENING, Disp: 90 tablet, Rfl: 3   sacubitril -valsartan  (ENTRESTO ) 24-26 MG, Take 1 tablet by mouth 2 (two) times daily., Disp: 60 tablet, Rfl: 6   spironolactone  (ALDACTONE ) 25 MG tablet, Take 0.5 tablets (12.5 mg total) by mouth daily., Disp: 45 tablet, Rfl: 3   ipratropium (ATROVENT ) 0.03 % nasal spray, Place 2 sprays into both nostrils every 12 (twelve) hours. (Patient not taking: Reported on 02/03/2024), Disp: 30 mL, Rfl: 12 No current facility-administered medications for this visit.  Facility-Administered Medications Ordered in Other Visits:    topical emolient (BIAFINE) emulsion, , Topical, Daily, Johna Myers, MD, Given at 01/19/14 0912  Allergies: No Known Allergies  Past Medical History, Surgical history, Social history, and Family History were reviewed and updated.  Review of Systems: Review of Systems  Constitutional: Negative.   HENT: Negative.  Eyes: Negative.   Respiratory: Negative.    Cardiovascular: Negative.   Gastrointestinal: Negative.   Genitourinary: Negative.   Musculoskeletal: Negative.   Skin: Negative.   Neurological: Negative.   Endo/Heme/Allergies: Negative.   Psychiatric/Behavioral: Negative.     Physical Exam:  height is 5\' 6"  (1.676 m) and weight is 129 lb 6.4 oz (58.7 kg). Collin Young blood pressure is 69/48 (abnormal) and Collin Young pulse is 94. Collin Young respiration is 20 and oxygen saturation is 97%.   Physical Exam Vitals reviewed.  HENT:     Head: Normocephalic  and atraumatic.  Eyes:     Pupils: Pupils are equal, round, and reactive to light.  Cardiovascular:     Rate and Rhythm: Normal rate and regular rhythm.     Heart sounds: Normal heart sounds.  Pulmonary:     Effort: Pulmonary effort is normal. No respiratory distress.     Breath sounds: No stridor. Rhonchi (throughout) present. No wheezing or rales.  Abdominal:     General: Bowel sounds are normal.     Palpations: Abdomen is soft.  Musculoskeletal:        General: No tenderness or deformity. Normal range of motion.     Cervical back: Normal range of motion.     Right lower leg: No edema.     Left lower leg: No edema.  Lymphadenopathy:     Cervical: No cervical adenopathy.  Skin:    General: Skin is warm and dry.     Findings: No erythema or rash.  Neurological:     Mental Status: Collin Young is alert and oriented to person, place, and time.  Psychiatric:        Behavior: Behavior normal.        Thought Content: Thought content normal.        Judgment: Judgment normal.    Lab Results  Component Value Date   WBC 4.7 02/03/2024   HGB 13.6 02/03/2024   HCT 45.4 02/03/2024   MCV 87.1 02/03/2024   PLT 192 02/03/2024     Chemistry      Component Value Date/Time   NA 135 02/03/2024 1325   NA 135 05/29/2020 1556   NA 139 07/30/2017 0926   NA 137 03/10/2017 1000   K 4.0 02/03/2024 1325   K 3.9 07/30/2017 0926   K 4.3 03/10/2017 1000   CL 88 (L) 02/03/2024 1325   CL 96 (L) 07/30/2017 0926   CO2 32 02/03/2024 1325   CO2 33 07/30/2017 0926   CO2 29 03/10/2017 1000   BUN 37 (H) 02/03/2024 1325   BUN 23 05/29/2020 1556   BUN 17 07/30/2017 0926   BUN 14.7 03/10/2017 1000   CREATININE 1.79 (H) 02/03/2024 1325   CREATININE 1.4 (H) 07/30/2017 0926   CREATININE 1.1 03/10/2017 1000      Component Value Date/Time   CALCIUM  10.4 (H) 02/03/2024 1325   CALCIUM  9.6 07/30/2017 0926   CALCIUM  10.0 03/10/2017 1000   ALKPHOS 64 02/03/2024 1325   ALKPHOS 49 07/30/2017 0926   ALKPHOS 51  03/10/2017 1000   AST 23 02/03/2024 1325   AST 23 03/10/2017 1000   ALT 19 02/03/2024 1325   ALT 31 07/30/2017 0926   ALT 18 03/10/2017 1000   BILITOT 0.4 02/03/2024 1325   BILITOT 0.41 03/10/2017 1000      Impression and Plan: Collin Young is 73 year old gentleman. Collin Young had a localized recurrence of carcinoma of the left pyriform sinus. Collin Young underwent concurrent chemotherapy and radiation therapy. Collin Young finished  Collin Young treatment back in April of 2015.   Clinically, Collin Young continues to do well.  I really do not see a problem with Collin Young head neck cancer or esophageal cancer coming back.  I know that Collin Young has had prostate cancer.  We are checking Collin Young PSA.  I will have to get him through the summertime now.  We will get him back after Labor Day.     Ivor Mars 6/5/20252:13 PM

## 2024-02-03 NOTE — Addendum Note (Signed)
 Addended by: Gray Layman R on: 02/03/2024 02:44 PM   Modules accepted: Orders

## 2024-02-04 LAB — TESTOSTERONE: Testosterone: 716 ng/dL (ref 264–916)

## 2024-02-04 LAB — IRON AND IRON BINDING CAPACITY (CC-WL,HP ONLY)
Iron: 89 ug/dL (ref 45–182)
Saturation Ratios: 26 % (ref 17.9–39.5)
TIBC: 344 ug/dL (ref 250–450)
UIBC: 255 ug/dL (ref 117–376)

## 2024-02-05 LAB — PSA, TOTAL AND FREE
PSA, Free Pct: 7.3 %
PSA, Free: 0.19 ng/mL
Prostate Specific Ag, Serum: 2.6 ng/mL (ref 0.0–4.0)

## 2024-02-09 ENCOUNTER — Other Ambulatory Visit (HOSPITAL_COMMUNITY): Payer: Self-pay | Admitting: Internal Medicine

## 2024-02-16 ENCOUNTER — Ambulatory Visit (HOSPITAL_COMMUNITY)
Admission: RE | Admit: 2024-02-16 | Discharge: 2024-02-16 | Discharge status: Home / Self Care | Payer: 59 | Source: Ambulatory Visit | Attending: Vascular Surgery

## 2024-02-16 ENCOUNTER — Ambulatory Visit (INDEPENDENT_AMBULATORY_CARE_PROVIDER_SITE_OTHER): Payer: 59 | Admitting: Physician Assistant

## 2024-02-16 ENCOUNTER — Ambulatory Visit (HOSPITAL_COMMUNITY)
Admission: RE | Admit: 2024-02-16 | Discharge: 2024-02-16 | Disposition: A | Discharge status: Home / Self Care | Payer: 59 | Source: Ambulatory Visit | Attending: Vascular Surgery | Admitting: Vascular Surgery

## 2024-02-16 VITALS — BP 109/66 | HR 77 | Temp 98.0°F | Ht 66.0 in | Wt 127.2 lb

## 2024-02-16 DIAGNOSIS — I70213 Atherosclerosis of native arteries of extremities with intermittent claudication, bilateral legs: Secondary | ICD-10-CM | POA: Diagnosis present

## 2024-02-16 DIAGNOSIS — I6522 Occlusion and stenosis of left carotid artery: Secondary | ICD-10-CM

## 2024-02-16 LAB — VAS US ABI WITH/WO TBI
Left ABI: 0.68
Right ABI: 0.6

## 2024-02-16 NOTE — Progress Notes (Signed)
 Office Note     CC:  follow up Requesting Provider:  Kaylee Partridge, MD  HPI: Collin Young is a 73 y.o. (1951-03-19) male who presents for routine follow up of carotid artery disease and PAD. He has history of bilateral lower extremity claudication. He has not had any rest pain or tissue loss. 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 He does report some pain in his legs but its after quite a long time on his legs.   No previous stroke, TIA or amaurosis. He does have bilateral blurred vision. Has cataracts and reports needing to find new eye doctor. He denies any slurred speech, facial drooping, unilateral upper or lower extremity weakness or numbness. He did have radiation to his neck in the past for cancer and he was a previous smoker but has been quit for many years. Because of this he has hoarse voice. He has been having some dizziness recently.  Medically he is managed on Eliquis  and statin and Repatha   The pt is a statin for cholesterol management.   Also on Repatha  The pt is not on an aspirin .    Other AC:  Eliquis  The pt is on Entresto  for hypertension.  The pt does have diabetes. Tobacco hx:  former, quit 2001  Past Medical History:  Diagnosis Date   Chronic combined systolic and diastolic CHF (congestive heart failure) (HCC)    followed by dr t. Theodis Fiscal   COPD (chronic obstructive pulmonary disease) (HCC)    Coronary artery disease cardiologist-- dr Maudine Sos   per cardiac cath 01-05-2017  chronic total occlusion pLCx with collaterals, 99% severe calcified prox. to mid RCA, otherwise mild to moderate CAD (medically managed)   Esophageal cancer, stage IIIB [facility]) oncologist-  dr ennever/  dr moody   dx 2010  SCC Stage IIIB completed chemoradiation;  localized recurrent left piriform sinus 01/ 2015,  completed concurrent chemoradiatoin 04/ 2015   GERD (gastroesophageal reflux disease)    09-07-2018   no issues since Gtube removed 06/ 2019    History of alcohol  abuse    quit 2001   History of cancer chemotherapy    2010;   2015   History of radiation therapy    10-19-2013 to  12-05-2013 pyriform sinus 69.96 Gy/3fx;   Radiation completed 2010 for esophageal cancer   History of seizure 2001   alcohol  withdrawal   HOH (hard of hearing)    Hyperlipidemia    Hypertension    Iron  deficiency anemia due to chronic blood loss 11/14/2021   Ischemic cardiomyopathy 12/2016   01-03-2017  echo,  ef 10-15%/   echo 05-06-2017 EF improved to 45-50%   Prostate cancer [facility] urologist-  dr ottelin/  oncologist-- dr Lorri Rota   dx 05-27-2018--- Stage T1c,  Gleason 4+3,  PSA 9.3--  scheduled for brachytherapy 09-23-2017   Renal cyst, left     Past Surgical History:  Procedure Laterality Date   BIOPSY  12/18/2021   Procedure: BIOPSY;  Surgeon: Janel Medford, MD;  Location: [facility];  Service: Gastroenterology;;   CARDIOVASCULAR STRESS TEST  10/09/09   normal nuclearr stress test, EF 57% Jonny Neu)   CENTRAL LINE INSERTION  06/16/2021   Procedure: CENTRAL LINE INSERTION;  Surgeon: Mardell Shade, MD;  [facility];  Service: Cardiovascular;;   ESOPHAGOGASTRODUODENOSCOPY (EGD) WITH PROPOFOL  N/A 12/18/2021   Procedure: ESOPHAGOGASTRODUODENOSCOPY (EGD) WITH PROPOFOL ;  Surgeon: Janel Medford, MD;  Location: [facility];  Service:  Gastroenterology;  Laterality: N/A;   IR GASTROSTOMY TUBE MOD SED  01/13/2017   IR GASTROSTOMY TUBE REMOVAL  02/03/2018   IR PATIENT EVAL TECH 0-60 MINS  03/25/2017   IR REMOVAL TUN ACCESS W/ PORT W/O FL MOD SED  01/15/2017   IR REPLACE G-TUBE SIMPLE WO FLUORO  01/13/2018   LARYNGOSCOPY N/A 09/15/2013   Procedure: LARYNGOSCOPY;  Surgeon: Virgina Grills, MD;  Location: [facility] OR;  Service: ENT;  Laterality: N/A;  direct laryngoscopy with biopsy and esophagoscopy   RADIOACTIVE SEED IMPLANT N/A 09/23/2018   Procedure: RADIOACTIVE SEED IMPLANT/BRACHYTHERAPY IMPLANT;  Surgeon: Ottelin, Mark, MD;  Location:  [facility];  Service: Urology;  Laterality: N/A;   RIGHT HEART CATH N/A 06/16/2021   Procedure: RIGHT HEART CATH;  Surgeon: Mardell Shade, MD;  [facility];  Service: Cardiovascular;  Laterality: N/A;   RIGHT/LEFT HEART CATH AND CORONARY ANGIOGRAPHY N/A 01/05/2017   Procedure: Right/Left Heart Cath and Coronary Angiography;  Surgeon: Odie Benne, MD;  Location: [facility] INVASIVE CV LAB;  Service: Cardiovascular;  Laterality: N/A;   TRANSTHORACIC ECHOCARDIOGRAM  05/06/2017   ef 45-50%,  grade 1 diastolic dysfunction/  AV severe calcified non coronary cusp with moderate regurg. , no stenosis (valve area per echo 01-05-2017 1.08cm^2)/  mild MR, TR, and PR/  mild LAE    Social History   Socioeconomic History   Marital status: Legally Separated    Spouse name: Not on file   Number of children: 2   Years of education: Not on file   Highest education level: Not on file  Occupational History   Occupation: retired  Tobacco Use   Smoking status: Former    Current packs/day: 0.00    Average packs/day: 1 pack/day for 48.8 years (48.8 ttl pk-yrs)    Types: Cigarettes    Start date: 11/08/1960    Quit date: 08/31/2009    Years since quitting: 14.4   Smokeless tobacco: Former    Types: Chew    Quit date: 09/01/1999  Vaping Use   Vaping status: Never Used  Substance and Sexual Activity   Alcohol  use: Not Currently    Alcohol /week: 0.0 standard drinks of alcohol     Comment: quit in 2001   Drug use: No    Comment: back in the day used cocaine,alcohol , marijuana   Sexual activity: Not Currently    Partners: Female    Birth control/protection: None  Other Topics Concern   Not on file  Social History Narrative   Not on file   Social Drivers of Health   Financial Resource Strain: Low Risk  (07/20/2023)   Overall Financial Resource Strain (CARDIA)    Difficulty of Paying Living Expenses: Not hard at all  Food Insecurity: No Food Insecurity (11/01/2023)    Hunger Vital Sign    Worried About Running Out of Food in the Last Year: Never true    Ran Out of Food in the Last Year: Never true  Transportation Needs: No Transportation Needs (11/01/2023)   PRAPARE - Administrator, Civil Service (Medical): No    Lack of Transportation (Non-Medical): No  Physical Activity: Inactive (07/20/2023)   Exercise Vital Sign    Days of Exercise per Week: 0 days    Minutes of Exercise per Session: 0 min  Stress: No Stress Concern Present (07/20/2023)   [employer] of Occupational Health - Occupational Stress Questionnaire    Feeling of Stress : Not at all  Social Connections: Moderately  Integrated (10/26/2023)   Social Connection and Isolation Panel    Frequency of Communication with Friends and Family: More than three times a week    Frequency of Social Gatherings with Friends and Family: More than three times a week    Attends Religious Services: More than 4 times per year    Active Member of [employer] or Organizations: Yes    Attends Engineer, structural: More than 4 times per year    Marital Status: Divorced  Intimate Partner Violence: Not At Risk (11/01/2023)   Humiliation, Afraid, Rape, and Kick questionnaire    Fear of Current or Ex-Partner: No    Emotionally Abused: No    Physically Abused: No    Sexually Abused: No   *** Family History  Problem Relation Age of Onset   Breast cancer Mother    Prostate cancer Neg Hx    Colon cancer Neg Hx    Pancreatic cancer Neg Hx     Current Outpatient Medications  Medication Sig Dispense Refill   albuterol  (VENTOLIN  HFA) 108 (90 Base) MCG/ACT inhaler Inhale 2 puffs into the lungs every 6 (six) hours as needed for wheezing or shortness of breath. 18 g 5   apixaban  (ELIQUIS ) 5 MG TABS tablet TAKE 1 TABLET(5 MG) BY MOUTH TWICE DAILY 180 tablet 3   dapagliflozin  propanediol (FARXIGA ) 10 MG TABS tablet Take 1 tablet (10 mg total) by mouth daily. 90 tablet 3   Evolocumab  (REPATHA   SURECLICK) 140 MG/ML SOAJ Inject 140 mg into the skin every 14 (fourteen) days. Please keep your upcoming appointment for refills. 6 mL 3   furosemide  (LASIX ) 40 MG tablet Take 2 tablets (80 mg total) by mouth 2 (two) times daily. 120 tablet 3   ipratropium (ATROVENT ) 0.03 % nasal spray Place 2 sprays into both nostrils every 12 (twelve) hours. (Patient not taking: Reported on 02/03/2024) 30 mL 12   polyethylene glycol (MIRALAX  / GLYCOLAX ) 17 g packet Take 17 g by mouth daily as needed for mild constipation. 14 each 0   potassium chloride  SA (KLOR-CON  M) 20 MEQ tablet Take 1 tablet (20 mEq total) by mouth daily. 30 tablet 3   pravastatin  (PRAVACHOL ) 80 MG tablet TAKE 1 TABLET(80 MG) BY MOUTH EVERY EVENING 90 tablet 3   sacubitril -valsartan  (ENTRESTO ) 24-26 MG Take 1 tablet by mouth 2 (two) times daily. 60 tablet 6   spironolactone  (ALDACTONE ) 25 MG tablet Take 0.5 tablets (12.5 mg total) by mouth daily. 45 tablet 3   No current facility-administered medications for this visit.   Facility-Administered Medications Ordered in Other Visits  Medication Dose Route Frequency Provider Last Rate Last Admin   topical emolient (BIAFINE) emulsion   Topical Daily Johna Myers, MD   Given at 01/19/14 [phone]    No Known Allergies   REVIEW OF SYSTEMS:  *** [X]  denotes positive finding, [ ]  denotes negative finding Cardiac  Comments:  Chest pain or chest pressure:    Shortness of breath upon exertion:    Short of breath when lying flat:    Irregular heart rhythm:        Vascular    Pain in calf, thigh, or hip brought on by ambulation:    Pain in feet at night that wakes you up from your sleep:     Blood clot in your veins:    Leg swelling:         Pulmonary    Oxygen at home:    Productive cough:  Wheezing:         Neurologic    Sudden weakness in arms or legs:     Sudden numbness in arms or legs:     Sudden onset of difficulty speaking or slurred speech:    Temporary loss of vision in one  eye:     Problems with dizziness:         Gastrointestinal    Blood in stool:     Vomited blood:         Genitourinary    Burning when urinating:     Blood in urine:        Psychiatric    Major depression:         Hematologic    Bleeding problems:    Problems with blood clotting too easily:        Skin    Rashes or ulcers:        Constitutional    Fever or chills:      PHYSICAL EXAMINATION:  There were no vitals filed for this visit.  General:  WDWN in NAD; vital signs documented above Gait: Not observed HENT: WNL, normocephalic Pulmonary: normal non-labored breathing Cardiac: regular HR Abdomen: soft Vascular Exam/Pulses: *** Extremities: {With/Without:20273} ischemic changes, {With/Without:20273} Gangrene , {With/Without:20273} cellulitis; {With/Without:20273} open wounds;  Musculoskeletal: no muscle wasting or atrophy  Neurologic: A&O X 3 Psychiatric:  The pt has Normal affect.   Non-Invasive Vascular Imaging:   VAS US  Carotid duplex: Summary:  Right Carotid: Velocities in the right ICA are consistent with a 1-39% stenosis.   Left Carotid: Velocities in the left ICA are consistent with a 60-79% stenosis.   Vertebrals: Bilateral vertebral arteries demonstrate antegrade flow.  Subclavians: Normal flow hemodynamics were seen in bilateral subclavian arteries.   +-------+-----------+-----------+------------+------------+  ABI/TBIToday's ABIToday's TBIPrevious ABIPrevious TBI  +-------+-----------+-----------+------------+------------+  Right 0.6        0          0.48        0.23          +-------+-----------+-----------+------------+------------+  Left  0.68       0          0.66        0.4           +-------+-----------+-----------+------------+------------+   ASSESSMENT/PLAN:: 73 y.o. male here for follow up for ***   -ABI's are stable bilaterally but TBI's have decreased and are now absent however patients blood pressure today is very  low and I suspect this is contributing to his decreased numbers today    Deneen Finical, PA-C [facility] [phone]  Clinic MD:   Vikki Graves

## 2024-02-17 ENCOUNTER — Encounter: Payer: Self-pay | Admitting: Physician Assistant

## 2024-02-17 ENCOUNTER — Other Ambulatory Visit: Payer: Self-pay | Admitting: *Deleted

## 2024-02-17 DIAGNOSIS — I70213 Atherosclerosis of native arteries of extremities with intermittent claudication, bilateral legs: Secondary | ICD-10-CM

## 2024-02-17 DIAGNOSIS — I6522 Occlusion and stenosis of left carotid artery: Secondary | ICD-10-CM

## 2024-02-20 NOTE — Progress Notes (Deleted)
 [facility] [address] [place], [place] [address] [phone]  Date:  02/24/2024   Name:  Collin Young   DOB:  05/02/1951   MRN:  [id number]  PCP:  Watt Harlene BROCKS, MD    Chief Complaint: No chief complaint on file.   History of Present Illness:  Collin Young is a 73 y.o. very pleasant male patient who presents with the following:  Pt seen today with concern about his BP Last seen by myself in March for CHF exacerbation   Seen by CHF clinic in May: 1. Chronic Biventricular HF - EF remained 20-30% from 2021 to 2023, when it improved to 40-45%. - Echo (9/21): EF down again, 35-40%. RV normal  - Echo 2/25: EF down 20-25% RV moderately reduced + WMA. Dr Cherrie  reviewed previous cath. No targets.  - Improved NYHA II. Volume looks good today - Hold off on adding bisoprolol  with dizziness & marginal BP - Continue Lasix  80 mg bid + 20 KCL daily. - Continue spiro 12.5 mg daily. - Continue Entresto  24/26 mg bid. - Continue Farxiga  10 mg daily.  - Repeat echo next visit. - Labs today. 2. HTN  - BP controlled - Continue current meds 3. CAD - Cath 2018 severe Prox -Mid CTO proximal circumflex. Mod left main and mid LAD.  - No chest pain. - Continue pravastatin . - No ASA with Eliquis .   4. CKD Stage IIIa - Baseline SCr 1.2-1.3. - Continue Farxiga  - BMET today. 5. GOC - He would benefit from outpatient Palliative Care given COPD/HF and declining condition.    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 - completed therapy in April 2015 Stage IIIB squamous cell carcinoma of the esophagus-remission - completed therapy in 2010 Congestive heart failure Stage T1c prostate cancer Pulmonary Embolism -- 2022   Current Therapy:        Status post prostate brachytherapy  Eliquis  5 mg po BID IV Iron  PRN- Venofer - last dose  10/21/2023    Patient Active Problem List   Diagnosis Date Noted   Acute on chronic congestive heart failure (HCC) 10/25/2023   Iron  deficiency anemia due to chronic blood loss 11/14/2021   Acute on chronic systolic CHF (congestive heart failure) (HCC) 07/07/2021   COPD (chronic obstructive pulmonary disease) (HCC)    Hyponatremia    SOB (shortness of breath)    DNR (do not resuscitate)    Weakness generalized    Elevated LFTs    Pulmonary embolism (HCC) 06/11/2021   GERD without esophagitis 05/08/2021   Mixed hyperlipidemia 05/08/2021   Hypertensive urgency 05/08/2021   Elevated troponin level not due myocardial infarction 05/08/2021   Pure hypercholesterolemia 09/23/2020   Claudication in peripheral vascular disease (HCC) 01/25/2020   Late latent syphilis 01/15/2020   Coronary artery disease involving native coronary artery of native heart 07/05/2019   Chronic kidney disease, stage 3a (HCC) 07/05/2019   Combined systolic and diastolic heart failure (HCC) 09/26/2018   Malignant neoplasm of prostate (HCC) 06/17/2018   Medication management    Dysphagia    Palliative care by specialist    Ischemic cardiomyopathy    Congestive dilated cardiomyopathy (HCC)    Acute on chronic systolic congestive heart failure (HCC) 01/02/2017   Malignant neoplasm of pyriform sinus (HCC) 09/28/2013   Piriform sinus tumor 09/24/2013   NONSPECIFIC ABN FINDING RAD &  OTH EXAM GI TRACT 05/14/2009    Past Medical History:  Diagnosis Date   Chronic combined systolic and diastolic CHF (congestive heart failure) (HCC)    followed by dr t. raford   COPD (chronic obstructive pulmonary disease) (HCC)    Coronary artery disease cardiologist-- dr annabella raford   per cardiac cath 01-05-2017  chronic total occlusion pLCx with collaterals, 99% severe calcified prox. to mid RCA, otherwise mild to moderate CAD (medically managed)   Esophageal cancer, stage IIIB [facility]) oncologist-  dr ennever/  dr moody   dx  2010  SCC Stage IIIB completed chemoradiation;  localized recurrent left piriform sinus 01/ 2015,  completed concurrent chemoradiatoin 04/ 2015   GERD (gastroesophageal reflux disease)    09-07-2018   no issues since Gtube removed 06/ 2019   History of alcohol  abuse    quit 2001   History of cancer chemotherapy    2010;   2015   History of radiation therapy    10-19-2013 to  12-05-2013 pyriform sinus 69.96 Gy/28fx;   Radiation completed 2010 for esophageal cancer   History of seizure 2001   alcohol  withdrawal   HOH (hard of hearing)    Hyperlipidemia    Hypertension    Iron  deficiency anemia due to chronic blood loss 11/14/2021   Ischemic cardiomyopathy 12/2016   01-03-2017  echo,  ef 10-15%/   echo 05-06-2017 EF improved to 45-50%   Prostate cancer [facility]) urologist-  dr ottelin/  oncologist-- dr patrcia   dx 05-27-2018--- Stage T1c,  Gleason 4+3,  PSA 9.3--  scheduled for brachytherapy 09-23-2017   Renal cyst, left     Past Surgical History:  Procedure Laterality Date   BIOPSY  12/18/2021   Procedure: BIOPSY;  Surgeon: Teressa Toribio SQUIBB, MD;  Location: [facility];  Service: Gastroenterology;;   CARDIOVASCULAR STRESS TEST  10/09/09   normal nuclearr stress test, EF 57% Rendell First)   CENTRAL LINE INSERTION  06/16/2021   Procedure: CENTRAL LINE INSERTION;  Surgeon: Cherrie Toribio SAUNDERS, MD;  [facility];  Service: Cardiovascular;;   ESOPHAGOGASTRODUODENOSCOPY (EGD) WITH PROPOFOL  N/A 12/18/2021   Procedure: ESOPHAGOGASTRODUODENOSCOPY (EGD) WITH PROPOFOL ;  Surgeon: Teressa Toribio SQUIBB, MD;  Location: [facility];  Service: Gastroenterology;  Laterality: N/A;   IR GASTROSTOMY TUBE MOD SED  01/13/2017   IR GASTROSTOMY TUBE REMOVAL  02/03/2018   IR PATIENT EVAL TECH 0-60 MINS  03/25/2017   IR REMOVAL TUN ACCESS W/ PORT W/O FL MOD SED  01/15/2017   IR REPLACE G-TUBE SIMPLE WO FLUORO  01/13/2018   LARYNGOSCOPY N/A 09/15/2013   Procedure: LARYNGOSCOPY;  Surgeon: Vaughan Ricker, MD;   Location: [facility] OR;  Service: ENT;  Laterality: N/A;  direct laryngoscopy with biopsy and esophagoscopy   RADIOACTIVE SEED IMPLANT N/A 09/23/2018   Procedure: RADIOACTIVE SEED IMPLANT/BRACHYTHERAPY IMPLANT;  Surgeon: Ottelin, Mark, MD;  Location: [facility];  Service: Urology;  Laterality: N/A;   RIGHT HEART CATH N/A 06/16/2021   Procedure: RIGHT HEART CATH;  Surgeon: Cherrie Toribio SAUNDERS, MD;  [facility];  Service: Cardiovascular;  Laterality: N/A;   RIGHT/LEFT HEART CATH AND CORONARY ANGIOGRAPHY N/A 01/05/2017   Procedure: Right/Left Heart Cath and Coronary Angiography;  Surgeon: [name] Lonni BIRCH, MD;  Location: [facility] INVASIVE CV LAB;  Service: Cardiovascular;  Laterality: N/A;   TRANSTHORACIC ECHOCARDIOGRAM  05/06/2017   ef 45-50%,  grade 1 diastolic dysfunction/  AV severe calcified non coronary cusp with moderate regurg. , no stenosis (valve area per  echo 01-05-2017 1.08cm^2)/  mild MR, TR, and PR/  mild LAE    Social History   Tobacco Use   Smoking status: Former    Current packs/day: 0.00    Average packs/day: 1 pack/day for 48.8 years (48.8 ttl pk-yrs)    Types: Cigarettes    Start date: 11/08/1960    Quit date: 08/31/2009    Years since quitting: 14.4   Smokeless tobacco: Former    Types: Chew    Quit date: 09/01/1999  Vaping Use   Vaping status: Never Used  Substance Use Topics   Alcohol  use: Not Currently    Alcohol /week: 0.0 standard drinks of alcohol     Comment: quit in 2001   Drug use: No    Comment: back in the day used cocaine,alcohol , marijuana    Family History  Problem Relation Age of Onset   Breast cancer Mother    Prostate cancer Neg Hx    Colon cancer Neg Hx    Pancreatic cancer Neg Hx     No Known Allergies  Medication list has been reviewed and updated.  Current Outpatient Medications on File Prior to Visit  Medication Sig Dispense Refill   albuterol  (VENTOLIN  HFA) 108 (90 Base) MCG/ACT inhaler Inhale 2 puffs into the  lungs every 6 (six) hours as needed for wheezing or shortness of breath. 18 g 5   apixaban  (ELIQUIS ) 5 MG TABS tablet TAKE 1 TABLET(5 MG) BY MOUTH TWICE DAILY 180 tablet 3   dapagliflozin  propanediol (FARXIGA ) 10 MG TABS tablet Take 1 tablet (10 mg total) by mouth daily. 90 tablet 3   Evolocumab  (REPATHA  SURECLICK) 140 MG/ML SOAJ Inject 140 mg into the skin every 14 (fourteen) days. Please keep your upcoming appointment for refills. 6 mL 3   furosemide  (LASIX ) 40 MG tablet Take 2 tablets (80 mg total) by mouth 2 (two) times daily. 120 tablet 3   ipratropium (ATROVENT ) 0.03 % nasal spray Place 2 sprays into both nostrils every 12 (twelve) hours. 30 mL 12   polyethylene glycol (MIRALAX  / GLYCOLAX ) 17 g packet Take 17 g by mouth daily as needed for mild constipation. 14 each 0   potassium chloride  SA (KLOR-CON  M) 20 MEQ tablet Take 1 tablet (20 mEq total) by mouth daily. 30 tablet 3   pravastatin  (PRAVACHOL ) 80 MG tablet TAKE 1 TABLET(80 MG) BY MOUTH EVERY EVENING 90 tablet 3   sacubitril -valsartan  (ENTRESTO ) 24-26 MG Take 1 tablet by mouth 2 (two) times daily. 60 tablet 6   spironolactone  (ALDACTONE ) 25 MG tablet Take 0.5 tablets (12.5 mg total) by mouth daily. 45 tablet 3   Current Facility-Administered Medications on File Prior to Visit  Medication Dose Route Frequency Provider Last Rate Last Admin   topical emolient (BIAFINE) emulsion   Topical Daily Dewey Rush, MD   Given at 01/19/14 9087    Review of Systems:  As per HPI- otherwise negative.   Physical Examination: There were no vitals filed for this visit. There were no vitals filed for this visit. There is no height or weight on file to calculate BMI. Ideal Body Weight:    GEN: no acute distress. HEENT: Atraumatic, Normocephalic.  Ears and Nose: No external deformity. CV: RRR, No M/G/R. No JVD. No thrill. No extra heart sounds. PULM: CTA B, no wheezes, crackles, rhonchi. No retractions. No resp. distress. No accessory muscle  use. ABD: S, NT, ND, +BS. No rebound. No HSM. EXTR: No c/c/e PSYCH: Normally interactive. Conversant.    Assessment and Plan: ***  Signed Harlene Schroeder, MD

## 2024-02-24 ENCOUNTER — Ambulatory Visit: Admitting: Family Medicine

## 2024-03-01 ENCOUNTER — Other Ambulatory Visit (HOSPITAL_COMMUNITY): Payer: Self-pay

## 2024-03-02 ENCOUNTER — Telehealth (HOSPITAL_COMMUNITY): Payer: Self-pay | Admitting: Pharmacy Technician

## 2024-03-02 LAB — HM DIABETES EYE EXAM

## 2024-03-02 NOTE — Telephone Encounter (Signed)
 Pharmacy Patient Advocate Encounter  Insurance verification completed.   The patient is insured through [facility]   Ran test claim for Eliquis . Currently a quantity of 180 is a 90 day supply and the co-pay is $0 .  This test claim was processed through [facility]- copay amounts may vary at other pharmacies due to pharmacy/plan contracts, or as the patient moves through the different stages of their insurance plan.   Called patients pharmacy since they advised the patient it needed a PA. They were able to rerun the claim without issue. Called and updated the patients daughter.  Almarie JULIANNA Pa, CPhT

## 2024-03-13 ENCOUNTER — Ambulatory Visit (HOSPITAL_COMMUNITY)
Admission: RE | Admit: 2024-03-13 | Discharge: 2024-03-13 | Disposition: A | Discharge status: Home / Self Care | Source: Ambulatory Visit | Attending: Internal Medicine | Admitting: Internal Medicine

## 2024-03-13 ENCOUNTER — Encounter (HOSPITAL_COMMUNITY): Payer: Self-pay | Admitting: Internal Medicine

## 2024-03-13 VITALS — BP 98/54 | HR 91 | Wt 130.0 lb

## 2024-03-13 DIAGNOSIS — Z923 Personal history of irradiation: Secondary | ICD-10-CM | POA: Insufficient documentation

## 2024-03-13 DIAGNOSIS — Z9221 Personal history of antineoplastic chemotherapy: Secondary | ICD-10-CM | POA: Insufficient documentation

## 2024-03-13 DIAGNOSIS — Z87891 Personal history of nicotine dependence: Secondary | ICD-10-CM | POA: Diagnosis not present

## 2024-03-13 DIAGNOSIS — I5082 Biventricular heart failure: Secondary | ICD-10-CM | POA: Diagnosis not present

## 2024-03-13 DIAGNOSIS — I255 Ischemic cardiomyopathy: Secondary | ICD-10-CM | POA: Insufficient documentation

## 2024-03-13 DIAGNOSIS — J449 Chronic obstructive pulmonary disease, unspecified: Secondary | ICD-10-CM | POA: Insufficient documentation

## 2024-03-13 DIAGNOSIS — I351 Nonrheumatic aortic (valve) insufficiency: Secondary | ICD-10-CM | POA: Insufficient documentation

## 2024-03-13 DIAGNOSIS — E785 Hyperlipidemia, unspecified: Secondary | ICD-10-CM | POA: Diagnosis not present

## 2024-03-13 DIAGNOSIS — N1831 Chronic kidney disease, stage 3a: Secondary | ICD-10-CM | POA: Insufficient documentation

## 2024-03-13 DIAGNOSIS — I2510 Atherosclerotic heart disease of native coronary artery without angina pectoris: Secondary | ICD-10-CM | POA: Diagnosis not present

## 2024-03-13 DIAGNOSIS — Z8501 Personal history of malignant neoplasm of esophagus: Secondary | ICD-10-CM | POA: Diagnosis not present

## 2024-03-13 DIAGNOSIS — Z8546 Personal history of malignant neoplasm of prostate: Secondary | ICD-10-CM | POA: Insufficient documentation

## 2024-03-13 DIAGNOSIS — Z7901 Long term (current) use of anticoagulants: Secondary | ICD-10-CM | POA: Diagnosis not present

## 2024-03-13 DIAGNOSIS — I130 Hypertensive heart and chronic kidney disease with heart failure and stage 1 through stage 4 chronic kidney disease, or unspecified chronic kidney disease: Secondary | ICD-10-CM | POA: Diagnosis not present

## 2024-03-13 DIAGNOSIS — I5042 Chronic combined systolic (congestive) and diastolic (congestive) heart failure: Secondary | ICD-10-CM

## 2024-03-13 LAB — BASIC METABOLIC PANEL WITH GFR
Anion gap: 14 (ref 5–15)
BUN: 42 mg/dL — ABNORMAL HIGH (ref 8–23)
CO2: 32 mmol/L (ref 22–32)
Calcium: 10.2 mg/dL (ref 8.9–10.3)
Chloride: 89 mmol/L — ABNORMAL LOW (ref 98–111)
Creatinine, Ser: 1.69 mg/dL — ABNORMAL HIGH (ref 0.61–1.24)
GFR, Estimated: 42 mL/min — ABNORMAL LOW (ref >60)
Glucose, Bld: 114 mg/dL — ABNORMAL HIGH (ref 70–99)
Potassium: 5.2 mmol/L — ABNORMAL HIGH (ref 3.5–5.1)
Sodium: 135 mmol/L (ref 135–145)

## 2024-03-13 LAB — ECHOCARDIOGRAM COMPLETE
AR max vel: 2.02 cm2
AV Area VTI: 2.35 cm2
AV Area mean vel: 1.97 cm2
AV Mean grad: 3 mmHg
AV Peak grad: 7.5 mmHg
Ao pk vel: 1.37 m/s
Area-P 1/2: 5.16 cm2
S' Lateral: 4.5 cm

## 2024-03-13 LAB — BRAIN NATRIURETIC PEPTIDE: B Natriuretic Peptide: 484.5 pg/mL — ABNORMAL HIGH (ref 0.0–100.0)

## 2024-03-13 MED ORDER — FUROSEMIDE 40 MG PO TABS: 80.0000 mg | ORAL_TABLET | Freq: Every day | ORAL | Status: AC

## 2024-03-13 MED ORDER — PERFLUTREN LIPID MICROSPHERE
1.0000 mL | INTRAVENOUS | Status: DC | PRN
  Administered 2024-03-13: 2 mL via INTRAVENOUS

## 2024-03-13 NOTE — Progress Notes (Signed)
 PCP: Watt Harlene BROCKS, MD HF MD: Dr Cherrie  HPI: Collin Young is a 73 y.o. male w/ chronic combined systolic and diastolic CHF/ ischemic CM, severe multivessel CAD treated medically, 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     HF dates back to 12/2016. Echo then w/ severely reduced LVEF 10-15%, RV mild-mod reduced, mod AI/MR/TR. LHC showed severe calcific disease in the proximal to mid RCA and chronic total occlusion of the proximal circumflex.There was consideration of RCA PCI with recommendations for medical management.    Admitted  9/22 with HF. Echo EF down 25-30%, RV not well visualized. Mod MR/AI. Chest CT showed  large right pleural effusion.  There was questionable PE on his CT but ultimately this was thought to be just artifact with poor opacification. Given w/ IV Lasix  and discharged home on Entresto , Farxiga , Toprol  XL and PRN lasix .    Admitted 06/11/21 with HF and acute hypoxic respiratory failure.Complicated by PE, pleural effusion, and cardiogenic shock. Diuresed with IV lasix .  Limited Echo EF 20%, global HK, RV normal.  RHC cardiogenic shock with R>L heart failure. GDMT titrated. Not felt to be a VAD candidate due to cachexia and RV failure. Discharged home, weight 121 lbs.    Admitted 07/07/21 with HF. Echo EF 25-30%, severe LV dysfunction, grade I DD, RV mildly reduced.  Diuresed with IV lasix  and transitioned to po lasix  40 mg daily. Continued on GDMT, discharge weight 122 lbs.   Echo 2/23: EF 40-45%  Echo 5/24 EF 35-40%, inferior AK mild MR  Admitted 2/25 with a/c HF. Diuresed with IV lasix  and transitioned to lasix  40 mg po daily. Cath films reviewed. No targets for PCI. Plan for medical management. GDMT titrated and discharged home, weight 117 lbs (Not sure this was accurate given previous day weight was 128 lbs).  Used Furoscix  x 2 doses 11/04/23.  Today he returns for HF follow  up. Doing fairly well. C/o dizziness at times when he stands. No CP. Mild DOE No edema, orthopnea or PND. Compliant with meds  Echo today 03/13/24 EF 40-45% RV ok   Cardiac Studies Echo 5/24 EF 35-40%, inferior AK mild MR Echo 2/23: EF 40-45% Echo 11/22: EF 25-30%, global HK, RV mildly reduced. Echo 10/22: EF 20%, global HK, RV mildly reduced Echo 9/21: EF down again, 35-40%. RV normal  Echo 9/18: EF improved up to 45-50%, Mod AI. RV normal.  Echo 2018: EF 15%.    - cMRI (12/22) unable to quanify EF, LGE with prior infarct basal to mid inferior/inferolateral walls. LGE is greater than 50% transmural suggesting territory is not viable  ROS: All systems negative except as listed in HPI, PMH and Problem List.  SH:  Social History   Socioeconomic History   Marital status: Legally Separated    Spouse name: Not on file   Number of children: 2   Years of education: Not on file   Highest education level: Not on file  Occupational History   Occupation: retired  Tobacco Use   Smoking status: Former    Current packs/day: 0.00    Average packs/day: 1 pack/day for 48.8 years (48.8 ttl pk-yrs)    Types: Cigarettes    Start date: 11/08/1960    Quit date: 08/31/2009    Years since quitting: 14.5   Smokeless tobacco: Former    Types: Chew    Quit date: 09/01/1999  Vaping Use  Vaping status: Never Used  Substance and Sexual Activity   Alcohol  use: Not Currently    Alcohol /week: 0.0 standard drinks of alcohol     Comment: quit in 2001   Drug use: No    Comment: back in the day used cocaine,alcohol , marijuana   Sexual activity: Not Currently    Partners: Female    Birth control/protection: None  Other Topics Concern   Not on file  Social History Narrative   Not on file   Social Drivers of Health   Financial Resource Strain: Low Risk  (07/20/2023)   Overall Financial Resource Strain (CARDIA)    Difficulty of Paying Living Expenses: Not hard at all  Food Insecurity: No Food Insecurity  (11/01/2023)   Hunger Vital Sign    Worried About Running Out of Food in the Last Year: Never true    Ran Out of Food in the Last Year: Never true  Transportation Needs: No Transportation Needs (11/01/2023)   PRAPARE - Administrator, Civil Service (Medical): No    Lack of Transportation (Non-Medical): No  Physical Activity: Inactive (07/20/2023)   Exercise Vital Sign    Days of Exercise per Week: 0 days    Minutes of Exercise per Session: 0 min  Stress: No Stress Concern Present (07/20/2023)   [employer] of Occupational Health - Occupational Stress Questionnaire    Feeling of Stress : Not at all  Social Connections: Moderately Integrated (10/26/2023)   Social Connection and Isolation Panel    Frequency of Communication with Friends and Family: More than three times a week    Frequency of Social Gatherings with Friends and Family: More than three times a week    Attends Religious Services: More than 4 times per year    Active Member of [employer] or Organizations: Yes    Attends Engineer, structural: More than 4 times per year    Marital Status: Divorced  Intimate Partner Violence: Not At Risk (11/01/2023)   Humiliation, Afraid, Rape, and Kick questionnaire    Fear of Current or Ex-Partner: No    Emotionally Abused: No    Physically Abused: No    Sexually Abused: No   FH:  Family History  Problem Relation Age of Onset   Breast cancer Mother    Prostate cancer Neg Hx    Colon cancer Neg Hx    Pancreatic cancer Neg Hx    Past Medical History:  Diagnosis Date   Chronic combined systolic and diastolic CHF (congestive heart failure) (HCC)    followed by dr t. raford   COPD (chronic obstructive pulmonary disease) (HCC)    Coronary artery disease cardiologist-- dr annabella raford   per cardiac cath 01-05-2017  chronic total occlusion pLCx with collaterals, 99% severe calcified prox. to mid RCA, otherwise mild to moderate CAD (medically managed)   Esophageal  cancer, stage IIIB [facility]) oncologist-  dr ennever/  dr moody   dx 2010  SCC Stage IIIB completed chemoradiation;  localized recurrent left piriform sinus 01/ 2015,  completed concurrent chemoradiatoin 04/ 2015   GERD (gastroesophageal reflux disease)    09-07-2018   no issues since Gtube removed 06/ 2019   History of alcohol  abuse    quit 2001   History of cancer chemotherapy    2010;   2015   History of radiation therapy    10-19-2013 to  12-05-2013 pyriform sinus 69.96 Gy/31fx;   Radiation completed 2010 for esophageal cancer   History of seizure 2001  alcohol  withdrawal   HOH (hard of hearing)    Hyperlipidemia    Hypertension    Iron  deficiency anemia due to chronic blood loss 11/14/2021   Ischemic cardiomyopathy 12/2016   01-03-2017  echo,  ef 10-15%/   echo 05-06-2017 EF improved to 45-50%   Prostate cancer [facility]) urologist-  dr ottelin/  oncologist-- dr patrcia   dx 05-27-2018--- Stage T1c,  Gleason 4+3,  PSA 9.3--  scheduled for brachytherapy 09-23-2017   Renal cyst, left    Current Outpatient Medications  Medication Sig Dispense Refill   albuterol  (VENTOLIN  HFA) 108 (90 Base) MCG/ACT inhaler Inhale 2 puffs into the lungs every 6 (six) hours as needed for wheezing or shortness of breath. 18 g 5   apixaban  (ELIQUIS ) 5 MG TABS tablet TAKE 1 TABLET(5 MG) BY MOUTH TWICE DAILY 180 tablet 3   dapagliflozin  propanediol (FARXIGA ) 10 MG TABS tablet Take 1 tablet (10 mg total) by mouth daily. 90 tablet 3   Evolocumab  (REPATHA  SURECLICK) 140 MG/ML SOAJ Inject 140 mg into the skin every 14 (fourteen) days. Please keep your upcoming appointment for refills. 6 mL 3   furosemide  (LASIX ) 40 MG tablet Take 2 tablets (80 mg total) by mouth 2 (two) times daily. 120 tablet 3   ipratropium (ATROVENT ) 0.03 % nasal spray Place 2 sprays into both nostrils every 12 (twelve) hours. 30 mL 12   polyethylene glycol (MIRALAX  / GLYCOLAX ) 17 g packet Take 17 g by mouth daily as needed for mild constipation. 14  each 0   potassium chloride  SA (KLOR-CON  M) 20 MEQ tablet Take 1 tablet (20 mEq total) by mouth daily. 30 tablet 3   pravastatin  (PRAVACHOL ) 80 MG tablet TAKE 1 TABLET(80 MG) BY MOUTH EVERY EVENING 90 tablet 3   sacubitril -valsartan  (ENTRESTO ) 24-26 MG Take 1 tablet by mouth 2 (two) times daily. 60 tablet 6   spironolactone  (ALDACTONE ) 25 MG tablet Take 0.5 tablets (12.5 mg total) by mouth daily. 45 tablet 3   No current facility-administered medications for this encounter.   Facility-Administered Medications Ordered in Other Encounters  Medication Dose Route Frequency Provider Last Rate Last Admin   perflutren  lipid microspheres (DEFINITY ) IV suspension  1-10 mL Intravenous PRN Briza Bark, Toribio SAUNDERS, MD   2 mL at 03/13/24 1336   topical emolient (BIAFINE) emulsion   Topical Daily Dewey Rush, MD   Given at 01/19/14 0912   BP (!) 98/54   Pulse 91   Wt 59 kg (130 lb)   SpO2 97%   BMI 20.98 kg/m   Wt Readings from Last 3 Encounters:  03/13/24 59 kg (130 lb)  02/16/24 57.7 kg (127 lb 3.2 oz)  02/03/24 58.7 kg (129 lb 6.4 oz)   PHYSICAL EXAM: General:  Thin male. Hoarse voice No resp difficulty HEENT: normal Neck: supple. no JVD. Carotids 2+ bilat; no bruits. No lymphadenopathy or thryomegaly appreciated. Cor: PMI nondisplaced. Regular rate & rhythm. No rubs, gallops or murmurs. Lungs: clear Abdomen: soft, nontender, nondistended. No hepatosplenomegaly. No bruits or masses. Good bowel sounds. Extremities: no cyanosis, clubbing, rash, edema Neuro: alert & orientedx3, cranial nerves grossly intact. moves all 4 extremities w/o difficulty. Affect pleasant  ASSESSMENT & PLAN: 1. Chronic Biventricular HF - EF remained 20-30% from 2021 to 2023, when it improved to 40-45%. - Echo (9/21): EF down again, 35-40%. RV normal  - Echo 2/25: EF down 20-25% RV moderately reduced + WMA. - Echo today 03/13/24 EF 40-45% RV ok  - Stable NYHA II volume looks dry (  ReDS 28%) and he appears orthostatic -  Drop lasix  80 bid -> 80 daily - Continue spiro 12.5 mg daily. - Continue Entresto  24/26 mg bid. - Continue Farxiga  10 mg daily. - No b-blocker yet with recovered EF and low BP/orthostasis  - Labs today.  2. HTN  - BP low - see meds above   3. CAD - Cath 2018 severe Prox -Mid CTO proximal circumflex. Mod left main and mid LAD.  - No s/s angina - Continue pravastatin . - No ASA with Eliquis .    4. CKD Stage IIIa - Baseline SCr 1.2-1.3. - Continue Farxiga  - Labs today    Toribio Fuel MD 2:11 PM

## 2024-03-13 NOTE — Patient Instructions (Signed)
 Medication Changes:  DECREASE Furosemide  to 80 mg (2 tabs) Daily  Lab Work:  Labs done today, your results will be available in MyChart, we will contact you for abnormal readings.  Special Instructions // Education:  Do the following things EVERYDAY: Weigh yourself in the morning before breakfast. Write it down and keep it in a log. Take your medicines as prescribed Eat low salt foods--Limit salt (sodium) to 2000 mg per day.  Stay as active as you can everyday Limit all fluids for the day to less than 2 liters   Follow-Up in: 6 months   At the [facility], you and your health needs are our priority. We have a designated team specialized in the treatment of Heart Failure. This Care Team includes your primary Heart Failure Specialized Cardiologist (physician), Advanced Practice Providers (APPs- Physician Assistants and Nurse Practitioners), and Pharmacist who all work together to provide you with the care you need, when you need it.   You may see any of the following providers on your designated Care Team at your next follow up:  Dr. Toribio Fuel Dr. Ezra Shuck Dr. Ria Commander Dr. Odis Brownie Greig Mosses, NP Caffie Shed, [place] [facility] [place], [place] Beckey Coe, NP [place] Lee, NP Tinnie Redman, PharmD   Please be sure to bring in all your medications bottles to every appointment.   Need to Contact Us :  If you have any questions or concerns before your next appointment please send us  a message through [place] or call our office at [phone].    TO LEAVE A MESSAGE FOR THE NURSE SELECT OPTION 2, PLEASE LEAVE A MESSAGE INCLUDING: YOUR NAME DATE OF BIRTH CALL BACK NUMBER REASON FOR CALL**this is important as we prioritize the call backs  YOU WILL RECEIVE A CALL BACK THE SAME DAY AS LONG AS YOU CALL BEFORE 4:00 PM

## 2024-03-13 NOTE — Progress Notes (Signed)
 ReDS Vest / Clip - 03/13/24 1400       ReDS Vest / Clip   Station Marker C    Ruler Value 26    ReDS Value Range Low volume    ReDS Actual Value 28

## 2024-03-15 ENCOUNTER — Ambulatory Visit (HOSPITAL_COMMUNITY): Payer: Self-pay | Admitting: Family Medicine

## 2024-03-19 ENCOUNTER — Ambulatory Visit (HOSPITAL_COMMUNITY): Payer: Self-pay | Admitting: Internal Medicine

## 2024-03-19 DIAGNOSIS — I5042 Chronic combined systolic (congestive) and diastolic (congestive) heart failure: Secondary | ICD-10-CM

## 2024-03-22 ENCOUNTER — Ambulatory Visit (HOSPITAL_COMMUNITY)
Admission: RE | Admit: 2024-03-22 | Discharge: 2024-03-22 | Disposition: A | Discharge status: Home / Self Care | Source: Ambulatory Visit | Attending: Cardiology | Admitting: Cardiology

## 2024-03-22 DIAGNOSIS — I5042 Chronic combined systolic (congestive) and diastolic (congestive) heart failure: Secondary | ICD-10-CM | POA: Insufficient documentation

## 2024-03-22 LAB — BASIC METABOLIC PANEL WITH GFR
Anion gap: 12 (ref 5–15)
BUN: 22 mg/dL (ref 8–23)
CO2: 30 mmol/L (ref 22–32)
Calcium: 9.7 mg/dL (ref 8.9–10.3)
Chloride: 92 mmol/L — ABNORMAL LOW (ref 98–111)
Creatinine, Ser: 1.37 mg/dL — ABNORMAL HIGH (ref 0.61–1.24)
GFR, Estimated: 54 mL/min — ABNORMAL LOW (ref >60)
Glucose, Bld: 150 mg/dL — ABNORMAL HIGH (ref 70–99)
Potassium: 4.1 mmol/L (ref 3.5–5.1)
Sodium: 134 mmol/L — ABNORMAL LOW (ref 135–145)

## 2024-04-06 ENCOUNTER — Encounter: Payer: Self-pay | Admitting: Hematology & Oncology

## 2024-04-06 ENCOUNTER — Other Ambulatory Visit (HOSPITAL_COMMUNITY): Payer: Self-pay

## 2024-04-06 ENCOUNTER — Other Ambulatory Visit: Payer: Self-pay

## 2024-04-06 ENCOUNTER — Telehealth: Payer: Self-pay | Admitting: Pharmacy Technician

## 2024-04-06 DIAGNOSIS — E782 Mixed hyperlipidemia: Secondary | ICD-10-CM

## 2024-04-06 DIAGNOSIS — I5040 Unspecified combined systolic (congestive) and diastolic (congestive) heart failure: Secondary | ICD-10-CM

## 2024-04-06 DIAGNOSIS — I2510 Atherosclerotic heart disease of native coronary artery without angina pectoris: Secondary | ICD-10-CM

## 2024-04-06 MED ORDER — REPATHA SURECLICK 140 MG/ML ~~LOC~~ SOAJ: 140.0000 mg | SUBCUTANEOUS | 3 refills | Status: AC

## 2024-04-06 NOTE — Telephone Encounter (Signed)
 Pharmacy Patient Advocate Encounter   Received notification from Fax that prior authorization for Repatha  is required/requested.   Insurance verification completed.   The patient is insured through [employer] .   Per test claim: The current 04/06/24 day co-pay is, $0.00- 3 months.  No PA needed at this time. This test claim was processed through [facility]- copay amounts may vary at other pharmacies due to pharmacy/plan contracts, or as the patient moves through the different stages of their insurance plan.    PA on file until 08/30/24

## 2024-05-04 ENCOUNTER — Inpatient Hospital Stay (HOSPITAL_BASED_OUTPATIENT_CLINIC_OR_DEPARTMENT_OTHER): Admitting: Hematology & Oncology

## 2024-05-04 ENCOUNTER — Inpatient Hospital Stay: Attending: Hematology & Oncology

## 2024-05-04 ENCOUNTER — Encounter: Payer: Self-pay | Admitting: Hematology & Oncology

## 2024-05-04 VITALS — BP 97/60 | HR 89 | Temp 97.9°F | Resp 20 | Ht 66.0 in | Wt 138.0 lb

## 2024-05-04 DIAGNOSIS — D500 Iron deficiency anemia secondary to blood loss (chronic): Secondary | ICD-10-CM

## 2024-05-04 DIAGNOSIS — Z79899 Other long term (current) drug therapy: Secondary | ICD-10-CM | POA: Insufficient documentation

## 2024-05-04 DIAGNOSIS — C61 Malignant neoplasm of prostate: Secondary | ICD-10-CM | POA: Diagnosis not present

## 2024-05-04 DIAGNOSIS — D509 Iron deficiency anemia, unspecified: Secondary | ICD-10-CM | POA: Diagnosis present

## 2024-05-04 DIAGNOSIS — C12 Malignant neoplasm of pyriform sinus: Secondary | ICD-10-CM | POA: Diagnosis not present

## 2024-05-04 LAB — CBC WITH DIFFERENTIAL (CANCER CENTER ONLY)
Abs Immature Granulocytes: 0.02 K/uL (ref 0.00–0.07)
Basophils Absolute: 0 K/uL (ref 0.0–0.1)
Basophils Relative: 0 %
Eosinophils Absolute: 0.1 K/uL (ref 0.0–0.5)
Eosinophils Relative: 1 %
HCT: 41.7 % (ref 39.0–52.0)
Hemoglobin: 12.8 g/dL — ABNORMAL LOW (ref 13.0–17.0)
Immature Granulocytes: 0 %
Lymphocytes Relative: 16 %
Lymphs Abs: 1.3 K/uL (ref 0.7–4.0)
MCH: 26.7 pg (ref 26.0–34.0)
MCHC: 30.7 g/dL (ref 30.0–36.0)
MCV: 87.1 fL (ref 80.0–100.0)
Monocytes Absolute: 1.1 K/uL — ABNORMAL HIGH (ref 0.1–1.0)
Monocytes Relative: 14 %
Neutro Abs: 5.4 K/uL (ref 1.7–7.7)
Neutrophils Relative %: 69 %
Platelet Count: 218 K/uL (ref 150–400)
RBC: 4.79 MIL/uL (ref 4.22–5.81)
RDW: 14.1 % (ref 11.5–15.5)
WBC Count: 7.8 K/uL (ref 4.0–10.5)
nRBC: 0 % (ref 0.0–0.2)

## 2024-05-04 LAB — CMP (CANCER CENTER ONLY)
ALT: 10 U/L (ref 0–44)
AST: 23 U/L (ref 15–41)
Albumin: 4.3 g/dL (ref 3.5–5.0)
Alkaline Phosphatase: 45 U/L (ref 38–126)
Anion gap: 11 (ref 5–15)
BUN: 25 mg/dL — ABNORMAL HIGH (ref 8–23)
CO2: 30 mmol/L (ref 22–32)
Calcium: 9.8 mg/dL (ref 8.9–10.3)
Chloride: 93 mmol/L — ABNORMAL LOW (ref 98–111)
Creatinine: 1.39 mg/dL — ABNORMAL HIGH (ref 0.61–1.24)
GFR, Estimated: 54 mL/min — ABNORMAL LOW (ref >60)
Glucose, Bld: 108 mg/dL — ABNORMAL HIGH (ref 70–99)
Potassium: 4.4 mmol/L (ref 3.5–5.1)
Sodium: 134 mmol/L — ABNORMAL LOW (ref 135–145)
Total Bilirubin: 0.3 mg/dL (ref 0.0–1.2)
Total Protein: 7.5 g/dL (ref 6.5–8.1)

## 2024-05-04 LAB — TSH: TSH: 2.65 u[IU]/mL (ref 0.350–4.500)

## 2024-05-04 LAB — IRON AND IRON BINDING CAPACITY (CC-WL,HP ONLY)
Iron: 38 ug/dL — ABNORMAL LOW (ref 45–182)
Saturation Ratios: 9 % — ABNORMAL LOW (ref 17.9–39.5)
TIBC: 416 ug/dL (ref 250–450)
UIBC: 378 ug/dL

## 2024-05-04 LAB — FERRITIN: Ferritin: 39 ng/mL (ref 24–336)

## 2024-05-04 NOTE — Progress Notes (Signed)
 Hematology and Oncology Follow Up Visit  Collin Young [id number] May 24, 1951 73 y.o. 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  Collin Young is back for followup.  We last saw him back in June.  He has been doing pretty well.  He really has had no complaints.  He has had no problem with his heart.  I think he see his cardiologist soon.  He had an echocardiogram that was done on 03/13/2024.  He had ejection fraction of 30-35%.  He continues on Eliquis .  He is doing well on the Eliquis .  There is no bleeding.  He has had no odynophagia or dysphagia.  He has had no change in bowel or bladder habits.  He has had no leg swelling.  When we last saw him, his ferritin was 314 with an iron  saturation of 26%.  We are also following his PSA.  His last PSA in June was 2.6.  Overall, I would have to say that his performance status is probably ECOG 2.   Wt Readings from Last 3 Encounters:  05/04/24 138 lb (62.6 kg)  03/13/24 130 lb (59 kg)  02/16/24 127 lb 3.2 oz (57.7 kg)     Medications:  Current Outpatient Medications:    albuterol  (VENTOLIN  HFA) 108 (90 Base) MCG/ACT inhaler, Inhale 2 puffs into the lungs every 6 (six) hours as needed for wheezing or shortness of breath., Disp: 18 g, Rfl: 5   apixaban  (ELIQUIS ) 5 MG TABS tablet, TAKE 1 TABLET(5 MG) BY MOUTH TWICE DAILY, Disp: 180 tablet, Rfl: 3   dapagliflozin  propanediol (FARXIGA ) 10 MG TABS tablet, Take 1 tablet (10 mg total) by mouth daily., Disp: 90 tablet, Rfl: 3   furosemide  (LASIX ) 40 MG tablet, Take 2 tablets (80 mg  total) by mouth daily. (Patient taking differently: Take 40 mg by mouth daily.), Disp: , Rfl:    polyethylene glycol (MIRALAX  / GLYCOLAX ) 17 g packet, Take 17 g by mouth daily as needed for mild constipation., Disp: 14 each, Rfl: 0   pravastatin  (PRAVACHOL ) 80 MG tablet, TAKE 1 TABLET(80 MG) BY MOUTH EVERY EVENING, Disp: 90 tablet, Rfl: 3   sacubitril -valsartan  (ENTRESTO ) 24-26 MG, Take 1 tablet by mouth 2 (two) times daily., Disp: 60 tablet, Rfl: 6   spironolactone  (ALDACTONE ) 25 MG tablet, Take 0.5 tablets (12.5 mg total) by mouth daily., Disp: 45 tablet, Rfl: 3   Evolocumab  (REPATHA  SURECLICK) 140 MG/ML SOAJ, Inject 140 mg into the skin every 14 (fourteen) days. Please keep your upcoming appointment for refills. (Patient not taking: Reported on 05/04/2024), Disp: 6 mL, Rfl: 3   ipratropium (ATROVENT ) 0.03 % nasal spray, Place 2 sprays into both nostrils every 12 (twelve) hours. (Patient not taking: Reported on 05/04/2024), Disp: 30 mL, Rfl: 12   potassium chloride  SA (KLOR-CON  M) 20 MEQ tablet, Take 1 tablet (20 mEq total) by mouth daily. (Patient not taking: Reported on 05/04/2024), Disp: 30 tablet, Rfl: 3 No current facility-administered medications for this visit.  Facility-Administered Medications Ordered in Other Visits:    topical emolient (BIAFINE) emulsion, , Topical, Daily, Dewey Rush, MD, Given  at 01/19/14 0912  Allergies: No Known Allergies  Past Medical History, Surgical history, Social history, and Family History were reviewed and updated.  Review of Systems: Review of Systems  Constitutional: Negative.   HENT: Negative.    Eyes: Negative.   Respiratory: Negative.    Cardiovascular: Negative.   Gastrointestinal: Negative.   Genitourinary: Negative.   Musculoskeletal: Negative.   Skin: Negative.   Neurological: Negative.   Endo/Heme/Allergies: Negative.   Psychiatric/Behavioral: Negative.     Physical Exam:  height is 5' 6 (1.676 m) and weight is 138 lb (62.6 kg). His oral  temperature is 97.9 F (36.6 C). His blood pressure is 97/60 and his pulse is 89. His respiration is 20 and oxygen saturation is 100%.   Physical Exam Vitals reviewed.  HENT:     Head: Normocephalic and atraumatic.  Eyes:     Pupils: Pupils are equal, round, and reactive to light.  Cardiovascular:     Rate and Rhythm: Normal rate and regular rhythm.     Heart sounds: Normal heart sounds.  Pulmonary:     Effort: Pulmonary effort is normal. No respiratory distress.     Breath sounds: No stridor. Rhonchi (throughout) present. No wheezing or rales.  Abdominal:     General: Bowel sounds are normal.     Palpations: Abdomen is soft.  Musculoskeletal:        General: No tenderness or deformity. Normal range of motion.     Cervical back: Normal range of motion.     Right lower leg: No edema.     Left lower leg: No edema.  Lymphadenopathy:     Cervical: No cervical adenopathy.  Skin:    General: Skin is warm and dry.     Findings: No erythema or rash.  Neurological:     Mental Status: He is alert and oriented to person, place, and time.  Psychiatric:        Behavior: Behavior normal.        Thought Content: Thought content normal.        Judgment: Judgment normal.     Lab Results  Component Value Date   WBC 7.8 05/04/2024   HGB 12.8 (L) 05/04/2024   HCT 41.7 05/04/2024   MCV 87.1 05/04/2024   PLT 218 05/04/2024     Chemistry      Component Value Date/Time   NA 134 (L) 05/04/2024 1435   NA 135 05/29/2020 1556   NA 139 07/30/2017 0926   NA 137 03/10/2017 1000   K 4.4 05/04/2024 1435   K 3.9 07/30/2017 0926   K 4.3 03/10/2017 1000   CL 93 (L) 05/04/2024 1435   CL 96 (L) 07/30/2017 0926   CO2 30 05/04/2024 1435   CO2 33 07/30/2017 0926   CO2 29 03/10/2017 1000   BUN 25 (H) 05/04/2024 1435   BUN 23 05/29/2020 1556   BUN 17 07/30/2017 0926   BUN 14.7 03/10/2017 1000   CREATININE 1.39 (H) 05/04/2024 1435   CREATININE 1.4 (H) 07/30/2017 0926   CREATININE 1.1 03/10/2017  1000      Component Value Date/Time   CALCIUM  9.8 05/04/2024 1435   CALCIUM  9.6 07/30/2017 0926   CALCIUM  10.0 03/10/2017 1000   ALKPHOS 45 05/04/2024 1435   ALKPHOS 49 07/30/2017 0926   ALKPHOS 51 03/10/2017 1000   AST 23 05/04/2024 1435   AST 23 03/10/2017 1000   ALT 10 05/04/2024 1435   ALT 31 07/30/2017 0926   ALT 18 03/10/2017  1000   BILITOT 0.3 05/04/2024 1435   BILITOT 0.41 03/10/2017 1000      Impression and Plan: Mr. Dismore is 73 year old gentleman. He had a localized recurrence of carcinoma of the left pyriform sinus. He underwent concurrent chemotherapy and radiation therapy. He finished his treatment back in April of 2015.   Clinically, he continues to do well.  I really do not see a problem with his head neck cancer or esophageal cancer coming back.  I know that he has had prostate cancer.  We are checking his PSA.  I will plan to get him back in 6 months now.  Everything, from my point of view, seems to be doing quite well.    Maude JONELLE Crease 9/4/20253:24 PM

## 2024-05-05 ENCOUNTER — Ambulatory Visit: Payer: Self-pay | Admitting: Hematology & Oncology

## 2024-05-05 ENCOUNTER — Other Ambulatory Visit: Payer: Self-pay | Admitting: Family

## 2024-05-05 LAB — PSA, TOTAL AND FREE
PSA, Free Pct: 5.1 %
PSA, Free: 0.19 ng/mL
Prostate Specific Ag, Serum: 3.7 ng/mL (ref 0.0–4.0)

## 2024-05-11 ENCOUNTER — Telehealth: Payer: Self-pay | Admitting: Hematology & Oncology

## 2024-05-11 NOTE — Telephone Encounter (Signed)
 LVM for patient to return call for scheduling 3 doses of IV iron .

## 2024-05-15 ENCOUNTER — Other Ambulatory Visit: Payer: Self-pay | Admitting: Family

## 2024-05-16 ENCOUNTER — Inpatient Hospital Stay

## 2024-05-16 VITALS — BP 132/47 | HR 92 | Resp 18

## 2024-05-16 DIAGNOSIS — D509 Iron deficiency anemia, unspecified: Secondary | ICD-10-CM | POA: Diagnosis not present

## 2024-05-16 DIAGNOSIS — D500 Iron deficiency anemia secondary to blood loss (chronic): Secondary | ICD-10-CM

## 2024-05-16 MED ORDER — SODIUM CHLORIDE 0.9 % IV SOLN: INTRAVENOUS | Status: DC

## 2024-05-16 MED ORDER — IRON SUCROSE 300 MG IVPB - SIMPLE MED: 300.0000 mg | Freq: Once | Status: DC

## 2024-05-16 MED ORDER — SODIUM CHLORIDE 0.9 % IV SOLN
300.0000 mg | Freq: Once | INTRAVENOUS | Status: AC
  Administered 2024-05-16: 300 mg via INTRAVENOUS
  Filled 2024-05-16: qty 300

## 2024-05-16 NOTE — Patient Instructions (Signed)

## 2024-05-23 ENCOUNTER — Inpatient Hospital Stay

## 2024-05-24 ENCOUNTER — Other Ambulatory Visit: Payer: Self-pay | Admitting: Internal Medicine

## 2024-05-30 ENCOUNTER — Inpatient Hospital Stay

## 2024-05-30 VITALS — BP 114/58 | HR 79 | Temp 98.2°F | Resp 18

## 2024-05-30 DIAGNOSIS — D500 Iron deficiency anemia secondary to blood loss (chronic): Secondary | ICD-10-CM

## 2024-05-30 DIAGNOSIS — D509 Iron deficiency anemia, unspecified: Secondary | ICD-10-CM | POA: Diagnosis not present

## 2024-05-30 MED ORDER — IRON SUCROSE 300 MG IVPB - SIMPLE MED
300.0000 mg | Freq: Once | Status: AC
  Administered 2024-05-30: 300 mg via INTRAVENOUS
  Filled 2024-05-30: qty 300

## 2024-05-30 MED ORDER — SODIUM CHLORIDE 0.9 % IV SOLN: INTRAVENOUS | Status: DC

## 2024-05-30 NOTE — Patient Instructions (Signed)

## 2024-05-30 NOTE — Progress Notes (Signed)
 Pt refused to stay for 30 minutes post iron  infusion and is discharged to home with no complaints.

## 2024-06-06 ENCOUNTER — Inpatient Hospital Stay: Attending: Hematology & Oncology

## 2024-07-29 NOTE — Progress Notes (Deleted)
 [facility] at [facility] [address] [place], [place] [address] [phone]  Date:  07/31/2024   Name:  Collin Young   DOB:  [birth year]   MRN:  [id number]  PCP:  Watt Harlene BROCKS, MD    Chief Complaint: No chief complaint on file.   History of Present Illness:  Collin Young is a 73 y.o. very pleasant male patient who presents with the following:  Patient seen today with concern of urinary symptoms.  I saw him most recently in March Past spring he was dealing with CHF exacerbations and was admitted to the hospital for 4 days Since then he has stayed out of the hospital He saw his hematologist, Dr. Timmy in [place] changes far as his left piriform sinus carcinoma follows prostate cancer.  They plan to see him back in 6 months He was seen in advanced heart failure clinic in July-it looks like his Lasix  was decreased from 80 twice a day to 80 daily at that time  -history of chronic combined systolic and diastolic CHF/ ischemic CM, severe multivessel CAD treated medically, 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     Needs tetanus booster Flu shot Recommend COVID booster Shingrix  Discussed the use of AI scribe software for clinical note transcription with the patient, who gave verbal consent to proceed.  History of Present Illness     Patient Active Problem List   Diagnosis Date Noted   Acute on chronic congestive heart failure (HCC) 10/25/2023   Iron  deficiency anemia due to chronic blood loss 11/14/2021   Acute on chronic systolic CHF (congestive heart failure) (HCC) 07/07/2021   COPD (chronic obstructive pulmonary disease) (HCC)    Hyponatremia    SOB (shortness of breath)    DNR (do not resuscitate)    Weakness generalized    Elevated LFTs    Pulmonary embolism (HCC) 06/11/2021   GERD without esophagitis 05/08/2021    Mixed hyperlipidemia 05/08/2021   Hypertensive urgency 05/08/2021   Elevated troponin level not due myocardial infarction 05/08/2021   Pure hypercholesterolemia 09/23/2020   Claudication in peripheral vascular disease 01/25/2020   Late latent syphilis 01/15/2020   Coronary artery disease involving native coronary artery of native heart 07/05/2019   Chronic kidney disease, stage 3a (HCC) 07/05/2019   Combined systolic and diastolic heart failure (HCC) 09/26/2018   Malignant neoplasm of prostate (HCC) 06/17/2018   Medication management    Dysphagia    Palliative care by specialist    Ischemic cardiomyopathy    Congestive dilated cardiomyopathy (HCC)    Acute on chronic systolic congestive heart failure (HCC) 01/02/2017   Malignant neoplasm of pyriform sinus (HCC) 09/28/2013   Piriform sinus tumor 09/24/2013   NONSPECIFIC ABN FINDING RAD & OTH EXAM GI TRACT 05/14/2009    Past Medical History:  Diagnosis Date   Chronic combined systolic and diastolic CHF (congestive heart failure) (HCC)    followed by dr t. raford   COPD (chronic obstructive pulmonary disease) (HCC)    Coronary artery disease cardiologist-- dr annabella raford   per cardiac cath 01-05-2017  chronic total occlusion pLCx with collaterals, 99% severe calcified prox. to mid RCA, otherwise mild to moderate CAD (medically managed)   Esophageal cancer, stage IIIB [facility]) oncologist-  dr ennever/  dr moody   dx 2010  SCC Stage IIIB completed chemoradiation;  localized recurrent left  piriform sinus 01/ 2015,  completed concurrent chemoradiatoin 04/ 2015   GERD (gastroesophageal reflux disease)    09-07-2018   no issues since Gtube removed 06/ 2019   History of alcohol  abuse    quit 2001   History of cancer chemotherapy    2010;   2015   History of radiation therapy    10-19-2013 to  12-05-2013 pyriform sinus 69.96 Gy/40fx;   Radiation completed 2010 for esophageal cancer   History of seizure 2001   alcohol  withdrawal   HOH  (hard of hearing)    Hyperlipidemia    Hypertension    Iron  deficiency anemia due to chronic blood loss 11/14/2021   Ischemic cardiomyopathy 12/2016   01-03-2017  echo,  ef 10-15%/   echo 05-06-2017 EF improved to 45-50%   Prostate cancer [facility]) urologist-  dr ottelin/  oncologist-- dr patrcia   dx 05-27-2018--- Stage T1c,  Gleason 4+3,  PSA 9.3--  scheduled for brachytherapy 09-23-2017   Renal cyst, left     Past Surgical History:  Procedure Laterality Date   BIOPSY  12/18/2021   Procedure: BIOPSY;  Surgeon: Teressa Toribio SQUIBB, MD;  Location: [facility];  Service: Gastroenterology;;   CARDIOVASCULAR STRESS TEST  10/09/09   normal nuclearr stress test, EF 57% Rendell First)   CENTRAL LINE INSERTION  06/16/2021   Procedure: CENTRAL LINE INSERTION;  Surgeon: Cherrie Toribio SAUNDERS, MD;  [facility];  Service: Cardiovascular;;   ESOPHAGOGASTRODUODENOSCOPY (EGD) WITH PROPOFOL  N/A 12/18/2021   Procedure: ESOPHAGOGASTRODUODENOSCOPY (EGD) WITH PROPOFOL ;  Surgeon: Teressa Toribio SQUIBB, MD;  Location: [facility];  Service: Gastroenterology;  Laterality: N/A;   IR GASTROSTOMY TUBE MOD SED  01/13/2017   IR GASTROSTOMY TUBE REMOVAL  02/03/2018   IR PATIENT EVAL TECH 0-60 MINS  03/25/2017   IR REMOVAL TUN ACCESS W/ PORT W/O FL MOD SED  01/15/2017   IR REPLACE G-TUBE SIMPLE WO FLUORO  01/13/2018   LARYNGOSCOPY N/A 09/15/2013   Procedure: LARYNGOSCOPY;  Surgeon: Vaughan Ricker, MD;  Location: [facility] OR;  Service: ENT;  Laterality: N/A;  direct laryngoscopy with biopsy and esophagoscopy   RADIOACTIVE SEED IMPLANT N/A 09/23/2018   Procedure: RADIOACTIVE SEED IMPLANT/BRACHYTHERAPY IMPLANT;  Surgeon: Ottelin, Mark, MD;  Location: [facility];  Service: Urology;  Laterality: N/A;   RIGHT HEART CATH N/A 06/16/2021   Procedure: RIGHT HEART CATH;  Surgeon: Cherrie Toribio SAUNDERS, MD;  [facility];  Service: Cardiovascular;  Laterality: N/A;   RIGHT/LEFT HEART CATH AND CORONARY ANGIOGRAPHY  N/A 01/05/2017   Procedure: Right/Left Heart Cath and Coronary Angiography;  Surgeon: [name] Lonni BIRCH, MD;  Location: [facility] INVASIVE CV LAB;  Service: Cardiovascular;  Laterality: N/A;   TRANSTHORACIC ECHOCARDIOGRAM  05/06/2017   ef 45-50%,  grade 1 diastolic dysfunction/  AV severe calcified non coronary cusp with moderate regurg. , no stenosis (valve area per echo 01-05-2017 1.08cm^2)/  mild MR, TR, and PR/  mild LAE    Social History   Tobacco Use   Smoking status: Former    Current packs/day: 0.00    Average packs/day: 1 pack/day for 48.8 years (48.8 ttl pk-yrs)    Types: Cigarettes    Start date: 11/08/1960    Quit date: 08/31/2009    Years since quitting: 14.9   Smokeless tobacco: Former    Types: Chew    Quit date: 09/01/1999  Vaping Use   Vaping status: Never Used  Substance Use Topics   Alcohol  use: Not Currently  Alcohol /week: 0.0 standard drinks of alcohol     Comment: quit in 2001   Drug use: No    Comment: back in the day used cocaine,alcohol , marijuana    Family History  Problem Relation Age of Onset   Breast cancer Mother    Prostate cancer Neg Hx    Colon cancer Neg Hx    Pancreatic cancer Neg Hx     No Known Allergies  Medication list has been reviewed and updated.  Current Outpatient Medications on File Prior to Visit  Medication Sig Dispense Refill   albuterol  (VENTOLIN  HFA) 108 (90 Base) MCG/ACT inhaler Inhale 2 puffs into the lungs every 6 (six) hours as needed for wheezing or shortness of breath. 18 g 5   apixaban  (ELIQUIS ) 5 MG TABS tablet TAKE 1 TABLET(5 MG) BY MOUTH TWICE DAILY 180 tablet 3   dapagliflozin  propanediol (FARXIGA ) 10 MG TABS tablet TAKE 1 TABLET(10 MG) BY MOUTH DAILY 90 tablet 3   Evolocumab  (REPATHA  SURECLICK) 140 MG/ML SOAJ Inject 140 mg into the skin every 14 (fourteen) days. Please keep your upcoming appointment for refills. (Patient not taking: Reported on 05/04/2024) 6 mL 3   furosemide  (LASIX ) 40 MG tablet Take 2 tablets (80 mg  total) by mouth daily. (Patient taking differently: Take 40 mg by mouth daily.)     ipratropium (ATROVENT ) 0.03 % nasal spray Place 2 sprays into both nostrils every 12 (twelve) hours. (Patient not taking: Reported on 05/04/2024) 30 mL 12   polyethylene glycol (MIRALAX  / GLYCOLAX ) 17 g packet Take 17 g by mouth daily as needed for mild constipation. 14 each 0   potassium chloride  SA (KLOR-CON  M) 20 MEQ tablet Take 1 tablet (20 mEq total) by mouth daily. (Patient not taking: Reported on 05/04/2024) 30 tablet 3   pravastatin  (PRAVACHOL ) 80 MG tablet TAKE 1 TABLET(80 MG) BY MOUTH EVERY EVENING 90 tablet 3   sacubitril -valsartan  (ENTRESTO ) 24-26 MG Take 1 tablet by mouth 2 (two) times daily. 60 tablet 6   spironolactone  (ALDACTONE ) 25 MG tablet Take 0.5 tablets (12.5 mg total) by mouth daily. 45 tablet 3   Current Facility-Administered Medications on File Prior to Visit  Medication Dose Route Frequency Provider Last Rate Last Admin   topical emolient (BIAFINE) emulsion   Topical Daily Dewey Rush, MD   Given at 01/19/14 9087    Review of Systems:  As per HPI- otherwise negative.   Physical Examination: There were no vitals filed for this visit. There were no vitals filed for this visit. There is no height or weight on file to calculate BMI. Ideal Body Weight:    GEN: no acute distress. HEENT: Atraumatic, Normocephalic.  Ears and Nose: No external deformity. CV: RRR, No M/G/R. No JVD. No thrill. No extra heart sounds. PULM: CTA B, no wheezes, crackles, rhonchi. No retractions. No resp. distress. No accessory muscle use. ABD: S, NT, ND, +BS. No rebound. No HSM. EXTR: No c/c/e PSYCH: Normally interactive. Conversant.    Assessment and Plan: No diagnosis found.  Assessment & Plan   Signed Harlene Schroeder, MD

## 2024-07-29 NOTE — Patient Instructions (Incomplete)
 Please be sure to get a COVID booster this fall if not done already.  I also recommend getting the shingles vaccine series

## 2024-07-31 ENCOUNTER — Ambulatory Visit: Admitting: Family Medicine

## 2024-07-31 ENCOUNTER — Ambulatory Visit: Payer: 59 | Admitting: Family Medicine

## 2024-08-04 ENCOUNTER — Other Ambulatory Visit: Payer: Self-pay | Admitting: Family Medicine

## 2024-08-04 DIAGNOSIS — E7800 Pure hypercholesterolemia, unspecified: Secondary | ICD-10-CM

## 2024-08-07 ENCOUNTER — Telehealth: Payer: Self-pay | Admitting: *Deleted

## 2024-08-07 ENCOUNTER — Encounter

## 2024-08-07 NOTE — Telephone Encounter (Signed)
 Pt scheduled for AWV at 11. No answer on cell or home #. Left message to call and r/s.

## 2024-08-09 ENCOUNTER — Ambulatory Visit (HOSPITAL_COMMUNITY): Admission: RE | Admit: 2024-08-09 | Discharge: 2024-08-09 | Attending: Surgery | Admitting: Surgery

## 2024-08-09 ENCOUNTER — Ambulatory Visit

## 2024-08-09 VITALS — BP 117/72 | HR 89 | Temp 98.3°F | Wt 144.2 lb

## 2024-08-09 DIAGNOSIS — I6522 Occlusion and stenosis of left carotid artery: Secondary | ICD-10-CM | POA: Diagnosis not present

## 2024-08-09 DIAGNOSIS — I70213 Atherosclerosis of native arteries of extremities with intermittent claudication, bilateral legs: Secondary | ICD-10-CM

## 2024-08-09 LAB — VAS US ABI WITH/WO TBI
Left ABI: 0.83
Right ABI: 0.59

## 2024-08-09 NOTE — Progress Notes (Signed)
 Office Note     CC:  follow up Requesting Provider:  Watt Harlene BROCKS, MD  HPI: Collin Young is a 73 y.o. (Jul 03, 1951) male who presents for surveillance follow up of carotid artery disease and PAD. He has history of bilateral lower extremity claudication. He has no history of TIA or stroke. We have been following his asymptomatic left ICA 60-79% stenosis.   He is very hard of hearing despite wearing his hearing aids. This made his visit today a little challenging. He does have bilateral blurred vision but he has known cataracts. He says his daughter tried to get him into see Opthalmologist but he wasn't able to get appointment until next year. No amaurosis or other visual changes.  He denies any slurred speech, facial drooping, unilateral upper or lower extremity weakness or numbness. He has history of radiation to his neck in the past for cancer and he is a former smoker, quit in 2011. Secondary to his neck radiation he has a chronic hoarse voice.   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 He says he is working less now. He does report some pain in his legs but its after quite a long time on his legs. The pain is along his shins and sometimes in his hips. He says this is improved with rest. This does not stop him from doing his ADLs.  He denies any pain at rest or tissue loss. Medically he is managed on Eliquis  and statin and Repatha    Past Medical History:  Diagnosis Date   Chronic combined systolic and diastolic CHF (congestive heart failure) (HCC)    followed by dr t. raford   COPD (chronic obstructive pulmonary disease) (HCC)    Coronary artery disease cardiologist-- dr annabella raford   per cardiac cath 01-05-2017  chronic total occlusion pLCx with collaterals, 99% severe calcified prox. to mid RCA, otherwise mild to moderate CAD (medically managed)   Esophageal cancer, stage IIIB [facility]) oncologist-  dr ennever/  dr moody   dx 2010  SCC Stage IIIB  completed chemoradiation;  localized recurrent left piriform sinus 01/ 2015,  completed concurrent chemoradiatoin 04/ 2015   GERD (gastroesophageal reflux disease)    09-07-2018   no issues since Gtube removed 06/ 2019   History of alcohol  abuse    quit 2001   History of cancer chemotherapy    2010;   2015   History of radiation therapy    10-19-2013 to  12-05-2013 pyriform sinus 69.96 Gy/68fx;   Radiation completed 2010 for esophageal cancer   History of seizure 2001   alcohol  withdrawal   HOH (hard of hearing)    Hyperlipidemia    Hypertension    Iron  deficiency anemia due to chronic blood loss 11/14/2021   Ischemic cardiomyopathy 12/2016   01-03-2017  echo,  ef 10-15%/   echo 05-06-2017 EF improved to 45-50%   Prostate cancer [facility]) urologist-  dr ottelin/  oncologist-- dr patrcia   dx 05-27-2018--- Stage T1c,  Gleason 4+3,  PSA 9.3--  scheduled for brachytherapy 09-23-2017   Renal cyst, left     Past Surgical History:  Procedure Laterality Date   BIOPSY  12/18/2021   Procedure: BIOPSY;  Surgeon: Teressa Toribio SQUIBB, MD;  Location: [facility];  Service: Gastroenterology;;   CARDIOVASCULAR STRESS TEST  10/09/09   normal nuclearr stress test, EF 57% Rendell First)   CENTRAL LINE INSERTION  06/16/2021   Procedure: CENTRAL LINE INSERTION;  Surgeon: Cherrie Toribio SAUNDERS,  MD;  [facility];  Service: Cardiovascular;;   ESOPHAGOGASTRODUODENOSCOPY (EGD) WITH PROPOFOL  N/A 12/18/2021   Procedure: ESOPHAGOGASTRODUODENOSCOPY (EGD) WITH PROPOFOL ;  Surgeon: Teressa Toribio SQUIBB, MD;  [facility];  Service: Gastroenterology;  Laterality: N/A;   IR GASTROSTOMY TUBE MOD SED  01/13/2017   IR GASTROSTOMY TUBE REMOVAL  02/03/2018   IR PATIENT EVAL TECH 0-60 MINS  03/25/2017   IR REMOVAL TUN ACCESS W/ PORT W/O FL MOD SED  01/15/2017   IR REPLACE G-TUBE SIMPLE WO FLUORO  01/13/2018   LARYNGOSCOPY N/A 09/15/2013   Procedure: LARYNGOSCOPY;  Surgeon: Vaughan Ricker, MD;  Location: [facility] OR;  Service:  ENT;  Laterality: N/A;  direct laryngoscopy with biopsy and esophagoscopy   RADIOACTIVE SEED IMPLANT N/A 09/23/2018   Procedure: RADIOACTIVE SEED IMPLANT/BRACHYTHERAPY IMPLANT;  Surgeon: Ottelin, Mark, MD;  Location: [facility];  Service: Urology;  Laterality: N/A;   RIGHT HEART CATH N/A 06/16/2021   Procedure: RIGHT HEART CATH;  Surgeon: Cherrie Toribio SAUNDERS, MD;  [facility];  Service: Cardiovascular;  Laterality: N/A;   RIGHT/LEFT HEART CATH AND CORONARY ANGIOGRAPHY N/A 01/05/2017   Procedure: Right/Left Heart Cath and Coronary Angiography;  Surgeon: [name] Lonni BIRCH, MD;  Location: [facility] INVASIVE CV LAB;  Service: Cardiovascular;  Laterality: N/A;   TRANSTHORACIC ECHOCARDIOGRAM  05/06/2017   ef 45-50%,  grade 1 diastolic dysfunction/  AV severe calcified non coronary cusp with moderate regurg. , no stenosis (valve area per echo 01-05-2017 1.08cm^2)/  mild MR, TR, and PR/  mild LAE    Social History   Socioeconomic History   Marital status: Legally Separated    Spouse name: Not on file   Number of children: 2   Years of education: Not on file   Highest education level: Not on file  Occupational History   Occupation: retired  Tobacco Use   Smoking status: Former    Current packs/day: 0.00    Average packs/day: 1 pack/day for 48.8 years (48.8 ttl pk-yrs)    Types: Cigarettes    Start date: 11/08/1960    Quit date: 08/31/2009    Years since quitting: 14.9   Smokeless tobacco: Former    Types: Chew    Quit date: 09/01/1999  Vaping Use   Vaping status: Never Used  Substance and Sexual Activity   Alcohol  use: Not Currently    Alcohol /week: 0.0 standard drinks of alcohol     Comment: quit in 2001   Drug use: No    Comment: back in the day used cocaine,alcohol , marijuana   Sexual activity: Not Currently    Partners: Female    Birth control/protection: None  Other Topics Concern   Not on file  Social History Narrative   Not on file   Social Drivers  of Health   Financial Resource Strain: Low Risk  (07/20/2023)   Overall Financial Resource Strain (CARDIA)    Difficulty of Paying Living Expenses: Not hard at all  Food Insecurity: No Food Insecurity (11/01/2023)   Hunger Vital Sign    Worried About Running Out of Food in the Last Year: Never true    Ran Out of Food in the Last Year: Never true  Transportation Needs: No Transportation Needs (11/01/2023)   PRAPARE - Administrator, Civil Service (Medical): No    Lack of Transportation (Non-Medical): No  Physical Activity: Inactive (07/20/2023)   Exercise Vital Sign    Days of Exercise per Week: 0 days    Minutes of  Exercise per Session: 0 min  Stress: No Stress Concern Present (07/20/2023)   [employer] of Occupational Health - Occupational Stress Questionnaire    Feeling of Stress : Not at all  Social Connections: Moderately Integrated (10/26/2023)   Social Connection and Isolation Panel    Frequency of Communication with Friends and Family: More than three times a week    Frequency of Social Gatherings with Friends and Family: More than three times a week    Attends Religious Services: More than 4 times per year    Active Member of [employer] or Organizations: Yes    Attends Engineer, Structural: More than 4 times per year    Marital Status: Divorced  Intimate Partner Violence: Not At Risk (11/01/2023)   Humiliation, Afraid, Rape, and Kick questionnaire    Fear of Current or Ex-Partner: No    Emotionally Abused: No    Physically Abused: No    Sexually Abused: No    Family History  Problem Relation Age of Onset   Breast cancer Mother    Prostate cancer Neg Hx    Colon cancer Neg Hx    Pancreatic cancer Neg Hx     Current Outpatient Medications  Medication Sig Dispense Refill   albuterol  (VENTOLIN  HFA) 108 (90 Base) MCG/ACT inhaler Inhale 2 puffs into the lungs every 6 (six) hours as needed for wheezing or shortness of breath. 18 g 5   apixaban  (ELIQUIS ) 5  MG TABS tablet TAKE 1 TABLET(5 MG) BY MOUTH TWICE DAILY 180 tablet 3   dapagliflozin  propanediol (FARXIGA ) 10 MG TABS tablet TAKE 1 TABLET(10 MG) BY MOUTH DAILY 90 tablet 3   Evolocumab  (REPATHA  SURECLICK) 140 MG/ML SOAJ Inject 140 mg into the skin every 14 (fourteen) days. Please keep your upcoming appointment for refills. (Patient not taking: Reported on 05/04/2024) 6 mL 3   furosemide  (LASIX ) 40 MG tablet Take 2 tablets (80 mg total) by mouth daily. (Patient taking differently: Take 40 mg by mouth daily.)     ipratropium (ATROVENT ) 0.03 % nasal spray Place 2 sprays into both nostrils every 12 (twelve) hours. (Patient not taking: Reported on 05/04/2024) 30 mL 12   polyethylene glycol (MIRALAX  / GLYCOLAX ) 17 g packet Take 17 g by mouth daily as needed for mild constipation. 14 each 0   potassium chloride  SA (KLOR-CON  M) 20 MEQ tablet Take 1 tablet (20 mEq total) by mouth daily. (Patient not taking: Reported on 05/04/2024) 30 tablet 3   pravastatin  (PRAVACHOL ) 80 MG tablet Take 1 tablet (80 mg total) by mouth every evening. Needs appt 30 tablet 0   sacubitril -valsartan  (ENTRESTO ) 24-26 MG Take 1 tablet by mouth 2 (two) times daily. 60 tablet 6   spironolactone  (ALDACTONE ) 25 MG tablet Take 0.5 tablets (12.5 mg total) by mouth daily. 45 tablet 3   No current facility-administered medications for this visit.   Facility-Administered Medications Ordered in Other Visits  Medication Dose Route Frequency Provider Last Rate Last Admin   topical emolient (BIAFINE) emulsion   Topical Daily Dewey Rush, MD   Given at 01/19/14 [phone]    No Known Allergies   REVIEW OF SYSTEMS:  [X]  denotes positive finding, [ ]  denotes negative finding Cardiac  Comments:  Chest pain or chest pressure:    Shortness of breath upon exertion:    Short of breath when lying flat:    Irregular heart rhythm:        Vascular    Pain in calf, thigh, or hip brought on  by ambulation:    Pain in feet at night that wakes you up from  your sleep:     Blood clot in your veins:    Leg swelling:         Pulmonary    Oxygen at home:    Productive cough:     Wheezing:         Neurologic    Sudden weakness in arms or legs:     Sudden numbness in arms or legs:     Sudden onset of difficulty speaking or slurred speech:    Temporary loss of vision in one eye:     Problems with dizziness:         Gastrointestinal    Blood in stool:     Vomited blood:         Genitourinary    Burning when urinating:     Blood in urine:        Psychiatric    Major depression:         Hematologic    Bleeding problems:    Problems with blood clotting too easily:        Skin    Rashes or ulcers:        Constitutional    Fever or chills:      PHYSICAL EXAMINATION:  Vitals:   08/09/24 0943 08/09/24 0944  BP: 136/77 117/72  Pulse: 89 89  Temp: 98.3 F (36.8 C)   TempSrc: Temporal   Weight: 144 lb 3.2 oz (65.4 kg)     General:  WDWN in NAD; vital signs documented above Gait: Normal HENT: WNL, normocephalic Pulmonary: normal non-labored breathing Cardiac: regular HR, without  Murmurs without carotid bruit Abdomen: soft, NT Vascular Exam/Pulses: 2+ radial pulses, 1+ Femoral pulses, Doppler monophasic peroneal signals bilaterally. Feet warm and well perfused  Extremities: without ischemic changes, without Gangrene , without cellulitis; without open wounds;  Musculoskeletal: no muscle wasting or atrophy  Neurologic: A&O X 3 Psychiatric:  The pt has Normal affect.   Non-Invasive Vascular Imaging:   VAS US  Carotid duplex: Summary:  Right Carotid: Velocities in the right ICA are consistent with a 1-39% stenosis. Non-hemodynamically significant plaque <50% noted in the CCA.   Left Carotid: Velocities in the left ICA are consistent with a 60-79% stenosis. Non-hemodynamically significant plaque <50% noted in the CCA. The ECA appears >50% stenosed.   Vertebrals:  Bilateral vertebral arteries demonstrate antegrade flow.   Subclavians: Normal flow hemodynamics were seen in bilateral subclavian arteries.    +-------+-----------+-----------+------------+------------+  ABI/TBIToday's ABIToday's TBIPrevious ABIPrevious TBI  +-------+-----------+-----------+------------+------------+  Right 0.59       0          0.6         0             +-------+-----------+-----------+------------+------------+  Left  0.83       0.18       0.68        0             +-------+-----------+-----------+------------+------------+   ASSESSMENT/PLAN:: 73 y.o. male here for surveillance follow up of carotid artery disease and PAD. He has history of bilateral lower extremity claudication. He has no history of TIA or stroke. We have been following his asymptomatic left ICA 60-79% stenosis. He continues to have bilateral lower extremity claudication but this is not lifestyle limiting. He does not have any TIA or stroke like symptoms.  - Duplex today shows stable right ICA stenosis  of 1-39%, left ICA stenosis of 60-79%, normal flow in the vertebral and subclavian arteries bilaterally.  - ABI stable on right, slight improvement on Left compared to prior - encourage him to walk as much as possible - continue Eliquis , Statin, Repatha  - He knows to call for earlier follow up if any new or worsening symptoms - reviewed signs and symptoms of TIA/ Stroke and he understands should this occur to seek immediate medical attention - He will follow up again in 6 months with repeat ABI and carotid duplex   Teretha Damme, PA-C [facility] [phone]  Clinic MD:   Sheree

## 2024-08-10 ENCOUNTER — Other Ambulatory Visit: Payer: Self-pay | Admitting: *Deleted

## 2024-08-10 DIAGNOSIS — I70213 Atherosclerosis of native arteries of extremities with intermittent claudication, bilateral legs: Secondary | ICD-10-CM

## 2024-08-10 DIAGNOSIS — I6522 Occlusion and stenosis of left carotid artery: Secondary | ICD-10-CM

## 2024-08-14 ENCOUNTER — Telehealth: Payer: Self-pay | Admitting: Family Medicine

## 2024-08-14 NOTE — Telephone Encounter (Signed)
 Copied from CRM #[id number]. Topic: Medicare AWV >> Aug 14, 2024 11:33 AM Nathanel DEL wrote: Called LVM 08/14/2024 to sched AWV. Please schedule AWV in office only.   Nathanel Paschal; Care Guide Ambulatory Clinical Support [place] [facility] Direct Dial: [phone]

## 2024-09-03 NOTE — Patient Instructions (Incomplete)
 It was good to see you today, please let me know if you are not feeling better soon. I do recommend a COVID booster if not in the last year or so as well as the shingles vaccine series if not done yet  I will be in touch with your labs asap We will sent your urine for a culture to determine if you have a urinary tract infection  If the black stools or black vomit comes back please go to the ER or call me right away!

## 2024-09-03 NOTE — Progress Notes (Unsigned)
 [facility] [employer] [address], [address] [place], [place] [address] [phone]  Date:  09/04/2024   Name:  Collin Young   DOB:  1951-08-18   MRN:  [id number]  PCP:  Watt Harlene BROCKS, MD    Chief Complaint: Urinary Frequency   History of Present Illness:  Collin Young is a 74 y.o. very pleasant male patient who presents with the following:  Patient seen today with concern of urinary changes.  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 Chronic Biventricular HF - EF remained 20-30% from 2021 to 2023, when it improved to 40-45%. - Echo (9/21): EF down again, 35-40%. RV normal  - Echo 2/25: EF down 20-25% RV moderately reduced + WMA. - Echo today 03/13/24 EF 40-45% RV ok  - Stable NYHA II volume looks dry (ReDS 28%) and he appears orthostatic - Drop lasix  80 bid -> 80 daily - Continue spiro 12.5 mg daily. - Continue Entresto  24/26 mg bid. - Continue Farxiga  10 mg daily. - No b-blocker yet with recovered EF and low BP/orthostasis  - Labs today. 2. HTN  - BP low - see meds above  3. CAD - Cath 2018 severe Prox -Mid CTO proximal circumflex. Mod  left main and mid LAD.  - No s/s angina - Continue pravastatin . - No ASA with Eliquis .   4. CKD Stage IIIa - Baseline SCr 1.2-1.3. - Continue Farxiga  - Labs today  Flu vaccine Recommend COVID booster Recommend Shingrix  Discussed the use of AI scribe software for clinical note transcription with the patient, who gave verbal consent to proceed.  History of Present Illness Collin Young is a 74 year old male with a history of prostate cancer who presents with urinary symptoms.  He has been experiencing urinary symptoms characterized by a slow and intermittent urinary stream. He has increased his water intake in response to these symptoms. There is slight discomfort and a tingling sensation during urination, but no significant pain. He notes increased frequency of urination, particularly at night, needing to urinate three to four times, which is more than usual. No hematuria is reported.  Approximately two weeks ago, he experienced an episode of vomiting with the vomitus described as 'jet black.' Concurrently, his bowel movements were also black. He took Pepto Bismol, which he believes helped, and reports that his bowel movements have since returned to normal. The black stools lasted about a week.  He has a history of prostate cancer and believes his prostate was removed, although he is uncertain. He has not seen his urologist since last year and typically sees him once a year. He has not received a flu  shot this year, citing previous adverse reactions including soreness and generalized aching pain, which he describes as being 'torn all the pieces.' I believe he is a patient at [facility] urology and I will request previous notes  His previous gastroenterologist was Dr. Teressa who is now retired and I do not believe he is established with a new gastroenterologist as of yet     Patient Active Problem List   Diagnosis Date Noted   Acute on chronic congestive heart failure (HCC) 10/25/2023    Iron  deficiency anemia due to chronic blood loss 11/14/2021   Acute on chronic systolic CHF (congestive heart failure) (HCC) 07/07/2021   COPD (chronic obstructive pulmonary disease) (HCC)    Hyponatremia    SOB (shortness of breath)    DNR (do not resuscitate)    Weakness generalized    Elevated LFTs    Pulmonary embolism (HCC) 06/11/2021   GERD without esophagitis 05/08/2021   Mixed hyperlipidemia 05/08/2021   Hypertensive urgency 05/08/2021   Elevated troponin level not due myocardial infarction 05/08/2021   Pure hypercholesterolemia 09/23/2020   Claudication in peripheral vascular disease 01/25/2020   Late latent syphilis 01/15/2020   Coronary artery disease involving native coronary artery of native heart 07/05/2019   Chronic kidney disease, stage 3a (HCC) 07/05/2019   Combined systolic and diastolic heart failure (HCC) 09/26/2018   Malignant neoplasm of prostate (HCC) 06/17/2018   Medication management    Dysphagia    Palliative care by specialist    Ischemic cardiomyopathy    Congestive dilated cardiomyopathy (HCC)    Acute on chronic systolic congestive heart failure (HCC) 01/02/2017   Malignant neoplasm of pyriform sinus (HCC) 09/28/2013   Piriform sinus tumor 09/24/2013   NONSPECIFIC ABN FINDING RAD & OTH EXAM GI TRACT 05/14/2009    Past Medical History:  Diagnosis Date   Chronic combined systolic and diastolic CHF (congestive heart failure) (HCC)    followed by dr t. raford   COPD (chronic obstructive pulmonary disease) (HCC)    Coronary artery disease cardiologist-- dr annabella raford   per cardiac cath 01-05-2017  chronic total occlusion pLCx with collaterals, 99% severe calcified prox. to mid RCA, otherwise mild to moderate CAD (medically managed)   Esophageal cancer, stage IIIB [facility]) oncologist-  dr ennever/  dr moody   dx 2010  SCC Stage IIIB completed chemoradiation;  localized recurrent left piriform sinus 01/ 2015,  completed concurrent chemoradiatoin  04/ 2015   GERD (gastroesophageal reflux disease)    09-07-2018   no issues since Gtube removed 06/ 2019   History of alcohol  abuse    quit 2001   History of cancer chemotherapy    2010;   2015   History of radiation therapy    10-19-2013 to  12-05-2013 pyriform sinus 69.96 Gy/64fx;   Radiation completed 2010 for esophageal cancer   History of seizure 2001   alcohol  withdrawal   HOH (hard of hearing)    Hyperlipidemia    Hypertension    Iron  deficiency anemia due to chronic blood loss 11/14/2021   Ischemic cardiomyopathy 12/2016   01-03-2017  echo,  ef 10-15%/   echo 05-06-2017 EF improved to 45-50%   Prostate cancer [facility]) urologist-  dr ottelin/  oncologist-- dr patrcia   dx 05-27-2018--- Stage T1c,  Gleason 4+3,  PSA 9.3--  scheduled for brachytherapy 09-23-2017   Renal cyst, left     Past Surgical History:  Procedure Laterality Date   BIOPSY  12/18/2021   Procedure: BIOPSY;  Surgeon: Teressa,  Toribio SQUIBB, MD;  Location: [facility];  Service: Gastroenterology;;   CARDIOVASCULAR STRESS TEST  10/09/09   normal nuclearr stress test, EF 57% [facility])   CENTRAL LINE INSERTION  06/16/2021   Procedure: CENTRAL LINE INSERTION;  Surgeon: Cherrie Toribio SAUNDERS, MD;  Location: [facility];  Service: Cardiovascular;;   ESOPHAGOGASTRODUODENOSCOPY (EGD) WITH PROPOFOL  N/A 12/18/2021   Procedure: ESOPHAGOGASTRODUODENOSCOPY (EGD) WITH PROPOFOL ;  Surgeon: Teressa Toribio SQUIBB, MD;  Location: [facility];  Service: Gastroenterology;  Laterality: N/A;   IR GASTROSTOMY TUBE MOD SED  01/13/2017   IR GASTROSTOMY TUBE REMOVAL  02/03/2018   IR PATIENT EVAL TECH 0-60 MINS  03/25/2017   IR REMOVAL TUN ACCESS W/ PORT W/O FL MOD SED  01/15/2017   IR REPLACE G-TUBE SIMPLE WO FLUORO  01/13/2018   LARYNGOSCOPY N/A 09/15/2013   Procedure: LARYNGOSCOPY;  Surgeon: Vaughan Ricker, MD;  Location: [facility] OR;  Service: ENT;  Laterality: N/A;  direct laryngoscopy with biopsy and esophagoscopy   RADIOACTIVE SEED IMPLANT N/A  09/23/2018   Procedure: RADIOACTIVE SEED IMPLANT/BRACHYTHERAPY IMPLANT;  Surgeon: Ottelin, Mark, MD;  Location: [facility];  Service: Urology;  Laterality: N/A;   RIGHT HEART CATH N/A 06/16/2021   Procedure: RIGHT HEART CATH;  Surgeon: Cherrie Toribio SAUNDERS, MD;  Location: [facility];  Service: Cardiovascular;  Laterality: N/A;   RIGHT/LEFT HEART CATH AND CORONARY ANGIOGRAPHY N/A 01/05/2017   Procedure: Right/Left Heart Cath and Coronary Angiography;  Surgeon: [name] Lonni BIRCH, MD;  Location: [facility]VAS[facility] CV LAB;  Service: Cardiovascular;  Laterality: N/A;   TRANSTHORACIC ECHOCARDIOGRAM  05/06/2017   ef 45-50%,  grade 1 diastolic dysfunction/  AV severe calcified non coronary cusp with moderate regurg. , no stenosis (valve area per echo 01-05-2017 1.08cm^2)/  mild MR, TR, and PR/  mild LAE    Social History[1]  Family History  Problem Relation Age of Onset   Breast cancer Mother    Prostate cancer Neg Hx    Colon cancer Neg Hx    Pancreatic cancer Neg Hx     Allergies[2]  Medication list has been reviewed and updated.  Medications Ordered Prior to Encounter[3]  Review of Systems:  As per HPI- otherwise negative.  Pulse Readings from Last 3 Encounters:  09/04/24 98  08/09/24 89  05/30/24 79    Physical Examination: Vitals:   09/04/24 1018 09/04/24 1042  BP: 112/64   Pulse: (!) 108 98  SpO2: 90% 95%   Vitals:   09/04/24 1018  Weight: 140 lb (63.5 kg)  Height: 5' 6 (1.676 m)   Body mass index is 22.6 kg/m. Ideal Body Weight: Weight in (lb) to have BMI = 25: 154.6  GEN: no acute distress.  Appears his normal self, he has a hoarse and labored voice due to history of esophageal cancer HEENT: Atraumatic, Normocephalic.  Ears and Nose: No external deformity. CV: RRR, No M/G/R. No JVD. No thrill. No extra heart sounds. PULM: CTA B, no wheezes, crackles, rhonchi. No retractions. No resp. distress. No accessory muscle use. ABD: S, NT, ND, +BS.  No rebound. No HSM. EXTR: No c/c/e PSYCH: Normally interactive. Conversant.  DRE reveals a normal nontender prostate, Hemoccult stool was positive although there is no bright red blood  Assessment and Plan: Black stools - Plan: CBC, Fecal occult blood, imunochemical, Comprehensive metabolic panel with GFR, Ferritin  Urinary frequency - Plan: Urine Culture, POCT urinalysis dipstick  Glycosuria  Chronic obstructive pulmonary disease, unspecified COPD type [facility])  Assessment & Plan  Melena Recent gastrointestinal bleeding confirmed by positive fecal occult blood test. Concern for significant blood loss. - Ordered blood tests to assess hemoglobin and hematocrit levels. - Advised to seek immediate medical attention if melena recurs.  Lower urinary tract symptoms Intermittent slow urinary stream, mild discomfort, increased frequency, especially nocturia. No hematuria. - Sent urine sample for laboratory evaluation.  Chronic obstructive pulmonary disease Increased respiratory rate reported, but no acute exacerbation.  Signed Harlene Schroeder, MD  Received labs   Results for orders placed or performed in visit on 09/04/24  POCT urinalysis dipstick   Collection Time: 09/04/24 10:42 AM  Result Value Ref Range   Color, UA yellow yellow   Clarity, UA clear clear   Glucose, UA >=1,000 (A) negative mg/dL   Bilirubin, UA negative negative   Ketones, POC UA negative negative mg/dL   Spec Grav, UA <=8.994 (A) 1.010 - 1.025   Blood, UA negative negative   pH, UA 6.0 5.0 - 8.0   Protein Ur, POC negative negative mg/dL   Urobilinogen, UA 0.2 0.2 or 1.0 E.U./dL   Nitrite, UA Negative Negative   Leukocytes, UA Negative Negative  CBC   Collection Time: 09/04/24 10:54 AM  Result Value Ref Range   WBC 4.3 4.0 - 10.5 K/uL   RBC 4.39 4.22 - 5.81 Mil/uL   Platelets 326.0 150.0 - 400.0 K/uL   Hemoglobin 11.6 (L) 13.0 - 17.0 g/dL   HCT 63.7 (L) 60.9 - 47.9 %   MCV 82.5 78.0 - 100.0 fl   MCHC  31.9 30.0 - 36.0 g/dL   RDW 83.5 (H) 88.4 - 84.4 %  Comprehensive metabolic panel with GFR   Collection Time: 09/04/24 10:54 AM  Result Value Ref Range   Sodium 134 (L) 135 - 145 mEq/L   Potassium 4.1 3.5 - 5.1 mEq/L   Chloride 91 (L) 96 - 112 mEq/L   CO2 36 (H) 19 - 32 mEq/L   Glucose, Bld 111 (H) 70 - 99 mg/dL   BUN 18 6 - 23 mg/dL   Creatinine, Ser 8.79 0.40 - 1.50 mg/dL   Total Bilirubin 0.3 0.2 - 1.2 mg/dL   Alkaline Phosphatase 35 (L) 39 - 117 U/L   AST 13 5 - 37 U/L   ALT 8 3 - 53 U/L   Total Protein 6.8 6.0 - 8.3 g/dL   Albumin 3.9 3.5 - 5.2 g/dL   GFR 40.11 (L) >39.99 mL/min   Calcium  9.2 8.4 - 10.5 mg/dL  Ferritin   Collection Time: 09/04/24 10:54 AM  Result Value Ref Range   Ferritin 23.8 22.0 - 322.0 ng/mL  Hemoglobin A1c   Collection Time: 09/04/24 10:54 AM  Result Value Ref Range   Hgb A1c MFr Bld 5.6 4.6 - 6.5 %   *Note: Due to a large number of results and/or encounters for the requested time period, some results have not been displayed. A complete set of results can be found in Results Review.  Hemoglobin in September was 12.8.  June of last year hemoglobin as high as 13.6 Ferritin also checked in September, 39 Ferritin in June of last year was 314  I called number listed and reached his daughter Dwayne and discussed situation and plans with her.  She also administers the patient's MyChart so I will send her a message there so details will be written down for her. I have reached out to Drs. Bensimhon and Ennever for any other recommendations they may have- ?  Should we hold Eliquis  for  a period of time  I will also need to get him set up with a new gastroenterologist as he previously was a patient of Dr. Teressa who is retired    [1]  Social History Tobacco Use   Smoking status: Former    Current packs/day: 0.00    Average packs/day: 1 pack/day for 48.8 years (48.8 ttl pk-yrs)    Types: Cigarettes    Start date: 11/08/1960    Quit date: 08/31/2009    Years  since quitting: 15.0   Smokeless tobacco: Former    Types: Chew    Quit date: 09/01/1999  Vaping Use   Vaping status: Never Used  Substance Use Topics   Alcohol  use: Not Currently    Alcohol /week: 0.0 standard drinks of alcohol     Comment: quit in 2001   Drug use: No    Comment: back in the day used cocaine,alcohol , marijuana  [2] No Known Allergies [3]  Current Outpatient Medications on File Prior to Visit  Medication Sig Dispense Refill   albuterol  (VENTOLIN  HFA) 108 (90 Base) MCG/ACT inhaler Inhale 2 puffs into the lungs every 6 (six) hours as needed for wheezing or shortness of breath. 18 g 5   apixaban  (ELIQUIS ) 5 MG TABS tablet TAKE 1 TABLET(5 MG) BY MOUTH TWICE DAILY 180 tablet 3   dapagliflozin  propanediol (FARXIGA ) 10 MG TABS tablet TAKE 1 TABLET(10 MG) BY MOUTH DAILY 90 tablet 3   Evolocumab  (REPATHA  SURECLICK) 140 MG/ML SOAJ Inject 140 mg into the skin every 14 (fourteen) days. Please keep your upcoming appointment for refills. 6 mL 3   furosemide  (LASIX ) 40 MG tablet Take 2 tablets (80 mg total) by mouth daily. (Patient taking differently: Take 40 mg by mouth daily.)     ipratropium (ATROVENT ) 0.03 % nasal spray Place 2 sprays into both nostrils every 12 (twelve) hours. 30 mL 12   polyethylene glycol (MIRALAX  / GLYCOLAX ) 17 g packet Take 17 g by mouth daily as needed for mild constipation. 14 each 0   potassium chloride  SA (KLOR-CON  M) 20 MEQ tablet Take 1 tablet (20 mEq total) by mouth daily. 30 tablet 3   pravastatin  (PRAVACHOL ) 80 MG tablet Take 1 tablet (80 mg total) by mouth every evening. Needs appt 30 tablet 0   sacubitril -valsartan  (ENTRESTO ) 24-26 MG Take 1 tablet by mouth 2 (two) times daily. 60 tablet 6   spironolactone  (ALDACTONE ) 25 MG tablet Take 0.5 tablets (12.5 mg total) by mouth daily. 45 tablet 3   Current Facility-Administered Medications on File Prior to Visit  Medication Dose Route Frequency Provider Last Rate Last Admin   topical emolient (BIAFINE)  emulsion   Topical Daily Dewey Rush, MD   Given at 01/19/14 [phone]   "

## 2024-09-04 ENCOUNTER — Encounter: Payer: Self-pay | Admitting: Family Medicine

## 2024-09-04 ENCOUNTER — Ambulatory Visit: Admitting: Family Medicine

## 2024-09-04 VITALS — BP 112/64 | HR 98 | Ht 66.0 in | Wt 140.0 lb

## 2024-09-04 DIAGNOSIS — R81 Glycosuria: Secondary | ICD-10-CM

## 2024-09-04 DIAGNOSIS — J449 Chronic obstructive pulmonary disease, unspecified: Secondary | ICD-10-CM | POA: Diagnosis not present

## 2024-09-04 DIAGNOSIS — R350 Frequency of micturition: Secondary | ICD-10-CM | POA: Diagnosis not present

## 2024-09-04 DIAGNOSIS — K921 Melena: Secondary | ICD-10-CM

## 2024-09-04 LAB — COMPREHENSIVE METABOLIC PANEL WITH GFR
ALT: 8 U/L (ref 3–53)
AST: 13 U/L (ref 5–37)
Albumin: 3.9 g/dL (ref 3.5–5.2)
Alkaline Phosphatase: 35 U/L — ABNORMAL LOW (ref 39–117)
BUN: 18 mg/dL (ref 6–23)
CO2: 36 meq/L — ABNORMAL HIGH (ref 19–32)
Calcium: 9.2 mg/dL (ref 8.4–10.5)
Chloride: 91 meq/L — ABNORMAL LOW (ref 96–112)
Creatinine, Ser: 1.2 mg/dL (ref 0.40–1.50)
GFR: 59.88 mL/min — ABNORMAL LOW
Glucose, Bld: 111 mg/dL — ABNORMAL HIGH (ref 70–99)
Potassium: 4.1 meq/L (ref 3.5–5.1)
Sodium: 134 meq/L — ABNORMAL LOW (ref 135–145)
Total Bilirubin: 0.3 mg/dL (ref 0.2–1.2)
Total Protein: 6.8 g/dL (ref 6.0–8.3)

## 2024-09-04 LAB — CBC
HCT: 36.2 % — ABNORMAL LOW (ref 39.0–52.0)
Hemoglobin: 11.6 g/dL — ABNORMAL LOW (ref 13.0–17.0)
MCHC: 31.9 g/dL (ref 30.0–36.0)
MCV: 82.5 fl (ref 78.0–100.0)
Platelets: 326 K/uL (ref 150.0–400.0)
RBC: 4.39 Mil/uL (ref 4.22–5.81)
RDW: 16.4 % — ABNORMAL HIGH (ref 11.5–15.5)
WBC: 4.3 K/uL (ref 4.0–10.5)

## 2024-09-04 LAB — POCT URINALYSIS DIP (MANUAL ENTRY)
Bilirubin, UA: NEGATIVE
Blood, UA: NEGATIVE
Glucose, UA: >=1000 mg/dL — AB
Ketones, POC UA: NEGATIVE mg/dL
Leukocytes, UA: NEGATIVE
Nitrite, UA: NEGATIVE
Protein Ur, POC: NEGATIVE mg/dL
Spec Grav, UA: <=1.005 — AB
Urobilinogen, UA: 0.2 U/dL
pH, UA: 6

## 2024-09-04 LAB — HEMOGLOBIN A1C: Hgb A1c MFr Bld: 5.6 % (ref 4.6–6.5)

## 2024-09-04 LAB — FERRITIN: Ferritin: 23.8 ng/mL (ref 22.0–322.0)

## 2024-09-04 MED ORDER — PANTOPRAZOLE SODIUM 40 MG PO TBEC: 40.0000 mg | DELAYED_RELEASE_TABLET | Freq: Every day | ORAL | 3 refills | Status: AC

## 2024-09-05 LAB — URINE CULTURE
MICRO NUMBER:: 17425568
Result:: NO GROWTH
SPECIMEN QUALITY:: ADEQUATE

## 2024-09-05 NOTE — Telephone Encounter (Signed)
-----   Message from Harlene Schroeder, MD sent at 09/04/2024  6:26 PM EST ----- Call [facility] and set up a GI appointment

## 2024-09-06 ENCOUNTER — Encounter: Payer: Self-pay | Admitting: Family Medicine

## 2024-09-08 ENCOUNTER — Other Ambulatory Visit: Payer: Self-pay | Admitting: Family Medicine

## 2024-09-08 ENCOUNTER — Telehealth: Payer: Self-pay

## 2024-09-08 ENCOUNTER — Other Ambulatory Visit

## 2024-09-08 ENCOUNTER — Encounter: Payer: Self-pay | Admitting: Hematology & Oncology

## 2024-09-08 ENCOUNTER — Encounter: Payer: Self-pay | Admitting: Gastroenterology

## 2024-09-08 ENCOUNTER — Ambulatory Visit: Admitting: Gastroenterology

## 2024-09-08 VITALS — BP 90/60 | HR 96 | Ht 62.75 in | Wt 135.2 lb

## 2024-09-08 DIAGNOSIS — R634 Abnormal weight loss: Secondary | ICD-10-CM | POA: Diagnosis not present

## 2024-09-08 DIAGNOSIS — D649 Anemia, unspecified: Secondary | ICD-10-CM

## 2024-09-08 DIAGNOSIS — E7800 Pure hypercholesterolemia, unspecified: Secondary | ICD-10-CM

## 2024-09-08 DIAGNOSIS — K921 Melena: Secondary | ICD-10-CM | POA: Diagnosis not present

## 2024-09-08 DIAGNOSIS — R195 Other fecal abnormalities: Secondary | ICD-10-CM

## 2024-09-08 LAB — HEMATOCRIT: HCT: 37.7 % — ABNORMAL LOW (ref 39.0–52.0)

## 2024-09-08 LAB — HEMOGLOBIN: Hemoglobin: 11.9 g/dL — ABNORMAL LOW (ref 13.0–17.0)

## 2024-09-08 NOTE — Progress Notes (Signed)
 "  Chief Complaint:melena, positive fecal occult Primary GI Doctor:(previously Dr. Teressa) Dr. San  HPI:  Patient is a  74  year old male patient with past medical history of IDA, chronic combined systolic and diastolic CHF/ ischemic CM, severe multivessel CAD treated medically, h/o esophageal and piriform sinus cancer treated w/ chemo + radiation, prostate cancer, stage IIIa CKD, COPD, HTN and HLD, who was referred to me by Copland, Harlene BROCKS, MD on 09/05/23 for a evaluation of melena, positive fecal occult .    03/13/2024 patient seen by cardiology, reviewed entire note.   Interval History Patient presents for evaluation of melena and threw up coffee grounds, one episode.  Patient reports 2 weeks ago he had 1 episode of dark tarry stools followed by throwing up with dark black emesis. He reports it happened once. He reports he took a dose of OTC Pepto and symptoms resolved. No exposure or recent illness. No travel. He reports he is back to regular brown stools. He went to see his PCP who did stool test and found blood in stool. Not taking any iron  supplements. He reports intermittent lightheadedness, not new symptom. Patient was having some stomach discomfort and started on Pantoprazole  40 mg po daily. Denies dysphagia. Appetite has decreased, has lost about 9lbs since first of December.   Patient is on Eliquis  5 mg twice daily.    Wt Readings from Last 3 Encounters:  09/08/24 135 lb 4 oz (61.3 kg)  09/04/24 140 lb (63.5 kg)  08/09/24 144 lb 3.2 oz (65.4 kg)     Past Medical History:  Diagnosis Date   Chronic combined systolic and diastolic CHF (congestive heart failure) (HCC)    followed by dr t. raford   COPD (chronic obstructive pulmonary disease) (HCC)    Coronary artery disease cardiologist-- dr annabella raford   per cardiac cath 01-05-2017  chronic total occlusion pLCx with collaterals, 99% severe calcified prox. to mid RCA, otherwise mild to moderate CAD (medically  managed)   Esophageal cancer, stage IIIB [facility]) oncologist-  dr ennever/  dr moody   dx 2010  SCC Stage IIIB completed chemoradiation;  localized recurrent left piriform sinus 01/ 2015,  completed concurrent chemoradiatoin 04/ 2015   GERD (gastroesophageal reflux disease)    09-07-2018   no issues since Gtube removed 06/ 2019   History of alcohol  abuse    quit 2001   History of cancer chemotherapy    2010;   2015   History of radiation therapy    10-19-2013 to  12-05-2013 pyriform sinus 69.96 Gy/103fx;   Radiation completed 2010 for esophageal cancer   History of seizure 2001   alcohol  withdrawal   HOH (hard of hearing)    Hyperlipidemia    Hypertension    Iron  deficiency anemia due to chronic blood loss 11/14/2021   Ischemic cardiomyopathy 12/2016   01-03-2017  echo,  ef 10-15%/   echo 05-06-2017 EF improved to 45-50%   Prostate cancer [facility]) urologist-  dr ottelin/  oncologist-- dr patrcia   dx 05-27-2018--- Stage T1c,  Gleason 4+3,  PSA 9.3--  scheduled for brachytherapy 09-23-2017   Renal cyst, left     Past Surgical History:  Procedure Laterality Date   BIOPSY  12/18/2021   Procedure: BIOPSY;  Surgeon: Teressa Toribio SQUIBB, MD;  Location: [facility];  Service: Gastroenterology;;   CARDIOVASCULAR STRESS TEST  10/09/09   normal nuclearr stress test, EF 57% [facility]ell First)   CENTRAL LINE INSERTION  06/16/2021   Procedure: CENTRAL  LINE INSERTION;  Surgeon: Cherrie Toribio SAUNDERS, MD;  Location: [facility];  Service: Cardiovascular;;   ESOPHAGOGASTRODUODENOSCOPY (EGD) WITH PROPOFOL  N/A 12/18/2021   Procedure: ESOPHAGOGASTRODUODENOSCOPY (EGD) WITH PROPOFOL ;  Surgeon: Teressa Toribio SQUIBB, MD;  Location: [facility];  Service: Gastroenterology;  Laterality: N/A;   IR GASTROSTOMY TUBE MOD SED  01/13/2017   IR GASTROSTOMY TUBE REMOVAL  02/03/2018   IR PATIENT EVAL TECH 0-60 MINS  03/25/2017   IR REMOVAL TUN ACCESS W/ PORT W/O FL MOD SED  01/15/2017   IR REPLACE G-TUBE SIMPLE WO FLUORO  01/13/2018    LARYNGOSCOPY N/A 09/15/2013   Procedure: LARYNGOSCOPY;  Surgeon: Vaughan Ricker, MD;  Location: [facility];  Service: ENT;  Laterality: N/A;  direct laryngoscopy with biopsy and esophagoscopy   RADIOACTIVE SEED IMPLANT N/A 09/23/2018   Procedure: RADIOACTIVE SEED IMPLANT/BRACHYTHERAPY IMPLANT;  Surgeon: Ottelin, Mark, MD;  Location: [facility] ;  Service: Urology;  Laterality: N/A;   RIGHT HEART CATH N/A 06/16/2021   Procedure: RIGHT HEART CATH;  Surgeon: Cherrie Toribio SAUNDERS, MD;  Location: [facility];  Service: Cardiovascular;  Laterality: N/A;   RIGHT/LEFT HEART CATH AND CORONARY ANGIOGRAPHY N/A 01/05/2017   Procedure: Right/Left Heart Cath and Coronary Angiography;  Surgeon: [name] Lonni BIRCH, MD;  Location: [facility] INVASIVE CV LAB;  Service: Cardiovascular;  Laterality: N/A;   TRANSTHORACIC ECHOCARDIOGRAM  05/06/2017   ef 45-50%,  grade 1 diastolic dysfunction/  AV severe calcified non coronary cusp with moderate regurg. , no stenosis (valve area per echo 01-05-2017 1.08cm^2)/  mild MR, TR, and PR/  mild LAE    Current Outpatient Medications  Medication Sig Dispense Refill   albuterol  (VENTOLIN  HFA) 108 (90 Base) MCG/ACT inhaler Inhale 2 puffs into the lungs every 6 (six) hours as needed for wheezing or shortness of breath. 18 g 5   apixaban  (ELIQUIS ) 5 MG TABS tablet TAKE 1 TABLET(5 MG) BY MOUTH TWICE DAILY 180 tablet 3   dapagliflozin  propanediol (FARXIGA ) 10 MG TABS tablet TAKE 1 TABLET(10 MG) BY MOUTH DAILY 90 tablet 3   Evolocumab  (REPATHA  SURECLICK) 140 MG/ML SOAJ Inject 140 mg into the skin every 14 (fourteen) days. Please keep your upcoming appointment for refills. 6 mL 3   furosemide  (LASIX ) 40 MG tablet Take 2 tablets (80 mg total) by mouth daily. (Patient taking differently: Take 40 mg by mouth daily.)     ipratropium (ATROVENT ) 0.03 % nasal spray Place 2 sprays into both nostrils every 12 (twelve) hours. 30 mL 12   pantoprazole  (PROTONIX ) 40 MG tablet Take 1 tablet  (40 mg total) by mouth daily. 30 tablet 3   polyethylene glycol (MIRALAX  / GLYCOLAX ) 17 g packet Take 17 g by mouth daily as needed for mild constipation. 14 each 0   potassium chloride  SA (KLOR-CON  M) 20 MEQ tablet Take 1 tablet (20 mEq total) by mouth daily. 30 tablet 3   pravastatin  (PRAVACHOL ) 80 MG tablet Take 1 tablet (80 mg total) by mouth every evening. Needs appt 30 tablet 0   sacubitril -valsartan  (ENTRESTO ) 24-26 MG Take 1 tablet by mouth 2 (two) times daily. 60 tablet 6   spironolactone  (ALDACTONE ) 25 MG tablet Take 0.5 tablets (12.5 mg total) by mouth daily. 45 tablet 3   No current facility-administered medications for this visit.   Facility-Administered Medications Ordered in Other Visits  Medication Dose Route Frequency Provider Last Rate Last Admin   topical emolient (BIAFINE) emulsion   Topical Daily Dewey Rush, MD   Given at 01/19/14 [phone]  Allergies as of 09/08/2024   (No Known Allergies)    Family History  Problem Relation Age of Onset   Breast cancer Mother    Prostate cancer Neg Hx    Colon cancer Neg Hx    Pancreatic cancer Neg Hx     Review of Systems:    Constitutional: No weight loss, fever, chills, weakness or fatigue HEENT: Eyes: No change in vision               Ears, Nose, Throat:  No change in hearing or congestion Skin: No rash or itching Cardiovascular: No chest pain, chest pressure or palpitations   Respiratory: No SOB or cough Gastrointestinal: See HPI and otherwise negative Genitourinary: No dysuria or change in urinary frequency Neurological: No headache, dizziness or syncope Musculoskeletal: No new muscle or joint pain Hematologic: No bleeding or bruising Psychiatric: No history of depression or anxiety    Physical Exam:  Vital signs: BP 90/60 (BP Location: Left Arm, Patient Position: Sitting, Cuff Size: Normal)   Pulse 96 Comment: irregular  Ht 5' 2.75 (1.594 m) Comment: height measured without shoes  Wt 135 lb 4 oz (61.3 kg)    BMI 24.15 kg/m   Constitutional:   Pleasant male appears to be in NAD, Well developed, Well nourished, alert and cooperative Eyes:   PEERL, EOMI. No icterus. Conjunctiva pink. Neck:  Supple Throat: Oral cavity and pharynx without inflammation, swelling or lesion.  Respiratory: Respirations even and unlabored. Lungs clear to auscultation bilaterally.   No wheezes, crackles, or rhonchi.  Cardiovascular: Normal S1, S2. Regular rate and rhythm. No peripheral edema, cyanosis or pallor.  Gastrointestinal:  Soft, nondistended, nontender. No rebound or guarding. Normal bowel sounds. No appreciable masses or hepatomegaly. Rectal:  Not performed.  Msk:  Symmetrical without gross deformities. Without edema, no deformity or joint abnormality.  Neurologic:  Alert and  oriented x4;  grossly normal neurologically.  Skin:   Dry and intact without significant lesions or rashes.  RELEVANT LABS AND IMAGING: CBC    Latest Ref Rng & Units 09/04/2024   10:54 AM 05/04/2024    2:35 PM 02/03/2024    1:25 PM  CBC  WBC 4.0 - 10.5 K/uL 4.3  7.8  4.7   Hemoglobin 13.0 - 17.0 g/dL 88.3  87.1  86.3   Hematocrit 39.0 - 52.0 % 36.2  41.7  45.4   Platelets 150.0 - 400.0 K/uL 326.0  218  192      CMP     Latest Ref Rng & Units 09/04/2024   10:54 AM 05/04/2024    2:35 PM 03/22/2024    1:38 PM  CMP  Glucose 70 - 99 mg/dL 888  891  849   BUN 6 - 23 mg/dL 18  25  22    Creatinine 0.40 - 1.50 mg/dL 8.79  8.60  8.62   Sodium 135 - 145 mEq/L 134  134  134   Potassium 3.5 - 5.1 mEq/L 4.1  4.4  4.1   Chloride 96 - 112 mEq/L 91  93  92   CO2 19 - 32 mEq/L 36  30  30   Calcium  8.4 - 10.5 mg/dL 9.2  9.8  9.7   Total Protein 6.0 - 8.3 g/dL 6.8  7.5    Total Bilirubin 0.2 - 1.2 mg/dL 0.3  0.3    Alkaline Phos 39 - 117 U/L 35  45    AST 5 - 37 U/L 13  23    ALT 3 -  53 U/L 8  10       Lab Results  Component Value Date   TSH 2.650 05/04/2024   Lab Results  Component Value Date   IRON  38 (L) 05/04/2024   TIBC 416  05/04/2024   FERRITIN 23.8 09/04/2024    7/25 echo- Left ventricular ejection fraction, by estimation, is 30 to 35%.   GI procedures: 11/2021 EGD - Tortuous UES, without strictures. - Slightly irregular Z line without nodularity above a small hiatal hernia. - Mild, non- specific distal gastritis. Biopsied to check for H. pylori. - The examination was otherwise normal. Path: A.   STOMACH, ANTRUM, BIOPSY:  -    Chronic gastritis with intestinal metaplasia and gland atrophy,  involving antral-type mucosa (environmental metaplastic atrophic  gastritis).  -   Helicobacter pylori immunohistochemical (IHC) stain pending, to be  reported in an addendum.   ADDENDUM: Immunohistochemistry for Helicobacter pylori is negative.   01/2020 colonoscopy, recall 7 years  - Diverticulosis in the entire examined colon. - One 3 mm polyp in the ascending colon, removed with a cold snare. Resected and retrieved. - The examination was otherwise normal on direct and retroflexion views. - Biopsies were taken with a cold forceps from the entire colon for evaluation of microscopic colitis. Path: Diagnosis 1. Surgical [P], colon, random sites - FOCAL ACTIVE COLITIS - NO GRANULOMATA, DYSPLASIA OR MALIGNANCY IDENTIFIED 2. Surgical [P], colon, ascending, polyp - TUBULAR ADENOMA (1 OF 2 FRAGMENTS) - BENIGN COLONIC MUCOSA (1 OF 2 FRAGMENTS) - NO HIGH GRADE DYSPLASIA OR MALIGNANCY IDENTIFIED   Assessment: Encounter Diagnoses  Name Primary?   Anemia, unspecified type Yes   Melena    Positive fecal occult blood test    Loss of weight    #1 melena, on Elquis,  heme +, Hgb 11.6. EGD 4/23 with gastritis, neg bx. Neg H pylori. -Continue Pantoprazole  40 mg po daily  #3  History of a colon tubular adenoma June 2021. Needs follow up colonoscopy June 2028       #4 hx of  PE , diagnosed October 2022.  -Patient on Eliquis  5 mg twice daily #5 Remote history of esophageal cancer s/p chemotherapy and radiation in 2010. Followed by  Dr. Timmy  #6 very hard of hearing  Plan: -hemoglobin/hematocrit today -Continue Pantoprazole  40 mg po daily -Avoid NSAIDs -sent message to Norleen Schillings, CRNA, any procedures will need to be done in hospital due to cardiac history. -Advised to go to the ER if there is any severe weakness, severe abdominal pain, vomit blood, dark red blood in your bowel movement, shortness of breath or chest pain.    Thank you for the courtesy of this consult. Please call me with any questions or concerns.   Paisyn Guercio, FNP-C [facility] 09/08/2024, 2:39 PM  Cc: Copland, Harlene BROCKS, MD  "

## 2024-09-08 NOTE — Patient Instructions (Addendum)
 Anemia -Continue Pantoprazole  40 mg po daily -Avoid NSAIDs (ibuprofen, motrin, aleve) -We will be setting you up for EGD endoscopy to evaluate for source of bleeding, pending clearance from cardiology and anesthesiology.    -Advised to go to the ER if there is any severe weakness, severe abdominal pain, vomit blood, dark red blood in your bowel movement, shortness of breath or chest pain.    Your provider has requested that you go to the basement level for lab work before leaving today. Press B on the elevator. The lab is located at the first door on the left as you exit the elevator.  _______________________________________________________  If your blood pressure at your visit was 140/90 or greater, please contact your primary care physician to follow up on this.  _______________________________________________________  If you are age 66 or older, your body mass index should be between 23-30. Your Body mass index is 24.15 kg/m. If this is out of the aforementioned range listed, please consider follow up with your Primary Care Provider.  If you are age 51 or younger, your body mass index should be between 19-25. Your Body mass index is 24.15 kg/m. If this is out of the aformentioned range listed, please consider follow up with your Primary Care Provider.   ________________________________________________________  The [facility] providers would like to encourage you to use MYCHART to communicate with providers for non-urgent requests or questions.  Due to long hold times on the telephone, sending your provider a message by [facility] may be a faster and more efficient way to get a response.  Please allow 48 business hours for a response.  Please remember that this is for non-urgent requests.  _______________________________________________________  [facility] is using a team-based approach to care.  Your team is made up of your doctor and two to three APPS. Our APPS (Nurse  Practitioners and Physician Assistants) work with your physician to ensure care continuity for you. They are fully qualified to address your health concerns and develop a treatment plan. They communicate directly with your gastroenterologist to care for you. Seeing the Advanced Practice Practitioners on your physician's team can help you by facilitating care more promptly, often allowing for earlier appointments, access to diagnostic testing, procedures, and other specialty referrals.   Thank you for trusting me with your gastrointestinal care. Deanna May, FNP-C

## 2024-09-08 NOTE — Telephone Encounter (Signed)
 Clinical pharmacist to review Eliquis .  Given history of significant ischemic cardiomyopathy, will need heart failure service evaluation before final clearance.  Scheduled to see heart failure service on 09/13/2024

## 2024-09-08 NOTE — Telephone Encounter (Signed)
 [facility] Pre-operative Risk Assessment     Request for surgical clearance:     Endoscopy Procedure  What type of surgery is being performed?     Endoscopic  When is this surgery scheduled?     TBD  What type of clearance is required ?   Pharmacy  Are there any medications that need to be held prior to surgery and how long? Eliquis  2 days  Practice name and name of physician performing surgery?      [facility]  What is your office phone and fax number?      Phone- [phone]  Fax- [phone]  Anesthesia type (None, local, MAC, general) ?       MAC   Please route your response to Karna Louder, RMA

## 2024-09-11 NOTE — Telephone Encounter (Signed)
 Patient with diagnosis of history of PE on Eliquis  for anticoagulation.    Procedure: endoscopy Date of procedure: TBD   CrCl 47 ml/min Platelet count 326k   Per office protocol, patient can hold Eliquis  for 2 days prior to procedure.    **This guidance is not considered finalized until pre-operative APP has relayed final recommendations.**

## 2024-09-11 NOTE — Telephone Encounter (Signed)
 Pharmacy has weighed in.  To be seen by CHF on 1/14 for clearance.   KL

## 2024-09-12 ENCOUNTER — Telehealth (HOSPITAL_COMMUNITY): Payer: Self-pay

## 2024-09-12 NOTE — Telephone Encounter (Signed)
 Called to confirm/remind patient of their appointment at [facility] on 09/13/24.   Appointment:   [] Confirmed  [x] Left mess   [] No answer/No voice mail  [] VM Full/unable to leave message  [] Phone not in service  And to bring in all medications and/or complete list.

## 2024-09-12 NOTE — Progress Notes (Signed)
 "  PCP: Copland, Harlene BROCKS, MD HF MD: Dr Cherrie  HPI: Collin Young is a 74 y.o. male w/ chronic combined systolic and diastolic CHF/ ischemic CM, severe multivessel CAD treated medically, 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     HF dates back to 12/2016. Echo then w/ severely reduced LVEF 10-15%, RV mild-mod reduced, mod AI/MR/TR. LHC showed severe calcific disease in the proximal to mid RCA and chronic total occlusion of the proximal circumflex.There was consideration of RCA PCI with recommendations for medical management.    Admitted  9/22 with HF. Echo EF down 25-30%, RV not well visualized. Mod MR/AI. Chest CT showed  large right pleural effusion.  There was questionable PE on his CT but ultimately this was thought to be just artifact with poor opacification. Given w/ IV Lasix  and discharged home on Entresto , Farxiga , Toprol  XL and PRN lasix .    Admitted 06/11/21 with HF and acute hypoxic respiratory failure.Complicated by PE, pleural effusion, and cardiogenic shock. Diuresed with IV lasix .  Limited Echo EF 20%, global HK, RV normal.  RHC cardiogenic shock with R>L heart failure. GDMT titrated. Not felt to be a VAD candidate due to cachexia and RV failure. Discharged home, weight 121 lbs.    Admitted 07/07/21 with HF. Echo EF 25-30%, severe LV dysfunction, grade I DD, RV mildly reduced. Diuresed with IV lasix  and transitioned to po lasix  40 mg daily. Continued on GDMT, discharge weight 122 lbs.   Echo 2/23: EF 40-45%  Echo 5/24 EF 35-40%, inferior AK mild MR  Admitted 2/25 with a/c HF. Diuresed with IV lasix  and transitioned to lasix  40 mg po daily. Cath films reviewed. No targets for PCI. Plan for medical management. GDMT titrated and discharged home, weight 117 lbs (Not sure this was accurate given previous day weight was 128 lbs).  Used Furoscix  x 2 doses 11/04/23.  Echo 03/13/24 EF 40-45% RV ok    Today he returns for HF follow up. Overall feeling fine. No SOB walking up steps. Occasional dizziness, no falls or syncope. Denies palpitations, abnormal bleeding, CP, edema, or PND/Orthopnea. Appetite ok. Weight at home 135-140 pounds. Taking all medications. Needs CV clearance for EGD   Cardiac Studies Echo 7/25: EF 40-45%, RV ok Echo 5/24 EF 35-40%, inferior AK mild MR Echo 2/23: EF 40-45% Echo 11/22: EF 25-30%, global HK, RV mildly reduced. Echo 10/22: EF 20%, global HK, RV mildly reduced Echo 9/21: EF down again, 35-40%. RV normal  Echo 9/18: EF improved up to 45-50%, Mod AI. RV normal.  Echo 2018: EF 15%.    - cMRI (12/22) unable to quanify EF, LGE with prior infarct basal to mid inferior/inferolateral walls. LGE is greater than 50% transmural suggesting territory is not viable  ROS: All systems negative except as listed in HPI, PMH and Problem List.  SH:  Social History   Socioeconomic History   Marital status: Legally Separated    Spouse name: Not on file   Number of children: 2   Years of education: Not on file   Highest education level: Not on file  Occupational History   Occupation: retired  Tobacco Use   Smoking status: Former    Current packs/day: 0.00    Average packs/day: 1 pack/day for 48.8 years (48.8 ttl pk-yrs)    Types: Cigarettes    Start date: 11/08/1960    Quit date: 08/31/2009    Years since quitting:  15.0   Smokeless tobacco: Former    Types: Chew    Quit date: 09/01/1999  Vaping Use   Vaping status: Never Used  Substance and Sexual Activity   Alcohol  use: Not Currently    Alcohol /week: 0.0 standard drinks of alcohol     Comment: quit in 2001   Drug use: No    Comment: back in the day used cocaine,alcohol , marijuana   Sexual activity: Not Currently    Partners: Female    Birth control/protection: None  Other Topics Concern   Not on file  Social History Narrative   Not on file   Social Drivers of Health   Tobacco Use: Medium Risk  (09/08/2024)   Patient History    Smoking Tobacco Use: Former    Smokeless Tobacco Use: Former    Passive Exposure: Not on Actuary Strain: Low Risk (07/20/2023)   Overall Financial Resource Strain (CARDIA)    Difficulty of Paying Living Expenses: Not hard at all  Food Insecurity: No Food Insecurity (11/01/2023)   Hunger Vital Sign    Worried About Running Out of Food in the Last Year: Never true    Ran Out of Food in the Last Year: Never true  Transportation Needs: No Transportation Needs (11/01/2023)   PRAPARE - Administrator, Civil Service (Medical): No    Lack of Transportation (Non-Medical): No  Physical Activity: Inactive (07/20/2023)   Exercise Vital Sign    Days of Exercise per Week: 0 days    Minutes of Exercise per Session: 0 min  Stress: No Stress Concern Present (07/20/2023)   [employer] of Occupational Health - Occupational Stress Questionnaire    Feeling of Stress : Not at all  Social Connections: Moderately Integrated (10/26/2023)   Social Connection and Isolation Panel    Frequency of Communication with Friends and Family: More than three times a week    Frequency of Social Gatherings with Friends and Family: More than three times a week    Attends Religious Services: More than 4 times per year    Active Member of [employer] or Organizations: Yes    Attends Banker Meetings: More than 4 times per year    Marital Status: Divorced  Intimate Partner Violence: Not At Risk (11/01/2023)   Humiliation, Afraid, Rape, and Kick questionnaire    Fear of Current or Ex-Partner: No    Emotionally Abused: No    Physically Abused: No    Sexually Abused: No  Depression (PHQ2-9): Low Risk (05/16/2024)   Depression (PHQ2-9)    PHQ-2 Score: 0  Alcohol  Screen: Low Risk (07/20/2023)   Alcohol  Screen    Last Alcohol  Screening Score (AUDIT): 0  Housing: Low Risk (11/01/2023)   Housing Stability Vital Sign    Unable to Pay for Housing in the Last  Year: No    Number of Times Moved in the Last Year: 0    Homeless in the Last Year: No  Utilities: Not At Risk (11/01/2023)   AHC Utilities    Threatened with loss of utilities: No  Health Literacy: Adequate Health Literacy (07/20/2023)   B1300 Health Literacy    Frequency of need for help with medical instructions: Rarely   FH:  Family History  Problem Relation Age of Onset   Breast cancer Mother    Prostate cancer Neg Hx    Colon cancer Neg Hx    Pancreatic cancer Neg Hx    Past Medical History:  Diagnosis Date  Chronic combined systolic and diastolic CHF (congestive heart failure) (HCC)    followed by dr t. raford   COPD (chronic obstructive pulmonary disease) (HCC)    Coronary artery disease cardiologist-- dr annabella raford   per cardiac cath 01-05-2017  chronic total occlusion pLCx with collaterals, 99% severe calcified prox. to mid RCA, otherwise mild to moderate CAD (medically managed)   Esophageal cancer, stage IIIB [facility]) oncologist-  dr ennever/  dr moody   dx 2010  SCC Stage IIIB completed chemoradiation;  localized recurrent left piriform sinus 01/ 2015,  completed concurrent chemoradiatoin 04/ 2015   GERD (gastroesophageal reflux disease)    09-07-2018   no issues since Gtube removed 06/ 2019   History of alcohol  abuse    quit 2001   History of cancer chemotherapy    2010;   2015   History of radiation therapy    10-19-2013 to  12-05-2013 pyriform sinus 69.96 Gy/58fx;   Radiation completed 2010 for esophageal cancer   History of seizure 2001   alcohol  withdrawal   HOH (hard of hearing)    Hyperlipidemia    Hypertension    Iron  deficiency anemia due to chronic blood loss 11/14/2021   Ischemic cardiomyopathy 12/2016   01-03-2017  echo,  ef 10-15%/   echo 05-06-2017 EF improved to 45-50%   Prostate cancer [facility]) urologist-  dr ottelin/  oncologist-- dr patrcia   dx 05-27-2018--- Stage T1c,  Gleason 4+3,  PSA 9.3--  scheduled for brachytherapy 09-23-2017   Renal  cyst, left    Current Outpatient Medications  Medication Sig Dispense Refill   albuterol  (VENTOLIN  HFA) 108 (90 Base) MCG/ACT inhaler Inhale 2 puffs into the lungs every 6 (six) hours as needed for wheezing or shortness of breath. 18 g 5   apixaban  (ELIQUIS ) 5 MG TABS tablet TAKE 1 TABLET(5 MG) BY MOUTH TWICE DAILY 180 tablet 3   dapagliflozin  propanediol (FARXIGA ) 10 MG TABS tablet TAKE 1 TABLET(10 MG) BY MOUTH DAILY 90 tablet 3   Evolocumab  (REPATHA  SURECLICK) 140 MG/ML SOAJ Inject 140 mg into the skin every 14 (fourteen) days. Please keep your upcoming appointment for refills. 6 mL 3   furosemide  (LASIX ) 40 MG tablet Take 2 tablets (80 mg total) by mouth daily.     ipratropium (ATROVENT ) 0.03 % nasal spray Place 2 sprays into both nostrils every 12 (twelve) hours. 30 mL 12   pantoprazole  (PROTONIX ) 40 MG tablet Take 1 tablet (40 mg total) by mouth daily. 30 tablet 3   polyethylene glycol (MIRALAX  / GLYCOLAX ) 17 g packet Take 17 g by mouth daily as needed for mild constipation. 14 each 0   potassium chloride  SA (KLOR-CON  M) 20 MEQ tablet Take 1 tablet (20 mEq total) by mouth daily. 30 tablet 3   pravastatin  (PRAVACHOL ) 80 MG tablet TAKE 1 TABLET(80 MG) BY MOUTH EVERY EVENING 90 tablet 3   sacubitril -valsartan  (ENTRESTO ) 24-26 MG Take 1 tablet by mouth 2 (two) times daily. 60 tablet 6   spironolactone  (ALDACTONE ) 25 MG tablet Take 0.5 tablets (12.5 mg total) by mouth daily. 45 tablet 3   No current facility-administered medications for this encounter.   Facility-Administered Medications Ordered in Other Encounters  Medication Dose Route Frequency Provider Last Rate Last Admin   topical emolient (BIAFINE) emulsion   Topical Daily Dewey Rush, MD   Given at 01/19/14 0912   Wt Readings from Last 3 Encounters:  09/13/24 61.4 kg (135 lb 6.4 oz)  09/08/24 61.3 kg (135 lb 4 oz)  09/04/24 63.5 kg (140 lb)   BP 100/68   Pulse 91   Wt 61.4 kg (135 lb 6.4 oz)   SpO2 96%   BMI 24.18 kg/m    PHYSICAL EXAM: General:  NAD. No resp difficulty, walked into clinic, thin HEENT: +hoarse Neck: Supple. No JVD. Cor: Regular rate & rhythm. No rubs, gallops or murmurs. Lungs: Clear Abdomen: Soft, nontender, nondistended.  Extremities: No cyanosis, clubbing, rash, edema Neuro: Alert & oriented x 3, moves all 4 extremities w/o difficulty. Affect pleasant.  ECG (personally reviewed): NSR with 2 PVCs  ASSESSMENT & PLAN: 1. Chronic Biventricular HF - EF remained 20-30% from 2021 to 2023, when it improved to 40-45%. - Echo (9/21): EF down again, 35-40%. RV normal  - Echo 2/25: EF down 20-25% RV moderately reduced + WMA. - Echo 03/13/24 EF 40-45% RV ok  - Stable NYHA II, volume looks OK today. - Continue Lasix  80 mg daily. - Continue spiro 12.5 mg daily. - Continue Entresto  24/26 mg bid. - Continue Farxiga  10 mg daily. - No b-blocker yet with recovered EF and low BP  - Labs reviewed 09/04/24 and are stable, K 4.1, creatinine 1.20 - Update echo next visit  2. HTN  - BP low/normal - Continue meds as above for now  3. CAD - Cath 2018 severe Prox -Mid CTO proximal circumflex. Mod left main and mid LAD.  - No chest pain - Continue pravastatin . - No ASA with Eliquis .    4. CKD Stage IIIa - Baseline SCr 1.2-1.3. - Continue Farxiga  - Most recent SCr 1.20  5. Hx of PE - On Eliquis  - no bleeding issues, last hgb 11.9  6. Pre-OP CV risk stratification - due for EGD, ok from CV standpoint - OK to hold Eliquis  2 days prior  Follow up in 6 months with Dr. Cherrie + echo  Harlene HERO [facility] FNP-BC 10:46 AM  "

## 2024-09-13 ENCOUNTER — Ambulatory Visit (HOSPITAL_COMMUNITY): Payer: Self-pay | Admitting: Family Medicine

## 2024-09-13 ENCOUNTER — Ambulatory Visit (HOSPITAL_COMMUNITY)
Admission: RE | Admit: 2024-09-13 | Discharge: 2024-09-13 | Disposition: A | Discharge status: Home / Self Care | Source: Ambulatory Visit | Attending: Family Medicine | Admitting: Family Medicine

## 2024-09-13 ENCOUNTER — Encounter (HOSPITAL_COMMUNITY): Payer: Self-pay

## 2024-09-13 VITALS — BP 100/68 | HR 91 | Wt 135.4 lb

## 2024-09-13 DIAGNOSIS — Z7901 Long term (current) use of anticoagulants: Secondary | ICD-10-CM | POA: Insufficient documentation

## 2024-09-13 DIAGNOSIS — N1831 Chronic kidney disease, stage 3a: Secondary | ICD-10-CM | POA: Insufficient documentation

## 2024-09-13 DIAGNOSIS — Z01818 Encounter for other preprocedural examination: Secondary | ICD-10-CM

## 2024-09-13 DIAGNOSIS — Z79899 Other long term (current) drug therapy: Secondary | ICD-10-CM | POA: Insufficient documentation

## 2024-09-13 DIAGNOSIS — E785 Hyperlipidemia, unspecified: Secondary | ICD-10-CM | POA: Diagnosis not present

## 2024-09-13 DIAGNOSIS — D631 Anemia in chronic kidney disease: Secondary | ICD-10-CM | POA: Insufficient documentation

## 2024-09-13 DIAGNOSIS — I130 Hypertensive heart and chronic kidney disease with heart failure and stage 1 through stage 4 chronic kidney disease, or unspecified chronic kidney disease: Secondary | ICD-10-CM | POA: Insufficient documentation

## 2024-09-13 DIAGNOSIS — I10 Essential (primary) hypertension: Secondary | ICD-10-CM

## 2024-09-13 DIAGNOSIS — J90 Pleural effusion, not elsewhere classified: Secondary | ICD-10-CM | POA: Insufficient documentation

## 2024-09-13 DIAGNOSIS — Z86711 Personal history of pulmonary embolism: Secondary | ICD-10-CM | POA: Insufficient documentation

## 2024-09-13 DIAGNOSIS — I2510 Atherosclerotic heart disease of native coronary artery without angina pectoris: Secondary | ICD-10-CM | POA: Diagnosis not present

## 2024-09-13 DIAGNOSIS — I5042 Chronic combined systolic (congestive) and diastolic (congestive) heart failure: Secondary | ICD-10-CM | POA: Insufficient documentation

## 2024-09-13 DIAGNOSIS — J449 Chronic obstructive pulmonary disease, unspecified: Secondary | ICD-10-CM | POA: Insufficient documentation

## 2024-09-13 DIAGNOSIS — I255 Ischemic cardiomyopathy: Secondary | ICD-10-CM | POA: Insufficient documentation

## 2024-09-13 DIAGNOSIS — I5022 Chronic systolic (congestive) heart failure: Secondary | ICD-10-CM

## 2024-09-13 DIAGNOSIS — Z7984 Long term (current) use of oral hypoglycemic drugs: Secondary | ICD-10-CM | POA: Insufficient documentation

## 2024-09-13 DIAGNOSIS — I5082 Biventricular heart failure: Secondary | ICD-10-CM | POA: Diagnosis not present

## 2024-09-13 DIAGNOSIS — Z87891 Personal history of nicotine dependence: Secondary | ICD-10-CM | POA: Diagnosis not present

## 2024-09-13 NOTE — Patient Instructions (Signed)
 No change in medications. Return to see Dr. Cherrie in 6 mos with echo.  PLEASE CALL [phone] END OF JUNE TO SCHEDULE THIS APPOINTMENT IF OUR OFFICE HAS NOT REACHED OUT TO SCHEDULE YOUR FOLLOW UP. Please call us  at [phone] if any questions or concerns prior to your next appointment.

## 2024-09-14 ENCOUNTER — Ambulatory Visit: Payer: Self-pay | Admitting: Gastroenterology

## 2024-09-19 ENCOUNTER — Telehealth: Payer: Self-pay

## 2024-09-19 NOTE — Telephone Encounter (Signed)
-----   Message from [facility], NP sent at 09/14/2024  7:27 AM EST ----- Bascom Rush CRNA said his procedure would have to be done at the hospital due to his heart history. He is on blood thinner. Please scheduled EGD with D.r Cirigliano. I recall patient tell me he didn't want to do colonoscopy. Please verify.  Thanks, Cathryne, NP

## 2024-09-20 ENCOUNTER — Other Ambulatory Visit: Payer: Self-pay

## 2024-09-20 DIAGNOSIS — K921 Melena: Secondary | ICD-10-CM

## 2024-09-20 DIAGNOSIS — R634 Abnormal weight loss: Secondary | ICD-10-CM

## 2024-10-04 ENCOUNTER — Other Ambulatory Visit (HOSPITAL_COMMUNITY): Payer: Self-pay | Admitting: Family Medicine

## 2024-10-04 DIAGNOSIS — I5022 Chronic systolic (congestive) heart failure: Secondary | ICD-10-CM

## 2024-10-30 ENCOUNTER — Encounter (HOSPITAL_COMMUNITY): Payer: Self-pay

## 2024-10-30 ENCOUNTER — Ambulatory Visit (HOSPITAL_COMMUNITY): Admit: 2024-10-30 | Admitting: Gastroenterology

## 2024-10-30 SURGERY — EGD (ESOPHAGOGASTRODUODENOSCOPY)
Anesthesia: Monitor Anesthesia Care

## 2024-11-01 ENCOUNTER — Inpatient Hospital Stay: Attending: Hematology & Oncology

## 2024-11-01 ENCOUNTER — Ambulatory Visit: Admitting: Hematology & Oncology

## 2025-02-14 ENCOUNTER — Ambulatory Visit (HOSPITAL_COMMUNITY)

## 2025-02-14 ENCOUNTER — Ambulatory Visit
